# Patient Record
Sex: Male | Born: 1970 | Race: White | Hispanic: No | Marital: Single | Smoking: Current every day smoker
Health system: Southern US, Community
[De-identification: ages and names within clinical notes are randomized; demographics above are authoritative.]

## PROBLEM LIST (undated history)

## (undated) DIAGNOSIS — I10 Essential (primary) hypertension: Secondary | ICD-10-CM

## (undated) DIAGNOSIS — J449 Chronic obstructive pulmonary disease, unspecified: Secondary | ICD-10-CM

## (undated) DIAGNOSIS — F419 Anxiety disorder, unspecified: Secondary | ICD-10-CM

## (undated) DIAGNOSIS — G4730 Sleep apnea, unspecified: Secondary | ICD-10-CM

## (undated) DIAGNOSIS — F259 Schizoaffective disorder, unspecified: Secondary | ICD-10-CM

## (undated) DIAGNOSIS — F329 Major depressive disorder, single episode, unspecified: Secondary | ICD-10-CM

## (undated) DIAGNOSIS — R188 Other ascites: Secondary | ICD-10-CM

## (undated) DIAGNOSIS — F32A Depression, unspecified: Secondary | ICD-10-CM

## (undated) DIAGNOSIS — F319 Bipolar disorder, unspecified: Secondary | ICD-10-CM

## (undated) DIAGNOSIS — K859 Acute pancreatitis without necrosis or infection, unspecified: Secondary | ICD-10-CM

## (undated) DIAGNOSIS — N400 Enlarged prostate without lower urinary tract symptoms: Secondary | ICD-10-CM

## (undated) DIAGNOSIS — E079 Disorder of thyroid, unspecified: Secondary | ICD-10-CM

## (undated) DIAGNOSIS — E785 Hyperlipidemia, unspecified: Secondary | ICD-10-CM

## (undated) HISTORY — DX: Essential (primary) hypertension: I10

## (undated) HISTORY — DX: Schizoaffective disorder, unspecified: F25.9

## (undated) HISTORY — DX: Acute pancreatitis without necrosis or infection, unspecified: K85.90

## (undated) HISTORY — DX: Anxiety disorder, unspecified: F41.9

## (undated) HISTORY — DX: Hyperlipidemia, unspecified: E78.5

## (undated) HISTORY — DX: Depression, unspecified: F32.A

## (undated) HISTORY — PX: CYST REMOVAL HAND: SHX6279

## (undated) HISTORY — DX: Major depressive disorder, single episode, unspecified: F32.9

## (undated) HISTORY — PX: UPPER GASTROINTESTINAL ENDOSCOPY: SHX188

## (undated) HISTORY — DX: Bipolar disorder, unspecified: F31.9

## (undated) HISTORY — PX: ERCP: SHX60

## (undated) HISTORY — DX: Other ascites: R18.8

## (undated) HISTORY — DX: Disorder of thyroid, unspecified: E07.9

---

## 2000-02-09 ENCOUNTER — Emergency Department (HOSPITAL_COMMUNITY): Admission: EM | Admit: 2000-02-09 | Discharge: 2000-02-09 | Payer: Self-pay | Admitting: Emergency Medicine

## 2000-02-09 ENCOUNTER — Encounter: Payer: Self-pay | Admitting: Emergency Medicine

## 2000-07-06 HISTORY — PX: HERNIA REPAIR: SHX51

## 2009-11-03 ENCOUNTER — Emergency Department (HOSPITAL_COMMUNITY): Admission: EM | Admit: 2009-11-03 | Discharge: 2009-11-04 | Payer: Self-pay | Admitting: Emergency Medicine

## 2010-09-23 LAB — URINALYSIS, ROUTINE W REFLEX MICROSCOPIC
Bilirubin Urine: NEGATIVE
Glucose, UA: NEGATIVE mg/dL
Hgb urine dipstick: NEGATIVE
Ketones, ur: NEGATIVE mg/dL
Nitrite: NEGATIVE
Protein, ur: NEGATIVE mg/dL
Specific Gravity, Urine: 1.034 — ABNORMAL HIGH (ref 1.005–1.030)
Urobilinogen, UA: 1 mg/dL (ref 0.0–1.0)
pH: 5 (ref 5.0–8.0)

## 2010-09-23 LAB — URINE CULTURE
Colony Count: NO GROWTH
Culture: NO GROWTH

## 2011-07-07 DIAGNOSIS — I10 Essential (primary) hypertension: Secondary | ICD-10-CM

## 2011-07-07 HISTORY — DX: Essential (primary) hypertension: I10

## 2012-08-24 ENCOUNTER — Encounter (HOSPITAL_COMMUNITY): Payer: Self-pay | Admitting: *Deleted

## 2012-08-24 ENCOUNTER — Emergency Department (HOSPITAL_COMMUNITY)
Admission: EM | Admit: 2012-08-24 | Discharge: 2012-08-24 | Disposition: A | Discharge status: Home / Self Care | Payer: Self-pay | Attending: Emergency Medicine | Admitting: Emergency Medicine

## 2012-08-24 DIAGNOSIS — R1909 Other intra-abdominal and pelvic swelling, mass and lump: Secondary | ICD-10-CM | POA: Insufficient documentation

## 2012-08-24 DIAGNOSIS — G8929 Other chronic pain: Secondary | ICD-10-CM | POA: Insufficient documentation

## 2012-08-24 DIAGNOSIS — F17200 Nicotine dependence, unspecified, uncomplicated: Secondary | ICD-10-CM | POA: Insufficient documentation

## 2012-08-24 NOTE — ED Notes (Addendum)
Groin pain and lump x 1 year. Was seen at [facility] earlier today and referred to general surgeon. Unsure of what was wrong per pt.

## 2012-08-25 ENCOUNTER — Ambulatory Visit (HOSPITAL_COMMUNITY)
Admit: 2012-08-25 | Discharge: 2012-08-25 | Disposition: A | Discharge status: Home / Self Care | Payer: Self-pay | Source: Ambulatory Visit | Attending: Emergency Medicine | Admitting: Emergency Medicine

## 2012-08-25 DIAGNOSIS — R1032 Left lower quadrant pain: Secondary | ICD-10-CM | POA: Insufficient documentation

## 2012-08-25 DIAGNOSIS — R1904 Left lower quadrant abdominal swelling, mass and lump: Secondary | ICD-10-CM | POA: Insufficient documentation

## 2012-08-25 DIAGNOSIS — R1909 Other intra-abdominal and pelvic swelling, mass and lump: Secondary | ICD-10-CM

## 2012-08-25 NOTE — ED Provider Notes (Signed)
US Pelvis Limited  08/25/2012  *RADIOLOGY REPORT*  Clinical Data: Left groin nodule with tenderness.  LIMITED ULTRASOUND OF PELVIC SOFT TISSUES  Technique: Ultrasound examination of the pelvic soft tissues was performed in the area of clinical concern.  Comparison:  None.  Findings: Oblong nodules with hyperechoic hila are seen in the left groin, measuring up to 9 x 4 x 5 mm.  IMPRESSION: Small left inguinal lymph nodes.   Original Report Authenticated By: Leanna Battles, M.D.     Pt returning after ED visit for [place]. Results as above. Discussed with pt and provided with copy of reports. Says symptoms have been stable since DC. No new complaints. Emergent return precautions discussed. Outpt FU otherwise.   Raeford Razor, MD 08/25/12 1320

## 2012-08-31 NOTE — ED Provider Notes (Signed)
History     CSN: [id number]  Arrival date & time 08/24/12  1804   First MD Initiated Contact with Patient 08/24/12 2035      Chief Complaint  Patient presents with  . Groin Pain    (Consider location/radiation/quality/duration/timing/severity/associated sxs/prior treatment) HPI Comments: Yisroel Mullendore presents with a 1 year history of chronic pain and swelling in his left groin.  He was seen at [facility] earlier today and was referred to a general surgeon for further evaluation and states never received a diagnosis, which prompts his visit here today.  The area is tender with palpation and is without radiation.  He denies nausea, vomiting,  Fever, abdominal, scrotal or penile pain and has had no penile discharge or scrotal swelling.  He has found no alleviators,  But palpation and sometimes walking due to friction makes worse.   Patient is a 42 y.o. male presenting with groin pain. The history is provided by the patient.  Groin Pain Pertinent negatives include no abdominal pain, arthralgias, chest pain, congestion, fatigue, fever, headaches, joint swelling, nausea, neck pain, numbness, rash, sore throat, vomiting or weakness.    History reviewed. No pertinent past medical history.  Past Surgical History  Procedure Laterality Date  . Hernia repair      No family history on file.  History  Substance Use Topics  . Smoking status: Current Every Day Smoker  . Smokeless tobacco: Not on file  . Alcohol Use: No      Review of Systems  Constitutional: Negative for fever, appetite change, fatigue and unexpected weight change.  HENT: Negative for congestion, sore throat and neck pain.   Eyes: Negative.   Respiratory: Negative for chest tightness and shortness of breath.   Cardiovascular: Negative for chest pain.  Gastrointestinal: Negative for nausea, vomiting, abdominal pain, diarrhea, constipation and abdominal distention.  Genitourinary: Negative.  Negative for  dysuria, hematuria, flank pain, discharge, penile swelling, scrotal swelling, penile pain and testicular pain.  Musculoskeletal: Negative for joint swelling and arthralgias.  Skin: Negative.  Negative for rash and wound.  Neurological: Negative for dizziness, weakness, light-headedness, numbness and headaches.  Psychiatric/Behavioral: Negative.     Allergies  Desipramine  Home Medications  No current outpatient prescriptions on file.  BP 143/96  Pulse 71  Temp(Src) 97.8 F (36.6 C) (Oral)  Resp 18  Ht 5\' 10"  (1.778 m)  Wt 220 lb (99.791 kg)  BMI 31.57 kg/m2  SpO2 97%  Physical Exam  Nursing note and vitals reviewed. Constitutional: He appears well-developed and well-nourished.  HENT:  Head: Normocephalic and atraumatic.  Eyes: Conjunctivae are normal.  Neck: Normal range of motion.  Cardiovascular: Normal rate, regular rhythm, normal heart sounds and intact distal pulses.   Pulmonary/Chest: Effort normal and breath sounds normal. He has no wheezes.  Abdominal: Soft. Bowel sounds are normal. There is no tenderness. Hernia confirmed negative in the right inguinal area.  Genitourinary: Testes normal and penis normal. Right testis shows no mass and no tenderness. Left testis shows no mass and no tenderness. No penile tenderness.  Small,  Tender moveable mass left inguinal region suspicious for adenopathy. Not reducible,  No bowel sounds at site.    Musculoskeletal: Normal range of motion.  Neurological: He is alert.  Skin: Skin is warm and dry. No rash noted. No erythema.  Psychiatric: He has a normal mood and affect.    ED Course  Procedures (including critical care time)  Labs Reviewed - No data to display No results found.  1. Groin mass   2. Nodule of groin       MDM  Suspect adenopathy of left inguinal region of unclear etiology.  Discussed with Dr. Lynelle Doctor.  Scheduled pt for pelvic for further definition of nodule which will be helpful to the surgeon.  In the  interim,  He was encouraged to f/u with the surgical referral he was given by providers at [facility].  He was also given referral to Dr. Leticia Penna if he prefers to stay in [place].  Pt understands plan.        Burgess Amor, [place] 08/31/12 1129

## 2012-09-01 NOTE — ED Provider Notes (Signed)
Pt discussed with me, I felt [place] probably not helpful  Medical screening examination/treatment/procedure(s) were performed by non-physician practitioner and as supervising physician I was immediately available for consultation/collaboration.   Devoria Albe, MD, FACEP   Ward Givens, MD 09/01/12 [phone]

## 2012-09-11 ENCOUNTER — Emergency Department (HOSPITAL_COMMUNITY): Payer: Self-pay

## 2012-09-11 ENCOUNTER — Encounter (HOSPITAL_COMMUNITY): Payer: Self-pay

## 2012-09-11 ENCOUNTER — Emergency Department (HOSPITAL_COMMUNITY)
Admission: EM | Admit: 2012-09-11 | Discharge: 2012-09-11 | Disposition: A | Discharge status: Home / Self Care | Payer: Self-pay | Attending: Emergency Medicine | Admitting: Emergency Medicine

## 2012-09-11 DIAGNOSIS — Z9889 Other specified postprocedural states: Secondary | ICD-10-CM | POA: Insufficient documentation

## 2012-09-11 DIAGNOSIS — J449 Chronic obstructive pulmonary disease, unspecified: Secondary | ICD-10-CM | POA: Insufficient documentation

## 2012-09-11 DIAGNOSIS — F17200 Nicotine dependence, unspecified, uncomplicated: Secondary | ICD-10-CM | POA: Insufficient documentation

## 2012-09-11 DIAGNOSIS — J4489 Other specified chronic obstructive pulmonary disease: Secondary | ICD-10-CM | POA: Insufficient documentation

## 2012-09-11 DIAGNOSIS — Z87448 Personal history of other diseases of urinary system: Secondary | ICD-10-CM | POA: Insufficient documentation

## 2012-09-11 DIAGNOSIS — R110 Nausea: Secondary | ICD-10-CM | POA: Insufficient documentation

## 2012-09-11 DIAGNOSIS — M545 Low back pain, unspecified: Secondary | ICD-10-CM

## 2012-09-11 DIAGNOSIS — Z7982 Long term (current) use of aspirin: Secondary | ICD-10-CM | POA: Insufficient documentation

## 2012-09-11 HISTORY — DX: Chronic obstructive pulmonary disease, unspecified: J44.9

## 2012-09-11 HISTORY — DX: Benign prostatic hyperplasia without lower urinary tract symptoms: N40.0

## 2012-09-11 LAB — URINALYSIS, ROUTINE W REFLEX MICROSCOPIC
Glucose, UA: NEGATIVE mg/dL
Hgb urine dipstick: NEGATIVE
Ketones, ur: NEGATIVE mg/dL
Leukocytes, UA: NEGATIVE
Protein, ur: NEGATIVE mg/dL
Urobilinogen, UA: 0.2 mg/dL (ref 0.0–1.0)

## 2012-09-11 MED ORDER — HYDROCODONE-ACETAMINOPHEN 5-325 MG PO TABS: ORAL_TABLET | ORAL | Status: DC

## 2012-09-11 MED ORDER — NAPROXEN 250 MG PO TABS: 250.0000 mg | ORAL_TABLET | Freq: Two times a day (BID) | ORAL | Status: DC

## 2012-09-11 MED ORDER — OXYCODONE-ACETAMINOPHEN 5-325 MG PO TABS
2.0000 | ORAL_TABLET | Freq: Once | ORAL | Status: AC
  Administered 2012-09-11: 2 via ORAL
  Filled 2012-09-11: qty 2

## 2012-09-11 MED ORDER — METAXALONE 800 MG PO TABS: 800.0000 mg | ORAL_TABLET | Freq: Three times a day (TID) | ORAL | Status: DC | PRN

## 2012-09-11 NOTE — ED Notes (Signed)
Pt reports right flank pain that started today, denies any urinary s/s, +nausea at times, ?fever.

## 2012-09-11 NOTE — ED Provider Notes (Signed)
History     CSN: [id number]  Arrival date & time 09/11/12  1714   First MD Initiated Contact with Patient 09/11/12 1907      Chief Complaint  Patient presents with  . Flank Pain    HPI Pt was seen at 1920.   Per pt, c/o gradual onset and persistence of constant right sided flank "pain" that began this morning when he woke up.  Has been associated with mild nausea.  Describes the pain as "throbbing."  Worsens with palpation of the area and body position changes.  Denies dysuria/hematuria, no vomiting/diarrhea, no CP/SOB, no cough, no rash, no fevers, no testicular pain/swelling, no abd pain, no saddle anesthesia, no incont/retention of bowel/bladder, no focal motor weakness, no tingling/numbness in extremities.     Past Medical History  Diagnosis Date  . COPD (chronic obstructive pulmonary disease)   . Enlarged prostate     Past Surgical History  Procedure Laterality Date  . Hernia repair      History  Substance Use Topics  . Smoking status: Current Every Day Smoker    Types: Cigarettes  . Smokeless tobacco: Not on file  . Alcohol Use: No     Review of Systems ROS: Statement: All systems negative except as marked or noted in the HPI; Constitutional: Negative for fever and chills. ; ; Eyes: Negative for eye pain, redness and discharge. ; ; ENMT: Negative for ear pain, hoarseness, nasal congestion, sinus pressure and sore throat. ; ; Cardiovascular: Negative for chest pain, palpitations, diaphoresis, dyspnea and peripheral edema. ; ; Respiratory: Negative for cough, wheezing and stridor. ; ; Gastrointestinal: +nausea. Negative for vomiting, diarrhea, abdominal pain, blood in stool, hematemesis, jaundice and rectal bleeding. . ; ; Genitourinary: Negative for dysuria and hematuria. ; Genital:  No penile drainage or rash, no testicular pain or swelling, no scrotal rash or swelling.;; Musculoskeletal: +right flank pain. Negative for back pain and neck pain. Negative for swelling and  trauma.; ; Skin: Negative for pruritus, rash, abrasions, blisters, bruising and skin lesion.; ; Neuro: Negative for headache, lightheadedness and neck stiffness. Negative for weakness, altered level of consciousness , altered mental status, extremity weakness, paresthesias, involuntary movement, seizure and syncope.       Allergies  Desipramine  Home Medications   Current Outpatient Rx  Name  Route  Sig  Dispense  Refill  . aspirin 325 MG tablet   Oral   Take 650 mg by mouth once as needed for pain.           BP 141/105  Pulse 71  Temp(Src) 97.4 F (36.3 C) (Oral)  Resp 14  Ht 5\' 10"  (1.778 m)  SpO2 100%  Physical Exam 1925: Physical examination:  Nursing notes reviewed; Vital signs and O2 SAT reviewed;  Constitutional: Well developed, Well nourished, Well hydrated, In no acute distress; Head:  Normocephalic, atraumatic; Eyes: EOMI, PERRL, No scleral icterus; ENMT: Mouth and pharynx normal, Mucous membranes moist; Neck: Supple, Full range of motion, No lymphadenopathy; Cardiovascular: Regular rate and rhythm, No murmur, rub, or gallop; Respiratory: Breath sounds clear & equal bilaterally, No rales, rhonchi, wheezes.  Speaking full sentences with ease, Normal respiratory effort/excursion; Chest: Nontender, Movement normal; Abdomen: Soft, Nontender, Nondistended, Normal bowel sounds; Genitourinary: No CVA tenderness; Spine:  No midline CS, TS, LS tenderness.  +mild TTP right lumbar paraspinal muscles. No rash, no ecchymosis.;; Extremities: Pulses normal, No tenderness, No edema, No calf edema or asymmetry.; Neuro: AA&Ox3, Major CN grossly intact.  Speech clear. Climbs on  and off stretcher easily by himself. Gait steady. No gross focal motor or sensory deficits in extremities.; Skin: Color normal, Warm, Dry.   ED Course  Procedures     MDM  MDM Reviewed: nursing note, vitals and previous chart Interpretation: labs and CT scan   Results for orders placed during the hospital  encounter of 09/11/12  URINALYSIS, ROUTINE W REFLEX MICROSCOPIC      Result Value Range   Color, Urine YELLOW  YELLOW   APPearance CLEAR  CLEAR   Specific Gravity, Urine 1.025  1.005 - 1.030   pH 5.5  5.0 - 8.0   Glucose, UA NEGATIVE  NEGATIVE mg/dL   Hgb urine dipstick NEGATIVE  NEGATIVE   Bilirubin Urine NEGATIVE  NEGATIVE   Ketones, ur NEGATIVE  NEGATIVE mg/dL   Protein, ur NEGATIVE  NEGATIVE mg/dL   Urobilinogen, UA 0.2  0.0 - 1.0 mg/dL   Nitrite NEGATIVE  NEGATIVE   Leukocytes, UA NEGATIVE  NEGATIVE   Ct Abdomen Pelvis Wo Contrast 09/11/2012  *RADIOLOGY REPORT*  Clinical Data: Right flank pain  CT ABDOMEN AND PELVIS WITHOUT CONTRAST  Technique:  Multidetector CT imaging of the abdomen and pelvis was performed following the standard protocol without intravenous contrast. Sagittal and coronal MPR images reconstructed from axial data set.  Comparison: None  Findings: Lung bases clear. No urinary tract calcification, hydronephrosis or ureteral dilatation. Within limits of a nonenhanced exam, no focal abnormalities of the liver, spleen, pancreas, kidneys, or adrenal glands. Normal appendix, bladder and ureters. Right inguinal hernia containing fat. Stomach and bowel loops normal appearance for exam lacking IV and oral contrast. No mass, adenopathy, free fluid or inflammatory process. Old left transverse process fracture L3. No acute osseous findings.  IMPRESSION: No acute intra-abdominal or intrapelvic abnormalities. Right inguinal hernia containing fat.   Original Report Authenticated By: Ulyses Southward, M.D.     2110:  Improved after meds and wants to go home now.  No acute findings on workup; will tx symptomatically for now. Dx and testing d/w pt and family.  Questions answered.  Verb understanding, agreeable to d/c home with outpt f/u.          Laray Anger, DO 09/13/12 1319

## 2012-09-11 NOTE — ED Notes (Addendum)
Pt c/o right side flank pain that began this morning when he woke up. Pt describes pain as "throbbing". Pt denies pain anywhere else. Pt also denies changes in BM or urinary symptoms. Pt denies injury to area.

## 2012-09-13 LAB — URINE CULTURE
Colony Count: NO GROWTH
Culture: NO GROWTH

## 2012-10-16 ENCOUNTER — Emergency Department (HOSPITAL_COMMUNITY)
Admission: EM | Admit: 2012-10-16 | Discharge: 2012-10-16 | Disposition: A | Discharge status: Home / Self Care | Payer: Self-pay | Attending: Emergency Medicine | Admitting: Emergency Medicine

## 2012-10-16 ENCOUNTER — Encounter (HOSPITAL_COMMUNITY): Payer: Self-pay | Admitting: Emergency Medicine

## 2012-10-16 DIAGNOSIS — R1909 Other intra-abdominal and pelvic swelling, mass and lump: Secondary | ICD-10-CM

## 2012-10-16 DIAGNOSIS — J449 Chronic obstructive pulmonary disease, unspecified: Secondary | ICD-10-CM | POA: Insufficient documentation

## 2012-10-16 DIAGNOSIS — Z8639 Personal history of other endocrine, nutritional and metabolic disease: Secondary | ICD-10-CM | POA: Insufficient documentation

## 2012-10-16 DIAGNOSIS — R599 Enlarged lymph nodes, unspecified: Secondary | ICD-10-CM | POA: Insufficient documentation

## 2012-10-16 DIAGNOSIS — R1900 Intra-abdominal and pelvic swelling, mass and lump, unspecified site: Secondary | ICD-10-CM | POA: Insufficient documentation

## 2012-10-16 DIAGNOSIS — Z862 Personal history of diseases of the blood and blood-forming organs and certain disorders involving the immune mechanism: Secondary | ICD-10-CM | POA: Insufficient documentation

## 2012-10-16 DIAGNOSIS — R591 Generalized enlarged lymph nodes: Secondary | ICD-10-CM

## 2012-10-16 DIAGNOSIS — J4489 Other specified chronic obstructive pulmonary disease: Secondary | ICD-10-CM | POA: Insufficient documentation

## 2012-10-16 LAB — URINALYSIS, ROUTINE W REFLEX MICROSCOPIC
Leukocytes, UA: NEGATIVE
Protein, ur: NEGATIVE mg/dL
Specific Gravity, Urine: >1.03 — ABNORMAL HIGH (ref 1.005–1.030)
Urobilinogen, UA: 0.2 mg/dL (ref 0.0–1.0)

## 2012-10-16 LAB — POCT I-STAT, CHEM 8
HCT: 44 % (ref 39.0–52.0)
Hemoglobin: 15 g/dL (ref 13.0–17.0)
Potassium: 4 mEq/L (ref 3.5–5.1)
Sodium: 142 mEq/L (ref 135–145)
TCO2: 25 mmol/L (ref 0–100)

## 2012-10-16 MED ORDER — IBUPROFEN 800 MG PO TABS: 800.0000 mg | ORAL_TABLET | Freq: Three times a day (TID) | ORAL | Status: DC

## 2012-10-16 MED ORDER — IBUPROFEN 800 MG PO TABS
800.0000 mg | ORAL_TABLET | Freq: Once | ORAL | Status: AC
  Administered 2012-10-16: 800 mg via ORAL
  Filled 2012-10-16: qty 1

## 2012-10-16 NOTE — ED Notes (Signed)
Dr Manus Gunning in room assessing pt at this time.

## 2012-10-16 NOTE — ED Notes (Signed)
Patient states he was seen a month ago for groin pain and was supposed to follow up with a surgeon; patient states he is waiting on assistance from [facility] and has not followed up.  Patient continues to have left groin pain.

## 2012-10-16 NOTE — ED Provider Notes (Signed)
History  This chart was scribed for Glynn Octave, MD by Greggory Stallion, ED Scribe and Bennett Scrape, ED Scribe. This patient was seen in room [facility]14/[facility]14 and the patient's care was started at 7:49 PM.  CSN: [id number]  Arrival date & time 10/16/12  7829   First MD Initiated Contact with Patient 10/16/12 1944       Chief Complaint  Patient presents with  . Groin Pain    The history is provided by the patient. No language interpreter was used.    Steve Romero is a 42 y.o. male who presents to the Emergency Department complaining of 2 to 3 months of gradual onset, gradually worsening, constant left groin pain that intermittently radiates to the right with associated dysuria and urgency. He reports that he has been taking ASA at home with no relief. He was seen one month ago for the same and told to follow up with a surgeon which he states he has not done yet. He reports that he was not given a clear diagnosis for his symptoms but states that an US showed lymphadenopathy in the left groin. He denies back pain, testicular pain, emesis, abdominal pain, and constipation. He denies having a h/o DM. He does have a h/o COPD and is a current everyday smoker but denies alcohol use.  Pt denies having a PCP.   Past Medical History  Diagnosis Date  . COPD (chronic obstructive pulmonary disease)   . Enlarged prostate     Past Surgical History  Procedure Laterality Date  . Hernia repair      No family history on file.  History  Substance Use Topics  . Smoking status: Current Every Day Smoker    Types: Cigarettes  . Smokeless tobacco: Not on file  . Alcohol Use: No      Review of Systems  A complete 10 system review of systems was obtained and all systems are negative except as noted in the HPI and PMH.   Allergies  Desipramine  Home Medications   Current Outpatient Rx  Name  Route  Sig  Dispense  Refill  . aspirin 325 MG tablet   Oral   Take 650 mg by mouth once as  needed for pain.         Marland Kitchen HYDROcodone-acetaminophen (NORCO/VICODIN) 5-325 MG per tablet      1 or 2 tabs PO q6 hours prn pain   20 tablet   0   . metaxalone (SKELAXIN) 800 MG tablet   Oral   Take 1 tablet (800 mg total) by mouth 3 (three) times daily as needed for pain (muscle spasm).   15 tablet   0   . naproxen (NAPROSYN) 250 MG tablet   Oral   Take 1 tablet (250 mg total) by mouth 2 (two) times daily with a meal.   14 tablet   0     Triage Vitals: BP 164/89  Pulse 77  Temp(Src) 97.8 F (36.6 C) (Oral)  Resp 22  Ht 5\' 10"  (1.778 m)  Wt 225 lb (102.059 kg)  BMI 32.28 kg/m2  SpO2 98%  Physical Exam  Nursing note and vitals reviewed. Constitutional: He is oriented to person, place, and time. He appears well-developed and well-nourished. No distress.  HENT:  Head: Normocephalic and atraumatic.  Eyes: Conjunctivae and EOM are normal. Pupils are equal, round, and reactive to light.  Neck: Normal range of motion. Neck supple. No tracheal deviation present.  Cardiovascular: Normal rate, regular rhythm and normal heart  sounds.   No arrythmia   Pulmonary/Chest: Effort normal. No respiratory distress.  Abdominal: Soft. There is no tenderness.  Genitourinary:  No testicular tenderness, 0.5 cm firm nodule in the left inguinal region that's mobile  Musculoskeletal: Normal range of motion.  No CVA tenderness  Neurological: He is alert and oriented to person, place, and time.  Skin: Skin is warm and dry.  Psychiatric: He has a normal mood and affect. His behavior is normal.    ED Course  Procedures (including critical care time)  DIAGNOSTIC STUDIES: Oxygen Saturation is 98% on RA, normal by my interpretation.    COORDINATION OF CARE: 7:52 PM-Advised pt that he will most likely not need antibiotics. Discussed treatment plan which includes UA and I-Stat chem 8 with pt at bedside and pt agreed to plan.   Labs Reviewed  URINALYSIS, ROUTINE W REFLEX MICROSCOPIC   No  results found.   No diagnosis found.    MDM  Pain in L groin at palpable nodule.  Ongoing for months.  Previous evaluation with CT and [place].  No fever, abdominal pain, vomiting, urinary symptoms.  Examine consistent with lymph node. No erythema, fluctuance, skin changes. No abdominal or testicular tenderness. Reviewed previous US and CT. Supportive care. Followup with urology and PCP as previously instructed.  I personally performed the services described in this documentation, which was scribed in my presence. The recorded information has been reviewed and is accurate.   Glynn Octave, MD 10/17/12 1122

## 2012-10-16 NOTE — ED Notes (Signed)
Complaints of lower groin pain, states was told he might have a swollen lymph node and is to follow up with a surgeon, states he cannot afford it and that the pain continues to bother him. No other complaints.

## 2012-10-16 NOTE — ED Notes (Signed)
Pt has not been able to urinate, is going to attempt again at this time

## 2012-12-13 ENCOUNTER — Emergency Department (HOSPITAL_COMMUNITY)
Admission: EM | Admit: 2012-12-13 | Discharge: 2012-12-13 | Disposition: A | Discharge status: Home / Self Care | Payer: Self-pay | Attending: Emergency Medicine | Admitting: Emergency Medicine

## 2012-12-13 ENCOUNTER — Encounter (HOSPITAL_COMMUNITY): Payer: Self-pay | Admitting: Emergency Medicine

## 2012-12-13 DIAGNOSIS — J4489 Other specified chronic obstructive pulmonary disease: Secondary | ICD-10-CM | POA: Insufficient documentation

## 2012-12-13 DIAGNOSIS — R599 Enlarged lymph nodes, unspecified: Secondary | ICD-10-CM | POA: Insufficient documentation

## 2012-12-13 DIAGNOSIS — R590 Localized enlarged lymph nodes: Secondary | ICD-10-CM

## 2012-12-13 DIAGNOSIS — Z888 Allergy status to other drugs, medicaments and biological substances status: Secondary | ICD-10-CM | POA: Insufficient documentation

## 2012-12-13 DIAGNOSIS — F17200 Nicotine dependence, unspecified, uncomplicated: Secondary | ICD-10-CM | POA: Insufficient documentation

## 2012-12-13 DIAGNOSIS — Z87448 Personal history of other diseases of urinary system: Secondary | ICD-10-CM | POA: Insufficient documentation

## 2012-12-13 DIAGNOSIS — J449 Chronic obstructive pulmonary disease, unspecified: Secondary | ICD-10-CM | POA: Insufficient documentation

## 2012-12-13 LAB — POCT I-STAT, CHEM 8
Chloride: 109 mEq/L (ref 96–112)
Creatinine, Ser: 1 mg/dL (ref 0.50–1.35)
Hemoglobin: 16 g/dL (ref 13.0–17.0)
Potassium: 3.9 mEq/L (ref 3.5–5.1)
Sodium: 144 mEq/L (ref 135–145)

## 2012-12-13 LAB — CBC WITH DIFFERENTIAL/PLATELET
Eosinophils Absolute: 0.2 10*3/uL (ref 0.0–0.7)
Hemoglobin: 15.8 g/dL (ref 13.0–17.0)
Lymphocytes Relative: 36 % (ref 12–46)
Lymphs Abs: 3.4 10*3/uL (ref 0.7–4.0)
Neutro Abs: 5.3 10*3/uL (ref 1.7–7.7)
Neutrophils Relative %: 56 % (ref 43–77)
Platelets: 228 10*3/uL (ref 150–400)
RBC: 5.13 MIL/uL (ref 4.22–5.81)
WBC: 9.5 10*3/uL (ref 4.0–10.5)

## 2012-12-13 MED ORDER — HYDROCODONE-ACETAMINOPHEN 5-325 MG PO TABS: ORAL_TABLET | ORAL | Status: DC

## 2012-12-13 MED ORDER — HYDROCODONE-ACETAMINOPHEN 5-325 MG PO TABS
1.0000 | ORAL_TABLET | Freq: Once | ORAL | Status: AC
  Administered 2012-12-13: 1 via ORAL
  Filled 2012-12-13: qty 1

## 2012-12-13 NOTE — ED Notes (Addendum)
Pt alert oriented X4, CO left groin pain and knot that has increased in size and pain over past few months.  Pt states he had inguinal hernia repaired several years ago. Pt denies fever, reports slight diarrhea, denies vomiting.  Pain increases with activity.  Pain 7/10 dull throb increasing to sharp pain.

## 2012-12-13 NOTE — ED Provider Notes (Signed)
History     CSN: [id number]  Arrival date & time 12/13/12  7829   First MD Initiated Contact with Patient 12/13/12 1117      Chief Complaint  Patient presents with  . Abdominal Pain    (Consider location/radiation/quality/duration/timing/severity/associated sxs/prior treatment) HPI Comments: Patient with history of left hernia repair greater than 10 years ago presents with complaint of left groin pain, several previous ED visits for same. Patient notes a small lump in his left groin that is been there for several months. The area has become more painful recently over the past 2 days. Pain does not change with movement. Nothing makes the pain better or worse. He has been taking Tylenol and aspirin without relief. Patient has had diarrhea over the past 2 days. He is passing gas. No nausea or vomiting. No fever. No urinary symptoms or changes. Patient has had previous CT and ultrasound. Ultrasound showed lymph node only. Onset of symptoms gradual. Course is constant. Nothing makes symptoms better.  Patient is a 42 y.o. male presenting with abdominal pain. The history is provided by the patient and medical records.  Abdominal Pain Pertinent negatives include no abdominal pain, chest pain, coughing, fever, headaches, myalgias, nausea, rash, sore throat or vomiting.    Past Medical History  Diagnosis Date  . COPD (chronic obstructive pulmonary disease)   . Enlarged prostate     Past Surgical History  Procedure Laterality Date  . Hernia repair      History reviewed. No pertinent family history.  History  Substance Use Topics  . Smoking status: Current Every Day Smoker    Types: Cigarettes  . Smokeless tobacco: Not on file  . Alcohol Use: No      Review of Systems  Constitutional: Negative for fever.  HENT: Negative for sore throat and rhinorrhea.   Eyes: Negative for redness.  Respiratory: Negative for cough.   Cardiovascular: Negative for chest pain.  Gastrointestinal:  Negative for nausea, vomiting, abdominal pain and diarrhea.  Genitourinary: Negative for dysuria, flank pain, penile swelling, scrotal swelling and genital sores.       Groin pain  Musculoskeletal: Negative for myalgias.  Skin: Negative for rash.  Neurological: Negative for headaches.  Hematological: Positive for adenopathy.    Allergies  Desipramine  Home Medications   Current Outpatient Rx  Name  Route  Sig  Dispense  Refill  . aspirin 325 MG tablet   Oral   Take 650 mg by mouth once as needed for pain.         Marland Kitchen HYDROcodone-acetaminophen (NORCO/VICODIN) 5-325 MG per tablet      Take 1-2 tablets every 6 hours as needed for severe pain   9 tablet   0     BP 141/99  Pulse 72  Temp(Src) 97.9 F (36.6 C) (Oral)  Resp 18  SpO2 97%  Physical Exam  Nursing note and vitals reviewed. Constitutional: He appears well-developed and well-nourished.  HENT:  Head: Normocephalic and atraumatic.  Eyes: Conjunctivae are normal. Right eye exhibits no discharge. Left eye exhibits no discharge.  Neck: Normal range of motion. Neck supple.  Cardiovascular: Normal rate, regular rhythm and normal heart sounds.   Pulmonary/Chest: Effort normal and breath sounds normal.  Abdominal: Soft. There is no tenderness.  Genitourinary: Testes normal and penis normal.     Neurological: He is alert.  Skin: Skin is warm and dry.  Psychiatric: He has a normal mood and affect.    ED Course  Procedures (including critical care  time)  Labs Reviewed  POCT I-STAT, CHEM 8 - Abnormal; Notable for the following:    Glucose, Bld 103 (*)    All other components within normal limits  CBC WITH DIFFERENTIAL  URINALYSIS, ROUTINE W REFLEX MICROSCOPIC   No results found.   1. Inguinal lymphadenopathy    12:26 PM Patient seen and examined. Previous records including CT/US reviewed.  Medications ordered.   Vital signs reviewed and are as follows: Filed Vitals:   12/13/12 1126  BP: 141/99  Pulse:  72  Temp: 97.9 F (36.6 C)  Resp: 18   Will discharge with surgery referral for 2nd opinion as patient has h/o hernia repair of that side. Doubt these are related.   Patient counseled on use of narcotic pain medications. Counseled not to combine these medications with others containing tylenol. Urged not to drink alcohol, drive, or perform any other activities that requires focus while taking these medications. The patient verbalizes understanding and agrees with the plan.  MDM  Patient with left groin pain, history of left inguinal hernia repair. This is been a chronic problem for the patient and he has had several ED visits for the same. He has had a CT scan which did not show any emergent conditions. He has had an ultrasound of the area which showed lymph node. I suspect that the mass is a lymph node and not a hernia. No evidence of infection. No evidence of incarcerated or estrangulated hernia. Patient will need to follow up with primary care physician or surgeon for second opinion. No emergent conditions at this time. Patient stable for discharge.        Renne Crigler, PA-C 12/13/12 1231

## 2012-12-13 NOTE — ED Provider Notes (Signed)
Medical screening examination/treatment/procedure(s) were performed by non-physician practitioner and as supervising physician I was immediately available for consultation/collaboration.    Nelia Shi, MD 12/13/12 [phone]

## 2012-12-13 NOTE — ED Notes (Signed)
Pt arrives c/o painful knot in left groin area. States left inguinal hernia repair several years ago. States lump has been growing bigger over the last 6 months. Pt reports area just became painful over the last couple months. Pt reports some diarrhea yesterday. Denies fever, nasuea, vomiting. Rating pain 7/10. States at times pain increases.

## 2013-07-01 ENCOUNTER — Encounter (HOSPITAL_COMMUNITY): Payer: Self-pay | Admitting: Emergency Medicine

## 2013-07-01 ENCOUNTER — Emergency Department (HOSPITAL_COMMUNITY)
Admission: EM | Admit: 2013-07-01 | Discharge: 2013-07-01 | Disposition: A | Discharge status: Home / Self Care | Payer: Self-pay | Attending: Emergency Medicine | Admitting: Emergency Medicine

## 2013-07-01 DIAGNOSIS — Z87448 Personal history of other diseases of urinary system: Secondary | ICD-10-CM | POA: Insufficient documentation

## 2013-07-01 DIAGNOSIS — M65839 Other synovitis and tenosynovitis, unspecified forearm: Secondary | ICD-10-CM | POA: Insufficient documentation

## 2013-07-01 DIAGNOSIS — F17200 Nicotine dependence, unspecified, uncomplicated: Secondary | ICD-10-CM | POA: Insufficient documentation

## 2013-07-01 DIAGNOSIS — J4489 Other specified chronic obstructive pulmonary disease: Secondary | ICD-10-CM | POA: Insufficient documentation

## 2013-07-01 DIAGNOSIS — J449 Chronic obstructive pulmonary disease, unspecified: Secondary | ICD-10-CM | POA: Insufficient documentation

## 2013-07-01 DIAGNOSIS — M778 Other enthesopathies, not elsewhere classified: Secondary | ICD-10-CM

## 2013-07-01 MED ORDER — DICLOFENAC SODIUM 75 MG PO TBEC: 75.0000 mg | DELAYED_RELEASE_TABLET | Freq: Two times a day (BID) | ORAL | Status: DC

## 2013-07-01 MED ORDER — DEXAMETHASONE 4 MG PO TABS: ORAL_TABLET | ORAL | Status: DC

## 2013-07-01 MED ORDER — KETOROLAC TROMETHAMINE 10 MG PO TABS
10.0000 mg | ORAL_TABLET | Freq: Once | ORAL | Status: AC
  Administered 2013-07-01: 10 mg via ORAL
  Filled 2013-07-01: qty 1

## 2013-07-01 MED ORDER — PREDNISONE 50 MG PO TABS
60.0000 mg | ORAL_TABLET | Freq: Once | ORAL | Status: AC
  Administered 2013-07-01: 60 mg via ORAL
  Filled 2013-07-01 (×2): qty 1

## 2013-07-01 NOTE — ED Notes (Signed)
Pt c/o pain to r wrist that has gotten worse since last night.  Denies injury.  Reports has had cyst on wrist for over 1 year.

## 2013-07-01 NOTE — ED Provider Notes (Signed)
Medical screening examination/treatment/procedure(s) were performed by non-physician practitioner and as supervising physician I was immediately available for consultation/collaboration.  EKG Interpretation   None        Donnetta Hutching, MD 07/01/13 [phone]

## 2013-07-01 NOTE — ED Provider Notes (Signed)
CSN: [id number]     Arrival date & time 07/01/13  1019 History   First MD Initiated Contact with Patient 07/01/13 1020     Chief Complaint  Patient presents with  . Wrist Pain   (Consider location/radiation/quality/duration/timing/severity/associated sxs/prior Treatment) Patient is a 42 y.o. male presenting with wrist pain. The history is provided by the patient.  Wrist Pain This is a new problem. The current episode started yesterday. The problem occurs constantly. The problem has been gradually worsening. Pertinent negatives include no fever, numbness or weakness. Exacerbated by: movement. He has tried nothing for the symptoms. The treatment provided no relief.    Past Medical History  Diagnosis Date  . COPD (chronic obstructive pulmonary disease)   . Enlarged prostate    Past Surgical History  Procedure Laterality Date  . Hernia repair     No family history on file. History  Substance Use Topics  . Smoking status: Current Every Day Smoker    Types: Cigarettes  . Smokeless tobacco: Not on file  . Alcohol Use: No    Review of Systems  Constitutional: Negative for fever.  Neurological: Negative for weakness and numbness.    Allergies  Desipramine  Home Medications   Current Outpatient Rx  Name  Route  Sig  Dispense  Refill  . aspirin 325 MG tablet   Oral   Take 650 mg by mouth once as needed for pain.         Marland Kitchen dexamethasone (DECADRON) 4 MG tablet      1 po bid with food   12 tablet   0   . diclofenac (VOLTAREN) 75 MG EC tablet   Oral   Take 1 tablet (75 mg total) by mouth 2 (two) times daily.   12 tablet   0   . HYDROcodone-acetaminophen (NORCO/VICODIN) 5-325 MG per tablet      Take 1-2 tablets every 6 hours as needed for severe pain   9 tablet   0    BP 132/92  Pulse 86  Temp(Src) 98 F (36.7 C) (Oral)  Resp 18  Ht 5\' 9"  (1.753 m)  Wt 215 lb (97.523 kg)  BMI 31.74 kg/m2  SpO2 98% Physical Exam  Nursing note and vitals  reviewed. Constitutional: He is oriented to person, place, and time. He appears well-developed and well-nourished.  Non-toxic appearance.  HENT:  Head: Normocephalic.  Right Ear: Tympanic membrane and external ear normal.  Left Ear: Tympanic membrane and external ear normal.  Eyes: EOM and lids are normal. Pupils are equal, round, and reactive to light.  Neck: Normal range of motion. Neck supple. Carotid bruit is not present.  Cardiovascular: Normal rate, regular rhythm, normal heart sounds, intact distal pulses and normal pulses.   Pulmonary/Chest: Breath sounds normal. No respiratory distress.  Abdominal: Soft. Bowel sounds are normal. There is no tenderness. There is no guarding.  Musculoskeletal: Normal range of motion.  There is full range of motion of the right shoulder and elbow. There is pain with pronation and supination of the right wrist. There no hot joints appreciated on the right upper extremity. There is full range of motion of the right hand. The capillary refill is less than 2 seconds.  Lymphadenopathy:       Head (right side): No submandibular adenopathy present.       Head (left side): No submandibular adenopathy present.    He has no cervical adenopathy.  Neurological: He is alert and oriented to person, place, and time. He  has normal strength. No cranial nerve deficit or sensory deficit.  Skin: Skin is warm and dry.  Psychiatric: He has a normal mood and affect. His speech is normal.    ED Course  Procedures (including critical care time) Labs Review Labs Reviewed - No data to display Imaging Review No results found.  EKG Interpretation   None       MDM   1. Tendonitis of wrist, right    **I have reviewed nursing notes, vital signs, and all appropriate lab and imaging results for this patient.*  Patient has a tendinitis of the right wrist. The patient is fitted with a splint. He will be treated with Decadron and diclofenac. Patient advised to see the  orthopedic specialist if not improving.  Kathie Dike, PA-C 07/01/13 1134

## 2013-07-01 NOTE — Progress Notes (Signed)
  ED/CM noted patient did not have health insurance and/or PCP listed in the computer.  Patient was given the [facility] with information on the clinics, food pantries, and the handout for new health insurance sign-up.  Patient inquired about charity program and was wanting to talk with the Artist. Patient stated that was told by someone about it.  Patient will contact financial counselor regarding an assistance program.  Patient stated he filled out paperwork for something and never heard back, and also stated medicaid for [facility] was also looked into. Will contact financial counselor provided number to contact. Patient expressed appreciation for information received.

## 2014-07-06 DIAGNOSIS — E785 Hyperlipidemia, unspecified: Secondary | ICD-10-CM

## 2014-07-06 DIAGNOSIS — J449 Chronic obstructive pulmonary disease, unspecified: Secondary | ICD-10-CM

## 2014-07-06 HISTORY — DX: Chronic obstructive pulmonary disease, unspecified: J44.9

## 2014-07-06 HISTORY — DX: Hyperlipidemia, unspecified: E78.5

## 2014-10-05 DIAGNOSIS — F419 Anxiety disorder, unspecified: Secondary | ICD-10-CM

## 2014-10-05 HISTORY — DX: Anxiety disorder, unspecified: F41.9

## 2014-10-24 ENCOUNTER — Emergency Department (HOSPITAL_COMMUNITY)
Admission: EM | Admit: 2014-10-24 | Discharge: 2014-10-24 | Discharge status: Against Medical Advice | Payer: Self-pay | Attending: Emergency Medicine | Admitting: Emergency Medicine

## 2014-10-24 ENCOUNTER — Encounter (HOSPITAL_COMMUNITY): Payer: Self-pay

## 2014-10-24 DIAGNOSIS — G4700 Insomnia, unspecified: Secondary | ICD-10-CM | POA: Insufficient documentation

## 2014-10-24 DIAGNOSIS — J449 Chronic obstructive pulmonary disease, unspecified: Secondary | ICD-10-CM | POA: Insufficient documentation

## 2014-10-24 DIAGNOSIS — Z87448 Personal history of other diseases of urinary system: Secondary | ICD-10-CM | POA: Insufficient documentation

## 2014-10-24 DIAGNOSIS — Z7982 Long term (current) use of aspirin: Secondary | ICD-10-CM | POA: Insufficient documentation

## 2014-10-24 DIAGNOSIS — F331 Major depressive disorder, recurrent, moderate: Secondary | ICD-10-CM | POA: Insufficient documentation

## 2014-10-24 DIAGNOSIS — Z720 Tobacco use: Secondary | ICD-10-CM | POA: Insufficient documentation

## 2014-10-24 DIAGNOSIS — K029 Dental caries, unspecified: Secondary | ICD-10-CM | POA: Insufficient documentation

## 2014-10-24 LAB — CBC WITH DIFFERENTIAL/PLATELET
BASOS ABS: 0.1 10*3/uL (ref 0.0–0.1)
Basophils Relative: 1 % (ref 0–1)
EOS PCT: 2 % (ref 0–5)
Eosinophils Absolute: 0.2 10*3/uL (ref 0.0–0.7)
HEMATOCRIT: 51.2 % (ref 39.0–52.0)
Hemoglobin: 18 g/dL — ABNORMAL HIGH (ref 13.0–17.0)
LYMPHS ABS: 4 10*3/uL (ref 0.7–4.0)
LYMPHS PCT: 36 % (ref 12–46)
MCH: 31.5 pg (ref 26.0–34.0)
MCHC: 35.2 g/dL (ref 30.0–36.0)
MCV: 89.7 fL (ref 78.0–100.0)
Monocytes Absolute: 0.6 10*3/uL (ref 0.1–1.0)
Monocytes Relative: 5 % (ref 3–12)
NEUTROS ABS: 6.3 10*3/uL (ref 1.7–7.7)
Neutrophils Relative %: 56 % (ref 43–77)
PLATELETS: 239 10*3/uL (ref 150–400)
RBC: 5.71 MIL/uL (ref 4.22–5.81)
RDW: 13 % (ref 11.5–15.5)
WBC: 11.2 10*3/uL — AB (ref 4.0–10.5)

## 2014-10-24 LAB — COMPREHENSIVE METABOLIC PANEL
ALBUMIN: 4 g/dL (ref 3.5–5.2)
ALK PHOS: 119 U/L — AB (ref 39–117)
ALT: 23 U/L (ref 0–53)
AST: 16 U/L (ref 0–37)
Anion gap: 7 (ref 5–15)
BILIRUBIN TOTAL: 0.3 mg/dL (ref 0.3–1.2)
BUN: 14 mg/dL (ref 6–23)
CHLORIDE: 107 mmol/L (ref 96–112)
CO2: 29 mmol/L (ref 19–32)
Calcium: 8.9 mg/dL (ref 8.4–10.5)
Creatinine, Ser: 1.18 mg/dL (ref 0.50–1.35)
GFR calc Af Amer: 86 mL/min — ABNORMAL LOW (ref >90)
GFR, EST NON AFRICAN AMERICAN: 74 mL/min — AB (ref >90)
Glucose, Bld: 99 mg/dL (ref 70–99)
POTASSIUM: 4.3 mmol/L (ref 3.5–5.1)
Sodium: 143 mmol/L (ref 135–145)
Total Protein: 6.7 g/dL (ref 6.0–8.3)

## 2014-10-24 LAB — URINALYSIS, ROUTINE W REFLEX MICROSCOPIC
BILIRUBIN URINE: NEGATIVE
Glucose, UA: NEGATIVE mg/dL
HGB URINE DIPSTICK: NEGATIVE
Ketones, ur: NEGATIVE mg/dL
Leukocytes, UA: NEGATIVE
Nitrite: NEGATIVE
PROTEIN: NEGATIVE mg/dL
Specific Gravity, Urine: >1.03 — ABNORMAL HIGH (ref 1.005–1.030)
UROBILINOGEN UA: 0.2 mg/dL (ref 0.0–1.0)
pH: 5.5 (ref 5.0–8.0)

## 2014-10-24 LAB — TSH: TSH: 1.748 u[IU]/mL (ref 0.350–4.500)

## 2014-10-24 LAB — RAPID URINE DRUG SCREEN, HOSP PERFORMED
AMPHETAMINES: NOT DETECTED
Barbiturates: NOT DETECTED
Benzodiazepines: NOT DETECTED
COCAINE: NOT DETECTED
OPIATES: NOT DETECTED
TETRAHYDROCANNABINOL: POSITIVE — AB

## 2014-10-24 NOTE — ED Provider Notes (Signed)
CSN: [id number]     Arrival date & time 10/24/14  0845 History   First MD Initiated Contact with Patient 10/24/14 [id number]     Chief Complaint  Patient presents with  . Insomnia  . Anxiety     (Consider location/radiation/quality/duration/timing/severity/associated sxs/prior Treatment) HPI Keylon Labelle is a 44 y.o. male who presents to the ED for difficulty sleeping for the past 3 months. He reports that for the past 3 days he has not slept much at all and when he does he wakes feeling like someone is holding him down. He fell like he is having a panic attack and feeling that someone is in his house. He has trouble leaving the house due to anxiety. He denies medical problems or feeling of SI or HI.   Past Medical History  Diagnosis Date  . COPD (chronic obstructive pulmonary disease)   . Enlarged prostate    Past Surgical History  Procedure Laterality Date  . Hernia repair     No family history on file. History  Substance Use Topics  . Smoking status: Current Every Day Smoker    Types: Cigarettes  . Smokeless tobacco: Not on file  . Alcohol Use: No    Review of Systems Negative except as stated in HPI   Allergies  Desipramine  Home Medications   Prior to Admission medications   Medication Sig Start Date End Date Taking? Authorizing Provider  aspirin 325 MG tablet Take 650 mg by mouth once as needed for pain.   Yes Historical Provider, MD  dexamethasone (DECADRON) 4 MG tablet 1 po bid with food Patient not taking: Reported on 10/24/2014 07/01/13   Lily Kocher, PA-C  dexamethasone (DECADRON) 4 MG tablet 1 po bid with food Patient not taking: Reported on 10/24/2014 07/01/13   Lily Kocher, PA-C  diclofenac (VOLTAREN) 75 MG EC tablet Take 1 tablet (75 mg total) by mouth 2 (two) times daily. Patient not taking: Reported on 10/24/2014 07/01/13   Lily Kocher, PA-C  diclofenac (VOLTAREN) 75 MG EC tablet Take 1 tablet (75 mg total) by mouth 2 (two) times daily. Patient not  taking: Reported on 10/24/2014 07/01/13   Lily Kocher, PA-C  HYDROcodone-acetaminophen (NORCO/VICODIN) 5-325 MG per tablet Take 1-2 tablets every 6 hours as needed for severe pain Patient not taking: Reported on 10/24/2014 12/13/12   Carlisle Cater, PA-C   BP 145/82 mmHg  Pulse 78  Temp(Src) 97.9 F (36.6 C) (Oral)  Resp 18  Ht 5\' 10"  (1.778 m)  Wt 215 lb (97.523 kg)  BMI 30.85 kg/m2  SpO2 99% Physical Exam  Constitutional: He is oriented to person, place, and time. He appears well-developed and well-nourished. No distress.  HENT:  Head: Normocephalic.  Mouth/Throat: Uvula is midline, oropharynx is clear and moist and mucous membranes are normal. Dental caries present.  Eyes: Conjunctivae and EOM are normal. Pupils are equal, round, and reactive to light.  Neck: Normal range of motion. Neck supple.  Cardiovascular: Normal rate.   Pulmonary/Chest: Effort normal. He has no wheezes. He has no rales.  Abdominal: Soft. There is no tenderness.  Musculoskeletal: Normal range of motion.  Neurological: He is alert and oriented to person, place, and time. No cranial nerve deficit.  Skin: Skin is warm and dry.  Psychiatric: His speech is normal. Thought content normal. He exhibits a depressed mood.  Nursing note and vitals reviewed.   ED Course  Procedures (including critical care time) Labs Review Results for orders placed or performed during the hospital encounter  of 10/24/14 (from the past 24 hour(s))  CBC with Differential     Status: Abnormal   Collection Time: 10/24/14 10:15 AM  Result Value Ref Range   WBC 11.2 (H) 4.0 - 10.5 K/uL   RBC 5.71 4.22 - 5.81 MIL/uL   Hemoglobin 18.0 (H) 13.0 - 17.0 g/dL   HCT 51.2 39.0 - 52.0 %   MCV 89.7 78.0 - 100.0 fL   MCH 31.5 26.0 - 34.0 pg   MCHC 35.2 30.0 - 36.0 g/dL   RDW 13.0 11.5 - 15.5 %   Platelets 239 150 - 400 K/uL   Neutrophils Relative % 56 43 - 77 %   Neutro Abs 6.3 1.7 - 7.7 K/uL   Lymphocytes Relative 36 12 - 46 %   Lymphs  Abs 4.0 0.7 - 4.0 K/uL   Monocytes Relative 5 3 - 12 %   Monocytes Absolute 0.6 0.1 - 1.0 K/uL   Eosinophils Relative 2 0 - 5 %   Eosinophils Absolute 0.2 0.0 - 0.7 K/uL   Basophils Relative 1 0 - 1 %   Basophils Absolute 0.1 0.0 - 0.1 K/uL  Comprehensive metabolic panel     Status: Abnormal   Collection Time: 10/24/14 10:15 AM  Result Value Ref Range   Sodium 143 135 - 145 mmol/L   Potassium 4.3 3.5 - 5.1 mmol/L   Chloride 107 96 - 112 mmol/L   CO2 29 19 - 32 mmol/L   Glucose, Bld 99 70 - 99 mg/dL   BUN 14 6 - 23 mg/dL   Creatinine, Ser 1.18 0.50 - 1.35 mg/dL   Calcium 8.9 8.4 - 10.5 mg/dL   Total Protein 6.7 6.0 - 8.3 g/dL   Albumin 4.0 3.5 - 5.2 g/dL   AST 16 0 - 37 U/L   ALT 23 0 - 53 U/L   Alkaline Phosphatase 119 (H) 39 - 117 U/L   Total Bilirubin 0.3 0.3 - 1.2 mg/dL   GFR calc non Af Amer 74 (L) >90 mL/min   GFR calc Af Amer 86 (L) >90 mL/min   Anion gap 7 5 - 15  TSH     Status: None   Collection Time: 10/24/14 10:15 AM  Result Value Ref Range   TSH 1.748 0.350 - 4.500 uIU/mL  Urine rapid drug screen (hosp performed)     Status: Abnormal   Collection Time: 10/24/14 11:36 AM  Result Value Ref Range   Opiates NONE DETECTED NONE DETECTED   Cocaine NONE DETECTED NONE DETECTED   Benzodiazepines NONE DETECTED NONE DETECTED   Amphetamines NONE DETECTED NONE DETECTED   Tetrahydrocannabinol POSITIVE (A) NONE DETECTED   Barbiturates NONE DETECTED NONE DETECTED  Urinalysis, Routine w reflex microscopic     Status: Abnormal   Collection Time: 10/24/14 11:36 AM  Result Value Ref Range   Color, Urine YELLOW YELLOW   APPearance CLEAR CLEAR   Specific Gravity, Urine >1.030 (H) 1.005 - 1.030   pH 5.5 5.0 - 8.0   Glucose, UA NEGATIVE NEGATIVE mg/dL   Hgb urine dipstick NEGATIVE NEGATIVE   Bilirubin Urine NEGATIVE NEGATIVE   Ketones, ur NEGATIVE NEGATIVE mg/dL   Protein, ur NEGATIVE NEGATIVE mg/dL   Urobilinogen, UA 0.2 0.0 - 1.0 mg/dL   Nitrite NEGATIVE NEGATIVE    Leukocytes, UA NEGATIVE NEGATIVE    Consult with Behavioral Health and they will do tele psych.   MDM  44 y.o. male with difficulty sleeping x 3 months and reports not sleeping at all in 3  days. Feeling of not wanting to leave home and feeling like someone is there when no one is there but him.  Awaiting placement for Behavioral Health. See note  Final diagnoses:  Major depressive disorder, recurrent episode, moderate      [facility], NP 10/24/14 1531  Davonna Belling, MD 10/24/14 1535

## 2014-10-24 NOTE — ED Notes (Signed)
PT c/o sleeplessness x3 months with no sleep x3 days. PT states increased anxiety with panic attacks this week with fear of going in public. PT denies any SI/HI.

## 2014-10-24 NOTE — ED Notes (Signed)
PT eloped ED at this time. PT had denied any SI/HI while in the department. [place] made aware.
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
## 2014-10-24 NOTE — BH Assessment (Signed)
[place] Assessment Progress Note    Called and scheduled pt's tele assessment with this clinician.  Also, gathered clinical information on the pt from [facility], NP at 1028.  Shaune Pascal, MS, [facility] Therapeutic Triage Specialist [facility]

## 2014-10-24 NOTE — ED Notes (Signed)
Tele psych in progress at this time. 

## 2014-10-24 NOTE — ED Notes (Signed)
Pt reports has had difficulty sleeping for the past 3 months.  Reports hasn't slept in the past 3 days and has been having anxiety attacks.  Pt says when he goes to sleep, feels like someone is holding him down.  Also says feels like someone is in the room with him.  Pt says recently hasn't wanted to leave his house due to anxiety.   Denies SI or Hi.

## 2014-11-04 DIAGNOSIS — E079 Disorder of thyroid, unspecified: Secondary | ICD-10-CM

## 2014-11-04 HISTORY — DX: Disorder of thyroid, unspecified: E07.9

## 2014-11-13 DIAGNOSIS — F259 Schizoaffective disorder, unspecified: Secondary | ICD-10-CM

## 2014-11-13 HISTORY — DX: Schizoaffective disorder, unspecified: F25.9

## 2015-04-22 ENCOUNTER — Ambulatory Visit: Payer: Self-pay | Admitting: Physician Assistant

## 2015-04-22 ENCOUNTER — Encounter: Payer: Self-pay | Admitting: Physician Assistant

## 2015-04-22 VITALS — BP 134/86 | HR 93 | Temp 97.5°F | Ht 68.25 in | Wt 248.0 lb

## 2015-04-22 DIAGNOSIS — E039 Hypothyroidism, unspecified: Secondary | ICD-10-CM | POA: Insufficient documentation

## 2015-04-22 DIAGNOSIS — E1169 Type 2 diabetes mellitus with other specified complication: Secondary | ICD-10-CM | POA: Insufficient documentation

## 2015-04-22 DIAGNOSIS — E1159 Type 2 diabetes mellitus with other circulatory complications: Secondary | ICD-10-CM | POA: Insufficient documentation

## 2015-04-22 DIAGNOSIS — F411 Generalized anxiety disorder: Secondary | ICD-10-CM | POA: Insufficient documentation

## 2015-04-22 DIAGNOSIS — E785 Hyperlipidemia, unspecified: Secondary | ICD-10-CM | POA: Insufficient documentation

## 2015-04-22 DIAGNOSIS — R591 Generalized enlarged lymph nodes: Secondary | ICD-10-CM | POA: Insufficient documentation

## 2015-04-22 DIAGNOSIS — Z131 Encounter for screening for diabetes mellitus: Secondary | ICD-10-CM

## 2015-04-22 DIAGNOSIS — I10 Essential (primary) hypertension: Secondary | ICD-10-CM | POA: Insufficient documentation

## 2015-04-22 DIAGNOSIS — R609 Edema, unspecified: Secondary | ICD-10-CM | POA: Insufficient documentation

## 2015-04-22 DIAGNOSIS — F17210 Nicotine dependence, cigarettes, uncomplicated: Secondary | ICD-10-CM | POA: Insufficient documentation

## 2015-04-22 DIAGNOSIS — F259 Schizoaffective disorder, unspecified: Secondary | ICD-10-CM | POA: Insufficient documentation

## 2015-04-22 DIAGNOSIS — I152 Hypertension secondary to endocrine disorders: Secondary | ICD-10-CM | POA: Insufficient documentation

## 2015-04-22 LAB — GLUCOSE, POCT (MANUAL RESULT ENTRY): POC Glucose: 103 mg/dl — AB (ref 70–99)

## 2015-04-22 NOTE — Patient Instructions (Signed)
Smoking Cessation, Tips for Success If you are ready to quit smoking, congratulations! You have chosen to help yourself be healthier. Cigarettes bring nicotine, tar, carbon monoxide, and other irritants into your body. Your lungs, heart, and blood vessels will be able to work better without these poisons. There are many different ways to quit smoking. Nicotine gum, nicotine patches, a nicotine inhaler, or nicotine nasal spray can help with physical craving. Hypnosis, support groups, and medicines help break the habit of smoking. WHAT THINGS CAN I DO TO MAKE QUITTING EASIER?  Here are some tips to help you quit for good:  Pick a date when you will quit smoking completely. Tell all of your friends and family about your plan to quit on that date.  Do not try to slowly cut down on the number of cigarettes you are smoking. Pick a quit date and quit smoking completely starting on that day.  Throw away all cigarettes.   Clean and remove all ashtrays from your home, work, and car.  On a card, write down your reasons for quitting. Carry the card with you and read it when you get the urge to smoke.  Cleanse your body of nicotine. Drink enough water and fluids to keep your urine clear or pale yellow. Do this after quitting to flush the nicotine from your body.  Learn to predict your moods. Do not let a bad situation be your excuse to have a cigarette. Some situations in your life might tempt you into wanting a cigarette.  Never have "just one" cigarette. It leads to wanting another and another. Remind yourself of your decision to quit.  Change habits associated with smoking. If you smoked while driving or when feeling stressed, try other activities to replace smoking. Stand up when drinking your coffee. Brush your teeth after eating. Sit in a different chair when you read the paper. Avoid alcohol while trying to quit, and try to drink fewer caffeinated beverages. Alcohol and caffeine may urge you to  smoke.  Avoid foods and drinks that can trigger a desire to smoke, such as sugary or spicy foods and alcohol.  Ask people who smoke not to smoke around you.  Have something planned to do right after eating or having a cup of coffee. For example, plan to take a walk or exercise.  Try a relaxation exercise to calm you down and decrease your stress. Remember, you may be tense and nervous for the first 2 weeks after you quit, but this will pass.  Find new activities to keep your hands busy. Play with a pen, coin, or rubber band. Doodle or draw things on paper.  Brush your teeth right after eating. This will help cut down on the craving for the taste of tobacco after meals. You can also try mouthwash.   Use oral substitutes in place of cigarettes. Try using lemon drops, carrots, cinnamon sticks, or chewing gum. Keep them handy so they are available when you have the urge to smoke.  When you have the urge to smoke, try deep breathing.  Designate your home as a nonsmoking area.  If you are a heavy smoker, ask your health care provider about a prescription for nicotine chewing gum. It can ease your withdrawal from nicotine.  Reward yourself. Set aside the cigarette money you save and buy yourself something nice.  Look for support from others. Join a support group or smoking cessation program. Ask someone at home or at work to help you with your plan   to quit smoking.  Always ask yourself, "Do I need this cigarette or is this just a reflex?" Tell yourself, "Today, I choose not to smoke," or "I do not want to smoke." You are reminding yourself of your decision to quit.  Do not replace cigarette smoking with electronic cigarettes (commonly called e-cigarettes). The safety of e-cigarettes is unknown, and some may contain harmful chemicals.  If you relapse, do not give up! Plan ahead and think about what you will do the next time you get the urge to smoke. HOW WILL I FEEL WHEN I QUIT SMOKING? You  may have symptoms of withdrawal because your body is used to nicotine (the addictive substance in cigarettes). You may crave cigarettes, be irritable, feel very hungry, cough often, get headaches, or have difficulty concentrating. The withdrawal symptoms are only temporary. They are strongest when you first quit but will go away within 10-14 days. When withdrawal symptoms occur, stay in control. Think about your reasons for quitting. Remind yourself that these are signs that your body is healing and getting used to being without cigarettes. Remember that withdrawal symptoms are easier to treat than the major diseases that smoking can cause.  Even after the withdrawal is over, expect periodic urges to smoke. However, these cravings are generally short lived and will go away whether you smoke or not. Do not smoke! WHAT RESOURCES ARE AVAILABLE TO HELP ME QUIT SMOKING? Your health care provider can direct you to community resources or hospitals for support, which may include:  Group support.  Education.  Hypnosis.  Therapy.   This information is not intended to replace advice given to you by your health care provider. Make sure you discuss any questions you have with your health care provider.   Document Released: 03/20/2004 Document Revised: 07/13/2014 Document Reviewed: 12/08/2012 Elsevier Interactive Patient Education 2016 Elsevier Inc.  

## 2015-04-22 NOTE — Progress Notes (Signed)
BP 134/86 mmHg  Pulse 93  Temp(Src) 97.5 F (36.4 C)  Ht 5' 8.25" (1.734 m)  Wt 248 lb (112.492 kg)  BMI 37.41 kg/m2  SpO2 95%   Subjective:    Patient ID: Steve Romero, male    DOB: Apr 02, 1971, 44 y.o.   MRN: [id number]  HPI: Steve Romero is a 44 y.o. male presenting on 04/22/2015 for New Patient (Initial Visit)   HPI   Chief Complaint  Patient presents with  . New Patient (Initial Visit)    need cholesterol and thyroid med.     Pt states he has been out of his meds for several months.  Previously he went to [facility] in [place]  Pt currently goes to [facility] for schizoaffective d/o and antiety.  Pt states hx L groin lymph node 2014 - went to ER 3 times for it. He says it is still there.  Relevant past medical, surgical, family and social history reviewed and updated as indicated. Interim medical history since our last visit reviewed. Allergies and medications reviewed and updated.  Current outpatient prescriptions:  .  aspirin 325 MG tablet, Take 650 mg by mouth once as needed for pain., Disp: , Rfl:  .  citalopram (CELEXA) 20 MG tablet, Take 20 mg by mouth daily. For mood, Disp: , Rfl:  .  haloperidol (HALDOL) 0.5 MG tablet, Take 0.5 mg by mouth daily., Disp: , Rfl:  .  TRAZODONE HCL PO, Take 200 mg by mouth at bedtime. For insomnia, Disp: , Rfl:   Review of Systems  Constitutional: Positive for appetite change and fatigue. Negative for fever, chills, diaphoresis and unexpected weight change.  HENT: Positive for dental problem and hearing loss. Negative for congestion, drooling, ear pain, facial swelling, mouth sores, sneezing, sore throat, trouble swallowing and voice change.   Eyes: Positive for visual disturbance. Negative for pain, discharge, redness and itching.  Respiratory: Positive for shortness of breath and wheezing. Negative for cough and choking.   Cardiovascular: Positive for chest pain. Negative for palpitations and leg swelling.  Gastrointestinal:  Negative for vomiting, abdominal pain, diarrhea, constipation and blood in stool.  Endocrine: Positive for cold intolerance. Negative for heat intolerance and polydipsia.  Genitourinary: Positive for decreased urine volume. Negative for dysuria and hematuria.  Musculoskeletal: Positive for back pain and arthralgias. Negative for gait problem.  Skin: Negative for rash.  Allergic/Immunologic: Negative for environmental allergies.  Neurological: Negative for seizures, syncope, light-headedness and headaches.  Hematological: Negative for adenopathy.  Psychiatric/Behavioral: Positive for dysphoric mood and agitation. Negative for suicidal ideas. The patient is nervous/anxious.     Per HPI unless specifically indicated above     Objective:    BP 134/86 mmHg  Pulse 93  Temp(Src) 97.5 F (36.4 C)  Ht 5' 8.25" (1.734 m)  Wt 248 lb (112.492 kg)  BMI 37.41 kg/m2  SpO2 95%  Wt Readings from Last 3 Encounters:  04/22/15 248 lb (112.492 kg)  10/24/14 215 lb (97.523 kg)  07/01/13 215 lb (97.523 kg)    Physical Exam  Constitutional: He is oriented to person, place, and time. He appears well-developed and well-nourished.  HENT:  Head: Normocephalic and atraumatic.  Mouth/Throat: Oropharynx is clear and moist. No trismus in the jaw. Abnormal dentition (dentition poor with extensive decay). Dental caries present. No dental abscesses or uvula swelling. No oropharyngeal exudate.  Eyes: Conjunctivae and EOM are normal. Pupils are equal, round, and reactive to light.  Neck: Neck supple. No thyromegaly present.  Cardiovascular: Normal rate  and regular rhythm.   Pulmonary/Chest: Effort normal and breath sounds normal. He has no wheezes. He has no rales.  Abdominal: Soft. Bowel sounds are normal. He exhibits no mass. There is no tenderness.  Musculoskeletal: He exhibits edema.  Lymphadenopathy:    He has no cervical adenopathy.    He has no axillary adenopathy.       Right: No inguinal and no  supraclavicular adenopathy present.       Left: Inguinal (approx just larger than 1 cm soft mobile nontender) adenopathy present. No supraclavicular adenopathy present.  Neurological: He is alert and oriented to person, place, and time. Gait normal.  Skin: Skin is warm and dry. No rash noted.  Psychiatric: He has a normal mood and affect. His behavior is normal. Thought content normal.  Vitals reviewed.   Results for orders placed or performed in visit on 04/22/15  POCT Glucose (CBG)  Result Value Ref Range   POC Glucose 103 (A) 70 - 99 mg/dl   + BLE edema     Assessment & Plan:   Encounter Diagnoses  Name Primary?  . Essential hypertension Yes  . Hyperlipemia   . Hypothyroidism, unspecified hypothyroidism type   . Schizoaffective disorder, unspecified type ([place])   . Anxiety state   . Lymphadenopathy   . Edema, unspecified type   . Screening for diabetes mellitus   . Cigarette nicotine dependence without complication        -Get fasting labs drawn- will send rx  lovastatin and levothyroxine (previously on 25mcg) to [facility] when get results -will monitor bp/recheck at f/u OV -gave [facility] discount application.  Will repeat US lymph node -Will plan on getting echo- order at f/u OV if has [facility] discount- to evaluate edema -Counseled on Smoking cessation -F/u 1 mo

## 2015-04-23 LAB — CBC WITH DIFFERENTIAL/PLATELET
BASOS ABS: 0.1 10*3/uL (ref 0.0–0.1)
Basophils Relative: 1 % (ref 0–1)
Eosinophils Absolute: 0.2 10*3/uL (ref 0.0–0.7)
Eosinophils Relative: 3 % (ref 0–5)
HEMATOCRIT: 48.1 % (ref 39.0–52.0)
Hemoglobin: 16.7 g/dL (ref 13.0–17.0)
LYMPHS ABS: 2.6 10*3/uL (ref 0.7–4.0)
LYMPHS PCT: 38 % (ref 12–46)
MCH: 31 pg (ref 26.0–34.0)
MCHC: 34.7 g/dL (ref 30.0–36.0)
MCV: 89.4 fL (ref 78.0–100.0)
MPV: 10.5 fL (ref 8.6–12.4)
Monocytes Absolute: 0.6 10*3/uL (ref 0.1–1.0)
Monocytes Relative: 8 % (ref 3–12)
NEUTROS PCT: 50 % (ref 43–77)
Neutro Abs: 3.5 10*3/uL (ref 1.7–7.7)
Platelets: 224 10*3/uL (ref 150–400)
RBC: 5.38 MIL/uL (ref 4.22–5.81)
RDW: 13.6 % (ref 11.5–15.5)
WBC: 6.9 10*3/uL (ref 4.0–10.5)

## 2015-04-23 LAB — COMPLETE METABOLIC PANEL WITH GFR
ALT: 26 U/L (ref 9–46)
AST: 15 U/L (ref 10–40)
Albumin: 3.8 g/dL (ref 3.6–5.1)
Alkaline Phosphatase: 102 U/L (ref 40–115)
BUN: 14 mg/dL (ref 7–25)
CALCIUM: 9.2 mg/dL (ref 8.6–10.3)
CHLORIDE: 107 mmol/L (ref 98–110)
CO2: 31 mmol/L (ref 20–31)
CREATININE: 1.02 mg/dL (ref 0.60–1.35)
GFR, EST NON AFRICAN AMERICAN: 89 mL/min (ref >=60)
GFR, Est African American: >89 mL/min (ref >=60)
Glucose, Bld: 99 mg/dL (ref 65–99)
POTASSIUM: 5 mmol/L (ref 3.5–5.3)
Sodium: 141 mmol/L (ref 135–146)
Total Bilirubin: 0.4 mg/dL (ref 0.2–1.2)
Total Protein: 5.9 g/dL — ABNORMAL LOW (ref 6.1–8.1)

## 2015-04-23 LAB — LIPID PANEL
CHOL/HDL RATIO: 6.9 ratio — AB (ref <=5.0)
CHOLESTEROL: 192 mg/dL (ref 125–200)
HDL: 28 mg/dL — AB (ref >=40)
LDL CALC: 118 mg/dL (ref <130)
TRIGLYCERIDES: 231 mg/dL — AB (ref <150)
VLDL: 46 mg/dL — ABNORMAL HIGH (ref <30)

## 2015-04-23 LAB — TSH: TSH: 1.954 u[IU]/mL (ref 0.350–4.500)

## 2015-05-28 ENCOUNTER — Ambulatory Visit: Payer: Self-pay | Admitting: Physician Assistant

## 2015-05-28 ENCOUNTER — Encounter: Payer: Self-pay | Admitting: Physician Assistant

## 2015-05-28 VITALS — BP 134/96 | HR 79 | Temp 97.9°F | Ht 68.25 in | Wt 245.5 lb

## 2015-05-28 DIAGNOSIS — F17210 Nicotine dependence, cigarettes, uncomplicated: Secondary | ICD-10-CM

## 2015-05-28 DIAGNOSIS — R609 Edema, unspecified: Secondary | ICD-10-CM

## 2015-05-28 DIAGNOSIS — E785 Hyperlipidemia, unspecified: Secondary | ICD-10-CM

## 2015-05-28 MED ORDER — LOVASTATIN 20 MG PO TABS: 20.0000 mg | ORAL_TABLET | Freq: Every day | ORAL | Status: DC

## 2015-05-28 NOTE — Progress Notes (Signed)
BP 134/96 mmHg  Pulse 79  Temp(Src) 97.9 F (36.6 C)  Ht 5' 8.25" (1.734 m)  Wt 245 lb 8 oz (111.358 kg)  BMI 37.04 kg/m2  SpO2 95%   Subjective:    Patient ID: Steve Romero, male    DOB: 07-13-1970, 44 y.o.   MRN: [id number]  HPI: Steve Romero is a 44 y.o. male presenting on 05/28/2015 for Hypertension   HPI  Relevant past medical, surgical, family and social history reviewed and updated as indicated. Interim medical history since our last visit reviewed. Allergies and medications reviewed and updated.   CURRENT MEDS: Aspirin 386m qd Citalopram 288mqd Haldol 0.6m38md Trazodone 200m81ms   Review of Systems  Constitutional: Positive for appetite change. Negative for fever, chills, diaphoresis, fatigue and unexpected weight change.  HENT: Positive for dental problem and hearing loss. Negative for congestion, drooling, ear pain, facial swelling, mouth sores, sneezing, sore throat, trouble swallowing and voice change.   Eyes: Negative for pain, discharge, redness, itching and visual disturbance.  Respiratory: Positive for shortness of breath and wheezing. Negative for cough and choking.   Cardiovascular: Negative for chest pain, palpitations and leg swelling.  Gastrointestinal: Negative for vomiting, abdominal pain, diarrhea, constipation and blood in stool.  Endocrine: Negative for cold intolerance, heat intolerance and polydipsia.  Genitourinary: Positive for decreased urine volume. Negative for dysuria and hematuria.  Musculoskeletal: Positive for back pain. Negative for arthralgias and gait problem.  Skin: Negative for rash.  Allergic/Immunologic: Negative for environmental allergies.  Neurological: Negative for seizures, syncope, light-headedness and headaches.  Hematological: Positive for adenopathy.  Psychiatric/Behavioral: Positive for dysphoric mood. Negative for suicidal ideas and agitation. The patient is nervous/anxious.     Per HPI unless specifically  indicated above     Objective:    BP 134/96 mmHg  Pulse 79  Temp(Src) 97.9 F (36.6 C)  Ht 5' 8.25" (1.734 m)  Wt 245 lb 8 oz (111.358 kg)  BMI 37.04 kg/m2  SpO2 95%  Wt Readings from Last 3 Encounters:  05/28/15 245 lb 8 oz (111.358 kg)  04/22/15 248 lb (112.492 kg)  10/24/14 215 lb (97.523 kg)    Physical Exam  Constitutional: He is oriented to person, place, and time. He appears well-developed and well-nourished.  HENT:  Head: Normocephalic and atraumatic.  Neck: Neck supple.  Cardiovascular: Normal rate and regular rhythm.   No murmur heard. Pulmonary/Chest: Effort normal and breath sounds normal. He has no wheezes.  Abdominal: Soft. Bowel sounds are normal. There is no tenderness.  Musculoskeletal: He exhibits edema.  Lymphadenopathy:    He has no cervical adenopathy.  Neurological: He is alert and oriented to person, place, and time.  Skin: Skin is warm and dry.  Psychiatric: He has a normal mood and affect. His behavior is normal.  Vitals reviewed.   Results for orders placed or performed in visit on 04/22/15  TSH  Result Value Ref Range   TSH 1.954 0.350 - 4.500 uIU/mL  Lipid Profile  Result Value Ref Range   Cholesterol 192 125 - 200 mg/dL   Triglycerides 231 (H) <150 mg/dL   HDL 28 (L) >=40 mg/dL   Total CHOL/HDL Ratio 6.9 (H) <=5.0 Ratio   VLDL 46 (H) <30 mg/dL   LDL Cholesterol 118 <130 mg/dL  COMPLETE METABOLIC PANEL WITH GFR  Result Value Ref Range   Sodium 141 135 - 146 mmol/L   Potassium 5.0 3.5 - 5.3 mmol/L   Chloride 107 98 - 110 mmol/L  CO2 31 20 - 31 mmol/L   Glucose, Bld 99 65 - 99 mg/dL   BUN 14 7 - 25 mg/dL   Creat 1.02 0.60 - 1.35 mg/dL   Total Bilirubin 0.4 0.2 - 1.2 mg/dL   Alkaline Phosphatase 102 40 - 115 U/L   AST 15 10 - 40 U/L   ALT 26 9 - 46 U/L   Total Protein 5.9 (L) 6.1 - 8.1 g/dL   Albumin 3.8 3.6 - 5.1 g/dL   Calcium 9.2 8.6 - 10.3 mg/dL   GFR, Est African American >89 >=60 mL/min   GFR, Est Non African American 89  >=60 mL/min  CBC w/Diff/Platelet  Result Value Ref Range   WBC 6.9 4.0 - 10.5 K/uL   RBC 5.38 4.22 - 5.81 MIL/uL   Hemoglobin 16.7 13.0 - 17.0 g/dL   HCT 48.1 39.0 - 52.0 %   MCV 89.4 78.0 - 100.0 fL   MCH 31.0 26.0 - 34.0 pg   MCHC 34.7 30.0 - 36.0 g/dL   RDW 13.6 11.5 - 15.5 %   Platelets 224 150 - 400 K/uL   MPV 10.5 8.6 - 12.4 fL   Neutrophils Relative % 50 43 - 77 %   Neutro Abs 3.5 1.7 - 7.7 K/uL   Lymphocytes Relative 38 12 - 46 %   Lymphs Abs 2.6 0.7 - 4.0 K/uL   Monocytes Relative 8 3 - 12 %   Monocytes Absolute 0.6 0.1 - 1.0 K/uL   Eosinophils Relative 3 0 - 5 %   Eosinophils Absolute 0.2 0.0 - 0.7 K/uL   Basophils Relative 1 0 - 1 %   Basophils Absolute 0.1 0.0 - 0.1 K/uL   Smear Review Criteria for review not met   POCT Glucose (CBG)  Result Value Ref Range   POC Glucose 103 (A) 70 - 99 mg/dl      Assessment & Plan:   Encounter Diagnoses  Name Primary?  . Hyperlipemia Yes  . Edema, unspecified type   . Cigarette nicotine dependence, uncomplicated     -reviewed labs with pt- restart lovastatin for lipids.  Do not need levothyroxine as tsh normal -pt turned in his [facility] discount application.  He hasn't heard if he was approved- needs echo to evaluate edema  -reviewed [place] and CT from 2014 which show that lymph node not changed and no other worrisome findings -counseled smoking cessation -bp borderline.  Will monitor -f/u 3 mo. rto sooner prn

## 2015-05-29 ENCOUNTER — Ambulatory Visit (HOSPITAL_COMMUNITY)
Admission: RE | Admit: 2015-05-29 | Discharge: 2015-05-29 | Disposition: A | Discharge status: Home / Self Care | Payer: Medicaid Other | Source: Ambulatory Visit | Attending: Physician Assistant | Admitting: Physician Assistant

## 2015-05-29 DIAGNOSIS — I517 Cardiomegaly: Secondary | ICD-10-CM | POA: Diagnosis not present

## 2015-05-29 DIAGNOSIS — R609 Edema, unspecified: Secondary | ICD-10-CM

## 2015-05-29 DIAGNOSIS — I351 Nonrheumatic aortic (valve) insufficiency: Secondary | ICD-10-CM | POA: Insufficient documentation

## 2015-06-29 ENCOUNTER — Emergency Department (HOSPITAL_COMMUNITY)
Admission: EM | Admit: 2015-06-29 | Discharge: 2015-06-29 | Disposition: A | Discharge status: Home / Self Care | Payer: Medicaid Other | Attending: Emergency Medicine | Admitting: Emergency Medicine

## 2015-06-29 ENCOUNTER — Encounter (HOSPITAL_COMMUNITY): Payer: Self-pay | Admitting: *Deleted

## 2015-06-29 DIAGNOSIS — F17210 Nicotine dependence, cigarettes, uncomplicated: Secondary | ICD-10-CM | POA: Diagnosis not present

## 2015-06-29 DIAGNOSIS — Y9289 Other specified places as the place of occurrence of the external cause: Secondary | ICD-10-CM | POA: Diagnosis not present

## 2015-06-29 DIAGNOSIS — F419 Anxiety disorder, unspecified: Secondary | ICD-10-CM | POA: Insufficient documentation

## 2015-06-29 DIAGNOSIS — Y998 Other external cause status: Secondary | ICD-10-CM | POA: Insufficient documentation

## 2015-06-29 DIAGNOSIS — Z7982 Long term (current) use of aspirin: Secondary | ICD-10-CM | POA: Diagnosis not present

## 2015-06-29 DIAGNOSIS — F319 Bipolar disorder, unspecified: Secondary | ICD-10-CM | POA: Insufficient documentation

## 2015-06-29 DIAGNOSIS — S4992XA Unspecified injury of left shoulder and upper arm, initial encounter: Secondary | ICD-10-CM | POA: Diagnosis present

## 2015-06-29 DIAGNOSIS — I10 Essential (primary) hypertension: Secondary | ICD-10-CM | POA: Insufficient documentation

## 2015-06-29 DIAGNOSIS — S46312A Strain of muscle, fascia and tendon of triceps, left arm, initial encounter: Secondary | ICD-10-CM

## 2015-06-29 DIAGNOSIS — Y9389 Activity, other specified: Secondary | ICD-10-CM | POA: Insufficient documentation

## 2015-06-29 DIAGNOSIS — Z79899 Other long term (current) drug therapy: Secondary | ICD-10-CM | POA: Diagnosis not present

## 2015-06-29 DIAGNOSIS — J449 Chronic obstructive pulmonary disease, unspecified: Secondary | ICD-10-CM | POA: Diagnosis not present

## 2015-06-29 DIAGNOSIS — Z87438 Personal history of other diseases of male genital organs: Secondary | ICD-10-CM | POA: Insufficient documentation

## 2015-06-29 DIAGNOSIS — E785 Hyperlipidemia, unspecified: Secondary | ICD-10-CM | POA: Diagnosis not present

## 2015-06-29 DIAGNOSIS — X58XXXA Exposure to other specified factors, initial encounter: Secondary | ICD-10-CM | POA: Diagnosis not present

## 2015-06-29 MED ORDER — DICLOFENAC SODIUM 75 MG PO TBEC: 75.0000 mg | DELAYED_RELEASE_TABLET | Freq: Two times a day (BID) | ORAL | Status: DC

## 2015-06-29 MED ORDER — BACLOFEN 10 MG PO TABS: 10.0000 mg | ORAL_TABLET | Freq: Three times a day (TID) | ORAL | Status: DC

## 2015-06-29 NOTE — Discharge Instructions (Signed)
Your examination favors a bicep tricep strain. Please use the sling over the next 3 or 4 days. Use baclofen and diclofenac as prescribed. Baclofen may cause drowsiness, please do not drive, operate machinery, drink alcohol, or pertussis patient activities requiring concentration when taking this medication. Please see Dr. Aline Brochure for orthopedic evaluation concerning your injury if not improving. Muscle Strain A muscle strain is an injury that occurs when a muscle is stretched beyond its normal length. Usually a small number of muscle fibers are torn when this happens. Muscle strain is rated in degrees. First-degree strains have the least amount of muscle fiber tearing and pain. Second-degree and third-degree strains have increasingly more tearing and pain.  Usually, recovery from muscle strain takes 1-2 weeks. Complete healing takes 5-6 weeks.  CAUSES  Muscle strain happens when a sudden, violent force placed on a muscle stretches it too far. This may occur with lifting, sports, or a fall.  RISK FACTORS Muscle strain is especially common in athletes.  SIGNS AND SYMPTOMS At the site of the muscle strain, there may be:  Pain.  Bruising.  Swelling.  Difficulty using the muscle due to pain or lack of normal function. DIAGNOSIS  Your health care provider will perform a physical exam and ask about your medical history. TREATMENT  Often, the best treatment for a muscle strain is resting, icing, and applying cold compresses to the injured area.  HOME CARE INSTRUCTIONS   Use the PRICE method of treatment to promote muscle healing during the first 2-3 days after your injury. The PRICE method involves:  Protecting the muscle from being injured again.  Restricting your activity and resting the injured body part.  Icing your injury. To do this, put ice in a plastic bag. Place a towel between your skin and the bag. Then, apply the ice and leave it on from 15-20 minutes each hour. After the third  day, switch to moist heat packs.  Apply compression to the injured area with a splint or elastic bandage. Be careful not to wrap it too tightly. This may interfere with blood circulation or increase swelling.  Elevate the injured body part above the level of your heart as often as you can.  Only take over-the-counter or prescription medicines for pain, discomfort, or fever as directed by your health care provider.  Warming up prior to exercise helps to prevent future muscle strains. SEEK MEDICAL CARE IF:   You have increasing pain or swelling in the injured area.  You have numbness, tingling, or a significant loss of strength in the injured area. MAKE SURE YOU:   Understand these instructions.  Will watch your condition.  Will get help right away if you are not doing well or get worse.   This information is not intended to replace advice given to you by your health care provider. Make sure you discuss any questions you have with your health care provider.   Document Released: 06/22/2005 Document Revised: 04/12/2013 Document Reviewed: 01/19/2013 Elsevier Interactive Patient Education [employer].

## 2015-06-29 NOTE — ED Notes (Signed)
Left arm pain onest 3-4 days ago, describes pain as a throbb

## 2015-06-29 NOTE — ED Provider Notes (Signed)
CSN: [id number]     Arrival date & time 06/29/15  1509 History   First MD Initiated Contact with Patient 06/29/15 1533     Chief Complaint  Patient presents with  . Extremity Pain     (Consider location/radiation/quality/duration/timing/severity/associated sxs/prior Treatment) Patient is a 44 y.o. male presenting with extremity pain. The history is provided by the patient.  Extremity Pain This is a new problem. The current episode started in the past 7 days. The problem has been gradually worsening. Associated symptoms include coughing. Pertinent negatives include no joint swelling or numbness. Exacerbated by: lifting and movement. He has tried heat and position changes for the symptoms. The treatment provided no relief.    Past Medical History  Diagnosis Date  . Enlarged prostate   . Schizoaffective disorder ([place]) 11-13-14  . Bipolar disorder ([place]) age 68  . Anxiety 10-2014  . COPD (chronic obstructive pulmonary disease) ([place]) 2016  . Depression age 50  . Hyperlipidemia 2016  . Hypertension 2013  . Thyroid disease 11-2014   Past Surgical History  Procedure Laterality Date  . Hernia repair  2002   Family History  Problem Relation Age of Onset  . Diabetes Father   . Heart disease Father   . Heart disease Maternal Grandmother   . Stroke Maternal Grandfather    Social History  Substance Use Topics  . Smoking status: Current Every Day Smoker -- 0.50 packs/day for 32 years    Types: Cigarettes  . Smokeless tobacco: Never Used  . Alcohol Use: No    Review of Systems  Respiratory: Positive for cough.   Musculoskeletal: Negative for joint swelling.       Upper arm pain  Neurological: Negative for numbness.  Psychiatric/Behavioral:       Bipolar illness  All other systems reviewed and are negative.     Allergies  Desipramine  Home Medications   Prior to Admission medications   Medication Sig Start Date End Date Taking? Authorizing Provider  aspirin 325 MG  tablet Take 650 mg by mouth daily as needed for mild pain or moderate pain.    Yes Historical Provider, MD  citalopram (CELEXA) 20 MG tablet Take 20 mg by mouth daily. For mood   Yes Historical Provider, MD  haloperidol (HALDOL) 0.5 MG tablet Take 0.5 mg by mouth daily.   Yes Historical Provider, MD  lovastatin (MEVACOR) 20 MG tablet Take 1 tablet (20 mg total) by mouth at bedtime. Patient taking differently: Take 20 mg by mouth daily.  05/28/15  Yes Soyla Dryer, PA-C  traZODone (DESYREL) 100 MG tablet Take 100-200 mg by mouth at bedtime.   Yes Historical Provider, MD   BP 143/103 mmHg  Pulse 101  Temp(Src) 98.1 F (36.7 C) (Oral)  Resp 18  Ht 5\' 10"  (1.778 m)  Wt 111.131 kg  BMI 35.15 kg/m2  SpO2 100% Physical Exam  Constitutional: He is oriented to person, place, and time. He appears well-developed and well-nourished.  Non-toxic appearance.  HENT:  Head: Normocephalic.  Right Ear: Tympanic membrane and external ear normal.  Left Ear: Tympanic membrane and external ear normal.  Eyes: EOM and lids are normal. Pupils are equal, round, and reactive to light.  Neck: Normal range of motion. Neck supple. Carotid bruit is not present.  Cardiovascular: Normal rate, regular rhythm, normal heart sounds, intact distal pulses and normal pulses.   Pulmonary/Chest: Breath sounds normal. No respiratory distress.  Abdominal: Soft. Bowel sounds are normal. There is no tenderness. There is no  guarding.  Musculoskeletal: Normal range of motion.       Left upper arm: He exhibits tenderness. He exhibits no bony tenderness, no swelling, no edema, no deformity and no laceration.  Lymphadenopathy:       Head (right side): No submandibular adenopathy present.       Head (left side): No submandibular adenopathy present.    He has no cervical adenopathy.  Neurological: He is alert and oriented to person, place, and time. He has normal strength. No cranial nerve deficit or sensory deficit.  Skin: Skin is  warm and dry.  Psychiatric: He has a normal mood and affect. His speech is normal.  Nursing note and vitals reviewed.   ED Course  Procedures (including critical care time) Labs Review Labs Reviewed - No data to display  Imaging Review No results found. I have personally reviewed and evaluated these images and lab results as part of my medical decision-making.   EKG Interpretation None      MDM  Vital signs reviewed. Exam favors bicep/tricep strain/sprain. Patient provided with a sling. Prescription for baclofen and diclofenac given to the patient. The patient will follow-up with Dr. Aline Brochure for orthopedic evaluation concerning the bicep tricep area if not improving.  Patient also noted to have an elevation in blood pressure. I encouraged patient to have this rechecked as soon as possible.    Final diagnoses:  None    *I have reviewed nursing notes, vital signs, and all appropriate lab and imaging results for this patient.[id number], PA-C 06/29/15 1621  Noemi Chapel, MD 06/29/15 [id number]

## 2015-07-11 ENCOUNTER — Encounter (HOSPITAL_COMMUNITY): Payer: Self-pay

## 2015-07-11 ENCOUNTER — Emergency Department (HOSPITAL_COMMUNITY)
Admission: EM | Admit: 2015-07-11 | Discharge: 2015-07-11 | Disposition: A | Discharge status: Home / Self Care | Payer: Medicaid Other | Attending: Emergency Medicine | Admitting: Emergency Medicine

## 2015-07-11 DIAGNOSIS — Z87438 Personal history of other diseases of male genital organs: Secondary | ICD-10-CM | POA: Diagnosis not present

## 2015-07-11 DIAGNOSIS — Z79899 Other long term (current) drug therapy: Secondary | ICD-10-CM | POA: Diagnosis not present

## 2015-07-11 DIAGNOSIS — J449 Chronic obstructive pulmonary disease, unspecified: Secondary | ICD-10-CM | POA: Diagnosis not present

## 2015-07-11 DIAGNOSIS — F419 Anxiety disorder, unspecified: Secondary | ICD-10-CM | POA: Diagnosis not present

## 2015-07-11 DIAGNOSIS — G5602 Carpal tunnel syndrome, left upper limb: Secondary | ICD-10-CM

## 2015-07-11 DIAGNOSIS — Z791 Long term (current) use of non-steroidal anti-inflammatories (NSAID): Secondary | ICD-10-CM | POA: Insufficient documentation

## 2015-07-11 DIAGNOSIS — F17210 Nicotine dependence, cigarettes, uncomplicated: Secondary | ICD-10-CM | POA: Insufficient documentation

## 2015-07-11 DIAGNOSIS — Z7982 Long term (current) use of aspirin: Secondary | ICD-10-CM | POA: Insufficient documentation

## 2015-07-11 DIAGNOSIS — I10 Essential (primary) hypertension: Secondary | ICD-10-CM

## 2015-07-11 DIAGNOSIS — E785 Hyperlipidemia, unspecified: Secondary | ICD-10-CM | POA: Diagnosis not present

## 2015-07-11 DIAGNOSIS — M79602 Pain in left arm: Secondary | ICD-10-CM | POA: Diagnosis present

## 2015-07-11 DIAGNOSIS — F319 Bipolar disorder, unspecified: Secondary | ICD-10-CM | POA: Insufficient documentation

## 2015-07-11 MED ORDER — MELOXICAM 15 MG PO TABS: 15.0000 mg | ORAL_TABLET | Freq: Every day | ORAL | Status: DC

## 2015-07-11 MED ORDER — ONDANSETRON HCL 4 MG PO TABS
4.0000 mg | ORAL_TABLET | Freq: Once | ORAL | Status: AC
  Administered 2015-07-11: 4 mg via ORAL
  Filled 2015-07-11: qty 1

## 2015-07-11 MED ORDER — DEXAMETHASONE 4 MG PO TABS: 4.0000 mg | ORAL_TABLET | Freq: Two times a day (BID) | ORAL | Status: DC

## 2015-07-11 MED ORDER — KETOROLAC TROMETHAMINE 10 MG PO TABS
10.0000 mg | ORAL_TABLET | Freq: Once | ORAL | Status: AC
  Administered 2015-07-11: 10 mg via ORAL
  Filled 2015-07-11: qty 1

## 2015-07-11 MED ORDER — PREDNISONE 50 MG PO TABS
60.0000 mg | ORAL_TABLET | Freq: Once | ORAL | Status: AC
  Administered 2015-07-11: 60 mg via ORAL
  Filled 2015-07-11: qty 1

## 2015-07-11 NOTE — ED Provider Notes (Signed)
CSN: [id number]     Arrival date & time 07/11/15  0735 History   First MD Initiated Contact with Patient 07/11/15 0800     Chief Complaint  Patient presents with  . Arm Pain     (Consider location/radiation/quality/duration/timing/severity/associated sxs/prior Treatment) Patient is a 45 y.o. male presenting with arm pain. The history is provided by the patient.  Arm Pain This is a recurrent problem. The current episode started more than 1 month ago. The problem occurs intermittently. The problem has been gradually worsening. Associated symptoms include arthralgias and numbness. Associated symptoms comments: Tingling of the left forearm and hand.. Exacerbated by: Squeezing, movement of wrist, movement of elbow. He has tried nothing for the symptoms. The treatment provided no relief.    Past Medical History  Diagnosis Date  . Enlarged prostate   . Schizoaffective disorder ([place]) 11-13-14  . Bipolar disorder ([place]) age 52  . Anxiety 10-2014  . COPD (chronic obstructive pulmonary disease) ([place]) 2016  . Depression age 7  . Hyperlipidemia 2016  . Hypertension 2013  . Thyroid disease 11-2014   Past Surgical History  Procedure Laterality Date  . Hernia repair  2002   Family History  Problem Relation Age of Onset  . Diabetes Father   . Heart disease Father   . Heart disease Maternal Grandmother   . Stroke Maternal Grandfather    Social History  Substance Use Topics  . Smoking status: Current Every Day Smoker -- 0.50 packs/day for 32 years    Types: Cigarettes  . Smokeless tobacco: Never Used  . Alcohol Use: No    Review of Systems  Musculoskeletal: Positive for arthralgias.  Neurological: Positive for numbness.  Psychiatric/Behavioral: The patient is nervous/anxious.   All other systems reviewed and are negative.     Allergies  Desipramine  Home Medications   Prior to Admission medications   Medication Sig Start Date End Date Taking? Authorizing Provider  aspirin 325  MG tablet Take 650 mg by mouth daily as needed for mild pain or moderate pain.     Historical Provider, MD  baclofen (LIORESAL) 10 MG tablet Take 1 tablet (10 mg total) by mouth 3 (three) times daily. 06/29/15 07/29/15  Lily Kocher, PA-C  citalopram (CELEXA) 20 MG tablet Take 20 mg by mouth daily. For mood    Historical Provider, MD  diclofenac (VOLTAREN) 75 MG EC tablet Take 1 tablet (75 mg total) by mouth 2 (two) times daily. 06/29/15   Lily Kocher, PA-C  haloperidol (HALDOL) 0.5 MG tablet Take 0.5 mg by mouth daily.    Historical Provider, MD  lovastatin (MEVACOR) 20 MG tablet Take 1 tablet (20 mg total) by mouth at bedtime. Patient taking differently: Take 20 mg by mouth daily.  05/28/15   Soyla Dryer, PA-C  traZODone (DESYREL) 100 MG tablet Take 100-200 mg by mouth at bedtime.    Historical Provider, MD   BP 131/108 mmHg  Pulse 94  Temp(Src) 97.7 F (36.5 C) (Oral)  Resp 20  Ht 5\' 9"  (1.753 m)  Wt 108.863 kg  BMI 35.43 kg/m2  SpO2 92% Physical Exam  Constitutional: He is oriented to person, place, and time. He appears well-developed and well-nourished.  Non-toxic appearance.  HENT:  Head: Normocephalic.  Right Ear: Tympanic membrane and external ear normal.  Left Ear: Tympanic membrane and external ear normal.  Eyes: EOM and lids are normal. Pupils are equal, round, and reactive to light.  Neck: Normal range of motion. Neck supple. Carotid bruit is not  present.  No carotid bruits appreciated.  Cardiovascular: Normal rate, regular rhythm, normal heart sounds, intact distal pulses and normal pulses.   Pulmonary/Chest: Breath sounds normal. No respiratory distress.  Abdominal: Soft. Bowel sounds are normal. There is no tenderness. There is no guarding.  Musculoskeletal: Normal range of motion.  There is full range of motion of the left shoulder. There is good range of motion of the left elbow. There is pain to palpation of the triceps area, particularly at the insertion of the  tricep. There is no effusion of the left elbow. The elbow is not hot. There is no pain of the forearm to palpation, however there is tingling with range of motion of the forearm area and wrist. There is a positive Tinel's sign. Capillary refill is less than 2 seconds. Radial pulses are 2+.  Lymphadenopathy:       Head (right side): No submandibular adenopathy present.       Head (left side): No submandibular adenopathy present.    He has no cervical adenopathy.  Neurological: He is alert and oriented to person, place, and time. He has normal strength. No cranial nerve deficit or sensory deficit.  The grip is symmetrical, but there is a hesitancy to grip on the left. There no gross sensory deficits appreciated. There is no atrophy of the thenar areas or forearm.  Skin: Skin is warm and dry.  Psychiatric: He has a normal mood and affect. His speech is normal.  Nursing note and vitals reviewed.   ED Course  Procedures (including critical care time) Labs Review Labs Reviewed - No data to display  Imaging Review No results found. I have personally reviewed and evaluated these images and lab results as part of my medical decision-making.   EKG Interpretation None      MDM  Vital signs reviewed. The blood pressure is elevated at 131/108. The examination favors carpal tunnel on the left, as well as tricep strain on the left. Discussed findings with the patient in terms which he understands. Patient advised to see Dr. Aline Brochure for orthopedic evaluation and management. The patient is placed on Decadron and mobile. Patient is also provided with a cockup wrist splint, and a sling.    Final diagnoses:  None    **I have reviewed nursing notes, vital signs, and all appropriate lab and imaging results for this patient.Lily Kocher, PA-C 07/12/15 [place], MD 07/15/15 [phone]

## 2015-07-11 NOTE — Discharge Instructions (Signed)
It is important that you see Dr. Aline Brochure concerning your carpal tunnel as soon as possible. It is important that she see Ms. McElroy concerning your blood pressure. Please use Decadron and mobile for tingling and discomfort. Carpal Tunnel Syndrome Carpal tunnel syndrome is a condition that causes pain in your hand and arm. The carpal tunnel is a narrow area that is on the palm side of your wrist. Repeated wrist motion or certain diseases may cause swelling in the tunnel. This swelling can pinch the main nerve in the wrist (median nerve).  HOME CARE If You Have a Splint:  Wear it as told by your doctor. Remove it only as told by your doctor.  Loosen the splint if your fingers:  Become numb and tingle.  Turn blue and cold.  Keep the splint clean and dry. General Instructions  Take over-the-counter and prescription medicines only as told by your doctor.  Rest your wrist from any activity that may be causing your pain. If needed, talk to your employer about changes that can be made in your work, such as getting a wrist pad to use while typing.  If directed, apply ice to the painful area:  Put ice in a plastic bag.  Place a towel between your skin and the bag.  Leave the ice on for 20 minutes, 2-3 times per day.  Keep all follow-up visits as told by your doctor. This is important.  Do any exercises as told by your doctor, physical therapist, or occupational therapist. GET HELP IF:  You have new symptoms.  Medicine does not help your pain.  Your symptoms get worse.   This information is not intended to replace advice given to you by your health care provider. Make sure you discuss any questions you have with your health care provider.   Document Released: 06/11/2011 Document Revised: 03/13/2015 Document Reviewed: 11/07/2014 Elsevier Interactive Patient Education [employer].

## 2015-07-11 NOTE — ED Notes (Signed)
Pt reports pain in left upper arm and elbow x 2 or 3 weeks.  Also reports numbness and tingling in forearm and hand.  Pt says pain is constant but at times it is worse than others.  Denies injury.  Reports was seen here a couple of weeks ago for same.

## 2015-07-25 ENCOUNTER — Encounter: Payer: Self-pay | Admitting: Orthopedic Surgery

## 2015-07-25 ENCOUNTER — Ambulatory Visit (HOSPITAL_COMMUNITY): Payer: Medicaid Other | Attending: Orthopedic Surgery | Admitting: Occupational Therapy

## 2015-07-25 ENCOUNTER — Ambulatory Visit (INDEPENDENT_AMBULATORY_CARE_PROVIDER_SITE_OTHER): Payer: Self-pay | Admitting: Orthopedic Surgery

## 2015-07-25 ENCOUNTER — Encounter (HOSPITAL_COMMUNITY): Payer: Self-pay | Admitting: Occupational Therapy

## 2015-07-25 DIAGNOSIS — G5622 Lesion of ulnar nerve, left upper limb: Secondary | ICD-10-CM

## 2015-07-25 DIAGNOSIS — G5602 Carpal tunnel syndrome, left upper limb: Secondary | ICD-10-CM

## 2015-07-25 MED ORDER — GABAPENTIN 100 MG PO CAPS: 100.0000 mg | ORAL_CAPSULE | Freq: Three times a day (TID) | ORAL | Status: DC

## 2015-07-25 NOTE — Therapy (Signed)
[facility] [place], [place], [address] Phone: [phone]   Fax:  [phone]  Occupational Therapy Evaluation  Patient Details  Name: Steve Romero MRN: [id number] Date of Birth: Jul 24, 1970 No Data Recorded  Encounter Date: 07/25/2015      OT End of Session - 07/25/15 1231    Visit Number 1   Number of Visits 1   Date for OT Re-Evaluation 09/23/15   Authorization Type [facility]   OT Start Time 1148   OT Stop Time 1215   OT Time Calculation (min) 27 min   Activity Tolerance Patient tolerated treatment well   Behavior During Therapy [facility] for tasks assessed/performed      Past Medical History  Diagnosis Date  . Enlarged prostate   . Schizoaffective disorder ([place]) 11-13-14  . Bipolar disorder ([place]) age 63  . Anxiety 10-2014  . COPD (chronic obstructive pulmonary disease) ([place]) 2016  . Depression age 25  . Hyperlipidemia 2016  . Hypertension 2013  . Thyroid disease 11-2014    Past Surgical History  Procedure Laterality Date  . Hernia repair  2002    There were no vitals filed for this visit.  Visit Diagnosis:  Cubital tunnel syndrome, left      Subjective Assessment - 07/25/15 1229    Subjective  S: My arm has been hurting for about 2 weeks.    Pertinent History Pt is a 45 y/o male presenting for night flexion splint for left cubital tunnel syndrome. Pt reports he has been experiencing pain for approximately 2 weeks, presents to therapy wearing a prefabricated left wrist splint. Dr. Aline Brochure referred pt to occupational therapy for splint fabrication.    Patient Stated Goals For my arm to stop hurting   Currently in Pain? No/denies                     OT Treatments/Exercises (OP) - 07/25/15 1301    Splinting   Splinting Left elbow flexion splint fabricated for night use with cubital tunnel syndrome, elbow in 15 degrees flexion per MD order. Pt educated on splint wear and care.                 OT Education - 07/25/15 1230    Education provided Yes   Education Details Splint wear and care instructions   Person(s) Educated Patient   Methods Explanation;Handout   Comprehension Verbalized understanding;Returned demonstration          OT Short Term Goals - 07/25/15 1233    OT SHORT TERM GOAL #1   Title pt will be educated on splint wear and care.    Time 1   Period Days   Status Achieved                  Plan - 07/25/15 1231    Clinical Impression Statement A: Pt is a 45 y/o male presenting for splint fabrication for left cubital tunnel syndrome. Left elbow flexion splint was fabricated with elbow in 15 degrees flexion per MD order, to be worn at night only. Pt was educated on splint wear and care, instructed to call if experiences problems or if splint is uncomfortable.    Pt will benefit from skilled therapeutic intervention in order to improve on the following deficits (Retired) Pain   Rehab Potential Good   OT Frequency 1x / week   OT Duration --  1 visit   OT Treatment/Interventions Splinting   Plan  P: Pt fitted with left night flexion splint for cubital tunnel syndrome.    OT Home Exercise Plan splint wear and care   Consulted and Agree with Plan of Care Patient        Problem List Patient Active Problem List   Diagnosis Date Noted  . HTN (hypertension) 04/22/2015  . Hyperlipemia 04/22/2015  . Schizoaffective disorder ([place]) 04/22/2015  . Anxiety state 04/22/2015  . Cigarette nicotine dependence without complication [id number]  . Edema 04/22/2015  . Lymphadenopathy 04/22/2015    Guadelupe Sabin, OTR/L  [phone]  07/25/2015, 4:12 PM  [facility] [address] [place], [place], [address] Phone: [phone]   Fax:  [phone]  Name: Steve Romero MRN: [id number] Date of Birth: 03-27-1971

## 2015-07-25 NOTE — Patient Instructions (Addendum)
Call aph therapy dept for splint We will refer to neurology for nerve conduction study 1 new medication sent to your pharmacy     Carpal Tunnel Syndrome Carpal tunnel syndrome is a condition that causes pain in your hand and arm. The carpal tunnel is a narrow area located on the palm side of your wrist. Repeated wrist motion or certain diseases may cause swelling within the tunnel. This swelling pinches the main nerve in the wrist (median nerve). CAUSES  This condition may be caused by:   Repeated wrist motions.  Wrist injuries.  Arthritis.  A cyst or tumor in the carpal tunnel.  Fluid buildup during pregnancy. Sometimes the cause of this condition is not known.  RISK FACTORS This condition is more likely to develop in:   People who have jobs that cause them to repeatedly move their wrists in the same motion, such as butchers and cashiers.  Women.  People with certain conditions, such as:  Diabetes.  Obesity.  An underactive thyroid (hypothyroidism).  Kidney failure. SYMPTOMS  Symptoms of this condition include:   A tingling feeling in your fingers, especially in your thumb, index, and middle fingers.  Tingling or numbness in your hand.  An aching feeling in your entire arm, especially when your wrist and elbow are bent for long periods of time.  Wrist pain that goes up your arm to your shoulder.  Pain that goes down into your palm or fingers.  A weak feeling in your hands. You may have trouble grabbing and holding items. Your symptoms may feel worse during the night.  DIAGNOSIS  This condition is diagnosed with a medical history and physical exam. You may also have tests, including:   An electromyogram (EMG). This test measures electrical signals sent by your nerves into the muscles.  X-rays.   TREATMENT  Treatment for this condition includes:  Lifestyle changes. It is important to stop doing or modify the activity that caused your  condition.  Physical or occupational therapy.  Medicines for pain and inflammation. This may include medicine that is injected into your wrist.  A wrist splint.   HOME CARE INSTRUCTIONS  If You Have a Splint:  Wear it as told by your health care provider. Remove it only as told by your health care provider.  Loosen the splint if your fingers become numb and tingle, or if they turn cold and blue.  Keep the splint clean and dry. General Instructions  Take over-the-counter and prescription medicines only as told by your health care provider.  Rest your wrist from any activity that may be causing your pain. If your condition is work related, talk to your employer about changes that can be made, such as getting a wrist pad to use while typing.  If directed, apply ice to the painful area:  Put ice in a plastic bag.  Place a towel between your skin and the bag.  Leave the ice on for 20 minutes, 2-3 times per day.  Keep all follow-up visits as told by your health care provider. This is important.  Do any exercises as told by your health care provider, physical therapist, or occupational therapist.   This information is not intended to replace advice given to you by your health care provider. Make sure you discuss any questions you have with your health care provider.   Document Released: 06/19/2000 Document Revised: 03/13/2015 Document Reviewed: 11/07/2014 Elsevier Interactive Patient Education [employer].

## 2015-07-25 NOTE — Progress Notes (Signed)
Patient ID: Steve Romero, male   DOB: 1970/10/27, 45 y.o.   MRN: [id number]  Chief Complaint  Patient presents with  . Arm Pain    ER follow up Left arm pain and tingling from elbow to fingers, no known injury     HPI Steve Romero is a 45 y.o. male.  45 year old male presents from the emergency room with pain and paresthesias in his left upper extremity with no history of trauma. Symptoms started in his left elbow radiating down into his hand but now involve the entire hand with numbness and loss of sensation. Quality is dull aching burning sensation severe at times waxes and waning over several months time. Context again no trauma modifying factors he's been on low back and prednisone and he's worn the left wrist splint for the last 2 weeks   Review of Systems Review of Systems Constitutional symptoms none. Musculoskeletal findings mild weakness left upper extremity nerve symptoms as stated skin symptoms none  Past Medical History  Diagnosis Date  . Enlarged prostate   . Schizoaffective disorder ([place]) 11-13-14  . Bipolar disorder ([place]) age 11  . Anxiety 10-2014  . COPD (chronic obstructive pulmonary disease) ([place]) 2016  . Depression age 28  . Hyperlipidemia 2016  . Hypertension 2013  . Thyroid disease 11-2014    Past Surgical History  Procedure Laterality Date  . Hernia repair  2002    Family History  Problem Relation Age of Onset  . Diabetes Father   . Heart disease Father   . Heart disease Maternal Grandmother   . Stroke Maternal Grandfather     Social History Social History  Substance Use Topics  . Smoking status: Current Every Day Smoker -- 0.50 packs/day for 32 years    Types: Cigarettes  . Smokeless tobacco: Never Used  . Alcohol Use: No    Allergies  Allergen Reactions  . Desipramine Nausea Only and Rash    Current Outpatient Prescriptions  Medication Sig Dispense Refill  . aspirin 325 MG tablet Take 650 mg by mouth daily as needed for mild pain  or moderate pain.     . citalopram (CELEXA) 20 MG tablet Take 20 mg by mouth daily. For mood    . haloperidol (HALDOL) 0.5 MG tablet Take 0.5 mg by mouth daily.    Marland Kitchen lovastatin (MEVACOR) 20 MG tablet Take 1 tablet (20 mg total) by mouth at bedtime. (Patient taking differently: Take 20 mg by mouth daily. ) 30 tablet 4  . traZODone (DESYREL) 100 MG tablet Take 100-200 mg by mouth at bedtime.    . gabapentin (NEURONTIN) 100 MG capsule Take 1 capsule (100 mg total) by mouth 3 (three) times daily. 90 capsule 0   No current facility-administered medications for this visit.     Physical Exam Blood pressure 154/110, height 5\' 9"  (1.753 m), weight 240 lb (108.863 kg). Physical Exam The patient is well developed well nourished and well groomed.  Orientation to person place and time is normal  Mood is pleasant.  Ambulatory status normal noncontributory  Left Upper extremity examination reveals the following:  Inspection reveals no swelling. There is tenderness over the carpal tunnel.  Range of motion of the wrist and elbow are normal  Motor exam shows mild weakness with grip strength.  Wrist joint is stable  Provocative tests for carpal tunnel Phalen's test  positive Carpal tunnel compression test positive  We also no tenderness over the medial epicondyle and ulnar nerve with positive Tinel's   Pulses  are normal in the radial and ulnar artery with a normal Allen's test.  Decreased sensation is noted in the median nerve distribution. Soft touch is normal.  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

## 2015-07-26 ENCOUNTER — Encounter: Payer: Self-pay | Admitting: Physician Assistant

## 2015-07-29 ENCOUNTER — Telehealth: Payer: Self-pay | Admitting: *Deleted

## 2015-07-29 NOTE — Telephone Encounter (Signed)
REFERRAL FAXED TO [facility]

## 2015-07-30 ENCOUNTER — Other Ambulatory Visit: Payer: Self-pay | Admitting: *Deleted

## 2015-07-30 DIAGNOSIS — M25522 Pain in left elbow: Secondary | ICD-10-CM

## 2015-07-30 NOTE — Telephone Encounter (Signed)
Patient states he called today, 07/30/15 [facility] at [phone] (I relayed we had faxed it yesterday, 07/29/15); states was told referral not received there.

## 2015-07-31 NOTE — Telephone Encounter (Signed)
Order re faxed this morning

## 2015-08-06 ENCOUNTER — Ambulatory Visit (INDEPENDENT_AMBULATORY_CARE_PROVIDER_SITE_OTHER): Payer: Self-pay | Admitting: Neurology

## 2015-08-06 DIAGNOSIS — M79622 Pain in left upper arm: Secondary | ICD-10-CM

## 2015-08-06 DIAGNOSIS — M5412 Radiculopathy, cervical region: Secondary | ICD-10-CM

## 2015-08-06 DIAGNOSIS — M25522 Pain in left elbow: Secondary | ICD-10-CM

## 2015-08-06 DIAGNOSIS — G5602 Carpal tunnel syndrome, left upper limb: Secondary | ICD-10-CM

## 2015-08-06 NOTE — Procedures (Signed)
[facility]  [place], [place]  [place], [place] [address] Tel: [phone] Fax:  [phone] Test Date:  08/06/2015  Patient: Steve Romero DOB: Nov 07, 1970 Physician: Narda Amber  Sex: Male Height: 5\' 9"  Ref Phys: [name], M.D.  ID#: [id number] Temp: 32.2C Technician: Jerilynn Mages. Dean   Patient Complaints: This is a 45 years old gentleman referred for evaluation of left elbow pain and hand numbness.  NCV & EMG Findings: Extensive electrodiagnostic testing of the left upper extremity shows: 1. Left median, ulnar, and dorsal ulnar cutaneous sensory responses are within normal limits. Left median palmar studies are abnormal. 2. Left median and ulnar motor responses are within normal limits. 3. Chronic motor axon loss changes are seen affecting the C8 myotomes, without accompanied active denervation.  Impression: 1. Left median neuropathy at or distal to the wrist, consistent with the clinical diagnosis of carpal tunnel syndrome. Overall, these findings are very mild in degree electrically. 2. Chronic C8 radiculopathy affecting the left upper extremity, mild in degree electrically. 3. There is no evidence of an ulnar neuropathy affecting the left upper extremity.   _____________________________ Narda Amber, D.O.    Nerve Conduction Studies Anti Sensory Summary Table   Stim Site NR Peak (ms) Norm Peak (ms) P-T Amp (V) Norm P-T Amp  Left DorsCutan Anti Sensory ([place] 5th MC)  32.2C  Wrist    1.7 <3.1 15.5 >12  Left Median Anti Sensory (2nd Digit)  32.2C  Wrist    3.3 <3.4 25.9 >20  Left Ulnar Anti Sensory (5th Digit)  32.2C  Wrist    2.8 <3.1 27.2 >12   Motor Summary Table   Stim Site NR Onset (ms) Norm Onset (ms) O-P Amp (mV) Norm O-P Amp Site1 Site2 Delta-0 (ms) Dist (cm) Vel (m/s) Norm Vel (m/s)  Left Median Motor (Abd Poll Brev)  32.2C  Wrist    3.6 <3.9 6.7 >6 Elbow Wrist 4.4 25.0 57 >50  Elbow    8.0  6.3         Left Ulnar Motor (Abd Dig  Minimi)  32.2C  Wrist    3.0 <3.1 11.4 >7 B Elbow Wrist 4.0 24.0 60 >50  B Elbow    7.0  10.7  A Elbow B Elbow 1.6 10.0 63 >50  A Elbow    8.6  10.5          Comparison Summary Table   Stim Site NR Peak (ms) Norm Peak (ms) P-T Amp (V) Site1 Site2 Delta-P (ms) Norm Delta (ms)  Left Median/Ulnar Palm Comparison (Wrist - 8cm)  32.2C  Median Palm    2.2 <2.2 23.8 Median Palm Ulnar Palm 0.4   Ulnar Palm    1.8 <2.2 7.0       EMG   Side Muscle Ins Act Fibs Psw Fasc Number Recrt Dur Dur. Amp Amp. Poly Poly. Comment  Left 1stDorInt Nml Nml Nml Nml 1- Rapid Few 1+ Few 1+ Nml Nml N/A  Left Abd Poll Brev Nml Nml Nml Nml Nml Nml Nml Nml Nml Nml Nml Nml N/A  Left Ext Indicis Nml Nml Nml Nml 1- Mod-R Few 1+ Nml Nml Nml Nml N/A  Left FlexPolLong Nml Nml Nml Nml Nml Nml Nml Nml Nml Nml Nml Nml N/A  Left PronatorTeres Nml Nml Nml Nml Nml Nml Nml Nml Nml Nml Nml Nml N/A  Left Biceps Nml Nml Nml Nml Nml Nml Nml Nml Nml Nml Nml Nml N/A  Left Triceps Nml Nml Nml Nml 1-  Rapid Some 1+ Some 1+ Few 1+ N/A  Left Deltoid Nml Nml Nml Nml Nml Nml Nml Nml Nml Nml Nml Nml N/A      Waveforms:

## 2015-08-27 ENCOUNTER — Encounter: Payer: Self-pay | Admitting: Physician Assistant

## 2015-08-27 ENCOUNTER — Ambulatory Visit: Payer: Self-pay | Admitting: Physician Assistant

## 2015-08-27 VITALS — BP 134/90 | HR 83 | Temp 98.1°F | Ht 69.0 in | Wt 245.6 lb

## 2015-08-27 DIAGNOSIS — E785 Hyperlipidemia, unspecified: Secondary | ICD-10-CM

## 2015-08-27 DIAGNOSIS — E669 Obesity, unspecified: Secondary | ICD-10-CM

## 2015-08-27 DIAGNOSIS — F17210 Nicotine dependence, cigarettes, uncomplicated: Secondary | ICD-10-CM

## 2015-08-27 MED ORDER — ATORVASTATIN CALCIUM 20 MG PO TABS: 20.0000 mg | ORAL_TABLET | Freq: Every day | ORAL | Status: DC

## 2015-08-27 NOTE — Progress Notes (Signed)
BP 134/90 mmHg  Pulse 83  Temp(Src) 98.1 F (36.7 C)  Ht 5\' 9"  (1.753 m)  Wt 245 lb 9.6 oz (111.403 kg)  BMI 36.25 kg/m2  SpO2 97%   Subjective:    Patient ID: Steve Romero, male    DOB: [birth year], 45 y.o.   MRN: [id number]  HPI: Steve Romero is a 45 y.o. male presenting on 08/27/2015 for Hyperlipidemia   HPI   Pt is past due for counseling appt at [facility]  Pt not taking his lovastatin. Discussed priorities- he continues to buy his cigarettes but says he cannot afford the $4/month for his lovastatin  Pt says he was approved for [employer] discount through may.    However, pt says he is no longer having problems with LE edema.  Pt did have echo done 05/2105 which showed moderate LVH with nl ejection fraction.    Relevant past medical, surgical, family and social history reviewed and updated as indicated. Interim medical history since our last visit reviewed. Allergies and medications reviewed and updated.  Current outpatient prescriptions:  .  aspirin 325 MG tablet, Take 650 mg by mouth daily as needed for mild pain or moderate pain. , Disp: , Rfl:  .  citalopram (CELEXA) 20 MG tablet, Take 20 mg by mouth daily. For mood, Disp: , Rfl:  .  haloperidol (HALDOL) 0.5 MG tablet, Take 0.5 mg by mouth daily., Disp: , Rfl:  .  traZODone (DESYREL) 100 MG tablet, Take 100-200 mg by mouth at bedtime., Disp: , Rfl:  .  gabapentin (NEURONTIN) 100 MG capsule, Take 1 capsule (100 mg total) by mouth 3 (three) times daily. (Patient not taking: Reported on 08/27/2015), Disp: 90 capsule, Rfl: 0 .  lovastatin (MEVACOR) 20 MG tablet, Take 1 tablet (20 mg total) by mouth at bedtime. (Patient not taking: Reported on 08/27/2015), Disp: 30 tablet, Rfl: 4  Review of Systems  Constitutional: Positive for fatigue. Negative for fever, chills, diaphoresis, appetite change and unexpected weight change.  HENT: Positive for dental problem and hearing loss. Negative for congestion, drooling, ear pain, facial  swelling, mouth sores, sneezing, sore throat, trouble swallowing and voice change.   Eyes: Negative for pain, discharge, redness, itching and visual disturbance.  Respiratory: Positive for shortness of breath and wheezing. Negative for cough and choking.   Cardiovascular: Negative for chest pain, palpitations and leg swelling.  Gastrointestinal: Negative for vomiting, abdominal pain, diarrhea, constipation and blood in stool.  Endocrine: Negative for cold intolerance, heat intolerance and polydipsia.  Genitourinary: Negative for dysuria, hematuria and decreased urine volume.  Musculoskeletal: Positive for back pain, arthralgias and gait problem.  Skin: Negative for rash.  Allergic/Immunologic: Negative for environmental allergies.  Neurological: Negative for seizures, syncope, light-headedness and headaches.  Hematological: Negative for adenopathy.  Psychiatric/Behavioral: Positive for dysphoric mood and agitation. Negative for suicidal ideas. The patient is nervous/anxious.     Per HPI unless specifically indicated above     Objective:    BP 134/90 mmHg  Pulse 83  Temp(Src) 98.1 F (36.7 C)  Ht 5\' 9"  (1.753 m)  Wt 245 lb 9.6 oz (111.403 kg)  BMI 36.25 kg/m2  SpO2 97%  Wt Readings from Last 3 Encounters:  08/27/15 245 lb 9.6 oz (111.403 kg)  07/25/15 240 lb (108.863 kg)  07/11/15 240 lb (108.863 kg)    Physical Exam  Constitutional: He is oriented to person, place, and time. He appears well-developed and well-nourished.  HENT:  Head: Normocephalic and atraumatic.  Neck: Neck supple.  Cardiovascular: Normal rate and regular rhythm.   Pulmonary/Chest: Effort normal and breath sounds normal. He has no wheezes.  Abdominal: Soft. Bowel sounds are normal. There is no hepatosplenomegaly. There is no tenderness.  Musculoskeletal: He exhibits no edema.  Lymphadenopathy:    He has no cervical adenopathy.  Neurological: He is alert and oriented to person, place, and time.  Skin:  Skin is warm and dry.  Psychiatric: He has a normal mood and affect. His behavior is normal.  Vitals reviewed.       Assessment & Plan:    Encounter Diagnoses  Name Primary?  . Hyperlipidemia Yes  . Cigarette nicotine dependence without complication   . Obesity, unspecified    -sign pt up for [facility]- order lipitor -pt counseled on Smoking cessation -F/u 3 months- RTO sooner prn

## 2015-08-27 NOTE — Patient Instructions (Signed)
Smoking Cessation, Tips for Success If you are ready to quit smoking, congratulations! You have chosen to help yourself be healthier. Cigarettes bring nicotine, tar, carbon monoxide, and other irritants into your body. Your lungs, heart, and blood vessels will be able to work better without these poisons. There are many different ways to quit smoking. Nicotine gum, nicotine patches, a nicotine inhaler, or nicotine nasal spray can help with physical craving. Hypnosis, support groups, and medicines help break the habit of smoking. WHAT THINGS CAN I DO TO MAKE QUITTING EASIER?  Here are some tips to help you quit for good:  Pick a date when you will quit smoking completely. Tell all of your friends and family about your plan to quit on that date.  Do not try to slowly cut down on the number of cigarettes you are smoking. Pick a quit date and quit smoking completely starting on that day.  Throw away all cigarettes.   Clean and remove all ashtrays from your home, work, and car.  On a card, write down your reasons for quitting. Carry the card with you and read it when you get the urge to smoke.  Cleanse your body of nicotine. Drink enough water and fluids to keep your urine clear or pale yellow. Do this after quitting to flush the nicotine from your body.  Learn to predict your moods. Do not let a bad situation be your excuse to have a cigarette. Some situations in your life might tempt you into wanting a cigarette.  Never have "just one" cigarette. It leads to wanting another and another. Remind yourself of your decision to quit.  Change habits associated with smoking. If you smoked while driving or when feeling stressed, try other activities to replace smoking. Stand up when drinking your coffee. Brush your teeth after eating. Sit in a different chair when you read the paper. Avoid alcohol while trying to quit, and try to drink fewer caffeinated beverages. Alcohol and caffeine may urge you to  smoke.  Avoid foods and drinks that can trigger a desire to smoke, such as sugary or spicy foods and alcohol.  Ask people who smoke not to smoke around you.  Have something planned to do right after eating or having a cup of coffee. For example, plan to take a walk or exercise.  Try a relaxation exercise to calm you down and decrease your stress. Remember, you may be tense and nervous for the first 2 weeks after you quit, but this will pass.  Find new activities to keep your hands busy. Play with a pen, coin, or rubber band. Doodle or draw things on paper.  Brush your teeth right after eating. This will help cut down on the craving for the taste of tobacco after meals. You can also try mouthwash.   Use oral substitutes in place of cigarettes. Try using lemon drops, carrots, cinnamon sticks, or chewing gum. Keep them handy so they are available when you have the urge to smoke.  When you have the urge to smoke, try deep breathing.  Designate your home as a nonsmoking area.  If you are a heavy smoker, ask your health care provider about a prescription for nicotine chewing gum. It can ease your withdrawal from nicotine.  Reward yourself. Set aside the cigarette money you save and buy yourself something nice.  Look for support from others. Join a support group or smoking cessation program. Ask someone at home or at work to help you with your plan   to quit smoking.  Always ask yourself, "Do I need this cigarette or is this just a reflex?" Tell yourself, "Today, I choose not to smoke," or "I do not want to smoke." You are reminding yourself of your decision to quit.  Do not replace cigarette smoking with electronic cigarettes (commonly called e-cigarettes). The safety of e-cigarettes is unknown, and some may contain harmful chemicals.  If you relapse, do not give up! Plan ahead and think about what you will do the next time you get the urge to smoke. HOW WILL I FEEL WHEN I QUIT SMOKING? You  may have symptoms of withdrawal because your body is used to nicotine (the addictive substance in cigarettes). You may crave cigarettes, be irritable, feel very hungry, cough often, get headaches, or have difficulty concentrating. The withdrawal symptoms are only temporary. They are strongest when you first quit but will go away within 10-14 days. When withdrawal symptoms occur, stay in control. Think about your reasons for quitting. Remind yourself that these are signs that your body is healing and getting used to being without cigarettes. Remember that withdrawal symptoms are easier to treat than the major diseases that smoking can cause.  Even after the withdrawal is over, expect periodic urges to smoke. However, these cravings are generally short lived and will go away whether you smoke or not. Do not smoke! WHAT RESOURCES ARE AVAILABLE TO HELP ME QUIT SMOKING? Your health care provider can direct you to community resources or hospitals for support, which may include:  Group support.  Education.  Hypnosis.  Therapy.   This information is not intended to replace advice given to you by your health care provider. Make sure you discuss any questions you have with your health care provider.   Document Released: 03/20/2004 Document Revised: 07/13/2014 Document Reviewed: 12/08/2012 Elsevier Interactive Patient Education 2016 Elsevier Inc.  

## 2015-09-01 DIAGNOSIS — E669 Obesity, unspecified: Secondary | ICD-10-CM | POA: Insufficient documentation

## 2015-09-09 ENCOUNTER — Encounter: Payer: Self-pay | Admitting: Orthopedic Surgery

## 2015-09-09 ENCOUNTER — Ambulatory Visit (INDEPENDENT_AMBULATORY_CARE_PROVIDER_SITE_OTHER): Payer: Self-pay | Admitting: Orthopedic Surgery

## 2015-09-09 VITALS — BP 139/90 | Ht 69.0 in | Wt 245.0 lb

## 2015-09-09 DIAGNOSIS — G5602 Carpal tunnel syndrome, left upper limb: Secondary | ICD-10-CM

## 2015-09-09 DIAGNOSIS — M5412 Radiculopathy, cervical region: Secondary | ICD-10-CM

## 2015-09-09 MED ORDER — GABAPENTIN 100 MG PO CAPS: 100.0000 mg | ORAL_CAPSULE | Freq: Three times a day (TID) | ORAL | Status: DC

## 2015-09-09 MED ORDER — VITAMIN B-6 100 MG PO TABS: 100.0000 mg | ORAL_TABLET | Freq: Two times a day (BID) | ORAL | Status: DC

## 2015-09-09 NOTE — Patient Instructions (Signed)
CONTINUE BRACE   TWO MEDICATIONS SENT TO YOUR PHARMACY

## 2015-09-09 NOTE — Progress Notes (Signed)
follow up  Diagnosis is carpal tunnel syndrome of the left wrist and hand  The patient went for nerve conduction study at the [facility] neurology on January 31 his study came back chronic C8 radiculopathy and left upper extremity mild and mild left carpal tunnel syndrome  He was put on Neurontin and vitamin B 6 and placed in a wrist splint. However, financially he cannot get the medication.  He does report that his elbow pain is resolved he still has numbness and tingling in the hand  Review of Systems  Constitutional: Negative for fever.  Musculoskeletal: Positive for myalgias. Negative for neck pain.  Neurological: Positive for tingling. Negative for focal weakness.   BP 139/90 mmHg  Ht 5\' 9"  (1.753 m)  Wt 245 lb (111.131 kg)  BMI 36.16 kg/m2  Physical Exam  Constitutional: He is oriented to person, place, and time and well-developed, well-nourished, and in no distress. No distress.  Neurological: He is alert and oriented to person, place, and time. He displays normal stance and normal reflexes. He exhibits normal muscle tone. Coordination and gait normal.  Sensory changes in the median nerve. Negative Tinel's at the elbow.  Full range of motion of the hand wrist elbow and shoulder  No instability noted in the hand wrist or elbow.  Skin: Skin is warm and dry. No rash noted. No erythema. No pallor.  Psychiatric: Mood, memory, affect and judgment normal.    Nerve report reviewed and recorded  Carpal tunnel syndrome left mild C8 radiculopathy from cervical spine  Reorder Neurontin 100 mg 3 times a day and vitamin B 600 mg twice a day continue splint follow-up 3 months

## 2015-09-18 NOTE — Telephone Encounter (Signed)
PATIENT WAS SEEN 08/06/15

## 2015-10-29 ENCOUNTER — Encounter: Payer: Self-pay | Admitting: Physician Assistant

## 2015-11-06 ENCOUNTER — Encounter: Payer: Self-pay | Admitting: Family Medicine

## 2015-11-25 ENCOUNTER — Ambulatory Visit: Payer: Self-pay | Admitting: Physician Assistant

## 2015-12-09 ENCOUNTER — Ambulatory Visit: Payer: Self-pay | Admitting: Orthopedic Surgery

## 2015-12-09 ENCOUNTER — Encounter: Payer: Self-pay | Admitting: Orthopedic Surgery

## 2015-12-30 ENCOUNTER — Emergency Department (HOSPITAL_COMMUNITY)
Admission: EM | Admit: 2015-12-30 | Discharge: 2015-12-30 | Disposition: A | Discharge status: Home / Self Care | Payer: Medicaid Other | Attending: Emergency Medicine | Admitting: Emergency Medicine

## 2015-12-30 ENCOUNTER — Encounter (HOSPITAL_COMMUNITY): Payer: Self-pay | Admitting: Emergency Medicine

## 2015-12-30 DIAGNOSIS — F17210 Nicotine dependence, cigarettes, uncomplicated: Secondary | ICD-10-CM | POA: Diagnosis not present

## 2015-12-30 DIAGNOSIS — M542 Cervicalgia: Secondary | ICD-10-CM | POA: Diagnosis present

## 2015-12-30 DIAGNOSIS — M5412 Radiculopathy, cervical region: Secondary | ICD-10-CM | POA: Insufficient documentation

## 2015-12-30 DIAGNOSIS — F329 Major depressive disorder, single episode, unspecified: Secondary | ICD-10-CM | POA: Diagnosis not present

## 2015-12-30 DIAGNOSIS — E785 Hyperlipidemia, unspecified: Secondary | ICD-10-CM | POA: Diagnosis not present

## 2015-12-30 DIAGNOSIS — J449 Chronic obstructive pulmonary disease, unspecified: Secondary | ICD-10-CM | POA: Insufficient documentation

## 2015-12-30 DIAGNOSIS — Z7982 Long term (current) use of aspirin: Secondary | ICD-10-CM | POA: Insufficient documentation

## 2015-12-30 DIAGNOSIS — I10 Essential (primary) hypertension: Secondary | ICD-10-CM | POA: Diagnosis not present

## 2015-12-30 MED ORDER — PREDNISONE 10 MG PO TABS: ORAL_TABLET | ORAL | Status: DC

## 2015-12-30 MED ORDER — GABAPENTIN 100 MG PO CAPS: 100.0000 mg | ORAL_CAPSULE | Freq: Three times a day (TID) | ORAL | Status: DC

## 2015-12-30 MED ORDER — TRAMADOL HCL 50 MG PO TABS: 50.0000 mg | ORAL_TABLET | Freq: Four times a day (QID) | ORAL | Status: DC | PRN

## 2015-12-30 NOTE — ED Notes (Signed)
Patient complaining of neck pain radiating into left arm off and on x 1 month, worsening 2 days ago. States he is seeing Dr Aline Brochure for same.

## 2015-12-30 NOTE — ED Provider Notes (Signed)
CSN: [id number]     Arrival date & time 12/30/15  1602 History  By signing my name below, I, [facility], attest that this documentation has been prepared under the direction and in the presence of Eretria Manternach, PA-C. Electronically Signed: Virgel Bouquet, ED Scribe. 12/30/2015. 4:23 PM.   Chief Complaint  Patient presents with  . Neck Pain    The history is provided by the patient. No language interpreter was used.   HPI Comments: Steve Romero is a 45 y.o. male who presents to the Emergency Department complaining of intermittent, mild neck pain that radiates into his left shoulder and arm onset 1 month ago, worse in the past 3 days. Pt states that he was diagnosed with a pinched nerve in his neck by  Dr. Aline Brochure who prescribed him gabapentin. He reports associated intermittent tingling in his left arm. He was taking Gabapentin for this pain but ran out this medication one month ago. He has applied warm compresses without relief. Per pt, his next appointment is 01/28/16. Denies taking steroids, physical therapy, or pain injections. Denies CP, SOB, fever, chills and HA. Also denies recent injuires  Past Medical History  Diagnosis Date  . Enlarged prostate   . Schizoaffective disorder ([place]) 11-13-14  . Bipolar disorder ([place]) age 75  . Anxiety 10-2014  . COPD (chronic obstructive pulmonary disease) ([place]) 2016  . Depression age 34  . Hyperlipidemia 2016  . Hypertension 2013  . Thyroid disease 11-2014   Past Surgical History  Procedure Laterality Date  . Hernia repair  2002   Family History  Problem Relation Age of Onset  . Diabetes Father   . Heart disease Father   . Heart disease Maternal Grandmother   . Stroke Maternal Grandfather    Social History  Substance Use Topics  . Smoking status: Current Every Day Smoker -- 0.50 packs/day for 32 years    Types: Cigarettes  . Smokeless tobacco: Never Used  . Alcohol Use: No    Review of Systems  Constitutional:  Negative for fever and chills.  Eyes: Negative for visual disturbance.  Respiratory: Negative for shortness of breath.   Cardiovascular: Negative for chest pain.  Gastrointestinal: Negative for nausea and vomiting.  Musculoskeletal: Positive for myalgias (radiation from neck into left arm) and neck pain. Negative for back pain, joint swelling and neck stiffness.  Skin: Negative for color change, rash and wound.  Neurological: Negative for dizziness, numbness and headaches.  All other systems reviewed and are negative.     Allergies  Desipramine  Home Medications   Prior to Admission medications   Medication Sig Start Date End Date Taking? Authorizing Provider  aspirin 325 MG tablet Take 650 mg by mouth daily as needed for mild pain or moderate pain.     Historical Provider, MD  atorvastatin (LIPITOR) 20 MG tablet Take 1 tablet (20 mg total) by mouth daily. 08/27/15   Soyla Dryer, PA-C  citalopram (CELEXA) 20 MG tablet Take 20 mg by mouth daily. For mood    Historical Provider, MD  gabapentin (NEURONTIN) 100 MG capsule Take 1 capsule (100 mg total) by mouth 3 (three) times daily. 07/25/15   Carole Civil, MD  gabapentin (NEURONTIN) 100 MG capsule Take 1 capsule (100 mg total) by mouth 3 (three) times daily. 09/09/15   Carole Civil, MD  haloperidol (HALDOL) 0.5 MG tablet Take 0.5 mg by mouth daily.    Historical Provider, MD  lovastatin (MEVACOR) 20 MG tablet Take 1 tablet (20  mg total) by mouth at bedtime. 05/28/15   Soyla Dryer, PA-C  pyridOXINE (VITAMIN B-6) 100 MG tablet Take 1 tablet (100 mg total) by mouth 2 (two) times daily. 09/09/15   Carole Civil, MD  traZODone (DESYREL) 100 MG tablet Take 100-200 mg by mouth at bedtime.    Historical Provider, MD   BP 143/91 mmHg  Pulse 97  Temp(Src) 98 F (36.7 C) (Oral)  Resp 20  Ht 5\' 9"  (1.753 m)  Wt 245 lb (111.131 kg)  BMI 36.16 kg/m2  SpO2 98% Physical Exam  Constitutional: He is oriented to person, place,  and time. He appears well-developed and well-nourished. No distress.  HENT:  Head: Normocephalic and atraumatic.  Eyes: Conjunctivae and EOM are normal.  Neck: Normal range of motion. No JVD present.  Cardiovascular: Normal rate, regular rhythm and intact distal pulses.   Pulmonary/Chest: Effort normal. No respiratory distress.  Musculoskeletal: Normal range of motion. He exhibits tenderness. He exhibits no edema.  Tenderness of the left cervical paraspinal muscles, trapezius muscles, and rhomboid muscles. 3/5 grip strength on the left. Sensation intact.  Neurological: He is alert and oriented to person, place, and time. He exhibits normal muscle tone. Coordination normal.  Skin: Skin is warm and dry.  Psychiatric: He has a normal mood and affect. His behavior is normal.  Nursing note and vitals reviewed.   ED Course  Procedures   DIAGNOSTIC STUDIES: Oxygen Saturation is 98% on RA, normal by my interpretation.    COORDINATION OF CARE: 4:12 PM Will prescribe pain medication. Advised at home symptomatic treatment with rest and ice. Advised pt to follow-up with Dr. Aline Brochure and PCP as needed. Will discharge pt. Discussed treatment plan with pt at bedside and pt agreed to plan.    MDM   Final diagnoses:  Cervical radicular pain   Likely acute on chronic neck pain.  No concerning sx's for emergent neurological process. Currently under Dr. Ruthe Mannan care for same.  Vitals stable.  Appears stable for d/c  I personally performed the services described in this documentation, which was scribed in my presence. The recorded information has been reviewed and is accurate.    Kem Parkinson, PA-C 01/01/16 [place], MD 01/01/16 1704

## 2015-12-30 NOTE — Discharge Instructions (Signed)

## 2016-01-28 ENCOUNTER — Ambulatory Visit: Payer: Self-pay | Admitting: Orthopedic Surgery

## 2016-02-10 ENCOUNTER — Ambulatory Visit (INDEPENDENT_AMBULATORY_CARE_PROVIDER_SITE_OTHER): Payer: Medicaid Other | Admitting: Orthopedic Surgery

## 2016-02-10 ENCOUNTER — Encounter: Payer: Self-pay | Admitting: Orthopedic Surgery

## 2016-02-10 VITALS — BP 149/93 | Ht 70.0 in | Wt 250.0 lb

## 2016-02-10 DIAGNOSIS — G5602 Carpal tunnel syndrome, left upper limb: Secondary | ICD-10-CM

## 2016-02-10 NOTE — Patient Instructions (Addendum)
Carpal Tunnel Release  Carpal tunnel release is a surgical procedure to relieve numbness and pain in your hand that are caused by carpal tunnel syndrome. Your carpal tunnel is a narrow, hollow space in your wrist. It passes between your wrist bones and a band of connective tissue (transverse carpal ligament). The nerve that supplies most of your hand (median nerve) passes through this space, and so do the connections between your fingers and the muscles of your arm (tendons). Carpal tunnel syndrome makes this space swell and become narrow, and this causes pain and numbness.  In carpal tunnel release surgery, a surgeon cuts through the transverse carpal ligament to make more room in the carpal tunnel space. You may have this surgery if other types of treatment have not worked.  LET YOUR HEALTH CARE PROVIDER KNOW ABOUT:  · Any allergies you have.  · All medicines you are taking, including vitamins, herbs, eye drops, creams, and over-the-counter medicines.  · Previous problems you or members of your family have had with the use of anesthetics.  · Any blood disorders you have.  · Previous surgeries you have had.  · Medical conditions you have.  RISKS AND COMPLICATIONS  Generally, this is a safe procedure. However, problems may occur, including:  · Bleeding.  · Infection.  · Injury to the median nerve.  · Need for additional surgery.  BEFORE THE PROCEDURE  · Ask your health care provider about:    Changing or stopping your regular medicines. This is especially important if you are taking diabetes medicines or blood thinners.    Taking medicines such as aspirin and ibuprofen. These medicines can thin your blood. Do not take these medicines before your procedure if your health care provider instructs you not to.  · Do not eat or drink anything after midnight on the night before the procedure or as directed by your health care provider.  · Plan to have someone take you home after the procedure.  PROCEDURE  · An IV tube may  be inserted into a vein.  · You will be given one of the following:    A medicine that numbs the wrist area (local anesthetic). You may also be given a medicine to make you relax (sedative).    A medicine that makes you go to sleep (general anesthetic).  · Your arm, hand, and wrist will be cleaned with a germ-killing solution (antiseptic).  · Your surgeon will make a surgical cut (incision) over the palm side of your wrist. The surgeon will pull aside the skin of your wrist to expose the carpal tunnel space.  · The surgeon will cut the transverse carpal ligament.  · The edges of the incision will be closed with stitches (sutures) or staples.  · A bandage (dressing) will be placed over your wrist and wrapped around your hand and wrist.  AFTER THE PROCEDURE  · You may spend some time in a recovery area.  · Your blood pressure, heart rate, breathing rate, and blood oxygen level will be monitored often until the medicines you were given have worn off.  · You will likely have some pain. You will be given pain medicine.  · You may need to wear a splint or a wrist brace over your dressing.     This information is not intended to replace advice given to you by your health care provider. Make sure you discuss any questions you have with your health care provider.     Document Released: 

## 2016-02-10 NOTE — Addendum Note (Signed)
Addended by: Baldomero Lamy B on: 02/10/2016 05:26 PM   Modules accepted: Orders, SmartSet

## 2016-02-10 NOTE — Progress Notes (Signed)
Chief Complaint  Patient presents with  . Follow-up    LEFT CARPAL AND CUBITAL TUNNEL, NECK PAIN    BP (!) 149/93   Ht 5\' 10"  (1.778 m)   Wt 250 lb (113.4 kg)   BMI 35.87 kg/m   Follow-up for Steve Romero history of pain paresthesias in his left hand primarily median nerve distribution was treated with vitamin B 6, gabapentin and wrist splinting with no improvement. He's had symptoms for over a year   Review of systems neck pain and some mild ulnar nerve symptoms  General appearance normal awake alert and oriented 3 mood and affect normal gait and station unremarkable. Tenderness is noted over the carpal tunnel normal range of motion in the wrist with weakness to grip strength.   He has weakness in his left upper extremity decreased sensation and would like to proceed with carpal tunnel release  Risks and benefits of procedure have been explained to him and we will arrange a date for him to have the release  Contact numbers [phone]

## 2016-02-18 NOTE — Patient Instructions (Signed)
Steve Romero  02/18/2016     @PREFPERIOPPHARMACY @   Your procedure is scheduled on 02/21/2016.  Report to [facility] at 6:15 A.M.  Call this number if you have problems the morning of surgery:  [phone]   Remember:  Do not eat food or drink liquids after midnight.  Take these medicines the morning of surgery with A SIP OF WATER : CELEXA, NEURONTIN AND HALDOL   Do not wear jewelry, make-up or nail polish.  Do not wear lotions, powders, or perfumes.  You may wear deoderant.  Do not shave 48 hours prior to surgery.  Men may shave face and neck.  Do not bring valuables to the hospital.  [facility] is not responsible for any belongings or valuables.  Contacts, dentures or bridgework may not be worn into surgery.  Leave your suitcase in the car.  After surgery it may be brought to your room.  For patients admitted to the hospital, discharge time will be determined by your treatment team.  Patients discharged the day of surgery will not be allowed to drive home.   Name and phone number of your driver:   FAMILY Special instructions: N/A  Please read over the following fact sheets that you were given. Care and Recovery After Surgery   General Anesthesia, Adult, Care After Refer to this sheet in the next few weeks. These instructions provide you with information on caring for yourself after your procedure. Your health care provider may also give you more specific instructions. Your treatment has been planned according to current medical practices, but problems sometimes occur. Call your health care provider if you have any problems or questions after your procedure. WHAT TO EXPECT AFTER THE PROCEDURE After the procedure, it is typical to experience:  Sleepiness.  Nausea and vomiting. HOME CARE INSTRUCTIONS  For the first 24 hours after general anesthesia:  Have a responsible person with you.  Do not drive a car. If you are alone, do not take public transportation.  Do  not drink alcohol.  Do not take medicine that has not been prescribed by your health care provider.  Do not sign important papers or make important decisions.  You may resume a normal diet and activities as directed by your health care provider.  Change bandages (dressings) as directed.  If you have questions or problems that seem related to general anesthesia, call the hospital and ask for the anesthetist or anesthesiologist on call. SEEK MEDICAL CARE IF:  You have nausea and vomiting that continue the day after anesthesia.  You develop a rash. SEEK IMMEDIATE MEDICAL CARE IF:   You have difficulty breathing.  You have chest pain.  You have any allergic problems.   This information is not intended to replace advice given to you by your health care provider. Make sure you discuss any questions you have with your health care provider.   Document Released: 09/28/2000 Document Revised: 07/13/2014 Document Reviewed: 10/21/2011 Elsevier Interactive Patient Education 2016 [place] Tunnel Release Carpal tunnel release is a surgical procedure to relieve numbness and pain in your hand that are caused by carpal tunnel syndrome. Your carpal tunnel is a narrow, hollow space in your wrist. It passes between your wrist bones and a band of connective tissue (transverse carpal ligament). The nerve that supplies most of your hand (median nerve) passes through this space, and so do the connections between your fingers and the muscles of your arm (tendons). Carpal tunnel syndrome makes this space  swell and become narrow, and this causes pain and numbness. In carpal tunnel release surgery, a surgeon cuts through the transverse carpal ligament to make more room in the carpal tunnel space. You may have this surgery if other types of treatment have not worked. LET [facility] CARE PROVIDER KNOW ABOUT:  Any allergies you have.  All medicines you are taking, including vitamins, herbs, eye  drops, creams, and over-the-counter medicines.  Previous problems you or members of your family have had with the use of anesthetics.  Any blood disorders you have.  Previous surgeries you have had.  Medical conditions you have. RISKS AND COMPLICATIONS Generally, this is a safe procedure. However, problems may occur, including:  Bleeding.  Infection.  Injury to the median nerve.  Need for additional surgery. BEFORE THE PROCEDURE  Ask your health care provider about:  Changing or stopping your regular medicines. This is especially important if you are taking diabetes medicines or blood thinners.  Taking medicines such as aspirin and ibuprofen. These medicines can thin your blood. Do not take these medicines before your procedure if your health care provider instructs you not to.  Do not eat or drink anything after midnight on the night before the procedure or as directed by your health care provider.  Plan to have someone take you home after the procedure. PROCEDURE  An IV tube may be inserted into a vein.  You will be given one of the following:  A medicine that numbs the wrist area (local anesthetic). You may also be given a medicine to make you relax (sedative).  A medicine that makes you go to sleep (general anesthetic).  Your arm, hand, and wrist will be cleaned with a germ-killing solution (antiseptic).  Your surgeon will make a surgical cut (incision) over the palm side of your wrist. The surgeon will pull aside the skin of your wrist to expose the carpal tunnel space.  The surgeon will cut the transverse carpal ligament.  The edges of the incision will be closed with stitches (sutures) or staples.  A bandage (dressing) will be placed over your wrist and wrapped around your hand and wrist. AFTER THE PROCEDURE  You may spend some time in a recovery area.  Your blood pressure, heart rate, breathing rate, and blood oxygen level will be monitored often until the  medicines you were given have worn off.  You will likely have some pain. You will be given pain medicine.  You may need to wear a splint or a wrist brace over your dressing.   This information is not intended to replace advice given to you by your health care provider. Make sure you discuss any questions you have with your health care provider.   Document Released: 09/12/2003 Document Revised: 07/13/2014 Document Reviewed: 02/07/2014 Elsevier Interactive Patient Education [employer].

## 2016-02-19 ENCOUNTER — Encounter (HOSPITAL_COMMUNITY): Payer: Self-pay

## 2016-02-19 ENCOUNTER — Encounter (HOSPITAL_COMMUNITY)
Admission: RE | Admit: 2016-02-19 | Discharge: 2016-02-19 | Disposition: A | Discharge status: Home / Self Care | Payer: Medicaid Other | Source: Ambulatory Visit | Attending: Orthopedic Surgery | Admitting: Orthopedic Surgery

## 2016-02-19 DIAGNOSIS — Z01810 Encounter for preprocedural cardiovascular examination: Secondary | ICD-10-CM | POA: Insufficient documentation

## 2016-02-19 DIAGNOSIS — Z01812 Encounter for preprocedural laboratory examination: Secondary | ICD-10-CM | POA: Diagnosis present

## 2016-02-19 LAB — CBC WITH DIFFERENTIAL/PLATELET
BASOS ABS: 0.1 10*3/uL (ref 0.0–0.1)
Basophils Relative: 1 %
EOS PCT: 2 %
Eosinophils Absolute: 0.2 10*3/uL (ref 0.0–0.7)
HEMATOCRIT: 51 % (ref 39.0–52.0)
Hemoglobin: 18.1 g/dL — ABNORMAL HIGH (ref 13.0–17.0)
LYMPHS PCT: 36 %
Lymphs Abs: 3.6 10*3/uL (ref 0.7–4.0)
MCH: 31.5 pg (ref 26.0–34.0)
MCHC: 35.5 g/dL (ref 30.0–36.0)
MCV: 88.7 fL (ref 78.0–100.0)
MONO ABS: 0.6 10*3/uL (ref 0.1–1.0)
MONOS PCT: 6 %
NEUTROS ABS: 5.6 10*3/uL (ref 1.7–7.7)
Neutrophils Relative %: 55 %
PLATELETS: 206 10*3/uL (ref 150–400)
RBC: 5.75 MIL/uL (ref 4.22–5.81)
RDW: 12.7 % (ref 11.5–15.5)
WBC: 9.9 10*3/uL (ref 4.0–10.5)

## 2016-02-19 LAB — BASIC METABOLIC PANEL
Anion gap: 8 (ref 5–15)
BUN: 19 mg/dL (ref 6–20)
CALCIUM: 8.9 mg/dL (ref 8.9–10.3)
CO2: 25 mmol/L (ref 22–32)
CREATININE: 1.52 mg/dL — AB (ref 0.61–1.24)
Chloride: 103 mmol/L (ref 101–111)
GFR calc Af Amer: >60 mL/min (ref >60)
GFR, EST NON AFRICAN AMERICAN: 54 mL/min — AB (ref >60)
GLUCOSE: 107 mg/dL — AB (ref 65–99)
Potassium: 3.9 mmol/L (ref 3.5–5.1)
Sodium: 136 mmol/L (ref 135–145)

## 2016-02-19 NOTE — Pre-Procedure Instructions (Signed)
Patient given information to sign up for myc ahrt at home.

## 2016-02-19 NOTE — Progress Notes (Signed)
   02/19/16 1001  OBSTRUCTIVE SLEEP APNEA  Have you ever been diagnosed with sleep apnea through a sleep study? No  Do you snore loudly (loud enough to be heard through closed doors)?  1  Do you often feel tired, fatigued, or sleepy during the daytime (such as falling asleep during driving or talking to someone)? 0  Has anyone observed you stop breathing during your sleep? 0  Do you have, or are you being treated for high blood pressure? 0  BMI more than 35 kg/m2? 1  Age > 50 (1-yes) 0  Neck circumference greater than:Male 16 inches or larger, Male 17inches or larger? 1  Male Gender (Yes=1) 1  Obstructive Sleep Apnea Score 4

## 2016-02-20 NOTE — H&P (Signed)
Carole Civil, MD  Orthopedics     Patient ID: Steve Romero, male   DOB: 11/01/1970, 45 y.o.   MRN: [id number]       Chief Complaint  Patient presents with  . Arm Pain      ER follow up Left arm pain and tingling from elbow to fingers, no known injury        HPI Steve Romero is a 45 y.o. male.  45 year old male presents from the emergency room with pain and paresthesias in his left upper extremity with no history of trauma. Symptoms started in his left elbow radiating down into his hand but now involve the entire hand with numbness and loss of sensation. Quality is dull aching burning sensation severe at times waxes and waning over several months time. Context again no trauma modifying factors he's been on low back and prednisone and he's worn the left wrist splint for the last 2 weeks     Review of Systems Review of Systems Constitutional symptoms none. Musculoskeletal findings mild weakness left upper extremity nerve symptoms as stated skin symptoms none       Past Medical History  Diagnosis Date  . Enlarged prostate    . Schizoaffective disorder ([place]) 11-13-14  . Bipolar disorder ([place]) age 89  . Anxiety 10-2014  . COPD (chronic obstructive pulmonary disease) ([place]) 2016  . Depression age 5  . Hyperlipidemia 2016  . Hypertension 2013  . Thyroid disease 11-2014           Past Surgical History  Procedure Laterality Date  . Hernia repair   2002           Family History  Problem Relation Age of Onset  . Diabetes Father    . Heart disease Father    . Heart disease Maternal Grandmother    . Stroke Maternal Grandfather        Social History      Social History  Substance Use Topics  . Smoking status: Current Every Day Smoker -- 0.50 packs/day for 32 years      Types: Cigarettes  . Smokeless tobacco: Never Used  . Alcohol Use: No          Allergies  Allergen Reactions  . Desipramine Nausea Only and Rash            Current Outpatient Prescriptions   Medication Sig Dispense Refill  . aspirin 325 MG tablet Take 650 mg by mouth daily as needed for mild pain or moderate pain.       . citalopram (CELEXA) 20 MG tablet Take 20 mg by mouth daily. For mood      . haloperidol (HALDOL) 0.5 MG tablet Take 0.5 mg by mouth daily.      Marland Kitchen lovastatin (MEVACOR) 20 MG tablet Take 1 tablet (20 mg total) by mouth at bedtime. (Patient taking differently: Take 20 mg by mouth daily. ) 30 tablet 4  . traZODone (DESYREL) 100 MG tablet Take 100-200 mg by mouth at bedtime.      . gabapentin (NEURONTIN) 100 MG capsule Take 1 capsule (100 mg total) by mouth 3 (three) times daily. 90 capsule 0    No current facility-administered medications for this visit.        Physical Exam Blood pressure 154/110, height 5\' 9"  (1.753 m), weight 240 lb (108.863 kg). Physical Exam The patient is well developed well nourished and well groomed.  Orientation to person place and time is normal  Mood is pleasant.  Ambulatory status normal noncontributory   Left Upper extremity examination reveals the following:   Inspection reveals no swelling. There is tenderness over the carpal tunnel.   Range of motion of the wrist and elbow are normal   Motor exam shows mild weakness with grip strength.   Wrist joint is stable   Provocative tests for carpal tunnel Phalen's test  positive Carpal tunnel compression test positive   We also no tenderness over the medial epicondyle and ulnar nerve with positive Tinel's     Pulses are normal in the radial and ulnar artery with a normal Allen's test.   Decreased sensation is noted in the median nerve distribution. Soft touch is normal.   Opposite extremity normal range of motion stability and strength   Data Reviewed  independent image interpretation :   No x-rays   Assessment    Left carpal tunnel syndrome     Plan  Left carpal tunnel release

## 2016-02-21 ENCOUNTER — Ambulatory Visit (HOSPITAL_COMMUNITY): Payer: Medicaid Other | Admitting: Anesthesiology

## 2016-02-21 ENCOUNTER — Encounter (HOSPITAL_COMMUNITY): Payer: Self-pay | Admitting: *Deleted

## 2016-02-21 ENCOUNTER — Ambulatory Visit (HOSPITAL_COMMUNITY)
Admission: RE | Admit: 2016-02-21 | Discharge: 2016-02-21 | Disposition: A | Discharge status: Home / Self Care | Payer: Medicaid Other | Source: Ambulatory Visit | Attending: Orthopedic Surgery | Admitting: Orthopedic Surgery

## 2016-02-21 ENCOUNTER — Encounter (HOSPITAL_COMMUNITY)
Admission: RE | Disposition: A | Discharge status: Home / Self Care | Payer: Self-pay | Source: Ambulatory Visit | Attending: Orthopedic Surgery

## 2016-02-21 DIAGNOSIS — Z888 Allergy status to other drugs, medicaments and biological substances status: Secondary | ICD-10-CM | POA: Insufficient documentation

## 2016-02-21 DIAGNOSIS — N400 Enlarged prostate without lower urinary tract symptoms: Secondary | ICD-10-CM | POA: Insufficient documentation

## 2016-02-21 DIAGNOSIS — F329 Major depressive disorder, single episode, unspecified: Secondary | ICD-10-CM | POA: Diagnosis not present

## 2016-02-21 DIAGNOSIS — F17200 Nicotine dependence, unspecified, uncomplicated: Secondary | ICD-10-CM | POA: Insufficient documentation

## 2016-02-21 DIAGNOSIS — Z7982 Long term (current) use of aspirin: Secondary | ICD-10-CM | POA: Diagnosis not present

## 2016-02-21 DIAGNOSIS — G5602 Carpal tunnel syndrome, left upper limb: Secondary | ICD-10-CM | POA: Diagnosis not present

## 2016-02-21 DIAGNOSIS — Z833 Family history of diabetes mellitus: Secondary | ICD-10-CM | POA: Insufficient documentation

## 2016-02-21 DIAGNOSIS — Z823 Family history of stroke: Secondary | ICD-10-CM | POA: Insufficient documentation

## 2016-02-21 DIAGNOSIS — F419 Anxiety disorder, unspecified: Secondary | ICD-10-CM | POA: Diagnosis not present

## 2016-02-21 DIAGNOSIS — Z8249 Family history of ischemic heart disease and other diseases of the circulatory system: Secondary | ICD-10-CM | POA: Insufficient documentation

## 2016-02-21 DIAGNOSIS — E079 Disorder of thyroid, unspecified: Secondary | ICD-10-CM | POA: Insufficient documentation

## 2016-02-21 DIAGNOSIS — I10 Essential (primary) hypertension: Secondary | ICD-10-CM | POA: Insufficient documentation

## 2016-02-21 DIAGNOSIS — Z79899 Other long term (current) drug therapy: Secondary | ICD-10-CM | POA: Insufficient documentation

## 2016-02-21 DIAGNOSIS — F250 Schizoaffective disorder, bipolar type: Secondary | ICD-10-CM | POA: Diagnosis not present

## 2016-02-21 DIAGNOSIS — J449 Chronic obstructive pulmonary disease, unspecified: Secondary | ICD-10-CM | POA: Insufficient documentation

## 2016-02-21 HISTORY — PX: CARPAL TUNNEL RELEASE: SHX101

## 2016-02-21 SURGERY — CARPAL TUNNEL RELEASE
Anesthesia: Regional | Site: Hand | Laterality: Left

## 2016-02-21 MED ORDER — ONDANSETRON HCL 4 MG/2ML IJ SOLN
INTRAMUSCULAR | Status: AC
  Filled 2016-02-21: qty 2

## 2016-02-21 MED ORDER — LIDOCAINE HCL (PF) 0.5 % IJ SOLN
INTRAMUSCULAR | Status: DC | PRN
  Administered 2016-02-21: 250 mg via INTRAVENOUS

## 2016-02-21 MED ORDER — FENTANYL CITRATE (PF) 100 MCG/2ML IJ SOLN
25.0000 ug | INTRAMUSCULAR | Status: AC | PRN
  Administered 2016-02-21 (×2): 25 ug via INTRAVENOUS

## 2016-02-21 MED ORDER — LIDOCAINE HCL (PF) 0.5 % IJ SOLN
INTRAMUSCULAR | Status: AC
  Filled 2016-02-21: qty 50

## 2016-02-21 MED ORDER — MIDAZOLAM HCL 2 MG/2ML IJ SOLN
1.0000 mg | INTRAMUSCULAR | Status: DC | PRN
  Administered 2016-02-21: 2 mg via INTRAVENOUS

## 2016-02-21 MED ORDER — HYDROMORPHONE HCL 1 MG/ML IJ SOLN: 0.2500 mg | INTRAMUSCULAR | Status: DC | PRN

## 2016-02-21 MED ORDER — BUPIVACAINE HCL (PF) 0.5 % IJ SOLN
INTRAMUSCULAR | Status: AC
  Filled 2016-02-21: qty 30

## 2016-02-21 MED ORDER — CEFAZOLIN SODIUM-DEXTROSE 2-4 GM/100ML-% IV SOLN
INTRAVENOUS | Status: AC
  Filled 2016-02-21: qty 100

## 2016-02-21 MED ORDER — 0.9 % SODIUM CHLORIDE (POUR BTL) OPTIME
TOPICAL | Status: DC | PRN
  Administered 2016-02-21: 300 mL

## 2016-02-21 MED ORDER — CHLORHEXIDINE GLUCONATE 4 % EX LIQD: 60.0000 mL | Freq: Once | CUTANEOUS | Status: DC

## 2016-02-21 MED ORDER — IBUPROFEN 800 MG PO TABS: 800.0000 mg | ORAL_TABLET | Freq: Three times a day (TID) | ORAL | 1 refills | Status: DC | PRN

## 2016-02-21 MED ORDER — CEFAZOLIN SODIUM-DEXTROSE 2-4 GM/100ML-% IV SOLN
2.0000 g | INTRAVENOUS | Status: AC
  Administered 2016-02-21: 2 g via INTRAVENOUS

## 2016-02-21 MED ORDER — FENTANYL CITRATE (PF) 100 MCG/2ML IJ SOLN
INTRAMUSCULAR | Status: AC
  Filled 2016-02-21: qty 2

## 2016-02-21 MED ORDER — MIDAZOLAM HCL 2 MG/2ML IJ SOLN
INTRAMUSCULAR | Status: AC
  Filled 2016-02-21: qty 2

## 2016-02-21 MED ORDER — PROPOFOL 500 MG/50ML IV EMUL
INTRAVENOUS | Status: DC | PRN
  Administered 2016-02-21: 50 ug/kg/min via INTRAVENOUS

## 2016-02-21 MED ORDER — HYDROCODONE-ACETAMINOPHEN 5-325 MG PO TABS: 1.0000 | ORAL_TABLET | ORAL | 0 refills | Status: DC | PRN

## 2016-02-21 MED ORDER — LACTATED RINGERS IV SOLN
INTRAVENOUS | Status: DC
  Administered 2016-02-21: 1000 mL via INTRAVENOUS

## 2016-02-21 MED ORDER — BUPIVACAINE HCL (PF) 0.5 % IJ SOLN
INTRAMUSCULAR | Status: DC | PRN
  Administered 2016-02-21: 10 mL

## 2016-02-21 MED ORDER — PROPOFOL 10 MG/ML IV BOLUS
INTRAVENOUS | Status: AC
  Filled 2016-02-21: qty 40

## 2016-02-21 MED ORDER — ONDANSETRON HCL 4 MG/2ML IJ SOLN
4.0000 mg | Freq: Once | INTRAMUSCULAR | Status: AC
  Administered 2016-02-21: 4 mg via INTRAVENOUS

## 2016-02-21 MED ORDER — MIDAZOLAM HCL 5 MG/5ML IJ SOLN
INTRAMUSCULAR | Status: DC | PRN
  Administered 2016-02-21: 2 mg via INTRAVENOUS

## 2016-02-21 SURGICAL SUPPLY — 39 items
BAG HAMPER (MISCELLANEOUS) ×2 IMPLANT
BANDAGE ACE 4 STERILE (GAUZE/BANDAGES/DRESSINGS) ×2 IMPLANT
BANDAGE ELASTIC 3 LF NS (GAUZE/BANDAGES/DRESSINGS) ×2 IMPLANT
BANDAGE ESMARK 4X12 BL STRL LF (DISPOSABLE) ×1 IMPLANT
BLADE SURG 15 STRL LF DISP TIS (BLADE) ×1 IMPLANT
BLADE SURG 15 STRL SS (BLADE) ×1
BNDG COHESIVE 4X5 TAN STRL (GAUZE/BANDAGES/DRESSINGS) ×2 IMPLANT
BNDG ESMARK 4X12 BLUE STRL LF (DISPOSABLE) ×2
BNDG GAUZE ELAST 4 BULKY (GAUZE/BANDAGES/DRESSINGS) ×2 IMPLANT
CHLORAPREP W/TINT 26ML (MISCELLANEOUS) ×2 IMPLANT
CLOTH BEACON ORANGE TIMEOUT ST (SAFETY) ×2 IMPLANT
COVER LIGHT HANDLE STERIS (MISCELLANEOUS) ×4 IMPLANT
CUFF TOURNIQUET SINGLE 18IN (TOURNIQUET CUFF) ×2 IMPLANT
DECANTER SPIKE VIAL GLASS SM (MISCELLANEOUS) ×2 IMPLANT
DRAPE PROXIMA HALF (DRAPES) ×2 IMPLANT
DRSG XEROFORM 1X8 (GAUZE/BANDAGES/DRESSINGS) ×2 IMPLANT
ELECT NEEDLE TIP 2.8 STRL (NEEDLE) ×2 IMPLANT
ELECT REM PT RETURN 9FT ADLT (ELECTROSURGICAL) ×2
ELECTRODE REM PT RTRN 9FT ADLT (ELECTROSURGICAL) ×1 IMPLANT
GAUZE SPONGE 4X4 12PLY STRL (GAUZE/BANDAGES/DRESSINGS) ×2 IMPLANT
GLOVE BIOGEL PI IND STRL 7.0 (GLOVE) ×1 IMPLANT
GLOVE BIOGEL PI INDICATOR 7.0 (GLOVE) ×1
GLOVE SKINSENSE NS SZ8.0 LF (GLOVE) ×1
GLOVE SKINSENSE STRL SZ8.0 LF (GLOVE) ×1 IMPLANT
GLOVE SS N UNI LF 8.5 STRL (GLOVE) ×2 IMPLANT
GOWN STRL REUS W/ TWL LRG LVL3 (GOWN DISPOSABLE) ×1 IMPLANT
GOWN STRL REUS W/TWL LRG LVL3 (GOWN DISPOSABLE) ×1
GOWN STRL REUS W/TWL XL LVL3 (GOWN DISPOSABLE) ×2 IMPLANT
HAND ALUMI XLG (SOFTGOODS) ×2 IMPLANT
KIT ROOM TURNOVER APOR (KITS) ×2 IMPLANT
MANIFOLD NEPTUNE II (INSTRUMENTS) ×2 IMPLANT
NEEDLE HYPO 21X1.5 SAFETY (NEEDLE) ×2 IMPLANT
NS IRRIG 1000ML POUR BTL (IV SOLUTION) ×2 IMPLANT
PACK BASIC LIMB (CUSTOM PROCEDURE TRAY) ×2 IMPLANT
PAD ARMBOARD 7.5X6 YLW CONV (MISCELLANEOUS) ×2 IMPLANT
PADDING WEBRIL 4 STERILE (GAUZE/BANDAGES/DRESSINGS) ×2 IMPLANT
SET BASIN LINEN APH (SET/KITS/TRAYS/PACK) ×2 IMPLANT
SUT ETHILON 3 0 FSL (SUTURE) ×2 IMPLANT
SYR CONTROL 10ML LL (SYRINGE) ×2 IMPLANT

## 2016-02-21 NOTE — Interval H&P Note (Signed)
History and Physical Interval Note:  02/21/2016 7:23 AM BP 122/89   Pulse 88   Temp 97.6 F (36.4 C) (Oral)   Resp 16   SpO2 96%  Skin clean at surgery site Steve Romero  has presented today for surgery, with the diagnosis of left carpal tunnel syndrome  The various methods of treatment have been discussed with the patient and family. After consideration of risks, benefits and other options for treatment, the patient has consented to  Procedure(s): CARPAL TUNNEL RELEASE (Left) as a surgical intervention .  The patient's history has been reviewed, patient examined, no change in status, stable for surgery.  I have reviewed the patient's chart and labs.  Questions were answered to the patient's satisfaction.     [name]

## 2016-02-21 NOTE — Op Note (Signed)
02/21/2016  8:04 AM  PATIENT:  Steve Romero  45 y.o. male  PRE-OPERATIVE DIAGNOSIS:  left carpal tunnel syndrome  POST-OPERATIVE DIAGNOSIS:  left carpal tunnel syndrome  Operative findings: Compression of the median nerve tight stenotic carpal tunnel. Apple core appearance at the transverse carpal ligament. Slight discoloration.  Carpal tunnel release left wrist  Preop diagnosis carpal tunnel syndrome left wrist   postop diagnosis same  Procedure open carpal tunnel release left wrist Surgeon Aline Brochure  Anesthesia regional Bier block  Operative findings compression of the left median nerve without any anterior carpal tunnel masses   Indications failure of conservative treatment to relieve pain and paresthesias and numbness and tingling of the left hand  The patient was identified in the preop area we confirm the surgical site marked as left wrist. Chart update completed. Patient taken to surgery. She had 2 g of Ancef. After establishing a Bier block left her arm was prepped with ChloraPrep.  Timeout executed completed and confirmed site.  A straight incision was made over the left carpal tunnel in line with the radial border of the ring finger. Blunt dissection was carried out to find the distal aspect of the carpal tunnel. A blunted instrument was passed beneath the carpal tunnel. Sharp incision was then used to release the transverse carpal ligament. The contents of the carpal tunnel were inspected. The median nerve was compressed with slight discoloration.  The wound was irrigated and then closed with 3-0 nylon suture. We injected 10 mL of plain Marcaine on the radial side of the incision  A sterile bandage was applied and the tourniquet was released the color of the hand and capillary refill were normal  The patient was taken to the recovery room in stable condition  PROCEDURE:  Procedure(s): CARPAL TUNNEL RELEASE (Left)  SURGEON:  Surgeon(s) and Role:    * Carole Civil, MD - Primary  PHYSICIAN ASSISTANT:   ASSISTANTS: none   ANESTHESIA:   regional  EBL:  Total I/O In: 200 [I.V.:200] Out: 0   BLOOD ADMINISTERED:none  DRAINS: none   LOCAL MEDICATIONS USED:  MARCAINE    and Amount: 10 ml  SPECIMEN:  No Specimen  DISPOSITION OF SPECIMEN:  N/A  COUNTS:  YES  TOURNIQUET:  * Missing tourniquet times found for documented tourniquets in log:  [id number] *  DICTATION: .Dragon Dictation  PLAN OF CARE: Discharge to home after PACU  PATIENT DISPOSITION:  PACU - hemodynamically stable.   Delay start of Pharmacological VTE agent (>24hrs) due to surgical blood loss or risk of bleeding: not applicable

## 2016-02-21 NOTE — Progress Notes (Signed)
Pt taught incentive spirometry. States he has had this with prior surgery. Verbalizes understanding.

## 2016-02-21 NOTE — Brief Op Note (Signed)
02/21/2016  8:04 AM  PATIENT:  Steve Romero  45 y.o. male  PRE-OPERATIVE DIAGNOSIS:  left carpal tunnel syndrome  POST-OPERATIVE DIAGNOSIS:  left carpal tunnel syndrome  PROCEDURE:  Procedure(s): CARPAL TUNNEL RELEASE (Left)  SURGEON:  Surgeon(s) and Role:    * Carole Civil, MD - Primary  PHYSICIAN ASSISTANT:   ASSISTANTS: none   ANESTHESIA:   regional  EBL:  Total I/O In: 200 [I.V.:200] Out: 0   BLOOD ADMINISTERED:none  DRAINS: none   LOCAL MEDICATIONS USED:  MARCAINE    and Amount: 10 ml  SPECIMEN:  No Specimen  DISPOSITION OF SPECIMEN:  N/A  COUNTS:  YES  TOURNIQUET:  * Missing tourniquet times found for documented tourniquets in log:  [id number] *  DICTATION: .Dragon Dictation  PLAN OF CARE: Discharge to home after PACU  PATIENT DISPOSITION:  PACU - hemodynamically stable.   Delay start of Pharmacological VTE agent (>24hrs) due to surgical blood loss or risk of bleeding: not applicable

## 2016-02-21 NOTE — Discharge Instructions (Signed)
Elevate the hand for 72 hours.  °Apply ice packs as needed to control swelling.  °Moves your fingers every hour opening and closing the hand to make a closed fist 10 times per hour while awake °Keep dressing clean and dry doctor will change in the office °

## 2016-02-21 NOTE — Anesthesia Preprocedure Evaluation (Signed)
Anesthesia Evaluation  Patient identified by MRN, date of birth, ID band Patient awake    Reviewed: Allergy & Precautions, NPO status , Patient's Chart, lab work & pertinent test results  Airway Mallampati: I  TM Distance: >3 FB Neck ROM: Full    Dental  (+) Edentulous Upper, Edentulous Lower   Pulmonary COPD, Current Smoker,    breath sounds clear to auscultation       Cardiovascular hypertension, Pt. on medications negative cardio ROS   Rhythm:Regular Rate:Normal     Neuro/Psych PSYCHIATRIC DISORDERS ( Schizoaffective disorder ) Bipolar Disorder    GI/Hepatic negative GI ROS,   Endo/Other    Renal/GU      Musculoskeletal   Abdominal   Peds  Hematology   Anesthesia Other Findings   Reproductive/Obstetrics                             Anesthesia Physical Anesthesia Plan  ASA: III  Anesthesia Plan: Bier Block   Post-op Pain Management:    Induction: Intravenous  Airway Management Planned: Simple Face Mask  Additional Equipment:   Intra-op Plan:   Post-operative Plan:   Informed Consent: I have reviewed the patients History and Physical, chart, labs and discussed the procedure including the risks, benefits and alternatives for the proposed anesthesia with the patient or authorized representative who has indicated his/her understanding and acceptance.     Plan Discussed with:   Anesthesia Plan Comments:         Anesthesia Quick Evaluation

## 2016-02-21 NOTE — Anesthesia Procedure Notes (Signed)
Anesthesia Regional Block:  Bier block (IV Regional)  Pre-Anesthetic Checklist: ,, timeout performed, Correct Patient, Correct Site, Correct Laterality, Correct Procedure,, site marked, surgical consent, pre-op evaluation, at surgeon's request Needles:  Injection technique: Single-shot  Needle Type: Other      Needle Gauge: 20 and 20 G    Additional Needles: Bier block (IV Regional)  Nerve Stimulator or Paresthesia:   Additional Responses:  Pulse checked post tourniquet inflation. IV NSL discontinued post injection. Narrative:  Start time: 02/21/2016 7:40 AM  Performed by: Personally

## 2016-02-21 NOTE — Anesthesia Postprocedure Evaluation (Signed)
Anesthesia Post Note  Patient: Steve Romero  Procedure(s) Performed: Procedure(s) (LRB): CARPAL TUNNEL RELEASE (Left)  Patient location during evaluation: PACU Anesthesia Type: Bier Block Level of consciousness: awake and alert, oriented and patient cooperative Pain management: pain level controlled Vital Signs Assessment: post-procedure vital signs reviewed and stable Respiratory status: spontaneous breathing, nonlabored ventilation and respiratory function stable Cardiovascular status: blood pressure returned to baseline and stable Postop Assessment: no signs of nausea or vomiting Anesthetic complications: no    Last Vitals:  Vitals:   02/21/16 0700 02/21/16 0715  BP: 122/89 129/89  Pulse:    Resp: (!) 26 16  Temp:      Last Pain:  Vitals:   02/21/16 0645  TempSrc: Oral                 Tiffanye Hartmann J

## 2016-02-21 NOTE — Transfer of Care (Signed)
Immediate Anesthesia Transfer of Care Note  Patient: Steve Romero  Procedure(s) Performed: Procedure(s): CARPAL TUNNEL RELEASE (Left)  Patient Location: PACU  Anesthesia Type:Bier block  Level of Consciousness: awake and patient cooperative  Airway & Oxygen Therapy: Patient Spontanous Breathing and Patient connected to face mask oxygen  Post-op Assessment: Report given to RN, Post -op Vital signs reviewed and stable and Patient moving all extremities  Post vital signs: Reviewed and stable  Last Vitals:  Vitals:   02/21/16 0700 02/21/16 0715  BP: 122/89 129/89  Pulse:    Resp: (!) 26 16  Temp:      Last Pain:  Vitals:   02/21/16 0645  TempSrc: Oral         Complications: No apparent anesthesia complications

## 2016-02-24 ENCOUNTER — Telehealth: Payer: Self-pay | Admitting: Radiology

## 2016-02-24 NOTE — Telephone Encounter (Signed)
Patient has [place] surgery on 02-21-16. He says that the Hydrocodone 5/325 is not helping him. He wants to know can he get something stronger?  If you cannot reach him at home, call [phone].

## 2016-02-24 NOTE — Telephone Encounter (Signed)
Take 800 mg of ibuprofen q 6 hrs

## 2016-02-26 ENCOUNTER — Encounter (HOSPITAL_COMMUNITY): Payer: Self-pay | Admitting: Orthopedic Surgery

## 2016-02-27 ENCOUNTER — Ambulatory Visit: Payer: Medicaid Other | Admitting: Orthopedic Surgery

## 2016-03-02 ENCOUNTER — Ambulatory Visit (INDEPENDENT_AMBULATORY_CARE_PROVIDER_SITE_OTHER): Payer: Medicaid Other | Admitting: Orthopedic Surgery

## 2016-03-02 ENCOUNTER — Encounter: Payer: Self-pay | Admitting: Orthopedic Surgery

## 2016-03-02 VITALS — BP 132/85 | HR 85 | Ht 70.0 in | Wt 240.0 lb

## 2016-03-02 DIAGNOSIS — Z4789 Encounter for other orthopedic aftercare: Secondary | ICD-10-CM

## 2016-03-02 NOTE — Progress Notes (Signed)
Patient ID: Steve Romero, male   DOB: [birth year], 45 y.o.   MRN: [id number]  Follow up visit  Chief Complaint  Patient presents with  . Follow-up    post op 1, LEFT CTR, DOS 02/21/16    Encounter Diagnosis  Name Primary?  . Surgical aftercare, musculoskeletal system Yes    BP 132/85   Pulse 85   Ht 5\' 10"  (1.778 m)   Wt 240 lb (108.9 kg)   BMI 34.44 kg/m  Surgical incision looks clean dry and intact remove sutures on Friday 1:49 PM [name], MD 03/02/2016

## 2016-03-06 ENCOUNTER — Ambulatory Visit (INDEPENDENT_AMBULATORY_CARE_PROVIDER_SITE_OTHER): Payer: Medicaid Other | Admitting: Orthopedic Surgery

## 2016-03-06 ENCOUNTER — Ambulatory Visit (INDEPENDENT_AMBULATORY_CARE_PROVIDER_SITE_OTHER): Payer: Medicaid Other

## 2016-03-06 ENCOUNTER — Encounter: Payer: Self-pay | Admitting: Orthopedic Surgery

## 2016-03-06 VITALS — BP 155/96 | HR 101 | Temp 97.5°F

## 2016-03-06 DIAGNOSIS — M5412 Radiculopathy, cervical region: Secondary | ICD-10-CM

## 2016-03-06 DIAGNOSIS — M542 Cervicalgia: Secondary | ICD-10-CM

## 2016-03-06 DIAGNOSIS — Z4789 Encounter for other orthopedic aftercare: Secondary | ICD-10-CM | POA: Diagnosis not present

## 2016-03-06 DIAGNOSIS — G5602 Carpal tunnel syndrome, left upper limb: Secondary | ICD-10-CM | POA: Diagnosis not present

## 2016-03-06 MED ORDER — GABAPENTIN 100 MG PO CAPS: 100.0000 mg | ORAL_CAPSULE | Freq: Three times a day (TID) | ORAL | 2 refills | Status: DC

## 2016-03-06 NOTE — Progress Notes (Signed)
Patient ID: Steve Romero, male   DOB: 05-06-1971, 45 y.o.   MRN: [id number]  Post op visit   Chief Complaint  Patient presents with  . Follow-up    post op 2, left carpal tunnel release, DOS 02/21/16    BP (!) 155/96   Pulse (!) 101   Temp 97.5 F (36.4 C)   Suture removal. Sutures taken out and wound is clean  Symptoms have resolved in the hand but as per nerve study he has a C8 radicular lesion on the nerve conduction study       ASSESSMENT AND PLAN   No prior x-rays of been done  Status post carpal tunnel release  C8 radiculopathy Continue gabapentin return 6 weeks

## 2016-03-06 NOTE — Progress Notes (Signed)
Chief complaint C8 radicular pain neck pain  History 45 year old male during the process of his workup for carpal tunnel syndrome was found to have CA radicular lesion by nerve conduction study  Complaint of neck pain  He had carpal tunnel surgery and has gotten good relief in the hand  Review of systems primarily neck pain no weakness in the hand other than from the surgery from stiffness but no frank weakness  Exam shows decreased range of motion and tenderness in the left side of the cervical spine. There is no instability. Muscle tone is normal. No skin lesions are seen in the neck area shoulder area. He has a good distal pulse and sensory-wise there is no CVA loss of sensation  His mood is pleasant his gait is normal he is oriented 3  X-ray cervical spine  Recommend continue gabapentin  Follow-up 6 weeks  Meds ordered this encounter  Medications  . gabapentin (NEURONTIN) 100 MG capsule    Sig: Take 1 capsule (100 mg total) by mouth 3 (three) times daily.    Dispense:  90 capsule    Refill:  2

## 2016-04-20 ENCOUNTER — Ambulatory Visit (INDEPENDENT_AMBULATORY_CARE_PROVIDER_SITE_OTHER): Payer: Medicaid Other | Admitting: Orthopedic Surgery

## 2016-04-20 ENCOUNTER — Encounter: Payer: Self-pay | Admitting: Orthopedic Surgery

## 2016-04-20 DIAGNOSIS — M545 Low back pain: Secondary | ICD-10-CM

## 2016-04-20 DIAGNOSIS — M5412 Radiculopathy, cervical region: Secondary | ICD-10-CM

## 2016-04-20 DIAGNOSIS — Z4789 Encounter for other orthopedic aftercare: Secondary | ICD-10-CM

## 2016-04-20 DIAGNOSIS — G8929 Other chronic pain: Secondary | ICD-10-CM

## 2016-04-20 DIAGNOSIS — M542 Cervicalgia: Secondary | ICD-10-CM

## 2016-04-20 DIAGNOSIS — G5602 Carpal tunnel syndrome, left upper limb: Secondary | ICD-10-CM
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
## 2016-04-28 ENCOUNTER — Telehealth (HOSPITAL_COMMUNITY): Payer: Self-pay
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
## 2016-04-29 ENCOUNTER — Ambulatory Visit (HOSPITAL_COMMUNITY): Payer: Medicaid Other | Admitting: Physical Therapy

## 2016-05-06 ENCOUNTER — Ambulatory Visit (HOSPITAL_COMMUNITY): Payer: Medicaid Other | Attending: Orthopedic Surgery | Admitting: Physical Therapy

## 2016-05-06 ENCOUNTER — Encounter (HOSPITAL_COMMUNITY): Payer: Self-pay | Admitting: Physical Therapy

## 2016-05-06 DIAGNOSIS — G8929 Other chronic pain: Secondary | ICD-10-CM | POA: Diagnosis present

## 2016-05-06 DIAGNOSIS — M5440 Lumbago with sciatica, unspecified side: Secondary | ICD-10-CM | POA: Diagnosis present

## 2016-05-06 DIAGNOSIS — R2689 Other abnormalities of gait and mobility: Secondary | ICD-10-CM | POA: Diagnosis present

## 2016-05-06 DIAGNOSIS — M6281 Muscle weakness (generalized): Secondary | ICD-10-CM | POA: Diagnosis present
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
## 2016-06-08 ENCOUNTER — Ambulatory Visit: Payer: Medicaid Other | Admitting: Orthopedic Surgery

## 2016-06-09 ENCOUNTER — Ambulatory Visit (INDEPENDENT_AMBULATORY_CARE_PROVIDER_SITE_OTHER): Payer: Medicaid Other | Admitting: Orthopedic Surgery

## 2016-06-09 ENCOUNTER — Encounter: Payer: Self-pay | Admitting: Orthopedic Surgery

## 2016-06-09 DIAGNOSIS — M545 Low back pain, unspecified: Secondary | ICD-10-CM

## 2016-06-09 DIAGNOSIS — G8929 Other chronic pain: Secondary | ICD-10-CM

## 2016-06-09 DIAGNOSIS — N400 Enlarged prostate without lower urinary tract symptoms: Secondary | ICD-10-CM

## 2016-06-09 DIAGNOSIS — M5412 Radiculopathy, cervical region: Secondary | ICD-10-CM | POA: Diagnosis not present

## 2016-06-09 MED ORDER — GABAPENTIN 100 MG PO CAPS: 100.0000 mg | ORAL_CAPSULE | Freq: Three times a day (TID) | ORAL | 2 refills | Status: DC

## 2016-06-09 NOTE — Progress Notes (Signed)
Patient ID: Steve Romero, male   DOB: 1971/06/04, 44 y.o.   MRN: [id number]  Chief Complaint  Patient presents with  . Follow-up    BACK PAIN    HPI [name] is a 45 y.o. male.   HPI  Follow-up visit status post one visit on to physical therapy to learn home exercises for his lower back.  He's learned those and they said help as long as he's doing them when he is not doing and he seems to have persistent lower back pain with occasional radiation in the posterior aspect of the left leg to the knee and very infrequent times to the foot.  The patient also complains of a mass over the right ulnar side of the wrist which was evaluated by another orthopedist with attempted aspiration which was negative, Medicaid denied his MRI 2 months ago.    Review of Systems Review of Systems  Appears to have a history of BPH has some starting and stopping when urinating  Bowel function intact  Physical Exam  Exam shows a large mass 3 x 2 cm over the  right ulnar side of the wrist it appears firmit has a red hue to it if not purplish hue it does not affect range of motion or strength  Encounter Diagnoses  Name Primary?  . Cervical radiculopathy at C8   . Benign prostatic hyperplasia, unspecified whether lower urinary tract symptoms present Yes  . Chronic bilateral low back pain without sciatica     We went ahead and got a urology consult for the BPH  Home exercises for his back  Continue gabapentin for his C8 radicular pain  Use he's he does necessary for his lower back  Follow-up as needed

## 2016-06-09 NOTE — Patient Instructions (Signed)
Urology consult   Home exercises for back   gabpentin for neck   Use heat as needed

## 2016-09-24 DIAGNOSIS — G5602 Carpal tunnel syndrome, left upper limb: Secondary | ICD-10-CM

## 2017-01-21 ENCOUNTER — Emergency Department (HOSPITAL_COMMUNITY)
Admission: EM | Admit: 2017-01-21 | Discharge: 2017-01-21 | Disposition: A | Discharge status: Home / Self Care | Payer: Medicaid Other | Attending: Emergency Medicine | Admitting: Emergency Medicine

## 2017-01-21 ENCOUNTER — Encounter (HOSPITAL_COMMUNITY): Payer: Self-pay | Admitting: *Deleted

## 2017-01-21 ENCOUNTER — Emergency Department (HOSPITAL_COMMUNITY): Payer: Medicaid Other

## 2017-01-21 DIAGNOSIS — F17210 Nicotine dependence, cigarettes, uncomplicated: Secondary | ICD-10-CM | POA: Diagnosis not present

## 2017-01-21 DIAGNOSIS — J449 Chronic obstructive pulmonary disease, unspecified: Secondary | ICD-10-CM | POA: Diagnosis not present

## 2017-01-21 DIAGNOSIS — K4090 Unilateral inguinal hernia, without obstruction or gangrene, not specified as recurrent: Secondary | ICD-10-CM | POA: Diagnosis not present

## 2017-01-21 DIAGNOSIS — R1032 Left lower quadrant pain: Secondary | ICD-10-CM | POA: Diagnosis present

## 2017-01-21 LAB — COMPREHENSIVE METABOLIC PANEL
ALBUMIN: 3.9 g/dL (ref 3.5–5.0)
ALK PHOS: 91 U/L (ref 38–126)
ALT: 35 U/L (ref 17–63)
AST: 21 U/L (ref 15–41)
Anion gap: 7 (ref 5–15)
BILIRUBIN TOTAL: 0.8 mg/dL (ref 0.3–1.2)
BUN: 13 mg/dL (ref 6–20)
CALCIUM: 8.9 mg/dL (ref 8.9–10.3)
CO2: 26 mmol/L (ref 22–32)
Chloride: 104 mmol/L (ref 101–111)
Creatinine, Ser: 1.14 mg/dL (ref 0.61–1.24)
GFR calc Af Amer: >60 mL/min (ref >60)
GFR calc non Af Amer: >60 mL/min (ref >60)
GLUCOSE: 98 mg/dL (ref 65–99)
Potassium: 4 mmol/L (ref 3.5–5.1)
Sodium: 137 mmol/L (ref 135–145)
TOTAL PROTEIN: 6.7 g/dL (ref 6.5–8.1)

## 2017-01-21 LAB — CBC WITH DIFFERENTIAL/PLATELET
BASOS PCT: 1 %
Basophils Absolute: 0.1 10*3/uL (ref 0.0–0.1)
EOS ABS: 0.1 10*3/uL (ref 0.0–0.7)
EOS PCT: 1 %
HEMATOCRIT: 49.8 % (ref 39.0–52.0)
Hemoglobin: 17.4 g/dL — ABNORMAL HIGH (ref 13.0–17.0)
Lymphocytes Relative: 38 %
Lymphs Abs: 3.3 10*3/uL (ref 0.7–4.0)
MCH: 30.7 pg (ref 26.0–34.0)
MCHC: 34.9 g/dL (ref 30.0–36.0)
MCV: 88 fL (ref 78.0–100.0)
MONO ABS: 0.7 10*3/uL (ref 0.1–1.0)
MONOS PCT: 8 %
Neutro Abs: 4.6 10*3/uL (ref 1.7–7.7)
Neutrophils Relative %: 52 %
Platelets: 216 10*3/uL (ref 150–400)
RBC: 5.66 MIL/uL (ref 4.22–5.81)
RDW: 12.9 % (ref 11.5–15.5)
WBC: 8.7 10*3/uL (ref 4.0–10.5)

## 2017-01-21 LAB — URINALYSIS, ROUTINE W REFLEX MICROSCOPIC
BILIRUBIN URINE: NEGATIVE
Glucose, UA: NEGATIVE mg/dL
Hgb urine dipstick: NEGATIVE
KETONES UR: NEGATIVE mg/dL
Leukocytes, UA: NEGATIVE
NITRITE: NEGATIVE
PH: 6 (ref 5.0–8.0)
Protein, ur: NEGATIVE mg/dL
Specific Gravity, Urine: 1.025 (ref 1.005–1.030)

## 2017-01-21 LAB — LIPASE, BLOOD: Lipase: 26 U/L (ref 11–51)

## 2017-01-21 MED ORDER — IOPAMIDOL (ISOVUE-300) INJECTION 61%
100.0000 mL | Freq: Once | INTRAVENOUS | Status: AC | PRN
  Administered 2017-01-21: 100 mL via INTRAVENOUS

## 2017-01-21 MED ORDER — TRAMADOL HCL 50 MG PO TABS: 50.0000 mg | ORAL_TABLET | Freq: Four times a day (QID) | ORAL | 0 refills | Status: DC | PRN

## 2017-01-21 MED ORDER — IBUPROFEN 800 MG PO TABS: 800.0000 mg | ORAL_TABLET | Freq: Three times a day (TID) | ORAL | 0 refills | Status: DC | PRN

## 2017-01-21 MED ORDER — SODIUM CHLORIDE 0.9 % IV BOLUS (SEPSIS)
500.0000 mL | Freq: Once | INTRAVENOUS | Status: AC
  Administered 2017-01-21: 500 mL via INTRAVENOUS

## 2017-01-21 NOTE — ED Triage Notes (Signed)
Pain in left groin area for several days

## 2017-01-21 NOTE — ED Provider Notes (Signed)
Emergency Department Provider Note   I have reviewed the triage vital signs and the nursing notes.   HISTORY  Chief Complaint Groin Pain   HPI Steve Romero is a 46 y.o. male with PMH of COPD, Bipolar disorder, HTN, and HLD presents to the emergency room in for evaluation of left inguinal pain with small mass palpable. Patient reports pain onset 2-3 days ago and worsening. Pain is increased with having bowel movements. No rectal pain. No testicular pain. No radiation of symptoms. Known history of hernia. The patient called his urologist to has an appointment later in the week but recommended he come to the emergency department for further evaluation. Patient denies any chest pain at all to breathing. No over-the-counter medication improving symptoms.    Past Medical History:  Diagnosis Date  . Anxiety 10-2014  . Bipolar disorder ([place]) age 16  . COPD (chronic obstructive pulmonary disease) ([place]) 2016  . Depression age 56  . Enlarged prostate   . Hyperlipidemia 2016  . Hypertension 2013  . Schizoaffective disorder ([place]) 11-13-14  . Thyroid disease 11-2014    Patient Active Problem List   Diagnosis Date Noted  . Carpal tunnel syndrome, left   . Obesity, unspecified 09/01/2015  . HTN (hypertension) 04/22/2015  . Hyperlipidemia 04/22/2015  . Schizoaffective disorder ([place]) 04/22/2015  . Anxiety state 04/22/2015  . Cigarette nicotine dependence without complication 26/94/8546  . Edema 04/22/2015  . Lymphadenopathy 04/22/2015    Past Surgical History:  Procedure Laterality Date  . CARPAL TUNNEL RELEASE Left 02/21/2016   Procedure: CARPAL TUNNEL RELEASE;  Surgeon: Carole Civil, MD;  Location: [facility];  Service: Orthopedics;  Laterality: Left;  . HERNIA REPAIR Left 2002   groin    Current Outpatient Rx  . Order #: [id number] Class: Historical Med  . Order #: [id number] Class: Historical Med  . Order #: [id number] Class: Historical Med  . Order #: [id number] Class: Normal    . Order #: [id number] Class: Print  . Order #: [id number] Class: Print    Allergies Desipramine  Family History  Problem Relation Age of Onset  . Diabetes Father   . Heart disease Father   . Heart disease Maternal Grandmother   . Stroke Maternal Grandfather     Social History Social History  Substance Use Topics  . Smoking status: Current Every Day Smoker    Packs/day: 0.25    Years: 32.00    Types: Cigarettes  . Smokeless tobacco: Never Used  . Alcohol use No    Review of Systems  Constitutional: No fever/chills Eyes: No visual changes. ENT: No sore throat. Cardiovascular: Denies chest pain. Respiratory: Denies shortness of breath. Gastrointestinal: Positive left inguinal abdominal pain.  No nausea, no vomiting.  No diarrhea.  No constipation. Genitourinary: Negative for dysuria. Musculoskeletal: Negative for back pain. Skin: Negative for rash. Neurological: Negative for headaches, focal weakness or numbness.  10-point ROS otherwise negative.  ____________________________________________   PHYSICAL EXAM:  VITAL SIGNS: ED Triage Vitals  Enc Vitals Group     BP 01/21/17 1409 (!) 166/117     Pulse Rate 01/21/17 1409 97     Resp 01/21/17 1409 18     Temp 01/21/17 1409 98.3 F (36.8 C)     Temp src --      SpO2 01/21/17 1409 97 %     Weight 01/21/17 1407 245 lb (111.1 kg)     Height 01/21/17 1407 5\' 10"  (1.778 m)     Pain Score 01/21/17 1407 4   Constitutional: Alert  and oriented. Well appearing and in no acute distress. Eyes: Conjunctivae are normal.  Head: Atraumatic. Nose: No congestion/rhinnorhea. Mouth/Throat: Mucous membranes are moist.  Oropharynx non-erythematous. Neck: No stridor.   Cardiovascular: Normal rate, regular rhythm. Good peripheral circulation. Grossly normal heart sounds.   Respiratory: Normal respiratory effort.  No retractions. Lungs CTAB. Gastrointestinal: Soft and nontender. Positive left inguinal tenderness to palpation with a  palpable, hard mass. No overlying skin changes. Mass is not reducible. No distention.  Musculoskeletal: No lower extremity tenderness nor edema. No gross deformities of extremities. Neurologic:  Normal speech and language. No gross focal neurologic deficits are appreciated.  Skin:  Skin is warm, dry and intact. No rash noted.  ____________________________________________   LABS (all labs ordered are listed, but only abnormal results are displayed)  Labs Reviewed  CBC WITH DIFFERENTIAL/PLATELET - Abnormal; Notable for the following:       Result Value   Hemoglobin 17.4 (*)    All other components within normal limits  COMPREHENSIVE METABOLIC PANEL  LIPASE, BLOOD  URINALYSIS, ROUTINE W REFLEX MICROSCOPIC   ____________________________________________  RADIOLOGY  Ct Abdomen Pelvis W Contrast  Result Date: 01/21/2017 CLINICAL DATA:  Left inguinal pain for several days. History of a hernia repair. EXAM: CT ABDOMEN AND PELVIS WITH CONTRAST TECHNIQUE: Multidetector CT imaging of the abdomen and pelvis was performed using the standard protocol following bolus administration of intravenous contrast. CONTRAST:  169mL ISOVUE-300 IOPAMIDOL (ISOVUE-300) INJECTION 61% COMPARISON:  09/11/2012 FINDINGS: Lower chest: No acute abnormality. Hepatobiliary: No focal liver abnormality is seen. No gallstones, gallbladder wall thickening, or biliary dilatation. Pancreas: Unremarkable. No pancreatic ductal dilatation or surrounding inflammatory changes. Spleen: Normal in size without focal abnormality. Adrenals/Urinary Tract: Adrenal glands are unremarkable. Kidneys are normal, without renal calculi, focal lesion, or hydronephrosis. Bladder is unremarkable. Stomach/Bowel: Stomach is within normal limits. Appendix appears normal. No evidence of bowel wall thickening, distention, or inflammatory changes. Vascular/Lymphatic: No significant vascular findings are present. No enlarged abdominal or pelvic lymph nodes.  Reproductive: Unremarkable. Other: Fat protrudes through the right inguinal ring, which has increased in amount since the prior CT. No bowel enters this. No other abdominal wall hernia. No ascites. Musculoskeletal: No fracture or acute finding. No osteoblastic or osteolytic lesions. Disc degenerative changes noted at L3-L4 stable from the prior CT. IMPRESSION: 1. Fat containing right inguinal hernia is slightly larger than it was on the prior CT. No bowel enters this hernia. There is no other abdominal wall hernia. 2. No other significant abnormalities.  No acute findings. Electronically Signed   By: Lajean Manes M.D.   On: 01/21/2017 18:12    ____________________________________________   PROCEDURES  Procedure(s) performed:   Procedures  None ____________________________________________   INITIAL IMPRESSION / ASSESSMENT AND PLAN / ED COURSE  Pertinent labs & imaging results that were available during my care of the patient were reviewed by me and considered in my medical decision making (see chart for details).  Patient resents emergency pertinent for evaluation of left inguinal pain with a small, nonreducible mass. Symptoms most consistent with an indirect inguinal hernia. No testicular pain or abnormality on exam. Plan for CT abdomen pelvis to evaluate for hernia.  06:49 PM Patient with fat-containing hernia on CT. No involvement of bowel. Plan for discharge home with medication for pain control and outpatient referral to general surgery.   At this time, I do not feel there is any life-threatening condition present. I have reviewed and discussed all results (EKG, imaging, lab, urine as appropriate), exam  findings with patient. I have reviewed nursing notes and appropriate previous records.  I feel the patient is safe to be discharged home without further emergent workup. Discussed usual and customary return precautions. Patient and family (if present) verbalize understanding and are  comfortable with this plan.  Patient will follow-up with their primary care provider. If they do not have a primary care provider, information for follow-up has been provided to them. All questions have been answered.  ____________________________________________  FINAL CLINICAL IMPRESSION(S) / ED DIAGNOSES  Final diagnoses:  Left inguinal hernia     MEDICATIONS GIVEN DURING THIS VISIT:  Medications  sodium chloride 0.9 % bolus 500 mL (500 mLs Intravenous New Bag/Given 01/21/17 1555)  iopamidol (ISOVUE-300) 61 % injection 100 mL (100 mLs Intravenous Contrast Given 01/21/17 1755)     NEW OUTPATIENT MEDICATIONS STARTED DURING THIS VISIT:  New Prescriptions   IBUPROFEN (ADVIL,MOTRIN) 800 MG TABLET    Take 1 tablet (800 mg total) by mouth every 8 (eight) hours as needed.   TRAMADOL (ULTRAM) 50 MG TABLET    Take 1 tablet (50 mg total) by mouth every 6 (six) hours as needed.      Note:  This document was prepared using Dragon voice recognition software and may include unintentional dictation errors.  Nanda Quinton, MD Emergency Medicine   Long, [facility], MD 01/21/17 [phone]

## 2017-01-21 NOTE — Discharge Instructions (Signed)
You were seen in the ED today with a hernia that contains only fat. I am providing some pain medication for use at home and have given the contact information for a local surgeon. You should call the office for an appointment if symptoms worsen.   Return to the ED with any worsening pain, fever, chills, or if the mass gets suddenly much larger.

## 2017-02-23 ENCOUNTER — Encounter: Payer: Self-pay | Admitting: General Surgery

## 2017-02-23 ENCOUNTER — Ambulatory Visit (INDEPENDENT_AMBULATORY_CARE_PROVIDER_SITE_OTHER): Payer: Medicaid Other | Admitting: General Surgery

## 2017-02-23 VITALS — BP 152/104 | HR 83 | Temp 97.4°F | Resp 18 | Ht 69.0 in | Wt 250.0 lb

## 2017-02-23 DIAGNOSIS — K4091 Unilateral inguinal hernia, without obstruction or gangrene, recurrent: Secondary | ICD-10-CM | POA: Diagnosis not present

## 2017-02-23 NOTE — Progress Notes (Signed)
Steve Romero; [id number]; Oct 14, 1970   HPI Patient is a 46 year old white male who was referred to my care by the emergency room for evaluation and treatment of left groin pain. He was told that he had a left inguinal hernia. He states he had a left inguinal herniorrhaphy in the remote past at another facility. He states that over the past few months, he has had worsening left groin pain. He does note a small swelling when he is straining. Movement can make the hernia worse. No fever, chills, nausea, vomiting have been noted. His primary care physician is Dr. Edrick Oh. Past Medical History:  Diagnosis Date  . Anxiety 10-2014  . Bipolar disorder ([place]) age 32  . COPD (chronic obstructive pulmonary disease) ([place]) 2016  . Depression age 14  . Enlarged prostate   . Hyperlipidemia 2016  . Hypertension 2013  . Schizoaffective disorder ([place]) 11-13-14  . Thyroid disease 11-2014    Past Surgical History:  Procedure Laterality Date  . CARPAL TUNNEL RELEASE Left 02/21/2016   Procedure: CARPAL TUNNEL RELEASE;  Surgeon: Carole Civil, MD;  Location: [facility];  Service: Orthopedics;  Laterality: Left;  . HERNIA REPAIR Left 2002   groin    Family History  Problem Relation Age of Onset  . Diabetes Father   . Heart disease Father   . Heart disease Maternal Grandmother   . Stroke Maternal Grandfather     Current Outpatient Prescriptions on File Prior to Visit  Medication Sig Dispense Refill  . aspirin 325 MG tablet Take 650 mg by mouth daily as needed for mild pain or moderate pain.     Marland Kitchen gabapentin (NEURONTIN) 100 MG capsule Take 1 capsule (100 mg total) by mouth 3 (three) times daily. (Patient not taking: Reported on 01/21/2017) 90 capsule 2  . HydrOXYzine Pamoate (VISTARIL PO) Take 50 mg by mouth 3 (three) times daily.     Marland Kitchen ibuprofen (ADVIL,MOTRIN) 800 MG tablet Take 1 tablet (800 mg total) by mouth every 8 (eight) hours as needed. 21 tablet 0  . lurasidone (LATUDA) 20 MG TABS tablet Take 20  mg by mouth at bedtime.    . traMADol (ULTRAM) 50 MG tablet Take 1 tablet (50 mg total) by mouth every 6 (six) hours as needed. 15 tablet 0   No current facility-administered medications on file prior to visit.     Allergies  Allergen Reactions  . Desipramine Nausea Only and Rash    History  Alcohol Use No    History  Smoking Status  . Current Every Day Smoker  . Packs/day: 0.25  . Years: 32.00  . Types: Cigarettes  Smokeless Tobacco  . Never Used    Review of Systems  Constitutional: Negative.   HENT: Negative.   Eyes: Negative.   Respiratory: Negative.   Cardiovascular: Negative.   Gastrointestinal: Positive for abdominal pain.  Genitourinary: Positive for dysuria, frequency and urgency.  Musculoskeletal: Positive for back pain.  Skin: Negative.   Neurological: Negative.   Endo/Heme/Allergies: Negative.   Psychiatric/Behavioral: The patient is nervous/anxious.     Objective   Vitals:   02/23/17 1048  BP: (!) 152/104  Pulse: 83  Resp: 18  Temp: (!) 97.4 F (36.3 C)    Physical Exam  Constitutional: He is oriented to person, place, and time and well-developed, well-nourished, and in no distress.  HENT:  Head: Normocephalic and atraumatic.  Neck: Normal range of motion. Neck supple.  Cardiovascular: Normal rate, regular rhythm and normal heart sounds.  Exam reveals  no gallop and no friction rub.   No murmur heard. Pulmonary/Chest: Effort normal and breath sounds normal. No respiratory distress. He has no wheezes. He has no rales.  Abdominal: Soft. Bowel sounds are normal. He exhibits no distension. There is tenderness. There is no rebound and no guarding.  Small left inguinal hernia which is reducible. Surgical scar present.   Genitourinary:  Genitourinary Comments: Genitourinary examination is within normal limits.  Neurological: He is alert and oriented to person, place, and time.  Skin: Skin is warm and dry.  Vitals reviewed.  ER notes  reviewed Assessment  Recurrent left inguinal hernia, uncontrolled hypertension Plan   I told the patient once his blood pressure is under better control, would proceed with recurrent left inguinal herniorrhaphy with mesh. The risks and benefits of the procedure including bleeding, infection, mesh use, and the possibility of recurrence of the hernia were fully explained to the patient, who gave informed consent. Patient will contact Dr. Edrick Oh to manage his hypertension as he is on no blood pressure medication.

## 2017-02-23 NOTE — Patient Instructions (Signed)
Inguinal Hernia, Adult An inguinal hernia is when fat or the intestines push through the area where the leg meets the lower belly (groin) and make a rounded lump (bulge). This condition happens over time. There are three types of inguinal hernias. These types include:  Hernias that can be pushed back into the belly (are reducible).  Hernias that cannot be pushed back into the belly (are incarcerated).  Hernias that cannot be pushed back into the belly and lose their blood supply (get strangulated). This type needs emergency surgery.  Follow these instructions at home: Lifestyle  Drink enough fluid to keep your urine (pee) clear or pale yellow.  Eat plenty of fruits, vegetables, and whole grains. These have a lot of fiber. Talk with your doctor if you have questions.  Avoid lifting heavy objects.  Avoid standing for long periods of time.  Do not use tobacco products. These include cigarettes, chewing tobacco, or e-cigarettes. If you need help quitting, ask your doctor.  Try to stay at a healthy weight. General instructions  Do not try to force the hernia back in.  Watch your hernia for any changes in color or size. Let your doctor know if there are any changes.  Take over-the-counter and prescription medicines only as told by your doctor.  Keep all follow-up visits as told by your doctor. This is important. Contact a doctor if:  You have a fever.  You have new symptoms.  Your symptoms get worse. Get help right away if:  The area where the legs meets the lower belly has: ? Pain that gets worse suddenly. ? A bulge that gets bigger suddenly and does not go down. ? A bulge that turns red or purple. ? A bulge that is painful to the touch.  You are a man and your scrotum: ? Suddenly feels painful. ? Suddenly changes in size.  You feel sick to your stomach (nauseous) and this feeling does not go away.  You throw up (vomit) and this keeps happening.  You feel your heart  beating a lot more quickly than normal.  You cannot poop (have a bowel movement) or pass gas. This information is not intended to replace advice given to you by your health care provider. Make sure you discuss any questions you have with your health care provider. Document Released: 07/23/2006 Document Revised: 11/28/2015 Document Reviewed: 05/02/2014 Elsevier Interactive Patient Education  2018 [employer]  

## 2017-03-25 ENCOUNTER — Ambulatory Visit (INDEPENDENT_AMBULATORY_CARE_PROVIDER_SITE_OTHER): Payer: Medicaid Other | Admitting: General Surgery

## 2017-03-25 ENCOUNTER — Encounter: Payer: Self-pay | Admitting: General Surgery

## 2017-03-25 VITALS — BP 140/92 | HR 75 | Temp 97.5°F | Resp 18 | Ht 70.0 in | Wt 245.0 lb

## 2017-03-25 DIAGNOSIS — K4091 Unilateral inguinal hernia, without obstruction or gangrene, recurrent: Secondary | ICD-10-CM | POA: Diagnosis not present

## 2017-03-25 NOTE — Progress Notes (Signed)
Subjective:     Steve Romero  Here for follow-up of his blood pressure. We have received a note from his primary care physician stating his blood pressure is under better control. Patient is still having left groin pain with exertion and straining. Objective:    BP (!) 140/92   Pulse 75   Temp (!) 97.5 F (36.4 C)   Resp 18   Ht 5\' 10"  (1.778 m)   Wt 245 lb (111.1 kg)   BMI 35.15 kg/m   General:  alert, cooperative and no distress       Assessment:    Recurrent left inguinal hernia, blood pressure under better control.    Plan:   Will proceed with a left inguinal herniorrhaphy with mesh, recurrent on 04/05/2017. The risks and benefits of the procedure including bleeding, infection, mesh use, and the possibility of recurrence of the hernia were fully explained to the patient, who gave informed consent.

## 2017-03-26 NOTE — H&P (Signed)
Steve Romero; [id number]; Oct 14, 1970   HPI Patient is a 46 year old white male who was referred to my care by the emergency room for evaluation and treatment of left groin pain. He was told that he had a left inguinal hernia. He states he had a left inguinal herniorrhaphy in the remote past at another facility. He states that over the past few months, he has had worsening left groin pain. He does note a small swelling when he is straining. Movement can make the hernia worse. No fever, chills, nausea, vomiting have been noted. His primary care physician is Dr. Edrick Oh. Past Medical History:  Diagnosis Date  . Anxiety 10-2014  . Bipolar disorder ([place]) age 32  . COPD (chronic obstructive pulmonary disease) ([place]) 2016  . Depression age 14  . Enlarged prostate   . Hyperlipidemia 2016  . Hypertension 2013  . Schizoaffective disorder ([place]) 11-13-14  . Thyroid disease 11-2014    Past Surgical History:  Procedure Laterality Date  . CARPAL TUNNEL RELEASE Left 02/21/2016   Procedure: CARPAL TUNNEL RELEASE;  Surgeon: Carole Civil, MD;  Location: [facility];  Service: Orthopedics;  Laterality: Left;  . HERNIA REPAIR Left 2002   groin    Family History  Problem Relation Age of Onset  . Diabetes Father   . Heart disease Father   . Heart disease Maternal Grandmother   . Stroke Maternal Grandfather     Current Outpatient Prescriptions on File Prior to Visit  Medication Sig Dispense Refill  . aspirin 325 MG tablet Take 650 mg by mouth daily as needed for mild pain or moderate pain.     Marland Kitchen gabapentin (NEURONTIN) 100 MG capsule Take 1 capsule (100 mg total) by mouth 3 (three) times daily. (Patient not taking: Reported on 01/21/2017) 90 capsule 2  . HydrOXYzine Pamoate (VISTARIL PO) Take 50 mg by mouth 3 (three) times daily.     Marland Kitchen ibuprofen (ADVIL,MOTRIN) 800 MG tablet Take 1 tablet (800 mg total) by mouth every 8 (eight) hours as needed. 21 tablet 0  . lurasidone (LATUDA) 20 MG TABS tablet Take 20  mg by mouth at bedtime.    . traMADol (ULTRAM) 50 MG tablet Take 1 tablet (50 mg total) by mouth every 6 (six) hours as needed. 15 tablet 0   No current facility-administered medications on file prior to visit.     Allergies  Allergen Reactions  . Desipramine Nausea Only and Rash    History  Alcohol Use No    History  Smoking Status  . Current Every Day Smoker  . Packs/day: 0.25  . Years: 32.00  . Types: Cigarettes  Smokeless Tobacco  . Never Used    Review of Systems  Constitutional: Negative.   HENT: Negative.   Eyes: Negative.   Respiratory: Negative.   Cardiovascular: Negative.   Gastrointestinal: Positive for abdominal pain.  Genitourinary: Positive for dysuria, frequency and urgency.  Musculoskeletal: Positive for back pain.  Skin: Negative.   Neurological: Negative.   Endo/Heme/Allergies: Negative.   Psychiatric/Behavioral: The patient is nervous/anxious.     Objective   Vitals:   02/23/17 1048  BP: (!) 152/104  Pulse: 83  Resp: 18  Temp: (!) 97.4 F (36.3 C)    Physical Exam  Constitutional: He is oriented to person, place, and time and well-developed, well-nourished, and in no distress.  HENT:  Head: Normocephalic and atraumatic.  Neck: Normal range of motion. Neck supple.  Cardiovascular: Normal rate, regular rhythm and normal heart sounds.  Exam reveals  no gallop and no friction rub.   No murmur heard. Pulmonary/Chest: Effort normal and breath sounds normal. No respiratory distress. He has no wheezes. He has no rales.  Abdominal: Soft. Bowel sounds are normal. He exhibits no distension. There is tenderness. There is no rebound and no guarding.  Small left inguinal hernia which is reducible. Surgical scar present.   Genitourinary:  Genitourinary Comments: Genitourinary examination is within normal limits.  Neurological: He is alert and oriented to person, place, and time.  Skin: Skin is warm and dry.  Vitals reviewed.  ER notes  reviewed Assessment  Recurrent left inguinal hernia, uncontrolled hypertension Plan   Patient is scheduled for recurrent left renal herniorrhaphy with mesh on 04/05/2017. The risks and benefits of the procedure including bleeding, infection, mesh use, and the possibility of recurrence of the hernia were fully explained to the patient, who gave informed consent. He has been cleared by his primary care physician as he has seen him multiple times to control his hypertension.

## 2017-03-30 NOTE — Patient Instructions (Signed)
Steve Romero  03/30/2017     @PREFPERIOPPHARMACY @   Your procedure is scheduled on  04/05/2017   Report to [facility] at  615   A.M.  Call this number if you have problems the morning of surgery:  [phone]   Remember:  Do not eat food or drink liquids after midnight.  Take these medicines the morning of surgery with A SIP OF WATER  Norvasc, propranolol.   Do not wear jewelry, make-up or nail polish.  Do not wear lotions, powders, or perfumes, or deoderant.  Do not shave 48 hours prior to surgery.  Men may shave face and neck.  Do not bring valuables to the hospital.  [facility] is not responsible for any belongings or valuables.  Contacts, dentures or bridgework may not be worn into surgery.  Leave your suitcase in the car.  After surgery it may be brought to your room.  For patients admitted to the hospital, discharge time will be determined by your treatment team.  Patients discharged the day of surgery will not be allowed to drive home.   Name and phone number of your driver:   family Special instructions:  None  Please read over the following fact sheets that you were given. Anesthesia Post-op Instructions and Care and Recovery After Surgery       Open Hernia Repair, Adult Open hernia repair is a surgical procedure to fix a hernia. A hernia occurs when an internal organ or tissue pushes out through a weak spot in the abdominal wall muscles. Hernias commonly occur in the groin and around the navel. Most hernias tend to get worse over time. Often, surgery is done to prevent the hernia from becoming bigger, uncomfortable, or an emergency. Emergency surgery may be needed if abdominal contents get stuck in the opening (incarcerated hernia) or the blood supply gets cut off (strangulated hernia). In an open repair, an incision is made in the abdomen to perform the surgery. Tell a health care provider about:  Any allergies you have.  All medicines you  are taking, including vitamins, herbs, eye drops, creams, and over-the-counter medicines.  Any problems you or family members have had with anesthetic medicines.  Any blood or bone disorders you have.  Any surgeries you have had.  Any medical conditions you have, including any recent cold or flu symptoms.  Whether you are pregnant or may be pregnant. What are the risks? Generally, this is a safe procedure. However, problems may occur, including:  Long-lasting (chronic) pain.  Bleeding.  Infection.  Damage to the testicle. This can cause shrinking or swelling.  Damage to the bladder, blood vessels, intestine, or nerves near the hernia.  Trouble passing urine.  Allergic reactions to medicines.  Return of the hernia.  What happens before the procedure? Staying hydrated Follow instructions from your health care provider about hydration, which may include:  Up to 2 hours before the procedure - you may continue to drink clear liquids, such as water, clear fruit juice, black coffee, and plain tea.  Eating and drinking restrictions Follow instructions from your health care provider about eating and drinking, which may include:  8 hours before the procedure - stop eating heavy meals or foods such as meat, fried foods, or fatty foods.  6 hours before the procedure - stop eating light meals or foods, such as toast or cereal.  6 hours before the procedure - stop drinking milk or drinks that  contain milk.  2 hours before the procedure - stop drinking clear liquids.  Medicines  Ask your health care provider about: ? Changing or stopping your regular medicines. This is especially important if you are taking diabetes medicines or blood thinners. ? Taking medicines such as aspirin and ibuprofen. These medicines can thin your blood. Do not take these medicines before your procedure if your health care provider instructs you not to.  You may be given antibiotic medicine to help  prevent infection. General instructions  You may have blood tests or imaging studies.  Ask your health care provider how your surgical site will be marked or identified.  If you smoke, do not smoke for at least 2 weeks before your procedure or for as long as told by your health care provider.  Let your health care provider know if you develop a cold or any infection before your surgery.  Plan to have someone take you home from the hospital or clinic.  If you will be going home right after the procedure, plan to have someone with you for 24 hours. What happens during the procedure?  To reduce your risk of infection: ? Your health care team will wash or sanitize their hands. ? Your skin will be washed with soap. ? Hair may be removed from the surgical area.  An IV tube will be inserted into one of your veins.  You will be given one or more of the following: ? A medicine to help you relax (sedative). ? A medicine to numb the area (local anesthetic). ? A medicine to make you fall asleep (general anesthetic).  Your surgeon will make an incision over the hernia.  The tissues of the hernia will be moved back into place.  The edges of the hernia may be stitched together.  The opening in the abdominal muscles will be closed with stitches (sutures). Or, your surgeon will place a mesh patch made of manmade (synthetic) material over the opening.  The incision will be closed.  A bandage (dressing) may be placed over the incision. The procedure may vary among health care providers and hospitals. What happens after the procedure?  Your blood pressure, heart rate, breathing rate, and blood oxygen level will be monitored until the medicines you were given have worn off.  You may be given medicine for pain.  Do not drive for 24 hours if you received a sedative. This information is not intended to replace advice given to you by your health care provider. Make sure you discuss any  questions you have with your health care provider. Document Released: 12/16/2000 Document Revised: 01/10/2016 Document Reviewed: 12/04/2015 Elsevier Interactive Patient Education  2018 [place], Adult, Care After These instructions give you information about caring for yourself after your procedure. Your doctor may also give you more specific instructions. If you have problems or questions, contact your doctor. Follow these instructions at home: Surgical cut (incision) care   Follow instructions from your doctor about how to take care of your surgical cut area. Make sure you: ? Wash your hands with soap and water before you change your bandage (dressing). If you cannot use soap and water, use hand sanitizer. ? Change your bandage as told by your doctor. ? Leave stitches (sutures), skin glue, or skin tape (adhesive) strips in place. They may need to stay in place for 2 weeks or longer. If tape strips get loose and curl up, you may trim the loose edges. Do  not remove tape strips completely unless your doctor says it is okay.  Check your surgical cut every day for signs of infection. Check for: ? More redness, swelling, or pain. ? More fluid or blood. ? Warmth. ? Pus or a bad smell. Activity  Do not drive or use heavy machinery while taking prescription pain medicine. Do not drive until your doctor says it is okay.  Until your doctor says it is okay: ? Do not lift anything that is heavier than 10 lb (4.5 kg). ? Do not play contact sports.  Return to your normal activities as told by your doctor. Ask your doctor what activities are safe. General instructions  To prevent or treat having a hard time pooping (constipation) while you are taking prescription pain medicine, your doctor may recommend that you: ? Drink enough fluid to keep your pee (urine) clear or pale yellow. ? Take over-the-counter or prescription medicines. ? Eat foods that are high in fiber, such  as fresh fruits and vegetables, whole grains, and beans. ? Limit foods that are high in fat and processed sugars, such as fried and sweet foods.  Take over-the-counter and prescription medicines only as told by your doctor.  Do not take baths, swim, or use a hot tub until your doctor says it is okay.  Keep all follow-up visits as told by your doctor. This is important. Contact a doctor if:  You develop a rash.  You have more redness, swelling, or pain around your surgical cut.  You have more fluid or blood coming from your surgical cut.  Your surgical cut feels warm to the touch.  You have pus or a bad smell coming from your surgical cut.  You have a fever or chills.  You have blood in your poop (stool).  You have not pooped in 2-3 days.  Medicine does not help your pain. Get help right away if:  You have chest pain or you are short of breath.  You feel light-headed.  You feel weak and dizzy (feel faint).  You have very bad pain.  You throw up (vomit) and your pain is worse. This information is not intended to replace advice given to you by your health care provider. Make sure you discuss any questions you have with your health care provider. Document Released: 07/13/2014 Document Revised: 01/10/2016 Document Reviewed: 12/04/2015 Elsevier Interactive Patient Education  2017 [place] Anesthesia, Adult General anesthesia is the use of medicines to make a person "go to sleep" (be unconscious) for a medical procedure. General anesthesia is often recommended when a procedure:  Is long.  Requires you to be still or in an unusual position.  Is major and can cause you to lose blood.  Is impossible to do without general anesthesia.  The medicines used for general anesthesia are called general anesthetics. In addition to making you sleep, the medicines:  Prevent pain.  Control your blood pressure.  Relax your muscles.  Tell a health care provider  about:  Any allergies you have.  All medicines you are taking, including vitamins, herbs, eye drops, creams, and over-the-counter medicines.  Any problems you or family members have had with anesthetic medicines.  Types of anesthetics you have had in the past.  Any bleeding disorders you have.  Any surgeries you have had.  Any medical conditions you have.  Any history of heart or lung conditions, such as heart failure, sleep apnea, or chronic obstructive pulmonary disease (COPD).  Whether you are pregnant  or may be pregnant.  Whether you use tobacco, alcohol, marijuana, or street drugs.  Any history of Armed forces logistics/support/administrative officer.  Any history of depression or anxiety. What are the risks? Generally, this is a safe procedure. However, problems may occur, including:  Allergic reaction to anesthetics.  Lung and heart problems.  Inhaling food or liquids from your stomach into your lungs (aspiration).  Injury to nerves.  Waking up during your procedure and being unable to move (rare).  Extreme agitation or a state of mental confusion (delirium) when you wake up from the anesthetic.  Air in the bloodstream, which can lead to stroke.  These problems are more likely to develop if you are having a major surgery or if you have an advanced medical condition. You can prevent some of these complications by answering all of your health care provider's questions thoroughly and by following all pre-procedure instructions. General anesthesia can cause side effects, including:  Nausea or vomiting  A sore throat from the breathing tube.  Feeling cold or shivery.  Feeling tired, washed out, or achy.  Sleepiness or drowsiness.  Confusion or agitation.  What happens before the procedure? Staying hydrated Follow instructions from your health care provider about hydration, which may include:  Up to 2 hours before the procedure - you may continue to drink clear liquids, such as water, clear  fruit juice, black coffee, and plain tea.  Eating and drinking restrictions Follow instructions from your health care provider about eating and drinking, which may include:  8 hours before the procedure - stop eating heavy meals or foods such as meat, fried foods, or fatty foods.  6 hours before the procedure - stop eating light meals or foods, such as toast or cereal.  6 hours before the procedure - stop drinking milk or drinks that contain milk.  2 hours before the procedure - stop drinking clear liquids.  Medicines  Ask your health care provider about: ? Changing or stopping your regular medicines. This is especially important if you are taking diabetes medicines or blood thinners. ? Taking medicines such as aspirin and ibuprofen. These medicines can thin your blood. Do not take these medicines before your procedure if your health care provider instructs you not to. ? Taking new dietary supplements or medicines. Do not take these during the week before your procedure unless your health care provider approves them.  If you are told to take a medicine or to continue taking a medicine on the day of the procedure, take the medicine with sips of water. General instructions   Ask if you will be going home the same day, the following day, or after a longer hospital stay. ? Plan to have someone take you home. ? Plan to have someone stay with you for the first 24 hours after you leave the hospital or clinic.  For 3-6 weeks before the procedure, try not to use any tobacco products, such as cigarettes, chewing tobacco, and e-cigarettes.  You may brush your teeth on the morning of the procedure, but make sure to spit out the toothpaste. What happens during the procedure?  You will be given anesthetics through a mask and through an IV tube in one of your veins.  You may receive medicine to help you relax (sedative).  As soon as you are asleep, a breathing tube may be used to help you  breathe.  An anesthesia specialist will stay with you throughout the procedure. He or she will help keep you comfortable and  safe by continuing to give you medicines and adjusting the amount of medicine that you get. He or she will also watch your blood pressure, pulse, and oxygen levels to make sure that the anesthetics do not cause any problems.  If a breathing tube was used to help you breathe, it will be removed before you wake up. The procedure may vary among health care providers and hospitals. What happens after the procedure?  You will wake up, often slowly, after the procedure is complete, usually in a recovery area.  Your blood pressure, heart rate, breathing rate, and blood oxygen level will be monitored until the medicines you were given have worn off.  You may be given medicine to help you calm down if you feel anxious or agitated.  If you will be going home the same day, your health care provider may check to make sure you can stand, drink, and urinate.  Your health care providers will treat your pain and side effects before you go home.  Do not drive for 24 hours if you received a sedative.  You may: ? Feel nauseous and vomit. ? Have a sore throat. ? Have mental slowness. ? Feel cold or shivery. ? Feel sleepy. ? Feel tired. ? Feel sore or achy, even in parts of your body where you did not have surgery. This information is not intended to replace advice given to you by your health care provider. Make sure you discuss any questions you have with your health care provider. Document Released: 09/29/2007 Document Revised: 12/03/2015 Document Reviewed: 06/06/2015 Elsevier Interactive Patient Education  2018 [place] Anesthesia, Adult, Care After These instructions provide you with information about caring for yourself after your procedure. Your health care provider may also give you more specific instructions. Your treatment has been planned according to current  medical practices, but problems sometimes occur. Call your health care provider if you have any problems or questions after your procedure. What can I expect after the procedure? After the procedure, it is common to have:  Vomiting.  A sore throat.  Mental slowness.  It is common to feel:  Nauseous.  Cold or shivery.  Sleepy.  Tired.  Sore or achy, even in parts of your body where you did not have surgery.  Follow these instructions at home: For at least 24 hours after the procedure:  Do not: ? Participate in activities where you could fall or become injured. ? Drive. ? Use heavy machinery. ? Drink alcohol. ? Take sleeping pills or medicines that cause drowsiness. ? Make important decisions or sign legal documents. ? Take care of children on your own.  Rest. Eating and drinking  If you vomit, drink water, juice, or soup when you can drink without vomiting.  Drink enough fluid to keep your urine clear or pale yellow.  Make sure you have little or no nausea before eating solid foods.  Follow the diet recommended by your health care provider. General instructions  Have a responsible adult stay with you until you are awake and alert.  Return to your normal activities as told by your health care provider. Ask your health care provider what activities are safe for you.  Take over-the-counter and prescription medicines only as told by your health care provider.  If you smoke, do not smoke without supervision.  Keep all follow-up visits as told by your health care provider. This is important. Contact a health care provider if:  You continue to have nausea or vomiting  at home, and medicines are not helpful.  You cannot drink fluids or start eating again.  You cannot urinate after 8-12 hours.  You develop a skin rash.  You have fever.  You have increasing redness at the site of your procedure. Get help right away if:  You have difficulty breathing.  You  have chest pain.  You have unexpected bleeding.  You feel that you are having a life-threatening or urgent problem. This information is not intended to replace advice given to you by your health care provider. Make sure you discuss any questions you have with your health care provider. Document Released: 09/28/2000 Document Revised: 11/25/2015 Document Reviewed: 06/06/2015 Elsevier Interactive Patient Education  [employer].

## 2017-03-31 ENCOUNTER — Encounter (HOSPITAL_COMMUNITY)
Admission: RE | Admit: 2017-03-31 | Discharge: 2017-03-31 | Disposition: A | Discharge status: Home / Self Care | Payer: Medicaid Other | Source: Ambulatory Visit | Attending: General Surgery | Admitting: General Surgery

## 2017-03-31 ENCOUNTER — Encounter (HOSPITAL_COMMUNITY): Payer: Self-pay

## 2017-03-31 DIAGNOSIS — K4091 Unilateral inguinal hernia, without obstruction or gangrene, recurrent: Secondary | ICD-10-CM | POA: Insufficient documentation

## 2017-03-31 DIAGNOSIS — Z01810 Encounter for preprocedural cardiovascular examination: Secondary | ICD-10-CM | POA: Diagnosis not present

## 2017-03-31 DIAGNOSIS — I4510 Unspecified right bundle-branch block: Secondary | ICD-10-CM | POA: Insufficient documentation

## 2017-03-31 DIAGNOSIS — Z01818 Encounter for other preprocedural examination: Secondary | ICD-10-CM | POA: Insufficient documentation

## 2017-03-31 HISTORY — DX: Sleep apnea, unspecified: G47.30

## 2017-03-31 LAB — CBC WITH DIFFERENTIAL/PLATELET
BASOS ABS: 0.1 10*3/uL (ref 0.0–0.1)
Basophils Relative: 1 %
Eosinophils Absolute: 0.1 10*3/uL (ref 0.0–0.7)
Eosinophils Relative: 1 %
HEMATOCRIT: 49 % (ref 39.0–52.0)
Hemoglobin: 17.4 g/dL — ABNORMAL HIGH (ref 13.0–17.0)
Lymphocytes Relative: 37 %
Lymphs Abs: 3.4 10*3/uL (ref 0.7–4.0)
MCH: 31.3 pg (ref 26.0–34.0)
MCHC: 35.5 g/dL (ref 30.0–36.0)
MCV: 88.1 fL (ref 78.0–100.0)
MONO ABS: 0.5 10*3/uL (ref 0.1–1.0)
MONOS PCT: 6 %
NEUTROS ABS: 4.9 10*3/uL (ref 1.7–7.7)
Neutrophils Relative %: 55 %
Platelets: 234 10*3/uL (ref 150–400)
RBC: 5.56 MIL/uL (ref 4.22–5.81)
RDW: 12.3 % (ref 11.5–15.5)
WBC: 9 10*3/uL (ref 4.0–10.5)

## 2017-03-31 LAB — BASIC METABOLIC PANEL
ANION GAP: 9 (ref 5–15)
BUN: 12 mg/dL (ref 6–20)
CO2: 25 mmol/L (ref 22–32)
Calcium: 9.1 mg/dL (ref 8.9–10.3)
Chloride: 105 mmol/L (ref 101–111)
Creatinine, Ser: 1.06 mg/dL (ref 0.61–1.24)
GFR calc non Af Amer: >60 mL/min (ref >60)
GLUCOSE: 94 mg/dL (ref 65–99)
POTASSIUM: 3.9 mmol/L (ref 3.5–5.1)
Sodium: 139 mmol/L (ref 135–145)

## 2017-04-01 NOTE — Pre-Procedure Instructions (Signed)
Potassium of 2.9 called to Dr Arnoldo Morale. He will start patient on potassium. Will do Istat on arrival 04/05/2017.

## 2017-04-05 ENCOUNTER — Ambulatory Visit (HOSPITAL_COMMUNITY)
Admission: RE | Admit: 2017-04-05 | Discharge: 2017-04-05 | Disposition: A | Discharge status: Home / Self Care | Payer: Medicaid Other | Source: Ambulatory Visit | Attending: General Surgery | Admitting: General Surgery

## 2017-04-05 ENCOUNTER — Ambulatory Visit (HOSPITAL_COMMUNITY): Payer: Medicaid Other | Admitting: Anesthesiology

## 2017-04-05 ENCOUNTER — Encounter (HOSPITAL_COMMUNITY)
Admission: RE | Disposition: A | Discharge status: Home / Self Care | Payer: Self-pay | Source: Ambulatory Visit | Attending: General Surgery

## 2017-04-05 ENCOUNTER — Encounter (HOSPITAL_COMMUNITY): Payer: Self-pay | Admitting: *Deleted

## 2017-04-05 DIAGNOSIS — F17210 Nicotine dependence, cigarettes, uncomplicated: Secondary | ICD-10-CM | POA: Diagnosis not present

## 2017-04-05 DIAGNOSIS — Z79899 Other long term (current) drug therapy: Secondary | ICD-10-CM | POA: Diagnosis not present

## 2017-04-05 DIAGNOSIS — E785 Hyperlipidemia, unspecified: Secondary | ICD-10-CM | POA: Insufficient documentation

## 2017-04-05 DIAGNOSIS — F319 Bipolar disorder, unspecified: Secondary | ICD-10-CM | POA: Insufficient documentation

## 2017-04-05 DIAGNOSIS — E039 Hypothyroidism, unspecified: Secondary | ICD-10-CM | POA: Insufficient documentation

## 2017-04-05 DIAGNOSIS — J449 Chronic obstructive pulmonary disease, unspecified: Secondary | ICD-10-CM | POA: Diagnosis not present

## 2017-04-05 DIAGNOSIS — F259 Schizoaffective disorder, unspecified: Secondary | ICD-10-CM | POA: Diagnosis not present

## 2017-04-05 DIAGNOSIS — I10 Essential (primary) hypertension: Secondary | ICD-10-CM | POA: Diagnosis not present

## 2017-04-05 DIAGNOSIS — K4091 Unilateral inguinal hernia, without obstruction or gangrene, recurrent: Secondary | ICD-10-CM

## 2017-04-05 HISTORY — PX: INGUINAL HERNIA REPAIR: SHX194

## 2017-04-05 LAB — POCT I-STAT 4, (NA,K, GLUC, HGB,HCT)
Glucose, Bld: 95 mg/dL (ref 65–99)
HCT: 51 % (ref 39.0–52.0)
HEMOGLOBIN: 17.3 g/dL — AB (ref 13.0–17.0)
POTASSIUM: 4.6 mmol/L (ref 3.5–5.1)
SODIUM: 139 mmol/L (ref 135–145)

## 2017-04-05 SURGERY — REPAIR, HERNIA, INGUINAL, ADULT
Anesthesia: General | Site: Inguinal | Laterality: Left

## 2017-04-05 MED ORDER — ONDANSETRON HCL 4 MG/2ML IJ SOLN
4.0000 mg | Freq: Once | INTRAMUSCULAR | Status: AC
  Administered 2017-04-05: 4 mg via INTRAVENOUS

## 2017-04-05 MED ORDER — MIDAZOLAM HCL 2 MG/2ML IJ SOLN
1.0000 mg | INTRAMUSCULAR | Status: AC
  Administered 2017-04-05: 2 mg via INTRAVENOUS

## 2017-04-05 MED ORDER — LIDOCAINE HCL (PF) 1 % IJ SOLN
INTRAMUSCULAR | Status: DC | PRN
  Administered 2017-04-05: 40 mg

## 2017-04-05 MED ORDER — ONDANSETRON HCL 4 MG/2ML IJ SOLN
INTRAMUSCULAR | Status: AC
  Filled 2017-04-05: qty 2

## 2017-04-05 MED ORDER — GLYCOPYRROLATE 0.2 MG/ML IJ SOLN
0.2000 mg | Freq: Once | INTRAMUSCULAR | Status: AC
  Administered 2017-04-05: 0.2 mg via INTRAVENOUS

## 2017-04-05 MED ORDER — BUPIVACAINE LIPOSOME 1.3 % IJ SUSP
INTRAMUSCULAR | Status: DC | PRN
  Administered 2017-04-05: 20 mL

## 2017-04-05 MED ORDER — HYDROMORPHONE HCL 1 MG/ML IJ SOLN
0.5000 mg | INTRAMUSCULAR | Status: DC | PRN
  Administered 2017-04-05: 1 mg via INTRAVENOUS

## 2017-04-05 MED ORDER — DEXAMETHASONE SODIUM PHOSPHATE 4 MG/ML IJ SOLN
INTRAMUSCULAR | Status: AC
Start: 2017-04-05 — End: 2017-04-05
  Filled 2017-04-05: qty 1

## 2017-04-05 MED ORDER — CEFAZOLIN SODIUM-DEXTROSE 2-4 GM/100ML-% IV SOLN
2.0000 g | INTRAVENOUS | Status: AC
  Administered 2017-04-05: 2 g via INTRAVENOUS
  Filled 2017-04-05: qty 100

## 2017-04-05 MED ORDER — PHENYLEPHRINE HCL 10 MG/ML IJ SOLN
INTRAMUSCULAR | Status: DC | PRN
  Administered 2017-04-05 (×2): 100 ug via INTRAVENOUS
  Administered 2017-04-05: 80 ug via INTRAVENOUS

## 2017-04-05 MED ORDER — CHLORHEXIDINE GLUCONATE CLOTH 2 % EX PADS: 6.0000 | MEDICATED_PAD | Freq: Once | CUTANEOUS | Status: DC

## 2017-04-05 MED ORDER — LACTATED RINGERS IV SOLN
INTRAVENOUS | Status: DC
  Administered 2017-04-05 (×2): via INTRAVENOUS

## 2017-04-05 MED ORDER — KETOROLAC TROMETHAMINE 30 MG/ML IJ SOLN
30.0000 mg | Freq: Once | INTRAMUSCULAR | Status: AC
  Administered 2017-04-05: 30 mg via INTRAVENOUS

## 2017-04-05 MED ORDER — MIDAZOLAM HCL 2 MG/2ML IJ SOLN
INTRAMUSCULAR | Status: AC
  Filled 2017-04-05: qty 2

## 2017-04-05 MED ORDER — OXYCODONE-ACETAMINOPHEN 7.5-325 MG PO TABS: 1.0000 | ORAL_TABLET | Freq: Four times a day (QID) | ORAL | 0 refills | Status: DC | PRN

## 2017-04-05 MED ORDER — GLYCOPYRROLATE 0.2 MG/ML IJ SOLN
INTRAMUSCULAR | Status: AC
  Filled 2017-04-05: qty 1

## 2017-04-05 MED ORDER — DEXAMETHASONE SODIUM PHOSPHATE 4 MG/ML IJ SOLN
4.0000 mg | Freq: Once | INTRAMUSCULAR | Status: AC
  Administered 2017-04-05: 4 mg via INTRAVENOUS

## 2017-04-05 MED ORDER — KETOROLAC TROMETHAMINE 30 MG/ML IJ SOLN
INTRAMUSCULAR | Status: AC
  Filled 2017-04-05: qty 1

## 2017-04-05 MED ORDER — FENTANYL CITRATE (PF) 100 MCG/2ML IJ SOLN
25.0000 ug | INTRAMUSCULAR | Status: DC | PRN
  Administered 2017-04-05 (×2): 50 ug via INTRAVENOUS
  Filled 2017-04-05: qty 2

## 2017-04-05 MED ORDER — PROPOFOL 10 MG/ML IV BOLUS
INTRAVENOUS | Status: AC
  Filled 2017-04-05: qty 20

## 2017-04-05 MED ORDER — IPRATROPIUM-ALBUTEROL 0.5-2.5 (3) MG/3ML IN SOLN
RESPIRATORY_TRACT | Status: AC
  Filled 2017-04-05: qty 3

## 2017-04-05 MED ORDER — FENTANYL CITRATE (PF) 250 MCG/5ML IJ SOLN
INTRAMUSCULAR | Status: AC
  Filled 2017-04-05: qty 5

## 2017-04-05 MED ORDER — LIDOCAINE HCL (PF) 1 % IJ SOLN
INTRAMUSCULAR | Status: AC
  Filled 2017-04-05: qty 5

## 2017-04-05 MED ORDER — HYDROMORPHONE HCL 1 MG/ML IJ SOLN
INTRAMUSCULAR | Status: AC
  Filled 2017-04-05: qty 1

## 2017-04-05 MED ORDER — EPHEDRINE SULFATE 50 MG/ML IJ SOLN
INTRAMUSCULAR | Status: AC
  Filled 2017-04-05: qty 1

## 2017-04-05 MED ORDER — SODIUM CHLORIDE 0.9 % IR SOLN
Status: DC | PRN
  Administered 2017-04-05: 1000 mL

## 2017-04-05 MED ORDER — FENTANYL CITRATE (PF) 100 MCG/2ML IJ SOLN
INTRAMUSCULAR | Status: DC | PRN
  Administered 2017-04-05: 100 ug via INTRAVENOUS
  Administered 2017-04-05: 50 ug via INTRAVENOUS
  Administered 2017-04-05: 25 ug via INTRAVENOUS

## 2017-04-05 MED ORDER — PROPOFOL 10 MG/ML IV BOLUS
INTRAVENOUS | Status: DC | PRN
  Administered 2017-04-05: 200 mg via INTRAVENOUS

## 2017-04-05 MED ORDER — PHENYLEPHRINE 40 MCG/ML (10ML) SYRINGE FOR IV PUSH (FOR BLOOD PRESSURE SUPPORT)
PREFILLED_SYRINGE | INTRAVENOUS | Status: AC
  Filled 2017-04-05: qty 10

## 2017-04-05 MED ORDER — EPHEDRINE SULFATE 50 MG/ML IJ SOLN
INTRAMUSCULAR | Status: DC | PRN
  Administered 2017-04-05: 10 mg via INTRAVENOUS
  Administered 2017-04-05: 5 mg via INTRAVENOUS

## 2017-04-05 MED ORDER — IPRATROPIUM-ALBUTEROL 0.5-2.5 (3) MG/3ML IN SOLN
3.0000 mL | Freq: Once | RESPIRATORY_TRACT | Status: AC
  Administered 2017-04-05: 3 mL via RESPIRATORY_TRACT

## 2017-04-05 MED ORDER — BUPIVACAINE LIPOSOME 1.3 % IJ SUSP
INTRAMUSCULAR | Status: AC
  Filled 2017-04-05: qty 20

## 2017-04-05 SURGICAL SUPPLY — 38 items
BAG HAMPER (MISCELLANEOUS) ×2 IMPLANT
CLOTH BEACON ORANGE TIMEOUT ST (SAFETY) ×2 IMPLANT
COVER LIGHT HANDLE STERIS (MISCELLANEOUS) ×4 IMPLANT
DERMABOND ADVANCED (GAUZE/BANDAGES/DRESSINGS) ×1
DERMABOND ADVANCED .7 DNX12 (GAUZE/BANDAGES/DRESSINGS) ×1 IMPLANT
DRAIN PENROSE 18X1/2 LTX STRL (DRAIN) ×2 IMPLANT
ELECT REM PT RETURN 9FT ADLT (ELECTROSURGICAL) ×2
ELECTRODE REM PT RTRN 9FT ADLT (ELECTROSURGICAL) ×1 IMPLANT
GAUZE SPONGE 4X4 16PLY XRAY LF (GAUZE/BANDAGES/DRESSINGS) ×2 IMPLANT
GLOVE BIO SURGEON STRL SZ7 (GLOVE) ×4 IMPLANT
GLOVE BIOGEL PI IND STRL 6.5 (GLOVE) ×1 IMPLANT
GLOVE BIOGEL PI IND STRL 7.0 (GLOVE) ×3 IMPLANT
GLOVE BIOGEL PI INDICATOR 6.5 (GLOVE) ×1
GLOVE BIOGEL PI INDICATOR 7.0 (GLOVE) ×3
GLOVE SURG SS PI 7.5 STRL IVOR (GLOVE) ×2 IMPLANT
GOWN STRL REUS W/ TWL XL LVL3 (GOWN DISPOSABLE) ×1 IMPLANT
GOWN STRL REUS W/TWL LRG LVL3 (GOWN DISPOSABLE) ×4 IMPLANT
GOWN STRL REUS W/TWL XL LVL3 (GOWN DISPOSABLE) ×1
INST SET MINOR GENERAL (KITS) ×2 IMPLANT
KIT ROOM TURNOVER APOR (KITS) ×2 IMPLANT
MANIFOLD NEPTUNE II (INSTRUMENTS) ×2 IMPLANT
MESH MARLEX PLUG MEDIUM (Mesh General) ×2 IMPLANT
NEEDLE HYPO 21X1.5 SAFETY (NEEDLE) ×2 IMPLANT
NS IRRIG 1000ML POUR BTL (IV SOLUTION) ×2 IMPLANT
PACK MINOR (CUSTOM PROCEDURE TRAY) ×2 IMPLANT
PAD ARMBOARD 7.5X6 YLW CONV (MISCELLANEOUS) ×2 IMPLANT
SET BASIN LINEN APH (SET/KITS/TRAYS/PACK) ×2 IMPLANT
SUT NOVA NAB GS-22 2 2-0 T-19 (SUTURE) ×4 IMPLANT
SUT PROLENE 2 0 SH 30 (SUTURE) IMPLANT
SUT SILK 3 0 (SUTURE)
SUT SILK 3-0 18XBRD TIE 12 (SUTURE) IMPLANT
SUT VIC AB 2-0 CT1 27 (SUTURE) ×1
SUT VIC AB 2-0 CT1 TAPERPNT 27 (SUTURE) ×1 IMPLANT
SUT VIC AB 3-0 SH 27 (SUTURE) ×1
SUT VIC AB 3-0 SH 27X BRD (SUTURE) ×1 IMPLANT
SUT VIC AB 4-0 PS2 27 (SUTURE) ×2 IMPLANT
SUT VICRYL AB 3 0 TIES (SUTURE) ×2 IMPLANT
SYR 20CC LL (SYRINGE) ×2 IMPLANT

## 2017-04-05 NOTE — Op Note (Signed)
Patient:  Steve Romero  DOB:  1971/04/16  MRN:  [id number]   Preop Diagnosis:  Recurrent left inguinal hernia  Postop Diagnosis:  Same  Procedure:  Recurrent left inguinal herniorrhaphy with mesh  Surgeon:  [name], M.D.  Anes:  Gen.  Indications:  Patient is a 46 year old white male who presents with a recurrent left inguinal hernia which had been repaired in the remote past at another facility. The risks and benefits of the procedure including bleeding, infection, recurrence of the hernia, mesh use, and the possibility of testicular swelling or atrophy were fully explained to the patient, who gave informed consent.  Procedure note:  The patient was placed in supine position. After general anesthesia was monitored, the left groin region was prepped and draped using the usual sterile technique with DuraPrep. Surgical site confirmation was performed.  An incision was made through the previous surgical scar. This was taken down to the external oblique aponeuroses. The aponeuroses was incised to the external ring. A Penrose drain was placed around spermatic cord. The vase deference was no within the spermatic cord. The patient was noted to have an indirect hernia with a small narrow base. The properitoneal fat layer as well as the hernia sac was freed away from the spermatic cord up to the peritoneal reflection and was ligated using a 3-0 Vicryl tie. 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 An onlay patch was then placed along the floor of the ultrasound secured superiorly to the conjoined tendon and inferiorly to the shelving edge of Poupart's ligament using 2-0 Novafil interrupted sutures. The internal ring was re-created using a 2-0 Novafil interrupted suture. The external ligated aponeuroses was reapproximated using a 2-0 Vicryl running suture. Subcutaneous layer was reapproximated using a 3-0 Vicryl interrupted suture. The skin was closed  using a 4-0 Vicryl subcuticular suture. Exparel was instilled into the surrounding wound. Dermabond was applied.  All tape and needle counts were correct at the end of the procedure. The patient was awakened and transferred to PACU in stable condition.  Complications:  None  EBL:  Minimal  Specimen:  None

## 2017-04-05 NOTE — Anesthesia Postprocedure Evaluation (Signed)
Anesthesia Post Note  Patient: Steve Romero  Procedure(s) Performed: RECURRENT HERNIA REPAIR INGUINAL ADULT WITH MESH (Left Inguinal)  Patient location during evaluation: PACU Anesthesia Type: General Level of consciousness: awake and alert and oriented Pain management: pain level controlled Vital Signs Assessment: post-procedure vital signs reviewed and stable Respiratory status: spontaneous breathing Cardiovascular status: blood pressure returned to baseline and stable Postop Assessment: no apparent nausea or vomiting Anesthetic complications: no Comments: Late entry     Last Vitals:  Vitals:   04/05/17 0945 04/05/17 0958  BP:  119/82  Pulse:  76  Resp:  12  Temp:  36.4 C  SpO2: 100% 96%    Last Pain:  Vitals:   04/05/17 0958  TempSrc: Oral  PainSc:                  Tressie Stalker

## 2017-04-05 NOTE — Anesthesia Procedure Notes (Signed)
Procedure Name: LMA Insertion Date/Time: 04/05/2017 7:30 AM Performed by: Raenette Rover Pre-anesthesia Checklist: Patient identified, Emergency Drugs available, Suction available and Patient being monitored Patient Re-evaluated:Patient Re-evaluated prior to induction Oxygen Delivery Method: Circle system utilized Preoxygenation: Pre-oxygenation with 100% oxygen Induction Type: IV induction LMA: LMA inserted LMA Size: 5.0 Number of attempts: 1 Placement Confirmation: positive ETCO2,  CO2 detector and breath sounds checked- equal and bilateral Tube secured with: Tape Dental Injury: Teeth and Oropharynx as per pre-operative assessment

## 2017-04-05 NOTE — Interval H&P Note (Signed)
History and Physical Interval Note:  04/05/2017 7:14 AM  Steve Romero  has presented today for surgery, with the diagnosis of recurrent left inguinal hernia  The various methods of treatment have been discussed with the patient and family. After consideration of risks, benefits and other options for treatment, the patient has consented to  Procedure(s): RECURRENT HERNIA REPAIR INGUINAL ADULT WITH MESH (Left) as a surgical intervention .  The patient's history has been reviewed, patient examined, no change in status, stable for surgery.  I have reviewed the patient's chart and labs.  Questions were answered to the patient's satisfaction.     [name]

## 2017-04-05 NOTE — Discharge Instructions (Signed)
Open Hernia Repair, Adult, Care After °This sheet gives you information about how to care for yourself after your procedure. Your health care provider may also give you more specific instructions. If you have problems or questions, contact your health care provider. °What can I expect after the procedure? °After the procedure, it is common to have: °· Mild discomfort. °· Slight bruising. °· Minor swelling. °· Pain in the abdomen. ° °Follow these instructions at home: °Incision care ° °· Follow instructions from your health care provider about how to take care of your incision area. Make sure you: °? Wash your hands with soap and water before you change your bandage (dressing). If soap and water are not available, use hand sanitizer. °? Change your dressing as told by your health care provider. °? Leave stitches (sutures), skin glue, or adhesive strips in place. These skin closures may need to stay in place for 2 weeks or longer. If adhesive strip edges start to loosen and curl up, you may trim the loose edges. Do not remove adhesive strips completely unless your health care provider tells you to do that. °· Check your incision area every day for signs of infection. Check for: °? More redness, swelling, or pain. °? More fluid or blood. °? Warmth. °? Pus or a bad smell. °Activity °· Do not drive or use heavy machinery while taking prescription pain medicine. Do not drive until your health care provider approves. °· Until your health care provider approves: °? Do not lift anything that is heavier than 10 lb (4.5 kg). °? Do not play contact sports. °· Return to your normal activities as told by your health care provider. Ask your health care provider what activities are safe. °General instructions °· To prevent or treat constipation while you are taking prescription pain medicine, your health care provider may recommend that you: °? Drink enough fluid to keep your urine clear or pale yellow. °? Take over-the-counter or  prescription medicines. °? Eat foods that are high in fiber, such as fresh fruits and vegetables, whole grains, and beans. °? Limit foods that are high in fat and processed sugars, such as fried and sweet foods. °· Take over-the-counter and prescription medicines only as told by your health care provider. °· Do not take tub baths or go swimming until your health care provider approves. °· Keep all follow-up visits as told by your health care provider. This is important. °Contact a health care provider if: °· You develop a rash. °· You have more redness, swelling, or pain around your incision. °· You have more fluid or blood coming from your incision. °· Your incision feels warm to the touch. °· You have pus or a bad smell coming from your incision. °· You have a fever or chills. °· You have blood in your stool (feces). °· You have not had a bowel movement in 2-3 days. °· Your pain is not controlled with medicine. °Get help right away if: °· You have chest pain or shortness of breath. °· You feel light-headed or feel faint. °· You have severe pain. °· You vomit and your pain is worse. °This information is not intended to replace advice given to you by your health care provider. Make sure you discuss any questions you have with your health care provider. °Document Released: 01/09/2005 Document Revised: 01/10/2016 Document Reviewed: 12/04/2015 °Elsevier Interactive Patient Education © 2017 [employer] ° °PATIENT INSTRUCTIONS °POST-ANESTHESIA ° °IMMEDIATELY FOLLOWING SURGERY:  Do not drive or operate machinery for the   first twenty four hours after surgery.  Do not make any important decisions for twenty four hours after surgery or while taking narcotic pain medications or sedatives.  If you develop intractable nausea and vomiting or a severe headache please notify your doctor immediately. ° °FOLLOW-UP:  Please make an appointment with your surgeon as instructed. You do not need to follow up with anesthesia unless  specifically instructed to do so. ° °WOUND CARE INSTRUCTIONS (if applicable):  Keep a dry clean dressing on the anesthesia/puncture wound site if there is drainage.  Once the wound has quit draining you may leave it open to air.  Generally you should leave the bandage intact for twenty four hours unless there is drainage.  If the epidural site drains for more than 36-48 hours please call the anesthesia department. ° °QUESTIONS?:  Please feel free to call your physician or the hospital operator if you have any questions, and they will be happy to assist you.    ° ° ° ° °

## 2017-04-05 NOTE — Transfer of Care (Signed)
Immediate Anesthesia Transfer of Care Note  Patient: Steve Romero  Procedure(s) Performed: RECURRENT HERNIA REPAIR INGUINAL ADULT WITH MESH (Left Inguinal)  Patient Location: PACU  Anesthesia Type:General  Level of Consciousness: awake, alert , oriented and patient cooperative  Airway & Oxygen Therapy: Patient Spontanous Breathing and Patient connected to nasal cannula oxygen  Post-op Assessment: Report given to RN and Post -op Vital signs reviewed and stable  Post vital signs: Reviewed and stable  Last Vitals:  Vitals:   04/05/17 0636 04/05/17 0715  BP:  113/77  Resp:  (!) 39  Temp: 36.7 C   SpO2:  97%    Last Pain:  Vitals:   04/05/17 0636  TempSrc: Oral  PainSc: 5       Patients Stated Pain Goal: 7 (14/97/02 6378)  Complications: No apparent anesthesia complications

## 2017-04-05 NOTE — Anesthesia Preprocedure Evaluation (Signed)
Anesthesia Evaluation  Patient identified by MRN, date of birth, ID band Patient awake    Reviewed: Allergy & Precautions, NPO status , Patient's Chart, lab work & pertinent test results  Airway Mallampati: I  TM Distance: >3 FB Neck ROM: Full    Dental  (+) Edentulous Upper, Edentulous Lower   Pulmonary sleep apnea , COPD, Current Smoker,    breath sounds clear to auscultation       Cardiovascular hypertension, Pt. on medications negative cardio ROS   Rhythm:Regular Rate:Normal     Neuro/Psych PSYCHIATRIC DISORDERS ( Schizoaffective disorder ) Bipolar Disorder    GI/Hepatic negative GI ROS,   Endo/Other  Hypothyroidism   Renal/GU      Musculoskeletal   Abdominal   Peds  Hematology   Anesthesia Other Findings   Reproductive/Obstetrics                             Anesthesia Physical Anesthesia Plan  ASA: III  Anesthesia Plan: General   Post-op Pain Management:    Induction: Intravenous  PONV Risk Score and Plan:   Airway Management Planned: LMA  Additional Equipment:   Intra-op Plan:   Post-operative Plan: Extubation in OR  Informed Consent: I have reviewed the patients History and Physical, chart, labs and discussed the procedure including the risks, benefits and alternatives for the proposed anesthesia with the patient or authorized representative who has indicated his/her understanding and acceptance.     Plan Discussed with:   Anesthesia Plan Comments:         Anesthesia Quick Evaluation

## 2017-04-06 ENCOUNTER — Encounter (HOSPITAL_COMMUNITY): Payer: Self-pay | Admitting: General Surgery

## 2017-04-08 ENCOUNTER — Emergency Department (HOSPITAL_COMMUNITY)
Admission: EM | Admit: 2017-04-08 | Discharge: 2017-04-08 | Disposition: A | Discharge status: Home / Self Care | Payer: Medicaid Other | Attending: Emergency Medicine | Admitting: Emergency Medicine

## 2017-04-08 ENCOUNTER — Encounter (HOSPITAL_COMMUNITY): Payer: Self-pay | Admitting: Emergency Medicine

## 2017-04-08 DIAGNOSIS — R2243 Localized swelling, mass and lump, lower limb, bilateral: Secondary | ICD-10-CM | POA: Diagnosis present

## 2017-04-08 LAB — URINALYSIS, ROUTINE W REFLEX MICROSCOPIC
BILIRUBIN URINE: NEGATIVE
Glucose, UA: NEGATIVE mg/dL
HGB URINE DIPSTICK: NEGATIVE
Ketones, ur: NEGATIVE mg/dL
Leukocytes, UA: NEGATIVE
Nitrite: NEGATIVE
PROTEIN: NEGATIVE mg/dL
SPECIFIC GRAVITY, URINE: 1.019 (ref 1.005–1.030)
pH: 6 (ref 5.0–8.0)

## 2017-04-08 LAB — BASIC METABOLIC PANEL
Anion gap: 11 (ref 5–15)
BUN: 10 mg/dL (ref 6–20)
CALCIUM: 8.9 mg/dL (ref 8.9–10.3)
CO2: 29 mmol/L (ref 22–32)
CREATININE: 0.98 mg/dL (ref 0.61–1.24)
Chloride: 96 mmol/L — ABNORMAL LOW (ref 101–111)
GFR calc Af Amer: >60 mL/min (ref >60)
Glucose, Bld: 104 mg/dL — ABNORMAL HIGH (ref 65–99)
POTASSIUM: 4.4 mmol/L (ref 3.5–5.1)
SODIUM: 136 mmol/L (ref 135–145)

## 2017-04-08 LAB — CBC WITH DIFFERENTIAL/PLATELET
Basophils Absolute: 0 10*3/uL (ref 0.0–0.1)
Basophils Relative: 0 %
EOS ABS: 0.1 10*3/uL (ref 0.0–0.7)
EOS PCT: 1 %
HCT: 46.3 % (ref 39.0–52.0)
Hemoglobin: 16.1 g/dL (ref 13.0–17.0)
LYMPHS ABS: 2.4 10*3/uL (ref 0.7–4.0)
Lymphocytes Relative: 23 %
MCH: 31.5 pg (ref 26.0–34.0)
MCHC: 34.8 g/dL (ref 30.0–36.0)
MCV: 90.6 fL (ref 78.0–100.0)
Monocytes Absolute: 0.8 10*3/uL (ref 0.1–1.0)
Monocytes Relative: 8 %
Neutro Abs: 7 10*3/uL (ref 1.7–7.7)
Neutrophils Relative %: 68 %
PLATELETS: 216 10*3/uL (ref 150–400)
RBC: 5.11 MIL/uL (ref 4.22–5.81)
RDW: 12.4 % (ref 11.5–15.5)
WBC: 10.4 10*3/uL (ref 4.0–10.5)

## 2017-04-08 NOTE — ED Notes (Signed)
Called no answer

## 2017-04-08 NOTE — ED Triage Notes (Signed)
Pt states he had left inguinal hernia surgery on Monday. Pt c/o swelling to bilateral testicles and penis. Pt states he is able to urinate. Denies BM since Monday. nad noted.

## 2017-04-10 ENCOUNTER — Encounter (HOSPITAL_COMMUNITY): Payer: Self-pay | Admitting: Emergency Medicine

## 2017-04-10 ENCOUNTER — Emergency Department (HOSPITAL_COMMUNITY)
Admission: EM | Admit: 2017-04-10 | Discharge: 2017-04-10 | Disposition: A | Discharge status: Home / Self Care | Payer: Medicaid Other | Attending: Emergency Medicine | Admitting: Emergency Medicine

## 2017-04-10 DIAGNOSIS — N5089 Other specified disorders of the male genital organs: Secondary | ICD-10-CM | POA: Insufficient documentation

## 2017-04-10 DIAGNOSIS — Z7982 Long term (current) use of aspirin: Secondary | ICD-10-CM | POA: Diagnosis not present

## 2017-04-10 DIAGNOSIS — Z9889 Other specified postprocedural states: Secondary | ICD-10-CM | POA: Insufficient documentation

## 2017-04-10 DIAGNOSIS — J449 Chronic obstructive pulmonary disease, unspecified: Secondary | ICD-10-CM | POA: Diagnosis not present

## 2017-04-10 DIAGNOSIS — Z79899 Other long term (current) drug therapy: Secondary | ICD-10-CM | POA: Insufficient documentation

## 2017-04-10 DIAGNOSIS — I10 Essential (primary) hypertension: Secondary | ICD-10-CM | POA: Insufficient documentation

## 2017-04-10 DIAGNOSIS — F17210 Nicotine dependence, cigarettes, uncomplicated: Secondary | ICD-10-CM | POA: Insufficient documentation

## 2017-04-10 NOTE — ED Provider Notes (Signed)
[name]   CSN: [id number] Arrival date & time: 04/10/17  1611   History   Chief Complaint Chief Complaint  Patient presents with  . Groin Swelling    post surgical    HPI Steve Romero is a 46 y.o. male.  HPI  The pt had an open inguinal hernia repair that was done on 10/1 - by [name] - states that he has had increased swellign that was gradual in onset, constant and gradually worsening - he has been able to urinate without any difficulty and states that he has no pain in the scrotum.  He has no fevers and no abd pain, no n/v/d and no chills.  Sx are persistent.  He has not spoken with his surgeon up to this point - called office yesterday.  Past Medical History:  Diagnosis Date  . Anxiety 10-2014  . Bipolar disorder ([place]) age 33  . COPD (chronic obstructive pulmonary disease) ([place]) 2016  . Depression age 41  . Enlarged prostate   . Hyperlipidemia 2016  . Hypertension 2013  . Schizoaffective disorder ([place]) 11-13-14  . Sleep apnea   . Thyroid disease 11-2014    Patient Active Problem List   Diagnosis Date Noted  . Unilateral recurrent inguinal hernia without obstruction or gangrene   . Carpal tunnel syndrome, left   . Obesity, unspecified 09/01/2015  . HTN (hypertension) 04/22/2015  . Hyperlipidemia 04/22/2015  . Schizoaffective disorder ([place]) 04/22/2015  . Anxiety state 04/22/2015  . Cigarette nicotine dependence without complication 38/46/6599  . Edema 04/22/2015  . Lymphadenopathy 04/22/2015    Past Surgical History:  Procedure Laterality Date  . CARPAL TUNNEL RELEASE Left 02/21/2016   Procedure: CARPAL TUNNEL RELEASE;  Surgeon: Carole Civil, MD;  Location: [facility];  Service: Orthopedics;  Laterality: Left;  . HERNIA REPAIR Left 2002   groin  . INGUINAL HERNIA REPAIR Left 04/05/2017   Procedure: RECURRENT HERNIA REPAIR INGUINAL ADULT WITH MESH;  Surgeon: [name], MD;  Location: [facility];  Service: General;  Laterality:  Left;       Home Medications    Prior to Admission medications   Medication Sig Start Date End Date Taking? Authorizing Provider  amLODipine (NORVASC) 5 MG tablet Take 5 mg by mouth daily.    [provider]  aspirin 325 MG tablet Take 650 mg by mouth daily as needed for mild pain or moderate pain.     [provider]  cephALEXin (KEFLEX) 500 MG capsule Take 500 mg by mouth 2 (two) times daily. 03/29/17 04/26/17  [provider]  lurasidone (LATUDA) 80 MG TABS tablet Take 80 mg by mouth at bedtime.     [provider]  oxyCODONE-acetaminophen (PERCOCET) 7.5-325 MG tablet Take 1-2 tablets by mouth every 6 (six) hours as needed. 04/05/17   [name], MD  propranolol (INDERAL) 10 MG tablet Take 5 mg by mouth 2 (two) times daily.    [provider]    Family History Family History  Problem Relation Age of Onset  . Diabetes Father   . Heart disease Father   . Heart disease Maternal Grandmother   . Stroke Maternal Grandfather     Social History Social History  Substance Use Topics  . Smoking status: Current Every Day Smoker    Packs/day: 0.25    Years: 32.00    Types: Cigarettes  . Smokeless tobacco: Never Used  . Alcohol use No     Allergies   Desipramine  Review of Systems Review of Systems  All other systems reviewed and are negative.    Physical Exam Updated Vital Signs BP (!) 173/118 (BP Location: Right Arm)   Pulse 97   Temp 98.2 F (36.8 C) (Oral)   Resp 18   Ht 5\' 10"  (1.778 m)   Wt 113.9 kg (251 lb)   SpO2 100%   BMI 36.01 kg/m   Physical Exam  Constitutional: He appears well-developed and well-nourished. No distress.  HENT:  Head: Normocephalic and atraumatic.  Mouth/Throat: Oropharynx is clear and moist. No oropharyngeal exudate.  Eyes: Pupils are equal, round, and reactive to light. Conjunctivae and EOM are normal. Right eye exhibits no discharge. Left eye exhibits no discharge. No scleral  icterus.  Neck: Normal range of motion. Neck supple. No JVD present. No thyromegaly present.  Cardiovascular: Normal rate, regular rhythm, normal heart sounds and intact distal pulses.  Exam reveals no gallop and no friction rub.   No murmur heard. Pulmonary/Chest: Effort normal and breath sounds normal. No respiratory distress. He has no wheezes. He has no rales.  Abdominal: Soft. Bowel sounds are normal. He exhibits no distension and no mass. There is no tenderness.  The abdomen is soft and nontender  Genitourinary:  Genitourinary Comments: Genitourinary exam reveals a symmetrical but swollen and tense scrotum without any warmth, no tenderness, testicles are not palpable due to significant swelling which is approximately the size of a grapefruit. Penis is visible, urethral meatus appears normal.  Inguinal incision site is intact without redness or tenderness  Musculoskeletal: Normal range of motion. He exhibits no edema or tenderness.  Lymphadenopathy:    He has no cervical adenopathy.  Neurological: He is alert. Coordination normal.  Skin: Skin is warm and dry. No rash noted. No erythema.  Psychiatric: He has a normal mood and affect. His behavior is normal.  Nursing note and vitals reviewed.    ED Treatments / Results  Labs (all labs ordered are listed, but only abnormal results are displayed) Labs Reviewed - No data to display   Radiology No results found.  Procedures Procedures (including critical care time)  Medications Ordered in ED Medications - No data to display   Initial Impression / Assessment and Plan / ED Course  I have reviewed the triage vital signs and the nursing notes.  Pertinent labs & imaging results that were available during my care of the patient were reviewed by me and considered in my medical decision making (see chart for details).     I discussed the care with [name] who is on-call for [name] and recommends that as long as the patient  is not having any pain he can follow-up on Monday with general surgery. Ice, elevation, support of the scrotum, patient is in agreement.  Final Clinical Impressions(s) / ED Diagnoses   Final diagnoses:  Scrotal swelling    New Prescriptions New Prescriptions   No medications on file     Noemi Chapel, MD 04/10/17 1715

## 2017-04-10 NOTE — Discharge Instructions (Signed)
Please keep your scrotum elevated, ice, good support with tight underwear, follow-up with your general surgeon early this week. I have spoken with [name], the surgeon on call who recommends this treatment plan with close follow-up however if he should develop vomiting fevers or increased pain in the scrotum return to the emergency department immediately

## 2017-04-10 NOTE — ED Triage Notes (Signed)
Pt reports bilateral testicle and penis swelling since Wed.  Hernia Sx on Monday.

## 2017-04-13 ENCOUNTER — Encounter: Payer: Self-pay | Admitting: General Surgery

## 2017-04-13 ENCOUNTER — Ambulatory Visit (INDEPENDENT_AMBULATORY_CARE_PROVIDER_SITE_OTHER): Payer: Self-pay | Admitting: General Surgery

## 2017-04-13 VITALS — BP 152/96 | HR 89 | Temp 97.1°F | Resp 18 | Ht 70.0 in | Wt 250.0 lb

## 2017-04-13 DIAGNOSIS — Z09 Encounter for follow-up examination after completed treatment for conditions other than malignant neoplasm: Secondary | ICD-10-CM

## 2017-04-13 MED ORDER — OXYCODONE-ACETAMINOPHEN 5-325 MG PO TABS: 1.0000 | ORAL_TABLET | Freq: Four times a day (QID) | ORAL | 0 refills | Status: DC | PRN

## 2017-04-13 NOTE — Progress Notes (Signed)
Subjective:     Steve Romero  Status post recurrent left inguinal herniorrhaphy with mesh. Patient has 6 out of 10 pain at the incision site. He has noticed that his scrotum is swollen. Objective:    BP (!) 152/96   Pulse 89   Temp (!) 97.1 F (36.2 C)   Resp 18   Ht 5\' 10"  (1.778 m)   Wt 250 lb (113.4 kg)   BMI 35.87 kg/m   General:  alert, cooperative and no distress  Left inguinal incision site healing well. His scrotum is more edematous at the skin level without significant fluid collection within it.     Assessment:    Doing well postoperatively. Scrotal scarring that is more related to edematous skin noted is fluid.    Plan:   I explained to the patient that this should resolve with time. I did reorder his pain medication. We will see him again in 2 weeks.

## 2017-04-27 ENCOUNTER — Encounter: Payer: Self-pay | Admitting: General Surgery

## 2017-04-27 ENCOUNTER — Ambulatory Visit (INDEPENDENT_AMBULATORY_CARE_PROVIDER_SITE_OTHER): Payer: Self-pay | Admitting: General Surgery

## 2017-04-27 ENCOUNTER — Ambulatory Visit: Payer: Medicaid Other | Admitting: General Surgery

## 2017-04-27 VITALS — BP 155/98 | HR 89 | Temp 98.7°F | Resp 18 | Ht 70.0 in | Wt 252.0 lb

## 2017-04-27 DIAGNOSIS — Z09 Encounter for follow-up examination after completed treatment for conditions other than malignant neoplasm: Secondary | ICD-10-CM

## 2017-04-27 NOTE — Progress Notes (Signed)
Subjective:     Steve Romero  Status post left recurrent inguinal herniorrhaphy with mesh. Patient still has scrotal edema. He also has some pitting edema of his right foot. Patient denies any calf pain. He has no problems with his left leg. He has no shortness of breath. Objective:    BP (!) 155/98   Pulse 89   Temp 98.7 F (37.1 C)   Resp 18   Ht 5\' 10"  (1.778 m)   Wt 252 lb (114.3 kg)   BMI 36.16 kg/m   General:  alert, cooperative and no distress  Left inguinal incision healing well. Scrotal and penile pitting edema noted.  Mild right ankle swelling is noted. Negative Homan's sign.     Assessment:    Edema of scrotum and penile skin. Etiology is not clear to me. Given the recurrent nature of the surgery, he may have some lymphatic disruption   No evidence for DVT.   Plan:   I told patient that should his leg swelling worsen, he should go to the emergency room. He will follow-up in my office in 2 weeks.

## 2017-04-28 ENCOUNTER — Ambulatory Visit: Payer: Medicaid Other | Admitting: Urology

## 2017-05-11 ENCOUNTER — Encounter: Payer: Self-pay | Admitting: General Surgery

## 2017-05-11 ENCOUNTER — Ambulatory Visit (INDEPENDENT_AMBULATORY_CARE_PROVIDER_SITE_OTHER): Payer: Medicaid Other | Admitting: General Surgery

## 2017-05-11 VITALS — BP 138/91 | HR 73 | Temp 96.0°F | Resp 18 | Ht 70.0 in | Wt 262.0 lb

## 2017-05-11 DIAGNOSIS — Z09 Encounter for follow-up examination after completed treatment for conditions other than malignant neoplasm: Secondary | ICD-10-CM

## 2017-05-11 NOTE — Progress Notes (Signed)
Subjective:     Steve Romero  Patient states the swelling has decreased around the scrotum and penis.  Has no incisional pain. Objective:    BP (!) 138/91   Pulse 73   Temp (!) 96 F (35.6 C)   Resp 18   Ht 5\' 10"  (1.778 m)   Wt 262 lb (118.8 kg)   BMI 37.59 kg/m   General:  alert, cooperative and no distress  Left inguinal incision healing well.  Scrotal and penile skin edema decreased.     Assessment:    Doing well postoperatively.    Plan:   Patient may increase his activity.  I told him the swelling should decrease with time.  He was fine with that.  Follow-up here as needed.

## 2017-08-20 IMAGING — CT CT ABD-PELV W/ CM
2 of 5 series · 16 of 46 positions shown, 18 images · IV contrast (Isovue)
Comparison: 09/11/2012

CLINICAL DATA: Left inguinal pain for several days. History of a
hernia repair.

EXAM:
CT ABDOMEN AND PELVIS WITH CONTRAST
TECHNIQUE: Multidetector CT imaging of the abdomen and pelvis was performed
using the standard protocol following bolus administration of
intravenous contrast.
CONTRAST:  100mL [id number] IOPAMIDOL ([id number]) INJECTION 61%

[Series 2: axial st · axial · 0.84mm/px · z∈[-476,-16]mm · 13 of 106 slices shown, 15 images]
[im 7/106  soft-tissue]
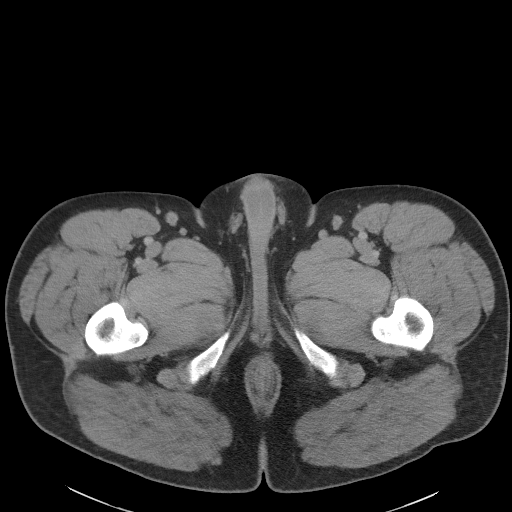
[im 7/106  bone]
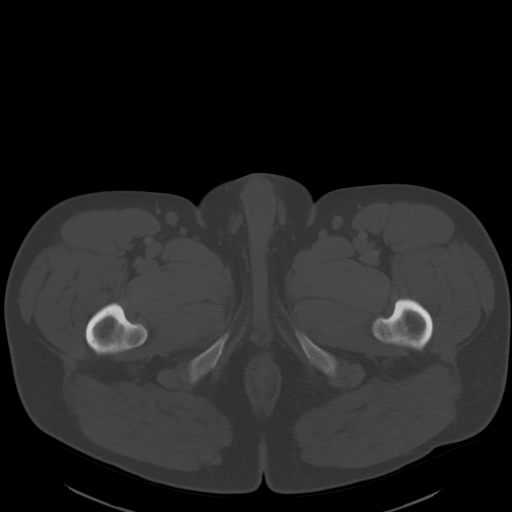
[im 13/106  soft-tissue]
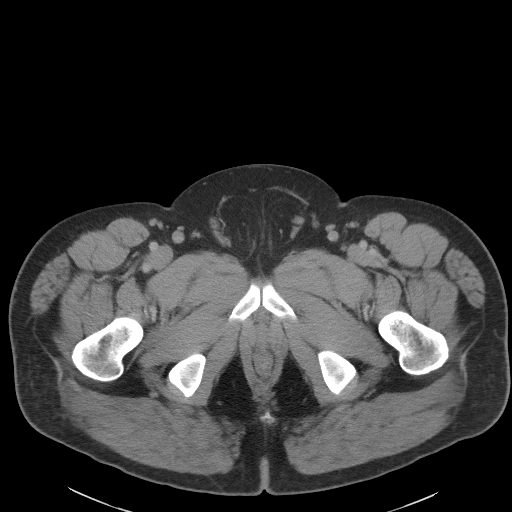
[im 25/106  soft-tissue]
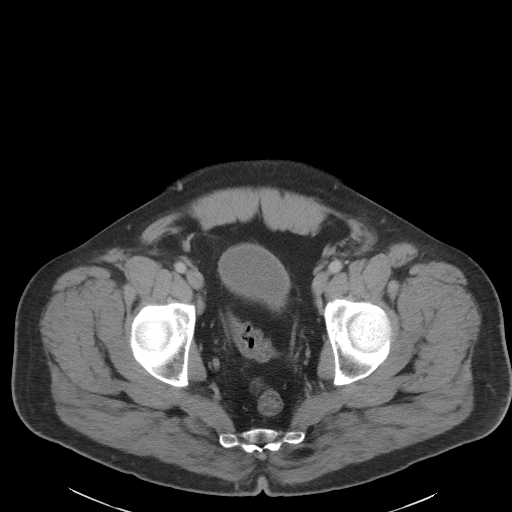
[im 31/106  soft-tissue]
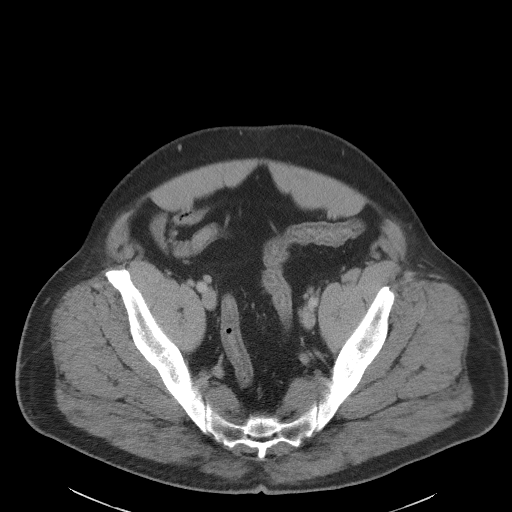
[im 38/106  soft-tissue]
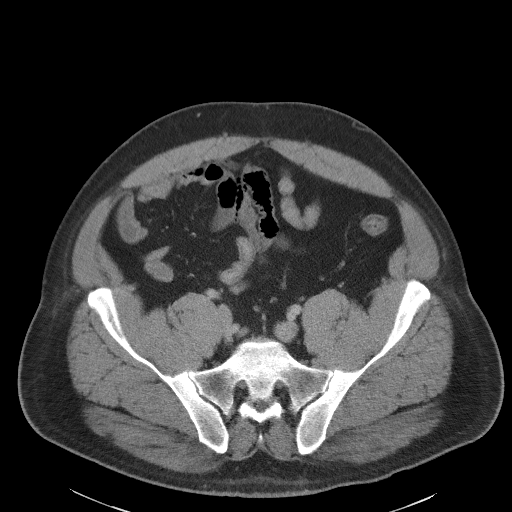
[im 44/106  soft-tissue]
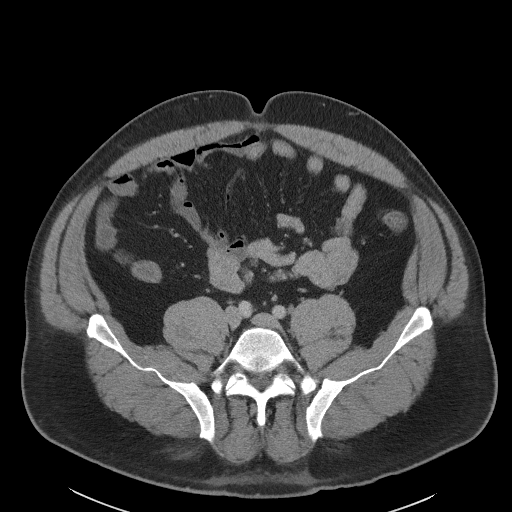
[im 56/106  soft-tissue]
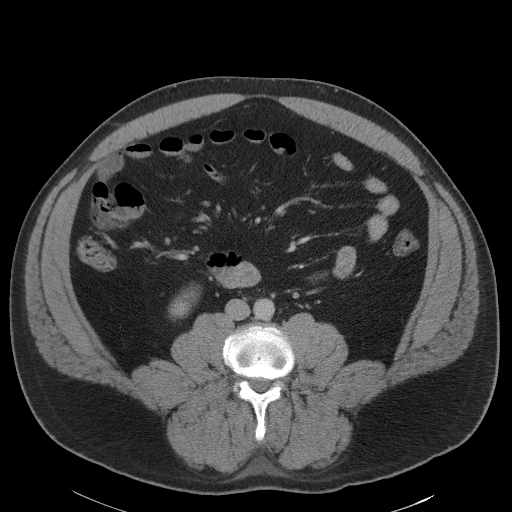
[im 62/106  soft-tissue]
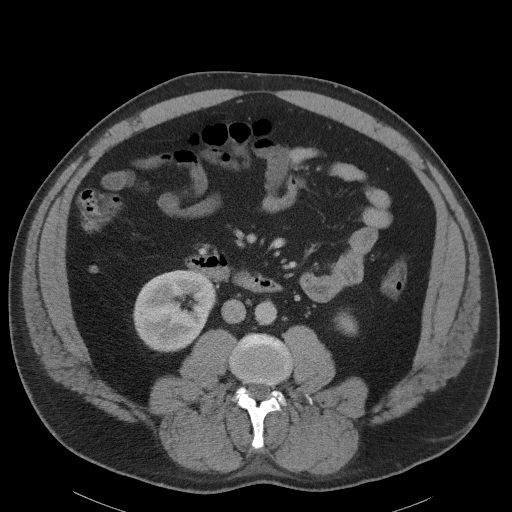
[im 68/106  soft-tissue]
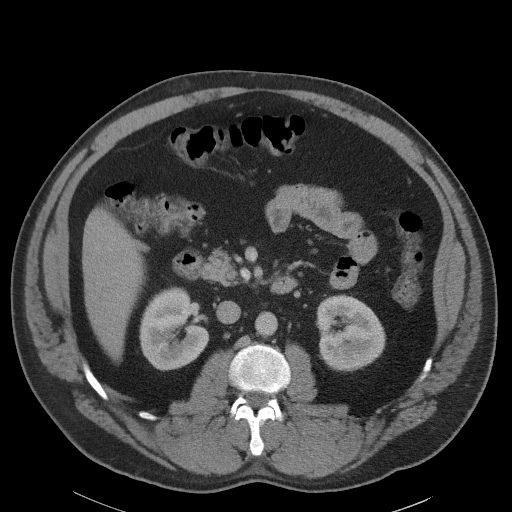
[im 68/106  bone]
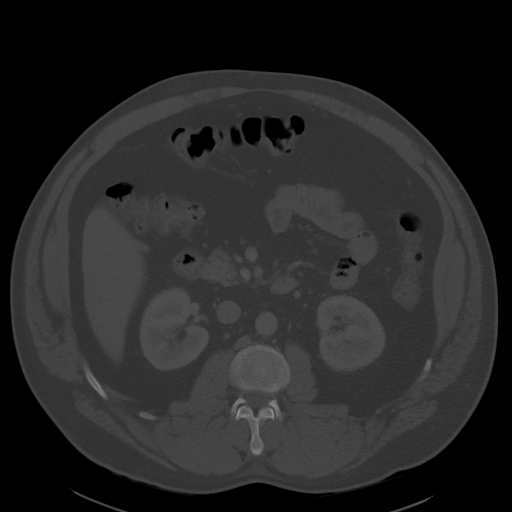
[im 75/106  soft-tissue]
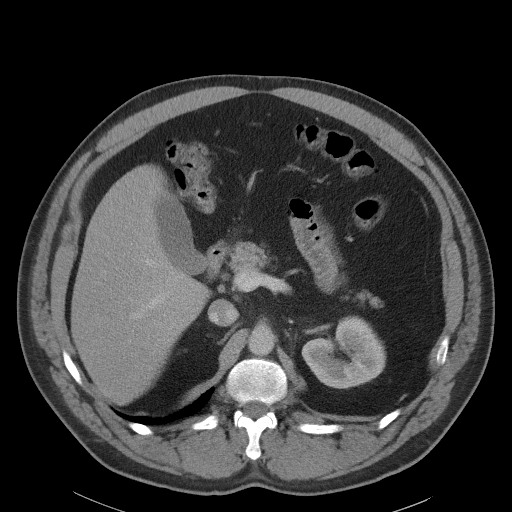
[im 81/106  soft-tissue]
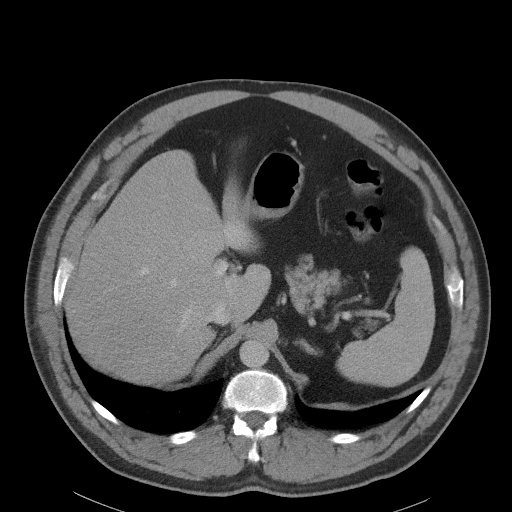
[im 93/106  soft-tissue]
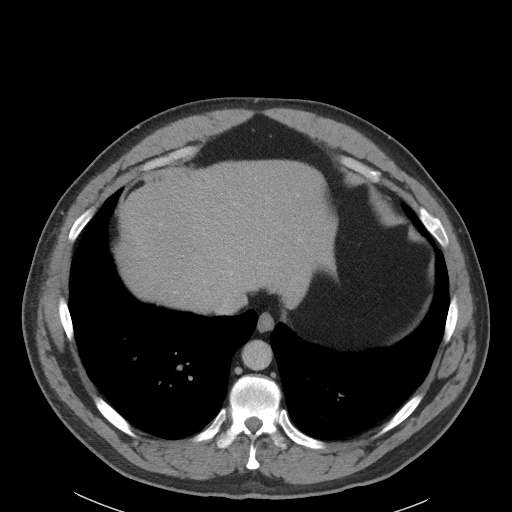
[im 99/106  soft-tissue]
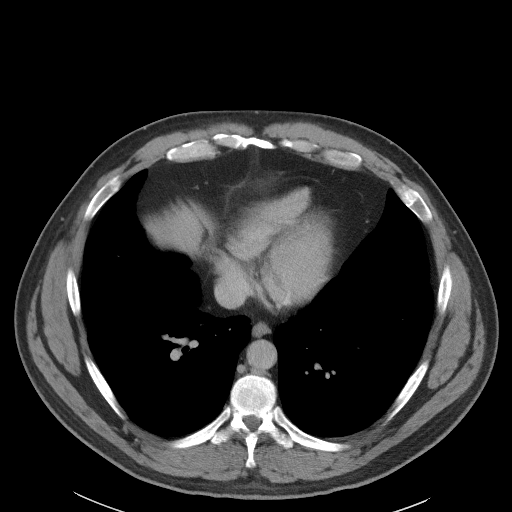

[Series 5: coronal st · coronal · 0.83mm/px · 3 of 118 slices shown]
[im 40/118  soft-tissue]
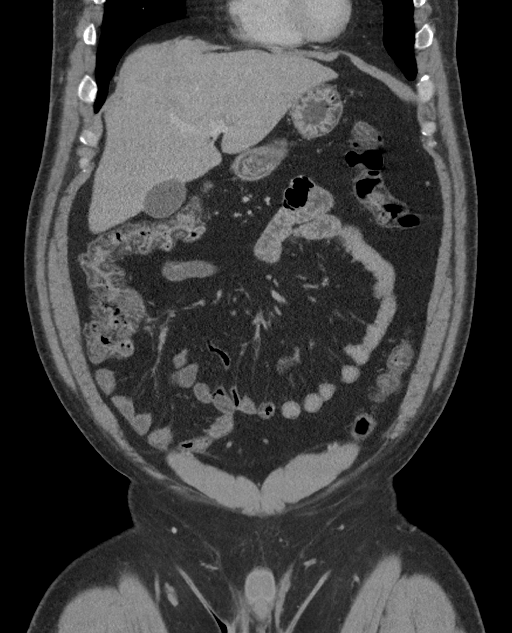
[im 53/118  soft-tissue]
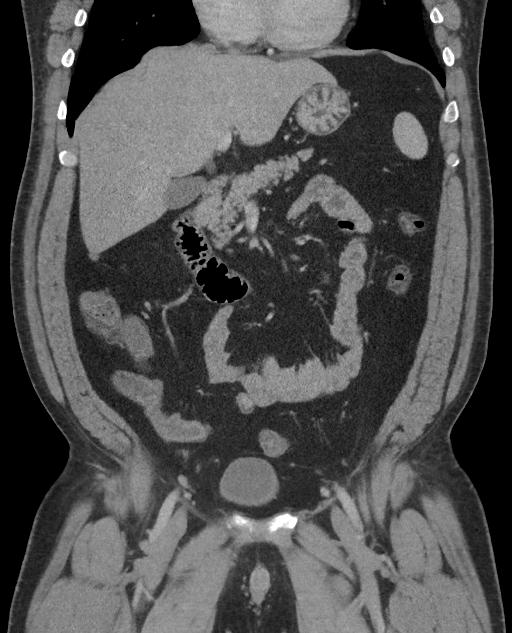
[im 66/118  soft-tissue]
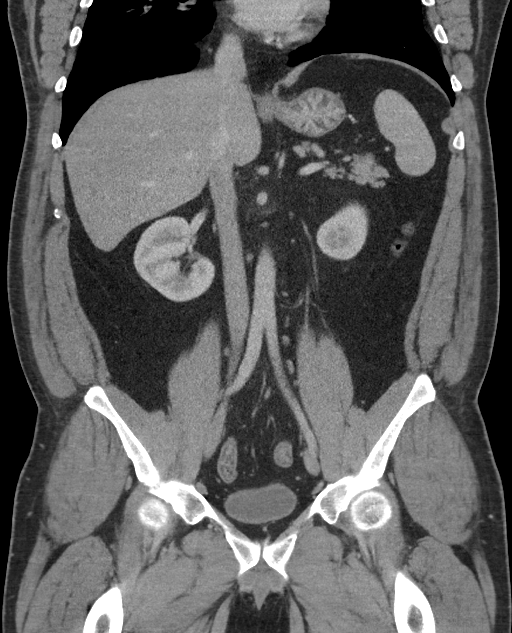

[16 of 46 positions shown; findings below may reference images not displayed]

FINDINGS: Lower chest: No acute abnormality.

Hepatobiliary: No focal liver abnormality is seen. No gallstones,
gallbladder wall thickening, or biliary dilatation.

Pancreas: Unremarkable. No pancreatic ductal dilatation or
surrounding inflammatory changes.

Spleen: Normal in size without focal abnormality.

Adrenals/Urinary Tract: Adrenal glands are unremarkable. Kidneys are
normal, without renal calculi, focal lesion, or hydronephrosis.
Bladder is unremarkable.

Stomach/Bowel: Stomach is within normal limits. Appendix appears
normal. No evidence of bowel wall thickening, distention, or
inflammatory changes.

Vascular/Lymphatic: No significant vascular findings are present. No
enlarged abdominal or pelvic lymph nodes.

Reproductive: Unremarkable.

Other: Fat protrudes through the right inguinal ring, which has
increased in amount since the prior CT. No bowel enters this. No
other abdominal wall hernia. No ascites.

Musculoskeletal: No fracture or acute finding. No osteoblastic or
osteolytic lesions. Disc degenerative changes noted at L3-L4 stable
from the prior CT.
IMPRESSION: 1. Fat containing right inguinal hernia is slightly larger than it
was on the prior CT. No bowel enters this hernia. There is no other
abdominal wall hernia.
2. No other significant abnormalities.  No acute findings.

## 2018-02-01 ENCOUNTER — Other Ambulatory Visit: Payer: Self-pay

## 2018-02-01 ENCOUNTER — Emergency Department (HOSPITAL_COMMUNITY)
Admission: EM | Admit: 2018-02-01 | Discharge: 2018-02-01 | Disposition: A | Discharge status: Home / Self Care | Payer: Medicaid Other | Attending: Emergency Medicine | Admitting: Emergency Medicine

## 2018-02-01 ENCOUNTER — Encounter (HOSPITAL_COMMUNITY): Payer: Self-pay | Admitting: Emergency Medicine

## 2018-02-01 DIAGNOSIS — Z79899 Other long term (current) drug therapy: Secondary | ICD-10-CM | POA: Diagnosis not present

## 2018-02-01 DIAGNOSIS — G8929 Other chronic pain: Secondary | ICD-10-CM | POA: Diagnosis not present

## 2018-02-01 DIAGNOSIS — J449 Chronic obstructive pulmonary disease, unspecified: Secondary | ICD-10-CM | POA: Insufficient documentation

## 2018-02-01 DIAGNOSIS — I10 Essential (primary) hypertension: Secondary | ICD-10-CM | POA: Diagnosis not present

## 2018-02-01 DIAGNOSIS — F17210 Nicotine dependence, cigarettes, uncomplicated: Secondary | ICD-10-CM | POA: Insufficient documentation

## 2018-02-01 DIAGNOSIS — M545 Low back pain: Secondary | ICD-10-CM | POA: Insufficient documentation

## 2018-02-01 MED ORDER — METHOCARBAMOL 500 MG PO TABS
500.0000 mg | ORAL_TABLET | Freq: Once | ORAL | Status: AC
  Administered 2018-02-01: 500 mg via ORAL

## 2018-02-01 MED ORDER — LIDOCAINE 5 % EX PTCH
1.0000 | MEDICATED_PATCH | CUTANEOUS | Status: DC
  Administered 2018-02-01: 1 via TRANSDERMAL

## 2018-02-01 MED ORDER — DEXAMETHASONE SODIUM PHOSPHATE 10 MG/ML IJ SOLN
10.0000 mg | Freq: Once | INTRAMUSCULAR | Status: AC
  Administered 2018-02-01: 10 mg via INTRAMUSCULAR

## 2018-02-01 MED ORDER — OXYCODONE-ACETAMINOPHEN 5-325 MG PO TABS
1.0000 | ORAL_TABLET | Freq: Once | ORAL | Status: AC
  Administered 2018-02-01: 1 via ORAL

## 2018-02-01 MED ORDER — KETOROLAC TROMETHAMINE 30 MG/ML IJ SOLN
30.0000 mg | Freq: Once | INTRAMUSCULAR | Status: AC
  Administered 2018-02-01: 30 mg via INTRAMUSCULAR

## 2018-02-01 MED ORDER — METHOCARBAMOL 500 MG PO TABS: 500.0000 mg | ORAL_TABLET | Freq: Three times a day (TID) | ORAL | 0 refills | Status: DC | PRN

## 2018-02-01 NOTE — ED Triage Notes (Signed)
Pt is here with his dad who is admitted to the hospital. Has been staying here with him. Has had flare of chronic back pain tonight and now feels like it is too painful to walk.  Located center of lower lumbar.

## 2018-02-01 NOTE — ED Provider Notes (Signed)
[facility] EMERGENCY DEPARTMENT Provider Note   CSN: [id number] Arrival date & time: 02/01/18  0132     History   Chief Complaint Chief Complaint  Patient presents with  . Back Pain    HPI Steve Romero is a 47 y.o. male.  47 year old male with a history of COPD, dyslipidemia, hypertension who presents to the emergency department for low back pain.  He reports a history of chronic back pain for which she has been taking Aleve.  He describes the pain as burning in his low back.  It is nonradiating.  It is aggravated with ambulation.  He also feels that sitting in the chair is in the hospital with his father has made it worse.  No history of trauma or injury.  No associated fevers.     Past Medical History:  Diagnosis Date  . Anxiety 10-2014  . Bipolar disorder ([place]) age 55  . COPD (chronic obstructive pulmonary disease) ([place]) 2016  . Depression age 66  . Enlarged prostate   . Hyperlipidemia 2016  . Hypertension 2013  . Schizoaffective disorder ([place]) 11-13-14  . Sleep apnea   . Thyroid disease 11-2014    Patient Active Problem List   Diagnosis Date Noted  . Unilateral recurrent inguinal hernia without obstruction or gangrene   . Carpal tunnel syndrome, left   . Obesity, unspecified 09/01/2015  . HTN (hypertension) 04/22/2015  . Hyperlipidemia 04/22/2015  . Schizoaffective disorder ([place]) 04/22/2015  . Anxiety state 04/22/2015  . Cigarette nicotine dependence without complication 25/42/7062  . Edema 04/22/2015  . Lymphadenopathy 04/22/2015    Past Surgical History:  Procedure Laterality Date  . CARPAL TUNNEL RELEASE Left 02/21/2016   Procedure: CARPAL TUNNEL RELEASE;  Surgeon: Carole Civil, MD;  Location: [facility];  Service: Orthopedics;  Laterality: Left;  . HERNIA REPAIR Left 2002   groin  . INGUINAL HERNIA REPAIR Left 04/05/2017   Procedure: RECURRENT HERNIA REPAIR INGUINAL ADULT WITH MESH;  Surgeon: [name], MD;  Location: [facility];   Service: General;  Laterality: Left;        Home Medications    Prior to Admission medications   Medication Sig Start Date End Date Taking? Authorizing Provider  naproxen sodium (ALEVE) 220 MG tablet Take 220 mg by mouth 2 (two) times daily as needed (back pain).   Yes [provider]  risperiDONE (RISPERDAL) 0.5 MG tablet Take 0.5 mg by mouth at bedtime.   Yes [provider]  traZODone (DESYREL) 100 MG tablet Take 400 mg by mouth at bedtime.   Yes [provider]  methocarbamol (ROBAXIN) 500 MG tablet Take 1 tablet (500 mg total) by mouth every 8 (eight) hours as needed for muscle spasms. 02/01/18   Antonietta Breach, PA-C  oxyCODONE-acetaminophen (ROXICET) 5-325 MG tablet Take 1 tablet by mouth every 6 (six) hours as needed for severe pain. Patient not taking: Reported on 02/01/2018 04/13/17   [name], MD    Family History Family History  Problem Relation Age of Onset  . Diabetes Father   . Heart disease Father   . Heart disease Maternal Grandmother   . Stroke Maternal Grandfather     Social History Social History   Tobacco Use  . Smoking status: Current Every Day Smoker    Packs/day: 0.25    Years: 32.00    Pack years: 8.00    Types: Cigarettes  . Smokeless tobacco: Never Used  Substance Use Topics  . Alcohol use: No  . Drug  use: No     Allergies   Desipramine   Review of Systems Review of Systems Ten systems reviewed and are negative for acute change, except as noted in the HPI.    Physical Exam Updated Vital Signs BP (!) 148/121 (BP Location: Right Arm)   Pulse 92   Temp 98.6 F (37 C) (Oral)   Resp (!) 22   Ht 5\' 10"  (1.778 m)   Wt 122.5 kg (270 lb)   SpO2 98%   BMI 38.74 kg/m   Physical Exam  Constitutional: He is oriented to person, place, and time. He appears well-developed and well-nourished. No distress.  Nontoxic appearing and in NAD  HENT:  Head: Normocephalic and atraumatic.  Eyes: Conjunctivae and EOM are  normal. No scleral icterus.  Neck: Normal range of motion.  Pulmonary/Chest: Effort normal. No stridor. No respiratory distress.  Respirations even and unlabored  Musculoskeletal: Normal range of motion.  TTP to the lumbar midline without bony deformities, step offs, crepitus. TTP at L4/5 approximately. No spasms appreciated.  Neurological: He is alert and oriented to person, place, and time. He exhibits normal muscle tone. Coordination normal.  Sensation to light touch intact. Patient moving all extremities.  Skin: Skin is warm and dry. No rash noted. He is not diaphoretic. No erythema. No pallor.  Psychiatric: He has a normal mood and affect. His behavior is normal.  Nursing note and vitals reviewed.    ED Treatments / Results  Labs (all labs ordered are listed, but only abnormal results are displayed) Labs Reviewed - No data to display  EKG None  Radiology No results found.  Procedures Procedures (including critical care time)  Medications Ordered in ED Medications  lidocaine (LIDODERM) 5 % 1 patch (1 patch Transdermal Patch Applied 02/01/18 0245)  dexamethasone (DECADRON) injection 10 mg (10 mg Intramuscular Given 02/01/18 0244)  oxyCODONE-acetaminophen (PERCOCET/ROXICET) 5-325 MG per tablet 1 tablet (1 tablet Oral Given 02/01/18 0245)  methocarbamol (ROBAXIN) tablet 500 mg (500 mg Oral Given 02/01/18 0244)  ketorolac (TORADOL) 30 MG/ML injection 30 mg (30 mg Intramuscular Given 02/01/18 0244)     Initial Impression / Assessment and Plan / ED Course  I have reviewed the triage vital signs and the nursing notes.  Pertinent labs & imaging results that were available during my care of the patient were reviewed by me and considered in my medical decision making (see chart for details).     Patient with back pain; acute exacerbation of chronic symptoms.  Patient neurovascularly intact.  No history of recent trauma.  Patient can walk but states is painful.  No loss of bowel or  bladder control.  No concern for cauda equina.  No fever, night sweats, weight loss, h/o cancer, IVDU.  RICE protocol and pain medicine indicated and discussed with patient.  Return precautions discussed and provided. Patient discharged in stable condition with no unaddressed concerns.   Final Clinical Impressions(s) / ED Diagnoses   Final diagnoses:  Chronic midline low back pain without sciatica    ED Discharge Orders        Ordered    methocarbamol (ROBAXIN) 500 MG tablet  Every 8 hours PRN     02/01/18 0339       Antonietta Breach, PA-C 02/01/18 0359    Dina Rich Barbette Hair, MD 02/01/18 [phone]

## 2018-02-01 NOTE — Discharge Instructions (Signed)
Alternate ice and heat to areas of injury 3-4 times per day to limit inflammation and spasm.  Avoid strenuous activity and heavy lifting.  We recommend consistent use of naproxen in addition to Robaxin for muscle spasms. Do not drive or drink alcohol after taking Robaxin as it may make you drowsy and impair your judgment.  We recommend follow-up with a primary care doctor to ensure resolution of symptoms.  Return to the ED for any new or concerning symptoms. 

## 2018-02-01 NOTE — ED Notes (Signed)
Pt ambulating in hall with no difficulty. States his pain has improved.

## 2018-11-17 NOTE — Progress Notes (Deleted)
Psychiatric Initial Adult Assessment   Patient Identification: Steve Romero MRN:  [id number] Date of Evaluation:  11/17/2018 Referral Source: *** Chief Complaint:   Visit Diagnosis: No diagnosis found.  History of Present Illness:   Steve Romero is a 48 y.o. year old male with a history of schizoaffective disorder by history, hypertension, hyperlipidemia, who is referred for schizoaffective disorder.     Associated Signs/Symptoms: Depression Symptoms:  {DEPRESSION SYMPTOMS:20000} (Hypo) Manic Symptoms:  {BHH MANIC SYMPTOMS:22872} Anxiety Symptoms:  {BHH ANXIETY SYMPTOMS:22873} Psychotic Symptoms:  {BHH PSYCHOTIC SYMPTOMS:22874} PTSD Symptoms: {BHH PTSD SYMPTOMS:22875}  Past Psychiatric History:  Outpatient:  Psychiatry admission:  Previous suicide attempt:  Past trials of medication:  History of violence:   Previous Psychotropic Medications: {YES/NO:21197}  Substance Abuse History in the last 12 months:  {yes no:314532}  Consequences of Substance Abuse: {BHH CONSEQUENCES OF SUBSTANCE ABUSE:22880}  Past Medical History:  Past Medical History:  Diagnosis Date  . Anxiety 10-2014  . Bipolar disorder ([place]) age 63  . COPD (chronic obstructive pulmonary disease) ([place]) 2016  . Depression age 60  . Enlarged prostate   . Hyperlipidemia 2016  . Hypertension 2013  . Schizoaffective disorder ([place]) 11-13-14  . Sleep apnea   . Thyroid disease 11-2014    Past Surgical History:  Procedure Laterality Date  . CARPAL TUNNEL RELEASE Left 02/21/2016   Procedure: CARPAL TUNNEL RELEASE;  Surgeon: Carole Civil, MD;  Location: [facility];  Service: Orthopedics;  Laterality: Left;  . HERNIA REPAIR Left 2002   groin  . INGUINAL HERNIA REPAIR Left 04/05/2017   Procedure: RECURRENT HERNIA REPAIR INGUINAL ADULT WITH MESH;  Surgeon: [name], MD;  Location: [facility];  Service: General;  Laterality: Left;    Family Psychiatric History: ***  Family History:  Family History  Problem  Relation Age of Onset  . Diabetes Father   . Heart disease Father   . Heart disease Maternal Grandmother   . Stroke Maternal Grandfather     Social History:   Social History   Socioeconomic History  . Marital status: Single    Spouse name: Not on file  . Number of children: Not on file  . Years of education: Not on file  . Highest education level: Not on file  Occupational History  . Not on file  Social Needs  . Financial resource strain: Not on file  . Food insecurity:    Worry: Not on file    Inability: Not on file  . Transportation needs:    Medical: Not on file    Non-medical: Not on file  Tobacco Use  . Smoking status: Current Every Day Smoker    Packs/day: 0.25    Years: 32.00    Pack years: 8.00    Types: Cigarettes  . Smokeless tobacco: Never Used  Substance and Sexual Activity  . Alcohol use: No  . Drug use: No  . Sexual activity: Never    Birth control/protection: None  Lifestyle  . Physical activity:    Days per week: Not on file    Minutes per session: Not on file  . Stress: Not on file  Relationships  . Social connections:    Talks on phone: Not on file    Gets together: Not on file    Attends religious service: Not on file    Active member of club or organization: Not on file    Attends meetings of clubs or organizations: Not on file    Relationship status: Not on file  Other Topics Concern  . Not on file  Social History Narrative  . Not on file    Additional Social History: ***  Allergies:   Allergies  Allergen Reactions  . Desipramine Nausea Only and Rash    Metabolic Disorder Labs: No results found for: HGBA1C, MPG No results found for: PROLACTIN Lab Results  Component Value Date   CHOL 192 04/22/2015   TRIG 231 (H) 04/22/2015   HDL 28 (L) 04/22/2015   CHOLHDL 6.9 (H) 04/22/2015   VLDL 46 (H) 04/22/2015   LDLCALC 118 04/22/2015   Lab Results  Component Value Date   TSH 1.954 04/22/2015    Therapeutic Level Labs: No  results found for: LITHIUM No results found for: CBMZ No results found for: VALPROATE  Current Medications: Current Outpatient Medications  Medication Sig Dispense Refill  . methocarbamol (ROBAXIN) 500 MG tablet Take 1 tablet (500 mg total) by mouth every 8 (eight) hours as needed for muscle spasms. 20 tablet 0  . naproxen sodium (ALEVE) 220 MG tablet Take 220 mg by mouth 2 (two) times daily as needed (back pain).    Marland Kitchen oxyCODONE-acetaminophen (ROXICET) 5-325 MG tablet Take 1 tablet by mouth every 6 (six) hours as needed for severe pain. (Patient not taking: Reported on 02/01/2018) 28 tablet 0  . risperiDONE (RISPERDAL) 0.5 MG tablet Take 0.5 mg by mouth at bedtime.    . traZODone (DESYREL) 100 MG tablet Take 400 mg by mouth at bedtime.     No current facility-administered medications for this visit.     Musculoskeletal: Strength & Muscle Tone: N/A Gait & Station: N/A Patient leans: N/A  Psychiatric Specialty Exam: ROS  There were no vitals taken for this visit.There is no height or weight on file to calculate BMI.  General Appearance: {Appearance:22683}  Eye Contact:  {BHH EYE CONTACT:22684}  Speech:  Clear and Coherent  Volume:  Normal  Mood:  {BHH MOOD:22306}  Affect:  {Affect (PAA):22687}  Thought Process:  Coherent  Orientation:  Full (Time, Place, and Person)  Thought Content:  Logical  Suicidal Thoughts:  {ST/HT (PAA):22692}  Homicidal Thoughts:  {ST/HT (PAA):22692}  Memory:  Immediate;   Good  Judgement:  {Judgement (PAA):22694}  Insight:  {Insight (PAA):22695}  Psychomotor Activity:  Normal  Concentration:  Concentration: Good and Attention Span: Good  Recall:  Good  Fund of Knowledge:Good  Language: Good  Akathisia:  No  Handed:  {Handed:22697}  AIMS (if indicated):  not done  Assets:  Communication Skills Desire for Improvement  ADL's:  Intact  Cognition: WNL  Sleep:  {BHH GOOD/FAIR/POOR:22877}   Screenings:   Assessment and Plan:   Assessment  Plan  The patient demonstrates the following risk factors for suicide: Chronic risk factors for suicide include: {Chronic Risk Factors for OEVOJJK:09381829}. Acute risk factors for suicide include: {Acute Risk Factors for HBZJIRC:78938101}. Protective factors for this patient include: {Protective Factors for Suicide BPZW:25852778}. Considering these factors, the overall suicide risk at this point appears to be {Desc; low/moderate/high:110033}. Patient {ACTION; IS/IS EUM:35361443} appropriate for outpatient follow up.    Norman Clay, MD 5/14/20201:14 PM

## 2018-11-22 ENCOUNTER — Ambulatory Visit (HOSPITAL_COMMUNITY): Payer: Self-pay | Admitting: Psychiatry

## 2019-01-18 NOTE — Progress Notes (Deleted)
Psychiatric Initial Adult Assessment   Patient Identification: Steve Romero MRN:  [id number] Date of Evaluation:  01/18/2019 Referral Source: Harlene Salts, PA-C Chief Complaint:   Visit Diagnosis: No diagnosis found.  History of Present Illness:   Steve Romero is a 48 y.o. year old male with a history of schizoaffective disorder by chart, hypertension, hyperlipidemia, COPD, sleep apnea, who is referred for schizoaffective disorder.   [facility]?   Associated Signs/Symptoms: Depression Symptoms:  {DEPRESSION SYMPTOMS:20000} (Hypo) Manic Symptoms:  {BHH MANIC SYMPTOMS:22872} Anxiety Symptoms:  {BHH ANXIETY SYMPTOMS:22873} Psychotic Symptoms:  {BHH PSYCHOTIC SYMPTOMS:22874} PTSD Symptoms: {BHH PTSD SYMPTOMS:22875}  Past Psychiatric History:  Outpatient:  Psychiatry admission:  Previous suicide attempt:  Past trials of medication:  History of violence:   Previous Psychotropic Medications: {YES/NO:21197}  Substance Abuse History in the last 12 months:  {yes no:314532}  Consequences of Substance Abuse: {BHH CONSEQUENCES OF SUBSTANCE ABUSE:22880}  Past Medical History:  Past Medical History:  Diagnosis Date  . Anxiety 10-2014  . Bipolar disorder ([place]) age 72  . COPD (chronic obstructive pulmonary disease) ([place]) 2016  . Depression age 42  . Enlarged prostate   . Hyperlipidemia 2016  . Hypertension 2013  . Schizoaffective disorder ([place]) 11-13-14  . Sleep apnea   . Thyroid disease 11-2014    Past Surgical History:  Procedure Laterality Date  . CARPAL TUNNEL RELEASE Left 02/21/2016   Procedure: CARPAL TUNNEL RELEASE;  Surgeon: Carole Civil, MD;  Location: [facility];  Service: Orthopedics;  Laterality: Left;  . HERNIA REPAIR Left 2002   groin  . INGUINAL HERNIA REPAIR Left 04/05/2017   Procedure: RECURRENT HERNIA REPAIR INGUINAL ADULT WITH MESH;  Surgeon: [name], MD;  Location: [facility];  Service: General;  Laterality: Left;    Family Psychiatric History:  ***  Family History:  Family History  Problem Relation Age of Onset  . Diabetes Father   . Heart disease Father   . Heart disease Maternal Grandmother   . Stroke Maternal Grandfather     Social History:   Social History   Socioeconomic History  . Marital status: Single    Spouse name: Not on file  . Number of children: Not on file  . Years of education: Not on file  . Highest education level: Not on file  Occupational History  . Not on file  Social Needs  . Financial resource strain: Not on file  . Food insecurity    Worry: Not on file    Inability: Not on file  . Transportation needs    Medical: Not on file    Non-medical: Not on file  Tobacco Use  . Smoking status: Current Every Day Smoker    Packs/day: 0.25    Years: 32.00    Pack years: 8.00    Types: Cigarettes  . Smokeless tobacco: Never Used  Substance and Sexual Activity  . Alcohol use: No  . Drug use: No  . Sexual activity: Never    Birth control/protection: None  Lifestyle  . Physical activity    Days per week: Not on file    Minutes per session: Not on file  . Stress: Not on file  Relationships  . Social Herbalist on phone: Not on file    Gets together: Not on file    Attends religious service: Not on file    Active member of club or organization: Not on file    Attends meetings of clubs or organizations: Not on file  Relationship status: Not on file  Other Topics Concern  . Not on file  Social History Narrative  . Not on file    Additional Social History: ***  Allergies:   Allergies  Allergen Reactions  . Desipramine Nausea Only and Rash    Metabolic Disorder Labs: No results found for: HGBA1C, MPG No results found for: PROLACTIN Lab Results  Component Value Date   CHOL 192 04/22/2015   TRIG 231 (H) 04/22/2015   HDL 28 (L) 04/22/2015   CHOLHDL 6.9 (H) 04/22/2015   VLDL 46 (H) 04/22/2015   LDLCALC 118 04/22/2015   Lab Results  Component Value Date   TSH 1.954  04/22/2015    Therapeutic Level Labs: No results found for: LITHIUM No results found for: CBMZ No results found for: VALPROATE  Current Medications: Current Outpatient Medications  Medication Sig Dispense Refill  . methocarbamol (ROBAXIN) 500 MG tablet Take 1 tablet (500 mg total) by mouth every 8 (eight) hours as needed for muscle spasms. 20 tablet 0  . naproxen sodium (ALEVE) 220 MG tablet Take 220 mg by mouth 2 (two) times daily as needed (back pain).    Marland Kitchen oxyCODONE-acetaminophen (ROXICET) 5-325 MG tablet Take 1 tablet by mouth every 6 (six) hours as needed for severe pain. (Patient not taking: Reported on 02/01/2018) 28 tablet 0  . risperiDONE (RISPERDAL) 0.5 MG tablet Take 0.5 mg by mouth at bedtime.    . traZODone (DESYREL) 100 MG tablet Take 400 mg by mouth at bedtime.     No current facility-administered medications for this visit.     Musculoskeletal: Strength & Muscle Tone: N/A Gait & Station: N/A Patient leans: N/A  Psychiatric Specialty Exam: ROS  There were no vitals taken for this visit.There is no height or weight on file to calculate BMI.  General Appearance: {Appearance:22683}  Eye Contact:  {BHH EYE CONTACT:22684}  Speech:  Clear and Coherent  Volume:  Normal  Mood:  {BHH MOOD:22306}  Affect:  {Affect (PAA):22687}  Thought Process:  Coherent  Orientation:  Full (Time, Place, and Person)  Thought Content:  Logical  Suicidal Thoughts:  {ST/HT (PAA):22692}  Homicidal Thoughts:  {ST/HT (PAA):22692}  Memory:  Immediate;   Good  Judgement:  {Judgement (PAA):22694}  Insight:  {Insight (PAA):22695}  Psychomotor Activity:  Normal  Concentration:  Concentration: Good and Attention Span: Good  Recall:  Good  Fund of Knowledge:Good  Language: Good  Akathisia:  No  Handed:  Right  AIMS (if indicated):  not done  Assets:  Communication Skills Desire for Improvement  ADL's:  Intact  Cognition: WNL  Sleep:  {BHH GOOD/FAIR/POOR:22877}    Screenings:   Assessment and Plan:  Assessment  Plan  The patient demonstrates the following risk factors for suicide: Chronic risk factors for suicide include: {Chronic Risk Factors for UJWJXBJ:47829562}. Acute risk factors for suicide include: {Acute Risk Factors for ZHYQMVH:84696295}. Protective factors for this patient include: {Protective Factors for Suicide MWUX:32440102}. Considering these factors, the overall suicide risk at this point appears to be {Desc; low/moderate/high:110033}. Patient {ACTION; IS/IS VOZ:36644034} appropriate for outpatient follow up.      Norman Clay, MD 7/15/20203:03 PM

## 2019-01-24 ENCOUNTER — Other Ambulatory Visit: Payer: Self-pay

## 2019-01-24 ENCOUNTER — Encounter (HOSPITAL_COMMUNITY): Payer: Medicaid Other | Admitting: Psychiatry

## 2019-01-24 ENCOUNTER — Telehealth (HOSPITAL_COMMUNITY): Payer: Self-pay | Admitting: Psychiatry

## 2019-01-24 NOTE — Progress Notes (Signed)
This encounter was created in error - please disregard.

## 2019-01-24 NOTE — Telephone Encounter (Signed)
Although he was available for the visit, his camera did not work for video visit despite trails of more than 15 mins. Will reschedule the appointment. He denies SI.  8/12 at 10 AM for new intake. [email or web address]

## 2019-02-13 NOTE — Progress Notes (Deleted)
Psychiatric Initial Adult Assessment   Patient Identification: Shawnmichael Parenteau MRN:  [id number] Date of Evaluation:  02/13/2019 Referral Source: *** Chief Complaint:   Visit Diagnosis: No diagnosis found.  History of Present Illness:   Jernard Reiber is a 48 y.o. year old male with a history of schizoaffective disorder by chart, hypertension, hyperlipidemia, COPD, sleep apnea  , who presents for follow up appointment for No diagnosis found.    Associated Signs/Symptoms: Depression Symptoms:  {DEPRESSION SYMPTOMS:20000} (Hypo) Manic Symptoms:  {BHH MANIC SYMPTOMS:22872} Anxiety Symptoms:  {BHH ANXIETY SYMPTOMS:22873} Psychotic Symptoms:  {BHH PSYCHOTIC SYMPTOMS:22874} PTSD Symptoms: {BHH PTSD SYMPTOMS:22875}  Past Psychiatric History:  Outpatient:  Psychiatry admission:  Previous suicide attempt:  Past trials of medication:  History of violence:   Previous Psychotropic Medications: {YES/NO:21197}  Substance Abuse History in the last 12 months:  {yes no:314532}  Consequences of Substance Abuse: {BHH CONSEQUENCES OF SUBSTANCE ABUSE:22880}  Past Medical History:  Past Medical History:  Diagnosis Date  . Anxiety 10-2014  . Bipolar disorder ([place]) age 1  . COPD (chronic obstructive pulmonary disease) ([place]) 2016  . Depression age 52  . Enlarged prostate   . Hyperlipidemia 2016  . Hypertension 2013  . Schizoaffective disorder ([place]) 11-13-14  . Sleep apnea   . Thyroid disease 11-2014    Past Surgical History:  Procedure Laterality Date  . CARPAL TUNNEL RELEASE Left 02/21/2016   Procedure: CARPAL TUNNEL RELEASE;  Surgeon: Carole Civil, MD;  Location: [facility];  Service: Orthopedics;  Laterality: Left;  . HERNIA REPAIR Left 2002   groin  . INGUINAL HERNIA REPAIR Left 04/05/2017   Procedure: RECURRENT HERNIA REPAIR INGUINAL ADULT WITH MESH;  Surgeon: [name], MD;  Location: [facility];  Service: General;  Laterality: Left;    Family Psychiatric History: ***  Family  History:  Family History  Problem Relation Age of Onset  . Diabetes Father   . Heart disease Father   . Heart disease Maternal Grandmother   . Stroke Maternal Grandfather     Social History:   Social History   Socioeconomic History  . Marital status: Single    Spouse name: Not on file  . Number of children: Not on file  . Years of education: Not on file  . Highest education level: Not on file  Occupational History  . Not on file  Social Needs  . Financial resource strain: Not on file  . Food insecurity    Worry: Not on file    Inability: Not on file  . Transportation needs    Medical: Not on file    Non-medical: Not on file  Tobacco Use  . Smoking status: Current Every Day Smoker    Packs/day: 0.25    Years: 32.00    Pack years: 8.00    Types: Cigarettes  . Smokeless tobacco: Never Used  Substance and Sexual Activity  . Alcohol use: No  . Drug use: No  . Sexual activity: Never    Birth control/protection: None  Lifestyle  . Physical activity    Days per week: Not on file    Minutes per session: Not on file  . Stress: Not on file  Relationships  . Social Herbalist on phone: Not on file    Gets together: Not on file    Attends religious service: Not on file    Active member of club or organization: Not on file    Attends meetings of clubs or organizations: Not on file  Relationship status: Not on file  Other Topics Concern  . Not on file  Social History Narrative  . Not on file    Additional Social History: ***  Allergies:   Allergies  Allergen Reactions  . Desipramine Nausea Only and Rash    Metabolic Disorder Labs: No results found for: HGBA1C, MPG No results found for: PROLACTIN Lab Results  Component Value Date   CHOL 192 04/22/2015   TRIG 231 (H) 04/22/2015   HDL 28 (L) 04/22/2015   CHOLHDL 6.9 (H) 04/22/2015   VLDL 46 (H) 04/22/2015   LDLCALC 118 04/22/2015   Lab Results  Component Value Date   TSH 1.954 04/22/2015     Therapeutic Level Labs: No results found for: LITHIUM No results found for: CBMZ No results found for: VALPROATE  Current Medications: Current Outpatient Medications  Medication Sig Dispense Refill  . methocarbamol (ROBAXIN) 500 MG tablet Take 1 tablet (500 mg total) by mouth every 8 (eight) hours as needed for muscle spasms. 20 tablet 0  . naproxen sodium (ALEVE) 220 MG tablet Take 220 mg by mouth 2 (two) times daily as needed (back pain).    Marland Kitchen oxyCODONE-acetaminophen (ROXICET) 5-325 MG tablet Take 1 tablet by mouth every 6 (six) hours as needed for severe pain. (Patient not taking: Reported on 02/01/2018) 28 tablet 0  . risperiDONE (RISPERDAL) 0.5 MG tablet Take 0.5 mg by mouth at bedtime.    . traZODone (DESYREL) 100 MG tablet Take 400 mg by mouth at bedtime.     No current facility-administered medications for this visit.     Musculoskeletal: Strength & Muscle Tone: N/A Gait & Station: N/A Patient leans: N/A  Psychiatric Specialty Exam: ROS  There were no vitals taken for this visit.There is no height or weight on file to calculate BMI.  General Appearance: {Appearance:22683}  Eye Contact:  {BHH EYE CONTACT:22684}  Speech:  Clear and Coherent  Volume:  Normal  Mood:  {BHH MOOD:22306}  Affect:  {Affect (PAA):22687}  Thought Process:  Coherent  Orientation:  Full (Time, Place, and Person)  Thought Content:  Logical  Suicidal Thoughts:  {ST/HT (PAA):22692}  Homicidal Thoughts:  {ST/HT (PAA):22692}  Memory:  Immediate;   Good  Judgement:  {Judgement (PAA):22694}  Insight:  {Insight (PAA):22695}  Psychomotor Activity:  Normal  Concentration:  Concentration: Good and Attention Span: Good  Recall:  Good  Fund of Knowledge:Good  Language: Good  Akathisia:  No  Handed:  Right  AIMS (if indicated):  not done  Assets:  Communication Skills Desire for Improvement  ADL's:  Intact  Cognition: WNL  Sleep:  {BHH GOOD/FAIR/POOR:22877}   Screenings:   Assessment and  Plan:    Plan  The patient demonstrates the following risk factors for suicide: Chronic risk factors for suicide include: {Chronic Risk Factors for HKVQQVZ:56387564}. Acute risk factors for suicide include: {Acute Risk Factors for PPIRJJO:84166063}. Protective factors for this patient include: {Protective Factors for Suicide KZSW:10932355}. Considering these factors, the overall suicide risk at this point appears to be {Desc; low/moderate/high:110033}. Patient {ACTION; IS/IS DDU:20254270} appropriate for outpatient follow up.   Norman Clay, MD 8/10/20204:28 PM

## 2019-02-15 ENCOUNTER — Other Ambulatory Visit: Payer: Self-pay

## 2019-02-15 ENCOUNTER — Telehealth (HOSPITAL_COMMUNITY): Payer: Self-pay | Admitting: Psychiatry

## 2019-02-15 ENCOUNTER — Ambulatory Visit (HOSPITAL_COMMUNITY): Payer: Medicaid Other | Admitting: Psychiatry

## 2019-02-15 NOTE — Telephone Encounter (Signed)
Sent link for video visit through Doxy me ([email or web address]). Patient did not sign in. Called the patient twice for appointment scheduled today. The patient did not answer the phone. No option to leave a voice message.

## 2019-10-04 ENCOUNTER — Other Ambulatory Visit: Payer: Self-pay

## 2019-10-04 ENCOUNTER — Ambulatory Visit (INDEPENDENT_AMBULATORY_CARE_PROVIDER_SITE_OTHER): Payer: Medicaid Other | Admitting: Family Medicine

## 2019-10-04 ENCOUNTER — Encounter: Payer: Self-pay | Admitting: Family Medicine

## 2019-10-04 VITALS — BP 124/80 | HR 97 | Temp 98.4°F | Ht 70.0 in | Wt 296.0 lb

## 2019-10-04 DIAGNOSIS — F259 Schizoaffective disorder, unspecified: Secondary | ICD-10-CM

## 2019-10-04 DIAGNOSIS — I10 Essential (primary) hypertension: Secondary | ICD-10-CM

## 2019-10-04 DIAGNOSIS — G8929 Other chronic pain: Secondary | ICD-10-CM

## 2019-10-04 DIAGNOSIS — Z7689 Persons encountering health services in other specified circumstances: Secondary | ICD-10-CM

## 2019-10-04 DIAGNOSIS — E782 Mixed hyperlipidemia: Secondary | ICD-10-CM | POA: Diagnosis not present

## 2019-10-04 DIAGNOSIS — M5442 Lumbago with sciatica, left side: Secondary | ICD-10-CM

## 2019-10-04 DIAGNOSIS — M7989 Other specified soft tissue disorders: Secondary | ICD-10-CM

## 2019-10-04 DIAGNOSIS — Z114 Encounter for screening for human immunodeficiency virus [HIV]: Secondary | ICD-10-CM

## 2019-10-04 DIAGNOSIS — Z125 Encounter for screening for malignant neoplasm of prostate: Secondary | ICD-10-CM

## 2019-10-04 LAB — BAYER DCA HB A1C WAIVED: HB A1C (BAYER DCA - WAIVED): 6 % (ref <7.0)

## 2019-10-04 NOTE — Patient Instructions (Signed)
Lipoma  A lipoma is a noncancerous (benign) tumor that is made up of fat cells. This is a very common type of soft-tissue growth. Lipomas are usually found under the skin (subcutaneous). They may occur in any tissue of the body that contains fat. Common areas for lipomas to appear include the back, arms, shoulders, buttocks, and thighs. Lipomas grow slowly, and they are usually painless. Most lipomas do not cause problems and do not require treatment. What are the causes? The cause of this condition is not known. What increases the risk? You are more likely to develop this condition if:  You are 40-60 years old.  You have a family history of lipomas. What are the signs or symptoms? A lipoma usually appears as a small, round bump under the skin. In most cases, the lump will:  Feel soft or rubbery.  Not cause pain or other symptoms. However, if a lipoma is located in an area where it pushes on nerves, it can become painful or cause other symptoms. How is this diagnosed? A lipoma can usually be diagnosed with a physical exam. You may also have tests to confirm the diagnosis and to rule out other conditions. Tests may include:  Imaging tests, such as a CT scan or an MRI.  Removal of a tissue sample to be looked at under a microscope (biopsy). How is this treated? Treatment for this condition depends on the size of the lipoma and whether it is causing any symptoms.  For small lipomas that are not causing problems, no treatment is needed.  If a lipoma is bigger or it causes problems, surgery may be done to remove the lipoma. Lipomas can also be removed to improve appearance. Most often, the procedure is done after applying a medicine that numbs the area (local anesthetic).  Liposuction may be done to reduce the size of the lipoma before it is removed through surgery, or it may be done to remove the lipoma. Lipomas are removed with this method in order to limit incision size and scarring. A  liposuction tube is inserted through a small incision into the lipoma, and the contents of the lipoma are removed through the tube with suction. Follow these instructions at home:  Watch your lipoma for any changes.  Keep all follow-up visits as told by your health care provider. This is important. Contact a health care provider if:  Your lipoma becomes larger or hard.  Your lipoma becomes painful, red, or increasingly swollen. These could be signs of infection or a more serious condition. Get help right away if:  You develop tingling or numbness in an area near the lipoma. This could indicate that the lipoma is causing nerve damage. Summary  A lipoma is a noncancerous tumor that is made up of fat cells.  Most lipomas do not cause problems and do not require treatment.  If a lipoma is bigger or it causes problems, surgery may be done to remove the lipoma.  Contact a health care provider if your lipoma becomes larger or hard, or if it becomes painful, red, or increasingly swollen. Pain, redness, and swelling could be signs of infection or a more serious condition. This information is not intended to replace advice given to you by your health care provider. Make sure you discuss any questions you have with your health care provider. Document Revised: 02/06/2019 Document Reviewed: 02/06/2019 Elsevier Patient Education  2020 Elsevier Inc.  
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
## 2019-10-05 LAB — HIV ANTIBODY (ROUTINE TESTING W REFLEX): HIV Screen 4th Generation wRfx: NONREACTIVE

## 2019-10-05 LAB — CBC WITH DIFFERENTIAL/PLATELET
Basophils Absolute: 0.1 10*3/uL (ref 0.0–0.2)
Basos: 1 %
EOS (ABSOLUTE): 0.2 10*3/uL (ref 0.0–0.4)
Eos: 3 %
Hematocrit: 49.7 % (ref 37.5–51.0)
Hemoglobin: 17.3 g/dL (ref 13.0–17.7)
Immature Grans (Abs): 0 10*3/uL (ref 0.0–0.1)
Immature Granulocytes: 1 %
Lymphocytes Absolute: 2.7 10*3/uL (ref 0.7–3.1)
Lymphs: 38 %
MCH: 32 pg (ref 26.6–33.0)
MCHC: 34.8 g/dL (ref 31.5–35.7)
MCV: 92 fL (ref 79–97)
Monocytes Absolute: 0.4 10*3/uL (ref 0.1–0.9)
Monocytes: 6 %
Neutrophils Absolute: 3.7 10*3/uL (ref 1.4–7.0)
Neutrophils: 51 %
Platelets: 223 10*3/uL (ref 150–450)
RBC: 5.4 x10E6/uL (ref 4.14–5.80)
RDW: 13.7 % (ref 11.6–15.4)
WBC: 7.1 10*3/uL (ref 3.4–10.8)

## 2019-10-05 LAB — LIPID PANEL
Chol/HDL Ratio: 8.1 ratio — ABNORMAL HIGH (ref 0.0–5.0)
Cholesterol, Total: 218 mg/dL — ABNORMAL HIGH (ref 100–199)
HDL: 27 mg/dL — ABNORMAL LOW (ref >39)
LDL Chol Calc (NIH): 151 mg/dL — ABNORMAL HIGH (ref 0–99)
Triglycerides: 217 mg/dL — ABNORMAL HIGH (ref 0–149)
VLDL Cholesterol Cal: 40 mg/dL (ref 5–40)

## 2019-10-05 LAB — CMP14+EGFR
ALT: 42 IU/L (ref 0–44)
AST: 20 IU/L (ref 0–40)
Albumin/Globulin Ratio: 1.8 (ref 1.2–2.2)
Albumin: 4.4 g/dL (ref 4.0–5.0)
Alkaline Phosphatase: 144 IU/L — ABNORMAL HIGH (ref 39–117)
BUN/Creatinine Ratio: 13 (ref 9–20)
BUN: 15 mg/dL (ref 6–24)
Bilirubin Total: 0.3 mg/dL (ref 0.0–1.2)
CO2: 26 mmol/L (ref 20–29)
Calcium: 9.8 mg/dL (ref 8.7–10.2)
Chloride: 99 mmol/L (ref 96–106)
Creatinine, Ser: 1.17 mg/dL (ref 0.76–1.27)
GFR calc Af Amer: 85 mL/min/{1.73_m2} (ref >59)
GFR calc non Af Amer: 73 mL/min/{1.73_m2} (ref >59)
Globulin, Total: 2.5 g/dL (ref 1.5–4.5)
Glucose: 99 mg/dL (ref 65–99)
Potassium: 4.4 mmol/L (ref 3.5–5.2)
Sodium: 140 mmol/L (ref 134–144)
Total Protein: 6.9 g/dL (ref 6.0–8.5)

## 2019-10-05 LAB — TSH: TSH: 3.43 u[IU]/mL (ref 0.450–4.500)

## 2019-10-05 LAB — PSA: Prostate Specific Ag, Serum: 0.7 ng/mL (ref 0.0–4.0)

## 2019-10-09 MED ORDER — ATORVASTATIN CALCIUM 20 MG PO TABS: 20.0000 mg | ORAL_TABLET | Freq: Every day | ORAL | 2 refills | Status: DC

## 2019-10-09 NOTE — Addendum Note (Signed)
Addended by: Baldomero Lamy B on: 10/09/2019 09:14 AM   Modules accepted: Orders

## 2019-11-14 ENCOUNTER — Telehealth: Payer: Self-pay | Admitting: Family Medicine

## 2019-11-14 NOTE — Telephone Encounter (Signed)
  REFERRAL REQUEST Telephone Note 11/14/2019  What type of referral do you need? Orthopaedic-back &  Surgeon-cyst  Have you been seen at our office for this problem? yes (Advise that they may need an appointment with their PCP before a referral can be done)  Is there a particular doctor or location that you prefer? [facility]  Patient notified that referrals can take up to a week or longer to process. If they haven't heard anything within a week they should call back and speak with the referral department.   Gottschalk's pt.  Please call him bc it has been several weeks now.  @ request MRI?

## 2019-11-16 ENCOUNTER — Other Ambulatory Visit: Payer: Self-pay | Admitting: Family

## 2019-11-16 DIAGNOSIS — M549 Dorsalgia, unspecified: Secondary | ICD-10-CM

## 2019-11-16 DIAGNOSIS — L0291 Cutaneous abscess, unspecified: Secondary | ICD-10-CM

## 2019-11-16 NOTE — Progress Notes (Signed)
Referral placed.

## 2019-11-24 ENCOUNTER — Encounter: Payer: Self-pay | Admitting: Orthopedic Surgery

## 2019-11-24 ENCOUNTER — Other Ambulatory Visit: Payer: Self-pay | Admitting: Family

## 2019-11-24 DIAGNOSIS — M549 Dorsalgia, unspecified: Secondary | ICD-10-CM

## 2019-11-24 DIAGNOSIS — R2230 Localized swelling, mass and lump, unspecified upper limb: Secondary | ICD-10-CM

## 2020-01-05 ENCOUNTER — Encounter (INDEPENDENT_AMBULATORY_CARE_PROVIDER_SITE_OTHER): Payer: Self-pay

## 2020-01-05 ENCOUNTER — Telehealth: Payer: Self-pay

## 2020-01-05 ENCOUNTER — Other Ambulatory Visit: Payer: Self-pay

## 2020-01-05 ENCOUNTER — Ambulatory Visit (INDEPENDENT_AMBULATORY_CARE_PROVIDER_SITE_OTHER): Payer: Medicaid Other | Admitting: Family Medicine

## 2020-01-05 VITALS — BP 140/76 | HR 95 | Temp 96.9°F | Ht 70.0 in | Wt 288.0 lb

## 2020-01-05 DIAGNOSIS — Z23 Encounter for immunization: Secondary | ICD-10-CM | POA: Diagnosis not present

## 2020-01-05 DIAGNOSIS — E782 Mixed hyperlipidemia: Secondary | ICD-10-CM | POA: Diagnosis not present

## 2020-01-05 MED ORDER — ATORVASTATIN CALCIUM 20 MG PO TABS: 20.0000 mg | ORAL_TABLET | Freq: Every day | ORAL | 3 refills | Status: DC

## 2020-01-05 NOTE — Addendum Note (Signed)
Addended byCarrolyn Leigh on: 01/05/2020 03:46 PM   Modules accepted: Orders

## 2020-01-05 NOTE — Telephone Encounter (Signed)
Patients PCP called and was trying to get the scheduled for an appointment based off the referral that was sent over. She asked that we call the pt directly to schedule. Thank you

## 2020-01-05 NOTE — Progress Notes (Signed)
Subjective: CC: hyperlipidemia/hypertension HPI: Steve Romero is a 49 y.o. male presenting to clinic today for:  1.  Hypertension with hyperlipidemia.   Has been taking the Lipitor without difficulty.  No chest pain, shortness of breath, myalgia, jaundice or abdominal pain.  He has not heard back from the specialist about his wrist or back.  He wanted inquire about this today    Past Medical History:  Diagnosis Date  . Anxiety 10-2014  . Bipolar disorder ([place]) age 13  . COPD (chronic obstructive pulmonary disease) ([place]) 2016  . Depression age 60  . Enlarged prostate   . Hyperlipidemia 2016  . Hypertension 2013  . Schizoaffective disorder ([place]) 11-13-14  . Sleep apnea   . Thyroid disease 11-2014   Past Surgical History:  Procedure Laterality Date  . CARPAL TUNNEL RELEASE Left 02/21/2016   Procedure: CARPAL TUNNEL RELEASE;  Surgeon: Carole Civil, MD;  Location: [facility];  Service: Orthopedics;  Laterality: Left;  . HERNIA REPAIR Left 2002   groin  . INGUINAL HERNIA REPAIR Left 04/05/2017   Procedure: RECURRENT HERNIA REPAIR INGUINAL ADULT WITH MESH;  Surgeon: [name], MD;  Location: [facility];  Service: General;  Laterality: Left;   Social History   Socioeconomic History  . Marital status: Single    Spouse name: Not on file  . Number of children: Not on file  . Years of education: Not on file  . Highest education level: Not on file  Occupational History  . Not on file  Tobacco Use  . Smoking status: Current Every Day Smoker    Packs/day: 0.25    Years: 26.00    Pack years: 6.50    Types: Cigarettes  . Smokeless tobacco: Never Used  Vaping Use  . Vaping Use: Never used  Substance and Sexual Activity  . Alcohol use: No  . Drug use: No  . Sexual activity: Never    Birth control/protection: None  Other Topics Concern  . Not on file  Social History Narrative  . Not on file   Social Determinants of Health   Financial Resource Strain:   . Difficulty  of Paying Living Expenses:   Food Insecurity:   . Worried About Charity fundraiser in the Last Year:   . Arboriculturist in the Last Year:   Transportation Needs:   . Film/video editor (Medical):   Marland Kitchen Lack of Transportation (Non-Medical):   Physical Activity:   . Days of Exercise per Week:   . Minutes of Exercise per Session:   Stress:   . Feeling of Stress :   Social Connections:   . Frequency of Communication with Friends and Family:   . Frequency of Social Gatherings with Friends and Family:   . Attends Religious Services:   . Active Member of Clubs or Organizations:   . Attends Archivist Meetings:   Marland Kitchen Marital Status:   Intimate Partner Violence:   . Fear of Current or Ex-Partner:   . Emotionally Abused:   Marland Kitchen Physically Abused:   . Sexually Abused:    No outpatient medications have been marked as taking for the 01/05/20 encounter (Appointment) with [name], DO.   Family History  Problem Relation Age of Onset  . Diabetes Father   . Heart disease Father   . Heart disease Maternal Grandmother   . Stroke Maternal Grandfather    Allergies  Allergen Reactions  . Desipramine Nausea Only and Rash  Health Maintenance: TDap  ROS: Per HPI  Objective: Office vital signs reviewed. BP 140/76   Pulse 95   Temp (!) 96.9 F (36.1 C) (Temporal)   Ht 5' 10"  (1.778 m)   Wt 288 lb (130.6 kg)   SpO2 95%   BMI 41.32 kg/m   Physical Examination:  General: Awake, alert.  No acute distress HEENT: Normal. Sclera white, MMM Cardio: regular rate and rhythm, S1S2 heard, no murmurs appreciated Pulm: clear to auscultation bilaterally, no wheezes, rhonchi or rales; normal work of breathing on room air Extremities: warm, well perfused, No edema, cyanosis or clubbing; +2 pulses bilaterally MSK: antalgic/ stiff gait and normal station; soft tissue mass noted along the ulnar aspect of the right wrist.  No tenderness palpation. Psych: Mood stable, speech normal,  affect appropriate, pleasant and interactive.  Eye contact fair Depression screen [facility] 2/9 10/04/2019  Decreased Interest 0  Down, Depressed, Hopeless 0  PHQ - 2 Score 0  Altered sleeping 0  Tired, decreased energy 0  Change in appetite 0  Feeling bad or failure about yourself  0  Trouble concentrating 0  Moving slowly or fidgety/restless 0  Suicidal thoughts 0  PHQ-9 Score 0   GAD 7 : Generalized Anxiety Score 10/04/2019  Nervous, Anxious, on Edge 1  Control/stop worrying 0  Worry too much - different things 0  Trouble relaxing 0  Restless 0  Easily annoyed or irritable 0  Afraid - awful might happen 0  Total GAD 7 Score 1  Anxiety Difficulty Not difficult at all   Assessment/ Plan: 49 y.o. male   1. Mixed hyperlipidemia Stable with Lipitor.  He will need to come back in for lipid panel and liver function tests.  Patient was not fasting today. - atorvastatin (LIPITOR) 20 MG tablet; Take 1 tablet (20 mg total) by mouth daily.  Dispense: 90 tablet; Refill: 3 - Lipid panel; Future - CMP14+EGFR; Future  2. Need for prophylactic vaccination with tetanus-diphtheria (Td) Administered during today's visit.  [name], DO [place] [phone]

## 2020-01-05 NOTE — Patient Instructions (Signed)
Come back for fasting labs.

## 2020-01-09 NOTE — Telephone Encounter (Signed)
Noted, reopened referral.  We had tried to reach patient by phone and letter, unsuccessfully.  Will try to reach him again.

## 2020-02-05 ENCOUNTER — Ambulatory Visit: Payer: Medicaid Other | Admitting: Orthopedic Surgery

## 2020-02-05 ENCOUNTER — Encounter: Payer: Self-pay | Admitting: Orthopedic Surgery

## 2020-02-05 ENCOUNTER — Ambulatory Visit: Payer: Medicaid Other

## 2020-02-05 ENCOUNTER — Other Ambulatory Visit: Payer: Self-pay

## 2020-02-05 VITALS — BP 161/112 | HR 101 | Ht 70.0 in | Wt 283.0 lb

## 2020-02-05 DIAGNOSIS — M48061 Spinal stenosis, lumbar region without neurogenic claudication: Secondary | ICD-10-CM

## 2020-02-05 DIAGNOSIS — M545 Low back pain, unspecified: Secondary | ICD-10-CM

## 2020-02-05 DIAGNOSIS — R2231 Localized swelling, mass and lump, right upper limb: Secondary | ICD-10-CM

## 2020-02-05 MED ORDER — MELOXICAM 7.5 MG PO TABS: 7.5000 mg | ORAL_TABLET | Freq: Every day | ORAL | 5 refills | Status: DC

## 2020-02-05 MED ORDER — GABAPENTIN 100 MG PO CAPS: 100.0000 mg | ORAL_CAPSULE | Freq: Three times a day (TID) | ORAL | 2 refills | Status: DC

## 2020-02-05 NOTE — Progress Notes (Signed)
NEW PROBLEM//OFFICE VISIT  Chief Complaint  Patient presents with  . Wrist Problem    mass right wrist  . Back Pain    low back pain for a few years    49 year old male with 2 complaints 1 mass right wrist which he has had for 2 years or so initially started as a small mass in creased incised does not really cause pain and functional loss but it is getting bigger  Second complaint 4-5-year history of lower back pain which is worse in the last 4 to 5 months.  Patient reports a burning sensation above the small of his back with bilateral leg pain down to his knees   Review of Systems  Constitutional: Negative for chills, fever, malaise/fatigue and weight loss.  Gastrointestinal: Negative for constipation.       Denies loss bowel control   Genitourinary:       Denies urinary retention or los of bladder control      Past Medical History:  Diagnosis Date  . Anxiety 10-2014  . Bipolar disorder ([place]) age 69  . COPD (chronic obstructive pulmonary disease) ([place]) 2016  . Depression age 56  . Enlarged prostate   . Hyperlipidemia 2016  . Hypertension 2013  . Schizoaffective disorder ([place]) 11-13-14  . Sleep apnea   . Thyroid disease 11-2014    Past Surgical History:  Procedure Laterality Date  . CARPAL TUNNEL RELEASE Left 02/21/2016   Procedure: CARPAL TUNNEL RELEASE;  Surgeon: Carole Civil, MD;  Location: [facility];  Service: Orthopedics;  Laterality: Left;  . HERNIA REPAIR Left 2002   groin  . INGUINAL HERNIA REPAIR Left 04/05/2017   Procedure: RECURRENT HERNIA REPAIR INGUINAL ADULT WITH MESH;  Surgeon: [name], MD;  Location: [facility];  Service: General;  Laterality: Left;    Family History  Problem Relation Age of Onset  . Diabetes Father   . Heart disease Father   . Heart disease Maternal Grandmother   . Stroke Maternal Grandfather    Social History   Tobacco Use  . Smoking status: Current Every Day Smoker    Packs/day: 0.25    Years: 26.00    Pack years: 6.50     Types: Cigarettes  . Smokeless tobacco: Never Used  Vaping Use  . Vaping Use: Never used  Substance Use Topics  . Alcohol use: No  . Drug use: No    Allergies  Allergen Reactions  . Desipramine Nausea Only and Rash    Current Meds  Medication Sig  . atorvastatin (LIPITOR) 20 MG tablet Take 1 tablet (20 mg total) by mouth daily.  . risperiDONE (RISPERDAL) 1 MG tablet Take 1 mg by mouth at bedtime.  . traZODone (DESYREL) 150 MG tablet Take 2 tablets by mouth at bedtime.    BP (!) 161/112   Pulse 101   Ht 5\' 10"  (1.778 m)   Wt (!) 283 lb (128.4 kg)   BMI 40.61 kg/m   Physical Exam Constitutional:      General: He is not in acute distress.    Appearance: He is well-developed.  Cardiovascular:     Comments: No peripheral edema Skin:    General: Skin is warm and dry.  Neurological:     Mental Status: He is alert and oriented to person, place, and time.     Sensory: No sensory deficit.     Coordination: Coordination normal.     Gait: Gait normal.     Deep Tendon Reflexes: Reflexes are normal  and symmetric.     Ortho Exam  There is tenderness in the lower back primarily in the central area slightly to the right side as well.  Lower extremities gross motor strength is normal L2-S1  Sensory exam is normal distally    MEDICAL DECISION MAKING  A.  Encounter Diagnoses  Name Primary?  . Mass of wrist, right   . Lumbar pain   . Spinal stenosis of lumbar region without neurogenic claudication Yes    B. DATA ANALYSED:    IMAGING: Independent interpretation of images: X-ray shows loss of lumbar lordosis truncal asymmetry L4-5 spondylosis of the facet joint and then L3-4 degenerative disc with narrowing and anterior spur formation  Orders: Physical therapy, hand consultation referral  Outside records reviewed:   C. MANAGEMENT  Physical therapy for the back pain along with the following medications  Meds ordered this encounter  Medications  . meloxicam  (MOBIC) 7.5 MG tablet    Sig: Take 1 tablet (7.5 mg total) by mouth daily.    Dispense:  30 tablet    Refill:  5  . gabapentin (NEURONTIN) 100 MG capsule    Sig: Take 1 capsule (100 mg total) by mouth 3 (three) times daily.    Dispense:  90 capsule    Refill:  2   Referral to hand surgery to address the mass  [name], MD  02/05/2020 10:29 AM

## 2020-02-05 NOTE — Patient Instructions (Addendum)
Referral to hand for mass  PT for back    Spinal Stenosis  Spinal stenosis occurs when the open space (spinal canal) between the bones of your spine (vertebrae) narrows, putting pressure on the spinal cord or nerves. What are the causes? This condition is caused by areas of bone pushing into the central canals of your vertebrae. This condition may be present at birth (congenital), or it may be caused by:  Arthritic deterioration of your vertebrae (spinal degeneration). This usually starts around age 51.  Injury or trauma to the spine.  Tumors in the spine.  Calcium deposits in the spine. What are the signs or symptoms? Symptoms of this condition include:  Pain in the neck or back that is generally worse with activities, particularly when standing and walking.  Numbness, tingling, hot or cold sensations, weakness, or weariness in your legs.  Pain going up and down the leg (sciatica).  Frequent episodes of falling.  A foot-slapping gait that leads to muscle weakness. In more serious cases, you may develop:  Problemspassing stool or passing urine.  Difficulty having sex.  Loss of feeling in part or all of your leg. Symptoms may come on slowly and get worse over time. How is this diagnosed? This condition is diagnosed based on your medical history and a physical exam. Tests will also be done, such as:  MRI.  CT scan.  X-ray. How is this treated? Treatment for this condition often focuses on managing your pain and any other symptoms. Treatment may include:  Practicing good posture to lessen pressure on your nerves.  Exercising to strengthen muscles, build endurance, improve balance, and maintain good joint movement (range of motion).  Losing weight, if needed.  Taking medicines to reduce swelling, inflammation, or pain.  Assistive devices, such as a corset or brace. In some cases, surgery may be needed. The most common procedure is decompression laminectomy. This  is done to remove excess bone that puts pressure on your nerve roots. Follow these instructions at home: Managing pain, stiffness, and swelling  Do all exercises and stretches as told by your health care provider.  Practice good posture. If you were given a brace or a corset, wear it as told by your health care provider.  Do not do any activities that cause pain. Ask your health care provider what activities are safe for you.  Do not lift anything that is heavier than 10 lb (4.5 kg) or the limit that your health care provider tells you.  Maintain a healthy weight. Talk with your health care provider if you need help losing weight.  If directed, apply heat to the affected area as often as told by your health care provider. Use the heat source that your health care provider recommends, such as a moist heat pack or a heating pad. ? Place a towel between your skin and the heat source. ? Leave the heat on for 20-30 minutes. ? Remove the heat if your skin turns bright red. This is especially important if you are not able to feel pain, heat, or cold. You may have a greater risk of getting burned. General instructions  Take over-the-counter and prescription medicines only as told by your health care provider.  Do not use any products that contain nicotine or tobacco, such as cigarettes and e-cigarettes. If you need help quitting, ask your health care provider.  Eat a healthy diet. This includes plenty of fruits and vegetables, whole grains, and low-fat (lean) protein.  Keep all follow-up  visits as told by your health care provider. This is important. Contact a health care provider if:  Your symptoms do not get better or they get worse.  You have a fever. Get help right away if:  You have new or worse pain in your neck or upper back.  You have severe pain that cannot be controlled with medicines.  You are dizzy.  You have vision problems, blurred vision, or double vision.  You have a  severe headache that is worse when you stand.  You have nausea or you vomit.  You develop new or worse numbness or tingling in your back or legs.  You have pain, redness, swelling, or warmth in your arm or leg. Summary  Spinal stenosis occurs when the open space (spinal canal) between the bones of your spine (vertebrae) narrows. This narrowing puts pressure on the spinal cord or nerves.  Spinal stenosis can cause numbness, weakness, or pain in the neck, back, and legs.  This condition may be caused by a birth defect, arthritic deterioration of your vertebrae, injury, tumors, or calcium deposits.  This condition is usually diagnosed with MRIs, CT scans, and X-rays. This information is not intended to replace advice given to you by your health care provider. Make sure you discuss any questions you have with your health care provider. Document Revised: 06/04/2017 Document Reviewed: 05/27/2016 Elsevier Patient Education  2020 [employer].

## 2020-02-06 ENCOUNTER — Ambulatory Visit: Payer: Medicaid Other | Admitting: Physical Therapy

## 2020-02-14 DIAGNOSIS — R2231 Localized swelling, mass and lump, right upper limb: Secondary | ICD-10-CM | POA: Insufficient documentation

## 2020-03-12 ENCOUNTER — Other Ambulatory Visit: Payer: Self-pay | Admitting: Family Medicine

## 2020-03-12 DIAGNOSIS — E782 Mixed hyperlipidemia: Secondary | ICD-10-CM

## 2020-03-18 ENCOUNTER — Ambulatory Visit: Payer: Medicaid Other | Attending: Orthopedic Surgery | Admitting: Physical Therapy

## 2020-03-18 ENCOUNTER — Other Ambulatory Visit: Payer: Self-pay

## 2020-03-18 DIAGNOSIS — G8929 Other chronic pain: Secondary | ICD-10-CM | POA: Insufficient documentation

## 2020-03-18 DIAGNOSIS — M545 Low back pain: Secondary | ICD-10-CM | POA: Diagnosis not present
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
## 2020-03-19 ENCOUNTER — Telehealth: Payer: Self-pay | Admitting: Family Medicine

## 2020-03-19 NOTE — Telephone Encounter (Signed)
  Prescription Request  03/19/2020  What is the name of the medication or equipment? Pt is having MRI on the 27th and he needs something called in for claustrophobia  Have you contacted your pharmacy to request a refill? (if applicable) no  Which pharmacy would you like this sent to? [facility]    Patient notified that their request is being sent to the clinical staff for review and that they should receive a response within 2 business days.

## 2020-04-01 ENCOUNTER — Ambulatory Visit: Payer: Medicaid Other | Admitting: Orthopedic Surgery

## 2020-04-08 ENCOUNTER — Ambulatory Visit: Payer: Medicaid Other | Admitting: Family Medicine

## 2020-04-09 ENCOUNTER — Other Ambulatory Visit: Payer: Self-pay | Admitting: Orthopedic Surgery

## 2020-04-18 ENCOUNTER — Other Ambulatory Visit: Payer: Self-pay

## 2020-04-18 ENCOUNTER — Ambulatory Visit: Payer: Medicaid Other | Admitting: Nurse Practitioner

## 2020-04-18 ENCOUNTER — Encounter: Payer: Self-pay | Admitting: Nurse Practitioner

## 2020-04-18 DIAGNOSIS — I10 Essential (primary) hypertension: Secondary | ICD-10-CM

## 2020-04-18 NOTE — Assessment & Plan Note (Signed)
Patient is following up for blood pressure. patient is scheduled for right wrist surgery removal of lipoma.  Patient's blood pressure is within normal limits.  Patient was concerned about elevated blood pressure before surgery.  Systolic blood pressure is 540-086 and diastolic 76-19.  Advised patient to continue medication with elevated blood pressures.  Advised patient not to discontinue medication without medical advice.  Printed handouts given

## 2020-04-18 NOTE — Patient Instructions (Signed)
Lipoma  A lipoma is a noncancerous (benign) tumor that is made up of fat cells. This is a very common type of soft-tissue growth. Lipomas are usually found under the skin (subcutaneous). They may occur in any tissue of the body that contains fat. Common areas for lipomas to appear include the back, arms, shoulders, buttocks, and thighs. Lipomas grow slowly, and they are usually painless. Most lipomas do not cause problems and do not require treatment. What are the causes? The cause of this condition is not known. What increases the risk? You are more likely to develop this condition if:  You are 29-82 years old.  You have a family history of lipomas. What are the signs or symptoms? A lipoma usually appears as a small, round bump under the skin. In most cases, the lump will:  Feel soft or rubbery.  Not cause pain or other symptoms. However, if a lipoma is located in an area where it pushes on nerves, it can become painful or cause other symptoms. How is this diagnosed? A lipoma can usually be diagnosed with a physical exam. You may also have tests to confirm the diagnosis and to rule out other conditions. Tests may include:  Imaging tests, such as a CT scan or an MRI.  Removal of a tissue sample to be looked at under a microscope (biopsy). How is this treated? Treatment for this condition depends on the size of the lipoma and whether it is causing any symptoms.  For small lipomas that are not causing problems, no treatment is needed.  If a lipoma is bigger or it causes problems, surgery may be done to remove the lipoma. Lipomas can also be removed to improve appearance. Most often, the procedure is done after applying a medicine that numbs the area (local anesthetic).  Liposuction may be done to reduce the size of the lipoma before it is removed through surgery, or it may be done to remove the lipoma. Lipomas are removed with this method in order to limit incision size and scarring. A  liposuction tube is inserted through a small incision into the lipoma, and the contents of the lipoma are removed through the tube with suction. Follow these instructions at home:  Watch your lipoma for any changes.  Keep all follow-up visits as told by your health care provider. This is important. Contact a health care provider if:  Your lipoma becomes larger or hard.  Your lipoma becomes painful, red, or increasingly swollen. These could be signs of infection or a more serious condition. Get help right away if:  You develop tingling or numbness in an area near the lipoma. This could indicate that the lipoma is causing nerve damage. Summary  A lipoma is a noncancerous tumor that is made up of fat cells.  Most lipomas do not cause problems and do not require treatment.  If a lipoma is bigger or it causes problems, surgery may be done to remove the lipoma.  Contact a health care provider if your lipoma becomes larger or hard, or if it becomes painful, red, or increasingly swollen. Pain, redness, and swelling could be signs of infection or a more serious condition. This information is not intended to replace advice given to you by your health care provider. Make sure you discuss any questions you have with your health care provider. Document Revised: 02/06/2019 Document Reviewed: 02/06/2019 Elsevier Patient Education  [place]. Blood Pressure Record Sheet To take your blood pressure, you will need a blood  pressure machine. You can buy a blood pressure machine (blood pressure monitor) at your clinic, drug store, or online. When choosing one, consider:  An automatic monitor that has an arm cuff.  A cuff that wraps snugly around your upper arm. You should be able to fit only one finger between your arm and the cuff.  A device that stores blood pressure reading results.  Do not choose a monitor that measures your blood pressure from your wrist or finger. Follow your health  care provider's instructions for how to take your blood pressure. To use this form:  Get one reading in the morning (a.m.) before you take any medicines.  Get one reading in the evening (p.m.) before supper.  Take at least 2 readings with each blood pressure check. This makes sure the results are correct. Wait 1-2 minutes between measurements.  Write down the results in the spaces on this form.  Repeat this once a week, or as told by your health care provider.  Make a follow-up appointment with your health care provider to discuss the results. Blood pressure log Date: _______________________  a.m. _____________________(1st reading) _____________________(2nd reading)  p.m. _____________________(1st reading) _____________________(2nd reading) Date: _______________________  a.m. _____________________(1st reading) _____________________(2nd reading)  p.m. _____________________(1st reading) _____________________(2nd reading) Date: _______________________  a.m. _____________________(1st reading) _____________________(2nd reading)  p.m. _____________________(1st reading) _____________________(2nd reading) Date: _______________________  a.m. _____________________(1st reading) _____________________(2nd reading)  p.m. _____________________(1st reading) _____________________(2nd reading) Date: _______________________  a.m. _____________________(1st reading) _____________________(2nd reading)  p.m. _____________________(1st reading) _____________________(2nd reading) This information is not intended to replace advice given to you by your health care provider. Make sure you discuss any questions you have with your health care provider. Document Revised: 08/20/2017 Document Reviewed: 06/22/2017 Elsevier Patient Education  Steve Romero.

## 2020-04-18 NOTE — Progress Notes (Signed)
Established Patient Office Visit  Subjective:  Patient ID: Steve Romero, male    DOB: 1970/07/09  Age: 49 y.o. MRN: [id number]  CC:  Chief Complaint  Patient presents with  . Surgical clearance    HPI Steve Romero  presents for follow up of hypertension. Patient was diagnosed in 04/22/2015 The patient is tolerating the medication well without side effects. Compliance with treatment has been good; including taking medication as directed , maintains a healthy diet and regular exercise regimen , and following up as directed.  Current medication.  Patient reports stopping taking his blood pressure medication because his blood pressure was well controlled.  Past Medical History:  Diagnosis Date  . Anxiety 10-2014  . Bipolar disorder ([place]) age 21  . COPD (chronic obstructive pulmonary disease) ([place]) 2016  . Depression age 53  . Enlarged prostate   . Hyperlipidemia 2016  . Hypertension 2013  . Schizoaffective disorder ([place]) 11-13-14  . Sleep apnea   . Thyroid disease 11-2014    Past Surgical History:  Procedure Laterality Date  . CARPAL TUNNEL RELEASE Left 02/21/2016   Procedure: CARPAL TUNNEL RELEASE;  Surgeon: Carole Civil, MD;  Location: [facility];  Service: Orthopedics;  Laterality: Left;  . HERNIA REPAIR Left 2002   groin  . INGUINAL HERNIA REPAIR Left 04/05/2017   Procedure: RECURRENT HERNIA REPAIR INGUINAL ADULT WITH MESH;  Surgeon: [name], MD;  Location: [facility];  Service: General;  Laterality: Left;    Family History  Problem Relation Age of Onset  . Diabetes Father   . Heart disease Father   . Heart disease Maternal Grandmother   . Stroke Maternal Grandfather     Social History   Socioeconomic History  . Marital status: Single    Spouse name: Not on file  . Number of children: Not on file  . Years of education: Not on file  . Highest education level: Not on file  Occupational History  . Not on file  Tobacco Use  . Smoking status: Current Every  Day Smoker    Packs/day: 0.25    Years: 26.00    Pack years: 6.50    Types: Cigarettes  . Smokeless tobacco: Never Used  Vaping Use  . Vaping Use: Never used  Substance and Sexual Activity  . Alcohol use: No  . Drug use: No  . Sexual activity: Never    Birth control/protection: None  Other Topics Concern  . Not on file  Social History Narrative  . Not on file   Social Determinants of Health   Financial Resource Strain:   . Difficulty of Paying Living Expenses: Not on file  Food Insecurity:   . Worried About Charity fundraiser in the Last Year: Not on file  . Ran Out of Food in the Last Year: Not on file  Transportation Needs:   . Lack of Transportation (Medical): Not on file  . Lack of Transportation (Non-Medical): Not on file  Physical Activity:   . Days of Exercise per Week: Not on file  . Minutes of Exercise per Session: Not on file  Stress:   . Feeling of Stress : Not on file  Social Connections:   . Frequency of Communication with Friends and Family: Not on file  . Frequency of Social Gatherings with Friends and Family: Not on file  . Attends Religious Services: Not on file  . Active Member of Clubs or Organizations: Not on file  . Attends Archivist Meetings: Not  on file  . Marital Status: Not on file  Intimate Partner Violence:   . Fear of Current or Ex-Partner: Not on file  . Emotionally Abused: Not on file  . Physically Abused: Not on file  . Sexually Abused: Not on file    Outpatient Medications Prior to Visit  Medication Sig Dispense Refill  . gabapentin (NEURONTIN) 100 MG capsule Take 1 capsule (100 mg total) by mouth 3 (three) times daily. 90 capsule 2  . meloxicam (MOBIC) 7.5 MG tablet Take 1 tablet (7.5 mg total) by mouth daily. 30 tablet 5  . risperiDONE (RISPERDAL) 1 MG tablet Take 1 mg by mouth at bedtime.    . traZODone (DESYREL) 150 MG tablet Take 2 tablets by mouth at bedtime.    Marland Kitchen atorvastatin (LIPITOR) 20 MG tablet Take 1 tablet  by mouth once daily (Patient not taking: Reported on 04/18/2020) 30 tablet 0   No facility-administered medications prior to visit.    Allergies  Allergen Reactions  . Desipramine Nausea Only and Rash    ROS Review of Systems  Constitutional: Negative.   HENT: Negative.   Eyes: Negative.   Respiratory: Negative.   Genitourinary: Negative.   Skin:       Right wrist lipoma  Neurological: Negative for weakness, light-headedness, numbness and headaches.  All other systems reviewed and are negative.     Objective:    Physical Exam Vitals reviewed.  Constitutional:      Appearance: Normal appearance.  HENT:     Head: Normocephalic.     Right Ear: External ear normal.     Left Ear: External ear normal.  Eyes:     Conjunctiva/sclera: Conjunctivae normal.  Cardiovascular:     Rate and Rhythm: Normal rate and regular rhythm.  Pulmonary:     Effort: Pulmonary effort is normal.     Breath sounds: Normal breath sounds.  Skin:    Findings: Erythema present.     Comments: Right wrist lipoma  Neurological:     Mental Status: He is alert and oriented to person, place, and time.     BP 134/75   Pulse 96   Temp 98.3 F (36.8 C) (Temporal)   Ht 5\' 10"  (1.778 m)   Wt 278 lb 6.4 oz (126.3 kg)   SpO2 94%   BMI 39.95 kg/m  Wt Readings from Last 3 Encounters:  04/18/20 278 lb 6.4 oz (126.3 kg)  02/05/20 (!) 283 lb (128.4 kg)  01/05/20 288 lb (130.6 kg)     Health Maintenance Due  Topic Date Due  . Hepatitis C Screening  Never done    There are no preventive care reminders to display for this patient.  Lab Results  Component Value Date   TSH 3.430 10/04/2019   Lab Results  Component Value Date   WBC 7.1 10/04/2019   HGB 17.3 10/04/2019   HCT 49.7 10/04/2019   MCV 92 10/04/2019   PLT 223 10/04/2019   Lab Results  Component Value Date   NA 140 10/04/2019   K 4.4 10/04/2019   CO2 26 10/04/2019   GLUCOSE 99 10/04/2019   BUN 15 10/04/2019   CREATININE 1.17  10/04/2019   BILITOT 0.3 10/04/2019   ALKPHOS 144 (H) 10/04/2019   AST 20 10/04/2019   ALT 42 10/04/2019   PROT 6.9 10/04/2019   ALBUMIN 4.4 10/04/2019   CALCIUM 9.8 10/04/2019   ANIONGAP 11 04/08/2017   Lab Results  Component Value Date   CHOL 218 (H) 10/04/2019  Lab Results  Component Value Date   HDL 27 (L) 10/04/2019   Lab Results  Component Value Date   LDLCALC 151 (H) 10/04/2019   Lab Results  Component Value Date   TRIG 217 (H) 10/04/2019   Lab Results  Component Value Date   CHOLHDL 8.1 (H) 10/04/2019   Lab Results  Component Value Date   HGBA1C 6.0 10/04/2019      Assessment & Plan:   Problem List Items Addressed This Visit      Cardiovascular and Mediastinum   HTN (hypertension)    Patient is following up for blood pressure. patient is scheduled for right wrist surgery removal of lipoma.  Patient's blood pressure is within normal limits.  Patient was concerned about elevated blood pressure before surgery.  Systolic blood pressure is 941-740 and diastolic 81-44.  Advised patient to continue medication with elevated blood pressures.  Advised patient not to discontinue medication without medical advice.  Printed handouts given         No orders of the defined types were placed in this encounter.   Follow-up: Return if symptoms worsen or fail to improve.    Ivy Lynn, NP

## 2020-04-22 ENCOUNTER — Encounter: Payer: Self-pay | Admitting: Orthopedic Surgery

## 2020-04-22 ENCOUNTER — Other Ambulatory Visit: Payer: Self-pay

## 2020-04-22 ENCOUNTER — Ambulatory Visit (INDEPENDENT_AMBULATORY_CARE_PROVIDER_SITE_OTHER): Payer: Medicaid Other | Admitting: Orthopedic Surgery

## 2020-04-22 ENCOUNTER — Encounter (HOSPITAL_BASED_OUTPATIENT_CLINIC_OR_DEPARTMENT_OTHER): Payer: Self-pay | Admitting: Orthopedic Surgery

## 2020-04-22 VITALS — BP 156/104 | HR 75 | Ht 70.0 in | Wt 274.0 lb

## 2020-04-22 DIAGNOSIS — R2231 Localized swelling, mass and lump, right upper limb: Secondary | ICD-10-CM | POA: Diagnosis not present

## 2020-04-22 DIAGNOSIS — M48061 Spinal stenosis, lumbar region without neurogenic claudication: Secondary | ICD-10-CM

## 2020-04-22 NOTE — Patient Instructions (Signed)
Call after 8 physical therapy appointments to make appointment for follow up

## 2020-04-22 NOTE — Progress Notes (Signed)
Chief Complaint  Patient presents with  . Back Pain    follow up for PT only had 1 session.     Jadyn was seen today for back pain.  Diagnoses and all orders for this visit:  Spinal stenosis of lumbar region without neurogenic claudication  Mass of wrist, right   Plan: Patient will have surgery on his wrist with Dr. Fredna Dow  As far as his back goes he said 1 therapy visits still has similar symptoms  Recommend he call us back when he has had 8 visits of therapy that way we can see if he needs an MRI or not  49 year old male with 2 complaints 1 mass right wrist which he has had for 2 years or so initially started as a small mass in creased incised does not really cause pain and functional loss but it is getting bigger  Second complaint 4-5-year history of lower back pain which is worse in the last 4 to 5 months.  Patient reports a burning sensation above the small of his back with bilateral leg pain down to his knees

## 2020-04-25 ENCOUNTER — Other Ambulatory Visit (HOSPITAL_COMMUNITY)
Admission: RE | Admit: 2020-04-25 | Discharge: 2020-04-25 | Disposition: A | Discharge status: Home / Self Care | Payer: Medicaid Other | Source: Ambulatory Visit | Attending: Orthopedic Surgery | Admitting: Orthopedic Surgery

## 2020-04-25 DIAGNOSIS — Z01812 Encounter for preprocedural laboratory examination: Secondary | ICD-10-CM | POA: Diagnosis present

## 2020-04-25 DIAGNOSIS — Z20822 Contact with and (suspected) exposure to covid-19: Secondary | ICD-10-CM | POA: Insufficient documentation

## 2020-04-25 LAB — SARS CORONAVIRUS 2 (TAT 6-24 HRS): SARS Coronavirus 2: NEGATIVE

## 2020-04-29 ENCOUNTER — Encounter (HOSPITAL_BASED_OUTPATIENT_CLINIC_OR_DEPARTMENT_OTHER): Payer: Self-pay | Admitting: Orthopedic Surgery

## 2020-04-29 ENCOUNTER — Other Ambulatory Visit: Payer: Self-pay

## 2020-04-29 ENCOUNTER — Encounter (HOSPITAL_BASED_OUTPATIENT_CLINIC_OR_DEPARTMENT_OTHER)
Admission: RE | Disposition: A | Discharge status: Home / Self Care | Payer: Self-pay | Source: Home / Self Care | Attending: Orthopedic Surgery

## 2020-04-29 ENCOUNTER — Ambulatory Visit (HOSPITAL_BASED_OUTPATIENT_CLINIC_OR_DEPARTMENT_OTHER): Payer: Medicaid Other | Admitting: Certified Registered"

## 2020-04-29 ENCOUNTER — Ambulatory Visit (HOSPITAL_BASED_OUTPATIENT_CLINIC_OR_DEPARTMENT_OTHER)
Admission: RE | Admit: 2020-04-29 | Discharge: 2020-04-29 | Disposition: A | Discharge status: Home / Self Care | Payer: Medicaid Other | Attending: Orthopedic Surgery | Admitting: Orthopedic Surgery

## 2020-04-29 DIAGNOSIS — D1721 Benign lipomatous neoplasm of skin and subcutaneous tissue of right arm: Secondary | ICD-10-CM | POA: Insufficient documentation

## 2020-04-29 DIAGNOSIS — Z888 Allergy status to other drugs, medicaments and biological substances status: Secondary | ICD-10-CM | POA: Insufficient documentation

## 2020-04-29 HISTORY — PX: MASS EXCISION: SHX2000

## 2020-04-29 SURGERY — EXCISION MASS
Anesthesia: Regional | Site: Wrist | Laterality: Right

## 2020-04-29 MED ORDER — HYDROMORPHONE HCL 1 MG/ML IJ SOLN: 0.2500 mg | INTRAMUSCULAR | Status: DC | PRN

## 2020-04-29 MED ORDER — PROMETHAZINE HCL 25 MG/ML IJ SOLN: 6.2500 mg | INTRAMUSCULAR | Status: DC | PRN

## 2020-04-29 MED ORDER — OXYCODONE HCL 5 MG PO TABS: 5.0000 mg | ORAL_TABLET | Freq: Once | ORAL | Status: DC | PRN

## 2020-04-29 MED ORDER — PROPOFOL 10 MG/ML IV BOLUS
INTRAVENOUS | Status: DC | PRN
  Administered 2020-04-29: 200 mg via INTRAVENOUS
  Administered 2020-04-29: 50 mg via INTRAVENOUS

## 2020-04-29 MED ORDER — HYDROCODONE-ACETAMINOPHEN 5-325 MG PO TABS: ORAL_TABLET | ORAL | 0 refills | Status: DC

## 2020-04-29 MED ORDER — BUPIVACAINE HCL (PF) 0.25 % IJ SOLN
INTRAMUSCULAR | Status: AC
  Filled 2020-04-29: qty 30

## 2020-04-29 MED ORDER — LIDOCAINE 2% (20 MG/ML) 5 ML SYRINGE
INTRAMUSCULAR | Status: AC
  Filled 2020-04-29: qty 5

## 2020-04-29 MED ORDER — CEFAZOLIN SODIUM-DEXTROSE 1-4 GM/50ML-% IV SOLN
INTRAVENOUS | Status: AC
  Filled 2020-04-29: qty 50

## 2020-04-29 MED ORDER — DIPHENHYDRAMINE HCL 50 MG/ML IJ SOLN
INTRAMUSCULAR | Status: AC
  Filled 2020-04-29: qty 1

## 2020-04-29 MED ORDER — BUPIVACAINE HCL (PF) 0.25 % IJ SOLN
INTRAMUSCULAR | Status: DC | PRN
  Administered 2020-04-29: 8 mL

## 2020-04-29 MED ORDER — ONDANSETRON HCL 4 MG/2ML IJ SOLN
INTRAMUSCULAR | Status: AC
  Filled 2020-04-29: qty 2

## 2020-04-29 MED ORDER — CEFAZOLIN SODIUM-DEXTROSE 2-4 GM/100ML-% IV SOLN
2.0000 g | INTRAVENOUS | Status: AC
  Administered 2020-04-29: 3 g via INTRAVENOUS

## 2020-04-29 MED ORDER — LACTATED RINGERS IV SOLN: INTRAVENOUS | Status: DC

## 2020-04-29 MED ORDER — LIDOCAINE HCL (CARDIAC) PF 100 MG/5ML IV SOSY
PREFILLED_SYRINGE | INTRAVENOUS | Status: DC | PRN
  Administered 2020-04-29: 60 mg via INTRAVENOUS

## 2020-04-29 MED ORDER — OXYCODONE HCL 5 MG/5ML PO SOLN: 5.0000 mg | Freq: Once | ORAL | Status: DC | PRN

## 2020-04-29 MED ORDER — MIDAZOLAM HCL 2 MG/2ML IJ SOLN
INTRAMUSCULAR | Status: AC
  Filled 2020-04-29: qty 2

## 2020-04-29 MED ORDER — CEFAZOLIN SODIUM-DEXTROSE 2-4 GM/100ML-% IV SOLN
INTRAVENOUS | Status: AC
  Filled 2020-04-29: qty 100

## 2020-04-29 MED ORDER — EPHEDRINE 5 MG/ML INJ
INTRAVENOUS | Status: AC
  Filled 2020-04-29: qty 10

## 2020-04-29 MED ORDER — FENTANYL CITRATE (PF) 100 MCG/2ML IJ SOLN
INTRAMUSCULAR | Status: AC
  Filled 2020-04-29: qty 2

## 2020-04-29 MED ORDER — DEXAMETHASONE SODIUM PHOSPHATE 10 MG/ML IJ SOLN
INTRAMUSCULAR | Status: DC | PRN
  Administered 2020-04-29: 10 mg via INTRAVENOUS

## 2020-04-29 MED ORDER — MIDAZOLAM HCL 5 MG/5ML IJ SOLN
INTRAMUSCULAR | Status: DC | PRN
  Administered 2020-04-29: 2 mg via INTRAVENOUS

## 2020-04-29 MED ORDER — SUCCINYLCHOLINE CHLORIDE 200 MG/10ML IV SOSY
PREFILLED_SYRINGE | INTRAVENOUS | Status: AC
  Filled 2020-04-29: qty 10

## 2020-04-29 MED ORDER — PHENYLEPHRINE 40 MCG/ML (10ML) SYRINGE FOR IV PUSH (FOR BLOOD PRESSURE SUPPORT)
PREFILLED_SYRINGE | INTRAVENOUS | Status: AC
  Filled 2020-04-29: qty 10

## 2020-04-29 MED ORDER — FENTANYL CITRATE (PF) 100 MCG/2ML IJ SOLN
INTRAMUSCULAR | Status: DC | PRN
  Administered 2020-04-29: 50 ug via INTRAVENOUS
  Administered 2020-04-29 (×2): 25 ug via INTRAVENOUS

## 2020-04-29 SURGICAL SUPPLY — 58 items
APL PRP STRL LF DISP 70% ISPRP (MISCELLANEOUS) ×1
APL SKNCLS STERI-STRIP NONHPOA (GAUZE/BANDAGES/DRESSINGS)
BENZOIN TINCTURE PRP APPL 2/3 (GAUZE/BANDAGES/DRESSINGS) IMPLANT
BLADE MINI RND TIP GREEN BEAV (BLADE) IMPLANT
BLADE SURG 15 STRL LF DISP TIS (BLADE) ×2 IMPLANT
BLADE SURG 15 STRL SS (BLADE) ×4
BNDG CMPR 9X4 STRL LF SNTH (GAUZE/BANDAGES/DRESSINGS) ×1
BNDG COHESIVE 1X5 TAN STRL LF (GAUZE/BANDAGES/DRESSINGS) IMPLANT
BNDG COHESIVE 2X5 TAN STRL LF (GAUZE/BANDAGES/DRESSINGS) IMPLANT
BNDG CONFORM 2 STRL LF (GAUZE/BANDAGES/DRESSINGS) IMPLANT
BNDG ELASTIC 2X5.8 VLCR STR LF (GAUZE/BANDAGES/DRESSINGS) IMPLANT
BNDG ELASTIC 3X5.8 VLCR STR LF (GAUZE/BANDAGES/DRESSINGS) ×2 IMPLANT
BNDG ESMARK 4X9 LF (GAUZE/BANDAGES/DRESSINGS) ×2 IMPLANT
BNDG GAUZE 1X2.1 STRL (MISCELLANEOUS) IMPLANT
BNDG GAUZE ELAST 4 BULKY (GAUZE/BANDAGES/DRESSINGS) ×2 IMPLANT
BNDG PLASTER X FAST 3X3 WHT LF (CAST SUPPLIES) IMPLANT
BNDG PLSTR 9X3 FST ST WHT (CAST SUPPLIES)
CHLORAPREP W/TINT 26 (MISCELLANEOUS) ×2 IMPLANT
CORD BIPOLAR FORCEPS 12FT (ELECTRODE) ×2 IMPLANT
COVER BACK TABLE 60X90IN (DRAPES) ×2 IMPLANT
COVER MAYO STAND STRL (DRAPES) ×2 IMPLANT
COVER WAND RF STERILE (DRAPES) IMPLANT
CUFF TOURN SGL QUICK 18X4 (TOURNIQUET CUFF) ×2 IMPLANT
DRAPE EXTREMITY T 121X128X90 (DISPOSABLE) ×2 IMPLANT
DRAPE SURG 17X23 STRL (DRAPES) ×2 IMPLANT
DRSG PAD ABDOMINAL 8X10 ST (GAUZE/BANDAGES/DRESSINGS) ×2 IMPLANT
GAUZE SPONGE 4X4 12PLY STRL (GAUZE/BANDAGES/DRESSINGS) ×2 IMPLANT
GAUZE XEROFORM 1X8 LF (GAUZE/BANDAGES/DRESSINGS) ×2 IMPLANT
GLOVE BIO SURGEON STRL SZ7.5 (GLOVE) ×2 IMPLANT
GLOVE BIOGEL PI IND STRL 8 (GLOVE) ×2 IMPLANT
GLOVE BIOGEL PI INDICATOR 8 (GLOVE) ×2
GLOVE SURG SS PI 7.5 STRL IVOR (GLOVE) ×2 IMPLANT
GLOVE SURG SYN 7.5  E (GLOVE) ×2
GLOVE SURG SYN 7.5 E (GLOVE) ×1 IMPLANT
GOWN STRL REUS W/ TWL LRG LVL3 (GOWN DISPOSABLE) IMPLANT
GOWN STRL REUS W/ TWL XL LVL3 (GOWN DISPOSABLE) ×1 IMPLANT
GOWN STRL REUS W/TWL LRG LVL3 (GOWN DISPOSABLE)
GOWN STRL REUS W/TWL XL LVL3 (GOWN DISPOSABLE) ×4 IMPLANT
NEEDLE HYPO 25X1 1.5 SAFETY (NEEDLE) ×2 IMPLANT
NS IRRIG 1000ML POUR BTL (IV SOLUTION) ×2 IMPLANT
PACK BASIN DAY SURGERY FS (CUSTOM PROCEDURE TRAY) ×2 IMPLANT
PAD CAST 3X4 CTTN HI CHSV (CAST SUPPLIES) IMPLANT
PAD CAST 4YDX4 CTTN HI CHSV (CAST SUPPLIES) ×1 IMPLANT
PADDING CAST ABS 4INX4YD NS (CAST SUPPLIES)
PADDING CAST ABS COTTON 4X4 ST (CAST SUPPLIES) IMPLANT
PADDING CAST COTTON 3X4 STRL (CAST SUPPLIES)
PADDING CAST COTTON 4X4 STRL (CAST SUPPLIES) ×2
STOCKINETTE 4X48 STRL (DRAPES) ×2 IMPLANT
STRIP CLOSURE SKIN 1/2X4 (GAUZE/BANDAGES/DRESSINGS) IMPLANT
SUT ETHILON 3 0 PS 1 (SUTURE) IMPLANT
SUT ETHILON 4 0 PS 2 18 (SUTURE) ×2 IMPLANT
SUT ETHILON 5 0 P 3 18 (SUTURE)
SUT NYLON ETHILON 5-0 P-3 1X18 (SUTURE) IMPLANT
SUT VIC AB 4-0 P2 18 (SUTURE) IMPLANT
SYR BULB EAR ULCER 3OZ GRN STR (SYRINGE) ×2 IMPLANT
SYR CONTROL 10ML LL (SYRINGE) ×2 IMPLANT
TOWEL GREEN STERILE FF (TOWEL DISPOSABLE) ×2 IMPLANT
UNDERPAD 30X36 HEAVY ABSORB (UNDERPADS AND DIAPERS) ×2 IMPLANT

## 2020-04-29 NOTE — Anesthesia Preprocedure Evaluation (Addendum)
Anesthesia Evaluation  Patient identified by MRN, date of birth, ID band Patient awake    Reviewed: Allergy & Precautions, NPO status , Patient's Chart, lab work & pertinent test results  Airway Mallampati: I  TM Distance: >3 FB Neck ROM: Full    Dental  (+) Edentulous Upper, Edentulous Lower   Pulmonary sleep apnea , COPD, Current Smoker and Patient abstained from smoking.,    breath sounds clear to auscultation       Cardiovascular hypertension, Pt. on medications negative cardio ROS   Rhythm:Regular Rate:Normal     Neuro/Psych PSYCHIATRIC DISORDERS ( Schizoaffective disorder ) Anxiety Depression Bipolar Disorder    GI/Hepatic negative GI ROS,   Endo/Other    Renal/GU      Musculoskeletal   Abdominal   Peds  Hematology   Anesthesia Other Findings   Reproductive/Obstetrics                             Anesthesia Physical  Anesthesia Plan  ASA: III  Anesthesia Plan: General   Post-op Pain Management:    Induction: Intravenous  PONV Risk Score and Plan: 1 and Treatment may vary due to age or medical condition and Ondansetron  Airway Management Planned: LMA  Additional Equipment:   Intra-op Plan:   Post-operative Plan: Extubation in OR  Informed Consent: I have reviewed the patients History and Physical, chart, labs and discussed the procedure including the risks, benefits and alternatives for the proposed anesthesia with the patient or authorized representative who has indicated his/her understanding and acceptance.       Plan Discussed with:   Anesthesia Plan Comments:        Anesthesia Quick Evaluation

## 2020-04-29 NOTE — H&P (Signed)
Steve Romero is an 49 y.o. male.   Chief Complaint: wrist mass HPI: 49 yo male with mass right wrist.  It is bothersome to him.  He wishes to have it removed.  MRI consistent with lipoma.  Allergies:  Allergies  Allergen Reactions  . Desipramine Nausea Only and Rash    Past Medical History:  Diagnosis Date  . Anxiety 10-2014  . Bipolar disorder ([place]) age 67  . COPD (chronic obstructive pulmonary disease) ([place]) 2016  . Depression age 19  . Enlarged prostate   . Hyperlipidemia 2016  . Hypertension 2013  . Schizoaffective disorder ([place]) 11-13-14  . Sleep apnea   . Thyroid disease 11-2014    Past Surgical History:  Procedure Laterality Date  . CARPAL TUNNEL RELEASE Left 02/21/2016   Procedure: CARPAL TUNNEL RELEASE;  Surgeon: Carole Civil, MD;  Location: [facility];  Service: Orthopedics;  Laterality: Left;  . HERNIA REPAIR Left 2002   groin  . INGUINAL HERNIA REPAIR Left 04/05/2017   Procedure: RECURRENT HERNIA REPAIR INGUINAL ADULT WITH MESH;  Surgeon: [name], MD;  Location: [facility];  Service: General;  Laterality: Left;    Family History: Family History  Problem Relation Age of Onset  . Diabetes Father   . Heart disease Father   . Heart disease Maternal Grandmother   . Stroke Maternal Grandfather     Social History:   reports that he has been smoking cigarettes. He has a 6.50 pack-year smoking history. He has never used smokeless tobacco. He reports that he does not drink alcohol and does not use drugs.  Medications: Medications Prior to Admission  Medication Sig Dispense Refill  . gabapentin (NEURONTIN) 100 MG capsule Take 1 capsule (100 mg total) by mouth 3 (three) times daily. 90 capsule 2  . meloxicam (MOBIC) 7.5 MG tablet Take 1 tablet (7.5 mg total) by mouth daily. 30 tablet 5  . risperiDONE (RISPERDAL) 1 MG tablet Take 1 mg by mouth at bedtime.    . traZODone (DESYREL) 150 MG tablet Take 2 tablets by mouth at bedtime.      No results found for this  or any previous visit (from the past 48 hour(s)).  No results found.   A comprehensive review of systems was negative.  Blood pressure (!) 142/86, pulse (!) 102, temperature 97.9 F (36.6 C), temperature source Oral, resp. rate 18, height 5\' 10"  (1.778 m), weight 125.1 kg, SpO2 95 %.  General appearance: alert, cooperative and appears stated age Head: Normocephalic, without obvious abnormality, atraumatic Neck: supple, symmetrical, trachea midline Cardio: regular rate and rhythm Resp: clear to auscultation bilaterally Extremities: Intact sensation and capillary refill all digits.  +epl/fpl/io.  No wounds.  Pulses: 2+ and symmetric Skin: Skin color, texture, turgor normal. No rashes or lesions Neurologic: Grossly normal Incision/Wound: none  Assessment/Plan Right wrist mass.  Non operative and operative treatment options have been discussed with the patient and patient wishes to proceed with operative treatment. Risks, benefits, and alternatives of surgery have been discussed and the patient agrees with the plan of care.   [name] 04/29/2020, 12:40 PM

## 2020-04-29 NOTE — Anesthesia Procedure Notes (Signed)
Procedure Name: LMA Insertion Date/Time: 04/29/2020 1:29 PM Performed by: Willa Frater, CRNA Pre-anesthesia Checklist: Patient identified, Emergency Drugs available, Suction available and Patient being monitored Patient Re-evaluated:Patient Re-evaluated prior to induction Oxygen Delivery Method: Circle system utilized Preoxygenation: Pre-oxygenation with 100% oxygen Induction Type: IV induction Ventilation: Mask ventilation without difficulty LMA: LMA inserted LMA Size: 5.0 Number of attempts: 1 Airway Equipment and Method: Bite block Placement Confirmation: positive ETCO2 Tube secured with: Tape Dental Injury: Teeth and Oropharynx as per pre-operative assessment

## 2020-04-29 NOTE — Discharge Instructions (Addendum)

## 2020-04-29 NOTE — Anesthesia Postprocedure Evaluation (Signed)
Anesthesia Post Note  Patient: Steve Romero  Procedure(s) Performed: EXCISION MASS RIGHT WRIST (Right Wrist)     Patient location during evaluation: PACU Anesthesia Type: General Level of consciousness: awake and alert Pain management: pain level controlled Vital Signs Assessment: post-procedure vital signs reviewed and stable Respiratory status: spontaneous breathing, nonlabored ventilation and respiratory function stable Cardiovascular status: blood pressure returned to baseline and stable Postop Assessment: no apparent nausea or vomiting Anesthetic complications: no   No complications documented.  Last Vitals:  Vitals:   04/29/20 1445 04/29/20 1500  BP: 125/69 (!) 142/79  Pulse: 86 85  Resp: 14 16  Temp:    SpO2: 96% 97%    Last Pain:  Vitals:   04/29/20 1500  TempSrc:   PainSc: 1                  [name]

## 2020-04-29 NOTE — Op Note (Signed)
NAME: Steve Romero MEDICAL RECORD NO: [id number] DATE OF BIRTH: 1971-02-19 FACILITY: [facility] LOCATION: [facility] PHYSICIAN: Tennis Must, MD   OPERATIVE REPORT   DATE OF PROCEDURE: 04/29/20    PREOPERATIVE DIAGNOSIS:   Right wrist lipoma   POSTOPERATIVE DIAGNOSIS:   Right wrist lipoma   PROCEDURE:   Excision subcutaneous lipoma right wrist; 5 cm diameter   SURGEON:  [name], M.D.   ASSISTANT: none   ANESTHESIA:  General   INTRAVENOUS FLUIDS:  Per anesthesia flow sheet.   ESTIMATED BLOOD LOSS:  Minimal.   COMPLICATIONS:  None.   SPECIMENS:   Right wrist mass to pathology   TOURNIQUET TIME:    Total Tourniquet Time Documented: Upper Arm (Right) - 18 minutes Total: Upper Arm (Right) - 18 minutes    DISPOSITION:  Stable to PACU.   INDICATIONS: 49 year old male has noted a mass in his right wrist for approximately 10 years.  It is bothersome to him.  He wishes to have it removed. Risks, benefits and alternatives of surgery were discussed including the risks of blood loss, infection, damage to nerves, vessels, tendons, ligaments, bone for surgery, need for additional surgery, complications with wound healing, continued pain, stiffness, recurrence of mass.  He voiced understanding of these risks and elected to proceed.  OPERATIVE COURSE:  After being identified preoperatively by myself,  the patient and I agreed on the procedure and site of the procedure.  The surgical site was marked.  Surgical consent had been signed. He was given IV antibiotics as preoperative antibiotic prophylaxis. He was transferred to the operating room and placed on the operating table in supine position with the Right upper extremity on an arm board.  General anesthesia was induced by the anesthesiologist.  Right upper extremity was prepped and draped in normal sterile orthopedic fashion.  A surgical pause was performed between the surgeons, anesthesia, and operating room staff and  all were in agreement as to the patient, procedure, and site of procedure.  Tourniquet at the proximal aspect of the extremity was inflated to 250 mmHg after exsanguination of the arm with an Esmarch bandage.    Incision was made over the mass.  This carried in subcutaneous tissues by spreading technique.  Bipolar electrocautery was used to obtain hemostasis.  Mass was easily freed from surrounding tissues.  It was fatty in nature.  It was not adherent to any structures.  It was carefully removed.  Was sent to pathology for examination.  It measured 5 cm in diameter.  The wound was copiously irrigated with sterile saline.  The area was palpated and no mass was remaining.  The wound was closed with 4-0 nylon in a horizontal mattress fashion.  Was injected with quarter percent plain Marcaine to aid in postoperative analgesia.  It was dressed with sterile Xeroform 4 x 4's and ABD used as a soft splint.  This was wrapped with Kerlix and Ace bandage.  The tourniquet was deflated at 18 minutes.  Fingertips were pink with brisk capillary refill after deflation of tourniquet.  The operative  drapes were broken down.  The patient was awoken from anesthesia safely.  He was transferred back to the stretcher and taken to PACU in stable condition.  I will see him back in the office in 1 week for postoperative followup.  I will give him a prescription for Norco 5/325 1-2 tabs PO q6 hours prn pain, dispense # 20.   [name], MD Electronically signed, 04/29/20

## 2020-04-29 NOTE — Transfer of Care (Signed)
Immediate Anesthesia Transfer of Care Note  Patient: Steve Romero  Procedure(s) Performed: EXCISION MASS RIGHT WRIST (Right Wrist)  Patient Location: PACU  Anesthesia Type:General  Level of Consciousness: awake, alert , oriented, drowsy and patient cooperative  Airway & Oxygen Therapy: Patient Spontanous Breathing and Patient connected to face mask oxygen  Post-op Assessment: Report given to RN and Post -op Vital signs reviewed and stable  Post vital signs: Reviewed and stable  Last Vitals:  Vitals Value Taken Time  BP    Temp    Pulse    Resp    SpO2      Last Pain:  Vitals:   04/29/20 1152  TempSrc: Oral  PainSc: 0-No pain         Complications: No complications documented.

## 2020-04-30 ENCOUNTER — Encounter (HOSPITAL_BASED_OUTPATIENT_CLINIC_OR_DEPARTMENT_OTHER): Payer: Self-pay | Admitting: Orthopedic Surgery

## 2020-04-30 LAB — SURGICAL PATHOLOGY

## 2020-05-28 ENCOUNTER — Ambulatory Visit: Payer: Medicaid Other | Admitting: Family Medicine

## 2020-05-28 ENCOUNTER — Encounter: Payer: Self-pay | Admitting: Family Medicine

## 2020-05-28 NOTE — Progress Notes (Deleted)
Subjective: CC:*** PCP: [name], DO IFO:YDXAJO Hogate is a 49 y.o. male presenting to clinic today for:  1. ***   ROS: Per HPI  Allergies  Allergen Reactions  . Desipramine Nausea Only and Rash   Past Medical History:  Diagnosis Date  . Anxiety 10-2014  . Bipolar disorder ([place]) age 82  . COPD (chronic obstructive pulmonary disease) ([place]) 2016  . Depression age 43  . Enlarged prostate   . Hyperlipidemia 2016  . Hypertension 2013  . Schizoaffective disorder ([place]) 11-13-14  . Sleep apnea   . Thyroid disease 11-2014    Current Outpatient Medications:  .  gabapentin (NEURONTIN) 100 MG capsule, Take 1 capsule (100 mg total) by mouth 3 (three) times daily., Disp: 90 capsule, Rfl: 2 .  HYDROcodone-acetaminophen (NORCO) 5-325 MG tablet, 1-2 tabs po q6 hours prn pain, Disp: 20 tablet, Rfl: 0 .  meloxicam (MOBIC) 7.5 MG tablet, Take 1 tablet (7.5 mg total) by mouth daily., Disp: 30 tablet, Rfl: 5 .  risperiDONE (RISPERDAL) 1 MG tablet, Take 1 mg by mouth at bedtime., Disp: , Rfl:  .  traZODone (DESYREL) 150 MG tablet, Take 2 tablets by mouth at bedtime., Disp: , Rfl:  Social History   Socioeconomic History  . Marital status: Single    Spouse name: Not on file  . Number of children: Not on file  . Years of education: Not on file  . Highest education level: Not on file  Occupational History  . Not on file  Tobacco Use  . Smoking status: Current Every Day Smoker    Packs/day: 0.25    Years: 26.00    Pack years: 6.50    Types: Cigarettes  . Smokeless tobacco: Never Used  Vaping Use  . Vaping Use: Never used  Substance and Sexual Activity  . Alcohol use: No  . Drug use: No  . Sexual activity: Never    Birth control/protection: None  Other Topics Concern  . Not on file  Social History Narrative  . Not on file   Social Determinants of Health   Financial Resource Strain:   . Difficulty of Paying Living Expenses: Not on file  Food Insecurity:   .  Worried About Charity fundraiser in the Last Year: Not on file  . Ran Out of Food in the Last Year: Not on file  Transportation Needs:   . Lack of Transportation (Medical): Not on file  . Lack of Transportation (Non-Medical): Not on file  Physical Activity:   . Days of Exercise per Week: Not on file  . Minutes of Exercise per Session: Not on file  Stress:   . Feeling of Stress : Not on file  Social Connections:   . Frequency of Communication with Friends and Family: Not on file  . Frequency of Social Gatherings with Friends and Family: Not on file  . Attends Religious Services: Not on file  . Active Member of Clubs or Organizations: Not on file  . Attends Archivist Meetings: Not on file  . Marital Status: Not on file  Intimate Partner Violence:   . Fear of Current or Ex-Partner: Not on file  . Emotionally Abused: Not on file  . Physically Abused: Not on file  . Sexually Abused: Not on file   Family History  Problem Relation Age of Onset  . Diabetes Father   . Heart disease Father   . Heart disease Maternal Grandmother   . Stroke Maternal Grandfather  Objective: Office vital signs reviewed. There were no vitals taken for this visit.  Physical Examination:  General: Awake, alert, *** nourished, No acute distress HEENT: Normal    Neck: No masses palpated. No lymphadenopathy    Ears: Tympanic membranes intact, normal light reflex, no erythema, no bulging    Eyes: PERRLA, extraocular membranes intact, sclera ***    Nose: nasal turbinates moist, *** nasal discharge    Throat: moist mucus membranes, no erythema, *** tonsillar exudate.  Airway is patent Cardio: regular rate and rhythm, S1S2 heard, no murmurs appreciated Pulm: clear to auscultation bilaterally, no wheezes, rhonchi or rales; normal work of breathing on room air GI: soft, non-tender, non-distended, bowel sounds present x4, no hepatomegaly, no splenomegaly, no masses GU: external vaginal tissue ***,  cervix ***, *** punctate lesions on cervix appreciated, *** discharge from cervical os, *** bleeding, *** cervical motion tenderness, *** abdominal/ adnexal masses Extremities: warm, well perfused, No edema, cyanosis or clubbing; +*** pulses bilaterally MSK: *** gait and *** station Skin: dry; intact; no rashes or lesions Neuro: *** Strength and light touch sensation grossly intact, *** DTRs ***/4  Assessment/ Plan: 49 y.o. male   ***  No orders of the defined types were placed in this encounter.  No orders of the defined types were placed in this encounter.    [name], DO [place] [phone]

## 2020-09-17 ENCOUNTER — Emergency Department (HOSPITAL_COMMUNITY)
Admission: EM | Admit: 2020-09-17 | Discharge: 2020-09-17 | Disposition: A | Discharge status: Home / Self Care | Payer: Medicaid Other | Attending: Emergency Medicine | Admitting: Emergency Medicine

## 2020-09-17 ENCOUNTER — Telehealth: Payer: Self-pay

## 2020-09-17 ENCOUNTER — Encounter (HOSPITAL_COMMUNITY): Payer: Self-pay | Admitting: *Deleted

## 2020-09-17 ENCOUNTER — Other Ambulatory Visit: Payer: Self-pay

## 2020-09-17 ENCOUNTER — Emergency Department (HOSPITAL_COMMUNITY): Payer: Medicaid Other

## 2020-09-17 ENCOUNTER — Ambulatory Visit: Payer: Medicaid Other | Admitting: Family Medicine

## 2020-09-17 DIAGNOSIS — R1011 Right upper quadrant pain: Secondary | ICD-10-CM | POA: Insufficient documentation

## 2020-09-17 DIAGNOSIS — I10 Essential (primary) hypertension: Secondary | ICD-10-CM | POA: Diagnosis not present

## 2020-09-17 DIAGNOSIS — J449 Chronic obstructive pulmonary disease, unspecified: Secondary | ICD-10-CM | POA: Diagnosis not present

## 2020-09-17 DIAGNOSIS — K8020 Calculus of gallbladder without cholecystitis without obstruction: Secondary | ICD-10-CM

## 2020-09-17 DIAGNOSIS — F17210 Nicotine dependence, cigarettes, uncomplicated: Secondary | ICD-10-CM | POA: Insufficient documentation

## 2020-09-17 LAB — CBC WITH DIFFERENTIAL/PLATELET
Abs Immature Granulocytes: 0.08 10*3/uL — ABNORMAL HIGH (ref 0.00–0.07)
Basophils Absolute: 0.1 10*3/uL (ref 0.0–0.1)
Basophils Relative: 1 %
Eosinophils Absolute: 0.1 10*3/uL (ref 0.0–0.5)
Eosinophils Relative: 0 %
HCT: 53 % — ABNORMAL HIGH (ref 39.0–52.0)
Hemoglobin: 18.1 g/dL — ABNORMAL HIGH (ref 13.0–17.0)
Immature Granulocytes: 1 %
Lymphocytes Relative: 21 %
Lymphs Abs: 2.8 10*3/uL (ref 0.7–4.0)
MCH: 30.6 pg (ref 26.0–34.0)
MCHC: 34.2 g/dL (ref 30.0–36.0)
MCV: 89.5 fL (ref 80.0–100.0)
Monocytes Absolute: 0.8 10*3/uL (ref 0.1–1.0)
Monocytes Relative: 6 %
Neutro Abs: 9.6 10*3/uL — ABNORMAL HIGH (ref 1.7–7.7)
Neutrophils Relative %: 71 %
Platelets: 256 10*3/uL (ref 150–400)
RBC: 5.92 MIL/uL — ABNORMAL HIGH (ref 4.22–5.81)
RDW: 12.3 % (ref 11.5–15.5)
WBC: 13.4 10*3/uL — ABNORMAL HIGH (ref 4.0–10.5)
nRBC: 0 % (ref 0.0–0.2)

## 2020-09-17 LAB — LIPASE, BLOOD: Lipase: 23 U/L (ref 11–51)

## 2020-09-17 LAB — COMPREHENSIVE METABOLIC PANEL
ALT: 27 U/L (ref 0–44)
AST: 15 U/L (ref 15–41)
Albumin: 4 g/dL (ref 3.5–5.0)
Alkaline Phosphatase: 133 U/L — ABNORMAL HIGH (ref 38–126)
Anion gap: 12 (ref 5–15)
BUN: 11 mg/dL (ref 6–20)
CO2: 25 mmol/L (ref 22–32)
Calcium: 8.6 mg/dL — ABNORMAL LOW (ref 8.9–10.3)
Chloride: 96 mmol/L — ABNORMAL LOW (ref 98–111)
Creatinine, Ser: 0.98 mg/dL (ref 0.61–1.24)
GFR, Estimated: >60 mL/min (ref >60)
Glucose, Bld: 112 mg/dL — ABNORMAL HIGH (ref 70–99)
Potassium: 4.1 mmol/L (ref 3.5–5.1)
Sodium: 133 mmol/L — ABNORMAL LOW (ref 135–145)
Total Bilirubin: 0.8 mg/dL (ref 0.3–1.2)
Total Protein: 7.3 g/dL (ref 6.5–8.1)

## 2020-09-17 MED ORDER — HYDROMORPHONE HCL 1 MG/ML IJ SOLN
1.0000 mg | Freq: Once | INTRAMUSCULAR | Status: AC
  Administered 2020-09-17: 1 mg via INTRAVENOUS
  Filled 2020-09-17: qty 1

## 2020-09-17 MED ORDER — IOHEXOL 300 MG/ML  SOLN
100.0000 mL | Freq: Once | INTRAMUSCULAR | Status: AC | PRN
  Administered 2020-09-17: 100 mL via INTRAVENOUS

## 2020-09-17 MED ORDER — SODIUM CHLORIDE 0.9 % IV SOLN: INTRAVENOUS | Status: DC

## 2020-09-17 MED ORDER — ONDANSETRON 4 MG PO TBDP: 4.0000 mg | ORAL_TABLET | Freq: Three times a day (TID) | ORAL | 0 refills | Status: DC | PRN

## 2020-09-17 MED ORDER — SENNOSIDES-DOCUSATE SODIUM 8.6-50 MG PO TABS: 1.0000 | ORAL_TABLET | Freq: Every evening | ORAL | 0 refills | Status: DC | PRN

## 2020-09-17 MED ORDER — HYDROCODONE-ACETAMINOPHEN 5-325 MG PO TABS: 1.0000 | ORAL_TABLET | Freq: Four times a day (QID) | ORAL | 0 refills | Status: DC | PRN

## 2020-09-17 MED ORDER — ONDANSETRON HCL 4 MG/2ML IJ SOLN
4.0000 mg | Freq: Once | INTRAMUSCULAR | Status: AC
  Administered 2020-09-17: 4 mg via INTRAVENOUS
  Filled 2020-09-17: qty 2

## 2020-09-17 NOTE — Discharge Instructions (Signed)
You were seen in the emergency department today with pain in your abdomen.  You have gallstones which may be causing the discomfort.  Please call the surgeon listed first thing tomorrow morning to schedule a close follow-up appointment.  If you develop worsening pain, fevers, vomiting you should return to the emergency department for reevaluation.  I have called in some pain medications for you to take only as instructed.  Do not take these with alcohol.  Do not drive a car while taking these.  I have called in medicine for constipation which is sometimes a side effect of these medicines.  Have also called in a medicine for vomiting and nausea.

## 2020-09-17 NOTE — ED Triage Notes (Signed)
Right lower quadrant pain onset yesterday, had one episode last week

## 2020-09-17 NOTE — ED Provider Notes (Signed)
Blood pressure (!) 145/97, pulse 94, temperature 98.6 F (37 C), temperature source Oral, resp. rate 18, height 5' 10"  (1.778 m), weight 126.1 kg, SpO2 98 %.  Assuming care from Dr. Rogene Houston.  In short, Steve Romero is a 50 y.o. male with a chief complaint of Abdominal Pain .  Refer to the original H&P for additional details.  The current plan of care is to follow up on [place].  03:43 PM  Ultrasound reviewed showing gallstones but no findings of acute cholecystitis.  In review of patient's lab work he does have leukocytosis of 13.4 but bilirubin is normal.  Alk phos slightly elevated.  On reexamination the patient states he is feeling better but continues to have some discomfort.  He is tender in the right upper quadrant.  Additionally on exam does have some bruising type pattern in this area which he states has been present for at least 2 months, possibly longer.  Does not appear to be cellulitis.  We will discussed the case with general surgery with symptomatic cholelithiasis to ensure close follow-up.  Reviewed case with Dr. Constance Haw by phone.  Labs looking hemoconcentrated.  Patient feeling improved overall.  Will give him contact information for general surgery and have him call for an appointment.     Margette Fast, MD 09/17/20 [phone]

## 2020-09-17 NOTE — ED Provider Notes (Signed)
[facility] Provider Note   CSN: [id number] Arrival date & time: 09/17/20  1007     History Chief Complaint  Patient presents with  . Abdominal Pain    Steve Romero is a 50 y.o. male.  Patient with onset of right upper quadrant abdominal pain some in the right lower quadrant onset yesterday.  Had similar episode about a week ago.  Not associated with any nausea or vomiting.  Patient still has his gallbladder.        Past Medical History:  Diagnosis Date  . Anxiety 10-2014  . Bipolar disorder ([place]) age 60  . COPD (chronic obstructive pulmonary disease) ([place]) 2016  . Depression age 54  . Enlarged prostate   . Hyperlipidemia 2016  . Hypertension 2013  . Schizoaffective disorder ([place]) 11-13-14  . Sleep apnea   . Thyroid disease 11-2014    Patient Active Problem List   Diagnosis Date Noted  . Mass of right upper extremity 02/14/2020  . Unilateral recurrent inguinal hernia without obstruction or gangrene   . Carpal tunnel syndrome, left 09/24/2016  . Obesity 09/01/2015  . HTN (hypertension) 04/22/2015  . Hyperlipidemia 04/22/2015  . Schizoaffective disorder ([place]) 04/22/2015  . Anxiety state 04/22/2015  . Cigarette nicotine dependence without complication 38/45/3646  . Edema 04/22/2015  . Lymphadenopathy 04/22/2015    Past Surgical History:  Procedure Laterality Date  . CARPAL TUNNEL RELEASE Left 02/21/2016   Procedure: CARPAL TUNNEL RELEASE;  Surgeon: Carole Civil, MD;  Location: [facility];  Service: Orthopedics;  Laterality: Left;  . HERNIA REPAIR Left 2002   groin  . INGUINAL HERNIA REPAIR Left 04/05/2017   Procedure: RECURRENT HERNIA REPAIR INGUINAL ADULT WITH MESH;  Surgeon: [name], MD;  Location: [facility];  Service: General;  Laterality: Left;  Marland Kitchen MASS EXCISION Right 04/29/2020   Procedure: EXCISION MASS RIGHT WRIST;  Surgeon: [name], MD;  Location: [facility];  Service: Orthopedics;  Laterality: Right;        Family History  Problem Relation Age of Onset  . Diabetes Father   . Heart disease Father   . Heart disease Maternal Grandmother   . Stroke Maternal Grandfather     Social History   Tobacco Use  . Smoking status: Current Every Day Smoker    Packs/day: 0.25    Years: 26.00    Pack years: 6.50    Types: Cigarettes  . Smokeless tobacco: Never Used  Vaping Use  . Vaping Use: Never used  Substance Use Topics  . Alcohol use: No  . Drug use: No    Home Medications Prior to Admission medications   Medication Sig Start Date End Date Taking? Authorizing Provider  gabapentin (NEURONTIN) 100 MG capsule Take 1 capsule (100 mg total) by mouth 3 (three) times daily. 02/05/20   Carole Civil, MD  HYDROcodone-acetaminophen [facility]) 5-325 MG tablet 1-2 tabs po q6 hours prn pain 04/29/20   [name], MD  meloxicam (MOBIC) 7.5 MG tablet Take 1 tablet (7.5 mg total) by mouth daily. 02/05/20   Carole Civil, MD  risperiDONE (RISPERDAL) 1 MG tablet Take 1 mg by mouth at bedtime. 10/18/18   [provider]  traZODone (DESYREL) 150 MG tablet Take 2 tablets by mouth at bedtime. 09/18/19   [provider]    Allergies    Desipramine  Review of Systems   Review of Systems  Constitutional: Negative for chills and fever.  HENT: Negative for congestion, rhinorrhea and sore throat.  Eyes: Negative for visual disturbance.  Respiratory: Negative for cough and shortness of breath.   Cardiovascular: Negative for chest pain and leg swelling.  Gastrointestinal: Positive for abdominal pain. Negative for diarrhea, nausea and vomiting.  Genitourinary: Negative for dysuria.  Musculoskeletal: Negative for back pain and neck pain.  Skin: Negative for rash.  Neurological: Negative for dizziness, light-headedness and headaches.  Hematological: Does not bruise/bleed easily.  Psychiatric/Behavioral: Negative for confusion.    Physical Exam Updated Vital Signs BP (!)  145/97   Pulse 94   Temp 98.6 F (37 C) (Oral)   Resp 18   Ht 1.778 m ($Remove'5\' 10"'zfhKqdV$ )   Wt 126.1 kg   SpO2 98%   BMI 39.89 kg/m   Physical Exam Vitals and nursing note reviewed.  Constitutional:      General: He is not in acute distress.    Appearance: Normal appearance. He is well-developed.  HENT:     Head: Normocephalic and atraumatic.  Eyes:     Extraocular Movements: Extraocular movements intact.     Conjunctiva/sclera: Conjunctivae normal.     Pupils: Pupils are equal, round, and reactive to light.  Cardiovascular:     Rate and Rhythm: Normal rate and regular rhythm.     Heart sounds: No murmur heard.   Pulmonary:     Effort: Pulmonary effort is normal. No respiratory distress.     Breath sounds: Normal breath sounds.  Abdominal:     Palpations: Abdomen is soft.     Tenderness: There is abdominal tenderness. There is guarding.     Comments: Tender to palpation right upper quadrant.  Musculoskeletal:        General: Normal range of motion.     Cervical back: Normal range of motion and neck supple.  Skin:    General: Skin is warm and dry.     Capillary Refill: Capillary refill takes less than 2 seconds.  Neurological:     General: No focal deficit present.     Mental Status: He is alert and oriented to person, place, and time.     Cranial Nerves: No cranial nerve deficit.     Sensory: No sensory deficit.     ED Results / Procedures / Treatments   Labs (all labs ordered are listed, but only abnormal results are displayed) Labs Reviewed  COMPREHENSIVE METABOLIC PANEL - Abnormal; Notable for the following components:      Result Value   Sodium 133 (*)    Chloride 96 (*)    Glucose, Bld 112 (*)    Calcium 8.6 (*)    Alkaline Phosphatase 133 (*)    All other components within normal limits  CBC WITH DIFFERENTIAL/PLATELET - Abnormal; Notable for the following components:   WBC 13.4 (*)    RBC 5.92 (*)    Hemoglobin 18.1 (*)    HCT 53.0 (*)    Neutro Abs 9.6 (*)     Abs Immature Granulocytes 0.08 (*)    All other components within normal limits  LIPASE, BLOOD    EKG None  Radiology CT Abdomen Pelvis W Contrast  Result Date: 09/17/2020 CLINICAL DATA:  Right lower quadrant pain EXAM: CT ABDOMEN AND PELVIS WITH CONTRAST TECHNIQUE: Multidetector CT imaging of the abdomen and pelvis was performed using the standard protocol following bolus administration of intravenous contrast. CONTRAST:  173mL OMNIPAQUE IOHEXOL 300 MG/ML  SOLN COMPARISON:  2018 FINDINGS: Lower chest: Mild atelectasis/scarring at the lung bases. Hepatobiliary: No focal liver abnormality is seen. No gallstones or  biliary dilatation. Minimal infiltration of the pericholecystic fat. Possible mild wall thickening. Pancreas: Unremarkable. Spleen: Unremarkable. Adrenals/Urinary Tract: Adrenals, kidneys, and bladder are unremarkable. Stomach/Bowel: Stomach is within normal limits. Bowel is normal in caliber. Appendix is normal in caliber extending superiorly toward the gallbladder Vascular/Lymphatic: No significant vascular abnormality. No enlarged lymph nodes. Reproductive: Normal prostate. Other: No free fluid.  Fat containing right inguinal hernia. Musculoskeletal: Degenerative changes of the included spine particularly at L3-L4. IMPRESSION: Normal appendix. Possible mild gallbladder wall thickening with minimal infiltration of the surrounding fat. Right upper quadrant ultrasound is suggested. Electronically Signed   By: Macy Mis M.D.   On: 09/17/2020 13:50    Procedures Procedures   Medications Ordered in ED Medications  0.9 %  sodium chloride infusion ( Intravenous New Bag/Given 09/17/20 1228)  ondansetron (ZOFRAN) injection 4 mg (4 mg Intravenous Given 09/17/20 1228)  HYDROmorphone (DILAUDID) injection 1 mg (1 mg Intravenous Given 09/17/20 1228)  iohexol (OMNIPAQUE) 300 MG/ML solution 100 mL (100 mLs Intravenous Contrast Given 09/17/20 1317)    ED Course  I have reviewed the triage  vital signs and the nursing notes.  Pertinent labs & imaging results that were available during my care of the patient were reviewed by me and considered in my medical decision making (see chart for details).    MDM Rules/Calculators/A&P                           Patient is right upper quadrant pain improved with hydromorphone.  CT scan raise some concerns about inflammation of the gallbladder but I did not see any gallstones.  Patient's labs without significant abnormalities other than alk phos is elevated.  And his white blood cell count was 13,000.  Patient will get ultrasound of right upper quadrant area to evaluate this further.  Disposition based on these findings  Final Clinical Impression(s) / ED Diagnoses Final diagnoses:  Right upper quadrant abdominal pain    Rx / DC Orders ED Discharge Orders    None       Fredia Sorrow, MD 09/17/20 1433

## 2020-09-17 NOTE — Telephone Encounter (Signed)
Tried calling patient. No answer, no machine.

## 2020-09-17 NOTE — ED Notes (Signed)
Pt back from CT

## 2020-09-17 NOTE — ED Notes (Signed)
Pt to CT

## 2020-09-18 ENCOUNTER — Telehealth: Payer: Self-pay | Admitting: *Deleted

## 2020-09-18 NOTE — Telephone Encounter (Signed)
Transition Care Management Follow-up Telephone Call  Date of discharge and from where: 09/17/2020 - [facility]  How have you been since you were released from the hospital? "A little better but still have some discomfort"  Any questions or concerns? No  Items Reviewed:  Did the pt receive and understand the discharge instructions provided? Yes   Medications obtained and verified? Yes   Other? No   Any new allergies since your discharge? No   Dietary orders reviewed? Yes  Do you have support at home? Yes   Home Care and Equipment/Supplies: Were home health services ordered? not applicable If so, what is the name of the agency? N/A  Has the agency set up a time to come to the patient's home? not applicable Were any new equipment or medical supplies ordered?  No What is the name of the medical supply agency? N/A Were you able to get the supplies/equipment? not applicable Do you have any questions related to the use of the equipment or supplies? No  Functional Questionnaire: (I = Independent and D = Dependent) ADLs: I  Bathing/Dressing- I  Meal Prep- I  Eating- I  Maintaining continence- I  Transferring/Ambulation- I  Managing Meds- I  Follow up appointments reviewed:   PCP Hospital f/u appt confirmed? No    Specialist Hospital f/u appt confirmed? No  - pt has general surgery's phone number if he needs to make an appointment  Are transportation arrangements needed? No   If their condition worsens, is the pt aware to call PCP or go to the Emergency Dept.? Yes  Was the patient provided with contact information for the PCP's office or ED? Yes  Was to pt encouraged to call back with questions or concerns? Yes

## 2020-09-18 NOTE — Telephone Encounter (Signed)
Patient went to the hospital 3/15- this encounter will be closed.

## 2020-09-24 ENCOUNTER — Other Ambulatory Visit: Payer: Self-pay

## 2020-09-24 ENCOUNTER — Encounter: Payer: Self-pay | Admitting: Nurse Practitioner

## 2020-09-24 ENCOUNTER — Ambulatory Visit: Payer: Medicaid Other | Admitting: Nurse Practitioner

## 2020-09-24 VITALS — BP 131/85 | HR 95 | Temp 97.0°F | Ht 70.0 in | Wt 279.1 lb

## 2020-09-24 DIAGNOSIS — R1011 Right upper quadrant pain: Secondary | ICD-10-CM

## 2020-09-24 MED ORDER — HYDROCODONE-ACETAMINOPHEN 5-325 MG PO TABS: 1.0000 | ORAL_TABLET | Freq: Four times a day (QID) | ORAL | 0 refills | Status: AC | PRN

## 2020-09-24 MED ORDER — ONDANSETRON 4 MG PO TBDP: 4.0000 mg | ORAL_TABLET | Freq: Three times a day (TID) | ORAL | 0 refills | Status: DC | PRN

## 2020-09-24 NOTE — Patient Instructions (Signed)

## 2020-09-24 NOTE — Progress Notes (Signed)
Acute Office Visit  Subjective:    Patient ID: Steve Romero, male    DOB: August 21, 1970, 50 y.o.   MRN: [id number]  Chief Complaint  Patient presents with  . Abdominal Pain    Abdominal Pain This is a recurrent problem. The current episode started in the past 7 days. The problem occurs constantly. The problem has been unchanged. The pain is located in the RUQ. The pain is moderate. The quality of the pain is aching and a sensation of fullness. The abdominal pain radiates to the back. Associated symptoms include nausea. Pertinent negatives include no constipation or fever. Nothing aggravates the pain. He has tried oral narcotic analgesics for the symptoms. The treatment provided mild relief. Prior diagnostic workup includes GI consult (Presurgical consult 09/26/2020).  Recent ED visit shows cholelithiasis.  Past Medical History:  Diagnosis Date  . Anxiety 10-2014  . Bipolar disorder ([place]) age 35  . COPD (chronic obstructive pulmonary disease) ([place]) 2016  . Depression age 74  . Enlarged prostate   . Hyperlipidemia 2016  . Hypertension 2013  . Schizoaffective disorder ([place]) 11-13-14  . Sleep apnea   . Thyroid disease 11-2014    Past Surgical History:  Procedure Laterality Date  . CARPAL TUNNEL RELEASE Left 02/21/2016   Procedure: CARPAL TUNNEL RELEASE;  Surgeon: Carole Civil, MD;  Location: [facility];  Service: Orthopedics;  Laterality: Left;  . HERNIA REPAIR Left 2002   groin  . INGUINAL HERNIA REPAIR Left 04/05/2017   Procedure: RECURRENT HERNIA REPAIR INGUINAL ADULT WITH MESH;  Surgeon: [name], MD;  Location: [facility];  Service: General;  Laterality: Left;  Marland Kitchen MASS EXCISION Right 04/29/2020   Procedure: EXCISION MASS RIGHT WRIST;  Surgeon: [name], MD;  Location: [facility];  Service: Orthopedics;  Laterality: Right;    Family History  Problem Relation Age of Onset  . Diabetes Father   . Heart disease Father   . Heart disease Maternal Grandmother    . Stroke Maternal Grandfather     Social History   Socioeconomic History  . Marital status: Single    Spouse name: Not on file  . Number of children: Not on file  . Years of education: Not on file  . Highest education level: Not on file  Occupational History  . Not on file  Tobacco Use  . Smoking status: Current Every Day Smoker    Packs/day: 0.25    Years: 26.00    Pack years: 6.50    Types: Cigarettes  . Smokeless tobacco: Never Used  Vaping Use  . Vaping Use: Never used  Substance and Sexual Activity  . Alcohol use: No  . Drug use: No  . Sexual activity: Never    Birth control/protection: None  Other Topics Concern  . Not on file  Social History Narrative  . Not on file   Social Determinants of Health   Financial Resource Strain: Not on file  Food Insecurity: Not on file  Transportation Needs: Not on file  Physical Activity: Not on file  Stress: Not on file  Social Connections: Not on file  Intimate Partner Violence: Not on file    Outpatient Medications Prior to Visit  Medication Sig Dispense Refill  . risperiDONE (RISPERDAL) 1 MG tablet Take 1 mg by mouth at bedtime.    . senna-docusate (SENOKOT-S) 8.6-50 MG tablet Take 1 tablet by mouth at bedtime as needed for mild constipation or moderate constipation. 20 tablet 0  . traZODone (DESYREL) 150 MG  tablet Take 2 tablets by mouth at bedtime.    Marland Kitchen HYDROcodone-acetaminophen (NORCO/VICODIN) 5-325 MG tablet Take 1 tablet by mouth every 6 (six) hours as needed for severe pain. 10 tablet 0  . ondansetron (ZOFRAN ODT) 4 MG disintegrating tablet Take 1 tablet (4 mg total) by mouth every 8 (eight) hours as needed. 20 tablet 0  . gabapentin (NEURONTIN) 100 MG capsule Take 1 capsule (100 mg total) by mouth 3 (three) times daily. 90 capsule 2  . meloxicam (MOBIC) 7.5 MG tablet Take 1 tablet (7.5 mg total) by mouth daily. 30 tablet 5   No facility-administered medications prior to visit.    Allergies  Allergen  Reactions  . Desipramine Nausea Only and Rash    Review of Systems  Constitutional: Negative for fever.  HENT: Negative.   Eyes: Negative.   Respiratory: Negative.   Cardiovascular: Negative.   Gastrointestinal: Positive for abdominal pain and nausea. Negative for constipation.  Genitourinary: Negative.   Musculoskeletal: Negative.   All other systems reviewed and are negative.      Objective:    Physical Exam Vitals reviewed.  Constitutional:      Appearance: Normal appearance. He is well-developed. He is obese.     Interventions: Face mask in place.  HENT:     Head: Normocephalic.     Nose: Nose normal.  Eyes:     Conjunctiva/sclera: Conjunctivae normal.  Cardiovascular:     Rate and Rhythm: Normal rate and regular rhythm.     Pulses: Normal pulses.     Heart sounds: Normal heart sounds.  Pulmonary:     Effort: Pulmonary effort is normal.     Breath sounds: Normal breath sounds.  Abdominal:     General: There is distension.     Tenderness: There is abdominal tenderness. There is no right CVA tenderness or left CVA tenderness.  Musculoskeletal:     Cervical back: Normal range of motion.  Skin:    General: Skin is warm.  Neurological:     Mental Status: He is alert and oriented to person, place, and time.  Psychiatric:        Behavior: Behavior is cooperative.     BP 131/85   Pulse 95   Temp (!) 97 F (36.1 C) (Temporal)   Ht 5\' 10"  (1.778 m)   Wt 279 lb 2 oz (126.6 kg)   BMI 40.05 kg/m  Wt Readings from Last 3 Encounters:  09/24/20 279 lb 2 oz (126.6 kg)  09/17/20 278 lb (126.1 kg)  04/29/20 275 lb 12.7 oz (125.1 kg)    Health Maintenance Due  Topic Date Due  . Hepatitis C Screening  Never done  . COLONOSCOPY (Pts 45-59yrs Insurance coverage will need to be confirmed)  Never done    There are no preventive care reminders to display for this patient.   Lab Results  Component Value Date   TSH 3.430 10/04/2019   Lab Results  Component Value  Date   WBC 13.4 (H) 09/17/2020   HGB 18.1 (H) 09/17/2020   HCT 53.0 (H) 09/17/2020   MCV 89.5 09/17/2020   PLT 256 09/17/2020   Lab Results  Component Value Date   NA 133 (L) 09/17/2020   K 4.1 09/17/2020   CO2 25 09/17/2020   GLUCOSE 112 (H) 09/17/2020   BUN 11 09/17/2020   CREATININE 0.98 09/17/2020   BILITOT 0.8 09/17/2020   ALKPHOS 133 (H) 09/17/2020   AST 15 09/17/2020   ALT 27 09/17/2020  PROT 7.3 09/17/2020   ALBUMIN 4.0 09/17/2020   CALCIUM 8.6 (L) 09/17/2020   ANIONGAP 12 09/17/2020   Lab Results  Component Value Date   CHOL 218 (H) 10/04/2019   Lab Results  Component Value Date   HDL 27 (L) 10/04/2019   Lab Results  Component Value Date   LDLCALC 151 (H) 10/04/2019   Lab Results  Component Value Date   TRIG 217 (H) 10/04/2019   Lab Results  Component Value Date   CHOLHDL 8.1 (H) 10/04/2019   Lab Results  Component Value Date   HGBA1C 6.0 10/04/2019       Assessment & Plan:   Problem List Items Addressed This Visit      Other   Right upper quadrant abdominal pain - Primary    Right upper quadrant pain not well controlled.  Current ED visit shows cholelithiasis.  Surgical consult upcoming 09/26/2020. Symptom management for pain continued.  I gave patient 5 days of Vicodin 5-325 mg tablet every 6 hours as needed for severe pain. Education provided to patient with printed handouts given. Rx sent to pharmacy.  Advised patient to follow-up with worsening or unresolved symptoms.      Relevant Medications   ondansetron (ZOFRAN ODT) 4 MG disintegrating tablet   HYDROcodone-acetaminophen (NORCO) 5-325 MG tablet       Meds ordered this encounter  Medications  . ondansetron (ZOFRAN ODT) 4 MG disintegrating tablet    Sig: Take 1 tablet (4 mg total) by mouth every 8 (eight) hours as needed.    Dispense:  20 tablet    Refill:  0    Order Specific Question:   Supervising Provider    Answer:   [name] [[id number]]  .  HYDROcodone-acetaminophen (NORCO) 5-325 MG tablet    Sig: Take 1 tablet by mouth every 6 (six) hours as needed for up to 5 days for moderate pain.    Dispense:  20 tablet    Refill:  0    Order Specific Question:   Supervising Provider    Answer:   [name] [[id number]]     Ivy Lynn, NP

## 2020-09-24 NOTE — Assessment & Plan Note (Signed)
Right upper quadrant pain not well controlled.  Current ED visit shows cholelithiasis.  Surgical consult upcoming 09/26/2020. Symptom management for pain continued.  I gave patient 5 days of Vicodin 5-325 mg tablet every 6 hours as needed for severe pain. Education provided to patient with printed handouts given. Rx sent to pharmacy.  Advised patient to follow-up with worsening or unresolved symptoms.

## 2020-09-26 ENCOUNTER — Encounter: Payer: Self-pay | Admitting: General Surgery

## 2020-09-26 ENCOUNTER — Ambulatory Visit (INDEPENDENT_AMBULATORY_CARE_PROVIDER_SITE_OTHER): Payer: Medicaid Other | Admitting: General Surgery

## 2020-09-26 ENCOUNTER — Other Ambulatory Visit: Payer: Self-pay

## 2020-09-26 VITALS — BP 153/102 | HR 89 | Temp 98.1°F | Resp 20 | Ht 70.0 in | Wt 281.0 lb

## 2020-09-26 DIAGNOSIS — R1901 Right upper quadrant abdominal swelling, mass and lump: Secondary | ICD-10-CM

## 2020-09-26 DIAGNOSIS — D489 Neoplasm of uncertain behavior, unspecified: Secondary | ICD-10-CM

## 2020-09-26 DIAGNOSIS — K8020 Calculus of gallbladder without cholecystitis without obstruction: Secondary | ICD-10-CM | POA: Diagnosis not present

## 2020-09-26 NOTE — Progress Notes (Signed)
[facility] History and Physical  Reason for Referral: Gallstones  Referring Physician:  ED   Chief Complaint    New Patient (Initial Visit)      Steve Romero is a 50 y.o. male.  HPI:  Steve Romero is a 50 yo who says that he has been having RUQ pain and some nausea/bloating for years and he knew he had gallstones but that in the last few weeks it has become worse and more symptomatic.  He says that he went to the ED and was evaluated with a CT and [place] and they found the stones but normal labs. He says that the pain is worse with fatty foods and can be as high as 7/10.  He was also noted to have a discoloration/ nodularity of the abdominal wall in the ED. He says that this has been present for at least 4 months. He denies any hot pack/ heating pad use to the area. He says it is tender somewhat. He does not have any other lesions like it on the body.    Past Medical History:  Diagnosis Date  . Anxiety 10-2014  . Bipolar disorder ([place]) age 24  . COPD (chronic obstructive pulmonary disease) ([place]) 2016  . Depression age 50  . Enlarged prostate   . Hyperlipidemia 2016  . Hypertension 2013  . Schizoaffective disorder ([place]) 11-13-14  . Sleep apnea   . Thyroid disease 11-2014    Past Surgical History:  Procedure Laterality Date  . CARPAL TUNNEL RELEASE Left 02/21/2016   Procedure: CARPAL TUNNEL RELEASE;  Surgeon: Carole Civil, MD;  Location: [facility];  Service: Orthopedics;  Laterality: Left;  . HERNIA REPAIR Left 2002   groin  . INGUINAL HERNIA REPAIR Left 04/05/2017   Procedure: RECURRENT HERNIA REPAIR INGUINAL ADULT WITH MESH;  Surgeon: [name], MD;  Location: [facility];  Service: General;  Laterality: Left;  Marland Kitchen MASS EXCISION Right 04/29/2020   Procedure: EXCISION MASS RIGHT WRIST;  Surgeon: [name], MD;  Location: [facility];  Service: Orthopedics;  Laterality: Right;    Family History  Problem Relation Age of Onset  . Diabetes Father    . Heart disease Father   . Heart disease Maternal Grandmother   . Stroke Maternal Grandfather     Social History   Tobacco Use  . Smoking status: Current Every Day Smoker    Packs/day: 0.25    Years: 26.00    Pack years: 6.50    Types: Cigarettes  . Smokeless tobacco: Never Used  Vaping Use  . Vaping Use: Never used  Substance Use Topics  . Alcohol use: No  . Drug use: No    Medications: I have reviewed the patient's current medications. Allergies as of 09/26/2020      Reactions   Desipramine Nausea Only, Rash      Medication List       Accurate as of September 26, 2020  1:31 PM. If you have any questions, ask your nurse or doctor.        HYDROcodone-acetaminophen 5-325 MG tablet Commonly known as: Norco Take 1 tablet by mouth every 6 (six) hours as needed for up to 5 days for moderate pain.   ondansetron 4 MG disintegrating tablet Commonly known as: Zofran ODT Take 1 tablet (4 mg total) by mouth every 8 (eight) hours as needed.   risperiDONE 1 MG tablet Commonly known as: RISPERDAL Take 1 mg by mouth at bedtime.   senna-docusate 8.6-50 MG tablet  Commonly known as: Senokot-S Take 1 tablet by mouth at bedtime as needed for mild constipation or moderate constipation.   traZODone 150 MG tablet Commonly known as: DESYREL Take 2 tablets by mouth at bedtime.        ROS:  A comprehensive review of systems was negative except for: Respiratory: positive for SOB with ambulating around the block Gastrointestinal: positive for abdominal pain and nausea Musculoskeletal: positive for back pain and neck pain  Blood pressure (!) 153/102, pulse 89, temperature 98.1 F (36.7 C), temperature source Other (Comment), resp. rate 20, height 5\' 10"  (1.778 m), weight 281 lb (127.5 kg), SpO2 97 %. Physical Exam Vitals reviewed.  Constitutional:      Appearance: He is obese.  HENT:     Head: Normocephalic.     Nose: Nose normal.     Mouth/Throat:     Mouth: Mucous membranes  are moist.  Eyes:     Extraocular Movements: Extraocular movements intact.  Cardiovascular:     Rate and Rhythm: Normal rate and regular rhythm.  Pulmonary:     Effort: Pulmonary effort is normal.     Breath sounds: Normal breath sounds.  Abdominal:     General: There is no distension.     Palpations: Abdomen is soft.     Tenderness: There is abdominal tenderness. There is no guarding or rebound.     Comments: reticular type lesion on the abdominal wall with palpable nodularity superficially, measures over 10cm wide, circular in nature over right abdomen (see photo)   Musculoskeletal:        General: No swelling. Normal range of motion.     Cervical back: Normal range of motion.  Skin:    General: Skin is warm.  Neurological:     General: No focal deficit present.     Mental Status: He is alert and oriented to person, place, and time.  Psychiatric:        Behavior: Behavior normal.        Thought Content: Thought content normal.        Judgment: Judgment normal.       Results: Personally reviewed- gallstones, no obvious subcutaneous lesions on the CT but on [place] 2cm lesion on [place] in the right abdomen noted  CLINICAL DATA:  Right lower quadrant pain  EXAM: CT ABDOMEN AND PELVIS WITH CONTRAST  TECHNIQUE: Multidetector CT imaging of the abdomen and pelvis was performed using the standard protocol following bolus administration of intravenous contrast.  CONTRAST:  127mL OMNIPAQUE IOHEXOL 300 MG/ML  SOLN  COMPARISON:  2018  FINDINGS: Lower chest: Mild atelectasis/scarring at the lung bases.  Hepatobiliary: No focal liver abnormality is seen. No gallstones or biliary dilatation. Minimal infiltration of the pericholecystic fat. Possible mild wall thickening.  Pancreas: Unremarkable.  Spleen: Unremarkable.  Adrenals/Urinary Tract: Adrenals, kidneys, and bladder are unremarkable.  Stomach/Bowel: Stomach is within normal limits. Bowel is normal in caliber.  Appendix is normal in caliber extending superiorly toward the gallbladder  Vascular/Lymphatic: No significant vascular abnormality. No enlarged lymph nodes.  Reproductive: Normal prostate.  Other: No free fluid.  Fat containing right inguinal hernia.  Musculoskeletal: Degenerative changes of the included spine particularly at L3-L4.  IMPRESSION: Normal appendix.  Possible mild gallbladder wall thickening with minimal infiltration of the surrounding fat. Right upper quadrant ultrasound is suggested.   Electronically Signed   By: Macy Mis M.D.   On: 09/17/2020 13:50  CLINICAL DATA:  Right upper quadrant pain since yesterday.  EXAM: ULTRASOUND ABDOMEN  LIMITED RIGHT UPPER QUADRANT  COMPARISON:  CT performed earlier today  FINDINGS: Gallbladder:  Stones identified within the gallbladder which measure up to 2.2 cm. No signs of gallbladder wall thickening or pericholecystic fluid. Negative sonographic Murphy's sign.  Common bile duct:  Diameter: 4 mm  Liver:  Increase parenchymal echogenicity compatible with hepatic steatosis. Portal vein is patent on color Doppler imaging with normal direction of blood flow towards the liver.  Other: There is a small well-circumscribed echogenic structure within the subcutaneous soft tissues of the ventral abdominal wall measuring 1.9 x 1.1 x 2.2 cm. No cyst convincing evidence for a CT correlate the on exam performed earlier today.  IMPRESSION: 1. Gallstones.  No secondary signs of acute cholecystitis. 2. Echogenic liver suggestive of hepatic steatosis. 3. Incidental finding of small well-circumscribed hyperechoic structure within the subcutaneous soft tissues of the right ventral abdominal wall. No corresponding CT abnormality identified on exam performed earlier today.   Electronically Signed   By: Kerby Moors M.D.   On: 09/17/2020 15:01  Assessment & Plan:  Sartaj Hoskin is a 50 y.o.  male with gallstones and an unusual lesion on his abdominal wall that could be a livedo reticularis and warrant more workup, versus erythema ab igne although he has not history of using a heating pad.  I have discussed with him that I do not know what this is and doing an incisional biopsy for diagnosis obtaining skin and subcutaneous tissue.   Discussed risk of bleeding, infection, not healing, finding something like cancer.   PLAN: I counseled the patient about the indication, risks and benefits of laparoscopic cholecystectomy.  He understands there is a very small chance for bleeding, infection, injury to normal structures (including common bile duct), conversion to open surgery, persistent symptoms, evolution of postcholecystectomy diarrhea, need for secondary interventions, anesthesia reaction, cardiopulmonary issues and other risks not specifically detailed here. I described the expected recovery, the plan for follow-up and the restrictions during the recovery phase.  All questions were answered.  Discussed preop COVID testing. At the time of the lap chole will take an ellipse incisional biopsy of this area.   All questions were answered to the satisfaction of the patient.    Virl Cagey 09/26/2020, 1:31 PM

## 2020-09-26 NOTE — Patient Instructions (Addendum)
Cholelithiasis  Cholelithiasis happens when gallstones form in the gallbladder. The gallbladder stores bile. Bile is a fluid that helps digest fats. Bile can harden and form into gallstones. If they cause a blockage, they can cause pain (gallbladder attack). What are the causes? This condition may be caused by:  Some blood diseases, such as sickle cell anemia.  Too much of a fat-like substance (cholesterol) in your bile.  Not enough bile salts in your bile. These salts help the body absorb and digest fats.  The gallbladder not emptying fully or often enough. This is common in pregnant women. What increases the risk? The following factors may make you more likely to develop this condition:  Being male.  Being pregnant many times.  Eating a lot of fried foods, fat, and refined carbs (refined carbohydrates).  Being very overweight (obese).  Being older than age 66.  Using medicines with male hormones in them for a long time.  Losing weight fast.  Having gallstones in your family.  Having some health problems, such as diabetes, Crohn's disease, or liver disease. What are the signs or symptoms? Often, there may be gallstones but no symptoms. These gallstones are called silent gallstones. If a gallstone causes a blockage, you may get sudden pain. The pain:  Can be in the upper right part of your belly (abdomen).  Normally comes at night or after you eat.  Can last an hour or more.  Can spread to your right shoulder, back, or chest.  Can feel like discomfort, burning, or fullness in the upper part of your belly (indigestion). If the blockage lasts more than a few hours, you can get an infection or swelling. You may:  Feel like you may vomit.  Vomit.  Feel bloated.  Have belly pain for 5 hours or more.  Feel tender in your belly, often in the upper right part and under your ribs.  Have fever or chills.  Have skin or the white parts of your eyes turn yellow  (jaundice).  Have dark pee (urine) or pale poop (stool). How is this treated? Treatment for this condition depends on how bad you feel. If you have symptoms, you may need:  Home care, if symptoms are not very bad. ? Do not eat for 12-24 hours. Drink only water and clear liquids. ? Start to eat simple or clear foods after 1 or 2 days. Try broths and crackers. ? You may need medicines for pain or stomach upset or both. ? If you have an infection, you will need antibiotics.  A hospital stay, if you have very bad pain or a very bad infection.  Surgery to remove your gallbladder. You may need this if: ? Gallstones keep coming back. ? You have very bad symptoms.  Medicines to break up gallstones. Medicines: ? Are best for small gallstones. ? May be used for up to 6-12 months.  A procedure to find and take out gallstones or to break up gallstones. Follow these instructions at home: Medicines  Take over-the-counter and prescription medicines only as told by your doctor.  If you were prescribed an antibiotic medicine, take it as told by your doctor. Do not stop taking the antibiotic even if you start to feel better.  Ask your doctor if the medicine prescribed to you requires you to avoid driving or using machinery. Eating and drinking  Drink enough fluid to keep your urine pale yellow. Drink water or clear fluids. This is important when you have pain.  Eat  healthy foods. Choose: ? Fewer fatty foods, such as fried foods. ? Fewer refined carbs. Avoid breads and grains that are highly processed, such as white bread and white rice. Choose whole grains, such as whole-wheat bread and brown rice. ? More fiber. Almonds, fresh fruit, and beans are healthy sources. General instructions  Keep a healthy weight.  Keep all follow-up visits as told by your doctor. This is important. Where to find more information  [employer] of Diabetes and Digestive and Kidney Diseases:  [email or web address] Contact a doctor if:  You have sudden pain in the upper right part of your belly. Pain might spread to your right shoulder, back, or chest.  You have been diagnosed with gallstones that have no symptoms and you get: ? Belly pain. ? Discomfort, burning, or fullness in the upper part of your abdomen.  You have dark urine or pale stools. Get help right away if:  You have sudden pain in the upper right part of your abdomen, and the pain lasts more than 2 hours.  You have pain in your abdomen, and: ? It lasts more than 5 hours. ? It keeps getting worse.  You have a fever or chills.  You keep feeling like you may vomit.  You keep vomiting.  Your skin or the white parts of your eyes turn yellow. Summary  Cholelithiasis happens when gallstones form in the gallbladder.  This condition may be caused by a blood disease, too much of a fat-like substance in the bile, or not enough bile salts in bile.  Treatment for this condition depends on how bad you feel.  If you have symptoms, do not eat or drink. You may need medicines. You may need a hospital stay for very bad pain or a very bad infection.  You may need surgery if gallstones keep coming back or if you have very bad symptoms. This information is not intended to replace advice given to you by your health care provider. Make sure you discuss any questions you have with your health care provider. Document Revised: 08/11/2019 Document Reviewed: 05/15/2019 Elsevier Patient Education  2021 [employer].    Minimally Invasive Cholecystectomy Minimally invasive cholecystectomy is surgery to remove the gallbladder. The gallbladder is a pear-shaped organ that lies beneath the liver on the right side of the body. The gallbladder stores bile, which is a fluid that helps the body digest fats. Cholecystectomy is often done to treat inflammation of the gallbladder (cholecystitis). This condition is usually caused by a buildup  of gallstones (cholelithiasis) in the gallbladder. Gallstones can block the flow of bile, which can result in inflammation and pain. In severe cases, emergency surgery may be required. This procedure is done though small incisions in the abdomen, instead of one large incision. It is also called laparoscopic surgery. A thin scope with a camera (laparoscope) is inserted through one incision. Then surgical instruments are inserted through the other incisions. In some cases, a minimally invasive surgery may need to be changed to a surgery that is done through a larger incision. This is called open surgery. Tell a health care provider about:  Any allergies you have.  All medicines you are taking, including vitamins, herbs, eye drops, creams, and over-the-counter medicines.  Any problems you or family members have had with anesthetic medicines.  Any blood disorders you have.  Any surgeries you have had.  Any medical conditions you have.  Whether you are pregnant or may be pregnant. What are the risks? Generally, this is  a safe procedure. However, problems may occur, including:  Infection.  Bleeding.  Allergic reactions to medicines.  Damage to nearby structures or organs.  A stone remaining in the common bile duct. The common bile duct carries bile from the gallbladder into the small intestine.  A bile leak from the cyst duct that is clipped when your gallbladder is removed. Medicines Ask your health care provider about:  Changing or stopping your regular medicines. This is especially important if you are taking diabetes medicines or blood thinners.  Taking medicines such as aspirin and ibuprofen. These medicines can thin your blood. Do not take these medicines unless your health care provider tells you to take them.  Taking over-the-counter medicines, vitamins, herbs, and supplements. General instructions  Let your health care provider know if you develop a cold or an infection  before surgery.  Plan to have someone take you home from the hospital or clinic.  If you will be going home right after the procedure, plan to have someone with you for 24 hours.  Ask your health care provider: ? How your surgery site will be marked. ? What steps will be taken to help prevent infection. These may include:  Removing hair at the surgery site.  Washing skin with a germ-killing soap.  Taking antibiotic medicine. What happens during the procedure?  An IV will be inserted into one of your veins.  You will be given one or both of the following: ? A medicine to help you relax (sedative). ? A medicine to make you fall asleep (general anesthetic).  A breathing tube will be placed in your mouth.  Your surgeon will make several small incisions in your abdomen.  The laparoscope will be inserted through one of the small incisions. The camera on the laparoscope will send images to a monitor in the operating room. This lets your surgeon see inside your abdomen.  A gas will be pumped into your abdomen. This will expand your abdomen to give the surgeon more room to perform the surgery.  Other tools that are needed for the procedure will be inserted through the other incisions. The gallbladder will be removed through one of the incisions.  Your common bile duct may be examined. If stones are found in the common bile duct, they may be removed.  After your gallbladder has been removed, the incisions will be closed with stitches (sutures), staples, or skin glue.  Your incisions may be covered with a bandage (dressing). The procedure may vary among health care providers and hospitals.   What happens after the procedure?  Your blood pressure, heart rate, breathing rate, and blood oxygen level will be monitored until you leave the hospital or clinic.  You will be given medicines as needed to control your pain.  If you were given a sedative during the procedure, it can affect you  for several hours. Do not drive or operate machinery until your health care provider says that it is safe. Summary  Minimally invasive cholecystectomy, also called laparoscopic cholecystectomy, is surgery to remove the gallbladder using small incisions.  Tell your health care provider about all the medical conditions you have and all the medicines you are taking for those conditions.  Before the procedure, follow instructions about eating or drinking restrictions and changing or stopping medicines.  If you were given a sedative during the procedure, it can affect you for several hours. Do not drive or operate machinery until your health care provider says that it is safe.  This information is not intended to replace advice given to you by your health care provider. Make sure you discuss any questions you have with your health care provider. Document Revised: 03/27/2019 Document Reviewed: 03/27/2019 Elsevier Patient Education  [place].

## 2020-09-30 NOTE — H&P (Signed)
[facility] History and Physical  Reason for Referral: Gallstones  Referring Physician:  ED   Chief Complaint    New Patient (Initial Visit)      Steve Romero is a 50 y.o. male.  HPI:  Steve Romero is a 50 yo who says that he has been having RUQ pain and some nausea/bloating for years and he knew he had gallstones but that in the last few weeks it has become worse and more symptomatic.  He says that he went to the ED and was evaluated with a CT and [place] and they found the stones but normal labs. He says that the pain is worse with fatty foods and can be as high as 7/10.  He was also noted to have a discoloration/ nodularity of the abdominal wall in the ED. He says that this has been present for at least 4 months. He denies any hot pack/ heating pad use to the area. He says it is tender somewhat. He does not have any other lesions like it on the body.    Past Medical History:  Diagnosis Date  . Anxiety 10-2014  . Bipolar disorder ([place]) age 49  . COPD (chronic obstructive pulmonary disease) ([place]) 2016  . Depression age 25  . Enlarged prostate   . Hyperlipidemia 2016  . Hypertension 2013  . Schizoaffective disorder ([place]) 11-13-14  . Sleep apnea   . Thyroid disease 11-2014    Past Surgical History:  Procedure Laterality Date  . CARPAL TUNNEL RELEASE Left 02/21/2016   Procedure: CARPAL TUNNEL RELEASE;  Surgeon: Carole Civil, MD;  Location: [facility];  Service: Orthopedics;  Laterality: Left;  . HERNIA REPAIR Left 2002   groin  . INGUINAL HERNIA REPAIR Left 04/05/2017   Procedure: RECURRENT HERNIA REPAIR INGUINAL ADULT WITH MESH;  Surgeon: [name], MD;  Location: [facility];  Service: General;  Laterality: Left;  Marland Kitchen MASS EXCISION Right 04/29/2020   Procedure: EXCISION MASS RIGHT WRIST;  Surgeon: [name], MD;  Location: [place];  Service: Orthopedics;  Laterality: Right;    Family History  Problem Relation Age of Onset  . Diabetes Father    . Heart disease Father   . Heart disease Maternal Grandmother   . Stroke Maternal Grandfather     Social History   Tobacco Use  . Smoking status: Current Every Day Smoker    Packs/day: 0.25    Years: 26.00    Pack years: 6.50    Types: Cigarettes  . Smokeless tobacco: Never Used  Vaping Use  . Vaping Use: Never used  Substance Use Topics  . Alcohol use: No  . Drug use: No    Medications: I have reviewed the patient's current medications. Allergies as of 09/26/2020      Reactions   Desipramine Nausea Only, Rash      Medication List       Accurate as of September 26, 2020  1:31 PM. If you have any questions, ask your nurse or doctor.        HYDROcodone-acetaminophen 5-325 MG tablet Commonly known as: Norco Take 1 tablet by mouth every 6 (six) hours as needed for up to 5 days for moderate pain.   ondansetron 4 MG disintegrating tablet Commonly known as: Zofran ODT Take 1 tablet (4 mg total) by mouth every 8 (eight) hours as needed.   risperiDONE 1 MG tablet Commonly known as: RISPERDAL Take 1 mg by mouth at bedtime.   senna-docusate 8.6-50 MG tablet  Commonly known as: Senokot-S Take 1 tablet by mouth at bedtime as needed for mild constipation or moderate constipation.   traZODone 150 MG tablet Commonly known as: DESYREL Take 2 tablets by mouth at bedtime.        ROS:  A comprehensive review of systems was negative except for: Respiratory: positive for SOB with ambulating around the block Gastrointestinal: positive for abdominal pain and nausea Musculoskeletal: positive for back pain and neck pain  Blood pressure (!) 153/102, pulse 89, temperature 98.1 F (36.7 C), temperature source Other (Comment), resp. rate 20, height 5\' 10"  (1.778 m), weight 281 lb (127.5 kg), SpO2 97 %. Physical Exam Vitals reviewed.  Constitutional:      Appearance: He is obese.  HENT:     Head: Normocephalic.     Nose: Nose normal.     Mouth/Throat:     Mouth: Mucous membranes  are moist.  Eyes:     Extraocular Movements: Extraocular movements intact.  Cardiovascular:     Rate and Rhythm: Normal rate and regular rhythm.  Pulmonary:     Effort: Pulmonary effort is normal.     Breath sounds: Normal breath sounds.  Abdominal:     General: There is no distension.     Palpations: Abdomen is soft.     Tenderness: There is abdominal tenderness. There is no guarding or rebound.     Comments: reticular type lesion on the abdominal wall with palpable nodularity superficially, measures over 10cm wide, circular in nature over right abdomen (see photo)   Musculoskeletal:        General: No swelling. Normal range of motion.     Cervical back: Normal range of motion.  Skin:    General: Skin is warm.  Neurological:     General: No focal deficit present.     Mental Status: He is alert and oriented to person, place, and time.  Psychiatric:        Behavior: Behavior normal.        Thought Content: Thought content normal.        Judgment: Judgment normal.       Results: Personally reviewed- gallstones, no obvious subcutaneous lesions on the CT but on [place] 2cm lesion on [place] in the right abdomen noted  CLINICAL DATA:  Right lower quadrant pain  EXAM: CT ABDOMEN AND PELVIS WITH CONTRAST  TECHNIQUE: Multidetector CT imaging of the abdomen and pelvis was performed using the standard protocol following bolus administration of intravenous contrast.  CONTRAST:  159mL OMNIPAQUE IOHEXOL 300 MG/ML  SOLN  COMPARISON:  2018  FINDINGS: Lower chest: Mild atelectasis/scarring at the lung bases.  Hepatobiliary: No focal liver abnormality is seen. No gallstones or biliary dilatation. Minimal infiltration of the pericholecystic fat. Possible mild wall thickening.  Pancreas: Unremarkable.  Spleen: Unremarkable.  Adrenals/Urinary Tract: Adrenals, kidneys, and bladder are unremarkable.  Stomach/Bowel: Stomach is within normal limits. Bowel is normal in caliber.  Appendix is normal in caliber extending superiorly toward the gallbladder  Vascular/Lymphatic: No significant vascular abnormality. No enlarged lymph nodes.  Reproductive: Normal prostate.  Other: No free fluid.  Fat containing right inguinal hernia.  Musculoskeletal: Degenerative changes of the included spine particularly at L3-L4.  IMPRESSION: Normal appendix.  Possible mild gallbladder wall thickening with minimal infiltration of the surrounding fat. Right upper quadrant ultrasound is suggested.   Electronically Signed   By: Macy Mis M.D.   On: 09/17/2020 13:50  CLINICAL DATA:  Right upper quadrant pain since yesterday.  EXAM: ULTRASOUND ABDOMEN  LIMITED RIGHT UPPER QUADRANT  COMPARISON:  CT performed earlier today  FINDINGS: Gallbladder:  Stones identified within the gallbladder which measure up to 2.2 cm. No signs of gallbladder wall thickening or pericholecystic fluid. Negative sonographic Murphy's sign.  Common bile duct:  Diameter: 4 mm  Liver:  Increase parenchymal echogenicity compatible with hepatic steatosis. Portal vein is patent on color Doppler imaging with normal direction of blood flow towards the liver.  Other: There is a small well-circumscribed echogenic structure within the subcutaneous soft tissues of the ventral abdominal wall measuring 1.9 x 1.1 x 2.2 cm. No cyst convincing evidence for a CT correlate the on exam performed earlier today.  IMPRESSION: 1. Gallstones.  No secondary signs of acute cholecystitis. 2. Echogenic liver suggestive of hepatic steatosis. 3. Incidental finding of small well-circumscribed hyperechoic structure within the subcutaneous soft tissues of the right ventral abdominal wall. No corresponding CT abnormality identified on exam performed earlier today.   Electronically Signed   By: Kerby Moors M.D.   On: 09/17/2020 15:01  Assessment & Plan:  Steve Romero is a 50 y.o.  male with gallstones and an unusual lesion on his abdominal wall that could be a livedo reticularis and warrant more workup, versus erythema ab igne although he has not history of using a heating pad.  I have discussed with him that I do not know what this is and doing an incisional biopsy for diagnosis obtaining skin and subcutaneous tissue.   Discussed risk of bleeding, infection, not healing, finding something like cancer.   PLAN: I counseled the patient about the indication, risks and benefits of laparoscopic cholecystectomy.  He understands there is a very small chance for bleeding, infection, injury to normal structures (including common bile duct), conversion to open surgery, persistent symptoms, evolution of postcholecystectomy diarrhea, need for secondary interventions, anesthesia reaction, cardiopulmonary issues and other risks not specifically detailed here. I described the expected recovery, the plan for follow-up and the restrictions during the recovery phase.  All questions were answered.  Discussed preop COVID testing. At the time of the lap chole will take an ellipse incisional biopsy of this area.   All questions were answered to the satisfaction of the patient.    Virl Cagey 09/26/2020, 1:31 PM

## 2020-10-02 NOTE — Patient Instructions (Signed)
Steve Romero  10/02/2020     @PREFPERIOPPHARMACY @   Your procedure is scheduled on  10/09/2020   Report to [facility] at  [place].M.   Call this number if you have problems the morning of surgery:  [phone]   Remember:  Do not eat or drink after midnight.                         Take these medicines the morning of surgery with A SIP OF WATER  zofran (if needed).  Place clean sheets on your bed the night before your procedure and DO NOT sleep with pets this night.  Shower with CHG the night before and the morning of your procedure. DO NOT put CHG on your face, hair or genitals.  After each shower, dry off with a clean towel, put on clean, comfortable clothes and brush your teeth.     Do not wear jewelry, make-up or nail polish.  Do not wear lotions, powders, or perfumes, or deodorant.  Do not shave 48 hours prior to surgery.  Men may shave face and neck.  Do not bring valuables to the hospital.  [facility] is not responsible for any belongings or valuables.   Contacts, dentures or bridgework may not be worn into surgery.  Leave your suitcase in the car.  After surgery it may be brought to your room.  For patients admitted to the hospital, discharge time will be determined by your treatment team.  Patients discharged the day of surgery will not be allowed to drive home and must have someone with them for 24 hours.   Special instructions:  DO NOT smoke tobacco or vape the morning of your procedure.    Please read over the following fact sheets that you were given. Coughing and Deep Breathing, Surgical Site Infection Prevention, Anesthesia Post-op Instructions and Care and Recovery After Surgery       Minimally Invasive Cholecystectomy, Care After This sheet gives you information about how to care for yourself after your procedure. Your health care provider may also give you more specific instructions. If you have problems or questions, contact your  health care provider. What can I expect after the procedure? After the procedure, it is common to have:  Pain at your incision sites. You will be given medicines to control this pain.  Mild nausea or vomiting.  Bloating and possible shoulder pain from the gas that was used during the procedure. Follow these instructions at home: Medicines  Take over-the-counter and prescription medicines only as told by your health care provider.  If you were prescribed an antibiotic medicine, take or use it as told by your health care provider. Do not stop using the antibiotic even if you start to feel better.  Ask your health care provider if the medicine prescribed to you: ? Requires you to avoid driving or using machinery. ? Can cause constipation. You may need to take these actions to prevent or treat constipation:  Drink enough fluid to keep your urine pale yellow.  Take over-the-counter or prescription medicines.  Eat foods that are high in fiber, such as beans, whole grains, and fresh fruits and vegetables.  Limit foods that are high in fat and processed sugars, such as fried or sweet foods. Incision care  Follow instructions from your health care provider about how to take care of your incisions. Make sure you: ? Wash your hands with  soap and water for at least 20 seconds before and after you change your bandage (dressing). If soap and water are not available, use hand sanitizer. ? Change your dressing as told by your health care provider. ? Leave stitches (sutures), skin glue, or adhesive strips in place. These skin closures may need to be in place for 2 weeks or longer. If adhesive strip edges start to loosen and curl up, you may trim the loose edges. Do not remove adhesive strips completely unless your health care provider tells you to do that.  Do not take baths, swim, or use a hot tub until your health care provider approves. Ask your health care provider if you may take showers. You  may only be allowed to take sponge baths.  Check your incision area every day for signs of infection. Check for: ? More redness, swelling, or pain. ? Fluid or blood. ? Warmth. ? Pus or a bad smell.   Activity  Rest as told by your health care provider.  Avoid sitting for a long time without moving. Get up to take short walks every 1-2 hours. This is important to improve blood flow and breathing. Ask for help if you feel weak or unsteady.  Do not lift anything that is heavier than 10 lb (4.5 kg), or the limit that you are told, until your health care provider says that it is safe.  Do not play contact sports until your health care provider approves.  Do not return to work or school until your health care provider approves.  Return to your normal activities as told by your health care provider. Ask your health care provider what activities are safe for you. General instructions  If you were given a sedative during the procedure, it can affect you for several hours. Do not drive or operate machinery until your health care provider says that it is safe.  Keep all follow-up visits as told by your health care provider. This is important. Contact a health care provider if:  You develop a rash.  You have more redness, swelling, or pain around your incisions.  You have fluid or blood coming from your incisions.  Your incisions feel warm to the touch.  You have pus or a bad smell coming from your incisions.  You have a fever.  One or more of your incisions breaks open. Get help right away if:  You have trouble breathing.  You have chest pain.  You have increasing pain in your shoulders.  You faint or feel dizzy when you stand.  You have severe pain in your abdomen.  You have nausea or vomiting that lasts for more than one day.  You have leg pain. Summary  After your procedure, it is common to have pain at the incision sites. You may also have nausea or  bloating.  Follow your health care provider's instructions about medicine, activity restrictions, and caring for your incision areas. Do not do activities that require a lot of effort.  Contact a health care provider if you have a fever or other signs of infection, such as more redness, swelling, or pain around the incisions.  Get help right away if you have chest pain, increasing pain in the shoulders, or trouble breathing. This information is not intended to replace advice given to you by your health care provider. Make sure you discuss any questions you have with your health care provider. Document Revised: 03/22/2019 Document Reviewed: 03/22/2019 Elsevier Patient Education  [place].  Incision Care, Adult An incision is a cut that a doctor makes in your skin for surgery. Most times, these cuts are closed after surgery. Your cut from surgery may be closed with:  Stitches (sutures).  Staples.  Skin glue.  Skin tape (adhesive) strips. You may need to return to your doctor to have stitches or staples taken out. This may happen many days or many weeks after your surgery. You need to take good care of your cut so it does not get infected. Follow instructions from your doctor about how to care for your cut. Supplies needed:  Soap and water.  A clean hand towel.  Wound cleanser.  A clean bandage (dressing), if needed.  Cream or ointment, if told by your doctor.  Clean gauze. How to care for your cut from surgery Cleaning your cut Ask your doctor how to clean your cut. You may need to:  Use mild soap and water, or a wound cleanser.  Use a clean gauze to pat your cut dry after you clean it. Changing your bandage  Wash your hands with soap and water for at least 20 seconds before and after you change your bandage. If you cannot use soap and water, use hand sanitizer.  Change your bandage as told by your doctor.  Leave stitches, staples, skin glue, or skin tape  strips in place. They may need to stay in place for 2 weeks or longer. If tape strips get loose and curl up, you may trim the loose edges. Do not remove tape strips completely unless your doctor says it is okay.  Put a cream or ointment on your cut. Do this only as told.  Cover your cut with a clean bandage.  Ask your doctor when you can leave your cut uncovered. Checking for infection Check your cut area every day for signs of infection. Check for:  More redness, swelling, or pain.  More fluid or blood.  Warmth.  Pus or a bad smell.   Follow these instructions at home Medicines  Take over-the-counter and prescription medicines only as told by your doctor.  If you were prescribed an antibiotic medicine, cream, or ointment, use it as told by your doctor. Do not stop using the antibiotic even if your condition improves. Eating and drinking  Eat foods that have a lot of certain nutrients, such as protein, vitamin A, and vitamin C. These foods help your cut heal. ? Foods rich in protein include meat, fish, eggs, dairy, beans, and nuts. ? Foods rich in vitamin A include carrots and dark green, leafy vegetables. ? Foods rich in vitamin C include citrus fruits, tomatoes, broccoli, and peppers.  Drink enough fluid to keep your pee (urine) pale yellow. General instructions  Do not take baths, swim, use a hot tub, or put your cut underwater until your doctor approves. Ask your doctor if you may take showers. You may only be allowed to take sponge baths.  Limit movement around your cut. This helps with healing. ? Try not to strain, lift, or exercise for the first 2 weeks, or for as long as told by your doctor. ? Return to your normal activities as told by your doctor. Ask your doctor what activities are safe for you.  Do not scratch or pick at your cut. Keep it covered as told by your doctor.  Protect your cut from the sun when you are outside for the first 6 months, or for as long as  told by your doctor. Cover  up the scar area or put on sunscreen that has an SPF of at least 30.  Do not use any products that contain nicotine or tobacco, such as cigarettes, e-cigarettes, and chewing tobacco. These can delay cut healing after surgery. If you need help quitting, ask your doctor.  Keep all follow-up visits as told by your doctor. This is important.   Contact a doctor if:  You have any of these signs of infection around your cut: ? More redness, swelling, or pain. ? More fluid or blood. ? Warmth. ? Pus or a bad smell.  You have a fever.  You feel like you may vomit (nauseous).  You vomit.  You are dizzy.  Your stitches, staples, skin glue, or tape strips come undone. Get help right away if:  Your cut has a red streak coming from it.  Your cut bleeds through your bandage, and bleeding does not stop with gentle pressure.  Your cut opens up and comes apart.  Your body reacts very badly to an infection. This may include: ? A fever, chills, or feeling cold. ? Feeling mixed up, worried, or nervous. ? Very bad pain. ? Trouble breathing. ? A fast heartbeat. ? Clammy or sweaty skin. ? A rash. These symptoms may be an emergency. Do not wait to see if the symptoms will go away. Get medical help right away. Call your local emergency services (911 in the [place] Do not drive yourself to the hospital. Summary  Follow instructions from your doctor about how to care for your cut from surgery.  Wash your hands with soap and water for at least 20 seconds before and after you change your bandage. If you cannot use soap and water, use hand sanitizer.  Check your cut area every day for signs of infection.  Keep all follow-up visits as told by your doctor. This is important. This information is not intended to replace advice given to you by your health care provider. Make sure you discuss any questions you have with your health care provider. Document Revised: 04/12/2019  Document Reviewed: 04/12/2019 Elsevier Patient Education  2021 [place] Anesthesia, Adult, Care After This sheet gives you information about how to care for yourself after your procedure. Your health care provider may also give you more specific instructions. If you have problems or questions, contact your health care provider. What can I expect after the procedure? After the procedure, the following side effects are common:  Pain or discomfort at the IV site.  Nausea.  Vomiting.  Sore throat.  Trouble concentrating.  Feeling cold or chills.  Feeling weak or tired.  Sleepiness and fatigue.  Soreness and body aches. These side effects can affect parts of the body that were not involved in surgery. Follow these instructions at home: For the time period you were told by your health care provider:  Rest.  Do not participate in activities where you could fall or become injured.  Do not drive or use machinery.  Do not drink alcohol.  Do not take sleeping pills or medicines that cause drowsiness.  Do not make important decisions or sign legal documents.  Do not take care of children on your own.   Eating and drinking  Follow any instructions from your health care provider about eating or drinking restrictions.  When you feel hungry, start by eating small amounts of foods that are soft and easy to digest (bland), such as toast. Gradually return to your regular diet.  Drink enough fluid to keep your urine pale yellow.  If you vomit, rehydrate by drinking water, juice, or clear broth. General instructions  If you have sleep apnea, surgery and certain medicines can increase your risk for breathing problems. Follow instructions from your health care provider about wearing your sleep device: ? Anytime you are sleeping, including during daytime naps. ? While taking prescription pain medicines, sleeping medicines, or medicines that make you drowsy.  Have a  responsible adult stay with you for the time you are told. It is important to have someone help care for you until you are awake and alert.  Return to your normal activities as told by your health care provider. Ask your health care provider what activities are safe for you.  Take over-the-counter and prescription medicines only as told by your health care provider.  If you smoke, do not smoke without supervision.  Keep all follow-up visits as told by your health care provider. This is important. Contact a health care provider if:  You have nausea or vomiting that does not get better with medicine.  You cannot eat or drink without vomiting.  You have pain that does not get better with medicine.  You are unable to pass urine.  You develop a skin rash.  You have a fever.  You have redness around your IV site that gets worse. Get help right away if:  You have difficulty breathing.  You have chest pain.  You have blood in your urine or stool, or you vomit blood. Summary  After the procedure, it is common to have a sore throat or nausea. It is also common to feel tired.  Have a responsible adult stay with you for the time you are told. It is important to have someone help care for you until you are awake and alert.  When you feel hungry, start by eating small amounts of foods that are soft and easy to digest (bland), such as toast. Gradually return to your regular diet.  Drink enough fluid to keep your urine pale yellow.  Return to your normal activities as told by your health care provider. Ask your health care provider what activities are safe for you. This information is not intended to replace advice given to you by your health care provider. Make sure you discuss any questions you have with your health care provider. Document Revised: 03/07/2020 Document Reviewed: 10/05/2019 Elsevier Patient Education  2021 [employer].

## 2020-10-03 ENCOUNTER — Other Ambulatory Visit: Payer: Self-pay

## 2020-10-03 ENCOUNTER — Encounter (HOSPITAL_COMMUNITY)
Admission: RE | Admit: 2020-10-03 | Discharge: 2020-10-03 | Disposition: A | Discharge status: Home / Self Care | Payer: Medicaid Other | Source: Ambulatory Visit | Attending: General Surgery | Admitting: General Surgery

## 2020-10-07 ENCOUNTER — Encounter (HOSPITAL_COMMUNITY): Payer: Self-pay

## 2020-10-07 ENCOUNTER — Other Ambulatory Visit: Payer: Self-pay

## 2020-10-07 ENCOUNTER — Other Ambulatory Visit (HOSPITAL_COMMUNITY)
Admission: RE | Admit: 2020-10-07 | Discharge: 2020-10-07 | Disposition: A | Discharge status: Home / Self Care | Payer: Medicaid Other | Source: Ambulatory Visit | Attending: General Surgery | Admitting: General Surgery

## 2020-10-07 DIAGNOSIS — Z20822 Contact with and (suspected) exposure to covid-19: Secondary | ICD-10-CM | POA: Insufficient documentation

## 2020-10-07 DIAGNOSIS — Z01812 Encounter for preprocedural laboratory examination: Secondary | ICD-10-CM | POA: Diagnosis present

## 2020-10-08 LAB — SARS CORONAVIRUS 2 (TAT 6-24 HRS): SARS Coronavirus 2: NEGATIVE

## 2020-10-09 ENCOUNTER — Encounter (HOSPITAL_COMMUNITY): Payer: Self-pay | Admitting: General Surgery

## 2020-10-09 ENCOUNTER — Other Ambulatory Visit: Payer: Self-pay

## 2020-10-09 ENCOUNTER — Ambulatory Visit (HOSPITAL_COMMUNITY): Payer: Medicaid Other | Admitting: Certified Registered"

## 2020-10-09 ENCOUNTER — Inpatient Hospital Stay (HOSPITAL_COMMUNITY)
Admission: RE | Admit: 2020-10-09 | Discharge: 2020-10-15 | DRG: 417 | Disposition: A | Discharge status: Home / Self Care | Payer: Medicaid Other | Source: Ambulatory Visit | Attending: General Surgery | Admitting: General Surgery

## 2020-10-09 ENCOUNTER — Encounter (HOSPITAL_COMMUNITY)
Admission: RE | Disposition: A | Discharge status: Home / Self Care | Payer: Self-pay | Source: Ambulatory Visit | Attending: General Surgery

## 2020-10-09 DIAGNOSIS — K567 Ileus, unspecified: Secondary | ICD-10-CM | POA: Diagnosis not present

## 2020-10-09 DIAGNOSIS — R1901 Right upper quadrant abdominal swelling, mass and lump: Secondary | ICD-10-CM | POA: Diagnosis present

## 2020-10-09 DIAGNOSIS — T80818A Extravasation of other vesicant agent, initial encounter: Secondary | ICD-10-CM | POA: Diagnosis not present

## 2020-10-09 DIAGNOSIS — Z888 Allergy status to other drugs, medicaments and biological substances status: Secondary | ICD-10-CM

## 2020-10-09 DIAGNOSIS — K915 Postcholecystectomy syndrome: Secondary | ICD-10-CM | POA: Diagnosis not present

## 2020-10-09 DIAGNOSIS — G4730 Sleep apnea, unspecified: Secondary | ICD-10-CM | POA: Diagnosis present

## 2020-10-09 DIAGNOSIS — Z79899 Other long term (current) drug therapy: Secondary | ICD-10-CM

## 2020-10-09 DIAGNOSIS — F259 Schizoaffective disorder, unspecified: Secondary | ICD-10-CM | POA: Diagnosis present

## 2020-10-09 DIAGNOSIS — M62838 Other muscle spasm: Secondary | ICD-10-CM | POA: Diagnosis not present

## 2020-10-09 DIAGNOSIS — K8020 Calculus of gallbladder without cholecystitis without obstruction: Secondary | ICD-10-CM | POA: Diagnosis not present

## 2020-10-09 DIAGNOSIS — K810 Acute cholecystitis: Secondary | ICD-10-CM | POA: Diagnosis not present

## 2020-10-09 DIAGNOSIS — E785 Hyperlipidemia, unspecified: Secondary | ICD-10-CM | POA: Diagnosis present

## 2020-10-09 DIAGNOSIS — Z4659 Encounter for fitting and adjustment of other gastrointestinal appliance and device: Secondary | ICD-10-CM

## 2020-10-09 DIAGNOSIS — E079 Disorder of thyroid, unspecified: Secondary | ICD-10-CM | POA: Diagnosis present

## 2020-10-09 DIAGNOSIS — F419 Anxiety disorder, unspecified: Secondary | ICD-10-CM | POA: Diagnosis present

## 2020-10-09 DIAGNOSIS — D489 Neoplasm of uncertain behavior, unspecified: Secondary | ICD-10-CM | POA: Diagnosis present

## 2020-10-09 DIAGNOSIS — Y848 Other medical procedures as the cause of abnormal reaction of the patient, or of later complication, without mention of misadventure at the time of the procedure: Secondary | ICD-10-CM | POA: Diagnosis present

## 2020-10-09 DIAGNOSIS — K839 Disease of biliary tract, unspecified: Secondary | ICD-10-CM

## 2020-10-09 DIAGNOSIS — K859 Acute pancreatitis without necrosis or infection, unspecified: Secondary | ICD-10-CM | POA: Diagnosis not present

## 2020-10-09 DIAGNOSIS — J449 Chronic obstructive pulmonary disease, unspecified: Secondary | ICD-10-CM | POA: Diagnosis present

## 2020-10-09 DIAGNOSIS — Z8249 Family history of ischemic heart disease and other diseases of the circulatory system: Secondary | ICD-10-CM

## 2020-10-09 DIAGNOSIS — K8010 Calculus of gallbladder with chronic cholecystitis without obstruction: Principal | ICD-10-CM | POA: Diagnosis present

## 2020-10-09 DIAGNOSIS — R1011 Right upper quadrant pain: Secondary | ICD-10-CM

## 2020-10-09 DIAGNOSIS — L989 Disorder of the skin and subcutaneous tissue, unspecified: Secondary | ICD-10-CM | POA: Diagnosis present

## 2020-10-09 DIAGNOSIS — F319 Bipolar disorder, unspecified: Secondary | ICD-10-CM | POA: Diagnosis present

## 2020-10-09 DIAGNOSIS — R21 Rash and other nonspecific skin eruption: Secondary | ICD-10-CM | POA: Diagnosis present

## 2020-10-09 DIAGNOSIS — R52 Pain, unspecified: Secondary | ICD-10-CM

## 2020-10-09 DIAGNOSIS — D171 Benign lipomatous neoplasm of skin and subcutaneous tissue of trunk: Secondary | ICD-10-CM | POA: Diagnosis present

## 2020-10-09 DIAGNOSIS — F17210 Nicotine dependence, cigarettes, uncomplicated: Secondary | ICD-10-CM | POA: Diagnosis present

## 2020-10-09 DIAGNOSIS — N400 Enlarged prostate without lower urinary tract symptoms: Secondary | ICD-10-CM | POA: Diagnosis present

## 2020-10-09 DIAGNOSIS — I10 Essential (primary) hypertension: Secondary | ICD-10-CM | POA: Diagnosis present

## 2020-10-09 HISTORY — PX: CHOLECYSTECTOMY: SHX55

## 2020-10-09 HISTORY — PX: MASS EXCISION: SHX2000

## 2020-10-09 SURGERY — LAPAROSCOPIC CHOLECYSTECTOMY
Anesthesia: General | Laterality: Right

## 2020-10-09 MED ORDER — CHLORHEXIDINE GLUCONATE CLOTH 2 % EX PADS: 6.0000 | MEDICATED_PAD | Freq: Once | CUTANEOUS | Status: DC

## 2020-10-09 MED ORDER — LIDOCAINE HCL (PF) 2 % IJ SOLN
INTRAMUSCULAR | Status: AC
  Filled 2020-10-09: qty 5

## 2020-10-09 MED ORDER — KETOROLAC TROMETHAMINE 30 MG/ML IJ SOLN
INTRAMUSCULAR | Status: AC
  Filled 2020-10-09: qty 1

## 2020-10-09 MED ORDER — ORAL CARE MOUTH RINSE: 15.0000 mL | Freq: Once | OROMUCOSAL | Status: AC

## 2020-10-09 MED ORDER — DEXAMETHASONE SODIUM PHOSPHATE 10 MG/ML IJ SOLN
INTRAMUSCULAR | Status: DC | PRN
  Administered 2020-10-09: 8 mg via INTRAVENOUS

## 2020-10-09 MED ORDER — DIPHENHYDRAMINE HCL 50 MG/ML IJ SOLN: 12.5000 mg | Freq: Four times a day (QID) | INTRAMUSCULAR | Status: DC | PRN

## 2020-10-09 MED ORDER — ROCURONIUM BROMIDE 10 MG/ML (PF) SYRINGE
PREFILLED_SYRINGE | INTRAVENOUS | Status: DC | PRN
  Administered 2020-10-09: 10 mg via INTRAVENOUS
  Administered 2020-10-09: 60 mg via INTRAVENOUS

## 2020-10-09 MED ORDER — LIDOCAINE 2% (20 MG/ML) 5 ML SYRINGE
INTRAMUSCULAR | Status: DC | PRN
  Administered 2020-10-09: 20 mg via INTRAVENOUS
  Administered 2020-10-09: 80 mg via INTRAVENOUS

## 2020-10-09 MED ORDER — BUPIVACAINE HCL (PF) 0.5 % IJ SOLN
INTRAMUSCULAR | Status: AC
  Filled 2020-10-09: qty 30

## 2020-10-09 MED ORDER — BUPIVACAINE HCL (PF) 0.5 % IJ SOLN
INTRAMUSCULAR | Status: DC | PRN
  Administered 2020-10-09: 20 mL

## 2020-10-09 MED ORDER — SEVOFLURANE IN SOLN
RESPIRATORY_TRACT | Status: AC
  Filled 2020-10-09: qty 250

## 2020-10-09 MED ORDER — ACETAMINOPHEN 500 MG PO TABS
1000.0000 mg | ORAL_TABLET | Freq: Four times a day (QID) | ORAL | Status: DC
  Administered 2020-10-09 – 2020-10-13 (×10): 1000 mg via ORAL
  Filled 2020-10-09 (×10): qty 2

## 2020-10-09 MED ORDER — ROCURONIUM BROMIDE 10 MG/ML (PF) SYRINGE
PREFILLED_SYRINGE | INTRAVENOUS | Status: AC
  Filled 2020-10-09: qty 10

## 2020-10-09 MED ORDER — HEMOSTATIC AGENTS (NO CHARGE) OPTIME
TOPICAL | Status: DC | PRN
  Administered 2020-10-09: 1 via TOPICAL

## 2020-10-09 MED ORDER — FENTANYL CITRATE (PF) 100 MCG/2ML IJ SOLN
INTRAMUSCULAR | Status: AC
  Filled 2020-10-09: qty 2

## 2020-10-09 MED ORDER — DEXAMETHASONE SODIUM PHOSPHATE 10 MG/ML IJ SOLN
INTRAMUSCULAR | Status: AC
  Filled 2020-10-09: qty 1

## 2020-10-09 MED ORDER — SODIUM CHLORIDE 0.9 % IR SOLN
Status: DC | PRN
  Administered 2020-10-09: 1000 mL

## 2020-10-09 MED ORDER — MIDAZOLAM HCL 5 MG/5ML IJ SOLN
INTRAMUSCULAR | Status: DC | PRN
  Administered 2020-10-09: 2 mg via INTRAVENOUS

## 2020-10-09 MED ORDER — DOCUSATE SODIUM 100 MG PO CAPS
100.0000 mg | ORAL_CAPSULE | Freq: Two times a day (BID) | ORAL | Status: DC
  Administered 2020-10-09 – 2020-10-12 (×7): 100 mg via ORAL
  Filled 2020-10-09 (×8): qty 1

## 2020-10-09 MED ORDER — MIDAZOLAM HCL 2 MG/2ML IJ SOLN
INTRAMUSCULAR | Status: AC
  Filled 2020-10-09: qty 2

## 2020-10-09 MED ORDER — HYDROMORPHONE HCL 1 MG/ML IJ SOLN
0.2500 mg | INTRAMUSCULAR | Status: DC | PRN
  Administered 2020-10-09 (×3): 0.5 mg via INTRAVENOUS
  Filled 2020-10-09 (×3): qty 0.5

## 2020-10-09 MED ORDER — CHLORHEXIDINE GLUCONATE 0.12 % MT SOLN
15.0000 mL | Freq: Once | OROMUCOSAL | Status: AC
  Administered 2020-10-09: 15 mL via OROMUCOSAL

## 2020-10-09 MED ORDER — SODIUM CHLORIDE 0.9 % IV SOLN
2.0000 g | INTRAVENOUS | Status: DC
  Administered 2020-10-09 – 2020-10-11 (×3): 2 g via INTRAVENOUS
  Filled 2020-10-09 (×4): qty 20

## 2020-10-09 MED ORDER — ZOLPIDEM TARTRATE 5 MG PO TABS
5.0000 mg | ORAL_TABLET | Freq: Every evening | ORAL | Status: DC | PRN
  Filled 2020-10-09: qty 1

## 2020-10-09 MED ORDER — FENTANYL CITRATE (PF) 250 MCG/5ML IJ SOLN
INTRAMUSCULAR | Status: AC
  Filled 2020-10-09: qty 5

## 2020-10-09 MED ORDER — ONDANSETRON HCL 4 MG/2ML IJ SOLN: 4.0000 mg | Freq: Once | INTRAMUSCULAR | Status: DC | PRN

## 2020-10-09 MED ORDER — SUGAMMADEX SODIUM 200 MG/2ML IV SOLN
INTRAVENOUS | Status: DC | PRN
  Administered 2020-10-09: 300 mg via INTRAVENOUS

## 2020-10-09 MED ORDER — LACTATED RINGERS IV SOLN
INTRAVENOUS | Status: DC
  Administered 2020-10-09: 1000 mL via INTRAVENOUS

## 2020-10-09 MED ORDER — FENTANYL CITRATE (PF) 100 MCG/2ML IJ SOLN
INTRAMUSCULAR | Status: DC | PRN
  Administered 2020-10-09: 50 ug via INTRAVENOUS
  Administered 2020-10-09: 100 ug via INTRAVENOUS
  Administered 2020-10-09 (×4): 50 ug via INTRAVENOUS

## 2020-10-09 MED ORDER — METOPROLOL TARTRATE 5 MG/5ML IV SOLN
5.0000 mg | Freq: Four times a day (QID) | INTRAVENOUS | Status: DC | PRN
  Administered 2020-10-11 (×2): 5 mg via INTRAVENOUS
  Filled 2020-10-09 (×2): qty 5

## 2020-10-09 MED ORDER — ONDANSETRON HCL 4 MG/2ML IJ SOLN
INTRAMUSCULAR | Status: AC
  Filled 2020-10-09: qty 2

## 2020-10-09 MED ORDER — OXYCODONE HCL 5 MG PO TABS
5.0000 mg | ORAL_TABLET | ORAL | Status: DC | PRN
  Administered 2020-10-09 (×2): 5 mg via ORAL
  Administered 2020-10-10 – 2020-10-15 (×8): 10 mg via ORAL
  Filled 2020-10-09 (×2): qty 2
  Filled 2020-10-09: qty 1
  Filled 2020-10-09: qty 2
  Filled 2020-10-09: qty 1
  Filled 2020-10-09 (×5): qty 2

## 2020-10-09 MED ORDER — LACTATED RINGERS IV SOLN: INTRAVENOUS | Status: DC

## 2020-10-09 MED ORDER — ACETAMINOPHEN 10 MG/ML IV SOLN
INTRAVENOUS | Status: AC
  Filled 2020-10-09: qty 100

## 2020-10-09 MED ORDER — PROPOFOL 10 MG/ML IV BOLUS
INTRAVENOUS | Status: DC | PRN
  Administered 2020-10-09: 200 mg via INTRAVENOUS

## 2020-10-09 MED ORDER — ACETAMINOPHEN 10 MG/ML IV SOLN
1000.0000 mg | Freq: Once | INTRAVENOUS | Status: AC
  Administered 2020-10-09: 1000 mg via INTRAVENOUS

## 2020-10-09 MED ORDER — PANTOPRAZOLE SODIUM 40 MG IV SOLR
40.0000 mg | Freq: Every day | INTRAVENOUS | Status: DC
  Administered 2020-10-09 – 2020-10-14 (×6): 40 mg via INTRAVENOUS
  Filled 2020-10-09 (×7): qty 40

## 2020-10-09 MED ORDER — ONDANSETRON HCL 4 MG/2ML IJ SOLN
INTRAMUSCULAR | Status: DC | PRN
  Administered 2020-10-09: 4 mg via INTRAVENOUS

## 2020-10-09 MED ORDER — SIMETHICONE 80 MG PO CHEW
40.0000 mg | CHEWABLE_TABLET | Freq: Four times a day (QID) | ORAL | Status: DC | PRN
  Administered 2020-10-09 – 2020-10-11 (×3): 40 mg via ORAL
  Filled 2020-10-09 (×4): qty 1

## 2020-10-09 MED ORDER — ONDANSETRON 4 MG PO TBDP: 4.0000 mg | ORAL_TABLET | Freq: Four times a day (QID) | ORAL | Status: DC | PRN

## 2020-10-09 MED ORDER — SODIUM CHLORIDE 0.9 % IV SOLN
2.0000 g | INTRAVENOUS | Status: AC
  Administered 2020-10-09: 2 g via INTRAVENOUS

## 2020-10-09 MED ORDER — MORPHINE SULFATE (PF) 2 MG/ML IV SOLN
2.0000 mg | INTRAVENOUS | Status: DC | PRN
Start: 2020-10-09 — End: 2020-10-11
  Administered 2020-10-09 – 2020-10-10 (×7): 2 mg via INTRAVENOUS
  Filled 2020-10-09 (×7): qty 1

## 2020-10-09 MED ORDER — SODIUM CHLORIDE 0.9 % IV SOLN
INTRAVENOUS | Status: AC
  Filled 2020-10-09: qty 2

## 2020-10-09 MED ORDER — 0.9 % SODIUM CHLORIDE (POUR BTL) OPTIME
TOPICAL | Status: DC | PRN
  Administered 2020-10-09: 1000 mL

## 2020-10-09 MED ORDER — DIPHENHYDRAMINE HCL 12.5 MG/5ML PO ELIX
12.5000 mg | ORAL_SOLUTION | Freq: Four times a day (QID) | ORAL | Status: DC | PRN
  Filled 2020-10-09: qty 5

## 2020-10-09 MED ORDER — ONDANSETRON HCL 4 MG/2ML IJ SOLN: 4.0000 mg | Freq: Four times a day (QID) | INTRAMUSCULAR | Status: DC | PRN

## 2020-10-09 MED ORDER — PROPOFOL 10 MG/ML IV BOLUS
INTRAVENOUS | Status: AC
  Filled 2020-10-09: qty 20

## 2020-10-09 MED ORDER — SODIUM CHLORIDE 0.9 % IV SOLN: INTRAVENOUS | Status: DC

## 2020-10-09 SURGICAL SUPPLY — 58 items
APPLICATOR CHLORAPREP 10.5 ORG (MISCELLANEOUS) ×3 IMPLANT
APPLIER CLIP ROT 10 11.4 M/L (STAPLE) ×3
BAG RETRIEVAL 10 (BASKET) ×2
BLADE SURG 15 STRL LF DISP TIS (BLADE) ×2 IMPLANT
BLADE SURG 15 STRL SS (BLADE) ×3
CHLORAPREP W/TINT 26 (MISCELLANEOUS) ×3 IMPLANT
CLIP APPLIE ROT 10 11.4 M/L (STAPLE) ×2 IMPLANT
CLOTH BEACON ORANGE TIMEOUT ST (SAFETY) ×3 IMPLANT
COVER LIGHT HANDLE STERIS (MISCELLANEOUS) ×6 IMPLANT
COVER WAND RF STERILE (DRAPES) ×3 IMPLANT
DECANTER SPIKE VIAL GLASS SM (MISCELLANEOUS) ×3 IMPLANT
DERMABOND ADVANCED (GAUZE/BANDAGES/DRESSINGS) ×1
DERMABOND ADVANCED .7 DNX12 (GAUZE/BANDAGES/DRESSINGS) ×2 IMPLANT
DISSECTOR BLUNT TIP ENDO 5MM (MISCELLANEOUS) ×3 IMPLANT
DRAPE EENT ADH APERT 31X51 STR (DRAPES) ×3 IMPLANT
ELECT REM PT RETURN 9FT ADLT (ELECTROSURGICAL) ×3
ELECTRODE REM PT RTRN 9FT ADLT (ELECTROSURGICAL) ×2 IMPLANT
EVACUATOR DRAINAGE 10X20 100CC (DRAIN) ×2 IMPLANT
EVACUATOR SILICONE 100CC (DRAIN) ×3
GAUZE SPONGE 4X4 12PLY STRL (GAUZE/BANDAGES/DRESSINGS) ×3 IMPLANT
GLOVE SS BIOGEL STRL SZ 7.5 (GLOVE) ×2 IMPLANT
GLOVE SUPERSENSE BIOGEL SZ 7.5 (GLOVE) ×1
GLOVE SURG ENC MOIS LTX SZ6.5 (GLOVE) ×3 IMPLANT
GLOVE SURG UNDER POLY LF SZ6.5 (GLOVE) ×9 IMPLANT
GLOVE SURG UNDER POLY LF SZ7 (GLOVE) ×9 IMPLANT
GOWN STRL REUS W/TWL LRG LVL3 (GOWN DISPOSABLE) ×9 IMPLANT
HEMOSTAT SNOW SURGICEL 2X4 (HEMOSTASIS) ×3 IMPLANT
INST SET LAPROSCOPIC AP (KITS) ×3 IMPLANT
KIT TURNOVER KIT A (KITS) ×3 IMPLANT
MANIFOLD NEPTUNE II (INSTRUMENTS) ×3 IMPLANT
NEEDLE HYPO 25X1 1.5 SAFETY (NEEDLE) ×3 IMPLANT
NEEDLE INSUFFLATION 14GA 120MM (NEEDLE) ×3 IMPLANT
NS IRRIG 1000ML POUR BTL (IV SOLUTION) ×3 IMPLANT
PACK LAP CHOLE LZT030E (CUSTOM PROCEDURE TRAY) ×3 IMPLANT
PACK MINOR (CUSTOM PROCEDURE TRAY) IMPLANT
PAD ARMBOARD 7.5X6 YLW CONV (MISCELLANEOUS) ×3 IMPLANT
SET BASIN LINEN APH (SET/KITS/TRAYS/PACK) ×3 IMPLANT
SET TUBE IRRIG SUCTION NO TIP (IRRIGATION / IRRIGATOR) ×3 IMPLANT
SET TUBE SMOKE EVAC HIGH FLOW (TUBING) ×3 IMPLANT
SLEEVE ENDOPATH XCEL 5M (ENDOMECHANICALS) ×3 IMPLANT
SPONGE DRAIN TRACH 4X4 STRL 2S (GAUZE/BANDAGES/DRESSINGS) ×3 IMPLANT
STAPLER VISISTAT (STAPLE) ×3 IMPLANT
SUT ETHILON 3 0 FSL (SUTURE) IMPLANT
SUT ETHILON 3 0 PS 1 (SUTURE) ×3 IMPLANT
SUT MNCRL AB 4-0 PS2 18 (SUTURE) ×6 IMPLANT
SUT PROLENE 4 0 PS 2 18 (SUTURE) IMPLANT
SUT VIC AB 3-0 SH 27 (SUTURE)
SUT VIC AB 3-0 SH 27X BRD (SUTURE) IMPLANT
SUT VICRYL 0 UR6 27IN ABS (SUTURE) ×3 IMPLANT
SYR CONTROL 10ML LL (SYRINGE) ×3 IMPLANT
SYS BAG RETRIEVAL 10MM (BASKET) ×4
SYSTEM BAG RETRIEVAL 10MM (BASKET) ×4 IMPLANT
TAPE HYPAFIX 4 X10 (GAUZE/BANDAGES/DRESSINGS) ×3 IMPLANT
TROCAR ENDO BLADELESS 11MM (ENDOMECHANICALS) ×3 IMPLANT
TROCAR XCEL NON-BLD 5MMX100MML (ENDOMECHANICALS) ×3 IMPLANT
TROCAR XCEL UNIV SLVE 11M 100M (ENDOMECHANICALS) ×3 IMPLANT
TUBE CONNECTING 12X1/4 (SUCTIONS) ×3 IMPLANT
WARMER LAPAROSCOPE (MISCELLANEOUS) ×3 IMPLANT

## 2020-10-09 NOTE — Op Note (Addendum)
Operative Note   Preoperative Diagnosis: Symptomatic cholelithiasis, reticular skin lesion / abdominal wall mass    Postoperative Diagnosis: Empyema of the gallbladder, reticular skin lesion/ abdominal wall mass, lateral edge of hepatic fossa with bile leak   Procedure(s) Performed: Laparoscopic cholecystectomy, incisional biopsy of skin and subcutaneous tissue    Surgeon: Lanell Matar. Constance Haw, MD   Assistants: [name], MD    Anesthesia: General endotracheal   Anesthesiologist: Louann Sjogren, MD    Specimens: Gallbladder    Estimated Blood Loss: Minimal    Blood Replacement: None    Complications: None    Operative Findings:  Gallbladder with purulent drainage, torn cystic duct, able to get 1 clip across the remnant of the cystic duct   Procedure: The patient was taken to the operating room and placed supine. General endotracheal anesthesia was induced. Intravenous antibiotics were administered per protocol. An orogastric tube positioned to decompress the stomach. The abdomen was prepared and draped in the usual sterile fashion.    Avoiding his reticular lesions the trocars were placed. supraumbilical incision was made and a Veress technique was utilized to achieve pneumoperitoneum to 15 mmHg with carbon dioxide. A 11 mm optiview port was placed through the supraumbilical region, and a 10 mm 0-degree operative laparoscope was introduced. The area underlying the trocar and Veress needle were inspected and without evidence of injury.  Remaining trocars were placed under direct vision. Two 5 mm ports were placed in the right abdomen, between the anterior axillary and midclavicular line.  A final 11 mm port was placed through the mid-epigastrium, near the falciform ligament.    The gallbladder was thickened and inflamed and full of stones. It tore just grasping it and purulence was evacuated, indicating an empyema of the gallbladder. The fundus was elevated cephalad and the  infundibulum was retracted to the patient's right. There was significant rind that was cauterized and stripped down.  Medial and lateral wall mobilization was performed. The gallbladder/cystic duct junction was attempted to be skeletonized but the cystic duct rip right at the entry to the gallbladder.  The infundibulum of the gallbladder also fell to pieces and stones fell out.  The cystic artery was up on a pedicle in rind but clearly had a branch going posterior. In order to further delineate the cystic artery the medial and lateral edges of the gallbladder were dissected further and since it was so inflamed and somewhat intrahepatic I got into the liver especially on the lateral edge.  Once this was done the gallbladder was able to be retracted away from the hepatic fossa and the cystic artery was clipped on both branches triply and divided.The cystic duct was essentially torn edges without any signs of bile leaking out and we could not fully identify a duct.  From here the gallbladder dissection off the liver was completed with cautery, and the gallbladder was placed in an Endocatch bag. We then had another bag for stone retrieval. Both of these bags were left in the abdomen as we worked to prevent loss of pneumoperitoneum.  The hepatic fossa was investigated and there was still some bleeding from the branching cystic arteries and a clip was placed more proximal before the branching.  The liver edge and hepatic fossa had bleeding that was cauterized and a small trickle of bile was noted from the lateral edge of the liver at about the mid way up point on the hepatic fossa. There was no ducts and this was clearly away from the  common duct or hepatic duct trajectory. It appeared that the leak was coming from a edge where the gallbladder had been intrahepatic.    I attempted to cauterize the liver but it did not stop the small bile leak.  We looked at the area of tissue we thought was the cystic duct remnants and  I attempted to place one clip across the area without getting to deep.  No bile was coming from this the entire time of the procedure.   Given that we did not want to cauterize or cause any damage to the remaining bile system, I opted to place a JP drain and get a post operative ERCP.    The remaining stones and blood were collected in a third Endocatch bag.  The area was suctioned out and Arista was placed in the fossa with Surgical Snow. A JP drain was pulled through a trocar site and placed in the hepatic fossa.    All three Endopouch bags were retrieved through the epigastric site. The JP was secured with a 3-0 Nylon. The epigastric and umbilical port sites were deep and smaller than the tip of my finger.  Staples were used to close the trocar sites remaining given the empyema.    An elliptical incisional biopsy was performed in the skin and subcutaneous tissue of the reticular lesion and carried down into the hardened subcutaneous tissue with a scalpel. This was sent to pathology. Hemostasis was achieved and it was closed with staples. Dressings were placed.  The patient was awakened from anesthesia and extubated without complication.   I notified Dr. Laural Golden of my desire for ERCP given the findings and inability to clip the cystic duct.    Curlene Labrum, MD [facility] [address] [place], [place] [id number] [phone] (office)

## 2020-10-09 NOTE — Plan of Care (Signed)

## 2020-10-09 NOTE — Consult Note (Signed)
Referring Provider: Dr. Constance Haw Primary Care Physician:  [name], DO Primary Gastroenterologist:  Dr. Laural Golden  Date of Admission: 10/09/20 Date of Consultation: 10/09/20  Reason for Consultation:  Need for ERCP   HPI:  Rody Keadle is a 50 y.o. year old male with history of chronic RUQ pain, bloating, and nausea that had worsened over the past few weeks, worsened by fatty foods, [place] with gallstones and fatty liver with incidental finding of small well-circumscribed hyperechoic structure within the subcutaneous soft tissue of the right ventral abdominal wall, undergoing elective cholecystectomy today by Dr. Constance Haw.  Intra-operatively, the gallbladder was inflamed, full of stones, and tore just with grasping with purulence emanating, indicating empyema. Torn cystic duct, with 1 clip placed across remnant of cystic duct. JP drain was placed. ERCP requested to confirm no cystic duct leak.   Patient seen in PACU. Drowsy but easily arousable. Unable to provide much information but does state he is aware of surgical findings.     Past Medical History:  Diagnosis Date  . Anxiety 10-2014  . Bipolar disorder ([place]) age 89  . COPD (chronic obstructive pulmonary disease) ([place]) 2016  . Depression age 89  . Enlarged prostate   . Hyperlipidemia 2016  . Hypertension 2013  . Schizoaffective disorder ([place]) 11-13-14  . Sleep apnea   . Thyroid disease 11-2014    Past Surgical History:  Procedure Laterality Date  . CARPAL TUNNEL RELEASE Left 02/21/2016   Procedure: CARPAL TUNNEL RELEASE;  Surgeon: Carole Civil, MD;  Location: [facility];  Service: Orthopedics;  Laterality: Left;  . HERNIA REPAIR Left 2002   groin  . INGUINAL HERNIA REPAIR Left 04/05/2017   Procedure: RECURRENT HERNIA REPAIR INGUINAL ADULT WITH MESH;  Surgeon: [name], MD;  Location: [facility];  Service: General;  Laterality: Left;  Marland Kitchen MASS EXCISION Right 04/29/2020   Procedure: EXCISION MASS RIGHT WRIST;  Surgeon:  [name], MD;  Location: [facility];  Service: Orthopedics;  Laterality: Right;    Prior to Admission medications   Medication Sig Start Date End Date Taking? Authorizing Provider  ondansetron (ZOFRAN ODT) 4 MG disintegrating tablet Take 1 tablet (4 mg total) by mouth every 8 (eight) hours as needed. Patient taking differently: Take 4 mg by mouth every 8 (eight) hours as needed for vomiting or nausea. 09/24/20  Yes Ivy Lynn, NP  risperiDONE (RISPERDAL) 1 MG tablet Take 1 mg by mouth at bedtime. 10/18/18  Yes [provider]  senna-docusate (SENOKOT-S) 8.6-50 MG tablet Take 1 tablet by mouth at bedtime as needed for mild constipation or moderate constipation. 09/17/20  Yes Long, Wonda Olds, MD  traZODone (DESYREL) 150 MG tablet Take 300 mg by mouth at bedtime. 09/18/19  Yes [provider]    Current Facility-Administered Medications  Medication Dose Route Frequency Provider Last Rate Last Admin  . 0.9 % irrigation (POUR BTL)    PRN Virl Cagey, MD   1,000 mL at 10/09/20 0715  . acetaminophen (TYLENOL) tablet 1,000 mg  1,000 mg Oral Q6H Virl Cagey, MD      . bupivacaine (MARCAINE) 0.5 % injection    PRN Virl Cagey, MD   20 mL at 10/09/20 0925  . cefTRIAXone (ROCEPHIN) 2 g in sodium chloride 0.9 % 100 mL IVPB  2 g Intravenous Q24H Virl Cagey, MD      . diphenhydrAMINE (BENADRYL) 12.5 MG/5ML elixir 12.5 mg  12.5 mg Oral Q6H PRN Virl Cagey, MD  Or  . diphenhydrAMINE (BENADRYL) injection 12.5 mg  12.5 mg Intravenous Q6H PRN Virl Cagey, MD      . docusate sodium (COLACE) capsule 100 mg  100 mg Oral BID Virl Cagey, MD      . hemostatic agents    PRN Virl Cagey, MD   1 application at 19/41/74 0820  . HYDROmorphone (DILAUDID) injection 0.25-0.5 mg  0.25-0.5 mg Intravenous Q5 min PRN Louann Sjogren, MD   0.5 mg at 10/09/20 1118  . lactated ringers infusion   Intravenous Continuous Virl Cagey, MD      . metoprolol tartrate (LOPRESSOR) injection 5 mg  5 mg Intravenous Q6H PRN Virl Cagey, MD      . morphine 2 MG/ML injection 2 mg  2 mg Intravenous Q3H PRN Virl Cagey, MD      . ondansetron [facility]) injection 4 mg  4 mg Intravenous Once PRN Louann Sjogren, MD      . ondansetron (ZOFRAN-ODT) disintegrating tablet 4 mg  4 mg Oral Q6H PRN Virl Cagey, MD       Or  . ondansetron [facility]) injection 4 mg  4 mg Intravenous Q6H PRN Virl Cagey, MD      . oxyCODONE (Oxy IR/ROXICODONE) immediate release tablet 5-10 mg  5-10 mg Oral Q4H PRN Virl Cagey, MD      . pantoprazole (PROTONIX) injection 40 mg  40 mg Intravenous QHS Virl Cagey, MD      . simethicone [facility]) chewable tablet 40 mg  40 mg Oral Q6H PRN Virl Cagey, MD      . sodium chloride irrigation 0.9 %    PRN Virl Cagey, MD   1,000 mL at 10/09/20 0900  . zolpidem (AMBIEN) tablet 5 mg  5 mg Oral QHS PRN,MR X 1 Virl Cagey, MD        Allergies as of 09/26/2020 - Review Complete 09/26/2020  Allergen Reaction Noted  . Desipramine Nausea Only and Rash 08/24/2012    Family History  Problem Relation Age of Onset  . Diabetes Father   . Heart disease Father   . Heart disease Maternal Grandmother   . Stroke Maternal Grandfather     Social History   Socioeconomic History  . Marital status: Single    Spouse name: Not on file  . Number of children: Not on file  . Years of education: Not on file  . Highest education level: Not on file  Occupational History  . Not on file  Tobacco Use  . Smoking status: Current Every Day Smoker    Packs/day: 0.25    Years: 26.00    Pack years: 6.50    Types: Cigarettes  . Smokeless tobacco: Never Used  . Tobacco comment: 4-5 cig daily as of 10/07/2020.  Vaping Use  . Vaping Use: Never used  Substance and Sexual Activity  . Alcohol use: No  . Drug use: No  . Sexual activity: Never    Birth control/protection: None   Other Topics Concern  . Not on file  Social History Narrative  . Not on file   Social Determinants of Health   Financial Resource Strain: Not on file  Food Insecurity: Not on file  Transportation Needs: Not on file  Physical Activity: Not on file  Stress: Not on file  Social Connections: Not on file  Intimate Partner Violence: Not on file    Review of Systems: Gen: Denies fever,  chills, loss of appetite, change in weight or weight loss CV: Denies chest pain, heart palpitations, syncope, edema  Resp: Denies shortness of breath with rest, cough, wheezing GI: Denies dysphagia or odynophagia. Denies vomiting blood, jaundice, and fecal incontinence.  GU : Denies urinary burning, urinary frequency, urinary incontinence.  MS: Denies joint pain,swelling, cramping Derm: Denies rash, itching, dry skin Psych: Denies depression, anxiety,confusion, or memory loss Heme: Denies bruising, bleeding, and enlarged lymph nodes.  Physical Exam: Vital signs in last 24 hours: Temp:  [98.5 F (36.9 C)-98.8 F (37.1 C)] 98.5 F (36.9 C) (04/06 1115) Pulse Rate:  [90-102] 97 (04/06 1215) Resp:  [11-22] 18 (04/06 1215) BP: (144-177)/(87-104) 147/96 (04/06 1145) SpO2:  [86 %-98 %] 98 % (04/06 1215)   General:   Drowsy but easily awakens.  Head:  Normocephalic and atraumatic. Eyes:  Sclera clear, no icterus.   Conjunctiva pink. Ears:  Normal auditory acuity. Nose:  No deformity, discharge,  or lesions. Mouth:  No deformity or lesions, dentition normal. Lungs:  Clear throughout to auscultation.   Heart: S1 S2 present without murmurs Abdomen:  Post-op dressings in place, JP drain right side with sanguineous drainage, +BS  Rectal:  Deferred  Msk:  Symmetrical without gross deformities. Normal posture. Extremities:  Without edema. Neurologic:  Oriented X 4 Psych:  Flat affect   Intake/Output from previous day: No intake/output data recorded. Intake/Output this shift: Total I/O In: 1520  [P.O.:120; I.V.:1300; IV Piggyback:100] Out: 125 [Drains:50; Blood:75]   Studies/Results: No results found.  Impression: Steve Romero is a 50 y.o. year old male with history of chronic RUQ pain, bloating, and nausea that had worsened over the past few weeks, worsened by fatty foods, [place] with gallstones and fatty liver with incidental finding of small well-circumscribed hyperechoic structure within the subcutaneous soft tissue of the right ventral abdominal wall, undergoing elective cholecystectomy today by Dr. Constance Haw that was difficult given infection and inflammation. Empyema of gallbladder noted, torn cystic duct with 1 clip placed across remnant of cystic duct and JP drain placed.   ERCP has been requested for 4/7 by Dr. Laural Golden to evaluate cystic duct and confirm no leak.    Plan: ERCP +/- stent placement by Dr. Laural Golden on 10/10/20 Diet per surgery NPO after midnight Continue Rocephin daily Will reassess in am  Annitta Needs, PhD, ANP-BC [facility]     LOS: 0 days    10/09/2020, 12:38 PM

## 2020-10-09 NOTE — Transfer of Care (Signed)
Immediate Anesthesia Transfer of Care Note  Patient: Steve Romero  Procedure(s) Performed: LAPAROSCOPIC CHOLECYSTECTOMY (N/A ) EXCISION MASS, ABDOMINAL WALL, 2CM (Right )  Patient Location: PACU  Anesthesia Type:General  Level of Consciousness: awake, alert  and oriented  Airway & Oxygen Therapy: Patient Spontanous Breathing and Patient connected to face mask oxygen  Post-op Assessment: Report given to RN and Post -op Vital signs reviewed and stable  Post vital signs: Reviewed and stable  Last Vitals:  Vitals Value Taken Time  BP 147/80 10/09/20 0945  Temp    Pulse 82 10/09/20 0946  Resp 15 10/09/20 0946  SpO2 98 % 10/09/20 0946  Vitals shown include unvalidated device data.  Last Pain:  Vitals:   10/09/20 0654  TempSrc: Oral  PainSc: 4       Patients Stated Pain Goal: 7 (55/97/41 6384)  Complications: No complications documented.

## 2020-10-09 NOTE — Anesthesia Procedure Notes (Signed)
Procedure Name: Intubation Date/Time: 10/09/2020 7:35 AM Performed by: Orlie Dakin, CRNA Pre-anesthesia Checklist: Patient identified, Emergency Drugs available, Suction available and Patient being monitored Patient Re-evaluated:Patient Re-evaluated prior to induction Oxygen Delivery Method: Circle system utilized Preoxygenation: Pre-oxygenation with 100% oxygen Induction Type: IV induction Ventilation: Mask ventilation without difficulty and Oral airway inserted - appropriate to patient size Laryngoscope Size: Sabra Heck and 3 Grade View: Grade I Tube type: Oral Tube size: 7.5 mm Number of attempts: 1 Airway Equipment and Method: Stylet Placement Confirmation: ETT inserted through vocal cords under direct vision,  positive ETCO2 and breath sounds checked- equal and bilateral Secured at: 23 cm Tube secured with: Tape Dental Injury: Teeth and Oropharynx as per pre-operative assessment

## 2020-10-09 NOTE — Anesthesia Postprocedure Evaluation (Signed)
Anesthesia Post Note  Patient: Steve Romero  Procedure(s) Performed: LAPAROSCOPIC CHOLECYSTECTOMY (N/A ) EXCISION MASS, ABDOMINAL WALL, 2CM (Right )  Patient location during evaluation: Phase II Anesthesia Type: General Level of consciousness: awake Pain management: pain level controlled Vital Signs Assessment: post-procedure vital signs reviewed and stable Respiratory status: spontaneous breathing and respiratory function stable Cardiovascular status: blood pressure returned to baseline and stable Postop Assessment: no headache and no apparent nausea or vomiting Anesthetic complications: no Comments: Late entry   No complications documented.   Last Vitals:  Vitals:   10/09/20 1215 10/09/20 1301  BP:  (!) 154/95  Pulse: 97 93  Resp: 18 16  Temp:  36.5 C  SpO2: 98% 98%    Last Pain:  Vitals:   10/09/20 1526  TempSrc:   PainSc: [place]

## 2020-10-09 NOTE — Anesthesia Preprocedure Evaluation (Signed)
Anesthesia Evaluation  Patient identified by MRN, date of birth, ID band Patient awake    Reviewed: Allergy & Precautions, H&P , NPO status , Patient's Chart, lab work & pertinent test results, reviewed documented beta blocker date and time   History of Anesthesia Complications (+) AWARENESS UNDER ANESTHESIA  Airway Mallampati: II  TM Distance: >3 FB Neck ROM: full    Dental no notable dental hx.    Pulmonary sleep apnea , COPD,  COPD inhaler, Current Smoker and Patient abstained from smoking.,    Pulmonary exam normal breath sounds clear to auscultation       Cardiovascular Exercise Tolerance: Good hypertension, negative cardio ROS   Rhythm:regular Rate:Normal     Neuro/Psych PSYCHIATRIC DISORDERS Anxiety Depression Bipolar Disorder Schizophrenia  Neuromuscular disease    GI/Hepatic negative GI ROS, Neg liver ROS,   Endo/Other  negative endocrine ROS  Renal/GU negative Renal ROS  negative genitourinary   Musculoskeletal   Abdominal   Peds  Hematology negative hematology ROS (+)   Anesthesia Other Findings   Reproductive/Obstetrics negative OB ROS                             Anesthesia Physical Anesthesia Plan  ASA: III  Anesthesia Plan: General   Post-op Pain Management:    Induction:   PONV Risk Score and Plan: Propofol infusion  Airway Management Planned:   Additional Equipment:   Intra-op Plan:   Post-operative Plan:   Informed Consent: I have reviewed the patients History and Physical, chart, labs and discussed the procedure including the risks, benefits and alternatives for the proposed anesthesia with the patient or authorized representative who has indicated his/her understanding and acceptance.     Dental Advisory Given  Plan Discussed with: CRNA  Anesthesia Plan Comments:         Anesthesia Quick Evaluation

## 2020-10-09 NOTE — Progress Notes (Signed)
[facility]  Notified Father of cholecystectomy and difficulty given infection and inflammation. Notified of the plans for ERCP tomorrow.   Admit Clears PRN pain meds NPO midnight Ceftriaxone give empyema ERCP tomorrow  Curlene Labrum, MD [facility] [address] [place], [place] [address] [phone] (office)  b

## 2020-10-09 NOTE — Interval H&P Note (Signed)
History and Physical Interval Note:  10/09/2020 7:24 AM  Steve Romero  has presented today for surgery, with the diagnosis of Cholelithiasis Neoplasm of uncertain behavior.  The various methods of treatment have been discussed with the patient and family. After consideration of risks, benefits and other options for treatment, the patient has consented to  Procedure(s): LAPAROSCOPIC CHOLECYSTECTOMY (N/A) EXCISION MASS, ABDOMINAL WALL, 2CM (N/A) as a surgical intervention.  The patient's history has been reviewed, patient examined, no change in status, stable for surgery.  I have reviewed the patient's chart and labs.  Questions were answered to the patient's satisfaction.    No changes.  Virl Cagey

## 2020-10-10 ENCOUNTER — Encounter (HOSPITAL_COMMUNITY): Payer: Self-pay | Admitting: General Surgery

## 2020-10-10 ENCOUNTER — Inpatient Hospital Stay (HOSPITAL_COMMUNITY): Payer: Medicaid Other | Admitting: Certified Registered Nurse Anesthetist

## 2020-10-10 ENCOUNTER — Inpatient Hospital Stay (HOSPITAL_COMMUNITY): Payer: Medicaid Other

## 2020-10-10 ENCOUNTER — Encounter (HOSPITAL_COMMUNITY)
Admission: RE | Disposition: A | Discharge status: Home / Self Care | Payer: Self-pay | Source: Ambulatory Visit | Attending: General Surgery

## 2020-10-10 ENCOUNTER — Observation Stay (HOSPITAL_COMMUNITY): Payer: Medicaid Other

## 2020-10-10 DIAGNOSIS — J449 Chronic obstructive pulmonary disease, unspecified: Secondary | ICD-10-CM | POA: Diagnosis not present

## 2020-10-10 DIAGNOSIS — I10 Essential (primary) hypertension: Secondary | ICD-10-CM | POA: Diagnosis not present

## 2020-10-10 DIAGNOSIS — F419 Anxiety disorder, unspecified: Secondary | ICD-10-CM | POA: Diagnosis not present

## 2020-10-10 DIAGNOSIS — G4730 Sleep apnea, unspecified: Secondary | ICD-10-CM | POA: Diagnosis present

## 2020-10-10 DIAGNOSIS — E079 Disorder of thyroid, unspecified: Secondary | ICD-10-CM | POA: Diagnosis present

## 2020-10-10 DIAGNOSIS — Z79899 Other long term (current) drug therapy: Secondary | ICD-10-CM | POA: Diagnosis not present

## 2020-10-10 DIAGNOSIS — K838 Other specified diseases of biliary tract: Secondary | ICD-10-CM

## 2020-10-10 DIAGNOSIS — N400 Enlarged prostate without lower urinary tract symptoms: Secondary | ICD-10-CM | POA: Diagnosis not present

## 2020-10-10 DIAGNOSIS — K567 Ileus, unspecified: Secondary | ICD-10-CM | POA: Diagnosis not present

## 2020-10-10 DIAGNOSIS — K810 Acute cholecystitis: Secondary | ICD-10-CM | POA: Diagnosis not present

## 2020-10-10 DIAGNOSIS — M62838 Other muscle spasm: Secondary | ICD-10-CM | POA: Diagnosis not present

## 2020-10-10 DIAGNOSIS — K9189 Other postprocedural complications and disorders of digestive system: Secondary | ICD-10-CM

## 2020-10-10 DIAGNOSIS — E785 Hyperlipidemia, unspecified: Secondary | ICD-10-CM | POA: Diagnosis not present

## 2020-10-10 DIAGNOSIS — Y848 Other medical procedures as the cause of abnormal reaction of the patient, or of later complication, without mention of misadventure at the time of the procedure: Secondary | ICD-10-CM | POA: Diagnosis present

## 2020-10-10 DIAGNOSIS — L989 Disorder of the skin and subcutaneous tissue, unspecified: Secondary | ICD-10-CM | POA: Diagnosis not present

## 2020-10-10 DIAGNOSIS — F259 Schizoaffective disorder, unspecified: Secondary | ICD-10-CM | POA: Diagnosis present

## 2020-10-10 DIAGNOSIS — K839 Disease of biliary tract, unspecified: Secondary | ICD-10-CM

## 2020-10-10 DIAGNOSIS — F319 Bipolar disorder, unspecified: Secondary | ICD-10-CM | POA: Diagnosis not present

## 2020-10-10 DIAGNOSIS — Z8249 Family history of ischemic heart disease and other diseases of the circulatory system: Secondary | ICD-10-CM | POA: Diagnosis not present

## 2020-10-10 DIAGNOSIS — Z888 Allergy status to other drugs, medicaments and biological substances status: Secondary | ICD-10-CM | POA: Diagnosis not present

## 2020-10-10 DIAGNOSIS — T80818A Extravasation of other vesicant agent, initial encounter: Secondary | ICD-10-CM | POA: Diagnosis not present

## 2020-10-10 DIAGNOSIS — K915 Postcholecystectomy syndrome: Secondary | ICD-10-CM | POA: Diagnosis not present

## 2020-10-10 DIAGNOSIS — R0600 Dyspnea, unspecified: Secondary | ICD-10-CM | POA: Diagnosis not present

## 2020-10-10 DIAGNOSIS — F17210 Nicotine dependence, cigarettes, uncomplicated: Secondary | ICD-10-CM | POA: Diagnosis present

## 2020-10-10 DIAGNOSIS — K8010 Calculus of gallbladder with chronic cholecystitis without obstruction: Secondary | ICD-10-CM | POA: Diagnosis not present

## 2020-10-10 DIAGNOSIS — D171 Benign lipomatous neoplasm of skin and subcutaneous tissue of trunk: Secondary | ICD-10-CM | POA: Diagnosis not present

## 2020-10-10 DIAGNOSIS — K859 Acute pancreatitis without necrosis or infection, unspecified: Secondary | ICD-10-CM | POA: Diagnosis not present

## 2020-10-10 DIAGNOSIS — R21 Rash and other nonspecific skin eruption: Secondary | ICD-10-CM | POA: Diagnosis present

## 2020-10-10 HISTORY — PX: ERCP: SHX5425

## 2020-10-10 HISTORY — PX: SPHINCTEROTOMY: SHX5544

## 2020-10-10 HISTORY — PX: BILIARY STENT PLACEMENT: SHX5538

## 2020-10-10 LAB — CBC WITH DIFFERENTIAL/PLATELET
Abs Immature Granulocytes: 0.07 10*3/uL (ref 0.00–0.07)
Basophils Absolute: 0.1 10*3/uL (ref 0.0–0.1)
Basophils Relative: 0 %
Eosinophils Absolute: 0 10*3/uL (ref 0.0–0.5)
Eosinophils Relative: 0 %
HCT: 53.2 % — ABNORMAL HIGH (ref 39.0–52.0)
Hemoglobin: 17.6 g/dL — ABNORMAL HIGH (ref 13.0–17.0)
Immature Granulocytes: 1 %
Lymphocytes Relative: 20 %
Lymphs Abs: 2.9 10*3/uL (ref 0.7–4.0)
MCH: 30.6 pg (ref 26.0–34.0)
MCHC: 33.1 g/dL (ref 30.0–36.0)
MCV: 92.5 fL (ref 80.0–100.0)
Monocytes Absolute: 0.9 10*3/uL (ref 0.1–1.0)
Monocytes Relative: 7 %
Neutro Abs: 10.3 10*3/uL — ABNORMAL HIGH (ref 1.7–7.7)
Neutrophils Relative %: 72 %
Platelets: 248 10*3/uL (ref 150–400)
RBC: 5.75 MIL/uL (ref 4.22–5.81)
RDW: 12.9 % (ref 11.5–15.5)
WBC: 14.2 10*3/uL — ABNORMAL HIGH (ref 4.0–10.5)
nRBC: 0 % (ref 0.0–0.2)

## 2020-10-10 LAB — BASIC METABOLIC PANEL
Anion gap: 13 (ref 5–15)
BUN: 13 mg/dL (ref 6–20)
CO2: 28 mmol/L (ref 22–32)
Calcium: 9 mg/dL (ref 8.9–10.3)
Chloride: 98 mmol/L (ref 98–111)
Creatinine, Ser: 1.15 mg/dL (ref 0.61–1.24)
GFR, Estimated: >60 mL/min (ref >60)
Glucose, Bld: 115 mg/dL — ABNORMAL HIGH (ref 70–99)
Potassium: 4 mmol/L (ref 3.5–5.1)
Sodium: 139 mmol/L (ref 135–145)

## 2020-10-10 LAB — HEPATIC FUNCTION PANEL
ALT: 77 U/L — ABNORMAL HIGH (ref 0–44)
AST: 38 U/L (ref 15–41)
Albumin: 3.9 g/dL (ref 3.5–5.0)
Alkaline Phosphatase: 110 U/L (ref 38–126)
Bilirubin, Direct: 0.2 mg/dL (ref 0.0–0.2)
Indirect Bilirubin: 0.7 mg/dL (ref 0.3–0.9)
Total Bilirubin: 0.9 mg/dL (ref 0.3–1.2)
Total Protein: 7 g/dL (ref 6.5–8.1)

## 2020-10-10 LAB — TROPONIN I (HIGH SENSITIVITY)
Troponin I (High Sensitivity): 6 ng/L (ref <18)
Troponin I (High Sensitivity): 6 ng/L (ref <18)

## 2020-10-10 LAB — GLUCOSE, CAPILLARY: Glucose-Capillary: 138 mg/dL — ABNORMAL HIGH (ref 70–99)

## 2020-10-10 SURGERY — ERCP, WITH INTERVENTION IF INDICATED
Anesthesia: General | Site: Esophagus

## 2020-10-10 MED ORDER — SUCCINYLCHOLINE 20MG/ML (10ML) SYRINGE FOR MEDFUSION PUMP - OPTIME
INTRAMUSCULAR | Status: DC | PRN
  Administered 2020-10-10: 160 mg via INTRAVENOUS

## 2020-10-10 MED ORDER — LIDOCAINE HCL (PF) 2 % IJ SOLN
INTRAMUSCULAR | Status: AC
  Filled 2020-10-10: qty 10

## 2020-10-10 MED ORDER — LACTATED RINGERS IV SOLN: INTRAVENOUS | Status: DC

## 2020-10-10 MED ORDER — SODIUM CHLORIDE 0.9 % IV SOLN
INTRAVENOUS | Status: DC | PRN
  Administered 2020-10-10: 26 mL

## 2020-10-10 MED ORDER — EPHEDRINE 5 MG/ML INJ
INTRAVENOUS | Status: AC
  Filled 2020-10-10: qty 30

## 2020-10-10 MED ORDER — BISACODYL 10 MG RE SUPP
10.0000 mg | Freq: Every day | RECTAL | Status: DC | PRN
  Administered 2020-10-10 – 2020-10-15 (×2): 10 mg via RECTAL
  Filled 2020-10-10 (×2): qty 1

## 2020-10-10 MED ORDER — ORAL CARE MOUTH RINSE: 15.0000 mL | Freq: Once | OROMUCOSAL | Status: AC

## 2020-10-10 MED ORDER — GADOBUTROL 1 MMOL/ML IV SOLN
10.0000 mL | Freq: Once | INTRAVENOUS | Status: AC | PRN
  Administered 2020-10-10: 10 mL via INTRAVENOUS

## 2020-10-10 MED ORDER — MIDAZOLAM HCL 2 MG/2ML IJ SOLN
INTRAMUSCULAR | Status: AC
  Filled 2020-10-10: qty 2

## 2020-10-10 MED ORDER — SODIUM CHLORIDE 0.9 % IV SOLN
INTRAVENOUS | Status: AC
  Filled 2020-10-10: qty 100

## 2020-10-10 MED ORDER — CHLORHEXIDINE GLUCONATE 0.12 % MT SOLN
OROMUCOSAL | Status: AC
  Administered 2020-10-10: 15 mL via OROMUCOSAL
  Filled 2020-10-10: qty 15

## 2020-10-10 MED ORDER — FENTANYL CITRATE (PF) 100 MCG/2ML IJ SOLN
INTRAMUSCULAR | Status: AC
  Filled 2020-10-10: qty 2

## 2020-10-10 MED ORDER — FENTANYL CITRATE (PF) 100 MCG/2ML IJ SOLN
INTRAMUSCULAR | Status: DC | PRN
  Administered 2020-10-10 (×2): 50 ug via INTRAVENOUS

## 2020-10-10 MED ORDER — PHENYLEPHRINE 40 MCG/ML (10ML) SYRINGE FOR IV PUSH (FOR BLOOD PRESSURE SUPPORT)
PREFILLED_SYRINGE | INTRAVENOUS | Status: AC
  Filled 2020-10-10: qty 10

## 2020-10-10 MED ORDER — GLUCAGON HCL RDNA (DIAGNOSTIC) 1 MG IJ SOLR
INTRAMUSCULAR | Status: DC | PRN
  Administered 2020-10-10 (×2): .25 mg via INTRAVENOUS

## 2020-10-10 MED ORDER — ONDANSETRON HCL 4 MG/2ML IJ SOLN: 4.0000 mg | Freq: Once | INTRAMUSCULAR | Status: DC | PRN

## 2020-10-10 MED ORDER — GLUCAGON HCL RDNA (DIAGNOSTIC) 1 MG IJ SOLR
INTRAMUSCULAR | Status: AC
  Filled 2020-10-10: qty 2

## 2020-10-10 MED ORDER — CHLORHEXIDINE GLUCONATE 0.12 % MT SOLN: 15.0000 mL | Freq: Once | OROMUCOSAL | Status: AC

## 2020-10-10 MED ORDER — PROPOFOL 10 MG/ML IV BOLUS
INTRAVENOUS | Status: DC | PRN
  Administered 2020-10-10: 200 mg via INTRAVENOUS

## 2020-10-10 MED ORDER — STERILE WATER FOR IRRIGATION IR SOLN
Status: DC | PRN
  Administered 2020-10-10: 1000 mL

## 2020-10-10 MED ORDER — PHENYLEPHRINE HCL (PRESSORS) 10 MG/ML IV SOLN
INTRAVENOUS | Status: DC | PRN
  Administered 2020-10-10: 80 ug via INTRAVENOUS

## 2020-10-10 MED ORDER — SUCCINYLCHOLINE CHLORIDE 200 MG/10ML IV SOSY
PREFILLED_SYRINGE | INTRAVENOUS | Status: AC
  Filled 2020-10-10: qty 30

## 2020-10-10 MED ORDER — FENTANYL CITRATE (PF) 100 MCG/2ML IJ SOLN: 25.0000 ug | INTRAMUSCULAR | Status: DC | PRN

## 2020-10-10 MED ORDER — MIDAZOLAM HCL 5 MG/5ML IJ SOLN
INTRAMUSCULAR | Status: DC | PRN
  Administered 2020-10-10: 2 mg via INTRAVENOUS

## 2020-10-10 MED ORDER — ONDANSETRON HCL 4 MG/2ML IJ SOLN
INTRAMUSCULAR | Status: DC | PRN
  Administered 2020-10-10: 4 mg via INTRAVENOUS

## 2020-10-10 MED ORDER — LIDOCAINE HCL (CARDIAC) PF 50 MG/5ML IV SOSY
PREFILLED_SYRINGE | INTRAVENOUS | Status: DC | PRN
  Administered 2020-10-10: 100 mg via INTRAVENOUS

## 2020-10-10 SURGICAL SUPPLY — 22 items
BALLN RETRIEVAL 12X15 (BALLOONS) IMPLANT
BASKET TRAPEZOID 3X6 (MISCELLANEOUS) IMPLANT
BASKET TRAPEZOID LITHO 2.0X5 (MISCELLANEOUS) IMPLANT
DEVICE INFLATION ENCORE 26 (MISCELLANEOUS) IMPLANT
DEVICE LOCKING W-BIOPSY CAP (MISCELLANEOUS) IMPLANT
GUIDEWIRE HYDRA JAGWIRE .35 (WIRE) IMPLANT
GUIDEWIRE JAG HINI 025X260CM (WIRE) IMPLANT
KIT ENDO PROCEDURE PEN (KITS) IMPLANT
KIT TURNOVER KIT A (KITS) IMPLANT
LUBRICANT JELLY 4.5OZ STERILE (MISCELLANEOUS) IMPLANT
PAD ARMBOARD 7.5X6 YLW CONV (MISCELLANEOUS) IMPLANT
POSITIONER HEAD 8X9X4 ADT (SOFTGOODS) IMPLANT
SCOPE SPY DS DISPOSABLE (MISCELLANEOUS) IMPLANT
SNARE ROTATE MED OVAL 20MM (MISCELLANEOUS) IMPLANT
SNARE SHORT THROW 13M SML OVAL (MISCELLANEOUS) IMPLANT
SPHINCTEROTOME AUTOTOME .25 (MISCELLANEOUS) IMPLANT
SPHINCTEROTOME HYDRATOME 44 (MISCELLANEOUS) IMPLANT
SYSTEM CONTINUOUS INJECTION (MISCELLANEOUS) IMPLANT
TUBING INSUFFLATOR CO2MPACT (TUBING) IMPLANT
WALLSTENT METAL COVERED 10X60 (STENTS) IMPLANT
WALLSTENT METAL COVERED 10X80 (STENTS) IMPLANT
WATER STERILE IRR 1000ML POUR (IV SOLUTION) IMPLANT

## 2020-10-10 NOTE — Progress Notes (Signed)
[facility]  Troponin repeat negative.  RN reports pain improved with gasx.  Curlene Labrum, MD

## 2020-10-10 NOTE — Progress Notes (Signed)
Patient c/o chest pain , STAT EKG ordered VSS Triponin also ordered   10/10/20 1838  Vitals  Temp 97.9 F (36.6 C)  Temp Source Oral  BP (!) 136/96  MAP (mmHg) 109  BP Method Automatic  Pulse Rate 88  Pulse Rate Source Monitor  MEWS COLOR  MEWS Score Color Green  Oxygen Therapy  SpO2 90 %  O2 Device Nasal Cannula  MEWS Score  MEWS Temp 0  MEWS Systolic 0  MEWS Pulse 0  MEWS RR 0  MEWS LOC 0  MEWS Score 0

## 2020-10-10 NOTE — Progress Notes (Signed)
JP drain draining brown fluid. Teach patient how to charge drain. Verbalized understanding.

## 2020-10-10 NOTE — Progress Notes (Signed)
  Patient had lap chole on 10/09/2020 by Dr. Constance Haw -had ERCP with plastic biliary stent placement on 10/10/2020 for biliary leak by Dr. Laural Golden -Complains of chest discomfort and diaphoresis -Patient sister and daughter at bedside -Received IV morphine, EKG and troponin requested -Vital signs stable at this time -Patient is a smoker currently on 2 L of oxygen via nasal cannula Roxan Hockey, MD

## 2020-10-10 NOTE — Transfer of Care (Signed)
Immediate Anesthesia Transfer of Care Note  Patient: Steve Romero  Procedure(s) Performed: ENDOSCOPIC RETROGRADE CHOLANGIOPANCREATOGRAPHY (ERCP) (N/A Esophagus) BILIARY STENT PLACEMENT (N/A Esophagus) SPHINCTEROTOMY (N/A Esophagus)  Patient Location: PACU  Anesthesia Type:General  Level of Consciousness: awake  Airway & Oxygen Therapy: Patient Spontanous Breathing  Post-op Assessment: Report given to RN  Post vital signs: Reviewed and stable  Last Vitals:  Vitals Value Taken Time  BP 161/80 10/10/20 1602  Temp    Pulse 98 10/10/20 1604  Resp 20 10/10/20 1604  SpO2 93 % 10/10/20 1604  Vitals shown include unvalidated device data.  Last Pain:  Vitals:   10/10/20 1230  TempSrc: Oral  PainSc: 5       Patients Stated Pain Goal: 6 (54/62/70 3500)  Complications: No complications documented.

## 2020-10-10 NOTE — Progress Notes (Signed)
[facility] Progress Note  1 Day Post-Op  Subjective: Says he is hurting on the side of the bed. JP with 500 bile output. Discussed with him that plans for ERCP today but also will get MRCP due to concerns that he could have aberrant/ rare anatomy and that he may need to be transferred for another surgery if his right hepatic could have been injured but we will not know until we do these procedures.   Objective: Vital signs in last 24 hours: Temp:  [97.7 F (36.5 C)-98.6 F (37 C)] 98 F (36.7 C) (04/07 0249) Pulse Rate:  [90-108] 94 (04/07 0249) Resp:  [11-20] 20 (04/07 0249) BP: (139-160)/(78-104) 139/86 (04/07 0249) SpO2:  [86 %-98 %] 94 % (04/07 0249) Weight:  [130.8 kg] 130.8 kg (04/06 1549) Last BM Date: 10/08/20  Intake/Output from previous day: 04/06 0701 - 04/07 0700 In: 4073.4 [P.O.:843; I.V.:2730.4; IV Piggyback:500] Out: 1095 [Urine:500; Drains:520; Blood:75] Intake/Output this shift: No intake/output data recorded.  General appearance: alert, cooperative and mild distress Resp: normal work of breathing GI: soft, distended, tender, dressing in place, bile in JP drain  Lab Results:  Recent Labs    10/10/20 0620  WBC 14.2*  HGB 17.6*  HCT 53.2*  PLT 248   BMET Recent Labs    10/10/20 0620  NA 139  K 4.0  CL 98  CO2 28  GLUCOSE 115*  BUN 13  CREATININE 1.15  CALCIUM 9.0   PT/INR No results for input(s): LABPROT, INR in the last 72 hours.  Studies/Results: No results found.  Anti-infectives: Anti-infectives (From admission, onward)   Start     Dose/Rate Route Frequency Ordered Stop   10/09/20 1215  cefTRIAXone (ROCEPHIN) 2 g in sodium chloride 0.9 % 100 mL IVPB        2 g 200 mL/hr over 30 Minutes Intravenous Every 24 hours 10/09/20 1201     10/09/20 0630  cefoTEtan (CEFOTAN) 2 g in sodium chloride 0.9 % 100 mL IVPB        2 g 200 mL/hr over 30 Minutes Intravenous On call to O.R. 10/09/20 0626 10/09/20 1416   10/09/20 0630   sodium chloride 0.9 % with cefoTEtan (CEFOTAN) ADS Med       Note to Pharmacy: Donnamarie Rossetti   : cabinet override      10/09/20 0630 10/09/20 1416      Assessment/Plan: Mr. Knobel is a 50 yo s/p lap cholecystectomy from empyema of the gallbladder with concern for bile leak either from a cystic duct/ duct of luska versus an abberant right..  He has a bile drain in place. Work up is being done to determine exactly what is going on given the disease of his gallbladder and fact that it fell apart during surgery which was unexpected.   NPO PRN for pain Bile drain -JP MRCP ordered ERCP discussed concern and plan with Dr. Laural Golden  Antibiotics for empyema and bile leak    LOS: 0 days    Virl Cagey 10/10/2020

## 2020-10-10 NOTE — Progress Notes (Signed)
* Subjective:  Patient complains of pain in epigastric region radiating to the right side and also in right lower quadrant.  Pain is worse when he coughs or takes a deep breath.  He states he has been burping but has not passed flatus or stool.  He is not nauseated.  He denies chest pain.  Current Medications:  Current Facility-Administered Medications:  .  0.9 %  sodium chloride infusion, , Intravenous, Continuous, Demeisha Geraghty U, MD, Stopping Infusion hung by another clincian at 10/10/20 1303 .  [MAR Hold] acetaminophen (TYLENOL) tablet 1,000 mg, 1,000 mg, Oral, Q6H, Virl Cagey, MD, 1,000 mg at 10/09/20 2253 .  [MAR Hold] cefTRIAXone (ROCEPHIN) 2 g in sodium chloride 0.9 % 100 mL IVPB, 2 g, Intravenous, Q24H, Virl Cagey, MD, Last Rate: 200 mL/hr at 10/10/20 1203, 2 g at 10/10/20 1203 .  [MAR Hold] diphenhydrAMINE (BENADRYL) 12.5 MG/5ML elixir 12.5 mg, 12.5 mg, Oral, Q6H PRN **OR** [MAR Hold] diphenhydrAMINE (BENADRYL) injection 12.5 mg, 12.5 mg, Intravenous, Q6H PRN, Virl Cagey, MD .  Doug Sou Hold] docusate sodium (COLACE) capsule 100 mg, 100 mg, Oral, BID, Virl Cagey, MD, 100 mg at 10/09/20 2105 .  glucagon (human recombinant) (GLUCAGEN) 1 MG injection, , , ,  .  lactated ringers infusion, , Intravenous, Continuous, Virl Cagey, MD, Last Rate: 75 mL/hr at 10/09/20 1601, New Bag at 10/09/20 1601 .  lactated ringers infusion, , Intravenous, Continuous, Kiel, Coralie Keens, MD, Last Rate: 10 mL/hr at 10/10/20 1343, Continued from Pre-op at 10/10/20 1343 .  [MAR Hold] metoprolol tartrate (LOPRESSOR) injection 5 mg, 5 mg, Intravenous, Q6H PRN, Virl Cagey, MD .  [place] Sou Hold] morphine 2 MG/ML injection 2 mg, 2 mg, Intravenous, Q3H PRN, Virl Cagey, MD, 2 mg at 10/10/20 1042 .  [MAR Hold] ondansetron (ZOFRAN-ODT) disintegrating tablet 4 mg, 4 mg, Oral, Q6H PRN **OR** [MAR Hold] ondansetron (ZOFRAN) injection 4 mg, 4 mg, Intravenous, Q6H PRN, Virl Cagey, MD .  Doug Sou Hold] oxyCODONE (Oxy IR/ROXICODONE) immediate release tablet 5-10 mg, 5-10 mg, Oral, Q4H PRN, Virl Cagey, MD, 5 mg at 10/09/20 2251 .  [MAR Hold] pantoprazole (PROTONIX) injection 40 mg, 40 mg, Intravenous, QHS, Virl Cagey, MD, 40 mg at 10/09/20 2105 .  [MAR Hold] simethicone (MYLICON) chewable tablet 40 mg, 40 mg, Oral, Q6H PRN, Virl Cagey, MD, 40 mg at 10/09/20 1403 .  sodium chloride 0.9 % infusion, , , ,  .  [MAR Hold] zolpidem (AMBIEN) tablet 5 mg, 5 mg, Oral, QHS PRN,MR X 1, Bridges, Lanell Matar, MD   Objective: Blood pressure (!) 160/89, pulse (!) 101, temperature 98.8 F (37.1 C), temperature source Oral, resp. rate 20, height _0  (1.778 m), weight 130.8 kg, SpO2 90 %. Patient is alert and appears to be in no acute distress. No neck masses noted. Cardiac exam with regular rhythm normal S1 and S2.  No murmur gallop noted. Auscultation lungs reveal physical breath sounds bilaterally. Abdomen is protuberant.  Bowel sounds are normal.  Laparoscopy port sites are covered with dressing.  He has mild to moderate tenderness in epigastrium right upper quadrant and right lower quadrant.  No masses or organomegaly. No peripheral edema.  Bowel output 520 mL and last 2 shifts.  Labs/studies Results:  CBC Latest Ref Rng & Units 10/10/2020 09/17/2020 10/04/2019  WBC 4.0 - 10.5 K/uL 14.2(H) 13.4(H) 7.1  Hemoglobin 13.0 - 17.0 g/dL 17.6(H) 18.1(H) 17.3  Hematocrit 39.0 - 52.0 % 53.2(H) 53.0(H)  49.7  Platelets 150 - 400 K/uL 248 256 223    CMP Latest Ref Rng & Units 10/10/2020 09/17/2020 10/04/2019  Glucose 70 - 99 mg/dL 115(H) 112(H) 99  BUN 6 - 20 mg/dL _0 Creatinine 0.61 - 1.24 mg/dL 1.15 0.98 1.17  Sodium 135 - 145 mmol/L 139 133(L) 140  Potassium 3.5 - 5.1 mmol/L 4.0 4.1 4.4  Chloride 98 - 111 mmol/L 98 96(L) 99  CO2 22 - 32 mmol/L _1 Calcium 8.9 - 10.3 mg/dL 9.0 8.6(L) 9.8  Total Protein 6.5 - 8.1 g/dL 7.0 7.3 6.9  Total Bilirubin  0.3 - 1.2 mg/dL 0.9 0.8 0.3  Alkaline Phos 38 - 126 U/L 110 133(H) 144(H)  AST 15 - 41 U/L 38 15 20  ALT 0 - 44 U/L 77(H) 27 42    Hepatic Function Latest Ref Rng & Units 10/10/2020 09/17/2020 10/04/2019  Total Protein 6.5 - 8.1 g/dL 7.0 7.3 6.9  Albumin 3.5 - 5.0 g/dL 3.9 4.0 4.4  AST 15 - 41 U/L 38 15 20  ALT 0 - 44 U/L 77(H) 27 42  Alk Phosphatase 38 - 126 U/L 110 133(H) 144(H)  Total Bilirubin 0.3 - 1.2 mg/dL 0.9 0.8 0.3  Bilirubin, Direct 0.0 - 0.2 mg/dL 0.2 - -     MRCP is reviewed with Dr. Lavonia Dana. Heterogeneous fluid collection in gallbladder fossa. Normal intrahepatic and extra biliary system. Small amount of fluid along the inferior tip of the liver. Atelectasis.  Assessment:  #1.  Biliary leak suspected from subhepatic region possibly duct of Luschka.  Doubt bile duct injury based on MRCP images.  He remains with significant bile output.  He would benefit from biliary stenting.  Procedure risks reviewed with the patient.  He is agreeable to proceed.  Plan:  ERCP with biliary stenting.

## 2020-10-10 NOTE — Progress Notes (Signed)
[facility]  Chest pain early and diaphoretic. Dr. Denton Brick graciously went to see in person. VSS.  EKG wnl and no obvious changes from prior. No ST elevation. Troponin negative. Repeat in 3 hours.  Was given morphine and on 2L at the time of report.   Discussed with night shift RN. Pain is down to 4/10, will give some maalox to see if that helps/ possibly some indigestion.   RN will update patient. I tried to call into room but no answer. Appreciate everyone's assistance.  Will place on cardiac monitor.   Curlene Labrum, MD [facility] [address] [place], [place] [address] [phone] (office)

## 2020-10-10 NOTE — Progress Notes (Signed)
Brief ERCP note.  Erythema at ampulla water otherwise normal. Cholangiogram reveals cystic duct remnant leak. Short sphincterotomy performed to facilitate placement of biliary stent. 10 French 7 cm long plastic biliary stent placed for decompression. PD was not cannulated or filled with contrast.  Patient tolerated the procedure well.

## 2020-10-10 NOTE — Anesthesia Preprocedure Evaluation (Signed)
Anesthesia Evaluation  Patient identified by MRN, date of birth, ID band Patient awake    Reviewed: Allergy & Precautions, H&P , NPO status , Patient's Chart, lab work & pertinent test results, reviewed documented beta blocker date and time   History of Anesthesia Complications (+) AWARENESS UNDER ANESTHESIA  Airway Mallampati: II  TM Distance: >3 FB Neck ROM: full    Dental no notable dental hx.    Pulmonary sleep apnea , COPD,  COPD inhaler, Current Smoker and Patient abstained from smoking.,    Pulmonary exam normal breath sounds clear to auscultation       Cardiovascular Exercise Tolerance: Good hypertension, negative cardio ROS   Rhythm:regular Rate:Normal     Neuro/Psych PSYCHIATRIC DISORDERS Anxiety Depression Bipolar Disorder Schizophrenia  Neuromuscular disease    GI/Hepatic negative GI ROS, Neg liver ROS,   Endo/Other  negative endocrine ROS  Renal/GU negative Renal ROS  negative genitourinary   Musculoskeletal   Abdominal   Peds  Hematology negative hematology ROS (+)   Anesthesia Other Findings   Reproductive/Obstetrics negative OB ROS                             Anesthesia Physical  Anesthesia Plan  ASA: III  Anesthesia Plan: General   Post-op Pain Management:    Induction:   PONV Risk Score and Plan: Ondansetron  Airway Management Planned:   Additional Equipment:   Intra-op Plan:   Post-operative Plan:   Informed Consent: I have reviewed the patients History and Physical, chart, labs and discussed the procedure including the risks, benefits and alternatives for the proposed anesthesia with the patient or authorized representative who has indicated his/her understanding and acceptance.     Dental Advisory Given  Plan Discussed with: CRNA  Anesthesia Plan Comments:         Anesthesia Quick Evaluation

## 2020-10-10 NOTE — Op Note (Signed)
[facility] Patient Name: Steve Romero Procedure Date: 10/10/2020 2:28 PM MRN: [id number] Date of Birth: 1971/07/03 Attending MD: Hildred Laser , MD CSN: [id number] Age: 50 Admit Type: Outpatient Procedure:                ERCP Indications:              Suspected bile leak, Treatment of bile leak Providers:                Hildred Laser, MD, Gwenlyn Fudge, RN, Nelma Rothman,                            Technician Referring MD:             Lanell Matar. Constance Haw, MD Medicines:                General Anesthesia Complications:            No immediate complications. Estimated Blood Loss:     Estimated blood loss: none. Procedure:                Pre-Anesthesia Assessment:                           - Prior to the procedure, a History and Physical                            was performed, and patient medications and                            allergies were reviewed. The patient's tolerance of                            previous anesthesia was also reviewed. The risks                            and benefits of the procedure and the sedation                            options and risks were discussed with the patient.                            All questions were answered, and informed consent                            was obtained. Prior Anticoagulants: The patient has                            taken no previous anticoagulant or antiplatelet                            agents. ASA Grade Assessment: III - A patient with                            severe systemic disease. After reviewing the risks  and benefits, the patient was deemed in                            satisfactory condition to undergo the procedure.                           After obtaining informed consent, the scope was                            passed under direct vision. Throughout the                            procedure, the patient's blood pressure, pulse, and                            oxygen  saturations were monitored continuously. The                            [employer] D single use                            duodenoscope was introduced through the mouth, and                            used to inject contrast into and used to inject                            contrast into the bile duct. The [employer] D single use duodenoscope was                            introduced through the and used to inject contrast                            into and used to inject contrast into the bile                            duct. The ERCP was accomplished without difficulty.                            The patient tolerated the procedure well. Scope In: Scope Out: 3:44:07 PM Findings:      A scout film of the abdomen was obtained. Surgical clips, consistent       with a previous cholecystectomy, were seen in the area of the right       upper quadrant of the abdomen. The esophagus was successfully intubated       under direct vision. The scope was advanced to a normal major papilla in       the descending duodenum without detailed examination of the pharynx,       larynx and associated structures, and upper GI tract. The upper GI tract  was grossly normal. The bile duct was deeply cannulated with the       Hydratome sphincterotome. Contrast was injected. I personally       interpreted the bile duct images. There was brisk flow of contrast       through the ducts. Image quality was excellent. Contrast extended to the       entire biliary tree. Extravasation of contrast originating from the       cystic duct was observed. The biliary tree was otherwise normal. A 4 mm       biliary sphincterotomy was made with a braided Hydratome sphincterotome       using ERBE electrocautery. There was no post-sphincterotomy bleeding.       One 10 Fr by 7 cm plastic stent with a single external flap and a single       internal flap was  placed 6 cm into the common bile duct. Bile flowed       through the stent. The stent was in good position. Impression:               - A bile leak was found at cystic duct remnant.                           - A biliary sphincterotomy was performed.                           - One plastic stent was placed into the common bile                            duct. Moderate Sedation:      Per Anesthesia Care Recommendation:           - Return patient to hospital ward for ongoing care.                           - Clear liquid diet today.                           - Continue present medications.                           - Avoid aspirin and nonsteroidal anti-inflammatory                            medicines for 3 days.                           - Lab in am.                           - Repeat ERCP in 6 to 8 weeks. Procedure Code(s):        --- Professional ---                           [phone], Endoscopic retrograde                            cholangiopancreatography (ERCP); with placement of  endoscopic stent into biliary or pancreatic duct,                            including pre- and post-dilation and guide wire                            passage, when performed, including sphincterotomy,                            when performed, each stent Diagnosis Code(s):        --- Professional ---                           K83.9, Disease of biliary tract, unspecified                           K83.8, Other specified diseases of biliary tract CPT copyright 2019 American Medical Association. All rights reserved. The codes documented in this report are preliminary and upon coder review may  be revised to meet current compliance requirements. Hildred Laser, MD Hildred Laser, MD 10/10/2020 4:05:27 PM This report has been signed electronically. Number of Addenda: 0

## 2020-10-10 NOTE — Anesthesia Procedure Notes (Signed)
Procedure Name: Intubation Date/Time: 10/10/2020 2:53 PM Performed by: Ollen Bowl, CRNA Pre-anesthesia Checklist: Patient identified, Patient being monitored, Timeout performed, Emergency Drugs available and Suction available Patient Re-evaluated:Patient Re-evaluated prior to induction Oxygen Delivery Method: Circle system utilized Preoxygenation: Pre-oxygenation with 100% oxygen Induction Type: IV induction Ventilation: Mask ventilation without difficulty Laryngoscope Size: Mac and 4 Grade View: Grade I Tube type: Oral Tube size: 7.5 mm Number of attempts: 1 Airway Equipment and Method: Stylet Placement Confirmation: ETT inserted through vocal cords under direct vision,  positive ETCO2 and breath sounds checked- equal and bilateral Secured at: 22 cm Tube secured with: Tape Dental Injury: Teeth and Oropharynx as per pre-operative assessment

## 2020-10-11 ENCOUNTER — Encounter (HOSPITAL_COMMUNITY): Payer: Self-pay | Admitting: Internal Medicine

## 2020-10-11 ENCOUNTER — Inpatient Hospital Stay (HOSPITAL_COMMUNITY): Payer: Medicaid Other

## 2020-10-11 DIAGNOSIS — R0600 Dyspnea, unspecified: Secondary | ICD-10-CM | POA: Diagnosis not present

## 2020-10-11 DIAGNOSIS — K859 Acute pancreatitis without necrosis or infection, unspecified: Secondary | ICD-10-CM

## 2020-10-11 LAB — ECHOCARDIOGRAM COMPLETE
Area-P 1/2: 3.6 cm2
Calc EF: 48.3 %
Height: 70 in
S' Lateral: 1.85 cm
Single Plane A2C EF: 48.7 %
Single Plane A4C EF: 47.3 %
Weight: 4613.79 oz

## 2020-10-11 LAB — COMPREHENSIVE METABOLIC PANEL
ALT: 65 U/L — ABNORMAL HIGH (ref 0–44)
AST: 35 U/L (ref 15–41)
Albumin: 3.7 g/dL (ref 3.5–5.0)
Alkaline Phosphatase: 110 U/L (ref 38–126)
Anion gap: 21 — ABNORMAL HIGH (ref 5–15)
BUN: 14 mg/dL (ref 6–20)
CO2: 19 mmol/L — ABNORMAL LOW (ref 22–32)
Calcium: 8.8 mg/dL — ABNORMAL LOW (ref 8.9–10.3)
Chloride: 98 mmol/L (ref 98–111)
Creatinine, Ser: 1.23 mg/dL (ref 0.61–1.24)
GFR, Estimated: >60 mL/min (ref >60)
Glucose, Bld: 230 mg/dL — ABNORMAL HIGH (ref 70–99)
Potassium: 3.1 mmol/L — ABNORMAL LOW (ref 3.5–5.1)
Sodium: 138 mmol/L (ref 135–145)
Total Bilirubin: 0.8 mg/dL (ref 0.3–1.2)
Total Protein: 6.8 g/dL (ref 6.5–8.1)

## 2020-10-11 LAB — CBC
HCT: 59 % — ABNORMAL HIGH (ref 39.0–52.0)
Hemoglobin: 19.5 g/dL — ABNORMAL HIGH (ref 13.0–17.0)
MCH: 30.3 pg (ref 26.0–34.0)
MCHC: 33.1 g/dL (ref 30.0–36.0)
MCV: 91.6 fL (ref 80.0–100.0)
Platelets: 258 10*3/uL (ref 150–400)
RBC: 6.44 MIL/uL — ABNORMAL HIGH (ref 4.22–5.81)
RDW: 13 % (ref 11.5–15.5)
WBC: 15 10*3/uL — ABNORMAL HIGH (ref 4.0–10.5)
nRBC: 0 % (ref 0.0–0.2)

## 2020-10-11 LAB — LIPASE, BLOOD: Lipase: 551 U/L — ABNORMAL HIGH (ref 11–51)

## 2020-10-11 MED ORDER — HYDROMORPHONE HCL 1 MG/ML IJ SOLN
0.5000 mg | INTRAMUSCULAR | Status: DC | PRN
Start: 2020-10-11 — End: 2020-10-11
  Administered 2020-10-11 (×2): 0.5 mg via INTRAVENOUS
  Filled 2020-10-11 (×2): qty 0.5

## 2020-10-11 MED ORDER — LABETALOL HCL 5 MG/ML IV SOLN
10.0000 mg | INTRAVENOUS | Status: DC | PRN
  Administered 2020-10-11 – 2020-10-15 (×3): 10 mg via INTRAVENOUS
  Filled 2020-10-11 (×3): qty 4

## 2020-10-11 MED ORDER — POTASSIUM CHLORIDE 10 MEQ/100ML IV SOLN
10.0000 meq | INTRAVENOUS | Status: AC
  Administered 2020-10-11 (×3): 10 meq via INTRAVENOUS
  Filled 2020-10-11: qty 100

## 2020-10-11 MED ORDER — KETOROLAC TROMETHAMINE 30 MG/ML IJ SOLN
30.0000 mg | Freq: Four times a day (QID) | INTRAMUSCULAR | Status: DC
  Administered 2020-10-11 – 2020-10-14 (×12): 30 mg via INTRAVENOUS
  Filled 2020-10-11 (×12): qty 1

## 2020-10-11 MED ORDER — ENOXAPARIN SODIUM 40 MG/0.4ML ~~LOC~~ SOLN
40.0000 mg | SUBCUTANEOUS | Status: DC
  Administered 2020-10-11 – 2020-10-13 (×3): 40 mg via SUBCUTANEOUS
  Filled 2020-10-11 (×3): qty 0.4

## 2020-10-11 MED ORDER — HYDROMORPHONE HCL 1 MG/ML IJ SOLN
1.0000 mg | INTRAMUSCULAR | Status: DC | PRN
  Administered 2020-10-11 – 2020-10-14 (×9): 1 mg via INTRAVENOUS
  Filled 2020-10-11 (×10): qty 1

## 2020-10-11 NOTE — Progress Notes (Signed)
*  PRELIMINARY RESULTS* Echocardiogram 2D Echocardiogram has been performed.  Leavy Cella 10/11/2020, 2:51 PM

## 2020-10-11 NOTE — Progress Notes (Addendum)
[facility] Progress Note  1 Day Post-Op  Subjective: Distended and pain. Had chest pain last night but troponin and EKG negative. Repeat EKG this Am negative. HR elevated and BP elevated, has gas bubble and paucity of other small bowel gas that could be starting to be ileus.  He feels miserable.  Lipase ordered this am is elevated consistent with post ercp pancreatitis. Still with some bile from JP.   Objective: Vital signs in last 24 hours: Temp:  [97.7 F (36.5 C)-98.3 F (36.8 C)] 97.8 F (36.6 C) (04/08 1219) Pulse Rate:  [88-125] 125 (04/08 1219) Resp:  [15-20] 20 (04/08 0634) BP: (136-182)/(80-110) 170/99 (04/08 1219) SpO2:  [90 %-97 %] 92 % (04/08 1219) Last BM Date: 10/08/20  Intake/Output from previous day: 04/07 0701 - 04/08 0700 In: 1954.9 [P.O.:1020; I.V.:934.9] Out: 1570 [Urine:1275; Drains:295] Intake/Output this shift: Total I/O In: 170 [Other:170] Out: 75 [Drains:75]  General appearance: alert and mild distress Resp: shallow breaths GI: soft, distended, appropriately tender, staples c/d/i with no erythema, JP with bile drainage still after stent  Lab Results:  Recent Labs    10/10/20 0620 10/11/20 0419  WBC 14.2* 15.0*  HGB 17.6* 19.5*  HCT 53.2* 59.0*  PLT 248 258   BMET Recent Labs    10/10/20 0620 10/11/20 0419  NA 139 138  K 4.0 3.1*  CL 98 98  CO2 28 19*  GLUCOSE 115* 230*  BUN 13 14  CREATININE 1.15 1.23  CALCIUM 9.0 8.8*   PT/INR No results for input(s): LABPROT, INR in the last 72 hours.  Studies/Results: DG Abd 1 View  Result Date: 10/11/2020 CLINICAL DATA:  Nasogastric tube placement EXAM: ABDOMEN - 1 VIEW COMPARISON:  Earlier today FINDINGS: Enteric tube with tip over the stomach and side-port over the GE junction. Consider advancement by approximately 6 cm. Right upper quadrant drains and clips. Visualized bowel gas pattern is nonobstructive. IMPRESSION: The enteric tube tip is at the stomach with side port  over the GE junction. Consider advancement by ~6 cm. Electronically Signed   By: Monte Fantasia M.D.   On: 10/11/2020 12:17   DG Abd 1 View  Result Date: 10/11/2020 CLINICAL DATA:  Ileus, abdominal pain. EXAM: ABDOMEN - 1 VIEW COMPARISON:  None. FINDINGS: The bowel gas pattern is normal. No radio-opaque calculi or other significant radiographic abnormality are seen. IMPRESSION: Negative. Electronically Signed   By: Marijo Conception M.D.   On: 10/11/2020 08:52   MR 3D Recon At Scanner  Result Date: 10/10/2020 CLINICAL DATA:  History of cholecystitis with gallbladder empyema. Clinical concern for aberrant right hepatic duct system. EXAM: MRI ABDOMEN WITHOUT AND WITH CONTRAST (INCLUDING MRCP) TECHNIQUE: Multiplanar multisequence MR imaging of the abdomen was performed both before and after the administration of intravenous contrast. Heavily T2-weighted images of the biliary and pancreatic ducts were obtained, and three-dimensional MRCP images were rendered by post processing. CONTRAST:  20mL GADAVIST GADOBUTROL 1 MMOL/ML IV SOLN COMPARISON:  CT scan 09/17/2020 FINDINGS: Lower chest: Subsegmental atelectasis noted in the dependent lung bases. Hepatobiliary: Evaluation of liver is motion degraded, notably on the MRCP and postcontrast imaging. Within this limitation, no gross intrahepatic mass lesion evident. Heterogeneous signal intensity in the gallbladder fossa with some enhancement, compatible with the history of cholecystectomy yesterday. No substantial perihepatic fluid collection although there is some trace fluid along the inferior tip of the right liver. No intra or extrahepatic biliary duct dilatation. Although assessment of MRCP imaging is markedly degraded by motion artifact,  no aberrant right hepatic duct anatomy is discernible. Pancreas: No focal mass lesion. No dilatation of the main duct. No intraparenchymal cyst. No peripancreatic edema. Spleen:  No splenomegaly. No focal mass lesion.  Adrenals/Urinary Tract: No adrenal nodule or mass. Kidneys unremarkable. Stomach/Bowel: Stomach is unremarkable. No gastric wall thickening. No evidence of outlet obstruction. Duodenum is normally positioned as is the ligament of Treitz. No small bowel or colonic dilatation within the visualized abdomen. Vascular/Lymphatic: No abdominal aortic aneurysm. No abdominal lymphadenopathy. Other:  No substantial intraperitoneal free fluid. Musculoskeletal: No focal suspicious marrow enhancement within the visualized bony anatomy. IMPRESSION: 1. Status post cholecystectomy. Heterogeneous signal intensity in the gallbladder fossa likely reflects trace fluid/blood products from surgery, not unexpected. No gross fluid collection in the gallbladder fossa or along the liver capsule. 2. Trace fluid/edema seen along the inferior tip of the right liver. 3. Motion degraded MRCP imaging shows no intra or extrahepatic biliary duct dilatation. No choledocholithiasis. Hepatic duct confluence is somewhat patulous, but no discernible aberrant biliary duct anatomy. 4. Subsegmental atelectasis in the dependent lung bases. Electronically Signed   By: Misty Stanley M.D.   On: 10/10/2020 12:24   DG ERCP  Result Date: 10/10/2020 CLINICAL DATA:  High output from surgical drain at gallbladder fossa/subhepatic region suspected bile leak post cholecystectomy EXAM: ERCP with sphincterotomy and stent placement TECHNIQUE: Multiple spot images obtained with the fluoroscopic device and submitted for interpretation post-procedure. FLUOROSCOPY TIME:  Fluoroscopy Time: 2 minutes 21 seconds Radiation Exposure Index (if provided by the fluoroscopic device): 93.677 mGy Number of Acquired Spot Images: Multiple series and images COMPARISON:  MRCP 10/10/2020 FINDINGS: Patent normal caliber CBD. Prominent common hepatic duct without mass or stricture. Contrast extravasation at the surgical clips placed during cholecystectomy most consistent with leak at the  cystic duct stump. Cystic duct stump is only faintly visualized. The contrast appears to come from the direction of the hepatic duct confluence and cystic duct origin rather than from the adjacent liver parenchyma as expected from a duct of Luschka, best demonstrated on last cine series. No filling defects to suggest choledocholithiasis. Visualized intrahepatic biliary radicles unremarkable. IMPRESSION: Contrast extravasation at the surgical clips post cholecystectomy consistent with a leak at the cystic duct stump. Stent was placed with good drainage of bile reported intraoperatively though no images of the stent are submitted. These images were submitted for radiologic interpretation only. Please see the procedural report for the amount of contrast and the fluoroscopy time utilized. Electronically Signed   By: Lavonia Dana M.D.   On: 10/10/2020 15:55   MR ABDOMEN MRCP W WO CONTAST  Result Date: 10/10/2020 CLINICAL DATA:  History of cholecystitis with gallbladder empyema. Clinical concern for aberrant right hepatic duct system. EXAM: MRI ABDOMEN WITHOUT AND WITH CONTRAST (INCLUDING MRCP) TECHNIQUE: Multiplanar multisequence MR imaging of the abdomen was performed both before and after the administration of intravenous contrast. Heavily T2-weighted images of the biliary and pancreatic ducts were obtained, and three-dimensional MRCP images were rendered by post processing. CONTRAST:  42mL GADAVIST GADOBUTROL 1 MMOL/ML IV SOLN COMPARISON:  CT scan 09/17/2020 FINDINGS: Lower chest: Subsegmental atelectasis noted in the dependent lung bases. Hepatobiliary: Evaluation of liver is motion degraded, notably on the MRCP and postcontrast imaging. Within this limitation, no gross intrahepatic mass lesion evident. Heterogeneous signal intensity in the gallbladder fossa with some enhancement, compatible with the history of cholecystectomy yesterday. No substantial perihepatic fluid collection although there is some trace fluid  along the inferior tip of the right liver. No  intra or extrahepatic biliary duct dilatation. Although assessment of MRCP imaging is markedly degraded by motion artifact, no aberrant right hepatic duct anatomy is discernible. Pancreas: No focal mass lesion. No dilatation of the main duct. No intraparenchymal cyst. No peripancreatic edema. Spleen:  No splenomegaly. No focal mass lesion. Adrenals/Urinary Tract: No adrenal nodule or mass. Kidneys unremarkable. Stomach/Bowel: Stomach is unremarkable. No gastric wall thickening. No evidence of outlet obstruction. Duodenum is normally positioned as is the ligament of Treitz. No small bowel or colonic dilatation within the visualized abdomen. Vascular/Lymphatic: No abdominal aortic aneurysm. No abdominal lymphadenopathy. Other:  No substantial intraperitoneal free fluid. Musculoskeletal: No focal suspicious marrow enhancement within the visualized bony anatomy. IMPRESSION: 1. Status post cholecystectomy. Heterogeneous signal intensity in the gallbladder fossa likely reflects trace fluid/blood products from surgery, not unexpected. No gross fluid collection in the gallbladder fossa or along the liver capsule. 2. Trace fluid/edema seen along the inferior tip of the right liver. 3. Motion degraded MRCP imaging shows no intra or extrahepatic biliary duct dilatation. No choledocholithiasis. Hepatic duct confluence is somewhat patulous, but no discernible aberrant biliary duct anatomy. 4. Subsegmental atelectasis in the dependent lung bases. Electronically Signed   By: Misty Stanley M.D.   On: 10/10/2020 12:24    Anti-infectives: Anti-infectives (From admission, onward)   Start     Dose/Rate Route Frequency Ordered Stop   10/09/20 1215  cefTRIAXone (ROCEPHIN) 2 g in sodium chloride 0.9 % 100 mL IVPB        2 g 200 mL/hr over 30 Minutes Intravenous Every 24 hours 10/09/20 1201     10/09/20 0630  cefoTEtan (CEFOTAN) 2 g in sodium chloride 0.9 % 100 mL IVPB        2 g 200  mL/hr over 30 Minutes Intravenous On call to O.R. 10/09/20 0626 10/09/20 1416   10/09/20 0630  sodium chloride 0.9 % with cefoTEtan (CEFOTAN) ADS Med       Note to Pharmacy: Donnamarie Rossetti   : cabinet override      10/09/20 0630 10/09/20 1416      Assessment/Plan: Mr. Hoe is a 50 yo s/l lap cholecystectomy for empyema of the gallbladder, complicated by cystic duct leak s/p ERCP with stent and sphincterotomy, complicated by pancreatitis post ERCP. PRN for pain IS, OOB Tachycardia, repeat EKG all look wnl, ECHO given need for fluids ordered NPO except sips and chips, NG in place for comfort likely ileus Pancreatitis, lipase tomorrow, monitor H&H elevated, concentration likely Leukocytosis stable, ceftriaxone post op K replaced, LR increased to 150 SCDs, added lovenox for prophylaxis  Discussed JP drain output with GI. Discussed that liver edge was draining bile too but no obvious large ductal system in that area on the MRCP. JP continue for now as controlling the output. Stent in place for cystic duct leak. Expect JP drainage should decrease with time. Discussed with radiology too to see if continues best plan. Dr. Thornton Papas is going to talk with IR to see if they have any other concerns or thoughts based on MRCP and ERCP from yesterday. Likely ERCP would be the only thing that could be repeated if continues to be leak per IR and right system would have to be isolated and injected. Hopefully output/ leak improves over next few days.   Updated patient and sister at bedside. Updated father by phone.    LOS: 1 day    Virl Cagey 10/11/2020

## 2020-10-11 NOTE — Progress Notes (Signed)
[facility]  Asked pathology to look back at the skin and fat from the reticular abdominal lesion. They will get derm path to look at it.  Curlene Labrum, MD [facility] [address] [place], [place] [address] [phone] (office)

## 2020-10-11 NOTE — Progress Notes (Addendum)
MD notified that patient believed that mid lower chest pain was related to gas pain. Admin simethicone at 2031 and also gave oxycodone 10mg  at 2042. Patient also educated on the importance of ambulating as much as he could tolerate to help relieve gas pain. Will continue monitor.

## 2020-10-11 NOTE — Anesthesia Postprocedure Evaluation (Signed)
Anesthesia Post Note  Patient: Steve Romero  Procedure(s) Performed: ENDOSCOPIC RETROGRADE CHOLANGIOPANCREATOGRAPHY (ERCP) (N/A Esophagus) BILIARY STENT PLACEMENT (N/A Esophagus) SPHINCTEROTOMY (N/A Esophagus)  Patient location during evaluation: Phase II Anesthesia Type: General Level of consciousness: awake Pain management: pain level controlled Vital Signs Assessment: post-procedure vital signs reviewed and stable Respiratory status: spontaneous breathing and respiratory function stable Cardiovascular status: blood pressure returned to baseline and stable Postop Assessment: no headache and no apparent nausea or vomiting Anesthetic complications: no Comments: Late entry   No complications documented.   Last Vitals:  Vitals:   10/11/20 0433 10/11/20 0634  BP: (!) 181/97 (!) 182/105  Pulse: (!) 116 (!) 101  Resp: 20 20  Temp:  36.5 C  SpO2: 94% 92%    Last Pain:  Vitals:   10/11/20 0657  TempSrc:   PainSc: [place]

## 2020-10-11 NOTE — Progress Notes (Signed)
   10/11/20 0433  Assess: MEWS Score  BP (!) 181/97  Pulse Rate (!) 116  Resp 20  SpO2 94 %  O2 Device Room Air  Assess: MEWS Score  MEWS Temp 0  MEWS Systolic 0  MEWS Pulse 2  MEWS RR 0  MEWS LOC 0  MEWS Score 2  MEWS Score Color Yellow   MD notified of patients MEWS score of 2 due to patients HR. Patient was given metoprolol and new orders of diluadid and a KUB was ordered. Will continue to monitor patient per protocol

## 2020-10-11 NOTE — Progress Notes (Signed)
MD notified that simethicone did not relieve gas pain much. Ask MD for suggestions. MD ordered bisacodyl suppository. Admin suppository at 2248 and also placed heat pack per MD. [place] patient at 2304 patient reported to not feeling much better and has yet to pass his bowels.Morphine given at 2304. Patient didn't want any other intervention at this time. Will continue to monitor.

## 2020-10-11 NOTE — Progress Notes (Addendum)
Subjective: Burping. Feels gassy, abdominal discomfort diffusely. Feels like Tums would help. Does not feel well today.  ERCP yesterday.   Objective: Vital signs in last 24 hours: Temp:  [97.7 F (36.5 C)-98.8 F (37.1 C)] 97.7 F (36.5 C) (04/08 0634) Pulse Rate:  [88-116] 113 (04/08 0828) Resp:  [15-20] 20 (04/08 0634) BP: (136-182)/(80-110) 162/110 (04/08 0828) SpO2:  [90 %-97 %] 95 % (04/08 0828) Last BM Date: 10/08/20 General:   Alert and oriented, pleasant Head:  Normocephalic and atraumatic. Abdomen:  Bowel sounds hypoactive, abdomen distended, TTP diffusely, JP drain right-sided with bile drainage Neurologic:  Alert and  oriented x4  Intake/Output from previous day: 04/07 0701 - 04/08 0700 In: 1954.9 [P.O.:1020; I.V.:934.9] Out: 1570 [Urine:1275; Drains:295] Intake/Output this shift: No intake/output data recorded.  Lab Results: Recent Labs    10/10/20 0620 10/11/20 0419  WBC 14.2* 15.0*  HGB 17.6* 19.5*  HCT 53.2* 59.0*  PLT 248 258   BMET Recent Labs    10/10/20 0620 10/11/20 0419  NA 139 138  K 4.0 3.1*  CL 98 98  CO2 28 19*  GLUCOSE 115* 230*  BUN 13 14  CREATININE 1.15 1.23  CALCIUM 9.0 8.8*   LFT Recent Labs    10/10/20 0620 10/11/20 0419  PROT 7.0 6.8  ALBUMIN 3.9 3.7  AST 38 35  ALT 77* 65*  ALKPHOS 110 110  BILITOT 0.9 0.8  BILIDIR 0.2  --   IBILI 0.7  --     Studies/Results: DG Abd 1 View  Result Date: 10/11/2020 CLINICAL DATA:  Ileus, abdominal pain. EXAM: ABDOMEN - 1 VIEW COMPARISON:  None. FINDINGS: The bowel gas pattern is normal. No radio-opaque calculi or other significant radiographic abnormality are seen. IMPRESSION: Negative. Electronically Signed   By: Marijo Conception M.D.   On: 10/11/2020 08:52   MR 3D Recon At Scanner  Result Date: 10/10/2020 CLINICAL DATA:  History of cholecystitis with gallbladder empyema. Clinical concern for aberrant right hepatic duct system. EXAM: MRI ABDOMEN WITHOUT AND WITH CONTRAST  (INCLUDING MRCP) TECHNIQUE: Multiplanar multisequence MR imaging of the abdomen was performed both before and after the administration of intravenous contrast. Heavily T2-weighted images of the biliary and pancreatic ducts were obtained, and three-dimensional MRCP images were rendered by post processing. CONTRAST:  83mL GADAVIST GADOBUTROL 1 MMOL/ML IV SOLN COMPARISON:  CT scan 09/17/2020 FINDINGS: Lower chest: Subsegmental atelectasis noted in the dependent lung bases. Hepatobiliary: Evaluation of liver is motion degraded, notably on the MRCP and postcontrast imaging. Within this limitation, no gross intrahepatic mass lesion evident. Heterogeneous signal intensity in the gallbladder fossa with some enhancement, compatible with the history of cholecystectomy yesterday. No substantial perihepatic fluid collection although there is some trace fluid along the inferior tip of the right liver. No intra or extrahepatic biliary duct dilatation. Although assessment of MRCP imaging is markedly degraded by motion artifact, no aberrant right hepatic duct anatomy is discernible. Pancreas: No focal mass lesion. No dilatation of the main duct. No intraparenchymal cyst. No peripancreatic edema. Spleen:  No splenomegaly. No focal mass lesion. Adrenals/Urinary Tract: No adrenal nodule or mass. Kidneys unremarkable. Stomach/Bowel: Stomach is unremarkable. No gastric wall thickening. No evidence of outlet obstruction. Duodenum is normally positioned as is the ligament of Treitz. No small bowel or colonic dilatation within the visualized abdomen. Vascular/Lymphatic: No abdominal aortic aneurysm. No abdominal lymphadenopathy. Other:  No substantial intraperitoneal free fluid. Musculoskeletal: No focal suspicious marrow enhancement within the visualized bony anatomy. IMPRESSION: 1. Status post  cholecystectomy. Heterogeneous signal intensity in the gallbladder fossa likely reflects trace fluid/blood products from surgery, not unexpected.  No gross fluid collection in the gallbladder fossa or along the liver capsule. 2. Trace fluid/edema seen along the inferior tip of the right liver. 3. Motion degraded MRCP imaging shows no intra or extrahepatic biliary duct dilatation. No choledocholithiasis. Hepatic duct confluence is somewhat patulous, but no discernible aberrant biliary duct anatomy. 4. Subsegmental atelectasis in the dependent lung bases. Electronically Signed   By: Misty Stanley M.D.   On: 10/10/2020 12:24   DG ERCP  Result Date: 10/10/2020 CLINICAL DATA:  High output from surgical drain at gallbladder fossa/subhepatic region suspected bile leak post cholecystectomy EXAM: ERCP with sphincterotomy and stent placement TECHNIQUE: Multiple spot images obtained with the fluoroscopic device and submitted for interpretation post-procedure. FLUOROSCOPY TIME:  Fluoroscopy Time: 2 minutes 21 seconds Radiation Exposure Index (if provided by the fluoroscopic device): 93.677 mGy Number of Acquired Spot Images: Multiple series and images COMPARISON:  MRCP 10/10/2020 FINDINGS: Patent normal caliber CBD. Prominent common hepatic duct without mass or stricture. Contrast extravasation at the surgical clips placed during cholecystectomy most consistent with leak at the cystic duct stump. Cystic duct stump is only faintly visualized. The contrast appears to come from the direction of the hepatic duct confluence and cystic duct origin rather than from the adjacent liver parenchyma as expected from a duct of Luschka, best demonstrated on last cine series. No filling defects to suggest choledocholithiasis. Visualized intrahepatic biliary radicles unremarkable. IMPRESSION: Contrast extravasation at the surgical clips post cholecystectomy consistent with a leak at the cystic duct stump. Stent was placed with good drainage of bile reported intraoperatively though no images of the stent are submitted. These images were submitted for radiologic interpretation only.  Please see the procedural report for the amount of contrast and the fluoroscopy time utilized. Electronically Signed   By: Lavonia Dana M.D.   On: 10/10/2020 15:55   MR ABDOMEN MRCP W WO CONTAST  Result Date: 10/10/2020 CLINICAL DATA:  History of cholecystitis with gallbladder empyema. Clinical concern for aberrant right hepatic duct system. EXAM: MRI ABDOMEN WITHOUT AND WITH CONTRAST (INCLUDING MRCP) TECHNIQUE: Multiplanar multisequence MR imaging of the abdomen was performed both before and after the administration of intravenous contrast. Heavily T2-weighted images of the biliary and pancreatic ducts were obtained, and three-dimensional MRCP images were rendered by post processing. CONTRAST:  46mL GADAVIST GADOBUTROL 1 MMOL/ML IV SOLN COMPARISON:  CT scan 09/17/2020 FINDINGS: Lower chest: Subsegmental atelectasis noted in the dependent lung bases. Hepatobiliary: Evaluation of liver is motion degraded, notably on the MRCP and postcontrast imaging. Within this limitation, no gross intrahepatic mass lesion evident. Heterogeneous signal intensity in the gallbladder fossa with some enhancement, compatible with the history of cholecystectomy yesterday. No substantial perihepatic fluid collection although there is some trace fluid along the inferior tip of the right liver. No intra or extrahepatic biliary duct dilatation. Although assessment of MRCP imaging is markedly degraded by motion artifact, no aberrant right hepatic duct anatomy is discernible. Pancreas: No focal mass lesion. No dilatation of the main duct. No intraparenchymal cyst. No peripancreatic edema. Spleen:  No splenomegaly. No focal mass lesion. Adrenals/Urinary Tract: No adrenal nodule or mass. Kidneys unremarkable. Stomach/Bowel: Stomach is unremarkable. No gastric wall thickening. No evidence of outlet obstruction. Duodenum is normally positioned as is the ligament of Treitz. No small bowel or colonic dilatation within the visualized abdomen.  Vascular/Lymphatic: No abdominal aortic aneurysm. No abdominal lymphadenopathy. Other:  No substantial intraperitoneal  free fluid. Musculoskeletal: No focal suspicious marrow enhancement within the visualized bony anatomy. IMPRESSION: 1. Status post cholecystectomy. Heterogeneous signal intensity in the gallbladder fossa likely reflects trace fluid/blood products from surgery, not unexpected. No gross fluid collection in the gallbladder fossa or along the liver capsule. 2. Trace fluid/edema seen along the inferior tip of the right liver. 3. Motion degraded MRCP imaging shows no intra or extrahepatic biliary duct dilatation. No choledocholithiasis. Hepatic duct confluence is somewhat patulous, but no discernible aberrant biliary duct anatomy. 4. Subsegmental atelectasis in the dependent lung bases. Electronically Signed   By: Misty Stanley M.D.   On: 10/10/2020 12:24    Assessment: 50 year old male with history of chronic RUQ pain, bloating, and nausea that had worsened over the past few weeks, worsened by fatty foods, [place] with gallstones and fatty liver with incidental finding of small well-circumscribed hyperechoic structure within the subcutaneous soft tissue of the right ventral abdominal wall, undergoing elective cholecystectomy on 4/6 by Dr. Constance Haw that was difficult given infection and inflammation. Empyema of gallbladder noted, torn cystic duct with 1 clip placed across remnant of cystic duct and JP drain placed.   ERCP yesterday with bile leak found at cystic duct remnant, s/p biliary sphincterotomy and plastic stent placed into CBD. The PD was not cannulated or filled with contrast.   Today, patient notes gas discomfort, distended abdomen, with abdominal xray negative. Feels unwell overall.  In light of ERCP yesterday, tachycardia, overall constellation of symptoms, will check lipase today.      Plan: Check lipase Repeat ERCP in 6-8 weeks with Dr. Laural Golden Continue PPI Further  recommendations to follow   Annitta Needs, PhD, ANP-BC [facility]    LOS: 1 day    10/11/2020, 10:22 AM   Addendum 10/11/20 at 1130: lipase resulted 551. Currently receiving LR at 27. Discussed with Dr. Constance Haw and increasing to 150 ml/hour for now. ECHO ordered by Dr. Constance Haw due to dyspnea. Holding off on imaging unless worsening abdominal pain. NG tube placed per Dr. Constance Haw.    Annitta Needs, PhD, ANP-BC [facility]

## 2020-10-12 ENCOUNTER — Inpatient Hospital Stay (HOSPITAL_COMMUNITY): Payer: Medicaid Other

## 2020-10-12 LAB — CBC WITH DIFFERENTIAL/PLATELET
Abs Immature Granulocytes: 0.11 10*3/uL — ABNORMAL HIGH (ref 0.00–0.07)
Basophils Absolute: 0.1 10*3/uL (ref 0.0–0.1)
Basophils Relative: 0 %
Eosinophils Absolute: 0 10*3/uL (ref 0.0–0.5)
Eosinophils Relative: 0 %
HCT: 57.5 % — ABNORMAL HIGH (ref 39.0–52.0)
Hemoglobin: 19.9 g/dL — ABNORMAL HIGH (ref 13.0–17.0)
Immature Granulocytes: 1 %
Lymphocytes Relative: 9 %
Lymphs Abs: 1.7 10*3/uL (ref 0.7–4.0)
MCH: 31.1 pg (ref 26.0–34.0)
MCHC: 34.6 g/dL (ref 30.0–36.0)
MCV: 89.8 fL (ref 80.0–100.0)
Monocytes Absolute: 1 10*3/uL (ref 0.1–1.0)
Monocytes Relative: 5 %
Neutro Abs: 16.7 10*3/uL — ABNORMAL HIGH (ref 1.7–7.7)
Neutrophils Relative %: 85 %
Platelets: 208 10*3/uL (ref 150–400)
RBC: 6.4 MIL/uL — ABNORMAL HIGH (ref 4.22–5.81)
RDW: 13.2 % (ref 11.5–15.5)
WBC: 19.7 10*3/uL — ABNORMAL HIGH (ref 4.0–10.5)
nRBC: 0 % (ref 0.0–0.2)

## 2020-10-12 LAB — BASIC METABOLIC PANEL
Anion gap: 12 (ref 5–15)
BUN: 28 mg/dL — ABNORMAL HIGH (ref 6–20)
CO2: 21 mmol/L — ABNORMAL LOW (ref 22–32)
Calcium: 7.7 mg/dL — ABNORMAL LOW (ref 8.9–10.3)
Chloride: 102 mmol/L (ref 98–111)
Creatinine, Ser: 1.39 mg/dL — ABNORMAL HIGH (ref 0.61–1.24)
GFR, Estimated: >60 mL/min (ref >60)
Glucose, Bld: 139 mg/dL — ABNORMAL HIGH (ref 70–99)
Potassium: 4.3 mmol/L (ref 3.5–5.1)
Sodium: 135 mmol/L (ref 135–145)

## 2020-10-12 LAB — LIPASE, BLOOD: Lipase: 639 U/L — ABNORMAL HIGH (ref 11–51)

## 2020-10-12 LAB — HEPATIC FUNCTION PANEL
ALT: 43 U/L (ref 0–44)
AST: 33 U/L (ref 15–41)
Albumin: 3.2 g/dL — ABNORMAL LOW (ref 3.5–5.0)
Alkaline Phosphatase: 93 U/L (ref 38–126)
Bilirubin, Direct: 1.4 mg/dL — ABNORMAL HIGH (ref 0.0–0.2)
Indirect Bilirubin: 1.4 mg/dL — ABNORMAL HIGH (ref 0.3–0.9)
Total Bilirubin: 2.8 mg/dL — ABNORMAL HIGH (ref 0.3–1.2)
Total Protein: 6.2 g/dL — ABNORMAL LOW (ref 6.5–8.1)

## 2020-10-12 LAB — PROCALCITONIN: Procalcitonin: 5.33 ng/mL

## 2020-10-12 MED ORDER — SODIUM CHLORIDE 0.9 % IV SOLN
1.0000 g | Freq: Three times a day (TID) | INTRAVENOUS | Status: DC
  Administered 2020-10-12 – 2020-10-15 (×10): 1 g via INTRAVENOUS
  Filled 2020-10-12 (×10): qty 1

## 2020-10-12 MED ORDER — LACTATED RINGERS IV BOLUS
500.0000 mL | Freq: Once | INTRAVENOUS | Status: AC
  Administered 2020-10-12: 500 mL via INTRAVENOUS

## 2020-10-12 MED ORDER — LACTATED RINGERS IV BOLUS
1000.0000 mL | Freq: Once | INTRAVENOUS | Status: AC
  Administered 2020-10-12: 1000 mL via INTRAVENOUS

## 2020-10-12 NOTE — Progress Notes (Signed)
   10/12/20 1102  Vitals  BP (!) 149/95  MAP (mmHg) 112  BP Location Right Arm  BP Method Automatic  Patient Position (if appropriate) Lying  Pulse Rate (!) 114  Pulse Rate Source Monitor  MEWS COLOR  MEWS Score Color Yellow  Oxygen Therapy  SpO2 92 %  Pain Assessment  Pain Scale 0-10  Pain Score 3  PCA/Epidural/Spinal Assessment  Respiratory Pattern Regular;Unlabored  MEWS Score  MEWS Temp 0  MEWS Systolic 0  MEWS Pulse 2  MEWS RR 0  MEWS LOC 0  MEWS Score 2  Provider Notification  Provider Name/Title Dr. Constance Haw  Date Provider Notified 10/12/20  Time Provider Notified 7209  Notification Type  (message)  Notification Reason Change in status (yellow muse)  Provider response Other (Comment) (message)  Date of Provider Response 10/12/20  Time of Provider Response 1120

## 2020-10-12 NOTE — Progress Notes (Signed)
Pharmacy Antibiotic Note  Beren Yniguez is a 50 y.o. male admitted on 10/09/2020 with intra-abdominal infection.  Pharmacy has been consulted for Merrem dosing. Empyema of the gallbladder, complicated by pancreatitis post ERCP.   Plan: Merrem 1gm IV q8h x 5 days F/U cxs and clinical progress Monitor V/S, labs  Height: 5\' 10"  (177.8 cm) Weight: 130.8 kg (288 lb 5.8 oz) IBW/kg (Calculated) : 73  Temp (24hrs), Avg:97.9 F (36.6 C), Min:97.8 F (36.6 C), Max:97.9 F (36.6 C)  Recent Labs  Lab 10/10/20 0620 10/11/20 0419 10/12/20 0408  WBC 14.2* 15.0* 19.7*  CREATININE 1.15 1.23 1.39*    Estimated Creatinine Clearance: 87.4 mL/min (A) (by C-G formula based on SCr of 1.39 mg/dL (H)).    Allergies  Allergen Reactions  . Desipramine Nausea Only and Rash    Antimicrobials this admission: merrem 4/9>>  Ceftriaxone 4/6 >> 4/9  Microbiology results: No cultures  Thank you for allowing pharmacy to be a part of this patient's care.  Isac Sarna, BS Pharm D, [place] Clinical Pharmacist Pager [phone] 10/12/2020 9:26 AM

## 2020-10-12 NOTE — Progress Notes (Addendum)
Subjective: Patient states he is feeling better today.  No abdominal pain currently.  Wishes to have his NG tube removed.  Significant output over 1000 cc to JP drain over the last 24 hours  Objective: Vital signs in last 24 hours: Temp:  [97.8 F (36.6 C)-97.9 F (36.6 C)] 97.9 F (36.6 C) (04/09 0628) Pulse Rate:  [94-125] 110 (04/09 0628) Resp:  [18-20] 20 (04/09 0628) BP: (127-175)/(94-121) 159/113 (04/09 0628) SpO2:  [90 %-95 %] 91 % (04/09 0628) Last BM Date: 10/11/20 General:   Alert and oriented, pleasant Head:  Normocephalic and atraumatic. Eyes:  No icterus, sclera clear. Conjuctiva pink.  Mouth:  Without lesions, mucosa pink and moist.  Neck:  Supple, without thyromegaly or masses.  Abdomen:  Bowel sounds present, soft, non-tender, non-distended. No HSM or hernias noted. No rebound or guarding. No masses appreciated  Pulses:  Normal pulses noted. Extremities:  Without clubbing or edema. Neurologic:  Alert and  oriented x4;  grossly normal neurologically. Skin:  Warm and dry, intact without significant lesions.  Cervical Nodes:  No significant cervical adenopathy. Psych:  Alert and cooperative. Normal mood and affect.  Intake/Output from previous day: 04/08 0701 - 04/09 0700 In: 3142.1 [I.V.:2109.1; IV Piggyback:863] Out: 5366 [Urine:300; Drains:1005] Intake/Output this shift: Total I/O In: 0  Out: 291 [Urine:150; Drains:140; Stool:1]  Lab Results: Recent Labs    10/10/20 0620 10/11/20 0419 10/12/20 0408  WBC 14.2* 15.0* 19.7*  HGB 17.6* 19.5* 19.9*  HCT 53.2* 59.0* 57.5*  PLT 248 258 208   BMET Recent Labs    10/10/20 0620 10/11/20 0419 10/12/20 0408  NA 139 138 135  K 4.0 3.1* 4.3  CL 98 98 102  CO2 28 19* 21*  GLUCOSE 115* 230* 139*  BUN 13 14 28*  CREATININE 1.15 1.23 1.39*  CALCIUM 9.0 8.8* 7.7*   LFT Recent Labs    10/10/20 0620 10/11/20 0419 10/12/20 0408  PROT 7.0 6.8 6.2*  ALBUMIN 3.9 3.7 3.2*  AST 38 35 33  ALT 77* 65* 43   ALKPHOS 110 110 93  BILITOT 0.9 0.8 2.8*  BILIDIR 0.2  --  1.4*  IBILI 0.7  --  1.4*   PT/INR No results for input(s): LABPROT, INR in the last 72 hours. Hepatitis Panel No results for input(s): HEPBSAG, HCVAB, HEPAIGM, HEPBIGM in the last 72 hours.   Studies/Results: DG Abd 1 View  Result Date: 10/11/2020 CLINICAL DATA:  Nasogastric tube placement EXAM: ABDOMEN - 1 VIEW COMPARISON:  Earlier today FINDINGS: Enteric tube with tip over the stomach and side-port over the GE junction. Consider advancement by approximately 6 cm. Right upper quadrant drains and clips. Visualized bowel gas pattern is nonobstructive. IMPRESSION: The enteric tube tip is at the stomach with side port over the GE junction. Consider advancement by ~6 cm. Electronically Signed   By: Monte Fantasia M.D.   On: 10/11/2020 12:17   DG Abd 1 View  Result Date: 10/11/2020 CLINICAL DATA:  Ileus, abdominal pain. EXAM: ABDOMEN - 1 VIEW COMPARISON:  None. FINDINGS: The bowel gas pattern is normal. No radio-opaque calculi or other significant radiographic abnormality are seen. IMPRESSION: Negative. Electronically Signed   By: Marijo Conception M.D.   On: 10/11/2020 08:52   MR 3D Recon At Scanner  Result Date: 10/10/2020 CLINICAL DATA:  History of cholecystitis with gallbladder empyema. Clinical concern for aberrant right hepatic duct system. EXAM: MRI ABDOMEN WITHOUT AND WITH CONTRAST (INCLUDING MRCP) TECHNIQUE: Multiplanar multisequence MR imaging of the abdomen  was performed both before and after the administration of intravenous contrast. Heavily T2-weighted images of the biliary and pancreatic ducts were obtained, and three-dimensional MRCP images were rendered by post processing. CONTRAST:  69mL GADAVIST GADOBUTROL 1 MMOL/ML IV SOLN COMPARISON:  CT scan 09/17/2020 FINDINGS: Lower chest: Subsegmental atelectasis noted in the dependent lung bases. Hepatobiliary: Evaluation of liver is motion degraded, notably on the MRCP and  postcontrast imaging. Within this limitation, no gross intrahepatic mass lesion evident. Heterogeneous signal intensity in the gallbladder fossa with some enhancement, compatible with the history of cholecystectomy yesterday. No substantial perihepatic fluid collection although there is some trace fluid along the inferior tip of the right liver. No intra or extrahepatic biliary duct dilatation. Although assessment of MRCP imaging is markedly degraded by motion artifact, no aberrant right hepatic duct anatomy is discernible. Pancreas: No focal mass lesion. No dilatation of the main duct. No intraparenchymal cyst. No peripancreatic edema. Spleen:  No splenomegaly. No focal mass lesion. Adrenals/Urinary Tract: No adrenal nodule or mass. Kidneys unremarkable. Stomach/Bowel: Stomach is unremarkable. No gastric wall thickening. No evidence of outlet obstruction. Duodenum is normally positioned as is the ligament of Treitz. No small bowel or colonic dilatation within the visualized abdomen. Vascular/Lymphatic: No abdominal aortic aneurysm. No abdominal lymphadenopathy. Other:  No substantial intraperitoneal free fluid. Musculoskeletal: No focal suspicious marrow enhancement within the visualized bony anatomy. IMPRESSION: 1. Status post cholecystectomy. Heterogeneous signal intensity in the gallbladder fossa likely reflects trace fluid/blood products from surgery, not unexpected. No gross fluid collection in the gallbladder fossa or along the liver capsule. 2. Trace fluid/edema seen along the inferior tip of the right liver. 3. Motion degraded MRCP imaging shows no intra or extrahepatic biliary duct dilatation. No choledocholithiasis. Hepatic duct confluence is somewhat patulous, but no discernible aberrant biliary duct anatomy. 4. Subsegmental atelectasis in the dependent lung bases. Electronically Signed   By: Misty Stanley M.D.   On: 10/10/2020 12:24   DG ERCP  Result Date: 10/10/2020 CLINICAL DATA:  High output from  surgical drain at gallbladder fossa/subhepatic region suspected bile leak post cholecystectomy EXAM: ERCP with sphincterotomy and stent placement TECHNIQUE: Multiple spot images obtained with the fluoroscopic device and submitted for interpretation post-procedure. FLUOROSCOPY TIME:  Fluoroscopy Time: 2 minutes 21 seconds Radiation Exposure Index (if provided by the fluoroscopic device): 93.677 mGy Number of Acquired Spot Images: Multiple series and images COMPARISON:  MRCP 10/10/2020 FINDINGS: Patent normal caliber CBD. Prominent common hepatic duct without mass or stricture. Contrast extravasation at the surgical clips placed during cholecystectomy most consistent with leak at the cystic duct stump. Cystic duct stump is only faintly visualized. The contrast appears to come from the direction of the hepatic duct confluence and cystic duct origin rather than from the adjacent liver parenchyma as expected from a duct of Luschka, best demonstrated on last cine series. No filling defects to suggest choledocholithiasis. Visualized intrahepatic biliary radicles unremarkable. IMPRESSION: Contrast extravasation at the surgical clips post cholecystectomy consistent with a leak at the cystic duct stump. Stent was placed with good drainage of bile reported intraoperatively though no images of the stent are submitted. These images were submitted for radiologic interpretation only. Please see the procedural report for the amount of contrast and the fluoroscopy time utilized. Electronically Signed   By: Lavonia Dana M.D.   On: 10/10/2020 15:55   MR ABDOMEN MRCP W WO CONTAST  Result Date: 10/10/2020 CLINICAL DATA:  History of cholecystitis with gallbladder empyema. Clinical concern for aberrant right hepatic duct system. EXAM:  MRI ABDOMEN WITHOUT AND WITH CONTRAST (INCLUDING MRCP) TECHNIQUE: Multiplanar multisequence MR imaging of the abdomen was performed both before and after the administration of intravenous contrast. Heavily  T2-weighted images of the biliary and pancreatic ducts were obtained, and three-dimensional MRCP images were rendered by post processing. CONTRAST:  62mL GADAVIST GADOBUTROL 1 MMOL/ML IV SOLN COMPARISON:  CT scan 09/17/2020 FINDINGS: Lower chest: Subsegmental atelectasis noted in the dependent lung bases. Hepatobiliary: Evaluation of liver is motion degraded, notably on the MRCP and postcontrast imaging. Within this limitation, no gross intrahepatic mass lesion evident. Heterogeneous signal intensity in the gallbladder fossa with some enhancement, compatible with the history of cholecystectomy yesterday. No substantial perihepatic fluid collection although there is some trace fluid along the inferior tip of the right liver. No intra or extrahepatic biliary duct dilatation. Although assessment of MRCP imaging is markedly degraded by motion artifact, no aberrant right hepatic duct anatomy is discernible. Pancreas: No focal mass lesion. No dilatation of the main duct. No intraparenchymal cyst. No peripancreatic edema. Spleen:  No splenomegaly. No focal mass lesion. Adrenals/Urinary Tract: No adrenal nodule or mass. Kidneys unremarkable. Stomach/Bowel: Stomach is unremarkable. No gastric wall thickening. No evidence of outlet obstruction. Duodenum is normally positioned as is the ligament of Treitz. No small bowel or colonic dilatation within the visualized abdomen. Vascular/Lymphatic: No abdominal aortic aneurysm. No abdominal lymphadenopathy. Other:  No substantial intraperitoneal free fluid. Musculoskeletal: No focal suspicious marrow enhancement within the visualized bony anatomy. IMPRESSION: 1. Status post cholecystectomy. Heterogeneous signal intensity in the gallbladder fossa likely reflects trace fluid/blood products from surgery, not unexpected. No gross fluid collection in the gallbladder fossa or along the liver capsule. 2. Trace fluid/edema seen along the inferior tip of the right liver. 3. Motion degraded  MRCP imaging shows no intra or extrahepatic biliary duct dilatation. No choledocholithiasis. Hepatic duct confluence is somewhat patulous, but no discernible aberrant biliary duct anatomy. 4. Subsegmental atelectasis in the dependent lung bases. Electronically Signed   By: Misty Stanley M.D.   On: 10/10/2020 12:24   ECHOCARDIOGRAM COMPLETE  Result Date: 10/11/2020    ECHOCARDIOGRAM REPORT   Patient Name:   KARVER FADDEN Date of Exam: 10/11/2020 Medical Rec #:  [id number]      Height:       70.0 in Accession #:    [id number]     Weight:       288.4 lb Date of Birth:  February 06, 1971      BSA:          2.439 m Patient Age:    50 years       BP:           175/102 mmHg Patient Gender: M              HR:           95 bpm. Exam Location:  [facility] Procedure: 2D Echo Indications:    Dyspnea R06.00  History:        Patient has prior history of Echocardiogram examinations, most                 recent 05/29/2015. Risk Factors:Current Smoker, Hypertension and                 Dyslipidemia. Schizoaffective disorder , Calculus of                 gallbladder.  Sonographer:    Leavy Cella RDCS (AE) Referring Phys: [phone] [place]  1. Poor  acoustic windows limit study.  2. Cannot fully evaluate regional wall motion as endocardium is difficult to see.. Left ventricular ejection fraction, by estimation, is >75%. The left ventricle has hyperdynamic function. There is mild left ventricular hypertrophy. Left ventricular diastolic parameters were normal.  3. Right ventricular systolic function is normal. The right ventricular size is normal.  4. The mitral valve is normal in structure. Trivial mitral valve regurgitation.  5. The aortic valve is abnormal. Aortic valve regurgitation is not visualized. Mild aortic valve sclerosis is present, with no evidence of aortic valve stenosis.  6. The inferior vena cava is normal in size with greater than 50% respiratory variability, suggesting right atrial pressure of 3 mmHg.  FINDINGS  Left Ventricle: Cannot fully evaluate regional wall motion as endocardium is difficult to see. Left ventricular ejection fraction, by estimation, is >75%. The left ventricle has hyperdynamic function. The left ventricular internal cavity size was small.  There is mild left ventricular hypertrophy. Left ventricular diastolic parameters were normal. Right Ventricle: The right ventricular size is normal. Right vetricular wall thickness was not assessed. Right ventricular systolic function is normal. Left Atrium: Left atrial size was normal in size. Right Atrium: Right atrial size was normal in size. Pericardium: Trivial pericardial effusion is present. Mitral Valve: The mitral valve is normal in structure. Trivial mitral valve regurgitation. Tricuspid Valve: The tricuspid valve is grossly normal. Tricuspid valve regurgitation is trivial. Aortic Valve: The aortic valve is abnormal. Aortic valve regurgitation is not visualized. Mild aortic valve sclerosis is present, with no evidence of aortic valve stenosis. Pulmonic Valve: The pulmonic valve was not well visualized. Pulmonic valve regurgitation is not visualized. Aorta: The aortic root is normal in size and structure. Venous: The inferior vena cava is normal in size with greater than 50% respiratory variability, suggesting right atrial pressure of 3 mmHg. IAS/Shunts: No atrial level shunt detected by color flow Doppler.  LEFT VENTRICLE PLAX 2D LVIDd:         2.33 cm     Diastology LVIDs:         1.85 cm     LV e' medial:    6.74 cm/s LV PW:         1.20 cm     LV E/e' medial:  7.1 LV IVS:        1.21 cm     LV e' lateral:   10.10 cm/s LVOT diam:     2.10 cm     LV E/e' lateral: 4.7 LVOT Area:     3.46 cm  LV Volumes (MOD) LV vol d, MOD A2C: 78.2 ml LV vol d, MOD A4C: 62.2 ml LV vol s, MOD A2C: 40.1 ml LV vol s, MOD A4C: 32.8 ml LV SV MOD A2C:     38.1 ml LV SV MOD A4C:     62.2 ml LV SV MOD BP:      34.6 ml RIGHT VENTRICLE RV S prime:     20.70 cm/s TAPSE  (M-mode): 2.3 cm LEFT ATRIUM             Index       RIGHT ATRIUM           Index LA diam:        2.60 cm 1.07 cm/m  RA Area:     12.00 cm LA Vol (A2C):   39.2 ml 16.08 ml/m RA Volume:   25.90 ml  10.62 ml/m LA Vol (A4C):   18.8 ml 7.71 ml/m LA  Biplane Vol: 27.8 ml 11.40 ml/m   AORTA Ao Root diam: 3.30 cm MITRAL VALVE MV Area (PHT): 3.60 cm    SHUNTS MV Decel Time: 211 msec    Systemic Diam: 2.10 cm MV E velocity: 47.60 cm/s MV A velocity: 66.80 cm/s MV E/A ratio:  0.71 Dorris Carnes MD Electronically signed by Dorris Carnes MD Signature Date/Time: 10/11/2020/5:55:01 PM    Final     Assessment: *Empyema of gallbladder status post cholecystectomy *Bile leak status post ERCP with stent placement *Post ERCP pancreatitis *Postoperative ileus  Plan: On physical exam and interview, patient does appear to be feeling better.  Denies any abdominal pain for me today.  However he continues to have significant output of his JP drain approximately 1000 cc yesterday.  White count also significantly elevated at 19,000.  Discussed case in depth with surgery.  Antibiotics have been escalated due to worsening leukocytosis.  I will discuss case further with Dr. Laural Golden in regards to possible repeat ERCP to evaluate for right sided hepatic bile leak.  GI to continue to follow.  Elon Alas. Abbey Chatters, D.O. Gastroenterology and Hepatology [facility]   LOS: 2 days    10/12/2020, 10:26 AM

## 2020-10-12 NOTE — Progress Notes (Addendum)
[facility] Progress Note  2 Days Post-Op  Subjective: JP output high at 1000 yesterday. Says belly feels better and had BM and gas this AM. Remains distended Xray with bowel with air filled loops, concerned about ileus given the distention.   Leukocytosis up but some could be inflammatory from pancreatitis.  No fevers. UOP good per patient not al recorded. ECHO limited bu EF 75%.    Objective: Vital signs in last 24 hours: Temp:  [97.8 F (36.6 C)-97.9 F (36.6 C)] 97.9 F (36.6 C) (04/09 0628) Pulse Rate:  [94-125] 114 (04/09 1102) Resp:  [18-20] 20 (04/09 0628) BP: (127-175)/(94-121) 149/95 (04/09 1102) SpO2:  [90 %-95 %] 92 % (04/09 1102) Last BM Date: 10/11/20  Intake/Output from previous day: 04/08 0701 - 04/09 0700 In: 3142.1 [I.V.:2109.1; IV Piggyback:863] Out: 6237 [Urine:300; Drains:1005] Intake/Output this shift: Total I/O In: 0  Out: 751 [Urine:350; Drains:400; Stool:1]  General appearance: alert, cooperative and no distress Resp: normal work of breathing GI: soft distended, JP with bile 170 emptied, staples c/d/i without erythema or drainage  Lab Results:  Recent Labs    10/11/20 0419 10/12/20 0408  WBC 15.0* 19.7*  HGB 19.5* 19.9*  HCT 59.0* 57.5*  PLT 258 208   BMET Recent Labs    10/11/20 0419 10/12/20 0408  NA 138 135  K 3.1* 4.3  CL 98 102  CO2 19* 21*  GLUCOSE 230* 139*  BUN 14 28*  CREATININE 1.23 1.39*  CALCIUM 8.8* 7.7*   PT/INR No results for input(s): LABPROT, INR in the last 72 hours.  Studies/Results: DG Abd 1 View  Result Date: 10/11/2020 CLINICAL DATA:  Nasogastric tube placement EXAM: ABDOMEN - 1 VIEW COMPARISON:  Earlier today FINDINGS: Enteric tube with tip over the stomach and side-port over the GE junction. Consider advancement by approximately 6 cm. Right upper quadrant drains and clips. Visualized bowel gas pattern is nonobstructive. IMPRESSION: The enteric tube tip is at the stomach with side port  over the GE junction. Consider advancement by ~6 cm. Electronically Signed   By: Monte Fantasia M.D.   On: 10/11/2020 12:17   DG Abd 1 View  Result Date: 10/11/2020 CLINICAL DATA:  Ileus, abdominal pain. EXAM: ABDOMEN - 1 VIEW COMPARISON:  None. FINDINGS: The bowel gas pattern is normal. No radio-opaque calculi or other significant radiographic abnormality are seen. IMPRESSION: Negative. Electronically Signed   By: Marijo Conception M.D.   On: 10/11/2020 08:52   DG ERCP  Result Date: 10/10/2020 CLINICAL DATA:  High output from surgical drain at gallbladder fossa/subhepatic region suspected bile leak post cholecystectomy EXAM: ERCP with sphincterotomy and stent placement TECHNIQUE: Multiple spot images obtained with the fluoroscopic device and submitted for interpretation post-procedure. FLUOROSCOPY TIME:  Fluoroscopy Time: 2 minutes 21 seconds Radiation Exposure Index (if provided by the fluoroscopic device): 93.677 mGy Number of Acquired Spot Images: Multiple series and images COMPARISON:  MRCP 10/10/2020 FINDINGS: Patent normal caliber CBD. Prominent common hepatic duct without mass or stricture. Contrast extravasation at the surgical clips placed during cholecystectomy most consistent with leak at the cystic duct stump. Cystic duct stump is only faintly visualized. The contrast appears to come from the direction of the hepatic duct confluence and cystic duct origin rather than from the adjacent liver parenchyma as expected from a duct of Luschka, best demonstrated on last cine series. No filling defects to suggest choledocholithiasis. Visualized intrahepatic biliary radicles unremarkable. IMPRESSION: Contrast extravasation at the surgical clips post cholecystectomy consistent with a leak  at the cystic duct stump. Stent was placed with good drainage of bile reported intraoperatively though no images of the stent are submitted. These images were submitted for radiologic interpretation only. Please see the  procedural report for the amount of contrast and the fluoroscopy time utilized. Electronically Signed   By: Lavonia Dana M.D.   On: 10/10/2020 15:55   ECHOCARDIOGRAM COMPLETE  Result Date: 10/11/2020    ECHOCARDIOGRAM REPORT   Patient Name:   JONAVEN HILGERS Date of Exam: 10/11/2020 Medical Rec #:  [id number]      Height:       70.0 in Accession #:    [id number]     Weight:       288.4 lb Date of Birth:  04/28/1971      BSA:          2.439 m Patient Age:    50 years       BP:           175/102 mmHg Patient Gender: M              HR:           95 bpm. Exam Location:  [facility] Procedure: 2D Echo Indications:    Dyspnea R06.00  History:        Patient has prior history of Echocardiogram examinations, most                 recent 05/29/2015. Risk Factors:Current Smoker, Hypertension and                 Dyslipidemia. Schizoaffective disorder , Calculus of                 gallbladder.  Sonographer:    Leavy Cella RDCS (AE) Referring Phys: [phone] [place]  1. Poor acoustic windows limit study.  2. Cannot fully evaluate regional wall motion as endocardium is difficult to see.. Left ventricular ejection fraction, by estimation, is >75%. The left ventricle has hyperdynamic function. There is mild left ventricular hypertrophy. Left ventricular diastolic parameters were normal.  3. Right ventricular systolic function is normal. The right ventricular size is normal.  4. The mitral valve is normal in structure. Trivial mitral valve regurgitation.  5. The aortic valve is abnormal. Aortic valve regurgitation is not visualized. Mild aortic valve sclerosis is present, with no evidence of aortic valve stenosis.  6. The inferior vena cava is normal in size with greater than 50% respiratory variability, suggesting right atrial pressure of 3 mmHg. FINDINGS  Left Ventricle: Cannot fully evaluate regional wall motion as endocardium is difficult to see. Left ventricular ejection fraction, by estimation, is >75%. The  left ventricle has hyperdynamic function. The left ventricular internal cavity size was small.  There is mild left ventricular hypertrophy. Left ventricular diastolic parameters were normal. Right Ventricle: The right ventricular size is normal. Right vetricular wall thickness was not assessed. Right ventricular systolic function is normal. Left Atrium: Left atrial size was normal in size. Right Atrium: Right atrial size was normal in size. Pericardium: Trivial pericardial effusion is present. Mitral Valve: The mitral valve is normal in structure. Trivial mitral valve regurgitation. Tricuspid Valve: The tricuspid valve is grossly normal. Tricuspid valve regurgitation is trivial. Aortic Valve: The aortic valve is abnormal. Aortic valve regurgitation is not visualized. Mild aortic valve sclerosis is present, with no evidence of aortic valve stenosis. Pulmonic Valve: The pulmonic valve was not well visualized. Pulmonic valve regurgitation is not visualized. Aorta: The aortic  root is normal in size and structure. Venous: The inferior vena cava is normal in size with greater than 50% respiratory variability, suggesting right atrial pressure of 3 mmHg. IAS/Shunts: No atrial level shunt detected by color flow Doppler.  LEFT VENTRICLE PLAX 2D LVIDd:         2.33 cm     Diastology LVIDs:         1.85 cm     LV e' medial:    6.74 cm/s LV PW:         1.20 cm     LV E/e' medial:  7.1 LV IVS:        1.21 cm     LV e' lateral:   10.10 cm/s LVOT diam:     2.10 cm     LV E/e' lateral: 4.7 LVOT Area:     3.46 cm  LV Volumes (MOD) LV vol d, MOD A2C: 78.2 ml LV vol d, MOD A4C: 62.2 ml LV vol s, MOD A2C: 40.1 ml LV vol s, MOD A4C: 32.8 ml LV SV MOD A2C:     38.1 ml LV SV MOD A4C:     62.2 ml LV SV MOD BP:      34.6 ml RIGHT VENTRICLE RV S prime:     20.70 cm/s TAPSE (M-mode): 2.3 cm LEFT ATRIUM             Index       RIGHT ATRIUM           Index LA diam:        2.60 cm 1.07 cm/m  RA Area:     12.00 cm LA Vol (A2C):   39.2 ml 16.08  ml/m RA Volume:   25.90 ml  10.62 ml/m LA Vol (A4C):   18.8 ml 7.71 ml/m LA Biplane Vol: 27.8 ml 11.40 ml/m   AORTA Ao Root diam: 3.30 cm MITRAL VALVE MV Area (PHT): 3.60 cm    SHUNTS MV Decel Time: 211 msec    Systemic Diam: 2.10 cm MV E velocity: 47.60 cm/s MV A velocity: 66.80 cm/s MV E/A ratio:  0.71 Dorris Carnes MD Electronically signed by Dorris Carnes MD Signature Date/Time: 10/11/2020/5:55:01 PM    Final     Anti-infectives: Anti-infectives (From admission, onward)   Start     Dose/Rate Route Frequency Ordered Stop   10/12/20 1000  meropenem (MERREM) 1 g in sodium chloride 0.9 % 100 mL IVPB        1 g 200 mL/hr over 30 Minutes Intravenous Every 8 hours 10/12/20 0928 10/17/20 1014   10/09/20 1215  cefTRIAXone (ROCEPHIN) 2 g in sodium chloride 0.9 % 100 mL IVPB  Status:  Discontinued        2 g 200 mL/hr over 30 Minutes Intravenous Every 24 hours 10/09/20 1201 10/12/20 0906   10/09/20 0630  cefoTEtan (CEFOTAN) 2 g in sodium chloride 0.9 % 100 mL IVPB        2 g 200 mL/hr over 30 Minutes Intravenous On call to O.R. 10/09/20 0626 10/09/20 1416   10/09/20 0630  sodium chloride 0.9 % with cefoTEtan (CEFOTAN) ADS Med       Note to Pharmacy: Donnamarie Rossetti   : cabinet override      10/09/20 0630 10/09/20 1416      Assessment/Plan: Mr. Ringel is a 50 yo s/l lap cholecystectomy for empyema of the gallbladder, complicated by cystic duct leak s/p ERCP with stent and sphincterotomy, complicated by pancreatitis post ERCP.  JP drain output is higher than expected. Yesterday I spoke with Dr. Thornton Papas who had Dr. Kathlene Cote look at the images from MRCP and ERCP. No bile leak from the edge of the liver although I know that we had some bile intraoperative from the liver edge. Cystic duct leak on ERCP s/p stent. Dr. Kathlene Cote questioned if had to do something more ERCP with selective contrast in the right hepatic may be option.  PRN for pain IS, OOB Tachycardia, ECHO with EF 75%, monitor, labetalol for BP  elevation  NPO except sips and chips, NG in place for developing ileus Pancreatitis, lipase tomorrow, monitor H&H elevated, concentration likely Leukocytosis up changed to meropenem to be safe, procalcitonin is 5, so some degree of infection but also systemic inflammation too   LR 500cc bolus for tachycardia and 1000 output, LR increased to 150 SCDs, lovenox for prophylaxis Potentially CT a/p tomorrow to look for undrained collections pancreatitis etc   Dr. Abbey Chatters is going to let Dr. Laural Golden know about the JP drainage.    LOS: 2 days    Virl Cagey 10/12/2020

## 2020-10-12 NOTE — Progress Notes (Signed)
Bile drain output 840 today since 8am. Replaced with 1000L Lr bolus now.  Curlene Labrum, MD

## 2020-10-13 ENCOUNTER — Inpatient Hospital Stay (HOSPITAL_COMMUNITY): Payer: Medicaid Other

## 2020-10-13 ENCOUNTER — Encounter (HOSPITAL_COMMUNITY): Payer: Self-pay | Admitting: Radiology

## 2020-10-13 LAB — CBC WITH DIFFERENTIAL/PLATELET
Band Neutrophils: 23 %
Basophils Absolute: 0 10*3/uL (ref 0.0–0.1)
Basophils Relative: 0 %
Eosinophils Absolute: 0.2 10*3/uL (ref 0.0–0.5)
Eosinophils Relative: 1 %
HCT: 50.8 % (ref 39.0–52.0)
Hemoglobin: 16.7 g/dL (ref 13.0–17.0)
Lymphocytes Relative: 6 %
Lymphs Abs: 1.1 10*3/uL (ref 0.7–4.0)
MCH: 30.8 pg (ref 26.0–34.0)
MCHC: 32.9 g/dL (ref 30.0–36.0)
MCV: 93.6 fL (ref 80.0–100.0)
Metamyelocytes Relative: 2 %
Monocytes Absolute: 0.7 10*3/uL (ref 0.1–1.0)
Monocytes Relative: 4 %
Neutro Abs: 15.8 10*3/uL — ABNORMAL HIGH (ref 1.7–7.7)
Neutrophils Relative %: 64 %
Platelets: 187 10*3/uL (ref 150–400)
RBC: 5.43 MIL/uL (ref 4.22–5.81)
RDW: 13.3 % (ref 11.5–15.5)
WBC Morphology: INCREASED
WBC: 18.2 10*3/uL — ABNORMAL HIGH (ref 4.0–10.5)
nRBC: 0 % (ref 0.0–0.2)

## 2020-10-13 LAB — BASIC METABOLIC PANEL
Anion gap: 15 (ref 5–15)
BUN: 31 mg/dL — ABNORMAL HIGH (ref 6–20)
CO2: 20 mmol/L — ABNORMAL LOW (ref 22–32)
Calcium: 7.1 mg/dL — ABNORMAL LOW (ref 8.9–10.3)
Chloride: 100 mmol/L (ref 98–111)
Creatinine, Ser: 1.09 mg/dL (ref 0.61–1.24)
GFR, Estimated: >60 mL/min (ref >60)
Glucose, Bld: 100 mg/dL — ABNORMAL HIGH (ref 70–99)
Potassium: 4.6 mmol/L (ref 3.5–5.1)
Sodium: 135 mmol/L (ref 135–145)

## 2020-10-13 LAB — HEPATIC FUNCTION PANEL
ALT: 25 U/L (ref 0–44)
AST: 33 U/L (ref 15–41)
Albumin: 2.7 g/dL — ABNORMAL LOW (ref 3.5–5.0)
Alkaline Phosphatase: 88 U/L (ref 38–126)
Bilirubin, Direct: 1.4 mg/dL — ABNORMAL HIGH (ref 0.0–0.2)
Indirect Bilirubin: 2.2 mg/dL — ABNORMAL HIGH (ref 0.3–0.9)
Total Bilirubin: 3.6 mg/dL — ABNORMAL HIGH (ref 0.3–1.2)
Total Protein: 5.4 g/dL — ABNORMAL LOW (ref 6.5–8.1)

## 2020-10-13 LAB — LIPASE, BLOOD: Lipase: 139 U/L — ABNORMAL HIGH (ref 11–51)

## 2020-10-13 LAB — PROCALCITONIN: Procalcitonin: 5.18 ng/mL

## 2020-10-13 LAB — AMYLASE, BODY FLUID (OTHER): Amylase, Body Fluid: 3431 U/L

## 2020-10-13 MED ORDER — IOHEXOL 9 MG/ML PO SOLN
ORAL | Status: AC
  Filled 2020-10-13: qty 1000

## 2020-10-13 MED ORDER — ACETAMINOPHEN 160 MG/5ML PO SOLN
1000.0000 mg | Freq: Four times a day (QID) | ORAL | Status: DC
  Administered 2020-10-13 – 2020-10-14 (×4): 1000 mg via ORAL
  Filled 2020-10-13 (×4): qty 40.6

## 2020-10-13 MED ORDER — PHENOL 1.4 % MT LIQD
1.0000 | OROMUCOSAL | Status: DC | PRN
  Administered 2020-10-13: 1 via OROMUCOSAL
  Filled 2020-10-13: qty 177

## 2020-10-13 MED ORDER — IOHEXOL 300 MG/ML  SOLN
100.0000 mL | Freq: Once | INTRAMUSCULAR | Status: AC | PRN
  Administered 2020-10-13: 100 mL via INTRAVENOUS

## 2020-10-13 NOTE — Progress Notes (Signed)
[facility]  CT scan reviewed. Official read not back. Biliary stent in place, significant inflammation around pancreatic head, no major fluid collections, JP in place around fossa. Contrast through duodenum and no signs of extravasation.   Will await official read. Overall reassuring.  Curlene Labrum, MD [facility] [address] [place],  [phone] (office)

## 2020-10-13 NOTE — Progress Notes (Signed)
[facility] Progress Note  3 Days Post-Op  Subjective: JP output clearer and may have slowed down some overnight but still 900 for the day. Fluid sample lipase, amylase, and bilirubin pending. Updated Dr. Laural Golden yesterday and Discussed with Dr. Abbey Chatters.   Ileus continues. Leukocytosis down just slightly, procalcitonin trending down. Lipase down.  Replaced JP drain output yesterday. Will plan to do the same today.   Objective: Vital signs in last 24 hours: Temp:  [98.9 F (37.2 C)-99.8 F (37.7 C)] 99 F (37.2 C) (04/10 0420) Pulse Rate:  [94-126] 99 (04/10 0420) Resp:  [20-22] 22 (04/10 0420) BP: (127-155)/(62-109) 144/86 (04/10 0420) SpO2:  [91 %-95 %] 95 % (04/10 0420) Last BM Date: 10/11/20  Intake/Output from previous day: 04/09 0701 - 04/10 0700 In: 4731.6 [P.O.:180; I.V.:3287.9; IV Piggyback:1263.7] Out: 2316 [Urine:1350; Drains:965; Stool:1] Intake/Output this shift: Total I/O In: 0  Out: 380 [Urine:250; Drains:130]  General appearance: alert, cooperative and mild distress Resp: normal work of breathing GI: soft, distended, tender, JP with clearer bile output  Lab Results:  Recent Labs    10/12/20 0408 10/13/20 0438  WBC 19.7* 18.2*  HGB 19.9* 16.7  HCT 57.5* 50.8  PLT 208 187   BMET Recent Labs    10/12/20 0408 10/13/20 0438  NA 135 135  K 4.3 4.6  CL 102 100  CO2 21* 20*  GLUCOSE 139* 100*  BUN 28* 31*  CREATININE 1.39* 1.09  CALCIUM 7.7* 7.1*   PT/INR No results for input(s): LABPROT, INR in the last 72 hours.  Studies/Results: DG Abd 1 View  Result Date: 10/12/2020 CLINICAL DATA:  Postop day 3 laparoscopic cholecystectomy. Sphincterotomy and biliary stent placement 2 days ago. Generalized abdominal pain. EXAM: ABDOMEN - 1 VIEW COMPARISON:  10/11/2020 and earlier. FINDINGS: Gas within multiple upper normal caliber loops of small bowel throughout the abdomen. Gas within normal caliber colon. Nasogastric tube tip in the fundus of  the stomach. Biliary stent in the RIGHT UPPER QUADRANT. Surgical drain in the RIGHT UPPER QUADRANT. IMPRESSION: Mild generalized ileus. Electronically Signed   By: Evangeline Dakin M.D.   On: 10/12/2020 12:19   DG Abd 1 View  Result Date: 10/11/2020 CLINICAL DATA:  Nasogastric tube placement EXAM: ABDOMEN - 1 VIEW COMPARISON:  Earlier today FINDINGS: Enteric tube with tip over the stomach and side-port over the GE junction. Consider advancement by approximately 6 cm. Right upper quadrant drains and clips. Visualized bowel gas pattern is nonobstructive. IMPRESSION: The enteric tube tip is at the stomach with side port over the GE junction. Consider advancement by ~6 cm. Electronically Signed   By: Monte Fantasia M.D.   On: 10/11/2020 12:17   ECHOCARDIOGRAM COMPLETE  Result Date: 10/11/2020    ECHOCARDIOGRAM REPORT   Patient Name:   Steve Romero Date of Exam: 10/11/2020 Medical Rec #:  [id number]      Height:       70.0 in Accession #:    [id number]     Weight:       288.4 lb Date of Birth:  1971/02/09      BSA:          2.439 m Patient Age:    50 years       BP:           175/102 mmHg Patient Gender: M              HR:           95 bpm. Exam Location:  [facility] Procedure: 2D Echo Indications:    Dyspnea R06.00  History:        Patient has prior history of Echocardiogram examinations, most                 recent 05/29/2015. Risk Factors:Current Smoker, Hypertension and                 Dyslipidemia. Schizoaffective disorder , Calculus of                 gallbladder.  Sonographer:    Leavy Cella RDCS (AE) Referring Phys: [phone] [place]  1. Poor acoustic windows limit study.  2. Cannot fully evaluate regional wall motion as endocardium is difficult to see.. Left ventricular ejection fraction, by estimation, is >75%. The left ventricle has hyperdynamic function. There is mild left ventricular hypertrophy. Left ventricular diastolic parameters were normal.  3. Right ventricular systolic  function is normal. The right ventricular size is normal.  4. The mitral valve is normal in structure. Trivial mitral valve regurgitation.  5. The aortic valve is abnormal. Aortic valve regurgitation is not visualized. Mild aortic valve sclerosis is present, with no evidence of aortic valve stenosis.  6. The inferior vena cava is normal in size with greater than 50% respiratory variability, suggesting right atrial pressure of 3 mmHg. FINDINGS  Left Ventricle: Cannot fully evaluate regional wall motion as endocardium is difficult to see. Left ventricular ejection fraction, by estimation, is >75%. The left ventricle has hyperdynamic function. The left ventricular internal cavity size was small.  There is mild left ventricular hypertrophy. Left ventricular diastolic parameters were normal. Right Ventricle: The right ventricular size is normal. Right vetricular wall thickness was not assessed. Right ventricular systolic function is normal. Left Atrium: Left atrial size was normal in size. Right Atrium: Right atrial size was normal in size. Pericardium: Trivial pericardial effusion is present. Mitral Valve: The mitral valve is normal in structure. Trivial mitral valve regurgitation. Tricuspid Valve: The tricuspid valve is grossly normal. Tricuspid valve regurgitation is trivial. Aortic Valve: The aortic valve is abnormal. Aortic valve regurgitation is not visualized. Mild aortic valve sclerosis is present, with no evidence of aortic valve stenosis. Pulmonic Valve: The pulmonic valve was not well visualized. Pulmonic valve regurgitation is not visualized. Aorta: The aortic root is normal in size and structure. Venous: The inferior vena cava is normal in size with greater than 50% respiratory variability, suggesting right atrial pressure of 3 mmHg. IAS/Shunts: No atrial level shunt detected by color flow Doppler.  LEFT VENTRICLE PLAX 2D LVIDd:         2.33 cm     Diastology LVIDs:         1.85 cm     LV e' medial:    6.74  cm/s LV PW:         1.20 cm     LV E/e' medial:  7.1 LV IVS:        1.21 cm     LV e' lateral:   10.10 cm/s LVOT diam:     2.10 cm     LV E/e' lateral: 4.7 LVOT Area:     3.46 cm  LV Volumes (MOD) LV vol d, MOD A2C: 78.2 ml LV vol d, MOD A4C: 62.2 ml LV vol s, MOD A2C: 40.1 ml LV vol s, MOD A4C: 32.8 ml LV SV MOD A2C:     38.1 ml LV SV MOD A4C:     62.2 ml LV  SV MOD BP:      34.6 ml RIGHT VENTRICLE RV S prime:     20.70 cm/s TAPSE (M-mode): 2.3 cm LEFT ATRIUM             Index       RIGHT ATRIUM           Index LA diam:        2.60 cm 1.07 cm/m  RA Area:     12.00 cm LA Vol (A2C):   39.2 ml 16.08 ml/m RA Volume:   25.90 ml  10.62 ml/m LA Vol (A4C):   18.8 ml 7.71 ml/m LA Biplane Vol: 27.8 ml 11.40 ml/m   AORTA Ao Root diam: 3.30 cm MITRAL VALVE MV Area (PHT): 3.60 cm    SHUNTS MV Decel Time: 211 msec    Systemic Diam: 2.10 cm MV E velocity: 47.60 cm/s MV A velocity: 66.80 cm/s MV E/A ratio:  0.71 Dorris Carnes MD Electronically signed by Dorris Carnes MD Signature Date/Time: 10/11/2020/5:55:01 PM    Final     Anti-infectives: Anti-infectives (From admission, onward)   Start     Dose/Rate Route Frequency Ordered Stop   10/12/20 1000  meropenem (MERREM) 1 g in sodium chloride 0.9 % 100 mL IVPB        1 g 200 mL/hr over 30 Minutes Intravenous Every 8 hours 10/12/20 0928 10/17/20 1014   10/09/20 1215  cefTRIAXone (ROCEPHIN) 2 g in sodium chloride 0.9 % 100 mL IVPB  Status:  Discontinued        2 g 200 mL/hr over 30 Minutes Intravenous Every 24 hours 10/09/20 1201 10/12/20 0906   10/09/20 0630  cefoTEtan (CEFOTAN) 2 g in sodium chloride 0.9 % 100 mL IVPB        2 g 200 mL/hr over 30 Minutes Intravenous On call to O.R. 10/09/20 0626 10/09/20 1416   10/09/20 0630  sodium chloride 0.9 % with cefoTEtan (CEFOTAN) ADS Med       Note to Pharmacy: Donnamarie Rossetti   : cabinet override      10/09/20 0630 10/09/20 1416      Assessment/Plan: Mr. Sitzer is a 50 yo s/l lap cholecystectomy for empyema of the  gallbladder, complicated by cystic duct leak s/p ERCP with stent and sphincterotomy, complicated by pancreatitis post ERCP.   JP drain output coming down. Hopefully things are sealing up. CT a/p ordered to make sure we are not missing another collection or any perforation from instrumentation.   PRN narcotic for pain, scheduled tylenol liquid and toradol scheduled  IS, OOB Tachycardia, improving, labetalol for BP elevation  NPO except sips and chips, NG for ileus Pancreatitis, lipase trending down  H&H coming down/ was a stress response likely, concentration sign with it elevated Leukocytosis down slightly, meropenem for empyema, bile leak, a/w pancreatitis  May need another bolus today with bile drainage, LR @ 150 SCDs, lovenox for prophylaxis CT a/p today  Chloraseptic Ok to ambulate  Likely here through early week. Discussed with patient possibility of another ERCP if output not going down.     LOS: 3 days    Virl Cagey 10/13/2020

## 2020-10-13 NOTE — Progress Notes (Signed)
[facility]  Bowel movement and feeling Better. Bile output down. CT reassuring and read Consistent with my interpretation.  Notified patient. Ng out and sips of juice liquids just no tray today.  Curlene Labrum, MD

## 2020-10-13 NOTE — Progress Notes (Signed)
Subjective:  Patient says he feels much better today.  He rates abdominal pain to be 2 or 3.  Pain is primarily in right upper and right lower quadrant.  He still feels tight in his abdomen.  He did have a bowel movement and he is passing flatus.  He denies shortness of breath.  Current Medications:  Current Facility-Administered Medications:  .  acetaminophen (TYLENOL) 160 MG/5ML solution 1,000 mg, 1,000 mg, Oral, Q6H, Virl Cagey, MD, 1,000 mg at 10/13/20 1404 .  bisacodyl (DULCOLAX) suppository 10 mg, 10 mg, Rectal, Daily PRN, Virl Cagey, MD, 10 mg at 10/10/20 2248 .  diphenhydrAMINE (BENADRYL) 12.5 MG/5ML elixir 12.5 mg, 12.5 mg, Oral, Q6H PRN **OR** diphenhydrAMINE (BENADRYL) injection 12.5 mg, 12.5 mg, Intravenous, Q6H PRN, Khaya Theissen U, MD .  enoxaparin (LOVENOX) injection 40 mg, 40 mg, Subcutaneous, Q24H, Virl Cagey, MD, 40 mg at 10/13/20 1404 .  HYDROmorphone (DILAUDID) injection 1 mg, 1 mg, Intravenous, Q3H PRN, Virl Cagey, MD, 1 mg at 10/13/20 1205 .  ketorolac (TORADOL) 30 MG/ML injection 30 mg, 30 mg, Intravenous, Q6H, Virl Cagey, MD, 30 mg at 10/13/20 0954 .  labetalol (NORMODYNE) injection 10 mg, 10 mg, Intravenous, Q2H PRN, Virl Cagey, MD, 10 mg at 10/12/20 1222 .  lactated ringers infusion, , Intravenous, Continuous, Annitta Needs, NP, Stopped at 10/13/20 1411 .  meropenem (MERREM) 1 g in sodium chloride 0.9 % 100 mL IVPB, 1 g, Intravenous, Q8H, Virl Cagey, MD, Last Rate: 200 mL/hr at 10/13/20 1419, Infusion Verify at 10/13/20 1419 .  ondansetron (ZOFRAN-ODT) disintegrating tablet 4 mg, 4 mg, Oral, Q6H PRN **OR** ondansetron (ZOFRAN) injection 4 mg, 4 mg, Intravenous, Q6H PRN, Promise Weldin U, MD .  oxyCODONE (Oxy IR/ROXICODONE) immediate release tablet 5-10 mg, 5-10 mg, Oral, Q4H PRN, Ocie Tino U, MD, 10 mg at 10/12/20 0041 .  pantoprazole (PROTONIX) injection 40 mg, 40 mg, Intravenous, QHS, Trebor Galdamez U, MD,  40 mg at 10/12/20 2146 .  phenol (CHLORASEPTIC) mouth spray 1 spray, 1 spray, Mouth/Throat, PRN, Virl Cagey, MD, 1 spray at 10/13/20 1412 .  simethicone (MYLICON) chewable tablet 40 mg, 40 mg, Oral, Q6H PRN, Durrel Mcnee U, MD, 40 mg at 10/11/20 0829 .  zolpidem (AMBIEN) tablet 5 mg, 5 mg, Oral, QHS PRN,MR X 1, Bertram Haddix U, MD   Objective: Blood pressure (!) 144/86, pulse 99, temperature 99 F (37.2 C), temperature source Oral, resp. rate (!) 22, height 5' 10"  (1.778 m), weight 130.8 kg, SpO2 95 %. Patient is alert and in no acute distress. NG tube is in place.  Scant amount of fluid in the tube. Conjunctiva is pink. Sclera is nonicteric Oropharyngeal mucosa is normal. No neck masses or thyromegaly noted. Cardiac exam with regular rhythm normal S1 and S2. No murmur or gallop noted. Auscultation of lungs reveal vesicular breath sounds.  Breath sounds are diminished at both bases. Abdomen is distended.  Bowel sounds are normal.  On palpation he has mild to moderate tenderness in right lower quadrant and right upper quadrant.  No tenderness noted in epigastric region or left upper quadrant. No LE edema or clubbing noted.  Biliary drain output 9 6 5  mL last 24 hours; it was 10 05 mL in the preceding 24 hours. 255 mL so far today.  Labs/studies Results:  CBC Latest Ref Rng & Units 10/13/2020 10/12/2020 10/11/2020  WBC 4.0 - 10.5 K/uL 18.2(H) 19.7(H) 15.0(H)  Hemoglobin 13.0 - 17.0 g/dL 16.7 19.9(H)  19.5(H)  Hematocrit 39.0 - 52.0 % 50.8 57.5(H) 59.0(H)  Platelets 150 - 400 K/uL 187 208 258    CMP Latest Ref Rng & Units 10/13/2020 10/12/2020 10/11/2020  Glucose 70 - 99 mg/dL 100(H) 139(H) 230(H)  BUN 6 - 20 mg/dL 31(H) 28(H) 14  Creatinine 0.61 - 1.24 mg/dL 1.09 1.39(H) 1.23  Sodium 135 - 145 mmol/L 135 135 138  Potassium 3.5 - 5.1 mmol/L 4.6 4.3 3.1(L)  Chloride 98 - 111 mmol/L 100 102 98  CO2 22 - 32 mmol/L 20(L) 21(L) 19(L)  Calcium 8.9 - 10.3 mg/dL 7.1(L) 7.7(L) 8.8(L)   Total Protein 6.5 - 8.1 g/dL 5.4(L) 6.2(L) 6.8  Total Bilirubin 0.3 - 1.2 mg/dL 3.6(H) 2.8(H) 0.8  Alkaline Phos 38 - 126 U/L 88 93 110  AST 15 - 41 U/L 33 33 35  ALT 0 - 44 U/L 25 43 65(H)    Hepatic Function Latest Ref Rng & Units 10/13/2020 10/12/2020 10/11/2020  Total Protein 6.5 - 8.1 g/dL 5.4(L) 6.2(L) 6.8  Albumin 3.5 - 5.0 g/dL 2.7(L) 3.2(L) 3.7  AST 15 - 41 U/L 33 33 35  ALT 0 - 44 U/L 25 43 65(H)  Alk Phosphatase 38 - 126 U/L 88 93 110  Total Bilirubin 0.3 - 1.2 mg/dL 3.6(H) 2.8(H) 0.8  Bilirubin, Direct 0.0 - 0.2 mg/dL 1.4(H) 1.4(H) -    Abdominal pelvic CT films reviewed and discussed with Dr. Constance Haw. Severe edema surrounding the pancreas with heterogeneous enhancement of pancreatic head and body and no evidence of pancreatic necrosis. Biliary stent appears to be in good position as is a surgical drain. Consolidation of both lower lobes small left pleural effusion and atelectasis of right middle lobe.  Serum lipase 139  Fluid amylase 3431(range 88-109)  Assessment:  #1.  Post cholecystectomy cystic duct remnant leak.  Patient underwent biliary stenting on 10/10/2020.  Bile output appears to have decreased since this morning.  Continued high output of  fluid due to pancreatitis. Surgical drain in gallbladder fossa and biliary stent appears to be in good position.  #2.  Post ERCP pancreatitis.  This would explain increase in output via surgical drain.  It also had very high amylase level consistent with this diagnosis.  He appears to be improving.  Renal function stable.  #3.  Cholestasis.  Intrahepatic cholestasis is most likely due to acute illness and not indicative of biliary obstruction as indirect bilirubin is higher than direct bilirubin.  Transaminases and alkaline phosphatase are normal.  Recommendations  Agree with removing NG tube as output is 0. For repeat lab in a.m.

## 2020-10-14 LAB — CBC WITH DIFFERENTIAL/PLATELET
Band Neutrophils: 6 %
Basophils Absolute: 0 10*3/uL (ref 0.0–0.1)
Basophils Relative: 0 %
Eosinophils Absolute: 0 10*3/uL (ref 0.0–0.5)
Eosinophils Relative: 0 %
HCT: 44.5 % (ref 39.0–52.0)
Hemoglobin: 15 g/dL (ref 13.0–17.0)
Lymphocytes Relative: 8 %
Lymphs Abs: 1.2 10*3/uL (ref 0.7–4.0)
MCH: 31.1 pg (ref 26.0–34.0)
MCHC: 33.7 g/dL (ref 30.0–36.0)
MCV: 92.1 fL (ref 80.0–100.0)
Metamyelocytes Relative: 1 %
Monocytes Absolute: 0.4 10*3/uL (ref 0.1–1.0)
Monocytes Relative: 3 %
Myelocytes: 1 %
Neutro Abs: 12.6 10*3/uL — ABNORMAL HIGH (ref 1.7–7.7)
Neutrophils Relative %: 81 %
Platelets: 210 10*3/uL (ref 150–400)
RBC: 4.83 MIL/uL (ref 4.22–5.81)
RDW: 13.3 % (ref 11.5–15.5)
WBC: 14.5 10*3/uL — ABNORMAL HIGH (ref 4.0–10.5)
nRBC: 0 % (ref 0.0–0.2)

## 2020-10-14 LAB — HEPATIC FUNCTION PANEL
ALT: 22 U/L (ref 0–44)
AST: 23 U/L (ref 15–41)
Albumin: 2.3 g/dL — ABNORMAL LOW (ref 3.5–5.0)
Alkaline Phosphatase: 90 U/L (ref 38–126)
Bilirubin, Direct: 1.5 mg/dL — ABNORMAL HIGH (ref 0.0–0.2)
Indirect Bilirubin: 1.2 mg/dL — ABNORMAL HIGH (ref 0.3–0.9)
Total Bilirubin: 2.7 mg/dL — ABNORMAL HIGH (ref 0.3–1.2)
Total Protein: 5.6 g/dL — ABNORMAL LOW (ref 6.5–8.1)

## 2020-10-14 LAB — BASIC METABOLIC PANEL
Anion gap: 12 (ref 5–15)
BUN: 26 mg/dL — ABNORMAL HIGH (ref 6–20)
CO2: 25 mmol/L (ref 22–32)
Calcium: 7 mg/dL — ABNORMAL LOW (ref 8.9–10.3)
Chloride: 99 mmol/L (ref 98–111)
Creatinine, Ser: 0.91 mg/dL (ref 0.61–1.24)
GFR, Estimated: >60 mL/min (ref >60)
Glucose, Bld: 119 mg/dL — ABNORMAL HIGH (ref 70–99)
Potassium: 3.7 mmol/L (ref 3.5–5.1)
Sodium: 136 mmol/L (ref 135–145)

## 2020-10-14 LAB — LIPASE, BLOOD: Lipase: 55 U/L — ABNORMAL HIGH (ref 11–51)

## 2020-10-14 LAB — PROCALCITONIN: Procalcitonin: 3.19 ng/mL

## 2020-10-14 MED ORDER — ACETAMINOPHEN 500 MG PO TABS
1000.0000 mg | ORAL_TABLET | Freq: Four times a day (QID) | ORAL | Status: DC
  Administered 2020-10-14 – 2020-10-15 (×4): 1000 mg via ORAL
  Filled 2020-10-14 (×4): qty 2

## 2020-10-14 MED ORDER — DOCUSATE SODIUM 100 MG PO CAPS
100.0000 mg | ORAL_CAPSULE | Freq: Two times a day (BID) | ORAL | Status: DC
  Administered 2020-10-14 – 2020-10-15 (×2): 100 mg via ORAL
  Filled 2020-10-14 (×2): qty 1

## 2020-10-14 MED ORDER — CYCLOBENZAPRINE HCL 10 MG PO TABS
5.0000 mg | ORAL_TABLET | Freq: Three times a day (TID) | ORAL | Status: DC | PRN
  Administered 2020-10-14 (×2): 5 mg via ORAL
  Filled 2020-10-14 (×3): qty 1

## 2020-10-14 MED ORDER — IBUPROFEN 800 MG PO TABS: 800.0000 mg | ORAL_TABLET | Freq: Four times a day (QID) | ORAL | Status: DC | PRN

## 2020-10-14 MED ORDER — ENOXAPARIN SODIUM 80 MG/0.8ML ~~LOC~~ SOLN
65.0000 mg | SUBCUTANEOUS | Status: DC
  Administered 2020-10-14: 65 mg via SUBCUTANEOUS
  Filled 2020-10-14: qty 0.8

## 2020-10-14 NOTE — Progress Notes (Signed)
Subjective: No abdominal pain, N/V. Feeling improved. +flatus. Interested in advancing diet slowly. JP still with bile ouptut but decreasing.   Objective: Vital signs in last 24 hours: Temp:  [98 F (36.7 C)-98.1 F (36.7 C)] 98.1 F (36.7 C) (04/11 0501) Pulse Rate:  [96-107] 96 (04/11 0501) Resp:  [18] 18 (04/11 0501) BP: (156-159)/(94-96) 156/96 (04/11 0501) SpO2:  [90 %-94 %] 92 % (04/11 0501) Last BM Date: 10/13/20 General:   Alert and oriented, pleasant Head:  Normocephalic and atraumatic. Abdomen:  Large AP diameter, distended but soft, mildly TTP at incision sites, stables C/D/I, reticular rash noted right lower abdomen that has been present and biopsied at time of surgery Neurologic:  Alert and  oriented x4 Psych:  Alert and cooperative. Normal mood and affect.  Intake/Output from previous day: 04/10 0701 - 04/11 0700 In: 2205.1 [P.O.:720; I.V.:1315; IV Piggyback:170.1] Out: 1850 [Urine:1150; Drains:700] Intake/Output this shift: No intake/output data recorded.  Lab Results: Recent Labs    10/12/20 0408 10/13/20 0438 10/14/20 0425  WBC 19.7* 18.2* 14.5*  HGB 19.9* 16.7 15.0  HCT 57.5* 50.8 44.5  PLT 208 187 210   BMET Recent Labs    10/12/20 0408 10/13/20 0438 10/14/20 0425  NA 135 135 136  K 4.3 4.6 3.7  CL 102 100 99  CO2 21* 20* 25  GLUCOSE 139* 100* 119*  BUN 28* 31* 26*  CREATININE 1.39* 1.09 0.91  CALCIUM 7.7* 7.1* 7.0*   LFT Recent Labs    10/12/20 0408 10/13/20 0438 10/14/20 0425  PROT 6.2* 5.4* 5.6*  ALBUMIN 3.2* 2.7* 2.3*  AST 33 33 23  ALT 43 25 22  ALKPHOS 93 88 90  BILITOT 2.8* 3.6* 2.7*  BILIDIR 1.4* 1.4* 1.5*  IBILI 1.4* 2.2* 1.2*     Studies/Results: DG Abd 1 View  Result Date: 10/12/2020 CLINICAL DATA:  Postop day 3 laparoscopic cholecystectomy. Sphincterotomy and biliary stent placement 2 days ago. Generalized abdominal pain. EXAM: ABDOMEN - 1 VIEW COMPARISON:  10/11/2020 and earlier. FINDINGS: Gas within  multiple upper normal caliber loops of small bowel throughout the abdomen. Gas within normal caliber colon. Nasogastric tube tip in the fundus of the stomach. Biliary stent in the RIGHT UPPER QUADRANT. Surgical drain in the RIGHT UPPER QUADRANT. IMPRESSION: Mild generalized ileus. Electronically Signed   By: Evangeline Dakin M.D.   On: 10/12/2020 12:19   CT ABDOMEN PELVIS W CONTRAST  Result Date: 10/13/2020 CLINICAL DATA:  50 year old who underwent laparoscopic cholecystectomy for a gallbladder empyema on 10/09/2020 with a postoperative bile leak, treated by endoscopic biliary stent placement on 10/10/2020. The patient subsequently developed pancreatitis with elevated serum amylase and lipase. EXAM: CT ABDOMEN AND PELVIS WITH CONTRAST TECHNIQUE: Multidetector CT imaging of the abdomen and pelvis was performed using the standard protocol following bolus administration of intravenous contrast. CONTRAST:  143mL OMNIPAQUE IOHEXOL 300 MG/ML IV. COMPARISON:  Preoperative CT abdomen and pelvis 09/17/2020. FINDINGS: Lower chest: Consolidation in the lower lobes, LEFT greater than RIGHT with an associated small LEFT pleural effusion. Linear atelectasis in the lingula and RIGHT MIDDLE LOBE. Normal heart size. Hepatobiliary: Liver normal in size and appearance. Surgically absent gallbladder with a surgical drain in the gallbladder fossa endoscopic stent appropriately positioned. No evidence of biliary ductal dilation. Pancreas: Severe edema/inflammation surrounding the pancreas, with heterogeneous enhancement of the pancreatic head and body, though no findings to confirm pancreatic necrosis at this time. The edema/inflammation extends into the anterior pararenal spaces bilaterally, into the retroperitoneum of the pelvis  bilaterally, into the root of the mesentry and into the transverse mesocolon. Spleen: Normal in size and appearance. Adrenals/Urinary Tract: Normal appearing adrenal glands. Geographic low attenuation  involving the lower poles of both kidneys, most conspicuous on the delayed images. No urinary tract calculi on either side. No evidence of hydronephrosis. Normal appearing decompressed urinary bladder. Prominent urachal remnant at the anterior dome of the bladder as noted previously. Stomach/Bowel: Nasogastric tube in the fundus the stomach. Mild thickening of the wall of the gastric antrum and pylorus, secondary inflammation from the pancreatitis. Similar secondary inflammation of the duodenal bulb and descending duodenum. Remainder of the small bowel normal in appearance. Entire colon decompressed. Mild thickening of the wall of the transverse colon, secondary inflammation from the pancreatitis. Appendix not conspicuous. Vascular/Lymphatic: No visible aorto-iliofemoral atherosclerosis. Normal-appearing portal venous and systemic venous systems. Specifically, the splenic vein and the portal vein are currently patent. No pathologic lymphadenopathy. Reproductive: Prostate gland and seminal vesicles normal in size and appearance for age. Other: Edema involving the anterior abdominal wall. Postsurgical changes related to the recent laparoscopic procedure in the anterior abdominal wall. Musculoskeletal: Severe degenerative disc disease and associated endplate sclerosis at I2-0. 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 No acute findings. IMPRESSION: 1. Severe acute pancreatitis. No findings to confirm pancreatic necrosis at this time. 2. Secondary inflammation of the gastric antrum and fundus, the duodenal bulb, descending duodenum, and transverse colon. 3. Focal pyelonephritis involving the lower poles of both kidneys is suspected. 4. Consolidation involving the lower lobes, LEFT greater than RIGHT, with an associated small LEFT pleural effusion. Atelectasis is favored over pneumonia, as there is linear atelectasis in the RIGHT MIDDLE LOBE and lingula. Electronically Signed   By: Evangeline Dakin  M.D.   On: 10/13/2020 13:56    Assessment: 50 year old male s/p cholecystectomy with cystic duct remnant leak, s/p ERCP with biliary stenting on 10/10/20. Post-ERCP pancreatitis with slowly improving symptoms and lipase 55 today.   JP drain in place with output bilious but seems to be decreasing. Likely pancreatitis contributing to persistent output. Fluid lipase still pending.   Post-ERCP pancreatitis: lipase improved, clinically improving. Appropriate from GI standpoint for full liquids.   Elevated total bilirubin: with elevated direct and indirect. Doubt obstruction and likely due to acute illness.    Plan: Follow HFP Agree with full liquids Continue supportive measures Will continue to follow with you  Annitta Needs, PhD, ANP-BC [facility]     LOS: 4 days    10/14/2020, 9:47 AM

## 2020-10-14 NOTE — Plan of Care (Signed)

## 2020-10-14 NOTE — Progress Notes (Signed)
[facility] Progress Note  4 Days Post-Op  Subjective: JP continues to have bilious output but decreasing it seems. Says pain is better and had Bms yesterday and wanting to eat. CT reassuring yesterday without collections, known pancreatitis and stent in place. Lipase down.   Objective: Vital signs in last 24 hours: Temp:  [98 F (36.7 C)-98.1 F (36.7 C)] 98.1 F (36.7 C) (04/11 0501) Pulse Rate:  [96-107] 96 (04/11 0501) Resp:  [18] 18 (04/11 0501) BP: (156-159)/(94-96) 156/96 (04/11 0501) SpO2:  [90 %-94 %] 92 % (04/11 0501) Last BM Date: 10/13/20  Intake/Output from previous day: 04/10 0701 - 04/11 0700 In: 2205.1 [P.O.:720; I.V.:1315; IV Piggyback:170.1] Out: 1850 [Urine:1150; Drains:700] Intake/Output this shift: No intake/output data recorded.  General appearance: alert, cooperative and no distress Resp: normal work of breathing GI: soft, distended, appropriately tender, bile in drain, staples c/d/i without erythema or drainage  Lab Results:  Recent Labs    10/13/20 0438 10/14/20 0425  WBC 18.2* 14.5*  HGB 16.7 15.0  HCT 50.8 44.5  PLT 187 210   BMET Recent Labs    10/13/20 0438 10/14/20 0425  NA 135 136  K 4.6 3.7  CL 100 99  CO2 20* 25  GLUCOSE 100* 119*  BUN 31* 26*  CREATININE 1.09 0.91  CALCIUM 7.1* 7.0*   PT/INR No results for input(s): LABPROT, INR in the last 72 hours.  Studies/Results: CT ABDOMEN PELVIS W CONTRAST  Result Date: 10/13/2020 CLINICAL DATA:  50 year old who underwent laparoscopic cholecystectomy for a gallbladder empyema on 10/09/2020 with a postoperative bile leak, treated by endoscopic biliary stent placement on 10/10/2020. The patient subsequently developed pancreatitis with elevated serum amylase and lipase. EXAM: CT ABDOMEN AND PELVIS WITH CONTRAST TECHNIQUE: Multidetector CT imaging of the abdomen and pelvis was performed using the standard protocol following bolus administration of intravenous contrast.  CONTRAST:  124mL OMNIPAQUE IOHEXOL 300 MG/ML IV. COMPARISON:  Preoperative CT abdomen and pelvis 09/17/2020. FINDINGS: Lower chest: Consolidation in the lower lobes, LEFT greater than RIGHT with an associated small LEFT pleural effusion. Linear atelectasis in the lingula and RIGHT MIDDLE LOBE. Normal heart size. Hepatobiliary: Liver normal in size and appearance. Surgically absent gallbladder with a surgical drain in the gallbladder fossa endoscopic stent appropriately positioned. No evidence of biliary ductal dilation. Pancreas: Severe edema/inflammation surrounding the pancreas, with heterogeneous enhancement of the pancreatic head and body, though no findings to confirm pancreatic necrosis at this time. The edema/inflammation extends into the anterior pararenal spaces bilaterally, into the retroperitoneum of the pelvis bilaterally, into the root of the mesentry and into the transverse mesocolon. Spleen: Normal in size and appearance. Adrenals/Urinary Tract: Normal appearing adrenal glands. Geographic low attenuation involving the lower poles of both kidneys, most conspicuous on the delayed images. No urinary tract calculi on either side. No evidence of hydronephrosis. Normal appearing decompressed urinary bladder. Prominent urachal remnant at the anterior dome of the bladder as noted previously. Stomach/Bowel: Nasogastric tube in the fundus the stomach. Mild thickening of the wall of the gastric antrum and pylorus, secondary inflammation from the pancreatitis. Similar secondary inflammation of the duodenal bulb and descending duodenum. Remainder of the small bowel normal in appearance. Entire colon decompressed. Mild thickening of the wall of the transverse colon, secondary inflammation from the pancreatitis. Appendix not conspicuous. Vascular/Lymphatic: No visible aorto-iliofemoral atherosclerosis. Normal-appearing portal venous and systemic venous systems. Specifically, the splenic vein and the portal vein are  currently patent. No pathologic lymphadenopathy. Reproductive: Prostate gland and seminal vesicles normal  in size and appearance for age. Other: Edema involving the anterior abdominal wall. Postsurgical changes related to the recent laparoscopic procedure in the anterior abdominal wall. Musculoskeletal: Severe degenerative disc disease and associated endplate sclerosis at Z6-1. 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 No acute findings. IMPRESSION: 1. Severe acute pancreatitis. No findings to confirm pancreatic necrosis at this time. 2. Secondary inflammation of the gastric antrum and fundus, the duodenal bulb, descending duodenum, and transverse colon. 3. Focal pyelonephritis involving the lower poles of both kidneys is suspected. 4. Consolidation involving the lower lobes, LEFT greater than RIGHT, with an associated small LEFT pleural effusion. Atelectasis is favored over pneumonia, as there is linear atelectasis in the RIGHT MIDDLE LOBE and lingula. Electronically Signed   By: Evangeline Dakin M.D.   On: 10/13/2020 13:56    Anti-infectives: Anti-infectives (From admission, onward)   Start     Dose/Rate Route Frequency Ordered Stop   10/12/20 1000  meropenem (MERREM) 1 g in sodium chloride 0.9 % 100 mL IVPB        1 g 200 mL/hr over 30 Minutes Intravenous Every 8 hours 10/12/20 0928 10/17/20 1014   10/09/20 1215  cefTRIAXone (ROCEPHIN) 2 g in sodium chloride 0.9 % 100 mL IVPB  Status:  Discontinued        2 g 200 mL/hr over 30 Minutes Intravenous Every 24 hours 10/09/20 1201 10/12/20 0906   10/09/20 0630  cefoTEtan (CEFOTAN) 2 g in sodium chloride 0.9 % 100 mL IVPB        2 g 200 mL/hr over 30 Minutes Intravenous On call to O.R. 10/09/20 0626 10/09/20 1416   10/09/20 0630  sodium chloride 0.9 % with cefoTEtan (CEFOTAN) ADS Med       Note to Pharmacy: Donnamarie Rossetti   : cabinet override      10/09/20 0630 10/09/20 1416      Assessment/Plan: Mr. Vetrano is a 50 yo s/l  lap cholecystectomy for empyema of the gallbladder, complicated by cystic duct leak s/p ERCP with stent and sphincterotomy, complicated by pancreatitis post ERCP.  JP drain output seems to be decreasing still. CT reassuring.   PRN narcotic, po tylenol scheduled and stopped toradol IS, OOB Tachycardia improved, labetalol for BP control, improving Full liquid Pancreatitis improving, lipase down Fluid samples pending for bilirubin and lipase, amylase was 3431 indicating mixed mucosal tissue, potentially some of the fluid is from pancreatitis reaction H&H stable Leukocytosis down, will continue meropenum one more day at least Procalcitonin down SCDs, lovenox Derm pathology pending on the skin/ fat lesion  Flexeril for muscle spasm     LOS: 4 days    Virl Cagey 10/14/2020

## 2020-10-15 ENCOUNTER — Telehealth: Payer: Self-pay | Admitting: Gastroenterology

## 2020-10-15 LAB — CBC WITH DIFFERENTIAL/PLATELET
Abs Immature Granulocytes: 0.3 10*3/uL — ABNORMAL HIGH (ref 0.00–0.07)
Basophils Absolute: 0.1 10*3/uL (ref 0.0–0.1)
Basophils Relative: 0 %
Eosinophils Absolute: 0.1 10*3/uL (ref 0.0–0.5)
Eosinophils Relative: 0 %
HCT: 42.5 % (ref 39.0–52.0)
Hemoglobin: 14.1 g/dL (ref 13.0–17.0)
Immature Granulocytes: 2 %
Lymphocytes Relative: 10 %
Lymphs Abs: 1.5 10*3/uL (ref 0.7–4.0)
MCH: 30.4 pg (ref 26.0–34.0)
MCHC: 33.2 g/dL (ref 30.0–36.0)
MCV: 91.6 fL (ref 80.0–100.0)
Monocytes Absolute: 1 10*3/uL (ref 0.1–1.0)
Monocytes Relative: 7 %
Neutro Abs: 12.4 10*3/uL — ABNORMAL HIGH (ref 1.7–7.7)
Neutrophils Relative %: 81 %
Platelets: 243 10*3/uL (ref 150–400)
RBC: 4.64 MIL/uL (ref 4.22–5.81)
RDW: 13.2 % (ref 11.5–15.5)
WBC: 15.3 10*3/uL — ABNORMAL HIGH (ref 4.0–10.5)
nRBC: 0 % (ref 0.0–0.2)

## 2020-10-15 LAB — COMPREHENSIVE METABOLIC PANEL
ALT: 27 U/L (ref 0–44)
AST: 37 U/L (ref 15–41)
Albumin: 2.3 g/dL — ABNORMAL LOW (ref 3.5–5.0)
Alkaline Phosphatase: 105 U/L (ref 38–126)
Anion gap: 12 (ref 5–15)
BUN: 16 mg/dL (ref 6–20)
CO2: 25 mmol/L (ref 22–32)
Calcium: 6.8 mg/dL — ABNORMAL LOW (ref 8.9–10.3)
Chloride: 100 mmol/L (ref 98–111)
Creatinine, Ser: 0.84 mg/dL (ref 0.61–1.24)
GFR, Estimated: >60 mL/min (ref >60)
Glucose, Bld: 144 mg/dL — ABNORMAL HIGH (ref 70–99)
Potassium: 3.4 mmol/L — ABNORMAL LOW (ref 3.5–5.1)
Sodium: 137 mmol/L (ref 135–145)
Total Bilirubin: 1.7 mg/dL — ABNORMAL HIGH (ref 0.3–1.2)
Total Protein: 5.4 g/dL — ABNORMAL LOW (ref 6.5–8.1)

## 2020-10-15 LAB — LIPASE, BLOOD: Lipase: 32 U/L (ref 11–51)

## 2020-10-15 MED ORDER — ONDANSETRON 4 MG PO TBDP: 4.0000 mg | ORAL_TABLET | Freq: Three times a day (TID) | ORAL | 1 refills | Status: DC | PRN

## 2020-10-15 MED ORDER — OXYCODONE HCL 5 MG PO TABS: 5.0000 mg | ORAL_TABLET | ORAL | 0 refills | Status: DC | PRN

## 2020-10-15 MED ORDER — CYCLOBENZAPRINE HCL 10 MG PO TABS: 5.0000 mg | ORAL_TABLET | Freq: Three times a day (TID) | ORAL | 1 refills | Status: DC | PRN

## 2020-10-15 MED ORDER — DOCUSATE SODIUM 100 MG PO CAPS: 100.0000 mg | ORAL_CAPSULE | Freq: Two times a day (BID) | ORAL | 1 refills | Status: DC | PRN

## 2020-10-15 MED ORDER — AMOXICILLIN-POT CLAVULANATE 875-125 MG PO TABS: 1.0000 | ORAL_TABLET | Freq: Two times a day (BID) | ORAL | 0 refills | Status: AC

## 2020-10-15 NOTE — Discharge Instructions (Signed)
Discharge Laparoscopic Surgery Instructions:  Common Complaints: Right shoulder pain is common after laparoscopic surgery. This is secondary to the gas used in the surgery being trapped under the diaphragm.  Walk to help your body absorb the gas. This will improve in a few days. Pain at the port sites are common, especially the larger port sites. This will improve with time.  Some nausea is common and poor appetite. The main goal is to stay hydrated the first few days after surgery.   Diet/ Activity: Diet as tolerated. You may not have an appetite, but it is important to stay hydrated. Drink 64 ounces of water a day. Your appetite will return with time.  Shower per your regular routine daily. Cover your JP drain during showers with plastic taped off.  Do not take hot showers. Take warm showers that are less than 10 minutes. Rest and listen to your body, but do not remain in bed all day.  Walk everyday for at least 15-20 minutes. Deep cough and move around every 1-2 hours in the first few days after surgery.  Do not lift > 10 lbs, perform excessive bending, pushing, pulling, squatting for 1-2 weeks after surgery.  Do no pick at your staples.   Do not place lotions or balms on your incision unless instructed to specifically by Dr. Constance Haw.   Pain Expectations and Narcotics: -After surgery you will have pain associated with your incisions and this is normal. The pain is muscular and nerve pain, and will get better with time. -You are encouraged and expected to take non narcotic medications like tylenol and ibuprofen (when able) to treat pain as multiple modalities can aid with pain treatment. -Narcotics are only used when pain is severe or there is breakthrough pain. -You are not expected to have a pain score of 0 after surgery, as we cannot prevent pain. A pain score of 3-4 that allows you to be functional, move, walk, and tolerate some activity is the goal. The pain will continue to improve over  the days after surgery and is dependent on your surgery. -Due to [place] law, we are only able to give a certain amount of pain medication to treat post operative pain, and we only give additional narcotics on a patient by patient basis.  -For most laparoscopic surgery, studies have shown that the majority of patients only need 10-15 narcotic pills, and for open surgeries most patients only need 15-20.   -Having appropriate expectations of pain and knowledge of pain management with non narcotics is important as we do not want anyone to become addicted to narcotic pain medication.  -Using ice packs in the first 48 hours and heating pads after 48 hours, wearing an abdominal binder (when recommended), and using over the counter medications are all ways to help with pain management.   -Simple acts like meditation and mindfulness practices after surgery can also help with pain control and research has proven the benefit of these practices.  Medication: Take tylenol and ibuprofen as needed for pain control, alternating every 4-6 hours.  Example:  Tylenol 1000mg  @ 6am, 12noon, 6pm, 36midnight (Do not exceed 4000mg  of tylenol a day). Ibuprofen 800mg  @ 9am, 3pm, 9pm, 3am (Do not exceed 3600mg  of ibuprofen a day).  Take Roxicodone for breakthrough pain every 4 hours.  Take Colace for constipation related to narcotic pain medication. If you do not have a bowel movement in 2 days, take Miralax over the counter.  Drink plenty of water to also prevent  constipation.   Contact Information: If you have questions or concerns, please call our office, [phone], Monday- Thursday 8AM-5PM and Friday 8AM-12Noon.  If it is after hours or on the weekend, please call [facility], [phone], [phone], and ask to speak to the surgeon on call for Dr. Constance Haw at [facility]   JP Drain Please keep the drain clean and dry. You can place gauze around the drain if needed for leakage around drain.  Please do not mess  with or cut the stitch that is keeping the drain in place. Secure the drain to your clothes so that it does not get dislodged.  You may want to wear a binder or ACE wrap around your abdomen (girdle) at night for sleeping if you are worried about it getting pulled out.  Please record the output from the drain daily including the color and the amount in milliliters.  Strip the tubing of the drain with your fingers so it does not get clogged.  Please keep the drain covered with plastic and tape when you shower so that it does not get wet.    Minimally Invasive Cholecystectomy, Care After This sheet gives you information about how to care for yourself after your procedure. Your doctor may also give you more specific instructions. If you have problems or questions, contact your doctor. What can I expect after the procedure? After the procedure, it is common:  To have pain at the areas of surgery. You will be given medicines for pain.  To vomit or feel like you may vomit.  To feel fullness in the belly (bloating) or to have pain in the shoulder. This comes from the gas that was used during the surgery. Follow these instructions at home: Medicines  Take over-the-counter and prescription medicines only as told by your doctor.  If you were prescribed an antibiotic medicine, take it as told by your doctor. Do not stop using the antibiotic even if you start to feel better.  Ask your doctor if the medicine prescribed to you: ? Requires you to avoid driving or using machinery. ? Can cause trouble pooping (constipation). You may need to take these actions to prevent or treat trouble pooping:  Drink enough fluid to keep your pee (urine) pale yellow.  Take over-the-counter or prescription medicines.  Eat foods that are high in fiber. These include beans, whole grains, and fresh fruits and vegetables.  Limit foods that are high in fat and sugar. These include fried or sweet foods. Incision  care  Follow instructions from your doctor about how to take care of your cuts from surgery (incisions). Make sure you: ? Wash your hands with soap and water for at least 20 seconds before and after you change your bandage (dressing). If you cannot use soap and water, use hand sanitizer. ? Change your bandage as told by your doctor. ? Leave stitches (sutures), skin glue, or skin tape (adhesive) strips in place. They may need to stay in place for 2 weeks or longer. If tape strips get loose and curl up, you may trim the loose edges. Do not remove tape strips completely unless your doctor says it is okay.  Do not take baths, swim, or use a hot tub until your doctor approves. Ask your doctor if you may take showers. You may only be allowed to take sponge baths.  Check your surgery area every day for signs of infection. Check for: ? More redness, swelling, or pain. ? Fluid or blood. ?  Warmth. ? Pus or a bad smell.   Activity  Rest as told by your doctor.  Do not sit for a long time without moving. Get up to take short walks every 1-2 hours. This is important. Ask for help if you feel weak or unsteady.  Do not lift anything that is heavier than 10 lb (4.5 kg), or the limit that you are told, until your doctor says that it is safe.  Do not play contact sports until your doctor says it is okay.  Do not return to work or school until your doctor says it is okay.  Return to your normal activities as told by your doctor. Ask your doctor what activities are safe for you. General instructions  If you were given a medicine to help you relax (sedative) during your procedure, it can affect you for many hours. Do not drive or use machinery until your doctor says that it is safe.  Keep all follow-up visits as told by your doctor. This is important. Contact a doctor if:  You get a rash.  You have more redness, swelling, or pain around your cuts from surgery.  You have fluid or blood coming from  your cuts from surgery.  Your cuts from surgery feel warm to the touch.  You have pus or a bad smell coming from your cuts from surgery.  You have a fever.  One or more of your cuts from surgery breaks open. Get help right away if:  You have trouble breathing.  You have chest pain.  You have pain that is getting worse in your shoulders.  You faint or feel dizzy when you stand.  You have very bad pain in your belly (abdomen).  You feel like you may vomit or you vomit, and this lasts for more than one day.  You have leg pain. Summary  After your surgery, it is common to have pain at the areas of surgery. You may also have vomiting or fullness in the belly.  Follow your doctor's instructions about medicine, activity restrictions, and caring for your surgery areas. Do not do activities that require a lot of effort.  Contact a doctor if you have a fever or other signs of infection, such as more redness, swelling, or pain around the cuts from surgery.  Get help right away if you have chest pain, increasing pain in the shoulders, or trouble breathing. This information is not intended to replace advice given to you by your health care provider. Make sure you discuss any questions you have with your health care provider. Document Revised: 06/06/2019 Document Reviewed: 06/06/2019 Elsevier Patient Education  2021 [employer].

## 2020-10-15 NOTE — Discharge Summary (Signed)
Physician Discharge Summary  Patient ID: Steve Romero MRN: [id number] DOB/AGE: 12/14/1970 50 y.o.  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 Steve Romero is a very sweet 50 yo who has suffered from RUQ pain and symptoms of gallstones. He has been seen in the ED and was referred to my office. He also had a reticular skin lesion on his abdomen that had been there for about 4 months. We brought him in for cholecystectomy and skin lesion biopsy.  His gallbladder showed signs of cholecystitis and empyema with purulent drainage and fell apart during his procedure.  Due to this the cystic duct was not completely clipped.  His gallbladder was also intrahepatic and some bile was noted leaking from the lateral fossa edge.  A JP drain was left.  Post operative an MRCP was done confirming the cystic duct leak and confirming no other obvious sources of leak given potential for aberrant anatomy.  ERCP was done and the cystic duct leak was covered with a stent. He developed post ERCP pancreatitis. His biliary drain did drain a significant amount post operatively and I discussed with Dr. Laural Romero. It was likely that this was a mix of bile and edema./ fluid from his pancreatitis.    His drain output was sampled but was not resulted prior to discharge.    He was maintained on antibiotics post op due to the empyema and bile leak. Post operatively he was started on a diet after a brief ileus with NG, this was advanced and he was having BMs. He was educated on his JP drain care and measuring the drainage and recording. He had pain control with oral pain medications.  He was sent home to complete a course of antibiotics for post op given his infection and elevated procalcitonin post op.   He did require some labetalol for elevated BP post operatively but this improved prior to his discharge.  His skin/fat biopsy pathology came back as lipoma but I asked pathology to refer this to derm-path for further review given the odd appearance of the reticular lesion on his abdomen.   Consults: GI and Dr.  Laural Romero- ERCP  Significant Diagnostic Studies:  CLINICAL DATA:  50 year old who underwent laparoscopic cholecystectomy for a gallbladder empyema on 10/09/2020 with a postoperative bile leak, treated by endoscopic biliary stent placement on 10/10/2020. The patient subsequently developed pancreatitis with elevated serum amylase and lipase.  EXAM: CT ABDOMEN AND PELVIS WITH CONTRAST  TECHNIQUE: Multidetector CT imaging of the abdomen and pelvis was performed using the standard protocol following bolus administration of intravenous contrast.  CONTRAST:  148mL OMNIPAQUE IOHEXOL 300 MG/ML IV.  COMPARISON:  Preoperative CT abdomen and pelvis 09/17/2020.  FINDINGS: Lower chest: Consolidation in the lower lobes, LEFT greater than RIGHT with an associated small LEFT pleural effusion. Linear atelectasis in the lingula and RIGHT MIDDLE LOBE. Normal heart size.  Hepatobiliary: Liver normal in size and appearance. Surgically absent gallbladder with a surgical drain in the gallbladder fossa endoscopic stent appropriately positioned. No evidence of biliary ductal dilation.  Pancreas: Severe edema/inflammation surrounding the pancreas, with heterogeneous enhancement of the pancreatic head and body, though no findings to confirm pancreatic necrosis at this time. The  edema/inflammation extends into the anterior pararenal spaces bilaterally, into the retroperitoneum of the pelvis bilaterally, into the root of the mesentry and into the transverse  mesocolon.  Spleen: Normal in size and appearance.  Adrenals/Urinary Tract: Normal appearing adrenal glands. Geographic low attenuation involving the lower poles of both kidneys, most conspicuous on the delayed images. No urinary tract calculi on either side. No evidence of hydronephrosis. Normal appearing decompressed urinary bladder. Prominent urachal remnant at the anterior dome of the bladder as noted previously.  Stomach/Bowel: Nasogastric tube in the fundus the stomach. Mild thickening of the wall of the gastric antrum and pylorus, secondary inflammation from the pancreatitis. Similar secondary inflammation of the duodenal bulb and descending duodenum. Remainder of the small bowel normal in appearance. Entire colon decompressed. Mild thickening of the wall of the transverse colon, secondary inflammation from the pancreatitis. Appendix not conspicuous.  Vascular/Lymphatic: No visible aorto-iliofemoral atherosclerosis. Normal-appearing portal venous and systemic venous systems. Specifically, the splenic vein and the portal vein are currently patent.  No pathologic lymphadenopathy.  Reproductive: Prostate gland and seminal vesicles normal in size and appearance for age.  Other: Edema involving the anterior abdominal wall. Postsurgical changes related to the recent laparoscopic procedure in the anterior abdominal wall.  Musculoskeletal: Severe degenerative disc disease and associated endplate sclerosis at M3-5. 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 No acute findings.  IMPRESSION: 1. Severe acute pancreatitis. No findings to confirm pancreatic necrosis at this time. 2. Secondary inflammation of the gastric antrum and fundus, the duodenal bulb, descending duodenum, and transverse colon. 3. Focal pyelonephritis involving the lower poles of both kidneys is suspected. 4. Consolidation involving the lower lobes, LEFT greater than  RIGHT, with an associated small LEFT pleural effusion. Atelectasis is favored over pneumonia, as there is linear atelectasis in the RIGHT MIDDLE LOBE and lingula.   Electronically Signed   By: Evangeline Dakin M.D.   On: 10/13/2020 13:56  CLINICAL DATA:  History of cholecystitis with gallbladder empyema. Clinical concern for aberrant right hepatic duct system.  EXAM: MRI ABDOMEN WITHOUT AND WITH CONTRAST (INCLUDING MRCP)  TECHNIQUE: Multiplanar multisequence MR imaging of the abdomen was performed both before and after the administration of intravenous contrast. Heavily T2-weighted images of the biliary and pancreatic ducts were obtained, and three-dimensional MRCP images were rendered by post processing.  CONTRAST:  21mL GADAVIST GADOBUTROL 1 MMOL/ML IV SOLN  COMPARISON:  CT scan 09/17/2020  FINDINGS: Lower chest: Subsegmental atelectasis noted in the dependent lung bases.  Hepatobiliary: Evaluation of liver is motion degraded, notably on the MRCP and postcontrast imaging. Within this limitation, no gross intrahepatic mass lesion evident. Heterogeneous signal intensity in the gallbladder fossa with some enhancement, compatible with the history of cholecystectomy yesterday. No substantial perihepatic fluid collection although there is some trace fluid along the inferior tip of the right liver. No intra or extrahepatic biliary duct dilatation. Although assessment of MRCP imaging is markedly degraded by motion artifact, no aberrant right hepatic duct anatomy is discernible.  Pancreas: No focal mass lesion. No dilatation of the main duct. No intraparenchymal cyst. No peripancreatic edema.  Spleen:  No splenomegaly. No focal mass lesion.  Adrenals/Urinary Tract: No adrenal nodule or mass. Kidneys unremarkable.  Stomach/Bowel: Stomach is unremarkable. No gastric wall thickening. No evidence of outlet obstruction. Duodenum is normally positioned as is the  ligament of Treitz. No small bowel or colonic dilatation within the visualized abdomen.  Vascular/Lymphatic: No abdominal aortic aneurysm. No abdominal lymphadenopathy.  Other:  No substantial intraperitoneal free  fluid.  Musculoskeletal: No focal suspicious marrow enhancement within the visualized bony anatomy.  IMPRESSION: 1. Status post cholecystectomy. Heterogeneous signal intensity in the gallbladder fossa likely reflects trace fluid/blood products from surgery, not unexpected. No gross fluid collection in the gallbladder fossa or along the liver capsule. 2. Trace fluid/edema seen along the inferior tip of the right liver. 3. Motion degraded MRCP imaging shows no intra or extrahepatic biliary duct dilatation. No choledocholithiasis. Hepatic duct confluence is somewhat patulous, but no discernible aberrant biliary duct anatomy. 4. Subsegmental atelectasis in the dependent lung bases.   Electronically Signed   By: Misty Stanley M.D.   On: 10/10/2020 12:24   CLINICAL DATA:  High output from surgical drain at gallbladder fossa/subhepatic region suspected bile leak post cholecystectomy  EXAM: ERCP with sphincterotomy and stent placement  TECHNIQUE: Multiple spot images obtained with the fluoroscopic device and submitted for interpretation post-procedure.  FLUOROSCOPY TIME:  Fluoroscopy Time: 2 minutes 21 seconds  Radiation Exposure Index (if provided by the fluoroscopic device): 93.677 mGy  Number of Acquired Spot Images: Multiple series and images  COMPARISON:  MRCP 10/10/2020  FINDINGS: Patent normal caliber CBD.  Prominent common hepatic duct without mass or stricture.  Contrast extravasation at the surgical clips placed during cholecystectomy most consistent with leak at the cystic duct stump.  Cystic duct stump is only faintly visualized.  The contrast appears to come from the direction of the hepatic duct confluence and cystic duct  origin rather than from the adjacent liver parenchyma as expected from a duct of Luschka, best demonstrated on last cine series.  No filling defects to suggest choledocholithiasis.  Visualized intrahepatic biliary radicles unremarkable.  IMPRESSION: Contrast extravasation at the surgical clips post cholecystectomy consistent with a leak at the cystic duct stump.  Stent was placed with good drainage of bile reported intraoperatively though no images of the stent are submitted.  These images were submitted for radiologic interpretation only. Please see the procedural report for the amount of contrast and the fluoroscopy time utilized.   Electronically Signed   By: Lavonia Dana M.D.   On: 10/10/2020 15:55  Pathology: FINAL MICROSCOPIC DIAGNOSIS:   A. GALLBLADDER, CHOLECYSTECTOMY:  - Chronic cholecystitis and cholelithiasis   B. SKIN, ABDOMINAL WALL, EXCISION:  - Skin with underlying benign lipoma  Treatments: IV hydration, antibiotics: ceftriaxone and Meropenem, analgesia: acetaminophen, Dilaudid and Roxicodone and Laparoscopic cholecystectomy, skin /fat biopsy, ERCP with stent placement   Discharge Exam: Blood pressure (!) 152/94, pulse 98, temperature (!) 97 F (36.1 C), resp. rate (!) 22, height 5\' 10"  (1.778 m), weight 130.8 kg, SpO2 94 %. General appearance: alert, cooperative and no distress Resp: normal work of breathing GI: soft, distended mildly, tender, JP with bile output, drain stripped, Staples c/d/i without erythema or drainage  Disposition: Discharge disposition: 01-Home or Self Care       Discharge Instructions    Call MD for:  difficulty breathing, headache or visual disturbances   Complete by: As directed    Call MD for:  extreme fatigue   Complete by: As directed    Call MD for:  hives   Complete by: As directed    Call MD for:  persistant dizziness or light-headedness   Complete by: As directed    Call MD for:  persistant nausea and  vomiting   Complete by: As directed    Call MD for:  redness, tenderness, or signs of infection (pain, swelling, redness, odor or green/yellow discharge around incision site)  Complete by: As directed    Call MD for:  severe uncontrolled pain   Complete by: As directed    Call MD for:  temperature >100.4   Complete by: As directed    Increase activity slowly   Complete by: As directed      Allergies as of 10/15/2020      Reactions   Desipramine Nausea Only, Rash      Medication List    TAKE these medications   amoxicillin-clavulanate 875-125 MG tablet Commonly known as: Augmentin Take 1 tablet by mouth every 12 (twelve) hours for 5 days.   cyclobenzaprine 10 MG tablet Commonly known as: FLEXERIL Take 0.5 tablets (5 mg total) by mouth 3 (three) times daily as needed for muscle spasms.   docusate sodium 100 MG capsule Commonly known as: COLACE Take 1 capsule (100 mg total) by mouth 2 (two) times daily as needed for mild constipation (while taking narcotics).   ondansetron 4 MG disintegrating tablet Commonly known as: Zofran ODT Take 1 tablet (4 mg total) by mouth every 8 (eight) hours as needed for nausea or vomiting.   oxyCODONE 5 MG immediate release tablet Commonly known as: Oxy IR/ROXICODONE Take 1-2 tablets (5-10 mg total) by mouth every 4 (four) hours as needed for severe pain or breakthrough pain.   risperiDONE 1 MG tablet Commonly known as: RISPERDAL Take 1 mg by mouth at bedtime.   senna-docusate 8.6-50 MG tablet Commonly known as: Senokot-S Take 1 tablet by mouth at bedtime as needed for mild constipation or moderate constipation.   traZODone 150 MG tablet Commonly known as: DESYREL Take 300 mg by mouth at bedtime.       Follow-up Information    Virl Cagey, MD Follow up on 10/22/2020.   Specialty: General Surgery Why: drain check, and staple removal Contact information: [address] Linna Hoff [facility] [address] [phone]                Signed: Virl Cagey 10/16/2020, 2:19 PM

## 2020-10-15 NOTE — Progress Notes (Signed)
Pharmacy Antibiotic Note  Steve Romero is a 50 y.o. male admitted on 10/09/2020 with intra-abdominal infection.  Pharmacy has been consulted for Merrem dosing. Empyema of the gallbladder, complicated by pancreatitis post ERCP.   Plan: Continue Merrem 1gm IV q8h for total of 5 days F/U cxs and clinical progress Monitor V/S, labs  Height: 5\' 10"  (177.8 cm) Weight: 130.8 kg (288 lb 5.8 oz) IBW/kg (Calculated) : 73  Temp (24hrs), Avg:97.2 F (36.2 C), Min:97 F (36.1 C), Max:97.4 F (36.3 C)  Recent Labs  Lab 10/11/20 0419 10/12/20 0408 10/13/20 0438 10/14/20 0425 10/15/20 0541  WBC 15.0* 19.7* 18.2* 14.5* 15.3*  CREATININE 1.23 1.39* 1.09 0.91 0.84    Estimated Creatinine Clearance: 144.6 mL/min (by C-G formula based on SCr of 0.84 mg/dL).    Allergies  Allergen Reactions  . Desipramine Nausea Only and Rash    Antimicrobials this admission: merrem 4/9>>  Ceftriaxone 4/6 >> 4/9  Microbiology results: No cultures  Thank you for allowing pharmacy to be a part of this patient's care.  Margot Ables, PharmD Clinical Pharmacist 10/15/2020 10:38 AM

## 2020-10-15 NOTE — Telephone Encounter (Signed)
Patient needs hospital follow-up with Dr. Laural Golden in 2 weeks. Dx: Post ERCP pancreatitis, bile leak s/p plastic stent placement.

## 2020-10-15 NOTE — Progress Notes (Signed)
Briefly saw patient as he was walking down the hallway leaving the hospital. He had already been discharged. Stated he was feeling well. No abdominal pain, nausea, or vomiting. Tolerating soft diet well. States he was given instruction on how to care for the JP drain and has follow-up with Dr. Constance Haw on Tuesday.   Advised that we would get him set up for follow-up with Dr. Laural Golden.   Aliene Altes, PA-C [facility]

## 2020-10-15 NOTE — Progress Notes (Signed)
Patient demonstrated knowledge of stripping JP drain.  Knows to record output. Discharge instructions reviewed and scripts sent to pharmacy.

## 2020-10-15 NOTE — Telephone Encounter (Signed)
Forwarded to [employer] to schedule

## 2020-10-17 LAB — SURGICAL PATHOLOGY

## 2020-10-21 LAB — LIPASE, FLUID: Lipase-Fluid: 9576 U/L

## 2020-10-22 ENCOUNTER — Other Ambulatory Visit: Payer: Self-pay

## 2020-10-22 ENCOUNTER — Ambulatory Visit (INDEPENDENT_AMBULATORY_CARE_PROVIDER_SITE_OTHER): Payer: Medicaid Other | Admitting: General Surgery

## 2020-10-22 ENCOUNTER — Encounter: Payer: Self-pay | Admitting: General Surgery

## 2020-10-22 VITALS — BP 126/67 | HR 97 | Temp 98.4°F | Resp 18 | Ht 69.5 in | Wt 276.0 lb

## 2020-10-22 DIAGNOSIS — K859 Acute pancreatitis without necrosis or infection, unspecified: Secondary | ICD-10-CM

## 2020-10-22 DIAGNOSIS — K9189 Other postprocedural complications and disorders of digestive system: Secondary | ICD-10-CM

## 2020-10-22 DIAGNOSIS — K8020 Calculus of gallbladder without cholecystitis without obstruction: Secondary | ICD-10-CM

## 2020-10-22 MED ORDER — OXYCODONE HCL 5 MG PO TABS: 5.0000 mg | ORAL_TABLET | ORAL | 0 refills | Status: DC | PRN

## 2020-10-22 NOTE — Patient Instructions (Signed)
Continue to record the drain output The steristrips will pull up after about 5-7 days, ok to shower. Keep drain covered. Follow up with me or Dr. Laural Golden next week.'  Go get labs at [facility] across from hospital today.  Will call with results.

## 2020-10-22 NOTE — Progress Notes (Signed)
[facility]   HPI:  50 y.o. Male presents to clinic for post-op follow-up evaluation after laparoscopic cholecystectomy and skin biopsy. He developed a cystic duct bile leak and had a ERCP with stent complicated by pancreatitis. He is better now. His drain has been putting out less ranging from 100-175 in the last 3 days.  Review of Systems:  No fevers Improving pain Eating Having Bms All other review of systems: otherwise negative   Vital Signs:  BP 126/67   Pulse 97   Temp 98.4 F (36.9 C) (Other (Comment))   Resp 18   Ht 5' 9.5" (1.765 m)   Wt 276 lb (125.2 kg)   SpO2 95%   BMI 40.17 kg/m    Physical Exam:  Physical Exam Vitals reviewed.  Cardiovascular:     Rate and Rhythm: Normal rate.  Pulmonary:     Effort: Pulmonary effort is normal.  Abdominal:     General: There is no distension.     Palpations: Abdomen is soft.     Tenderness: There is abdominal tenderness.     Comments: JP drain with serous fluid, some suds, staples c/d/i with minor serous fluid from umbilical incision, no erythema except at the staple entry sites, no signs of cellulitis, staples removed and steri strips placed, drain sponge replaced  Neurological:     Mental Status: He is alert.    Pathology: FINAL MICROSCOPIC DIAGNOSIS:   A. GALLBLADDER, CHOLECYSTECTOMY:  - Chronic cholecystitis and cholelithiasis   B. SKIN, ABDOMINAL WALL, EXCISION:  - Skin with underlying benign lipoma   ADDENDUM:   B.  Rest of the skin from part B was submitted and reviewed with Dr.  Andria Frames in dermatopathology. No additional histopathologic changes were  identified.    Assessment:  50 y.o. yo Male s/p lap cholecystectomy and skin biopsy for empyema of the gallbladder and abdominal skin lesion.   Plan:  - Pathology reviewed skin lesion /reticular lesion again and no pathology identified/ just lipoma   - JP drain in place for now - Repeat labs now to see how things are trending -  Will see next week and decide about drain, may need imaging before removal to ensure no leakage will discuss with Dr. Laural Golden based on next weeks output  - Roxicodone refilled   Future Appointments  Date Time Provider [place]  10/31/2020  8:30 AM [name], DO [facility] None  10/31/2020  2:45 PM Virl Cagey, MD [facility] None     Curlene Labrum, MD [facility] [address] [place], [place] [address] [phone] (office)

## 2020-10-23 LAB — COMPREHENSIVE METABOLIC PANEL
AG Ratio: 0.8 (calc) — ABNORMAL LOW (ref 1.0–2.5)
ALT: 22 U/L (ref 9–46)
AST: 29 U/L (ref 10–40)
Albumin: 2.6 g/dL — ABNORMAL LOW (ref 3.6–5.1)
Alkaline phosphatase (APISO): 232 U/L — ABNORMAL HIGH (ref 36–130)
BUN: 8 mg/dL (ref 7–25)
CO2: 31 mmol/L (ref 20–32)
Calcium: 8.1 mg/dL — ABNORMAL LOW (ref 8.6–10.3)
Chloride: 98 mmol/L (ref 98–110)
Creat: 0.95 mg/dL (ref 0.60–1.35)
Globulin: 3.1 g/dL (calc) (ref 1.9–3.7)
Glucose, Bld: 101 mg/dL (ref 65–139)
Potassium: 5.1 mmol/L (ref 3.5–5.3)
Sodium: 139 mmol/L (ref 135–146)
Total Bilirubin: 0.5 mg/dL (ref 0.2–1.2)
Total Protein: 5.7 g/dL — ABNORMAL LOW (ref 6.1–8.1)

## 2020-10-23 LAB — CBC WITH DIFFERENTIAL/PLATELET
Absolute Monocytes: 730 cells/uL (ref 200–950)
Basophils Absolute: 76 cells/uL (ref 0–200)
Basophils Relative: 0.5 %
Eosinophils Absolute: 182 cells/uL (ref 15–500)
Eosinophils Relative: 1.2 %
HCT: 41.3 % (ref 38.5–50.0)
Hemoglobin: 13.6 g/dL (ref 13.2–17.1)
Lymphs Abs: 1961 cells/uL (ref 850–3900)
MCH: 29.8 pg (ref 27.0–33.0)
MCHC: 32.9 g/dL (ref 32.0–36.0)
MCV: 90.6 fL (ref 80.0–100.0)
MPV: 9.2 fL (ref 7.5–12.5)
Monocytes Relative: 4.8 %
Neutro Abs: 12251 cells/uL — ABNORMAL HIGH (ref 1500–7800)
Neutrophils Relative %: 80.6 %
Platelets: 468 10*3/uL — ABNORMAL HIGH (ref 140–400)
RBC: 4.56 10*6/uL (ref 4.20–5.80)
RDW: 12.6 % (ref 11.0–15.0)
Total Lymphocyte: 12.9 %
WBC: 15.2 10*3/uL — ABNORMAL HIGH (ref 3.8–10.8)

## 2020-10-23 LAB — LIPASE: Lipase: 32 U/L (ref 7–60)

## 2020-10-28 LAB — TOTAL BILIRUBIN, BODY FLUID: Total bilirubin, fluid: 27.1 mg/dL

## 2020-10-31 ENCOUNTER — Encounter: Payer: Self-pay | Admitting: Family Medicine

## 2020-10-31 ENCOUNTER — Encounter: Payer: Self-pay | Admitting: General Surgery

## 2020-10-31 ENCOUNTER — Other Ambulatory Visit: Payer: Self-pay

## 2020-10-31 ENCOUNTER — Other Ambulatory Visit (HOSPITAL_COMMUNITY)
Admission: RE | Admit: 2020-10-31 | Discharge: 2020-10-31 | Disposition: A | Discharge status: Home / Self Care | Payer: Medicaid Other | Source: Ambulatory Visit | Attending: General Surgery | Admitting: General Surgery

## 2020-10-31 ENCOUNTER — Ambulatory Visit (INDEPENDENT_AMBULATORY_CARE_PROVIDER_SITE_OTHER): Payer: Medicaid Other | Admitting: Family Medicine

## 2020-10-31 ENCOUNTER — Ambulatory Visit (INDEPENDENT_AMBULATORY_CARE_PROVIDER_SITE_OTHER): Payer: Medicaid Other | Admitting: General Surgery

## 2020-10-31 VITALS — BP 138/79 | HR 95 | Temp 97.3°F | Ht 69.5 in | Wt 255.8 lb

## 2020-10-31 VITALS — BP 140/81 | HR 101 | Temp 98.1°F | Resp 20 | Ht 69.5 in | Wt 255.0 lb

## 2020-10-31 DIAGNOSIS — K9189 Other postprocedural complications and disorders of digestive system: Secondary | ICD-10-CM

## 2020-10-31 DIAGNOSIS — Z1159 Encounter for screening for other viral diseases: Secondary | ICD-10-CM | POA: Diagnosis not present

## 2020-10-31 DIAGNOSIS — F259 Schizoaffective disorder, unspecified: Secondary | ICD-10-CM

## 2020-10-31 DIAGNOSIS — K859 Acute pancreatitis without necrosis or infection, unspecified: Secondary | ICD-10-CM

## 2020-10-31 DIAGNOSIS — Z9049 Acquired absence of other specified parts of digestive tract: Secondary | ICD-10-CM | POA: Diagnosis not present

## 2020-10-31 DIAGNOSIS — K810 Acute cholecystitis: Secondary | ICD-10-CM

## 2020-10-31 DIAGNOSIS — D72829 Elevated white blood cell count, unspecified: Secondary | ICD-10-CM

## 2020-10-31 LAB — COMPREHENSIVE METABOLIC PANEL
ALT: 530 U/L — ABNORMAL HIGH (ref 0–44)
AST: 661 U/L — ABNORMAL HIGH (ref 15–41)
Albumin: 2.7 g/dL — ABNORMAL LOW (ref 3.5–5.0)
Alkaline Phosphatase: 976 U/L — ABNORMAL HIGH (ref 38–126)
Anion gap: 12 (ref 5–15)
BUN: 15 mg/dL (ref 6–20)
CO2: 26 mmol/L (ref 22–32)
Calcium: 8.8 mg/dL — ABNORMAL LOW (ref 8.9–10.3)
Chloride: 99 mmol/L (ref 98–111)
Creatinine, Ser: 0.85 mg/dL (ref 0.61–1.24)
GFR, Estimated: >60 mL/min (ref >60)
Glucose, Bld: 136 mg/dL — ABNORMAL HIGH (ref 70–99)
Potassium: 4.1 mmol/L (ref 3.5–5.1)
Sodium: 137 mmol/L (ref 135–145)
Total Bilirubin: 3.9 mg/dL — ABNORMAL HIGH (ref 0.3–1.2)
Total Protein: 7 g/dL (ref 6.5–8.1)

## 2020-10-31 LAB — CBC WITH DIFFERENTIAL/PLATELET
Basophils Absolute: 0 10*3/uL (ref 0.0–0.2)
Basos: 0 %
EOS (ABSOLUTE): 0.1 10*3/uL (ref 0.0–0.4)
Eos: 0 %
Hematocrit: 43.8 % (ref 37.5–51.0)
Hemoglobin: 14.6 g/dL (ref 13.0–17.7)
Lymphocytes Absolute: 1.6 10*3/uL (ref 0.7–3.1)
Lymphs: 9 %
MCH: 29.9 pg (ref 26.6–33.0)
MCHC: 33.3 g/dL (ref 31.5–35.7)
MCV: 90 fL (ref 79–97)
Monocytes Absolute: 1 10*3/uL — ABNORMAL HIGH (ref 0.1–0.9)
Monocytes: 6 %
Neutrophils Absolute: 15.8 10*3/uL — ABNORMAL HIGH (ref 1.4–7.0)
Neutrophils: 85 %
Platelets: 465 10*3/uL — ABNORMAL HIGH (ref 150–450)
RBC: 4.89 x10E6/uL (ref 4.14–5.80)
RDW: 13.9 % (ref 11.6–15.4)
WBC: 18.5 10*3/uL — ABNORMAL HIGH (ref 3.4–10.8)

## 2020-10-31 LAB — LIPASE, BLOOD: Lipase: 97 U/L — ABNORMAL HIGH (ref 11–51)

## 2020-10-31 MED ORDER — TRAZODONE HCL 150 MG PO TABS: 150.0000 mg | ORAL_TABLET | Freq: Every day | ORAL | 0 refills | Status: DC

## 2020-10-31 MED ORDER — AMOXICILLIN-POT CLAVULANATE 875-125 MG PO TABS: 1.0000 | ORAL_TABLET | Freq: Two times a day (BID) | ORAL | 0 refills | Status: DC

## 2020-10-31 MED ORDER — OXYCODONE HCL 5 MG PO TABS: 5.0000 mg | ORAL_TABLET | ORAL | 0 refills | Status: DC | PRN

## 2020-10-31 MED ORDER — RISPERIDONE 1 MG PO TABS: 1.0000 mg | ORAL_TABLET | Freq: Every day | ORAL | 0 refills | Status: DC

## 2020-10-31 NOTE — Progress Notes (Signed)
[facility] Note   HPI:  49 y.o. Male presents to clinic for post-op follow-up evaluation after lap chole with bile leak and stent with ERCP. Feeling ok but not much of an appetite drain output has been better with yellow serous drainage and documenting 75-125 cc output daily.   Saw Dr.Gotchhalk earlier today and leukocytosis is up. No fevers or signs of infection.   Review of Systems:  No fevers  All other review of systems: otherwise negative   Vital Signs:  BP 140/81   Pulse (!) 101   Temp 98.1 F (36.7 C) (Other (Comment))   Resp 20   Ht 5' 9.5" (1.765 m)   Wt 255 lb (115.7 kg)   SpO2 92%   BMI 37.12 kg/m    Physical Exam:  Physical Exam Vitals reviewed.  Cardiovascular:     Rate and Rhythm: Normal rate.  Pulmonary:     Effort: Pulmonary effort is normal.  Abdominal:     General: There is distension.     Palpations: Abdomen is soft.     Comments: Mildly tender, JP with serous output, minimal suds, port incisions c/d/i and the biopsy site has minor scab, neosporin bandage placed, no erythema or drainage  Musculoskeletal:        General: Normal range of motion.  Skin:    General: Skin is warm.  Neurological:     Mental Status: He is alert.    Results for ZAYQUAN, BOGARD (MRN [id number]) as of 11/01/2020 09:33  Ref. Range 10/31/2020 08:58  WBC Latest Ref Range: 3.4 - 10.8 x10E3/uL 18.5 (H)  RBC Latest Ref Range: 4.14 - 5.80 x10E6/uL 4.89  Hemoglobin Latest Ref Range: 13.0 - 17.7 g/dL 14.6  HCT Latest Ref Range: 37.5 - 51.0 % 43.8  MCV Latest Ref Range: 79 - 97 fL 90  MCH Latest Ref Range: 26.6 - 33.0 pg 29.9  MCHC Latest Ref Range: 31.5 - 35.7 g/dL 33.3  RDW Latest Ref Range: 11.6 - 15.4 % 13.9  Platelets Latest Ref Range: 150 - 450 x10E3/uL 465 (H)  Neutrophils Latest Ref Range: Not Estab. % 85  NEUT# Latest Ref Range: 1.4 - 7.0 x10E3/uL 15.8 (H)  Lymphocyte # Latest Ref Range: 0.7 - 3.1 x10E3/uL 1.6  Monocytes Absolute Latest Ref Range: 0.1 -  0.9 x10E3/uL 1.0 (H)  Basophils Absolute Latest Ref Range: 0.0 - 0.2 x10E3/uL 0.0  Lymphs Latest Ref Range: Not Estab. % 9  Monocytes Latest Ref Range: Not Estab. % 6  Basos Latest Ref Range: Not Estab. % 0  Eos Latest Ref Range: Not Estab. % 0  EOS (ABSOLUTE) Latest Ref Range: 0.0 - 0.4 x10E3/uL 0.1    Assessment:  50 y.o. yo Male with biliary stent in place after lap chole with a leak.  Leukocytosis up but no fevers. Doing fair and staying hydrated but just not feeling great.   Plan:  CMP ordered and Lipase  Will order a CT scan today and will call you with the results. Get Augmentin from [place] and take given the leukocytosis (infection count).  This could be just from inflammation but could be from some infection. Will get CT to sort out.   Pain medication refill sent in.   Continue to drink and stay hydrated. Get some Boost/ ensure to help with nutrition when you appetite is not the best. Call with changes or any signs of fever or infection. Keep spot on the abdomen covered with a bandaid. Continue drain care for now.  Will get lab work before I see you next week in [place], [place], [place], DO know.   Future Appointments  Date Time Provider [place]  11/01/2020  1:00 PM [facility] [place] [facility]  11/07/2020  2:45 PM Virl Cagey, MD [facility] None  01/31/2021  8:00 AM [name], DO [facility] None     Curlene Labrum, MD [facility] [address] [place], [place] [address] [phone] (office)

## 2020-10-31 NOTE — Patient Instructions (Addendum)
Will order a CT scan today and will call you with the results. Get Augmentin from [place] and take given the leukocytosis (infection count).  This could be just from inflammation but could be from some infection. Will get CT to sort out.   Pain medication refill sent in.   Continue to drink and stay hydrated. Get some Boost/ ensure to help with nutrition when you appetite is not the best. Call with changes or any signs of fever or infection. Keep spot on the abdomen covered with a bandaid. Continue drain care for now.  Will get lab work before I see you next week.

## 2020-10-31 NOTE — Progress Notes (Signed)
Subjective: CC: Checkup PCP: [name], DO ATF:TDDUKG Steve Romero is a 50 y.o. male presenting to clinic today for:  1.  Status post laparoscopic cholecystectomy Patient had a laparoscopic cholecystectomy.  He notes his gallbladder "disintegrated" because it was so infected.  He currently has a drain in and has a checkup this afternoon with his surgeon.  He apparently also required a stent that was placed by Dr. Laural Golden.  He does note decreased appetite and easy nausea but is taking his medications as directed to aid with this.  Bowel movements have been small but he admits that he is not eating much.  He is trying to maintain fluids and liquid diet at this time.  No vomiting.  No fevers.  Urine output is normal.  Does not report any hematochezia or melena  2.  Schizoaffective disorder Patient is treated with trazodone 150 mg twice daily or 300 mg at bedtime and Risperdal 1 mg nightly.  He has been off of these medicines for the last couple of months because he was unable to follow-up with his psychiatrist.  He would like to get back on them.  Does not report any suicidal ideation.   ROS: Per HPI  Allergies  Allergen Reactions  . Desipramine Nausea Only and Rash   Past Medical History:  Diagnosis Date  . Anxiety 10-2014  . Bipolar disorder ([place]) age 42  . COPD (chronic obstructive pulmonary disease) ([place]) 2016  . Depression age 61  . Enlarged prostate   . Hyperlipidemia 2016  . Hypertension 2013  . Schizoaffective disorder ([place]) 11-13-14  . Sleep apnea   . Thyroid disease 11-2014    Current Outpatient Medications:  .  cyclobenzaprine (FLEXERIL) 10 MG tablet, Take 0.5 tablets (5 mg total) by mouth 3 (three) times daily as needed for muscle spasms., Disp: 30 tablet, Rfl: 1 .  docusate sodium (COLACE) 100 MG capsule, Take 1 capsule (100 mg total) by mouth 2 (two) times daily as needed for mild constipation (while taking narcotics)., Disp: 60 capsule, Rfl: 1 .  ondansetron  (ZOFRAN ODT) 4 MG disintegrating tablet, Take 1 tablet (4 mg total) by mouth every 8 (eight) hours as needed for nausea or vomiting., Disp: 30 tablet, Rfl: 1 .  oxyCODONE (OXY IR/ROXICODONE) 5 MG immediate release tablet, Take 1-2 tablets (5-10 mg total) by mouth every 4 (four) hours as needed for severe pain or breakthrough pain., Disp: 30 tablet, Rfl: 0 .  risperiDONE (RISPERDAL) 1 MG tablet, Take 1 mg by mouth at bedtime., Disp: , Rfl:  .  senna-docusate (SENOKOT-S) 8.6-50 MG tablet, Take 1 tablet by mouth at bedtime as needed for mild constipation or moderate constipation., Disp: 20 tablet, Rfl: 0 .  traZODone (DESYREL) 150 MG tablet, Take 300 mg by mouth at bedtime., Disp: , Rfl:  Social History   Socioeconomic History  . Marital status: Single    Spouse name: Not on file  . Number of children: Not on file  . Years of education: Not on file  . Highest education level: Not on file  Occupational History  . Not on file  Tobacco Use  . Smoking status: Current Every Day Smoker    Packs/day: 0.25    Years: 26.00    Pack years: 6.50    Types: Cigarettes  . Smokeless tobacco: Never Used  . Tobacco comment: 4-5 cig daily as of 10/07/2020.  Vaping Use  . Vaping Use: Never used  Substance and Sexual Activity  . Alcohol use: No  .  Drug use: No  . Sexual activity: Never    Birth control/protection: None  Other Topics Concern  . Not on file  Social History Narrative  . Not on file   Social Determinants of Health   Financial Resource Strain: Not on file  Food Insecurity: Not on file  Transportation Needs: Not on file  Physical Activity: Not on file  Stress: Not on file  Social Connections: Not on file  Intimate Partner Violence: Not on file   Family History  Problem Relation Age of Onset  . Diabetes Father   . Heart disease Father   . Heart disease Maternal Grandmother   . Stroke Maternal Grandfather     Objective: Office vital signs reviewed. BP 138/79   Pulse 95   Temp  (!) 97.3 F (36.3 C)   Ht 5' 9.5" (1.765 m)   Wt 255 lb 12.8 oz (116 kg)   SpO2 96%   BMI 37.23 kg/m   Physical Examination:  General: Awake, alert, appears uncomfortable and leans to left side, No acute distress HEENT: Normal; sclera white Cardio: regular rate and rhythm, S1S2 heard, no murmurs appreciated Pulm: clear to auscultation bilaterally, no wheezes, rhonchi or rales; normal work of breathing on room air GI: Protuberant.  He has a drain in his abdomen that is draining yellow, clear fluid.  There is a white cast like substance noted in the drain.  No blood or overt purulence noted Psych: Flat affect but mood stable.  Patient is pleasant and interactive.  Does not appear to be responding to internal stimuli  Depression screen [facility] 2/9 10/31/2020 09/24/2020 04/18/2020  Decreased Interest 0 0 0  Down, Depressed, Hopeless 0 0 0  PHQ - 2 Score 0 0 0  Altered sleeping - - -  Tired, decreased energy - - -  Change in appetite - - -  Feeling bad or failure about yourself  - - -  Trouble concentrating - - -  Moving slowly or fidgety/restless - - -  Suicidal thoughts - - -  PHQ-9 Score - - -   GAD 7 : Generalized Anxiety Score 10/04/2019  Nervous, Anxious, on Edge 1  Control/stop worrying 0  Worry too much - different things 0  Trouble relaxing 0  Restless 0  Easily annoyed or irritable 0  Afraid - awful might happen 0  Total GAD 7 Score 1  Anxiety Difficulty Not difficult at all   Assessment/ Plan: 50 y.o. male   S/P laparoscopic cholecystectomy - Plan: CBC with Differential  Leukocytosis, unspecified type - Plan: CBC with Differential  Need for hepatitis C screening test - Plan: Hepatitis C antibody  Schizoaffective disorder, unspecified type ([place]) - Plan: risperiDONE (RISPERDAL) 1 MG tablet, traZODone (DESYREL) 150 MG tablet  Drain with serous fluid.  There was what appears to be a white cast in the drain but overall there is no significant purulence to the fluid.  He  is afebrile and does not demonstrate any systemic signs of infection or complication.  I discussed continuing liquid diet as able.  Would focus on protein so as to maximize healing process.  Follow-up with general surgery as scheduled.  We will arrange for colonoscopy with Dr. Laural Golden at some point in the future but would certainly suggest that he heal from his current surgery.  Medications have been renewed but I have advised that he follow-up with his psychiatrist as directed.  71-month bridge supply provided.  No orders of the defined types were placed in  this encounter.  No orders of the defined types were placed in this encounter.    [name], DO [place] [phone]

## 2020-11-01 ENCOUNTER — Other Ambulatory Visit: Payer: Self-pay

## 2020-11-01 ENCOUNTER — Telehealth (INDEPENDENT_AMBULATORY_CARE_PROVIDER_SITE_OTHER): Payer: Medicaid Other | Admitting: General Surgery

## 2020-11-01 ENCOUNTER — Inpatient Hospital Stay: Admission: AD | Admit: 2020-11-01 | Payer: Medicaid Other | Source: Ambulatory Visit | Admitting: General Surgery

## 2020-11-01 ENCOUNTER — Inpatient Hospital Stay (HOSPITAL_COMMUNITY)
Admission: RE | Admit: 2020-11-01 | Discharge: 2020-11-24 | DRG: 405 | Disposition: A | Discharge status: Home Health | Payer: Medicaid Other | Source: Ambulatory Visit | Attending: Internal Medicine | Admitting: Internal Medicine

## 2020-11-01 ENCOUNTER — Encounter (HOSPITAL_COMMUNITY): Payer: Self-pay

## 2020-11-01 VITALS — BP 129/75 | HR 100 | Temp 99.7°F | Resp 18 | Ht 69.5 in | Wt 262.3 lb

## 2020-11-01 DIAGNOSIS — R7989 Other specified abnormal findings of blood chemistry: Secondary | ICD-10-CM | POA: Diagnosis present

## 2020-11-01 DIAGNOSIS — T859XXD Unspecified complication of internal prosthetic device, implant and graft, subsequent encounter: Secondary | ICD-10-CM | POA: Diagnosis not present

## 2020-11-01 DIAGNOSIS — Z20822 Contact with and (suspected) exposure to covid-19: Secondary | ICD-10-CM | POA: Diagnosis present

## 2020-11-01 DIAGNOSIS — E039 Hypothyroidism, unspecified: Secondary | ICD-10-CM | POA: Diagnosis present

## 2020-11-01 DIAGNOSIS — T859XXA Unspecified complication of internal prosthetic device, implant and graft, initial encounter: Secondary | ICD-10-CM

## 2020-11-01 DIAGNOSIS — J9811 Atelectasis: Secondary | ICD-10-CM | POA: Diagnosis present

## 2020-11-01 DIAGNOSIS — W57XXXA Bitten or stung by nonvenomous insect and other nonvenomous arthropods, initial encounter: Secondary | ICD-10-CM | POA: Diagnosis present

## 2020-11-01 DIAGNOSIS — K862 Cyst of pancreas: Secondary | ICD-10-CM | POA: Diagnosis present

## 2020-11-01 DIAGNOSIS — E785 Hyperlipidemia, unspecified: Secondary | ICD-10-CM | POA: Diagnosis present

## 2020-11-01 DIAGNOSIS — E669 Obesity, unspecified: Secondary | ICD-10-CM | POA: Diagnosis present

## 2020-11-01 DIAGNOSIS — Z79899 Other long term (current) drug therapy: Secondary | ICD-10-CM

## 2020-11-01 DIAGNOSIS — I10 Essential (primary) hypertension: Secondary | ICD-10-CM | POA: Diagnosis present

## 2020-11-01 DIAGNOSIS — K8309 Other cholangitis: Secondary | ICD-10-CM

## 2020-11-01 DIAGNOSIS — R509 Fever, unspecified: Secondary | ICD-10-CM

## 2020-11-01 DIAGNOSIS — F419 Anxiety disorder, unspecified: Secondary | ICD-10-CM | POA: Diagnosis present

## 2020-11-01 DIAGNOSIS — Z9889 Other specified postprocedural states: Secondary | ICD-10-CM

## 2020-11-01 DIAGNOSIS — R1013 Epigastric pain: Secondary | ICD-10-CM | POA: Diagnosis not present

## 2020-11-01 DIAGNOSIS — R1011 Right upper quadrant pain: Secondary | ICD-10-CM | POA: Diagnosis not present

## 2020-11-01 DIAGNOSIS — K838 Other specified diseases of biliary tract: Secondary | ICD-10-CM | POA: Diagnosis not present

## 2020-11-01 DIAGNOSIS — K839 Disease of biliary tract, unspecified: Secondary | ICD-10-CM | POA: Diagnosis not present

## 2020-11-01 DIAGNOSIS — J449 Chronic obstructive pulmonary disease, unspecified: Secondary | ICD-10-CM | POA: Diagnosis present

## 2020-11-01 DIAGNOSIS — Y848 Other medical procedures as the cause of abnormal reaction of the patient, or of later complication, without mention of misadventure at the time of the procedure: Secondary | ICD-10-CM | POA: Diagnosis present

## 2020-11-01 DIAGNOSIS — F17210 Nicotine dependence, cigarettes, uncomplicated: Secondary | ICD-10-CM | POA: Diagnosis present

## 2020-11-01 DIAGNOSIS — Z6838 Body mass index (BMI) 38.0-38.9, adult: Secondary | ICD-10-CM | POA: Diagnosis not present

## 2020-11-01 DIAGNOSIS — N400 Enlarged prostate without lower urinary tract symptoms: Secondary | ICD-10-CM | POA: Diagnosis present

## 2020-11-01 DIAGNOSIS — T85520A Displacement of bile duct prosthesis, initial encounter: Secondary | ICD-10-CM | POA: Diagnosis not present

## 2020-11-01 DIAGNOSIS — R600 Localized edema: Secondary | ICD-10-CM | POA: Diagnosis present

## 2020-11-01 DIAGNOSIS — K8689 Other specified diseases of pancreas: Secondary | ICD-10-CM | POA: Diagnosis not present

## 2020-11-01 DIAGNOSIS — R945 Abnormal results of liver function studies: Secondary | ICD-10-CM | POA: Diagnosis not present

## 2020-11-01 DIAGNOSIS — E43 Unspecified severe protein-calorie malnutrition: Secondary | ICD-10-CM | POA: Insufficient documentation

## 2020-11-01 DIAGNOSIS — K2289 Other specified disease of esophagus: Secondary | ICD-10-CM | POA: Diagnosis not present

## 2020-11-01 DIAGNOSIS — K859 Acute pancreatitis without necrosis or infection, unspecified: Secondary | ICD-10-CM | POA: Diagnosis not present

## 2020-11-01 DIAGNOSIS — K8591 Acute pancreatitis with uninfected necrosis, unspecified: Secondary | ICD-10-CM | POA: Diagnosis present

## 2020-11-01 DIAGNOSIS — K810 Acute cholecystitis: Secondary | ICD-10-CM | POA: Diagnosis present

## 2020-11-01 DIAGNOSIS — D179 Benign lipomatous neoplasm, unspecified: Secondary | ICD-10-CM | POA: Diagnosis present

## 2020-11-01 DIAGNOSIS — Z888 Allergy status to other drugs, medicaments and biological substances status: Secondary | ICD-10-CM | POA: Diagnosis not present

## 2020-11-01 DIAGNOSIS — K863 Pseudocyst of pancreas: Secondary | ICD-10-CM | POA: Diagnosis present

## 2020-11-01 DIAGNOSIS — Z8249 Family history of ischemic heart disease and other diseases of the circulatory system: Secondary | ICD-10-CM | POA: Diagnosis not present

## 2020-11-01 DIAGNOSIS — F259 Schizoaffective disorder, unspecified: Secondary | ICD-10-CM | POA: Diagnosis present

## 2020-11-01 DIAGNOSIS — F319 Bipolar disorder, unspecified: Secondary | ICD-10-CM | POA: Diagnosis present

## 2020-11-01 DIAGNOSIS — Z978 Presence of other specified devices: Secondary | ICD-10-CM | POA: Diagnosis not present

## 2020-11-01 DIAGNOSIS — S30860A Insect bite (nonvenomous) of lower back and pelvis, initial encounter: Secondary | ICD-10-CM | POA: Diagnosis present

## 2020-11-01 DIAGNOSIS — Z9049 Acquired absence of other specified parts of digestive tract: Secondary | ICD-10-CM

## 2020-11-01 DIAGNOSIS — R609 Edema, unspecified: Secondary | ICD-10-CM | POA: Diagnosis not present

## 2020-11-01 DIAGNOSIS — K831 Obstruction of bile duct: Secondary | ICD-10-CM | POA: Diagnosis present

## 2020-11-01 DIAGNOSIS — Z4659 Encounter for fitting and adjustment of other gastrointestinal appliance and device: Secondary | ICD-10-CM | POA: Diagnosis not present

## 2020-11-01 DIAGNOSIS — K209 Esophagitis, unspecified without bleeding: Secondary | ICD-10-CM | POA: Diagnosis not present

## 2020-11-01 DIAGNOSIS — K2100 Gastro-esophageal reflux disease with esophagitis, without bleeding: Secondary | ICD-10-CM | POA: Diagnosis present

## 2020-11-01 DIAGNOSIS — K5900 Constipation, unspecified: Secondary | ICD-10-CM | POA: Diagnosis present

## 2020-11-01 DIAGNOSIS — K9189 Other postprocedural complications and disorders of digestive system: Secondary | ICD-10-CM

## 2020-11-01 DIAGNOSIS — R21 Rash and other nonspecific skin eruption: Secondary | ICD-10-CM | POA: Diagnosis present

## 2020-11-01 DIAGNOSIS — K3189 Other diseases of stomach and duodenum: Secondary | ICD-10-CM | POA: Diagnosis not present

## 2020-11-01 DIAGNOSIS — D649 Anemia, unspecified: Secondary | ICD-10-CM | POA: Diagnosis present

## 2020-11-01 DIAGNOSIS — Z419 Encounter for procedure for purposes other than remedying health state, unspecified: Secondary | ICD-10-CM

## 2020-11-01 LAB — HEPATITIS C ANTIBODY: Hep C Virus Ab: <0.1 s/co ratio (ref 0.0–0.9)

## 2020-11-01 MED ORDER — PIPERACILLIN-TAZOBACTAM 3.375 G IVPB
3.3750 g | Freq: Three times a day (TID) | INTRAVENOUS | Status: DC
  Filled 2020-11-01 (×3): qty 50

## 2020-11-01 MED ORDER — CYCLOBENZAPRINE HCL 10 MG PO TABS
5.0000 mg | ORAL_TABLET | Freq: Three times a day (TID) | ORAL | Status: DC | PRN
Start: 2020-11-01 — End: 2020-11-05
  Administered 2020-11-01 – 2020-11-05 (×4): 5 mg via ORAL
  Filled 2020-11-01 (×5): qty 1

## 2020-11-01 MED ORDER — MORPHINE SULFATE (PF) 2 MG/ML IV SOLN
2.0000 mg | INTRAVENOUS | Status: DC | PRN
Start: 2020-11-01 — End: 2020-11-06
  Administered 2020-11-02 – 2020-11-06 (×11): 2 mg via INTRAVENOUS
  Filled 2020-11-01 (×11): qty 1

## 2020-11-01 MED ORDER — DIPHENHYDRAMINE HCL 50 MG/ML IJ SOLN: 12.5000 mg | Freq: Four times a day (QID) | INTRAMUSCULAR | Status: DC | PRN

## 2020-11-01 MED ORDER — SODIUM CHLORIDE 0.9 % IV SOLN
1.0000 g | Freq: Three times a day (TID) | INTRAVENOUS | Status: DC
  Administered 2020-11-01 – 2020-11-15 (×42): 1 g via INTRAVENOUS
  Filled 2020-11-01 (×45): qty 1

## 2020-11-01 MED ORDER — PIPERACILLIN-TAZOBACTAM 3.375 G IVPB
3.3750 g | Freq: Once | INTRAVENOUS | Status: DC
  Filled 2020-11-01: qty 50

## 2020-11-01 MED ORDER — METOPROLOL TARTRATE 5 MG/5ML IV SOLN: 5.0000 mg | Freq: Four times a day (QID) | INTRAVENOUS | Status: DC | PRN

## 2020-11-01 MED ORDER — IOHEXOL 300 MG/ML  SOLN
100.0000 mL | Freq: Once | INTRAMUSCULAR | Status: AC | PRN
  Administered 2020-11-01: 100 mL via INTRAVENOUS

## 2020-11-01 MED ORDER — ACETAMINOPHEN 500 MG PO TABS
1000.0000 mg | ORAL_TABLET | Freq: Four times a day (QID) | ORAL | Status: DC
  Administered 2020-11-01 – 2020-11-05 (×17): 1000 mg via ORAL
  Filled 2020-11-01 (×16): qty 2

## 2020-11-01 MED ORDER — RISPERIDONE 1 MG PO TABS
1.0000 mg | ORAL_TABLET | Freq: Every day | ORAL | Status: DC
  Administered 2020-11-01 – 2020-11-04 (×4): 1 mg via ORAL
  Filled 2020-11-01 (×5): qty 1

## 2020-11-01 MED ORDER — PANTOPRAZOLE SODIUM 40 MG IV SOLR
40.0000 mg | Freq: Every day | INTRAVENOUS | Status: DC
  Administered 2020-11-01 – 2020-11-03 (×3): 40 mg via INTRAVENOUS
  Filled 2020-11-01 (×4): qty 40

## 2020-11-01 MED ORDER — ONDANSETRON 4 MG PO TBDP: 4.0000 mg | ORAL_TABLET | Freq: Four times a day (QID) | ORAL | Status: DC | PRN

## 2020-11-01 MED ORDER — ONDANSETRON HCL 4 MG/2ML IJ SOLN: 4.0000 mg | Freq: Four times a day (QID) | INTRAMUSCULAR | Status: DC | PRN

## 2020-11-01 MED ORDER — OXYCODONE HCL 5 MG PO TABS
5.0000 mg | ORAL_TABLET | ORAL | Status: DC | PRN
Start: 2020-11-01 — End: 2020-11-05
  Administered 2020-11-01 – 2020-11-02 (×5): 5 mg via ORAL
  Administered 2020-11-03 – 2020-11-04 (×4): 10 mg via ORAL
  Administered 2020-11-04: 5 mg via ORAL
  Administered 2020-11-05: 10 mg via ORAL
  Filled 2020-11-01 (×2): qty 2
  Filled 2020-11-01 (×2): qty 1
  Filled 2020-11-01: qty 2
  Filled 2020-11-01 (×2): qty 1
  Filled 2020-11-01: qty 2
  Filled 2020-11-01 (×3): qty 1
  Filled 2020-11-01: qty 2

## 2020-11-01 MED ORDER — SIMETHICONE 80 MG PO CHEW
40.0000 mg | CHEWABLE_TABLET | Freq: Four times a day (QID) | ORAL | Status: DC | PRN
  Filled 2020-11-01: qty 1

## 2020-11-01 MED ORDER — DIPHENHYDRAMINE HCL 12.5 MG/5ML PO ELIX
12.5000 mg | ORAL_SOLUTION | Freq: Four times a day (QID) | ORAL | Status: DC | PRN
  Filled 2020-11-01: qty 5

## 2020-11-01 MED ORDER — LACTATED RINGERS IV SOLN: INTRAVENOUS | Status: DC

## 2020-11-01 MED ORDER — TRAZODONE HCL 50 MG PO TABS
150.0000 mg | ORAL_TABLET | Freq: Every day | ORAL | Status: DC
  Filled 2020-11-01: qty 1
  Filled 2020-11-01 (×2): qty 3

## 2020-11-01 MED ORDER — DOCUSATE SODIUM 100 MG PO CAPS: 100.0000 mg | ORAL_CAPSULE | Freq: Two times a day (BID) | ORAL | Status: DC | PRN

## 2020-11-01 MED ORDER — HEPARIN SODIUM (PORCINE) 5000 UNIT/ML IJ SOLN
5000.0000 [IU] | Freq: Three times a day (TID) | INTRAMUSCULAR | Status: DC
  Administered 2020-11-01 – 2020-11-05 (×12): 5000 [IU] via SUBCUTANEOUS
  Filled 2020-11-01 (×13): qty 1

## 2020-11-01 MED ORDER — DOCUSATE SODIUM 100 MG PO CAPS
100.0000 mg | ORAL_CAPSULE | Freq: Two times a day (BID) | ORAL | Status: DC
  Administered 2020-11-01 – 2020-11-05 (×8): 100 mg via ORAL
  Filled 2020-11-01 (×8): qty 1

## 2020-11-01 NOTE — Progress Notes (Signed)
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 Changed to meropenem. Will just do clear liquids for now give pancreas.  Likely pancreas fluid is compressing the biliary stent.  GI aware and will see patient.  Will determine if need to let things rest and follow biliary numbers given pancreatitis and necrosis versus plan for more immediate ercp.  Curlene Labrum, MD

## 2020-11-01 NOTE — Consult Note (Signed)
@LOGO @   Referring Provider: Virl Cagey, MD  Primary Care Physician:  [name], DO Primary Gastroenterologist:  Dr. Laural Golden  Date of Admission: 11/01/20 Date of Consultation: 11/01/20  Reason for Consultation:  Concern for biliary stent occlusion   HPI:  Steve Romero is a 50 y.o. year old male who recently underwent cholecystectomy 10/09/2020 with bile leak s/p JP drain placed and ERCP on 4/7 with sphincterotomy and plastic stent stent placed.  He developed post ERCP pancreatitis.  He improved clinically with supportive measures during his last admission with plans for repeat ERCP in 6-8 weeks.  He was seen yesterday by Dr. Constance Haw for outpatient follow-up.  Reporting feeling okay but not much of an appetite.  Drain output was doing better with yellow serous drainage documenting 75-125 cc output daily.  He was noted to have leukocytosis with WBC 18.5 but afebrile.  CMP and lipase were ordered as well as a CT scan.  He was found to have lipase 97, AST 661, ALT 530, alk phos 976, total bilirubin 3.9.  Notably, LFTs and bilirubin were completely normal 10 days ago.  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  Additional ill-defined fluid/edema seen around the entire pancreas and extending into the underlying retroperitoneum and adjacent mesentery.  Reactive thickening of the walls of the transverse, descending, and ascending colon.  The acute necrotic collection within the pancreas is causing mass-effect on the adjacent gastric antrum/pylorus and proximal duodenum with probable associated reactive bowel wall thickening.  Biliary stent is in place, stable in position.  However, there is intrapelvic bile duct dilation that is mild to moderate in degree, likely due to mass-effect on the stent by the adjacent pancreatic fluid  collection.  Dr. Constance Haw direct admitted patient.  He was started on IV meropenem.  Today:  Today he states overall, his pain is about the same since he was discharged from the hospital.  About 4/10 in severity and primarily on the right side.  He has had intermittent nausea and vomiting at home with total of 2 episodes of emesis, last couple days ago.  Continues with early satiety with associated nausea after couple bites of food.  He has not been eating any solid foods at home, primarily drinking liquids.  He also tells me he has had some increase in his JP drain output.  It had improved to 50 cc daily, and her last couple days, its been draining about 100 cc/day.  Denies fever, chills, cold or flulike symptoms.  Denies confusion or change in mental status.  Bowels been moving okay since surgeries.  Typically with 1-2 watery to mushy BMs daily.  No BRBPR or melena.  He has been taking oxycodone and Tylenol as needed outpatient for pain.  Denies NSAIDs.  Denies alcohol.  Past Medical History:  Diagnosis Date  . Anxiety 10-2014  . Bipolar disorder ([place]) age 50  . COPD (chronic obstructive pulmonary disease) ([place]) 2016  . Depression age 50  . Enlarged prostate   . Hyperlipidemia 2016  . Hypertension 2013  . Schizoaffective disorder ([place]) 11-13-14  . Sleep apnea   . Thyroid disease 11-2014    Past Surgical History:  Procedure Laterality Date  . BILIARY STENT PLACEMENT N/A 10/10/2020   Procedure: BILIARY STENT PLACEMENT;  Surgeon: Rogene Houston, MD;  Location: [facility];  Service: Endoscopy;  Laterality: N/A;  . CARPAL TUNNEL RELEASE  Left 02/21/2016   Procedure: CARPAL TUNNEL RELEASE;  Surgeon: Carole Civil, MD;  Location: [facility];  Service: Orthopedics;  Laterality: Left;  . CHOLECYSTECTOMY N/A 10/09/2020   Procedure: LAPAROSCOPIC CHOLECYSTECTOMY;  Surgeon: Virl Cagey, MD;  Location: [facility];  Service: General;  Laterality: N/A;  . ERCP N/A 10/10/2020   Procedure: ENDOSCOPIC  RETROGRADE CHOLANGIOPANCREATOGRAPHY (ERCP);  Surgeon: Rogene Houston, MD;  Location: [facility];  Service: Endoscopy;  Laterality: N/A;  . HERNIA REPAIR Left 2002   groin  . INGUINAL HERNIA REPAIR Left 04/05/2017   Procedure: RECURRENT HERNIA REPAIR INGUINAL ADULT WITH MESH;  Surgeon: [name], MD;  Location: [facility];  Service: General;  Laterality: Left;  Marland Kitchen MASS EXCISION Right 04/29/2020   Procedure: EXCISION MASS RIGHT WRIST;  Surgeon: [name], MD;  Location: [place];  Service: Orthopedics;  Laterality: Right;  . MASS EXCISION Right 10/09/2020   Procedure: EXCISION MASS, ABDOMINAL WALL, 2CM;  Surgeon: Virl Cagey, MD;  Location: [facility];  Service: General;  Laterality: Right;  . SPHINCTEROTOMY N/A 10/10/2020   Procedure: SPHINCTEROTOMY;  Surgeon: Rogene Houston, MD;  Location: [facility];  Service: Endoscopy;  Laterality: N/A;    Prior to Admission medications   Medication Sig Start Date End Date Taking? Authorizing Provider  amoxicillin-clavulanate (AUGMENTIN) 875-125 MG tablet Take 1 tablet by mouth 2 (two) times daily for 10 days. 10/31/20 11/10/20  Virl Cagey, MD  cyclobenzaprine (FLEXERIL) 10 MG tablet Take 0.5 tablets (5 mg total) by mouth 3 (three) times daily as needed for muscle spasms. 10/15/20   Virl Cagey, MD  docusate sodium (COLACE) 100 MG capsule Take 1 capsule (100 mg total) by mouth 2 (two) times daily as needed for mild constipation (while taking narcotics). 10/15/20   Virl Cagey, MD  ondansetron (ZOFRAN ODT) 4 MG disintegrating tablet Take 1 tablet (4 mg total) by mouth every 8 (eight) hours as needed for nausea or vomiting. 10/15/20   Virl Cagey, MD  oxyCODONE (OXY IR/ROXICODONE) 5 MG immediate release tablet Take 1-2 tablets (5-10 mg total) by mouth every 4 (four) hours as needed for severe pain or breakthrough pain. 10/31/20   Virl Cagey, MD  risperiDONE (RISPERDAL) 1 MG tablet Take 1 tablet (1 mg total) by mouth at  bedtime. Further fills to psychiatry 10/31/20   [name], DO  traZODone (DESYREL) 150 MG tablet Take 1-2 tablets (150-300 mg total) by mouth at bedtime. Further fills per psychiatry 10/31/20   [name], DO    Current Facility-Administered Medications  Medication Dose Route Frequency Provider Last Rate Last Admin  . acetaminophen (TYLENOL) tablet 1,000 mg  1,000 mg Oral Q6H Virl Cagey, MD      . cyclobenzaprine (FLEXERIL) tablet 5 mg  5 mg Oral TID PRN Virl Cagey, MD      . diphenhydrAMINE (BENADRYL) 12.5 MG/5ML elixir 12.5 mg  12.5 mg Oral Q6H PRN Virl Cagey, MD       Or  . diphenhydrAMINE (BENADRYL) injection 12.5 mg  12.5 mg Intravenous Q6H PRN Virl Cagey, MD      . docusate sodium (COLACE) capsule 100 mg  100 mg Oral BID Virl Cagey, MD      . heparin injection 5,000 Units  5,000 Units Subcutaneous Q8H Virl Cagey, MD      . lactated ringers infusion   Intravenous Continuous Virl Cagey, MD      . meropenem (  MERREM) 1 g in sodium chloride 0.9 % 100 mL IVPB  1 g Intravenous Q8H Virl Cagey, MD      . metoprolol tartrate (LOPRESSOR) injection 5 mg  5 mg Intravenous Q6H PRN Virl Cagey, MD      . morphine 2 MG/ML injection 2 mg  2 mg Intravenous Q3H PRN Virl Cagey, MD      . ondansetron (ZOFRAN-ODT) disintegrating tablet 4 mg  4 mg Oral Q6H PRN Virl Cagey, MD       Or  . ondansetron (ZOFRAN) injection 4 mg  4 mg Intravenous Q6H PRN Virl Cagey, MD      . oxyCODONE (Oxy IR/ROXICODONE) immediate release tablet 5-10 mg  5-10 mg Oral Q4H PRN Virl Cagey, MD      . pantoprazole (PROTONIX) injection 40 mg  40 mg Intravenous QHS Curlene Labrum C, MD      . risperiDONE (RISPERDAL) tablet 1 mg  1 mg Oral QHS Virl Cagey, MD      . simethicone [facility]) chewable tablet 40 mg  40 mg Oral Q6H PRN Virl Cagey, MD      . traZODone (DESYREL) tablet 150 mg  150 mg Oral QHS  Virl Cagey, MD        Allergies as of 11/01/2020 - Review Complete 10/31/2020  Allergen Reaction Noted  . Desipramine Nausea Only and Rash 08/24/2012    Family History  Problem Relation Age of Onset  . Diabetes Father   . Heart disease Father   . Heart disease Maternal Grandmother   . Stroke Maternal Grandfather     Social History   Socioeconomic History  . Marital status: Single    Spouse name: Not on file  . Number of children: Not on file  . Years of education: Not on file  . Highest education level: Not on file  Occupational History  . Not on file  Tobacco Use  . Smoking status: Current Every Day Smoker    Packs/day: 0.25    Years: 26.00    Pack years: 6.50    Types: Cigarettes  . Smokeless tobacco: Never Used  . Tobacco comment: 4-5 cig daily as of 10/07/2020.  Vaping Use  . Vaping Use: Never used  Substance and Sexual Activity  . Alcohol use: No  . Drug use: No  . Sexual activity: Never    Birth control/protection: None  Other Topics Concern  . Not on file  Social History Narrative  . Not on file   Social Determinants of Health   Financial Resource Strain: Not on file  Food Insecurity: Not on file  Transportation Needs: Not on file  Physical Activity: Not on file  Stress: Not on file  Social Connections: Not on file  Intimate Partner Violence: Not on file    Review of Systems: Gen: See HPI CV: Denies chest pain or heart palpitations. Resp: Intermittent shortness of breath and intermittent cough. GI: See HPI Heme: See HPI  Physical Exam: Vital signs in last 24 hours: Temp:  [98.2 F (36.8 C)] 98.2 F (36.8 C) (04/29 1430) Pulse Rate:  [93] 93 (04/29 1430) Resp:  [18] 18 (04/29 1430) BP: (157)/(97) 157/97 (04/29 1430) SpO2:  [98 %] 98 % (04/29 1430) Last BM Date: 10/31/20 General:   Alert,  well-developed, well-nourished, pleasant and cooperative in NAD, nontoxic. Head:  Normocephalic and atraumatic. Eyes:  Sclera clear, no  icterus. Conjunctiva pink. Ears:  Normal auditory acuity. Lungs:  Clear  throughout to auscultation.   No wheezes, crackles, or rhonchi. No acute distress. Heart:  Regular rate and rhythm; no murmurs, clicks, rubs,  or gallops. Abdomen: Hypoactive bowel sounds, protuberant, obese abdomen that is soft.  Moderate TTP across the upper abdomen.  JP drain in place in the right upper quadrant with clear yellow-colored fluid drainage in collection bulb.  Without rebound.  Mild voluntary guarding. Rectal:  Deferred Msk:  Symmetrical without gross deformities. Normal posture. Extremities:  Without edema. Neurologic:  Alert and  oriented x4;  grossly normal neurologically. Skin:  Intact without significant lesions or rashes. Psych:  Normal mood and affect.  Intake/Output from previous day: No intake/output data recorded. Intake/Output this shift: Total I/O In: -  Out: 50 [Drains:50]  Lab Results: Recent Labs    10/31/20 0858  WBC 18.5*  HGB 14.6  HCT 43.8  PLT 465*   BMET Recent Labs    10/31/20 1604  NA 137  K 4.1  CL 99  CO2 26  GLUCOSE 136*  BUN 15  CREATININE 0.85  CALCIUM 8.8*   LFT Recent Labs    10/31/20 1604  PROT 7.0  ALBUMIN 2.7*  AST 661*  ALT 530*  ALKPHOS 976*  BILITOT 3.9*   Studies/Results: CT Abdomen Pelvis W Contrast  Result Date: 11/01/2020 CLINICAL DATA:  Intra-abdominal abscess biliary leak, with drain and stent. RIGHT-sided abdominal pain for 3 weeks. EXAM: CT ABDOMEN AND PELVIS WITH CONTRAST TECHNIQUE: Multidetector CT imaging of the abdomen and pelvis was performed using the standard protocol following bolus administration of intravenous contrast. CONTRAST:  161m OMNIPAQUE IOHEXOL 300 MG/ML  SOLN COMPARISON:  CT abdomen dated 10/13/2020. FINDINGS: Lower chest: Mild bibasilar scarring/atelectasis. Hepatobiliary: Biliary stent is in place, stable in position extending from the mid CBD to the duodenum. There is new intrahepatic bile duct dilatation,  mild to moderate in degree. Cholecystectomy clips in place. Surgical drain is stable in position extending to the superior-anterior margin of the gallbladder fossa. No focal liver abnormality is seen. 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 Contiguous component extending into the anterior mesentery measures approximately 7 x 5 cm (series 2, image 43). Additional smaller contiguous component extends inferior to the uncinate process (series 2, image 54) Additional ill-defined fluid/edema is again seen about the entire pancreas and extending into the underlying retroperitoneum and adjacent mesentery. Spleen: Normal in size without focal abnormality. Adrenals/Urinary Tract: Adrenal glands appear normal. Kidneys are unremarkable without mass, stone or hydronephrosis. Bladder is unremarkable, partially decompressed. Stomach/Bowel: No dilated large or small bowel loops. Reactive thickening of the walls of the transverse, descending and ascending colon. The acute necrotic collection within the pancreas is causing mass effect on the adjacent gastric antrum/pylorus and proximal duodenum with probable associated reactive bowel wall thickening. No associated bowel obstruction. Vascular/Lymphatic: No acute appearing vascular abnormality is identified. No pathologically enlarged lymph nodes are identified. Reproductive: Prostate is unremarkable. Other: No free intraperitoneal air. Musculoskeletal: No acute appearing osseous abnormality. Degenerative spondylosis at the L3-4 level, moderate in degree with associated disc space narrowing and articular surface sclerosis. Bilateral chronic pars interarticularis defects also noted  at the L3-4 level. Small RIGHT inguinal hernia which contains fat only. IMPRESSION: 1. New large complex fluid collection involving  the pancreatic head, uncinate process and proximal pancreatic body, measuring up to 18 cm greatest dimension, compatible with acute necrotic collection secondary to an underlying necrotizing pancreatitis. Additional contiguous fluid collections extend into the anterior mesentery (measuring approximately 7 cm greatest dimension) and inferior to the uncinate process (measuring approximately 3.5 cm greatest dimension). 2. Additional ill-defined fluid/edema about the entire pancreas and extending into the underlying retroperitoneum and adjacent mesentery, again compatible with underlying pancreatitis and presumably necrotizing pancreatitis. 3. Biliary stent is in place, stable in position extending from the mid CBD to the duodenum. However, there is new intrahepatic bile duct dilatation that is mild to moderate in degree, likely due to mass effect on the stent by the adjacent pancreatic fluid collection. 4. Reactive thickening of the walls of the transverse, descending and ascending colon. 5. Significant mass effect on the stomach and proximal small bowel by the large pancreatic fluid collection, likely with associated reactive bowel wall thickening. No evidence of associated bowel obstruction. 6. Additional chronic/incidental findings detailed above. These results will be called to the ordering clinician or representative by the Radiologist Assistant, and communication documented in the PACS or [employer]. Electronically Signed   By: Franki Cabot M.D.   On: 11/01/2020 13:58    Impression: 50 year old male recently admitted earlier this month undergoing laparoscopic cholecystectomy 4/6 and found to have gallbladder empyema.  This was complicated by residual cystic duct leak s/p JP drain placement.  He underwent ERCP on 4/7 with sphincterotomy and plastic stent placed.  This was  complicated by post ERCP pancreatitis.  He is now direct admitted by Dr. Constance Haw with concern for biliary stent obstruction in the setting of significantly elevated LFTs (AST 661, ALT 530, alk phos 976, total bilirubin 3.9).  Notably, LFTs were normal 10 days ago.  Lipase mildly elevated at 97.    CT A/P with new large complex fluid collection involving the pancreatic head, uncinate process and proximal pancreatic body measuring up to 18 cm greatest dimension, compatible with acute necrotic collection secondary to an underlying necrotizing pancreatitis.  His biliary stent is in place, stable in position.  However, there is new intrahepatic bile duct dilation that is mild to moderate in degree, likely due to mass-effect on the stent by the adjacent pancreatic fluid collection.  Also with significant mass-effect on the stomach and proximal small bowel without bowel obstruction.  Likely reactive bowel wall thickening.  Clinically, patient is fairly stable since his hospital discharge.  Abdominal pain about 4/10 in severity, which has not changed much since his discharge.  Intermittent nausea and a couple episodes of vomiting since his discharge.  No nausea or vomiting today.  Early satiety with any solid foods, primarily drinking liquids which he tolerates very well.  No fever, change in mental status, or hypotension. He has had increased JP drain output over the last couple of days.  Elevated LFTs likely secondary to large necrotic fluid collection functioning as an outlet obstruction on patient's biliary stent.  Notably, his JP drain output has increased over the last couple of days which is essentially providing biliary decompression.  I do not think he has an acute pancreatitis.  Suspect this is sequela from his original post ERCP pancreatitis a couple weeks ago.  Encouragingly, patient is doing fairly well clinically and is nontoxic.  Recommend treating supportively at this time.  He will need antibiotics  for history of gallbladder empyema and bile leak, IV fluids, sips of clears as tolerated, and we will need  to maintain his JP drain to allow for ongoing decompression.  We will monitor his LFTs daily.  If LFTs continue to climb or if signs/symptoms of cholangitis, would need to consider consulting IR for decompression.  Additionally, he may ultimately end up needing EUS with stenting and management of his necrotic fluid collection, but would prefer to give this a few more weeks to allow for a mature wall.  Plan: 1.  Sips of clears as tolerated. 2.  100 cc/h LR 3.  Continue IV antibiotics. 4.  PPI daily. 5.  Monitor LFTs daily. 6.  If worsening LFTs or signs of cholangitis, will likely need to consult IR for decompression. 7.  May ultimately need EUS with stenting to further manage large necrotic fluid collection.   LOS: 0 days    11/01/2020, 2:40 PM   Aliene Altes, [facility]

## 2020-11-01 NOTE — Progress Notes (Signed)
Pharmacy Antibiotic Note  Steve Romero is a 50 y.o. male admitted on 11/01/2020 with intra-abdominal infection/necrotizing pancreatitis. Pharmacy has been consulted for Meropenem dosing.  Plan: Meropenem 1000 mg IV every 8 hours. Monitor labs, c/s, and patient improvement.     Temp (24hrs), Avg:98.1 F (36.7 C), Min:98.1 F (36.7 C), Max:98.1 F (36.7 C)  Recent Labs  Lab 10/31/20 0858 10/31/20 1604  WBC 18.5*  --   CREATININE  --  0.85    Estimated Creatinine Clearance: 132.9 mL/min (by C-G formula based on SCr of 0.85 mg/dL).    Allergies  Allergen Reactions  . Desipramine Nausea Only and Rash    Antimicrobials this admission: Merrem 4/29 >>    Microbiology results:  Thank you for allowing pharmacy to be a part of this patient's care.  Ramond Craver 11/01/2020 2:14 PM

## 2020-11-01 NOTE — H&P (Signed)
[facility] History and Physical    Steve Romero is a 50 y.o. male.  HPI: Steve Romero is known to me and I saw him yesterday after. Dr. Lajuana Ripple, Koleen Distance, DO. He was feeling ok but just was not quite himself. He had no fevers or chills but leukocytosis to 18. I ordered CMP and lipase and stat CT scan. He has a history of cholecystectomy with biliary leak from cystic duct, ERCP with stent and ERCP pancreatitis after the stent. He has a JP drain in place that has been getting more serous in nature over the last few weeks.  He has been eating and says he just mostly stays hydrated. He does not look like he feels well yesterday. This AM I saw his CMP back with elevated bilirubin and LFTs and lipase. I am worried his stent is dislodged or obstructed.  I have asked him to be admitted after his CT scan for further work up and evaluation. I also want to put him on IV antibiotics pending any cholangitis type picture starting.  I saw him in Radiology on his arrival today. He says he feels bloated but is doing ok.   Past Medical History:  Diagnosis Date  . Anxiety 10-2014  . Bipolar disorder ([place]) age 31  . COPD (chronic obstructive pulmonary disease) ([place]) 2016  . Depression age 22  . Enlarged prostate   . Hyperlipidemia 2016  . Hypertension 2013  . Schizoaffective disorder ([place]) 11-13-14  . Sleep apnea   . Thyroid disease 11-2014    Past Surgical History:  Procedure Laterality Date  . BILIARY STENT PLACEMENT N/A 10/10/2020   Procedure: BILIARY STENT PLACEMENT;  Surgeon: Rogene Houston, MD;  Location: [facility];  Service: Endoscopy;  Laterality: N/A;  . CARPAL TUNNEL RELEASE Left 02/21/2016   Procedure: CARPAL TUNNEL RELEASE;  Surgeon: Carole Civil, MD;  Location: [facility];  Service: Orthopedics;  Laterality: Left;  . CHOLECYSTECTOMY N/A 10/09/2020   Procedure: LAPAROSCOPIC CHOLECYSTECTOMY;  Surgeon: Virl Cagey, MD;  Location: [facility];  Service: General;   Laterality: N/A;  . ERCP N/A 10/10/2020   Procedure: ENDOSCOPIC RETROGRADE CHOLANGIOPANCREATOGRAPHY (ERCP);  Surgeon: Rogene Houston, MD;  Location: [facility];  Service: Endoscopy;  Laterality: N/A;  . HERNIA REPAIR Left 2002   groin  . INGUINAL HERNIA REPAIR Left 04/05/2017   Procedure: RECURRENT HERNIA REPAIR INGUINAL ADULT WITH MESH;  Surgeon: [name], MD;  Location: [facility];  Service: General;  Laterality: Left;  Marland Kitchen MASS EXCISION Right 04/29/2020   Procedure: EXCISION MASS RIGHT WRIST;  Surgeon: [name], MD;  Location: [facility];  Service: Orthopedics;  Laterality: Right;  . MASS EXCISION Right 10/09/2020   Procedure: EXCISION MASS, ABDOMINAL WALL, 2CM;  Surgeon: Virl Cagey, MD;  Location: [facility];  Service: General;  Laterality: Right;  . SPHINCTEROTOMY N/A 10/10/2020   Procedure: SPHINCTEROTOMY;  Surgeon: Rogene Houston, MD;  Location: [facility];  Service: Endoscopy;  Laterality: N/A;    Family History  Problem Relation Age of Onset  . Diabetes Father   . Heart disease Father   . Heart disease Maternal Grandmother   . Stroke Maternal Grandfather     Social History   Tobacco Use  . Smoking status: Current Every Day Smoker    Packs/day: 0.25    Years: 26.00    Pack years: 6.50    Types: Cigarettes  . Smokeless tobacco: Never Used  . Tobacco comment: 4-5 cig daily as of  10/07/2020.  Vaping Use  . Vaping Use: Never used  Substance Use Topics  . Alcohol use: No  . Drug use: No    Medications:  I have reviewed the patient's current medications. Prior to Admission:  Medications Prior to Admission  Medication Sig Dispense Refill Last Dose  . amoxicillin-clavulanate (AUGMENTIN) 875-125 MG tablet Take 1 tablet by mouth 2 (two) times daily for 10 days. 20 tablet 0   . cyclobenzaprine (FLEXERIL) 10 MG tablet Take 0.5 tablets (5 mg total) by mouth 3 (three) times daily as needed for muscle spasms. 30 tablet 1   . docusate sodium (COLACE) 100 MG capsule  Take 1 capsule (100 mg total) by mouth 2 (two) times daily as needed for mild constipation (while taking narcotics). 60 capsule 1   . ondansetron (ZOFRAN ODT) 4 MG disintegrating tablet Take 1 tablet (4 mg total) by mouth every 8 (eight) hours as needed for nausea or vomiting. 30 tablet 1   . oxyCODONE (OXY IR/ROXICODONE) 5 MG immediate release tablet Take 1-2 tablets (5-10 mg total) by mouth every 4 (four) hours as needed for severe pain or breakthrough pain. 30 tablet 0   . risperiDONE (RISPERDAL) 1 MG tablet Take 1 tablet (1 mg total) by mouth at bedtime. Further fills to psychiatry 90 tablet 0   . traZODone (DESYREL) 150 MG tablet Take 1-2 tablets (150-300 mg total) by mouth at bedtime. Further fills per psychiatry 180 tablet 0    Scheduled: . acetaminophen  1,000 mg Oral Q6H  . docusate sodium  100 mg Oral BID  . heparin  5,000 Units Subcutaneous Q8H  . pantoprazole (PROTONIX) IV  40 mg Intravenous QHS  . risperiDONE  1 mg Oral QHS  . traZODone  150-300 mg Oral QHS   Continuous: . lactated ringers    . piperacillin-tazobactam (ZOSYN)  IV     TFT:[id number], diphenhydrAMINE **OR** diphenhydrAMINE, docusate sodium, metoprolol tartrate, morphine injection, ondansetron **OR** ondansetron (ZOFRAN) IV, oxyCODONE, simethicone  Allergies  Allergen Reactions  . Desipramine Nausea Only and Rash      ROS:  A comprehensive review of systems was negative except for: Gastrointestinal: positive for abdominal pain, nausea, reflux symptoms and bloating, JP drain in place with serous output  There were no vitals taken for this visit. Physical Exam Vitals reviewed.  Constitutional:      Appearance: Normal appearance.  HENT:     Head: Normocephalic.     Nose: Nose normal.  Eyes:     Extraocular Movements: Extraocular movements intact.  Cardiovascular:     Rate and Rhythm: Normal rate.  Pulmonary:     Effort: Pulmonary effort is normal.  Abdominal:     General: There is distension.      Palpations: Abdomen is soft.     Tenderness: There is abdominal tenderness.     Comments: JP with serous output, maybe slightly more green tint?   Musculoskeletal:        General: Normal range of motion.  Skin:    General: Skin is warm.  Neurological:     General: No focal deficit present.     Mental Status: He is alert and oriented to person, place, and time.  Psychiatric:        Mood and Affect: Mood normal.        Behavior: Behavior normal.        Thought Content: Thought content normal.        Judgment: Judgment normal.     Results: Results for  orders placed or performed during the hospital encounter of 10/31/20 (from the past 48 hour(s))  Comprehensive metabolic panel     Status: Abnormal   Collection Time: 10/31/20  4:04 PM  Result Value Ref Range   Sodium 137 135 - 145 mmol/L   Potassium 4.1 3.5 - 5.1 mmol/L   Chloride 99 98 - 111 mmol/L   CO2 26 22 - 32 mmol/L   Glucose, Bld 136 (H) 70 - 99 mg/dL    Comment: Glucose reference range applies only to samples taken after fasting for at least 8 hours.   BUN 15 6 - 20 mg/dL   Creatinine, Ser 0.85 0.61 - 1.24 mg/dL   Calcium 8.8 (L) 8.9 - 10.3 mg/dL   Total Protein 7.0 6.5 - 8.1 g/dL   Albumin 2.7 (L) 3.5 - 5.0 g/dL   AST 661 (H) 15 - 41 U/L   ALT 530 (H) 0 - 44 U/L   Alkaline Phosphatase 976 (H) 38 - 126 U/L   Total Bilirubin 3.9 (H) 0.3 - 1.2 mg/dL   GFR, Estimated >60 >60 mL/min    Comment: (NOTE) Calculated using the CKD-EPI Creatinine Equation (2021)    Anion gap 12 5 - 15    Comment: Performed at [facility], [address] [place], [place] [address]  Lipase, blood     Status: Abnormal   Collection Time: 10/31/20  4:12 PM  Result Value Ref Range   Lipase 97 (H) 11 - 51 U/L    Comment: Performed at [facility], [address], [place], [place] [address]   CT pending now   Assessment & Plan:  Steve Romero is a 50 y.o. male with concern for biliary stent obstruction or dislodgement. He has a history of  lap cholecystectomy and bile leak and ERCP with stent and ERCP pancreatitis. Leukocytosis yesterday at PCP and CMP with elevated LFTs noted this AM.   -PRN for pain -IS ,OOB -Will monitor -Diet for now -May need ERCP/ stent adjustment -CT pending, may have pseudocyst based on the CT images I am seeing, read is not done yet -Labs in AM -LR for fluids -Zosyn to cover with cholangitis  -SCDs, heparin sq  -JP drain care.   All questions were answered to the satisfaction of the patient. AC aware and CT aware of plan.     Virl Cagey 11/01/2020, 1:41 PM

## 2020-11-01 NOTE — Telephone Encounter (Signed)
[facility]  Called patient and no answer. Called father and he was in the car with the patient. LFTs all up.   Worried his stent has migrated given the LFTs and the leukocytosis.  Will get him directly admitted after his CT at 1pm.  Discussed with [facility].  Discussed you Dr. Laural Golden who is out of town.   Will admit and get on fluids, will get IV antibiotics given risk of cholangitis.    Curlene Labrum, MD [facility] [address] [place], [place] [address] [phone] (office)

## 2020-11-01 NOTE — Telephone Encounter (Signed)
[facility]  Talked to bed request and orders placed for admission to Med Surg.  Curlene Labrum, MD [facility] [address] [place], [place] [address] [phone] (office)

## 2020-11-01 NOTE — Telephone Encounter (Signed)
Noted  

## 2020-11-02 DIAGNOSIS — R1013 Epigastric pain: Secondary | ICD-10-CM | POA: Diagnosis not present

## 2020-11-02 DIAGNOSIS — R1011 Right upper quadrant pain: Secondary | ICD-10-CM | POA: Diagnosis not present

## 2020-11-02 LAB — CBC WITH DIFFERENTIAL/PLATELET
Abs Immature Granulocytes: 0.16 10*3/uL — ABNORMAL HIGH (ref 0.00–0.07)
Basophils Absolute: 0.1 10*3/uL (ref 0.0–0.1)
Basophils Relative: 1 %
Eosinophils Absolute: 0.2 10*3/uL (ref 0.0–0.5)
Eosinophils Relative: 2 %
HCT: 41.8 % (ref 39.0–52.0)
Hemoglobin: 13.2 g/dL (ref 13.0–17.0)
Immature Granulocytes: 1 %
Lymphocytes Relative: 13 %
Lymphs Abs: 1.6 10*3/uL (ref 0.7–4.0)
MCH: 30 pg (ref 26.0–34.0)
MCHC: 31.6 g/dL (ref 30.0–36.0)
MCV: 95 fL (ref 80.0–100.0)
Monocytes Absolute: 0.7 10*3/uL (ref 0.1–1.0)
Monocytes Relative: 6 %
Neutro Abs: 9.6 10*3/uL — ABNORMAL HIGH (ref 1.7–7.7)
Neutrophils Relative %: 77 %
Platelets: 390 10*3/uL (ref 150–400)
RBC: 4.4 MIL/uL (ref 4.22–5.81)
RDW: 13.2 % (ref 11.5–15.5)
WBC: 12.4 10*3/uL — ABNORMAL HIGH (ref 4.0–10.5)
nRBC: 0 % (ref 0.0–0.2)

## 2020-11-02 LAB — COMPREHENSIVE METABOLIC PANEL
ALT: 551 U/L — ABNORMAL HIGH (ref 0–44)
AST: 309 U/L — ABNORMAL HIGH (ref 15–41)
Albumin: 2.4 g/dL — ABNORMAL LOW (ref 3.5–5.0)
Alkaline Phosphatase: 1060 U/L — ABNORMAL HIGH (ref 38–126)
Anion gap: 9 (ref 5–15)
BUN: 12 mg/dL (ref 6–20)
CO2: 28 mmol/L (ref 22–32)
Calcium: 8.5 mg/dL — ABNORMAL LOW (ref 8.9–10.3)
Chloride: 98 mmol/L (ref 98–111)
Creatinine, Ser: 0.9 mg/dL (ref 0.61–1.24)
GFR, Estimated: >60 mL/min (ref >60)
Glucose, Bld: 136 mg/dL — ABNORMAL HIGH (ref 70–99)
Potassium: 4.2 mmol/L (ref 3.5–5.1)
Sodium: 135 mmol/L (ref 135–145)
Total Bilirubin: 5.4 mg/dL — ABNORMAL HIGH (ref 0.3–1.2)
Total Protein: 6.3 g/dL — ABNORMAL LOW (ref 6.5–8.1)

## 2020-11-02 LAB — MAGNESIUM: Magnesium: 2 mg/dL (ref 1.7–2.4)

## 2020-11-02 LAB — PROTIME-INR
INR: 1.2 (ref 0.8–1.2)
Prothrombin Time: 15.2 seconds (ref 11.4–15.2)

## 2020-11-02 LAB — SARS CORONAVIRUS 2 (TAT 6-24 HRS): SARS Coronavirus 2: NEGATIVE

## 2020-11-02 LAB — LIPASE, BLOOD: Lipase: 108 U/L — ABNORMAL HIGH (ref 11–51)

## 2020-11-02 LAB — PHOSPHORUS: Phosphorus: 3.8 mg/dL (ref 2.5–4.6)

## 2020-11-02 NOTE — Progress Notes (Signed)
Patient did well overnight.  Tolerating clear liquids well.  Rates abdominal pain  3 out of 10 controlled with meds. Appears to have just over 100 cc of slightly cloudy amber-colored fluid coming out of JP overnight.  Vital signs in last 24 hours: Temp:  [97.8 F (36.6 C)-98.5 F (36.9 C)] 97.8 F (36.6 C) (04/30 0434) Pulse Rate:  [89-103] 103 (04/30 0434) Resp:  [18] 18 (04/30 0434) BP: (156-163)/(89-97) 163/94 (04/30 0434) SpO2:  [96 %-98 %] 96 % (04/30 0434) Last BM Date: 10/31/20 General:   Alert, pleasant comfortable appearing gentleman   Abdomen: Full.  Bowel sounds are present.  Mildly diffusely tenderness to palpation.   Extremities:  Without clubbing or edema.    Intake/Output from previous day: 04/29 0701 - 04/30 0700 In: 360 [P.O.:360] Out: 50 [Drains:50] Intake/Output this shift: No intake/output data recorded.  Lab Results: Recent Labs    10/31/20 0858 11/02/20 0625  WBC 18.5* 12.4*  HGB 14.6 13.2  HCT 43.8 41.8  PLT 465* 390   BMET Recent Labs    10/31/20 1604 11/02/20 0625  NA 137 135  K 4.1 4.2  CL 99 98  CO2 26 28  GLUCOSE 136* 136*  BUN 15 12  CREATININE 0.85 0.90  CALCIUM 8.8* 8.5*   LFT Recent Labs    11/02/20 0625  PROT 6.3*  ALBUMIN 2.4*  AST 309*  ALT 551*  ALKPHOS 1,060*  BILITOT 5.4*   PT/INR Recent Labs    11/02/20 0625  LABPROT 15.2  INR 1.2   Hepatitis Panel No results for input(s): HEPBSAG, HCVAB, HEPAIGM, HEPBIGM in the last 72 hours. C-Diff No results for input(s): CDIFFTOX in the last 72 hours.  Studies/Results: CT Abdomen Pelvis W Contrast  Result Date: 11/01/2020 CLINICAL DATA:  Intra-abdominal abscess biliary leak, with drain and stent. RIGHT-sided abdominal pain for 3 weeks. EXAM: CT ABDOMEN AND PELVIS WITH CONTRAST TECHNIQUE: Multidetector CT imaging of the abdomen and pelvis was performed using the standard protocol following bolus administration of intravenous contrast. CONTRAST:  17mL OMNIPAQUE  IOHEXOL 300 MG/ML  SOLN COMPARISON:  CT abdomen dated 10/13/2020. FINDINGS: Lower chest: Mild bibasilar scarring/atelectasis. Hepatobiliary: Biliary stent is in place, stable in position extending from the mid CBD to the duodenum. There is new intrahepatic bile duct dilatation, mild to moderate in degree. Cholecystectomy clips in place. Surgical drain is stable in position extending to the superior-anterior margin of the gallbladder fossa. No focal liver abnormality is seen. 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 Contiguous component extending into the anterior mesentery measures approximately 7 x 5 cm (series 2, image 43). Additional smaller contiguous component extends inferior to the uncinate process (series 2, image 54) Additional ill-defined fluid/edema is again seen about the entire pancreas and extending into the underlying retroperitoneum and adjacent mesentery. Spleen: Normal in size without focal abnormality. Adrenals/Urinary Tract: Adrenal glands appear normal. Kidneys are unremarkable without mass, stone or hydronephrosis. Bladder is unremarkable, partially decompressed. Stomach/Bowel: No dilated large or small bowel loops. Reactive thickening of the walls of the transverse, descending and ascending colon. The acute necrotic collection within the pancreas is causing mass effect on the adjacent gastric antrum/pylorus and proximal duodenum with probable associated reactive bowel wall thickening. No associated  bowel obstruction. Vascular/Lymphatic: No acute appearing vascular abnormality is identified. No pathologically enlarged lymph nodes are identified. Reproductive: Prostate is unremarkable. Other:  No free intraperitoneal air. Musculoskeletal: No acute appearing osseous abnormality. Degenerative spondylosis at the L3-4 level, moderate in degree with associated disc space narrowing and articular surface sclerosis. 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 Small RIGHT inguinal hernia which contains fat only. IMPRESSION: 1. New large complex fluid collection involving the pancreatic head, uncinate process and proximal pancreatic body, measuring up to 18 cm greatest dimension, compatible with acute necrotic collection secondary to an underlying necrotizing pancreatitis. Additional contiguous fluid collections extend into the anterior mesentery (measuring approximately 7 cm greatest dimension) and inferior to the uncinate process (measuring approximately 3.5 cm greatest dimension). 2. Additional ill-defined fluid/edema about the entire pancreas and extending into the underlying retroperitoneum and adjacent mesentery, again compatible with underlying pancreatitis and presumably necrotizing pancreatitis. 3. Biliary stent is in place, stable in position extending from the mid CBD to the duodenum. However, there is new intrahepatic bile duct dilatation that is mild to moderate in degree, likely due to mass effect on the stent by the adjacent pancreatic fluid collection. 4. Reactive thickening of the walls of the transverse, descending and ascending colon. 5. Significant mass effect on the stomach and proximal small bowel by the large pancreatic fluid collection, likely with associated reactive bowel wall thickening. No evidence of associated bowel obstruction. 6. Additional chronic/incidental findings detailed above. These results will be called to the ordering clinician or representative by the Radiologist Assistant, and communication documented in the PACS or [employer]. Electronically Signed   By: Franki Cabot M.D.   On: 11/01/2020 13:58   Impression:  50 year old male  recently status post laparoscopic cholecystectomy complicated by cystic duct stump leak who underwent biliary sphincterotomy and stent placement with Dr. Laural Golden; now readmitted with what appears to be complicated post ERCP pancreatitis. 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  He continues to look clinically nontoxic.  Actually appears quite comfortable.  Uptick in LFTs noted.  JP continues to be functioning.  We may see additional elevation in LFTs before they plateau and start to normalize  Recommendations:  Would continue present course of management.  Would pursue IR biliary decompression if clinical picture deteriorates (progressing obstruction, signs of cholangitis, etc.)  Continue to trend LFTs.  Continue antibiotics.  Would consider advancing to a full liquid diet if okay with Dr. Constance Haw.

## 2020-11-02 NOTE — Progress Notes (Signed)
Updated sister Jacqlyn Larsen on events and plan.  Curlene Labrum, MD

## 2020-11-02 NOTE — Progress Notes (Addendum)
[facility] Progress Note     Subjective: Doing ok and no major pain complaints. Tolerating liquids. CT yesterday with unexpected pancreatitic necrosis and 3 weeks out from his severe pancreatitis. CT with biliary stent compression causing hyperbilirubinemia. No overt GOO currently. Leukocytosis down with meropenem, no signs of infection on CT scan.   Discussed with hepatobiliary surgery colleague / surgical oncologist regarding acute pancreatitic necrosis and biliary compression, and option of repeat ERCP versus PTC for biliary decompression, discussed with Dr. Laural Golden and Dr. Gala Romney. Reviewed management of pancreatic necrosis and have him on meropenem empirically, following labs, orally feeding now as he is tolerating to try to help prevent infection from forming. Want to avoid manipulation of pancreatic necrosis unless infected or symptomatic, biliary obstruction is pushing our hand at this time and he may require ERCP/ EUS in the near future with biliary obstruction and any worsening of gastric outlet symptoms.    Have sent Dr. Jyl [facility]inz [facility] a message to discuss options.   Patient had BM and has taken clears but has some early satiety and nausea at times.   Objective: Vital signs in last 24 hours: Temp:  [97.8 F (36.6 C)-98.5 F (36.9 C)] 97.8 F (36.6 C) (04/30 0434) Pulse Rate:  [89-103] 103 (04/30 0434) Resp:  [18] 18 (04/30 0434) BP: (156-163)/(89-97) 163/94 (04/30 0434) SpO2:  [96 %-98 %] 96 % (04/30 0434) Last BM Date: 10/31/20  Intake/Output from previous day: 04/29 0701 - 04/30 0700 In: 360 [P.O.:360] Out: 50 [Drains:50] Intake/Output this shift: No intake/output data recorded.  General appearance: alert, cooperative and no distress Resp: normal work of breathing GI: soft, distended, JP with yellow suds output, tender in the RUQ  Lab Results:  Recent Labs    10/31/20 0858 11/02/20 0625  WBC 18.5* 12.4*  HGB 14.6 13.2  HCT 43.8 41.8   PLT 465* 390   BMET Recent Labs    10/31/20 1604 11/02/20 0625  NA 137 135  K 4.1 4.2  CL 99 98  CO2 26 28  GLUCOSE 136* 136*  BUN 15 12  CREATININE 0.85 0.90  CALCIUM 8.8* 8.5*   PT/INR Recent Labs    11/02/20 0625  LABPROT 15.2  INR 1.2   Personally reviewed- large area of pancreatic necrosis in the uncinate process, head 18cm, compression of the biliary stent causing some obstruction and intrahepatic dilation, stomach mild distended, no gas in the collection or biliary system, JP in place and minor gas around this as expected  Studies/Results: CT Abdomen Pelvis W Contrast  Result Date: 11/01/2020 CLINICAL DATA:  Intra-abdominal abscess biliary leak, with drain and stent. RIGHT-sided abdominal pain for 3 weeks. EXAM: CT ABDOMEN AND PELVIS WITH CONTRAST TECHNIQUE: Multidetector CT imaging of the abdomen and pelvis was performed using the standard protocol following bolus administration of intravenous contrast. CONTRAST:  187m OMNIPAQUE IOHEXOL 300 MG/ML  SOLN COMPARISON:  CT abdomen dated 10/13/2020. FINDINGS: Lower chest: Mild bibasilar scarring/atelectasis. Hepatobiliary: Biliary stent is in place, stable in position extending from the mid CBD to the duodenum. There is new intrahepatic bile duct dilatation, mild to moderate in degree. Cholecystectomy clips in place. Surgical drain is stable in position extending to the superior-anterior margin of the gallbladder fossa. No focal liver abnormality is seen. 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 Contiguous component extending into the anterior mesentery measures approximately 7 x 5 cm (series 2, image 43). Additional smaller  contiguous component extends inferior to the uncinate process (series 2, image 54) Additional ill-defined fluid/edema is again seen about the entire pancreas and extending into the underlying retroperitoneum and adjacent mesentery. Spleen: Normal in size without focal abnormality. Adrenals/Urinary Tract: Adrenal glands appear normal. Kidneys are unremarkable without mass, stone or hydronephrosis. Bladder is unremarkable, partially decompressed. Stomach/Bowel: No dilated large or small bowel loops. Reactive thickening of the walls of the transverse, descending and ascending colon. The acute necrotic collection within the pancreas is causing mass effect on the adjacent gastric antrum/pylorus and proximal duodenum with probable associated reactive bowel wall thickening. No associated bowel obstruction. Vascular/Lymphatic: No acute appearing vascular abnormality is identified. No pathologically enlarged lymph nodes are identified. Reproductive: Prostate is unremarkable. Other: No free intraperitoneal air. Musculoskeletal: No acute appearing osseous abnormality. Degenerative spondylosis at the L3-4 level, moderate in degree with associated disc space narrowing and articular surface sclerosis. 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 Small RIGHT inguinal hernia which contains fat only. IMPRESSION: 1. New large complex fluid collection involving the pancreatic head, uncinate process and proximal pancreatic body, measuring up to 18 cm greatest dimension, compatible with acute necrotic collection secondary to an underlying necrotizing pancreatitis. Additional contiguous fluid collections extend into the anterior mesentery (measuring approximately 7 cm greatest dimension) and inferior to the uncinate process (measuring approximately 3.5 cm greatest dimension). 2. Additional ill-defined fluid/edema about the entire pancreas and extending into the underlying retroperitoneum and adjacent  mesentery, again compatible with underlying pancreatitis and presumably necrotizing pancreatitis. 3. Biliary stent is in place, stable in position extending from the mid CBD to the duodenum. However, there is new intrahepatic bile duct dilatation that is mild to moderate in degree, likely due to mass effect on the stent by the adjacent pancreatic fluid collection. 4. Reactive thickening of the walls of the transverse, descending and ascending colon. 5. Significant mass effect on the stomach and proximal small bowel by the large pancreatic fluid collection, likely with associated reactive bowel wall thickening. No evidence of associated bowel obstruction. 6. Additional chronic/incidental findings detailed above. These results will be called to the ordering clinician or representative by the Radiologist Assistant, and communication documented in the PACS or [employer]. Electronically Signed   By: Franki Cabot M.D.   On: 11/01/2020 13:58    Anti-infectives: Anti-infectives (From admission, onward)   Start     Dose/Rate Route Frequency Ordered Stop   11/01/20 2200  piperacillin-tazobactam (ZOSYN) IVPB 3.375 g  Status:  Discontinued        3.375 g 12.5 mL/hr over 240 Minutes Intravenous Every 8 hours 11/01/20 1335 11/01/20 1406   11/01/20 1500  meropenem (MERREM) 1 g in sodium chloride 0.9 % 100 mL IVPB        1 g 200 mL/hr over 30 Minutes Intravenous Every 8 hours 11/01/20 1413     11/01/20 1430  piperacillin-tazobactam (ZOSYN) IVPB 3.375 g  Status:  Discontinued        3.375 g 100 mL/hr over 30 Minutes Intravenous  Once 11/01/20 1353 11/01/20 1406      Assessment/Plan: Steve Romero is a very sweet 50 yo who had a laparoscopic cholecystectomy 4/6 with empyema of the gallbladder and torn infundibulum/ Cystic duct junction, bile leak and JP drain placed intraoperatively, ERCP 4/7  with stent and subsequent ERCP pancreatitis, did better post ERCP and discharged home 4/12 with improving labs. Seen in  clinic 4/19 with normal labs and improving JP output. Seen in clinic 4/28 with leukocytosis and hyperbilirubinemia, CT 4/29 found acute pancreatic necrosis (~ 3 weeks out from pancreatitis from ERCP), 4/29 CT scan with large mass effect on biliary system and stent  Currently patient is stable and comfortable. Hemodynamics good and making UOP, no signs of AKI and tolerating diet before admission but having some early satiety and nausea, no GOO at this time.  PRN for pain IS, OOB HD ok, monitor Full liquid diet JP drain and care Labs in AM Dr. Gala Romney, Dr. Laural Golden and myself are discussing options, I am reaching out to hepatobiliary/ surgical oncology colleagues that deal with pancreatic necrosis and determining the best plan moving forward. He is currently stable and we have this option, no signs of cholangitis currently, leukocytosis down on empiric meropenem. SCDs, heparin sq  Plan to update sister, [phone] Steve Romero, left VM on first attempt, no answer on second attempt either   Of note- reticular rash on abdomen for 4+ months, biopsied during surgery and pathologist/ and dermatology pathologist looked at it and no concerning features reported. Pathology back as lipoma.    LOS: 1 day    Virl Cagey 11/02/2020   [phone] [facility]

## 2020-11-03 DIAGNOSIS — R1011 Right upper quadrant pain: Secondary | ICD-10-CM | POA: Diagnosis not present

## 2020-11-03 DIAGNOSIS — R1013 Epigastric pain: Secondary | ICD-10-CM | POA: Diagnosis not present

## 2020-11-03 LAB — CBC WITH DIFFERENTIAL/PLATELET
Abs Immature Granulocytes: 0.11 10*3/uL — ABNORMAL HIGH (ref 0.00–0.07)
Basophils Absolute: 0.1 10*3/uL (ref 0.0–0.1)
Basophils Relative: 1 %
Eosinophils Absolute: 0.4 10*3/uL (ref 0.0–0.5)
Eosinophils Relative: 3 %
HCT: 40.6 % (ref 39.0–52.0)
Hemoglobin: 12.8 g/dL — ABNORMAL LOW (ref 13.0–17.0)
Immature Granulocytes: 1 %
Lymphocytes Relative: 15 %
Lymphs Abs: 1.8 10*3/uL (ref 0.7–4.0)
MCH: 29.7 pg (ref 26.0–34.0)
MCHC: 31.5 g/dL (ref 30.0–36.0)
MCV: 94.2 fL (ref 80.0–100.0)
Monocytes Absolute: 0.7 10*3/uL (ref 0.1–1.0)
Monocytes Relative: 6 %
Neutro Abs: 8.6 10*3/uL — ABNORMAL HIGH (ref 1.7–7.7)
Neutrophils Relative %: 74 %
Platelets: 338 10*3/uL (ref 150–400)
RBC: 4.31 MIL/uL (ref 4.22–5.81)
RDW: 13.2 % (ref 11.5–15.5)
WBC: 11.7 10*3/uL — ABNORMAL HIGH (ref 4.0–10.5)
nRBC: 0 % (ref 0.0–0.2)

## 2020-11-03 LAB — COMPREHENSIVE METABOLIC PANEL
ALT: 311 U/L — ABNORMAL HIGH (ref 0–44)
AST: 79 U/L — ABNORMAL HIGH (ref 15–41)
Albumin: 2.4 g/dL — ABNORMAL LOW (ref 3.5–5.0)
Alkaline Phosphatase: 908 U/L — ABNORMAL HIGH (ref 38–126)
Anion gap: 11 (ref 5–15)
BUN: 8 mg/dL (ref 6–20)
CO2: 25 mmol/L (ref 22–32)
Calcium: 8.1 mg/dL — ABNORMAL LOW (ref 8.9–10.3)
Chloride: 100 mmol/L (ref 98–111)
Creatinine, Ser: 0.88 mg/dL (ref 0.61–1.24)
GFR, Estimated: >60 mL/min (ref >60)
Glucose, Bld: 115 mg/dL — ABNORMAL HIGH (ref 70–99)
Potassium: 3.7 mmol/L (ref 3.5–5.1)
Sodium: 136 mmol/L (ref 135–145)
Total Bilirubin: 2.5 mg/dL — ABNORMAL HIGH (ref 0.3–1.2)
Total Protein: 6 g/dL — ABNORMAL LOW (ref 6.5–8.1)

## 2020-11-03 LAB — LIPASE, BLOOD: Lipase: 100 U/L — ABNORMAL HIGH (ref 11–51)

## 2020-11-03 NOTE — Progress Notes (Addendum)
[facility] Progress Note     Subjective: LFTs improving. Says his epigastric region still feels bloated and he feels full. No increasing pain. JP with some more output, yellow/ cloudy but not frank bile. Leukocytosis down again. No fevers.   Have not heard back from Hepatobiliary surgeon, but will reach out again.   Objective: Vital signs in last 24 hours: Temp:  [97.8 F (36.6 C)-98.3 F (36.8 C)] 97.8 F (36.6 C) (05/01 0538) Pulse Rate:  [90-97] 97 (05/01 0538) Resp:  [18] 18 (04/30 2109) BP: (127-139)/(85-89) 127/85 (05/01 0538) SpO2:  [93 %-95 %] 94 % (05/01 0538) Last BM Date: 10/31/20  Intake/Output from previous day: 04/30 0701 - 05/01 0700 In: 600 [P.O.:600] Out: 180 [Drains:180] Intake/Output this shift: No intake/output data recorded.  General appearance: alert, cooperative and no distress Resp: normal work of breathing GI: soft, distender, tender epigastric region, JP wth yellow fluid/ serous/ a little more cloudy, some suds, bandaid over biopsy site  Lab Results:  Recent Labs    11/02/20 0625 11/03/20 0444  WBC 12.4* 11.7*  HGB 13.2 12.8*  HCT 41.8 40.6  PLT 390 338   BMET Recent Labs    11/02/20 0625 11/03/20 0444  NA 135 136  K 4.2 3.7  CL 98 100  CO2 28 25  GLUCOSE 136* 115*  BUN 12 8  CREATININE 0.90 0.88  CALCIUM 8.5* 8.1*   PT/INR Recent Labs    11/02/20 0625  LABPROT 15.2  INR 1.2    Studies/Results: CT Abdomen Pelvis W Contrast  Result Date: 11/01/2020 CLINICAL DATA:  Intra-abdominal abscess biliary leak, with drain and stent. RIGHT-sided abdominal pain for 3 weeks. EXAM: CT ABDOMEN AND PELVIS WITH CONTRAST TECHNIQUE: Multidetector CT imaging of the abdomen and pelvis was performed using the standard protocol following bolus administration of intravenous contrast. CONTRAST:  169mL OMNIPAQUE IOHEXOL 300 MG/ML  SOLN COMPARISON:  CT abdomen dated 10/13/2020. FINDINGS: Lower chest: Mild bibasilar  scarring/atelectasis. Hepatobiliary: Biliary stent is in place, stable in position extending from the mid CBD to the duodenum. There is new intrahepatic bile duct dilatation, mild to moderate in degree. Cholecystectomy clips in place. Surgical drain is stable in position extending to the superior-anterior margin of the gallbladder fossa. No focal liver abnormality is seen. 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 Contiguous component extending into the anterior mesentery measures approximately 7 x 5 cm (series 2, image 43). Additional smaller contiguous component extends inferior to the uncinate process (series 2, image 54) Additional ill-defined fluid/edema is again seen about the entire pancreas and extending into the underlying retroperitoneum and adjacent mesentery. Spleen: Normal in size without focal abnormality. Adrenals/Urinary Tract: Adrenal glands appear normal. Kidneys are unremarkable without mass, stone or hydronephrosis. Bladder is unremarkable, partially decompressed. Stomach/Bowel: No dilated large or small bowel loops. Reactive thickening of the walls of the transverse, descending and ascending colon. The acute necrotic collection within the pancreas is causing mass effect on the adjacent gastric antrum/pylorus and proximal duodenum with probable associated reactive bowel wall thickening. No associated bowel obstruction. Vascular/Lymphatic: No acute appearing vascular abnormality is identified. No pathologically enlarged lymph nodes are identified. Reproductive: Prostate is unremarkable. Other: No free intraperitoneal air. Musculoskeletal: No acute appearing osseous abnormality.  Degenerative  spondylosis at the L3-4 level, moderate in degree with associated disc space narrowing and articular surface sclerosis. 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 Small RIGHT inguinal hernia which contains fat only. IMPRESSION: 1. New large complex fluid collection involving the pancreatic head, uncinate process and proximal pancreatic body, measuring up to 18 cm greatest dimension, compatible with acute necrotic collection secondary to an underlying necrotizing pancreatitis. Additional contiguous fluid collections extend into the anterior mesentery (measuring approximately 7 cm greatest dimension) and inferior to the uncinate process (measuring approximately 3.5 cm greatest dimension). 2. Additional ill-defined fluid/edema about the entire pancreas and extending into the underlying retroperitoneum and adjacent mesentery, again compatible with underlying pancreatitis and presumably necrotizing pancreatitis. 3. Biliary stent is in place, stable in position extending from the mid CBD to the duodenum. However, there is new intrahepatic bile duct dilatation that is mild to moderate in degree, likely due to mass effect on the stent by the adjacent pancreatic fluid collection. 4. Reactive thickening of the walls of the transverse, descending and ascending colon. 5. Significant mass effect on the stomach and proximal small bowel by the large pancreatic fluid collection, likely with associated reactive bowel wall thickening. No evidence of associated bowel obstruction. 6. Additional chronic/incidental findings detailed above. These results will be called to the ordering clinician or representative by the Radiologist Assistant, and communication documented in the PACS or [employer]. Electronically Signed   By: Franki Cabot M.D.   On: 11/01/2020 13:58    Anti-infectives: Anti-infectives (From admission, onward)   Start     Dose/Rate Route Frequency Ordered Stop   11/01/20 2200   piperacillin-tazobactam (ZOSYN) IVPB 3.375 g  Status:  Discontinued        3.375 g 12.5 mL/hr over 240 Minutes Intravenous Every 8 hours 11/01/20 1335 11/01/20 1406   11/01/20 1500  meropenem (MERREM) 1 g in sodium chloride 0.9 % 100 mL IVPB        1 g 200 mL/hr over 30 Minutes Intravenous Every 8 hours 11/01/20 1413     11/01/20 1430  piperacillin-tazobactam (ZOSYN) IVPB 3.375 g  Status:  Discontinued        3.375 g 100 mL/hr over 30 Minutes Intravenous  Once 11/01/20 1353 11/01/20 1406      Assessment/Plan: Mr. Pabst is a very sweet 50 yo who had a laparoscopic cholecystectomy 4/6 with empyema of the gallbladder and torn infundibulum/ Cystic duct junction, bile leak and JP drain placed intraoperatively, ERCP 4/7 with stent and subsequent ERCP pancreatitis, did better post ERCP and discharged home 4/12 with improving labs. Seen in clinic 4/19 with normal labs and improving JP output. Seen in clinic 4/28 with leukocytosis and hyperbilirubinemia, CT 4/29 found acute pancreatic necrosis (~ 3 weeks out from pancreatitis from ERCP), 4/29 CT scan with large mass effect on biliary system and stent.  Overall doing ok and feeling ok. LFTs down. May have some decompression from JP drain versus pancreas softening? Unsure at this time. Leukocytosis resolving.   Talked to his sister yesterday and discussed plan and chance for [place] to discuss the options with other surgeons/ GI and get the best plan.    PRN for pain IS, OOB HD ok, monitor Full liquid diet JP drain and care Labs in AM Meropenem empirically for pancreatic necrosis and risk of cholangitis  SCDs, heparin sq  Discussed with Dr. Gala Romney. Plan to continue to discuss with additional surgeons.  May need to get repeat imaging at some  point.   Of note- reticular rash on abdomen for 4+ months, biopsied during surgery and pathologist/ and dermatology pathologist looked at it and no concerning features reported. Pathology back as lipoma.     LOS: 2 days    Virl Cagey 11/03/2020

## 2020-11-03 NOTE — Progress Notes (Signed)
Pharmacy Antibiotic Note  Steve Romero is a 50 y.o. male admitted on 11/01/2020 with intra-abdominal infection/necrotizing pancreatitis. Pharmacy has been consulted for Meropenem dosing.  Plan: Meropenem 1000 mg IV every 8 hours. Monitor labs, c/s, and patient improvement.     Temp (24hrs), Avg:98 F (36.7 C), Min:97.8 F (36.6 C), Max:98.3 F (36.8 C)  Recent Labs  Lab 10/31/20 0858 10/31/20 1604 11/02/20 0625 11/03/20 0444  WBC 18.5*  --  12.4* 11.7*  CREATININE  --  0.85 0.90 0.88    Estimated Creatinine Clearance: 128.4 mL/min (by C-G formula based on SCr of 0.88 mg/dL).    Allergies  Allergen Reactions  . Desipramine Nausea Only and Rash    Antimicrobials this admission: Merrem 4/29 >>    Microbiology results:  Thank you for allowing pharmacy to be a part of this patient's care.  Donna Christen Theotis Gerdeman 11/03/2020 2:21 PM

## 2020-11-03 NOTE — Progress Notes (Signed)
Patient reports a good night.  Gets some bloating discomfort as he finishes his full liquid meal.  No nausea or vomiting.  JP continues to drain.  Vital signs in last 24 hours: Temp:  [97.8 F (36.6 C)-98.3 F (36.8 C)] 97.8 F (36.6 C) (05/01 0538) Pulse Rate:  [90-97] 97 (05/01 0538) Resp:  [18] 18 (04/30 2109) BP: (127-139)/(85-89) 127/85 (05/01 0538) SpO2:  [93 %-95 %] 94 % (05/01 0538) Last BM Date: 10/31/20 General:   Alert, pleasant and cooperative in NAD; accompanied by his sister Abdomen: Abdomen is soft with some mild periumbilical tenderness. Intake/Output from previous day: 04/30 0701 - 05/01 0700 In: 600 [P.O.:600] Out: 180 [Drains:180] Intake/Output this shift: Total I/O In: 240 [P.O.:240] Out: -   Lab Results: Recent Labs    11/02/20 0625 11/03/20 0444  WBC 12.4* 11.7*  HGB 13.2 12.8*  HCT 41.8 40.6  PLT 390 338   BMET Recent Labs    10/31/20 1604 11/02/20 0625 11/03/20 0444  NA 137 135 136  K 4.1 4.2 3.7  CL 99 98 100  CO2 26 28 25   GLUCOSE 136* 136* 115*  BUN 15 12 8   CREATININE 0.85 0.90 0.88  CALCIUM 8.8* 8.5* 8.1*   LFT Recent Labs    11/03/20 0444  PROT 6.0*  ALBUMIN 2.4*  AST 79*  ALT 311*  ALKPHOS 908*  BILITOT 2.5*   PT/INR Recent Labs    11/02/20 0625  LABPROT 15.2  INR 1.2   Hepatitis Panel No results for input(s): HEPBSAG, HCVAB, HEPAIGM, HEPBIGM in the last 72 hours. C-Diff No results for input(s): CDIFFTOX in the last 72 hours.  Studies/Results: CT Abdomen Pelvis W Contrast  Result Date: 11/01/2020 CLINICAL DATA:  Intra-abdominal abscess biliary leak, with drain and stent. RIGHT-sided abdominal pain for 3 weeks. EXAM: CT ABDOMEN AND PELVIS WITH CONTRAST TECHNIQUE: Multidetector CT imaging of the abdomen and pelvis was performed using the standard protocol following bolus administration of intravenous contrast. CONTRAST:  143mL OMNIPAQUE IOHEXOL 300 MG/ML  SOLN COMPARISON:  CT abdomen dated 10/13/2020. FINDINGS:  Lower chest: Mild bibasilar scarring/atelectasis. Hepatobiliary: Biliary stent is in place, stable in position extending from the mid CBD to the duodenum. There is new intrahepatic bile duct dilatation, mild to moderate in degree. Cholecystectomy clips in place. Surgical drain is stable in position extending to the superior-anterior margin of the gallbladder fossa. No focal liver abnormality is seen. 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 Contiguous component extending into the anterior mesentery measures approximately 7 x 5 cm (series 2, image 43). Additional smaller contiguous component extends inferior to the uncinate process (series 2, image 54) Additional ill-defined fluid/edema is again seen about the entire pancreas and extending into the underlying retroperitoneum and adjacent mesentery. Spleen: Normal in size without focal abnormality. Adrenals/Urinary Tract: Adrenal glands appear normal. Kidneys are unremarkable without mass, stone or hydronephrosis. Bladder is unremarkable, partially decompressed. Stomach/Bowel: No dilated large or small bowel loops. Reactive thickening of the walls of the transverse, descending and ascending colon. The acute necrotic collection within the pancreas is causing mass effect on the adjacent gastric antrum/pylorus and proximal duodenum with probable associated reactive bowel wall thickening. No associated bowel obstruction. Vascular/Lymphatic: No acute appearing vascular abnormality is  identified. No pathologically enlarged lymph nodes are identified. Reproductive: Prostate is unremarkable. Other: No free intraperitoneal air. Musculoskeletal: No acute appearing osseous  abnormality. Degenerative spondylosis at the L3-4 level, moderate in degree with associated disc space narrowing and articular surface sclerosis. 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 Small RIGHT inguinal hernia which contains fat only. IMPRESSION: 1. New large complex fluid collection involving the pancreatic head, uncinate process and proximal pancreatic body, measuring up to 18 cm greatest dimension, compatible with acute necrotic collection secondary to an underlying necrotizing pancreatitis. Additional contiguous fluid collections extend into the anterior mesentery (measuring approximately 7 cm greatest dimension) and inferior to the uncinate process (measuring approximately 3.5 cm greatest dimension). 2. Additional ill-defined fluid/edema about the entire pancreas and extending into the underlying retroperitoneum and adjacent mesentery, again compatible with underlying pancreatitis and presumably necrotizing pancreatitis. 3. Biliary stent is in place, stable in position extending from the mid CBD to the duodenum. However, there is new intrahepatic bile duct dilatation that is mild to moderate in degree, likely due to mass effect on the stent by the adjacent pancreatic fluid collection. 4. Reactive thickening of the walls of the transverse, descending and ascending colon. 5. Significant mass effect on the stomach and proximal small bowel by the large pancreatic fluid collection, likely with associated reactive bowel wall thickening. No evidence of associated bowel obstruction. 6. Additional chronic/incidental findings detailed above. These results will be called to the ordering clinician or representative by the Radiologist Assistant, and communication documented in the PACS or [employer]. Electronically Signed   By: Franki Cabot M.D.   On: 11/01/2020 13:58   Impression:  50 year old male recently status post laparoscopic cholecystectomy complicated by cystic  duct stump leak who underwent biliary sphincterotomy and biliary stent placement by Dr. Laural Golden; now readmitted with what appears to be complicated post ERCP pancreatitis. 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  He continues to look good clinically.  He is not febrile or tachycardic  Moreover, his leukocytosis is improved and we have seen a nice decline in his elevated LFTs.  I take the decline in LFTs as compelling biochemical evidence of biliary tree decompression (either via JP or via the ampulla or combination of the two). At any rate, this finding is reassuring.  Recommendations:  1.  Continue full liquid diet  2.  Continue antibiotics for now  3.  Continue to trend LFTs  Will reassess tomorrow morning.  Had a lengthy discussion with patient and his sister regarding the fluid nature of our decision making and recommendations given the complex nature of his problem. If he continues to look good clinically and displays continued improvement in his LFT's,  it may well be that we could make arrangements for semiurgent outpatient referral to [facility] to see one of the biliary endoscopists (perhaps Dr. Delrae Alfred or Newman Pies) to assist with further management/intervention as appropriate depending on his clinical course. Lets continue to preserve his JP drain.  I have discussed with Dr. Constance Haw this morning.

## 2020-11-04 ENCOUNTER — Telehealth: Payer: Self-pay | Admitting: Gastroenterology

## 2020-11-04 ENCOUNTER — Inpatient Hospital Stay (HOSPITAL_COMMUNITY): Payer: Medicaid Other

## 2020-11-04 DIAGNOSIS — R1013 Epigastric pain: Secondary | ICD-10-CM

## 2020-11-04 LAB — COMPREHENSIVE METABOLIC PANEL
ALT: 201 U/L — ABNORMAL HIGH (ref 0–44)
ALT: 164 U/L — ABNORMAL HIGH (ref 0–44)
AST: 45 U/L — ABNORMAL HIGH (ref 15–41)
AST: 60 U/L — ABNORMAL HIGH (ref 15–41)
Albumin: 2.4 g/dL — ABNORMAL LOW (ref 3.5–5.0)
Albumin: 2.2 g/dL — ABNORMAL LOW (ref 3.5–5.0)
Alkaline Phosphatase: 924 U/L — ABNORMAL HIGH (ref 38–126)
Alkaline Phosphatase: 1045 U/L — ABNORMAL HIGH (ref 38–126)
Anion gap: 5 (ref 5–15)
Anion gap: 7 (ref 5–15)
BUN: 9 mg/dL (ref 6–20)
BUN: 12 mg/dL (ref 6–20)
CO2: 30 mmol/L (ref 22–32)
CO2: 28 mmol/L (ref 22–32)
Calcium: 8.4 mg/dL — ABNORMAL LOW (ref 8.9–10.3)
Calcium: 8.1 mg/dL — ABNORMAL LOW (ref 8.9–10.3)
Chloride: 103 mmol/L (ref 98–111)
Chloride: 101 mmol/L (ref 98–111)
Creatinine, Ser: 0.83 mg/dL (ref 0.61–1.24)
Creatinine, Ser: 0.94 mg/dL (ref 0.61–1.24)
GFR, Estimated: >60 mL/min (ref >60)
GFR, Estimated: >60 mL/min (ref >60)
Glucose, Bld: 124 mg/dL — ABNORMAL HIGH (ref 70–99)
Glucose, Bld: 135 mg/dL — ABNORMAL HIGH (ref 70–99)
Potassium: 4.2 mmol/L (ref 3.5–5.1)
Potassium: 4.3 mmol/L (ref 3.5–5.1)
Sodium: 138 mmol/L (ref 135–145)
Sodium: 136 mmol/L (ref 135–145)
Total Bilirubin: 2.4 mg/dL — ABNORMAL HIGH (ref 0.3–1.2)
Total Bilirubin: 3.9 mg/dL — ABNORMAL HIGH (ref 0.3–1.2)
Total Protein: 6.2 g/dL — ABNORMAL LOW (ref 6.5–8.1)
Total Protein: 6 g/dL — ABNORMAL LOW (ref 6.5–8.1)

## 2020-11-04 LAB — CBC WITH DIFFERENTIAL/PLATELET
Abs Immature Granulocytes: 0.07 10*3/uL (ref 0.00–0.07)
Abs Immature Granulocytes: 0.07 10*3/uL (ref 0.00–0.07)
Basophils Absolute: 0.1 10*3/uL (ref 0.0–0.1)
Basophils Absolute: 0.1 10*3/uL (ref 0.0–0.1)
Basophils Relative: 1 %
Basophils Relative: 1 %
Eosinophils Absolute: 0.4 10*3/uL (ref 0.0–0.5)
Eosinophils Absolute: 0.1 10*3/uL (ref 0.0–0.5)
Eosinophils Relative: 4 %
Eosinophils Relative: 1 %
HCT: 39.3 % (ref 39.0–52.0)
HCT: 38.4 % — ABNORMAL LOW (ref 39.0–52.0)
Hemoglobin: 12.1 g/dL — ABNORMAL LOW (ref 13.0–17.0)
Hemoglobin: 12.2 g/dL — ABNORMAL LOW (ref 13.0–17.0)
Immature Granulocytes: 1 %
Immature Granulocytes: 1 %
Lymphocytes Relative: 13 %
Lymphocytes Relative: 8 %
Lymphs Abs: 1.4 10*3/uL (ref 0.7–4.0)
Lymphs Abs: 1 10*3/uL (ref 0.7–4.0)
MCH: 29.7 pg (ref 26.0–34.0)
MCH: 30 pg (ref 26.0–34.0)
MCHC: 30.8 g/dL (ref 30.0–36.0)
MCHC: 31.8 g/dL (ref 30.0–36.0)
MCV: 96.3 fL (ref 80.0–100.0)
MCV: 94.3 fL (ref 80.0–100.0)
Monocytes Absolute: 0.6 10*3/uL (ref 0.1–1.0)
Monocytes Absolute: 0.6 10*3/uL (ref 0.1–1.0)
Monocytes Relative: 6 %
Monocytes Relative: 5 %
Neutro Abs: 8 10*3/uL — ABNORMAL HIGH (ref 1.7–7.7)
Neutro Abs: 9.7 10*3/uL — ABNORMAL HIGH (ref 1.7–7.7)
Neutrophils Relative %: 75 %
Neutrophils Relative %: 84 %
Platelets: 342 10*3/uL (ref 150–400)
Platelets: 335 10*3/uL (ref 150–400)
RBC: 4.08 MIL/uL — ABNORMAL LOW (ref 4.22–5.81)
RBC: 4.07 MIL/uL — ABNORMAL LOW (ref 4.22–5.81)
RDW: 13.6 % (ref 11.5–15.5)
RDW: 13.7 % (ref 11.5–15.5)
WBC: 10.5 10*3/uL (ref 4.0–10.5)
WBC: 11.5 10*3/uL — ABNORMAL HIGH (ref 4.0–10.5)
nRBC: 0 % (ref 0.0–0.2)
nRBC: 0 % (ref 0.0–0.2)

## 2020-11-04 LAB — LIPASE, BLOOD: Lipase: 74 U/L — ABNORMAL HIGH (ref 11–51)

## 2020-11-04 MED ORDER — PANTOPRAZOLE SODIUM 40 MG PO TBEC
40.0000 mg | DELAYED_RELEASE_TABLET | Freq: Every day | ORAL | Status: DC
  Administered 2020-11-04 – 2020-11-05 (×2): 40 mg via ORAL
  Filled 2020-11-04 (×2): qty 1

## 2020-11-04 MED ORDER — IBUPROFEN 800 MG PO TABS
800.0000 mg | ORAL_TABLET | Freq: Four times a day (QID) | ORAL | Status: DC | PRN
  Administered 2020-11-04 – 2020-11-05 (×2): 800 mg via ORAL
  Filled 2020-11-04: qty 1
  Filled 2020-11-04: qty 2

## 2020-11-04 NOTE — Progress Notes (Signed)
[facility]  Fever again. CXR with atelectasis, repeating labs, plan to put on telemetry. Continue meropenem. May need to repeat CT tomorrow.  Curlene Labrum MD

## 2020-11-04 NOTE — TOC Initial Note (Signed)
CSW contacted about possible transportation needs. CSW spoke with pt who states that he has been established with [facility] and he recieves vouchers. CSW informed pt that it was important he reached out to them about getting him to his upcoming appointments as soon as possible as the appointment times and frequency are unknown.

## 2020-11-04 NOTE — Plan of Care (Signed)

## 2020-11-04 NOTE — Progress Notes (Signed)
   11/04/20 1842  Assess: MEWS Score  Temp (!) 101.5 F (38.6 C)  BP (!) 144/77  Pulse Rate (!) 115  Resp (!) 28  Level of Consciousness Alert  SpO2 94 %  O2 Device Room Air  Assess: MEWS Score  MEWS Temp 2  MEWS Systolic 0  MEWS Pulse 2  MEWS RR 2  MEWS LOC 0  MEWS Score 6  MEWS Score Color Red  Assess: if the MEWS score is Yellow or Red  Were vital signs taken at a resting state? Yes  Focused Assessment Change from prior assessment (see assessment flowsheet)  Early Detection of Sepsis Score *See Row Information* Low  MEWS guidelines implemented *See Row Information* Yes  Treat  Pain Scale 0-10  Pain Score 0  Take Vital Signs  Increase Vital Sign Frequency  Red: Q 1hr X 4 then Q 4hr X 4, if remains red, continue Q 4hrs  Notify: Charge Nurse/RN  Name of Charge Nurse/RN Notified Juanell Fairly, RN  Date Charge Nurse/RN Notified 11/04/20  Time Charge Nurse/RN Notified 1845  Notify: Provider  Provider Name/Title Dr. Constance Haw  Date Provider Notified 11/04/20

## 2020-11-04 NOTE — Telephone Encounter (Signed)
I have left a message at [facility] with Sharon, [place] [phone]). who works with Drs. Pawa and Newman Pies regarding patient's status. Will review with her clinical information when call returned so we may expedite referral.

## 2020-11-04 NOTE — Telephone Encounter (Signed)
Spoke with Crystal again. Patient to be seen by Dr. Delrae Alfred next Tuesday at [facility] to arrange CT with dedicated pancreatic protocol prior to visit and will be contacting patient regarding all details.

## 2020-11-04 NOTE — Progress Notes (Signed)
[facility]  Labs overall about the same. Bilirubin bounced back up slightly. Stopped fluids today which could have some effect. Fever potentially from atelectasis but could be related to biliary system. Will see what labs look like in AM and potential repeat imaging in AM.  Curlene Labrum, MD

## 2020-11-04 NOTE — Progress Notes (Signed)
[facility]  Updated mother and sister about plan. Outpatient follow up with GI [facility] Tuesday 5/10 with CT.  Low grade fever now. CXR ordered and IS. Will Monitor.   Potentially home tomorrow pending fever work up. Changes. Follow up with PCP Friday so someone can see and repeat labs to keep close check on patient.  Curlene Labrum, Md

## 2020-11-04 NOTE — Progress Notes (Signed)
    Subjective: Early satiety noted but tolerating diet. Epigastric and RUQ pain still present but improving from admission. No N/V. Afebrile. +flatus. Feels he is improving from admission.   Objective: Vital signs in last 24 hours: Temp:  [97.6 F (36.4 C)-97.9 F (36.6 C)] 97.9 F (36.6 C) (05/02 0524) Pulse Rate:  [83-98] 87 (05/02 0524) Resp:  [16] 16 (05/02 0524) BP: (133-145)/(76-89) 145/89 (05/02 0524) SpO2:  [94 %-99 %] 98 % (05/02 0524) Last BM Date: 11/03/20 General:   Alert and oriented, pleasant Head:  Normocephalic and atraumatic. Abdomen:  Bowel sounds present, obese, distended but not tight, JP with serous drainage, TTP RUQ and epigastric  Extremities:  Without  edema. Neurologic:  Alert and  oriented x4 Psych:  Alert and cooperative. Normal mood and affect.  Intake/Output from previous day: 05/01 0701 - 05/02 0700 In: 720 [P.O.:720] Out: 101 [Urine:1; Drains:100] Intake/Output this shift: No intake/output data recorded.  Lab Results: Recent Labs    11/02/20 0625 11/03/20 0444 11/04/20 0633  WBC 12.4* 11.7* 10.5  HGB 13.2 12.8* 12.1*  HCT 41.8 40.6 39.3  PLT 390 338 342   BMET Recent Labs    11/02/20 0625 11/03/20 0444 11/04/20 0633  NA 135 136 138  K 4.2 3.7 4.2  CL 98 100 103  CO2 _0 GLUCOSE 136* 115* 124*  BUN _1 CREATININE 0.90 0.88 0.83  CALCIUM 8.5* 8.1* 8.4*   LFT Recent Labs    11/02/20 0625 11/03/20 0444 11/04/20 0633  PROT 6.3* 6.0* 6.2*  ALBUMIN 2.4* 2.4* 2.4*  AST 309* 79* 45*  ALT 551* 311* 201*  ALKPHOS 1,060* 908* 924*  BILITOT 5.4* 2.5* 2.4*   PT/INR Recent Labs    11/02/20 0625  LABPROT 15.2  INR 1.2    Assessment: 50 year old male s/p laparoscopic cholecystectomy 4/6 with empyema of gallbladder, bile leak and JP drain placed intraoperatively, undergoing ERCP on 4/7 with stent and developing post-ERCP pancreatitis. Presenting again this admission with early satiety, bump in LFTs, and CT with new  large complex fluid collection involving the pancreatic head, uncinate process and proximal pancreatic body, measuring up to 18 cm in greatest dimension, compatible with acute necrotic collection secondary to underlying necrotizing pancreatitis. Biliary stent in place but with new intrahepatic bile duct dilatation likely due to mass effect on the stent by pancreatic fluid collection. Significant mass effect on stomach and proximal small bowel by large pancreatic fluid collection.  Clinically, he has had improvement since this readmission, tolerating diet although noting early satiety and feelings of "tightness", which is secondary to large collection and mass effect. Overall, LFTs contniue to trend down with notable improvement in transaminases since admission. Alk phos remains elevated although improved from admission; bilirubin continues to slowly decline.  Will need tertiary referral, which we will expedite. I will reach out to GI at [facility] in hopes of expediting this.     Plan: Will refer to [place] Further recommendations to follow   Annitta Needs, PhD, ANP-BC [facility]    LOS: 3 days    11/04/2020, 8:26 AM

## 2020-11-04 NOTE — Progress Notes (Signed)
[facility] Progress Note     Subjective: Overall improving. Pain improving. Still tolerating diet but early satiety. Labs improving. JP drain yellow output.   Objective: Vital signs in last 24 hours: Temp:  [97.6 F (36.4 C)-97.9 F (36.6 C)] 97.9 F (36.6 C) (05/02 0524) Pulse Rate:  [83-98] 87 (05/02 0524) Resp:  [16] 16 (05/02 0524) BP: (133-145)/(76-89) 145/89 (05/02 0524) SpO2:  [94 %-99 %] 98 % (05/02 0524) Last BM Date: 11/03/20  Intake/Output from previous day: 05/01 0701 - 05/02 0700 In: 720 [P.O.:720] Out: 101 [Urine:1; Drains:100] Intake/Output this shift: Total I/O In: 220 [P.O.:220] Out: -   General appearance: alert, cooperative and no distress Resp: normal work of breathing GI: soft, mildly distended, JP with yellow output, some leaking around drain, port sites healing  Lab Results:  Recent Labs    11/03/20 0444 11/04/20 0633  WBC 11.7* 10.5  HGB 12.8* 12.1*  HCT 40.6 39.3  PLT 338 342   BMET Recent Labs    11/03/20 0444 11/04/20 0633  NA 136 138  K 3.7 4.2  CL 100 103  CO2 25 30  GLUCOSE 115* 124*  BUN 8 9  CREATININE 0.88 0.83  CALCIUM 8.1* 8.4*   PT/INR Recent Labs    11/02/20 0625  LABPROT 15.2  INR 1.2    Studies/Results: No results found.  Anti-infectives: Anti-infectives (From admission, onward)   Start     Dose/Rate Route Frequency Ordered Stop   11/01/20 2200  piperacillin-tazobactam (ZOSYN) IVPB 3.375 g  Status:  Discontinued        3.375 g 12.5 mL/hr over 240 Minutes Intravenous Every 8 hours 11/01/20 1335 11/01/20 1406   11/01/20 1500  meropenem (MERREM) 1 g in sodium chloride 0.9 % 100 mL IVPB        1 g 200 mL/hr over 30 Minutes Intravenous Every 8 hours 11/01/20 1413     11/01/20 1430  piperacillin-tazobactam (ZOSYN) IVPB 3.375 g  Status:  Discontinued        3.375 g 100 mL/hr over 30 Minutes Intravenous  Once 11/01/20 1353 11/01/20 1406      Assessment/Plan: Mr. Tindel is a very sweet  50 yo who had a laparoscopic cholecystectomy 4/6 with empyema of the gallbladder and torn infundibulum/ Cystic duct junction, bile leak and JP drain placed intraoperatively, ERCP 4/7 with stent and subsequent ERCP pancreatitis, did better post ERCP and discharged home 4/12 with improving labs. Seen in clinic 4/19 with normal labs and improving JP output. Seen in clinic 4/28 with leukocytosis and hyperbilirubinemia, CT 4/29 found acute pancreatic necrosis (~ 3 weeks out from pancreatitis from ERCP), 4/29 CT scan with large mass effect on biliary system and stent.  Improving over weekend with antibiotics and IVF. Tolerating a diet and having BMs. JP drain is yellow and not much higher in amount output.  Unsure if the biliary tree decompressing through JP drain some versus pancreatic necrosis not causing as much compression. Overall improved.    Dr. Gala Romney and I have discussed at length and plan for getting GI outpatient follow up ASAP with [facility]. I do not think he will need any inpatient hepatobiliary management at this time.   PRN for pain IS, OOB HD good Soft diet, low fat  JP drain and care Labs in AM Stopped IVFs  Meropenem empirically for pancreatic necrosis and risk of cholangitis, will decide about outpatient antibiotic, already has Augmentin filled and would be appropriate for cholangitis prophylaxis  SCDs, heparin sq  Discussed with GI team.   Of note- reticular rash on abdomen for 4+ months, biopsied during surgery and pathologist/ and dermatology pathologist looked at it and no concerning features reported. Pathology back as lipoma.  LOS: 3 days    Virl Cagey 11/04/2020

## 2020-11-05 ENCOUNTER — Encounter (HOSPITAL_COMMUNITY): Payer: Self-pay | Admitting: Radiology

## 2020-11-05 ENCOUNTER — Inpatient Hospital Stay (HOSPITAL_COMMUNITY): Payer: Medicaid Other

## 2020-11-05 DIAGNOSIS — K8309 Other cholangitis: Secondary | ICD-10-CM | POA: Diagnosis not present

## 2020-11-05 DIAGNOSIS — K8689 Other specified diseases of pancreas: Secondary | ICD-10-CM

## 2020-11-05 DIAGNOSIS — R7989 Other specified abnormal findings of blood chemistry: Secondary | ICD-10-CM

## 2020-11-05 DIAGNOSIS — T859XXA Unspecified complication of internal prosthetic device, implant and graft, initial encounter: Secondary | ICD-10-CM

## 2020-11-05 LAB — GLUCOSE, CAPILLARY: Glucose-Capillary: 111 mg/dL — ABNORMAL HIGH (ref 70–99)

## 2020-11-05 LAB — CBC WITH DIFFERENTIAL/PLATELET
Abs Immature Granulocytes: 0.1 10*3/uL — ABNORMAL HIGH (ref 0.00–0.07)
Basophils Absolute: 0.1 10*3/uL (ref 0.0–0.1)
Basophils Relative: 1 %
Eosinophils Absolute: 0.1 10*3/uL (ref 0.0–0.5)
Eosinophils Relative: 1 %
HCT: 38.7 % — ABNORMAL LOW (ref 39.0–52.0)
Hemoglobin: 12.4 g/dL — ABNORMAL LOW (ref 13.0–17.0)
Immature Granulocytes: 1 %
Lymphocytes Relative: 6 %
Lymphs Abs: 0.8 10*3/uL (ref 0.7–4.0)
MCH: 30.1 pg (ref 26.0–34.0)
MCHC: 32 g/dL (ref 30.0–36.0)
MCV: 93.9 fL (ref 80.0–100.0)
Monocytes Absolute: 0.8 10*3/uL (ref 0.1–1.0)
Monocytes Relative: 6 %
Neutro Abs: 11.6 10*3/uL — ABNORMAL HIGH (ref 1.7–7.7)
Neutrophils Relative %: 85 %
Platelets: 335 10*3/uL (ref 150–400)
RBC: 4.12 MIL/uL — ABNORMAL LOW (ref 4.22–5.81)
RDW: 13.9 % (ref 11.5–15.5)
WBC: 13.5 10*3/uL — ABNORMAL HIGH (ref 4.0–10.5)
nRBC: 0 % (ref 0.0–0.2)

## 2020-11-05 LAB — COMPREHENSIVE METABOLIC PANEL
ALT: 149 U/L — ABNORMAL HIGH (ref 0–44)
AST: 67 U/L — ABNORMAL HIGH (ref 15–41)
Albumin: 2.2 g/dL — ABNORMAL LOW (ref 3.5–5.0)
Alkaline Phosphatase: 1127 U/L — ABNORMAL HIGH (ref 38–126)
Anion gap: 9 (ref 5–15)
BUN: 15 mg/dL (ref 6–20)
CO2: 26 mmol/L (ref 22–32)
Calcium: 8.1 mg/dL — ABNORMAL LOW (ref 8.9–10.3)
Chloride: 98 mmol/L (ref 98–111)
Creatinine, Ser: 0.74 mg/dL (ref 0.61–1.24)
GFR, Estimated: >60 mL/min (ref >60)
Glucose, Bld: 128 mg/dL — ABNORMAL HIGH (ref 70–99)
Potassium: 3.7 mmol/L (ref 3.5–5.1)
Sodium: 133 mmol/L — ABNORMAL LOW (ref 135–145)
Total Bilirubin: 6.5 mg/dL — ABNORMAL HIGH (ref 0.3–1.2)
Total Protein: 6 g/dL — ABNORMAL LOW (ref 6.5–8.1)

## 2020-11-05 LAB — LIPASE, BLOOD: Lipase: 73 U/L — ABNORMAL HIGH (ref 11–51)

## 2020-11-05 LAB — LACTIC ACID, PLASMA: Lactic Acid, Venous: 1.2 mmol/L (ref 0.5–1.9)

## 2020-11-05 MED ORDER — LACTATED RINGERS IV SOLN: INTRAVENOUS | Status: AC

## 2020-11-05 MED ORDER — LACTATED RINGERS IV SOLN: INTRAVENOUS | Status: DC

## 2020-11-05 MED ORDER — LACTATED RINGERS IV BOLUS
1000.0000 mL | Freq: Once | INTRAVENOUS | Status: AC
  Administered 2020-11-05: 1000 mL via INTRAVENOUS

## 2020-11-05 MED ORDER — IOHEXOL 300 MG/ML  SOLN
100.0000 mL | Freq: Once | INTRAMUSCULAR | Status: AC | PRN
  Administered 2020-11-05: 100 mL via INTRAVENOUS

## 2020-11-05 MED ORDER — HEPARIN SODIUM (PORCINE) 5000 UNIT/ML IJ SOLN
5000.0000 [IU] | Freq: Three times a day (TID) | INTRAMUSCULAR | Status: AC
  Administered 2020-11-05: 5000 [IU] via SUBCUTANEOUS

## 2020-11-05 NOTE — Plan of Care (Signed)
  Problem: Education: Goal: Knowledge of General Education information will improve Description: Including pain rating scale, medication(s)/side effects and non-pharmacologic comfort measures Outcome: Progressing   Problem: Health Behavior/Discharge Planning: Goal: Ability to manage health-related needs will improve Outcome: Progressing   Problem: Clinical Measurements: Goal: Ability to maintain clinical measurements within normal limits will improve Outcome: Not Progressing Goal: Will remain free from infection Outcome: Not Progressing Goal: Diagnostic test results will improve Outcome: Not Progressing Goal: Respiratory complications will improve Outcome: Progressing Goal: Cardiovascular complication will be avoided Outcome: Progressing   Problem: Activity: Goal: Risk for activity intolerance will decrease Outcome: Progressing   Problem: Nutrition: Goal: Adequate nutrition will be maintained Outcome: Not Progressing   Problem: Coping: Goal: Level of anxiety will decrease Outcome: Progressing   Problem: Elimination: Goal: Will not experience complications related to bowel motility Outcome: Progressing Goal: Will not experience complications related to urinary retention Outcome: Progressing   Problem: Pain Managment: Goal: General experience of comfort will improve Outcome: Progressing   Problem: Safety: Goal: Ability to remain free from injury will improve Outcome: Progressing   Problem: Skin Integrity: Goal: Risk for impaired skin integrity will decrease Outcome: Progressing

## 2020-11-05 NOTE — Progress Notes (Signed)
Report called to Ivin Booty on 4E who will be accepting nurse

## 2020-11-05 NOTE — Progress Notes (Signed)
   11/05/20 1900  Assess: MEWS Score  Pulse Rate (!) 125  Assess: MEWS Score  MEWS Temp 0  MEWS Systolic 0  MEWS Pulse 2  MEWS RR 0  MEWS LOC 0  MEWS Score 2  MEWS Score Color Yellow  Assess: if the MEWS score is Yellow or Red  Were vital signs taken at a resting state? Yes  Focused Assessment Change from prior assessment (see assessment flowsheet)  Early Detection of Sepsis Score *See Row Information* Low  MEWS guidelines implemented *See Row Information* Yes  Treat  MEWS Interventions Other (Comment) (paged provider to notify)  Take Vital Signs  Increase Vital Sign Frequency  Yellow: Q 2hr X 2 then Q 4hr X 2, if remains yellow, continue Q 4hrs  Escalate  MEWS: Escalate Yellow: discuss with charge nurse/RN and consider discussing with provider and RRT  Notify: Charge Nurse/RN  Name of Charge Nurse/RN Notified Mable Fill, RN  Date Charge Nurse/RN Notified 11/05/20  Time Charge Nurse/RN Notified 1904  Notify: Provider  Provider Name/Title Dr. Jenetta Downer  Date Provider Notified 11/05/20  Time Provider Notified 1904  Notification Type Page  Notification Reason Change in status (HR sustaining in 120s)  Provider response Other (Comment) (awaiting response)

## 2020-11-05 NOTE — Progress Notes (Signed)
[facility]  LFTs and wbc back up. Fever overnight. Had been resolving spontaneously and no prior fevers. Will discuss with radiology MRCP versus CT pancreatic protocol and order this AM.  Notified Gi as he will likely need transfer ASAP.  LR @ 100 and NPO now. Updated patient and RN.   Curlene Labrum, MD [facility] [address] [place], [place] [id number] [phone] (office)

## 2020-11-05 NOTE — H&P (Addendum)
History and Physical    Steve Romero JXB:[id number] DOB: [birth year] DOA: 11/01/2020  PCP: [name], DO   Patient coming from: Home  I have personally briefly reviewed patient's old medical records in [place]  Chief Complaint:   HPI: Steve Romero is a 50 y.o. male with medical history significant for COPD, bipolar disorder, schizoaffective disorder and depression.  Patient was admitted 4/29 at [facility] under the general surgery service.   In the ED 3/15 diagnosed with cholelithiasis and discharged to follow-up as outpatient with general surgery, subsequently patient underwent laparoscopic cholecystectomy 4/6 with postop diagnosis of empyema of the gallbladder with a bile leak. ( He also had a reticular skin lesion/an abdominal wall mass, which was biopsied with pathology results showing underlying benign lipoma).  Patient was suspected to have a biliary leak,  had a JP drain placed, and GI was consulted for ERCP for diagnostic and therapeutic purposes. He underwent ERCP 4/7 by Dr. Laural Golden with stent placement.  He had postop ERCP pancreatitis but was ultimately discharged on 4/12. Patient followed up subsequently as outpatient, 4/28, patient's white count was 18, subsequent CT 4/29 showed necrotic area of the pancreatic head with some mass-effect on biliary system with increasing bilirubin.  Patient was hospitalized, placed on empiric meropenem.  Diet was restarted and lab work improved-with improving liver enzymes..  Plan was to get him transferred over the weekend, but with his improvement opted for outpatient referral. 5/2, patient developed a fever with worsening liver enzymes, worsening leukocytosis and bilirubin suggesting cholangitis. Repeat CT- 5/3-subacute necrotizing pancreatitis, large irregular necrotic fluid collection replacing much of the pancreatic head neck and body, mildly increased from prior study 4/29.  Stable position of biliary stents.  Moderate  diffuse intrahepatic biliary ductal dilatation similar to slightly worse from prior study.  Attempts to transfer [facility] to [facility] for advanced endoscopic assessment and manipulation of biliary stents was unsuccessful.  Dr. Constance Haw talked to Dr. Rush Landmark, who graciously accepted patient in transfer under hospitalist service.   Review of Systems: As per HPI all other systems reviewed and negative.  Past Medical History:  Diagnosis Date  . Anxiety 10-2014  . Bipolar disorder ([place]) age 44  . COPD (chronic obstructive pulmonary disease) ([place]) 2016  . Depression age 59  . Enlarged prostate   . Hyperlipidemia 2016  . Hypertension 2013  . Schizoaffective disorder ([place]) 11-13-14  . Sleep apnea   . Thyroid disease 11-2014    Past Surgical History:  Procedure Laterality Date  . BILIARY STENT PLACEMENT N/A 10/10/2020   Procedure: BILIARY STENT PLACEMENT;  Surgeon: Rogene Houston, MD;  Location: [facility];  Service: Endoscopy;  Laterality: N/A;  . CARPAL TUNNEL RELEASE Left 02/21/2016   Procedure: CARPAL TUNNEL RELEASE;  Surgeon: Carole Civil, MD;  Location: [facility];  Service: Orthopedics;  Laterality: Left;  . CHOLECYSTECTOMY N/A 10/09/2020   Procedure: LAPAROSCOPIC CHOLECYSTECTOMY;  Surgeon: Virl Cagey, MD;  Location: [facility];  Service: General;  Laterality: N/A;  . ERCP N/A 10/10/2020   Procedure: ENDOSCOPIC RETROGRADE CHOLANGIOPANCREATOGRAPHY (ERCP);  Surgeon: Rogene Houston, MD;  Location: [facility];  Service: Endoscopy;  Laterality: N/A;  . HERNIA REPAIR Left 2002   groin  . INGUINAL HERNIA REPAIR Left 04/05/2017   Procedure: RECURRENT HERNIA REPAIR INGUINAL ADULT WITH MESH;  Surgeon: [name], MD;  Location: [facility];  Service: General;  Laterality: Left;  Marland Kitchen MASS EXCISION Right 04/29/2020   Procedure: EXCISION MASS RIGHT WRIST;  Surgeon: [name], MD;  Location: [facility];  Service: Orthopedics;  Laterality: Right;  . MASS EXCISION Right  10/09/2020   Procedure: EXCISION MASS, ABDOMINAL WALL, 2CM;  Surgeon: Virl Cagey, MD;  Location: [facility];  Service: General;  Laterality: Right;  . SPHINCTEROTOMY N/A 10/10/2020   Procedure: SPHINCTEROTOMY;  Surgeon: Rogene Houston, MD;  Location: [facility];  Service: Endoscopy;  Laterality: N/A;     reports that he has been smoking cigarettes. He has a 6.50 pack-year smoking history. He has never used smokeless tobacco. He reports that he does not drink alcohol and does not use drugs.  Allergies  Allergen Reactions  . Desipramine Nausea Only and Rash    Family History  Problem Relation Age of Onset  . Diabetes Father   . Heart disease Father   . Heart disease Maternal Grandmother   . Stroke Maternal Grandfather     Prior to Admission medications   Medication Sig Start Date End Date Taking? Authorizing Provider  acetaminophen (TYLENOL) 325 MG tablet Take 650 mg by mouth every 6 (six) hours as needed.   Yes [provider]  amoxicillin-clavulanate (AUGMENTIN) 875-125 MG tablet Take 1 tablet by mouth 2 (two) times daily for 10 days. 10/31/20 11/10/20 Yes Virl Cagey, MD  cyclobenzaprine (FLEXERIL) 10 MG tablet Take 0.5 tablets (5 mg total) by mouth 3 (three) times daily as needed for muscle spasms. 10/15/20  Yes Virl Cagey, MD  docusate sodium (COLACE) 100 MG capsule Take 1 capsule (100 mg total) by mouth 2 (two) times daily as needed for mild constipation (while taking narcotics). 10/15/20  Yes Virl Cagey, MD  oxyCODONE (OXY IR/ROXICODONE) 5 MG immediate release tablet Take 1-2 tablets (5-10 mg total) by mouth every 4 (four) hours as needed for severe pain or breakthrough pain. 10/31/20  Yes Virl Cagey, MD  ondansetron (ZOFRAN ODT) 4 MG disintegrating tablet Take 1 tablet (4 mg total) by mouth every 8 (eight) hours as needed for nausea or vomiting. Patient not taking: Reported on 11/01/2020 10/15/20   Virl Cagey, MD  risperiDONE (RISPERDAL) 1 MG  tablet Take 1 tablet (1 mg total) by mouth at bedtime. Further fills to psychiatry Patient not taking: Reported on 11/01/2020 10/31/20   [name], DO  traZODone (DESYREL) 150 MG tablet Take 1-2 tablets (150-300 mg total) by mouth at bedtime. Further fills per psychiatry Patient not taking: Reported on 11/01/2020 10/31/20   [name], DO    Physical Exam: Vitals:   11/04/20 2115 11/05/20 0543 11/05/20 1135 11/05/20 1422  BP: (!) 154/65 129/80 (!) 142/91 137/82  Pulse: (!) 106 (!) 102 (!) 108 (!) 106  Resp:  17 20 16   Temp:  98.5 F (36.9 C) 99 F (37.2 C) 98 F (36.7 C)  TempSrc:  Oral Oral Oral  SpO2:  94% 100% 98%    Constitutional: Acutely ill-appearing, slightly icteric, lethargic Vitals:   11/04/20 2115 11/05/20 0543 11/05/20 1135 11/05/20 1422  BP: (!) 154/65 129/80 (!) 142/91 137/82  Pulse: (!) 106 (!) 102 (!) 108 (!) 106  Resp:  17 20 16   Temp:  98.5 F (36.9 C) 99 F (37.2 C) 98 F (36.7 C)  TempSrc:  Oral Oral Oral  SpO2:  94% 100% 98%   Eyes: PERRL,  ENMT: Mucous membranes are moist Neck: normal, supple, no masses, no thyromegaly Respiratory: clear to auscultation bilaterally, no wheezing, no crackles. Normal respiratory effort. No accessory muscle use.  Cardiovascular: Regular rate and rhythm, no murmurs / rubs / gallops. No extremity edema. 2+ pedal pulses.  Abdomen: Abdomen soft, tender, JP drain in place, draining about ~50cc serosanguineous fluid, no  Bowel sounds positive.  Musculoskeletal: no clubbing / cyanosis. No joint deformity upper and lower extremities. Good ROM, no contractures. Normal muscle tone.  Skin: no rashes, lesions, ulcers. No induration Neurologic: No apparent cranial nerve abnormalities, moving extremities spontaneously Psychiatric: Normal judgment and insight.  Lethargic and oriented x 3. Normal mood.   Labs on Admission: I have personally reviewed following labs and imaging studies  CBC: Recent Labs  Lab  11/02/20 0625 11/03/20 0444 11/04/20 0633 11/04/20 2035 11/05/20 0609  WBC 12.4* 11.7* 10.5 11.5* 13.5*  NEUTROABS 9.6* 8.6* 8.0* 9.7* 11.6*  HGB 13.2 12.8* 12.1* 12.2* 12.4*  HCT 41.8 40.6 39.3 38.4* 38.7*  MCV 95.0 94.2 96.3 94.3 93.9  PLT 390 338 342 335 952   Basic Metabolic Panel: Recent Labs  Lab 11/02/20 0625 11/03/20 0444 11/04/20 0633 11/04/20 2035 11/05/20 0609  NA 135 136 138 136 133*  K 4.2 3.7 4.2 4.3 3.7  CL 98 100 103 101 98  CO2 28 25 30 28 26   GLUCOSE 136* 115* 124* 135* 128*  BUN 12 8 9 12 15   CREATININE 0.90 0.88 0.83 0.94 0.74  CALCIUM 8.5* 8.1* 8.4* 8.1* 8.1*  MG 2.0  --   --   --   --   PHOS 3.8  --   --   --   --    Liver Function Tests: Recent Labs  Lab 11/02/20 0625 11/03/20 0444 11/04/20 0633 11/04/20 2035 11/05/20 0609  AST 309* 79* 45* 60* 67*  ALT 551* 311* 201* 164* 149*  ALKPHOS 1,060* 908* 924* 1,045* 1,127*  BILITOT 5.4* 2.5* 2.4* 3.9* 6.5*  PROT 6.3* 6.0* 6.2* 6.0* 6.0*  ALBUMIN 2.4* 2.4* 2.4* 2.2* 2.2*   Recent Labs  Lab 10/31/20 1612 11/02/20 0625 11/03/20 0444 11/04/20 0633 11/05/20 0609  LIPASE 97* 108* 100* 74* 73*   No results for input(s): AMMONIA in the last 168 hours. Coagulation Profile: Recent Labs  Lab 11/02/20 0625  INR 1.2   Radiological Exams on Admission: CT PELVIS W CONTRAST  Result Date: 11/05/2020 CLINICAL DATA:  Inpatient. Laparoscopic cholecystectomy for gallbladder empyema 10/09/2020 with postoperative bile leak treated with biliary stent placement 84/13/2440, complicated by necrotizing pancreatitis. Fever. Right upper quadrant abdominal pain. EXAM: CT ABDOMEN AND PELVIS WITHOUT AND WITH CONTRAST TECHNIQUE: Multidetector CT imaging of the abdomen and pelvis was performed following the standard protocol before and following the bolus administration of intravenous contrast. CONTRAST:  125mL OMNIPAQUE IOHEXOL 300 MG/ML  SOLN COMPARISON:  11/01/2020 CT abdomen/pelvis. FINDINGS: Lower chest: Moderate  platelike bibasilar atelectasis. Hepatobiliary: Normal liver size. No liver masses. Cholecystectomy. Percutaneous right sided surgical drain terminates in the anterior cholecystectomy bed. Mild-to-moderate diffuse intrahepatic biliary ductal dilatation appears similar to slightly worsened since 11/01/2020 CT. Stable position of biliary stent extending from the proximal common bile duct into the descending duodenal lumen. Somewhat abrupt caliber transition of the extrahepatic bile ducts just below the proximal tip of the CBD stent, unchanged. No radiopaque choledocholithiasis. No pneumobilia. Pancreas: Marked diffuse peripancreatic fat stranding and ill-defined fluid with diffuse thickening of the pancreatic parenchyma, compatible with subacute severe pancreatitis, not substantially changed. Large irregular fluid collection replacing much of the pancreatic head, neck and body measuring up to 15.4 x 9.6 x 8.2 cm (series 4/image 65), previously 14.4 x 9.5 x 7.1 cm  on 11/01/2020 CT using similar measurement technique, mildly increased, with associated mass-effect on the intrapancreatic portion of the common bile duct. Additional poorly defined inflammatory fluid collection in transverse mesocolon measuring 6.7 x 5.5 cm (series 4/image 70), previously 5.9 x 5.1 cm using similar measurement technique, mildly increased. Spleen: Normal size. No mass. Adrenals/Urinary Tract: Normal adrenals. No hydronephrosis. No renal masses. No renal stones. Normal bladder. Stomach/Bowel: Stomach is nondistended. Persistent reactive wall thickening in the gastric antrum and throughout the duodenum, not substantially changed. Normal caliber small bowel with no small bowel wall thickening. Gas is seen within the appendix, located adjacent to the inferior right liver, with surrounding inflammatory fluid, likely reactive. Similar reactive wall thickening throughout the hepatic and splenic flexures of the colon and transverse colon. No  significant colonic diverticulosis. Vascular/Lymphatic: Normal caliber abdominal aorta. Patent splenic, renal and hepatic veins. Similar extrinsic mass-effect on the main portal vein and proximal right and left portal veins, which remain patent. No pathologically enlarged lymph nodes in the abdomen or pelvis. Reproductive: Normal size prostate. Other: No pneumoperitoneum, ascites or focal fluid collection. Musculoskeletal: No aggressive appearing focal osseous lesions. Moderate lumbar spondylosis. IMPRESSION: 1. Subacute necrotizing pancreatitis. Large irregular necrotic fluid collection replacing much of the pancreatic head, neck and body, mildly increased in size since 11/01/2020 CT, with associated mass-effect on the intrapancreatic portion of the common bile duct. Additional poorly defined inflammatory fluid collection in the transverse mesocolon is mildly increased. 2. Stable position of biliary stent. Mild-to-moderate diffuse intrahepatic biliary ductal dilatation appears similar to slightly worsened since 11/01/2020 CT. 3. Stable reactive wall thickening in the gastric antrum, duodenum, hepatic and splenic flexures of the colon and transverse colon. 4. Moderate platelike bibasilar atelectasis. Electronically Signed   By: Ilona Sorrel M.D.   On: 11/05/2020 13:02   CT ABDOMEN W WO CONTRAST  Result Date: 11/05/2020 CLINICAL DATA:  Inpatient. Laparoscopic cholecystectomy for gallbladder empyema 10/09/2020 with postoperative bile leak treated with biliary stent placement 18/84/1660, complicated by necrotizing pancreatitis. Fever. Right upper quadrant abdominal pain. EXAM: CT ABDOMEN AND PELVIS WITHOUT AND WITH CONTRAST TECHNIQUE: Multidetector CT imaging of the abdomen and pelvis was performed following the standard protocol before and following the bolus administration of intravenous contrast. CONTRAST:  110mL OMNIPAQUE IOHEXOL 300 MG/ML  SOLN COMPARISON:  11/01/2020 CT abdomen/pelvis. FINDINGS: Lower chest:  Moderate platelike bibasilar atelectasis. Hepatobiliary: Normal liver size. No liver masses. Cholecystectomy. Percutaneous right sided surgical drain terminates in the anterior cholecystectomy bed. Mild-to-moderate diffuse intrahepatic biliary ductal dilatation appears similar to slightly worsened since 11/01/2020 CT. Stable position of biliary stent extending from the proximal common bile duct into the descending duodenal lumen. Somewhat abrupt caliber transition of the extrahepatic bile ducts just below the proximal tip of the CBD stent, unchanged. No radiopaque choledocholithiasis. No pneumobilia. Pancreas: Marked diffuse peripancreatic fat stranding and ill-defined fluid with diffuse thickening of the pancreatic parenchyma, compatible with subacute severe pancreatitis, not substantially changed. Large irregular fluid collection replacing much of the pancreatic head, neck and body measuring up to 15.4 x 9.6 x 8.2 cm (series 4/image 65), previously 14.4 x 9.5 x 7.1 cm on 11/01/2020 CT using similar measurement technique, mildly increased, with associated mass-effect on the intrapancreatic portion of the common bile duct. Additional poorly defined inflammatory fluid collection in transverse mesocolon measuring 6.7 x 5.5 cm (series 4/image 70), previously 5.9 x 5.1 cm using similar measurement technique, mildly increased. Spleen: Normal size. No mass. Adrenals/Urinary Tract: Normal adrenals. No hydronephrosis. No renal masses. No renal stones.  Normal bladder. Stomach/Bowel: Stomach is nondistended. Persistent reactive wall thickening in the gastric antrum and throughout the duodenum, not substantially changed. Normal caliber small bowel with no small bowel wall thickening. Gas is seen within the appendix, located adjacent to the inferior right liver, with surrounding inflammatory fluid, likely reactive. Similar reactive wall thickening throughout the hepatic and splenic flexures of the colon and transverse colon. No  significant colonic diverticulosis. Vascular/Lymphatic: Normal caliber abdominal aorta. Patent splenic, renal and hepatic veins. Similar extrinsic mass-effect on the main portal vein and proximal right and left portal veins, which remain patent. No pathologically enlarged lymph nodes in the abdomen or pelvis. Reproductive: Normal size prostate. Other: No pneumoperitoneum, ascites or focal fluid collection. Musculoskeletal: No aggressive appearing focal osseous lesions. Moderate lumbar spondylosis. IMPRESSION: 1. Subacute necrotizing pancreatitis. Large irregular necrotic fluid collection replacing much of the pancreatic head, neck and body, mildly increased in size since 11/01/2020 CT, with associated mass-effect on the intrapancreatic portion of the common bile duct. Additional poorly defined inflammatory fluid collection in the transverse mesocolon is mildly increased. 2. Stable position of biliary stent. Mild-to-moderate diffuse intrahepatic biliary ductal dilatation appears similar to slightly worsened since 11/01/2020 CT. 3. Stable reactive wall thickening in the gastric antrum, duodenum, hepatic and splenic flexures of the colon and transverse colon. 4. Moderate platelike bibasilar atelectasis. Electronically Signed   By: Ilona Sorrel M.D.   On: 11/05/2020 13:02   DG Chest Port 1 View  Result Date: 11/04/2020 CLINICAL DATA:  Fever EXAM: PORTABLE CHEST 1 VIEW COMPARISON:  Report 11/20/2014, CT 11/01/2020 FINDINGS: Subsegmental atelectasis at the bases. No pleural effusion. Stable cardiomediastinal silhouette with prominent mediastinal fat noted on CT. No pneumothorax. IMPRESSION: Subsegmental atelectasis at the bases. Electronically Signed   By: Donavan Foil M.D.   On: 11/04/2020 18:49    EKG: Sinus tachycardia 124, QTc 456.  Nonspecific T wave changes to 1 aVF, V4 V5 and V6.  Assessment/Plan Principal Problem:   Disorder of bile duct stent Active Problems:   Pancreatic necrosis   Elevated LFTs    Ascending cholangitis   Pancreatic necrosis/bile duct stent disorder/ascending cholangitis-tachycardic to 125, tmax today 99, , leukocytosis 13.5.  Elevated liver enzymes, AST 67, ALT 149, ALP 1127. (Please see HPI for details) - C T abdomen and pelvis 5/3-subacute necrotizing pancreatitis, large irregular necrotic fluid collection replacing much of the pancreatic head neck and body, mildly increased from prior study 4/29.  Stable position of biliary stents.  Moderate diffuse intrahepatic biliary ductal dilatation similar to slightly worse from prior study. -General surgery talked to Dr. Rush Landmark, patient to be transferred to [facility] for possible advanced endoscopic ultrasound.  - JP drain in place -CBC, CMP in the morning -N.p.o. - Aggressive hydration, 1 L bolus now, increase R/L to 200c/hr x 12hrs -Continue IV meropenem -IV Protonix 40 daily -Will insert Foley, patient is critically ill - Q4h cbg - check lactic acid- 1.2  COPD-stable.  Schizoaffective disorder, depression -N.p.o. for now, hold risperidone.   DVT prophylaxis: heparin Code Status: Full code Family Communication: None at bedside Disposition Plan: > 2 days Consults called: GI Admission status: Inpatient, stepdown. I certify that at the point of admission it is my clinical judgment that the patient will require inpatient hospital care spanning beyond 2 midnights from the point of admission due to high intensity of service, high risk for further deterioration and high frequency of surveillance required.    Bethena Roys MD [facility]  11/05/2020, 8:09 PM

## 2020-11-05 NOTE — Progress Notes (Signed)
[facility]  Talked to [facility] is not accepting ANY Transfers right now and are holding 30 in their ED right now. They do not even have a Waitlist.  Trying to figure out next best plan.   Curlene Labrum, MD [facility] [address] [place], [place] [address] [phone] (office)

## 2020-11-05 NOTE — Progress Notes (Signed)
[facility]. No beds and not accepting. Per the Transfer line after they discussed with physician.  Curlene Labrum, MD [facility] [address] [place], [place] [address] [phone] (office)

## 2020-11-05 NOTE — Progress Notes (Signed)
[facility]  Have discussed case with Dr. Laurence Ferrari IR as last resort if need biliary decompression via PTC.  Dr. Jenetta Downer reaching out to Dr. Rush Landmark and I also sent a message over Epic.  Awaiting to here if there is any possibility of endoscopic care at [facility].  Curlene Labrum, MD [facility] [address] [place], [place] [address] [phone] (office)

## 2020-11-05 NOTE — Progress Notes (Signed)
[facility]  [facility] on diversion and no beds and waitlist is full.  Dr. Jenetta Downer going to talk to GI at [facility].   Curlene Labrum, Md

## 2020-11-05 NOTE — Progress Notes (Signed)
Subjective: Epigastric and RUQ abdominal pain seem to be increasing.  Currently, pain is about 5/10 in severity.  Last received oxycodone around 12:30 AM, and received Tylenol around 6:30 AM.  Denies nausea or vomiting.  Tolerated his diet well yesterday morning and at lunchtime, but around dinner, he began to lose his appetite and started feeling a little worse.  Denies confusion/change in mental status.  He had 2 good bowel movements this morning.  Total of 280 ml output from JP drain yesterday.   Objective: Vital signs in last 24 hours: Temp:  [98.2 F (36.8 C)-101.5 F (38.6 C)] 99.2 F (37.3 C) (05/02 2114) Pulse Rate:  [101-115] 101 (05/02 2114) Resp:  [17-28] 17 (05/02 2114) BP: (120-144)/(77-85) 124/77 (05/02 2114) SpO2:  [93 %-98 %] 98 % (05/02 2114) Last BM Date: 10/31/20 General:   Alert and oriented, pleasant, no acute distress.  Jaundiced. Head:  Normocephalic and atraumatic. Eyes: With scleral icterus. Abdomen:  Bowel sounds present.  Abdomen is distended but soft.  Moderate to severe tenderness to palpation in the epigastric and RUQ region.  Voluntary guarding.  No rebound.  JP drain in place with serous drainage. Msk:  Symmetrical without gross deformities. Normal posture. Extremities:  With 1+ bilateral lower extremity edema. Neurologic:  Alert and  oriented x4;  grossly normal neurologically. Skin:  Warm and dry, intact without significant lesions.  Psych: Normal mood and affect.  Intake/Output from previous day: 05/02 0701 - 05/03 0700 In: 220 [P.O.:220] Out: 280 [Drains:280] Intake/Output this shift: No intake/output data recorded.  Lab Results: Recent Labs    11/04/20 0633 11/04/20 2035 11/05/20 0609  WBC 10.5 11.5* 13.5*  HGB 12.1* 12.2* 12.4*  HCT 39.3 38.4* 38.7*  PLT 342 335 335   BMET Recent Labs    11/04/20 0633 11/04/20 2035 11/05/20 0609  NA 138 136 133*  K 4.2 4.3 3.7  CL 103 101 98  CO2 30 28 26   GLUCOSE 124* 135* 128*  BUN  9 12 15   CREATININE 0.83 0.94 0.74  CALCIUM 8.4* 8.1* 8.1*   LFT Recent Labs    11/04/20 0633 11/04/20 2035 11/05/20 0609  PROT 6.2* 6.0* 6.0*  ALBUMIN 2.4* 2.2* 2.2*  AST 45* 60* 67*  ALT 201* 164* 149*  ALKPHOS 924* 1,045* 1,127*  BILITOT 2.4* 3.9* 6.5*   Studies/Results: DG Chest Port 1 View  Result Date: 11/04/2020 CLINICAL DATA:  Fever EXAM: PORTABLE CHEST 1 VIEW COMPARISON:  Report 11/20/2014, CT 11/01/2020 FINDINGS: Subsegmental atelectasis at the bases. No pleural effusion. Stable cardiomediastinal silhouette with prominent mediastinal fat noted on CT. No pneumothorax. IMPRESSION: Subsegmental atelectasis at the bases. Electronically Signed   By: Donavan Foil M.D.   On: 11/04/2020 18:49    Assessment: 50 year old male s/p laparoscopic cholecystectomy 4/6 with empyema of gallbladder, bile leak and JP drain placed intraoperatively, undergoing ERCP on 4/7 with stent and developing post-ERCP pancreatitis. 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 Biliary stent in place but with new intrahepatic bile duct dilatation likely due to mass effect on the stent by pancreatic fluid collection. Significant mass effect on stomach and proximal small bowel by large pancreatic fluid collection.  Clinically, patient has been improving spontaneously with supportive measures/antibiotics, LFTs improving, and his diet was being advanced.  However, yesterday evening, patient began to feel poorly.  He also became febrile and tachycardic.  This morning, WBC increased back up to 13.5, total bilirubin increased to 6.5, AST 67, ALT 149, alk phos 1127.  Lipase 73. Picture is now more consistent with cholangitis.  Fever today has  improved with tylenol. Remains tachy this morning. No changes in mental status or hypotension. JP drain output continues to increase with total of 280 mL yesterday.   We had arranged for outpatient follow-up with [facility] on 5/10; however, as patient is clinically worsening, anticipate transfer to [place].  Dr. Constance Haw has ordered repeat CT A/P with pancreatic protocol.  She is also started LR at 100 cc/h and made patient NPO.  Plan: 1.  Anticipate transfer to [facility] ASAP. 2.  Follow-up on CT A/P with pancreatic protocol this morning. 3.  Continue IV antibiotics. 4.  NPO. 5.  Continue IV fluids and other supportive measures.    LOS: 4 days    11/05/2020, 8:00 AM   Aliene Altes, [facility]

## 2020-11-05 NOTE — Progress Notes (Addendum)
[facility] Progress/Transfer Note     Subjective: Mr. Steve Romero is a 50 yo who had a laparoscopic cholecystectomy on 4/6 with an empyema of the gallbladder with a bile leak from the cystic duct tearing and operative JP drain placement. He had an ERCP on 4/7 with Dr. Laural Romero with stent placement. He had post ERCP pancreatitis and was ultimately discharged home on 4/12. He followed up in clinic 4/19 with lab work and his symptoms and labs were improving, JP drain was serous in nature.  He saw Steve Norlander, DO and myself on 4/28 and had a CBC with Steve Romero M, DO showing a WBC to 18. I proceeded to add a CMP and CT scan. CT on 4/29 he had a necrotic area of his pancreatic head that was creating some mass effect on his biliary system and his bilirubin was up on his labs. Clinically he was well and had no fevers or chills. He was brought into the hospital placed on empiric Meropenem given the pancreatic necrosis and risk of cholangitis and was monitored. His diet was restarted and his lab work improved.  We had discussed trying to get him transferred over the weekend but with his improvement opted for outpatient referral.   He unfortunately got sick on 11/04/2020 with a fever and lab work was trending up. This AM he has a worsening leukocytosis, fevers, bilirubin all signs of cholangitis.  Repeat CT w/o and w contrast a/p pancreatic protocol demonstrates slightly larger area of necrosis with wall and becoming more formed, now about 3.5 weeks out from his ERCP pancreatitis.  Biliary system with dilation in the intrahepatic and extrahepatic ducts from mass effect.  I tried to get him transferred to [facility] [place] an [facility] for advanced endoscopic assessment and manipulation of the biliary stent but they were all full.   I was unaware that Steve Romero was able to do advanced EUS with cyst-gastrostomy etc and he has graciously accepted to see the patient and help with his care once  he is transferred to [facility]. I have asked the hospitalist to help me get him transferred to the medical service as there is no surgical intervention needed at this time and more intervention from GI.    I will continue to follow the patient as an outpatient and appreciate everyone's assistance.   Objective: Vital signs in last 24 hours: Temp:  [98 F (36.7 C)-101.5 F (38.6 C)] 98 F (36.7 C) (05/03 1422) Pulse Rate:  [101-115] 106 (05/03 1422) Resp:  [16-28] 16 (05/03 1422) BP: (124-154)/(65-91) 137/82 (05/03 1422) SpO2:  [93 %-100 %] 98 % (05/03 1422) Last BM Date: 10/31/20  Intake/Output from previous day: 05/02 0701 - 05/03 0700 In: 220 [P.O.:220] Out: 280 [Drains:280] Intake/Output this shift: Total I/O In: 0  Out: 70 [Drains:70]  General appearance: alert, cooperative, no distress and jaundice Resp: normal work of breathing GI: soft, distended, JP with serous with some suds, port sites c/d/i biopsy site with minor scab no erythema or drainage  Lab Results:  Recent Labs    11/04/20 2035 11/05/20 0609  WBC 11.5* 13.5*  HGB 12.2* 12.4*  HCT 38.4* 38.7*  PLT 335 335   BMET Recent Labs    11/04/20 2035 11/05/20 0609  NA 136 133*  K 4.3 3.7  CL 101 98  CO2 28 26  GLUCOSE 135* 128*  BUN 12 15  CREATININE 0.94 0.74  CALCIUM 8.1* 8.1*    Studies/Results: CT PELVIS W CONTRAST  Result Date: 11/05/2020 CLINICAL DATA:  Inpatient. Laparoscopic cholecystectomy for gallbladder empyema 10/09/2020 with postoperative bile leak treated with biliary stent placement 40/04/2724, complicated by necrotizing pancreatitis. Fever. Right upper quadrant abdominal pain. EXAM: CT ABDOMEN AND PELVIS WITHOUT AND WITH CONTRAST TECHNIQUE: Multidetector CT imaging of the abdomen and pelvis was performed following the standard protocol before and following the bolus administration of intravenous contrast. CONTRAST:  155mL OMNIPAQUE IOHEXOL 300 MG/ML  SOLN COMPARISON:  11/01/2020 CT  abdomen/pelvis. FINDINGS: Lower chest: Moderate platelike bibasilar atelectasis. Hepatobiliary: Normal liver size. No liver masses. Cholecystectomy. Percutaneous right sided surgical drain terminates in the anterior cholecystectomy bed. Mild-to-moderate diffuse intrahepatic biliary ductal dilatation appears similar to slightly worsened since 11/01/2020 CT. Stable position of biliary stent extending from the proximal common bile duct into the descending duodenal lumen. Somewhat abrupt caliber transition of the extrahepatic bile ducts just below the proximal tip of the CBD stent, unchanged. No radiopaque choledocholithiasis. No pneumobilia. Pancreas: Marked diffuse peripancreatic fat stranding and ill-defined fluid with diffuse thickening of the pancreatic parenchyma, compatible with subacute severe pancreatitis, not substantially changed. Large irregular fluid collection replacing much of the pancreatic head, neck and body measuring up to 15.4 x 9.6 x 8.2 cm (series 4/image 65), previously 14.4 x 9.5 x 7.1 cm on 11/01/2020 CT using similar measurement technique, mildly increased, with associated mass-effect on the intrapancreatic portion of the common bile duct. Additional poorly defined inflammatory fluid collection in transverse mesocolon measuring 6.7 x 5.5 cm (series 4/image 70), previously 5.9 x 5.1 cm using similar measurement technique, mildly increased. Spleen: Normal size. No mass. Adrenals/Urinary Tract: Normal adrenals. No hydronephrosis. No renal masses. No renal stones. Normal bladder. Stomach/Bowel: Stomach is nondistended. Persistent reactive wall thickening in the gastric antrum and throughout the duodenum, not substantially changed. Normal caliber small bowel with no small bowel wall thickening. Gas is seen within the appendix, located adjacent to the inferior right liver, with surrounding inflammatory fluid, likely reactive. Similar reactive wall thickening throughout the hepatic and splenic  flexures of the colon and transverse colon. No significant colonic diverticulosis. Vascular/Lymphatic: Normal caliber abdominal aorta. Patent splenic, renal and hepatic veins. Similar extrinsic mass-effect on the main portal vein and proximal right and left portal veins, which remain patent. No pathologically enlarged lymph nodes in the abdomen or pelvis. Reproductive: Normal size prostate. Other: No pneumoperitoneum, ascites or focal fluid collection. Musculoskeletal: No aggressive appearing focal osseous lesions. Moderate lumbar spondylosis. IMPRESSION: 1. Subacute necrotizing pancreatitis. Large irregular necrotic fluid collection replacing much of the pancreatic head, neck and body, mildly increased in size since 11/01/2020 CT, with associated mass-effect on the intrapancreatic portion of the common bile duct. Additional poorly defined inflammatory fluid collection in the transverse mesocolon is mildly increased. 2. Stable position of biliary stent. Mild-to-moderate diffuse intrahepatic biliary ductal dilatation appears similar to slightly worsened since 11/01/2020 CT. 3. Stable reactive wall thickening in the gastric antrum, duodenum, hepatic and splenic flexures of the colon and transverse colon. 4. Moderate platelike bibasilar atelectasis. Electronically Signed   By: Ilona Sorrel Romero.D.   On: 11/05/2020 13:02   CT ABDOMEN W WO CONTRAST  Result Date: 11/05/2020 CLINICAL DATA:  Inpatient. Laparoscopic cholecystectomy for gallbladder empyema 10/09/2020 with postoperative bile leak treated with biliary stent placement 36/64/4034, complicated by necrotizing pancreatitis. Fever. Right upper quadrant abdominal pain. EXAM: CT ABDOMEN AND PELVIS WITHOUT AND WITH CONTRAST TECHNIQUE: Multidetector CT imaging of the abdomen and pelvis was performed following the standard protocol before and following the bolus administration of intravenous contrast.  CONTRAST:  11mL OMNIPAQUE IOHEXOL 300 MG/ML  SOLN COMPARISON:   11/01/2020 CT abdomen/pelvis. FINDINGS: Lower chest: Moderate platelike bibasilar atelectasis. Hepatobiliary: Normal liver size. No liver masses. Cholecystectomy. Percutaneous right sided surgical drain terminates in the anterior cholecystectomy bed. Mild-to-moderate diffuse intrahepatic biliary ductal dilatation appears similar to slightly worsened since 11/01/2020 CT. Stable position of biliary stent extending from the proximal common bile duct into the descending duodenal lumen. Somewhat abrupt caliber transition of the extrahepatic bile ducts just below the proximal tip of the CBD stent, unchanged. No radiopaque choledocholithiasis. No pneumobilia. Pancreas: Marked diffuse peripancreatic fat stranding and ill-defined fluid with diffuse thickening of the pancreatic parenchyma, compatible with subacute severe pancreatitis, not substantially changed. Large irregular fluid collection replacing much of the pancreatic head, neck and body measuring up to 15.4 x 9.6 x 8.2 cm (series 4/image 65), previously 14.4 x 9.5 x 7.1 cm on 11/01/2020 CT using similar measurement technique, mildly increased, with associated mass-effect on the intrapancreatic portion of the common bile duct. Additional poorly defined inflammatory fluid collection in transverse mesocolon measuring 6.7 x 5.5 cm (series 4/image 70), previously 5.9 x 5.1 cm using similar measurement technique, mildly increased. Spleen: Normal size. No mass. Adrenals/Urinary Tract: Normal adrenals. No hydronephrosis. No renal masses. No renal stones. Normal bladder. Stomach/Bowel: Stomach is nondistended. Persistent reactive wall thickening in the gastric antrum and throughout the duodenum, not substantially changed. Normal caliber small bowel with no small bowel wall thickening. Gas is seen within the appendix, located adjacent to the inferior right liver, with surrounding inflammatory fluid, likely reactive. Similar reactive wall thickening throughout the hepatic and  splenic flexures of the colon and transverse colon. No significant colonic diverticulosis. Vascular/Lymphatic: Normal caliber abdominal aorta. Patent splenic, renal and hepatic veins. Similar extrinsic mass-effect on the main portal vein and proximal right and left portal veins, which remain patent. No pathologically enlarged lymph nodes in the abdomen or pelvis. Reproductive: Normal size prostate. Other: No pneumoperitoneum, ascites or focal fluid collection. Musculoskeletal: No aggressive appearing focal osseous lesions. Moderate lumbar spondylosis. IMPRESSION: 1. Subacute necrotizing pancreatitis. Large irregular necrotic fluid collection replacing much of the pancreatic head, neck and body, mildly increased in size since 11/01/2020 CT, with associated mass-effect on the intrapancreatic portion of the common bile duct. Additional poorly defined inflammatory fluid collection in the transverse mesocolon is mildly increased. 2. Stable position of biliary stent. Mild-to-moderate diffuse intrahepatic biliary ductal dilatation appears similar to slightly worsened since 11/01/2020 CT. 3. Stable reactive wall thickening in the gastric antrum, duodenum, hepatic and splenic flexures of the colon and transverse colon. 4. Moderate platelike bibasilar atelectasis. Electronically Signed   By: Ilona Sorrel Romero.D.   On: 11/05/2020 13:02   DG Chest Port 1 View  Result Date: 11/04/2020 CLINICAL DATA:  Fever EXAM: PORTABLE CHEST 1 VIEW COMPARISON:  Report 11/20/2014, CT 11/01/2020 FINDINGS: Subsegmental atelectasis at the bases. No pleural effusion. Stable cardiomediastinal silhouette with prominent mediastinal fat noted on CT. No pneumothorax. IMPRESSION: Subsegmental atelectasis at the bases. Electronically Signed   By: Donavan Foil Romero.D.   On: 11/04/2020 18:49    Anti-infectives: Anti-infectives (From admission, onward)   Start     Dose/Rate Route Frequency Ordered Stop   11/01/20 2200  piperacillin-tazobactam (ZOSYN)  IVPB 3.375 g  Status:  Discontinued        3.375 g 12.5 mL/hr over 240 Minutes Intravenous Every 8 hours 11/01/20 1335 11/01/20 1406   11/01/20 1500  meropenem (MERREM) 1 g in sodium chloride 0.9 % 100 mL  IVPB        1 g 200 mL/hr over 30 Minutes Intravenous Every 8 hours 11/01/20 1413     11/01/20 1430  piperacillin-tazobactam (ZOSYN) IVPB 3.375 g  Status:  Discontinued        3.375 g 100 mL/hr over 30 Minutes Intravenous  Once 11/01/20 1353 11/01/20 1406      Assessment/Plan: Mr. Abbs is a very sweet 50 yo with cholangitis, biliary stent occlusion, acute necrotizing pancreatitis/ wall off necrosis who had a laparoscopic cholecystectomy 4/6 with empyema of the gallbladder and torn infundibulum/ Cystic duct junction, bile leak and JP drain placed intraoperatively, ERCP 4/7 with stent and subsequent ERCP pancreatitis, did better post ERCP and discharged home 4/12 with improving labs. Seen in clinic 4/19 with normal labs and improving JP output. Seen in clinic 4/28 with leukocytosis and hyperbilirubinemia, CT 4/29 found acute pancreatic necrosis (~ 3 weeks out from pancreatitis from ERCP), 4/29 CT scan with large mass effect on biliary system and stent.  Improved over weekend but the febrile yesterday. Worsening labs, jaundice. Needs urgent manipulation of the biliary system.   Steve Romero has agreed to help with this complex patient, and it is much appreciated.   PRN for pain IS, OOB Some tachycardia  NPO JP drain and care Labs in AM LR @100  Meropenem for cholangitis/ necrotizing pancreatitis  SCDs, heparin sqtonight but hold in AM for potential EUS  Discussed with Dr. Jenetta Downer, Steve Romero, Dr. Denton Brick who has helped with transfer.   Updated father and mother. Updated sister Steve Romero.  AC aware of patient transfer and helping prioritize.   Of note- reticular rash on abdomen for 4+ months, biopsied during surgery and pathologist/ and dermatology pathologist looked at it and no  concerning features reported. Pathology back as lipoma.    LOS: 4 days    Steve Romero 11/05/2020

## 2020-11-05 NOTE — Progress Notes (Signed)
[facility]  Placed a call to [facility] to see if they have availability.   Curlene Labrum, MD [facility] [address] [place], [place] [address] [phone] (office)

## 2020-11-06 ENCOUNTER — Telehealth: Payer: Self-pay | Admitting: Gastroenterology

## 2020-11-06 DIAGNOSIS — T859XXD Unspecified complication of internal prosthetic device, implant and graft, subsequent encounter: Secondary | ICD-10-CM

## 2020-11-06 DIAGNOSIS — K862 Cyst of pancreas: Secondary | ICD-10-CM | POA: Diagnosis not present

## 2020-11-06 DIAGNOSIS — K8309 Other cholangitis: Secondary | ICD-10-CM | POA: Diagnosis not present

## 2020-11-06 DIAGNOSIS — R7989 Other specified abnormal findings of blood chemistry: Secondary | ICD-10-CM | POA: Diagnosis not present

## 2020-11-06 DIAGNOSIS — K8689 Other specified diseases of pancreas: Secondary | ICD-10-CM | POA: Diagnosis not present

## 2020-11-06 LAB — CBC WITH DIFFERENTIAL/PLATELET
Abs Immature Granulocytes: 0.07 10*3/uL (ref 0.00–0.07)
Basophils Absolute: 0.1 10*3/uL (ref 0.0–0.1)
Basophils Relative: 1 %
Eosinophils Absolute: 0.1 10*3/uL (ref 0.0–0.5)
Eosinophils Relative: 0 %
HCT: 35.8 % — ABNORMAL LOW (ref 39.0–52.0)
Hemoglobin: 11.8 g/dL — ABNORMAL LOW (ref 13.0–17.0)
Immature Granulocytes: 1 %
Lymphocytes Relative: 6 %
Lymphs Abs: 0.8 10*3/uL (ref 0.7–4.0)
MCH: 29.9 pg (ref 26.0–34.0)
MCHC: 33 g/dL (ref 30.0–36.0)
MCV: 90.9 fL (ref 80.0–100.0)
Monocytes Absolute: 0.9 10*3/uL (ref 0.1–1.0)
Monocytes Relative: 7 %
Neutro Abs: 10.9 10*3/uL — ABNORMAL HIGH (ref 1.7–7.7)
Neutrophils Relative %: 85 %
Platelets: 328 10*3/uL (ref 150–400)
RBC: 3.94 MIL/uL — ABNORMAL LOW (ref 4.22–5.81)
RDW: 14.2 % (ref 11.5–15.5)
WBC: 12.8 10*3/uL — ABNORMAL HIGH (ref 4.0–10.5)
nRBC: 0 % (ref 0.0–0.2)

## 2020-11-06 LAB — COMPREHENSIVE METABOLIC PANEL
ALT: 111 U/L — ABNORMAL HIGH (ref 0–44)
AST: 60 U/L — ABNORMAL HIGH (ref 15–41)
Albumin: 1.8 g/dL — ABNORMAL LOW (ref 3.5–5.0)
Alkaline Phosphatase: 1138 U/L — ABNORMAL HIGH (ref 38–126)
Anion gap: 8 (ref 5–15)
BUN: 12 mg/dL (ref 6–20)
CO2: 27 mmol/L (ref 22–32)
Calcium: 8.1 mg/dL — ABNORMAL LOW (ref 8.9–10.3)
Chloride: 99 mmol/L (ref 98–111)
Creatinine, Ser: 0.84 mg/dL (ref 0.61–1.24)
GFR, Estimated: >60 mL/min (ref >60)
Glucose, Bld: 113 mg/dL — ABNORMAL HIGH (ref 70–99)
Potassium: 3.6 mmol/L (ref 3.5–5.1)
Sodium: 134 mmol/L — ABNORMAL LOW (ref 135–145)
Total Bilirubin: 8.1 mg/dL — ABNORMAL HIGH (ref 0.3–1.2)
Total Protein: 5.6 g/dL — ABNORMAL LOW (ref 6.5–8.1)

## 2020-11-06 LAB — GLUCOSE, CAPILLARY
Glucose-Capillary: 122 mg/dL — ABNORMAL HIGH (ref 70–99)
Glucose-Capillary: 112 mg/dL — ABNORMAL HIGH (ref 70–99)
Glucose-Capillary: 145 mg/dL — ABNORMAL HIGH (ref 70–99)
Glucose-Capillary: 154 mg/dL — ABNORMAL HIGH (ref 70–99)

## 2020-11-06 LAB — C-REACTIVE PROTEIN: CRP: 36.1 mg/dL — ABNORMAL HIGH (ref <1.0)

## 2020-11-06 MED ORDER — ACETAMINOPHEN 650 MG RE SUPP: 650.0000 mg | Freq: Four times a day (QID) | RECTAL | Status: DC | PRN

## 2020-11-06 MED ORDER — IPRATROPIUM-ALBUTEROL 0.5-2.5 (3) MG/3ML IN SOLN: 3.0000 mL | Freq: Four times a day (QID) | RESPIRATORY_TRACT | Status: DC | PRN

## 2020-11-06 MED ORDER — RISPERIDONE 1 MG PO TABS
1.0000 mg | ORAL_TABLET | Freq: Every day | ORAL | Status: DC
  Administered 2020-11-06 – 2020-11-23 (×18): 1 mg via ORAL
  Filled 2020-11-06 (×20): qty 1

## 2020-11-06 MED ORDER — TRAZODONE HCL 150 MG PO TABS
150.0000 mg | ORAL_TABLET | Freq: Every day | ORAL | Status: DC
  Filled 2020-11-06 (×4): qty 1

## 2020-11-06 MED ORDER — PANTOPRAZOLE SODIUM 40 MG IV SOLR
40.0000 mg | INTRAVENOUS | Status: DC
  Administered 2020-11-06: 40 mg via INTRAVENOUS
  Filled 2020-11-06: qty 40

## 2020-11-06 MED ORDER — HEPARIN SODIUM (PORCINE) 5000 UNIT/ML IJ SOLN
5000.0000 [IU] | Freq: Three times a day (TID) | INTRAMUSCULAR | Status: AC
  Administered 2020-11-06 – 2020-11-08 (×8): 5000 [IU] via SUBCUTANEOUS
  Filled 2020-11-06 (×8): qty 1

## 2020-11-06 MED ORDER — ONDANSETRON HCL 4 MG PO TABS: 4.0000 mg | ORAL_TABLET | Freq: Four times a day (QID) | ORAL | Status: DC | PRN

## 2020-11-06 MED ORDER — ACETAMINOPHEN 325 MG PO TABS
650.0000 mg | ORAL_TABLET | Freq: Four times a day (QID) | ORAL | Status: DC | PRN
  Administered 2020-11-06 – 2020-11-17 (×7): 650 mg via ORAL
  Filled 2020-11-06 (×7): qty 2

## 2020-11-06 MED ORDER — ONDANSETRON HCL 4 MG/2ML IJ SOLN
4.0000 mg | Freq: Four times a day (QID) | INTRAMUSCULAR | Status: DC | PRN
  Administered 2020-11-06 – 2020-11-11 (×4): 4 mg via INTRAVENOUS
  Filled 2020-11-06 (×3): qty 2

## 2020-11-06 MED ORDER — PANTOPRAZOLE SODIUM 40 MG PO TBEC
40.0000 mg | DELAYED_RELEASE_TABLET | Freq: Every day | ORAL | Status: DC
  Administered 2020-11-07: 40 mg via ORAL
  Filled 2020-11-06 (×2): qty 1

## 2020-11-06 MED ORDER — MORPHINE SULFATE (PF) 4 MG/ML IV SOLN
4.0000 mg | INTRAVENOUS | Status: DC | PRN
  Administered 2020-11-06 – 2020-11-21 (×38): 4 mg via INTRAVENOUS
  Filled 2020-11-06 (×38): qty 1

## 2020-11-06 NOTE — Consult Note (Signed)
[facility] Consult: 9:37 AM 11/06/2020  LOS: 5 days    Referring Provider: Dr Arbutus Ped  Primary Care Physician:  [name], DO  Primary Gastroenterologist:  Dr Laural Golden    Reason for Consultation:  Abscess vs cyst post ERCP.     HPI: Cooper Valcourt is a 50 y.o. male.  Hx of OSA.  Bipolar disorder, depression, anxiety schizoaffective disorder.  HLD.  HTN.  Unspecified thyroid disorder but not on Synthroid.Left inguinal hernia repair 2018.  No previous EGD or colonoscopy.  10/09/20 cholecystectomy complicated by postoperative bile leak treated with drain placement.  The op report mention gallbladder inflammation, multiple stones, purulence.  GB macerated and tore apart with grasping.  Cystic duct torn and clipped, JP drain placed. 10/10/2020 ERCP with sphincterotomy and placement of plastic stent. Developed post ERCP pancreatitis. And was for repeat ERCP and likely stent removal in 6 weeks. Discharged after 4/6 - 4/13 admission.  Readmitted 4/29 from surgeons's office.   Pt has developed large, complex fluid collection at the pancreatic head, uncinate, proximal body.  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  The necrotic collection is causing mass-effect on the gastric antrum/pylorus/proximal duodenum with reactive small bowel thickening.  Stent in stable position.  Intrapelvic bile duct dilation is mild to moderate likely due to mass-effect from the fluid collection.  There is fluid edema surrounding the pancreas extending into the retroperitoneum and mesentery.  Reactive thickening in the colon. T bili 8.1.  Alkaline phosphatase 1138, AST/ALT 67/149.  Lipase 73.   WBCs 12.8, down from 13.5. Patient was admitted to [facility] on 4/29 and meropenem  initiated.  At home he had moderate persistent right-sided abdominal pain, intermittent nausea vomiting, anorexia, early satiety.  Primarily consuming liquids, avoiding solids.  JP drainage of about 100 cc daily compared with initial improvement to 50 cc daily.  Patient does not consume alcoholic beverages. Family history of gallbladder surgery in his mother.  No family history of colorectal cancer.  Past Medical History:  Diagnosis Date  . Anxiety 10-2014  . Bipolar disorder ([place]) age 36  . COPD (chronic obstructive pulmonary disease) ([place]) 2016  . Depression age 31  . Enlarged prostate   . Hyperlipidemia 2016  . Hypertension 2013  . Schizoaffective disorder ([place]) 11-13-14  . Sleep apnea   . Thyroid disease 11-2014    Past Surgical History:  Procedure Laterality Date  . BILIARY STENT PLACEMENT N/A 10/10/2020   Procedure: BILIARY STENT PLACEMENT;  Surgeon: Rogene Houston, MD;  Location: [facility];  Service: Endoscopy;  Laterality: N/A;  . CARPAL TUNNEL RELEASE Left 02/21/2016   Procedure: CARPAL TUNNEL RELEASE;  Surgeon: Carole Civil, MD;  Location: [facility];  Service: Orthopedics;  Laterality: Left;  . CHOLECYSTECTOMY N/A 10/09/2020   Procedure: LAPAROSCOPIC CHOLECYSTECTOMY;  Surgeon: Virl Cagey, MD;  Location: [facility];  Service: General;  Laterality: N/A;  . ERCP N/A 10/10/2020   Procedure: ENDOSCOPIC RETROGRADE CHOLANGIOPANCREATOGRAPHY (ERCP);  Surgeon: Rogene Houston, MD;  Location: [facility];  Service: Endoscopy;  Laterality: N/A;  .  HERNIA REPAIR Left 2002   groin  . INGUINAL HERNIA REPAIR Left 04/05/2017   Procedure: RECURRENT HERNIA REPAIR INGUINAL ADULT WITH MESH;  Surgeon: [name], MD;  Location: [facility];  Service: General;  Laterality: Left;  Marland Kitchen MASS EXCISION Right 04/29/2020   Procedure: EXCISION MASS RIGHT WRIST;  Surgeon: [name], MD;  Location: [facility];  Service: Orthopedics;  Laterality: Right;  . MASS EXCISION Right 10/09/2020    Procedure: EXCISION MASS, ABDOMINAL WALL, 2CM;  Surgeon: Virl Cagey, MD;  Location: [facility];  Service: General;  Laterality: Right;  . SPHINCTEROTOMY N/A 10/10/2020   Procedure: SPHINCTEROTOMY;  Surgeon: Rogene Houston, MD;  Location: [facility];  Service: Endoscopy;  Laterality: N/A;    Prior to Admission medications   Medication Sig Start Date End Date Taking? Authorizing Provider  acetaminophen (TYLENOL) 325 MG tablet Take 650 mg by mouth every 6 (six) hours as needed.   Yes [provider]  amoxicillin-clavulanate (AUGMENTIN) 875-125 MG tablet Take 1 tablet by mouth 2 (two) times daily for 10 days. 10/31/20 11/10/20 Yes Virl Cagey, MD  cyclobenzaprine (FLEXERIL) 10 MG tablet Take 0.5 tablets (5 mg total) by mouth 3 (three) times daily as needed for muscle spasms. 10/15/20  Yes Virl Cagey, MD  docusate sodium (COLACE) 100 MG capsule Take 1 capsule (100 mg total) by mouth 2 (two) times daily as needed for mild constipation (while taking narcotics). 10/15/20  Yes Virl Cagey, MD  oxyCODONE (OXY IR/ROXICODONE) 5 MG immediate release tablet Take 1-2 tablets (5-10 mg total) by mouth every 4 (four) hours as needed for severe pain or breakthrough pain. 10/31/20  Yes Virl Cagey, MD  ondansetron (ZOFRAN ODT) 4 MG disintegrating tablet Take 1 tablet (4 mg total) by mouth every 8 (eight) hours as needed for nausea or vomiting. Patient not taking: Reported on 11/01/2020 10/15/20   Virl Cagey, MD  risperiDONE (RISPERDAL) 1 MG tablet Take 1 tablet (1 mg total) by mouth at bedtime. Further fills to psychiatry Patient not taking: Reported on 11/01/2020 10/31/20   [name], DO  traZODone (DESYREL) 150 MG tablet Take 1-2 tablets (150-300 mg total) by mouth at bedtime. Further fills per psychiatry Patient not taking: Reported on 11/01/2020 10/31/20   [name], DO    Scheduled Meds: . heparin  5,000 Units Subcutaneous Q8H  . pantoprazole (PROTONIX) IV   40 mg Intravenous Q24H   Infusions: . lactated ringers 250 mL/hr at 11/06/20 0559  . meropenem (MERREM) IV 1 g (11/06/20 0600)   PRN Meds: acetaminophen **OR** acetaminophen, diphenhydrAMINE **OR** diphenhydrAMINE, morphine injection, ondansetron **OR** ondansetron (ZOFRAN) IV   Allergies as of 11/01/2020 - Review Complete 11/01/2020  Allergen Reaction Noted  . Desipramine Nausea Only and Rash 08/24/2012    Family History  Problem Relation Age of Onset  . Diabetes Father   . Heart disease Father   . Heart disease Maternal Grandmother   . Stroke Maternal Grandfather     Social History   Socioeconomic History  . Marital status: Single    Spouse name: Not on file  . Number of children: Not on file  . Years of education: Not on file  . Highest education level: Not on file  Occupational History  . Not on file  Tobacco Use  . Smoking status: Current Every Day Smoker    Packs/day: 0.25    Years: 26.00    Pack years: 6.50    Types: Cigarettes  .  Smokeless tobacco: Never Used  . Tobacco comment: 4-5 cig daily as of 10/07/2020.  Vaping Use  . Vaping Use: Never used  Substance and Sexual Activity  . Alcohol use: No  . Drug use: No  . Sexual activity: Never    Birth control/protection: None  Other Topics Concern  . Not on file  Social History Narrative  . Not on file   Social Determinants of Health   Financial Resource Strain: Not on file  Food Insecurity: Not on file  Transportation Needs: Not on file  Physical Activity: Not on file  Stress: Not on file  Social Connections: Not on file  Intimate Partner Violence: Not on file    REVIEW OF SYSTEMS: Constitutional: Feels tired, weak but nothing profound. ENT:  No nose bleeds Pulm: No shortness of breath or cough CV:  No palpitations, no LE edema.  Angina GU: Urine is very concentrated yellow-colored.  Urinating normal volumes. GI: See HPI.  Does not suffer from reflux symptoms. Heme: No unusual bleeding or  bruising Transfusions: None ever Neuro:  No headaches, no peripheral tingling or numbness.  No syncope, no seizures. Derm:  No itching, no rash or sores.  Endocrine:  No sweats or chills.  No polyuria or dysuria Immunization: Not queried.   PHYSICAL EXAM: Vital signs in last 24 hours: Vitals:   11/06/20 0438 11/06/20 0722  BP: (!) 142/89 132/87  Pulse: 99 (!) 110  Resp: 20 20  Temp: 98.2 F (36.8 C) 98.2 F (36.8 C)  SpO2: 94% 93%   Wt Readings from Last 3 Encounters:  11/06/20 120.3 kg  10/31/20 115.7 kg  10/31/20 116 kg    General: Obese, looks ill, resting comfortably on the bed. Head: No facial asymmetry or swelling.  No signs of head trauma. Eyes: Slight scleral icterus.  No conjunctival pallor Ears: Not hard of hearing Nose: No congestion or discharge Mouth: Moist, pink, clear oropharynx.  Tongue midline Neck: No JVD, no masses, no thyromegaly Lungs: No labored breathing or cough.  Lungs clear bilaterally Heart: RRR.  No MRG.  S1, S2 present Abdomen: Distended, obese.  Moderately tense.  Bowel sounds normal quality, hypoactive.  Tender throughout but more so in the right upper quadrant.  No guarding or rebound..   Rectal: Deferred Musc/Skeltl: No joint redness, swelling or significant deformities Extremities: Slight lower extremity edema Neurologic: Alert.  Appropriate.  Oriented x3.  Moves all 4 limbs without tremor, strength not tested. Skin: No obvious jaundice. Tattoos: Several, some crude quality. Nodes: No cervical adenopathy Psych: Flat affect, cooperative, fluid speech.  Provides good history.  Intake/Output from previous day: 05/03 0701 - 05/04 0700 In: 1393.2 [I.V.:1393.2] Out: 660 [Urine:400; Drains:260] Intake/Output this shift: Total I/O In: -  Out: 260 [Urine:50; Drains:210]  LAB RESULTS: Recent Labs    11/04/20 2035 11/05/20 0609 11/06/20 0130  WBC 11.5* 13.5* 12.8*  HGB 12.2* 12.4* 11.8*  HCT 38.4* 38.7* 35.8*  PLT 335 335 328    BMET Lab Results  Component Value Date   NA 134 (L) 11/06/2020   NA 133 (L) 11/05/2020   NA 136 11/04/2020   K 3.6 11/06/2020   K 3.7 11/05/2020   K 4.3 11/04/2020   CL 99 11/06/2020   CL 98 11/05/2020   CL 101 11/04/2020   CO2 27 11/06/2020   CO2 26 11/05/2020   CO2 28 11/04/2020   GLUCOSE 113 (H) 11/06/2020   GLUCOSE 128 (H) 11/05/2020   GLUCOSE 135 (H) 11/04/2020   BUN  12 11/06/2020   BUN 15 11/05/2020   BUN 12 11/04/2020   CREATININE 0.84 11/06/2020   CREATININE 0.74 11/05/2020   CREATININE 0.94 11/04/2020   CALCIUM 8.1 (L) 11/06/2020   CALCIUM 8.1 (L) 11/05/2020   CALCIUM 8.1 (L) 11/04/2020   LFT Recent Labs    11/04/20 2035 11/05/20 0609 11/06/20 0130  PROT 6.0* 6.0* 5.6*  ALBUMIN 2.2* 2.2* 1.8*  AST 60* 67* 60*  ALT 164* 149* 111*  ALKPHOS 1,045* 1,127* 1,138*  BILITOT 3.9* 6.5* 8.1*   PT/INR Lab Results  Component Value Date   INR 1.2 11/02/2020   Hepatitis Panel No results for input(s): HEPBSAG, HCVAB, HEPAIGM, HEPBIGM in the last 72 hours. C-Diff No components found for: CDIFF Lipase     Component Value Date/Time   LIPASE 73 (H) 11/05/2020 0609    Drugs of Abuse     Component Value Date/Time   LABOPIA NONE DETECTED 10/24/2014 1136   COCAINSCRNUR NONE DETECTED 10/24/2014 1136   LABBENZ NONE DETECTED 10/24/2014 1136   AMPHETMU NONE DETECTED 10/24/2014 1136   THCU POSITIVE (A) 10/24/2014 1136   LABBARB NONE DETECTED 10/24/2014 1136     RADIOLOGY STUDIES: CT PELVIS W CONTRAST  Result Date: 11/05/2020 CLINICAL DATA:  Inpatient. Laparoscopic cholecystectomy for gallbladder empyema 10/09/2020 with postoperative bile leak treated with biliary stent placement 38/25/0539, complicated by necrotizing pancreatitis. Fever. Right upper quadrant abdominal pain. EXAM: CT ABDOMEN AND PELVIS WITHOUT AND WITH CONTRAST TECHNIQUE: Multidetector CT imaging of the abdomen and pelvis was performed following the standard protocol before and following the bolus  administration of intravenous contrast. CONTRAST:  159mL OMNIPAQUE IOHEXOL 300 MG/ML  SOLN COMPARISON:  11/01/2020 CT abdomen/pelvis. FINDINGS: Lower chest: Moderate platelike bibasilar atelectasis. Hepatobiliary: Normal liver size. No liver masses. Cholecystectomy. Percutaneous right sided surgical drain terminates in the anterior cholecystectomy bed. Mild-to-moderate diffuse intrahepatic biliary ductal dilatation appears similar to slightly worsened since 11/01/2020 CT. Stable position of biliary stent extending from the proximal common bile duct into the descending duodenal lumen. Somewhat abrupt caliber transition of the extrahepatic bile ducts just below the proximal tip of the CBD stent, unchanged. No radiopaque choledocholithiasis. No pneumobilia. Pancreas: Marked diffuse peripancreatic fat stranding and ill-defined fluid with diffuse thickening of the pancreatic parenchyma, compatible with subacute severe pancreatitis, not substantially changed. Large irregular fluid collection replacing much of the pancreatic head, neck and body measuring up to 15.4 x 9.6 x 8.2 cm (series 4/image 65), previously 14.4 x 9.5 x 7.1 cm on 11/01/2020 CT using similar measurement technique, mildly increased, with associated mass-effect on the intrapancreatic portion of the common bile duct. Additional poorly defined inflammatory fluid collection in transverse mesocolon measuring 6.7 x 5.5 cm (series 4/image 70), previously 5.9 x 5.1 cm using similar measurement technique, mildly increased. Spleen: Normal size. No mass. Adrenals/Urinary Tract: Normal adrenals. No hydronephrosis. No renal masses. No renal stones. Normal bladder. Stomach/Bowel: Stomach is nondistended. Persistent reactive wall thickening in the gastric antrum and throughout the duodenum, not substantially changed. Normal caliber small bowel with no small bowel wall thickening. Gas is seen within the appendix, located adjacent to the inferior right liver, with  surrounding inflammatory fluid, likely reactive. Similar reactive wall thickening throughout the hepatic and splenic flexures of the colon and transverse colon. No significant colonic diverticulosis. Vascular/Lymphatic: Normal caliber abdominal aorta. Patent splenic, renal and hepatic veins. Similar extrinsic mass-effect on the main portal vein and proximal right and left portal veins, which remain patent. No pathologically enlarged lymph nodes in the abdomen or  pelvis. Reproductive: Normal size prostate. Other: No pneumoperitoneum, ascites or focal fluid collection. Musculoskeletal: No aggressive appearing focal osseous lesions. Moderate lumbar spondylosis. IMPRESSION: 1. Subacute necrotizing pancreatitis. Large irregular necrotic fluid collection replacing much of the pancreatic head, neck and body, mildly increased in size since 11/01/2020 CT, with associated mass-effect on the intrapancreatic portion of the common bile duct. Additional poorly defined inflammatory fluid collection in the transverse mesocolon is mildly increased. 2. Stable position of biliary stent. Mild-to-moderate diffuse intrahepatic biliary ductal dilatation appears similar to slightly worsened since 11/01/2020 CT. 3. Stable reactive wall thickening in the gastric antrum, duodenum, hepatic and splenic flexures of the colon and transverse colon. 4. Moderate platelike bibasilar atelectasis. Electronically Signed   By: Ilona Sorrel M.D.   On: 11/05/2020 13:02   CT ABDOMEN W WO CONTRAST  Result Date: 11/05/2020 CLINICAL DATA:  Inpatient. Laparoscopic cholecystectomy for gallbladder empyema 10/09/2020 with postoperative bile leak treated with biliary stent placement 23/53/6144, complicated by necrotizing pancreatitis. Fever. Right upper quadrant abdominal pain. EXAM: CT ABDOMEN AND PELVIS WITHOUT AND WITH CONTRAST TECHNIQUE: Multidetector CT imaging of the abdomen and pelvis was performed following the standard protocol before and following the  bolus administration of intravenous contrast. CONTRAST:  171mL OMNIPAQUE IOHEXOL 300 MG/ML  SOLN COMPARISON:  11/01/2020 CT abdomen/pelvis. FINDINGS: Lower chest: Moderate platelike bibasilar atelectasis. Hepatobiliary: Normal liver size. No liver masses. Cholecystectomy. Percutaneous right sided surgical drain terminates in the anterior cholecystectomy bed. Mild-to-moderate diffuse intrahepatic biliary ductal dilatation appears similar to slightly worsened since 11/01/2020 CT. Stable position of biliary stent extending from the proximal common bile duct into the descending duodenal lumen. Somewhat abrupt caliber transition of the extrahepatic bile ducts just below the proximal tip of the CBD stent, unchanged. No radiopaque choledocholithiasis. No pneumobilia. Pancreas: Marked diffuse peripancreatic fat stranding and ill-defined fluid with diffuse thickening of the pancreatic parenchyma, compatible with subacute severe pancreatitis, not substantially changed. Large irregular fluid collection replacing much of the pancreatic head, neck and body measuring up to 15.4 x 9.6 x 8.2 cm (series 4/image 65), previously 14.4 x 9.5 x 7.1 cm on 11/01/2020 CT using similar measurement technique, mildly increased, with associated mass-effect on the intrapancreatic portion of the common bile duct. Additional poorly defined inflammatory fluid collection in transverse mesocolon measuring 6.7 x 5.5 cm (series 4/image 70), previously 5.9 x 5.1 cm using similar measurement technique, mildly increased. Spleen: Normal size. No mass. Adrenals/Urinary Tract: Normal adrenals. No hydronephrosis. No renal masses. No renal stones. Normal bladder. Stomach/Bowel: Stomach is nondistended. Persistent reactive wall thickening in the gastric antrum and throughout the duodenum, not substantially changed. Normal caliber small bowel with no small bowel wall thickening. Gas is seen within the appendix, located adjacent to the inferior right liver, with  surrounding inflammatory fluid, likely reactive. Similar reactive wall thickening throughout the hepatic and splenic flexures of the colon and transverse colon. No significant colonic diverticulosis. Vascular/Lymphatic: Normal caliber abdominal aorta. Patent splenic, renal and hepatic veins. Similar extrinsic mass-effect on the main portal vein and proximal right and left portal veins, which remain patent. No pathologically enlarged lymph nodes in the abdomen or pelvis. Reproductive: Normal size prostate. Other: No pneumoperitoneum, ascites or focal fluid collection. Musculoskeletal: No aggressive appearing focal osseous lesions. Moderate lumbar spondylosis. IMPRESSION: 1. Subacute necrotizing pancreatitis. Large irregular necrotic fluid collection replacing much of the pancreatic head, neck and body, mildly increased in size since 11/01/2020 CT, with associated mass-effect on the intrapancreatic portion of the common bile duct. Additional poorly defined inflammatory fluid  collection in the transverse mesocolon is mildly increased. 2. Stable position of biliary stent. Mild-to-moderate diffuse intrahepatic biliary ductal dilatation appears similar to slightly worsened since 11/01/2020 CT. 3. Stable reactive wall thickening in the gastric antrum, duodenum, hepatic and splenic flexures of the colon and transverse colon. 4. Moderate platelike bibasilar atelectasis. Electronically Signed   By: Ilona Sorrel M.D.   On: 11/05/2020 13:02   DG Chest Port 1 View  Result Date: 11/04/2020 CLINICAL DATA:  Fever EXAM: PORTABLE CHEST 1 VIEW COMPARISON:  Report 11/20/2014, CT 11/01/2020 FINDINGS: Subsegmental atelectasis at the bases. No pleural effusion. Stable cardiomediastinal silhouette with prominent mediastinal fat noted on CT. No pneumothorax. IMPRESSION: Subsegmental atelectasis at the bases. Electronically Signed   By: Donavan Foil M.D.   On: 11/04/2020 18:49      IMPRESSION:   *    Post ERCP pancreatitis with  necrosis and necrotic fluid collection causing mass-effect on the stomach, duodenum. Transferred to [facility] hospital for advanced ERCP intervention  *    Bile leak following messy cholecystectomy 4/27   PLAN:     *   ERCP, necrosectomy.  Timing per Dr Rush Landmark.   Need to hold SQ heparin prior to procedures.  *   Allow FL diet.    *   Suggest RD input re supplements.     [name]  11/06/2020, 9:37 AM Phone [phone]

## 2020-11-06 NOTE — Progress Notes (Signed)
Patient arrived to 4E05 from [facility] via [facility]. VS taken and patient placed on monitor. Skin assessment completed with Butch Penny CN. Administered pain med per PRN orders. Called CCMD to verify tele. Paged [facility] MD to notify of patient's arrival. Pt NPO per orders. Call bell within reach.

## 2020-11-06 NOTE — Progress Notes (Signed)
PROGRESS NOTE    Steve Romero   HGD:[id number]  DOB: 07/07/1970  PCP: [name], DO    DOA: 11/01/2020 LOS: 5   Brief Narrative   Steve Romero is a 50 y.o. male with medical history significant for COPD, bipolar disorder, schizoaffective disorder and depression presented to the ED with persistent right-sided abdominal pain, N/V, poor appetite and early satiety.  Also noted increased JP drain output.   Initially admitted at [facility] on 4/29, transferred to [facility] for GI intervention.  Recent History: 10/09/20 cholecystectomy complicated by postop bile leak due to torn cystic duct that was clipped.  JP drain placed. 10/10/2020 ERCP with sphincterotomy and placement of plastic stent.  Developed post ERCP pancreatitis. Plan was for repeat ERCP and likely stent removal in 6 weeks.   Discharged after 4/6 - 4/13 admission.  Readmitted 4/29 from surgeons's office.   Pt has developed large, complex fluid collection at the pancreatic head, uncinate, proximal body.  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  The necrotic collection is causing mass-effect on the gastric antrum/pylorus/proximal duodenum with reactive small bowel thickening.  Stent in stable position.  Intrapelvic bile duct dilation is mild to moderate likely due to mass-effect from the fluid collection.  There is fluid edema surrounding the pancreas extending into the retroperitoneum and mesentery.  Reactive thickening in the colon.     Assessment & Plan   Principal Problem:   Disorder of bile duct stent Active Problems:   Pancreatic necrosis   Elevated LFTs   Ascending cholangitis   Post-ERCP Pancreatitis with necrotic fluid collection Bile leak status post cholecystectomy 4/27 - JP drain in place. Initially admitted at [facility]. General surgery there Dr. Constance Haw d/w Dr. Rush Landmark who advised transfer to [facility] for possible advanced ERCP --GI consulted.  Dr.  Rush Landmark plans for ERCP, necrosectomy, timing TBD --Full liquid diet for now --Continue empiric Merrem --Aggressive IV fluids --IV Protonix --Follow CBC --RD consult for supplement recs --Pain control, antiemetics PRN   COPD - stable, without no exacerbation sx's.   --Duonebs PRN  Schizoaffective disorder, Depression - stable. --resume Risperdal, trazodone  Obesity: Body mass index is 38.6 kg/m.   Complicates overall care and prognosis.  Recommend lifestyle modifications including physical activity and diet for weight loss and overall long-term health.   DVT prophylaxis: heparin injection 5,000 Units Start: 11/06/20 0600   Diet:  Diet Orders (From admission, onward)    Start     Ordered   11/06/20 0954  Diet full liquid Room service appropriate? Yes; Fluid consistency: Thin  Diet effective now       Question Answer Comment  Room service appropriate? Yes   Fluid consistency: Thin      11/06/20 0953            Code Status: Full Code    Subjective 11/06/20    Pt seen at bedside. Reports ongoing abdominal pain, epigastric >> right side.  High output from JP drain continues.  Denies fever/chills.   Disposition Plan & Communication   Status is: Inpatient  Inpatient status appropriate due to severity of illness and ongoing diagnostic evaluation not appropriate for outpatient setting.   Dispo: The patient is from: home              Anticipated d/c is to: home              Patient currently not medically stable for d/c   Difficult to place patient no  Consults, Procedures, Significant Events   Consultants:   Gastroenterology  Procedures:   ERCP planned  Antimicrobials:  Anti-infectives (From admission, onward)   Start     Dose/Rate Route Frequency Ordered Stop   11/01/20 2200  piperacillin-tazobactam (ZOSYN) IVPB 3.375 g  Status:  Discontinued        3.375 g 12.5 mL/hr over 240 Minutes Intravenous Every 8 hours 11/01/20 1335 11/01/20 1406   11/01/20  1500  meropenem (MERREM) 1 g in sodium chloride 0.9 % 100 mL IVPB        1 g 200 mL/hr over 30 Minutes Intravenous Every 8 hours 11/01/20 1413     11/01/20 1430  piperacillin-tazobactam (ZOSYN) IVPB 3.375 g  Status:  Discontinued        3.375 g 100 mL/hr over 30 Minutes Intravenous  Once 11/01/20 1353 11/01/20 1406        Micro    Objective   Vitals:   11/06/20 0435 11/06/20 0438 11/06/20 0722 11/06/20 1203  BP:  (!) 142/89 132/87 134/86  Pulse:  99 (!) 110 (!) 106  Resp:  20 20 20   Temp:  98.2 F (36.8 C) 98.2 F (36.8 C) 98.6 F (37 C)  TempSrc:  Oral Oral Oral  SpO2: (!) 88% 94% 93% 94%  Weight:        Intake/Output Summary (Last 24 hours) at 11/06/2020 1411 Last data filed at 11/06/2020 0853 Gross per 24 hour  Intake 1393.22 ml  Output 850 ml  Net 543.22 ml   Filed Weights   11/06/20 0109  Weight: 120.3 kg    Physical Exam:  General exam: awake, alert, no acute distress, obese, ill-appearing Respiratory system: CTAB, no wheezes, rales or rhonchi, normal respiratory effort. Cardiovascular system: normal S1/S2, RRR, no pedal edema.   Gastrointestinal system: protuberant abdomen, tender on palpation epigastric & RUQ, JP drain in place is near full of translucent yellow fluid non-purulent Central nervous system: A&O x3. no gross focal neurologic deficits, normal speech Skin: jaundiced, dry, intact Psychiatry: normal mood, flat affect  Labs   Data Reviewed: I have personally reviewed following labs and imaging studies  CBC: Recent Labs  Lab 11/03/20 0444 11/04/20 0633 11/04/20 2035 11/05/20 0609 11/06/20 0130  WBC 11.7* 10.5 11.5* 13.5* 12.8*  NEUTROABS 8.6* 8.0* 9.7* 11.6* 10.9*  HGB 12.8* 12.1* 12.2* 12.4* 11.8*  HCT 40.6 39.3 38.4* 38.7* 35.8*  MCV 94.2 96.3 94.3 93.9 90.9  PLT 338 342 335 335 782   Basic Metabolic Panel: Recent Labs  Lab 11/02/20 0625 11/03/20 0444 11/04/20 0633 11/04/20 2035 11/05/20 0609 11/06/20 0130  NA 135 136 138 136  133* 134*  K 4.2 3.7 4.2 4.3 3.7 3.6  CL 98 100 103 101 98 99  CO2 28 25 30 28 26 27   GLUCOSE 136* 115* 124* 135* 128* 113*  BUN 12 8 9 12 15 12   CREATININE 0.90 0.88 0.83 0.94 0.74 0.84  CALCIUM 8.5* 8.1* 8.4* 8.1* 8.1* 8.1*  MG 2.0  --   --   --   --   --   PHOS 3.8  --   --   --   --   --    GFR: Estimated Creatinine Clearance: 137.4 mL/min (by C-G formula based on SCr of 0.84 mg/dL). Liver Function Tests: Recent Labs  Lab 11/03/20 0444 11/04/20 0633 11/04/20 2035 11/05/20 0609 11/06/20 0130  AST 79* 45* 60* 67* 60*  ALT 311* 201* 164* 149* 111*  ALKPHOS 908* 924* 1,045* 1,127* 1,138*  BILITOT 2.5* 2.4* 3.9* 6.5* 8.1*  PROT 6.0* 6.2* 6.0* 6.0* 5.6*  ALBUMIN 2.4* 2.4* 2.2* 2.2* 1.8*   Recent Labs  Lab 10/31/20 1612 11/02/20 0625 11/03/20 0444 11/04/20 0633 11/05/20 0609  LIPASE 97* 108* 100* 74* 73*   No results for input(s): AMMONIA in the last 168 hours. Coagulation Profile: Recent Labs  Lab 11/02/20 0625  INR 1.2   Cardiac Enzymes: No results for input(s): CKTOTAL, CKMB, CKMBINDEX, TROPONINI in the last 168 hours. BNP (last 3 results) No results for input(s): PROBNP in the last 8760 hours. HbA1C: No results for input(s): HGBA1C in the last 72 hours. CBG: Recent Labs  Lab 11/05/20 2248 11/06/20 0618 11/06/20 1319  GLUCAP 111* 122* 112*   Lipid Profile: No results for input(s): CHOL, HDL, LDLCALC, TRIG, CHOLHDL, LDLDIRECT in the last 72 hours. Thyroid Function Tests: No results for input(s): TSH, T4TOTAL, FREET4, T3FREE, THYROIDAB in the last 72 hours. Anemia Panel: No results for input(s): VITAMINB12, FOLATE, FERRITIN, TIBC, IRON, RETICCTPCT in the last 72 hours. Sepsis Labs: Recent Labs  Lab 11/05/20 2319  LATICACIDVEN 1.2    Recent Results (from the past 240 hour(s))  SARS CORONAVIRUS 2 (TAT 6-24 HRS) Nasopharyngeal Nasopharyngeal Swab     Status: None   Collection Time: 11/01/20  5:27 PM   Specimen: Nasopharyngeal Swab  Result Value  Ref Range Status   SARS Coronavirus 2 NEGATIVE NEGATIVE Final    Comment: (NOTE) SARS-CoV-2 target nucleic acids are NOT DETECTED.  The SARS-CoV-2 RNA is generally detectable in upper and lower respiratory specimens during the acute phase of infection. Negative results do not preclude SARS-CoV-2 infection, do not rule out co-infections with other pathogens, and should not be used as the sole basis for treatment or other patient management decisions. Negative results must be combined with clinical observations, patient history, and epidemiological information. The expected result is Negative.  Fact Sheet for Patients: SugarRoll.be  Fact Sheet for Healthcare Providers: https://www.woods-mathews.com/  This test is not yet approved or cleared by the [place] FDA and  has been authorized for detection and/or diagnosis of SARS-CoV-2 by FDA under an Emergency Use Authorization (EUA). This EUA will remain  in effect (meaning this test can be used) for the duration of the COVID-19 declaration under Se ction 564(b)(1) of the Act, 21 U.S.C. section 360bbb-3(b)(1), unless the authorization is terminated or revoked sooner.  Performed at [facility], [place] [address], [place], [place] [address]       Imaging Studies   CT PELVIS W CONTRAST  Result Date: 11/05/2020 CLINICAL DATA:  Inpatient. Laparoscopic cholecystectomy for gallbladder empyema 10/09/2020 with postoperative bile leak treated with biliary stent placement [id number], complicated by necrotizing pancreatitis. Fever. Right upper quadrant abdominal pain. EXAM: CT ABDOMEN AND PELVIS WITHOUT AND WITH CONTRAST TECHNIQUE: Multidetector CT imaging of the abdomen and pelvis was performed following the standard protocol before and following the bolus administration of intravenous contrast. CONTRAST:  177mL OMNIPAQUE IOHEXOL 300 MG/ML  SOLN COMPARISON:  11/01/2020 CT abdomen/pelvis. FINDINGS:  Lower chest: Moderate platelike bibasilar atelectasis. Hepatobiliary: Normal liver size. No liver masses. Cholecystectomy. Percutaneous right sided surgical drain terminates in the anterior cholecystectomy bed. Mild-to-moderate diffuse intrahepatic biliary ductal dilatation appears similar to slightly worsened since 11/01/2020 CT. Stable position of biliary stent extending from the proximal common bile duct into the descending duodenal lumen. Somewhat abrupt caliber transition of the extrahepatic bile ducts just below the proximal tip of the CBD stent, unchanged. No radiopaque choledocholithiasis. No pneumobilia. Pancreas: Marked  diffuse peripancreatic fat stranding and ill-defined fluid with diffuse thickening of the pancreatic parenchyma, compatible with subacute severe pancreatitis, not substantially changed. Large irregular fluid collection replacing much of the pancreatic head, neck and body measuring up to 15.4 x 9.6 x 8.2 cm (series 4/image 65), previously 14.4 x 9.5 x 7.1 cm on 11/01/2020 CT using similar measurement technique, mildly increased, with associated mass-effect on the intrapancreatic portion of the common bile duct. Additional poorly defined inflammatory fluid collection in transverse mesocolon measuring 6.7 x 5.5 cm (series 4/image 70), previously 5.9 x 5.1 cm using similar measurement technique, mildly increased. Spleen: Normal size. No mass. Adrenals/Urinary Tract: Normal adrenals. No hydronephrosis. No renal masses. No renal stones. Normal bladder. Stomach/Bowel: Stomach is nondistended. Persistent reactive wall thickening in the gastric antrum and throughout the duodenum, not substantially changed. Normal caliber small bowel with no small bowel wall thickening. Gas is seen within the appendix, located adjacent to the inferior right liver, with surrounding inflammatory fluid, likely reactive. Similar reactive wall thickening throughout the hepatic and splenic flexures of the colon and  transverse colon. No significant colonic diverticulosis. Vascular/Lymphatic: Normal caliber abdominal aorta. Patent splenic, renal and hepatic veins. Similar extrinsic mass-effect on the main portal vein and proximal right and left portal veins, which remain patent. No pathologically enlarged lymph nodes in the abdomen or pelvis. Reproductive: Normal size prostate. Other: No pneumoperitoneum, ascites or focal fluid collection. Musculoskeletal: No aggressive appearing focal osseous lesions. Moderate lumbar spondylosis. IMPRESSION: 1. Subacute necrotizing pancreatitis. Large irregular necrotic fluid collection replacing much of the pancreatic head, neck and body, mildly increased in size since 11/01/2020 CT, with associated mass-effect on the intrapancreatic portion of the common bile duct. Additional poorly defined inflammatory fluid collection in the transverse mesocolon is mildly increased. 2. Stable position of biliary stent. Mild-to-moderate diffuse intrahepatic biliary ductal dilatation appears similar to slightly worsened since 11/01/2020 CT. 3. Stable reactive wall thickening in the gastric antrum, duodenum, hepatic and splenic flexures of the colon and transverse colon. 4. Moderate platelike bibasilar atelectasis. Electronically Signed   By: Ilona Sorrel M.D.   On: 11/05/2020 13:02   CT ABDOMEN W WO CONTRAST  Result Date: 11/05/2020 CLINICAL DATA:  Inpatient. Laparoscopic cholecystectomy for gallbladder empyema 10/09/2020 with postoperative bile leak treated with biliary stent placement 01/75/1025, complicated by necrotizing pancreatitis. Fever. Right upper quadrant abdominal pain. EXAM: CT ABDOMEN AND PELVIS WITHOUT AND WITH CONTRAST TECHNIQUE: Multidetector CT imaging of the abdomen and pelvis was performed following the standard protocol before and following the bolus administration of intravenous contrast. CONTRAST:  144mL OMNIPAQUE IOHEXOL 300 MG/ML  SOLN COMPARISON:  11/01/2020 CT abdomen/pelvis.  FINDINGS: Lower chest: Moderate platelike bibasilar atelectasis. Hepatobiliary: Normal liver size. No liver masses. Cholecystectomy. Percutaneous right sided surgical drain terminates in the anterior cholecystectomy bed. Mild-to-moderate diffuse intrahepatic biliary ductal dilatation appears similar to slightly worsened since 11/01/2020 CT. Stable position of biliary stent extending from the proximal common bile duct into the descending duodenal lumen. Somewhat abrupt caliber transition of the extrahepatic bile ducts just below the proximal tip of the CBD stent, unchanged. No radiopaque choledocholithiasis. No pneumobilia. Pancreas: Marked diffuse peripancreatic fat stranding and ill-defined fluid with diffuse thickening of the pancreatic parenchyma, compatible with subacute severe pancreatitis, not substantially changed. Large irregular fluid collection replacing much of the pancreatic head, neck and body measuring up to 15.4 x 9.6 x 8.2 cm (series 4/image 65), previously 14.4 x 9.5 x 7.1 cm on 11/01/2020 CT using similar measurement technique, mildly increased, with associated mass-effect  on the intrapancreatic portion of the common bile duct. Additional poorly defined inflammatory fluid collection in transverse mesocolon measuring 6.7 x 5.5 cm (series 4/image 70), previously 5.9 x 5.1 cm using similar measurement technique, mildly increased. Spleen: Normal size. No mass. Adrenals/Urinary Tract: Normal adrenals. No hydronephrosis. No renal masses. No renal stones. Normal bladder. Stomach/Bowel: Stomach is nondistended. Persistent reactive wall thickening in the gastric antrum and throughout the duodenum, not substantially changed. Normal caliber small bowel with no small bowel wall thickening. Gas is seen within the appendix, located adjacent to the inferior right liver, with surrounding inflammatory fluid, likely reactive. Similar reactive wall thickening throughout the hepatic and splenic flexures of the colon  and transverse colon. No significant colonic diverticulosis. Vascular/Lymphatic: Normal caliber abdominal aorta. Patent splenic, renal and hepatic veins. Similar extrinsic mass-effect on the main portal vein and proximal right and left portal veins, which remain patent. No pathologically enlarged lymph nodes in the abdomen or pelvis. Reproductive: Normal size prostate. Other: No pneumoperitoneum, ascites or focal fluid collection. Musculoskeletal: No aggressive appearing focal osseous lesions. Moderate lumbar spondylosis. IMPRESSION: 1. Subacute necrotizing pancreatitis. Large irregular necrotic fluid collection replacing much of the pancreatic head, neck and body, mildly increased in size since 11/01/2020 CT, with associated mass-effect on the intrapancreatic portion of the common bile duct. Additional poorly defined inflammatory fluid collection in the transverse mesocolon is mildly increased. 2. Stable position of biliary stent. Mild-to-moderate diffuse intrahepatic biliary ductal dilatation appears similar to slightly worsened since 11/01/2020 CT. 3. Stable reactive wall thickening in the gastric antrum, duodenum, hepatic and splenic flexures of the colon and transverse colon. 4. Moderate platelike bibasilar atelectasis. Electronically Signed   By: Ilona Sorrel M.D.   On: 11/05/2020 13:02   DG Chest Port 1 View  Result Date: 11/04/2020 CLINICAL DATA:  Fever EXAM: PORTABLE CHEST 1 VIEW COMPARISON:  Report 11/20/2014, CT 11/01/2020 FINDINGS: Subsegmental atelectasis at the bases. No pleural effusion. Stable cardiomediastinal silhouette with prominent mediastinal fat noted on CT. No pneumothorax. IMPRESSION: Subsegmental atelectasis at the bases. Electronically Signed   By: Donavan Foil M.D.   On: 11/04/2020 18:49     Medications   Scheduled Meds: . heparin  5,000 Units Subcutaneous Q8H  . pantoprazole  40 mg Oral Q0600   Continuous Infusions: . meropenem (MERREM) IV 1 g (11/06/20 0600)        LOS: 5 days    Time spent: 30 minutes    Ezekiel Slocumb, DO [facility]  11/06/2020, 2:11 PM      If 7PM-7AM, please contact night-coverage. How to contact the [facility] Attending or Consulting provider [place] or covering provider during after hours [place], for this patient?    1. Check the care team in [facility] and look for a) attending/consulting [facility] provider listed and b) the [facility] team listed 2. Log into [email or web address] and use [place]'s universal password to access. If you do not have the password, please contact the hospital operator. 3. Locate the [facility] provider you are looking for under [facility] and page to a number that you can be directly reached. 4. If you still have difficulty reaching the provider, please page the [facility] (Director on Call) for the Hospitalists listed on amion for assistance.

## 2020-11-06 NOTE — Progress Notes (Signed)
Pharmacy Antibiotic Note  Steve Romero is a 50 y.o. male admitted at [facility] on 11/01/2020 with intra-abdominal infection/necrotizing pancreatitis. Pharmacy consulted was consulted for empiric Meropenem dosing starting on 11/01/20.    Patient was transferred on 11/05/20 to [facility] for advanced ERCP intervention.   GI plans ERCP, necrosectomy.  Timing per Dr Rush Landmark.   Patient is afebrile, WBC 13.5>12.8K CRP 36.1, LA 1.2 Renal function stable, SCr within normal  Plan: Meropenem 1000 mg IV every 8 hours. Monitor labs, c/s, and patient improvement.  Weight: 120.3 kg (265 lb 3.2 oz)  Temp (24hrs), Avg:98.2 F (36.8 C), Min:98 F (36.7 C), Max:98.7 F (37.1 C)  Recent Labs  Lab 11/03/20 0444 11/04/20 0633 11/04/20 2035 11/05/20 0609 11/05/20 2319 11/06/20 0130  WBC 11.7* 10.5 11.5* 13.5*  --  12.8*  CREATININE 0.88 0.83 0.94 0.74  --  0.84  LATICACIDVEN  --   --   --   --  1.2  --     Estimated Creatinine Clearance: 137.4 mL/min (by C-G formula based on SCr of 0.84 mg/dL).    Allergies  Allergen Reactions  . Desipramine Nausea Only and Rash    Antimicrobials this admission: Merrem 4/29 >>    Microbiology results:   None found  Thank you for allowing pharmacy to be a part of this patient's care.  Nicole Cella, RPh Clinical Pharmacist [phone] Please check AMION for all [facility] phone numbers After 10:00 PM, call [place] [phone] 11/06/2020 1:23 PM

## 2020-11-06 NOTE — Telephone Encounter (Signed)
I have called and left a message with Crystal, RN at [facility] regarding patient's clinical status. He will no longer need appointment next Tuesday. Left message to call with any concerns.

## 2020-11-06 NOTE — Hospital Course (Signed)
Steve Romero is a 50 y.o. male with medical history significant for COPD, bipolar disorder, schizoaffective disorder and depression presented to the ED with persistent right-sided abdominal pain, N/V, poor appetite and early satiety.  Also noted increased JP drain output.   Initially admitted at [facility] on 4/29, transferred to [facility] for GI intervention.  Recent History: 10/09/20 cholecystectomy complicated by postop bile leak due to torn cystic duct that was clipped.  JP drain placed. 10/10/2020 ERCP with sphincterotomy and placement of plastic stent.  Developed post ERCP pancreatitis. Plan was for repeat ERCP and likely stent removal in 6 weeks.   Discharged after 4/6 - 4/13 admission.  Readmitted 4/29 from surgeons's office.   Pt has developed large, complex fluid collection at the pancreatic head, uncinate, proximal body.  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  The necrotic collection is causing mass-effect on the gastric antrum/pylorus/proximal duodenum with reactive small bowel thickening.  Stent in stable position.  Intrapelvic bile duct dilation is mild to moderate likely due to mass-effect from the fluid collection.  There is fluid edema surrounding the pancreas extending into the retroperitoneum and mesentery.  Reactive thickening in the colon.

## 2020-11-06 NOTE — Progress Notes (Signed)
Patient refuses foley at this time. Pt independently ambulatory and is voiding in urinal at bedside.

## 2020-11-06 NOTE — Progress Notes (Signed)
[facility]

## 2020-11-06 NOTE — Progress Notes (Signed)
Pt O2 Sat 88% on room air. Applied 2L O2 by [place], sat now 94%.

## 2020-11-07 ENCOUNTER — Encounter (HOSPITAL_COMMUNITY)
Admission: RE | Disposition: A | Discharge status: Home / Self Care | Payer: Self-pay | Source: Ambulatory Visit | Attending: Internal Medicine

## 2020-11-07 ENCOUNTER — Encounter (HOSPITAL_COMMUNITY): Payer: Self-pay

## 2020-11-07 ENCOUNTER — Inpatient Hospital Stay (HOSPITAL_COMMUNITY): Payer: Medicaid Other | Admitting: Certified Registered Nurse Anesthetist

## 2020-11-07 ENCOUNTER — Ambulatory Visit: Payer: Medicaid Other | Admitting: General Surgery

## 2020-11-07 DIAGNOSIS — K862 Cyst of pancreas: Secondary | ICD-10-CM | POA: Diagnosis not present

## 2020-11-07 DIAGNOSIS — K209 Esophagitis, unspecified without bleeding: Secondary | ICD-10-CM

## 2020-11-07 DIAGNOSIS — R945 Abnormal results of liver function studies: Secondary | ICD-10-CM

## 2020-11-07 DIAGNOSIS — K2289 Other specified disease of esophagus: Secondary | ICD-10-CM

## 2020-11-07 DIAGNOSIS — E43 Unspecified severe protein-calorie malnutrition: Secondary | ICD-10-CM

## 2020-11-07 DIAGNOSIS — T859XXD Unspecified complication of internal prosthetic device, implant and graft, subsequent encounter: Secondary | ICD-10-CM | POA: Diagnosis not present

## 2020-11-07 DIAGNOSIS — K8689 Other specified diseases of pancreas: Secondary | ICD-10-CM | POA: Diagnosis not present

## 2020-11-07 DIAGNOSIS — K3189 Other diseases of stomach and duodenum: Secondary | ICD-10-CM | POA: Diagnosis not present

## 2020-11-07 DIAGNOSIS — K8309 Other cholangitis: Secondary | ICD-10-CM | POA: Diagnosis not present

## 2020-11-07 DIAGNOSIS — K859 Acute pancreatitis without necrosis or infection, unspecified: Secondary | ICD-10-CM

## 2020-11-07 HISTORY — PX: UPPER ESOPHAGEAL ENDOSCOPIC ULTRASOUND (EUS): SHX6562

## 2020-11-07 HISTORY — PX: BALLOON DILATION: SHX5330

## 2020-11-07 HISTORY — PX: ESOPHAGOGASTRODUODENOSCOPY (EGD) WITH PROPOFOL: SHX5813

## 2020-11-07 HISTORY — PX: CYST ENTEROSTOMY: SHX6861

## 2020-11-07 HISTORY — PX: BIOPSY: SHX5522

## 2020-11-07 HISTORY — PX: BILIARY STENT PLACEMENT: SHX5538

## 2020-11-07 LAB — COMPREHENSIVE METABOLIC PANEL
ALT: 80 U/L — ABNORMAL HIGH (ref 0–44)
AST: 36 U/L (ref 15–41)
Albumin: 1.6 g/dL — ABNORMAL LOW (ref 3.5–5.0)
Alkaline Phosphatase: 1007 U/L — ABNORMAL HIGH (ref 38–126)
Anion gap: 8 (ref 5–15)
BUN: 13 mg/dL (ref 6–20)
CO2: 28 mmol/L (ref 22–32)
Calcium: 8.2 mg/dL — ABNORMAL LOW (ref 8.9–10.3)
Chloride: 98 mmol/L (ref 98–111)
Creatinine, Ser: 0.74 mg/dL (ref 0.61–1.24)
GFR, Estimated: >60 mL/min (ref >60)
Glucose, Bld: 115 mg/dL — ABNORMAL HIGH (ref 70–99)
Potassium: 4.3 mmol/L (ref 3.5–5.1)
Sodium: 134 mmol/L — ABNORMAL LOW (ref 135–145)
Total Bilirubin: 4.7 mg/dL — ABNORMAL HIGH (ref 0.3–1.2)
Total Protein: 5.6 g/dL — ABNORMAL LOW (ref 6.5–8.1)

## 2020-11-07 LAB — CBC
HCT: 35.2 % — ABNORMAL LOW (ref 39.0–52.0)
Hemoglobin: 11.4 g/dL — ABNORMAL LOW (ref 13.0–17.0)
MCH: 29.8 pg (ref 26.0–34.0)
MCHC: 32.4 g/dL (ref 30.0–36.0)
MCV: 92.1 fL (ref 80.0–100.0)
Platelets: 327 10*3/uL (ref 150–400)
RBC: 3.82 MIL/uL — ABNORMAL LOW (ref 4.22–5.81)
RDW: 14.2 % (ref 11.5–15.5)
WBC: 12.2 10*3/uL — ABNORMAL HIGH (ref 4.0–10.5)
nRBC: 0 % (ref 0.0–0.2)

## 2020-11-07 LAB — GLUCOSE, CAPILLARY
Glucose-Capillary: 108 mg/dL — ABNORMAL HIGH (ref 70–99)
Glucose-Capillary: 110 mg/dL — ABNORMAL HIGH (ref 70–99)
Glucose-Capillary: 117 mg/dL — ABNORMAL HIGH (ref 70–99)
Glucose-Capillary: 125 mg/dL — ABNORMAL HIGH (ref 70–99)
Glucose-Capillary: 127 mg/dL — ABNORMAL HIGH (ref 70–99)
Glucose-Capillary: 131 mg/dL — ABNORMAL HIGH (ref 70–99)
Glucose-Capillary: 149 mg/dL — ABNORMAL HIGH (ref 70–99)

## 2020-11-07 SURGERY — UPPER ESOPHAGEAL ENDOSCOPIC ULTRASOUND (EUS)
Anesthesia: General

## 2020-11-07 MED ORDER — PROPOFOL 10 MG/ML IV BOLUS
INTRAVENOUS | Status: DC | PRN
  Administered 2020-11-07: 200 mg via INTRAVENOUS

## 2020-11-07 MED ORDER — MIDAZOLAM HCL 5 MG/5ML IJ SOLN
INTRAMUSCULAR | Status: DC | PRN
  Administered 2020-11-07: 2 mg via INTRAVENOUS

## 2020-11-07 MED ORDER — LACTATED RINGERS IV SOLN: INTRAVENOUS | Status: DC | PRN

## 2020-11-07 MED ORDER — ONDANSETRON HCL 4 MG/2ML IJ SOLN
INTRAMUSCULAR | Status: DC | PRN
  Administered 2020-11-07: 4 mg via INTRAVENOUS

## 2020-11-07 MED ORDER — SUGAMMADEX SODIUM 200 MG/2ML IV SOLN
INTRAVENOUS | Status: DC | PRN
  Administered 2020-11-07: 200 mg via INTRAVENOUS
  Administered 2020-11-07: 100 mg via INTRAVENOUS

## 2020-11-07 MED ORDER — MIDAZOLAM HCL 2 MG/2ML IJ SOLN
INTRAMUSCULAR | Status: AC
  Filled 2020-11-07: qty 2

## 2020-11-07 MED ORDER — DEXAMETHASONE SODIUM PHOSPHATE 10 MG/ML IJ SOLN
INTRAMUSCULAR | Status: DC | PRN
  Administered 2020-11-07: 5 mg via INTRAVENOUS

## 2020-11-07 MED ORDER — ROCURONIUM BROMIDE 100 MG/10ML IV SOLN
INTRAVENOUS | Status: DC | PRN
  Administered 2020-11-07: 40 mg via INTRAVENOUS
  Administered 2020-11-07: 20 mg via INTRAVENOUS

## 2020-11-07 MED ORDER — ADULT MULTIVITAMIN W/MINERALS CH
1.0000 | ORAL_TABLET | Freq: Every day | ORAL | Status: DC
  Administered 2020-11-08 – 2020-11-24 (×15): 1 via ORAL
  Filled 2020-11-07 (×16): qty 1

## 2020-11-07 MED ORDER — ENSURE ENLIVE PO LIQD
237.0000 mL | Freq: Three times a day (TID) | ORAL | Status: DC
  Administered 2020-11-07 – 2020-11-13 (×15): 237 mL via ORAL

## 2020-11-07 MED ORDER — PHENYLEPHRINE HCL-NACL 10-0.9 MG/250ML-% IV SOLN
INTRAVENOUS | Status: DC | PRN
  Administered 2020-11-07: 25 ug/min via INTRAVENOUS

## 2020-11-07 MED ORDER — SUCCINYLCHOLINE CHLORIDE 20 MG/ML IJ SOLN
INTRAMUSCULAR | Status: DC | PRN
  Administered 2020-11-07: 140 mg via INTRAVENOUS

## 2020-11-07 MED ORDER — FENTANYL CITRATE (PF) 100 MCG/2ML IJ SOLN
INTRAMUSCULAR | Status: AC
  Filled 2020-11-07: qty 2

## 2020-11-07 MED ORDER — FENTANYL CITRATE (PF) 100 MCG/2ML IJ SOLN: 25.0000 ug | INTRAMUSCULAR | Status: DC | PRN

## 2020-11-07 MED ORDER — FENTANYL CITRATE (PF) 250 MCG/5ML IJ SOLN
INTRAMUSCULAR | Status: DC | PRN
  Administered 2020-11-07 (×2): 50 ug via INTRAVENOUS

## 2020-11-07 MED ORDER — PHENYLEPHRINE 40 MCG/ML (10ML) SYRINGE FOR IV PUSH (FOR BLOOD PRESSURE SUPPORT)
PREFILLED_SYRINGE | INTRAVENOUS | Status: DC | PRN
  Administered 2020-11-07 (×4): 80 ug via INTRAVENOUS

## 2020-11-07 MED ORDER — LIDOCAINE 2% (20 MG/ML) 5 ML SYRINGE
INTRAMUSCULAR | Status: DC | PRN
  Administered 2020-11-07: 60 mg via INTRAVENOUS

## 2020-11-07 MED ORDER — LACTATED RINGERS IV SOLN
INTRAVENOUS | Status: DC
  Administered 2020-11-07: 1000 mL via INTRAVENOUS

## 2020-11-07 NOTE — Anesthesia Postprocedure Evaluation (Signed)
Anesthesia Post Note  Patient: Steve Romero  Procedure(s) Performed: ENDOSCOPIC RETROGRADE CHOLANGIOPANCREATOGRAPHY (ERCP) (N/A ) UPPER ESOPHAGEAL ENDOSCOPIC ULTRASOUND (EUS) (N/A ) CYST ENTEROSTOMY (N/A ) BIOPSY BALLOON DILATION (N/A ) BILIARY STENT PLACEMENT     Patient location during evaluation: PACU Anesthesia Type: General Level of consciousness: awake and alert Pain management: pain level controlled Vital Signs Assessment: post-procedure vital signs reviewed and stable Respiratory status: spontaneous breathing, nonlabored ventilation, respiratory function stable and patient connected to nasal cannula oxygen Cardiovascular status: blood pressure returned to baseline and stable Postop Assessment: no apparent nausea or vomiting Anesthetic complications: no   No complications documented.  Last Vitals:  Vitals:   11/07/20 1526 11/07/20 1535  BP: (!) 178/96 (!) 161/89  Pulse: (!) 110 (!) 112  Resp: (!) 28 (!) 26  Temp:    SpO2: 99% 98%    Last Pain:  Vitals:   11/07/20 1535  TempSrc:   PainSc: 0-No pain                 Steve Romero

## 2020-11-07 NOTE — Transfer of Care (Signed)
Immediate Anesthesia Transfer of Care Note  Patient: Kingston Shawgo  Procedure(s) Performed: ENDOSCOPIC RETROGRADE CHOLANGIOPANCREATOGRAPHY (ERCP) (N/A ) UPPER ESOPHAGEAL ENDOSCOPIC ULTRASOUND (EUS) (N/A ) CYST ENTEROSTOMY (N/A ) BIOPSY BALLOON DILATION (N/A ) BILIARY STENT PLACEMENT  Patient Location: Endoscopy Unit  Anesthesia Type:General  Level of Consciousness: drowsy and patient cooperative  Airway & Oxygen Therapy: Patient Spontanous Breathing and Patient connected to face mask oxygen  Post-op Assessment: Report given to RN and Post -op Vital signs reviewed and stable  Post vital signs: Reviewed  Last Vitals:  Vitals Value Taken Time  BP    Temp    Pulse 108 11/07/20 1521  Resp 27 11/07/20 1521  SpO2 100 % 11/07/20 1521  Vitals shown include unvalidated device data.  Last Pain:  Vitals:   11/07/20 1251  TempSrc:   PainSc: 6       Patients Stated Pain Goal: 0 (11/73/56 7014)  Complications: No complications documented.

## 2020-11-07 NOTE — Progress Notes (Signed)
Initial Nutrition Assessment  DOCUMENTATION CODES:  Severe malnutrition in context of acute illness/injury  INTERVENTION:  Advance diet as medically able and as tolerated.  Once diet is advanced to at least full liquids, add Ensure Enlive po TID, each supplement provides 350 kcal and 20 grams of protein.  Once diet is advanced to at least full liquids, add Magic cup TID with meals, each supplement provides 290 kcal and 9 grams of protein.  Add MVI with minerals daily.  NUTRITION DIAGNOSIS:  Severe Malnutrition related to acute illness as evidenced by energy intake < or equal to 50% for > or equal to 5 days,percent weight loss,moderate fat depletion.  GOAL:  Patient will meet greater than or equal to 90% of their needs  MONITOR:  Diet advancement,Labs,Weight trends,Skin,I & O's  REASON FOR ASSESSMENT:  Consult Assessment of nutrition requirement/status  ASSESSMENT:  50 yo male with a PMH of OSA, bipolar disorder, depression/anxiety, schizoaffective disorder, HLD, HTN, and unspecified thyroid disorder who presents from [facility] (admitted 4/29) with intra-abdominal infection/necrotizing pancreatitis and transferred to [facility] on 5/3 for advanced ERCP intervention. 4/6 - cholecystectomy complicated by postop bile leak due to torn cystic duct that was clipped - JP drain placed 4/7 - ERCP with sphincterotomy and placement of plastic stent - developed post ERCP pancreatitis 4/27 - Bile leak following messy cholecystectomy  5/5 - plan for necrosectomy  Spoke with pt at bedside. Pt reports eating less at home and reports that if he has one meal, he is not hungry for the next. Pt drinking 75-100% of full liquid diet and only ate 5% of soft diet. Pt again NPO for stent procedure today - pt aware.  Pt reports a 20 lb weight loss since the beginning of April when all of this occurred. He reports weighing 270 at the doctor's office prior and now weighs 250 lbs. Epic endorses this. Pt has lost 7.4% of  his BW in 1 month, which is significant and severe.  On exam, pt has moderate depletions in face, but none in the rest of the body.  Once diet is advanced, add Ensure TID and Magic Cup TID to promote caloric and protein intake. Also recommend MVI with minerals daily.  Medications: Protonix, Risperdal, trazodone, morphine Labs: reviewed; Na 134, CBG 108-154  NUTRITION - FOCUSED PHYSICAL EXAM: Flowsheet Row Most Recent Value  Orbital Region Moderate depletion  Upper Arm Region No depletion  Thoracic and Lumbar Region No depletion  Buccal Region Moderate depletion  Temple Region Moderate depletion  Clavicle Bone Region No depletion  Clavicle and Acromion Bone Region No depletion  Scapular Bone Region No depletion  Dorsal Hand No depletion  Patellar Region No depletion  Anterior Thigh Region No depletion  Posterior Calf Region No depletion  Edema (RD Assessment) Mild  Hair Reviewed  Eyes Reviewed  Mouth Reviewed  Skin Reviewed  Nails Reviewed     Diet Order:   Diet Order            Diet NPO time specified  Diet effective now                EDUCATION NEEDS:  Education needs have been addressed  Skin:  Skin Assessment: Skin Integrity Issues: Skin Integrity Issues:: Incisions,Other (Comment) Incisions: Abdomen, closed Other: Jaundice  Last BM:  11/05/20  Height:  Ht Readings from Last 1 Encounters:  10/31/20 5' 9.5" (1.765 m)   Weight:  Wt Readings from Last 1 Encounters:  11/06/20 120.3 kg   Ideal Body  Weight:  74.1 kg  BMI:  Body mass index is 38.6 kg/m.  Estimated Nutritional Needs:  Kcal:  2100-2300 Protein:  105-120 grams Fluid:  >2 L  Derrel Nip, RD, LDN Registered Dietitian After Hours/Weekend Pager # in [place]

## 2020-11-07 NOTE — Anesthesia Procedure Notes (Signed)
Procedure Name: Intubation Date/Time: 11/07/2020 1:34 PM Performed by: Janene Harvey, CRNA Pre-anesthesia Checklist: Patient identified, Emergency Drugs available, Suction available and Patient being monitored Patient Re-evaluated:Patient Re-evaluated prior to induction Oxygen Delivery Method: Circle system utilized Preoxygenation: Pre-oxygenation with 100% oxygen Induction Type: IV induction and Rapid sequence Laryngoscope Size: Mac and 4 Grade View: Grade I Tube type: Oral Tube size: 7.5 mm Number of attempts: 1 Airway Equipment and Method: Stylet and Oral airway Placement Confirmation: ETT inserted through vocal cords under direct vision,  positive ETCO2 and breath sounds checked- equal and bilateral Secured at: 22 cm Tube secured with: Tape Dental Injury: Teeth and Oropharynx as per pre-operative assessment

## 2020-11-07 NOTE — Progress Notes (Signed)
PROGRESS NOTE    Steve Romero   [id number]  DOB: [birth year]  PCP: [name], DO    DOA: 11/01/2020 LOS: 6   Brief Narrative   Steve Romero is a 50 y.o. male with medical history significant for COPD, bipolar disorder, schizoaffective disorder and depression presented to the ED with persistent right-sided abdominal pain, N/V, poor appetite and early satiety.  Also noted increased JP drain output.   Initially admitted at [facility] on 4/29, transferred to [facility] for GI intervention.  Recent History: 10/09/20 cholecystectomy complicated by postop bile leak due to torn cystic duct that was clipped.  JP drain placed. 10/10/2020 ERCP with sphincterotomy and placement of plastic stent.  Developed post ERCP pancreatitis. Plan was for repeat ERCP and likely stent removal in 6 weeks.   Discharged after 4/6 - 4/13 admission.  Readmitted 4/29 from surgeons's office.   Pt has developed large, complex fluid collection at the pancreatic head, uncinate, proximal body.  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  The necrotic collection is causing mass-effect on the gastric antrum/pylorus/proximal duodenum with reactive small bowel thickening.  Stent in stable position.  Intrapelvic bile duct dilation is mild to moderate likely due to mass-effect from the fluid collection.  There is fluid edema surrounding the pancreas extending into the retroperitoneum and mesentery.  Reactive thickening in the colon.     Assessment & Plan   Principal Problem:   Disorder of bile duct stent Active Problems:   Pancreatic necrosis   Elevated LFTs   Ascending cholangitis   Protein-calorie malnutrition, severe   Post-ERCP Pancreatitis with necrotic fluid collection Bile leak status post cholecystectomy 4/27 - JP drain in place. Initially admitted at [facility]. General surgery there Dr. Constance Haw d/w Dr. Rush Landmark who advised transfer to [facility] for  possible advanced ERCP --GI consulted.  Dr. Rush Landmark  --ERCP today --NPO until post-procedure --Continue empiric Merrem --IV fluids --IV Protonix --Follow CBC --RD consult for supplement recs --Pain control, antiemetics PRN   COPD - stable, without no exacerbation sx's.   --Duonebs PRN  Schizoaffective disorder, Depression - stable. --resume Risperdal, trazodone  Obesity: Body mass index is 38.6 kg/m.   Complicates overall care and prognosis.  Recommend lifestyle modifications including physical activity and diet for weight loss and overall long-term health.   DVT prophylaxis: heparin injection 5,000 Units Start: 11/06/20 0600   Diet:  Diet Orders (From admission, onward)    Start     Ordered   11/09/20 0001  Diet NPO time specified  Diet effective ____        11/07/20 1603   11/07/20 1559  Diet clear liquid Room service appropriate? Yes; Fluid consistency: Thin  Diet effective now       Question Answer Comment  Room service appropriate? Yes   Fluid consistency: Thin      11/07/20 1558            Code Status: Full Code    Subjective 11/07/20    Pt reports ongoing right-sided abdominal pain.  No other complaints including fever/chills.  No acute events reported.  Disposition Plan & Communication   Status is: Inpatient  Inpatient status appropriate due to severity of illness and ongoing diagnostic evaluation not appropriate for outpatient setting.   Dispo: The patient is from: home              Anticipated d/c is to: home  Patient currently not medically stable for d/c   Difficult to place patient no   Consults, Procedures, Significant Events   Consultants:   Gastroenterology  Procedures:   ERCP planned  Antimicrobials:  Anti-infectives (From admission, onward)   Start     Dose/Rate Route Frequency Ordered Stop   11/01/20 2200  piperacillin-tazobactam (ZOSYN) IVPB 3.375 g  Status:  Discontinued        3.375 g 12.5 mL/hr over 240  Minutes Intravenous Every 8 hours 11/01/20 1335 11/01/20 1406   11/01/20 1500  meropenem (MERREM) 1 g in sodium chloride 0.9 % 100 mL IVPB        1 g 200 mL/hr over 30 Minutes Intravenous Every 8 hours 11/01/20 1413     11/01/20 1430  piperacillin-tazobactam (ZOSYN) IVPB 3.375 g  Status:  Discontinued        3.375 g 100 mL/hr over 30 Minutes Intravenous  Once 11/01/20 1353 11/01/20 1406        Micro    Objective   Vitals:   11/07/20 1535 11/07/20 1545 11/07/20 1706 11/07/20 1928  BP: (!) 161/89 (!) 161/81 128/83 126/85  Pulse: (!) 112 (!) 114 (!) 102 (!) 103  Resp: (!) 26 (!) 28 20 18   Temp:   98.7 F (37.1 C) 98.5 F (36.9 C)  TempSrc:   Oral Oral  SpO2: 98% 92% 91% 97%  Weight:      Height:    5' 9.5" (1.765 m)    Intake/Output Summary (Last 24 hours) at 11/07/2020 2018 Last data filed at 11/07/2020 1925 Gross per 24 hour  Intake 1410 ml  Output 840 ml  Net 570 ml   Filed Weights   11/06/20 0109  Weight: 120.3 kg    Physical Exam:  General exam: awake, alert, no acute distress, obese, ill-appearing Respiratory system: CTAB, no wheezes, rales or rhonchi, normal respiratory effort. Cardiovascular system: normal S1/S2, RRR, no pedal edema.   Gastrointestinal system: protuberant abdomen, tender on palpation epigastric & RUQ, JP drain in place is near full of translucent yellow fluid non-purulent Central nervous system: A&O x3. no gross focal neurologic deficits, normal speech Skin: jaundiced, dry, intact Psychiatry: normal mood, flat affect  Labs   Data Reviewed: I have personally reviewed following labs and imaging studies  CBC: Recent Labs  Lab 11/03/20 0444 11/04/20 0633 11/04/20 2035 11/05/20 0609 11/06/20 0130 11/07/20 0048  WBC 11.7* 10.5 11.5* 13.5* 12.8* 12.2*  NEUTROABS 8.6* 8.0* 9.7* 11.6* 10.9*  --   HGB 12.8* 12.1* 12.2* 12.4* 11.8* 11.4*  HCT 40.6 39.3 38.4* 38.7* 35.8* 35.2*  MCV 94.2 96.3 94.3 93.9 90.9 92.1  PLT 338 342 335 335 328 741    Basic Metabolic Panel: Recent Labs  Lab 11/02/20 0625 11/03/20 0444 11/04/20 0633 11/04/20 2035 11/05/20 0609 11/06/20 0130 11/07/20 0048  NA 135   < > 138 136 133* 134* 134*  K 4.2   < > 4.2 4.3 3.7 3.6 4.3  CL 98   < > 103 101 98 99 98  CO2 28   < > 30 28 26 27 28   GLUCOSE 136*   < > 124* 135* 128* 113* 115*  BUN 12   < > 9 12 15 12 13   CREATININE 0.90   < > 0.83 0.94 0.74 0.84 0.74  CALCIUM 8.5*   < > 8.4* 8.1* 8.1* 8.1* 8.2*  MG 2.0  --   --   --   --   --   --  PHOS 3.8  --   --   --   --   --   --    < > = values in this interval not displayed.   GFR: Estimated Creatinine Clearance: 144.2 mL/min (by C-G formula based on SCr of 0.74 mg/dL). Liver Function Tests: Recent Labs  Lab 11/04/20 0932 11/04/20 2035 11/05/20 0609 11/06/20 0130 11/07/20 0048  AST 45* 60* 67* 60* 36  ALT 201* 164* 149* 111* 80*  ALKPHOS 924* 1,045* 1,127* 1,138* 1,007*  BILITOT 2.4* 3.9* 6.5* 8.1* 4.7*  PROT 6.2* 6.0* 6.0* 5.6* 5.6*  ALBUMIN 2.4* 2.2* 2.2* 1.8* 1.6*   Recent Labs  Lab 11/02/20 0625 11/03/20 0444 11/04/20 0633 11/05/20 0609  LIPASE 108* 100* 74* 73*   No results for input(s): AMMONIA in the last 168 hours. Coagulation Profile: Recent Labs  Lab 11/02/20 0625  INR 1.2   Cardiac Enzymes: No results for input(s): CKTOTAL, CKMB, CKMBINDEX, TROPONINI in the last 168 hours. BNP (last 3 results) No results for input(s): PROBNP in the last 8760 hours. HbA1C: No results for input(s): HGBA1C in the last 72 hours. CBG: Recent Labs  Lab 11/07/20 0345 11/07/20 0826 11/07/20 1125 11/07/20 1706 11/07/20 2003  GLUCAP 131* 117* 110* 125* 149*   Lipid Profile: No results for input(s): CHOL, HDL, LDLCALC, TRIG, CHOLHDL, LDLDIRECT in the last 72 hours. Thyroid Function Tests: No results for input(s): TSH, T4TOTAL, FREET4, T3FREE, THYROIDAB in the last 72 hours. Anemia Panel: No results for input(s): VITAMINB12, FOLATE, FERRITIN, TIBC, IRON, RETICCTPCT in the last 72  hours. Sepsis Labs: Recent Labs  Lab 11/05/20 2319  LATICACIDVEN 1.2    Recent Results (from the past 240 hour(s))  SARS CORONAVIRUS 2 (TAT 6-24 HRS) Nasopharyngeal Nasopharyngeal Swab     Status: None   Collection Time: 11/01/20  5:27 PM   Specimen: Nasopharyngeal Swab  Result Value Ref Range Status   SARS Coronavirus 2 NEGATIVE NEGATIVE Final    Comment: (NOTE) SARS-CoV-2 target nucleic acids are NOT DETECTED.  The SARS-CoV-2 RNA is generally detectable in upper and lower respiratory specimens during the acute phase of infection. Negative results do not preclude SARS-CoV-2 infection, do not rule out co-infections with other pathogens, and should not be used as the sole basis for treatment or other patient management decisions. Negative results must be combined with clinical observations, patient history, and epidemiological information. The expected result is Negative.  Fact Sheet for Patients: SugarRoll.be  Fact Sheet for Healthcare Providers: https://www.woods-mathews.com/  This test is not yet approved or cleared by the [place] FDA and  has been authorized for detection and/or diagnosis of SARS-CoV-2 by FDA under an Emergency Use Authorization (EUA). This EUA will remain  in effect (meaning this test can be used) for the duration of the COVID-19 declaration under Se ction 564(b)(1) of the Act, 21 U.S.C. section 360bbb-3(b)(1), unless the authorization is terminated or revoked sooner.  Performed at [facility], [place] [address], [place], [place] [address]   Aerobic/Anaerobic Culture w Gram Stain (surgical/deep wound)     Status: None (Preliminary result)   Collection Time: 11/07/20  3:03 PM   Specimen: PATH Cytology FNA; Body Fluid  Result Value Ref Range Status   Specimen Description FLUID PANCREATIC CYST DRAINAGE  Final   Special Requests A  Final   Gram Stain   Final    NO WBC SEEN RARE YEAST Performed at  [facility], [address], [place], [place] [address]    Culture  PENDING  Incomplete   Report Status PENDING  Incomplete      Imaging Studies   No results found.   Medications   Scheduled Meds: . feeding supplement  237 mL Oral TID BM  . heparin  5,000 Units Subcutaneous Q8H  . multivitamin with minerals  1 tablet Oral Daily  . pantoprazole  40 mg Oral Q0600  . risperiDONE  1 mg Oral QHS  . traZODone  150 mg Oral QHS   Continuous Infusions: . meropenem (MERREM) IV 1 g (11/07/20 0533)       LOS: 6 days    Time spent: 30 minutes    Ezekiel Slocumb, DO [facility]  11/07/2020, 8:18 PM      If 7PM-7AM, please contact night-coverage. How to contact the [facility] Attending or Consulting provider Cross or covering provider during after hours [place], for this patient?    1. Check the care team in [facility] and look for a) attending/consulting [facility] provider listed and b) the [facility] team listed 2. Log into [email or web address] and use [place]'s universal password to access. If you do not have the password, please contact the hospital operator. 3. Locate the [facility] provider you are looking for under [facility] and page to a number that you can be directly reached. 4. If you still have difficulty reaching the provider, please page the [facility] (Director on Call) for the Hospitalists listed on amion for assistance.

## 2020-11-07 NOTE — Op Note (Signed)
[facility] Patient Name: Steve Romero Procedure Date : 11/07/2020 MRN: [id number] Attending MD: Justice Britain , MD Date of Birth: [birth year] CSN: [id number] Age: 50 Admit Type: Inpatient Procedure:                Upper EUS Indications:              Pancreatic cyst on CT scan, Abnormal liver function                            test, Acute pancreatitis, Generalized abdominal                            pain, For cyst enterostomy Providers:                Justice Britain, MD, Kary Kos RN, RN, Cletis Athens, Technician Referring MD:             Lanell Matar. Dondra Spry, MD, Hildred Laser, MD, Maylon Peppers, [facility] Medicines:                General Anesthesia, Meropenem per orders given Complications:            No immediate complications. Estimated Blood Loss:     Estimated blood loss was minimal. Procedure:                Pre-Anesthesia Assessment:                           - Prior to the procedure, a History and Physical                            was performed, and patient medications and                            allergies were reviewed. The patient's tolerance of                            previous anesthesia was also reviewed. The risks                            and benefits of the procedure and the sedation                            options and risks were discussed with the patient.                            All questions were answered, and informed consent                            was obtained. Prior Anticoagulants: The patient has  taken heparin, last dose was 1 day prior to                            procedure. ASA Grade Assessment: III - A patient                            with severe systemic disease. After reviewing the                            risks and benefits, the patient was deemed in                            satisfactory condition  to undergo the procedure.                           After obtaining informed consent, the endoscope was                            passed under direct vision. Throughout the                            procedure, the patient's blood pressure, pulse, and                            oxygen saturations were monitored continuously. The                            [id number] ([id number]) Olympus therapeutic                            gastroscope was introduced through the mouth, and                            advanced to the second part of duodenum. The                            [id number] ([id number]) Olympus duodenoscope was                            introduced through the mouth, and advanced to the                            second part of duodenum. The [id number] ([id number])                            Olympus Linear EUS was introduced through the                            mouth, and advanced to the duodenum for ultrasound                            examination from the stomach and duodenum. The  upper EUS was accomplished without difficulty. The                            patient tolerated the procedure. Scope In: Scope Out: Findings:      ENDOSCOPIC FINDING: :      LA Grade B (one or more mucosal breaks greater than 5 mm, not extending       between the tops of two mucosal folds) esophagitis with no bleeding was       found in the distal esophagus.      No other gross lesions were noted in the entire esophagus.      The Z-line was irregular and was found 40 cm from the incisors.      Patchy mildly erythematous mucosa without bleeding was found in the       entire examined stomach. Biopsies were taken with a cold forceps for       histology and Helicobacter pylori testing.      A severe extrinsic deformity was found in the duodenal bulb, in the       first portion of the duodenum and in the second portion of the duodenum.      A previously placed biliary stent was not  seen in the ampulla.       Unfortuantely trying to get the ERCP scope to be in stable position to       allow adequate visualization of the ampulla was not possible due to the       significant deformity and impression (even after initial cyst       decompression (as noted below).      ENDOSONOGRAPHIC FINDING: :      A heterogenous lesion suggestive of a pseudocyst was identified in the       genu of the pancreas and pancreatic body. It is not in obvious       communication with the pancreatic duct. The lesion measured 100 mm by 90       mm in maximal cross-sectional diameter. There were a few compartments       thinly septated. The outer wall of the lesion was thick. There was no       associated mass. There was internal debris within the fluid-filled       cavity - consistent with walled off necrosis. The decision was made to       create a cystogastrostomy using the AXIOS stent system. Once an       appropriate position in the stomach (this was at the area of the       incisura) was identified, the common wall between the stomach and the       cyst was interrogated utilizing color Doppler imaging to identify       interposed vessels. The stomach wall and the cyst were punctured under       endosonographic guidance using the hot AXIOS stent and electrocautery       device using current that was applied to the cautery tip. The AXIOS       device was advanced into the cyst, and a 20 x 10 mm AXIOS stent was       placed with the flanges in close approximation to the walls of the cyst       and the stomach through the cystogastrostomy. The stent was successfully       placed. Suction via Endoscope was performed  and removed 1L of fluid       (this was aspirated and sent for fluid culture). A TTS dilator was       passed through the scope. Dilation with a 12-13.5-15 mm pyloric balloon       dilator was performed to the stent. The cyst was entered and found to be       partially filled with  fluid and black necrotic tissue that was pasty and       adherent to the cyst wall. A beginning necrosectomy was performed with a       Raptor grasping device and suction via endoscope, requiring multiple       intubations of the cyst. I did not want to be too aggressive today due       to the recently placed AXIOS, but was able to remove a few larger chunks       of debris. To prevent necroma from blocking the AXIOS stent, a 7 cm 7 Fr       double pigtail stent and 7 cm 10 Fr double pigtail stent were placed       into the pseudocyst through the AXIOS cystogastrostomy stent using a       stent introducer set successfully. Impression:               EGD Impression:                           - LA Grade B esophagitis with no bleeding distally.                            No other gross lesions in esophagus. Z-line                            irregular, 40 cm from the incisors.                           - Erythematous mucosa in the stomach. Biopsied.                           - Duodenal deformity from large cyst/walled-off                            necrosis, leading to extrinsic impression.                           - Biliary stent in the duodenum was not located as                            it has migrated inwards as a result of the cyst                            pressure leading to migration. Unfortunately, the                            ERCP scope did not allow for proper seating to  evaluate the ampulla safely for ERCP.                           EUS Impression:                           - A 113 mm by 90 mm pseudocyst with evidence of                            walled off necrosis was seen in the genu/body of                            the pancreas. Cystgastrostomy created with AXIOS                            LAMS. Stent dilated. Partial Necrosectomy                            performed. Stented to prevent necroma issues with                            double  pigtails. Recommendation:           - The patient will be observed post-procedure,                            until all discharge criteria are met.                           - Return patient to hospital ward for ongoing care.                           - Advance diet as tolerated.                           - Observe patient's clinical course.                           - Stop PPI therapy to allow acid to enter into cyst                            cavity and hopefully help the necrosis.                           - Continue current antibiotics.                           - Observe patient's clinical course.                           - Plan for EGD with Necrosectomy and ERCP attempt                            at retrieval of the indwelling biliary stent that  has migrated on 5/7.                           - If unable to remove the other biliary stent but                            can at least replace one, hopefully with time I                            will be able to let things settle from the necrosis                            perspective and then have ability to remove the                            stent thereafter.                           - Hopefully with cyst decompression, we can also                            find that there is some improvement in the biliary                            obstruction that had been occuring previously.                           - The findings and recommendations were discussed                            with the patient.                           - The findings and recommendations were discussed                            with the patient's family.                           - The findings and recommendations were discussed                            with the referring physician. Procedure Code(s):        --- Professional ---                           [phone], Esophagogastroduodenoscopy, flexible,                             transoral; with transmural drainage of pseudocyst                            (includes placement of transmural drainage  catheter[s]/stent[s], when performed, and                            endoscopic ultrasound, when performed)                           [id number], Esophagogastroduodenoscopy, flexible,                            transoral; with dilation of gastric/duodenal                            stricture(s) (eg, balloon, bougie)                           [phone], Unlisted procedure, pancreas Diagnosis Code(s):        --- Professional ---                           K20.90, Esophagitis, unspecified without bleeding                           K22.8, Other specified diseases of esophagus                           K31.89, Other diseases of stomach and duodenum                           K86.3, Pseudocyst of pancreas                           K86.2, Cyst of pancreas                           R94.5, Abnormal results of liver function studies                           K85.90, Acute pancreatitis without necrosis or                            infection, unspecified                           R10.84, Generalized abdominal pain CPT copyright 2019 [employer]. All rights reserved. The codes documented in this report are preliminary and upon coder review may  be revised to meet current compliance requirements. Justice Britain, MD 11/07/2020 4:14:42 PM Number of Addenda: 0

## 2020-11-07 NOTE — Anesthesia Preprocedure Evaluation (Signed)
Anesthesia Evaluation  Patient identified by MRN, date of birth, ID band Patient awake    Reviewed: Allergy & Precautions, NPO status , Patient's Chart, lab work & pertinent test results  Airway Mallampati: II  TM Distance: >3 FB     Dental   Pulmonary sleep apnea , COPD, Current Smoker and Patient abstained from smoking.,    breath sounds clear to auscultation       Cardiovascular hypertension,  Rhythm:Regular Rate:Normal     Neuro/Psych PSYCHIATRIC DISORDERS Anxiety Depression Bipolar Disorder Schizophrenia  Neuromuscular disease    GI/Hepatic Neg liver ROS,   Endo/Other  negative endocrine ROS  Renal/GU negative Renal ROS     Musculoskeletal   Abdominal   Peds  Hematology negative hematology ROS (+)   Anesthesia Other Findings   Reproductive/Obstetrics                             Anesthesia Physical Anesthesia Plan  ASA: III  Anesthesia Plan: General   Post-op Pain Management:    Induction: Intravenous  PONV Risk Score and Plan: 2 and Ondansetron, Propofol infusion and Midazolam  Airway Management Planned: Oral ETT  Additional Equipment:   Intra-op Plan:   Post-operative Plan: Extubation in OR  Informed Consent: I have reviewed the patients History and Physical, chart, labs and discussed the procedure including the risks, benefits and alternatives for the proposed anesthesia with the patient or authorized representative who has indicated his/her understanding and acceptance.     Dental advisory given  Plan Discussed with: CRNA and Anesthesiologist  Anesthesia Plan Comments:         Anesthesia Quick Evaluation

## 2020-11-08 ENCOUNTER — Inpatient Hospital Stay: Payer: Medicaid Other | Admitting: Family

## 2020-11-08 DIAGNOSIS — K8689 Other specified diseases of pancreas: Secondary | ICD-10-CM | POA: Diagnosis not present

## 2020-11-08 DIAGNOSIS — K8309 Other cholangitis: Secondary | ICD-10-CM | POA: Diagnosis not present

## 2020-11-08 DIAGNOSIS — T859XXD Unspecified complication of internal prosthetic device, implant and graft, subsequent encounter: Secondary | ICD-10-CM | POA: Diagnosis not present

## 2020-11-08 DIAGNOSIS — R7989 Other specified abnormal findings of blood chemistry: Secondary | ICD-10-CM | POA: Diagnosis not present

## 2020-11-08 LAB — GLUCOSE, CAPILLARY
Glucose-Capillary: 116 mg/dL — ABNORMAL HIGH (ref 70–99)
Glucose-Capillary: 161 mg/dL — ABNORMAL HIGH (ref 70–99)
Glucose-Capillary: 87 mg/dL (ref 70–99)
Glucose-Capillary: 114 mg/dL — ABNORMAL HIGH (ref 70–99)
Glucose-Capillary: 140 mg/dL — ABNORMAL HIGH (ref 70–99)

## 2020-11-08 LAB — CBC
HCT: 32.1 % — ABNORMAL LOW (ref 39.0–52.0)
Hemoglobin: 10.5 g/dL — ABNORMAL LOW (ref 13.0–17.0)
MCH: 30.2 pg (ref 26.0–34.0)
MCHC: 32.7 g/dL (ref 30.0–36.0)
MCV: 92.2 fL (ref 80.0–100.0)
Platelets: 344 10*3/uL (ref 150–400)
RBC: 3.48 MIL/uL — ABNORMAL LOW (ref 4.22–5.81)
RDW: 14.5 % (ref 11.5–15.5)
WBC: 12 10*3/uL — ABNORMAL HIGH (ref 4.0–10.5)
nRBC: 0 % (ref 0.0–0.2)

## 2020-11-08 LAB — COMPREHENSIVE METABOLIC PANEL
ALT: 48 U/L — ABNORMAL HIGH (ref 0–44)
AST: 23 U/L (ref 15–41)
Albumin: 1.4 g/dL — ABNORMAL LOW (ref 3.5–5.0)
Alkaline Phosphatase: 692 U/L — ABNORMAL HIGH (ref 38–126)
Anion gap: 7 (ref 5–15)
BUN: 12 mg/dL (ref 6–20)
CO2: 30 mmol/L (ref 22–32)
Calcium: 8 mg/dL — ABNORMAL LOW (ref 8.9–10.3)
Chloride: 98 mmol/L (ref 98–111)
Creatinine, Ser: 0.69 mg/dL (ref 0.61–1.24)
GFR, Estimated: >60 mL/min (ref >60)
Glucose, Bld: 118 mg/dL — ABNORMAL HIGH (ref 70–99)
Potassium: 4.3 mmol/L (ref 3.5–5.1)
Sodium: 135 mmol/L (ref 135–145)
Total Bilirubin: 1.6 mg/dL — ABNORMAL HIGH (ref 0.3–1.2)
Total Protein: 5.1 g/dL — ABNORMAL LOW (ref 6.5–8.1)

## 2020-11-08 MED ORDER — SENNOSIDES-DOCUSATE SODIUM 8.6-50 MG PO TABS
1.0000 | ORAL_TABLET | Freq: Two times a day (BID) | ORAL | Status: DC
  Administered 2020-11-08 – 2020-11-24 (×24): 1 via ORAL
  Filled 2020-11-08 (×29): qty 1

## 2020-11-08 MED ORDER — OXYCODONE HCL 5 MG PO TABS
5.0000 mg | ORAL_TABLET | ORAL | Status: DC | PRN
Start: 2020-11-08 — End: 2020-11-24
  Administered 2020-11-08 (×2): 5 mg via ORAL
  Administered 2020-11-09 – 2020-11-14 (×14): 10 mg via ORAL
  Administered 2020-11-14: 5 mg via ORAL
  Administered 2020-11-15: 10 mg via ORAL
  Administered 2020-11-15 (×2): 5 mg via ORAL
  Administered 2020-11-19 (×2): 10 mg via ORAL
  Administered 2020-11-19: 5 mg via ORAL
  Administered 2020-11-20 – 2020-11-23 (×7): 10 mg via ORAL
  Filled 2020-11-08: qty 1
  Filled 2020-11-08 (×20): qty 2
  Filled 2020-11-08: qty 1
  Filled 2020-11-08: qty 2
  Filled 2020-11-08: qty 1
  Filled 2020-11-08 (×5): qty 2
  Filled 2020-11-08: qty 1

## 2020-11-08 MED ORDER — POLYETHYLENE GLYCOL 3350 17 G PO PACK
17.0000 g | PACK | Freq: Every day | ORAL | Status: DC
  Administered 2020-11-08 – 2020-11-21 (×9): 17 g via ORAL
  Filled 2020-11-08 (×12): qty 1

## 2020-11-08 MED ORDER — INDOMETHACIN 50 MG RE SUPP
100.0000 mg | Freq: Once | RECTAL | Status: DC
  Filled 2020-11-08: qty 2

## 2020-11-08 NOTE — Progress Notes (Addendum)
Attending physician's note   I have taken an interval history, reviewed the chart and examined the patient. I agree with the Advanced Practitioner's note, impression, and recommendations as outlined.   Patient feeling better today after procedures by Dr. Rush Landmark completed yesterday.  Liver enzymes and bilirubin downtrending.  T bili 1.6 (from 4.7 yesterday).  Improvement in pain and serologic studies certainly encouraging for improved biliary drainage.  - N.p.o. at midnight - Plan for repeat endoscopic intervention tomorrow with necrosectomy, repeat ERCP with stent retrieval  Gerrit Heck, DO, FACG [phone] office                                                                      Daily Rounding Note  11/08/2020, 11:04 AM  LOS: 7 days   SUBJECTIVE:   Chief complaint: Infected pancreatic pseudocyst.  Post ERCP pancreatitis  Abdominal pain is better.  However still requiring analgesics.  Walked in the hallway this morning.  No nausea or vomiting Overall feels a lot better.  JP drain output less than it was Tolerating clear liquids  OBJECTIVE:         Vital signs in last 24 hours:    Temp:  [98.2 F (36.8 C)-98.9 F (37.2 C)] 98.2 F (36.8 C) (05/06 0734) Pulse Rate:  [83-114] 91 (05/06 0734) Resp:  [17-28] 19 (05/06 0734) BP: (121-178)/(81-96) 121/82 (05/06 0734) SpO2:  [91 %-100 %] 99 % (05/06 0734) Weight:  [118.9 kg] 118.9 kg (05/06 0340) Last BM Date: 11/05/20 Filed Weights   11/06/20 0109 11/08/20 0340  Weight: 120.3 kg 118.9 kg   General: Patient looks well.  Comfortable. Heart: RRR. Chest: Clear bilaterally Abdomen: Obese, soft, epigastric/right upper quadrant tenderness, mild.  Active bowel sounds Extremities: No CCE Neuro/Psych: Alert.  Oriented x3.  Fluid speech.  Calm.  Normal affect.  Intake/Output from previous day: 05/05 0701 - 05/06 0700 In: [place] [P.O.:720; I.V.:800; IV Piggyback:200] Out: 320 [Urine:200; Drains:120]  Intake/Output  this shift: Total I/O In: -  Out: 200 [Urine:200]  Lab Results: Recent Labs    11/06/20 0130 11/07/20 0048 11/08/20 0146  WBC 12.8* 12.2* 12.0*  HGB 11.8* 11.4* 10.5*  HCT 35.8* 35.2* 32.1*  PLT 328 327 344   BMET Recent Labs    11/06/20 0130 11/07/20 0048 11/08/20 0146  NA 134* 134* 135  K 3.6 4.3 4.3  CL 99 98 98  CO2 27 28 30   GLUCOSE 113* 115* 118*  BUN 12 13 12   CREATININE 0.84 0.74 0.69  CALCIUM 8.1* 8.2* 8.0*   LFT Recent Labs    11/06/20 0130 11/07/20 0048 11/08/20 0146  PROT 5.6* 5.6* 5.1*  ALBUMIN 1.8* 1.6* 1.4*  AST 60* 36 23  ALT 111* 80* 48*  ALKPHOS 1,138* 1,007* 692*  BILITOT 8.1* 4.7* 1.6*   PT/INR No results for input(s): LABPROT, INR in the last 72 hours. Hepatitis Panel No results for input(s): HEPBSAG, HCVAB, HEPAIGM, HEPBIGM in the last 72 hours.  Studies/Results: No results found.  ASSESMENT:   *   Acute pancreatitis, initially diagnosed post ERCP pancreatitis in early April.  Developed necrotic, compl pancreatic cyst 11/07/2020 upper EUS with duodenal deformity due to large 113  X 90 mm pseudocyst with walled off necrosis causing extrinsic compression.  Previous biliary stent not placed, it has migrated inwards due to compression from the cyst.  Cyst gastrostomy created with Axios stent, partial necrosectomy performed.  Double-pigtail stents placed. Incidental findings of grade B esophagitis and gastric erythema, gastric biopsies performed.  *   10/09/2020 cholecystectomy of macerated gallbladder.  Postop bile leak.  Placement JP drain.   Post ERCP  pancreatitis.   PLAN   *   11/10/2018 2 repeat EGD with necrosectomy and attempt ERCP and retrieval of biliary stent.   *  FL diet today, NPO after MN Hold heparin after midnight.   [name]  11/08/2020, 11:04 AM Phone [phone]

## 2020-11-08 NOTE — Progress Notes (Signed)
PROGRESS NOTE    Steve Romero   ZJI:[id number]  DOB: 1970-12-28  PCP: [name], DO    DOA: 11/01/2020 LOS: 7   Brief Narrative   Steve Romero is a 50 y.o. male with medical history significant for COPD, bipolar disorder, schizoaffective disorder and depression presented to the ED with persistent right-sided abdominal pain, N/V, poor appetite and early satiety.  Also noted increased JP drain output.   Initially admitted at [facility] on 4/29, transferred to [facility] for GI intervention.  Recent History: 10/09/20 cholecystectomy complicated by postop bile leak due to torn cystic duct that was clipped.  JP drain placed. 10/10/2020 ERCP with sphincterotomy and placement of plastic stent.  Developed post ERCP pancreatitis. Plan was for repeat ERCP and likely stent removal in 6 weeks.   Discharged after 4/6 - 4/13 admission.  Readmitted 4/29 from surgeons's office.   Pt has developed large, complex fluid collection at the pancreatic head, uncinate, proximal body.  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  The necrotic collection is causing mass-effect on the gastric antrum/pylorus/proximal duodenum with reactive small bowel thickening.  Stent in stable position.  Intrapelvic bile duct dilation is mild to moderate likely due to mass-effect from the fluid collection.  There is fluid edema surrounding the pancreas extending into the retroperitoneum and mesentery.  Reactive thickening in the colon.     Assessment & Plan   Principal Problem:   Disorder of bile duct stent Active Problems:   Pancreatic necrosis   Elevated LFTs   Ascending cholangitis   Protein-calorie malnutrition, severe   Post-ERCP Pancreatitis with necrotic fluid collection Bile leak status post cholecystectomy 4/6 -  JP drain in place. Initially admitted at [facility]. General surgery there Dr. Constance Haw d/w Dr. Rush Landmark who advised transfer to [facility] for  possible advanced ERCP. 5/5 ERCP / EUS - see Op Note findings.  Biliary stent had migrated due to pressure from enlarging cyst. Partial necrosectomy was performed and stent dilated to relieve obstruction.   5/6 LFT's improved, jaundice resolved --GI following,  Dr. Rush Landmark - see his recs --ERCP planned for 5/7 --Full liquids today, NPO at midnight --Hold heparin after midnight --Continue empiric Merrem --Follow CBC --RD consult for supplement recs --Pain control, antiemetics PRN   COPD - stable, without no exacerbation sx's.   --Duonebs PRN  Schizoaffective disorder, Depression - stable. --resume Risperdal, trazodone  Obesity: Body mass index is 38.17 kg/m.   Complicates overall care and prognosis.  Recommend lifestyle modifications including physical activity and diet for weight loss and overall long-term health.   DVT prophylaxis: heparin injection 5,000 Units Start: 11/06/20 0600   Diet:  Diet Orders (From admission, onward)    Start     Ordered   11/09/20 0001  Diet NPO time specified  Diet effective midnight        11/08/20 1118   11/08/20 1119  Diet full liquid Room service appropriate? Yes; Fluid consistency: Thin  Diet effective now       Question Answer Comment  Room service appropriate? Yes   Fluid consistency: Thin      11/08/20 1118            Code Status: Full Code    Subjective 11/08/20    Pt states feeling a lot better today.  Still having some right-sided abdominal pain, but improved.  Denies fever or chills, nausea/vomiting.  No BM.  Having some gas type pains and belching at times.  Disposition  Plan & Communication   Status is: Inpatient  Inpatient status appropriate due to severity of illness and ongoing diagnostic evaluation not appropriate for outpatient setting. Additional procedure/s planned.  Dispo: The patient is from: home              Anticipated d/c is to: home              Patient currently not medically stable for d/c    Difficult to place patient no   Consults, Procedures, Significant Events   Consultants:   Gastroenterology  Procedures:   5/5 - upper endoscopic ultrasound  Antimicrobials:  Anti-infectives (From admission, onward)   Start     Dose/Rate Route Frequency Ordered Stop   11/01/20 2200  piperacillin-tazobactam (ZOSYN) IVPB 3.375 g  Status:  Discontinued        3.375 g 12.5 mL/hr over 240 Minutes Intravenous Every 8 hours 11/01/20 1335 11/01/20 1406   11/01/20 1500  meropenem (MERREM) 1 g in sodium chloride 0.9 % 100 mL IVPB        1 g 200 mL/hr over 30 Minutes Intravenous Every 8 hours 11/01/20 1413     11/01/20 1430  piperacillin-tazobactam (ZOSYN) IVPB 3.375 g  Status:  Discontinued        3.375 g 100 mL/hr over 30 Minutes Intravenous  Once 11/01/20 1353 11/01/20 1406        Micro    Objective   Vitals:   11/07/20 2317 11/08/20 0340 11/08/20 0734 11/08/20 1135  BP: 137/84 124/86 121/82 110/77  Pulse: 99 83 91 82  Resp: 18 17 19 17   Temp: 98.6 F (37 C) 98.4 F (36.9 C) 98.2 F (36.8 C) 98.4 F (36.9 C)  TempSrc: Oral Oral Oral Oral  SpO2: 97% 96% 99% 98%  Weight:  118.9 kg    Height:        Intake/Output Summary (Last 24 hours) at 11/08/2020 1236 Last data filed at 11/08/2020 1155 Gross per 24 hour  Intake 1720 ml  Output 380 ml  Net 1340 ml   Filed Weights   11/06/20 0109 11/08/20 0340  Weight: 120.3 kg 118.9 kg    Physical Exam:  General exam: awake, alert, no acute distress, obese Respiratory system: CTAB, no wheezes, rales or rhonchi, normal respiratory effort. Cardiovascular system: normal S1/S2, RRR, no pedal edema.   Gastrointestinal system: distended abdomen, right-sided tenderness, JP drain  Central nervous system: A&O x3. no gross focal neurologic deficits, normal speech Skin: normal color jaundice resolved, dry, intact  Labs   Data Reviewed: I have personally reviewed following labs and imaging studies  CBC: Recent Labs  Lab  11/03/20 0444 11/04/20 0633 11/04/20 2035 11/05/20 0609 11/06/20 0130 11/07/20 0048 11/08/20 0146  WBC 11.7* 10.5 11.5* 13.5* 12.8* 12.2* 12.0*  NEUTROABS 8.6* 8.0* 9.7* 11.6* 10.9*  --   --   HGB 12.8* 12.1* 12.2* 12.4* 11.8* 11.4* 10.5*  HCT 40.6 39.3 38.4* 38.7* 35.8* 35.2* 32.1*  MCV 94.2 96.3 94.3 93.9 90.9 92.1 92.2  PLT 338 342 335 335 328 327 [id number]   Basic Metabolic Panel: Recent Labs  Lab 11/02/20 0625 11/03/20 0444 11/04/20 2035 11/05/20 0609 11/06/20 0130 11/07/20 0048 11/08/20 0146  NA 135   < > 136 133* 134* 134* 135  K 4.2   < > 4.3 3.7 3.6 4.3 4.3  CL 98   < > 101 98 99 98 98  CO2 28   < > 28 26 27 28 30   GLUCOSE 136*   < >  135* 128* 113* 115* 118*  BUN 12   < > 12 15 12 13 12   CREATININE 0.90   < > 0.94 0.74 0.84 0.74 0.69  CALCIUM 8.5*   < > 8.1* 8.1* 8.1* 8.2* 8.0*  MG 2.0  --   --   --   --   --   --   PHOS 3.8  --   --   --   --   --   --    < > = values in this interval not displayed.   GFR: Estimated Creatinine Clearance: 143.3 mL/min (by C-G formula based on SCr of 0.69 mg/dL). Liver Function Tests: Recent Labs  Lab 11/04/20 2035 11/05/20 0609 11/06/20 0130 11/07/20 0048 11/08/20 0146  AST 60* 67* 60* 36 23  ALT 164* 149* 111* 80* 48*  ALKPHOS 1,045* 1,127* 1,138* 1,007* 692*  BILITOT 3.9* 6.5* 8.1* 4.7* 1.6*  PROT 6.0* 6.0* 5.6* 5.6* 5.1*  ALBUMIN 2.2* 2.2* 1.8* 1.6* 1.4*   Recent Labs  Lab 11/02/20 0625 11/03/20 0444 11/04/20 0633 11/05/20 0609  LIPASE 108* 100* 74* 73*   No results for input(s): AMMONIA in the last 168 hours. Coagulation Profile: Recent Labs  Lab 11/02/20 0625  INR 1.2   Cardiac Enzymes: No results for input(s): CKTOTAL, CKMB, CKMBINDEX, TROPONINI in the last 168 hours. BNP (last 3 results) No results for input(s): PROBNP in the last 8760 hours. HbA1C: No results for input(s): HGBA1C in the last 72 hours. CBG: Recent Labs  Lab 11/07/20 2003 11/07/20 2320 11/08/20 0339 11/08/20 0737 11/08/20 1159   GLUCAP 149* 127* 116* 161* 87   Lipid Profile: No results for input(s): CHOL, HDL, LDLCALC, TRIG, CHOLHDL, LDLDIRECT in the last 72 hours. Thyroid Function Tests: No results for input(s): TSH, T4TOTAL, FREET4, T3FREE, THYROIDAB in the last 72 hours. Anemia Panel: No results for input(s): VITAMINB12, FOLATE, FERRITIN, TIBC, IRON, RETICCTPCT in the last 72 hours. Sepsis Labs: Recent Labs  Lab 11/05/20 2319  LATICACIDVEN 1.2    Recent Results (from the past 240 hour(s))  SARS CORONAVIRUS 2 (TAT 6-24 HRS) Nasopharyngeal Nasopharyngeal Swab     Status: None   Collection Time: 11/01/20  5:27 PM   Specimen: Nasopharyngeal Swab  Result Value Ref Range Status   SARS Coronavirus 2 NEGATIVE NEGATIVE Final    Comment: (NOTE) SARS-CoV-2 target nucleic acids are NOT DETECTED.  The SARS-CoV-2 RNA is generally detectable in upper and lower respiratory specimens during the acute phase of infection. Negative results do not preclude SARS-CoV-2 infection, do not rule out co-infections with other pathogens, and should not be used as the sole basis for treatment or other patient management decisions. Negative results must be combined with clinical observations, patient history, and epidemiological information. The expected result is Negative.  Fact Sheet for Patients: SugarRoll.be  Fact Sheet for Healthcare Providers: https://www.woods-mathews.com/  This test is not yet approved or cleared by the [place] FDA and  has been authorized for detection and/or diagnosis of SARS-CoV-2 by FDA under an Emergency Use Authorization (EUA). This EUA will remain  in effect (meaning this test can be used) for the duration of the COVID-19 declaration under Se ction 564(b)(1) of the Act, 21 U.S.C. section 360bbb-3(b)(1), unless the authorization is terminated or revoked sooner.  Performed at [facility], [place] [address] [place], [place] [address]    Aerobic/Anaerobic Culture w Gram Stain (surgical/deep wound)     Status: None (Preliminary result)   Collection Time: 11/07/20  3:03 PM  Specimen: PATH Cytology FNA; Body Fluid  Result Value Ref Range Status   Specimen Description FLUID PANCREATIC CYST DRAINAGE  Final   Special Requests A  Final   Gram Stain NO WBC SEEN RARE YEAST   Final   Culture   Final    CULTURE REINCUBATED FOR BETTER GROWTH Performed at [facility], [place] [address] [place], [place] [address]    Report Status PENDING  Incomplete      Imaging Studies   No results found.   Medications   Scheduled Meds: . feeding supplement  237 mL Oral TID BM  . heparin  5,000 Units Subcutaneous Q8H  . multivitamin with minerals  1 tablet Oral Daily  . polyethylene glycol  17 g Oral Daily  . risperiDONE  1 mg Oral QHS  . senna-docusate  1 tablet Oral BID  . traZODone  150 mg Oral QHS   Continuous Infusions: . meropenem (MERREM) IV 1 g (11/08/20 0552)       LOS: 7 days    Time spent: 25 minutes with > 50% spent at bedside and in coordination of care.    Ezekiel Slocumb, DO [facility]  11/08/2020, 12:36 PM      If 7PM-7AM, please contact night-coverage. How to contact the [facility] Attending or Consulting provider [place] or covering provider during after hours [place], for this patient?    1. Check the care team in [facility] and look for a) attending/consulting [facility] provider listed and b) the [facility] team listed 2. Log into [email or web address] and use [place]'s universal password to access. If you do not have the password, please contact the hospital operator. 3. Locate the [facility] provider you are looking for under [facility] and page to a number that you can be directly reached. 4. If you still have difficulty reaching the provider, please page the [facility] (Director on Call) for the Hospitalists listed on amion for assistance.

## 2020-11-08 NOTE — H&P (View-Only) (Signed)
Attending physician's note   I have taken an interval history, reviewed the chart and examined the patient. I agree with the Advanced Practitioner's note, impression, and recommendations as outlined.   Patient feeling better today after procedures by Dr. Rush Landmark completed yesterday.  Liver enzymes and bilirubin downtrending.  T bili 1.6 (from 4.7 yesterday).  Improvement in pain and serologic studies certainly encouraging for improved biliary drainage.  - N.p.o. at midnight - Plan for repeat endoscopic intervention tomorrow with necrosectomy, repeat ERCP with stent retrieval  Gerrit Heck, DO, FACG [phone] office                                                                      Daily Rounding Note  11/08/2020, 11:04 AM  LOS: 7 days   SUBJECTIVE:   Chief complaint: Infected pancreatic pseudocyst.  Post ERCP pancreatitis  Abdominal pain is better.  However still requiring analgesics.  Walked in the hallway this morning.  No nausea or vomiting Overall feels a lot better.  JP drain output less than it was Tolerating clear liquids  OBJECTIVE:         Vital signs in last 24 hours:    Temp:  [98.2 F (36.8 C)-98.9 F (37.2 C)] 98.2 F (36.8 C) (05/06 0734) Pulse Rate:  [83-114] 91 (05/06 0734) Resp:  [17-28] 19 (05/06 0734) BP: (121-178)/(81-96) 121/82 (05/06 0734) SpO2:  [91 %-100 %] 99 % (05/06 0734) Weight:  [118.9 kg] 118.9 kg (05/06 0340) Last BM Date: 11/05/20 Filed Weights   11/06/20 0109 11/08/20 0340  Weight: 120.3 kg 118.9 kg   General: Patient looks well.  Comfortable. Heart: RRR. Chest: Clear bilaterally Abdomen: Obese, soft, epigastric/right upper quadrant tenderness, mild.  Active bowel sounds Extremities: No CCE Neuro/Psych: Alert.  Oriented x3.  Fluid speech.  Calm.  Normal affect.  Intake/Output from previous day: 05/05 0701 - 05/06 0700 In: [place] [P.O.:720; I.V.:800; IV Piggyback:200] Out: 320 [Urine:200; Drains:120]  Intake/Output  this shift: Total I/O In: -  Out: 200 [Urine:200]  Lab Results: Recent Labs    11/06/20 0130 11/07/20 0048 11/08/20 0146  WBC 12.8* 12.2* 12.0*  HGB 11.8* 11.4* 10.5*  HCT 35.8* 35.2* 32.1*  PLT 328 327 344   BMET Recent Labs    11/06/20 0130 11/07/20 0048 11/08/20 0146  NA 134* 134* 135  K 3.6 4.3 4.3  CL 99 98 98  CO2 27 28 30   GLUCOSE 113* 115* 118*  BUN 12 13 12   CREATININE 0.84 0.74 0.69  CALCIUM 8.1* 8.2* 8.0*   LFT Recent Labs    11/06/20 0130 11/07/20 0048 11/08/20 0146  PROT 5.6* 5.6* 5.1*  ALBUMIN 1.8* 1.6* 1.4*  AST 60* 36 23  ALT 111* 80* 48*  ALKPHOS 1,138* 1,007* 692*  BILITOT 8.1* 4.7* 1.6*   PT/INR No results for input(s): LABPROT, INR in the last 72 hours. Hepatitis Panel No results for input(s): HEPBSAG, HCVAB, HEPAIGM, HEPBIGM in the last 72 hours.  Studies/Results: No results found.  ASSESMENT:   *   Acute pancreatitis, initially diagnosed post ERCP pancreatitis in early April.  Developed necrotic, compl pancreatic cyst 11/07/2020 upper EUS with duodenal deformity due to large 113  X 90 mm pseudocyst with walled off necrosis causing extrinsic compression.  Previous biliary stent not placed, it has migrated inwards due to compression from the cyst.  Cyst gastrostomy created with Axios stent, partial necrosectomy performed.  Double-pigtail stents placed. Incidental findings of grade B esophagitis and gastric erythema, gastric biopsies performed.  *   10/09/2020 cholecystectomy of macerated gallbladder.  Postop bile leak.  Placement JP drain.   Post ERCP  pancreatitis.   PLAN   *   11/10/2018 2 repeat EGD with necrosectomy and attempt ERCP and retrieval of biliary stent.   *  FL diet today, NPO after MN Hold heparin after midnight.   [name]  11/08/2020, 11:04 AM Phone [phone]

## 2020-11-09 ENCOUNTER — Inpatient Hospital Stay (HOSPITAL_COMMUNITY): Payer: Medicaid Other

## 2020-11-09 ENCOUNTER — Encounter (HOSPITAL_COMMUNITY): Payer: Self-pay

## 2020-11-09 ENCOUNTER — Inpatient Hospital Stay (HOSPITAL_COMMUNITY): Payer: Medicaid Other | Admitting: Anesthesiology

## 2020-11-09 ENCOUNTER — Encounter (HOSPITAL_COMMUNITY)
Admission: RE | Disposition: A | Discharge status: Home / Self Care | Payer: Self-pay | Source: Ambulatory Visit | Attending: Internal Medicine

## 2020-11-09 DIAGNOSIS — K209 Esophagitis, unspecified without bleeding: Secondary | ICD-10-CM | POA: Diagnosis not present

## 2020-11-09 DIAGNOSIS — K8309 Other cholangitis: Secondary | ICD-10-CM | POA: Diagnosis not present

## 2020-11-09 DIAGNOSIS — Z9889 Other specified postprocedural states: Secondary | ICD-10-CM

## 2020-11-09 DIAGNOSIS — T859XXD Unspecified complication of internal prosthetic device, implant and graft, subsequent encounter: Secondary | ICD-10-CM | POA: Diagnosis not present

## 2020-11-09 DIAGNOSIS — Z4659 Encounter for fitting and adjustment of other gastrointestinal appliance and device: Secondary | ICD-10-CM

## 2020-11-09 DIAGNOSIS — Z978 Presence of other specified devices: Secondary | ICD-10-CM

## 2020-11-09 DIAGNOSIS — K3189 Other diseases of stomach and duodenum: Secondary | ICD-10-CM

## 2020-11-09 DIAGNOSIS — K838 Other specified diseases of biliary tract: Secondary | ICD-10-CM

## 2020-11-09 DIAGNOSIS — T85520A Displacement of bile duct prosthesis, initial encounter: Secondary | ICD-10-CM | POA: Diagnosis not present

## 2020-11-09 DIAGNOSIS — K862 Cyst of pancreas: Secondary | ICD-10-CM | POA: Diagnosis not present

## 2020-11-09 HISTORY — PX: ENDOSCOPIC RETROGRADE CHOLANGIOPANCREATOGRAPHY (ERCP) WITH PROPOFOL: SHX5810

## 2020-11-09 HISTORY — PX: BILIARY STENT PLACEMENT: SHX5538

## 2020-11-09 HISTORY — PX: ESOPHAGOGASTRODUODENOSCOPY (EGD) WITH PROPOFOL: SHX5813

## 2020-11-09 HISTORY — PX: STENT REMOVAL: SHX6421

## 2020-11-09 HISTORY — PX: WOUND DEBRIDEMENT: SHX247

## 2020-11-09 LAB — CBC
HCT: 33.4 % — ABNORMAL LOW (ref 39.0–52.0)
Hemoglobin: 10.6 g/dL — ABNORMAL LOW (ref 13.0–17.0)
MCH: 30 pg (ref 26.0–34.0)
MCHC: 31.7 g/dL (ref 30.0–36.0)
MCV: 94.6 fL (ref 80.0–100.0)
Platelets: 413 10*3/uL — ABNORMAL HIGH (ref 150–400)
RBC: 3.53 MIL/uL — ABNORMAL LOW (ref 4.22–5.81)
RDW: 14.3 % (ref 11.5–15.5)
WBC: 14.8 10*3/uL — ABNORMAL HIGH (ref 4.0–10.5)
nRBC: 0 % (ref 0.0–0.2)

## 2020-11-09 LAB — GLUCOSE, CAPILLARY
Glucose-Capillary: 115 mg/dL — ABNORMAL HIGH (ref 70–99)
Glucose-Capillary: 117 mg/dL — ABNORMAL HIGH (ref 70–99)
Glucose-Capillary: 120 mg/dL — ABNORMAL HIGH (ref 70–99)
Glucose-Capillary: 122 mg/dL — ABNORMAL HIGH (ref 70–99)
Glucose-Capillary: 133 mg/dL — ABNORMAL HIGH (ref 70–99)
Glucose-Capillary: 159 mg/dL — ABNORMAL HIGH (ref 70–99)
Glucose-Capillary: 166 mg/dL — ABNORMAL HIGH (ref 70–99)
Glucose-Capillary: 176 mg/dL — ABNORMAL HIGH (ref 70–99)

## 2020-11-09 LAB — COMPREHENSIVE METABOLIC PANEL
ALT: 41 U/L (ref 0–44)
AST: 25 U/L (ref 15–41)
Albumin: 1.5 g/dL — ABNORMAL LOW (ref 3.5–5.0)
Alkaline Phosphatase: 546 U/L — ABNORMAL HIGH (ref 38–126)
Anion gap: 8 (ref 5–15)
BUN: 10 mg/dL (ref 6–20)
CO2: 29 mmol/L (ref 22–32)
Calcium: 7.8 mg/dL — ABNORMAL LOW (ref 8.9–10.3)
Chloride: 98 mmol/L (ref 98–111)
Creatinine, Ser: 0.66 mg/dL (ref 0.61–1.24)
GFR, Estimated: >60 mL/min (ref >60)
Glucose, Bld: 122 mg/dL — ABNORMAL HIGH (ref 70–99)
Potassium: 4.2 mmol/L (ref 3.5–5.1)
Sodium: 135 mmol/L (ref 135–145)
Total Bilirubin: 1.4 mg/dL — ABNORMAL HIGH (ref 0.3–1.2)
Total Protein: 5.2 g/dL — ABNORMAL LOW (ref 6.5–8.1)

## 2020-11-09 SURGERY — ESOPHAGOGASTRODUODENOSCOPY (EGD) WITH PROPOFOL
Anesthesia: General

## 2020-11-09 MED ORDER — ROCURONIUM BROMIDE 10 MG/ML (PF) SYRINGE
PREFILLED_SYRINGE | INTRAVENOUS | Status: DC | PRN
  Administered 2020-11-09: 60 mg via INTRAVENOUS

## 2020-11-09 MED ORDER — ONDANSETRON HCL 4 MG/2ML IJ SOLN
INTRAMUSCULAR | Status: DC | PRN
  Administered 2020-11-09: 4 mg via INTRAVENOUS

## 2020-11-09 MED ORDER — GLUCAGON HCL RDNA (DIAGNOSTIC) 1 MG IJ SOLR
INTRAMUSCULAR | Status: DC | PRN
  Administered 2020-11-09 (×2): .25 mg via INTRAVENOUS

## 2020-11-09 MED ORDER — PROPOFOL 10 MG/ML IV BOLUS
INTRAVENOUS | Status: DC | PRN
  Administered 2020-11-09: 140 mg via INTRAVENOUS

## 2020-11-09 MED ORDER — MIDAZOLAM HCL 5 MG/5ML IJ SOLN
INTRAMUSCULAR | Status: DC | PRN
  Administered 2020-11-09: 2 mg via INTRAVENOUS

## 2020-11-09 MED ORDER — GLUCAGON HCL RDNA (DIAGNOSTIC) 1 MG IJ SOLR
INTRAMUSCULAR | Status: AC
  Filled 2020-11-09: qty 1

## 2020-11-09 MED ORDER — FENTANYL CITRATE (PF) 100 MCG/2ML IJ SOLN
INTRAMUSCULAR | Status: DC | PRN
  Administered 2020-11-09: 100 ug via INTRAVENOUS

## 2020-11-09 MED ORDER — LACTATED RINGERS IV SOLN
INTRAVENOUS | Status: DC
  Administered 2020-11-09: 1000 mL via INTRAVENOUS

## 2020-11-09 MED ORDER — LIDOCAINE 2% (20 MG/ML) 5 ML SYRINGE
INTRAMUSCULAR | Status: DC | PRN
  Administered 2020-11-09: 40 mg via INTRAVENOUS

## 2020-11-09 MED ORDER — INDOMETHACIN 50 MG RE SUPP
RECTAL | Status: AC
  Filled 2020-11-09: qty 2

## 2020-11-09 SURGICAL SUPPLY — 15 items

## 2020-11-09 NOTE — Interval H&P Note (Signed)
History and Physical Interval Note:  11/09/2020 6:28 AM  Steve Romero  has presented today for surgery, with the diagnosis of pancreatic pseudocyst, pancreatic necrosis.  The various methods of treatment have been discussed with the patient and family. After consideration of risks, benefits and other options for treatment, the patient has consented to  Procedure(s): ESOPHAGOGASTRODUODENOSCOPY (EGD) WITH PROPOFOL (N/A) ENDOSCOPIC RETROGRADE CHOLANGIOPANCREATOGRAPHY (ERCP) WITH PROPOFOL (N/A) as a surgical intervention.  The patient's history has been reviewed, patient examined, no change in status, stable for surgery.  I have reviewed the patient's chart and labs.  Questions were answered to the patient's satisfaction.    The risks of an ERCP were discussed at length, including but not limited to the risk of perforation, bleeding, abdominal pain, post-ERCP pancreatitis (while usually mild can be severe and even life threatening).  Plan will be for just ERCP if we feel that we can reobtain biliary access and replace stent for history of bile leak.  May perform necrosectomy today as well, we will see.   [employer]

## 2020-11-09 NOTE — Progress Notes (Signed)
Pt came back to rm 5 from endo. Reinitiated tele. VSS. Call bell within reach.   Lavenia Atlas, RN

## 2020-11-09 NOTE — Anesthesia Postprocedure Evaluation (Signed)
Anesthesia Post Note  Patient: Ardie Mclennan  Procedure(s) Performed: ESOPHAGOGASTRODUODENOSCOPY (EGD) WITH PROPOFOL (N/A ) STENT REMOVAL BILIARY STENT PLACEMENT DEBRIDEMENT of Pancreatic cyst ENDOSCOPIC RETROGRADE CHOLANGIOPANCREATOGRAPHY (ERCP) WITH PROPOFOL (N/A )     Patient location during evaluation: PACU Anesthesia Type: General Level of consciousness: awake and alert Pain management: pain level controlled Vital Signs Assessment: post-procedure vital signs reviewed and stable Respiratory status: spontaneous breathing, nonlabored ventilation, respiratory function stable and patient connected to nasal cannula oxygen Cardiovascular status: blood pressure returned to baseline and stable Postop Assessment: no apparent nausea or vomiting Anesthetic complications: no   No complications documented.  Last Vitals:  Vitals:   11/09/20 1240 11/09/20 1252  BP: (!) 142/89 (!) 141/79  Pulse: 91 88  Resp: 13 20  Temp:  36.7 C  SpO2: 100% 100%    Last Pain:  Vitals:   11/09/20 1253  TempSrc:   PainSc: 7                  Effie Berkshire

## 2020-11-09 NOTE — Op Note (Signed)
[facility] Patient Name: Steve Romero Procedure Date : 11/09/2020 MRN: [id number] Attending MD: Justice Britain , MD Date of Birth: 01/07/1971 CSN: [id number] Age: 50 Admit Type: Inpatient Procedure:                Upper GI endoscopy Indications:              Pancreatic necrosis Providers:                Justice Britain, MD, Kary Kos RN, RN, Cletis Athens, Technician, Claybon Jabs CRNA, CRNA Referring MD:             Lanell Matar. Dondra Spry, MD, [facility] Medicines:                General Anesthesia Complications:            No immediate complications. Estimated Blood Loss:     Estimated blood loss was minimal. Procedure:                Pre-Anesthesia Assessment:                           - Prior to the procedure, a History and Physical                            was performed, and patient medications and                            allergies were reviewed. The patient's tolerance of                            previous anesthesia was also reviewed. The risks                            and benefits of the procedure and the sedation                            options and risks were discussed with the patient.                            All questions were answered, and informed consent                            was obtained. Prior Anticoagulants: The patient has                            taken heparin, last dose was 1 day prior to                            procedure. ASA Grade Assessment: III - A patient  with severe systemic disease. After reviewing the                            risks and benefits, the patient was deemed in                            satisfactory condition to undergo the procedure.                           After obtaining informed consent, the endoscope was                            passed under direct vision. Throughout the                             procedure, the patient's blood pressure, pulse, and                            oxygen saturations were monitored continuously. The                            [id number] ([id number]) Olympus therapeutic                            gastroscope was introduced through the mouth, and                            advanced to the second part of duodenum. The upper                            GI endoscopy was accomplished without difficulty.                            The patient tolerated the procedure. Findings:      No gross lesions were noted in the proximal esophagus and in the mid       esophagus.      LA Grade C (one or more mucosal breaks continuous between tops of 2 or       more mucosal folds, less than 75% circumference) esophagitis was found       in the distal esophagus.      Patchy moderately erythematous mucosa without bleeding was found in the       entire examined stomach.      A previously placed AXIOS cystgastrostomy stent as well 2 double       pigtails were in place and was found at the incisura. The two double       pigtail stents were removed from the cystgastrostomy with a rat-toothed       forceps. The AXIOS was entered, but complete entrance into the cyst was       not possible due to the angulation of the incisura. The cyst was       completely filled with fluid and black necrotic tissue that was pasty       and adherent to the cyst wall. Necrosectomy was performed with a       combination of rat-toothed forceps,  snare and Endorotor Powered       Endoscopic Debrider, requiring numerous intubations of the cyst. Total       time for Necrosectomy was 50 minutes. At the conclusion of the       procedure, a medium amount of necrotic tissue and a medium amount of       pink, viable tissue was found within the pseudocyst on direct vision.       Two double pigtail stents (10 French x 7 cm & 7 French x 7 cm) were       inserted across the AXIOS through the cystgastrostomy  tract.      A moderate extrinsic deformity was found in the duodenal bulb, in the       first portion of the duodenum and in the second portion of the duodenum       - improved from prior. Impression:               - No gross lesions in esophagus proximally. LA                            Grade C esophagitis distally.                           - Erythematous mucosa in the stomach - previously                            biopsied.                           - Pre-existing AXIOS cystgastrostomy stent in place                            with double pigtails. Necrosectomy was performed.                            Double pigtails replaced.                           - Duodenal extrinsic deformity - improved from                            prior. Recommendation:           - The patient will be observed post-procedure,                            until all discharge criteria are met.                           - Return patient to hospital ward for ongoing care.                           - Advance diet as tolerated.                           - Continue antibiotics.                           -  Observe patient's clinical course.                           - Repeat Necrosectomy on 5/9 PM vs later in week                            pending my availability and Anesthesia availability.                           - KUB tomorrow.                           - Continue to hold PPI.                           - If concerns of bleeding occur, please contact GI                            and consider repeat cross-sectional imaging with                            CTAbdomen-Angiography.                           - The findings and recommendations were discussed                            with the patient.                           - The findings and recommendations were discussed                            with the referring physician. Procedure Code(s):        --- Professional ---                           [phone],  Esophagogastroduodenoscopy, flexible,                            transoral; diagnostic, including collection of                            specimen(s) by brushing or washing, when performed                            (separate procedure)                           [phone], Unlisted procedure, pancreas Diagnosis Code(s):        --- Professional ---                           K20.90, Esophagitis, unspecified without bleeding                           K31.89, Other diseases of stomach and duodenum  Z97.8, Presence of other specified devices                           K86.89, Other specified diseases of pancreas CPT copyright 2019 American Medical Association. All rights reserved. The codes documented in this report are preliminary and upon coder review may  be revised to meet current compliance requirements. Justice Britain, MD 11/09/2020 12:12:52 PM Number of Addenda: 0

## 2020-11-09 NOTE — Progress Notes (Signed)
A tick was removed from the patient's back when found during ERCP.  This has been placed in a jar. Will alert the primary medical service. The area of the tick bite has been marked.  Justice Britain, MD [facility]oscopy Office # [phone]

## 2020-11-09 NOTE — Progress Notes (Signed)
PROGRESS NOTE    Steve Romero   [id number]  DOB: 02-Jan-1971  PCP: [name], DO    DOA: 11/01/2020 LOS: 8   Brief Narrative   Steve Romero is a 50 y.o. male with medical history significant for COPD, bipolar disorder, schizoaffective disorder and depression presented to the ED with persistent right-sided abdominal pain, N/V, poor appetite and early satiety.  Also noted increased JP drain output.   Initially admitted at [facility] on 4/29, transferred to [facility] for GI intervention.  Recent History: 10/09/20 cholecystectomy complicated by postop bile leak due to torn cystic duct that was clipped.  JP drain placed. 10/10/2020 ERCP with sphincterotomy and placement of plastic stent.  Developed post ERCP pancreatitis. Plan was for repeat ERCP and likely stent removal in 6 weeks.   Discharged after 4/6 - 4/13 admission.  Readmitted 4/29 from surgeons's office.   Pt has developed large, complex fluid collection at the pancreatic head, uncinate, proximal body.  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  The necrotic collection is causing mass-effect on the gastric antrum/pylorus/proximal duodenum with reactive small bowel thickening.  Stent in stable position.  Intrapelvic bile duct dilation is mild to moderate likely due to mass-effect from the fluid collection.  There is fluid edema surrounding the pancreas extending into the retroperitoneum and mesentery.  Reactive thickening in the colon.     Assessment & Plan   Principal Problem:   Disorder of bile duct stent Active Problems:   Pancreatic necrosis   Elevated LFTs   Ascending cholangitis   Protein-calorie malnutrition, severe   Post-ERCP Pancreatitis with necrotic fluid collection Bile leak status post cholecystectomy 4/6 -  JP drain in place. Initially admitted at [facility]. General surgery there Dr. Constance Haw d/w Dr. Rush Landmark who advised transfer to [facility] for  possible advanced ERCP. 5/5 ERCP / EUS - see Op Note findings.  Biliary stent had migrated due to pressure from enlarging cyst. Partial necrosectomy was performed and stent dilated to relieve obstruction.   5/6 LFT's improved, jaundice resolved 5/7 ERCP - see op note. Prior stent accessible now and new one placed. Partial necrosectomy --GI following,  Dr. Rush Landmark - see his recs --Possible repeat ERCP for further necrosectomy Mon or Tue --Diet per GI --Continue empiric Merrem --Follow CBC --RD consult for supplement recs --Pain control, antiemetics PRN --Follow cultures from 5/5  COPD - stable, without no exacerbation sx's.   --Duonebs PRN  Schizoaffective disorder, Depression - stable. --resume Risperdal, trazodone  Obesity: Body mass index is 38.19 kg/m.   Complicates overall care and prognosis.  Recommend lifestyle modifications including physical activity and diet for weight loss and overall long-term health.   DVT prophylaxis: heparin injection 5,000 Units Start: 11/06/20 0600   Diet:  Diet Orders (From admission, onward)    Start     Ordered   11/09/20 1256  Diet full liquid Room service appropriate? Yes; Fluid consistency: Thin  Diet effective now       Question Answer Comment  Room service appropriate? Yes   Fluid consistency: Thin      11/09/20 1256            Code Status: Full Code    Subjective 11/09/20    Pt seated edge of bed, seen after ERCP this AM.  Family at bedside.  Pt feeling well.  Tick found on his shoulder at procedure.  Pt denies recently feeling ill, having any rashes, myalgia, fever/chills, or other symptoms aside from  abdominal pain  Disposition Plan & Communication   Status is: Inpatient  Inpatient status appropriate due to severity of illness and ongoing diagnostic evaluation not appropriate for outpatient setting. Additional procedure/s planned.  Dispo: The patient is from: home              Anticipated d/c is to: home               Patient currently not medically stable for d/c   Difficult to place patient no   Consults, Procedures, Significant Events   Consultants:   Gastroenterology, Dr. Rush Landmark  Procedures:   5/5 - ERCP  5/7 - ERCP    Antimicrobials:  Anti-infectives (From admission, onward)   Start     Dose/Rate Route Frequency Ordered Stop   11/01/20 2200  piperacillin-tazobactam (ZOSYN) IVPB 3.375 g  Status:  Discontinued        3.375 g 12.5 mL/hr over 240 Minutes Intravenous Every 8 hours 11/01/20 1335 11/01/20 1406   11/01/20 1500  meropenem (MERREM) 1 g in sodium chloride 0.9 % 100 mL IVPB        1 g 200 mL/hr over 30 Minutes Intravenous Every 8 hours 11/01/20 1413     11/01/20 1430  piperacillin-tazobactam (ZOSYN) IVPB 3.375 g  Status:  Discontinued        3.375 g 100 mL/hr over 30 Minutes Intravenous  Once 11/01/20 1353 11/01/20 1406        Micro    Objective   Vitals:   11/09/20 1217 11/09/20 1220 11/09/20 1240 11/09/20 1252  BP: (!) 159/92 (!) 159/92 (!) 142/89 (!) 141/79  Pulse: (!) 108 (!) 109 91 88  Resp: (!) 23 (!) 24 13 20   Temp: 98.5 F (36.9 C)   98 F (36.7 C)  TempSrc: Oral   Oral  SpO2: 94% 93% 100% 100%  Weight:      Height:        Intake/Output Summary (Last 24 hours) at 11/09/2020 1514 Last data filed at 11/09/2020 1151 Gross per 24 hour  Intake 500 ml  Output 140 ml  Net 360 ml   Filed Weights   11/06/20 0109 11/08/20 0340 11/09/20 0353  Weight: 120.3 kg 118.9 kg 119 kg    Physical Exam:  General exam: awake, alert, no acute distress, obese Respiratory system: CTAB, no wheezes, rales or rhonchi, normal respiratory effort. Cardiovascular system: normal S1/S2, RRR, no pedal edema.   Gastrointestinal system: distended abdomen, JP drain empty Central nervous system: A&O x3. Normal speech, CN's grossly intact Skin: posterior R shoulder with outlined pinpoint of erythema where tick was found and removed - no surrounding erythema warmth or swelling, no  jaundice  Labs   Data Reviewed: I have personally reviewed following labs and imaging studies  CBC: Recent Labs  Lab 11/03/20 0444 11/04/20 2440 11/04/20 2035 11/05/20 0609 11/06/20 0130 11/07/20 0048 11/08/20 0146 11/09/20 0036  WBC 11.7* 10.5 11.5* 13.5* 12.8* 12.2* 12.0* 14.8*  NEUTROABS 8.6* 8.0* 9.7* 11.6* 10.9*  --   --   --   HGB 12.8* 12.1* 12.2* 12.4* 11.8* 11.4* 10.5* 10.6*  HCT 40.6 39.3 38.4* 38.7* 35.8* 35.2* 32.1* 33.4*  MCV 94.2 96.3 94.3 93.9 90.9 92.1 92.2 94.6  PLT 338 342 335 335 328 327 344 102*   Basic Metabolic Panel: Recent Labs  Lab 11/05/20 0609 11/06/20 0130 11/07/20 0048 11/08/20 0146 11/09/20 0036  NA 133* 134* 134* 135 135  K 3.7 3.6 4.3 4.3 4.2  CL 98 99 98  98 98  CO2 26 27 28 30 29   GLUCOSE 128* 113* 115* 118* 122*  BUN 15 12 13 12 10   CREATININE 0.74 0.84 0.74 0.69 0.66  CALCIUM 8.1* 8.1* 8.2* 8.0* 7.8*   GFR: Estimated Creatinine Clearance: 143.3 mL/min (by C-G formula based on SCr of 0.66 mg/dL). Liver Function Tests: Recent Labs  Lab 11/05/20 0609 11/06/20 0130 11/07/20 0048 11/08/20 0146 11/09/20 0036  AST 67* 60* 36 23 25  ALT 149* 111* 80* 48* 41  ALKPHOS 1,127* 1,138* 1,007* 692* 546*  BILITOT 6.5* 8.1* 4.7* 1.6* 1.4*  PROT 6.0* 5.6* 5.6* 5.1* 5.2*  ALBUMIN 2.2* 1.8* 1.6* 1.4* 1.5*   Recent Labs  Lab 11/03/20 0444 11/04/20 0633 11/05/20 0609  LIPASE 100* 74* 73*   No results for input(s): AMMONIA in the last 168 hours. Coagulation Profile: No results for input(s): INR, PROTIME in the last 168 hours. Cardiac Enzymes: No results for input(s): CKTOTAL, CKMB, CKMBINDEX, TROPONINI in the last 168 hours. BNP (last 3 results) No results for input(s): PROBNP in the last 8760 hours. HbA1C: No results for input(s): HGBA1C in the last 72 hours. CBG: Recent Labs  Lab 11/09/20 0039 11/09/20 0355 11/09/20 0613 11/09/20 0805 11/09/20 1301  GLUCAP 133* 117* 115* 122* 120*   Lipid Profile: No results for  input(s): CHOL, HDL, LDLCALC, TRIG, CHOLHDL, LDLDIRECT in the last 72 hours. Thyroid Function Tests: No results for input(s): TSH, T4TOTAL, FREET4, T3FREE, THYROIDAB in the last 72 hours. Anemia Panel: No results for input(s): VITAMINB12, FOLATE, FERRITIN, TIBC, IRON, RETICCTPCT in the last 72 hours. Sepsis Labs: Recent Labs  Lab 11/05/20 2319  LATICACIDVEN 1.2    Recent Results (from the past 240 hour(s))  SARS CORONAVIRUS 2 (TAT 6-24 HRS) Nasopharyngeal Nasopharyngeal Swab     Status: None   Collection Time: 11/01/20  5:27 PM   Specimen: Nasopharyngeal Swab  Result Value Ref Range Status   SARS Coronavirus 2 NEGATIVE NEGATIVE Final    Comment: (NOTE) SARS-CoV-2 target nucleic acids are NOT DETECTED.  The SARS-CoV-2 RNA is generally detectable in upper and lower respiratory specimens during the acute phase of infection. Negative results do not preclude SARS-CoV-2 infection, do not rule out co-infections with other pathogens, and should not be used as the sole basis for treatment or other patient management decisions. Negative results must be combined with clinical observations, patient history, and epidemiological information. The expected result is Negative.  Fact Sheet for Patients: SugarRoll.be  Fact Sheet for Healthcare Providers: https://www.woods-mathews.com/  This test is not yet approved or cleared by the [place] FDA and  has been authorized for detection and/or diagnosis of SARS-CoV-2 by FDA under an Emergency Use Authorization (EUA). This EUA will remain  in effect (meaning this test can be used) for the duration of the COVID-19 declaration under Se ction 564(b)(1) of the Act, 21 U.S.C. section 360bbb-3(b)(1), unless the authorization is terminated or revoked sooner.  Performed at [facility], [place] [address] [place], [place] [address]   Aerobic/Anaerobic Culture w Gram Stain (surgical/deep wound)      Status: None (Preliminary result)   Collection Time: 11/07/20  3:03 PM   Specimen: PATH Cytology FNA; Body Fluid  Result Value Ref Range Status   Specimen Description FLUID PANCREATIC CYST DRAINAGE  Final   Special Requests A  Final   Gram Stain NO WBC SEEN RARE YEAST   Final   Culture   Final    CULTURE REINCUBATED FOR BETTER GROWTH Performed at [facility]  [facility], [place] [address] [place], [place] [address]    Report Status PENDING  Incomplete      Imaging Studies   No results found.   Medications   Scheduled Meds: . feeding supplement  237 mL Oral TID BM  . indomethacin  100 mg Rectal Once  . multivitamin with minerals  1 tablet Oral Daily  . polyethylene glycol  17 g Oral Daily  . risperiDONE  1 mg Oral QHS  . senna-docusate  1 tablet Oral BID  . traZODone  150 mg Oral QHS   Continuous Infusions: . meropenem (MERREM) IV 1 g (11/09/20 1314)       LOS: 8 days    Time spent: 25 minutes with > 50% spent at bedside and in coordination of care.    Ezekiel Slocumb, DO [facility]  11/09/2020, 3:14 PM      If 7PM-7AM, please contact night-coverage. How to contact the [facility] Attending or Consulting provider [place] or covering provider during after hours [place], for this patient?    1. Check the care team in [facility] and look for a) attending/consulting [facility] provider listed and b) the [facility] team listed 2. Log into [email or web address] and use [place]'s universal password to access. If you do not have the password, please contact the hospital operator. 3. Locate the [facility] provider you are looking for under [facility] and page to a number that you can be directly reached. 4. If you still have difficulty reaching the provider, please page the [facility] (Director on Call) for the Hospitalists listed on amion for assistance.

## 2020-11-09 NOTE — Progress Notes (Signed)
Pt's drain dressing is saturated, change and secured with ABD PAD.

## 2020-11-09 NOTE — Transfer of Care (Signed)
Immediate Anesthesia Transfer of Care Note  Patient: Steve Romero  Procedure(s) Performed: ESOPHAGOGASTRODUODENOSCOPY (EGD) WITH PROPOFOL (N/A ) STENT REMOVAL BILIARY STENT PLACEMENT ENDOSCOPIC RETROGRADE CHOLANGIOPANCREATOGRAPHY (ERCP) WITH PROPOFOL (N/A )  Patient Location: Endoscopy Unit  Anesthesia Type:General  Level of Consciousness: drowsy and patient cooperative  Airway & Oxygen Therapy: Patient Spontanous Breathing and Patient connected to nasal cannula oxygen  Post-op Assessment: Report given to RN, Post -op Vital signs reviewed and stable and Patient moving all extremities  Post vital signs: Reviewed and stable  Last Vitals:  Vitals Value Taken Time  BP 164/90 11/09/20 1155  Temp    Pulse 111 11/09/20 1155  Resp 19 11/09/20 1155  SpO2 93 % 11/09/20 1155    Last Pain:  Vitals:   11/09/20 1155  TempSrc:   PainSc: 0-No pain      Patients Stated Pain Goal: 0 (72/09/47 0962)  Complications: No complications documented.

## 2020-11-09 NOTE — Progress Notes (Signed)
Pharmacy Antibiotic Note  Steve Romero is a 50 y.o. male admitted at [facility] on 11/01/2020 with intra-abdominal infection/necrotizing pancreatitis. Pharmacy consulted was consulted for empiric meropenem dosing starting on 11/01/20. Patient was transferred on 11/05/20 to [facility] for advanced ERCP intervention.   Patient is now s/p EGD with necrosectomy and ERCP, afebrile, WBC 14.8, renal function stable. Per GI, continue antibiotics for now with plans for repeat necrosectomy later next week.  Plan: Meropenem 1g IV every 8 hours Monitor C&S, renal function, GI plans  Height: 5' 9.5" (176.5 cm) Weight: 119 kg (262 lb 5.4 oz) IBW/kg (Calculated) : 71.85  Temp (24hrs), Avg:98.3 F (36.8 C), Min:97.5 F (36.4 C), Max:98.9 F (37.2 C)  Recent Labs  Lab 11/05/20 0609 11/05/20 2319 11/06/20 0130 11/07/20 0048 11/08/20 0146 11/09/20 0036  WBC 13.5*  --  12.8* 12.2* 12.0* 14.8*  CREATININE 0.74  --  0.84 0.74 0.69 0.66  LATICACIDVEN  --  1.2  --   --   --   --     Estimated Creatinine Clearance: 143.3 mL/min (by C-G formula based on SCr of 0.66 mg/dL).    Allergies  Allergen Reactions  . Desipramine Nausea Only and Rash    Antimicrobials this admission: Merrem 4/29 >>   Microbiology results:   5/5 pancreatic cyst fluid cx: no WBC, rare yeast - pending  Thank you for allowing pharmacy to be a part of this patient's care.  Romilda Garret, PharmD PGY1 Acute Care Pharmacy Resident 11/09/2020 1:54 PM  Please check [email or web address] for unit specific pharmacy phone numbers.

## 2020-11-09 NOTE — Anesthesia Procedure Notes (Signed)
Procedure Name: Intubation Date/Time: 11/09/2020 9:50 AM Performed by: Moshe Salisbury, CRNA Pre-anesthesia Checklist: Patient identified, Emergency Drugs available, Suction available and Patient being monitored Patient Re-evaluated:Patient Re-evaluated prior to induction Oxygen Delivery Method: Circle System Utilized Preoxygenation: Pre-oxygenation with 100% oxygen Induction Type: IV induction Ventilation: Mask ventilation without difficulty Laryngoscope Size: Mac and 4 Grade View: Grade I Tube type: Oral Tube size: 8.0 mm Number of attempts: 1 Airway Equipment and Method: Stylet Placement Confirmation: ETT inserted through vocal cords under direct vision,  positive ETCO2 and breath sounds checked- equal and bilateral Secured at: 22 cm Tube secured with: Tape Dental Injury: Teeth and Oropharynx as per pre-operative assessment

## 2020-11-09 NOTE — Anesthesia Preprocedure Evaluation (Addendum)
Anesthesia Evaluation  Patient identified by MRN, date of birth, ID band Patient awake    Reviewed: Allergy & Precautions, NPO status , Patient's Chart, lab work & pertinent test results  Airway Mallampati: I  TM Distance: <3 FB Neck ROM: Full    Dental  (+) Edentulous Upper, Edentulous Lower   Pulmonary sleep apnea , COPD, Current Smoker and Patient abstained from smoking.,    breath sounds clear to auscultation       Cardiovascular hypertension,  Rhythm:Regular Rate:Tachycardia     Neuro/Psych PSYCHIATRIC DISORDERS Anxiety Depression Bipolar Disorder Schizophrenia  Neuromuscular disease    GI/Hepatic negative GI ROS, Neg liver ROS,   Endo/Other  negative endocrine ROS  Renal/GU negative Renal ROS     Musculoskeletal negative musculoskeletal ROS (+)   Abdominal Normal abdominal exam  (+)   Peds  Hematology negative hematology ROS (+)   Anesthesia Other Findings   Reproductive/Obstetrics                            Anesthesia Physical Anesthesia Plan  ASA: III  Anesthesia Plan: General   Post-op Pain Management:    Induction: Intravenous  PONV Risk Score and Plan: 1 and Ondansetron and Midazolam  Airway Management Planned: Oral ETT  Additional Equipment: None  Intra-op Plan:   Post-operative Plan: Extubation in OR  Informed Consent: I have reviewed the patients History and Physical, chart, labs and discussed the procedure including the risks, benefits and alternatives for the proposed anesthesia with the patient or authorized representative who has indicated his/her understanding and acceptance.     Dental advisory given  Plan Discussed with: CRNA  Anesthesia Plan Comments:         Anesthesia Quick Evaluation

## 2020-11-09 NOTE — Op Note (Signed)
[facility] Patient Name: Steve Romero Procedure Date : 11/09/2020 MRN: [id number] Attending MD: Justice Britain , MD Date of Birth: [birth year] CSN: [id number] Age: 50 Admit Type: Inpatient Procedure:                ERCP Indications:              Bile leak, Biliary dilation on Computed Tomogram                            Scan, Stent change, Prior Endoscopic Retrograde                            Cholangiopancreatography, Stent removal Providers:                Justice Britain, MD, Kary Kos RN, RN, Cletis Athens, Technician, Claybon Jabs CRNA, CRNA Referring MD:             Lanell Matar. Dondra Spry, MD, [facility] Medicines:                General Anesthesia, Indomethacin 100 mg PR,                            Glucagon 0.5 mg IV Complications:            No immediate complications. Estimated Blood Loss:     Estimated blood loss was minimal. Procedure:                Pre-Anesthesia Assessment:                           - Prior to the procedure, a History and Physical                            was performed, and patient medications and                            allergies were reviewed. The patient's tolerance of                            previous anesthesia was also reviewed. The risks                            and benefits of the procedure and the sedation                            options and risks were discussed with the patient.                            All questions were answered, and informed consent  was obtained. Prior Anticoagulants: The patient has                            taken heparin, last dose was 1 day prior to                            procedure. ASA Grade Assessment: III - A patient                            with severe systemic disease. After reviewing the                            risks and benefits, the patient was deemed in                             satisfactory condition to undergo the procedure.                           After obtaining informed consent, the scope was                            passed under direct vision. Throughout the                            procedure, the patient's blood pressure, pulse, and                            oxygen saturations were monitored continuously. The                            GIF-H190 ([id number]) Olympus gastroscope was                            introduced through the mouth, and used to inject                            contrast into and used to locate the major papilla.                            The TJF- Q180V ([id number]) Olympus duodenoscope was                            introduced through the mouth, and used to inject                            contrast into and used to inject contrast into the                            bile duct. The ERCP was accomplished without                            difficulty. The patient tolerated the procedure. Scope In: Scope Out: Findings:      A  scout film of the abdomen was obtained. Surgical clips, consistent       with previous cholecystectomy, were seen in the area of the right upper       quadrant of the abdomen. One percutaneous drain ending in the Right       upper quadrant was seen. One stent ending in the region of the main bile       duct was seen.      The upper GI tract was traversed under direct vision without detailed       examination. A moderate extrinsic deformity was found in the duodenal       bulb, in the first portion of the duodenum, in the second portion of the       duodenum - improved from prior - such that endoscope could pass and       duodenoscope could partially pass.      The only was to evaluate the major papilla was in a long-poistion as       shortening the scope was not possible with the persistent inflammation       present. A biliary sphincterotomy had been performed. The sphincterotomy       appeared  open. The major papilla was congested and edematous. One stent       which had been placed through the major papilla into the biliary tree       was no longer visible. It had migrated into the duct.      A short 0.035 inch Soft Jagwire was passed into the biliary tree. The       Hydratome sphincterotome was passed over the guidewire and the bile duct       was then deeply cannulated. Contrast was injected. I personally       interpreted the bile duct images. Ductal flow of contrast was adequate.       Image quality was adequate. Contrast extended to the hepatic ducts.       Opacification of the entire biliary tree except for the gallbladder was       successful. The maximum diameter of the ducts was 9 mm. Extravasation of       contrast originating from the cystic duct was observed as well. In an       effort of trying to get the bile duct stent to move downwards and to       discover objects, the biliary tree was swept with a retrieval balloon       starting at the middle third of the main bile duct. The stent moved down       after multiple attempts at pullthrough. The stent was repositioned and       had no evidence of issues in regards to sludge or blockage and so it was       left in place. Sludge was also swept from the duct. One 8.5 Fr by 5 cm       plastic biliary stent with a single external flap and a single internal       flap was placed into the common bile duct. Bile flowed through the       stent. The stent was in good position adjacent to the other. I did this       so that we would have better chance of improving the biliary leak.      A pancreatogram was not performed.      The duodenoscope was withdrawn  from the patient. Impression:               - Duodenal extrinsic deformity.                           - Prior biliary sphincterotomy appeared open. The                            major papilla appeared congested/edematous.                           - A previously placed  stent had migrated into the                            biliary tree.                           - A bile leak was found.                           - A previously placed stent had migrated into the                            biliary tree. This was able to be brought downwards                            after balloon pullthrough.                           - The biliary tree was swept and stent(s) and                            sludge were found.                           - One plastic biliary stent was placed into the                            common bile duct to go side-by-side with the other                            to help with biliary leak. Recommendation:           - Proceed to scheduled EGD with necrosectomy as                            scheduled.                           - Observe patient's clinical course.                           - Monitor drain output from biliary JP drain.                           - Watch for pancreatitis, bleeding, perforation,  and cholangitis.                           - Repeat ERCP in 2 months to exchange stent.                           - The findings and recommendations were discussed                            with the patient.                           - The findings and recommendations were discussed                            with the patient's family.                           - The findings and recommendations were discussed                            with the referring physician. Procedure Code(s):        --- Professional ---                           [phone], Endoscopic retrograde                            cholangiopancreatography (ERCP); with removal and                            exchange of stent(s), biliary or pancreatic duct,                            including pre- and post-dilation and guide wire                            passage, when performed, including sphincterotomy,                            when  performed, each stent exchanged                           43264, Endoscopic retrograde                            cholangiopancreatography (ERCP); with removal of                            calculi/debris from biliary/pancreatic duct(s) Diagnosis Code(s):        --- Professional ---                           K31.89, Other diseases of stomach and duodenum                           K83.8, Other specified  diseases of biliary tract                           T85.520A, Displacement of bile duct prosthesis,                            initial encounter                           K83.9, Disease of biliary tract, unspecified                           Z46.59, Encounter for fitting and adjustment of                            other gastrointestinal appliance and device                           Z98.890, Other specified postprocedural states CPT copyright 2019 [employer]. All rights reserved. The codes documented in this report are preliminary and upon coder review may  be revised to meet current compliance requirements. Justice Britain, MD 11/09/2020 12:12:04 PM Number of Addenda: 0

## 2020-11-10 ENCOUNTER — Inpatient Hospital Stay (HOSPITAL_COMMUNITY): Payer: Medicaid Other

## 2020-11-10 DIAGNOSIS — K8689 Other specified diseases of pancreas: Secondary | ICD-10-CM | POA: Diagnosis not present

## 2020-11-10 DIAGNOSIS — K8309 Other cholangitis: Secondary | ICD-10-CM | POA: Diagnosis not present

## 2020-11-10 DIAGNOSIS — T859XXD Unspecified complication of internal prosthetic device, implant and graft, subsequent encounter: Secondary | ICD-10-CM | POA: Diagnosis not present

## 2020-11-10 DIAGNOSIS — K839 Disease of biliary tract, unspecified: Secondary | ICD-10-CM | POA: Diagnosis not present

## 2020-11-10 LAB — COMPREHENSIVE METABOLIC PANEL
ALT: 33 U/L (ref 0–44)
AST: 23 U/L (ref 15–41)
Albumin: 1.5 g/dL — ABNORMAL LOW (ref 3.5–5.0)
Alkaline Phosphatase: 429 U/L — ABNORMAL HIGH (ref 38–126)
Anion gap: 8 (ref 5–15)
BUN: 11 mg/dL (ref 6–20)
CO2: 32 mmol/L (ref 22–32)
Calcium: 7.8 mg/dL — ABNORMAL LOW (ref 8.9–10.3)
Chloride: 95 mmol/L — ABNORMAL LOW (ref 98–111)
Creatinine, Ser: 0.9 mg/dL (ref 0.61–1.24)
GFR, Estimated: >60 mL/min (ref >60)
Glucose, Bld: 181 mg/dL — ABNORMAL HIGH (ref 70–99)
Potassium: 4.3 mmol/L (ref 3.5–5.1)
Sodium: 135 mmol/L (ref 135–145)
Total Bilirubin: 1.2 mg/dL (ref 0.3–1.2)
Total Protein: 5.4 g/dL — ABNORMAL LOW (ref 6.5–8.1)

## 2020-11-10 LAB — CBC
HCT: 34.1 % — ABNORMAL LOW (ref 39.0–52.0)
Hemoglobin: 10.6 g/dL — ABNORMAL LOW (ref 13.0–17.0)
MCH: 29.7 pg (ref 26.0–34.0)
MCHC: 31.1 g/dL (ref 30.0–36.0)
MCV: 95.5 fL (ref 80.0–100.0)
Platelets: 426 10*3/uL — ABNORMAL HIGH (ref 150–400)
RBC: 3.57 MIL/uL — ABNORMAL LOW (ref 4.22–5.81)
RDW: 14.1 % (ref 11.5–15.5)
WBC: 18.4 10*3/uL — ABNORMAL HIGH (ref 4.0–10.5)
nRBC: 0.2 % (ref 0.0–0.2)

## 2020-11-10 LAB — GLUCOSE, CAPILLARY
Glucose-Capillary: 116 mg/dL — ABNORMAL HIGH (ref 70–99)
Glucose-Capillary: 126 mg/dL — ABNORMAL HIGH (ref 70–99)
Glucose-Capillary: 128 mg/dL — ABNORMAL HIGH (ref 70–99)
Glucose-Capillary: 128 mg/dL — ABNORMAL HIGH (ref 70–99)
Glucose-Capillary: 131 mg/dL — ABNORMAL HIGH (ref 70–99)
Glucose-Capillary: 134 mg/dL — ABNORMAL HIGH (ref 70–99)

## 2020-11-10 MED ORDER — FUROSEMIDE 10 MG/ML IJ SOLN
40.0000 mg | Freq: Once | INTRAMUSCULAR | Status: AC
  Administered 2020-11-10: 40 mg via INTRAVENOUS
  Filled 2020-11-10: qty 4

## 2020-11-10 NOTE — Progress Notes (Signed)
JP draining site leaking with max amount of fluid which saturated pt's gauzes and gown. Changing dressing x4.   Lavenia Atlas, RN

## 2020-11-10 NOTE — Progress Notes (Signed)
Mobility Specialist: Progress Note   11/10/20 1450  Mobility  Activity Ambulated in hall  Level of Assistance Independent  Assistive Device Four wheel walker  Distance Ambulated (ft) 470 ft  Mobility Response Tolerated well  Mobility performed by Mobility specialist  $Mobility charge 1 Mobility   During Mobility: 121 HR, 95% SpO2  Pt c/o 5/10 pain in his R abdomen during ambulation, otherwise asx. Pt to BR and then sitting EOB after walk.   [facility] Daylan Juhnke Mobility Specialist Mobility Specialist Phone: [phone]

## 2020-11-10 NOTE — Progress Notes (Signed)
Pt's drain is full and the dressing saturated. Dressing change with gauze and secured.

## 2020-11-10 NOTE — Progress Notes (Signed)
[facility] ROUNDING NOTE   Subjective: Repeat necrosectomy yesterday along with successful repositioning of previously placed biliary stent into good position.  Sludge also swept from the duct and an additional 8 [place] by 5 cm plastic biliary stent placed into the CBD with good bile flow.  340 cc out in JP bulb over the last 24 hours.  Liver enzymes normal and bilirubin now normal at 1.2.  ALP continues to improve at 429 today.  WBC 18.4.   Objective: Vital signs in last 24 hours: Temp:  [98 F (36.7 C)-99.5 F (37.5 C)] 99.5 F (37.5 C) (05/08 1123) Pulse Rate:  [83-109] 109 (05/08 1123) Resp:  [16-20] 16 (05/08 1123) BP: (107-124)/(66-75) 115/70 (05/08 1123) SpO2:  [93 %-97 %] 93 % (05/08 1123) Last BM Date: 11/05/20 General: NAD Abdomen:  Soft, NT, ND.  Gauze in place over right flank with JP bulb with bilious fluid Ext:  No c/c/e    Intake/Output from previous day: 05/07 0701 - 05/08 0700 In: 600 [I.V.:500; IV Piggyback:100] Out: 340 [Drains:340] Intake/Output this shift: Total I/O In: 360 [P.O.:360] Out: 600 [Urine:600]   Lab Results: Recent Labs    11/08/20 0146 11/09/20 0036 11/10/20 0039  WBC 12.0* 14.8* 18.4*  HGB 10.5* 10.6* 10.6*  PLT 344 413* 426*  MCV 92.2 94.6 95.5   BMET Recent Labs    11/08/20 0146 11/09/20 0036 11/10/20 0039  NA 135 135 135  K 4.3 4.2 4.3  CL 98 98 95*  CO2 30 29 32  GLUCOSE 118* 122* 181*  BUN 12 10 11   CREATININE 0.69 0.66 0.90  CALCIUM 8.0* 7.8* 7.8*   LFT Recent Labs    11/08/20 0146 11/09/20 0036 11/10/20 0039  PROT 5.1* 5.2* 5.4*  ALBUMIN 1.4* 1.5* 1.5*  AST 23 25 23   ALT 48* 41 33  ALKPHOS 692* 546* 429*  BILITOT 1.6* 1.4* 1.2   PT/INR No results for input(s): INR in the last 72 hours.    Imaging/Other results: DG Abd 1 View  Result Date: 11/10/2020 CLINICAL DATA:  Abdominal pain and distension status post ERCP with necrosectomy and placement of dual pigtail catheters across  cystgastrostomy stent EXAM: ABDOMEN - 1 VIEW COMPARISON:  CT abdomen/pelvis 11/05/2020 FINDINGS: Two images were used to encompass the majority of the abdomen. No obvious free air noted on these limited views. No evidence of bowel obstruction. Nonspecific bowel gas pattern with gas scattered throughout loops of small and large bowel. Limited evaluation of previously placed endoscopic prostheses. There appears to be both a biliary stent and 2 pigtail catheters traversing between the duodenum and region of the known pancreatic pseudocyst. IMPRESSION: Limited abdominal radiographs demonstrate no convincing evidence of free air or bowel obstruction. Electronically Signed   By: Jacqulynn Cadet M.D.   On: 11/10/2020 12:16   DG ERCP  Result Date: 11/09/2020 CLINICAL DATA:  50 year old male with postoperative bile leak following laparoscopic cholecystectomy. ERCP and biliary stent placement was performed but was complicated by post ERCP pancreatitis, pancreatic necrosis and large pseudocyst formation. Mass-effect from the pseudo cyst is thought to be causing further biliary obstruction. EXAM: ERCP TECHNIQUE: Multiple spot images obtained with the fluoroscopic device and submitted for interpretation post-procedure. FLUOROSCOPY TIME:  See operative report for further detail. COMPARISON:  CT abdomen 11/05/2020 FINDINGS: Seven saved fluoro images are submitted for review. The images demonstrate a flexible endoscope in the descending duodenum. A plastic biliary stent is present. Subsequent images demonstrate wire access of the common bile duct adjacent to  the previously placed stent. A second biliary stent is then placed. IMPRESSION: ERCP with placement of a second biliary stent. These images were submitted for radiologic interpretation only. Please see the procedural report for the amount of contrast and the fluoroscopy time utilized. Electronically Signed   By: Jacqulynn Cadet M.D.   On: 11/09/2020 15:12       Assessment and Plan:  50 year old male s/p cholecystectomy on 10/09/2020 c/b postop bile leak due to torn cystic duct that was clipped.  JP drain placed then ERCP with sphincterotomy and CBD stent placement on 10/10/2020 c/b post ERCP pancreatitis and proximal stent migration.  Repeat imaging with complex fluid collection in pancreatic head, uncinate, proximal body c/w necrotic cyst causing mass-effect on stomach/duodenum.  Was transferred to [facility] and 11/07/2020 underwent ERCP along with EUS and axials stent placement into walled off necrotic pseudocyst with partial necrosectomy and double-pigtail stent placement.  Repeat ERCP on 11/09/2020 with successful repositioning of previously placed CBD stent along with second stent placed into the CBD and necrosectomy.  1) Necrotic Pancreatic pseudocyst 2) Biliary obstruction 3) Leukocytosis 4) Hyperbilirubinemia-resolved 5) Cholecystectomy 10/09/20 c/b postop bile leak with JP drain in place 6) GERD with LA grade B erosive esophagitis  - Tentative plan for repeat necrosectomy with Dr. Rush Landmark tomorrow - N.p.o. midnight - Continue antimicrobial therapy - Continue PPI - Hold heparin for potential procedure tomorrow    Lavena Bullion, DO  11/10/2020, 2:49 PM [facility] Pager [phone]

## 2020-11-10 NOTE — H&P (View-Only) (Signed)
[facility] ROUNDING NOTE   Subjective: Repeat necrosectomy yesterday along with successful repositioning of previously placed biliary stent into good position.  Sludge also swept from the duct and an additional 8 [place] by 5 cm plastic biliary stent placed into the CBD with good bile flow.  340 cc out in JP bulb over the last 24 hours.  Liver enzymes normal and bilirubin now normal at 1.2.  ALP continues to improve at 429 today.  WBC 18.4.   Objective: Vital signs in last 24 hours: Temp:  [98 F (36.7 C)-99.5 F (37.5 C)] 99.5 F (37.5 C) (05/08 1123) Pulse Rate:  [83-109] 109 (05/08 1123) Resp:  [16-20] 16 (05/08 1123) BP: (107-124)/(66-75) 115/70 (05/08 1123) SpO2:  [93 %-97 %] 93 % (05/08 1123) Last BM Date: 11/05/20 General: NAD Abdomen:  Soft, NT, ND.  Gauze in place over right flank with JP bulb with bilious fluid Ext:  No c/c/e    Intake/Output from previous day: 05/07 0701 - 05/08 0700 In: 600 [I.V.:500; IV Piggyback:100] Out: 340 [Drains:340] Intake/Output this shift: Total I/O In: 360 [P.O.:360] Out: 600 [Urine:600]   Lab Results: Recent Labs    11/08/20 0146 11/09/20 0036 11/10/20 0039  WBC 12.0* 14.8* 18.4*  HGB 10.5* 10.6* 10.6*  PLT 344 413* 426*  MCV 92.2 94.6 95.5   BMET Recent Labs    11/08/20 0146 11/09/20 0036 11/10/20 0039  NA 135 135 135  K 4.3 4.2 4.3  CL 98 98 95*  CO2 30 29 32  GLUCOSE 118* 122* 181*  BUN 12 10 11   CREATININE 0.69 0.66 0.90  CALCIUM 8.0* 7.8* 7.8*   LFT Recent Labs    11/08/20 0146 11/09/20 0036 11/10/20 0039  PROT 5.1* 5.2* 5.4*  ALBUMIN 1.4* 1.5* 1.5*  AST 23 25 23   ALT 48* 41 33  ALKPHOS 692* 546* 429*  BILITOT 1.6* 1.4* 1.2   PT/INR No results for input(s): INR in the last 72 hours.    Imaging/Other results: DG Abd 1 View  Result Date: 11/10/2020 CLINICAL DATA:  Abdominal pain and distension status post ERCP with necrosectomy and placement of dual pigtail catheters across  cystgastrostomy stent EXAM: ABDOMEN - 1 VIEW COMPARISON:  CT abdomen/pelvis 11/05/2020 FINDINGS: Two images were used to encompass the majority of the abdomen. No obvious free air noted on these limited views. No evidence of bowel obstruction. Nonspecific bowel gas pattern with gas scattered throughout loops of small and large bowel. Limited evaluation of previously placed endoscopic prostheses. There appears to be both a biliary stent and 2 pigtail catheters traversing between the duodenum and region of the known pancreatic pseudocyst. IMPRESSION: Limited abdominal radiographs demonstrate no convincing evidence of free air or bowel obstruction. Electronically Signed   By: Jacqulynn Cadet M.D.   On: 11/10/2020 12:16   DG ERCP  Result Date: 11/09/2020 CLINICAL DATA:  50 year old male with postoperative bile leak following laparoscopic cholecystectomy. ERCP and biliary stent placement was performed but was complicated by post ERCP pancreatitis, pancreatic necrosis and large pseudocyst formation. Mass-effect from the pseudo cyst is thought to be causing further biliary obstruction. EXAM: ERCP TECHNIQUE: Multiple spot images obtained with the fluoroscopic device and submitted for interpretation post-procedure. FLUOROSCOPY TIME:  See operative report for further detail. COMPARISON:  CT abdomen 11/05/2020 FINDINGS: Seven saved fluoro images are submitted for review. The images demonstrate a flexible endoscope in the descending duodenum. A plastic biliary stent is present. Subsequent images demonstrate wire access of the common bile duct adjacent to  the previously placed stent. A second biliary stent is then placed. IMPRESSION: ERCP with placement of a second biliary stent. These images were submitted for radiologic interpretation only. Please see the procedural report for the amount of contrast and the fluoroscopy time utilized. Electronically Signed   By: Jacqulynn Cadet M.D.   On: 11/09/2020 15:12       Assessment and Plan:  50 year old male s/p cholecystectomy on 10/09/2020 c/b postop bile leak due to torn cystic duct that was clipped.  JP drain placed then ERCP with sphincterotomy and CBD stent placement on 10/10/2020 c/b post ERCP pancreatitis and proximal stent migration.  Repeat imaging with complex fluid collection in pancreatic head, uncinate, proximal body c/w necrotic cyst causing mass-effect on stomach/duodenum.  Was transferred to [facility] and 11/07/2020 underwent ERCP along with EUS and axials stent placement into walled off necrotic pseudocyst with partial necrosectomy and double-pigtail stent placement.  Repeat ERCP on 11/09/2020 with successful repositioning of previously placed CBD stent along with second stent placed into the CBD and necrosectomy.  1) Necrotic Pancreatic pseudocyst 2) Biliary obstruction 3) Leukocytosis 4) Hyperbilirubinemia-resolved 5) Cholecystectomy 10/09/20 c/b postop bile leak with JP drain in place 6) GERD with LA grade B erosive esophagitis  - Tentative plan for repeat necrosectomy with Dr. Rush Landmark tomorrow - N.p.o. midnight - Continue antimicrobial therapy - Continue PPI - Hold heparin for potential procedure tomorrow    Lavena Bullion, DO  11/10/2020, 2:49 PM [facility] Pager [phone]

## 2020-11-10 NOTE — Progress Notes (Signed)
PROGRESS NOTE    Steve Romero   MLY:[id number]  DOB: Feb 11, 1971  PCP: Raliegh Ip, DO    DOA: 11/01/2020 LOS: 9   Brief Narrative   Steve Romero is a 50 y.o. male with medical history significant for COPD, bipolar disorder, schizoaffective disorder and depression presented to the ED with persistent right-sided abdominal pain, N/V, poor appetite and early satiety.  Also noted increased JP drain output.   Initially admitted at [facility] on 4/29, transferred to [facility] for GI intervention.  Recent History: 10/09/20 cholecystectomy complicated by postop bile leak due to torn cystic duct that was clipped.  JP drain placed. 10/10/2020 ERCP with sphincterotomy and placement of plastic stent.  Developed post ERCP pancreatitis. Plan was for repeat ERCP and likely stent removal in 6 weeks.   Discharged after 4/6 - 4/13 admission.  Readmitted 4/29 from surgeons's office.   Pt has developed large, complex fluid collection at the pancreatic head, uncinate, proximal body.  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  The necrotic collection is causing mass-effect on the gastric antrum/pylorus/proximal duodenum with reactive small bowel thickening.  Stent in stable position.  Intrapelvic bile duct dilation is mild to moderate likely due to mass-effect from the fluid collection.  There is fluid edema surrounding the pancreas extending into the retroperitoneum and mesentery.  Reactive thickening in the colon.     Assessment & Plan   Principal Problem:   Disorder of bile duct stent Active Problems:   Pancreatic necrosis   Elevated LFTs   Ascending cholangitis   Protein-calorie malnutrition, severe   Post-ERCP Pancreatitis with necrotic fluid collection Bile leak status post cholecystectomy 4/6 -  JP drain in place. Initially admitted at [facility]. General surgery there Dr. Henreitta Leber d/w Dr. Meridee Score who advised transfer to [facility] for  possible advanced ERCP. 5/5 ERCP / EUS - see Op Note findings.  Biliary stent had migrated due to pressure from enlarging cyst. Partial necrosectomy was performed and stent dilated to relieve obstruction.   5/6 LFT's improved, jaundice resolved 5/7 ERCP - see op note. Prior stent accessible now and new one placed. Partial necrosectomy --GI following, see their recs --Possible repeat ERCP for further necrosectomy Mon or Tue --Diet per GI --Continue empiric Merrem --Follow CBC --RD consult for supplement recs --Pain control, antiemetics PRN --Follow cultures from 5/5  Leukocytosis - wbc up over past couple of days. In setting of two procedures, suspect reactive.  Pt afebrile and has no other s/sx's of infection at this time.  Monitor CBC and clinically.  COPD - stable, without no exacerbation sx's.   --Duonebs PRN  Schizoaffective disorder, Depression - stable. --resume Risperdal, trazodone  Obesity: Body mass index is 38.19 kg/m.   Complicates overall care and prognosis.  Recommend lifestyle modifications including physical activity and diet for weight loss and overall long-term health.   DVT prophylaxis:    Diet:  Diet Orders (From admission, onward)    Start     Ordered   11/10/20 1155  DIET SOFT Room service appropriate? Yes; Fluid consistency: Thin  Diet effective now       Question Answer Comment  Room service appropriate? Yes   Fluid consistency: Thin      11/10/20 1154            Code Status: Full Code    Subjective 11/10/20    Pt reports abdominal pain around 4-5 out of 10.  Feeling fairly well.  No fever chills or  N/V.    JP drain earlier was leaking excessively soaking sheets and needing dressing changed frequently. Pt reports it seems to be okay now.   Disposition Plan & Communication   Status is: Inpatient  Inpatient status appropriate due to severity of illness and ongoing diagnostic evaluation not appropriate for outpatient setting. Additional  procedure/s planned.  Dispo: The patient is from: home              Anticipated d/c is to: home              Patient currently not medically stable for d/c   Difficult to place patient no   Consults, Procedures, Significant Events   Consultants:   Gastroenterology, Dr. Rush Landmark  Procedures:   5/5 - ERCP  5/7 - ERCP    Antimicrobials:  Anti-infectives (From admission, onward)   Start     Dose/Rate Route Frequency Ordered Stop   11/01/20 2200  piperacillin-tazobactam (ZOSYN) IVPB 3.375 g  Status:  Discontinued        3.375 g 12.5 mL/hr over 240 Minutes Intravenous Every 8 hours 11/01/20 1335 11/01/20 1406   11/01/20 1500  meropenem (MERREM) 1 g in sodium chloride 0.9 % 100 mL IVPB        1 g 200 mL/hr over 30 Minutes Intravenous Every 8 hours 11/01/20 1413     11/01/20 1430  piperacillin-tazobactam (ZOSYN) IVPB 3.375 g  Status:  Discontinued        3.375 g 100 mL/hr over 30 Minutes Intravenous  Once 11/01/20 1353 11/01/20 1406        Micro    Objective   Vitals:   11/09/20 2318 11/10/20 0412 11/10/20 0803 11/10/20 1123  BP: 123/75 115/72 124/71 115/70  Pulse: 90 83 (!) 107 (!) 109  Resp: 20 20 19 16   Temp: 98.4 F (36.9 C) 98.2 F (36.8 C) 98.8 F (37.1 C) 99.5 F (37.5 C)  TempSrc: Oral Oral Oral Oral  SpO2: 97% 97% 96% 93%  Weight:      Height:        Intake/Output Summary (Last 24 hours) at 11/10/2020 1445 Last data filed at 11/10/2020 1441 Gross per 24 hour  Intake 460 ml  Output 920 ml  Net -460 ml   Filed Weights   11/06/20 0109 11/08/20 0340 11/09/20 0353  Weight: 120.3 kg 118.9 kg 119 kg    Physical Exam:  General exam: awake, alert, no acute distress, obese Respiratory system: normal respiratory effort, on room air Cardiovascular system: RRR, b/l LE 2-3+ pitting edema.   Gastrointestinal system: distended abdomen, JP drain in place without visible leakage at this time Central nervous system: A&O x3. Normal speech, CN's grossly  intact   Labs   Data Reviewed: I have personally reviewed following labs and imaging studies  CBC: Recent Labs  Lab 11/04/20 0633 11/04/20 2035 11/05/20 0609 11/06/20 0130 11/07/20 0048 11/08/20 0146 11/09/20 0036 11/10/20 0039  WBC 10.5 11.5* 13.5* 12.8* 12.2* 12.0* 14.8* 18.4*  NEUTROABS 8.0* 9.7* 11.6* 10.9*  --   --   --   --   HGB 12.1* 12.2* 12.4* 11.8* 11.4* 10.5* 10.6* 10.6*  HCT 39.3 38.4* 38.7* 35.8* 35.2* 32.1* 33.4* 34.1*  MCV 96.3 94.3 93.9 90.9 92.1 92.2 94.6 95.5  PLT 342 335 335 328 327 344 413* [id number]*   Basic Metabolic Panel: Recent Labs  Lab 11/06/20 0130 11/07/20 0048 11/08/20 0146 11/09/20 0036 11/10/20 0039  NA 134* 134* 135 135 135  K 3.6 4.3 4.3  4.2 4.3  CL 99 98 98 98 95*  CO2 27 28 30 29  32  GLUCOSE 113* 115* 118* 122* 181*  BUN 12 13 12 10 11   CREATININE 0.84 0.74 0.69 0.66 0.90  CALCIUM 8.1* 8.2* 8.0* 7.8* 7.8*   GFR: Estimated Creatinine Clearance: 127.4 mL/min (by C-G formula based on SCr of 0.9 mg/dL). Liver Function Tests: Recent Labs  Lab 11/06/20 0130 11/07/20 0048 11/08/20 0146 11/09/20 0036 11/10/20 0039  AST 60* 36 23 25 23   ALT 111* 80* 48* 41 33  ALKPHOS 1,138* 1,007* 692* 546* 429*  BILITOT 8.1* 4.7* 1.6* 1.4* 1.2  PROT 5.6* 5.6* 5.1* 5.2* 5.4*  ALBUMIN 1.8* 1.6* 1.4* 1.5* 1.5*   Recent Labs  Lab 11/04/20 0633 11/05/20 0609  LIPASE 74* 73*   No results for input(s): AMMONIA in the last 168 hours. Coagulation Profile: No results for input(s): INR, PROTIME in the last 168 hours. Cardiac Enzymes: No results for input(s): CKTOTAL, CKMB, CKMBINDEX, TROPONINI in the last 168 hours. BNP (last 3 results) No results for input(s): PROBNP in the last 8760 hours. HbA1C: No results for input(s): HGBA1C in the last 72 hours. CBG: Recent Labs  Lab 11/09/20 2044 11/09/20 2319 11/10/20 0415 11/10/20 0806 11/10/20 1115  GLUCAP 159* 176* 128* 116* 131*   Lipid Profile: No results for input(s): CHOL, HDL, LDLCALC,  TRIG, CHOLHDL, LDLDIRECT in the last 72 hours. Thyroid Function Tests: No results for input(s): TSH, T4TOTAL, FREET4, T3FREE, THYROIDAB in the last 72 hours. Anemia Panel: No results for input(s): VITAMINB12, FOLATE, FERRITIN, TIBC, IRON, RETICCTPCT in the last 72 hours. Sepsis Labs: Recent Labs  Lab 11/05/20 2319  LATICACIDVEN 1.2    Recent Results (from the past 240 hour(s))  SARS CORONAVIRUS 2 (TAT 6-24 HRS) Nasopharyngeal Nasopharyngeal Swab     Status: None   Collection Time: 11/01/20  5:27 PM   Specimen: Nasopharyngeal Swab  Result Value Ref Range Status   SARS Coronavirus 2 NEGATIVE NEGATIVE Final    Comment: (NOTE) SARS-CoV-2 target nucleic acids are NOT DETECTED.  The SARS-CoV-2 RNA is generally detectable in upper and lower respiratory specimens during the acute phase of infection. Negative results do not preclude SARS-CoV-2 infection, do not rule out co-infections with other pathogens, and should not be used as the sole basis for treatment or other patient management decisions. Negative results must be combined with clinical observations, patient history, and epidemiological information. The expected result is Negative.  Fact Sheet for Patients: SugarRoll.be  Fact Sheet for Healthcare Providers: https://www.woods-mathews.com/  This test is not yet approved or cleared by the [place] FDA and  has been authorized for detection and/or diagnosis of SARS-CoV-2 by FDA under an Emergency Use Authorization (EUA). This EUA will remain  in effect (meaning this test can be used) for the duration of the COVID-19 declaration under Se ction 564(b)(1) of the Act, 21 U.S.C. section 360bbb-3(b)(1), unless the authorization is terminated or revoked sooner.  Performed at [facility], [place] [address], [place], [place] [address]   Aerobic/Anaerobic Culture w Gram Stain (surgical/deep wound)     Status: None (Preliminary result)    Collection Time: 11/07/20  3:03 PM   Specimen: PATH Cytology FNA; Body Fluid  Result Value Ref Range Status   Specimen Description FLUID PANCREATIC CYST DRAINAGE  Final   Special Requests A  Final   Gram Stain   Final    NO WBC SEEN RARE YEAST Performed at [facility], [address],  [place], [place] [address]    Culture   Final    CULTURE REINCUBATED FOR BETTER GROWTH NO ANAEROBES ISOLATED; CULTURE IN PROGRESS FOR 5 DAYS    Report Status PENDING  Incomplete      Imaging Studies   DG Abd 1 View  Result Date: 11/10/2020 CLINICAL DATA:  Abdominal pain and distension status post ERCP with necrosectomy and placement of dual pigtail catheters across cystgastrostomy stent EXAM: ABDOMEN - 1 VIEW COMPARISON:  CT abdomen/pelvis 11/05/2020 FINDINGS: Two images were used to encompass the majority of the abdomen. No obvious free air noted on these limited views. No evidence of bowel obstruction. Nonspecific bowel gas pattern with gas scattered throughout loops of small and large bowel. Limited evaluation of previously placed endoscopic prostheses. There appears to be both a biliary stent and 2 pigtail catheters traversing between the duodenum and region of the known pancreatic pseudocyst. IMPRESSION: Limited abdominal radiographs demonstrate no convincing evidence of free air or bowel obstruction. Electronically Signed   By: Jacqulynn Cadet M.D.   On: 11/10/2020 12:16   DG ERCP  Result Date: 11/09/2020 CLINICAL DATA:  50 year old male with postoperative bile leak following laparoscopic cholecystectomy. ERCP and biliary stent placement was performed but was complicated by post ERCP pancreatitis, pancreatic necrosis and large pseudocyst formation. Mass-effect from the pseudo cyst is thought to be causing further biliary obstruction. EXAM: ERCP TECHNIQUE: Multiple spot images obtained with the fluoroscopic device and submitted for interpretation post-procedure. FLUOROSCOPY TIME:  See operative  report for further detail. COMPARISON:  CT abdomen 11/05/2020 FINDINGS: Seven saved fluoro images are submitted for review. The images demonstrate a flexible endoscope in the descending duodenum. A plastic biliary stent is present. Subsequent images demonstrate wire access of the common bile duct adjacent to the previously placed stent. A second biliary stent is then placed. IMPRESSION: ERCP with placement of a second biliary stent. These images were submitted for radiologic interpretation only. Please see the procedural report for the amount of contrast and the fluoroscopy time utilized. Electronically Signed   By: Jacqulynn Cadet M.D.   On: 11/09/2020 15:12     Medications   Scheduled Meds: . feeding supplement  237 mL Oral TID BM  . indomethacin  100 mg Rectal Once  . multivitamin with minerals  1 tablet Oral Daily  . polyethylene glycol  17 g Oral Daily  . risperiDONE  1 mg Oral QHS  . senna-docusate  1 tablet Oral BID  . traZODone  150 mg Oral QHS   Continuous Infusions: . meropenem (MERREM) IV 1 g (11/10/20 1350)       LOS: 9 days    Time spent: 25 minutes with > 50% spent in coordination of care and at bedside.     Ezekiel Slocumb, DO [facility]  11/10/2020, 2:45 PM      If 7PM-7AM, please contact night-coverage. How to contact the [facility] Attending or Consulting provider [address] or covering provider during after hours [place], for this patient?    1. Check the care team in [facility] and look for a) attending/consulting [facility] provider listed and b) the [facility] team listed 2. Log into [email or web address] and use [place]'s universal password to access. If you do not have the password, please contact the hospital operator. 3. Locate the [facility] provider you are looking for under [facility] and page to a number that you can be directly reached. 4. If you still have difficulty reaching the provider, please page the [facility] (Director on  Call) for the Hospitalists listed on amion for  assistance.

## 2020-11-10 NOTE — Progress Notes (Signed)
Leaking at jp drain located at the RUQ, moderate amount of fluid at the site. Dressing change x2.   Lavenia Atlas, RN

## 2020-11-10 NOTE — Progress Notes (Signed)
Consent form obtained and placed in pt's chart.  ° °Floyd Wade S Odessia Asleson, RN ° °

## 2020-11-11 ENCOUNTER — Encounter (HOSPITAL_COMMUNITY)
Admission: RE | Disposition: A | Discharge status: Home / Self Care | Payer: Self-pay | Source: Ambulatory Visit | Attending: Internal Medicine

## 2020-11-11 ENCOUNTER — Encounter (HOSPITAL_COMMUNITY): Payer: Self-pay | Admitting: Gastroenterology

## 2020-11-11 ENCOUNTER — Inpatient Hospital Stay (HOSPITAL_COMMUNITY): Payer: Medicaid Other | Admitting: Certified Registered"

## 2020-11-11 DIAGNOSIS — K209 Esophagitis, unspecified without bleeding: Secondary | ICD-10-CM | POA: Diagnosis not present

## 2020-11-11 DIAGNOSIS — T859XXD Unspecified complication of internal prosthetic device, implant and graft, subsequent encounter: Secondary | ICD-10-CM | POA: Diagnosis not present

## 2020-11-11 DIAGNOSIS — R7989 Other specified abnormal findings of blood chemistry: Secondary | ICD-10-CM | POA: Diagnosis not present

## 2020-11-11 DIAGNOSIS — K8689 Other specified diseases of pancreas: Secondary | ICD-10-CM | POA: Diagnosis not present

## 2020-11-11 DIAGNOSIS — K862 Cyst of pancreas: Secondary | ICD-10-CM | POA: Diagnosis not present

## 2020-11-11 DIAGNOSIS — K8309 Other cholangitis: Secondary | ICD-10-CM | POA: Diagnosis not present

## 2020-11-11 HISTORY — PX: STENT REMOVAL: SHX6421

## 2020-11-11 HISTORY — PX: BILIARY STENT PLACEMENT: SHX5538

## 2020-11-11 HISTORY — PX: CYST GASTROSTOMY: SHX6862

## 2020-11-11 HISTORY — PX: ESOPHAGOGASTRODUODENOSCOPY: SHX5428

## 2020-11-11 LAB — GLUCOSE, CAPILLARY
Glucose-Capillary: 129 mg/dL — ABNORMAL HIGH (ref 70–99)
Glucose-Capillary: 115 mg/dL — ABNORMAL HIGH (ref 70–99)
Glucose-Capillary: 110 mg/dL — ABNORMAL HIGH (ref 70–99)
Glucose-Capillary: 76 mg/dL (ref 70–99)
Glucose-Capillary: 227 mg/dL — ABNORMAL HIGH (ref 70–99)
Glucose-Capillary: 130 mg/dL — ABNORMAL HIGH (ref 70–99)

## 2020-11-11 LAB — CBC
HCT: 33.1 % — ABNORMAL LOW (ref 39.0–52.0)
Hemoglobin: 10.3 g/dL — ABNORMAL LOW (ref 13.0–17.0)
MCH: 29.9 pg (ref 26.0–34.0)
MCHC: 31.1 g/dL (ref 30.0–36.0)
MCV: 96.2 fL (ref 80.0–100.0)
Platelets: 442 10*3/uL — ABNORMAL HIGH (ref 150–400)
RBC: 3.44 MIL/uL — ABNORMAL LOW (ref 4.22–5.81)
RDW: 14 % (ref 11.5–15.5)
WBC: 16.1 10*3/uL — ABNORMAL HIGH (ref 4.0–10.5)
nRBC: 0.3 % — ABNORMAL HIGH (ref 0.0–0.2)

## 2020-11-11 LAB — COMPREHENSIVE METABOLIC PANEL
ALT: 29 U/L (ref 0–44)
AST: 27 U/L (ref 15–41)
Albumin: 1.4 g/dL — ABNORMAL LOW (ref 3.5–5.0)
Alkaline Phosphatase: 381 U/L — ABNORMAL HIGH (ref 38–126)
Anion gap: 7 (ref 5–15)
BUN: 10 mg/dL (ref 6–20)
CO2: 33 mmol/L — ABNORMAL HIGH (ref 22–32)
Calcium: 7.9 mg/dL — ABNORMAL LOW (ref 8.9–10.3)
Chloride: 95 mmol/L — ABNORMAL LOW (ref 98–111)
Creatinine, Ser: 0.83 mg/dL (ref 0.61–1.24)
GFR, Estimated: >60 mL/min (ref >60)
Glucose, Bld: 152 mg/dL — ABNORMAL HIGH (ref 70–99)
Potassium: 4 mmol/L (ref 3.5–5.1)
Sodium: 135 mmol/L (ref 135–145)
Total Bilirubin: 1 mg/dL (ref 0.3–1.2)
Total Protein: 5.6 g/dL — ABNORMAL LOW (ref 6.5–8.1)

## 2020-11-11 LAB — SURGICAL PATHOLOGY

## 2020-11-11 SURGERY — EGD (ESOPHAGOGASTRODUODENOSCOPY)
Anesthesia: General

## 2020-11-11 MED ORDER — DEXAMETHASONE SODIUM PHOSPHATE 10 MG/ML IJ SOLN
INTRAMUSCULAR | Status: DC | PRN
  Administered 2020-11-11: 10 mg via INTRAVENOUS

## 2020-11-11 MED ORDER — PROPOFOL 10 MG/ML IV BOLUS
INTRAVENOUS | Status: DC | PRN
  Administered 2020-11-11: 150 mg via INTRAVENOUS

## 2020-11-11 MED ORDER — LIDOCAINE HCL (CARDIAC) PF 100 MG/5ML IV SOSY
PREFILLED_SYRINGE | INTRAVENOUS | Status: DC | PRN
  Administered 2020-11-11: 20 mg via INTRAVENOUS

## 2020-11-11 MED ORDER — FENTANYL CITRATE (PF) 100 MCG/2ML IJ SOLN
INTRAMUSCULAR | Status: DC | PRN
  Administered 2020-11-11: 50 ug via INTRAVENOUS

## 2020-11-11 MED ORDER — MIDAZOLAM HCL 5 MG/5ML IJ SOLN
INTRAMUSCULAR | Status: DC | PRN
  Administered 2020-11-11: 2 mg via INTRAVENOUS

## 2020-11-11 MED ORDER — FLUCONAZOLE 100 MG PO TABS
400.0000 mg | ORAL_TABLET | Freq: Every day | ORAL | Status: DC
  Administered 2020-11-12 – 2020-11-24 (×13): 400 mg via ORAL
  Filled 2020-11-11 (×14): qty 4

## 2020-11-11 MED ORDER — PHENYLEPHRINE HCL (PRESSORS) 10 MG/ML IV SOLN
INTRAVENOUS | Status: DC | PRN
  Administered 2020-11-11 (×3): 80 ug via INTRAVENOUS

## 2020-11-11 MED ORDER — LIDOCAINE 2% (20 MG/ML) 5 ML SYRINGE
INTRAMUSCULAR | Status: DC | PRN
  Administered 2020-11-11: 80 mg via INTRAVENOUS

## 2020-11-11 MED ORDER — SODIUM CHLORIDE 0.9 % IV SOLN: INTRAVENOUS | Status: DC

## 2020-11-11 MED ORDER — ROCURONIUM BROMIDE 10 MG/ML (PF) SYRINGE
PREFILLED_SYRINGE | INTRAVENOUS | Status: DC | PRN
  Administered 2020-11-11: 60 mg via INTRAVENOUS

## 2020-11-11 MED ORDER — LACTATED RINGERS IV SOLN
INTRAVENOUS | Status: DC
  Administered 2020-11-11: 1000 mL via INTRAVENOUS

## 2020-11-11 MED ORDER — FLUCONAZOLE 100 MG PO TABS: 200.0000 mg | ORAL_TABLET | Freq: Every day | ORAL | Status: DC

## 2020-11-11 MED ORDER — FUROSEMIDE 10 MG/ML IJ SOLN
40.0000 mg | Freq: Every day | INTRAMUSCULAR | Status: DC
  Administered 2020-11-11 – 2020-11-12 (×2): 40 mg via INTRAVENOUS
  Filled 2020-11-11 (×2): qty 4

## 2020-11-11 MED ORDER — SUGAMMADEX SODIUM 200 MG/2ML IV SOLN
INTRAVENOUS | Status: DC | PRN
  Administered 2020-11-11: 200 mg via INTRAVENOUS

## 2020-11-11 NOTE — Anesthesia Preprocedure Evaluation (Signed)
Anesthesia Evaluation  Patient identified by MRN, date of birth, ID band Patient awake    Reviewed: Allergy & Precautions, NPO status , Patient's Chart, lab work & pertinent test results  Airway Mallampati: II  TM Distance: >3 FB Neck ROM: Full    Dental  (+) Dental Advisory Given, Edentulous Lower, Edentulous Upper   Pulmonary sleep apnea , COPD, Current Smoker and Patient abstained from smoking.,    Pulmonary exam normal breath sounds clear to auscultation       Cardiovascular hypertension,  Rhythm:Regular Rate:Tachycardia     Neuro/Psych PSYCHIATRIC DISORDERS Anxiety Depression Bipolar Disorder Schizophrenia negative neurological ROS     GI/Hepatic negative GI ROS, Pancreatic necrosis   Endo/Other  Obesity   Renal/GU negative Renal ROS     Musculoskeletal negative musculoskeletal ROS (+)   Abdominal   Peds  Hematology negative hematology ROS (+)   Anesthesia Other Findings Day of surgery medications reviewed with the patient.  Reproductive/Obstetrics                            Anesthesia Physical Anesthesia Plan  ASA: III  Anesthesia Plan: General   Post-op Pain Management:    Induction: Intravenous  PONV Risk Score and Plan: 2 and Midazolam, Dexamethasone and Ondansetron  Airway Management Planned: Oral ETT  Additional Equipment:   Intra-op Plan:   Post-operative Plan: Extubation in OR  Informed Consent: I have reviewed the patients History and Physical, chart, labs and discussed the procedure including the risks, benefits and alternatives for the proposed anesthesia with the patient or authorized representative who has indicated his/her understanding and acceptance.     Dental advisory given  Plan Discussed with: CRNA  Anesthesia Plan Comments:         Anesthesia Quick Evaluation

## 2020-11-11 NOTE — Op Note (Signed)
[facility] Patient Name: Steve Romero Procedure Date : 11/11/2020 MRN: [id number] Attending MD: Justice Britain , MD Date of Birth: [birth year] CSN: [id number] Age: 50 Admit Type: Inpatient Procedure:                Upper GI endoscopy Indications:              Pancreatic necrosis Providers:                Justice Britain, MD, Kary Kos RN, RN, Elspeth Cho Tech., Technician, Lesia Sago,                            Technician Referring MD:             Lanell Matar. Sharin Grave T. Fuller Plan, MD, [facility] Medicines:                General Anesthesia, Meropenem as per dosing                            scheduled Complications:            No immediate complications. Estimated Blood Loss:     Estimated blood loss was minimal. Procedure:                Pre-Anesthesia Assessment:                           - Prior to the procedure, a History and Physical                            was performed, and patient medications and                            allergies were reviewed. The patient's tolerance of                            previous anesthesia was also reviewed. The risks                            and benefits of the procedure and the sedation                            options and risks were discussed with the patient.                            All questions were answered, and informed consent                            was obtained. Prior Anticoagulants: The patient has                            taken no previous anticoagulant  or antiplatelet                            agents. ASA Grade Assessment: III - A patient with                            severe systemic disease. After reviewing the risks                            and benefits, the patient was deemed in                            satisfactory condition to undergo the procedure.                           After obtaining informed consent,  the endoscope was                            passed under direct vision. Throughout the                            procedure, the patient's blood pressure, pulse, and                            oxygen saturations were monitored continuously. The                            [id number] ([id number] Olympus therapeutic                            gastroscope was introduced through the mouth, and                            advanced to the second part of duodenum. The upper                            GI endoscopy was accomplished without difficulty.                            The patient tolerated the procedure. Scope In: Scope Out: Findings:      No gross lesions were noted in the proximal esophagus and in the mid       esophagus.      LA Grade B (one or more mucosal breaks greater than 5 mm, not extending       between the tops of two mucosal folds) esophagitis with no bleeding was       found in the distal esophagus.      A previously placed AXIOS cystgastrostomy stent was found at the       incisura with two double pigtail stents in place. The two double pigtail       stents were removed from the cystgastrostomy with a snare. The AXIOS       cystgastrostomy tract was entered under direct vision. The cyst was       completely filled with fluid and black necrotic tissue that was pasty  and adherent to the cyst wall. Necrosectomy was performed with a       rat-toothed forceps and Endorotor Powered Endoscopic Debrider, requiring       numerous intubations of the cyst for a total of 1 hour and 15 minutes.       The endorotor stopped functioning as a result of the significant       angulation that is required to enter into the proximal portion of the       cyst cavity. Deep seating to enter based on current location of AXIOS is       not felt to be possible with the adult or therapeutic endoscopes. At the       conclusion of the procedure, a medium amount of necrotic tissue and a       medium  amount of pink, viable tissue was found within the distal aspect       of the walled off necrosis/pseudocyst on direct vision. Two double       pigtail stents (7 French x 5 cm & 7 French x 7 cm) were inserted across       the cystgastrostomy tract.      A single 15 mm linear erosion with no bleeding and no stigmata of recent       bleeding was found in the gastric body a result of the double pigtail       hitting opposite wall of the stomach lining.      Patchy mildly erythematous mucosa without bleeding was found in the       entire examined stomach.      A moderate extrinsic deformity was found in the duodenal bulb, in the       first portion of the duodenum and in the second portion of the duodenum       - stable to last evaluation.      Two previously placed plastic biliary stents were seen at the major       papilla. Impression:               - No gross lesions in esophagus proximally. LA                            Grade B esophagitis with no bleeding distally.                           - AXIOS LAMS with 2 double pigtail stents found at                            incisura. Double pigtail stents removed.                            Necrosectomy was performed. 2 double pigtail stents                            replaced.                           - Linear erosion in stomach (opposite wall of a                            double pigtail) noted. Erythematous mucosa  in the                            stomach also noted. Previously biopsied.                           - Duodenal extrinsic deformity.                           - Plastic biliary stents at ampulla. Recommendation:           - The patient will be observed post-procedure,                            until all discharge criteria are met.                           - Return patient to hospital ward for ongoing care.                           - Advance diet as tolerated.                           - Observe patient's clinical course.                            - Continue antibiotics currently.                           - Monitor for signs/symptoms of bleeding,                            perforation, and infection. If issues please call                            our number to get further assistance as needed.                           - Recommend monitoring patient for next 2-days. If                            he is doing clinically well, then would recommend a                            CT-AP with IV/PO contrast be obtained in effort of                            seeing what degree of walled off necrosis remains.                            Will need to consider Interventional Radiology for                            possible percutaneous drainage catheter placement  while keeping AXIOS cystgastrostomy in place in                            effort of further flushing and drainage approach.                            If degree of walled off necrosis is lessened and                            patient doing well, would continue antibiotics                            (eventual transition to PO antibiotics) and monitor                            with hope of allowing body to do the rest of the                            work. Time will tell. If he has significant                            compromise hemodynamically or clinically, obtain                            earlier CT-Abdomen/Pelvis.                           - The findings and recommendations were discussed                            with the patient.                           - The findings and recommendations were discussed                            with the referring physician. Procedure Code(s):        --- Professional ---                           [phone], Esophagogastroduodenoscopy, flexible,                            transoral; diagnostic, including collection of                            specimen(s) by brushing or washing, when  performed                            (separate procedure)                           [id number], Unlisted procedure, pancreas Diagnosis Code(s):        --- Professional ---  K20.90, Esophagitis, unspecified without bleeding                           Z97.8, Presence of other specified devices                           K31.89, Other diseases of stomach and duodenum                           K86.89, Other specified diseases of pancreas CPT copyright 2019 [employer]. All rights reserved. The codes documented in this report are preliminary and upon coder review may  be revised to meet current compliance requirements. Justice Britain, MD 11/11/2020 4:19:55 PM Number of Addenda: 0

## 2020-11-11 NOTE — Interval H&P Note (Signed)
History and Physical Interval Note:  11/11/2020 1:53 PM  Steve Romero  has presented today for surgery, with the diagnosis of Pancreatic necrosis.  The various methods of treatment have been discussed with the patient and family. After consideration of risks, benefits and other options for treatment, the patient has consented to  Procedure(s): ESOPHAGOGASTRODUODENOSCOPY (EGD) with necrosectomy (N/A) as a surgical intervention.  The patient's history has been reviewed, patient examined, no change in status, stable for surgery.  I have reviewed the patient's chart and labs.  Questions were answered to the patient's satisfaction.     [employer]

## 2020-11-11 NOTE — Progress Notes (Signed)
Report given to endo RN. Procedure scheduled for 1345, transport expected to pick up around 1300.

## 2020-11-11 NOTE — Anesthesia Procedure Notes (Signed)
Procedure Name: Intubation Date/Time: 11/11/2020 2:21 PM Performed by: Lavell Luster, CRNA Pre-anesthesia Checklist: Patient identified, Emergency Drugs available, Suction available, Patient being monitored and Timeout performed Patient Re-evaluated:Patient Re-evaluated prior to induction Oxygen Delivery Method: Circle system utilized Preoxygenation: Pre-oxygenation with 100% oxygen Induction Type: IV induction Ventilation: Mask ventilation without difficulty Laryngoscope Size: Mac and 4 Grade View: Grade I Tube type: Oral Tube size: 7.5 mm Number of attempts: 1 Airway Equipment and Method: Stylet Placement Confirmation: ETT inserted through vocal cords under direct vision,  positive ETCO2 and breath sounds checked- equal and bilateral Secured at: 22 cm Tube secured with: Tape Dental Injury: Teeth and Oropharynx as per pre-operative assessment

## 2020-11-11 NOTE — Progress Notes (Addendum)
PROGRESS NOTE    Steve Romero   [id number]  DOB: 08/10/1970  PCP: [name], DO    DOA: 11/01/2020 LOS: 10   Brief Narrative   Steve Romero is a 50 y.o. male with medical history significant for COPD, bipolar disorder, schizoaffective disorder and depression presented to the ED with persistent right-sided abdominal pain, N/V, poor appetite and early satiety.  Also noted increased JP drain output.   Initially admitted at [facility] on 4/29, transferred to [facility] for GI intervention.  Recent History: 10/09/20 cholecystectomy complicated by postop bile leak due to torn cystic duct that was clipped.  JP drain placed. 10/10/2020 ERCP with sphincterotomy and placement of plastic stent.  Developed post ERCP pancreatitis. Plan was for repeat ERCP and likely stent removal in 6 weeks.   Discharged after 4/6 - 4/13 admission.  Readmitted 4/29 from surgeons's office.   Pt has developed large, complex fluid collection at the pancreatic head, uncinate, proximal body.  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  The necrotic collection is causing mass-effect on the gastric antrum/pylorus/proximal duodenum with reactive small bowel thickening.  Stent in stable position.  Intrapelvic bile duct dilation is mild to moderate likely due to mass-effect from the fluid collection.  There is fluid edema surrounding the pancreas extending into the retroperitoneum and mesentery.  Reactive thickening in the colon.     Assessment & Plan   Principal Problem:   Disorder of bile duct stent Active Problems:   Pancreatic necrosis   Elevated LFTs   Ascending cholangitis   Protein-calorie malnutrition, severe   Bile leak   Post-ERCP Pancreatitis with necrotic fluid collection Bile leak status post cholecystectomy 4/6 -  JP drain in place. Initially admitted at [facility]. General surgery there Dr. Constance Haw d/w Dr. Rush Landmark who advised transfer to [facility] for possible advanced ERCP. 5/5 ERCP / EUS - see Op Note findings.  Biliary stent had migrated due to pressure from enlarging cyst. Partial necrosectomy was performed and stent dilated to relieve obstruction.   5/6 LFT's improved, jaundice resolved 5/7 ERCP - see op note. Prior stent accessible now and new one placed. Partial necrosectomy --GI following, see their recs --For repeat ERCP, further necrosectomy today (5/9) --Diet per GI --Continue empiric Merrem --Follow CBC --RD consult for supplement recs --Pain control, antiemetics PRN --Follow cultures from 5/5  Leukocytosis - wbc up over past couple of days. In setting of two procedures, suspect reactive.  Pt afebrile and has no other s/sx's of infection at this time.  Monitor CBC and clinically.  Bilateral lower extremity edema - progressive over past several days.  Good response to trial of IV Lasix 5/8. --IV Lasix x 3 days and reassess, consider BID dosing if BP and renal function tolerate --Echo last month poor study, EF >75%, no mention of diastolic findings --Likely needs oral diuretic at d/c at least PRN swelling.  COPD - stable, without no exacerbation sx's.   --Duonebs PRN  Schizoaffective disorder, Depression - stable. --resume Risperdal, trazodone  Obesity: Body mass index is 38.19 kg/m.   Complicates overall care and prognosis.  Recommend lifestyle modifications including physical activity and diet for weight loss and overall long-term health.   DVT prophylaxis:    Diet:  Diet Orders (From admission, onward)    Start     Ordered   11/11/20 0001  Diet NPO time specified  Diet effective midnight        11/10/20 1506  Code Status: Full Code    Subjective 11/11/20    Pt seen while walking hall with RN this AM.  He goes for another procedure around 1 today.  Says he feels well, some abdominal discomfort but not too bad.  No other acute complaints.   Disposition Plan & Communication   Status  is: Inpatient  Inpatient status appropriate due to severity of illness and ongoing diagnostic evaluation not appropriate for outpatient setting. Additional procedure/s planned.  Dispo: The patient is from: home              Anticipated d/c is to: home              Patient currently not medically stable for d/c   Difficult to place patient no   Consults, Procedures, Significant Events   Consultants:   Gastroenterology, Dr. Rush Landmark  Procedures:   5/5 - ERCP  5/7 - ERCP    Antimicrobials:  Anti-infectives (From admission, onward)   Start     Dose/Rate Route Frequency Ordered Stop   11/01/20 2200  piperacillin-tazobactam (ZOSYN) IVPB 3.375 g  Status:  Discontinued        3.375 g 12.5 mL/hr over 240 Minutes Intravenous Every 8 hours 11/01/20 1335 11/01/20 1406   11/01/20 1500  meropenem (MERREM) 1 g in sodium chloride 0.9 % 100 mL IVPB        1 g 200 mL/hr over 30 Minutes Intravenous Every 8 hours 11/01/20 1413     11/01/20 1430  piperacillin-tazobactam (ZOSYN) IVPB 3.375 g  Status:  Discontinued        3.375 g 100 mL/hr over 30 Minutes Intravenous  Once 11/01/20 1353 11/01/20 1406        Micro    Objective   Vitals:   11/10/20 1123 11/10/20 1628 11/11/20 0329 11/11/20 0746  BP: 115/70 127/70 110/64 126/71  Pulse: (!) 109 (!) 106 99   Resp: 16 18 16 18   Temp: 99.5 F (37.5 C) 98.7 F (37.1 C) 99.1 F (37.3 C) 98.6 F (37 C)  TempSrc: Oral Oral Oral Oral  SpO2: 93% 95% 97% 94%  Weight:      Height:        Intake/Output Summary (Last 24 hours) at 11/11/2020 1144 Last data filed at 11/11/2020 1100 Gross per 24 hour  Intake 300 ml  Output 920 ml  Net -620 ml   Filed Weights   11/06/20 0109 11/08/20 0340 11/09/20 0353  Weight: 120.3 kg 118.9 kg 119 kg    Physical Exam:  General exam: walking hallway with RN, awake, alert, no acute distress, obese Respiratory system: normal respiratory effort, on room air Cardiovascular system: b/l LE with 3+ pitting  edema.   Central nervous system: A&O x3. Normal speech, CN's grossly intact   Labs   Data Reviewed: I have personally reviewed following labs and imaging studies  CBC: Recent Labs  Lab 11/04/20 2035 11/05/20 0609 11/06/20 0130 11/07/20 0048 11/08/20 0146 11/09/20 0036 11/10/20 0039 11/11/20 0313  WBC 11.5* 13.5* 12.8* 12.2* 12.0* 14.8* 18.4* 16.1*  NEUTROABS 9.7* 11.6* 10.9*  --   --   --   --   --   HGB 12.2* 12.4* 11.8* 11.4* 10.5* 10.6* 10.6* 10.3*  HCT 38.4* 38.7* 35.8* 35.2* 32.1* 33.4* 34.1* 33.1*  MCV 94.3 93.9 90.9 92.1 92.2 94.6 95.5 96.2  PLT 335 335 328 327 344 413* 426* 762*   Basic Metabolic Panel: Recent Labs  Lab 11/07/20 0048 11/08/20 0146 11/09/20 0036 11/10/20 0039  11/11/20 0313  NA 134* 135 135 135 135  K 4.3 4.3 4.2 4.3 4.0  CL 98 98 98 95* 95*  CO2 28 30 29  32 33*  GLUCOSE 115* 118* 122* 181* 152*  BUN 13 12 10 11 10   CREATININE 0.74 0.69 0.66 0.90 0.83  CALCIUM 8.2* 8.0* 7.8* 7.8* 7.9*   GFR: Estimated Creatinine Clearance: 138.1 mL/min (by C-G formula based on SCr of 0.83 mg/dL). Liver Function Tests: Recent Labs  Lab 11/07/20 0048 11/08/20 0146 11/09/20 0036 11/10/20 0039 11/11/20 0313  AST 36 23 25 23 27   ALT 80* 48* 41 33 29  ALKPHOS 1,007* 692* 546* 429* 381*  BILITOT 4.7* 1.6* 1.4* 1.2 1.0  PROT 5.6* 5.1* 5.2* 5.4* 5.6*  ALBUMIN 1.6* 1.4* 1.5* 1.5* 1.4*   Recent Labs  Lab 11/05/20 0609  LIPASE 73*   No results for input(s): AMMONIA in the last 168 hours. Coagulation Profile: No results for input(s): INR, PROTIME in the last 168 hours. Cardiac Enzymes: No results for input(s): CKTOTAL, CKMB, CKMBINDEX, TROPONINI in the last 168 hours. BNP (last 3 results) No results for input(s): PROBNP in the last 8760 hours. HbA1C: No results for input(s): HGBA1C in the last 72 hours. CBG: Recent Labs  Lab 11/10/20 1631 11/10/20 1940 11/10/20 2355 11/11/20 0417 11/11/20 0744  GLUCAP 126* 134* 128* 129* 115*   Lipid  Profile: No results for input(s): CHOL, HDL, LDLCALC, TRIG, CHOLHDL, LDLDIRECT in the last 72 hours. Thyroid Function Tests: No results for input(s): TSH, T4TOTAL, FREET4, T3FREE, THYROIDAB in the last 72 hours. Anemia Panel: No results for input(s): VITAMINB12, FOLATE, FERRITIN, TIBC, IRON, RETICCTPCT in the last 72 hours. Sepsis Labs: Recent Labs  Lab 11/05/20 2319  LATICACIDVEN 1.2    Recent Results (from the past 240 hour(s))  SARS CORONAVIRUS 2 (TAT 6-24 HRS) Nasopharyngeal Nasopharyngeal Swab     Status: None   Collection Time: 11/01/20  5:27 PM   Specimen: Nasopharyngeal Swab  Result Value Ref Range Status   SARS Coronavirus 2 NEGATIVE NEGATIVE Final    Comment: (NOTE) SARS-CoV-2 target nucleic acids are NOT DETECTED.  The SARS-CoV-2 RNA is generally detectable in upper and lower respiratory specimens during the acute phase of infection. Negative results do not preclude SARS-CoV-2 infection, do not rule out co-infections with other pathogens, and should not be used as the sole basis for treatment or other patient management decisions. Negative results must be combined with clinical observations, patient history, and epidemiological information. The expected result is Negative.  Fact Sheet for Patients: SugarRoll.be  Fact Sheet for Healthcare Providers: https://www.woods-mathews.com/  This test is not yet approved or cleared by the [place] FDA and  has been authorized for detection and/or diagnosis of SARS-CoV-2 by FDA under an Emergency Use Authorization (EUA). This EUA will remain  in effect (meaning this test can be used) for the duration of the COVID-19 declaration under Se ction 564(b)(1) of the Act, 21 U.S.C. section 360bbb-3(b)(1), unless the authorization is terminated or revoked sooner.  Performed at [facility], [place] [address], [place], [place] [address]   Aerobic/Anaerobic Culture w Gram Stain  (surgical/deep wound)     Status: None (Preliminary result)   Collection Time: 11/07/20  3:03 PM   Specimen: PATH Cytology FNA; Body Fluid  Result Value Ref Range Status   Specimen Description FLUID PANCREATIC CYST DRAINAGE  Final   Special Requests A  Final   Gram Stain   Final    NO WBC SEEN RARE  YEAST Performed at [facility], [place] [address], [place], [place] [address]    Culture   Final    MODERATE CANDIDA ALBICANS NO ANAEROBES ISOLATED; CULTURE IN PROGRESS FOR 5 DAYS    Report Status PENDING  Incomplete      Imaging Studies   DG Abd 1 View  Result Date: 11/10/2020 CLINICAL DATA:  Abdominal pain and distension status post ERCP with necrosectomy and placement of dual pigtail catheters across cystgastrostomy stent EXAM: ABDOMEN - 1 VIEW COMPARISON:  CT abdomen/pelvis 11/05/2020 FINDINGS: Two images were used to encompass the majority of the abdomen. No obvious free air noted on these limited views. No evidence of bowel obstruction. Nonspecific bowel gas pattern with gas scattered throughout loops of small and large bowel. Limited evaluation of previously placed endoscopic prostheses. There appears to be both a biliary stent and 2 pigtail catheters traversing between the duodenum and region of the known pancreatic pseudocyst. IMPRESSION: Limited abdominal radiographs demonstrate no convincing evidence of free air or bowel obstruction. Electronically Signed   By: Jacqulynn Cadet M.D.   On: 11/10/2020 12:16   DG ERCP  Result Date: 11/09/2020 CLINICAL DATA:  50 year old male with postoperative bile leak following laparoscopic cholecystectomy. ERCP and biliary stent placement was performed but was complicated by post ERCP pancreatitis, pancreatic necrosis and large pseudocyst formation. Mass-effect from the pseudo cyst is thought to be causing further biliary obstruction. EXAM: ERCP TECHNIQUE: Multiple spot images obtained with the fluoroscopic device and submitted for interpretation  post-procedure. FLUOROSCOPY TIME:  See operative report for further detail. COMPARISON:  CT abdomen 11/05/2020 FINDINGS: Seven saved fluoro images are submitted for review. The images demonstrate a flexible endoscope in the descending duodenum. A plastic biliary stent is present. Subsequent images demonstrate wire access of the common bile duct adjacent to the previously placed stent. A second biliary stent is then placed. IMPRESSION: ERCP with placement of a second biliary stent. These images were submitted for radiologic interpretation only. Please see the procedural report for the amount of contrast and the fluoroscopy time utilized. Electronically Signed   By: Jacqulynn Cadet M.D.   On: 11/09/2020 15:12     Medications   Scheduled Meds: . feeding supplement  237 mL Oral TID BM  . indomethacin  100 mg Rectal Once  . multivitamin with minerals  1 tablet Oral Daily  . polyethylene glycol  17 g Oral Daily  . risperiDONE  1 mg Oral QHS  . senna-docusate  1 tablet Oral BID  . traZODone  150 mg Oral QHS   Continuous Infusions: . meropenem (MERREM) IV 1 g (11/11/20 0518)       LOS: 10 days    Time spent: 20 minutes    Ezekiel Slocumb, DO [facility]  11/11/2020, 11:44 AM      If 7PM-7AM, please contact night-coverage. How to contact the [facility] Attending or Consulting provider [place] or covering provider during after hours [place], for this patient?    1. Check the care team in [facility] and look for a) attending/consulting [facility] provider listed and b) the [facility] team listed 2. Log into [email or web address] and use [place]'s universal password to access. If you do not have the password, please contact the hospital operator. 3. Locate the [facility] provider you are looking for under [facility] and page to a number that you can be directly reached. 4. If you still have difficulty reaching the provider, please page the [facility] (Director on Call) for the  Hospitalists listed on amion for  assistance.

## 2020-11-11 NOTE — Progress Notes (Signed)
Mobility Specialist - Progress Note   11/11/20 1817  Mobility  Activity Ambulated in hall  Level of Assistance Independent  Assistive Device Four wheel walker  Distance Ambulated (ft) 400 ft  Mobility Response Tolerated well  Mobility performed by Mobility specialist  $Mobility charge 1 Mobility   Pre-mobility: 103 HR During mobility: 121 HR Post-mobility: 111 HR  Pt asx throughout ambulation. Pt to bathroom after walk.   Pricilla Handler Mobility Specialist Mobility Specialist Phone: [phone]

## 2020-11-11 NOTE — Transfer of Care (Signed)
Immediate Anesthesia Transfer of Care Note  Patient: Steve Romero  Procedure(s) Performed: ESOPHAGOGASTRODUODENOSCOPY (EGD) with necrosectomy (N/A ) [place]  Patient Location: Endoscopy Unit  Anesthesia Type:General  Level of Consciousness: awake, alert  and sedated  Airway & Oxygen Therapy: Patient connected to face mask oxygen  Post-op Assessment: Post -op Vital signs reviewed and stable  Post vital signs: stable  Last Vitals:  Vitals Value Taken Time  BP 155/81 11/11/20 1555  Temp 36.5 C 11/11/20 1555  Pulse 90 11/11/20 1600  Resp 16 11/11/20 1600  SpO2 98 % 11/11/20 1600  Vitals shown include unvalidated device data.  Last Pain:  Vitals:   11/11/20 1555  TempSrc: Temporal  PainSc:       Patients Stated Pain Goal: 0 (41/28/78 6767)  Complications: No complications documented.

## 2020-11-12 ENCOUNTER — Inpatient Hospital Stay (HOSPITAL_COMMUNITY): Payer: Medicaid Other

## 2020-11-12 DIAGNOSIS — T859XXD Unspecified complication of internal prosthetic device, implant and graft, subsequent encounter: Secondary | ICD-10-CM | POA: Diagnosis not present

## 2020-11-12 DIAGNOSIS — R609 Edema, unspecified: Secondary | ICD-10-CM

## 2020-11-12 DIAGNOSIS — K8689 Other specified diseases of pancreas: Secondary | ICD-10-CM | POA: Diagnosis not present

## 2020-11-12 DIAGNOSIS — K839 Disease of biliary tract, unspecified: Secondary | ICD-10-CM | POA: Diagnosis not present

## 2020-11-12 DIAGNOSIS — K8309 Other cholangitis: Secondary | ICD-10-CM | POA: Diagnosis not present

## 2020-11-12 LAB — COMPREHENSIVE METABOLIC PANEL
ALT: 26 U/L (ref 0–44)
AST: 26 U/L (ref 15–41)
Albumin: 1.4 g/dL — ABNORMAL LOW (ref 3.5–5.0)
Alkaline Phosphatase: 295 U/L — ABNORMAL HIGH (ref 38–126)
Anion gap: 7 (ref 5–15)
BUN: 10 mg/dL (ref 6–20)
CO2: 36 mmol/L — ABNORMAL HIGH (ref 22–32)
Calcium: 7.9 mg/dL — ABNORMAL LOW (ref 8.9–10.3)
Chloride: 92 mmol/L — ABNORMAL LOW (ref 98–111)
Creatinine, Ser: 0.89 mg/dL (ref 0.61–1.24)
GFR, Estimated: >60 mL/min (ref >60)
Glucose, Bld: 114 mg/dL — ABNORMAL HIGH (ref 70–99)
Potassium: 4.4 mmol/L (ref 3.5–5.1)
Sodium: 135 mmol/L (ref 135–145)
Total Bilirubin: 1.1 mg/dL (ref 0.3–1.2)
Total Protein: 5.4 g/dL — ABNORMAL LOW (ref 6.5–8.1)

## 2020-11-12 LAB — CBC
HCT: 30.3 % — ABNORMAL LOW (ref 39.0–52.0)
Hemoglobin: 9.6 g/dL — ABNORMAL LOW (ref 13.0–17.0)
MCH: 30.2 pg (ref 26.0–34.0)
MCHC: 31.7 g/dL (ref 30.0–36.0)
MCV: 95.3 fL (ref 80.0–100.0)
Platelets: 421 10*3/uL — ABNORMAL HIGH (ref 150–400)
RBC: 3.18 MIL/uL — ABNORMAL LOW (ref 4.22–5.81)
RDW: 13.8 % (ref 11.5–15.5)
WBC: 15.3 10*3/uL — ABNORMAL HIGH (ref 4.0–10.5)
nRBC: 0.3 % — ABNORMAL HIGH (ref 0.0–0.2)

## 2020-11-12 LAB — D-DIMER, QUANTITATIVE: D-Dimer, Quant: 12.4 ug/mL-FEU — ABNORMAL HIGH (ref 0.00–0.50)

## 2020-11-12 LAB — AEROBIC/ANAEROBIC CULTURE W GRAM STAIN (SURGICAL/DEEP WOUND): Gram Stain: NONE SEEN

## 2020-11-12 LAB — GLUCOSE, CAPILLARY
Glucose-Capillary: 124 mg/dL — ABNORMAL HIGH (ref 70–99)
Glucose-Capillary: 144 mg/dL — ABNORMAL HIGH (ref 70–99)
Glucose-Capillary: 112 mg/dL — ABNORMAL HIGH (ref 70–99)

## 2020-11-12 MED ORDER — IOHEXOL 350 MG/ML SOLN
50.0000 mL | Freq: Once | INTRAVENOUS | Status: AC | PRN
  Administered 2020-11-12: 50 mL via INTRAVENOUS

## 2020-11-12 MED ORDER — HEPARIN SODIUM (PORCINE) 5000 UNIT/ML IJ SOLN
5000.0000 [IU] | Freq: Three times a day (TID) | INTRAMUSCULAR | Status: AC
  Administered 2020-11-12 – 2020-11-13 (×5): 5000 [IU] via SUBCUTANEOUS
  Filled 2020-11-12 (×5): qty 1

## 2020-11-12 MED ORDER — FUROSEMIDE 10 MG/ML IJ SOLN
40.0000 mg | Freq: Two times a day (BID) | INTRAMUSCULAR | Status: DC
  Administered 2020-11-12 – 2020-11-18 (×12): 40 mg via INTRAVENOUS
  Filled 2020-11-12 (×12): qty 4

## 2020-11-12 MED ORDER — BISACODYL 5 MG PO TBEC: 5.0000 mg | DELAYED_RELEASE_TABLET | Freq: Every day | ORAL | Status: DC | PRN

## 2020-11-12 MED ORDER — MAGNESIUM CITRATE PO SOLN
1.0000 | Freq: Once | ORAL | Status: AC
  Administered 2020-11-12: 1 via ORAL
  Filled 2020-11-12: qty 296

## 2020-11-12 NOTE — Progress Notes (Addendum)
Progress Note  Chief Complaint:    pancreatitis     ASSESSMENT / PLAN:    #50 yo male s/p cholecystectomy 01/07/07 complicated by postop bile leak. JP drain placed then ERCP with sphincterotomy and CBD stent placement on 07/09/4816 which was complicated by post ERCP pancreatitis and proximal stent migration.  Repeat imaging with complex fluid collection in pancreatic head, uncinate, proximal body c/w necrotic cyst causing mass-effect on stomach/duodenum.  Transferred to [facility] and 11/07/2020 underwent ERCP along with EUS and AXIOS stent placement into walled off necrotic pseudocyst with partial necrosectomy and double-pigtail stent placement. Repeat ERCP on 11/09/2020 with successful repositioning of previously placed CBD stent along with second stent placed into the CBD and necrosectomy. Yesterday he underwent EGD - LA Grade B esophagitis, 2 pigtail stents at incisura were removed. Necrosectomy performed. Duodenal extrinsic deformity again noted. Plastic biliary stents at ampulla.  --Generalized upper abdominal discomfort but no worse than days prior. Using less meds today.  --WBC 16 >> 15.1. Afebrile. On Meropenem --Alk phos 381 >> 295 --He is tolerating a soft diet --Needs follow up CT scan in a couple of days to see how much walled off necrosis remains.    # Constipation. Last BM ~ 5 days ago. Continue daily Miralax and Senokot. He just consumed a bottle of Mg+ Citrate. Advised to drink plenty of water due to Mg+ citrate's osmotic effect.    # Bilateral lower extremity edema, better with Lasix. Just back from [place]. Preliminary report negative for DVT. No history of CHF     Attending Physician Note   I have taken an interval history, reviewed the chart and examined the patient. I agree with the Advanced Practitioner's note, impression and recommendations.   S/P cholecystectomy c/b a bile leak with a biliary stent placed c/b post ERCP pancreatitis and proximal migration of the biliary  stent. The stent was pulled into correct position on 5/7 and a second biliary stent was placed. His pancreatitis was c/b a necrotic pseudocyst. A cystogastrostomy was created with an AXIOS stent at EUS on 5/5 with initial necrosectomy. Repeat necrosectomy was performed on 5/7 and 5/9. He is stable clinically. Schedule repeat CT AP for tomorrow. Possible EGD/necrosectomy on Thursday per Dr. Rush Landmark.   Constipation received a bottle of MgCitrate   BLE edema, per primary service   [name], MD [facility] [phone]       SUBJECTIVE:   Overall feels about the same. Still has abdominal pain but taking less pain meds. Tolerated soft diet. Hasn't had a BM in several days    OBJECTIVE:    Scheduled inpatient medications:  . feeding supplement  237 mL Oral TID BM  . fluconazole  400 mg Oral Daily  . furosemide  40 mg Intravenous BID  . indomethacin  100 mg Rectal Once  . magnesium citrate  1 Bottle Oral Once  . multivitamin with minerals  1 tablet Oral Daily  . polyethylene glycol  17 g Oral Daily  . risperiDONE  1 mg Oral QHS  . senna-docusate  1 tablet Oral BID  . traZODone  150 mg Oral QHS   Continuous inpatient infusions:  . meropenem (MERREM) IV 1 g (11/12/20 0550)   PRN inpatient medications: acetaminophen **OR** acetaminophen, bisacodyl, diphenhydrAMINE **OR** diphenhydrAMINE, ipratropium-albuterol, morphine injection, ondansetron **OR** ondansetron (ZOFRAN) IV, oxyCODONE  Vital signs in last 24 hours: Temp:  [97.7 F (36.5 C)-99.4 F (37.4 C)] 98.1 F (36.7 C) (05/10 1101) Pulse Rate:  [80-102]  86 (05/10 1101) Resp:  [15-22] 18 (05/10 1101) BP: (110-167)/(64-90) 110/64 (05/10 1101) SpO2:  [90 %-100 %] 100 % (05/10 1101) Last BM Date: 11/09/20  Intake/Output Summary (Last 24 hours) at 11/12/2020 1158 Last data filed at 11/12/2020 0379 Gross per 24 hour  Intake 1150 ml  Output 1065 ml  Net 85 ml     Physical Exam:  General: Alert male in NAD Heart:  Regular  rate Pulmonary: Normal respiratory effort Abdomen: Soft, obese. Hyperactive bowel sounds.  Neurologic: Alert and oriented Psych: Pleasant. Cooperative.   Filed Weights   11/06/20 0109 11/08/20 0340 11/09/20 0353  Weight: 120.3 kg 118.9 kg 119 kg    Intake/Output from previous day: 05/09 0701 - 05/10 0700 In: 1150 [P.O.:300; I.V.:800; IV Piggyback:50] Out: 1105 [Urine:800; Drains:305] Intake/Output this shift: No intake/output data recorded.    Lab Results: Recent Labs    11/10/20 0039 11/11/20 0313 11/12/20 0401  WBC 18.4* 16.1* 15.3*  HGB 10.6* 10.3* 9.6*  HCT 34.1* 33.1* 30.3*  PLT 426* 442* 421*   BMET Recent Labs    11/10/20 0039 11/11/20 0313 11/12/20 0401  NA 135 135 135  K 4.3 4.0 4.4  CL 95* 95* 92*  CO2 32 33* 36*  GLUCOSE 181* 152* 114*  BUN _0 CREATININE 0.90 0.83 0.89  CALCIUM 7.8* 7.9* 7.9*   LFT Recent Labs    11/12/20 0401  PROT 5.4*  ALBUMIN 1.4*  AST 26  ALT 26  ALKPHOS 295*  BILITOT 1.1   PT/INR No results for input(s): LABPROT, INR in the last 72 hours. Hepatitis Panel No results for input(s): HEPBSAG, HCVAB, HEPAIGM, HEPBIGM in the last 72 hours.  No results found.  Principal Problem:   Disorder of bile duct stent Active Problems:   Pancreatic necrosis   Elevated LFTs   Ascending cholangitis   Protein-calorie malnutrition, severe   Bile leak     LOS: 11 days   Tye Savoy ,NP 11/12/2020, 11:58 AM

## 2020-11-12 NOTE — Progress Notes (Signed)
Pharmacy Antibiotic Note  Steve Romero is a 50 y.o. male admitted at [facility] on 11/01/2020 with intra-abdominal infection/necrotizing pancreatitis s/p bile leak status post cholecystectomy 4/6. Pharmacy was consulted for empiric meropenem dosing starting on 11/01/20. Patient was transferred on 11/05/20 to [facility] for advanced ERCP intervention.   5/7:  s/p EGD with necrosectomy and ERCP. 5/9: s/p EGD with necrosectomy  with successful repositioning of previously placed biliary stent into good position and additional biliary stent place into CBD.    Afebrile, WBC trending down to15.3 today.  SCr stable <1 5/5 pancreatic cyst fluid cx: no WBC, rare yeast - > Candida albicans .  I discussed with ID [name] on 5/9, recommended start fluconazole,which Dr. Arbutus Ped agree to start, fluconazole 400 mg po once daily.    Plan: Continue Meropenem 1g IV every 8 hours Fluconazole 400mg  po Daily started 11/11/20. Monitor C&S, renal function, GI plans  Height: 5' 9.5" (176.5 cm) Weight: 119 kg (262 lb 5.4 oz) IBW/kg (Calculated) : 71.85  Temp (24hrs), Avg:98.1 F (36.7 C), Min:97.7 F (36.5 C), Max:99.4 F (37.4 C)  Recent Labs  Lab 11/05/20 2319 11/06/20 0130 11/08/20 0146 11/09/20 0036 11/10/20 0039 11/11/20 0313 11/12/20 0401  WBC  --    < > 12.0* 14.8* 18.4* 16.1* 15.3*  CREATININE  --    < > 0.69 0.66 0.90 0.83 0.89  LATICACIDVEN 1.2  --   --   --   --   --   --    < > = values in this interval not displayed.    Estimated Creatinine Clearance: 128.8 mL/min (by C-G formula based on SCr of 0.89 mg/dL).    Allergies  Allergen Reactions  . Desipramine Nausea Only and Rash    Antimicrobials this admission: Merrem 4/29 >>   Microbiology results:   5/5 pancreatic cyst fluid cx: no WBC, rare yeast - > Candida albicans -notes as pending but ID said lab does not run sens on this   Thank you for allowing pharmacy to be a part of this patient's care.   Nicole Cella,  RPh Clinical Parmacist [phone] Please check AMION for all [place] phone numbers After 10:00 PM, call [place] [phone] 11/12/2020 12:55 PM

## 2020-11-12 NOTE — Progress Notes (Signed)
BLE venous duplex has been completed.  Results can be found under chart review under CV PROC. 11/12/2020 12:27 PM Eligio Angert RVT, RDMS

## 2020-11-12 NOTE — Progress Notes (Signed)
Mobility Specialist - Progress Note   11/12/20 1710  Mobility  Activity Ambulated in hall  Level of [place] wheel walker  Distance Ambulated (ft) 420 ft  Mobility Ambulated independently in hallway  Mobility Response Tolerated well  Mobility performed by Mobility specialist  $Mobility charge 1 Mobility   Pt asx throughout ambulation. His HR ranged from 100-110 throughout. Pt to bathroom after walk.   Pricilla Handler Mobility Specialist Mobility Specialist Phone: [phone]

## 2020-11-12 NOTE — Progress Notes (Signed)
PROGRESS NOTE    Steve Romero   WUX:[id number]  DOB: 1970-09-16  PCP: Raliegh Ip, DO    DOA: 11/01/2020 LOS: 11   Brief Narrative   Steve Romero is a 50 y.o. male with medical history significant for COPD, bipolar disorder, schizoaffective disorder and depression presented to the ED with persistent right-sided abdominal pain, N/V, poor appetite and early satiety.  Also noted increased JP drain output.   Initially admitted at [facility] on 4/29, transferred to [facility] for GI intervention.  Recent History: 10/09/20 cholecystectomy complicated by postop bile leak due to torn cystic duct that was clipped.  JP drain placed. 10/10/2020 ERCP with sphincterotomy and placement of plastic stent.  Developed post ERCP pancreatitis. Plan was for repeat ERCP and likely stent removal in 6 weeks.   Discharged after 4/6 - 4/13 admission.  Readmitted 4/29 from surgeons's office.   Pt has developed large, complex fluid collection at the pancreatic head, uncinate, proximal body.  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  The necrotic collection is causing mass-effect on the gastric antrum/pylorus/proximal duodenum with reactive small bowel thickening.  Stent in stable position.  Intrapelvic bile duct dilation is mild to moderate likely due to mass-effect from the fluid collection.  There is fluid edema surrounding the pancreas extending into the retroperitoneum and mesentery.  Reactive thickening in the colon.   Significant Events:  5/5 ERCP / EUS - see Op Note findings.    5/6 LFT's improved, jaundice resolved 5/7 ERCP - see op note. Prior stent accessible now and new one placed. Partial necrosectomy 5/9 ERCP - necrosectomy and replacement of stent  Assessment & Plan   Principal Problem:   Disorder of bile duct stent Active Problems:   Pancreatic necrosis   Elevated LFTs   Ascending cholangitis   Protein-calorie malnutrition, severe   Bile  leak   Post-ERCP Pancreatitis with necrotic fluid collection Bile leak status post cholecystectomy 4/6 -  JP drain in place. Initially admitted at [facility]. General surgery there Dr. Henreitta Leber d/w Dr. Meridee Score who advised transfer to [facility] for possible advanced ERCP.  --GI following, see their recs --For repeat ERCP, further necrosectomy today (5/9) --Diet per GI --Continue empiric Merrem --Follow CBC --RD consult for supplement recs --Pain control, antiemetics PRN --Follow cultures from 5/5  Leukocytosis - wbc up over past couple of days. In setting of two procedures, suspect reactive.  Pt afebrile and has no other s/sx's of infection at this time.  Monitor CBC and clinically.  Bilateral lower extremity edema - progressive over past several days.  Good response to trial of IV Lasix 5/8. --Increase IV Lasix 40 mg to BID  --Monitor renal function and electrolytes --Duplex U/S LE's --Check D-dimer --Echo last month poor study, EF >75%, no mention of diastolic findings --Likely needs oral diuretic at d/c at least PRN swelling.  COPD - stable, without no exacerbation sx's.   --Duonebs PRN  Schizoaffective disorder, Depression - stable. --resume Risperdal, trazodone  Obesity: Body mass index is 38.19 kg/m.   Complicates overall care and prognosis.  Recommend lifestyle modifications including physical activity and diet for weight loss and overall long-term health.   DVT prophylaxis: resume heparin, hold for any procedures   Diet:  Diet Orders (From admission, onward)    Start     Ordered   11/12/20 1335  DIET SOFT Room service appropriate? Yes; Fluid consistency: Thin  Diet effective now       Question Answer Comment  Room service appropriate? Yes   Fluid consistency: Thin      11/12/20 1334            Code Status: Full Code    Subjective 11/12/20    Pt seen sitting up edge of bed today.  Reports feeling okay.  Some abdominal discomfort / pain, mild.  No BM  but is passing some gas.  No N/V, F/C or other complaints.  Says left leg is always a bit larger than right and that swelling does not tend to be better in AM after sleeping overnight.   Disposition Plan & Communication   Status is: Inpatient  Inpatient status appropriate due to severity of illness and ongoing diagnostic evaluation not appropriate for outpatient setting. Requires close monitoring and IV antibiotics.  May need further procedure/s.  Dispo: The patient is from: home              Anticipated d/c is to: home              Patient currently not medically stable for d/c   Difficult to place patient no   Consults, Procedures, Significant Events   Consultants:   Gastroenterology, Dr. Meridee Score  Procedures:   5/5 - ERCP  5/7 - ERCP    Antimicrobials:  Anti-infectives (From admission, onward)   Start     Dose/Rate Route Frequency Ordered Stop   11/11/20 1315  fluconazole (DIFLUCAN) tablet 200 mg  Status:  Discontinued        200 mg Oral Daily 11/11/20 1221 11/11/20 1226   11/11/20 1315  fluconazole (DIFLUCAN) tablet 400 mg        400 mg Oral Daily 11/11/20 1226     11/01/20 2200  piperacillin-tazobactam (ZOSYN) IVPB 3.375 g  Status:  Discontinued        3.375 g 12.5 mL/hr over 240 Minutes Intravenous Every 8 hours 11/01/20 1335 11/01/20 1406   11/01/20 1500  meropenem (MERREM) 1 g in sodium chloride 0.9 % 100 mL IVPB        1 g 200 mL/hr over 30 Minutes Intravenous Every 8 hours 11/01/20 1413     11/01/20 1430  piperacillin-tazobactam (ZOSYN) IVPB 3.375 g  Status:  Discontinued        3.375 g 100 mL/hr over 30 Minutes Intravenous  Once 11/01/20 1353 11/01/20 1406        Micro    Objective   Vitals:   11/11/20 2319 11/12/20 0324 11/12/20 0751 11/12/20 1101  BP: 110/72 121/74 125/68 110/64  Pulse: 80 89 100 86  Resp: 20 20 19 18   Temp: 97.8 F (36.6 C) 98 F (36.7 C) 99.4 F (37.4 C) 98.1 F (36.7 C)  TempSrc: Oral Oral Oral Oral  SpO2: 97% 97% 98%  100%  Weight:      Height:        Intake/Output Summary (Last 24 hours) at 11/12/2020 1410 Last data filed at 11/12/2020 1300 Gross per 24 hour  Intake 1100 ml  Output 1135 ml  Net -35 ml   Filed Weights   11/06/20 0109 11/08/20 0340 11/09/20 0353  Weight: 120.3 kg 118.9 kg 119 kg    Physical Exam:  General exam: sitting up edge of bed, awake, alert, no acute distress, obese Respiratory system: normal respiratory effort, on room air Cardiovascular system: RRR, BLE edema Central nervous system: A&O x3. Normal speech, CN's grossly intact Extremities: b/l LE 3+ pitting edema left >right, moves all, normal tone, b/l LE venous stasis  Labs   Data Reviewed: I have personally reviewed following labs and imaging studies  CBC: Recent Labs  Lab 11/06/20 0130 11/07/20 0048 11/08/20 0146 11/09/20 0036 11/10/20 0039 11/11/20 0313 11/12/20 0401  WBC 12.8*   < > 12.0* 14.8* 18.4* 16.1* 15.3*  NEUTROABS 10.9*  --   --   --   --   --   --   HGB 11.8*   < > 10.5* 10.6* 10.6* 10.3* 9.6*  HCT 35.8*   < > 32.1* 33.4* 34.1* 33.1* 30.3*  MCV 90.9   < > 92.2 94.6 95.5 96.2 95.3  PLT 328   < > 344 413* 426* 442* 421*   < > = values in this interval not displayed.   Basic Metabolic Panel: Recent Labs  Lab 11/08/20 0146 11/09/20 0036 11/10/20 0039 11/11/20 0313 11/12/20 0401  NA 135 135 135 135 135  K 4.3 4.2 4.3 4.0 4.4  CL 98 98 95* 95* 92*  CO2 30 29 32 33* 36*  GLUCOSE 118* 122* 181* 152* 114*  BUN 12 10 11 10 10   CREATININE 0.69 0.66 0.90 0.83 0.89  CALCIUM 8.0* 7.8* 7.8* 7.9* 7.9*   GFR: Estimated Creatinine Clearance: 128.8 mL/min (by C-G formula based on SCr of 0.89 mg/dL). Liver Function Tests: Recent Labs  Lab 11/08/20 0146 11/09/20 0036 11/10/20 0039 11/11/20 0313 11/12/20 0401  AST 23 25 23 27 26   ALT 48* 41 33 29 26  ALKPHOS 692* 546* 429* 381* 295*  BILITOT 1.6* 1.4* 1.2 1.0 1.1  PROT 5.1* 5.2* 5.4* 5.6* 5.4*  ALBUMIN 1.4* 1.5* 1.5* 1.4* 1.4*   No  results for input(s): LIPASE, AMYLASE in the last 168 hours. No results for input(s): AMMONIA in the last 168 hours. Coagulation Profile: No results for input(s): INR, PROTIME in the last 168 hours. Cardiac Enzymes: No results for input(s): CKTOTAL, CKMB, CKMBINDEX, TROPONINI in the last 168 hours. BNP (last 3 results) No results for input(s): PROBNP in the last 8760 hours. HbA1C: No results for input(s): HGBA1C in the last 72 hours. CBG: Recent Labs  Lab 11/11/20 1647 11/11/20 2015 11/11/20 2317 11/12/20 0326 11/12/20 0754  GLUCAP 76 227* 130* 124* 144*   Lipid Profile: No results for input(s): CHOL, HDL, LDLCALC, TRIG, CHOLHDL, LDLDIRECT in the last 72 hours. Thyroid Function Tests: No results for input(s): TSH, T4TOTAL, FREET4, T3FREE, THYROIDAB in the last 72 hours. Anemia Panel: No results for input(s): VITAMINB12, FOLATE, FERRITIN, TIBC, IRON, RETICCTPCT in the last 72 hours. Sepsis Labs: Recent Labs  Lab 11/05/20 2319  LATICACIDVEN 1.2    Recent Results (from the past 240 hour(s))  Aerobic/Anaerobic Culture w Gram Stain (surgical/deep wound)     Status: None (Preliminary result)   Collection Time: 11/07/20  3:03 PM   Specimen: PATH Cytology FNA; Body Fluid  Result Value Ref Range Status   Specimen Description FLUID PANCREATIC CYST DRAINAGE  Final   Special Requests A  Final   Gram Stain   Final    NO WBC SEEN RARE YEAST Performed at [facility], [address] [place], [place] [address]    Culture   Final    MODERATE CANDIDA ALBICANS NO ANAEROBES ISOLATED; CULTURE IN PROGRESS FOR 5 DAYS    Report Status PENDING  Incomplete      Imaging Studies   VAS [place] LOWER EXTREMITY VENOUS (DVT)  Result Date: 11/12/2020  Lower Venous DVT Study Patient Name:  AQIL WAHLE  Date of Exam:   11/12/2020 Medical  Rec #: [id number]       Accession #:    [id number] Date of Birth: Oct 29, 1970       Patient Gender: M Patient Age:   52Y Exam Location:  [facility]  Procedure:      VAS [place] LOWER EXTREMITY VENOUS (DVT) Referring Phys: [id number] Demara Lover A Clark Cuff --------------------------------------------------------------------------------  Indications: Edema.  Comparison Study: No previous exams Performing Technologist: Ernestene Mention  Examination Guidelines: A complete evaluation includes B-mode imaging, spectral Doppler, color Doppler, and power Doppler as needed of all accessible portions of each vessel. Bilateral testing is considered an integral part of a complete examination. Limited examinations for reoccurring indications may be performed as noted. The reflux portion of the exam is performed with the patient in reverse Trendelenburg.  +---------+---------------+---------+-----------+----------+--------------+ RIGHT    CompressibilityPhasicitySpontaneityPropertiesThrombus Aging +---------+---------------+---------+-----------+----------+--------------+ CFV      Full           Yes      Yes                                 +---------+---------------+---------+-----------+----------+--------------+ SFJ      Full                                                        +---------+---------------+---------+-----------+----------+--------------+ FV Prox  Full           Yes      Yes                                 +---------+---------------+---------+-----------+----------+--------------+ FV Mid   Full           Yes      Yes                                 +---------+---------------+---------+-----------+----------+--------------+ FV DistalFull           Yes      Yes                                 +---------+---------------+---------+-----------+----------+--------------+ PFV      Full                                                        +---------+---------------+---------+-----------+----------+--------------+ POP      Full           Yes      Yes                                  +---------+---------------+---------+-----------+----------+--------------+ PTV      Full                                                        +---------+---------------+---------+-----------+----------+--------------+  PERO     Full                                                        +---------+---------------+---------+-----------+----------+--------------+   +---------+---------------+---------+-----------+----------+--------------+ LEFT     CompressibilityPhasicitySpontaneityPropertiesThrombus Aging +---------+---------------+---------+-----------+----------+--------------+ CFV      Full           Yes      Yes                                 +---------+---------------+---------+-----------+----------+--------------+ SFJ      Full                                                        +---------+---------------+---------+-----------+----------+--------------+ FV Prox  Full           Yes      Yes                                 +---------+---------------+---------+-----------+----------+--------------+ FV Mid   Full           Yes      Yes                                 +---------+---------------+---------+-----------+----------+--------------+ FV DistalFull           Yes      Yes                                 +---------+---------------+---------+-----------+----------+--------------+ PFV      Full                                                        +---------+---------------+---------+-----------+----------+--------------+ POP      Full           Yes      Yes                                 +---------+---------------+---------+-----------+----------+--------------+ PTV      Full                                                        +---------+---------------+---------+-----------+----------+--------------+ PERO     Full                                                         +---------+---------------+---------+-----------+----------+--------------+  Summary: BILATERAL: - No evidence of deep vein thrombosis seen in the lower extremities, bilaterally. - No evidence of superficial venous thrombosis in the lower extremities, bilaterally. -No evidence of popliteal cyst, bilaterally.   *See table(s) above for measurements and observations.    Preliminary      Medications   Scheduled Meds: . feeding supplement  237 mL Oral TID BM  . fluconazole  400 mg Oral Daily  . furosemide  40 mg Intravenous BID  . heparin injection (subcutaneous)  5,000 Units Subcutaneous Q8H  . indomethacin  100 mg Rectal Once  . multivitamin with minerals  1 tablet Oral Daily  . polyethylene glycol  17 g Oral Daily  . risperiDONE  1 mg Oral QHS  . senna-docusate  1 tablet Oral BID  . traZODone  150 mg Oral QHS   Continuous Infusions: . meropenem (MERREM) IV 1 g (11/12/20 1224)       LOS: 11 days    Time spent: 30 minutes with >50% spent at bedside and in coordination of care.     Pennie Banter, DO [facility]  11/12/2020, 2:10 PM      If 7PM-7AM, please contact night-coverage. How to contact the [facility] Attending or Consulting provider 7A - 7P or covering provider during after hours 7P -7A, for this patient?    1. Check the care team in [facility] and look for a) attending/consulting [facility] provider listed and b) the [facility] team listed 2. Log into [email or web address] and use [place] universal password to access. If you do not have the password, please contact the hospital operator. 3. Locate the [facility] provider you are looking for under [facility] and page to a number that you can be directly reached. 4. If you still have difficulty reaching the provider, please page the [facility] (Director on Call) for the Hospitalists listed on amion for assistance.

## 2020-11-12 NOTE — Anesthesia Postprocedure Evaluation (Signed)
Anesthesia Post Note  Patient: Adonai Selsor  Procedure(s) Performed: ESOPHAGOGASTRODUODENOSCOPY (EGD) with necrosectomy (N/A ) Max     Patient location during evaluation: Endoscopy Anesthesia Type: General Level of consciousness: awake and alert Pain management: pain level controlled Vital Signs Assessment: post-procedure vital signs reviewed and stable Respiratory status: spontaneous breathing, nonlabored ventilation, respiratory function stable and patient connected to nasal cannula oxygen Cardiovascular status: blood pressure returned to baseline and stable Postop Assessment: no apparent nausea or vomiting Anesthetic complications: no   No complications documented.  Last Vitals:  Vitals:   11/12/20 0324 11/12/20 0751  BP: 121/74 125/68  Pulse: 89 100  Resp: 20 19  Temp: 36.7 C 37.4 C  SpO2: 97% 98%    Last Pain:  Vitals:   11/12/20 0751  TempSrc: Oral  PainSc: 0-No pain                 Catalina Gravel

## 2020-11-13 ENCOUNTER — Inpatient Hospital Stay (HOSPITAL_COMMUNITY): Payer: Medicaid Other

## 2020-11-13 ENCOUNTER — Encounter (HOSPITAL_COMMUNITY): Payer: Self-pay | Admitting: Gastroenterology

## 2020-11-13 ENCOUNTER — Encounter: Payer: Self-pay | Admitting: Gastroenterology

## 2020-11-13 DIAGNOSIS — E43 Unspecified severe protein-calorie malnutrition: Secondary | ICD-10-CM | POA: Diagnosis not present

## 2020-11-13 DIAGNOSIS — K8689 Other specified diseases of pancreas: Secondary | ICD-10-CM | POA: Diagnosis not present

## 2020-11-13 DIAGNOSIS — K863 Pseudocyst of pancreas: Secondary | ICD-10-CM | POA: Diagnosis not present

## 2020-11-13 DIAGNOSIS — K839 Disease of biliary tract, unspecified: Secondary | ICD-10-CM | POA: Diagnosis not present

## 2020-11-13 LAB — COMPREHENSIVE METABOLIC PANEL
ALT: 25 U/L (ref 0–44)
AST: 25 U/L (ref 15–41)
Albumin: 1.5 g/dL — ABNORMAL LOW (ref 3.5–5.0)
Alkaline Phosphatase: 297 U/L — ABNORMAL HIGH (ref 38–126)
Anion gap: 7 (ref 5–15)
BUN: 11 mg/dL (ref 6–20)
CO2: 33 mmol/L — ABNORMAL HIGH (ref 22–32)
Calcium: 7.9 mg/dL — ABNORMAL LOW (ref 8.9–10.3)
Chloride: 93 mmol/L — ABNORMAL LOW (ref 98–111)
Creatinine, Ser: 0.91 mg/dL (ref 0.61–1.24)
GFR, Estimated: >60 mL/min (ref >60)
Glucose, Bld: 162 mg/dL — ABNORMAL HIGH (ref 70–99)
Potassium: 4.2 mmol/L (ref 3.5–5.1)
Sodium: 133 mmol/L — ABNORMAL LOW (ref 135–145)
Total Bilirubin: 1 mg/dL (ref 0.3–1.2)
Total Protein: 6 g/dL — ABNORMAL LOW (ref 6.5–8.1)

## 2020-11-13 LAB — CBC
HCT: 33.1 % — ABNORMAL LOW (ref 39.0–52.0)
Hemoglobin: 10.5 g/dL — ABNORMAL LOW (ref 13.0–17.0)
MCH: 30.1 pg (ref 26.0–34.0)
MCHC: 31.7 g/dL (ref 30.0–36.0)
MCV: 94.8 fL (ref 80.0–100.0)
Platelets: 437 10*3/uL — ABNORMAL HIGH (ref 150–400)
RBC: 3.49 MIL/uL — ABNORMAL LOW (ref 4.22–5.81)
RDW: 13.7 % (ref 11.5–15.5)
WBC: 14.7 10*3/uL — ABNORMAL HIGH (ref 4.0–10.5)
nRBC: 0.2 % (ref 0.0–0.2)

## 2020-11-13 LAB — GLUCOSE, CAPILLARY
Glucose-Capillary: 141 mg/dL — ABNORMAL HIGH (ref 70–99)
Glucose-Capillary: 121 mg/dL — ABNORMAL HIGH (ref 70–99)
Glucose-Capillary: 121 mg/dL — ABNORMAL HIGH (ref 70–99)

## 2020-11-13 MED ORDER — ALBUMIN HUMAN 25 % IV SOLN
25.0000 g | Freq: Four times a day (QID) | INTRAVENOUS | Status: AC
  Administered 2020-11-13 – 2020-11-14 (×4): 25 g via INTRAVENOUS
  Filled 2020-11-13 (×4): qty 100

## 2020-11-13 MED ORDER — IOHEXOL 300 MG/ML  SOLN
75.0000 mL | Freq: Once | INTRAMUSCULAR | Status: AC | PRN
  Administered 2020-11-13: 75 mL via INTRAVENOUS

## 2020-11-13 NOTE — H&P (View-Only) (Signed)
Daily Rounding Note  11/13/2020, 11:08 AM  LOS: 12 days   SUBJECTIVE:   Chief complaint:   Pancreatic pseudocyst with necrosis.  Clinically patient doing well.  No significant abdominal pain.  Tolerating solid food, no nausea, vomiting.  Walking in the hallway.  Still having bilateral, painless lower extremity swelling which he says started after he was prescribed Lasix.  Good bowel movements x2 yesterday.  Used 40 mg oxycodone yesterday, 10 mg so far today.  OBJECTIVE:         Vital signs in last 24 hours:    Temp:  [97.9 F (36.6 C)-98.9 F (37.2 C)] 98.2 F (36.8 C) (05/11 0740) Pulse Rate:  [88-105] 99 (05/11 0740) Resp:  [17-20] 17 (05/11 0740) BP: (116-134)/(55-83) 119/65 (05/11 0740) SpO2:  [97 %-98 %] 98 % (05/11 0740) Last BM Date: 11/12/20 Filed Weights   11/06/20 0109 11/08/20 0340 11/09/20 0353  Weight: 120.3 kg 118.9 kg 119 kg   General: Comfortable, nontoxic.  Looks somewhat chronically ill. Heart: RRR Chest: Clear bilaterally without labored breathing or cough Abdomen: Soft without tenderness.  Active bowel sounds.  No distention, moderate obesity Extremities: Bilateral, left > right lower extremity edema with mild dusky appearance, overall looks like venous insufficiency. Neuro/Psych: Alert.  Appropriate.  Oriented x3.  Moves all 4 limbs.  No tremors.  Intake/Output from previous day: 05/10 0701 - 05/11 0700 In: -  Out: 185 [Drains:185]  Intake/Output this shift: No intake/output data recorded.  Lab Results: Recent Labs    11/11/20 0313 11/12/20 0401 11/13/20 0151  WBC 16.1* 15.3* 14.7*  HGB 10.3* 9.6* 10.5*  HCT 33.1* 30.3* 33.1*  PLT 442* 421* 437*   BMET Recent Labs    11/11/20 0313 11/12/20 0401 11/13/20 0151  NA 135 135 133*  K 4.0 4.4 4.2  CL 95* 92* 93*  CO2 33* 36* 33*  GLUCOSE 152* 114* 162*  BUN 10 10 11   CREATININE 0.83 0.89 0.91  CALCIUM 7.9* 7.9* 7.9*    LFT Recent Labs    11/11/20 0313 11/12/20 0401 11/13/20 0151  PROT 5.6* 5.4* 6.0*  ALBUMIN 1.4* 1.4* 1.5*  AST 27 26 25   ALT 29 26 25   ALKPHOS 381* 295* 297*  BILITOT 1.0 1.1 1.0    Studies/Results: CT ANGIO CHEST PE W OR WO CONTRAST  Result Date: 11/12/2020 CLINICAL DATA:  PE suspected, shortness of breath EXAM: CT ANGIOGRAPHY CHEST WITH CONTRAST TECHNIQUE: Multidetector CT imaging of the chest was performed using the standard protocol during bolus administration of intravenous contrast. Multiplanar CT image reconstructions and MIPs were obtained to evaluate the vascular anatomy. CONTRAST:  53mL OMNIPAQUE IOHEXOL 350 MG/ML SOLN COMPARISON:  None. FINDINGS: Cardiovascular: Examination for pulmonary embolism is somewhat limited by breath motion artifact, particularly in the lung bases. Within this limitation, no evidence of pulmonary embolism through the proximal segmental pulmonary arterial level. No evidence of pulmonary embolism. Normal heart size. No pericardial effusion. Mediastinum/Nodes: No enlarged mediastinal, hilar, or axillary lymph nodes. Thyroid gland, trachea, and esophagus demonstrate no significant findings. Lungs/Pleura: Bandlike scarring and or atelectasis of the bilateral lung bases. No pleural effusion or pneumothorax. Upper Abdomen: No acute abnormality. Musculoskeletal: No chest wall abnormality. No acute or significant osseous findings. Review of the MIP images confirms the above findings. IMPRESSION: 1. Examination for pulmonary embolism is somewhat limited by breath motion artifact, particularly in the lung bases. Within this limitation, no evidence of pulmonary embolism through the proximal segmental pulmonary  arterial level. 2. Bandlike scarring and/or atelectasis of the bilateral lung bases. Electronically Signed   By: Eddie Candle M.D.   On: 11/12/2020 16:30   CT ABDOMEN PELVIS W CONTRAST  Result Date: 11/13/2020 CLINICAL DATA:  Acute severe pancreatitis. EXAM: CT  ABDOMEN AND PELVIS WITH CONTRAST TECHNIQUE: Multidetector CT imaging of the abdomen and pelvis was performed using the standard protocol following bolus administration of intravenous contrast. CONTRAST:  19mL OMNIPAQUE IOHEXOL 300 MG/ML  SOLN COMPARISON:  CT angio chest 11/12/2020, CT abdomen 11/05/2020 FINDINGS: Lower chest: Bilateral lower lobe linear atelectasis versus scarring. Bronchial wall thickening. Hepatobiliary: No focal liver abnormality. Status post cholecystectomy. Mild pneumobilia noted centrally on the left. Slightly improved mild intrahepatic biliary ductal dilatation on the left (5:45). Common bile duct stent in grossly appropriate position. Redemonstration of a gallbladder fossa surgical drain. Pancreas: Similar marked diffuse haziness of the pancreatic parenchyma, parenchymal edema, and peripancreatic fat stranding/free fluid. Again noted is poor opacification of the pancreatic parenchyma. Status post gastropancreatic pseudocyst stent with interval decrease in size of an approximately 5 cm (from 11 x 7 cm) proximal intraparenchymal pancreatic lesion now containing fluid and gas. This pseudocyst is again noted to be irregular in contour. Redemonstration of a very ill-defined poorly organized fluid collection within the transverse mesocolon (3:43) that measures approximately 9 cm in diameter. No definite new organized fluid collection. Spleen: Normal in size without focal abnormality. Adrenals/Urinary Tract: No adrenal nodule bilaterally. Bilateral kidneys enhance symmetrically. No hydronephrosis. No hydroureter. The urinary bladder is unremarkable. Stomach/Bowel: Stomach is within normal limits. No evidence of bowel wall thickening or dilatation. Stool throughout the colon. The appendix not definitely identified. Vascular/Lymphatic: No significant vascular findings are present. Multiple prominent mesenteric lymph nodes that are likely reactive in etiology. No enlarged abdominal or pelvic lymph  nodes. Reproductive: Prostate is unremarkable. Other: No intraperitoneal free gas. Musculoskeletal: Interval increase in subcutaneus soft tissue edema. No suspicious lytic or blastic osseous lesions. No acute displaced fracture. Degenerative changes at the L3-L4 level. IMPRESSION: 1. Persistent severe subacute necrotizing pancreatitis status post gastropancreatic pseudocyst stent placement with interval decrease in size of an approximately 5 cm (from 11 x 7 cm) intraparenchymal pancreatic lesion. 2. Interval slightly improved left intrahepatic biliary ductal dilatation. 3. Persistently very ill-defined and poorly organized approximately 9 cm in caliber fluid collection within the transverse mesocolon. 4. Status post cholecystectomy with gallbladder fossa surgical drain in similar position. 5. Stool throughout the colon. Electronically Signed   By: Iven Finn M.D.   On: 11/13/2020 06:36     ASSESMENT:   *   Postop bile leak.  ERCP w stent placement.  Post ERCP pancreatitis complicated byt large, complex pscyst.   11/07/2020 ERCP/EUS, axial stent placement, partial necrosectomy, double-pigtail stent placement.  Biliary stent had migrated proximally due to the pseudocyst. 11/09/2020 ERCP w repositioning of an original biliary stent and placement of second stent to CBD, necrosectomy. 11/11/2020 EGD, grade B esophagitis, 2 pigtail stents at incisura were removed, necrosectomy performed.  Ongoing extrinsic deformity of duodenum.  Plastic stents noted at ampulla. CT scan this morning showing persistent, severe necrotizing pancreatitis.  Interval decrease in size of pseudocyst at 5 cm, down from 11 x 7 cm.  Slight improvement in left intrahepatic ductal dilatation.  Ill-defined, poorly organized 9 cm fluid collection in the transverse mesocolon.  Surgical drain in gallbladder fossa, status post cholecystectomy.  Stool throughout colon. though leukocytosis persists, improved from 18.4 >> 18.7.  *    Constipation.  *  LE edema.  No DVT on Dopplers of 5/10  *   [place] anemia.   PLAN   *   Dr Rush Landmark set for 3rd EGD / necrosectomy tmrw at 1130.     [name]  11/13/2020, 11:08 AM Phone [phone]     Attending Physician Note   I have taken an interval history, reviewed the chart and examined the patient. I agree with the Advanced Practitioner's note, impression and recommendations.   Bile leak with 2 CBD stents in place. Pancreatitis c/b large necrotic pseudocyst for repeat EGD / necrosectomy Thursday at 1130. Further recommendations per Dr. Rush Landmark.    [name], MD FACG [phone]

## 2020-11-13 NOTE — Progress Notes (Addendum)
        Daily Rounding Note  11/13/2020, 11:08 AM  LOS: 12 days   SUBJECTIVE:   Chief complaint:   Pancreatic pseudocyst with necrosis.  Clinically patient doing well.  No significant abdominal pain.  Tolerating solid food, no nausea, vomiting.  Walking in the hallway.  Still having bilateral, painless lower extremity swelling which he says started after he was prescribed Lasix.  Good bowel movements x2 yesterday.  Used 40 mg oxycodone yesterday, 10 mg so far today.  OBJECTIVE:         Vital signs in last 24 hours:    Temp:  [97.9 F (36.6 C)-98.9 F (37.2 C)] 98.2 F (36.8 C) (05/11 0740) Pulse Rate:  [88-105] 99 (05/11 0740) Resp:  [17-20] 17 (05/11 0740) BP: (116-134)/(55-83) 119/65 (05/11 0740) SpO2:  [97 %-98 %] 98 % (05/11 0740) Last BM Date: 11/12/20 Filed Weights   11/06/20 0109 11/08/20 0340 11/09/20 0353  Weight: 120.3 kg 118.9 kg 119 kg   General: Comfortable, nontoxic.  Looks somewhat chronically ill. Heart: RRR Chest: Clear bilaterally without labored breathing or cough Abdomen: Soft without tenderness.  Active bowel sounds.  No distention, moderate obesity Extremities: Bilateral, left > right lower extremity edema with mild dusky appearance, overall looks like venous insufficiency. Neuro/Psych: Alert.  Appropriate.  Oriented x3.  Moves all 4 limbs.  No tremors.  Intake/Output from previous day: 05/10 0701 - 05/11 0700 In: -  Out: 185 [Drains:185]  Intake/Output this shift: No intake/output data recorded.  Lab Results: Recent Labs    11/11/20 0313 11/12/20 0401 11/13/20 0151  WBC 16.1* 15.3* 14.7*  HGB 10.3* 9.6* 10.5*  HCT 33.1* 30.3* 33.1*  PLT 442* 421* 437*   BMET Recent Labs    11/11/20 0313 11/12/20 0401 11/13/20 0151  NA 135 135 133*  K 4.0 4.4 4.2  CL 95* 92* 93*  CO2 33* 36* 33*  GLUCOSE 152* 114* 162*  BUN 10 10 11  CREATININE 0.83 0.89 0.91  CALCIUM 7.9* 7.9* 7.9*    LFT Recent Labs    11/11/20 0313 11/12/20 0401 11/13/20 0151  PROT 5.6* 5.4* 6.0*  ALBUMIN 1.4* 1.4* 1.5*  AST 27 26 25  ALT 29 26 25  ALKPHOS 381* 295* 297*  BILITOT 1.0 1.1 1.0    Studies/Results: CT ANGIO CHEST PE W OR WO CONTRAST  Result Date: 11/12/2020 CLINICAL DATA:  PE suspected, shortness of breath EXAM: CT ANGIOGRAPHY CHEST WITH CONTRAST TECHNIQUE: Multidetector CT imaging of the chest was performed using the standard protocol during bolus administration of intravenous contrast. Multiplanar CT image reconstructions and MIPs were obtained to evaluate the vascular anatomy. CONTRAST:  50mL OMNIPAQUE IOHEXOL 350 MG/ML SOLN COMPARISON:  None. FINDINGS: Cardiovascular: Examination for pulmonary embolism is somewhat limited by breath motion artifact, particularly in the lung bases. Within this limitation, no evidence of pulmonary embolism through the proximal segmental pulmonary arterial level. No evidence of pulmonary embolism. Normal heart size. No pericardial effusion. Mediastinum/Nodes: No enlarged mediastinal, hilar, or axillary lymph nodes. Thyroid gland, trachea, and esophagus demonstrate no significant findings. Lungs/Pleura: Bandlike scarring and or atelectasis of the bilateral lung bases. No pleural effusion or pneumothorax. Upper Abdomen: No acute abnormality. Musculoskeletal: No chest wall abnormality. No acute or significant osseous findings. Review of the MIP images confirms the above findings. IMPRESSION: 1. Examination for pulmonary embolism is somewhat limited by breath motion artifact, particularly in the lung bases. Within this limitation, no evidence of pulmonary embolism through the proximal segmental pulmonary   arterial level. 2. Bandlike scarring and/or atelectasis of the bilateral lung bases. Electronically Signed   By: Eddie Candle M.D.   On: 11/12/2020 16:30   CT ABDOMEN PELVIS W CONTRAST  Result Date: 11/13/2020 CLINICAL DATA:  Acute severe pancreatitis. EXAM: CT  ABDOMEN AND PELVIS WITH CONTRAST TECHNIQUE: Multidetector CT imaging of the abdomen and pelvis was performed using the standard protocol following bolus administration of intravenous contrast. CONTRAST:  19mL OMNIPAQUE IOHEXOL 300 MG/ML  SOLN COMPARISON:  CT angio chest 11/12/2020, CT abdomen 11/05/2020 FINDINGS: Lower chest: Bilateral lower lobe linear atelectasis versus scarring. Bronchial wall thickening. Hepatobiliary: No focal liver abnormality. Status post cholecystectomy. Mild pneumobilia noted centrally on the left. Slightly improved mild intrahepatic biliary ductal dilatation on the left (5:45). Common bile duct stent in grossly appropriate position. Redemonstration of a gallbladder fossa surgical drain. Pancreas: Similar marked diffuse haziness of the pancreatic parenchyma, parenchymal edema, and peripancreatic fat stranding/free fluid. Again noted is poor opacification of the pancreatic parenchyma. Status post gastropancreatic pseudocyst stent with interval decrease in size of an approximately 5 cm (from 11 x 7 cm) proximal intraparenchymal pancreatic lesion now containing fluid and gas. This pseudocyst is again noted to be irregular in contour. Redemonstration of a very ill-defined poorly organized fluid collection within the transverse mesocolon (3:43) that measures approximately 9 cm in diameter. No definite new organized fluid collection. Spleen: Normal in size without focal abnormality. Adrenals/Urinary Tract: No adrenal nodule bilaterally. Bilateral kidneys enhance symmetrically. No hydronephrosis. No hydroureter. The urinary bladder is unremarkable. Stomach/Bowel: Stomach is within normal limits. No evidence of bowel wall thickening or dilatation. Stool throughout the colon. The appendix not definitely identified. Vascular/Lymphatic: No significant vascular findings are present. Multiple prominent mesenteric lymph nodes that are likely reactive in etiology. No enlarged abdominal or pelvic lymph  nodes. Reproductive: Prostate is unremarkable. Other: No intraperitoneal free gas. Musculoskeletal: Interval increase in subcutaneus soft tissue edema. No suspicious lytic or blastic osseous lesions. No acute displaced fracture. Degenerative changes at the L3-L4 level. IMPRESSION: 1. Persistent severe subacute necrotizing pancreatitis status post gastropancreatic pseudocyst stent placement with interval decrease in size of an approximately 5 cm (from 11 x 7 cm) intraparenchymal pancreatic lesion. 2. Interval slightly improved left intrahepatic biliary ductal dilatation. 3. Persistently very ill-defined and poorly organized approximately 9 cm in caliber fluid collection within the transverse mesocolon. 4. Status post cholecystectomy with gallbladder fossa surgical drain in similar position. 5. Stool throughout the colon. Electronically Signed   By: Iven Finn M.D.   On: 11/13/2020 06:36     ASSESMENT:   *   Postop bile leak.  ERCP w stent placement.  Post ERCP pancreatitis complicated byt large, complex pscyst.   11/07/2020 ERCP/EUS, axial stent placement, partial necrosectomy, double-pigtail stent placement.  Biliary stent had migrated proximally due to the pseudocyst. 11/09/2020 ERCP w repositioning of an original biliary stent and placement of second stent to CBD, necrosectomy. 11/11/2020 EGD, grade B esophagitis, 2 pigtail stents at incisura were removed, necrosectomy performed.  Ongoing extrinsic deformity of duodenum.  Plastic stents noted at ampulla. CT scan this morning showing persistent, severe necrotizing pancreatitis.  Interval decrease in size of pseudocyst at 5 cm, down from 11 x 7 cm.  Slight improvement in left intrahepatic ductal dilatation.  Ill-defined, poorly organized 9 cm fluid collection in the transverse mesocolon.  Surgical drain in gallbladder fossa, status post cholecystectomy.  Stool throughout colon. though leukocytosis persists, improved from 18.4 >> 18.7.  *    Constipation.  *  LE edema.  No DVT on Dopplers of 5/10  *    anemia.   PLAN   *   Dr Mansouraty set for 3rd EGD / necrosectomy tmrw at 1130.     Sarah Gribbin  11/13/2020, 11:08 AM Phone [phone]     Attending Physician Note   I have taken an interval history, reviewed the chart and examined the patient. I agree with the Advanced Practitioner's note, impression and recommendations.   Bile leak with 2 CBD stents in place. Pancreatitis c/b large necrotic pseudocyst for repeat EGD / necrosectomy Thursday at 1130. Further recommendations per Dr. Mansouraty.    Lorean Ekstrand, MD FACG [phone] 

## 2020-11-13 NOTE — Progress Notes (Signed)
PROGRESS NOTE    Steve Romero   GLO:[id number]  DOB: October 13, 1970  PCP: [name], DO    DOA: 11/01/2020 LOS: 12   Brief Narrative   Steve Romero is a 50 y.o. male with medical history significant for COPD, bipolar disorder, schizoaffective disorder and depression presented to the ED with persistent right-sided abdominal pain, N/V, poor appetite and early satiety.  Also noted increased JP drain output.   Initially admitted at [facility] on 4/29, transferred to [facility] for GI intervention.  Recent History: 10/09/20 cholecystectomy complicated by postop bile leak due to torn cystic duct that was clipped.  JP drain placed. 10/10/2020 ERCP with sphincterotomy and placement of plastic stent.  Developed post ERCP pancreatitis. Plan was for repeat ERCP and likely stent removal in 6 weeks.   Discharged after 4/6 - 4/13 admission.  Readmitted 4/29 from surgeons's office.   Pt has developed large, complex fluid collection at the pancreatic head, uncinate, proximal body.  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  The necrotic collection is causing mass-effect on the gastric antrum/pylorus/proximal duodenum with reactive small bowel thickening.  Stent in stable position.  Intrapelvic bile duct dilation is mild to moderate likely due to mass-effect from the fluid collection.  There is fluid edema surrounding the pancreas extending into the retroperitoneum and mesentery.  Reactive thickening in the colon.   Significant Events:  5/5 ERCP / EUS - see Op Note findings.    5/6 LFT's improved, jaundice resolved 5/7 ERCP - see op note. Prior stent accessible now and new one placed. Partial necrosectomy 5/9 ERCP - necrosectomy and replacement of stent  Assessment & Plan   Principal Problem:   Disorder of bile duct stent Active Problems:   Pancreatic necrosis   Elevated LFTs   Ascending cholangitis   Protein-calorie malnutrition, severe   Bile  leak   Post-ERCP Pancreatitis with necrotic fluid collection Bile leak status post cholecystectomy 4/6 -  JP drain in place. Initially admitted at [facility]. General surgery there Dr. Constance Haw d/w Dr. Rush Landmark who advised transfer to [facility] for possible advanced ERCP.  --GI following, see their recs --For repeat ERCP, further necrosectomy on 5/12 --Diet per GI --Continue empiric Merrem --Follow CBC --RD consult for supplement recs --Pain control, antiemetics PRN --Follow cultures from 5/5  Leukocytosis - wbc up over past couple of days. In setting of two procedures, suspect reactive.  Pt afebrile and has no other s/sx's of infection at this time.  Monitor CBC and clinically. Overall WBC count trending down  Bilateral lower extremity edema - progressive over past several days.  Good response to trial of IV Lasix 5/8. --Continue IV Lasix 40 mg to BID  --Monitor renal function and electrolytes --Duplex U/S LE's negative for DVT --D-dimer elevated, but suspect that is related to pancreatitis --Echo last month poor study, EF >75%, no mention of diastolic findings - Suspect hypoalbuminemia from poor po intake contributing to anasarca --Will start on albumin infusions with lasix --Likely needs oral diuretic at d/c at least PRN swelling.  COPD - stable, without no exacerbation sx's.   --Duonebs PRN  Schizoaffective disorder, Depression - stable. --resume Risperdal, trazodone  Obesity: Body mass index is 38.19 kg/m.   Complicates overall care and prognosis.  Recommend lifestyle modifications including physical activity and diet for weight loss and overall long-term health.   DVT prophylaxis: resume heparin, hold for any procedures   Diet:  Diet Orders (From admission, onward)    Start  Ordered   11/14/20 0001  Diet NPO time specified  Diet effective midnight        11/13/20 1351   11/12/20 1335  DIET SOFT Room service appropriate? Yes; Fluid consistency: Thin  Diet  effective now       Question Answer Comment  Room service appropriate? Yes   Fluid consistency: Thin      11/12/20 1334            Code Status: Full Code    Subjective 11/13/20    No nausea or vomiting. Tolerating diet. Still has some soreness in abdomen  Disposition Plan & Communication   Status is: Inpatient  Inpatient status appropriate due to severity of illness and ongoing diagnostic evaluation not appropriate for outpatient setting. Requires close monitoring and IV antibiotics.  May need further procedure/s.  Dispo: The patient is from: home              Anticipated d/c is to: home              Patient currently not medically stable for d/c   Difficult to place patient no   Consults, Procedures, Significant Events   Consultants:   Gastroenterology, Dr. Rush Landmark  Procedures:   5/5 - ERCP  5/7 - ERCP  5/9 - ERCP with necrosectomy  Antimicrobials:  Anti-infectives (From admission, onward)   Start     Dose/Rate Route Frequency Ordered Stop   11/11/20 1315  fluconazole (DIFLUCAN) tablet 200 mg  Status:  Discontinued        200 mg Oral Daily 11/11/20 1221 11/11/20 1226   11/11/20 1315  fluconazole (DIFLUCAN) tablet 400 mg        400 mg Oral Daily 11/11/20 1226     11/01/20 2200  piperacillin-tazobactam (ZOSYN) IVPB 3.375 g  Status:  Discontinued        3.375 g 12.5 mL/hr over 240 Minutes Intravenous Every 8 hours 11/01/20 1335 11/01/20 1406   11/01/20 1500  meropenem (MERREM) 1 g in sodium chloride 0.9 % 100 mL IVPB        1 g 200 mL/hr over 30 Minutes Intravenous Every 8 hours 11/01/20 1413     11/01/20 1430  piperacillin-tazobactam (ZOSYN) IVPB 3.375 g  Status:  Discontinued        3.375 g 100 mL/hr over 30 Minutes Intravenous  Once 11/01/20 1353 11/01/20 1406        Micro    Objective   Vitals:   11/13/20 0335 11/13/20 0740 11/13/20 1140 11/13/20 1602  BP: 134/80 119/65 116/77 137/70  Pulse: (!) 105 99 97 100  Resp: 20 17 18 19   Temp: 98.9  F (37.2 C) 98.2 F (36.8 C)  98.8 F (37.1 C)  TempSrc: Oral Oral Oral Oral  SpO2: 97% 98% 100% 98%  Weight:      Height:        Intake/Output Summary (Last 24 hours) at 11/13/2020 1652 Last data filed at 11/13/2020 0500 Gross per 24 hour  Intake --  Output 115 ml  Net -115 ml   Filed Weights   11/06/20 0109 11/08/20 0340 11/09/20 0353  Weight: 120.3 kg 118.9 kg 119 kg    Physical Exam:  General exam: Alert, awake, oriented x 3 Respiratory system: Clear to auscultation. Respiratory effort normal. Cardiovascular system:RRR. No murmurs, rubs, gallops. Gastrointestinal system: Abdomen is nondistended, soft and nontender. No organomegaly or masses felt. Normal bowel sounds heard. Central nervous system: Alert and oriented. No focal neurological deficits. Extremities:2+ pitting  edema bilaterally in LE Skin: No rashes, lesions or ulcers Psychiatry: Judgement and insight appear normal. Mood & affect appropriate.     Labs   Data Reviewed: I have personally reviewed following labs and imaging studies  CBC: Recent Labs  Lab 11/09/20 0036 11/10/20 0039 11/11/20 0313 11/12/20 0401 11/13/20 0151  WBC 14.8* 18.4* 16.1* 15.3* 14.7*  HGB 10.6* 10.6* 10.3* 9.6* 10.5*  HCT 33.4* 34.1* 33.1* 30.3* 33.1*  MCV 94.6 95.5 96.2 95.3 94.8  PLT 413* 426* 442* 421* [id number]*   Basic Metabolic Panel: Recent Labs  Lab 11/09/20 0036 11/10/20 0039 11/11/20 0313 11/12/20 0401 11/13/20 0151  NA 135 135 135 135 133*  K 4.2 4.3 4.0 4.4 4.2  CL 98 95* 95* 92* 93*  CO2 29 32 33* 36* 33*  GLUCOSE 122* 181* 152* 114* 162*  BUN 10 11 10 10 11   CREATININE 0.66 0.90 0.83 0.89 0.91  CALCIUM 7.8* 7.8* 7.9* 7.9* 7.9*   GFR: Estimated Creatinine Clearance: 126 mL/min (by C-G formula based on SCr of 0.91 mg/dL). Liver Function Tests: Recent Labs  Lab 11/09/20 0036 11/10/20 0039 11/11/20 0313 11/12/20 0401 11/13/20 0151  AST 25 23 27 26 25   ALT 41 33 29 26 25   ALKPHOS 546* 429* 381* 295*  297*  BILITOT 1.4* 1.2 1.0 1.1 1.0  PROT 5.2* 5.4* 5.6* 5.4* 6.0*  ALBUMIN 1.5* 1.5* 1.4* 1.4* 1.5*   No results for input(s): LIPASE, AMYLASE in the last 168 hours. No results for input(s): AMMONIA in the last 168 hours. Coagulation Profile: No results for input(s): INR, PROTIME in the last 168 hours. Cardiac Enzymes: No results for input(s): CKTOTAL, CKMB, CKMBINDEX, TROPONINI in the last 168 hours. BNP (last 3 results) No results for input(s): PROBNP in the last 8760 hours. HbA1C: No results for input(s): HGBA1C in the last 72 hours. CBG: Recent Labs  Lab 11/12/20 0754 11/12/20 1722 11/13/20 0846 11/13/20 1157 11/13/20 1600  GLUCAP 144* 112* 141* 121* 121*   Lipid Profile: No results for input(s): CHOL, HDL, LDLCALC, TRIG, CHOLHDL, LDLDIRECT in the last 72 hours. Thyroid Function Tests: No results for input(s): TSH, T4TOTAL, FREET4, T3FREE, THYROIDAB in the last 72 hours. Anemia Panel: No results for input(s): VITAMINB12, FOLATE, FERRITIN, TIBC, IRON, RETICCTPCT in the last 72 hours. Sepsis Labs: No results for input(s): PROCALCITON, LATICACIDVEN in the last 168 hours.  Recent Results (from the past 240 hour(s))  Aerobic/Anaerobic Culture w Gram Stain (surgical/deep wound)     Status: None   Collection Time: 11/07/20  3:03 PM   Specimen: PATH Cytology FNA; Body Fluid  Result Value Ref Range Status   Specimen Description FLUID PANCREATIC CYST DRAINAGE  Final   Special Requests A  Final   Gram Stain NO WBC SEEN RARE YEAST   Final   Culture   Final    MODERATE CANDIDA ALBICANS NO ANAEROBES ISOLATED Performed at [facility], [address], [place], [place] [address]    Report Status 11/12/2020 FINAL  Final      Imaging Studies   CT ANGIO CHEST PE W OR WO CONTRAST  Result Date: 11/12/2020 CLINICAL DATA:  PE suspected, shortness of breath EXAM: CT ANGIOGRAPHY CHEST WITH CONTRAST TECHNIQUE: Multidetector CT imaging of the chest was performed using the  standard protocol during bolus administration of intravenous contrast. Multiplanar CT image reconstructions and MIPs were obtained to evaluate the vascular anatomy. CONTRAST:  6mL OMNIPAQUE IOHEXOL 350 MG/ML SOLN COMPARISON:  None. FINDINGS: Cardiovascular: Examination for pulmonary embolism  is somewhat limited by breath motion artifact, particularly in the lung bases. Within this limitation, no evidence of pulmonary embolism through the proximal segmental pulmonary arterial level. No evidence of pulmonary embolism. Normal heart size. No pericardial effusion. Mediastinum/Nodes: No enlarged mediastinal, hilar, or axillary lymph nodes. Thyroid gland, trachea, and esophagus demonstrate no significant findings. Lungs/Pleura: Bandlike scarring and or atelectasis of the bilateral lung bases. No pleural effusion or pneumothorax. Upper Abdomen: No acute abnormality. Musculoskeletal: No chest wall abnormality. No acute or significant osseous findings. Review of the MIP images confirms the above findings. IMPRESSION: 1. Examination for pulmonary embolism is somewhat limited by breath motion artifact, particularly in the lung bases. Within this limitation, no evidence of pulmonary embolism through the proximal segmental pulmonary arterial level. 2. Bandlike scarring and/or atelectasis of the bilateral lung bases. Electronically Signed   By: Eddie Candle M.D.   On: 11/12/2020 16:30   CT ABDOMEN PELVIS W CONTRAST  Result Date: 11/13/2020 CLINICAL DATA:  Acute severe pancreatitis. EXAM: CT ABDOMEN AND PELVIS WITH CONTRAST TECHNIQUE: Multidetector CT imaging of the abdomen and pelvis was performed using the standard protocol following bolus administration of intravenous contrast. CONTRAST:  83mL OMNIPAQUE IOHEXOL 300 MG/ML  SOLN COMPARISON:  CT angio chest 11/12/2020, CT abdomen 11/05/2020 FINDINGS: Lower chest: Bilateral lower lobe linear atelectasis versus scarring. Bronchial wall thickening. Hepatobiliary: No focal liver  abnormality. Status post cholecystectomy. Mild pneumobilia noted centrally on the left. Slightly improved mild intrahepatic biliary ductal dilatation on the left (5:45). Common bile duct stent in grossly appropriate position. Redemonstration of a gallbladder fossa surgical drain. Pancreas: Similar marked diffuse haziness of the pancreatic parenchyma, parenchymal edema, and peripancreatic fat stranding/free fluid. Again noted is poor opacification of the pancreatic parenchyma. Status post gastropancreatic pseudocyst stent with interval decrease in size of an approximately 5 cm (from 11 x 7 cm) proximal intraparenchymal pancreatic lesion now containing fluid and gas. This pseudocyst is again noted to be irregular in contour. Redemonstration of a very ill-defined poorly organized fluid collection within the transverse mesocolon (3:43) that measures approximately 9 cm in diameter. No definite new organized fluid collection. Spleen: Normal in size without focal abnormality. Adrenals/Urinary Tract: No adrenal nodule bilaterally. Bilateral kidneys enhance symmetrically. No hydronephrosis. No hydroureter. The urinary bladder is unremarkable. Stomach/Bowel: Stomach is within normal limits. No evidence of bowel wall thickening or dilatation. Stool throughout the colon. The appendix not definitely identified. Vascular/Lymphatic: No significant vascular findings are present. Multiple prominent mesenteric lymph nodes that are likely reactive in etiology. No enlarged abdominal or pelvic lymph nodes. Reproductive: Prostate is unremarkable. Other: No intraperitoneal free gas. Musculoskeletal: Interval increase in subcutaneus soft tissue edema. No suspicious lytic or blastic osseous lesions. No acute displaced fracture. Degenerative changes at the L3-L4 level. IMPRESSION: 1. Persistent severe subacute necrotizing pancreatitis status post gastropancreatic pseudocyst stent placement with interval decrease in size of an approximately 5  cm (from 11 x 7 cm) intraparenchymal pancreatic lesion. 2. Interval slightly improved left intrahepatic biliary ductal dilatation. 3. Persistently very ill-defined and poorly organized approximately 9 cm in caliber fluid collection within the transverse mesocolon. 4. Status post cholecystectomy with gallbladder fossa surgical drain in similar position. 5. Stool throughout the colon. Electronically Signed   By: Iven Finn M.D.   On: 11/13/2020 06:36   VAS [place] LOWER EXTREMITY VENOUS (DVT)  Result Date: 11/12/2020  Lower Venous DVT Study Patient Name:  TORI GREWELL  Date of Exam:   11/12/2020 Medical Rec #: [id number]  Accession #:    [id number] Date of Birth: 1971/04/05       Patient Gender: M Patient Age:   26Y Exam Location:  [facility] Procedure:      VAS [place] LOWER EXTREMITY VENOUS (DVT) Referring Phys: [id number] KELLY A GRIFFITH --------------------------------------------------------------------------------  Indications: Edema.  Comparison Study: No previous exams Performing Technologist: Rogelia Rohrer  Examination Guidelines: A complete evaluation includes B-mode imaging, spectral Doppler, color Doppler, and power Doppler as needed of all accessible portions of each vessel. Bilateral testing is considered an integral part of a complete examination. Limited examinations for reoccurring indications may be performed as noted. The reflux portion of the exam is performed with the patient in reverse Trendelenburg.  +---------+---------------+---------+-----------+----------+--------------+ RIGHT    CompressibilityPhasicitySpontaneityPropertiesThrombus Aging +---------+---------------+---------+-----------+----------+--------------+ CFV      Full           Yes      Yes                                 +---------+---------------+---------+-----------+----------+--------------+ SFJ      Full                                                         +---------+---------------+---------+-----------+----------+--------------+ FV Prox  Full           Yes      Yes                                 +---------+---------------+---------+-----------+----------+--------------+ FV Mid   Full           Yes      Yes                                 +---------+---------------+---------+-----------+----------+--------------+ FV DistalFull           Yes      Yes                                 +---------+---------------+---------+-----------+----------+--------------+ PFV      Full                                                        +---------+---------------+---------+-----------+----------+--------------+ POP      Full           Yes      Yes                                 +---------+---------------+---------+-----------+----------+--------------+ PTV      Full                                                        +---------+---------------+---------+-----------+----------+--------------+ PERO  Full                                                        +---------+---------------+---------+-----------+----------+--------------+   +---------+---------------+---------+-----------+----------+--------------+ LEFT     CompressibilityPhasicitySpontaneityPropertiesThrombus Aging +---------+---------------+---------+-----------+----------+--------------+ CFV      Full           Yes      Yes                                 +---------+---------------+---------+-----------+----------+--------------+ SFJ      Full                                                        +---------+---------------+---------+-----------+----------+--------------+ FV Prox  Full           Yes      Yes                                 +---------+---------------+---------+-----------+----------+--------------+ FV Mid   Full           Yes      Yes                                  +---------+---------------+---------+-----------+----------+--------------+ FV DistalFull           Yes      Yes                                 +---------+---------------+---------+-----------+----------+--------------+ PFV      Full                                                        +---------+---------------+---------+-----------+----------+--------------+ POP      Full           Yes      Yes                                 +---------+---------------+---------+-----------+----------+--------------+ PTV      Full                                                        +---------+---------------+---------+-----------+----------+--------------+ PERO     Full                                                        +---------+---------------+---------+-----------+----------+--------------+     Summary:  BILATERAL: - No evidence of deep vein thrombosis seen in the lower extremities, bilaterally. - No evidence of superficial venous thrombosis in the lower extremities, bilaterally. -No evidence of popliteal cyst, bilaterally.   *See table(s) above for measurements and observations. Electronically signed by Curt Jews MD on 11/12/2020 at 3:48:23 PM.    Final      Medications   Scheduled Meds: . feeding supplement  237 mL Oral TID BM  . fluconazole  400 mg Oral Daily  . furosemide  40 mg Intravenous BID  . heparin injection (subcutaneous)  5,000 Units Subcutaneous Q8H  . indomethacin  100 mg Rectal Once  . multivitamin with minerals  1 tablet Oral Daily  . polyethylene glycol  17 g Oral Daily  . risperiDONE  1 mg Oral QHS  . senna-docusate  1 tablet Oral BID  . traZODone  150 mg Oral QHS   Continuous Infusions: . albumin human    . meropenem (MERREM) IV 1 g (11/13/20 1457)       LOS: 12 days    Time spent: 30 minutes with >50% spent at bedside and in coordination of care.     Kathie Dike, MD [facility]  11/13/2020, 4:52 PM      If 7PM-7AM,  please contact night-coverage. How to contact the [facility] Attending or Consulting provider [place] or covering provider during after hours [place], for this patient?    1. Check the care team in [facility] and look for a) attending/consulting [facility] provider listed and b) the [facility] team listed 2. Log into [email or web address] and use [place]'s universal password to access. If you do not have the password, please contact the hospital operator. 3. Locate the [facility] provider you are looking for under [facility] and page to a number that you can be directly reached. 4. If you still have difficulty reaching the provider, please page the [facility] (Director on Call) for the Hospitalists listed on amion for assistance.

## 2020-11-13 NOTE — Progress Notes (Signed)
Mobility Specialist: Progress Note   11/13/20 1557  Mobility  Activity Ambulated in hall  Level of Assistance Independent  Assistive Device None  Distance Ambulated (ft) 940 ft  Mobility Ambulated independently in hallway  Mobility Response Tolerated well  Mobility performed by Mobility specialist  $Mobility charge 1 Mobility   During Mobility: 118 HR Post-Mobility: 110 HR  Pt asx during ambulation. Pt to BR after walk and then sitting EOB. Pt did experience LOB walking out of the bathroom, minA to recover, RN notified.   [facility] Banesa Tristan Mobility Specialist Mobility Specialist Phone: [phone]

## 2020-11-14 ENCOUNTER — Telehealth: Payer: Self-pay

## 2020-11-14 ENCOUNTER — Encounter (HOSPITAL_COMMUNITY): Payer: Self-pay

## 2020-11-14 ENCOUNTER — Inpatient Hospital Stay (HOSPITAL_COMMUNITY): Payer: Medicaid Other | Admitting: Certified Registered Nurse Anesthetist

## 2020-11-14 ENCOUNTER — Encounter (HOSPITAL_COMMUNITY)
Admission: RE | Disposition: A | Discharge status: Home / Self Care | Payer: Self-pay | Source: Ambulatory Visit | Attending: Internal Medicine

## 2020-11-14 DIAGNOSIS — K862 Cyst of pancreas: Secondary | ICD-10-CM | POA: Diagnosis not present

## 2020-11-14 DIAGNOSIS — K8689 Other specified diseases of pancreas: Secondary | ICD-10-CM

## 2020-11-14 DIAGNOSIS — K839 Disease of biliary tract, unspecified: Secondary | ICD-10-CM | POA: Diagnosis not present

## 2020-11-14 DIAGNOSIS — K863 Pseudocyst of pancreas: Secondary | ICD-10-CM | POA: Diagnosis not present

## 2020-11-14 DIAGNOSIS — E43 Unspecified severe protein-calorie malnutrition: Secondary | ICD-10-CM | POA: Diagnosis not present

## 2020-11-14 DIAGNOSIS — K209 Esophagitis, unspecified without bleeding: Secondary | ICD-10-CM | POA: Diagnosis not present

## 2020-11-14 DIAGNOSIS — K3189 Other diseases of stomach and duodenum: Secondary | ICD-10-CM | POA: Diagnosis not present

## 2020-11-14 HISTORY — PX: EUS: SHX5427

## 2020-11-14 HISTORY — PX: BILIARY STENT PLACEMENT: SHX5538

## 2020-11-14 HISTORY — PX: ESOPHAGOGASTRODUODENOSCOPY: SHX5428

## 2020-11-14 HISTORY — PX: CYST GASTROSTOMY: SHX6862

## 2020-11-14 HISTORY — PX: STENT REMOVAL: SHX6421

## 2020-11-14 LAB — COMPREHENSIVE METABOLIC PANEL
ALT: 21 U/L (ref 0–44)
AST: 19 U/L (ref 15–41)
Albumin: 2.1 g/dL — ABNORMAL LOW (ref 3.5–5.0)
Alkaline Phosphatase: 234 U/L — ABNORMAL HIGH (ref 38–126)
Anion gap: 11 (ref 5–15)
BUN: 10 mg/dL (ref 6–20)
CO2: 31 mmol/L (ref 22–32)
Calcium: 8.3 mg/dL — ABNORMAL LOW (ref 8.9–10.3)
Chloride: 92 mmol/L — ABNORMAL LOW (ref 98–111)
Creatinine, Ser: 0.81 mg/dL (ref 0.61–1.24)
GFR, Estimated: >60 mL/min (ref >60)
Glucose, Bld: 124 mg/dL — ABNORMAL HIGH (ref 70–99)
Potassium: 4.1 mmol/L (ref 3.5–5.1)
Sodium: 134 mmol/L — ABNORMAL LOW (ref 135–145)
Total Bilirubin: 0.8 mg/dL (ref 0.3–1.2)
Total Protein: 6.3 g/dL — ABNORMAL LOW (ref 6.5–8.1)

## 2020-11-14 LAB — CBC
HCT: 29 % — ABNORMAL LOW (ref 39.0–52.0)
Hemoglobin: 9.3 g/dL — ABNORMAL LOW (ref 13.0–17.0)
MCH: 30.1 pg (ref 26.0–34.0)
MCHC: 32.1 g/dL (ref 30.0–36.0)
MCV: 93.9 fL (ref 80.0–100.0)
Platelets: 395 10*3/uL (ref 150–400)
RBC: 3.09 MIL/uL — ABNORMAL LOW (ref 4.22–5.81)
RDW: 13.7 % (ref 11.5–15.5)
WBC: 15.5 10*3/uL — ABNORMAL HIGH (ref 4.0–10.5)
nRBC: 0.1 % (ref 0.0–0.2)

## 2020-11-14 SURGERY — EGD (ESOPHAGOGASTRODUODENOSCOPY)
Anesthesia: General

## 2020-11-14 MED ORDER — MIDAZOLAM HCL 5 MG/5ML IJ SOLN
INTRAMUSCULAR | Status: DC | PRN
  Administered 2020-11-14: 2 mg via INTRAVENOUS

## 2020-11-14 MED ORDER — SUGAMMADEX SODIUM 200 MG/2ML IV SOLN
INTRAVENOUS | Status: DC | PRN
  Administered 2020-11-14: 200 mg via INTRAVENOUS

## 2020-11-14 MED ORDER — LACTATED RINGERS IV SOLN: INTRAVENOUS | Status: DC | PRN

## 2020-11-14 MED ORDER — PROPOFOL 10 MG/ML IV BOLUS
INTRAVENOUS | Status: DC | PRN
  Administered 2020-11-14: 120 mg via INTRAVENOUS

## 2020-11-14 MED ORDER — LIDOCAINE 2% (20 MG/ML) 5 ML SYRINGE
INTRAMUSCULAR | Status: DC | PRN
  Administered 2020-11-14: 80 mg via INTRAVENOUS

## 2020-11-14 MED ORDER — ENSURE ENLIVE PO LIQD
237.0000 mL | Freq: Two times a day (BID) | ORAL | Status: DC
  Administered 2020-11-15: 237 mL via ORAL

## 2020-11-14 MED ORDER — ONDANSETRON HCL 4 MG/2ML IJ SOLN
INTRAMUSCULAR | Status: DC | PRN
  Administered 2020-11-14: 4 mg via INTRAVENOUS

## 2020-11-14 MED ORDER — ROCURONIUM BROMIDE 100 MG/10ML IV SOLN
INTRAVENOUS | Status: DC | PRN
  Administered 2020-11-14: 60 mg via INTRAVENOUS

## 2020-11-14 MED ORDER — PROSOURCE PLUS PO LIQD
30.0000 mL | Freq: Two times a day (BID) | ORAL | Status: DC
  Administered 2020-11-15: 30 mL via ORAL
  Filled 2020-11-14: qty 30

## 2020-11-14 MED ORDER — PHENYLEPHRINE HCL (PRESSORS) 10 MG/ML IV SOLN
INTRAVENOUS | Status: DC | PRN
  Administered 2020-11-14 (×2): 80 ug via INTRAVENOUS

## 2020-11-14 MED ORDER — FENTANYL CITRATE (PF) 100 MCG/2ML IJ SOLN
INTRAMUSCULAR | Status: DC | PRN
  Administered 2020-11-14: 100 ug via INTRAVENOUS

## 2020-11-14 NOTE — Telephone Encounter (Signed)
-----   Message from Irving Copas., MD sent at 11/14/2020  1:10 PM EDT ----- Regarding: Follow-up Steve Romero, This patient needs a CT abdomen/pelvis with oral contrast.  Okay to not do IV contrast.  This CT scan needs to be performed in 2.5 to 3 weeks. Patient will need an EGD with AXIOS stent pull 1 week after the CT scan is performed in the hospital.  This only requires a 45-minute procedure but does require fluoroscopy availability. The patient is going to remain in the hospital over the weekend at a minimum.  If we can work on having this arranged so that it is on his discharge paperwork that would be great. Patient should be scheduled for a follow-up in clinic. Patient needs an ERCP for bile leak in 2 to 3 months with me. Thanks. GM

## 2020-11-14 NOTE — Transfer of Care (Signed)
Immediate Anesthesia Transfer of Care Note  Patient: Steve Romero  Procedure(s) Performed: ESOPHAGOGASTRODUODENOSCOPY (EGD) with Necrosectomy (N/A ) UPPER ENDOSCOPIC ULTRASOUND (EUS) LINEAR  Patient Location: Endoscopy Unit  Anesthesia Type:General  Level of Consciousness: drowsy and patient cooperative  Airway & Oxygen Therapy: Patient Spontanous Breathing and Patient connected to nasal cannula oxygen  Post-op Assessment: Report given to RN, Post -op Vital signs reviewed and stable and Patient moving all extremities X 4  Post vital signs: Reviewed and stable  Last Vitals:  Vitals Value Taken Time  BP 114/56 11/14/20 1251  Temp    Pulse 99 11/14/20 1251  Resp 18 11/14/20 1251  SpO2 93 % 11/14/20 1251    Last Pain:  Vitals:   11/14/20 1251  TempSrc:   PainSc: Asleep      Patients Stated Pain Goal: 0 (16/38/46 6599)  Complications: No complications documented.

## 2020-11-14 NOTE — Progress Notes (Signed)
PROGRESS NOTE    Steve Romero   ZOX:[id number]  DOB: September 12, 1970  PCP: [name], DO    DOA: 11/01/2020 LOS: 59   Brief Narrative   Steve Romero is a 50 y.o. male with medical history significant for COPD, bipolar disorder, schizoaffective disorder and depression presented to the ED with persistent right-sided abdominal pain, N/V, poor appetite and early satiety.  Also noted increased JP drain output.   Initially admitted at [facility] on 4/29, transferred to [facility] for GI intervention.  Recent History: 10/09/20 cholecystectomy complicated by postop bile leak due to torn cystic duct that was clipped.  JP drain placed. 10/10/2020 ERCP with sphincterotomy and placement of plastic stent.  Developed post ERCP pancreatitis. Plan was for repeat ERCP and likely stent removal in 6 weeks.   Discharged after 4/6 - 4/13 admission.  Readmitted 4/29 from surgeons's office.   Pt has developed large, complex fluid collection at the pancreatic head, uncinate, proximal body.  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  The necrotic collection is causing mass-effect on the gastric antrum/pylorus/proximal duodenum with reactive small bowel thickening.  Stent in stable position.  Intrapelvic bile duct dilation is mild to moderate likely due to mass-effect from the fluid collection.  There is fluid edema surrounding the pancreas extending into the retroperitoneum and mesentery.  Reactive thickening in the colon.   Significant Events:  5/5 ERCP / EUS - see Op Note findings.    5/6 LFT's improved, jaundice resolved 5/7 ERCP - see op note. Prior stent accessible now and new one placed. Partial necrosectomy 5/9 ERCP - necrosectomy and replacement of stent 5/12 ERCP  Assessment & Plan   Principal Problem:   Disorder of bile duct stent Active Problems:   Pancreatic necrosis   Elevated LFTs   Ascending cholangitis   Protein-calorie malnutrition, severe    Bile leak   Post-ERCP Pancreatitis with necrotic fluid collection 4/7 Bile leak status post cholecystectomy 4/6 -  JP drain in place and will be followed up by Dr. Constance Haw after discharge. Initially admitted at [facility]. General surgery there Dr. Constance Haw d/w Dr. Rush Landmark who advised transfer to [facility] for possible advanced ERCP.  --GI following, see their recs --For repeat ERCP, further necrosectomy on 5/12 --Diet per GI --Continue empiric Merrem --Follow CBC --RD consult for supplement recs --Pain control, antiemetics PRN --cultures from pancreatic cyst fluid shows moderate candida albicans -continue on diflucan -will request ID input regarding further antibiotics  Leukocytosis - wbc up over past couple of days. In setting of two procedures, suspect reactive.  Pt afebrile and has no other s/sx's of infection at this time.  Monitor CBC and clinically. Clinically he does not appear to be septic or toxic  Bilateral lower extremity edema - progressive over past several days.  Good response to trial of IV Lasix 5/8. --Continue IV Lasix 40 mg to BID  --Monitor renal function and electrolytes --Duplex U/S LE's negative for DVT --D-dimer elevated, but suspect that is related to pancreatitis --Echo last month poor study, EF >75%, no mention of diastolic findings - Suspect hypoalbuminemia from poor po intake contributing to anasarca --Started on albumin infusions with lasix -continue to monitor urine output --Likely needs oral diuretic at d/c at least PRN swelling.  COPD - stable, without no exacerbation sx's.   --Duonebs PRN  Schizoaffective disorder, Depression - stable. --resume Risperdal, trazodone  Obesity: Body mass index is 38.19 kg/m.   Complicates overall care and prognosis.  Recommend  lifestyle modifications including physical activity and diet for weight loss and overall long-term health.   DVT prophylaxis: resume heparin, hold for any procedures   Diet:  Diet  Orders (From admission, onward)    Start     Ordered   11/14/20 0001  Diet NPO time specified  Diet effective midnight        11/13/20 1351            Code Status: Full Code    Subjective 11/14/20    He has been npo since midnight. Still has some soreness in abdomen. Reports good urine output but still has significant LE edema  Disposition Plan & Communication   Status is: Inpatient  Inpatient status appropriate due to severity of illness and ongoing diagnostic evaluation not appropriate for outpatient setting. Requires close monitoring and IV antibiotics.  May need further procedure/s.  Dispo: The patient is from: home              Anticipated d/c is to: home              Patient currently not medically stable for d/c   Difficult to place patient no   Consults, Procedures, Significant Events   Consultants:   Gastroenterology, Dr. Rush Landmark  Procedures:   5/5 - ERCP  5/7 - ERCP  5/9 - ERCP with necrosectomy  5/12 ERCP  Antimicrobials:  Anti-infectives (From admission, onward)   Start     Dose/Rate Route Frequency Ordered Stop   11/11/20 1315  fluconazole (DIFLUCAN) tablet 200 mg  Status:  Discontinued        200 mg Oral Daily 11/11/20 1221 11/11/20 1226   11/11/20 1315  [MAR Hold]  fluconazole (DIFLUCAN) tablet 400 mg        (MAR Hold since Thu 11/14/2020 at 1028.Hold Reason: Transfer to a Procedural area.)   400 mg Oral Daily 11/11/20 1226     11/01/20 2200  piperacillin-tazobactam (ZOSYN) IVPB 3.375 g  Status:  Discontinued        3.375 g 12.5 mL/hr over 240 Minutes Intravenous Every 8 hours 11/01/20 1335 11/01/20 1406   11/01/20 1500  [MAR Hold]  meropenem (MERREM) 1 g in sodium chloride 0.9 % 100 mL IVPB        (MAR Hold since Thu 11/14/2020 at 1028.Hold Reason: Transfer to a Procedural area.)   1 g 200 mL/hr over 30 Minutes Intravenous Every 8 hours 11/01/20 1413     11/01/20 1430  piperacillin-tazobactam (ZOSYN) IVPB 3.375 g  Status:  Discontinued         3.375 g 100 mL/hr over 30 Minutes Intravenous  Once 11/01/20 1353 11/01/20 1406        Micro    Objective   Vitals:   11/14/20 0453 11/14/20 0500 11/14/20 0812 11/14/20 1030  BP: 133/71  116/77 138/77  Pulse: 89   94  Resp: 16  18 12   Temp: 98.5 F (36.9 C)  99 F (37.2 C) 98.8 F (37.1 C)  TempSrc: Oral  Oral Oral  SpO2: 98% 98% 98% 94%  Weight:      Height:        Intake/Output Summary (Last 24 hours) at 11/14/2020 1244 Last data filed at 11/14/2020 0810 Gross per 24 hour  Intake 1014.25 ml  Output 1220 ml  Net -205.75 ml   Filed Weights   11/06/20 0109 11/08/20 0340 11/09/20 0353  Weight: 120.3 kg 118.9 kg 119 kg    Physical Exam:  General exam: Alert, awake,  oriented x 3 Respiratory system: Clear to auscultation. Respiratory effort normal. Cardiovascular system:RRR. No murmurs, rubs, gallops. Gastrointestinal system: Abdomen is nondistended, soft and nontender. No organomegaly or masses felt. Normal bowel sounds heard. Central nervous system: Alert and oriented. No focal neurological deficits. Extremities: 2+ pitting edema bilaterally Skin: No rashes, lesions or ulcers Psychiatry: Judgement and insight appear normal. Mood & affect appropriate.      Labs   Data Reviewed: I have personally reviewed following labs and imaging studies  CBC: Recent Labs  Lab 11/10/20 0039 11/11/20 0313 11/12/20 0401 11/13/20 0151 11/14/20 0130  WBC 18.4* 16.1* 15.3* 14.7* 15.5*  HGB 10.6* 10.3* 9.6* 10.5* 9.3*  HCT 34.1* 33.1* 30.3* 33.1* 29.0*  MCV 95.5 96.2 95.3 94.8 93.9  PLT 426* 442* 421* 437* 440   Basic Metabolic Panel: Recent Labs  Lab 11/10/20 0039 11/11/20 0313 11/12/20 0401 11/13/20 0151 11/14/20 0130  NA 135 135 135 133* 134*  K 4.3 4.0 4.4 4.2 4.1  CL 95* 95* 92* 93* 92*  CO2 32 33* 36* 33* 31  GLUCOSE 181* 152* 114* 162* 124*  BUN 11 10 10 11 10   CREATININE 0.90 0.83 0.89 0.91 0.81  CALCIUM 7.8* 7.9* 7.9* 7.9* 8.3*   GFR: Estimated  Creatinine Clearance: 141.5 mL/min (by C-G formula based on SCr of 0.81 mg/dL). Liver Function Tests: Recent Labs  Lab 11/10/20 0039 11/11/20 0313 11/12/20 0401 11/13/20 0151 11/14/20 0130  AST 23 27 26 25 19   ALT 33 29 26 25 21   ALKPHOS 429* 381* 295* 297* 234*  BILITOT 1.2 1.0 1.1 1.0 0.8  PROT 5.4* 5.6* 5.4* 6.0* 6.3*  ALBUMIN 1.5* 1.4* 1.4* 1.5* 2.1*   No results for input(s): LIPASE, AMYLASE in the last 168 hours. No results for input(s): AMMONIA in the last 168 hours. Coagulation Profile: No results for input(s): INR, PROTIME in the last 168 hours. Cardiac Enzymes: No results for input(s): CKTOTAL, CKMB, CKMBINDEX, TROPONINI in the last 168 hours. BNP (last 3 results) No results for input(s): PROBNP in the last 8760 hours. HbA1C: No results for input(s): HGBA1C in the last 72 hours. CBG: Recent Labs  Lab 11/12/20 0754 11/12/20 1722 11/13/20 0846 11/13/20 1157 11/13/20 1600  GLUCAP 144* 112* 141* 121* 121*   Lipid Profile: No results for input(s): CHOL, HDL, LDLCALC, TRIG, CHOLHDL, LDLDIRECT in the last 72 hours. Thyroid Function Tests: No results for input(s): TSH, T4TOTAL, FREET4, T3FREE, THYROIDAB in the last 72 hours. Anemia Panel: No results for input(s): VITAMINB12, FOLATE, FERRITIN, TIBC, IRON, RETICCTPCT in the last 72 hours. Sepsis Labs: No results for input(s): PROCALCITON, LATICACIDVEN in the last 168 hours.  Recent Results (from the past 240 hour(s))  Aerobic/Anaerobic Culture w Gram Stain (surgical/deep wound)     Status: None   Collection Time: 11/07/20  3:03 PM   Specimen: PATH Cytology FNA; Body Fluid  Result Value Ref Range Status   Specimen Description FLUID PANCREATIC CYST DRAINAGE  Final   Special Requests A  Final   Gram Stain NO WBC SEEN RARE YEAST   Final   Culture   Final    MODERATE CANDIDA ALBICANS NO ANAEROBES ISOLATED Performed at [facility], [address], [place], [place] [address]    Report Status 11/12/2020  FINAL  Final      Imaging Studies   CT ANGIO CHEST PE W OR WO CONTRAST  Result Date: 11/12/2020 CLINICAL DATA:  PE suspected, shortness of breath EXAM: CT ANGIOGRAPHY CHEST WITH CONTRAST TECHNIQUE: Multidetector CT  imaging of the chest was performed using the standard protocol during bolus administration of intravenous contrast. Multiplanar CT image reconstructions and MIPs were obtained to evaluate the vascular anatomy. CONTRAST:  2mL OMNIPAQUE IOHEXOL 350 MG/ML SOLN COMPARISON:  None. FINDINGS: Cardiovascular: Examination for pulmonary embolism is somewhat limited by breath motion artifact, particularly in the lung bases. Within this limitation, no evidence of pulmonary embolism through the proximal segmental pulmonary arterial level. No evidence of pulmonary embolism. Normal heart size. No pericardial effusion. Mediastinum/Nodes: No enlarged mediastinal, hilar, or axillary lymph nodes. Thyroid gland, trachea, and esophagus demonstrate no significant findings. Lungs/Pleura: Bandlike scarring and or atelectasis of the bilateral lung bases. No pleural effusion or pneumothorax. Upper Abdomen: No acute abnormality. Musculoskeletal: No chest wall abnormality. No acute or significant osseous findings. Review of the MIP images confirms the above findings. IMPRESSION: 1. Examination for pulmonary embolism is somewhat limited by breath motion artifact, particularly in the lung bases. Within this limitation, no evidence of pulmonary embolism through the proximal segmental pulmonary arterial level. 2. Bandlike scarring and/or atelectasis of the bilateral lung bases. Electronically Signed   By: Lauralyn Primes M.D.   On: 11/12/2020 16:30   CT ABDOMEN PELVIS W CONTRAST  Result Date: 11/13/2020 CLINICAL DATA:  Acute severe pancreatitis. EXAM: CT ABDOMEN AND PELVIS WITH CONTRAST TECHNIQUE: Multidetector CT imaging of the abdomen and pelvis was performed using the standard protocol following bolus administration of  intravenous contrast. CONTRAST:  71mL OMNIPAQUE IOHEXOL 300 MG/ML  SOLN COMPARISON:  CT angio chest 11/12/2020, CT abdomen 11/05/2020 FINDINGS: Lower chest: Bilateral lower lobe linear atelectasis versus scarring. Bronchial wall thickening. Hepatobiliary: No focal liver abnormality. Status post cholecystectomy. Mild pneumobilia noted centrally on the left. Slightly improved mild intrahepatic biliary ductal dilatation on the left (5:45). Common bile duct stent in grossly appropriate position. Redemonstration of a gallbladder fossa surgical drain. Pancreas: Similar marked diffuse haziness of the pancreatic parenchyma, parenchymal edema, and peripancreatic fat stranding/free fluid. Again noted is poor opacification of the pancreatic parenchyma. Status post gastropancreatic pseudocyst stent with interval decrease in size of an approximately 5 cm (from 11 x 7 cm) proximal intraparenchymal pancreatic lesion now containing fluid and gas. This pseudocyst is again noted to be irregular in contour. Redemonstration of a very ill-defined poorly organized fluid collection within the transverse mesocolon (3:43) that measures approximately 9 cm in diameter. No definite new organized fluid collection. Spleen: Normal in size without focal abnormality. Adrenals/Urinary Tract: No adrenal nodule bilaterally. Bilateral kidneys enhance symmetrically. No hydronephrosis. No hydroureter. The urinary bladder is unremarkable. Stomach/Bowel: Stomach is within normal limits. No evidence of bowel wall thickening or dilatation. Stool throughout the colon. The appendix not definitely identified. Vascular/Lymphatic: No significant vascular findings are present. Multiple prominent mesenteric lymph nodes that are likely reactive in etiology. No enlarged abdominal or pelvic lymph nodes. Reproductive: Prostate is unremarkable. Other: No intraperitoneal free gas. Musculoskeletal: Interval increase in subcutaneus soft tissue edema. No suspicious lytic or  blastic osseous lesions. No acute displaced fracture. Degenerative changes at the L3-L4 level. IMPRESSION: 1. Persistent severe subacute necrotizing pancreatitis status post gastropancreatic pseudocyst stent placement with interval decrease in size of an approximately 5 cm (from 11 x 7 cm) intraparenchymal pancreatic lesion. 2. Interval slightly improved left intrahepatic biliary ductal dilatation. 3. Persistently very ill-defined and poorly organized approximately 9 cm in caliber fluid collection within the transverse mesocolon. 4. Status post cholecystectomy with gallbladder fossa surgical drain in similar position. 5. Stool throughout the colon. Electronically Signed   By: Blanchie Serve  Mckinley Jewel M.D.   On: 11/13/2020 06:36     Medications   Scheduled Meds: . [MAR Hold] feeding supplement  237 mL Oral TID BM  . [MAR Hold] fluconazole  400 mg Oral Daily  . [MAR Hold] furosemide  40 mg Intravenous BID  . [MAR Hold] indomethacin  100 mg Rectal Once  . [MAR Hold] multivitamin with minerals  1 tablet Oral Daily  . [MAR Hold] polyethylene glycol  17 g Oral Daily  . [MAR Hold] risperiDONE  1 mg Oral QHS  . [MAR Hold] senna-docusate  1 tablet Oral BID  . [MAR Hold] traZODone  150 mg Oral QHS   Continuous Infusions: . [MAR Hold] albumin human 25 g (11/14/20 0540)  . [MAR Hold] meropenem (MERREM) IV Stopped (11/14/20 0533)       LOS: 13 days    Time spent: 30 minutes with >50% spent at bedside and in coordination of care.     Kathie Dike, MD [facility]  11/14/2020, 12:44 PM      If 7PM-7AM, please contact night-coverage. How to contact the [facility] Attending or Consulting provider [place] or covering provider during after hours [place], for this patient?    1. Check the care team in [facility] and look for a) attending/consulting [facility] provider listed and b) the [facility] team listed 2. Log into [email or web address] and use [place]'s universal password to access. If you do not have the password, please  contact the hospital operator. 3. Locate the [facility] provider you are looking for under [facility] and page to a number that you can be directly reached. 4. If you still have difficulty reaching the provider, please page the [facility] (Director on Call) for the Hospitalists listed on amion for assistance.

## 2020-11-14 NOTE — Anesthesia Procedure Notes (Signed)
Procedure Name: Intubation Date/Time: 11/14/2020 11:18 AM Performed by: Terrence Dupont, CRNA Pre-anesthesia Checklist: Patient identified, Emergency Drugs available, Suction available and Patient being monitored Patient Re-evaluated:Patient Re-evaluated prior to induction Oxygen Delivery Method: Circle system utilized Preoxygenation: Pre-oxygenation with 100% oxygen Induction Type: IV induction Ventilation: Mask ventilation without difficulty Laryngoscope Size: Mac and 4 Grade View: Grade I Tube type: Oral Tube size: 7.5 mm Number of attempts: 1 Airway Equipment and Method: Stylet Placement Confirmation: ETT inserted through vocal cords under direct vision,  positive ETCO2 and breath sounds checked- equal and bilateral Secured at: 22 cm Tube secured with: Tape Dental Injury: Teeth and Oropharynx as per pre-operative assessment

## 2020-11-14 NOTE — Telephone Encounter (Signed)
Order for CT entered and sent to the schedulers, Recall for ERCP entered.  Follow up for 12/24/20 at 950 am with Dr Rush Landmark. EGD will be scheduled as soon as we have CT date.

## 2020-11-14 NOTE — Consult Note (Signed)
[facility]  Total days of antibiotics 14/day 3 fluconazole               Reason for Consult: necrotizing pancreatitis    Referring Physician: memon  Principal Problem:   Disorder of bile duct stent Active Problems:   Pancreatic necrosis   Elevated LFTs   Ascending cholangitis   Protein-calorie malnutrition, severe   Bile leak    HPI: Steve Romero is a 50 y.o. male with history of HLD, COPD, recently had symptomatic cholelithiasis s/p laparoscopic gallbladder removal with OR report noticing empyema of the gallbladder, torn cystic duct, with intrahepatic bill leak on 4/6. He had ERCP to stent cystic duct but developed post ERCP pancreatitis. He had biliary drain placed to help drain fluid from pancreatitis/bile. He was discharged on 4/12 on 5 day course of amox/clav. He was readmitted on 4/29 due to leukocytosis of 18k, elevated bilirubin and transaminitis, given concern for ascending cholangitis 2/2 stent obstruction/dislodgment. CT scan showed necrotic area of pancreatic head and mass effect on biliary system. He was started on meropenem and still had worsening fevers, transaminitis, jaundice, despite IV abtx. Repeat CT on 5/3 showing ongoing necrotizing pancreatitis, and large irregular necrotic fluid collection, possible pseudocyst formation.he was transferred to [facility] for dr Rush Landmark to do advanced edno assessment and manipulation of biliary drains and necrosectomy to help manage necrotic pancreatitis. Patient cx showing c.albicans ( though he had been on meropenem at the time). Patient tolerated today's procedure. He is able to tolerate eating without too much difficulty. He has one jp drain in place with serous fluid in the bulb. Wbc down to 15.5K from 18.4. 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 Left intrahepatic biliary  ductal dilatation improved. Large ill-defined 9cm fluid collection within the transverse mesocolon.  Past Medical History:  Diagnosis Date  . Anxiety 10-2014  . Bipolar disorder ([place] age 7  . COPD (chronic obstructive pulmonary disease) ([place]) 2016  . Depression age 37  . Enlarged prostate   . Hyperlipidemia 2016  . Hypertension 2013  . Schizoaffective disorder ([place]) 11-13-14  . Sleep apnea   . Thyroid disease 11-2014    Allergies:  Allergies  Allergen Reactions  . Desipramine Nausea Only and Rash    MEDICATIONS: . [START ON 11/15/2020] (feeding supplement) PROSource Plus  30 mL Oral BID BM  . [START ON 11/15/2020] feeding supplement  237 mL Oral BID BM  . fluconazole  400 mg Oral Daily  . furosemide  40 mg Intravenous BID  . indomethacin  100 mg Rectal Once  . multivitamin with minerals  1 tablet Oral Daily  . polyethylene glycol  17 g Oral Daily  . risperiDONE  1 mg Oral QHS  . senna-docusate  1 tablet Oral BID  . traZODone  150 mg Oral QHS    Social History   Tobacco Use  . Smoking status: Current Every Day Smoker    Packs/day: 0.25    Years: 26.00    Pack years: 6.50    Types: Cigarettes  . Smokeless tobacco: Never Used  . Tobacco comment: 4-5 cig daily as of 10/07/2020.  Vaping Use  . Vaping Use: Never used  Substance Use Topics  . Alcohol use: No  . Drug use: No    Family History  Problem Relation Age of Onset  . Diabetes Father   .  Heart disease Father   . Heart disease Maternal Grandmother   . Stroke Maternal Grandfather      Review of Systems  Constitutional: Negative for fever, chills, diaphoresis, activity change, appetite change, fatigue and unexpected weight change.  HENT: Negative for congestion, sore throat, rhinorrhea, sneezing, trouble swallowing and sinus pressure.  Eyes: Negative for photophobia and visual disturbance.  Respiratory: Negative for cough, chest tightness, shortness of breath, wheezing and stridor.  Cardiovascular: Negative  for chest pain, palpitations and leg swelling.  Gastrointestinal: +abdominal pain occasionally. Negative for nausea, vomiting, abdominal pain, diarrhea, constipation, blood in stool, abdominal distention and anal bleeding.  Genitourinary: Negative for dysuria, hematuria, flank pain and difficulty urinating.  Musculoskeletal: Negative for myalgias, back pain, joint swelling, arthralgias and gait problem.  Skin: Negative for color change, pallor, rash and wound.  Neurological: Negative for dizziness, tremors, weakness and light-headedness.  Hematological: Negative for adenopathy. Does not bruise/bleed easily.  Psychiatric/Behavioral: Negative for behavioral problems, confusion, sleep disturbance, dysphoric mood, decreased concentration and agitation.     OBJECTIVE: Temp:  [98.3 F (36.8 C)-99.9 F (37.7 C)] 98.5 F (36.9 C) (05/12 1559) Pulse Rate:  [89-105] 94 (05/12 1559) Resp:  [12-19] 19 (05/12 1559) BP: (114-139)/(56-92) 116/71 (05/12 1559) SpO2:  [90 %-98 %] 97 % (05/12 1559) Physical Exam  Constitutional: He is oriented to person, place, and time. He appears well-developed and well-nourished. No distress.  HENT:  Mouth/Throat: Oropharynx is clear and moist. No oropharyngeal exudate.  Cardiovascular: Normal rate, regular rhythm and normal heart sounds. Exam reveals no gallop and no friction rub.  No murmur heard.  Pulmonary/Chest: Effort normal and breath sounds normal. No respiratory distress. He has no wheezes.  Abdominal: Soft. Bowel sounds are normal. He exhibits no distension. There is no tenderness.  Lymphadenopathy:  He has no cervical adenopathy.  Neurological: He is alert and oriented to person, place, and time.  Skin: Skin is warm and dry. No rash noted. No erythema.  Psychiatric: He has a normal mood and affect. His behavior is normal.     LABS: Results for orders placed or performed during the hospital encounter of 11/01/20 (from the past 48 hour(s))  Glucose,  capillary     Status: Abnormal   Collection Time: 11/12/20  5:22 PM  Result Value Ref Range   Glucose-Capillary 112 (H) 70 - 99 mg/dL    Comment: Glucose reference range applies only to samples taken after fasting for at least 8 hours.  CBC     Status: Abnormal   Collection Time: 11/13/20  1:51 AM  Result Value Ref Range   WBC 14.7 (H) 4.0 - 10.5 K/uL   RBC 3.49 (L) 4.22 - 5.81 MIL/uL   Hemoglobin 10.5 (L) 13.0 - 17.0 g/dL   HCT 33.1 (L) 39.0 - 52.0 %   MCV 94.8 80.0 - 100.0 fL   MCH 30.1 26.0 - 34.0 pg   MCHC 31.7 30.0 - 36.0 g/dL   RDW 13.7 11.5 - 15.5 %   Platelets 437 (H) 150 - 400 K/uL   nRBC 0.2 0.0 - 0.2 %    Comment: Performed at [facility] [address], [place], [place] [address]  Comprehensive metabolic panel     Status: Abnormal   Collection Time: 11/13/20  1:51 AM  Result Value Ref Range   Sodium 133 (L) 135 - 145 mmol/L   Potassium 4.2 3.5 - 5.1 mmol/L   Chloride 93 (L) 98 - 111 mmol/L   CO2 33 (H) 22 -  32 mmol/L   Glucose, Bld 162 (H) 70 - 99 mg/dL    Comment: Glucose reference range applies only to samples taken after fasting for at least 8 hours.   BUN 11 6 - 20 mg/dL   Creatinine, Ser 0.91 0.61 - 1.24 mg/dL   Calcium 7.9 (L) 8.9 - 10.3 mg/dL   Total Protein 6.0 (L) 6.5 - 8.1 g/dL   Albumin 1.5 (L) 3.5 - 5.0 g/dL   AST 25 15 - 41 U/L   ALT 25 0 - 44 U/L   Alkaline Phosphatase 297 (H) 38 - 126 U/L   Total Bilirubin 1.0 0.3 - 1.2 mg/dL   GFR, Estimated >60 >60 mL/min    Comment: (NOTE) Calculated using the CKD-EPI Creatinine Equation (2021)    Anion gap 7 5 - 15    Comment: Performed at [facility], [place] [address] [place], [place] [address]  Glucose, capillary     Status: Abnormal   Collection Time: 11/13/20  8:46 AM  Result Value Ref Range   Glucose-Capillary 141 (H) 70 - 99 mg/dL    Comment: Glucose reference range applies only to samples taken after fasting for at least 8 hours.  Glucose, capillary     Status: Abnormal   Collection  Time: 11/13/20 11:57 AM  Result Value Ref Range   Glucose-Capillary 121 (H) 70 - 99 mg/dL    Comment: Glucose reference range applies only to samples taken after fasting for at least 8 hours.  Glucose, capillary     Status: Abnormal   Collection Time: 11/13/20  4:00 PM  Result Value Ref Range   Glucose-Capillary 121 (H) 70 - 99 mg/dL    Comment: Glucose reference range applies only to samples taken after fasting for at least 8 hours.  CBC     Status: Abnormal   Collection Time: 11/14/20  1:30 AM  Result Value Ref Range   WBC 15.5 (H) 4.0 - 10.5 K/uL   RBC 3.09 (L) 4.22 - 5.81 MIL/uL   Hemoglobin 9.3 (L) 13.0 - 17.0 g/dL   HCT 29.0 (L) 39.0 - 52.0 %   MCV 93.9 80.0 - 100.0 fL   MCH 30.1 26.0 - 34.0 pg   MCHC 32.1 30.0 - 36.0 g/dL   RDW 13.7 11.5 - 15.5 %   Platelets 395 150 - 400 K/uL   nRBC 0.1 0.0 - 0.2 %    Comment: Performed at [facility], [place] [address] [place], [place] [address]  Comprehensive metabolic panel     Status: Abnormal   Collection Time: 11/14/20  1:30 AM  Result Value Ref Range   Sodium 134 (L) 135 - 145 mmol/L   Potassium 4.1 3.5 - 5.1 mmol/L   Chloride 92 (L) 98 - 111 mmol/L   CO2 31 22 - 32 mmol/L   Glucose, Bld 124 (H) 70 - 99 mg/dL    Comment: Glucose reference range applies only to samples taken after fasting for at least 8 hours.   BUN 10 6 - 20 mg/dL   Creatinine, Ser 0.81 0.61 - 1.24 mg/dL   Calcium 8.3 (L) 8.9 - 10.3 mg/dL   Total Protein 6.3 (L) 6.5 - 8.1 g/dL   Albumin 2.1 (L) 3.5 - 5.0 g/dL   AST 19 15 - 41 U/L   ALT 21 0 - 44 U/L   Alkaline Phosphatase 234 (H) 38 - 126 U/L   Total Bilirubin 0.8 0.3 - 1.2 mg/dL   GFR, Estimated >60 >60  mL/min    Comment: (NOTE) Calculated using the CKD-EPI Creatinine Equation (2021)    Anion gap 11 5 - 15    Comment: Performed at [facility], [place] [address], [place], [place] [address]    MICRO: reveiwed 5/5 c.albicans IMAGING: CT ABDOMEN PELVIS W CONTRAST  Result Date:  11/13/2020 CLINICAL DATA:  Acute severe pancreatitis. EXAM: CT ABDOMEN AND PELVIS WITH CONTRAST TECHNIQUE: Multidetector CT imaging of the abdomen and pelvis was performed using the standard protocol following bolus administration of intravenous contrast. CONTRAST:  68mL OMNIPAQUE IOHEXOL 300 MG/ML  SOLN COMPARISON:  CT angio chest 11/12/2020, CT abdomen 11/05/2020 FINDINGS: Lower chest: Bilateral lower lobe linear atelectasis versus scarring. Bronchial wall thickening. Hepatobiliary: No focal liver abnormality. Status post cholecystectomy. Mild pneumobilia noted centrally on the left. Slightly improved mild intrahepatic biliary ductal dilatation on the left (5:45). Common bile duct stent in grossly appropriate position. Redemonstration of a gallbladder fossa surgical drain. Pancreas: Similar marked diffuse haziness of the pancreatic parenchyma, parenchymal edema, and peripancreatic fat stranding/free fluid. Again noted is poor opacification of the pancreatic parenchyma. Status post gastropancreatic pseudocyst stent with interval decrease in size of an approximately 5 cm (from 11 x 7 cm) proximal intraparenchymal pancreatic lesion now containing fluid and gas. This pseudocyst is again noted to be irregular in contour. Redemonstration of a very ill-defined poorly organized fluid collection within the transverse mesocolon (3:43) that measures approximately 9 cm in diameter. No definite new organized fluid collection. Spleen: Normal in size without focal abnormality. Adrenals/Urinary Tract: No adrenal nodule bilaterally. Bilateral kidneys enhance symmetrically. No hydronephrosis. No hydroureter. The urinary bladder is unremarkable. Stomach/Bowel: Stomach is within normal limits. No evidence of bowel wall thickening or dilatation. Stool throughout the colon. The appendix not definitely identified. Vascular/Lymphatic: No significant vascular findings are present. Multiple prominent mesenteric lymph nodes that are likely  reactive in etiology. No enlarged abdominal or pelvic lymph nodes. Reproductive: Prostate is unremarkable. Other: No intraperitoneal free gas. Musculoskeletal: Interval increase in subcutaneus soft tissue edema. No suspicious lytic or blastic osseous lesions. No acute displaced fracture. Degenerative changes at the L3-L4 level. IMPRESSION: 1. Persistent severe subacute necrotizing pancreatitis status post gastropancreatic pseudocyst stent placement with interval decrease in size of an approximately 5 cm (from 11 x 7 cm) intraparenchymal pancreatic lesion. 2. Interval slightly improved left intrahepatic biliary ductal dilatation. 3. Persistently very ill-defined and poorly organized approximately 9 cm in caliber fluid collection within the transverse mesocolon. 4. Status post cholecystectomy with gallbladder fossa surgical drain in similar position. 5. Stool throughout the colon. Electronically Signed   By: Iven Finn M.D.   On: 11/13/2020 06:36   Assessment/Plan:   Leukocytosis = anticipate to trend down since procedure today  Necrotizing pancreatitis = continue on meropenem for the next 48-72hrs since his procedure. Would change to amox/clav 875mg  BID and see if he tolerates and plan to  continue with fluconazole 400mg  po daily. If tolerates transition, then send home on 4 wk course to cover through his next procedure of stent removal.  Severe malnutrition from acute illness = continue to monitor oral intake to see if it exacerbates pancreatitis. And he is meeting nutritional goal.

## 2020-11-14 NOTE — Anesthesia Preprocedure Evaluation (Addendum)
Anesthesia Evaluation  Patient identified by MRN, date of birth, ID band Patient awake    Reviewed: Allergy & Precautions, NPO status , Patient's Chart, lab work & pertinent test results, reviewed documented beta blocker date and time   Airway Mallampati: II  TM Distance: >3 FB Neck ROM: Full    Dental  (+) Edentulous Lower, Edentulous Upper   Pulmonary sleep apnea and Continuous Positive Airway Pressure Ventilation , COPD,  COPD inhaler, Current Smoker and Patient abstained from smoking.,    Pulmonary exam normal breath sounds clear to auscultation       Cardiovascular hypertension, Pt. on medications Normal cardiovascular exam Rhythm:Regular Rate:Normal  EKG 11/05/20 Sinus tachycardia, otherwise normal  Echo 10/11/20 1. Poor acoustic windows limit study.  2. Cannot fully evaluate regional wall motion as endocardium is difficult to see.. Left ventricular ejection fraction, by estimation, is >75%. The left ventricle has hyperdynamic function. There is mild left ventricular hypertrophy. Left ventricular diastolic parameters were normal.  3. Right ventricular systolic function is normal. The right ventricular size is normal.  4. The mitral valve is normal in structure. Trivial mitral valve regurgitation.  5. The aortic valve is abnormal. Aortic valve regurgitation is not visualized. Mild aortic valve sclerosis is present, with no evidence of aortic valve stenosis.  6. The inferior vena cava is normal in size with greater than 50% respiratory variability, suggesting right atrial pressure of 3 mmHg.    Neuro/Psych PSYCHIATRIC DISORDERS Anxiety Depression Bipolar Disorder Schizophrenia Schizoaffective disorder Neuromuscular disease    GI/Hepatic Neg liver ROS, Necrotizing pancreatitis Bile leak S/P ERCP with stent Cholelithiasis with cholecystitis   Endo/Other  Obesity Hyperlipidemia  Renal/GU negative Renal ROS  negative  genitourinary   Musculoskeletal negative musculoskeletal ROS (+)   Abdominal (+) + obese,   Peds  Hematology  (+) anemia ,   Anesthesia Other Findings   Reproductive/Obstetrics                           Anesthesia Physical Anesthesia Plan  ASA: III  Anesthesia Plan: General   Post-op Pain Management:    Induction: Intravenous  PONV Risk Score and Plan: 2 and Treatment may vary due to age or medical condition, Propofol infusion, Midazolam and Ondansetron  Airway Management Planned: Oral ETT  Additional Equipment:   Intra-op Plan:   Post-operative Plan: Extubation in OR  Informed Consent: I have reviewed the patients History and Physical, chart, labs and discussed the procedure including the risks, benefits and alternatives for the proposed anesthesia with the patient or authorized representative who has indicated his/her understanding and acceptance.     Dental advisory given  Plan Discussed with: CRNA and Anesthesiologist  Anesthesia Plan Comments:        Anesthesia Quick Evaluation

## 2020-11-14 NOTE — Progress Notes (Signed)
Pt received from endoscopy. VSS. Lunch tray ordered. Will continue to monitor.  Clyde Canterbury, RN

## 2020-11-14 NOTE — Progress Notes (Signed)
Nutrition Follow Up  DOCUMENTATION CODES:    Severe malnutrition in context of acute illness/injury  INTERVENTION:   Start 24-48 hour calorie count. RN aware.    Ensure Enlive po BID, each supplement provides 350 kcal and 20 grams of protein  30 ml ProSource Plus BID, each supplement provides 100 kcals and 15 grams protein.   MVI daily   NUTRITION DIAGNOSIS:   Severe Malnutrition related to acute illness as evidenced by energy intake < or equal to 50% for > or equal to 5 days,percent weight loss,moderate fat depletion.  Ongoing  GOAL:   Patient will meet greater than or equal to 90% of their needs   Progressing   MONITOR:   Diet advancement,Labs,Weight trends,Skin,I & O's  REASON FOR ASSESSMENT:   Consult Assessment of nutrition requirement/status  ASSESSMENT:  50 yo male with a PMH of OSA, bipolar disorder, depression/anxiety, schizoaffective disorder, HLD, HTN, and unspecified thyroid disorder who presents from [facility] (admitted 4/29) with intra-abdominal infection/necrotizing pancreatitis and transferred to [facility] on 5/3 for advanced ERCP intervention.   4/6 - cholecystectomy complicated by postop bile leak due to torn cystic duct that was clipped - JP drain placed 4/7 - ERCP with sphincterotomy and placement of plastic stent - developed post ERCP pancreatitis 4/27 - Bile leak following messy cholecystectomy  5/05 - s/p upper EUS showed pancreatic pseudocyst s/p axials stenting, partial necrosectomy  5/07 - s/p ERCP/EGD, s/p repeat necrosectomy  5/12 - s/p repeat necrosectomy   Diet advanced to soft on 5/10. Meal completions since have been documented as 75-100%. Per tech patient has great appetite. Will add supplementation to maximize kcal and protein. Will start calorie count to ensure patient is meeting needs.   Admission weight: 120.3 kg Current weight: 119 kg (taken on 5/7)  UOP: 1100 ml x 24 hrs  JP drain: 55 ml x 24 hrs   Medications: 40 mg lasix BID,  miralax, senokot Labs: Na 134 (L) CBG 112-141  Diet Order            DIET SOFT Room service appropriate? Yes; Fluid consistency: Thin  Diet effective now                EDUCATION NEEDS:  Education needs have been addressed  Skin:  Skin Assessment: Skin Integrity Issues: Skin Integrity Issues:: Incisions,Other (Comment) Incisions: Abdomen, closed Other: Jaundice  Last BM:  5/12  Height:  Ht Readings from Last 1 Encounters:  11/07/20 5' 9.5" (1.765 m)   Weight:  Wt Readings from Last 1 Encounters:  11/09/20 119 kg   Ideal Body Weight:  74.1 kg  BMI:  Body mass index is 38.19 kg/m.  Estimated Nutritional Needs:   Kcal:  2200-2400 kcal   Protein:  110-125 grams   Fluid:  >2 L  Mariana Single RD, LDN Clinical Nutrition Pager listed in [place]

## 2020-11-14 NOTE — Op Note (Signed)
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 Sharin Grave T. Fuller Plan, MD, Hildred Laser, MD, [facility] Medicines:                General Anesthesia, Antibiotics not administered as                            they are scheduled for later today Complications:            No immediate complications. Estimated Blood Loss:     Estimated blood loss was minimal. Procedure:                Pre-Anesthesia Assessment:                           - Prior to the procedure, a History and Physical                            was performed, and patient medications and                            allergies were reviewed. The patient's tolerance of                            previous anesthesia was also reviewed. The risks                            and benefits of the procedure and the sedation                            options and risks were discussed with the patient.                            All questions were answered, and informed consent                            was obtained. Prior Anticoagulants: The patient has                            taken Lovenox (enoxaparin), last dose was 1 day  prior to procedure. ASA Grade Assessment: III - A                            patient with severe systemic disease. After                            reviewing the risks and benefits, the patient was                            deemed in satisfactory condition to undergo the                            procedure.                            After obtaining informed consent, the endoscope was                            passed under direct vision. Throughout the                            procedure, the patient's blood pressure, pulse, and                            oxygen saturations were monitored continuously. The                            [id number]-[id number] ([id number]) Olympus therapeutic                            gastroscope was introduced through the mouth, and                            advanced to the second part of duodenum. The                            GIF-[id number] ([id number]) Olympus gastroscope was                            introduced through the mouth, and advanced to the                            second part of duodenum. The [id number] ([id number])                            Olympus Linear EUS scope was introduced through the                            mouth, and advanced to the duodenum for ultrasound                            examination from the stomach and duodenum. The  upper EUS was accomplished without difficulty. The                            patient tolerated the procedure. Scope In: Scope Out: Findings:      ENDOSCOPIC FINDING: :      No gross lesions were noted in the proximal esophagus and in the mid       esophagus.      LA Grade B (one or more mucosal breaks greater than 5 mm, not extending       between the tops of two mucosal folds) esophagitis was found in the       distal esophagus.      Striped moderately erythematous mucosa without bleeding was found in the       gastric body.      No other gross lesions were noted in the entire examined stomach.      A previously placed AXIOS cystgastrostomy stent was found at the       incisura with 2 double pigtail stents. The two double pigtail stents       were removed from the AXIOS. The cystgastrostomy tract was entered and       was filled with fluid and black necrotic tissue that was pasty and       adherent to the cyst  wall. Necrosectomy was performed with a Raptor       grasping device and Roth net, requiring numerous intubations of the cyst       (total of 50 minutes). At the conclusion of the procedure, some necrotic       tissue and a medium amount of pink, viable tissue was found within the       pseudocyst on direct vision. One 10 French x 5 cm double pigtail stent       was inserted across the AXIOS cystgastrostomy tract.      A moderate extrinsic deformity was found in the duodenal bulb, in the       first portion of the duodenum and in the second portion of the duodenum       - stable to improving.      Two previously placed plastic biliary stents were seen at the major       papilla.      ENDOSONOGRAPHIC FINDING: :      Pancreatic parenchymal abnormalities were noted in the entire pancreas.       These consisted of diffuse echogenicity, hyperechoic strands,       hyperechoic foci and lobularity.      An AXIOS cystgastrostomy stent was seen in the in the pancreas region.      Two benign-appearing lymph nodes were visualized in the peripancreatic       region. The largest measured 8 mm by 7 mm in maximal cross-sectional       diameter. The nodes were triangular, homogenous and had well defined       margins.      The celiac region was visualized.      Unable to appreciate the Transverse Mesocolon fluid collection noted on       CT scan recently performed. Impression:               EGD Impression:                           - No gross lesions in  esophagus proximally. LA                            Grade B esophagitis distally.                           - Erythematous mucosa in the gastric body. No other                            gross lesions in the stomach.                           - Pre-existing AXIOS cystgastrostomy stent and                            double pigtail stents in place. Double pigtail                            stents removed.                           - Necrosectomy was  performed. Double pigtail                            replaced through AXIOS                           - Duodenal extrinsic deformity.                           - Plastic biliary stents at ampulla.                           EUS Impression:                           - Pancreatic parenchymal abnormalities consisting                            of diffuse echogenicity, hyperechoic strands,                            hyperechoic foci and lobularity were noted in the                            entire pancreas.                           - An AXIOS cystgastrostomy stent was seen.                           - Two benign lymph nodes were visualized in the                            peripancreatic region. Tissue has not been  obtained. However, the endosonographic appearance                            is suggestive of benign inflammatory changes. Recommendation:           - The patient will be observed post-procedure,                            until all discharge criteria are met.                           - Return patient to hospital ward for ongoing care.                           - Observe patient's clinical course.                           - Continue present medications.                           - Nutrition is key at this point in time. If                            calorie counts do not suggest adequate nutrition,                            then need to consider Cortrak placement.                           - CTAP in 2-3 weeks, as long as patient is doing                            well as an outpatient. If cyst continues to have                            improvement, will be scheduled for EGD with stent                            pull thereafter.                           - Would recommend at least 1 month of antibiotics.                            Would likely consider Meropenem while inpatient,                            but could consider transition to IM  carbapenem for                            discharge vs if doing well, PO Augmentin. If                            transitioning to PO Augmentin then would need to be  monitored for at least 1-2 days while inhouse to                            ensure stability on that type of medication. 1                            month course could begin when Meropenem was added.                            I want him to remain on some sort of antibiotics                            until his cystgastrostomy stents are removed.                           - The findings and recommendations were discussed                            with the patient.                           - The findings and recommendations were discussed                            with the referring physician. Procedure Code(s):        --- Professional ---                           [phone], Esophagogastroduodenoscopy, flexible,                            transoral; with endoscopic ultrasound examination                            limited to the esophagus, stomach or duodenum, and                            adjacent structures                           48999, Unlisted procedure, pancreas Diagnosis Code(s):        --- Professional ---                           K20.90, Esophagitis, unspecified without bleeding                           K31.89, Other diseases of stomach and duodenum                           Z97.8, Presence of other specified devices                           I89.9, Noninfective disorder of lymphatic vessels  and lymph nodes, unspecified                           K86.9, Disease of pancreas, unspecified                           K86.89, Other specified diseases of pancreas                           R93.3, Abnormal findings on diagnostic imaging of                            other parts of digestive tract CPT copyright 2019 [employer]. All rights reserved. The  codes documented in this report are preliminary and upon coder review may  be revised to meet current compliance requirements. Justice Britain, MD 11/14/2020 1:08:53 PM Number of Addenda: 0

## 2020-11-14 NOTE — Progress Notes (Signed)
Mobility Specialist: Progress Note   11/14/20 1528  Mobility  Activity Ambulated in hall  Level of Assistance Modified independent, requires aide device or extra time  Assistive Device Front wheel walker  Distance Ambulated (ft) 470 ft  Mobility Ambulated with assistance in hallway  Mobility Response Tolerated well  Mobility performed by Mobility specialist  $Mobility charge 1 Mobility   Pre-Mobility: 102 HR, 90% SpO2 During Mobility: 109 HR, 90% SpO2 Post-Mobility: 105 HR, 94% SpO2  Pt to BR and agreeable to ambulate after. Pt asx during ambulation. Pt back to bed per request to take a nap.   [facility] Shawntavia Saunders Mobility Specialist Mobility Specialist Phone: [phone]

## 2020-11-14 NOTE — Interval H&P Note (Signed)
History and Physical Interval Note:  11/14/2020 10:55 AM  Steve Romero  has presented today for surgery, with the diagnosis of Pancreatic necrosis.  The various methods of treatment have been discussed with the patient and family. After consideration of risks, benefits and other options for treatment, the patient has consented to  Procedure(s): ESOPHAGOGASTRODUODENOSCOPY (EGD) with Necrosectomy (N/A) as a surgical intervention.  The patient's history has been reviewed, patient examined, no change in status, stable for surgery.  I have reviewed the patient's chart and labs.  Questions were answered to the patient's satisfaction.    Possible Endoscopic Ultrasound to see if I can evaluate the transverse mesocolon fluid collection that may require drainage.  Repeat necrosectomy, though patient is making tremendous strides moving forward at this time from just 1 week prior when this process began.  The risks of an EUS including intestinal perforation, bleeding, infection, aspiration, and medication effects were discussed as was the possibility it may not give a definitive diagnosis if a biopsy is performed.  When a biopsy of the pancreas is done as part of the EUS, there is an additional risk of pancreatitis at the rate of about 1-2%.  It was explained that procedure related pancreatitis is typically mild, although it can be severe and even life threatening, which is why we do not perform random pancreatic biopsies and only biopsy a lesion/area we feel is concerning enough to warrant the risk.  The risks and benefits of endoscopic evaluation were discussed with the patient; these include but are not limited to the risk of perforation, infection, bleeding, missed lesions, lack of diagnosis, severe illness requiring hospitalization, as well as anesthesia and sedation related illnesses.  The patient is agreeable to proceed.    [employer]

## 2020-11-14 NOTE — Anesthesia Postprocedure Evaluation (Signed)
Anesthesia Post Note  Patient: Steve Romero  Procedure(s) Performed: ESOPHAGOGASTRODUODENOSCOPY (EGD) with Necrosectomy (N/A ) UPPER ENDOSCOPIC ULTRASOUND (EUS) LINEAR     Patient location during evaluation: PACU Anesthesia Type: General Level of consciousness: awake and alert and oriented Pain management: pain level controlled Vital Signs Assessment: post-procedure vital signs reviewed and stable Respiratory status: spontaneous breathing, nonlabored ventilation and respiratory function stable Cardiovascular status: blood pressure returned to baseline and stable Postop Assessment: no apparent nausea or vomiting Anesthetic complications: no   No complications documented.  Last Vitals:  Vitals:   11/14/20 1301 11/14/20 1311  BP: (!) 121/58 (!) 122/56  Pulse: 100 93  Resp: 19 16  Temp:    SpO2: 93% 96%    Last Pain:  Vitals:   11/14/20 1311  TempSrc:   PainSc: 0-No pain                 Gardiner Espana A.

## 2020-11-15 DIAGNOSIS — K839 Disease of biliary tract, unspecified: Secondary | ICD-10-CM | POA: Diagnosis not present

## 2020-11-15 DIAGNOSIS — K863 Pseudocyst of pancreas: Secondary | ICD-10-CM | POA: Diagnosis not present

## 2020-11-15 DIAGNOSIS — K8689 Other specified diseases of pancreas: Secondary | ICD-10-CM | POA: Diagnosis not present

## 2020-11-15 DIAGNOSIS — E43 Unspecified severe protein-calorie malnutrition: Secondary | ICD-10-CM | POA: Diagnosis not present

## 2020-11-15 LAB — RENAL FUNCTION PANEL
Albumin: 2.4 g/dL — ABNORMAL LOW (ref 3.5–5.0)
Anion gap: 10 (ref 5–15)
BUN: 13 mg/dL (ref 6–20)
CO2: 34 mmol/L — ABNORMAL HIGH (ref 22–32)
Calcium: 8.3 mg/dL — ABNORMAL LOW (ref 8.9–10.3)
Chloride: 91 mmol/L — ABNORMAL LOW (ref 98–111)
Creatinine, Ser: 0.93 mg/dL (ref 0.61–1.24)
GFR, Estimated: >60 mL/min (ref >60)
Glucose, Bld: 112 mg/dL — ABNORMAL HIGH (ref 70–99)
Phosphorus: 4.7 mg/dL — ABNORMAL HIGH (ref 2.5–4.6)
Potassium: 4.5 mmol/L (ref 3.5–5.1)
Sodium: 135 mmol/L (ref 135–145)

## 2020-11-15 LAB — CBC
HCT: 29.8 % — ABNORMAL LOW (ref 39.0–52.0)
Hemoglobin: 9.3 g/dL — ABNORMAL LOW (ref 13.0–17.0)
MCH: 29.4 pg (ref 26.0–34.0)
MCHC: 31.2 g/dL (ref 30.0–36.0)
MCV: 94.3 fL (ref 80.0–100.0)
Platelets: 407 10*3/uL — ABNORMAL HIGH (ref 150–400)
RBC: 3.16 MIL/uL — ABNORMAL LOW (ref 4.22–5.81)
RDW: 13.4 % (ref 11.5–15.5)
WBC: 17.2 10*3/uL — ABNORMAL HIGH (ref 4.0–10.5)
nRBC: 0 % (ref 0.0–0.2)

## 2020-11-15 MED ORDER — ENSURE ENLIVE PO LIQD
237.0000 mL | Freq: Three times a day (TID) | ORAL | Status: DC
  Administered 2020-11-15 – 2020-11-24 (×21): 237 mL via ORAL

## 2020-11-15 MED ORDER — AMOXICILLIN-POT CLAVULANATE 875-125 MG PO TABS: 1.0000 | ORAL_TABLET | Freq: Two times a day (BID) | ORAL | Status: DC

## 2020-11-15 MED ORDER — HEPARIN SODIUM (PORCINE) 5000 UNIT/ML IJ SOLN
5000.0000 [IU] | Freq: Three times a day (TID) | INTRAMUSCULAR | Status: DC
  Administered 2020-11-15 – 2020-11-24 (×27): 5000 [IU] via SUBCUTANEOUS
  Filled 2020-11-15 (×26): qty 1

## 2020-11-15 MED ORDER — SODIUM CHLORIDE 0.9 % IV SOLN
1.0000 g | Freq: Three times a day (TID) | INTRAVENOUS | Status: DC
  Administered 2020-11-15 – 2020-11-19 (×12): 1 g via INTRAVENOUS
  Filled 2020-11-15 (×14): qty 1

## 2020-11-15 NOTE — Progress Notes (Signed)
Mobility Specialist: Progress Note   11/15/20 1110  Mobility  Activity Ambulated in hall  Level of Assistance Standby assist, set-up cues, supervision of patient - no hands on  Assistive Device Front wheel walker  Distance Ambulated (ft) 940 ft  Mobility Ambulated independently in hallway  Mobility Response Tolerated well  Mobility performed by Mobility specialist  $Mobility charge 1 Mobility   During Mobility: 121 HR  Pt visually tired/lethargic but had no c/o during ambulation, RN notified. Pt otherwise asx, back to bed after walk.   [facility] Jeri Rawlins Mobility Specialist Mobility Specialist Phone: [phone]

## 2020-11-15 NOTE — Progress Notes (Addendum)
Daily Rounding Note  11/15/2020, 11:10 AM  LOS: 14 days   SUBJECTIVE:   Chief complaint:    Necrotic pancreatic cyst.  Status post necrosectomy.  Still having some right upper quadrant pain but overall much improved.  Declining need and doses of oxycodone for management. Eating very well without nausea or vomiting.  Walking in the hallways without problems.  Overall feels much improved from when he was admitted.  Asked if he felt ready to go home and he said yes, it was a hypothetical question as needs to be observed for a bit longer.  OBJECTIVE:         Vital signs in last 24 hours:    Temp:  [98.3 F (36.8 C)-100.6 F (38.1 C)] 99.7 F (37.6 C) (05/13 0806) Pulse Rate:  [76-105] 94 (05/13 0806) Resp:  [16-19] 18 (05/13 0806) BP: (114-141)/(56-92) 118/74 (05/13 0806) SpO2:  [90 %-97 %] 92 % (05/13 0806) Last BM Date: 11/14/20 Filed Weights   11/06/20 0109 11/08/20 0340 11/09/20 0353  Weight: 120.3 kg 118.9 kg 119 kg   General: Obese.  Comfortable.  Looks mildly ill but no distress. Heart: RRR. Chest: Clear bilaterally.  No labored breathing or cough. Abdomen: BS, soft.  Mild right upper quadrant tenderness without guarding or rebound. Extremities: No CCE. Neuro/Psych: Oriented x3.  Moves all 4 limbs.  No tremor or weakness.  Intake/Output from previous day: 05/12 0701 - 05/13 0700 In: 1597.1 [P.O.:960; IV Piggyback:607.1] Out: 1895 [Urine:1700; Drains:195]  Intake/Output this shift: Total I/O In: 236 [P.O.:236] Out: 40 [Drains:40]  Lab Results: Recent Labs    11/13/20 0151 11/14/20 0130  WBC 14.7* 15.5*  HGB 10.5* 9.3*  HCT 33.1* 29.0*  PLT 437* 395   BMET Recent Labs    11/13/20 0151 11/14/20 0130 11/15/20 0941  NA 133* 134* 135  K 4.2 4.1 4.5  CL 93* 92* 91*  CO2 33* 31 34*  GLUCOSE 162* 124* 112*  BUN 11 10 13   CREATININE 0.91 0.81 0.93  CALCIUM 7.9* 8.3* 8.3*   LFT Recent Labs     11/13/20 0151 11/14/20 0130 11/15/20 0941  PROT 6.0* 6.3*  --   ALBUMIN 1.5* 2.1* 2.4*  AST 25 19  --   ALT 25 21  --   ALKPHOS 297* 234*  --   BILITOT 1.0 0.8  --    Studies/Results: No results found.   Scheduled Meds: . (feeding supplement) PROSource Plus  30 mL Oral BID BM  . feeding supplement  237 mL Oral BID BM  . fluconazole  400 mg Oral Daily  . furosemide  40 mg Intravenous BID  . heparin injection (subcutaneous)  5,000 Units Subcutaneous Q8H  . indomethacin  100 mg Rectal Once  . multivitamin with minerals  1 tablet Oral Daily  . polyethylene glycol  17 g Oral Daily  . risperiDONE  1 mg Oral QHS  . senna-docusate  1 tablet Oral BID  . traZODone  150 mg Oral QHS   Continuous Infusions: . meropenem (MERREM) IV Stopped (11/15/20 0519)   PRN Meds:.acetaminophen **OR** acetaminophen, bisacodyl, diphenhydrAMINE **OR** diphenhydrAMINE, ipratropium-albuterol, morphine injection, ondansetron **OR** ondansetron (ZOFRAN) IV, oxyCODONE   ASSESMENT:   *  Hx post ERCP pancreatitis with pseudocyst/necrosis. S/p ERCP, cyst gastrostomy, stent placement, necrosectomy x3.  Original biliary stent removed/replaced and a second stent was placed as of 5/9, double-pigtail stents replaced as of latest ERCP 5/12.  Clinically much improved. However WBCs trending  up 15.5 >> 17.2 in last 24 hours. Minor fever to 100.6 at 1720 yesterday but afebrile otherwise. Day 15 Meropenem.     *    Post cholecystectomy bile leak leading to ERCP and biliary stent placement  *    Severe malnutrition.  Calorie counts in place.  Appetite has improved dramatically.  Generally consuming 100% of trays with additional Ensure and multivitamins in place.    *    Stable, normocytic anemia.  *    Moderate esophagitis on 11/11/2020 EGD.   PLAN   *   Nutrition is key.  If calorie counts inadequate: consider Cortrak placement.   *   Plan outpt CTAP in 2-3 weeks.  If cyst improved will be scheduled for EGD w  stent pull.     *   Complete at least 1 month total of antibiotics.  Switch to po Augmentin if/when WBCs trend down and no fevers.  Continue meropenem for now.  Possibility he may require IM Carbapenem following discharge.  *   Watch cal counts, RD on board.    *   If stable anticipate discharge on 5/15 or 5/16.    *   Dr. Rush Landmark and his staff will arrange and notify patient of all all outpatient follow-ups.          Steve Romero  11/15/2020, 11:10 AM Phone [phone]     Attending Physician Note   I have taken an interval history, reviewed the chart and examined the patient. I agree with the Advanced Practitioner's note, impression and recommendations.   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 RUQ pain much improved. Appetite improved. Continue IV meropenem and PO fluconazole. Consider change to Augmentin bid when WBC improves and he is consistently afebrile. Observe closely over the weekend following necrosectomy on Thursday.    Post cholecystectomy bile leak. S/P biliary stent placement. Minimal serous, bilious output in drain.  Malnutrition. Tolerating soft diet without difficulty. Continue to monitor oral intake, calorie intake.    Drs. Collene Mares and Benson Norway are covering for the weekend and will see tomorrow.   Steve Edward, MD FACG [phone]

## 2020-11-15 NOTE — Progress Notes (Signed)
PROGRESS NOTE    Steve Romero   HAL:[id number]  DOB: 06/16/1971  PCP: [name], DO    DOA: 11/01/2020 LOS: 5   Brief Narrative   Steve Romero is a 50 y.o. male with medical history significant for COPD, bipolar disorder, schizoaffective disorder and depression presented to the ED with persistent right-sided abdominal pain, N/V, poor appetite and early satiety.  Also noted increased JP drain output.   Initially admitted at [facility] on 4/29, transferred to [facility] for GI intervention.  Recent History: 10/09/20 cholecystectomy complicated by postop bile leak due to torn cystic duct that was clipped.  JP drain placed. 10/10/2020 ERCP with sphincterotomy and placement of plastic stent.  Developed post ERCP pancreatitis. Plan was for repeat ERCP and likely stent removal in 6 weeks.   Discharged after 4/6 - 4/13 admission.  Readmitted 4/29 from surgeons's office.   Pt has developed large, complex fluid collection at the pancreatic head, uncinate, proximal body.  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  The necrotic collection is causing mass-effect on the gastric antrum/pylorus/proximal duodenum with reactive small bowel thickening.  Stent in stable position.  Intrapelvic bile duct dilation is mild to moderate likely due to mass-effect from the fluid collection.  There is fluid edema surrounding the pancreas extending into the retroperitoneum and mesentery.  Reactive thickening in the colon.   Significant Events:  5/5 ERCP / EUS - see Op Note findings.    5/6 LFT's improved, jaundice resolved 5/7 ERCP - see op note. Prior stent accessible now and new one placed. Partial necrosectomy 5/9 ERCP - necrosectomy and replacement of stent 5/12 ERCP with necrosectomy  Assessment & Plan   Principal Problem:   Disorder of bile duct stent Active Problems:   Pancreatic necrosis   Elevated LFTs   Ascending cholangitis   Protein-calorie  malnutrition, severe   Bile leak   Post-ERCP Pancreatitis with necrotic fluid collection 4/7 Bile leak status post cholecystectomy 4/6 -  JP drain in place and will be followed up by Dr. Constance Haw after discharge. Initially admitted at [facility]. General surgery there Dr. Constance Haw d/w Dr. Rush Landmark who advised transfer to [facility] for possible advanced ERCP.  --GI following, see their recs --For repeat ERCP, further necrosectomy on 5/12 --Diet per GI --Continue empiric Merrem --Follow CBC --RD consult for supplement recs --Pain control, antiemetics PRN --cultures from pancreatic cyst fluid shows moderate candida albicans --continue on diflucan --appreciate ID assistance -will receive meropenem for another 48-72hrs and then transition to augmentin -plan is to continue on augmentin and diflucan for another 4 weeks until he can follow up with GI to have repeat ERCP and possible stent removal -will likely have repeat CT in the next few weeks  Leukocytosis - wbc up over past couple of days. In setting of two procedures, suspect reactive.  Pt afebrile and has no other s/sx's of infection at this time.  Monitor CBC and clinically. Clinically he does not appear to be septic or toxic  Bilateral lower extremity edema - progressive over past several days.  Good response to trial of IV Lasix 5/8. --Continue IV Lasix 40 mg to BID  --Monitor renal function and electrolytes --Duplex U/S LE's negative for DVT --D-dimer elevated, but suspect that is related to pancreatitis --Echo last month poor study, EF >75%, no mention of diastolic findings - Suspect hypoalbuminemia from poor po intake contributing to anasarca --Started on albumin infusions with lasix - urine output has been fair --Likely  needs oral diuretic at d/c at least PRN swelling.  COPD - stable, without no exacerbation sx's.   --Duonebs PRN  Schizoaffective disorder, Depression - stable. --resume Risperdal, trazodone  Obesity:  Body mass index is 38.19 kg/m.   Complicates overall care and prognosis.  Recommend lifestyle modifications including physical activity and diet for weight loss and overall long-term health.   DVT prophylaxis: resume heparin, hold for any procedures   Diet:  Diet Orders (From admission, onward)    Start     Ordered   11/14/20 1333  DIET SOFT Room service appropriate? Yes; Fluid consistency: Thin  Diet effective now       Question Answer Comment  Room service appropriate? Yes   Fluid consistency: Thin      11/14/20 1332            Code Status: Full Code    Subjective 11/15/20    Tolerating soft foods, eating 100% of meals. Feels that lower extremity edema is slowly improving. abd pain is controlled  Disposition Plan & Communication   Status is: Inpatient  Inpatient status appropriate due to severity of illness and ongoing diagnostic evaluation not appropriate for outpatient setting. Requires close monitoring and IV antibiotics.  May need further procedure/s.  Dispo: The patient is from: home              Anticipated d/c is to: home              Patient currently not medically stable for d/c   Difficult to place patient no   Consults, Procedures, Significant Events   Consultants:   Gastroenterology, Dr. Rush Landmark  Infectious disease  Procedures:   5/5 - ERCP  5/7 - ERCP  5/9 - ERCP with necrosectomy  5/12 ERCP with necrosectomy  Antimicrobials:  Anti-infectives (From admission, onward)   Start     Dose/Rate Route Frequency Ordered Stop   11/11/20 1315  fluconazole (DIFLUCAN) tablet 200 mg  Status:  Discontinued        200 mg Oral Daily 11/11/20 1221 11/11/20 1226   11/11/20 1315  fluconazole (DIFLUCAN) tablet 400 mg        400 mg Oral Daily 11/11/20 1226     11/01/20 2200  piperacillin-tazobactam (ZOSYN) IVPB 3.375 g  Status:  Discontinued        3.375 g 12.5 mL/hr over 240 Minutes Intravenous Every 8 hours 11/01/20 1335 11/01/20 1406   11/01/20 1500   meropenem (MERREM) 1 g in sodium chloride 0.9 % 100 mL IVPB        1 g 200 mL/hr over 30 Minutes Intravenous Every 8 hours 11/01/20 1413     11/01/20 1430  piperacillin-tazobactam (ZOSYN) IVPB 3.375 g  Status:  Discontinued        3.375 g 100 mL/hr over 30 Minutes Intravenous  Once 11/01/20 1353 11/01/20 1406        Micro    Objective   Vitals:   11/14/20 2130 11/15/20 0026 11/15/20 0435 11/15/20 0806  BP:  116/66 (!) 141/71 118/74  Pulse:  (!) 105 100 94  Resp:  18 17 18   Temp: 98.5 F (36.9 C) 99 F (37.2 C) 99.4 F (37.4 C) 99.7 F (37.6 C)  TempSrc: Oral Oral Oral Oral  SpO2:  91% 93% 92%  Weight:      Height:        Intake/Output Summary (Last 24 hours) at 11/15/2020 1105 Last data filed at 11/15/2020 0846 Gross per 24 hour  Intake  1833.09 ml  Output 1870 ml  Net -36.91 ml   Filed Weights   11/06/20 0109 11/08/20 0340 11/09/20 0353  Weight: 120.3 kg 118.9 kg 119 kg    Physical Exam:  General exam: Alert, awake, oriented x 3 Respiratory system: Clear to auscultation. Respiratory effort normal. Cardiovascular system:RRR. No murmurs, rubs, gallops. Gastrointestinal system: Abdomen is nondistended, soft and nontender. No organomegaly or masses felt. Normal bowel sounds heard. Central nervous system: Alert and oriented. No focal neurological deficits. Extremities: 2+ edema bilaterally Skin: No rashes, lesions or ulcers Psychiatry: Judgement and insight appear normal. Mood & affect appropriate.     Labs   Data Reviewed: I have personally reviewed following labs and imaging studies  CBC: Recent Labs  Lab 11/10/20 0039 11/11/20 0313 11/12/20 0401 11/13/20 0151 11/14/20 0130  WBC 18.4* 16.1* 15.3* 14.7* 15.5*  HGB 10.6* 10.3* 9.6* 10.5* 9.3*  HCT 34.1* 33.1* 30.3* 33.1* 29.0*  MCV 95.5 96.2 95.3 94.8 93.9  PLT 426* 442* 421* 437* 154   Basic Metabolic Panel: Recent Labs  Lab 11/11/20 0313 11/12/20 0401 11/13/20 0151 11/14/20 0130 11/15/20 0941   NA 135 135 133* 134* 135  K 4.0 4.4 4.2 4.1 4.5  CL 95* 92* 93* 92* 91*  CO2 33* 36* 33* 31 34*  GLUCOSE 152* 114* 162* 124* 112*  BUN 10 10 11 10 13   CREATININE 0.83 0.89 0.91 0.81 0.93  CALCIUM 7.9* 7.9* 7.9* 8.3* 8.3*  PHOS  --   --   --   --  4.7*   GFR: Estimated Creatinine Clearance: 123.3 mL/min (by C-G formula based on SCr of 0.93 mg/dL). Liver Function Tests: Recent Labs  Lab 11/10/20 0039 11/11/20 0313 11/12/20 0401 11/13/20 0151 11/14/20 0130 11/15/20 0941  AST 23 27 26 25 19   --   ALT 33 29 26 25 21   --   ALKPHOS 429* 381* 295* 297* 234*  --   BILITOT 1.2 1.0 1.1 1.0 0.8  --   PROT 5.4* 5.6* 5.4* 6.0* 6.3*  --   ALBUMIN 1.5* 1.4* 1.4* 1.5* 2.1* 2.4*   No results for input(s): LIPASE, AMYLASE in the last 168 hours. No results for input(s): AMMONIA in the last 168 hours. Coagulation Profile: No results for input(s): INR, PROTIME in the last 168 hours. Cardiac Enzymes: No results for input(s): CKTOTAL, CKMB, CKMBINDEX, TROPONINI in the last 168 hours. BNP (last 3 results) No results for input(s): PROBNP in the last 8760 hours. HbA1C: No results for input(s): HGBA1C in the last 72 hours. CBG: Recent Labs  Lab 11/12/20 0754 11/12/20 1722 11/13/20 0846 11/13/20 1157 11/13/20 1600  GLUCAP 144* 112* 141* 121* 121*   Lipid Profile: No results for input(s): CHOL, HDL, LDLCALC, TRIG, CHOLHDL, LDLDIRECT in the last 72 hours. Thyroid Function Tests: No results for input(s): TSH, T4TOTAL, FREET4, T3FREE, THYROIDAB in the last 72 hours. Anemia Panel: No results for input(s): VITAMINB12, FOLATE, FERRITIN, TIBC, IRON, RETICCTPCT in the last 72 hours. Sepsis Labs: No results for input(s): PROCALCITON, LATICACIDVEN in the last 168 hours.  Recent Results (from the past 240 hour(s))  Aerobic/Anaerobic Culture w Gram Stain (surgical/deep wound)     Status: None   Collection Time: 11/07/20  3:03 PM   Specimen: PATH Cytology FNA; Body Fluid  Result Value Ref Range  Status   Specimen Description FLUID PANCREATIC CYST DRAINAGE  Final   Special Requests A  Final   Gram Stain NO WBC SEEN RARE YEAST   Final   Culture  Final    MODERATE CANDIDA ALBICANS NO ANAEROBES ISOLATED Performed at [facility], [address], [place], [place] [address]    Report Status 11/12/2020 FINAL  Final      Imaging Studies   No results found.   Medications   Scheduled Meds: . (feeding supplement) PROSource Plus  30 mL Oral BID BM  . feeding supplement  237 mL Oral BID BM  . fluconazole  400 mg Oral Daily  . furosemide  40 mg Intravenous BID  . indomethacin  100 mg Rectal Once  . multivitamin with minerals  1 tablet Oral Daily  . polyethylene glycol  17 g Oral Daily  . risperiDONE  1 mg Oral QHS  . senna-docusate  1 tablet Oral BID  . traZODone  150 mg Oral QHS   Continuous Infusions: . meropenem (MERREM) IV Stopped (11/15/20 0519)       LOS: 14 days    Time spent: 30 minutes with >50% spent at bedside and in coordination of care.     Erick Blinks, MD [facility]  11/15/2020, 11:05 AM      If 7PM-7AM, please contact night-coverage. How to contact the [facility] Attending or Consulting provider 7A - 7P or covering provider during after hours 7P -7A, for this patient?    1. Check the care team in [facility] and look for a) attending/consulting [facility] provider listed and b) the [facility] team listed 2. Log into [email or web address] and use [place]'s universal password to access. If you do not have the password, please contact the hospital operator. 3. Locate the [facility] provider you are looking for under [facility] and page to a number that you can be directly reached. 4. If you still have difficulty reaching the provider, please page the [facility] (Director on Call) for the Hospitalists listed on amion for assistance.

## 2020-11-15 NOTE — Progress Notes (Signed)
Pharmacy Antibiotic Note  Steve Romero is a 50 y.o. male admitted at [facility] on 11/01/2020 with intra-abdominal infection/necrotizing pancreatitis s/p bile leak status post cholecystectomy 4/6. Pharmacy was consulted for empiric meropenem dosing starting on 11/01/20. Patient was transferred on 11/05/20 to [facility] for advanced ERCP intervention.   5/7:  s/p EGD with necrosectomy and ERCP. 5/9: s/p EGD with necrosectomy  with successful repositioning of previously placed biliary stent into good position and additional biliary stent place into CBD.  5/12:  s/p EGD with necrosectomy    Afebrile, Tmax 100.6,  WBC trended down to 14.7, now with slight increase to 15.5  today.  SCr stable <1 5/5 pancreatic cyst fluid cx: no WBC, rare yeast - > Candida albicans .  I discussed with ID [name] on 5/9, recommended start fluconazole,which Dr. Arbutus Ped agree to start, fluconazole 400 mg po once daily.  ID consulted and recommends to continue IV Merrem for the next 48-72hrs since his procedure then change to oral amox/calv 875 mg BID and continue fluconazole 400mg  po daily.  If tolerates, recommends to send home om 4 week course.     Plan: Continue Meropenem 1g IV every 8 hours Fluconazole 400mg  po daily  Monitor C&S, renal function, GI plans  Height: 5' 9.5" (176.5 cm) Weight: 119 kg (262 lb 5.4 oz) IBW/kg (Calculated) : 71.85  Temp (24hrs), Avg:99.1 F (37.3 C), Min:98.3 F (36.8 C), Max:100.6 F (38.1 C)  Recent Labs  Lab 11/10/20 0039 11/11/20 0313 11/12/20 0401 11/13/20 0151 11/14/20 0130  WBC 18.4* 16.1* 15.3* 14.7* 15.5*  CREATININE 0.90 0.83 0.89 0.91 0.81    Estimated Creatinine Clearance: 141.5 mL/min (by C-G formula based on SCr of 0.81 mg/dL).    Allergies  Allergen Reactions  . Desipramine Nausea Only and Rash    Antimicrobials this admission: Merrem 4/29 >>  Fluconazole 5/10>>  Microbiology results:   5/5 pancreatic cyst fluid cx: no WBC, rare yeast -  > Candida albicans -notes as pending but ID said lab does not run sens on this   Thank you for allowing pharmacy to be a part of this patient's care.   Nicole Cella, RPh Clinical Parmacist [phone] Please check AMION for all [place] phone numbers After 10:00 PM, call [place] [phone] 11/15/2020 8:37 AM

## 2020-11-15 NOTE — Progress Notes (Signed)
ID PROGRESS NOTE  64WO M with complicated necrotizing pancreatitis and pseudocyst s/p necrosectomy with cyst gastrostomy/stent placement/replacement on 5/12.   Wbc slightly elevated again today after procedure with fever of 100.6  A/P: Continue on meropenem and fluconazole throughout the weekend As WBC trends down, will see if can tolerate to change to amox/clav plus fluconazole  I will see back on Monday. Dr Lucianne Lei dam available for questions.

## 2020-11-15 NOTE — Progress Notes (Addendum)
Calorie Count Note  Calorie count ordered.  Diet: SOFT diet Supplements: Ensure Enlive BID, ProSource Plus BID  Day 1 Results  Lunch: 760 kcal, 39 grams protein Dinner: 290 kcal, 15 grams protein Breakfast: 714 kcal, 34 grams protein Supplements: 1400 kcal, 80 grams protein  Total intake: 3164 kcal (100% of minimum estimated needs)  168 protein (100% of minimum estimated needs)  Estimated Nutritional Needs:  Kcal:  2200-2400 kcal  Protein:  110-125 grams  Fluid:  >2 L  Nutrition Dx: Severe Malnutrition related to acute illness as evidenced by energy intake < or equal to 50% for > or equal to 5 days,percent weight loss,moderate fat depletion- Improving   Goal: Patient will meet greater than or equal to 90% of their needs- Meeting  Intervention:    D/C calorie count- meeting needs  D/C Prosource  Continue Ensure Enlive po TID, each supplement provides 350 kcal and 20 grams of protein  Steve Romero RD, LDN Clinical Nutrition Pager listed in [place]

## 2020-11-16 LAB — RENAL FUNCTION PANEL
Albumin: 2.2 g/dL — ABNORMAL LOW (ref 3.5–5.0)
Anion gap: 12 (ref 5–15)
BUN: 12 mg/dL (ref 6–20)
CO2: 29 mmol/L (ref 22–32)
Calcium: 8.4 mg/dL — ABNORMAL LOW (ref 8.9–10.3)
Chloride: 93 mmol/L — ABNORMAL LOW (ref 98–111)
Creatinine, Ser: 0.77 mg/dL (ref 0.61–1.24)
GFR, Estimated: >60 mL/min (ref >60)
Glucose, Bld: 108 mg/dL — ABNORMAL HIGH (ref 70–99)
Phosphorus: 4.1 mg/dL (ref 2.5–4.6)
Potassium: 4 mmol/L (ref 3.5–5.1)
Sodium: 134 mmol/L — ABNORMAL LOW (ref 135–145)

## 2020-11-16 LAB — CBC
HCT: 29.8 % — ABNORMAL LOW (ref 39.0–52.0)
Hemoglobin: 9.3 g/dL — ABNORMAL LOW (ref 13.0–17.0)
MCH: 29.5 pg (ref 26.0–34.0)
MCHC: 31.2 g/dL (ref 30.0–36.0)
MCV: 94.6 fL (ref 80.0–100.0)
Platelets: 388 10*3/uL (ref 150–400)
RBC: 3.15 MIL/uL — ABNORMAL LOW (ref 4.22–5.81)
RDW: 13.8 % (ref 11.5–15.5)
WBC: 17.5 10*3/uL — ABNORMAL HIGH (ref 4.0–10.5)
nRBC: 0 % (ref 0.0–0.2)

## 2020-11-16 MED ORDER — GERHARDT'S BUTT CREAM
TOPICAL_CREAM | CUTANEOUS | Status: DC | PRN
  Filled 2020-11-16: qty 1

## 2020-11-16 MED ORDER — ALBUMIN HUMAN 25 % IV SOLN
25.0000 g | Freq: Two times a day (BID) | INTRAVENOUS | Status: AC
  Administered 2020-11-16 (×2): 25 g via INTRAVENOUS
  Filled 2020-11-16 (×2): qty 100

## 2020-11-16 NOTE — Progress Notes (Signed)
PROGRESS NOTE    Steve Romero   TKW:[id number]  DOB: 1971/05/07  PCP: [name], DO    DOA: 11/01/2020 LOS: 67   Brief Narrative   Steve Romero is a 50 y.o. male with medical history significant for COPD, bipolar disorder, schizoaffective disorder and depression presented to the ED with persistent right-sided abdominal pain, N/V, poor appetite and early satiety.  Also noted increased JP drain output.   Initially admitted at [facility] on 4/29, transferred to [facility] for GI intervention.  Recent History: 10/09/20 cholecystectomy complicated by postop bile leak due to torn cystic duct that was clipped.  JP drain placed. 10/10/2020 ERCP with sphincterotomy and placement of plastic stent.  Developed post ERCP pancreatitis. Plan was for repeat ERCP and likely stent removal in 6 weeks.   Discharged after 4/6 - 4/13 admission.  Readmitted 4/29 from surgeons's office.   Pt has developed large, complex fluid collection at the pancreatic head, uncinate, proximal body.  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  The necrotic collection is causing mass-effect on the gastric antrum/pylorus/proximal duodenum with reactive small bowel thickening.  Stent in stable position.  Intrapelvic bile duct dilation is mild to moderate likely due to mass-effect from the fluid collection.  There is fluid edema surrounding the pancreas extending into the retroperitoneum and mesentery.  Reactive thickening in the colon.   Significant Events:  5/5 ERCP / EUS - see Op Note findings.    5/6 LFT's improved, jaundice resolved 5/7 ERCP - see op note. Prior stent accessible now and new one placed. Partial necrosectomy 5/9 ERCP - necrosectomy and replacement of stent 5/12 ERCP with necrosectomy  Assessment & Plan   Principal Problem:   Disorder of bile duct stent Active Problems:   Pancreatic necrosis   Elevated LFTs   Ascending cholangitis   Protein-calorie  malnutrition, severe   Bile leak   Post-ERCP Pancreatitis with necrotic fluid collection 4/7 Bile leak status post cholecystectomy 4/6 -  JP drain in place and will be followed up by Dr. Constance Haw after discharge. Initially admitted at [facility]. General surgery there Dr. Constance Haw d/w Dr. Rush Landmark who advised transfer to [facility] for possible advanced ERCP.  --GI following, see their recs --For repeat ERCP, further necrosectomy on 5/12 --Diet per GI --Continue empiric Merrem --Follow CBC --RD consult for supplement recs --Pain control, antiemetics PRN --cultures from pancreatic cyst fluid shows moderate candida albicans --continue on diflucan --appreciate ID assistance -will receive meropenem likely through the weekend and can consider transition to augmentin once WBC has trended down and he remains afebrile -plan is to continue on augmentin and diflucan for another 4 weeks until he can follow up with GI to have repeat ERCP and possible stent removal -will likely have repeat CT in the next few weeks  Leukocytosis - wbc has trended back up since last ERCP. He initially had fever after procedure, but appears to have been afebrile since then. Continue to monitor  Bilateral lower extremity edema - progressive over past several days.  Good response to trial of IV Lasix 5/8. --Continue IV Lasix 40 mg to BID  --Monitor renal function and electrolytes --Duplex U/S LE's negative for DVT --D-dimer elevated, but suspect that is related to pancreatitis --Echo last month poor study, EF >75%, no mention of diastolic findings - Suspect hypoalbuminemia from poor po intake contributing to anasarca --Started on albumin infusions with lasix - urine output has been fair --Likely needs oral diuretic at d/c at  least PRN swelling.  COPD - stable, without no exacerbation sx's.   --Duonebs PRN  Schizoaffective disorder, Depression - stable. --resume Risperdal, trazodone  Obesity: Body mass index  is 38.19 kg/m.   Complicates overall care and prognosis.  Recommend lifestyle modifications including physical activity and diet for weight loss and overall long-term health.   DVT prophylaxis: resume heparin, hold for any procedures   Diet:  Diet Orders (From admission, onward)    Start     Ordered   11/14/20 1333  DIET SOFT Room service appropriate? Yes; Fluid consistency: Thin  Diet effective now       Question Answer Comment  Room service appropriate? Yes   Fluid consistency: Thin      11/14/20 1332            Code Status: Full Code    Subjective 11/16/20    Patient is seen walking in the halls. He feels well. Tolerating diet. Abd pain is controlled.  Disposition Plan & Communication   Status is: Inpatient  Inpatient status appropriate due to severity of illness and ongoing diagnostic evaluation not appropriate for outpatient setting. Requires close monitoring and IV antibiotics.  May need further procedure/s.  Dispo: The patient is from: home              Anticipated d/c is to: home              Patient currently not medically stable for d/c   Difficult to place patient no   Consults, Procedures, Significant Events   Consultants:   Gastroenterology, Dr. Rush Landmark  Infectious disease  Procedures:   5/5 - ERCP  5/7 - ERCP  5/9 - ERCP with necrosectomy  5/12 ERCP with necrosectomy  Antimicrobials:  Anti-infectives (From admission, onward)   Start     Dose/Rate Route Frequency Ordered Stop   11/15/20 1400  meropenem (MERREM) 1 g in sodium chloride 0.9 % 100 mL IVPB        1 g 200 mL/hr over 30 Minutes Intravenous Every 8 hours 11/15/20 1130     11/15/20 1215  amoxicillin-clavulanate (AUGMENTIN) 875-125 MG per tablet 1 tablet  Status:  Discontinued        1 tablet Oral Every 12 hours 11/15/20 1122 11/15/20 1129   11/11/20 1315  fluconazole (DIFLUCAN) tablet 200 mg  Status:  Discontinued        200 mg Oral Daily 11/11/20 1221 11/11/20 1226   11/11/20  1315  fluconazole (DIFLUCAN) tablet 400 mg        400 mg Oral Daily 11/11/20 1226     11/01/20 2200  piperacillin-tazobactam (ZOSYN) IVPB 3.375 g  Status:  Discontinued        3.375 g 12.5 mL/hr over 240 Minutes Intravenous Every 8 hours 11/01/20 1335 11/01/20 1406   11/01/20 1500  meropenem (MERREM) 1 g in sodium chloride 0.9 % 100 mL IVPB  Status:  Discontinued        1 g 200 mL/hr over 30 Minutes Intravenous Every 8 hours 11/01/20 1413 11/15/20 1122   11/01/20 1430  piperacillin-tazobactam (ZOSYN) IVPB 3.375 g  Status:  Discontinued        3.375 g 100 mL/hr over 30 Minutes Intravenous  Once 11/01/20 1353 11/01/20 1406        Micro    Objective   Vitals:   11/15/20 2021 11/15/20 2346 11/16/20 0326 11/16/20 0820  BP: 133/64 123/71 121/80 133/80  Pulse: 88 82 90 100  Resp: 20 20 18  15  Temp: 98.6 F (37 C) 97.9 F (36.6 C) 98.9 F (37.2 C) 98.5 F (36.9 C)  TempSrc: Oral Oral Oral Oral  SpO2: 98% 97% 97% 93%  Weight:      Height:        Intake/Output Summary (Last 24 hours) at 11/16/2020 1302 Last data filed at 11/16/2020 1200 Gross per 24 hour  Intake 20.03 ml  Output 1325 ml  Net -1304.97 ml   Filed Weights   11/06/20 0109 11/08/20 0340 11/09/20 0353  Weight: 120.3 kg 118.9 kg 119 kg    Physical Exam:  General exam: Alert, awake, oriented x 3 Respiratory system: Clear to auscultation. Respiratory effort normal. Cardiovascular system:RRR. No murmurs, rubs, gallops. Gastrointestinal system: Abdomen is nondistended, soft and nontender. No organomegaly or masses felt. Normal bowel sounds heard. Central nervous system: Alert and oriented. No focal neurological deficits. Extremities: 1-2+ edema bilaterally Skin: No rashes, lesions or ulcers Psychiatry: Judgement and insight appear normal. Mood & affect appropriate.    Labs   Data Reviewed: I have personally reviewed following labs and imaging studies  CBC: Recent Labs  Lab 11/12/20 0401 11/13/20 0151  11/14/20 0130 11/15/20 0941 11/16/20 0141  WBC 15.3* 14.7* 15.5* 17.2* 17.5*  HGB 9.6* 10.5* 9.3* 9.3* 9.3*  HCT 30.3* 33.1* 29.0* 29.8* 29.8*  MCV 95.3 94.8 93.9 94.3 94.6  PLT 421* 437* 395 407* 950   Basic Metabolic Panel: Recent Labs  Lab 11/12/20 0401 11/13/20 0151 11/14/20 0130 11/15/20 0941 11/16/20 0141  NA 135 133* 134* 135 134*  K 4.4 4.2 4.1 4.5 4.0  CL 92* 93* 92* 91* 93*  CO2 36* 33* 31 34* 29  GLUCOSE 114* 162* 124* 112* 108*  BUN 10 11 10 13 12   CREATININE 0.89 0.91 0.81 0.93 0.77  CALCIUM 7.9* 7.9* 8.3* 8.3* 8.4*  PHOS  --   --   --  4.7* 4.1   GFR: Estimated Creatinine Clearance: 143.3 mL/min (by C-G formula based on SCr of 0.77 mg/dL). Liver Function Tests: Recent Labs  Lab 11/10/20 0039 11/11/20 0313 11/12/20 0401 11/13/20 0151 11/14/20 0130 11/15/20 0941 11/16/20 0141  AST 23 27 26 25 19   --   --   ALT 33 29 26 25 21   --   --   ALKPHOS 429* 381* 295* 297* 234*  --   --   BILITOT 1.2 1.0 1.1 1.0 0.8  --   --   PROT 5.4* 5.6* 5.4* 6.0* 6.3*  --   --   ALBUMIN 1.5* 1.4* 1.4* 1.5* 2.1* 2.4* 2.2*   No results for input(s): LIPASE, AMYLASE in the last 168 hours. No results for input(s): AMMONIA in the last 168 hours. Coagulation Profile: No results for input(s): INR, PROTIME in the last 168 hours. Cardiac Enzymes: No results for input(s): CKTOTAL, CKMB, CKMBINDEX, TROPONINI in the last 168 hours. BNP (last 3 results) No results for input(s): PROBNP in the last 8760 hours. HbA1C: No results for input(s): HGBA1C in the last 72 hours. CBG: Recent Labs  Lab 11/12/20 0754 11/12/20 1722 11/13/20 0846 11/13/20 1157 11/13/20 1600  GLUCAP 144* 112* 141* 121* 121*   Lipid Profile: No results for input(s): CHOL, HDL, LDLCALC, TRIG, CHOLHDL, LDLDIRECT in the last 72 hours. Thyroid Function Tests: No results for input(s): TSH, T4TOTAL, FREET4, T3FREE, THYROIDAB in the last 72 hours. Anemia Panel: No results for input(s): VITAMINB12, FOLATE,  FERRITIN, TIBC, IRON, RETICCTPCT in the last 72 hours. Sepsis Labs: No results for input(s): PROCALCITON, LATICACIDVEN in  the last 168 hours.  Recent Results (from the past 240 hour(s))  Aerobic/Anaerobic Culture w Gram Stain (surgical/deep wound)     Status: None   Collection Time: 11/07/20  3:03 PM   Specimen: PATH Cytology FNA; Body Fluid  Result Value Ref Range Status   Specimen Description FLUID PANCREATIC CYST DRAINAGE  Final   Special Requests A  Final   Gram Stain NO WBC SEEN RARE YEAST   Final   Culture   Final    MODERATE CANDIDA ALBICANS NO ANAEROBES ISOLATED Performed at [facility], [address], [place], [place] [address]    Report Status 11/12/2020 FINAL  Final      Imaging Studies   No results found.   Medications   Scheduled Meds: . feeding supplement  237 mL Oral TID BM  . fluconazole  400 mg Oral Daily  . furosemide  40 mg Intravenous BID  . heparin injection (subcutaneous)  5,000 Units Subcutaneous Q8H  . multivitamin with minerals  1 tablet Oral Daily  . polyethylene glycol  17 g Oral Daily  . risperiDONE  1 mg Oral QHS  . senna-docusate  1 tablet Oral BID  . traZODone  150 mg Oral QHS   Continuous Infusions: . albumin human    . meropenem (MERREM) IV 1 g (11/16/20 0541)       LOS: 15 days    Time spent: 30 minutes with >50% spent at bedside and in coordination of care.     Kathie Dike, MD [facility]  11/16/2020, 1:02 PM      If 7PM-7AM, please contact night-coverage. How to contact the [facility] Attending or Consulting provider [place] or covering provider during after hours [employer], for this patient?    1. Check the care team in [facility] and look for a) attending/consulting [facility] provider listed and b) the [facility] team listed 2. Log into [email or web address] and use [place] universal password to access. If you do not have the password, please contact the hospital operator. 3. Locate the [facility] provider you are looking for under  [facility] and page to a number that you can be directly reached. 4. If you still have difficulty reaching the provider, please page the [facility] (Director on Call) for the Hospitalists listed on amion for assistance.

## 2020-11-16 NOTE — Progress Notes (Signed)
Progress Note for [facility]  Subjective: No complaints.  He reports feeling well.  Objective: Vital signs in last 24 hours: Temp:  [97.9 F (36.6 C)-99.7 F (37.6 C)] 98.9 F (37.2 C) (05/14 0326) Pulse Rate:  [82-100] 90 (05/14 0326) Resp:  [14-20] 18 (05/14 0326) BP: (118-139)/(64-80) 121/80 (05/14 0326) SpO2:  [91 %-98 %] 97 % (05/14 0326) Last BM Date: 11/14/20  Intake/Output from previous day: 05/13 0701 - 05/14 0700 In: 556 [P.O.:536; IV Piggyback:20] Out: 8295 [Urine:1000; Drains:255] Intake/Output this shift: No intake/output data recorded.  General appearance: alert and no distress GI: soft, non-tender; bowel sounds normal; no masses,  no organomegaly  Lab Results: Recent Labs    11/14/20 0130 11/15/20 0941 11/16/20 0141  WBC 15.5* 17.2* 17.5*  HGB 9.3* 9.3* 9.3*  HCT 29.0* 29.8* 29.8*  PLT 395 407* 388   BMET Recent Labs    11/14/20 0130 11/15/20 0941 11/16/20 0141  NA 134* 135 134*  K 4.1 4.5 4.0  CL 92* 91* 93*  CO2 31 34* 29  GLUCOSE 124* 112* 108*  BUN 10 13 12   CREATININE 0.81 0.93 0.77  CALCIUM 8.3* 8.3* 8.4*   LFT Recent Labs    11/14/20 0130 11/15/20 0941 11/16/20 0141  PROT 6.3*  --   --   ALBUMIN 2.1*   < > 2.2*  AST 19  --   --   ALT 21  --   --   ALKPHOS 234*  --   --   BILITOT 0.8  --   --    < > = values in this interval not displayed.   PT/INR No results for input(s): LABPROT, INR in the last 72 hours. Hepatitis Panel No results for input(s): HEPBSAG, HCVAB, HEPAIGM, HEPBIGM in the last 72 hours. C-Diff No results for input(s): CDIFFTOX in the last 72 hours. Fecal Lactopherrin No results for input(s): FECLLACTOFRN in the last 72 hours.  Studies/Results: No results found.  Medications:  Scheduled: . feeding supplement  237 mL Oral TID BM  . fluconazole  400 mg Oral Daily  . furosemide  40 mg Intravenous BID  . heparin injection (subcutaneous)  5,000 Units Subcutaneous Q8H  . indomethacin  100 mg Rectal Once   . multivitamin with minerals  1 tablet Oral Daily  . polyethylene glycol  17 g Oral Daily  . risperiDONE  1 mg Oral QHS  . senna-docusate  1 tablet Oral BID  . traZODone  150 mg Oral QHS   Continuous: . meropenem (MERREM) IV 1 g (11/16/20 0541)    Assessment/Plan: 1) Necrotizing pancreatitis with necrosectomy. 2) Malnutrition.   Clinically he is well.  There is no change with his WBC and he remains afebrile.  Plan: 1) Continue with supportive care. 2) Continue with meropenem and fluconazole per ID.  LOS: 15 days   Lucette Kratz D 11/16/2020, 7:43 AM

## 2020-11-17 ENCOUNTER — Encounter (HOSPITAL_COMMUNITY): Payer: Self-pay | Admitting: Gastroenterology

## 2020-11-17 LAB — CBC
HCT: 28.1 % — ABNORMAL LOW (ref 39.0–52.0)
Hemoglobin: 9 g/dL — ABNORMAL LOW (ref 13.0–17.0)
MCH: 29.7 pg (ref 26.0–34.0)
MCHC: 32 g/dL (ref 30.0–36.0)
MCV: 92.7 fL (ref 80.0–100.0)
Platelets: 367 10*3/uL (ref 150–400)
RBC: 3.03 MIL/uL — ABNORMAL LOW (ref 4.22–5.81)
RDW: 13.5 % (ref 11.5–15.5)
WBC: 15.1 10*3/uL — ABNORMAL HIGH (ref 4.0–10.5)
nRBC: 0 % (ref 0.0–0.2)

## 2020-11-17 LAB — RENAL FUNCTION PANEL
Albumin: 2.4 g/dL — ABNORMAL LOW (ref 3.5–5.0)
Anion gap: 9 (ref 5–15)
BUN: 11 mg/dL (ref 6–20)
CO2: 29 mmol/L (ref 22–32)
Calcium: 8.3 mg/dL — ABNORMAL LOW (ref 8.9–10.3)
Chloride: 97 mmol/L — ABNORMAL LOW (ref 98–111)
Creatinine, Ser: 0.77 mg/dL (ref 0.61–1.24)
GFR, Estimated: >60 mL/min (ref >60)
Glucose, Bld: 157 mg/dL — ABNORMAL HIGH (ref 70–99)
Phosphorus: 4.6 mg/dL (ref 2.5–4.6)
Potassium: 3.5 mmol/L (ref 3.5–5.1)
Sodium: 135 mmol/L (ref 135–145)

## 2020-11-17 NOTE — Progress Notes (Signed)
PROGRESS NOTE    Steve Romero   [id number]  DOB: 1970/12/29  PCP: [name], DO    DOA: 11/01/2020 LOS: 15   Brief Narrative   Steve Romero is a 50 y.o. male with medical history significant for COPD, bipolar disorder, schizoaffective disorder and depression presented to the ED with persistent right-sided abdominal pain, N/V, poor appetite and early satiety.  Also noted increased JP drain output.   Initially admitted at [facility] on 4/29, transferred to [facility] for GI intervention.  Recent History: 10/09/20 cholecystectomy complicated by postop bile leak due to torn cystic duct that was clipped.  JP drain placed. 10/10/2020 ERCP with sphincterotomy and placement of plastic stent.  Developed post ERCP pancreatitis. Plan was for repeat ERCP and likely stent removal in 6 weeks.   Discharged after 4/6 - 4/13 admission.  Readmitted 4/29 from surgeons's office.   Pt has developed large, complex fluid collection at the pancreatic head, uncinate, proximal body.  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  The necrotic collection is causing mass-effect on the gastric antrum/pylorus/proximal duodenum with reactive small bowel thickening.  Stent in stable position.  Intrapelvic bile duct dilation is mild to moderate likely due to mass-effect from the fluid collection.  There is fluid edema surrounding the pancreas extending into the retroperitoneum and mesentery.  Reactive thickening in the colon.   Significant Events:  5/5 ERCP / EUS - see Op Note findings.    5/6 LFT's improved, jaundice resolved 5/7 ERCP - see op note. Prior stent accessible now and new one placed. Partial necrosectomy 5/9 ERCP - necrosectomy and replacement of stent 5/12 ERCP with necrosectomy  Assessment & Plan   Principal Problem:   Disorder of bile duct stent Active Problems:   Pancreatic necrosis   Elevated LFTs   Ascending cholangitis   Protein-calorie  malnutrition, severe   Bile leak   Post-ERCP Pancreatitis with necrotic fluid collection 4/7 Bile leak status post cholecystectomy 4/6 -  JP drain in place and will be followed up by Dr. Constance Haw after discharge. Initially admitted at [facility]. General surgery there Dr. Constance Haw d/w Dr. Rush Landmark who advised transfer to [facility] for possible advanced ERCP.  --GI following, see their recs --For repeat ERCP, further necrosectomy on 5/12 --Diet per GI --Continue empiric Merrem --Follow CBC --RD consult for supplement recs --Pain control, antiemetics PRN --cultures from pancreatic cyst fluid shows moderate candida albicans --continue on diflucan --appreciate ID assistance -will receive meropenem likely through the weekend and can consider transition to augmentin once WBC has trended down and he remains afebrile -plan is to continue on augmentin and diflucan for another 4 weeks until he can follow up with GI to have repeat ERCP and possible stent removal -will likely have repeat CT in the next few weeks  Leukocytosis - mild downtrend in wbc count over last 24 hours. No fevers. Continue to monitor  Bilateral lower extremity edema - progressive over past several days.  Good response to trial of IV Lasix 5/8. --Continue IV Lasix 40 mg to BID  --Monitor renal function and electrolytes --Duplex U/S LE's negative for DVT --D-dimer elevated, but suspect that is related to pancreatitis --Echo last month poor study, EF >75%, no mention of diastolic findings - Suspect hypoalbuminemia from poor po intake contributing to anasarca --Started on albumin infusions with lasix - urine output has been fair --Likely needs oral diuretic at d/c at least PRN swelling.  COPD - stable, without no exacerbation sx's.   --  Duonebs PRN  Schizoaffective disorder, Depression - stable. --resume Risperdal, trazodone  Obesity: Body mass index is 38.19 kg/m.   Complicates overall care and prognosis.  Recommend  lifestyle modifications including physical activity and diet for weight loss and overall long-term health.   DVT prophylaxis: resume heparin, hold for any procedures   Diet:  Diet Orders (From admission, onward)    Start     Ordered   11/14/20 1333  DIET SOFT Room service appropriate? Yes; Fluid consistency: Thin  Diet effective now       Question Answer Comment  Room service appropriate? Yes   Fluid consistency: Thin      11/14/20 1332            Code Status: Full Code    Subjective 11/17/20    No new complaints  Disposition Plan & Communication   Status is: Inpatient  Inpatient status appropriate due to severity of illness and ongoing diagnostic evaluation not appropriate for outpatient setting. Requires close monitoring and IV antibiotics.  May need further procedure/s.  Dispo: The patient is from: home              Anticipated d/c is to: home              Patient currently not medically stable for d/c   Difficult to place patient no   Consults, Procedures, Significant Events   Consultants:   Gastroenterology, Dr. Rush Landmark  Infectious disease  Procedures:   5/5 - ERCP  5/7 - ERCP  5/9 - ERCP with necrosectomy  5/12 ERCP with necrosectomy  Antimicrobials:  Anti-infectives (From admission, onward)   Start     Dose/Rate Route Frequency Ordered Stop   11/15/20 1400  meropenem (MERREM) 1 g in sodium chloride 0.9 % 100 mL IVPB        1 g 200 mL/hr over 30 Minutes Intravenous Every 8 hours 11/15/20 1130     11/15/20 1215  amoxicillin-clavulanate (AUGMENTIN) 875-125 MG per tablet 1 tablet  Status:  Discontinued        1 tablet Oral Every 12 hours 11/15/20 1122 11/15/20 1129   11/11/20 1315  fluconazole (DIFLUCAN) tablet 200 mg  Status:  Discontinued        200 mg Oral Daily 11/11/20 1221 11/11/20 1226   11/11/20 1315  fluconazole (DIFLUCAN) tablet 400 mg        400 mg Oral Daily 11/11/20 1226     11/01/20 2200  piperacillin-tazobactam (ZOSYN) IVPB  3.375 g  Status:  Discontinued        3.375 g 12.5 mL/hr over 240 Minutes Intravenous Every 8 hours 11/01/20 1335 11/01/20 1406   11/01/20 1500  meropenem (MERREM) 1 g in sodium chloride 0.9 % 100 mL IVPB  Status:  Discontinued        1 g 200 mL/hr over 30 Minutes Intravenous Every 8 hours 11/01/20 1413 11/15/20 1122   11/01/20 1430  piperacillin-tazobactam (ZOSYN) IVPB 3.375 g  Status:  Discontinued        3.375 g 100 mL/hr over 30 Minutes Intravenous  Once 11/01/20 1353 11/01/20 1406        Micro    Objective   Vitals:   11/16/20 2240 11/17/20 0300 11/17/20 0829 11/17/20 1119  BP: 136/84 136/71 135/88 129/80  Pulse: 99 94 100 93  Resp: 19 18 18 18   Temp: 99.7 F (37.6 C) 99.8 F (37.7 C) 99 F (37.2 C) 98.4 F (36.9 C)  TempSrc: Oral Oral Oral Oral  SpO2: 97% 99% 100% 97%  Weight:      Height:        Intake/Output Summary (Last 24 hours) at 11/17/2020 1208 Last data filed at 11/17/2020 0935 Gross per 24 hour  Intake 600 ml  Output 860 ml  Net -260 ml   Filed Weights   11/06/20 0109 11/08/20 0340 11/09/20 0353  Weight: 120.3 kg 118.9 kg 119 kg    Physical Exam:  General exam: Alert, awake, oriented x 3 Respiratory system: Clear to auscultation. Respiratory effort normal. Cardiovascular system:RRR. No murmurs, rubs, gallops. Gastrointestinal system: Abdomen is nondistended, soft and nontender. No organomegaly or masses felt. Normal bowel sounds heard. Central nervous system: Alert and oriented. No focal neurological deficits. Extremities: 1+ edema bilaterally Skin: No rashes, lesions or ulcers Psychiatry: Judgement and insight appear normal. Mood & affect appropriate.      Labs   Data Reviewed: I have personally reviewed following labs and imaging studies  CBC: Recent Labs  Lab 11/13/20 0151 11/14/20 0130 11/15/20 0941 11/16/20 0141 11/17/20 0100  WBC 14.7* 15.5* 17.2* 17.5* 15.1*  HGB 10.5* 9.3* 9.3* 9.3* 9.0*  HCT 33.1* 29.0* 29.8* 29.8* 28.1*   MCV 94.8 93.9 94.3 94.6 92.7  PLT 437* 395 407* 388 564   Basic Metabolic Panel: Recent Labs  Lab 11/13/20 0151 11/14/20 0130 11/15/20 0941 11/16/20 0141 11/17/20 0100  NA 133* 134* 135 134* 135  K 4.2 4.1 4.5 4.0 3.5  CL 93* 92* 91* 93* 97*  CO2 33* 31 34* 29 29  GLUCOSE 162* 124* 112* 108* 157*  BUN 11 10 13 12 11   CREATININE 0.91 0.81 0.93 0.77 0.77  CALCIUM 7.9* 8.3* 8.3* 8.4* 8.3*  PHOS  --   --  4.7* 4.1 4.6   GFR: Estimated Creatinine Clearance: 143.3 mL/min (by C-G formula based on SCr of 0.77 mg/dL). Liver Function Tests: Recent Labs  Lab 11/11/20 0313 11/12/20 0401 11/13/20 0151 11/14/20 0130 11/15/20 0941 11/16/20 0141 11/17/20 0100  AST 27 26 25 19   --   --   --   ALT 29 26 25 21   --   --   --   ALKPHOS 381* 295* 297* 234*  --   --   --   BILITOT 1.0 1.1 1.0 0.8  --   --   --   PROT 5.6* 5.4* 6.0* 6.3*  --   --   --   ALBUMIN 1.4* 1.4* 1.5* 2.1* 2.4* 2.2* 2.4*   No results for input(s): LIPASE, AMYLASE in the last 168 hours. No results for input(s): AMMONIA in the last 168 hours. Coagulation Profile: No results for input(s): INR, PROTIME in the last 168 hours. Cardiac Enzymes: No results for input(s): CKTOTAL, CKMB, CKMBINDEX, TROPONINI in the last 168 hours. BNP (last 3 results) No results for input(s): PROBNP in the last 8760 hours. HbA1C: No results for input(s): HGBA1C in the last 72 hours. CBG: Recent Labs  Lab 11/12/20 0754 11/12/20 1722 11/13/20 0846 11/13/20 1157 11/13/20 1600  GLUCAP 144* 112* 141* 121* 121*   Lipid Profile: No results for input(s): CHOL, HDL, LDLCALC, TRIG, CHOLHDL, LDLDIRECT in the last 72 hours. Thyroid Function Tests: No results for input(s): TSH, T4TOTAL, FREET4, T3FREE, THYROIDAB in the last 72 hours. Anemia Panel: No results for input(s): VITAMINB12, FOLATE, FERRITIN, TIBC, IRON, RETICCTPCT in the last 72 hours. Sepsis Labs: No results for input(s): PROCALCITON, LATICACIDVEN in the last 168  hours.  Recent Results (from the past 240 hour(s))  Aerobic/Anaerobic Culture w  Gram Stain (surgical/deep wound)     Status: None   Collection Time: 11/07/20  3:03 PM   Specimen: PATH Cytology FNA; Body Fluid  Result Value Ref Range Status   Specimen Description FLUID PANCREATIC CYST DRAINAGE  Final   Special Requests A  Final   Gram Stain NO WBC SEEN RARE YEAST   Final   Culture   Final    MODERATE CANDIDA ALBICANS NO ANAEROBES ISOLATED Performed at [facility], [address], [place], [place] [address]    Report Status 11/12/2020 FINAL  Final      Imaging Studies   No results found.   Medications   Scheduled Meds: . feeding supplement  237 mL Oral TID BM  . fluconazole  400 mg Oral Daily  . furosemide  40 mg Intravenous BID  . heparin injection (subcutaneous)  5,000 Units Subcutaneous Q8H  . multivitamin with minerals  1 tablet Oral Daily  . polyethylene glycol  17 g Oral Daily  . risperiDONE  1 mg Oral QHS  . senna-docusate  1 tablet Oral BID  . traZODone  150 mg Oral QHS   Continuous Infusions: . meropenem (MERREM) IV 1 g (11/17/20 0528)       LOS: 16 days    Time spent: 30 minutes with >50% spent at bedside and in coordination of care.     Kathie Dike, MD [facility]  11/17/2020, 12:08 PM      If 7PM-7AM, please contact night-coverage. How to contact the [facility] Attending or Consulting provider [place] or covering provider during after hours [place], for this patient?    1. Check the care team in [facility] and look for a) attending/consulting [facility] provider listed and b) the [facility] team listed 2. Log into [email or web address] and use [place]'s universal password to access. If you do not have the password, please contact the hospital operator. 3. Locate the [facility] provider you are looking for under [facility] and page to a number that you can be directly reached. 4. If you still have difficulty reaching the provider, please page the [facility]  (Director on Call) for the Hospitalists listed on amion for assistance.

## 2020-11-17 NOTE — Progress Notes (Signed)
Progress Note for [facility]  Subjective: He complains of some abdominal pain associated with the necrosectomy.  This is not a new pain.  Objective: Vital signs in last 24 hours: Temp:  [98.4 F (36.9 C)-99.8 F (37.7 C)] 99.8 F (37.7 C) (05/15 0300) Pulse Rate:  [94-106] 94 (05/15 0300) Resp:  [17-19] 18 (05/15 0300) BP: (129-136)/(67-84) 136/71 (05/15 0300) SpO2:  [94 %-100 %] 99 % (05/15 0300) Last BM Date: 11/15/20  Intake/Output from previous day: 05/14 0701 - 05/15 0700 In: 920 [P.O.:920] Out: 760 [Urine:500; Drains:260] Intake/Output this shift: No intake/output data recorded.  General appearance: alert and no distress Resp: clear to auscultation bilaterally Cardio: regular rate and rhythm GI: some tenderness in the mid to upper abdomen Extremities: extremities normal, atraumatic, no cyanosis or edema  Lab Results: Recent Labs    11/15/20 0941 11/16/20 0141 11/17/20 0100  WBC 17.2* 17.5* 15.1*  HGB 9.3* 9.3* 9.0*  HCT 29.8* 29.8* 28.1*  PLT 407* 388 367   BMET Recent Labs    11/15/20 0941 11/16/20 0141 11/17/20 0100  NA 135 134* 135  K 4.5 4.0 3.5  CL 91* 93* 97*  CO2 34* 29 29  GLUCOSE 112* 108* 157*  BUN 13 12 11   CREATININE 0.93 0.77 0.77  CALCIUM 8.3* 8.4* 8.3*   LFT Recent Labs    11/17/20 0100  ALBUMIN 2.4*   PT/INR No results for input(s): LABPROT, INR in the last 72 hours. Hepatitis Panel No results for input(s): HEPBSAG, HCVAB, HEPAIGM, HEPBIGM in the last 72 hours. C-Diff No results for input(s): CDIFFTOX in the last 72 hours. Fecal Lactopherrin No results for input(s): FECLLACTOFRN in the last 72 hours.  Studies/Results: No results found.  Medications:  Scheduled: . feeding supplement  237 mL Oral TID BM  . fluconazole  400 mg Oral Daily  . furosemide  40 mg Intravenous BID  . heparin injection (subcutaneous)  5,000 Units Subcutaneous Q8H  . multivitamin with minerals  1 tablet Oral Daily  . polyethylene glycol  17 g  Oral Daily  . risperiDONE  1 mg Oral QHS  . senna-docusate  1 tablet Oral BID  . traZODone  150 mg Oral QHS   Continuous: . meropenem (MERREM) IV 1 g (11/17/20 0528)    Assessment/Plan: 1) S/p necrosectomy for an infected pseudocyst. 2) Mild abdominal pain.   The patient's Tmax was at 99 and his WBC did decline to 15.1.  Clinically he remains stable.  Plan: 1) Continue with supportive care. 2) Further recommendation for his necrosectomy management per [facility]. 3) [facility] will resume care in the AM.  LOS: 16 days   Jayln Madeira D 11/17/2020, 8:27 AM

## 2020-11-18 DIAGNOSIS — K8309 Other cholangitis: Secondary | ICD-10-CM | POA: Diagnosis not present

## 2020-11-18 LAB — RENAL FUNCTION PANEL
Albumin: 2.3 g/dL — ABNORMAL LOW (ref 3.5–5.0)
Anion gap: 9 (ref 5–15)
BUN: 10 mg/dL (ref 6–20)
CO2: 31 mmol/L (ref 22–32)
Calcium: 8.5 mg/dL — ABNORMAL LOW (ref 8.9–10.3)
Chloride: 97 mmol/L — ABNORMAL LOW (ref 98–111)
Creatinine, Ser: 0.74 mg/dL (ref 0.61–1.24)
GFR, Estimated: >60 mL/min (ref >60)
Glucose, Bld: 130 mg/dL — ABNORMAL HIGH (ref 70–99)
Phosphorus: 5.2 mg/dL — ABNORMAL HIGH (ref 2.5–4.6)
Potassium: 3.4 mmol/L — ABNORMAL LOW (ref 3.5–5.1)
Sodium: 137 mmol/L (ref 135–145)

## 2020-11-18 LAB — CBC
HCT: 30.1 % — ABNORMAL LOW (ref 39.0–52.0)
Hemoglobin: 9.4 g/dL — ABNORMAL LOW (ref 13.0–17.0)
MCH: 29.5 pg (ref 26.0–34.0)
MCHC: 31.2 g/dL (ref 30.0–36.0)
MCV: 94.4 fL (ref 80.0–100.0)
Platelets: 421 10*3/uL — ABNORMAL HIGH (ref 150–400)
RBC: 3.19 MIL/uL — ABNORMAL LOW (ref 4.22–5.81)
RDW: 13.6 % (ref 11.5–15.5)
WBC: 16.6 10*3/uL — ABNORMAL HIGH (ref 4.0–10.5)
nRBC: 0.1 % (ref 0.0–0.2)

## 2020-11-18 MED ORDER — LACTATED RINGERS IV SOLN: INTRAVENOUS | Status: AC

## 2020-11-18 MED ORDER — POTASSIUM CHLORIDE CRYS ER 20 MEQ PO TBCR
40.0000 meq | EXTENDED_RELEASE_TABLET | ORAL | Status: AC
  Administered 2020-11-18 (×2): 40 meq via ORAL
  Filled 2020-11-18 (×2): qty 2

## 2020-11-18 NOTE — Progress Notes (Signed)
Daily Rounding Note  11/18/2020, 9:59 AM  LOS: 17 days   SUBJECTIVE:   Chief complaint: Complicated pancreatitis.  Status post necrosectomy.  Continues to do well.  Tolerating diet.  Having regular bowel movements.  No nausea or vomiting.  Minor abdominal pain.  OBJECTIVE:         Vital signs in last 24 hours:    Temp:  [98.4 F (36.9 C)-99 F (37.2 C)] 98.5 F (36.9 C) (05/16 0824) Pulse Rate:  [93-109] 109 (05/16 0824) Resp:  [14-20] 20 (05/16 0824) BP: (129-153)/(75-85) 142/80 (05/16 0824) SpO2:  [91 %-97 %] 96 % (05/16 0824) Last BM Date: 11/17/20 Filed Weights   11/06/20 0109 11/08/20 0340 11/09/20 0353  Weight: 120.3 kg 118.9 kg 119 kg   General: Obese, comfortable, sleepy but arousable Heart: RRR. Chest: Clear bilaterally. Abdomen: Soft, distended, active bowel sounds.  Slight tenderness in the epigastrium and upper abdomen but no guarding or rebound. Extremities: Lower extremity edema. Neuro/Psych: Alert.  Appropriate.  Oriented x3.  No tremors or gross deficits.  Intake/Output from previous day: 05/15 0701 - 05/16 0700 In: 240 [P.O.:240] Out: 525 [Urine:400; Drains:125]  Intake/Output this shift: Total I/O In: 220 [P.O.:220] Out: 25 [Drains:25]  Lab Results: Recent Labs    11/16/20 0141 11/17/20 0100 11/18/20 0142  WBC 17.5* 15.1* 16.6*  HGB 9.3* 9.0* 9.4*  HCT 29.8* 28.1* 30.1*  PLT 388 367 421*   BMET Recent Labs    11/16/20 0141 11/17/20 0100 11/18/20 0142  NA 134* 135 137  K 4.0 3.5 3.4*  CL 93* 97* 97*  CO2 29 29 31   GLUCOSE 108* 157* 130*  BUN 12 11 10   CREATININE 0.77 0.77 0.74  CALCIUM 8.4* 8.3* 8.5*   LFT Recent Labs    11/16/20 0141 11/17/20 0100 11/18/20 0142  ALBUMIN 2.2* 2.4* 2.3*   PT/INR No results for input(s): LABPROT, INR in the last 72 hours. Hepatitis Panel No results for input(s): HEPBSAG, HCVAB, HEPAIGM, HEPBIGM in the last 72  hours.  Studies/Results: No results found.   Scheduled Meds: . feeding supplement  237 mL Oral TID BM  . fluconazole  400 mg Oral Daily  . furosemide  40 mg Intravenous BID  . heparin injection (subcutaneous)  5,000 Units Subcutaneous Q8H  . multivitamin with minerals  1 tablet Oral Daily  . polyethylene glycol  17 g Oral Daily  . risperiDONE  1 mg Oral QHS  . senna-docusate  1 tablet Oral BID  . traZODone  150 mg Oral QHS   Continuous Infusions: . meropenem (MERREM) IV 1 g (11/18/20 0522)   PRN Meds:.acetaminophen **OR** acetaminophen, bisacodyl, diphenhydrAMINE **OR** diphenhydrAMINE, Gerhardt's butt cream, ipratropium-albuterol, morphine injection, ondansetron **OR** ondansetron (ZOFRAN) IV, oxyCODONE   ASSESMENT:   *  Hx post ERCP pancreatitis with pseudocyst/necrosis. S/p ERCP, cyst gastrostomy, stent placement, necrosectomy x3.  Original biliary stent removed/replaced and a second stent was placed as of 5/9, double-pigtail stents replaced as of latest ERCP 5/12.  Clinically much improved. However WBCs trending up 15.5 >> 17.2 in last 24 hours. Minor fever to 100.6 at 1720 yesterday but afebrile otherwise. Day 18 Meropenem, Flagyl.  Day 8 po Diflucan (rare yeast on cyst drainage)    No fevers, WBCs fluctuatingL  16.6 today.    *    Post cholecystectomy bile leak leading to ERCP and biliary stent placement  *    Severe malnutrition.    Meeting caloric needs per RD calorie  counts..  Appetite has improved dramatically.    Consistently consuming 100% of solid food trays with additional Ensure and multivitamins in place.    *    Stable, normocytic anemia.  *    Moderate esophagitis on 11/11/2020 EGD.    PLAN   *   Await ID input re anti-microbials. Clinically seems like he could go home very soon.       [name]  11/18/2020, 9:59 AM Phone [phone]

## 2020-11-18 NOTE — Progress Notes (Signed)
PROGRESS NOTE    Steve Romero   [id number]  DOB: Mar 07, 1971  PCP: [name], DO    DOA: 11/01/2020 LOS: 83   Brief Narrative   Steve Romero is a 50 y.o. male with medical history significant for COPD, bipolar disorder, schizoaffective disorder and depression presented to the ED with persistent right-sided abdominal pain, N/V, poor appetite and early satiety.  Also noted increased JP drain output.   Initially admitted at [facility] on 4/29, transferred to [facility] for GI intervention.  Recent History: 10/09/20 cholecystectomy complicated by postop bile leak due to torn cystic duct that was clipped.  JP drain placed. 10/10/2020 ERCP with sphincterotomy and placement of plastic stent.  Developed post ERCP pancreatitis. Plan was for repeat ERCP and likely stent removal in 6 weeks.   Discharged after 4/6 - 4/13 admission.  Readmitted 4/29 from surgeons's office.   Pt has developed large, complex fluid collection at the pancreatic head, uncinate, proximal body.  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  The necrotic collection is causing mass-effect on the gastric antrum/pylorus/proximal duodenum with reactive small bowel thickening.  Stent in stable position.  Intrapelvic bile duct dilation is mild to moderate likely due to mass-effect from the fluid collection.  There is fluid edema surrounding the pancreas extending into the retroperitoneum and mesentery.  Reactive thickening in the colon.   Significant Events:  5/5 ERCP / EUS - see Op Note findings.    5/6 LFT's improved, jaundice resolved 5/7 ERCP - see op note. Prior stent accessible now and new one placed. Partial necrosectomy 5/9 ERCP - necrosectomy and replacement of stent 5/12 ERCP with necrosectomy  Assessment & Plan   Principal Problem:   Disorder of bile duct stent Active Problems:   Pancreatic necrosis   Elevated LFTs   Ascending cholangitis   Protein-calorie  malnutrition, severe   Bile leak   Post-ERCP Pancreatitis with necrotic fluid collection 4/7 Bile leak status post cholecystectomy 4/6 -  JP drain in place and will be followed up by Dr. Constance Haw after discharge. Initially admitted at [facility]. General surgery there Dr. Constance Haw d/w Dr. Rush Landmark who advised transfer to [facility] for possible advanced ERCP.  --GI following, see their recs --He has had multiple ERCP with necrosectomy --Diet per GI --currently on empiric Merrem --Follow CBC --RD consult for supplement recs --Pain control, antiemetics PRN --cultures from pancreatic cyst fluid shows moderate candida albicans --continue on diflucan --appreciate ID assistance -plan is to transition meropenem to oral augmentin, will defer timing to Gastroenterology/Infectious Disease -plan is to continue on augmentin and diflucan for another 4 weeks until he can follow up with GI to have repeat ERCP and possible stent removal -will likely have repeat CT in the next few weeks  Leukocytosis - wbc count stable over last 24 hours. No fevers. Continue to monitor  Bilateral lower extremity edema - progressive over past several days.  Good response to trial of IV Lasix 5/8. --Continue IV Lasix 40 mg to BID  --Monitor renal function and electrolytes --Duplex U/S LE's negative for DVT --D-dimer elevated, but suspect that is related to pancreatitis --Echo last month poor study, EF >75%, no mention of diastolic findings - Suspect hypoalbuminemia from poor po intake contributing to anasarca --Started on albumin infusions with lasix - urine output has been fair -overall LE swelling is markedly improved. Will hold off on further diuretics for now --Likely needs oral diuretic at d/c at least PRN swelling.  COPD -  stable, without no exacerbation sx's.   --Duonebs PRN  Schizoaffective disorder, Depression - stable. --resume Risperdal, trazodone  Obesity: Body mass index is 38.19 kg/m.    Complicates overall care and prognosis.  Recommend lifestyle modifications including physical activity and diet for weight loss and overall long-term health.   DVT prophylaxis: resume heparin, hold for any procedures   Diet:  Diet Orders (From admission, onward)    Start     Ordered   11/14/20 1333  DIET SOFT Room service appropriate? Yes; Fluid consistency: Thin  Diet effective now       Question Answer Comment  Room service appropriate? Yes   Fluid consistency: Thin      11/14/20 1332            Code Status: Full Code    Subjective 11/18/20    Ambulating in hall, no fever overnight, reports good po intake, abd pain is reasonably controlled  Disposition Plan & Communication   Status is: Inpatient  Inpatient status appropriate due to severity of illness and ongoing diagnostic evaluation not appropriate for outpatient setting. Requires close monitoring and IV antibiotics.  May need further procedure/s.  Dispo: The patient is from: home              Anticipated d/c is to: home              Patient currently not medically stable for d/c   Difficult to place patient no   Consults, Procedures, Significant Events   Consultants:   Gastroenterology, Dr. Rush Landmark  Infectious disease  Procedures:   5/5 - ERCP  5/7 - ERCP  5/9 - ERCP with necrosectomy  5/12 ERCP with necrosectomy  Antimicrobials:  Anti-infectives (From admission, onward)   Start     Dose/Rate Route Frequency Ordered Stop   11/15/20 1400  meropenem (MERREM) 1 g in sodium chloride 0.9 % 100 mL IVPB        1 g 200 mL/hr over 30 Minutes Intravenous Every 8 hours 11/15/20 1130     11/15/20 1215  amoxicillin-clavulanate (AUGMENTIN) 875-125 MG per tablet 1 tablet  Status:  Discontinued        1 tablet Oral Every 12 hours 11/15/20 1122 11/15/20 1129   11/11/20 1315  fluconazole (DIFLUCAN) tablet 200 mg  Status:  Discontinued        200 mg Oral Daily 11/11/20 1221 11/11/20 1226   11/11/20 1315   fluconazole (DIFLUCAN) tablet 400 mg        400 mg Oral Daily 11/11/20 1226     11/01/20 2200  piperacillin-tazobactam (ZOSYN) IVPB 3.375 g  Status:  Discontinued        3.375 g 12.5 mL/hr over 240 Minutes Intravenous Every 8 hours 11/01/20 1335 11/01/20 1406   11/01/20 1500  meropenem (MERREM) 1 g in sodium chloride 0.9 % 100 mL IVPB  Status:  Discontinued        1 g 200 mL/hr over 30 Minutes Intravenous Every 8 hours 11/01/20 1413 11/15/20 1122   11/01/20 1430  piperacillin-tazobactam (ZOSYN) IVPB 3.375 g  Status:  Discontinued        3.375 g 100 mL/hr over 30 Minutes Intravenous  Once 11/01/20 1353 11/01/20 1406        Micro    Objective   Vitals:   11/17/20 2340 11/18/20 0345 11/18/20 0824 11/18/20 1138  BP: (!) 153/85 (!) 146/84 (!) 142/80 (!) 146/88  Pulse: (!) 105 (!) 103 (!) 109 98  Resp: 16 14 20  19  Temp: 99 F (37.2 C) 99 F (37.2 C) 98.5 F (36.9 C) 98.8 F (37.1 C)  TempSrc: Oral Oral Oral Oral  SpO2: 93% 91% 96% 95%  Weight:      Height:        Intake/Output Summary (Last 24 hours) at 11/18/2020 1150 Last data filed at 11/18/2020 0925 Gross per 24 hour  Intake 220 ml  Output 300 ml  Net -80 ml   Filed Weights   11/06/20 0109 11/08/20 0340 11/09/20 0353  Weight: 120.3 kg 118.9 kg 119 kg    Physical Exam:  General exam: Alert, awake, oriented x 3 Respiratory system: Clear to auscultation. Respiratory effort normal. Cardiovascular system:RRR. No murmurs, rubs, gallops. Gastrointestinal system: Abdomen is nondistended, soft and nontender. No organomegaly or masses felt. Normal bowel sounds heard. Central nervous system: Alert and oriented. No focal neurological deficits. Extremities: no edema bilaterally Skin: No rashes, lesions or ulcers Psychiatry: Judgement and insight appear normal. Mood & affect appropriate.        Labs   Data Reviewed: I have personally reviewed following labs and imaging studies  CBC: Recent Labs  Lab 11/14/20 0130  11/15/20 0941 11/16/20 0141 11/17/20 0100 11/18/20 0142  WBC 15.5* 17.2* 17.5* 15.1* 16.6*  HGB 9.3* 9.3* 9.3* 9.0* 9.4*  HCT 29.0* 29.8* 29.8* 28.1* 30.1*  MCV 93.9 94.3 94.6 92.7 94.4  PLT 395 407* 388 367 220*   Basic Metabolic Panel: Recent Labs  Lab 11/14/20 0130 11/15/20 0941 11/16/20 0141 11/17/20 0100 11/18/20 0142  NA 134* 135 134* 135 137  K 4.1 4.5 4.0 3.5 3.4*  CL 92* 91* 93* 97* 97*  CO2 31 34* 29 29 31   GLUCOSE 124* 112* 108* 157* 130*  BUN 10 13 12 11 10   CREATININE 0.81 0.93 0.77 0.77 0.74  CALCIUM 8.3* 8.3* 8.4* 8.3* 8.5*  PHOS  --  4.7* 4.1 4.6 5.2*   GFR: Estimated Creatinine Clearance: 143.3 mL/min (by C-G formula based on SCr of 0.74 mg/dL). Liver Function Tests: Recent Labs  Lab 11/12/20 0401 11/13/20 0151 11/14/20 0130 11/15/20 0941 11/16/20 0141 11/17/20 0100 11/18/20 0142  AST 26 25 19   --   --   --   --   ALT 26 25 21   --   --   --   --   ALKPHOS 295* 297* 234*  --   --   --   --   BILITOT 1.1 1.0 0.8  --   --   --   --   PROT 5.4* 6.0* 6.3*  --   --   --   --   ALBUMIN 1.4* 1.5* 2.1* 2.4* 2.2* 2.4* 2.3*   No results for input(s): LIPASE, AMYLASE in the last 168 hours. No results for input(s): AMMONIA in the last 168 hours. Coagulation Profile: No results for input(s): INR, PROTIME in the last 168 hours. Cardiac Enzymes: No results for input(s): CKTOTAL, CKMB, CKMBINDEX, TROPONINI in the last 168 hours. BNP (last 3 results) No results for input(s): PROBNP in the last 8760 hours. HbA1C: No results for input(s): HGBA1C in the last 72 hours. CBG: Recent Labs  Lab 11/12/20 0754 11/12/20 1722 11/13/20 0846 11/13/20 1157 11/13/20 1600  GLUCAP 144* 112* 141* 121* 121*   Lipid Profile: No results for input(s): CHOL, HDL, LDLCALC, TRIG, CHOLHDL, LDLDIRECT in the last 72 hours. Thyroid Function Tests: No results for input(s): TSH, T4TOTAL, FREET4, T3FREE, THYROIDAB in the last 72 hours. Anemia Panel: No results for input(s):  VITAMINB12, FOLATE, FERRITIN, TIBC, IRON, RETICCTPCT in the last 72 hours. Sepsis Labs: No results for input(s): PROCALCITON, LATICACIDVEN in the last 168 hours.  No results found for this or any previous visit (from the past 240 hour(s)).    Imaging Studies   No results found.   Medications   Scheduled Meds: . feeding supplement  237 mL Oral TID BM  . fluconazole  400 mg Oral Daily  . heparin injection (subcutaneous)  5,000 Units Subcutaneous Q8H  . multivitamin with minerals  1 tablet Oral Daily  . polyethylene glycol  17 g Oral Daily  . risperiDONE  1 mg Oral QHS  . senna-docusate  1 tablet Oral BID  . traZODone  150 mg Oral QHS   Continuous Infusions: . lactated ringers    . meropenem (MERREM) IV 1 g (11/18/20 0522)       LOS: 17 days    Time spent: 30 minutes with >50% spent at bedside and in coordination of care.     Kathie Dike, MD [facility]  11/18/2020, 11:50 AM      If 7PM-7AM, please contact night-coverage. How to contact the [facility] Attending or Consulting provider [place] or covering provider during after hours [place], for this patient?    1. Check the care team in [facility] and look for a) attending/consulting [facility] provider listed and b) the [facility] team listed 2. Log into [email or web address] and use [facility]'s universal password to access. If you do not have the password, please contact the hospital operator. 3. Locate the [facility] provider you are looking for under [facility] and page to a number that you can be directly reached. 4. If you still have difficulty reaching the provider, please page the [facility] (Director on Call) for the Hospitalists listed on amion for assistance.

## 2020-11-18 NOTE — Progress Notes (Signed)
    [facility] for Infectious Disease    Date of Admission:  11/01/2020   Total days of antibiotics 18   ID: Steve Romero is a 50 y.o. male with  Principal Problem:   Disorder of bile duct stent Active Problems:   Pancreatic necrosis   Elevated LFTs   Ascending cholangitis   Protein-calorie malnutrition, severe   Bile leak    Subjective: Afebrile, feeling better, only mild abdominal pain. Serous fluid in drain  Medications:  . feeding supplement  237 mL Oral TID BM  . fluconazole  400 mg Oral Daily  . heparin injection (subcutaneous)  5,000 Units Subcutaneous Q8H  . multivitamin with minerals  1 tablet Oral Daily  . polyethylene glycol  17 g Oral Daily  . risperiDONE  1 mg Oral QHS  . senna-docusate  1 tablet Oral BID  . traZODone  150 mg Oral QHS    Objective: Vital signs in last 24 hours: Temp:  [98.5 F (36.9 C)-99 F (37.2 C)] 98.8 F (37.1 C) (05/16 1138) Pulse Rate:  [98-109] 98 (05/16 1138) Resp:  [14-20] 19 (05/16 1138) BP: (141-153)/(75-88) 146/88 (05/16 1138) SpO2:  [91 %-96 %] 95 % (05/16 1138) Physical Exam  Constitutional: He is oriented to person, place, and time. He appears well-developed and well-nourished. No distress.  HENT: edentulous Mouth/Throat: Oropharynx is clear and moist. No oropharyngeal exudate.  Cardiovascular: Normal rate, regular rhythm and normal heart sounds. Exam reveals no gallop and no friction rub.  No murmur heard.  Pulmonary/Chest: Effort normal and breath sounds normal. No respiratory distress. He has no wheezes.  Abdominal: Soft. Bowel sounds are normal. He exhibits no distension. There is no tenderness. jp drain serous fluid Neurological: He is alert and oriented to person, place, and time.  Skin: Skin is warm and dry. No rash noted. No erythema.  Psychiatric: He has a normal mood and affect. His behavior is normal.       Lab Results Recent Labs    11/17/20 0100 11/18/20 0142  WBC 15.1* 16.6*  HGB 9.0* 9.4*   HCT 28.1* 30.1*  NA 135 137  K 3.5 3.4*  CL 97* 97*  CO2 29 31  BUN 11 10  CREATININE 0.77 0.74   Liver Panel Recent Labs    11/17/20 0100 11/18/20 0142  ALBUMIN 2.4* 2.3*    Microbiology: reviewed Studies/Results: No results found.   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 Suspect it is overall inflammation from pancreatitis. No recent fevers. Will change to oral amox/clav (in the morning) plus oral fluconazole-plan to treat for 3 additional weeks, through the time period he is getting his drained placed.  -recommend to get weekly CMP to see he is tolerating fluconazole.  - will see back in the ID clinic in 10-14 d   [facility] for Infectious Diseases Cell: [phone] Pager: [phone]  11/18/2020, 11:43 AM

## 2020-11-19 ENCOUNTER — Other Ambulatory Visit: Payer: Self-pay

## 2020-11-19 DIAGNOSIS — K8689 Other specified diseases of pancreas: Secondary | ICD-10-CM

## 2020-11-19 LAB — C-REACTIVE PROTEIN: CRP: 22.2 mg/dL — ABNORMAL HIGH (ref <1.0)

## 2020-11-19 LAB — CBC
HCT: 30.3 % — ABNORMAL LOW (ref 39.0–52.0)
Hemoglobin: 9.5 g/dL — ABNORMAL LOW (ref 13.0–17.0)
MCH: 29.9 pg (ref 26.0–34.0)
MCHC: 31.4 g/dL (ref 30.0–36.0)
MCV: 95.3 fL (ref 80.0–100.0)
Platelets: 415 10*3/uL — ABNORMAL HIGH (ref 150–400)
RBC: 3.18 MIL/uL — ABNORMAL LOW (ref 4.22–5.81)
RDW: 13.7 % (ref 11.5–15.5)
WBC: 17.7 10*3/uL — ABNORMAL HIGH (ref 4.0–10.5)
nRBC: 0.1 % (ref 0.0–0.2)

## 2020-11-19 LAB — RENAL FUNCTION PANEL
Albumin: 2.2 g/dL — ABNORMAL LOW (ref 3.5–5.0)
Anion gap: 8 (ref 5–15)
BUN: 11 mg/dL (ref 6–20)
CO2: 28 mmol/L (ref 22–32)
Calcium: 8.5 mg/dL — ABNORMAL LOW (ref 8.9–10.3)
Chloride: 98 mmol/L (ref 98–111)
Creatinine, Ser: 0.76 mg/dL (ref 0.61–1.24)
GFR, Estimated: >60 mL/min (ref >60)
Glucose, Bld: 128 mg/dL — ABNORMAL HIGH (ref 70–99)
Phosphorus: 4.2 mg/dL (ref 2.5–4.6)
Potassium: 3.9 mmol/L (ref 3.5–5.1)
Sodium: 134 mmol/L — ABNORMAL LOW (ref 135–145)

## 2020-11-19 LAB — SEDIMENTATION RATE: Sed Rate: 44 mm/hr — ABNORMAL HIGH (ref 0–16)

## 2020-11-19 MED ORDER — AMOXICILLIN-POT CLAVULANATE 875-125 MG PO TABS
1.0000 | ORAL_TABLET | Freq: Two times a day (BID) | ORAL | Status: DC
  Administered 2020-11-19 – 2020-11-20 (×3): 1 via ORAL
  Filled 2020-11-19 (×3): qty 1

## 2020-11-19 NOTE — Telephone Encounter (Signed)
The pt has been scheduled for EGD on 12/26/20 at 1130 am at [facility] with Dr Rush Landmark.  He will be given the information at discharge.  I will have the letter entered.  Schedulers have been sent a message to set the CT up 1 week prior.

## 2020-11-19 NOTE — Progress Notes (Signed)
PROGRESS NOTE    Steve Romero   [id number]  DOB: 09-Jan-1971  PCP: [name], DO    DOA: 11/01/2020 LOS: 39   Brief Narrative   Gildo Latimore is a 50 y.o. male with medical history significant for COPD, bipolar disorder, schizoaffective disorder and depression presented to the ED with persistent right-sided abdominal pain, N/V, poor appetite and early satiety.  Also noted increased JP drain output.   Initially admitted at [facility] on 4/29, transferred to [facility] for GI intervention.  Recent History: 10/09/20 cholecystectomy complicated by postop bile leak due to torn cystic duct that was clipped.  JP drain placed. 10/10/2020 ERCP with sphincterotomy and placement of plastic stent.  Developed post ERCP pancreatitis. Plan was for repeat ERCP and likely stent removal in 6 weeks.   Discharged after 4/6 - 4/13 admission.  Readmitted 4/29 from surgeons's office.   Pt has developed large, complex fluid collection at the pancreatic head, uncinate, proximal body.  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  The necrotic collection is causing mass-effect on the gastric antrum/pylorus/proximal duodenum with reactive small bowel thickening.  Stent in stable position.  Intrapelvic bile duct dilation is mild to moderate likely due to mass-effect from the fluid collection.  There is fluid edema surrounding the pancreas extending into the retroperitoneum and mesentery.  Reactive thickening in the colon.   Significant Events:  5/5 ERCP / EUS - see Op Note findings.    5/6 LFT's improved, jaundice resolved 5/7 ERCP - see op note. Prior stent accessible now and new one placed. Partial necrosectomy 5/9 ERCP - necrosectomy and replacement of stent 5/12 ERCP with necrosectomy  Assessment & Plan   Principal Problem:   Disorder of bile duct stent Active Problems:   Pancreatic necrosis   Elevated LFTs   Ascending cholangitis   Protein-calorie  malnutrition, severe   Bile leak  Bile leak status post cholecystectomy 4/6 with JP drain placement -  Post-ERCP Pancreatitis with necrotic fluid collection 4/7  JP drain in place and will be followed up by Dr. Constance Haw after discharge. Initially admitted at [facility]. General surgery there Dr. Constance Haw d/w Dr. Rush Landmark who advised transfer [facility] for possible advanced ERCP.  --GI following, see their recs --He has had multiple ERCP with necrosectomy -- Currently on soft diet -- He was treated with a prolonged course of meropenem, but has since been transitioned to Augmentin --Follow CBC --RD consult for supplement recs --Pain control, antiemetics PRN --cultures from pancreatic cyst fluid shows moderate candida albicans --continue on diflucan --appreciate ID assistance -plan is to continue on augmentin and diflucan for an additional 3 weeks until he can follow up with GI to have repeat ERCP and possible stent removal -will likely have repeat CT in the next few weeks  Leukocytosis -mildly trending up over the last 24 hours.  Ordering CRP/sed rate  Bilateral lower extremity edema - progressive over past several days.  Good response to trial of IV Lasix 5/8. --Continue IV Lasix 40 mg to BID  --Monitor renal function and electrolytes --Duplex U/S LE's negative for DVT --D-dimer elevated, but suspect that is related to pancreatitis --Echo last month poor study, EF >75%, no mention of diastolic findings - Suspect hypoalbuminemia from poor po intake contributing to anasarca --Started on albumin infusions with lasix - urine output has been fair -overall LE swelling is markedly improved. Will hold off on further diuretics for now   COPD - stable, without no exacerbation  sx's.   --Duonebs PRN  Schizoaffective disorder, Depression - stable. --resume Risperdal, trazodone  Obesity: Body mass index is 38.19 kg/m.   Complicates overall care and prognosis.  Recommend lifestyle  modifications including physical activity and diet for weight loss and overall long-term health.   DVT prophylaxis: Heparin   Diet:  Diet Orders (From admission, onward)    Start     Ordered   11/14/20 1333  DIET SOFT Room service appropriate? Yes; Fluid consistency: Thin  Diet effective now       Question Answer Comment  Room service appropriate? Yes   Fluid consistency: Thin      11/14/20 1332            Code Status: Full Code    Subjective 11/19/20    Patient is feeling well.  Denies any nausea, vomiting.  Reports abdominal pain is controlled.  No fevers overnight.  Ambulates frequently in the hall.  Disposition Plan & Communication   Status is: Inpatient  Inpatient status appropriate due to severity of illness and ongoing diagnostic evaluation not appropriate for outpatient setting. Requires close monitoring and IV antibiotics.  May need further procedure/s.  Dispo: The patient is from: home              Anticipated d/c is to: home              Patient currently not medically stable for d/c   Difficult to place patient no   Consults, Procedures, Significant Events   Consultants:   Gastroenterology, Dr. Rush Landmark  Infectious disease  Procedures:   5/5 - ERCP  5/7 - ERCP  5/9 - ERCP with necrosectomy  5/12 ERCP with necrosectomy  Antimicrobials:  Anti-infectives (From admission, onward)   Start     Dose/Rate Route Frequency Ordered Stop   11/19/20 1300  amoxicillin-clavulanate (AUGMENTIN) 875-125 MG per tablet 1 tablet        1 tablet Oral Every 12 hours 11/19/20 0830     11/15/20 1400  meropenem (MERREM) 1 g in sodium chloride 0.9 % 100 mL IVPB  Status:  Discontinued        1 g 200 mL/hr over 30 Minutes Intravenous Every 8 hours 11/15/20 1130 11/19/20 0830   11/15/20 1215  amoxicillin-clavulanate (AUGMENTIN) 875-125 MG per tablet 1 tablet  Status:  Discontinued        1 tablet Oral Every 12 hours 11/15/20 1122 11/15/20 1129   11/11/20 1315   fluconazole (DIFLUCAN) tablet 200 mg  Status:  Discontinued        200 mg Oral Daily 11/11/20 1221 11/11/20 1226   11/11/20 1315  fluconazole (DIFLUCAN) tablet 400 mg        400 mg Oral Daily 11/11/20 1226     11/01/20 2200  piperacillin-tazobactam (ZOSYN) IVPB 3.375 g  Status:  Discontinued        3.375 g 12.5 mL/hr over 240 Minutes Intravenous Every 8 hours 11/01/20 1335 11/01/20 1406   11/01/20 1500  meropenem (MERREM) 1 g in sodium chloride 0.9 % 100 mL IVPB  Status:  Discontinued        1 g 200 mL/hr over 30 Minutes Intravenous Every 8 hours 11/01/20 1413 11/15/20 1122   11/01/20 1430  piperacillin-tazobactam (ZOSYN) IVPB 3.375 g  Status:  Discontinued        3.375 g 100 mL/hr over 30 Minutes Intravenous  Once 11/01/20 1353 11/01/20 1406        Micro    Objective  Vitals:   11/19/20 0319 11/19/20 0807 11/19/20 1142 11/19/20 1629  BP: (!) 146/80 (!) 152/85 120/71 134/69  Pulse: 78 (!) 104 93 100  Resp: 19 20 18 20   Temp: 97.7 F (36.5 C) 99.5 F (37.5 C) 98.5 F (36.9 C) 98.2 F (36.8 C)  TempSrc: Oral Oral Oral Oral  SpO2: 97% 97% 96% 100%  Weight:      Height:        Intake/Output Summary (Last 24 hours) at 11/19/2020 1849 Last data filed at 11/19/2020 1629 Gross per 24 hour  Intake 680 ml  Output 490 ml  Net 190 ml   Filed Weights   11/06/20 0109 11/08/20 0340 11/09/20 0353  Weight: 120.3 kg 118.9 kg 119 kg    Physical Exam:  General exam: Alert, awake, oriented x 3 Respiratory system: Clear to auscultation. Respiratory effort normal. Cardiovascular system:RRR. No murmurs, rubs, gallops. Gastrointestinal system: Abdomen is nondistended, soft and nontender. No organomegaly or masses felt. Normal bowel sounds heard. Central nervous system: Alert and oriented. No focal neurological deficits. Extremities: No C/C/E, +pedal pulses Skin: No rashes, lesions or ulcers Psychiatry: Judgement and insight appear normal. Mood & affect appropriate.     Labs    Data Reviewed: I have personally reviewed following labs and imaging studies  CBC: Recent Labs  Lab 11/15/20 0941 11/16/20 0141 11/17/20 0100 11/18/20 0142 11/19/20 0111  WBC 17.2* 17.5* 15.1* 16.6* 17.7*  HGB 9.3* 9.3* 9.0* 9.4* 9.5*  HCT 29.8* 29.8* 28.1* 30.1* 30.3*  MCV 94.3 94.6 92.7 94.4 95.3  PLT 407* 388 367 421* 195*   Basic Metabolic Panel: Recent Labs  Lab 11/15/20 0941 11/16/20 0141 11/17/20 0100 11/18/20 0142 11/19/20 0111  NA 135 134* 135 137 134*  K 4.5 4.0 3.5 3.4* 3.9  CL 91* 93* 97* 97* 98  CO2 34* 29 29 31 28   GLUCOSE 112* 108* 157* 130* 128*  BUN 13 12 11 10 11   CREATININE 0.93 0.77 0.77 0.74 0.76  CALCIUM 8.3* 8.4* 8.3* 8.5* 8.5*  PHOS 4.7* 4.1 4.6 5.2* 4.2   GFR: Estimated Creatinine Clearance: 143.3 mL/min (by C-G formula based on SCr of 0.76 mg/dL). Liver Function Tests: Recent Labs  Lab 11/13/20 0151 11/14/20 0130 11/15/20 0941 11/16/20 0141 11/17/20 0100 11/18/20 0142 11/19/20 0111  AST 25 19  --   --   --   --   --   ALT 25 21  --   --   --   --   --   ALKPHOS 297* 234*  --   --   --   --   --   BILITOT 1.0 0.8  --   --   --   --   --   PROT 6.0* 6.3*  --   --   --   --   --   ALBUMIN 1.5* 2.1* 2.4* 2.2* 2.4* 2.3* 2.2*   No results for input(s): LIPASE, AMYLASE in the last 168 hours. No results for input(s): AMMONIA in the last 168 hours. Coagulation Profile: No results for input(s): INR, PROTIME in the last 168 hours. Cardiac Enzymes: No results for input(s): CKTOTAL, CKMB, CKMBINDEX, TROPONINI in the last 168 hours. BNP (last 3 results) No results for input(s): PROBNP in the last 8760 hours. HbA1C: No results for input(s): HGBA1C in the last 72 hours. CBG: Recent Labs  Lab 11/13/20 0846 11/13/20 1157 11/13/20 1600  GLUCAP 141* 121* 121*   Lipid Profile: No results for input(s): CHOL, HDL, LDLCALC, TRIG,  CHOLHDL, LDLDIRECT in the last 72 hours. Thyroid Function Tests: No results for input(s): TSH, T4TOTAL, FREET4,  T3FREE, THYROIDAB in the last 72 hours. Anemia Panel: No results for input(s): VITAMINB12, FOLATE, FERRITIN, TIBC, IRON, RETICCTPCT in the last 72 hours. Sepsis Labs: No results for input(s): PROCALCITON, LATICACIDVEN in the last 168 hours.  No results found for this or any previous visit (from the past 240 hour(s)).    Imaging Studies   No results found.   Medications   Scheduled Meds: . amoxicillin-clavulanate  1 tablet Oral Q12H  . feeding supplement  237 mL Oral TID BM  . fluconazole  400 mg Oral Daily  . heparin injection (subcutaneous)  5,000 Units Subcutaneous Q8H  . multivitamin with minerals  1 tablet Oral Daily  . polyethylene glycol  17 g Oral Daily  . risperiDONE  1 mg Oral QHS  . senna-docusate  1 tablet Oral BID  . traZODone  150 mg Oral QHS   Continuous Infusions:      LOS: 18 days    Time spent: 30 minutes with >50% spent at bedside and in coordination of care.     Kathie Dike, MD [facility]  11/19/2020, 6:49 PM      If 7PM-7AM, please contact night-coverage. How to contact the [facility] Attending or Consulting provider [place] or covering provider during after hours [place], for this patient?    1. Check the care team in [facility] and look for a) attending/consulting [facility] provider listed and b) the [facility] team listed 2. Log into [email or web address] and use [place]'s universal password to access. If you do not have the password, please contact the hospital operator. 3. Locate the [facility] provider you are looking for under [facility] and page to a number that you can be directly reached. 4. If you still have difficulty reaching the provider, please page the [facility] (Director on Call) for the Hospitalists listed on amion for assistance.

## 2020-11-19 NOTE — Progress Notes (Signed)
    [facility] for Infectious Disease    Date of Admission:  11/01/2020   Total days of antibiotics 19/meropenem          ID: Steve Romero is a 50 y.o. male with  Principal Problem:   Disorder of bile duct stent Active Problems:   Pancreatic necrosis   Elevated LFTs   Ascending cholangitis   Protein-calorie malnutrition, severe   Bile leak    Subjective: Afebrile, tolerating oral intake without difficulty. Denies nightsweats  ROS: no n/v/fevers/ diarrhea/   Medications:  . amoxicillin-clavulanate  1 tablet Oral Q12H  . feeding supplement  237 mL Oral TID BM  . fluconazole  400 mg Oral Daily  . heparin injection (subcutaneous)  5,000 Units Subcutaneous Q8H  . multivitamin with minerals  1 tablet Oral Daily  . polyethylene glycol  17 g Oral Daily  . risperiDONE  1 mg Oral QHS  . senna-docusate  1 tablet Oral BID  . traZODone  150 mg Oral QHS    Objective: Vital signs in last 24 hours: Temp:  [97.5 F (36.4 C)-99.5 F (37.5 C)] 98.5 F (36.9 C) (05/17 1142) Pulse Rate:  [78-104] 93 (05/17 1142) Resp:  [13-20] 18 (05/17 1142) BP: (120-152)/(71-88) 120/71 (05/17 1142) SpO2:  [95 %-97 %] 96 % (05/17 1142) Physical Exam  Constitutional: He is oriented to person, place, and time. He appears well-developed and well-nourished. No distress.  HENT:  Mouth/Throat: Oropharynx is clear and moist. No oropharyngeal exudate.  Cardiovascular: Normal rate, regular rhythm and normal heart sounds. Exam reveals no gallop and no friction rub.  No murmur heard.  Pulmonary/Chest: Effort normal and breath sounds normal. No respiratory distress. He has no wheezes.  Abdominal: Soft. Bowel sounds are normal. He exhibits no distension. Drain with serous fluid in place Lymphadenopathy:  He has no cervical adenopathy.  Neurological: He is alert and oriented to person, place, and time.  Skin: Skin is warm and dry. No rash noted. No erythema.  Psychiatric: He has a normal mood and affect.  His behavior is normal.     Lab Results Recent Labs    11/18/20 0142 11/19/20 0111  WBC 16.6* 17.7*  HGB 9.4* 9.5*  HCT 30.1* 30.3*  NA 137 134*  K 3.4* 3.9  CL 97* 98  CO2 31 28  BUN 10 11  CREATININE 0.74 0.76   Liver Panel Recent Labs    11/18/20 0142 11/19/20 0111  ALBUMIN 2.3* 2.2*    Microbiology: reviewed Studies/Results: No results found.   Assessment/Plan: Necrotizing pancreatitis s/p ERC, necrosectomy, cystgastrostomy drain placement = will transition to oral amox/clav plus oral fluconazole today.plan to treat for 3 addn weeks through when he has drains replaced  Leukocytosis = not completely convinced it is related to untreated infection. Will check sed rate and crp. Likely from inflammation for pancreatitis. Will continue to monitor  [facility] for Infectious Diseases Cell: [phone] Pager: [phone]  11/19/2020, 1:01 PM

## 2020-11-20 ENCOUNTER — Inpatient Hospital Stay (HOSPITAL_COMMUNITY): Payer: Medicaid Other

## 2020-11-20 DIAGNOSIS — K8309 Other cholangitis: Secondary | ICD-10-CM | POA: Diagnosis not present

## 2020-11-20 LAB — CBC
HCT: 31.3 % — ABNORMAL LOW (ref 39.0–52.0)
Hemoglobin: 10 g/dL — ABNORMAL LOW (ref 13.0–17.0)
MCH: 29.9 pg (ref 26.0–34.0)
MCHC: 31.9 g/dL (ref 30.0–36.0)
MCV: 93.7 fL (ref 80.0–100.0)
Platelets: 447 10*3/uL — ABNORMAL HIGH (ref 150–400)
RBC: 3.34 MIL/uL — ABNORMAL LOW (ref 4.22–5.81)
RDW: 13.8 % (ref 11.5–15.5)
WBC: 20.8 10*3/uL — ABNORMAL HIGH (ref 4.0–10.5)
nRBC: 0 % (ref 0.0–0.2)

## 2020-11-20 MED ORDER — SODIUM CHLORIDE 0.9 % IV SOLN
1.0000 g | Freq: Three times a day (TID) | INTRAVENOUS | Status: DC
  Administered 2020-11-20 – 2020-11-24 (×13): 1 g via INTRAVENOUS
  Filled 2020-11-20 (×15): qty 1

## 2020-11-20 MED ORDER — IOHEXOL 300 MG/ML  SOLN
75.0000 mL | Freq: Once | INTRAMUSCULAR | Status: AC | PRN
  Administered 2020-11-20: 75 mL via INTRAVENOUS

## 2020-11-20 MED ORDER — IOHEXOL 9 MG/ML PO SOLN
ORAL | Status: AC
  Filled 2020-11-20: qty 1000

## 2020-11-20 NOTE — Progress Notes (Signed)
[facility] Progress Note  Patient: Steve Romero    XVQ:[id number]  DOA: 11/01/2020     Date of Service: the patient was seen and examined on 11/20/2020  Brief hospital course: Past medical history of COPD, bipolar disorder, schizoaffective disorder, depression.  Presents with complaints of abdominal pain found to have acute pancreatitis with necrosis.  GI following. Currently plan is further work-up for worsening leukocytosis. Recent History: 10/09/20 cholecystectomy complicated by postop bile leak due to torn cystic duct that was clipped.  JP drain placed. 10/10/2020 ERCP with sphincterotomy and placement of plastic stent.  Developed post ERCP pancreatitis. Plan was for repeat ERCP and likely stent removal in 6 weeks.   Discharged after 4/6 - 4/13 admission.  Readmitted 4/29 from surgeons's office. Pt has developed large, complex fluid collection at the pancreatic head, uncinate, proximal body. 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 The necrotic collection is causing mass-effect on the gastric antrum/pylorus/proximal duodenum with reactive small bowel thickening. Stentin stable position. Intrapelvic bile duct dilation is mild to moderate likely due to mass-effect from the fluid collection. There is fluid edema surrounding the pancreas extending into the retroperitoneum and mesentery. Reactive thickening in the colon.   Significant Events:  5/5 ERCP / EUS - see Op Note findings.    5/6 LFT's improved, jaundice resolved 5/7 ERCP - see op note. Prior stent accessible now and new one placed. Partial necrosectomy 5/9 ERCP - necrosectomy and replacement of stent 5/12 ERCP with necrosectomy  Assessment and Plan: Bile leak status post cholecystectomy 4/6 with JP drain placement -  Post-ERCP Pancreatitis with necrotic fluid collection 4/7 JP drain in place and will be followed up by Dr. Constance Haw after discharge. Initially admitted at [facility]. General surgery there Dr. Constance Haw d/w Dr. Rush Landmark who advised transfer to [facility] for possible advanced ERCP. --GI following, see their recs --He has had multiple ERCP with necrosectomy -- Currently on soft diet -- He was treated with a prolonged course of meropenem, but has since been transitioned to Augmentin --Follow CBC --RD consult for supplement recs --Pain control, antiemetics PRN --cultures from pancreatic cyst fluid shows moderate candida albicans --continue on diflucan --appreciate ID assistance --plan is to continue on augmentin and diflucan for an additional 3 weeks until he can follow up with GI to have repeat ERCP and possible stent removal --will likely have repeat CT in the next few weeks  Leukocytosis  trending up over the last 24 hours.  Bilateral lower extremity edema  --progressive over past several days.  Good response to trial of IV Lasix 5/8. --Continue IV Lasix 40 mg to BID  --Monitor renal function and electrolytes --Duplex U/S LE's negative for DVT --D-dimer elevated, but suspect that is related to pancreatitis --Echo last month poor study, EF >75%, no mention of diastolic findings - Suspect hypoalbuminemia from poor po intake contributing to anasarca --Started on albumin infusions with lasix - urine output has been fair -overall LE swelling is markedly improved. Will hold off on further diuretics for now  COPD --stable, without no exacerbation sx's.   --Duonebs PRN  Schizoaffective disorder, Depression  --stable. --resume Risperdal, trazodone  Obesity:  Body mass index is 38.19 kg/m.  Complicates overall care and prognosis.  Recommend lifestyle modifications including physical activity and diet for weight loss and overall long-term health.  Severe protein calorie malnutrition. Placing the patient at high risk of poor outcome. Body mass index is 38.19 kg/m.  Nutrition Problem: Severe Malnutrition Etiology: acute  illness Interventions: Interventions: Ensure Enlive (each supplement provides 350kcal and 20 grams of protein),Magic cup,MVI  Diet: Soft diet DVT Prophylaxis:   heparin injection 5,000 Units Start: 11/15/20 1400   Advance goals of care discussion: Full code  Family Communication: no family was present at bedside, at the time of interview.   Disposition:  Status is: Inpatient  Remains inpatient appropriate because:Ongoing active pain requiring inpatient pain management and Ongoing diagnostic testing needed not appropriate for outpatient work up  Dispo: The patient is from: Home              Anticipated d/c is to: Home              Patient currently is not medically stable to d/c.   Difficult to place patient No  Subjective: Continues to have some abdominal pain.  No nausea no vomiting.  Passing gas 2 hours to have a bowel movement.  No bleeding.  No shortness of breath.  Physical Exam:  General: Appear in mild distress, no Rash; Oral Mucosa Clear, moist. no Abnormal Neck Mass Or lumps, Conjunctiva normal  Cardiovascular: S1 and S2 Present, no Murmur, Respiratory: good respiratory effort, Bilateral Air entry present and CTA, no Crackles, no wheezes Abdomen: Bowel Sound present, Soft and, distended, mild tenderness Extremities: no Pedal edema Neurology: alert and oriented to time, place, and person affect appropriate. no new focal deficit Gait not checked due to patient safety concerns  Vitals:   11/20/20 0340 11/20/20 0822 11/20/20 1121 11/20/20 1534  BP: 119/71 128/75 121/77 124/72  Pulse: 100 (!) 104 98 99  Resp: 20 18 17 18   Temp: (!) 100.4 F (38 C) 98.6 F (37 C) 99.8 F (37.7 C) 99.7 F (37.6 C)  TempSrc: Oral Oral Oral Oral  SpO2: 95% 95% 96% 96%  Weight:      Height:        Intake/Output Summary (Last 24 hours) at 11/20/2020 2042 Last data filed at 11/20/2020 1700 Gross per 24 hour  Intake 580 ml  Output 290 ml  Net 290 ml   Filed Weights   11/06/20 0109  11/08/20 0340 11/09/20 0353  Weight: 120.3 kg 118.9 kg 119 kg    Data Reviewed: I have personally reviewed and interpreted daily labs, tele strips, imaging. I reviewed all nursing notes, pharmacy notes, vitals, pertinent old records I have discussed plan of care as described above with RN and patient/family.  CBC: Recent Labs  Lab 11/16/20 0141 11/17/20 0100 11/18/20 0142 11/19/20 0111 11/20/20 0239  WBC 17.5* 15.1* 16.6* 17.7* 20.8*  HGB 9.3* 9.0* 9.4* 9.5* 10.0*  HCT 29.8* 28.1* 30.1* 30.3* 31.3*  MCV 94.6 92.7 94.4 95.3 93.7  PLT 388 367 421* 415* 098*   Basic Metabolic Panel: Recent Labs  Lab 11/15/20 0941 11/16/20 0141 11/17/20 0100 11/18/20 0142 11/19/20 0111  NA 135 134* 135 137 134*  K 4.5 4.0 3.5 3.4* 3.9  CL 91* 93* 97* 97* 98  CO2 34* 29 29 31 28   GLUCOSE 112* 108* 157* 130* 128*  BUN 13 12 11 10 11   CREATININE 0.93 0.77 0.77 0.74 0.76  CALCIUM 8.3* 8.4* 8.3* 8.5* 8.5*  PHOS 4.7* 4.1 4.6 5.2* 4.2    Studies: No results found.  Scheduled Meds: . feeding supplement  237 mL Oral TID BM  . fluconazole  400 mg Oral Daily  . heparin injection (subcutaneous)  5,000 Units Subcutaneous Q8H  . multivitamin with minerals  1 tablet Oral Daily  . polyethylene glycol  17 g Oral Daily  . risperiDONE  1 mg Oral QHS  . senna-docusate  1 tablet Oral BID  . traZODone  150 mg Oral QHS   Continuous Infusions: . meropenem (MERREM) IV 1 g (11/20/20 1532)   PRN Meds: acetaminophen **OR** acetaminophen, bisacodyl, diphenhydrAMINE **OR** diphenhydrAMINE, Gerhardt's butt cream, ipratropium-albuterol, morphine injection, ondansetron **OR** ondansetron (ZOFRAN) IV, oxyCODONE  Time spent: 35 minutes  Author: Berle Mull, MD [facility] 11/20/2020 8:42 PM  To reach On-call, see care teams to locate the attending and reach out via [email or web address]. Between 7PM-7AM, please contact night-coverage If you still have difficulty reaching the attending provider, please page  the [facility] (Director on Call) for [facility] on amion for assistance.

## 2020-11-20 NOTE — Progress Notes (Signed)
Nutrition Follow Up  DOCUMENTATION CODES:    Severe malnutrition in context of acute illness/injury  INTERVENTION:    Ensure Enlive po TID, each supplement provides 350 kcal and 20 grams of protein  MVI daily   NUTRITION DIAGNOSIS:   Severe Malnutrition related to acute illness as evidenced by energy intake < or equal to 50% for > or equal to 5 days,percent weight loss,moderate fat depletion.  Ongoing  GOAL:   Patient will meet greater than or equal to 90% of their needs   Meeting   MONITOR:   Diet advancement,Labs,Weight trends,Skin,I & O's  REASON FOR ASSESSMENT:   Consult Assessment of nutrition requirement/status  ASSESSMENT:  50 yo male with a PMH of OSA, bipolar disorder, depression/anxiety, schizoaffective disorder, HLD, HTN, and unspecified thyroid disorder who presents from [facility] (admitted 4/29) with intra-abdominal infection/necrotizing pancreatitis and transferred to [facility] on 5/3 for advanced ERCP intervention.   4/6 - cholecystectomy complicated by postop bile leak due to torn cystic duct that was clipped - JP drain placed 4/7 - ERCP with sphincterotomy and placement of plastic stent - developed post ERCP pancreatitis 4/27 - Bile leak following messy cholecystectomy  5/05 - s/p upper EUS showed pancreatic pseudocyst s/p axials stenting, partial necrosectomy  5/07 - s/p ERCP/EGD, s/p repeat necrosectomy 5/10 - diet advanced to SOFT  5/12 - s/p repeat necrosectomy   Intake remains stable. Last eight meal completions charted as 75-100%. Taking Ensure 2-3 times daily. Patient requesting for diet to be advanced to regular consistency. Okay with GI. Continue current interventions.   Admission weight: 120.3 kg No recent weight obtained.   JP drain: 385 ml x 24 hrs   Medications: miralax, senokot Labs: Na 134 (L)   Diet Order            Diet regular Room service appropriate? Yes; Fluid consistency: Thin  Diet effective now                EDUCATION NEEDS:   Education needs have been addressed  Skin:  Skin Assessment: Skin Integrity Issues: Skin Integrity Issues:: Incisions,Other (Comment) Incisions: Abdomen, closed  Last BM:  5/17  Height:  Ht Readings from Last 1 Encounters:  11/07/20 5' 9.5" (1.765 m)   Weight:  Wt Readings from Last 1 Encounters:  11/09/20 119 kg   Ideal Body Weight:  74.1 kg  BMI:  Body mass index is 38.19 kg/m.  Estimated Nutritional Needs:   Kcal:  2200-2400 kcal   Protein:  110-125 grams   Fluid:  >2 L  Mariana Single RD, LDN Clinical Nutrition Pager listed in [place]

## 2020-11-20 NOTE — Progress Notes (Signed)
    [place] for Infectious Disease    Date of Admission:  11/01/2020     ID: Steve Romero is a 50 y.o. male with necrotizing pancreatitis s/p ERCP/cystgastrostomy/stent replacement/necrosectomy Principal Problem:   Disorder of bile duct stent Active Problems:   Pancreatic necrosis   Elevated LFTs   Ascending cholangitis   Protein-calorie malnutrition, severe   Bile leak    Subjective: Afebrile, feeling well, denies abdominal pain  Labs reveal still climbing WBC up to 20K.  Medications:  . amoxicillin-clavulanate  1 tablet Oral Q12H  . feeding supplement  237 mL Oral TID BM  . fluconazole  400 mg Oral Daily  . heparin injection (subcutaneous)  5,000 Units Subcutaneous Q8H  . multivitamin with minerals  1 tablet Oral Daily  . polyethylene glycol  17 g Oral Daily  . risperiDONE  1 mg Oral QHS  . senna-docusate  1 tablet Oral BID  . traZODone  150 mg Oral QHS    Objective: Vital signs in last 24 hours: Temp:  [98.2 F (36.8 C)-100.4 F (38 C)] 100.4 F (38 C) (05/18 0340) Pulse Rate:  [93-100] 100 (05/18 0340) Resp:  [16-20] 20 (05/18 0340) BP: (119-134)/(69-74) 119/71 (05/18 0340) SpO2:  [94 %-100 %] 95 % (05/18 0340) Physical Exam  Constitutional: He is oriented to person, place, and time. He appears well-developed and well-nourished. No distress.  HENT:  Mouth/Throat: Oropharynx is clear and moist. No oropharyngeal exudate. edentulous Cardiovascular: Normal rate, regular rhythm and normal heart sounds. Exam reveals no gallop and no friction rub.  No murmur heard.  Pulmonary/Chest: Effort normal and breath sounds normal. No respiratory distress. He has no wheezes.  Abdominal: Soft. Bowel sounds are normal. He exhibits no distension. There is no tenderness.  Lymphadenopathy:  He has no cervical adenopathy.  Neurological: He is alert and oriented to person, place, and time.  Skin: Skin is warm and dry. No rash noted. No erythema.  Psychiatric: He has a  normal mood and affect. His behavior is normal.    Lab Results Recent Labs    11/18/20 0142 11/19/20 0111 11/20/20 0239  WBC 16.6* 17.7* 20.8*  HGB 9.4* 9.5* 10.0*  HCT 30.1* 30.3* 31.3*  NA 137 134*  --   K 3.4* 3.9  --   CL 97* 98  --   CO2 31 28  --   BUN 10 11  --   CREATININE 0.74 0.76  --    Liver Panel Recent Labs    11/18/20 0142 11/19/20 0111  ALBUMIN 2.3* 2.2*   Sedimentation Rate Recent Labs    11/19/20 1323  ESRSEDRATE 44*   C-Reactive Protein Recent Labs    11/19/20 1323  CRP 22.2*    Microbiology: reviewed Studies/Results: No results found.   ASSESSMENT AND PLAN: Necrotizing pancreatitis with infected pseudocyst = appears to have tolerated 2 days of switch to amox/clav plus oral fluconazole. Will change back to meropenem. However, his WBC was increasing while on iv abtx. Recommend to discuss with GI next steps, if repeat imaging would make sense. Possibly better characterization of previous fluid collection that would be amenable to drainage.   Discussed with dr patel.  [facility] for Infectious Diseases Cell: [phone] Pager: [phone]  11/20/2020, 8:22 AM

## 2020-11-20 NOTE — Progress Notes (Signed)
[facility]  Patient's sister Jacqlyn Larsen called my office wanting an update and clarification. Updated her on what I knew and discussed with Dr. Posey Pronto. She knows more updates are to come regarding plans.  Happy to assist in anyway I can for outpatient care, communication, etc as patient moves forward.  Curlene Labrum, MD [facility] [address] [place], [place] [address] [phone] (office)

## 2020-11-20 NOTE — Progress Notes (Signed)
Daily Rounding Note  11/20/2020, 12:25 PM  LOS: 19 days   SUBJECTIVE:   Chief complaint: Complicated pancreatitis.  Status post necrosectomies.  Percutaneous biliary drain in place.     Patient continues to feel well.  Lingering, stable 3/10 pain over in the right upper quadrant.  No nausea or vomiting.  Eating very well.  Bowel movements every other day.  Perc biliary drain output for the last 5 days was 255, 260, 125, 230, 385 mL.  80 mL so far today. Rising WBCs and low grade fever this AM.    OBJECTIVE:         Vital signs in last 24 hours:    Temp:  [98.2 F (36.8 C)-100.4 F (38 C)] 99.8 F (37.7 C) (05/18 1121) Pulse Rate:  [98-104] 98 (05/18 1121) Resp:  [16-20] 17 (05/18 1121) BP: (119-134)/(69-77) 121/77 (05/18 1121) SpO2:  [94 %-100 %] 96 % (05/18 1121) Last BM Date: 11/19/20 Filed Weights   11/06/20 0109 11/08/20 0340 11/09/20 0353  Weight: 120.3 kg 118.9 kg 119 kg   General: Looks slightly ill but comfortable.  Alert.  Certainly not toxic appearing. Heart: RRR. Chest: Clear bilaterally.  No labored breathing or cough Abdomen: Soft without tenderness.  Obese.  Active bowel sounds.  Well bandaged and covered percutaneous drain site.  The JP drain itself contains clear, yellow-tinged fluid Extremities: Lower extremity edema, overall improved from more than a week ago. Neuro/Psych: Pleasant, cooperative.  No gross deficits.  Affect flat as always..  Intake/Output from previous day: 05/17 0701 - 05/18 0700 In: 480 [P.O.:480] Out: 385 [Drains:385]  Intake/Output this shift: Total I/O In: -  Out: 80 [Drains:80]  Lab Results: Recent Labs    11/18/20 0142 11/19/20 0111 11/20/20 0239  WBC 16.6* 17.7* 20.8*  HGB 9.4* 9.5* 10.0*  HCT 30.1* 30.3* 31.3*  PLT 421* 415* 447*   BMET Recent Labs    11/18/20 0142 11/19/20 0111  NA 137 134*  K 3.4* 3.9  CL 97* 98  CO2 31 28  GLUCOSE 130* 128*  BUN  10 11  CREATININE 0.74 0.76  CALCIUM 8.5* 8.5*   LFT Recent Labs    11/18/20 0142 11/19/20 0111  ALBUMIN 2.3* 2.2*   PT/INR No results for input(s): LABPROT, INR in the last 72 hours. Hepatitis Panel No results for input(s): HEPBSAG, HCVAB, HEPAIGM, HEPBIGM in the last 72 hours.  Studies/Results: No results found.   Scheduled Meds: . feeding supplement  237 mL Oral TID BM  . fluconazole  400 mg Oral Daily  . heparin injection (subcutaneous)  5,000 Units Subcutaneous Q8H  . multivitamin with minerals  1 tablet Oral Daily  . polyethylene glycol  17 g Oral Daily  . risperiDONE  1 mg Oral QHS  . senna-docusate  1 tablet Oral BID  . traZODone  150 mg Oral QHS   Continuous Infusions: . meropenem (MERREM) IV     PRN Meds:.acetaminophen **OR** acetaminophen, bisacodyl, diphenhydrAMINE **OR** diphenhydrAMINE, Gerhardt's butt cream, ipratropium-albuterol, morphine injection, ondansetron **OR** ondansetron (ZOFRAN) IV, oxyCODONE   ASSESMENT:   *Post ERCP pancreatitis with pseudocyst/necrosis. S/p ERCP, cyst gastrostomy, stent placement, necrosectomy x 3.Original biliary stent removed/replacedand a second stent was placed as of 5/9,double-pigtail stents replaced as of latest ERCP 5/12. Clinically much improved for over a week. ID, Dr. Graylon Good switched him over from meropenem/Flagyl/Diflucan to Augmentin, p.o. Flagyl starting 5/17 but WBCs have gone from 17.7 >> 20.8 over 24 hours.  Once  again had temp of 100.4 early this morning.  Thus now switched back to meropenem as of this morning   *Post cholecystectomy bile leak leading to ERCP and biliary stent placement.  Percutaneous biliary drain in place.  *Severe malnutrition.  Appetite has improved dramatically following the necrosectomy's and RD has calculated he is meeting greater than or equal to 90% of his caloric needs.  Recommends continuing Ensure Enlive tid in addition to soft diet.  *Stable, normocytic  anemia.  *Moderate esophagitis on 11/11/2020 EGD.     PLAN   *   Per Dr Rush Landmark message this AM: "I think from a necrosectomy perspective he has had everything done that we can offer at this time. The transverse mesocolon fluid collection which is not accessible via EUS could be another reason for the leukocytosis. Other thing to consider would be the persistence of his biliary leak. I saw that the output has decreased today but looks like it still been significant over the course of the last few days. There may need to be consideration of fully covered stent placement/stent exchange if the leakage remains high in volume"  *   ?  Need to repeat CTAP?  *   Once we can get him discharged, he has a follow-up appointment in the office with Dr. Rush Landmark on June 21 and EGD set up at [facility] End[facility] unit on 6/23.  Additionally a CT abdomen pelvis will be coordinated prior to his office visit.    [name]  11/20/2020, 12:25 PM Phone [phone]

## 2020-11-21 ENCOUNTER — Inpatient Hospital Stay: Payer: Self-pay

## 2020-11-21 DIAGNOSIS — T859XXD Unspecified complication of internal prosthetic device, implant and graft, subsequent encounter: Secondary | ICD-10-CM | POA: Diagnosis not present

## 2020-11-21 DIAGNOSIS — K8309 Other cholangitis: Secondary | ICD-10-CM | POA: Diagnosis not present

## 2020-11-21 LAB — COMPREHENSIVE METABOLIC PANEL
ALT: 30 U/L (ref 0–44)
AST: 30 U/L (ref 15–41)
Albumin: 2.1 g/dL — ABNORMAL LOW (ref 3.5–5.0)
Alkaline Phosphatase: 173 U/L — ABNORMAL HIGH (ref 38–126)
Anion gap: 10 (ref 5–15)
BUN: 8 mg/dL (ref 6–20)
CO2: 28 mmol/L (ref 22–32)
Calcium: 8.5 mg/dL — ABNORMAL LOW (ref 8.9–10.3)
Chloride: 94 mmol/L — ABNORMAL LOW (ref 98–111)
Creatinine, Ser: 0.73 mg/dL (ref 0.61–1.24)
GFR, Estimated: >60 mL/min (ref >60)
Glucose, Bld: 125 mg/dL — ABNORMAL HIGH (ref 70–99)
Potassium: 4.4 mmol/L (ref 3.5–5.1)
Sodium: 132 mmol/L — ABNORMAL LOW (ref 135–145)
Total Bilirubin: 0.8 mg/dL (ref 0.3–1.2)
Total Protein: 6.7 g/dL (ref 6.5–8.1)

## 2020-11-21 LAB — CBC WITH DIFFERENTIAL/PLATELET
Abs Immature Granulocytes: 0.18 10*3/uL — ABNORMAL HIGH (ref 0.00–0.07)
Basophils Absolute: 0.1 10*3/uL (ref 0.0–0.1)
Basophils Relative: 0 %
Eosinophils Absolute: 0.2 10*3/uL (ref 0.0–0.5)
Eosinophils Relative: 1 %
HCT: 29.7 % — ABNORMAL LOW (ref 39.0–52.0)
Hemoglobin: 9.6 g/dL — ABNORMAL LOW (ref 13.0–17.0)
Immature Granulocytes: 1 %
Lymphocytes Relative: 10 %
Lymphs Abs: 1.7 10*3/uL (ref 0.7–4.0)
MCH: 29.6 pg (ref 26.0–34.0)
MCHC: 32.3 g/dL (ref 30.0–36.0)
MCV: 91.7 fL (ref 80.0–100.0)
Monocytes Absolute: 1.1 10*3/uL — ABNORMAL HIGH (ref 0.1–1.0)
Monocytes Relative: 7 %
Neutro Abs: 13.8 10*3/uL — ABNORMAL HIGH (ref 1.7–7.7)
Neutrophils Relative %: 81 %
Platelets: 437 10*3/uL — ABNORMAL HIGH (ref 150–400)
RBC: 3.24 MIL/uL — ABNORMAL LOW (ref 4.22–5.81)
RDW: 13.6 % (ref 11.5–15.5)
WBC: 17 10*3/uL — ABNORMAL HIGH (ref 4.0–10.5)
nRBC: 0 % (ref 0.0–0.2)

## 2020-11-21 LAB — MAGNESIUM: Magnesium: 2 mg/dL (ref 1.7–2.4)

## 2020-11-21 NOTE — Progress Notes (Signed)
Interventional Radiology Progress Note  [facility] contacted to discuss CT scan result . Possible drainage.   Discussed with Dr. Tarri Glenn.  Mesocolic phelgmon seems ill formed on the recent CT.  Low confidence of technical success with drainage at this time, with likely low yield from clinical standpoint with attempt now.    Possible rescan in 24-48 hours to re-assess for possible drainage.    Please call with questions/concerns.   Signed,  Dulcy Fanny. Earleen Newport, DO

## 2020-11-21 NOTE — Progress Notes (Signed)
[place] for Infectious Disease    Date of Admission:  11/01/2020   Total days of antibiotics 14           ID: Steve Romero is a 50 y.o. male with  Necrotizing pancreatitis with infected pseudocyst s/p cystgastrostomy and necrosectomy Principal Problem:   Disorder of bile duct stent Active Problems:   Pancreatic necrosis   Elevated LFTs   Ascending cholangitis   Protein-calorie malnutrition, severe   Bile leak    Subjective: Afebrile. Underwent repeat abd CT showing worsening fluid collection about transverse mesocolon and about pancreas  Labs: WBC trending down since transition back to meropenem  Medications:  . feeding supplement  237 mL Oral TID BM  . fluconazole  400 mg Oral Daily  . heparin injection (subcutaneous)  5,000 Units Subcutaneous Q8H  . multivitamin with minerals  1 tablet Oral Daily  . polyethylene glycol  17 g Oral Daily  . risperiDONE  1 mg Oral QHS  . senna-docusate  1 tablet Oral BID  . traZODone  150 mg Oral QHS    Objective: Vital signs in last 24 hours: Temp:  [98 F (36.7 C)-99.7 F (37.6 C)] 98.9 F (37.2 C) (05/19 1114) Pulse Rate:  [75-110] 110 (05/19 1114) Resp:  [16-20] 18 (05/19 1114) BP: (119-144)/(71-80) 130/77 (05/19 1114) SpO2:  [96 %-100 %] 100 % (05/19 1114) Physical Exam  Constitutional: He is oriented to person, place, and time. He appears well-developed and well-nourished. No distress.  HENT: edentulous Mouth/Throat: Oropharynx is clear and moist. No oropharyngeal exudate.  Cardiovascular: Normal rate, regular rhythm and normal heart sounds. Exam reveals no gallop and no friction rub.  No murmur heard.  Pulmonary/Chest: Effort normal and breath sounds normal. No respiratory distress. He has no wheezes.  Abdominal: Soft. Bowel sounds are normal. He exhibits no distension. There is no tenderness.  Lymphadenopathy:  He has no cervical adenopathy.  Neurological: He is alert and oriented to person, place, and time.   Skin: Skin is warm and dry. No rash noted. No erythema.  Psychiatric: He has a normal mood and affect. His behavior is normal.     Lab Results Recent Labs    11/19/20 0111 11/20/20 0239 11/21/20 0502  WBC 17.7* 20.8* 17.0*  HGB 9.5* 10.0* 9.6*  HCT 30.3* 31.3* 29.7*  NA 134*  --  132*  K 3.9  --  4.4  CL 98  --  94*  CO2 28  --  28  BUN 11  --  8  CREATININE 0.76  --  0.73   Liver Panel Recent Labs    11/19/20 0111 11/21/20 0502  PROT  --  6.7  ALBUMIN 2.2* 2.1*  AST  --  30  ALT  --  30  ALKPHOS  --  173*  BILITOT  --  0.8   Sedimentation Rate Recent Labs    11/19/20 1323  ESRSEDRATE 44*   C-Reactive Protein Recent Labs    11/19/20 1323  CRP 22.2*    Microbiology: reviewed Studies/Results: CT ABDOMEN PELVIS W CONTRAST  Result Date: 11/20/2020 CLINICAL DATA:  Abdominal pain, fever, known pancreatitis EXAM: CT ABDOMEN AND PELVIS WITH CONTRAST TECHNIQUE: Multidetector CT imaging of the abdomen and pelvis was performed using the standard protocol following bolus administration of intravenous contrast. CONTRAST:  30mL OMNIPAQUE IOHEXOL 300 MG/ML  SOLN COMPARISON:  CT 11/13/2020 FINDINGS: Lower chest: Bandlike subsegmental atelectasis or scarring in the lung bases. Normal heart size. No pericardial effusion. Hepatobiliary: No concerning  focal liver lesion. Smooth surface contour. Normal hepatic attenuation. Prior cholecystectomy. Surgical drain remains in place terminating at the gallbladder fossa, unchanged position from comparison exam. Persistent intra and extrahepatic biliary ductal dilatation despite the presence of paired common bile duct stents terminating in the extrahepatic common bile duct proximally and duodenum distally. These traverse the vicinity of a complex pancreatic collection further described below. No visible intraductal gallstones. Pancreas: Irregular, patchy enhancement of the pancreas, particularly with hypoattenuation involving the pancreatic  head, uncinate and proximal body. Extensive heterogeneous and edematous changes throughout the pancreatic parenchyma with extensive surrounding peripancreatic fluid and phlegmon. A more focal air and fluid containing collection is seen at the level of the pancreatic head and uncinate measuring 4.9 x 3.6 by 5.8 cm in size appearing to extend along the pancreatic duct. There is evidence of of surgical gastrocystostomy stent between the gastric wall and collection along the pancreatic head and proximal body which is traversed by a small pigtail catheter terminating approximately in the gastric lumen and distally within the lateral portion of this persisting collection. Additional adjacent ill-defined pancreatic phlegmon versus collection measuring approximately 9 x 9.4 cm (3/40) not significantly changed from comparison when measured at similar levels. This extends along portion of the transverse colon likely with some reactive thickening. Additional extensive inflammatory phlegmon centered in the upper mesentery and retroperitoneum. No definite new collection or abscess is seen. Spleen: Normal in size. No concerning splenic lesions. Adrenals/Urinary Tract: No concerning adrenal nodules. Kidneys are normally located with symmetric enhancement and excretion. No suspicious renal lesion, urolithiasis or hydronephrosis. Urinary bladder is unremarkable for the degree of distention. Stomach/Bowel: Distal esophagus unremarkable. Cysto gastrostomy noted, as detailed above. Extensive circumferential thickening of the duodenum, much of which is favored to be reactive. Several loops of small bowel and portion of the transverse colon adjacent the heterogeneous peripancreatic phlegmon and fluid collections also likely reactive. No resulting bowel obstruction is seen at this time. The appendix is not well visualized. No extravasation of high attenuation enteric contrast media. Vascular/Lymphatic: No significant vascular findings are  present. Suboptimal opacification of the portal and hepatic veins to assess for patency. Clustered reactive appearing upper mesenteric nodes are again seen (3/50-55). No new enlarged or enlarging lymph nodes are present. Reproductive: The prostate and seminal vesicles are unremarkable. Other: Edematous changes of the umbilicus. No bowel containing hernia. Fat containing right inguinal hernia. No bowel containing hernia. Mild posterior body wall edema. Musculoskeletal: Multilevel degenerative changes are present in the imaged portions of the spine. Features most pronounced L3-4. Bilateral L3 pars defects noted as well. Mild degenerative changes in hips and pelvis. IMPRESSION: Redemonstrated features of persistent severe necrotizing pancreatitis with extensive surrounding peripancreatic fluid and phlegmon as well as a more central region walled off necrosis which appears partially decompressed by a cystogastrostomy which is traversed by a small pigtail drainage catheter though this is notably along the far peripheral aspect of the pancreatic collection and may not adequately drain this collection given complexity and persistence from comparison imaging. More ill-defined phlegmon noted throughout the mid mesentery and retroperitoneum resulting in what is likely reactive thickening across multiple loops of small bowel, transverse colon and much of the duodenal sweep. Persistent biliary ductal dilatation despite the presence of 2 common bile duct stents which are positioned proximally in the extrahepatic common bile duct and distally within the duodenal lumen. Postsurgical changes from cholecystectomy with surgical drain at the level of the gallbladder fossa, unchanged prior. Electronically Signed   By: March Rummage  [facility] M.D.   On: 11/20/2020 22:23     Assessment/Plan: Necrotizing pancreatitis with infected pseudocyst and new fluid collection = appreciate dr Rush Landmark and GI team for further management. IR review -  recommend repeat imaging in 48-72hr to see if fluid collection about tranverse colon is more organized for possible drainage. Defer to Dr Jerilynn Mages for need to PD stent  From ID standpoint. Will get picc line for long term abtx for meropenem and continue with oral fluconazole  [facility] for Infectious Diseases Cell: [phone] Pager: [phone]  11/21/2020, 3:22 PM

## 2020-11-21 NOTE — Progress Notes (Signed)
[facility] Progress Note  Patient: Steve Romero    ZOX:[id number]  DOA: 11/01/2020     Date of Service: the patient was seen and examined on 11/21/2020  Brief hospital course: Past medical history of COPD, bipolar disorder, schizoaffective disorder, depression.  Presents with complaints of abdominal pain found to have acute pancreatitis with necrosis.  GI following. Currently plan is further work-up for worsening leukocytosis. Recent History: 10/09/20 cholecystectomy complicated by postop bile leak due to torn cystic duct that was clipped.  JP drain placed. 10/10/2020 ERCP with sphincterotomy and placement of plastic stent.  Developed post ERCP pancreatitis. Plan was for repeat ERCP and likely stent removal in 6 weeks.   Discharged after 4/6 - 4/13 admission.  Readmitted 4/29 from surgeons's office. Pt has developed large, complex fluid collection at the pancreatic head, uncinate, proximal body. 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 The necrotic collection is causing mass-effect on the gastric antrum/pylorus/proximal duodenum with reactive small bowel thickening. Stentin stable position. Intrapelvic bile duct dilation is mild to moderate likely due to mass-effect from the fluid collection. There is fluid edema surrounding the pancreas extending into the retroperitoneum and mesentery. Reactive thickening in the colon.   Significant Events:  5/5 ERCP / EUS - see Op Note findings.    5/6 LFT's improved, jaundice resolved 5/7 ERCP - see op note. Prior stent accessible now and new one placed. Partial necrosectomy 5/9 ERCP - necrosectomy and replacement of stent 5/12 ERCP with necrosectomy  Assessment and Plan: Bile leak status post cholecystectomy 4/6 with JP drain placement -  Post-ERCP Pancreatitis with necrotic fluid collection 4/7 JP drain in place and will be followed up by Dr. Constance Haw after discharge. Initially admitted at [facility]. General surgery there Dr. Constance Haw d/w Dr. Rush Landmark who advised transfer to [facility] for possible advanced ERCP. --GI following, see their recs --He has had multiple ERCP with necrosectomy -- Currently on soft diet -- He was treated with a prolonged course of meropenem, but has since been transitioned to Augmentin --Follow CBC --RD consult for supplement recs --Pain control, antiemetics PRN --cultures from pancreatic cyst fluid shows moderate candida albicans --continue on diflucan --appreciate ID assistance --plan is to continue on augmentin and diflucan for an additional 3 weeks until he can follow up with GI to have repeat ERCP and possible stent removal -Repeat CT scan on 5/18 continues to show a phlegmon.  GI recommended IR evaluation but currently IR recommending repeat CT in 48 hours to ensure that the phlegmon organizes.  Per ID patient will be discharged on meropenem.  Leukocytosis  trending up over the last 24 hours.  Bilateral lower extremity edema  --progressive over past several days.  Good response to trial of IV Lasix 5/8. --Continue IV Lasix 40 mg to BID  --Monitor renal function and electrolytes --Duplex U/S LE's negative for DVT --D-dimer elevated, but suspect that is related to pancreatitis --Echo last month poor study, EF >75%, no mention of diastolic findings - Suspect hypoalbuminemia from poor po intake contributing to anasarca --Started on albumin infusions with lasix - urine output has been fair -overall LE swelling is markedly improved. Will hold off on further diuretics for now  COPD --stable, without no exacerbation sx's.   --Duonebs PRN  Schizoaffective disorder, Depression  --stable. --resume Risperdal, trazodone  Obesity:  Body mass index is 38.19 kg/m.  Complicates overall care and prognosis.  Recommend lifestyle modifications including physical activity and diet for weight loss and overall long-term  health.  Severe protein  calorie malnutrition. Placing the patient at high risk of poor outcome. Body mass index is 38.19 kg/m.  Nutrition Problem: Severe Malnutrition Etiology: acute illness Interventions: Interventions: Ensure Enlive (each supplement provides 350kcal and 20 grams of protein),Magic cup,MVI  Diet: Soft diet DVT Prophylaxis:   heparin injection 5,000 Units Start: 11/15/20 1400   Advance goals of care discussion: Full code  Family Communication: sister was present at bedside, at the time of interview.  Answer questions satisfactorily.  Disposition:  Status is: Inpatient  Remains inpatient appropriate because:Ongoing active pain requiring inpatient pain management and Ongoing diagnostic testing needed not appropriate for outpatient work up  Dispo: The patient is from: Home              Anticipated d/c is to: Home              Patient currently is not medically stable to d/c.   Difficult to place patient No  Subjective: Mild pain.  No nausea no vomiting no fever no chills.  Physical Exam:  General: Appear in mild distress, no Rash; Oral Mucosa Clear, moist. no Abnormal Neck Mass Or lumps, Conjunctiva normal  Cardiovascular: S1 and S2 Present, no Murmur, Respiratory: good respiratory effort, Bilateral Air entry present and CTA, no Crackles, no wheezes Abdomen: Bowel Sound present, Soft and distended, mild tenderness Extremities: Trace pedal edema Neurology: alert and oriented to time, place, and person affect appropriate. no new focal deficit Gait not checked due to patient safety concerns  Vitals:   11/21/20 0750 11/21/20 0752 11/21/20 1114 11/21/20 1646  BP: 119/73 119/73 130/77 125/71  Pulse: (!) 110 (!) 110 (!) 110 100  Resp: 20 20 18 20   Temp: 98 F (36.7 C) 98 F (36.7 C) 98.9 F (37.2 C) 99.8 F (37.7 C)  TempSrc: Oral  Oral Oral  SpO2: 100%  100% 98%  Weight:      Height:        Intake/Output Summary (Last 24 hours) at 11/21/2020 1940 Last data filed at 11/21/2020  1900 Gross per 24 hour  Intake --  Output 1355 ml  Net -1355 ml   Filed Weights   11/06/20 0109 11/08/20 0340 11/09/20 0353  Weight: 120.3 kg 118.9 kg 119 kg    Data Reviewed: I have personally reviewed and interpreted daily labs, tele strips, imaging. I reviewed all nursing notes, pharmacy notes, vitals, pertinent old records I have discussed plan of care as described above with RN and patient/family.  CBC: Recent Labs  Lab 11/17/20 0100 11/18/20 0142 11/19/20 0111 11/20/20 0239 11/21/20 0502  WBC 15.1* 16.6* 17.7* 20.8* 17.0*  NEUTROABS  --   --   --   --  13.8*  HGB 9.0* 9.4* 9.5* 10.0* 9.6*  HCT 28.1* 30.1* 30.3* 31.3* 29.7*  MCV 92.7 94.4 95.3 93.7 91.7  PLT 367 421* 415* 447* 347*   Basic Metabolic Panel: Recent Labs  Lab 11/15/20 0941 11/16/20 0141 11/17/20 0100 11/18/20 0142 11/19/20 0111 11/21/20 0502  NA 135 134* 135 137 134* 132*  K 4.5 4.0 3.5 3.4* 3.9 4.4  CL 91* 93* 97* 97* 98 94*  CO2 34* 29 29 31 28 28   GLUCOSE 112* 108* 157* 130* 128* 125*  BUN 13 12 11 10 11 8   CREATININE 0.93 0.77 0.77 0.74 0.76 0.73  CALCIUM 8.3* 8.4* 8.3* 8.5* 8.5* 8.5*  MG  --   --   --   --   --  2.0  PHOS 4.7*  4.1 4.6 5.2* 4.2  --     Studies: CT ABDOMEN PELVIS W CONTRAST  Result Date: 11/20/2020 CLINICAL DATA:  Abdominal pain, fever, known pancreatitis EXAM: CT ABDOMEN AND PELVIS WITH CONTRAST TECHNIQUE: Multidetector CT imaging of the abdomen and pelvis was performed using the standard protocol following bolus administration of intravenous contrast. CONTRAST:  20mL OMNIPAQUE IOHEXOL 300 MG/ML  SOLN COMPARISON:  CT 11/13/2020 FINDINGS: Lower chest: Bandlike subsegmental atelectasis or scarring in the lung bases. Normal heart size. No pericardial effusion. Hepatobiliary: No concerning focal liver lesion. Smooth surface contour. Normal hepatic attenuation. Prior cholecystectomy. Surgical drain remains in place terminating at the gallbladder fossa, unchanged position from  comparison exam. Persistent intra and extrahepatic biliary ductal dilatation despite the presence of paired common bile duct stents terminating in the extrahepatic common bile duct proximally and duodenum distally. These traverse the vicinity of a complex pancreatic collection further described below. No visible intraductal gallstones. Pancreas: Irregular, patchy enhancement of the pancreas, particularly with hypoattenuation involving the pancreatic head, uncinate and proximal body. Extensive heterogeneous and edematous changes throughout the pancreatic parenchyma with extensive surrounding peripancreatic fluid and phlegmon. A more focal air and fluid containing collection is seen at the level of the pancreatic head and uncinate measuring 4.9 x 3.6 by 5.8 cm in size appearing to extend along the pancreatic duct. There is evidence of of surgical gastrocystostomy stent between the gastric wall and collection along the pancreatic head and proximal body which is traversed by a small pigtail catheter terminating approximately in the gastric lumen and distally within the lateral portion of this persisting collection. Additional adjacent ill-defined pancreatic phlegmon versus collection measuring approximately 9 x 9.4 cm (3/40) not significantly changed from comparison when measured at similar levels. This extends along portion of the transverse colon likely with some reactive thickening. Additional extensive inflammatory phlegmon centered in the upper mesentery and retroperitoneum. No definite new collection or abscess is seen. Spleen: Normal in size. No concerning splenic lesions. Adrenals/Urinary Tract: No concerning adrenal nodules. Kidneys are normally located with symmetric enhancement and excretion. No suspicious renal lesion, urolithiasis or hydronephrosis. Urinary bladder is unremarkable for the degree of distention. Stomach/Bowel: Distal esophagus unremarkable. Cysto gastrostomy noted, as detailed above.  Extensive circumferential thickening of the duodenum, much of which is favored to be reactive. Several loops of small bowel and portion of the transverse colon adjacent the heterogeneous peripancreatic phlegmon and fluid collections also likely reactive. No resulting bowel obstruction is seen at this time. The appendix is not well visualized. No extravasation of high attenuation enteric contrast media. Vascular/Lymphatic: No significant vascular findings are present. Suboptimal opacification of the portal and hepatic veins to assess for patency. Clustered reactive appearing upper mesenteric nodes are again seen (3/50-55). No new enlarged or enlarging lymph nodes are present. Reproductive: The prostate and seminal vesicles are unremarkable. Other: Edematous changes of the umbilicus. No bowel containing hernia. Fat containing right inguinal hernia. No bowel containing hernia. Mild posterior body wall edema. Musculoskeletal: Multilevel degenerative changes are present in the imaged portions of the spine. Features most pronounced L3-4. Bilateral L3 pars defects noted as well. Mild degenerative changes in hips and pelvis. IMPRESSION: Redemonstrated features of persistent severe necrotizing pancreatitis with extensive surrounding peripancreatic fluid and phlegmon as well as a more central region walled off necrosis which appears partially decompressed by a cystogastrostomy which is traversed by a small pigtail drainage catheter though this is notably along the far peripheral aspect of the pancreatic collection and may not adequately drain this  collection given complexity and persistence from comparison imaging. More ill-defined phlegmon noted throughout the mid mesentery and retroperitoneum resulting in what is likely reactive thickening across multiple loops of small bowel, transverse colon and much of the duodenal sweep. Persistent biliary ductal dilatation despite the presence of 2 common bile duct stents which are  positioned proximally in the extrahepatic common bile duct and distally within the duodenal lumen. Postsurgical changes from cholecystectomy with surgical drain at the level of the gallbladder fossa, unchanged prior. Electronically Signed   By: Lovena Le M.D.   On: 11/20/2020 22:23   [place] EKG SITE RITE  Result Date: 11/21/2020 If Site Rite image not attached, placement could not be confirmed due to current cardiac rhythm.   Scheduled Meds: . feeding supplement  237 mL Oral TID BM  . fluconazole  400 mg Oral Daily  . heparin injection (subcutaneous)  5,000 Units Subcutaneous Q8H  . multivitamin with minerals  1 tablet Oral Daily  . polyethylene glycol  17 g Oral Daily  . risperiDONE  1 mg Oral QHS  . senna-docusate  1 tablet Oral BID  . traZODone  150 mg Oral QHS   Continuous Infusions: . meropenem (MERREM) IV 1 g (11/21/20 1454)   PRN Meds: acetaminophen **OR** acetaminophen, bisacodyl, diphenhydrAMINE **OR** diphenhydrAMINE, Gerhardt's butt cream, ipratropium-albuterol, morphine injection, ondansetron **OR** ondansetron (ZOFRAN) IV, oxyCODONE  Time spent: 35 minutes  Author: Berle Mull, MD [facility] 11/21/2020 7:40 PM  To reach On-call, see care teams to locate the attending and reach out via [email or web address]. Between 7PM-7AM, please contact night-coverage If you still have difficulty reaching the attending provider, please page the [facility] (Director on Call) for [facility] on amion for assistance.

## 2020-11-21 NOTE — Progress Notes (Signed)
Daily Rounding Note  11/22/2020, 8:55 AM  LOS: 21 days   SUBJECTIVE:   Chief complaint: Post ERCP necrotic pancreatitis.  Status post necrosectomy.  Status post biliary stent and perc drain.      No fevers since 4 AM on 5/18.  Heart rate to 110 this morning.  Stable blood pressure and excellent room air sats. Output from percutaneous biliary drain 195 mL.. 150 mL on previous 2 d, down from 200s, high 300s previously.    Pt continues to have some abdominal pain, it is a bit worse after he moves around and walks in the hallway.  Used 4 mg IV morphine and 20 mg oxycodone yesterday for pain management.  Continues to eat at least 75% of meals.  Having regular bowel movements.  Overall feels well.  OBJECTIVE:         Vital signs in last 24 hours:    Temp:  [98.5 F (36.9 C)-100.1 F (37.8 C)] 98.5 F (36.9 C) (05/20 0806) Pulse Rate:  [100-110] 101 (05/20 0806) Resp:  [18-20] 18 (05/20 0806) BP: (119-135)/(71-84) 135/84 (05/20 0806) SpO2:  [93 %-100 %] 95 % (05/20 0806) Last BM Date: 11/20/20 Filed Weights   11/06/20 0109 11/08/20 0340 11/09/20 0353  Weight: 120.3 kg 118.9 kg 119 kg   General: Comfortable, NAD.  Looks somewhat chronically unwell but not acutely ill or toxic. Heart: RRR. Chest: Clear bilaterally without labored breathing. Abdomen: Obese, protuberant, distended.  Firmness felt in left upper quadrant.  Tenderness across upper and mid abdomen is not severe.  Bowel sounds active. Extremities: Lower extremity edema stable. Neuro/Psych: Pleasant, calm, cooperative.  Fully alert and oriented.  Intake/Output from previous day: 05/19 0701 - 05/20 0700 In: -  Out: 1425 [Urine:1275; Drains:150]  Intake/Output this shift: No intake/output data recorded.  Lab Results: Recent Labs    11/20/20 0239 11/21/20 0502 11/22/20 0142  WBC 20.8* 17.0* 17.0*  HGB 10.0* 9.6* 10.1*  HCT 31.3* 29.7* 31.1*  PLT 447* 437*  438*   BMET Recent Labs    11/21/20 0502 11/22/20 0142  NA 132* 131*  K 4.4 4.2  CL 94* 93*  CO2 28 27  GLUCOSE 125* 139*  BUN 8 10  CREATININE 0.73 0.78  CALCIUM 8.5* 8.3*   LFT Recent Labs    11/21/20 0502 11/22/20 0142  PROT 6.7 6.9  ALBUMIN 2.1* 2.1*  AST 30 33  ALT 30 35  ALKPHOS 173* 195*  BILITOT 0.8 0.5   PT/INR No results for input(s): LABPROT, INR in the last 72 hours. Hepatitis Panel No results for input(s): HEPBSAG, HCVAB, HEPAIGM, HEPBIGM in the last 72 hours.  Studies/Results: CT ABDOMEN PELVIS W CONTRAST  Result Date: 11/20/2020 CLINICAL DATA:  Abdominal pain, fever, known pancreatitis EXAM: CT ABDOMEN AND PELVIS WITH CONTRAST TECHNIQUE: Multidetector CT imaging of the abdomen and pelvis was performed using the standard protocol following bolus administration of intravenous contrast. CONTRAST:  63mL OMNIPAQUE IOHEXOL 300 MG/ML  SOLN COMPARISON:  CT 11/13/2020 FINDINGS: Lower chest: Bandlike subsegmental atelectasis or scarring in the lung bases. Normal heart size. No pericardial effusion. Hepatobiliary: No concerning focal liver lesion. Smooth surface contour. Normal hepatic attenuation. Prior cholecystectomy. Surgical drain remains in place terminating at the gallbladder fossa, unchanged position from comparison exam. Persistent intra and extrahepatic biliary ductal dilatation despite the presence of paired common bile duct stents terminating in the extrahepatic common bile duct proximally and duodenum distally. These traverse the vicinity of a  complex pancreatic collection further described below. No visible intraductal gallstones. Pancreas: Irregular, patchy enhancement of the pancreas, particularly with hypoattenuation involving the pancreatic head, uncinate and proximal body. Extensive heterogeneous and edematous changes throughout the pancreatic parenchyma with extensive surrounding peripancreatic fluid and phlegmon. A more focal air and fluid containing  collection is seen at the level of the pancreatic head and uncinate measuring 4.9 x 3.6 by 5.8 cm in size appearing to extend along the pancreatic duct. There is evidence of of surgical gastrocystostomy stent between the gastric wall and collection along the pancreatic head and proximal body which is traversed by a small pigtail catheter terminating approximately in the gastric lumen and distally within the lateral portion of this persisting collection. Additional adjacent ill-defined pancreatic phlegmon versus collection measuring approximately 9 x 9.4 cm (3/40) not significantly changed from comparison when measured at similar levels. This extends along portion of the transverse colon likely with some reactive thickening. Additional extensive inflammatory phlegmon centered in the upper mesentery and retroperitoneum. No definite new collection or abscess is seen. Spleen: Normal in size. No concerning splenic lesions. Adrenals/Urinary Tract: No concerning adrenal nodules. Kidneys are normally located with symmetric enhancement and excretion. No suspicious renal lesion, urolithiasis or hydronephrosis. Urinary bladder is unremarkable for the degree of distention. Stomach/Bowel: Distal esophagus unremarkable. Cysto gastrostomy noted, as detailed above. Extensive circumferential thickening of the duodenum, much of which is favored to be reactive. Several loops of small bowel and portion of the transverse colon adjacent the heterogeneous peripancreatic phlegmon and fluid collections also likely reactive. No resulting bowel obstruction is seen at this time. The appendix is not well visualized. No extravasation of high attenuation enteric contrast media. Vascular/Lymphatic: No significant vascular findings are present. Suboptimal opacification of the portal and hepatic veins to assess for patency. Clustered reactive appearing upper mesenteric nodes are again seen (3/50-55). No new enlarged or enlarging lymph nodes are  present. Reproductive: The prostate and seminal vesicles are unremarkable. Other: Edematous changes of the umbilicus. No bowel containing hernia. Fat containing right inguinal hernia. No bowel containing hernia. Mild posterior body wall edema. Musculoskeletal: Multilevel degenerative changes are present in the imaged portions of the spine. Features most pronounced L3-4. Bilateral L3 pars defects noted as well. Mild degenerative changes in hips and pelvis. IMPRESSION: Redemonstrated features of persistent severe necrotizing pancreatitis with extensive surrounding peripancreatic fluid and phlegmon as well as a more central region walled off necrosis which appears partially decompressed by a cystogastrostomy which is traversed by a small pigtail drainage catheter though this is notably along the far peripheral aspect of the pancreatic collection and may not adequately drain this collection given complexity and persistence from comparison imaging. More ill-defined phlegmon noted throughout the mid mesentery and retroperitoneum resulting in what is likely reactive thickening across multiple loops of small bowel, transverse colon and much of the duodenal sweep. Persistent biliary ductal dilatation despite the presence of 2 common bile duct stents which are positioned proximally in the extrahepatic common bile duct and distally within the duodenal lumen. Postsurgical changes from cholecystectomy with surgical drain at the level of the gallbladder fossa, unchanged prior. Electronically Signed   By: Lovena Le M.D.   On: 11/20/2020 22:23    ASSESMENT:   *Post ERCP pancreatitis with pseudocyst/necrosis. S/p ERCP, cyst gastrostomy, stent placement, necrosectomy x 3.Original biliary stent removed/replacedand a second stent was placed as of 5/9,double-pigtail stents replaced as of latest ERCP 5/12. Clinically much improved for over a week. Meropenem/Flagyl/Diflucan switched for less than 24  hours to Augmentin,  p.o. Flagyl starting 5/17 but resumed meropenem due to recurrent mild fever and rising WBCs.   5/18 CT confirms ongoing severe necrotizing pancreatitis with extensive phlegmon, fluid.  Area of walled off necrosis partially decompressed by cyst gastrostomy may not be adequately drained by pigtail catheter.  Additional phlegmon in mesentery, retroperitoneum with reactive small bowel and colon thickening.  Biliary duct remains dilated despite presence of 2 stents.  Surgical drain in place at GB fossa. WBCs stable at 17 K.  No recurrent fever.    *Post cholecystectomy bile leak leading to ERCP and biliary stent placement.  Percutaneous biliary drain in place.  *Severe malnutrition. Appetite has improved dramatically following the necrosectomy's and RD has calculated he is meeting greater than or equal to 90% of his caloric needs.  Recommends continuing Ensure Enlive tid in addition to soft diet.  *Stable, normocytic anemia.  *   Stable mild tachycardia low 100s to 1teens.    *Moderate esophagitis on 11/11/2020 EGD.    PLAN   *   IR Dr Earleen Newport: "Low confidence of technical success with drainage (of mesocolic phlegmon) at this time... Possible rescan 24 to 48 hours to reassess for possible drainage."  *   Repeat CT next week?  If he is to go home prior to repeating CT scan, patient wondering if that could be performed at [facility] though he is willing to come to [facility] for scans if that is what is required.  *   Dr. Graylon Good planning picc for continued meropenem in addition to oral fluconazole. Pt has RN support at home for IV med administration.    *   GI available over weekend but do not plan to round unless issues crop up.     [name]  11/22/2020, 8:55 AM Phone [phone]

## 2020-11-22 DIAGNOSIS — K839 Disease of biliary tract, unspecified: Secondary | ICD-10-CM | POA: Diagnosis not present

## 2020-11-22 LAB — CBC WITH DIFFERENTIAL/PLATELET
Abs Immature Granulocytes: 0.18 10*3/uL — ABNORMAL HIGH (ref 0.00–0.07)
Basophils Absolute: 0.1 10*3/uL (ref 0.0–0.1)
Basophils Relative: 0 %
Eosinophils Absolute: 0.2 10*3/uL (ref 0.0–0.5)
Eosinophils Relative: 1 %
HCT: 31.1 % — ABNORMAL LOW (ref 39.0–52.0)
Hemoglobin: 10.1 g/dL — ABNORMAL LOW (ref 13.0–17.0)
Immature Granulocytes: 1 %
Lymphocytes Relative: 11 %
Lymphs Abs: 1.8 10*3/uL (ref 0.7–4.0)
MCH: 30 pg (ref 26.0–34.0)
MCHC: 32.5 g/dL (ref 30.0–36.0)
MCV: 92.3 fL (ref 80.0–100.0)
Monocytes Absolute: 1.3 10*3/uL — ABNORMAL HIGH (ref 0.1–1.0)
Monocytes Relative: 8 %
Neutro Abs: 13.4 10*3/uL — ABNORMAL HIGH (ref 1.7–7.7)
Neutrophils Relative %: 79 %
Platelets: 438 10*3/uL — ABNORMAL HIGH (ref 150–400)
RBC: 3.37 MIL/uL — ABNORMAL LOW (ref 4.22–5.81)
RDW: 13.7 % (ref 11.5–15.5)
WBC: 17 10*3/uL — ABNORMAL HIGH (ref 4.0–10.5)
nRBC: 0.2 % (ref 0.0–0.2)

## 2020-11-22 LAB — COMPREHENSIVE METABOLIC PANEL
ALT: 35 U/L (ref 0–44)
AST: 33 U/L (ref 15–41)
Albumin: 2.1 g/dL — ABNORMAL LOW (ref 3.5–5.0)
Alkaline Phosphatase: 195 U/L — ABNORMAL HIGH (ref 38–126)
Anion gap: 11 (ref 5–15)
BUN: 10 mg/dL (ref 6–20)
CO2: 27 mmol/L (ref 22–32)
Calcium: 8.3 mg/dL — ABNORMAL LOW (ref 8.9–10.3)
Chloride: 93 mmol/L — ABNORMAL LOW (ref 98–111)
Creatinine, Ser: 0.78 mg/dL (ref 0.61–1.24)
GFR, Estimated: >60 mL/min (ref >60)
Glucose, Bld: 139 mg/dL — ABNORMAL HIGH (ref 70–99)
Potassium: 4.2 mmol/L (ref 3.5–5.1)
Sodium: 131 mmol/L — ABNORMAL LOW (ref 135–145)
Total Bilirubin: 0.5 mg/dL (ref 0.3–1.2)
Total Protein: 6.9 g/dL (ref 6.5–8.1)

## 2020-11-22 MED ORDER — CHLORHEXIDINE GLUCONATE CLOTH 2 % EX PADS: 6.0000 | MEDICATED_PAD | Freq: Every day | CUTANEOUS | Status: DC

## 2020-11-22 MED ORDER — FUROSEMIDE 10 MG/ML IJ SOLN
40.0000 mg | Freq: Once | INTRAMUSCULAR | Status: AC
  Administered 2020-11-22: 40 mg via INTRAVENOUS
  Filled 2020-11-22: qty 4

## 2020-11-22 MED ORDER — SODIUM CHLORIDE 0.9% FLUSH: 10.0000 mL | INTRAVENOUS | Status: DC | PRN

## 2020-11-22 NOTE — Progress Notes (Signed)
[facility] [place] for Infectious Disease    Date of Admission:  11/01/2020   Total days of antibiotics 20           ID: Steve Romero is a 50 y.o. male with  Principal Problem:   Disorder of bile duct stent Active Problems:   Pancreatic necrosis   Elevated LFTs   Ascending cholangitis   Protein-calorie malnutrition, severe   Bile leak    Subjective: Sedated after getting picc line. No difficulty with picc placement to right arm. Patient denies diarrhea. No abdominal pain. Passing gas no constipation  Medications:  . Chlorhexidine Gluconate Cloth  6 each Topical Daily  . feeding supplement  237 mL Oral TID BM  . fluconazole  400 mg Oral Daily  . heparin injection (subcutaneous)  5,000 Units Subcutaneous Q8H  . multivitamin with minerals  1 tablet Oral Daily  . polyethylene glycol  17 g Oral Daily  . risperiDONE  1 mg Oral QHS  . senna-docusate  1 tablet Oral BID  . traZODone  150 mg Oral QHS    Objective: Vital signs in last 24 hours: Temp:  [98.5 F (36.9 C)-100.1 F (37.8 C)] 99 F (37.2 C) (05/20 1205) Pulse Rate:  [100-101] 101 (05/20 0806) Resp:  [18-20] 18 (05/20 0806) BP: (119-135)/(71-84) 125/71 (05/20 1205) SpO2:  [93 %-98 %] 94 % (05/20 1205) Physical Exam  Constitutional: He is oriented to person, place, and time. He appears well-developed and well-nourished. No distress.  HENT: edentulous Mouth/Throat: Oropharynx is clear and moist. No oropharyngeal exudate.  Cardiovascular: Normal rate, regular rhythm and normal heart sounds. Exam reveals no gallop and no friction rub.  No murmur heard.  Pulmonary/Chest: Effort normal and breath sounds normal. No respiratory distress. He has no wheezes.  Abdominal: protuberant abd. Soft. Bowel sounds are normal. He exhibits no distension. There is no tenderness.  Lymphadenopathy:  He has no cervical adenopathy.  Neurological: He is alert and oriented to person, place, and time.  Skin: Skin is warm and dry. No rash  noted. No erythema.  Psychiatric: He has a normal mood and affect. His behavior is normal.     Lab Results Recent Labs    11/21/20 0502 11/22/20 0142  WBC 17.0* 17.0*  HGB 9.6* 10.1*  HCT 29.7* 31.1*  NA 132* 131*  K 4.4 4.2  CL 94* 93*  CO2 28 27  BUN 8 10  CREATININE 0.73 0.78   Liver Panel Recent Labs    11/21/20 0502 11/22/20 0142  PROT 6.7 6.9  ALBUMIN 2.1* 2.1*  AST 30 33  ALT 30 35  ALKPHOS 173* 195*  BILITOT 0.8 0.5   Sedimentation Rate Recent Labs    11/19/20 1323  ESRSEDRATE 44*   C-Reactive Protein Recent Labs    11/19/20 1323  CRP 22.2*    Microbiology: reviewed Studies/Results: CT ABDOMEN PELVIS W CONTRAST  Result Date: 11/20/2020 CLINICAL DATA:  Abdominal pain, fever, known pancreatitis EXAM: CT ABDOMEN AND PELVIS WITH CONTRAST TECHNIQUE: Multidetector CT imaging of the abdomen and pelvis was performed using the standard protocol following bolus administration of intravenous contrast. CONTRAST:  29mL OMNIPAQUE IOHEXOL 300 MG/ML  SOLN COMPARISON:  CT 11/13/2020 FINDINGS: Lower chest: Bandlike subsegmental atelectasis or scarring in the lung bases. Normal heart size. No pericardial effusion. Hepatobiliary: No concerning focal liver lesion. Smooth surface contour. Normal hepatic attenuation. Prior cholecystectomy. Surgical drain remains in place terminating at the gallbladder fossa, unchanged position from comparison exam. Persistent intra and extrahepatic biliary ductal  dilatation despite the presence of paired common bile duct stents terminating in the extrahepatic common bile duct proximally and duodenum distally. These traverse the vicinity of a complex pancreatic collection further described below. No visible intraductal gallstones. Pancreas: Irregular, patchy enhancement of the pancreas, particularly with hypoattenuation involving the pancreatic head, uncinate and proximal body. Extensive heterogeneous and edematous changes throughout the pancreatic  parenchyma with extensive surrounding peripancreatic fluid and phlegmon. A more focal air and fluid containing collection is seen at the level of the pancreatic head and uncinate measuring 4.9 x 3.6 by 5.8 cm in size appearing to extend along the pancreatic duct. There is evidence of of surgical gastrocystostomy stent between the gastric wall and collection along the pancreatic head and proximal body which is traversed by a small pigtail catheter terminating approximately in the gastric lumen and distally within the lateral portion of this persisting collection. Additional adjacent ill-defined pancreatic phlegmon versus collection measuring approximately 9 x 9.4 cm (3/40) not significantly changed from comparison when measured at similar levels. This extends along portion of the transverse colon likely with some reactive thickening. Additional extensive inflammatory phlegmon centered in the upper mesentery and retroperitoneum. No definite new collection or abscess is seen. Spleen: Normal in size. No concerning splenic lesions. Adrenals/Urinary Tract: No concerning adrenal nodules. Kidneys are normally located with symmetric enhancement and excretion. No suspicious renal lesion, urolithiasis or hydronephrosis. Urinary bladder is unremarkable for the degree of distention. Stomach/Bowel: Distal esophagus unremarkable. Cysto gastrostomy noted, as detailed above. Extensive circumferential thickening of the duodenum, much of which is favored to be reactive. Several loops of small bowel and portion of the transverse colon adjacent the heterogeneous peripancreatic phlegmon and fluid collections also likely reactive. No resulting bowel obstruction is seen at this time. The appendix is not well visualized. No extravasation of high attenuation enteric contrast media. Vascular/Lymphatic: No significant vascular findings are present. Suboptimal opacification of the portal and hepatic veins to assess for patency. Clustered  reactive appearing upper mesenteric nodes are again seen (3/50-55). No new enlarged or enlarging lymph nodes are present. Reproductive: The prostate and seminal vesicles are unremarkable. Other: Edematous changes of the umbilicus. No bowel containing hernia. Fat containing right inguinal hernia. No bowel containing hernia. Mild posterior body wall edema. Musculoskeletal: Multilevel degenerative changes are present in the imaged portions of the spine. Features most pronounced L3-4. Bilateral L3 pars defects noted as well. Mild degenerative changes in hips and pelvis. IMPRESSION: Redemonstrated features of persistent severe necrotizing pancreatitis with extensive surrounding peripancreatic fluid and phlegmon as well as a more central region walled off necrosis which appears partially decompressed by a cystogastrostomy which is traversed by a small pigtail drainage catheter though this is notably along the far peripheral aspect of the pancreatic collection and may not adequately drain this collection given complexity and persistence from comparison imaging. More ill-defined phlegmon noted throughout the mid mesentery and retroperitoneum resulting in what is likely reactive thickening across multiple loops of small bowel, transverse colon and much of the duodenal sweep. Persistent biliary ductal dilatation despite the presence of 2 common bile duct stents which are positioned proximally in the extrahepatic common bile duct and distally within the duodenal lumen. Postsurgical changes from cholecystectomy with surgical drain at the level of the gallbladder fossa, unchanged prior. Electronically Signed   By: Lovena Le M.D.   On: 11/20/2020 22:23   [place] EKG SITE RITE  Result Date: 11/21/2020 If Site Rite image not attached, placement could not be confirmed due to current  cardiac rhythm.    Assessment/Plan: Infected pancreatic pseudocyst with necrotizing pancreatitis = plan for 3 addn weeks of meropenem plus oral  fluconazole 400mg  daily. Recommend weekly cmp.this should bridge him through when he has his drains exchanged.  We will sign off. We will plan to see patient back in 2-3 wk at completion of abtx to remove picc line  [facility] for Infectious Diseases Cell: [phone] Pager: [phone]  11/22/2020, 1:19 PM

## 2020-11-22 NOTE — Progress Notes (Signed)
[facility] Progress Note  Patient: Steve Romero    DVV:[id number]  DOA: 11/01/2020     Date of Service: the patient was seen and examined on 11/22/2020  Brief hospital course: Past medical history of COPD, bipolar disorder, schizoaffective disorder, depression.  Presents with complaints of abdominal pain found to have acute pancreatitis with necrosis.  GI following. Currently plan is further work-up for worsening leukocytosis. Recent History: 10/09/20 cholecystectomy complicated by postop bile leak due to torn cystic duct that was clipped.  JP drain placed. 10/10/20 ERCP with sphincterotomy and placement of plastic stent. Developed post ERCP pancreatitis. Plan was for repeat ERCP and likely stent removal in 6 weeks.   Discharged after 4/6 - 4/13 admission.  4/29 Readmitted from surgeons's office. Pt has developed large, complex fluid collection at the pancreatic head, uncinate, proximal body. 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 The necrotic collection is causing mass-effect on the gastric antrum/pylorus/proximal duodenum with reactive small bowel thickening. Stentin stable position. Intrapelvic bile duct dilation is mild to moderate likely due to mass-effect from the fluid collection. There is fluid edema surrounding the pancreas extending into the retroperitoneum and mesentery. Reactive thickening in the colon.  Significant Events:  5/5 ERCP / EUS - see Op Note findings.    5/6 LFT's improved, jaundice resolved 5/7 ERCP - see op note. Prior stent accessible now and new one placed. Partial necrosectomy 5/9 ERCP - necrosectomy and replacement of stent 5/12 ERCP with necrosectomy  Assessment and Plan: Bile leak status post cholecystectomy 4/6 with JP drain placement Post-ERCP Pancreatitis with necrotic fluid collection 4/7 JP drain in place and will be followed up by Dr. Constance Haw after discharge. Initially admitted at [facility]. General  surgery there Dr. Constance Haw d/w Dr. Rush Landmark who advised transfer to [facility] for possible advanced ERCP. He has had multiple ERCP with necrosectomy Currently on soft diet He was treated with a prolonged course of meropenem, but has since been transitioned to Augmentin, now back on IV meropenem and plan is to discharge home with meropenem with a PICC line. RD consult for supplement recs Pain control, antiemetics PRN Cultures from pancreatic cyst fluid shows moderate candida albicans Continue on diflucan appreciate ID assistance plan is to continue on augmentin and diflucan for an additional 3 weeks until he can follow up with GI to have repeat ERCP and possible stent removal Repeat CT scan on 5/18 continues to show a phlegmon.  GI recommended IR evaluation but currently IR recommending repeat CT in 48 hours to ensure that the phlegmon organizes.  Per ID patient will be discharged on meropenem.  Leukocytosis  trending up over the last 24 hours.  Bilateral lower extremity edema  Intermittently treated with IV Lasix 40 mg Monitor renal function and electrolytes Duplex U/S LE's negative for DVT D-dimer elevated, but suspect that is related to pancreatitis Echo last month poor study, EF >75%, no mention of diastolic findings Suspect hypoalbuminemia from poor po intake contributing to anasarca Started on albumin infusions with lasix Urine output has been fair overall LE swelling is markedly improved. Will hold off on further diuretics for now  COPD stable, without no exacerbation sx's.   Duonebs PRN  Schizoaffective disorder, Depression  stable. resume Risperdal, trazodone  Obesity:  Body mass index is 38.19 kg/m.  Complicates overall care and prognosis.  Recommend lifestyle modifications including physical activity and diet for weight loss and overall long-term health.  Severe protein calorie malnutrition. Placing the patient at high risk of  poor outcome. Body mass index  is 38.19 kg/m.  Nutrition Problem: Severe Malnutrition Etiology: acute illness Interventions: Interventions: Ensure Enlive (each supplement provides 350kcal and 20 grams of protein),Magic cup,MVI  Diet: Soft diet DVT Prophylaxis:   Place TED hose Start: 11/22/20 1123 heparin injection 5,000 Units Start: 11/15/20 1400   Advance goals of care discussion: Full code  Family Communication: no family was present at bedside, at the time of interview.   Disposition:  Status is: Inpatient  Remains inpatient appropriate because:Ongoing active pain requiring inpatient pain management and Ongoing diagnostic testing needed not appropriate for outpatient work up  Dispo: The patient is from: Home              Anticipated d/c is to: Home              Patient currently is not medically stable to d/c.   Difficult to place patient No  Subjective: No pain right now.  No nausea no vomiting.  Tolerating oral diet.  No constipation reported.  Physical Exam:  General: Appear in mild distress, no Rash; Oral Mucosa Clear, moist. no Abnormal Neck Mass Or lumps, Conjunctiva normal  Cardiovascular: S1 and S2 Present, no Murmur, Respiratory: good respiratory effort, Bilateral Air entry present and CTA, no Crackles, no wheezes Abdomen: Bowel Sound present, Soft and distended, no tenderness Extremities: no Pedal edema Neurology: alert and oriented to time, place, and person affect appropriate. no new focal deficit Gait not checked due to patient safety concerns  Vitals:   11/21/20 2332 11/22/20 0414 11/22/20 0806 11/22/20 1205  BP: 119/81 135/74 135/84 125/71  Pulse: (!) 101 (!) 101 (!) 101   Resp: 18 18 18    Temp: 99.1 F (37.3 C) 99.1 F (37.3 C) 98.5 F (36.9 C) 99 F (37.2 C)  TempSrc: Oral Oral Oral Oral  SpO2: 93% 96% 95% 94%  Weight:      Height:        Intake/Output Summary (Last 24 hours) at 11/22/2020 1921 Last data filed at 11/22/2020 1800 Gross per 24 hour  Intake 240 ml  Output  2775 ml  Net -2535 ml   Filed Weights   11/06/20 0109 11/08/20 0340 11/09/20 0353  Weight: 120.3 kg 118.9 kg 119 kg    Data Reviewed: I have personally reviewed and interpreted daily labs, tele strips, imaging. I reviewed all nursing notes, pharmacy notes, vitals, pertinent old records I have discussed plan of care as described above with RN and patient/family.  CBC: Recent Labs  Lab 11/18/20 0142 11/19/20 0111 11/20/20 0239 11/21/20 0502 11/22/20 0142  WBC 16.6* 17.7* 20.8* 17.0* 17.0*  NEUTROABS  --   --   --  13.8* 13.4*  HGB 9.4* 9.5* 10.0* 9.6* 10.1*  HCT 30.1* 30.3* 31.3* 29.7* 31.1*  MCV 94.4 95.3 93.7 91.7 92.3  PLT 421* 415* 447* 437* 782*   Basic Metabolic Panel: Recent Labs  Lab 11/16/20 0141 11/17/20 0100 11/18/20 0142 11/19/20 0111 11/21/20 0502 11/22/20 0142  NA 134* 135 137 134* 132* 131*  K 4.0 3.5 3.4* 3.9 4.4 4.2  CL 93* 97* 97* 98 94* 93*  CO2 29 29 31 28 28 27   GLUCOSE 108* 157* 130* 128* 125* 139*  BUN 12 11 10 11 8 10   CREATININE 0.77 0.77 0.74 0.76 0.73 0.78  CALCIUM 8.4* 8.3* 8.5* 8.5* 8.5* 8.3*  MG  --   --   --   --  2.0  --   PHOS 4.1 4.6 5.2* 4.2  --   --  Studies: No results found.  Scheduled Meds: . Chlorhexidine Gluconate Cloth  6 each Topical Daily  . feeding supplement  237 mL Oral TID BM  . fluconazole  400 mg Oral Daily  . heparin injection (subcutaneous)  5,000 Units Subcutaneous Q8H  . multivitamin with minerals  1 tablet Oral Daily  . polyethylene glycol  17 g Oral Daily  . risperiDONE  1 mg Oral QHS  . senna-docusate  1 tablet Oral BID  . traZODone  150 mg Oral QHS   Continuous Infusions: . meropenem (MERREM) IV 1 g (11/22/20 1453)   PRN Meds: acetaminophen **OR** acetaminophen, bisacodyl, diphenhydrAMINE **OR** diphenhydrAMINE, Gerhardt's butt cream, ipratropium-albuterol, morphine injection, ondansetron **OR** ondansetron (ZOFRAN) IV, oxyCODONE, sodium chloride flush  Time spent: 35  minutes  Author: Berle Mull, MD [facility] 11/22/2020 7:21 PM  To reach On-call, see care teams to locate the attending and reach out via [email or web address]. Between 7PM-7AM, please contact night-coverage If you still have difficulty reaching the attending provider, please page the [facility] (Director on Call) for [facility] on amion for assistance.

## 2020-11-22 NOTE — Progress Notes (Signed)
Peripherally Inserted Central Catheter Placement  The IV Nurse has discussed with the patient and/or persons authorized to consent for the patient, the purpose of this procedure and the potential benefits and risks involved with this procedure.  The benefits include less needle sticks, lab draws from the catheter, and the patient may be discharged home with the catheter. Risks include, but not limited to, infection, bleeding, blood clot (thrombus formation), and puncture of an artery; nerve damage and irregular heartbeat and possibility to perform a PICC exchange if needed/ordered by physician.  Alternatives to this procedure were also discussed.  Bard Power PICC patient education guide, fact sheet on infection prevention and patient information card has been provided to patient /or left at bedside.    PICC Placement Documentation  PICC Single Lumen 29/51/88 Right Basilic 40 cm 0 cm (Active)  Indication for Insertion or Continuance of Line Prolonged intravenous therapies 11/22/20 0854  Exposed Catheter (cm) 0 cm 11/22/20 0854  Site Assessment Clean;Dry;Intact 11/22/20 0854  Line Status Flushed;Blood return noted 11/22/20 0854  Dressing Type Transparent 11/22/20 0854  Dressing Status Clean;Dry;Intact 11/22/20 0854  Antimicrobial disc in place? Yes 11/22/20 0854  Dressing Change Due 11/29/20 11/22/20 0854       Scotty Court 11/22/2020, 9:06 AM

## 2020-11-22 NOTE — Progress Notes (Signed)
PHARMACY CONSULT NOTE FOR:  OUTPATIENT  PARENTERAL ANTIBIOTIC THERAPY (OPAT)  Indication: necrotizing pancreatitis with infected pseduocyst and fluid collection Regimen: Meropenem 1g q8h End date: 12/13/20  IV antibiotic discharge orders are pended. To discharging provider:  please sign these orders via discharge navigator, Select New Orders & click on the button choice - Manage This Unsigned Work.     Thank you for allowing pharmacy to be a part of this patient's care.  Fara Olden, PharmD PGY-1 Pharmacy Resident 11/22/2020 1:27 PM Please see AMION for all pharmacy numbers

## 2020-11-23 DIAGNOSIS — K839 Disease of biliary tract, unspecified: Secondary | ICD-10-CM | POA: Diagnosis not present

## 2020-11-23 LAB — COMPREHENSIVE METABOLIC PANEL
ALT: 37 U/L (ref 0–44)
AST: 28 U/L (ref 15–41)
Albumin: 2 g/dL — ABNORMAL LOW (ref 3.5–5.0)
Alkaline Phosphatase: 170 U/L — ABNORMAL HIGH (ref 38–126)
Anion gap: 10 (ref 5–15)
BUN: 10 mg/dL (ref 6–20)
CO2: 29 mmol/L (ref 22–32)
Calcium: 8.4 mg/dL — ABNORMAL LOW (ref 8.9–10.3)
Chloride: 92 mmol/L — ABNORMAL LOW (ref 98–111)
Creatinine, Ser: 0.91 mg/dL (ref 0.61–1.24)
GFR, Estimated: >60 mL/min (ref >60)
Glucose, Bld: 162 mg/dL — ABNORMAL HIGH (ref 70–99)
Potassium: 4.3 mmol/L (ref 3.5–5.1)
Sodium: 131 mmol/L — ABNORMAL LOW (ref 135–145)
Total Bilirubin: 0.7 mg/dL (ref 0.3–1.2)
Total Protein: 7 g/dL (ref 6.5–8.1)

## 2020-11-23 LAB — CBC WITH DIFFERENTIAL/PLATELET
Abs Immature Granulocytes: 0.25 10*3/uL — ABNORMAL HIGH (ref 0.00–0.07)
Basophils Absolute: 0.1 10*3/uL (ref 0.0–0.1)
Basophils Relative: 1 %
Eosinophils Absolute: 0.2 10*3/uL (ref 0.0–0.5)
Eosinophils Relative: 1 %
HCT: 30 % — ABNORMAL LOW (ref 39.0–52.0)
Hemoglobin: 9.6 g/dL — ABNORMAL LOW (ref 13.0–17.0)
Immature Granulocytes: 2 %
Lymphocytes Relative: 11 %
Lymphs Abs: 1.9 10*3/uL (ref 0.7–4.0)
MCH: 29.3 pg (ref 26.0–34.0)
MCHC: 32 g/dL (ref 30.0–36.0)
MCV: 91.5 fL (ref 80.0–100.0)
Monocytes Absolute: 1.3 10*3/uL — ABNORMAL HIGH (ref 0.1–1.0)
Monocytes Relative: 8 %
Neutro Abs: 13.5 10*3/uL — ABNORMAL HIGH (ref 1.7–7.7)
Neutrophils Relative %: 77 %
Platelets: 426 10*3/uL — ABNORMAL HIGH (ref 150–400)
RBC: 3.28 MIL/uL — ABNORMAL LOW (ref 4.22–5.81)
RDW: 13.6 % (ref 11.5–15.5)
WBC: 17.2 10*3/uL — ABNORMAL HIGH (ref 4.0–10.5)
nRBC: 0.1 % (ref 0.0–0.2)

## 2020-11-23 LAB — MAGNESIUM: Magnesium: 1.8 mg/dL (ref 1.7–2.4)

## 2020-11-23 MED ORDER — MEROPENEM IV (FOR PTA / DISCHARGE USE ONLY): 1.0000 g | Freq: Three times a day (TID) | INTRAVENOUS | 0 refills | Status: AC

## 2020-11-23 MED ORDER — POLYETHYLENE GLYCOL 3350 17 G PO PACK: 17.0000 g | PACK | Freq: Every day | ORAL | 0 refills | Status: DC

## 2020-11-23 MED ORDER — GERHARDT'S BUTT CREAM: 1.0000 "application " | TOPICAL_CREAM | Freq: Every day | CUTANEOUS | 0 refills | Status: DC

## 2020-11-23 MED ORDER — ENSURE ENLIVE PO LIQD: 237.0000 mL | Freq: Three times a day (TID) | ORAL | 0 refills | Status: AC

## 2020-11-23 MED ORDER — FLUCONAZOLE 200 MG PO TABS: 400.0000 mg | ORAL_TABLET | Freq: Every day | ORAL | 0 refills | Status: AC

## 2020-11-23 NOTE — Progress Notes (Signed)
[facility] Progress Note  Patient: Steve Romero    SEG:[id number]  DOA: 11/01/2020     Date of Service: the patient was seen and examined on 11/23/2020  Brief hospital course: Past medical history of COPD, bipolar disorder, schizoaffective disorder, depression.  Presents with complaints of abdominal pain found to have acute pancreatitis with necrosis.  GI following. Currently plan is further work-up for worsening leukocytosis. Recent History: 10/09/20 cholecystectomy complicated by postop bile leak due to torn cystic duct that was clipped.  JP drain placed. 10/10/20 ERCP with sphincterotomy and placement of plastic stent. Developed post ERCP pancreatitis. Plan was for repeat ERCP and likely stent removal in 6 weeks.   Discharged after 4/6 - 4/13 admission.  4/29 Readmitted from surgeons's office. Pt has developed large, complex fluid collection at the pancreatic head, uncinate, proximal body. 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 The necrotic collection is causing mass-effect on the gastric antrum/pylorus/proximal duodenum with reactive small bowel thickening. Stentin stable position. Intrapelvic bile duct dilation is mild to moderate likely due to mass-effect from the fluid collection. There is fluid edema surrounding the pancreas extending into the retroperitoneum and mesentery. Reactive thickening in the colon.  Significant Events:  5/5 ERCP / EUS - see Op Note findings.    5/6 LFT's improved, jaundice resolved 5/7 ERCP - see op note. Prior stent accessible now and new one placed. Partial necrosectomy 5/9 ERCP - necrosectomy and replacement of stent 5/12 ERCP with necrosectomy  Assessment and Plan: Bile leak status post cholecystectomy 4/6 with JP drain placement Post-ERCP Pancreatitis with necrotic fluid collection 4/7 JP drain in place and will be followed up by Dr. Constance Haw after discharge. Initially admitted at [facility]. General  surgery there Dr. Constance Haw d/w Dr. Rush Landmark who advised transfer to [facility] for possible advanced ERCP. He has had multiple ERCP with necrosectomy Currently on soft diet He was treated with a prolonged course of meropenem, but has since been transitioned to Augmentin, now back on IV meropenem and plan is to discharge home with meropenem with a PICC line. RD consult for supplement recs Pain control, antiemetics PRN Cultures from pancreatic cyst fluid shows moderate candida albicans Continue on diflucan appreciate ID assistance plan is to continue on augmentin and diflucan for an additional 3 weeks until he can follow up with GI to have repeat ERCP and possible stent removal Repeat CT scan on 5/18 continues to show a phlegmon.  GI recommended IR evaluation but currently IR recommending repeat CT in 48 hours to ensure that the phlegmon organizes.  Per ID patient will be discharged on meropenem. Current plan is to perform CT scan sometime next week, continue IV antibiotics with meropenem and arrange for home health with outpatient follow-up with GI, general surgery as well as ID.  Leukocytosis  Now stable.  Monitor.  Bilateral lower extremity edema  Intermittently treated with IV Lasix 40 mg Monitor renal function and electrolytes Duplex U/S LE's negative for DVT D-dimer elevated, but suspect that is related to pancreatitis Echo last month poor study, EF >75%, no mention of diastolic findings Suspect hypoalbuminemia from poor po intake contributing to anasarca Started on albumin infusions with lasix Urine output has been fair overall LE swelling is markedly improved. Will hold off on further diuretics for now  COPD stable, without no exacerbation sx's.   Duonebs PRN  Schizoaffective disorder, Depression  stable. resume Risperdal, trazodone  Obesity:  Body mass index is 38.19 kg/m.  Complicates overall care and prognosis.  Recommend lifestyle modifications including physical  activity and diet for weight loss and overall long-term health.  Severe protein calorie malnutrition. Placing the patient at high risk of poor outcome. Body mass index is 38.19 kg/m.  Nutrition Problem: Severe Malnutrition Etiology: acute illness Interventions: Interventions: Ensure Enlive (each supplement provides 350kcal and 20 grams of protein),Magic cup,MVI  Diet: Soft diet DVT Prophylaxis:   Place TED hose Start: 11/22/20 1123 heparin injection 5,000 Units Start: 11/15/20 1400   Advance goals of care discussion: Full code  Family Communication: no family was present at bedside, at the time of interview.   Disposition:  Status is: Inpatient  Remains inpatient appropriate because:Ongoing active pain requiring inpatient pain management and Ongoing diagnostic testing needed not appropriate for outpatient work up  Dispo: The patient is from: Home              Anticipated d/c is to: Home              Patient currently is not medically stable to d/c.   Difficult to place patient No  Subjective: Pain well controlled.  No nausea no vomiting.  No fever no chills.  No diarrhea.  Physical Exam:  General: Appear in mild distress, no Rash; Oral Mucosa Clear, moist. no Abnormal Neck Mass Or lumps, Conjunctiva normal  Cardiovascular: S1 and S2 Present, no Murmur, Respiratory: good respiratory effort, Bilateral Air entry present and CTA, no Crackles, no wheezes Abdomen: Bowel Sound present, Soft and distended, no tenderness Extremities: no Pedal edema Neurology: alert and oriented to time, place, and person affect appropriate. no new focal deficit Gait not checked due to patient safety concerns   Vitals:   11/23/20 0734 11/23/20 1146 11/23/20 1635 11/23/20 1913  BP: 124/84 130/77 131/86 130/74  Pulse: (!) 101 99 94 100  Resp: 18 18 17 16   Temp: 100 F (37.8 C) 98.7 F (37.1 C) 98.6 F (37 C) 98.6 F (37 C)  TempSrc: Oral Oral Oral Oral  SpO2: 95% 94% 100% 99%  Weight:       Height:        Intake/Output Summary (Last 24 hours) at 11/23/2020 1927 Last data filed at 11/23/2020 1800 Gross per 24 hour  Intake 600 ml  Output 1540 ml  Net -940 ml   Filed Weights   11/06/20 0109 11/08/20 0340 11/09/20 0353  Weight: 120.3 kg 118.9 kg 119 kg    Data Reviewed: I have personally reviewed and interpreted daily labs, tele strips, imaging. I reviewed all nursing notes, pharmacy notes, vitals, pertinent old records I have discussed plan of care as described above with RN and patient/family.  CBC: Recent Labs  Lab 11/19/20 0111 11/20/20 0239 11/21/20 0502 11/22/20 0142 11/23/20 0307  WBC 17.7* 20.8* 17.0* 17.0* 17.2*  NEUTROABS  --   --  13.8* 13.4* 13.5*  HGB 9.5* 10.0* 9.6* 10.1* 9.6*  HCT 30.3* 31.3* 29.7* 31.1* 30.0*  MCV 95.3 93.7 91.7 92.3 91.5  PLT 415* 447* 437* 438* 268*   Basic Metabolic Panel: Recent Labs  Lab 11/17/20 0100 11/18/20 0142 11/19/20 0111 11/21/20 0502 11/22/20 0142 11/23/20 0307  NA 135 137 134* 132* 131* 131*  K 3.5 3.4* 3.9 4.4 4.2 4.3  CL 97* 97* 98 94* 93* 92*  CO2 29 31 28 28 27 29   GLUCOSE 157* 130* 128* 125* 139* 162*  BUN 11 10 11 8 10 10   CREATININE 0.77 0.74 0.76 0.73 0.78 0.91  CALCIUM 8.3* 8.5* 8.5* 8.5* 8.3* 8.4*  MG  --   --   --  2.0  --  1.8  PHOS 4.6 5.2* 4.2  --   --   --     Studies: No results found.  Scheduled Meds: . Chlorhexidine Gluconate Cloth  6 each Topical Daily  . feeding supplement  237 mL Oral TID BM  . fluconazole  400 mg Oral Daily  . heparin injection (subcutaneous)  5,000 Units Subcutaneous Q8H  . multivitamin with minerals  1 tablet Oral Daily  . polyethylene glycol  17 g Oral Daily  . risperiDONE  1 mg Oral QHS  . senna-docusate  1 tablet Oral BID  . traZODone  150 mg Oral QHS   Continuous Infusions: . meropenem (MERREM) IV 1 g (11/23/20 1317)   PRN Meds: acetaminophen **OR** acetaminophen, bisacodyl, diphenhydrAMINE **OR** diphenhydrAMINE, Gerhardt's butt cream,  ipratropium-albuterol, morphine injection, ondansetron **OR** ondansetron (ZOFRAN) IV, oxyCODONE, sodium chloride flush  Time spent: 35 minutes  Author: Berle Mull, MD [facility] 11/23/2020 7:27 PM  To reach On-call, see care teams to locate the attending and reach out via [email or web address]. Between 7PM-7AM, please contact night-coverage If you still have difficulty reaching the attending provider, please page the [facility] (Director on Call) for [facility] on amion for assistance.

## 2020-11-23 NOTE — Progress Notes (Signed)
Received message that pt can go home. Pt needs IV abx at home. Contacted Pam with [facility]. Pam reports that she is going to work hard to arrange [facility] nursing for tomorrow, but it may not be until Monday. Dr. Posey Pronto notified.

## 2020-11-24 LAB — COMPREHENSIVE METABOLIC PANEL
ALT: 35 U/L (ref 0–44)
AST: 27 U/L (ref 15–41)
Albumin: 1.9 g/dL — ABNORMAL LOW (ref 3.5–5.0)
Alkaline Phosphatase: 173 U/L — ABNORMAL HIGH (ref 38–126)
Anion gap: 7 (ref 5–15)
BUN: 8 mg/dL (ref 6–20)
CO2: 30 mmol/L (ref 22–32)
Calcium: 8.2 mg/dL — ABNORMAL LOW (ref 8.9–10.3)
Chloride: 92 mmol/L — ABNORMAL LOW (ref 98–111)
Creatinine, Ser: 0.74 mg/dL (ref 0.61–1.24)
GFR, Estimated: >60 mL/min (ref >60)
Glucose, Bld: 130 mg/dL — ABNORMAL HIGH (ref 70–99)
Potassium: 4 mmol/L (ref 3.5–5.1)
Sodium: 129 mmol/L — ABNORMAL LOW (ref 135–145)
Total Bilirubin: 1 mg/dL (ref 0.3–1.2)
Total Protein: 6.5 g/dL (ref 6.5–8.1)

## 2020-11-24 LAB — CBC WITH DIFFERENTIAL/PLATELET
Abs Immature Granulocytes: 0.21 10*3/uL — ABNORMAL HIGH (ref 0.00–0.07)
Basophils Absolute: 0.1 10*3/uL (ref 0.0–0.1)
Basophils Relative: 0 %
Eosinophils Absolute: 0.2 10*3/uL (ref 0.0–0.5)
Eosinophils Relative: 1 %
HCT: 28.8 % — ABNORMAL LOW (ref 39.0–52.0)
Hemoglobin: 9.7 g/dL — ABNORMAL LOW (ref 13.0–17.0)
Immature Granulocytes: 1 %
Lymphocytes Relative: 11 %
Lymphs Abs: 1.8 10*3/uL (ref 0.7–4.0)
MCH: 30.5 pg (ref 26.0–34.0)
MCHC: 33.7 g/dL (ref 30.0–36.0)
MCV: 90.6 fL (ref 80.0–100.0)
Monocytes Absolute: 1.3 10*3/uL — ABNORMAL HIGH (ref 0.1–1.0)
Monocytes Relative: 8 %
Neutro Abs: 13.4 10*3/uL — ABNORMAL HIGH (ref 1.7–7.7)
Neutrophils Relative %: 79 %
Platelets: 395 10*3/uL (ref 150–400)
RBC: 3.18 MIL/uL — ABNORMAL LOW (ref 4.22–5.81)
RDW: 13.7 % (ref 11.5–15.5)
WBC: 16.9 10*3/uL — ABNORMAL HIGH (ref 4.0–10.5)
nRBC: 0.1 % (ref 0.0–0.2)

## 2020-11-24 MED ORDER — OXYCODONE HCL 5 MG PO TABS: 5.0000 mg | ORAL_TABLET | ORAL | 0 refills | Status: DC | PRN

## 2020-11-24 MED ORDER — HEPARIN SOD (PORK) LOCK FLUSH 100 UNIT/ML IV SOLN
250.0000 [IU] | INTRAVENOUS | Status: AC | PRN
  Administered 2020-11-24: 250 [IU]
  Filled 2020-11-24: qty 2.5

## 2020-11-24 MED ORDER — DOCUSATE SODIUM 100 MG PO CAPS: 100.0000 mg | ORAL_CAPSULE | Freq: Two times a day (BID) | ORAL | 0 refills | Status: DC

## 2020-11-24 NOTE — TOC Initial Note (Signed)
Transition of Care [facility]) - Initial/Assessment Note    Patient Details  Name: Steve Romero MRN: [id number] Date of Birth: May 29, 1971  Transition of [facility]) CM/SW Contact:    Bethena Roys, RN Phone Number: 11/24/2020, 12:17 PM  Clinical Narrative: Case manager received notification that the patient may transition home today. Case Manager called Pam with [facility], she has been following this patient. Pam educated the patient and family around 74 am and the patient and family felt comfortable with education. Patient has a jp drain that he has been working with prior to admission. No flushing needed. [facility] is willing to accept the patient for nursing and start care on Monday 11-25-20 at 1600. Staff RN worked with pharmacy on getting IV antibitotic time changed for earlier so the patient can be discharged home. Rolling walker to be delivered to the room prior to transition home. No further needs identified at this time.              Expected Discharge Plan: [place] Barriers to Discharge: No Barriers Identified   Patient Goals and CMS Choice Patient states their goals for this hospitalization and ongoing recovery are:: to return home   Choice offered to / list presented to :  (IV infusion company assisting with [facility] agency.)  Expected Discharge Plan and Services Expected Discharge Plan: [place] In-house Referral: NA Discharge Planning Services: CM Consult Post Acute Care Choice: [place] arrangements for the past 2 months: Single Family Home Expected Discharge Date: 11/24/20                         HH Arranged: RN HH Agency: [facility] Date HH Agency Contacted: 11/22/20 Time HH Agency Contacted:  ([facility] to provide Nursing.) Representative spoke with at [place]:  (Spoke with Jeannene Patella with [facility] this am regarding education.)  Prior Living Arrangements/Services Living arrangements for the  past 2 months: Single Family Home Lives with:: Self Patient language and need for interpreter reviewed:: Yes Do you feel safe going back to the place where you live?: Yes      Need for Family Participation in Patient Care: No (Comment) Care giver support system in place?: No (comment)   Criminal Activity/Legal Involvement Pertinent to Current Situation/Hospitalization: No - Comment as needed  Activities of Daily Living Home Assistive Devices/Equipment: None ADL Screening (condition at time of admission) Patient's cognitive ability adequate to safely complete daily activities?: Yes Is the patient deaf or have difficulty hearing?: No Does the patient have difficulty seeing, even when wearing glasses/contacts?: No Does the patient have difficulty concentrating, remembering, or making decisions?: No Patient able to express need for assistance with ADLs?: Yes Does the patient have difficulty dressing or bathing?: No Independently performs ADLs?: Yes (appropriate for developmental age) Does the patient have difficulty walking or climbing stairs?: Yes Weakness of Legs: None Weakness of Arms/Hands: None  Permission Sought/Granted Permission sought to share information with : Family Estate agent Permission granted to share information with : Yes, Verbal Permission Granted     Permission granted to share info w AGENCY: [facility]        Emotional Assessment Appearance:: Appears stated age Attitude/Demeanor/Rapport: Engaged Affect (typically observed): Appropriate Orientation: : Oriented to Place,Oriented to Self,Oriented to  Time,Oriented to Situation Alcohol / Substance Use: Not Applicable Psych Involvement: No (comment)  Admission diagnosis:  Empyema of gallbladder [K81.0] Disorder of bile duct stent [  T85.9XXA] Pancreatic necrosis [K86.89] Patient Active Problem List   Diagnosis Date Noted  . Bile leak   . Protein-calorie malnutrition,  severe 11/07/2020  . Ascending cholangitis 11/05/2020  . 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 Pancreatic necrosis   . Elevated LFTs   . Post-ERCP acute pancreatitis 10/11/2020  . Empyema of gallbladder   . Calculus of gallbladder without cholecystitis without obstruction 09/26/2020  . Abdominal wall mass of right upper quadrant 09/26/2020  . Neoplasm of uncertain behavior 92/05/9416  . Right upper quadrant abdominal pain 09/24/2020  . Mass of right upper extremity 02/14/2020  . Unilateral recurrent inguinal hernia without obstruction or gangrene   . Carpal tunnel syndrome, left 09/24/2016  . Obesity 09/01/2015  . HTN (hypertension) 04/22/2015  . Hyperlipidemia 04/22/2015  . Schizoaffective disorder ([place]) 04/22/2015  . Anxiety state 04/22/2015  . Cigarette nicotine dependence without complication 40/81/4481  . Edema 04/22/2015  . Lymphadenopathy 04/22/2015   PCP:  [name], DO Pharmacy:   [facility] [address], [place] - [address] [place] [address] Phone: [phone] Fax: [phone]     Social Determinants of Health (SDOH) Interventions    Readmission Risk Interventions No flowsheet data found.

## 2020-11-25 ENCOUNTER — Telehealth: Payer: Self-pay

## 2020-11-25 DIAGNOSIS — K8689 Other specified diseases of pancreas: Secondary | ICD-10-CM

## 2020-11-25 NOTE — Telephone Encounter (Signed)
Mansouraty, Telford Nab., MD  Virl Cagey, MD; Timothy Lasso, RN Cc: Alyson Locket, RMA Dr. Constance Haw,  Thanks for reaching out.  Looks like they were able to get him discharged over the weekend. We will get a repeat CT abdomen/pelvis to see how the AXIOS stent is doing and see how the phlegmon in the transverse mesocolon is developing. Hopefully he can do well on IV antibiotics. If the drain output is persisting in the course the next 1 to 2 weeks, then the plastic biliary stents are probably not working and we will need to move forward with fully covered self-expanding metal stent placement. I can work on arranging that if that turns out to be the case but if his drain output decreases substantially and you feel it is safe to remove the JP drain then certainly just let me know.  I am not sure if he will also potentially need a pancreatic ERCP if the pancreas duct is disrupted now with the significant necrosis as he has but we will have to see what happens. Sometimes we do not know this until we remove the cyst gastrostomy stent.  Hope that helps.  My nurse is working on arranging the follow-up in clinic with either myself or one of our APP's in the CT scan. He already has a follow-up with me in a few weeks for the stent pull.  Thanks.  GM

## 2020-11-25 NOTE — Telephone Encounter (Signed)
-----   Message from Alfredia Ferguson, PA-C sent at 11/24/2020 11:38 AM EDT ----- Regarding: Follow up CT , office follow up Patient is being discharged from the hospital today, follow-up CT is scheduled for 1 week. Just FYI so that you have him on your radar to follow-up the CT scan which is scheduled in 1 week and get him office follow-up.  Thank you

## 2020-11-25 NOTE — Telephone Encounter (Signed)
Mansouraty, Telford Nab., MD  Alfredia Ferguson PA-C; Timothy Lasso, RN Sole Lengacher,  Keep the current clinic visit and procedure visit in place with me in June.  I think he would benefit from an APP visit in the week after his CT scan to see where things stand.  CT scan under my name.  He can be seen by any of the APPs and then they can discuss his care with me.  Thanks.  GM

## 2020-11-25 NOTE — Telephone Encounter (Signed)
Amy I do not see where a CT scan has been scheduled.  Is he having it outside the system?

## 2020-11-26 NOTE — Discharge Summary (Signed)
[facility] Discharge Summary   Patient: Steve Romero ENI:[id number]  PCP: [name], DO  Date of admission: 11/01/2020   Date of discharge: 11/24/2020      Discharge Diagnoses:  Principal diagnosis Acute pancreatitis with necrosis Principal Problem:   Disorder of bile duct stent Active Problems:   Pancreatic necrosis   Elevated LFTs   Ascending cholangitis   Protein-calorie malnutrition, severe   Bile leak   Admitted From: Home Disposition:  Home with home health  Recommendations for Outpatient Follow-up:  1. PCP: Follow-up with PCP in 1 week.  Follow-up with GI in 1 week.  Follow-up with general surgery as recommended.  Follow-up with ID in 3 weeks 2. Follow up LABS/TEST: CT abdomen pelvis.   Follow-up Information    Virl Cagey, MD Follow up.   Specialty: General Surgery Why: call for appointment for management of JP drain Contact information: [address] Linna Hoff [facility] [address] [place], [place], DO. Schedule an appointment as soon as possible for a visit in 1 week(s).   Specialty: Family Medicine Contact information: [place] [address] [phone]        Carlyle Basques, MD. Schedule an appointment as soon as possible for a visit in 3 week(s).   Specialty: Infectious Diseases Contact information: [place] [address] [phone]        Mansouraty, Telford Nab., MD. Schedule an appointment as soon as possible for a visit in 1 week(s).   Specialties: Gastroenterology, Internal Medicine Contact information: [place] [address] [phone]        [facility] Follow up.   Why: Rolling walker to deliver to the room prior to transition home.  Contact information: [place] [address] [place] Follow up.   Why: Registered Nurse for IV antibiotic therapy-office to call with  visit times.              Discharge Instructions    [facility] pharmacist to adjust dose for Vancomycin, Aminoglycosides and other anti-infective therapies as requested by physician.   Complete by: As directed    [facility] to provide Cath Flo 58m   Complete by: As directed    Administer for PICC line occlusion and as ordered by physician for other access device issues.   Anaphylaxis Kit: Provided to treat any anaphylactic reaction to the medication being provided to the patient if First Dose or when requested by physician   Complete by: As directed    Epinephrine 1754mml vial / amp: Administer 0.54m60m0.54ml109mubcutaneously once for moderate to severe anaphylaxis, nurse to call physician and pharmacy when reaction occurs and call 911 if needed for immediate care   Diphenhydramine 50mg24mIV vial: Administer 25-50mg 154mM PRN for first dose reaction, rash, itching, mild reaction, nurse to call physician and pharmacy when reaction occurs   Sodium Chloride 0.9% NS 500ml I1mdminister if needed for hypovolemic blood pressure drop or as ordered by physician after call to physician with anaphylactic reaction   Change dressing on IV access line weekly and PRN   Complete by: As directed    Diet - low sodium heart healthy   Complete by: As directed    Flush IV access with Sodium Chloride 0.9% and Heparin 10 units/ml or 100 units/ml   Complete by: As directed  Home infusion instructions - [facility]   Complete by: As directed    Instructions: Flush IV access with Sodium Chloride 0.9% and Heparin 10units/ml or 100units/ml   Change dressing on IV access line: Weekly and PRN   Instructions Cath Flo 89m: Administer for PICC Line occlusion and as ordered by physician for other access device   [facility] pharmacist to adjust dose for: Vancomycin, Aminoglycosides and other anti-infective therapies as requested by physician   Increase activity slowly    Complete by: As directed    Method of administration may be changed at the discretion of home infusion pharmacist based upon assessment of the patient and/or caregiver's ability to self-administer the medication ordered   Complete by: As directed    No wound care   Complete by: As directed       Diet recommendation: Regular diet  Activity: The patient is advised to gradually reintroduce usual activities, as tolerated  Discharge Condition: stable  Code Status: Full code   History of present illness: As per the H and P dictated on admission, "Steve Ceramiis a 50y.o. male with medical history significant for COPD, bipolar disorder, schizoaffective disorder and depression.  [facility].   In the ED 3/15 diagnosed with cholelithiasis and discharged to follow-up as outpatient with general surgery, subsequently patient underwent laparoscopic cholecystectomy 4/6 with postop diagnosis of empyema of the gallbladder with a bile leak. ( He also had a reticular skin lesion/an abdominal wall mass, which was biopsied with pathology results showing underlying benign lipoma).  Patient was suspected to have a biliary leak,  had a JP drain placed, and GI was consulted for ERCP for diagnostic and therapeutic purposes. He underwent ERCP 4/7 by Dr. RLaural Goldenwith stent placement.  He had postop ERCP pancreatitis but was ultimately discharged on 4/12. Patient followed up subsequently as outpatient, 4/28, patient's white count was 18, subsequent CT 4/29 showed necrotic area of the pancreatic head with some mass-effect on biliary system with increasing bilirubin.  Patient was hospitalized, placed on empiric meropenem.  Diet was restarted and lab work improved-with improving liver enzymes..  Plan was to get him transferred over the weekend, but with his improvement opted for outpatient referral. 5/2, patient developed a fever with worsening liver enzymes,  worsening leukocytosis and bilirubin suggesting cholangitis. Repeat CT- 5/3-subacute necrotizing pancreatitis, large irregular necrotic fluid collection replacing much of the pancreatic head neck and body, mildly increased from prior study 4/29.  Stable position of biliary stents.  Moderate diffuse intrahepatic biliary ductal dilatation similar to slightly worse from prior study.  [facility].  Dr. BConstance Hawtalked to Dr. MRush Landmark who graciously accepted patient in transfer under hospitalist service."  Hospital Course:  Recent History: 10/09/20 cholecystectomy complicated by postop bile leak due to torn cystic duct that was clipped. JP drain placed. 10/10/20 ERCP with sphincterotomy and placement of plastic stent. Developed post ERCP pancreatitis. Plan was for repeat ERCP and likely stent removal in 6 weeks.  Discharged after 4/6 - 4/13 admission.  4/29 Readmitted from surgeons's office. Pt has developed large, complex fluid collection at the pancreatic head, uncinate, proximal body. 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 The necrotic collection is causing mass-effect on the gastric antrum/pylorus/proximal duodenum with reactive small bowel thickening. Stentin stable position. Intrapelvic bile duct dilation is  mild to moderate likely due to mass-effect from the fluid collection. There is fluid edema surrounding the pancreas extending into the retroperitoneum and mesentery. Reactive thickening in the colon.  Significant Events:  5/5 ERCP / EUS - see Op Note findings.  5/6 LFT's improved, jaundice resolved 5/7 ERCP - see op note. Prior stent accessible now and new one placed. Partial necrosectomy 5/9 ERCP - necrosectomy and replacement of stent 5/12 ERCP with necrosectomy Summary of his active problems in the hospital is  as following.   Bile leak status post cholecystectomy 4/6with JP drain placement Post-ERCP Pancreatitis with necrotic fluid collection 4/7 JP drain in place and will be followed up by Dr. Constance Haw after discharge. Initially admitted at [facility]. General surgery there Dr. Constance Haw d/w Dr. Rush Landmark who advised transfer [name] for possible advanced ERCP. He has had multiple ERCP with necrosectomy Currently on soft diet He was treated with a prolonged course of meropenem, but has since been transitioned to Augmentin, now back on IV meropenem and plan is to discharge home with meropenem with a PICC line. RD consult for supplement recs Pain control, antiemetics PRN Cultures from pancreatic cyst fluid shows moderate candida albicans Continue on diflucan appreciate ID assistance plan is to continue on augmentin and diflucan foran additional 3weeks until he can follow up with GI to have repeat ERCP and possible stent removal Repeat CT scan on 5/18 continues to show a phlegmon.  GI recommended IR evaluation but currently IR recommending repeat CT in 48 hours to ensure that the phlegmon organizes.  Per ID patient will be discharged on meropenem. Current plan is to perform CT scan sometime next week, continue IV antibiotics with meropenem and arrange for home health with outpatient follow-up with GI, general surgery as well as ID.  Leukocytosis Now stable.  Monitor.  Bilateral lower extremity edema Intermittently treated with IV Lasix 40 mg Monitor renal function and electrolytes Duplex U/S LE's negative for DVT D-dimer elevated, but suspect that is related to pancreatitis Echo last month poor study, EF >75%, no mention of diastolic findings Suspect hypoalbuminemia from poor po intake contributing to anasarca Started on albumin infusions with lasix overall LE swelling is markedly improved.   COPD stable, without no exacerbation sx's.   Schizoaffective disorder,  Depression stable. resume Risperdal, trazodone  Obesity: Body mass index is 38.19 kg/m. Complicates overall care and prognosis. Recommend lifestyle modifications including physical activity and diet for weight loss and overall long-term health.  Severe protein calorie malnutrition. Placing the patient at high risk of poor outcome. Body mass index is 38.19 kg/m.  Nutrition Problem: Severe Malnutrition Etiology: acute illness Nutrition Interventions: Interventions: Ensure Enlive (each supplement provides 350kcal and 20 grams of protein),Magic cup,MVI      Pain control  - [place] Controlled Substance Reporting System database was reviewed. - 5 day supply was provided. - Patient was instructed, not to drive, operate heavy machinery, perform activities at heights, swimming or participation in water activities or provide baby sitting services while on Pain, Sleep and Anxiety Medications; until his outpatient Physician has advised to do so again.  - Also recommended to not to take more than prescribed Pain, Sleep and Anxiety Medications.  Patient was seen by physical therapy, who recommended Home Health. On the day of the discharge the patient's vitals were stable, and no other new acute medical condition were reported. The patient was felt safe to be discharge at Home with Home health.  Consultants: GI, IR, ID Procedures: ERCP  with necrosectomy  DISCHARGE MEDICATION: Allergies as of 11/24/2020      Reactions   Desipramine Nausea Only, Rash      Medication List    STOP taking these medications   amoxicillin-clavulanate 875-125 MG tablet Commonly known as: Augmentin     TAKE these medications   acetaminophen 325 MG tablet Commonly known as: TYLENOL Take 650 mg by mouth every 6 (six) hours as needed.   cyclobenzaprine 10 MG tablet Commonly known as: FLEXERIL Take 0.5 tablets (5 mg total) by mouth 3 (three) times daily as needed for muscle spasms.   docusate  sodium 100 MG capsule Commonly known as: COLACE Take 1 capsule (100 mg total) by mouth 2 (two) times daily as needed for mild constipation (while taking narcotics). What changed: Another medication with the same name was added. Make sure you understand how and when to take each.   docusate sodium 100 MG capsule Commonly known as: Colace Take 1 capsule (100 mg total) by mouth 2 (two) times daily. What changed: You were already taking a medication with the same name, and this prescription was added. Make sure you understand how and when to take each.   feeding supplement Liqd Take 237 mLs by mouth 3 (three) times daily between meals.   fluconazole 200 MG tablet Commonly known as: DIFLUCAN Take 2 tablets (400 mg total) by mouth daily for 21 days.   Gerhardt's butt cream Crea Apply 1 application topically daily.   meropenem  IVPB Commonly known as: MERREM Inject 1 g into the vein every 8 (eight) hours for 21 days. Indication:  necrotizing pancreatitis with infected pseduocyst and fluid collection First Dose: No Last Day of Therapy:  12/13/20 Labs - Once weekly:  CBC/D and BMP, Labs - Every other week:  ESR and CRP Method of administration: Mini-Bag Plus / Gravity Method of administration may be changed at the discretion of home infusion pharmacist based upon assessment of the patient and/or caregiver's ability to self-administer the medication ordered.   ondansetron 4 MG disintegrating tablet Commonly known as: Zofran ODT Take 1 tablet (4 mg total) by mouth every 8 (eight) hours as needed for nausea or vomiting.   oxyCODONE 5 MG immediate release tablet Commonly known as: Oxy IR/ROXICODONE Take 1-2 tablets (5-10 mg total) by mouth every 4 (four) hours as needed for severe pain or breakthrough pain.   polyethylene glycol 17 g packet Commonly known as: MIRALAX / GLYCOLAX Take 17 g by mouth daily.   risperiDONE 1 MG tablet Commonly known as: RISPERDAL Take 1 tablet (1 mg total) by  mouth at bedtime. Further fills to psychiatry   traZODone 150 MG tablet Commonly known as: DESYREL Take 1-2 tablets (150-300 mg total) by mouth at bedtime. Further fills per psychiatry            Discharge Care Instructions  (From admission, onward)         Start     Ordered   11/23/20 0000  Change dressing on IV access line weekly and PRN  (Home infusion instructions - [facility] )        11/23/20 1442          Discharge Exam: Filed Weights   11/06/20 0109 11/08/20 0340 11/09/20 0353  Weight: 120.3 kg 118.9 kg 119 kg   Vitals:   11/24/20 0306 11/24/20 0900  BP: 126/75 129/75  Pulse: 100 100  Resp: 17 18  Temp: 99.3 F (37.4 C) 99.7 F (37.6 C)  SpO2:  99% 100%   General: Appear in mild distress, no Rash; Oral Mucosa Clear, moist. no Abnormal Neck Mass Or lumps, Conjunctiva normal  Cardiovascular: S1 and S2 Present, no Murmur, Respiratory: good respiratory effort, Bilateral Air entry present and CTA, no Crackles, no wheezes Abdomen: Bowel Sound present, Soft and distended, no tenderness Extremities: Bilateral pedal edema Neurology: alert and oriented to time, place, and person affect appropriate. no new focal deficit Gait not checked due to patient safety concerns    The results of significant diagnostics from this hospitalization (including imaging, microbiology, ancillary and laboratory) are listed below for reference.    Significant Diagnostic Studies: DG Abd 1 View  Result Date: 11/10/2020 CLINICAL DATA:  Abdominal pain and distension status post ERCP with necrosectomy and placement of dual pigtail catheters across cystgastrostomy stent EXAM: ABDOMEN - 1 VIEW COMPARISON:  CT abdomen/pelvis 11/05/2020 FINDINGS: Two images were used to encompass the majority of the abdomen. No obvious free air noted on these limited views. No evidence of bowel obstruction. Nonspecific bowel gas pattern with gas scattered throughout loops of small and large bowel.  Limited evaluation of previously placed endoscopic prostheses. There appears to be both a biliary stent and 2 pigtail catheters traversing between the duodenum and region of the known pancreatic pseudocyst. IMPRESSION: Limited abdominal radiographs demonstrate no convincing evidence of free air or bowel obstruction. Electronically Signed   By: Jacqulynn Cadet M.D.   On: 11/10/2020 12:16   CT ANGIO CHEST PE W OR WO CONTRAST  Result Date: 11/12/2020 CLINICAL DATA:  PE suspected, shortness of breath EXAM: CT ANGIOGRAPHY CHEST WITH CONTRAST TECHNIQUE: Multidetector CT imaging of the chest was performed using the standard protocol during bolus administration of intravenous contrast. Multiplanar CT image reconstructions and MIPs were obtained to evaluate the vascular anatomy. CONTRAST:  67m OMNIPAQUE IOHEXOL 350 MG/ML SOLN COMPARISON:  None. FINDINGS: Cardiovascular: Examination for pulmonary embolism is somewhat limited by breath motion artifact, particularly in the lung bases. Within this limitation, no evidence of pulmonary embolism through the proximal segmental pulmonary arterial level. No evidence of pulmonary embolism. Normal heart size. No pericardial effusion. Mediastinum/Nodes: No enlarged mediastinal, hilar, or axillary lymph nodes. Thyroid gland, trachea, and esophagus demonstrate no significant findings. Lungs/Pleura: Bandlike scarring and or atelectasis of the bilateral lung bases. No pleural effusion or pneumothorax. Upper Abdomen: No acute abnormality. Musculoskeletal: No chest wall abnormality. No acute or significant osseous findings. Review of the MIP images confirms the above findings. IMPRESSION: 1. Examination for pulmonary embolism is somewhat limited by breath motion artifact, particularly in the lung bases. Within this limitation, no evidence of pulmonary embolism through the proximal segmental pulmonary arterial level. 2. Bandlike scarring and/or atelectasis of the bilateral lung bases.  Electronically Signed   By: AEddie CandleM.D.   On: 11/12/2020 16:30   CT PELVIS W CONTRAST  Result Date: 11/05/2020 CLINICAL DATA:  Inpatient. Laparoscopic cholecystectomy for gallbladder empyema 10/09/2020 with postoperative bile leak treated with biliary stent placement [id number] complicated by necrotizing pancreatitis. Fever. Right upper quadrant abdominal pain. EXAM: CT ABDOMEN AND PELVIS WITHOUT AND WITH CONTRAST TECHNIQUE: Multidetector CT imaging of the abdomen and pelvis was performed following the standard protocol before and following the bolus administration of intravenous contrast. CONTRAST:  1062mOMNIPAQUE IOHEXOL 300 MG/ML  SOLN COMPARISON:  11/01/2020 CT abdomen/pelvis. FINDINGS: Lower chest: Moderate platelike bibasilar atelectasis. Hepatobiliary: Normal liver size. No liver masses. Cholecystectomy. Percutaneous right sided surgical drain terminates in the anterior cholecystectomy bed. Mild-to-moderate diffuse intrahepatic biliary ductal dilatation appears similar  to slightly worsened since 11/01/2020 CT. Stable position of biliary stent extending from the proximal common bile duct into the descending duodenal lumen. Somewhat abrupt caliber transition of the extrahepatic bile ducts just below the proximal tip of the CBD stent, unchanged. No radiopaque choledocholithiasis. No pneumobilia. Pancreas: Marked diffuse peripancreatic fat stranding and ill-defined fluid with diffuse thickening of the pancreatic parenchyma, compatible with subacute severe pancreatitis, not substantially changed. Large irregular fluid collection replacing much of the pancreatic head, neck and body measuring up to 15.4 x 9.6 x 8.2 cm (series 4/image 65), previously 14.4 x 9.5 x 7.1 cm on 11/01/2020 CT using similar measurement technique, mildly increased, with associated mass-effect on the intrapancreatic portion of the common bile duct. Additional poorly defined inflammatory fluid collection in transverse mesocolon  measuring 6.7 x 5.5 cm (series 4/image 70), previously 5.9 x 5.1 cm using similar measurement technique, mildly increased. Spleen: Normal size. No mass. Adrenals/Urinary Tract: Normal adrenals. No hydronephrosis. No renal masses. No renal stones. Normal bladder. Stomach/Bowel: Stomach is nondistended. Persistent reactive wall thickening in the gastric antrum and throughout the duodenum, not substantially changed. Normal caliber small bowel with no small bowel wall thickening. Gas is seen within the appendix, located adjacent to the inferior right liver, with surrounding inflammatory fluid, likely reactive. Similar reactive wall thickening throughout the hepatic and splenic flexures of the colon and transverse colon. No significant colonic diverticulosis. Vascular/Lymphatic: Normal caliber abdominal aorta. Patent splenic, renal and hepatic veins. Similar extrinsic mass-effect on the main portal vein and proximal right and left portal veins, which remain patent. No pathologically enlarged lymph nodes in the abdomen or pelvis. Reproductive: Normal size prostate. Other: No pneumoperitoneum, ascites or focal fluid collection. Musculoskeletal: No aggressive appearing focal osseous lesions. Moderate lumbar spondylosis. IMPRESSION: 1. Subacute necrotizing pancreatitis. Large irregular necrotic fluid collection replacing much of the pancreatic head, neck and body, mildly increased in size since 11/01/2020 CT, with associated mass-effect on the intrapancreatic portion of the common bile duct. Additional poorly defined inflammatory fluid collection in the transverse mesocolon is mildly increased. 2. Stable position of biliary stent. Mild-to-moderate diffuse intrahepatic biliary ductal dilatation appears similar to slightly worsened since 11/01/2020 CT. 3. Stable reactive wall thickening in the gastric antrum, duodenum, hepatic and splenic flexures of the colon and transverse colon. 4. Moderate platelike bibasilar atelectasis.  Electronically Signed   By: Ilona Sorrel M.D.   On: 11/05/2020 13:02   CT ABDOMEN PELVIS W CONTRAST  Result Date: 11/20/2020 CLINICAL DATA:  Abdominal pain, fever, known pancreatitis EXAM: CT ABDOMEN AND PELVIS WITH CONTRAST TECHNIQUE: Multidetector CT imaging of the abdomen and pelvis was performed using the standard protocol following bolus administration of intravenous contrast. CONTRAST:  59m OMNIPAQUE IOHEXOL 300 MG/ML  SOLN COMPARISON:  CT 11/13/2020 FINDINGS: Lower chest: Bandlike subsegmental atelectasis or scarring in the lung bases. Normal heart size. No pericardial effusion. Hepatobiliary: No concerning focal liver lesion. Smooth surface contour. Normal hepatic attenuation. Prior cholecystectomy. Surgical drain remains in place terminating at the gallbladder fossa, unchanged position from comparison exam. Persistent intra and extrahepatic biliary ductal dilatation despite the presence of paired common bile duct stents terminating in the extrahepatic common bile duct proximally and duodenum distally. These traverse the vicinity of a complex pancreatic collection further described below. No visible intraductal gallstones. Pancreas: Irregular, patchy enhancement of the pancreas, particularly with hypoattenuation involving the pancreatic head, uncinate and proximal body. Extensive heterogeneous and edematous changes throughout the pancreatic parenchyma with extensive surrounding peripancreatic fluid and phlegmon. A more focal air  and fluid containing collection is seen at the level of the pancreatic head and uncinate measuring 4.9 x 3.6 by 5.8 cm in size appearing to extend along the pancreatic duct. There is evidence of of surgical gastrocystostomy stent between the gastric wall and collection along the pancreatic head and proximal body which is traversed by a small pigtail catheter terminating approximately in the gastric lumen and distally within the lateral portion of this persisting collection.  Additional adjacent ill-defined pancreatic phlegmon versus collection measuring approximately 9 x 9.4 cm (3/40) not significantly changed from comparison when measured at similar levels. This extends along portion of the transverse colon likely with some reactive thickening. Additional extensive inflammatory phlegmon centered in the upper mesentery and retroperitoneum. No definite new collection or abscess is seen. Spleen: Normal in size. No concerning splenic lesions. Adrenals/Urinary Tract: No concerning adrenal nodules. Kidneys are normally located with symmetric enhancement and excretion. No suspicious renal lesion, urolithiasis or hydronephrosis. Urinary bladder is unremarkable for the degree of distention. Stomach/Bowel: Distal esophagus unremarkable. Cysto gastrostomy noted, as detailed above. Extensive circumferential thickening of the duodenum, much of which is favored to be reactive. Several loops of small bowel and portion of the transverse colon adjacent the heterogeneous peripancreatic phlegmon and fluid collections also likely reactive. No resulting bowel obstruction is seen at this time. The appendix is not well visualized. No extravasation of high attenuation enteric contrast media. Vascular/Lymphatic: No significant vascular findings are present. Suboptimal opacification of the portal and hepatic veins to assess for patency. Clustered reactive appearing upper mesenteric nodes are again seen (3/50-55). No new enlarged or enlarging lymph nodes are present. Reproductive: The prostate and seminal vesicles are unremarkable. Other: Edematous changes of the umbilicus. No bowel containing hernia. Fat containing right inguinal hernia. No bowel containing hernia. Mild posterior body wall edema. Musculoskeletal: Multilevel degenerative changes are present in the imaged portions of the spine. Features most pronounced L3-4. Bilateral L3 pars defects noted as well. Mild degenerative changes in hips and pelvis.  IMPRESSION: Redemonstrated features of persistent severe necrotizing pancreatitis with extensive surrounding peripancreatic fluid and phlegmon as well as a more central region walled off necrosis which appears partially decompressed by a cystogastrostomy which is traversed by a small pigtail drainage catheter though this is notably along the far peripheral aspect of the pancreatic collection and may not adequately drain this collection given complexity and persistence from comparison imaging. More ill-defined phlegmon noted throughout the mid mesentery and retroperitoneum resulting in what is likely reactive thickening across multiple loops of small bowel, transverse colon and much of the duodenal sweep. Persistent biliary ductal dilatation despite the presence of 2 common bile duct stents which are positioned proximally in the extrahepatic common bile duct and distally within the duodenal lumen. Postsurgical changes from cholecystectomy with surgical drain at the level of the gallbladder fossa, unchanged prior. Electronically Signed   By: Lovena Le M.D.   On: 11/20/2020 22:23   CT ABDOMEN PELVIS W CONTRAST  Result Date: 11/13/2020 CLINICAL DATA:  Acute severe pancreatitis. EXAM: CT ABDOMEN AND PELVIS WITH CONTRAST TECHNIQUE: Multidetector CT imaging of the abdomen and pelvis was performed using the standard protocol following bolus administration of intravenous contrast. CONTRAST:  32m OMNIPAQUE IOHEXOL 300 MG/ML  SOLN COMPARISON:  CT angio chest 11/12/2020, CT abdomen 11/05/2020 FINDINGS: Lower chest: Bilateral lower lobe linear atelectasis versus scarring. Bronchial wall thickening. Hepatobiliary: No focal liver abnormality. Status post cholecystectomy. Mild pneumobilia noted centrally on the left. Slightly improved mild intrahepatic biliary ductal dilatation on  the left (5:45). Common bile duct stent in grossly appropriate position. Redemonstration of a gallbladder fossa surgical drain. Pancreas: Similar  marked diffuse haziness of the pancreatic parenchyma, parenchymal edema, and peripancreatic fat stranding/free fluid. Again noted is poor opacification of the pancreatic parenchyma. Status post gastropancreatic pseudocyst stent with interval decrease in size of an approximately 5 cm (from 11 x 7 cm) proximal intraparenchymal pancreatic lesion now containing fluid and gas. This pseudocyst is again noted to be irregular in contour. Redemonstration of a very ill-defined poorly organized fluid collection within the transverse mesocolon (3:43) that measures approximately 9 cm in diameter. No definite new organized fluid collection. Spleen: Normal in size without focal abnormality. Adrenals/Urinary Tract: No adrenal nodule bilaterally. Bilateral kidneys enhance symmetrically. No hydronephrosis. No hydroureter. The urinary bladder is unremarkable. Stomach/Bowel: Stomach is within normal limits. No evidence of bowel wall thickening or dilatation. Stool throughout the colon. The appendix not definitely identified. Vascular/Lymphatic: No significant vascular findings are present. Multiple prominent mesenteric lymph nodes that are likely reactive in etiology. No enlarged abdominal or pelvic lymph nodes. Reproductive: Prostate is unremarkable. Other: No intraperitoneal free gas. Musculoskeletal: Interval increase in subcutaneus soft tissue edema. No suspicious lytic or blastic osseous lesions. No acute displaced fracture. Degenerative changes at the L3-L4 level. IMPRESSION: 1. Persistent severe subacute necrotizing pancreatitis status post gastropancreatic pseudocyst stent placement with interval decrease in size of an approximately 5 cm (from 11 x 7 cm) intraparenchymal pancreatic lesion. 2. Interval slightly improved left intrahepatic biliary ductal dilatation. 3. Persistently very ill-defined and poorly organized approximately 9 cm in caliber fluid collection within the transverse mesocolon. 4. Status post cholecystectomy  with gallbladder fossa surgical drain in similar position. 5. Stool throughout the colon. Electronically Signed   By: Iven Finn M.D.   On: 11/13/2020 06:36   CT Abdomen Pelvis W Contrast  Result Date: 11/01/2020 CLINICAL DATA:  Intra-abdominal abscess biliary leak, with drain and stent. RIGHT-sided abdominal pain for 3 weeks. EXAM: CT ABDOMEN AND PELVIS WITH CONTRAST TECHNIQUE: Multidetector CT imaging of the abdomen and pelvis was performed using the standard protocol following bolus administration of intravenous contrast. CONTRAST:  112m OMNIPAQUE IOHEXOL 300 MG/ML  SOLN COMPARISON:  CT abdomen dated 10/13/2020. FINDINGS: Lower chest: Mild bibasilar scarring/atelectasis. Hepatobiliary: Biliary stent is in place, stable in position extending from the mid CBD to the duodenum. There is new intrahepatic bile duct dilatation, mild to moderate in degree. Cholecystectomy clips in place. Surgical drain is stable in position extending to the superior-anterior margin of the gallbladder fossa. No focal liver abnormality is seen. 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 Contiguous component extending into the anterior mesentery measures approximately 7 x 5 cm (series 2, image 43). Additional smaller contiguous component extends inferior to the uncinate process (series 2, image 54) Additional ill-defined fluid/edema is again seen about the entire pancreas and extending into the underlying retroperitoneum and adjacent mesentery. Spleen: Normal in size without focal abnormality. Adrenals/Urinary Tract: Adrenal glands appear normal. Kidneys are unremarkable without mass, stone or hydronephrosis. Bladder is  unremarkable, partially decompressed. Stomach/Bowel: No dilated large or small bowel loops. Reactive thickening of the walls of the transverse, descending and ascending colon. The acute necrotic collection within the pancreas is causing mass effect  on the adjacent gastric antrum/pylorus and proximal duodenum with probable associated reactive bowel wall thickening. No associated bowel obstruction. Vascular/Lymphatic: No acute appearing vascular abnormality is identified. No pathologically enlarged lymph nodes are identified. Reproductive: Prostate is unremarkable. Other: No free intraperitoneal air. Musculoskeletal: No acute appearing osseous abnormality. Degenerative spondylosis at the L3-4 level, moderate in degree with associated disc space narrowing and articular surface sclerosis. 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 Small RIGHT inguinal hernia which contains fat only. IMPRESSION: 1. New large complex fluid collection involving the pancreatic head, uncinate process and proximal pancreatic body, measuring up to 18 cm greatest dimension, compatible with acute necrotic collection secondary to an underlying necrotizing pancreatitis. Additional contiguous fluid collections extend into the anterior mesentery (measuring approximately 7 cm greatest dimension) and inferior to the uncinate process (measuring approximately 3.5 cm greatest dimension). 2. Additional ill-defined fluid/edema about the entire pancreas and extending into the underlying retroperitoneum and adjacent mesentery, again compatible with underlying pancreatitis and presumably necrotizing pancreatitis. 3. Biliary stent is in place, stable in position extending from the mid CBD to the duodenum. However, there is new intrahepatic bile duct dilatation that is mild to moderate in degree, likely due to mass effect on the stent by the adjacent pancreatic fluid collection. 4. Reactive thickening of the walls of the  transverse, descending and ascending colon. 5. Significant mass effect on the stomach and proximal small bowel by the large pancreatic fluid collection, likely with associated reactive bowel wall thickening. No evidence of associated bowel obstruction. 6. Additional chronic/incidental findings detailed above. These results will be called to the ordering clinician or representative by the Radiologist Assistant, and communication documented in the PACS or [employer]. Electronically Signed   By: Franki Cabot M.D.   On: 11/01/2020 13:58   CT ABDOMEN W WO CONTRAST  Result Date: 11/05/2020 CLINICAL DATA:  Inpatient. Laparoscopic cholecystectomy for gallbladder empyema 10/09/2020 with postoperative bile leak treated with biliary stent placement 03/83/3383, complicated by necrotizing pancreatitis. Fever. Right upper quadrant abdominal pain. EXAM: CT ABDOMEN AND PELVIS WITHOUT AND WITH CONTRAST TECHNIQUE: Multidetector CT imaging of the abdomen and pelvis was performed following the standard protocol before and following the bolus administration of intravenous contrast. CONTRAST:  167m OMNIPAQUE IOHEXOL 300 MG/ML  SOLN COMPARISON:  11/01/2020 CT abdomen/pelvis. FINDINGS: Lower chest: Moderate platelike bibasilar atelectasis. Hepatobiliary: Normal liver size. No liver masses. Cholecystectomy. Percutaneous right sided surgical drain terminates in the anterior cholecystectomy bed. Mild-to-moderate diffuse intrahepatic biliary ductal dilatation appears similar to slightly worsened since 11/01/2020 CT. Stable position of biliary stent extending from the proximal common bile duct into the descending duodenal lumen. Somewhat abrupt caliber transition of the extrahepatic bile ducts just below the proximal tip of the CBD stent, unchanged. No radiopaque choledocholithiasis. No pneumobilia. Pancreas: Marked diffuse peripancreatic fat stranding and ill-defined fluid with diffuse thickening of the pancreatic parenchyma,  compatible with subacute severe pancreatitis, not substantially changed. Large irregular fluid collection replacing much of the pancreatic head, neck and body measuring up to 15.4 x 9.6 x 8.2 cm (series 4/image 65), previously 14.4 x 9.5 x 7.1 cm on 11/01/2020 CT using similar measurement technique, mildly increased, with associated mass-effect on the intrapancreatic portion of the common bile duct. Additional poorly defined inflammatory fluid collection in transverse mesocolon measuring 6.7 x 5.5 cm (series 4/image 70), previously 5.9 x 5.1 cm using similar measurement technique, mildly increased. Spleen: Normal size. No mass. Adrenals/Urinary Tract: Normal adrenals. No hydronephrosis. No renal masses. No renal stones.  Normal bladder. Stomach/Bowel: Stomach is nondistended. Persistent reactive wall thickening in the gastric antrum and throughout the duodenum, not substantially changed. Normal caliber small bowel with no small bowel wall thickening. Gas is seen within the appendix, located adjacent to the inferior right liver, with surrounding inflammatory fluid, likely reactive. Similar reactive wall thickening throughout the hepatic and splenic flexures of the colon and transverse colon. No significant colonic diverticulosis. Vascular/Lymphatic: Normal caliber abdominal aorta. Patent splenic, renal and hepatic veins. Similar extrinsic mass-effect on the main portal vein and proximal right and left portal veins, which remain patent. No pathologically enlarged lymph nodes in the abdomen or pelvis. Reproductive: Normal size prostate. Other: No pneumoperitoneum, ascites or focal fluid collection. Musculoskeletal: No aggressive appearing focal osseous lesions. Moderate lumbar spondylosis. IMPRESSION: 1. Subacute necrotizing pancreatitis. Large irregular necrotic fluid collection replacing much of the pancreatic head, neck and body, mildly increased in size since 11/01/2020 CT, with associated mass-effect on the  intrapancreatic portion of the common bile duct. Additional poorly defined inflammatory fluid collection in the transverse mesocolon is mildly increased. 2. Stable position of biliary stent. Mild-to-moderate diffuse intrahepatic biliary ductal dilatation appears similar to slightly worsened since 11/01/2020 CT. 3. Stable reactive wall thickening in the gastric antrum, duodenum, hepatic and splenic flexures of the colon and transverse colon. 4. Moderate platelike bibasilar atelectasis. Electronically Signed   By: Ilona Sorrel M.D.   On: 11/05/2020 13:02   DG Chest Port 1 View  Result Date: 11/04/2020 CLINICAL DATA:  Fever EXAM: PORTABLE CHEST 1 VIEW COMPARISON:  Report 11/20/2014, CT 11/01/2020 FINDINGS: Subsegmental atelectasis at the bases. No pleural effusion. Stable cardiomediastinal silhouette with prominent mediastinal fat noted on CT. No pneumothorax. IMPRESSION: Subsegmental atelectasis at the bases. Electronically Signed   By: Donavan Foil M.D.   On: 11/04/2020 18:49   DG ERCP  Result Date: 11/09/2020 CLINICAL DATA:  50 year old male with postoperative bile leak following laparoscopic cholecystectomy. ERCP and biliary stent placement was performed but was complicated by post ERCP pancreatitis, pancreatic necrosis and large pseudocyst formation. Mass-effect from the pseudo cyst is thought to be causing further biliary obstruction. EXAM: ERCP TECHNIQUE: Multiple spot images obtained with the fluoroscopic device and submitted for interpretation post-procedure. FLUOROSCOPY TIME:  See operative report for further detail. COMPARISON:  CT abdomen 11/05/2020 FINDINGS: Seven saved fluoro images are submitted for review. The images demonstrate a flexible endoscope in the descending duodenum. A plastic biliary stent is present. Subsequent images demonstrate wire access of the common bile duct adjacent to the previously placed stent. A second biliary stent is then placed. IMPRESSION: ERCP with placement of a  second biliary stent. These images were submitted for radiologic interpretation only. Please see the procedural report for the amount of contrast and the fluoroscopy time utilized. Electronically Signed   By: Jacqulynn Cadet M.D.   On: 11/09/2020 15:12   VAS [place] LOWER EXTREMITY VENOUS (DVT)  Result Date: 11/12/2020  Lower Venous DVT Study Patient Name:  Steve Romero  Date of Exam:   11/12/2020 Medical Rec #: [id number]       Accession #:    [id number] Date of Birth: [birth year]       Patient Gender: M Patient Age:   49Y Exam Location:  [facility] Procedure:      VAS [place] LOWER EXTREMITY VENOUS (DVT) Referring Phys: [id number] KELLY A GRIFFITH --------------------------------------------------------------------------------  Indications: Edema.  Comparison Study: No previous exams Performing Technologist: Rogelia Rohrer  Examination Guidelines: A complete evaluation includes B-mode imaging, spectral Doppler, color  Doppler, and power Doppler as needed of all accessible portions of each vessel. Bilateral testing is considered an integral part of a complete examination. Limited examinations for reoccurring indications may be performed as noted. The reflux portion of the exam is performed with the patient in reverse Trendelenburg.  +---------+---------------+---------+-----------+----------+--------------+ RIGHT    CompressibilityPhasicitySpontaneityPropertiesThrombus Aging +---------+---------------+---------+-----------+----------+--------------+ CFV      Full           Yes      Yes                                 +---------+---------------+---------+-----------+----------+--------------+ SFJ      Full                                                        +---------+---------------+---------+-----------+----------+--------------+ FV Prox  Full           Yes      Yes                                 +---------+---------------+---------+-----------+----------+--------------+ FV Mid   Full            Yes      Yes                                 +---------+---------------+---------+-----------+----------+--------------+ FV DistalFull           Yes      Yes                                 +---------+---------------+---------+-----------+----------+--------------+ PFV      Full                                                        +---------+---------------+---------+-----------+----------+--------------+ POP      Full           Yes      Yes                                 +---------+---------------+---------+-----------+----------+--------------+ PTV      Full                                                        +---------+---------------+---------+-----------+----------+--------------+ PERO     Full                                                        +---------+---------------+---------+-----------+----------+--------------+   +---------+---------------+---------+-----------+----------+--------------+ LEFT     CompressibilityPhasicitySpontaneityPropertiesThrombus Aging +---------+---------------+---------+-----------+----------+--------------+ CFV  Full           Yes      Yes                                 +---------+---------------+---------+-----------+----------+--------------+ SFJ      Full                                                        +---------+---------------+---------+-----------+----------+--------------+ FV Prox  Full           Yes      Yes                                 +---------+---------------+---------+-----------+----------+--------------+ FV Mid   Full           Yes      Yes                                 +---------+---------------+---------+-----------+----------+--------------+ FV DistalFull           Yes      Yes                                 +---------+---------------+---------+-----------+----------+--------------+ PFV      Full                                                         +---------+---------------+---------+-----------+----------+--------------+ POP      Full           Yes      Yes                                 +---------+---------------+---------+-----------+----------+--------------+ PTV      Full                                                        +---------+---------------+---------+-----------+----------+--------------+ PERO     Full                                                        +---------+---------------+---------+-----------+----------+--------------+     Summary: BILATERAL: - No evidence of deep vein thrombosis seen in the lower extremities, bilaterally. - No evidence of superficial venous thrombosis in the lower extremities, bilaterally. -No evidence of popliteal cyst, bilaterally.   *See table(s) above for measurements and observations. Electronically signed by Curt Jews MD on 11/12/2020 at 3:48:23 PM.    Final    [place] EKG SITE RITE  Result Date: 11/21/2020 If Site Rite image not attached, placement  could not be confirmed due to current cardiac rhythm.   Microbiology: No results found for this or any previous visit (from the past 240 hour(s)).   Labs: CBC: Recent Labs  Lab 11/20/20 0239 11/21/20 0502 11/22/20 0142 11/23/20 0307 11/24/20 0312  WBC 20.8* 17.0* 17.0* 17.2* 16.9*  NEUTROABS  --  13.8* 13.4* 13.5* 13.4*  HGB 10.0* 9.6* 10.1* 9.6* 9.7*  HCT 31.3* 29.7* 31.1* 30.0* 28.8*  MCV 93.7 91.7 92.3 91.5 90.6  PLT 447* 437* 438* 426* 747   Basic Metabolic Panel: Recent Labs  Lab 11/21/20 0502 11/22/20 0142 11/23/20 0307 11/24/20 0312  NA 132* 131* 131* 129*  K 4.4 4.2 4.3 4.0  CL 94* 93* 92* 92*  CO2 28 27 29 30   GLUCOSE 125* 139* 162* 130*  BUN 8 10 10 8   CREATININE 0.73 0.78 0.91 0.74  CALCIUM 8.5* 8.3* 8.4* 8.2*  MG 2.0  --  1.8  --    Liver Function Tests: Recent Labs  Lab 11/21/20 0502 11/22/20 0142 11/23/20 0307 11/24/20 0312  AST 30 33 28 27  ALT 30 35 37 35  ALKPHOS  173* 195* 170* 173*  BILITOT 0.8 0.5 0.7 1.0  PROT 6.7 6.9 7.0 6.5  ALBUMIN 2.1* 2.1* 2.0* 1.9*   CBG: No results for input(s): GLUCAP in the last 168 hours.  Time spent: 35 minutes  Signed:  Berle Mull  [facility] 11/24/2020 11:42 PM

## 2020-11-27 NOTE — Telephone Encounter (Signed)
The order has been entered for CT scan to be completed this week or first of next week. Appt made with Nicoletta Ba PA for 6/2 at 130 pm.  He will keep appt for follow up with GM on 6/21 at 950 am and procedure with GM on 6/23.  No answer and no voice mail

## 2020-11-28 ENCOUNTER — Telehealth (INDEPENDENT_AMBULATORY_CARE_PROVIDER_SITE_OTHER): Payer: Medicaid Other | Admitting: General Surgery

## 2020-11-28 DIAGNOSIS — K859 Acute pancreatitis without necrosis or infection, unspecified: Secondary | ICD-10-CM

## 2020-11-28 DIAGNOSIS — K8689 Other specified diseases of pancreas: Secondary | ICD-10-CM

## 2020-11-28 DIAGNOSIS — K810 Acute cholecystitis: Secondary | ICD-10-CM

## 2020-11-28 DIAGNOSIS — K9189 Other postprocedural complications and disorders of digestive system: Secondary | ICD-10-CM

## 2020-11-28 NOTE — Telephone Encounter (Signed)
No answer no voice mail  

## 2020-11-28 NOTE — Telephone Encounter (Signed)
[facility]  Mother called and wanted to speak with me.   Patient getting meropenem at home and is doing fine in the Am but is having evening fevers and it was mostly in the 99s but has been in the 100.6 range last night.  They are doing tylenol and ibuprofen.   HH is doing lab work and they should be doing it tomorrow.    CT scan was suppose to be Monday but it was not scheduled and now it is on 6/1 an he is seeing GI on 6/2.  He is refusing to take any pain medication and he is not talking much and not eating much.  He is drinking ensures X 3 each day when he is not eating much.  He will eat ice cream.   JP drain in place and draining. Will leave in place. Told her if fever curve worsening that she needs to take him to ED / call someone.   Curlene Labrum, MD [facility] [address] [place], [place] [address] [phone] (office)

## 2020-11-28 NOTE — Telephone Encounter (Signed)
[facility]  Called to check on patient but no answer on his phone.  Talked to his sister today.  Getting antibiotics and blood thinner via PICC line.  She says he gets a fever every night that is low grade. Home health RN coming out.   He is trying to eat and drinks ensures a few times. Pain is not horrible but has the hardest at night. He is getting his CT on Monday and seeing GI next week.   JP drain is filling up 2 to three times a day and is light yellow, no green / not bile. So likely 150+ cc.  Told her to please keep [place] updated, and will leave the drain in place for now.   Will see him in the upcoming weeks but he has several appts next week and CT scan.    Curlene Labrum, MD [facility] [address] [place], [place] [address] [phone] (office)

## 2020-12-03 ENCOUNTER — Telehealth (INDEPENDENT_AMBULATORY_CARE_PROVIDER_SITE_OTHER): Payer: Medicaid Other | Admitting: General Surgery

## 2020-12-03 DIAGNOSIS — K8689 Other specified diseases of pancreas: Secondary | ICD-10-CM

## 2020-12-03 DIAGNOSIS — K810 Acute cholecystitis: Secondary | ICD-10-CM

## 2020-12-03 DIAGNOSIS — K859 Acute pancreatitis without necrosis or infection, unspecified: Secondary | ICD-10-CM

## 2020-12-03 DIAGNOSIS — K9189 Other postprocedural complications and disorders of digestive system: Secondary | ICD-10-CM

## 2020-12-03 NOTE — Telephone Encounter (Signed)
No answer no voice mail  

## 2020-12-03 NOTE — Telephone Encounter (Signed)
[facility]  Mother Lucita Ferrara called office and asked me to call her back.  CT scan tomorrow planned in [place] but not sure how to do the contrast.   She says he is not doing well. He sleeps a lot and eats a little.  He looks weaker she thinks/ tired. He is getting a little depressed.   1 bottle 2hrs, 1 bottle 1 hr before and NPO 4 hrs before.    HH has not come to see the patient this weekend.    I am sending Dr. Lajuana Ripple a message about the depression given his history.  Curlene Labrum, MD [facility] [address] [place], [place] [address] [phone] (office)

## 2020-12-03 NOTE — Telephone Encounter (Signed)
Noted  

## 2020-12-04 ENCOUNTER — Other Ambulatory Visit: Payer: Self-pay

## 2020-12-04 ENCOUNTER — Ambulatory Visit (HOSPITAL_BASED_OUTPATIENT_CLINIC_OR_DEPARTMENT_OTHER)
Admission: RE | Admit: 2020-12-04 | Discharge: 2020-12-04 | Disposition: A | Discharge status: Home / Self Care | Payer: Medicaid Other | Source: Ambulatory Visit | Attending: Internal Medicine | Admitting: Internal Medicine

## 2020-12-04 DIAGNOSIS — K859 Acute pancreatitis without necrosis or infection, unspecified: Secondary | ICD-10-CM | POA: Diagnosis present

## 2020-12-04 DIAGNOSIS — K9189 Other postprocedural complications and disorders of digestive system: Secondary | ICD-10-CM | POA: Diagnosis not present

## 2020-12-04 MED ORDER — IOHEXOL 300 MG/ML  SOLN
75.0000 mL | Freq: Once | INTRAMUSCULAR | Status: AC | PRN
  Administered 2020-12-04: 75 mL via INTRAVENOUS

## 2020-12-04 NOTE — Telephone Encounter (Signed)
It was done today.

## 2020-12-04 NOTE — Telephone Encounter (Signed)
FYI the pt finally returned call and is aware of all instructions and appts.

## 2020-12-04 NOTE — Telephone Encounter (Signed)
I have tried to reach the pt numerous times with no answer and no voice mail.  I tried all available emergency contacts as well. No answer and no voice mail. All information was sent to My Chart however, the pt does not review My Chart messages.

## 2020-12-05 ENCOUNTER — Encounter: Payer: Self-pay | Admitting: Physician Assistant

## 2020-12-05 ENCOUNTER — Other Ambulatory Visit (INDEPENDENT_AMBULATORY_CARE_PROVIDER_SITE_OTHER): Payer: Medicaid Other

## 2020-12-05 ENCOUNTER — Ambulatory Visit: Payer: Medicaid Other | Admitting: Physician Assistant

## 2020-12-05 VITALS — BP 116/72 | HR 62 | Ht 70.5 in | Wt 234.8 lb

## 2020-12-05 DIAGNOSIS — K839 Disease of biliary tract, unspecified: Secondary | ICD-10-CM | POA: Diagnosis not present

## 2020-12-05 DIAGNOSIS — K8689 Other specified diseases of pancreas: Secondary | ICD-10-CM

## 2020-12-05 DIAGNOSIS — K859 Acute pancreatitis without necrosis or infection, unspecified: Secondary | ICD-10-CM

## 2020-12-05 LAB — CBC WITH DIFFERENTIAL/PLATELET
Basophils Absolute: 0.2 10*3/uL — ABNORMAL HIGH (ref 0.0–0.1)
Basophils Relative: 1 % (ref 0.0–3.0)
Eosinophils Absolute: 0.1 10*3/uL (ref 0.0–0.7)
Eosinophils Relative: 0.5 % (ref 0.0–5.0)
HCT: 32.9 % — ABNORMAL LOW (ref 39.0–52.0)
Hemoglobin: 10.9 g/dL — ABNORMAL LOW (ref 13.0–17.0)
Lymphocytes Relative: 7.9 % — ABNORMAL LOW (ref 12.0–46.0)
Lymphs Abs: 1.3 10*3/uL (ref 0.7–4.0)
MCHC: 33.1 g/dL (ref 30.0–36.0)
MCV: 85.3 fl (ref 78.0–100.0)
Monocytes Absolute: 0.9 10*3/uL (ref 0.1–1.0)
Monocytes Relative: 5.6 % (ref 3.0–12.0)
Neutro Abs: 14 10*3/uL — ABNORMAL HIGH (ref 1.4–7.7)
Neutrophils Relative %: 85 % — ABNORMAL HIGH (ref 43.0–77.0)
Platelets: 464 10*3/uL — ABNORMAL HIGH (ref 150.0–400.0)
RBC: 3.86 Mil/uL — ABNORMAL LOW (ref 4.22–5.81)
RDW: 15.4 % (ref 11.5–15.5)
WBC: 16.5 10*3/uL — ABNORMAL HIGH (ref 4.0–10.5)

## 2020-12-05 NOTE — Telephone Encounter (Signed)
noted 

## 2020-12-05 NOTE — Progress Notes (Signed)
Attending Physician's Attestation   I have reviewed the chart.   I agree with the Advanced Practitioner's note, impression, and recommendations with any updates as below. Very complex individual.  I have personally reviewed the imaging findings as well.  The cyst gastrostomy stent and collection that was previously present has greatly improved and will need AXIOS stent removal.  The peripancreatic fluid collection that extends from the transverse mesocolon seems to be stabilizing and there may be another cyst that has developed.  Since that is closer to the lateral wall of the stomach does not look to be mature as of yet but could require drainage or aspiration at some point.  The high JP drain output certainly concerns me and could be a reason for his persistent issues at this time.  We will plan to proceed with earlier follow-up by pursuing EGD/EUS with ERCP with potential AXIOS stent pull and with attempt at fully covered self-expanding metal stent into the biliary tract.  As I look at the CT scan, although it is not overtly described, I have some concern as to whether there could be bile leaking from the duct into the cyst cavity space as there looks to be adjacency to the bile duct stents.  Time will tell an ERCP will tell more if we were to see any evidence of an overt leak occurring from the bile duct into the cyst at which point this may require surgical management.  If patient progresses or worsens or his laboratories suggest some significant worsening we may asked the patient to come into the hospital for further therapy/treatments.  I am not sure that that current fluid collection will need to be drained via IR but it may.  Thank you PA Esterwood for seeing the patient today.   Justice Britain, MD [facility] Advanced Endoscopy Office # [phone]

## 2020-12-05 NOTE — Addendum Note (Signed)
Addended by: Justice Britain on: 12/05/2020 05:22 PM   Modules accepted: Level of Service

## 2020-12-05 NOTE — H&P (View-Only) (Signed)
Subjective:    Patient ID: Steve Romero, male    DOB: 07/09/1970, 50 y.o.   MRN: [id number]  HPI Steve Romero is a 50 year old white male, recently established with Dr. Meridee Score during recent hospitalization.  He had undergone cholecystectomy at [facility] on 10/09/2020.  Course complicated by bile leak and underwent initial ERCP and stent placement per Dr. Karilyn Cota that same admission.  He required readmission to [facility] on 10/31/2020 and was found on CT imaging to have severe complicated pancreatitis concerning for necrosis.  He was transferred to [facility].  He also had evidence for persistent bile leak. He underwent ERCP with stent change per Dr. Kristine Garbe 11/09/2020.  The prior stent had migrated out of the bile duct.  He was then scheduled for necrosectomy which was done the following day, and repeated again on 11/11/2020.  He had EUS on 11/14/2020 with 2 double-pigtail stents removed, necrosectomy was done and he had replacement of 1 double-pigtail stent. He was seen by infectious disease, and plans were for IV antibiotics at home with meropenem via PICC line for 1 month until he underwent reassessment with imaging and repeat ERCP. He says today that he has a moderate amount of pain but has been trying not to take his pain medication on a regular basis.  His mother says usually takes 1 or 2 doses per day.  He has been eating small amounts and says he is uncomfortable with trying to eat any larger amounts of food.  He is trying to drink 2-3 Ensure per day between meals, he is not having any vomiting.  He has been running low-grade temps anywhere from 99.9-1 01 over the past week or so.  His mother has been trying to dose him with Motrin in the evening prior to giving him his antibiotics and that has been keeping the temp in the 99 range.  No rigors.  No diarrhea. He continues to have a significant amount of drainage from the JP which they are emptying about 3 times per day.  He is also had leakage from  the JP since prior to discharge from the hospital and they are having to change the bandage a couple of times per day. He has home health care coming out to his home and they redressed the PICC line yesterday for the first time. He has upcoming appointment with infectious disease/Dr. Ilsa Iha on 12/11/2020. He has not had follow-up with his surgeon/Dr. Henreitta Leber. Family says he does not have much energy at all and has been sleeping a lot.   Labs were done yesterday through the home health agency.  We tried to obtain copies of those today but were unable.  CT of the abdomen and pelvis was done yesterday which showed the JP to be in place, common bile duct stents in situ, there are small amount of fluid within the gallbladder fossa, there is diffuse pancreatic edema and inflammation with poor enhancement of the pancreatic parenchyma concerning for necrosis rather diffusely.  There is a collection of gas and debris in the head of the pancreas similar to previous imaging which is actually decreased in size in the interval now measuring 3.0 x 3.2 cm as compared to 4.9 x 3.4 cm.  Cyst gastrostomy stent in place.  There are small rim-enhancing collections in the left retroperitoneal space along the tail of the pancreas and a 7.8 x 4.0 cm collection lateral to the stomach.   Review of Systems Pertinent positive and negative review of systems were noted  in the above HPI section.  All other review of systems was otherwise negative.  Outpatient Encounter Medications as of 12/05/2020  Medication Sig  . acetaminophen (TYLENOL) 325 MG tablet Take 650 mg by mouth every 6 (six) hours as needed.  . cyclobenzaprine (FLEXERIL) 10 MG tablet Take 0.5 tablets (5 mg total) by mouth 3 (three) times daily as needed for muscle spasms.  Marland Kitchen docusate sodium (COLACE) 100 MG capsule Take 1 capsule (100 mg total) by mouth 2 (two) times daily as needed for mild constipation (while taking narcotics).  . docusate sodium (COLACE) 100 MG  capsule Take 1 capsule (100 mg total) by mouth 2 (two) times daily.  . feeding supplement (ENSURE ENLIVE / ENSURE PLUS) LIQD Take 237 mLs by mouth 3 (three) times daily between meals.  . fluconazole (DIFLUCAN) 200 MG tablet Take 2 tablets (400 mg total) by mouth daily for 21 days.  . meropenem (MERREM) IVPB Inject 1 g into the vein every 8 (eight) hours for 21 days. Indication:  necrotizing pancreatitis with infected pseduocyst and fluid collection First Dose: No Last Day of Therapy:  12/13/20 Labs - Once weekly:  CBC/D and BMP, Labs - Every other week:  ESR and CRP Method of administration: Mini-Bag Plus / Gravity Method of administration may be changed at the discretion of home infusion pharmacist based upon assessment of the patient and/or caregiver's ability to self-administer the medication ordered.  . Nystatin (GERHARDT'S BUTT CREAM) CREA Apply 1 application topically daily.  . ondansetron (ZOFRAN ODT) 4 MG disintegrating tablet Take 1 tablet (4 mg total) by mouth every 8 (eight) hours as needed for nausea or vomiting.  Marland Kitchen oxyCODONE (OXY IR/ROXICODONE) 5 MG immediate release tablet Take 1-2 tablets (5-10 mg total) by mouth every 4 (four) hours as needed for severe pain or breakthrough pain.  . polyethylene glycol (MIRALAX / GLYCOLAX) 17 g packet Take 17 g by mouth daily.  . risperiDONE (RISPERDAL) 1 MG tablet Take 1 tablet (1 mg total) by mouth at bedtime. Further fills to psychiatry  . traZODone (DESYREL) 150 MG tablet Take 1-2 tablets (150-300 mg total) by mouth at bedtime. Further fills per psychiatry   No facility-administered encounter medications on file as of 12/05/2020.   Allergies  Allergen Reactions  . Desipramine Nausea Only and Rash   Patient Active Problem List   Diagnosis Date Noted  . Bile leak   . Protein-calorie malnutrition, severe 11/07/2020  . Ascending cholangitis 11/05/2020  . 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 Pancreatic necrosis   . Elevated LFTs   .  Post-ERCP acute pancreatitis 10/11/2020  . Empyema of gallbladder   . Calculus of gallbladder without cholecystitis without obstruction 09/26/2020  . Abdominal wall mass of right upper quadrant 09/26/2020  . 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 Right upper quadrant abdominal pain 09/24/2020  . Mass of right upper extremity 02/14/2020  . Unilateral recurrent inguinal hernia without obstruction or gangrene   . Carpal tunnel syndrome, left 09/24/2016  . Obesity 09/01/2015  . HTN (hypertension) 04/22/2015  . Hyperlipidemia 04/22/2015  . Schizoaffective disorder (HCC) 04/22/2015  . Anxiety state 04/22/2015  . Cigarette nicotine dependence without complication 04/22/2015  . Edema 04/22/2015  . Lymphadenopathy 04/22/2015   Social History   Socioeconomic History  . Marital status: Single    Spouse name: Not on file  . Number of children: Not on file  . Years of education: Not on file  . Highest education level: Not on file  Occupational  History  . Not on file  Tobacco Use  . Smoking status: Current Every Day Smoker    Packs/day: 0.25    Years: 26.00    Pack years: 6.50    Types: Cigarettes  . Smokeless tobacco: Never Used  . Tobacco comment: 4-5 cig daily as of 10/07/2020.  Vaping Use  . Vaping Use: Never used  Substance and Sexual Activity  . Alcohol use: No  . Drug use: No  . Sexual activity: Never    Birth control/protection: None  Other Topics Concern  . Not on file  Social History Narrative  . Not on file   Social Determinants of Health   Financial Resource Strain: Not on file  Food Insecurity: Not on file  Transportation Needs: Not on file  Physical Activity: Not on file  Stress: Not on file  Social Connections: Not on file  Intimate Partner Violence: Not on file    Steve Romero's family history includes Diabetes in his father; Heart disease in his father and maternal grandmother; Stroke in his maternal grandfather.      Objective:    Vitals:    12/05/20 1336  BP: 116/72  Pulse: 62    Physical Exam Well-developed well-nourished white male in no acute distress.  Intermittently somnolent, accompanied by 2 family membersWeight, 234 BMI 33.2  HEENT; nontraumatic normocephalic, EOMI, PE R LA, sclera anicteric. Oropharynx; not examined today Neck; supple, no JVD Cardiovascular; regular rate and rhythm with S1-S2, no murmur rub or gallop Pulmonary; Clear bilaterally Abdomen; soft, obese, he is tender all across the upper abdomen, no rebound, significant fullness, no palpable mass or hepatosplenomegaly, bowel sounds are active JP drain in the right mid abdomen with light yellow bilious effluent Rectal; not done today Skin; benign exam, no jaundice rash or appreciable lesions Extremities; 2+ somewhat brawny edema bilateral lower extremities to the shin Neuro/Psych; alert and oriented x4, grossly nonfocal mood and affect appropriate       Assessment & Plan:   #88 50 year old male with 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  Patient has undergone necrosectomy x3 via cyst gastrostomy over the past month, and CT imaging as of yesterday shows the fluid collection in the head of the pancreas to be a bit smaller.  He still has diffuse pancreatic edema and inflammation rather diffusely concerning for pancreatic necrosis.  Also with 2 other rim-enhancing collections.  Patient continues to have significant drainage from the JP indicative of persistent bile leak despite having to common bile duct stents in place.  #2 intermittent fevers-on meropenem #3 debilitation secondary to above  Plan; CBC with differential, c-Met, CRP today Continue meropenem.  Patient has follow-up with infectious disease next week.  Will discuss intermittent fevers with Dr. Meridee Score. Patient was initially to be scheduled for EGD with removal of the cyst gastrostomy on  12/26/2020. Due to persistent bile leak he will require repeat ERCP with additional stent placement and or replacement of the plastic stents with a metal stent, in addition to EGD and removal of the cyst gastrostomy pigtail stents.  This procedure has been scheduled with Dr. Meridee Score on 12/20/2020 at [facility] at 1:30 PM.  JP to be left in place until bile leak is contained/resolved.     Rilee Wendling S Bryon Parker PA-C 12/05/2020   Cc: Raliegh Ip, DO

## 2020-12-05 NOTE — Patient Instructions (Addendum)
If you are age 50 or younger, your body mass index should be between 19-25. Your Body mass index is 33.21 kg/m. If this is out of the aformentioned range listed, please consider follow up with your Primary Care Provider.  __________________________________________________________  The [facility] providers would like to encourage you to use [facility] to communicate with providers for non-urgent requests or questions.  Due to long hold times on the telephone, sending your provider a message by [facility] may be a faster and more efficient way to get a response.  Please allow 48 business hours for a response.  Please remember that this is for non-urgent requests.   You have been scheduled for an endoscopy. Please follow written instructions given to you at your visit today. If you use inhalers (even only as needed), please bring them with you on the day of your procedure.  Continue all your Current medications.  Keep your already scheduled follow up with Dr. Rush Landmark on 12/24/2020  Thank you for entrusting me with your care and choosing [facility].  Amy Esterwood, PA-C

## 2020-12-05 NOTE — Progress Notes (Signed)
Subjective:    Patient ID: Steve Romero, male    DOB: 07/09/1970, 50 y.o.   MRN: [id number]  HPI Steve Romero is a 50 year old white male, recently established with Dr. Meridee Score during recent hospitalization.  He had undergone cholecystectomy at [facility] on 10/09/2020.  Course complicated by bile leak and underwent initial ERCP and stent placement per Dr. Karilyn Cota that same admission.  He required readmission to [facility] on 10/31/2020 and was found on CT imaging to have severe complicated pancreatitis concerning for necrosis.  He was transferred to [facility].  He also had evidence for persistent bile leak. He underwent ERCP with stent change per Dr. Kristine Garbe 11/09/2020.  The prior stent had migrated out of the bile duct.  He was then scheduled for necrosectomy which was done the following day, and repeated again on 11/11/2020.  He had EUS on 11/14/2020 with 2 double-pigtail stents removed, necrosectomy was done and he had replacement of 1 double-pigtail stent. He was seen by infectious disease, and plans were for IV antibiotics at home with meropenem via PICC line for 1 month until he underwent reassessment with imaging and repeat ERCP. He says today that he has a moderate amount of pain but has been trying not to take his pain medication on a regular basis.  His mother says usually takes 1 or 2 doses per day.  He has been eating small amounts and says he is uncomfortable with trying to eat any larger amounts of food.  He is trying to drink 2-3 Ensure per day between meals, he is not having any vomiting.  He has been running low-grade temps anywhere from 99.9-1 01 over the past week or so.  His mother has been trying to dose him with Motrin in the evening prior to giving him his antibiotics and that has been keeping the temp in the 99 range.  No rigors.  No diarrhea. He continues to have a significant amount of drainage from the JP which they are emptying about 3 times per day.  He is also had leakage from  the JP since prior to discharge from the hospital and they are having to change the bandage a couple of times per day. He has home health care coming out to his home and they redressed the PICC line yesterday for the first time. He has upcoming appointment with infectious disease/Dr. Ilsa Iha on 12/11/2020. He has not had follow-up with his surgeon/Dr. Henreitta Leber. Family says he does not have much energy at all and has been sleeping a lot.   Labs were done yesterday through the home health agency.  We tried to obtain copies of those today but were unable.  CT of the abdomen and pelvis was done yesterday which showed the JP to be in place, common bile duct stents in situ, there are small amount of fluid within the gallbladder fossa, there is diffuse pancreatic edema and inflammation with poor enhancement of the pancreatic parenchyma concerning for necrosis rather diffusely.  There is a collection of gas and debris in the head of the pancreas similar to previous imaging which is actually decreased in size in the interval now measuring 3.0 x 3.2 cm as compared to 4.9 x 3.4 cm.  Cyst gastrostomy stent in place.  There are small rim-enhancing collections in the left retroperitoneal space along the tail of the pancreas and a 7.8 x 4.0 cm collection lateral to the stomach.   Review of Systems Pertinent positive and negative review of systems were noted  in the above HPI section.  All other review of systems was otherwise negative.  Outpatient Encounter Medications as of 12/05/2020  Medication Sig  . acetaminophen (TYLENOL) 325 MG tablet Take 650 mg by mouth every 6 (six) hours as needed.  . cyclobenzaprine (FLEXERIL) 10 MG tablet Take 0.5 tablets (5 mg total) by mouth 3 (three) times daily as needed for muscle spasms.  Marland Kitchen docusate sodium (COLACE) 100 MG capsule Take 1 capsule (100 mg total) by mouth 2 (two) times daily as needed for mild constipation (while taking narcotics).  . docusate sodium (COLACE) 100 MG  capsule Take 1 capsule (100 mg total) by mouth 2 (two) times daily.  . feeding supplement (ENSURE ENLIVE / ENSURE PLUS) LIQD Take 237 mLs by mouth 3 (three) times daily between meals.  . fluconazole (DIFLUCAN) 200 MG tablet Take 2 tablets (400 mg total) by mouth daily for 21 days.  . meropenem (MERREM) IVPB Inject 1 g into the vein every 8 (eight) hours for 21 days. Indication:  necrotizing pancreatitis with infected pseduocyst and fluid collection First Dose: No Last Day of Therapy:  12/13/20 Labs - Once weekly:  CBC/D and BMP, Labs - Every other week:  ESR and CRP Method of administration: Mini-Bag Plus / Gravity Method of administration may be changed at the discretion of home infusion pharmacist based upon assessment of the patient and/or caregiver's ability to self-administer the medication ordered.  . Nystatin (GERHARDT'S BUTT CREAM) CREA Apply 1 application topically daily.  . ondansetron (ZOFRAN ODT) 4 MG disintegrating tablet Take 1 tablet (4 mg total) by mouth every 8 (eight) hours as needed for nausea or vomiting.  Marland Kitchen oxyCODONE (OXY IR/ROXICODONE) 5 MG immediate release tablet Take 1-2 tablets (5-10 mg total) by mouth every 4 (four) hours as needed for severe pain or breakthrough pain.  . polyethylene glycol (MIRALAX / GLYCOLAX) 17 g packet Take 17 g by mouth daily.  . risperiDONE (RISPERDAL) 1 MG tablet Take 1 tablet (1 mg total) by mouth at bedtime. Further fills to psychiatry  . traZODone (DESYREL) 150 MG tablet Take 1-2 tablets (150-300 mg total) by mouth at bedtime. Further fills per psychiatry   No facility-administered encounter medications on file as of 12/05/2020.   Allergies  Allergen Reactions  . Desipramine Nausea Only and Rash   Patient Active Problem List   Diagnosis Date Noted  . Bile leak   . Protein-calorie malnutrition, severe 11/07/2020  . Ascending cholangitis 11/05/2020  . 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 Pancreatic necrosis   . Elevated LFTs   .  Post-ERCP acute pancreatitis 10/11/2020  . Empyema of gallbladder   . Calculus of gallbladder without cholecystitis without obstruction 09/26/2020  . Abdominal wall mass of right upper quadrant 09/26/2020  . 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 Right upper quadrant abdominal pain 09/24/2020  . Mass of right upper extremity 02/14/2020  . Unilateral recurrent inguinal hernia without obstruction or gangrene   . Carpal tunnel syndrome, left 09/24/2016  . Obesity 09/01/2015  . HTN (hypertension) 04/22/2015  . Hyperlipidemia 04/22/2015  . Schizoaffective disorder (HCC) 04/22/2015  . Anxiety state 04/22/2015  . Cigarette nicotine dependence without complication 04/22/2015  . Edema 04/22/2015  . Lymphadenopathy 04/22/2015   Social History   Socioeconomic History  . Marital status: Single    Spouse name: Not on file  . Number of children: Not on file  . Years of education: Not on file  . Highest education level: Not on file  Occupational  History  . Not on file  Tobacco Use  . Smoking status: Current Every Day Smoker    Packs/day: 0.25    Years: 26.00    Pack years: 6.50    Types: Cigarettes  . Smokeless tobacco: Never Used  . Tobacco comment: 4-5 cig daily as of 10/07/2020.  Vaping Use  . Vaping Use: Never used  Substance and Sexual Activity  . Alcohol use: No  . Drug use: No  . Sexual activity: Never    Birth control/protection: None  Other Topics Concern  . Not on file  Social History Narrative  . Not on file   Social Determinants of Health   Financial Resource Strain: Not on file  Food Insecurity: Not on file  Transportation Needs: Not on file  Physical Activity: Not on file  Stress: Not on file  Social Connections: Not on file  Intimate Partner Violence: Not on file    Mr. Krieger's family history includes Diabetes in his father; Heart disease in his father and maternal grandmother; Stroke in his maternal grandfather.      Objective:    Vitals:    12/05/20 1336  BP: 116/72  Pulse: 62    Physical Exam Well-developed well-nourished white male in no acute distress.  Intermittently somnolent, accompanied by 2 family membersWeight, 234 BMI 33.2  HEENT; nontraumatic normocephalic, EOMI, PE R LA, sclera anicteric. Oropharynx; not examined today Neck; supple, no JVD Cardiovascular; regular rate and rhythm with S1-S2, no murmur rub or gallop Pulmonary; Clear bilaterally Abdomen; soft, obese, he is tender all across the upper abdomen, no rebound, significant fullness, no palpable mass or hepatosplenomegaly, bowel sounds are active JP drain in the right mid abdomen with light yellow bilious effluent Rectal; not done today Skin; benign exam, no jaundice rash or appreciable lesions Extremities; 2+ somewhat brawny edema bilateral lower extremities to the shin Neuro/Psych; alert and oriented x4, grossly nonfocal mood and affect appropriate       Assessment & Plan:   #88 50 year old male with 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  Patient has undergone necrosectomy x3 via cyst gastrostomy over the past month, and CT imaging as of yesterday shows the fluid collection in the head of the pancreas to be a bit smaller.  He still has diffuse pancreatic edema and inflammation rather diffusely concerning for pancreatic necrosis.  Also with 2 other rim-enhancing collections.  Patient continues to have significant drainage from the JP indicative of persistent bile leak despite having to common bile duct stents in place.  #2 intermittent fevers-on meropenem #3 debilitation secondary to above  Plan; CBC with differential, c-Met, CRP today Continue meropenem.  Patient has follow-up with infectious disease next week.  Will discuss intermittent fevers with Dr. Meridee Score. Patient was initially to be scheduled for EGD with removal of the cyst gastrostomy on  12/26/2020. Due to persistent bile leak he will require repeat ERCP with additional stent placement and or replacement of the plastic stents with a metal stent, in addition to EGD and removal of the cyst gastrostomy pigtail stents.  This procedure has been scheduled with Dr. Meridee Score on 12/20/2020 at [facility] at 1:30 PM.  JP to be left in place until bile leak is contained/resolved.     Rilee Wendling S Bryon Parker PA-C 12/05/2020   Cc: Raliegh Ip, DO

## 2020-12-06 LAB — COMPREHENSIVE METABOLIC PANEL
ALT: 27 U/L (ref 0–53)
AST: 26 U/L (ref 0–37)
Albumin: 2.6 g/dL — ABNORMAL LOW (ref 3.5–5.2)
Alkaline Phosphatase: 170 U/L — ABNORMAL HIGH (ref 39–117)
BUN: 11 mg/dL (ref 6–23)
CO2: 29 mEq/L (ref 19–32)
Calcium: 8.4 mg/dL (ref 8.4–10.5)
Chloride: 95 mEq/L — ABNORMAL LOW (ref 96–112)
Creatinine, Ser: 0.6 mg/dL (ref 0.40–1.50)
GFR: 113.07 mL/min (ref >60.00)
Glucose, Bld: 102 mg/dL — ABNORMAL HIGH (ref 70–99)
Potassium: 4.2 mEq/L (ref 3.5–5.1)
Sodium: 135 mEq/L (ref 135–145)
Total Bilirubin: 0.5 mg/dL (ref 0.2–1.2)
Total Protein: 7 g/dL (ref 6.0–8.3)

## 2020-12-06 LAB — C-REACTIVE PROTEIN: CRP: 21 mg/dL — ABNORMAL HIGH (ref 0.5–20.0)

## 2020-12-07 ENCOUNTER — Telehealth (INDEPENDENT_AMBULATORY_CARE_PROVIDER_SITE_OTHER): Payer: Medicaid Other | Admitting: General Surgery

## 2020-12-07 DIAGNOSIS — K8689 Other specified diseases of pancreas: Secondary | ICD-10-CM

## 2020-12-07 NOTE — Telephone Encounter (Signed)
[facility]  Sister Jacqlyn Larsen called office wanting to discuss what all is going on and CT findings and labs.  Still with fever at night and diarrhea but overall a little better at eating.  He is trying to take the pain pills now after everyone has told him that he needs to take some.  She thinks he is looking somewhat better. C dif checked due to diarrhea. No results back on that. Lab work all pretty stable WBC and CRP stable. CT with interval collections developing around stomach and pancreas.     JP drain is being emptied multiple times a day. Jacqlyn Larsen says it is light yellow. Dr. Rush Landmark thinks maybe bile duct leaking.  Also could just be reactive fluid/ascites based on the color. Plans for ERCP 6/17.  Jacobo sees Dr. Baxter Flattery 6/8 and will decide about antibiotics.   Will continue to update family as they desire as I am happy to help with communication. JP to remain in place given drainage amount and potential for leak.   Jacqlyn Larsen knows to call whenever they need.  Curlene Labrum, MD [facility] [address] [place], [place] [address] [phone] (office)

## 2020-12-11 ENCOUNTER — Ambulatory Visit (INDEPENDENT_AMBULATORY_CARE_PROVIDER_SITE_OTHER): Payer: Medicaid Other | Admitting: Internal Medicine

## 2020-12-11 ENCOUNTER — Telehealth: Payer: Self-pay

## 2020-12-11 ENCOUNTER — Other Ambulatory Visit: Payer: Self-pay

## 2020-12-11 ENCOUNTER — Encounter: Payer: Self-pay | Admitting: Internal Medicine

## 2020-12-11 VITALS — BP 103/70 | HR 80 | Wt 234.0 lb

## 2020-12-11 DIAGNOSIS — E43 Unspecified severe protein-calorie malnutrition: Secondary | ICD-10-CM

## 2020-12-11 DIAGNOSIS — K8689 Other specified diseases of pancreas: Secondary | ICD-10-CM | POA: Diagnosis not present

## 2020-12-11 MED ORDER — AMOXICILLIN-POT CLAVULANATE 875-125 MG PO TABS: 1.0000 | ORAL_TABLET | Freq: Two times a day (BID) | ORAL | 0 refills | Status: DC

## 2020-12-11 NOTE — Telephone Encounter (Signed)
Verbal orders given to [facility] to pull PICC after completion of IV abx on 6/11. Patient aware.   Tawnie Ehresman Lorita Officer, RN

## 2020-12-11 NOTE — Progress Notes (Signed)
RFV: follow up for hospitalization for pancreatitis/  Patient ID: Steve Romero, male   DOB: [birth year], 50 y.o.   MRN: [id number]  HPI 50yo M with initial cholecystectomy c/b bile leak, pancreatitis with pseudocyst requiring cystogastrostomy, has been on meropenem since discharge. Has upcoming  Repeat ERCP to see if needs further stenting. He is currently having no issues with doing meropenem. Occasionally feels like he has chills, fever x 1 this past week.  Outpatient Encounter Medications as of 12/11/2020  Medication Sig   meropenem (MERREM) IVPB Inject 1 g into the vein every 8 (eight) hours for 21 days. Indication:  necrotizing pancreatitis with infected pseduocyst and fluid collection First Dose: No Last Day of Therapy:  12/13/20 Labs - Once weekly:  CBC/D and BMP, Labs - Every other week:  ESR and CRP Method of administration: Mini-Bag Plus / Gravity Method of administration may be changed at the discretion of home infusion pharmacist based upon assessment of the patient and/or caregiver's ability to self-administer the medication ordered.   acetaminophen (TYLENOL) 325 MG tablet Take 650 mg by mouth every 6 (six) hours as needed.   cyclobenzaprine (FLEXERIL) 10 MG tablet Take 0.5 tablets (5 mg total) by mouth 3 (three) times daily as needed for muscle spasms.   docusate sodium (COLACE) 100 MG capsule Take 1 capsule (100 mg total) by mouth 2 (two) times daily as needed for mild constipation (while taking narcotics).   docusate sodium (COLACE) 100 MG capsule Take 1 capsule (100 mg total) by mouth 2 (two) times daily.   feeding supplement (ENSURE ENLIVE / ENSURE PLUS) LIQD Take 237 mLs by mouth 3 (three) times daily between meals.   fluconazole (DIFLUCAN) 200 MG tablet Take 2 tablets (400 mg total) by mouth daily for 21 days.   Nystatin (GERHARDT'S BUTT CREAM) CREA Apply 1 application topically daily.   ondansetron (ZOFRAN ODT) 4 MG disintegrating tablet Take 1 tablet (4 mg total) by  mouth every 8 (eight) hours as needed for nausea or vomiting.   oxyCODONE (OXY IR/ROXICODONE) 5 MG immediate release tablet Take 1-2 tablets (5-10 mg total) by mouth every 4 (four) hours as needed for severe pain or breakthrough pain.   polyethylene glycol (MIRALAX / GLYCOLAX) 17 g packet Take 17 g by mouth daily.   risperiDONE (RISPERDAL) 1 MG tablet Take 1 tablet (1 mg total) by mouth at bedtime. Further fills to psychiatry   traZODone (DESYREL) 150 MG tablet Take 1-2 tablets (150-300 mg total) by mouth at bedtime. Further fills per psychiatry   No facility-administered encounter medications on file as of 12/11/2020.     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   Pancreatic necrosis    Elevated LFTs    Post-ERCP acute pancreatitis 10/11/2020   Empyema of gallbladder    Calculus of gallbladder without cholecystitis without obstruction 09/26/2020   Abdominal wall mass of right upper quadrant 09/26/2020   Neoplasm of uncertain behavior 76/19/5093   Right upper quadrant abdominal pain 09/24/2020   Mass of right upper extremity 02/14/2020   Unilateral recurrent inguinal hernia without obstruction or gangrene    Carpal tunnel syndrome, left 09/24/2016   Obesity 09/01/2015   HTN (hypertension) 04/22/2015   Hyperlipidemia 04/22/2015   Schizoaffective disorder ([place]) 04/22/2015   Anxiety state 04/22/2015   Cigarette nicotine dependence without complication 26/71/2458   Edema 04/22/2015  Lymphadenopathy 04/22/2015     Health Maintenance Due  Topic Date Due   Pneumococcal Vaccine 25-42 Years old (1 of 4 - PCV13) Never done   COVID-19 Vaccine (1) Never done   COLONOSCOPY (Pts 45-58yr Insurance coverage will need to be confirmed)  Never done     Review of Systems Starting to have improved appetite, occ abdominal discomfort. 12 point ros  is otherwise negative Physical Exam   BP 103/70   Pulse 80   Wt 234 lb (106.1 kg)   BMI 33.10 kg/m   Physical Exam  Constitutional:  oriented to person, place, and time. appears well-developed and well-nourished. No distress.  HENT: [place]/AT, PERRLA, no scleral icterus Mouth/Throat: Oropharynx is clear and moist. No oropharyngeal exudate.  Cardiovascular: Normal rate, regular rhythm and normal heart sounds. Exam reveals no gallop and no friction rub.  No murmur heard.  Pulmonary/Chest: Effort normal and breath sounds normal. No respiratory distress.  has no wheezes.  Abdominal: Soft. Bowel sounds are normal.  exhibits no distension. There is no tenderness.  Ext: picc line is c/d/i Neurological: alert and oriented to person, place, and time.  Skin: Skin is warm and dry. No rash noted. No erythema.  Psychiatric: a normal mood and affect.  behavior is normal.    CBC Lab Results  Component Value Date   WBC 16.5 (H) 12/05/2020   RBC 3.86 (L) 12/05/2020   HGB 10.9 (L) 12/05/2020   HCT 32.9 (L) 12/05/2020   PLT 464.0 (H) 12/05/2020   MCV 85.3 12/05/2020   MCH 30.5 11/24/2020   MCHC 33.1 12/05/2020   RDW 15.4 12/05/2020   LYMPHSABS 1.3 12/05/2020   MONOABS 0.9 12/05/2020   EOSABS 0.1 12/05/2020    BMET Lab Results  Component Value Date   NA 135 12/05/2020   K 4.2 12/05/2020   CL 95 (L) 12/05/2020   CO2 29 12/05/2020   GLUCOSE 102 (H) 12/05/2020   BUN 11 12/05/2020   CREATININE 0.60 12/05/2020   CALCIUM 8.4 12/05/2020   GFRNONAA >60 11/24/2020   GFRAA 85 10/04/2019      Assessment and Plan  Loss of appetite = related to pancreatitis. Continue with encouraging good oral intake, possibly protein shake.  Necrotic pancreatitis =  he is slated for abtx, meropenem, through Friday. Pull picc line, bridge with amox/clav for 14 addn days. thorugh ercp. Defer to gi if need to extend.

## 2020-12-11 NOTE — Telephone Encounter (Signed)
Noted  

## 2020-12-13 ENCOUNTER — Telehealth (INDEPENDENT_AMBULATORY_CARE_PROVIDER_SITE_OTHER): Payer: Medicaid Other | Admitting: General Surgery

## 2020-12-13 DIAGNOSIS — K8689 Other specified diseases of pancreas: Secondary | ICD-10-CM

## 2020-12-13 MED ORDER — OXYCODONE HCL 5 MG PO TABS: 5.0000 mg | ORAL_TABLET | ORAL | 0 refills | Status: DC | PRN

## 2020-12-13 NOTE — Telephone Encounter (Signed)
[facility]  Patient called office requesting refill. JP drain in place. Dr. Rush Landmark doing his procedure next week.   Roxicodone Refill sent in.   Curlene Labrum, MD [facility] [address] [place], [place] [address] [phone] (office)

## 2020-12-17 ENCOUNTER — Other Ambulatory Visit: Payer: Self-pay

## 2020-12-19 NOTE — Anesthesia Preprocedure Evaluation (Addendum)
Anesthesia Evaluation  Patient identified by MRN, date of birth, ID band Patient awake    Reviewed: Allergy & Precautions, NPO status , Patient's Chart, lab work & pertinent test results  Airway Mallampati: I  TM Distance: >3 FB Neck ROM: Full    Dental no notable dental hx. (+) Edentulous Upper, Edentulous Lower, Dental Advisory Given   Pulmonary sleep apnea , COPD, Current Smoker,    Pulmonary exam normal breath sounds clear to auscultation       Cardiovascular hypertension, Pt. on medications Normal cardiovascular exam Rhythm:Regular Rate:Normal  10/11/2020 Echo  1. Poor acoustic windows limit study.  2. Cannot fully evaluate regional wall motion as endocardium is difficult  to see.. Left ventricular ejection fraction, by estimation, is >75%. The  left ventricle has hyperdynamic function. There is mild left ventricular  hypertrophy. Left ventricular  diastolic parameters were normal.  3. Right ventricular systolic function is normal. The right ventricular  size is normal.  4. The mitral valve is normal in structure. Trivial mitral valve  regurgitation.  5. The aortic valve is abnormal. Aortic valve regurgitation is not  visualized. Mild aortic valve sclerosis is present, with no evidence of  aortic valve stenosis.  6. The inferior vena cava is normal in size with greater than 50%  respiratory variability, suggesting right atrial pressure of 3 mmHg.    Neuro/Psych Anxiety Depression    GI/Hepatic Neg liver ROS,   Endo/Other  negative endocrine ROS  Renal/GU Lab Results      Component                Value               Date                      CREATININE               0.60                12/05/2020                BUN                      11                  12/05/2020                NA                       135                 12/05/2020                K                        4.2                 12/05/2020                 CL                       95 (L)              12/05/2020                CO2  29                  12/05/2020                Musculoskeletal   Abdominal (+) + obese,   Peds  Hematology Lab Results      Component                Value               Date                      WBC                      16.5 (H)            12/05/2020                HGB                      10.9 (L)            12/05/2020                HCT                      32.9 (L)            12/05/2020                MCV                      85.3                12/05/2020                PLT                      464.0 (H)           12/05/2020              Anesthesia Other Findings   Reproductive/Obstetrics                            Anesthesia Physical Anesthesia Plan  ASA: 3  Anesthesia Plan: General   Post-op Pain Management:    Induction: Intravenous  PONV Risk Score and Plan: 3 and Treatment may vary due to age or medical condition, Ondansetron and Midazolam  Airway Management Planned: Oral ETT  Additional Equipment: None  Intra-op Plan:   Post-operative Plan: Extubation in OR  Informed Consent: I have reviewed the patients History and Physical, chart, labs and discussed the procedure including the risks, benefits and alternatives for the proposed anesthesia with the patient or authorized representative who has indicated his/her understanding and acceptance.     Dental advisory given  Plan Discussed with: CRNA  Anesthesia Plan Comments: (ERCP for pancreatic necrosis)       Anesthesia Quick Evaluation

## 2020-12-20 ENCOUNTER — Observation Stay (HOSPITAL_COMMUNITY): Payer: Medicaid Other

## 2020-12-20 ENCOUNTER — Encounter (HOSPITAL_COMMUNITY)
Admission: RE | Disposition: A | Discharge status: Home / Self Care | Payer: Self-pay | Source: Home / Self Care | Attending: Internal Medicine

## 2020-12-20 ENCOUNTER — Ambulatory Visit (HOSPITAL_COMMUNITY): Payer: Medicaid Other | Admitting: Anesthesiology

## 2020-12-20 ENCOUNTER — Other Ambulatory Visit: Payer: Self-pay

## 2020-12-20 ENCOUNTER — Observation Stay (HOSPITAL_COMMUNITY)
Admission: RE | Admit: 2020-12-20 | Discharge: 2020-12-21 | Disposition: A | Discharge status: Home / Self Care | Payer: Medicaid Other | Attending: Internal Medicine | Admitting: Internal Medicine

## 2020-12-20 ENCOUNTER — Encounter (HOSPITAL_COMMUNITY): Payer: Self-pay | Admitting: Gastroenterology

## 2020-12-20 ENCOUNTER — Ambulatory Visit (HOSPITAL_COMMUNITY): Payer: Medicaid Other

## 2020-12-20 DIAGNOSIS — K2289 Other specified disease of esophagus: Secondary | ICD-10-CM | POA: Diagnosis not present

## 2020-12-20 DIAGNOSIS — K838 Other specified diseases of biliary tract: Secondary | ICD-10-CM | POA: Insufficient documentation

## 2020-12-20 DIAGNOSIS — K3189 Other diseases of stomach and duodenum: Secondary | ICD-10-CM | POA: Insufficient documentation

## 2020-12-20 DIAGNOSIS — Z4659 Encounter for fitting and adjustment of other gastrointestinal appliance and device: Secondary | ICD-10-CM | POA: Insufficient documentation

## 2020-12-20 DIAGNOSIS — F17210 Nicotine dependence, cigarettes, uncomplicated: Secondary | ICD-10-CM | POA: Insufficient documentation

## 2020-12-20 DIAGNOSIS — K91840 Postprocedural hemorrhage and hematoma of a digestive system organ or structure following a digestive system procedure: Secondary | ICD-10-CM

## 2020-12-20 DIAGNOSIS — K839 Disease of biliary tract, unspecified: Secondary | ICD-10-CM | POA: Diagnosis not present

## 2020-12-20 DIAGNOSIS — R0602 Shortness of breath: Secondary | ICD-10-CM

## 2020-12-20 DIAGNOSIS — E43 Unspecified severe protein-calorie malnutrition: Secondary | ICD-10-CM | POA: Insufficient documentation

## 2020-12-20 DIAGNOSIS — I10 Essential (primary) hypertension: Secondary | ICD-10-CM | POA: Diagnosis not present

## 2020-12-20 DIAGNOSIS — K9189 Other postprocedural complications and disorders of digestive system: Secondary | ICD-10-CM | POA: Insufficient documentation

## 2020-12-20 DIAGNOSIS — K851 Biliary acute pancreatitis without necrosis or infection: Secondary | ICD-10-CM | POA: Diagnosis not present

## 2020-12-20 DIAGNOSIS — K8689 Other specified diseases of pancreas: Secondary | ICD-10-CM

## 2020-12-20 DIAGNOSIS — K859 Acute pancreatitis without necrosis or infection, unspecified: Secondary | ICD-10-CM | POA: Diagnosis not present

## 2020-12-20 DIAGNOSIS — J449 Chronic obstructive pulmonary disease, unspecified: Secondary | ICD-10-CM | POA: Diagnosis not present

## 2020-12-20 DIAGNOSIS — D72829 Elevated white blood cell count, unspecified: Secondary | ICD-10-CM | POA: Insufficient documentation

## 2020-12-20 DIAGNOSIS — J9601 Acute respiratory failure with hypoxia: Secondary | ICD-10-CM | POA: Insufficient documentation

## 2020-12-20 HISTORY — PX: REMOVAL OF STONES: SHX5545

## 2020-12-20 HISTORY — PX: ESOPHAGOGASTRODUODENOSCOPY (EGD) WITH PROPOFOL: SHX5813

## 2020-12-20 HISTORY — PX: ENDOSCOPIC RETROGRADE CHOLANGIOPANCREATOGRAPHY (ERCP) WITH PROPOFOL: SHX5810

## 2020-12-20 HISTORY — PX: BIOPSY: SHX5522

## 2020-12-20 HISTORY — PX: SPYGLASS CHOLANGIOSCOPY: SHX5441

## 2020-12-20 HISTORY — PX: GASTROINTESTINAL STENT REMOVAL: SHX6384

## 2020-12-20 HISTORY — PX: STENT REMOVAL: SHX6421

## 2020-12-20 LAB — CBC WITH DIFFERENTIAL/PLATELET
Band Neutrophils: 8 %
Basophils Relative: 0 %
Blasts: NONE SEEN %
Eosinophils Relative: 0 %
HCT: 34.2 % — ABNORMAL LOW (ref 39.0–52.0)
Hemoglobin: 10.7 g/dL — ABNORMAL LOW (ref 13.0–17.0)
Lymphocytes Relative: 2 %
MCH: 28.2 pg (ref 26.0–34.0)
MCHC: 31.3 g/dL (ref 30.0–36.0)
MCV: 90 fL (ref 80.0–100.0)
Metamyelocytes Relative: NONE SEEN %
Monocytes Relative: 0 %
Myelocytes: NONE SEEN %
Neutrophils Relative %: 90 %
Platelets: 504 10*3/uL — ABNORMAL HIGH (ref 150–400)
Promyelocytes Relative: NONE SEEN %
RBC Morphology: NORMAL
RBC: 3.8 MIL/uL — ABNORMAL LOW (ref 4.22–5.81)
RDW: 15.6 % — ABNORMAL HIGH (ref 11.5–15.5)
WBC Morphology: NORMAL
WBC: 27.1 10*3/uL — ABNORMAL HIGH (ref 4.0–10.5)
nRBC: 0 % (ref 0.0–0.2)
nRBC: NONE SEEN /100 WBC

## 2020-12-20 LAB — COMPREHENSIVE METABOLIC PANEL
ALT: 20 U/L (ref 0–44)
AST: 24 U/L (ref 15–41)
Albumin: 2.1 g/dL — ABNORMAL LOW (ref 3.5–5.0)
Alkaline Phosphatase: 244 U/L — ABNORMAL HIGH (ref 38–126)
Anion gap: 10 (ref 5–15)
BUN: 11 mg/dL (ref 6–20)
CO2: 30 mmol/L (ref 22–32)
Calcium: 8.5 mg/dL — ABNORMAL LOW (ref 8.9–10.3)
Chloride: 95 mmol/L — ABNORMAL LOW (ref 98–111)
Creatinine, Ser: 1.2 mg/dL (ref 0.61–1.24)
GFR, Estimated: >60 mL/min (ref >60)
Glucose, Bld: 141 mg/dL — ABNORMAL HIGH (ref 70–99)
Potassium: 4.1 mmol/L (ref 3.5–5.1)
Sodium: 135 mmol/L (ref 135–145)
Total Bilirubin: 0.9 mg/dL (ref 0.3–1.2)
Total Protein: 6.6 g/dL (ref 6.5–8.1)

## 2020-12-20 LAB — MAGNESIUM: Magnesium: 2.1 mg/dL (ref 1.7–2.4)

## 2020-12-20 LAB — PROTIME-INR
INR: 1.2 (ref 0.8–1.2)
Prothrombin Time: 15.6 seconds — ABNORMAL HIGH (ref 11.4–15.2)

## 2020-12-20 LAB — PHOSPHORUS: Phosphorus: 6.9 mg/dL — ABNORMAL HIGH (ref 2.5–4.6)

## 2020-12-20 LAB — HIV ANTIBODY (ROUTINE TESTING W REFLEX): HIV Screen 4th Generation wRfx: NONREACTIVE

## 2020-12-20 SURGERY — ESOPHAGOGASTRODUODENOSCOPY (EGD) WITH PROPOFOL
Anesthesia: General

## 2020-12-20 MED ORDER — IOHEXOL 300 MG/ML  SOLN
100.0000 mL | Freq: Once | INTRAMUSCULAR | Status: AC | PRN
  Administered 2020-12-20: 100 mL via INTRAVENOUS

## 2020-12-20 MED ORDER — INDOMETHACIN 50 MG RE SUPP
RECTAL | Status: AC
  Filled 2020-12-20: qty 2

## 2020-12-20 MED ORDER — MIDAZOLAM HCL 5 MG/5ML IJ SOLN
INTRAMUSCULAR | Status: DC | PRN
  Administered 2020-12-20: 2 mg via INTRAVENOUS

## 2020-12-20 MED ORDER — ONDANSETRON HCL 4 MG/2ML IJ SOLN: 4.0000 mg | Freq: Four times a day (QID) | INTRAMUSCULAR | Status: DC | PRN

## 2020-12-20 MED ORDER — OXYCODONE HCL 5 MG PO TABS: 5.0000 mg | ORAL_TABLET | ORAL | Status: DC | PRN

## 2020-12-20 MED ORDER — GLUCAGON HCL RDNA (DIAGNOSTIC) 1 MG IJ SOLR
INTRAMUSCULAR | Status: DC | PRN
  Administered 2020-12-20 (×3): .25 mg via INTRAVENOUS

## 2020-12-20 MED ORDER — IOHEXOL 9 MG/ML PO SOLN
500.0000 mL | ORAL | Status: AC
  Administered 2020-12-20: 500 mL via ORAL

## 2020-12-20 MED ORDER — SODIUM CHLORIDE 0.9 % IV SOLN
INTRAVENOUS | Status: DC | PRN
  Administered 2020-12-20: 125 mL

## 2020-12-20 MED ORDER — LIDOCAINE 2% (20 MG/ML) 5 ML SYRINGE
INTRAMUSCULAR | Status: DC | PRN
  Administered 2020-12-20: 80 mg via INTRAVENOUS

## 2020-12-20 MED ORDER — FENTANYL CITRATE (PF) 100 MCG/2ML IJ SOLN
INTRAMUSCULAR | Status: DC | PRN
  Administered 2020-12-20 (×4): 50 ug via INTRAVENOUS

## 2020-12-20 MED ORDER — ACETAMINOPHEN 650 MG RE SUPP: 650.0000 mg | Freq: Four times a day (QID) | RECTAL | Status: DC | PRN

## 2020-12-20 MED ORDER — SODIUM CHLORIDE 0.9 % IV SOLN: INTRAVENOUS | Status: DC

## 2020-12-20 MED ORDER — DEXAMETHASONE SODIUM PHOSPHATE 10 MG/ML IJ SOLN
INTRAMUSCULAR | Status: DC | PRN
  Administered 2020-12-20: 10 mg via INTRAVENOUS

## 2020-12-20 MED ORDER — GLUCAGON HCL RDNA (DIAGNOSTIC) 1 MG IJ SOLR
INTRAMUSCULAR | Status: AC
  Filled 2020-12-20: qty 1

## 2020-12-20 MED ORDER — ROCURONIUM BROMIDE 10 MG/ML (PF) SYRINGE
PREFILLED_SYRINGE | INTRAVENOUS | Status: DC | PRN
  Administered 2020-12-20 (×2): 10 mg via INTRAVENOUS
  Administered 2020-12-20: 60 mg via INTRAVENOUS
  Administered 2020-12-20: 10 mg via INTRAVENOUS

## 2020-12-20 MED ORDER — INDOMETHACIN 50 MG RE SUPP
RECTAL | Status: DC | PRN
  Administered 2020-12-20: 100 mg via RECTAL

## 2020-12-20 MED ORDER — MIDAZOLAM HCL 2 MG/2ML IJ SOLN
INTRAMUSCULAR | Status: AC
  Filled 2020-12-20: qty 2

## 2020-12-20 MED ORDER — PROPOFOL 10 MG/ML IV BOLUS
INTRAVENOUS | Status: DC | PRN
  Administered 2020-12-20: 150 mg via INTRAVENOUS

## 2020-12-20 MED ORDER — ENSURE ENLIVE PO LIQD
237.0000 mL | Freq: Three times a day (TID) | ORAL | Status: DC
  Administered 2020-12-21 (×2): 237 mL via ORAL

## 2020-12-20 MED ORDER — LACTATED RINGERS IV SOLN: Freq: Once | INTRAVENOUS | Status: AC

## 2020-12-20 MED ORDER — AMOXICILLIN-POT CLAVULANATE 875-125 MG PO TABS
1.0000 | ORAL_TABLET | Freq: Two times a day (BID) | ORAL | Status: DC
  Administered 2020-12-20 – 2020-12-21 (×2): 1 via ORAL
  Filled 2020-12-20 (×2): qty 1

## 2020-12-20 MED ORDER — ACETAMINOPHEN 325 MG PO TABS: 650.0000 mg | ORAL_TABLET | Freq: Four times a day (QID) | ORAL | Status: DC | PRN

## 2020-12-20 MED ORDER — FENTANYL CITRATE (PF) 100 MCG/2ML IJ SOLN: 12.5000 ug | INTRAMUSCULAR | Status: DC | PRN

## 2020-12-20 MED ORDER — FENTANYL CITRATE (PF) 100 MCG/2ML IJ SOLN
INTRAMUSCULAR | Status: AC
  Filled 2020-12-20: qty 2

## 2020-12-20 MED ORDER — HYDRALAZINE HCL 20 MG/ML IJ SOLN: 10.0000 mg | Freq: Four times a day (QID) | INTRAMUSCULAR | Status: DC | PRN

## 2020-12-20 MED ORDER — ONDANSETRON HCL 4 MG/2ML IJ SOLN
INTRAMUSCULAR | Status: DC | PRN
  Administered 2020-12-20: 4 mg via INTRAVENOUS

## 2020-12-20 MED ORDER — LACTATED RINGERS IV SOLN: INTRAVENOUS | Status: DC

## 2020-12-20 MED ORDER — SODIUM CHLORIDE (PF) 0.9 % IJ SOLN
INTRAMUSCULAR | Status: AC
  Filled 2020-12-20: qty 50

## 2020-12-20 MED ORDER — ONDANSETRON HCL 4 MG PO TABS: 4.0000 mg | ORAL_TABLET | Freq: Four times a day (QID) | ORAL | Status: DC | PRN

## 2020-12-20 MED ORDER — POLYETHYLENE GLYCOL 3350 17 G PO PACK: 17.0000 g | PACK | Freq: Every day | ORAL | Status: DC | PRN

## 2020-12-20 MED ORDER — LACTATED RINGERS IV SOLN: INTRAVENOUS | Status: DC | PRN

## 2020-12-20 MED ORDER — CIPROFLOXACIN IN D5W 400 MG/200ML IV SOLN
INTRAVENOUS | Status: AC
  Filled 2020-12-20: qty 200

## 2020-12-20 MED ORDER — PROPOFOL 10 MG/ML IV BOLUS
INTRAVENOUS | Status: AC
  Filled 2020-12-20: qty 20

## 2020-12-20 MED ORDER — BISACODYL 10 MG RE SUPP: 10.0000 mg | Freq: Every day | RECTAL | Status: DC | PRN

## 2020-12-20 MED ORDER — CIPROFLOXACIN IN D5W 400 MG/200ML IV SOLN
INTRAVENOUS | Status: DC | PRN
  Administered 2020-12-20: 400 mg via INTRAVENOUS

## 2020-12-20 SURGICAL SUPPLY — 14 items

## 2020-12-20 NOTE — H&P (Signed)
History and Physical    [name] CLE:[id number] DOB: 06/01/1971 DOA: 12/20/2020  PCP: [name], DO   Patient coming from: Endoscopy Suite  Chief Complaint: Post-ERCP Bleeding   HPI: Steve Romero is a 50 y.o. male with medical history significant for but not limited too anxiety and depression, bipolar disorder, COPD, history of enlarged prostate, hyperlipidemia, hypertension, history of schizoaffective disorder, sleep apnea as well as other comorbidities who presented as a direct admission from the endoscopy suite at the request of Dr Justice Britain.  The patient has had a complex history including a cholecystectomy complicated by bile leak post ERCP pancreatitis with pseudocyst requiring cystogastrostomy due to bile leak and was transferred to [facility] about a month ago for pancreatic necrosis as well as a cystogastrostomy stent AXIOS stent placement.  He has been seen by Dr. Rush Landmark several times and underwent stenting for his bile leak.  He be antibiotics with IV meropenem and had his PICC line removed last Friday and started on amoxicillin/Lovenox acid for additional 14 days through his repeat ERCP.  He presented today to [facility] for his AXIOS stent removal via ERCP and he underwent an EGD/EUS.  After his ERCP and stent removal he had some postoperative bleeding which was a little bit more than what the GI physician would like so GI requested observation to monitor his hemoglobin and blood counts.  He denies any complaints currently and denies any nausea or vomiting, lightheadedness or dizziness and has no abdominal pain.  The GI physician is concerned that his bile leak is not related to his stenting and feels that his peripheral and requests general surgery consultation given that Dr. Lequita Asal feels that he has done all that he can be endoscopic route.  Was asked admit this patient for observation to monitor his blood counts and get a surgical opinion upon his  peripheral bile leak.  Currently has a JP drain which is collecting the fluid that the patient empties several times a day.  ED Course: No ED course of the patient was directly admitted from endoscopy suite  Review of Systems: As per HPI otherwise all other systems reviewed and negative.   Past Medical History:  Diagnosis Date   Anxiety 10-2014   Bipolar disorder [facility]) age 79   COPD (chronic obstructive pulmonary disease) ([place]) 2016   Depression age 89   Enlarged prostate    Hyperlipidemia 2016   Hypertension 2013   Schizoaffective disorder ([place]) 11-13-14   Sleep apnea    Thyroid disease 11-2014   Past Surgical History:  Procedure Laterality Date   BALLOON DILATION N/A 11/07/2020   Procedure: BALLOON DILATION;  Surgeon: Irving Copas., MD;  Location: [place];  Service: Gastroenterology;  Laterality: N/A;   BILIARY STENT PLACEMENT N/A 10/10/2020   Procedure: BILIARY STENT PLACEMENT;  Surgeon: Rogene Houston, MD;  Location: [facility];  Service: Endoscopy;  Laterality: N/A;   BILIARY STENT PLACEMENT  11/09/2020   Procedure: BILIARY STENT PLACEMENT;  Surgeon: Rush Landmark Telford Nab., MD;  Location: [place];  Service: Gastroenterology;;   BILIARY STENT PLACEMENT  11/07/2020   Procedure: BILIARY STENT PLACEMENT;  Surgeon: Irving Copas., MD;  Location: [place];  Service: Gastroenterology;;   BILIARY STENT PLACEMENT  11/11/2020   Procedure: BILIARY STENT PLACEMENT;  Surgeon: Irving Copas., MD;  Location: [place];  Service: Gastroenterology;;   BILIARY STENT PLACEMENT  11/14/2020   Procedure: BILIARY STENT PLACEMENT;  Surgeon: Irving Copas., MD;  Location: [place];  Service: Gastroenterology;;   BIOPSY  11/07/2020   Procedure: BIOPSY;  Surgeon: Irving Copas., MD;  Location: [place];  Service: Gastroenterology;;   Wilmon Pali RELEASE Left 02/21/2016   Procedure: CARPAL TUNNEL RELEASE;  Surgeon: Carole Civil, MD;   Location: [facility];  Service: Orthopedics;  Laterality: Left;   CHOLECYSTECTOMY N/A 10/09/2020   Procedure: LAPAROSCOPIC CHOLECYSTECTOMY;  Surgeon: Virl Cagey, MD;  Location: [facility];  Service: General;  Laterality: N/A;   CYST ENTEROSTOMY N/A 11/07/2020   Procedure: CYST ENTEROSTOMY;  Surgeon: Irving Copas., MD;  Location: [place];  Service: Gastroenterology;  Laterality: N/A;   CYST GASTROSTOMY  11/11/2020   Procedure: CYST NECROSECTOMY;  Surgeon: Rush Landmark Telford Nab., MD;  Location: [place];  Service: Gastroenterology;;   CYST GASTROSTOMY  11/14/2020   Procedure: CYST NECROSECTOMY;  Surgeon: Irving Copas., MD;  Location: [facility] ENDOSCOPY;  Service: Gastroenterology;;   ENDOSCOPIC RETROGRADE CHOLANGIOPANCREATOGRAPHY (ERCP) WITH PROPOFOL N/A 11/09/2020   Procedure: ENDOSCOPIC RETROGRADE CHOLANGIOPANCREATOGRAPHY (ERCP) WITH PROPOFOL;  Surgeon: Irving Copas., MD;  Location: [place];  Service: Gastroenterology;  Laterality: N/A;   ERCP N/A 10/10/2020   Procedure: ENDOSCOPIC RETROGRADE CHOLANGIOPANCREATOGRAPHY (ERCP);  Surgeon: Rogene Houston, MD;  Location: [facility];  Service: Endoscopy;  Laterality: N/A;   ESOPHAGOGASTRODUODENOSCOPY N/A 11/11/2020   Procedure: ESOPHAGOGASTRODUODENOSCOPY (EGD);  Surgeon: Irving Copas., MD;  Location: [place];  Service: Gastroenterology;  Laterality: N/A;   ESOPHAGOGASTRODUODENOSCOPY N/A 11/14/2020   Procedure: ESOPHAGOGASTRODUODENOSCOPY (EGD);  Surgeon: Irving Copas., MD;  Location: [place];  Service: Gastroenterology;  Laterality: N/A;   ESOPHAGOGASTRODUODENOSCOPY (EGD) WITH PROPOFOL N/A 11/09/2020   Procedure: ESOPHAGOGASTRODUODENOSCOPY (EGD) WITH PROPOFOL;  Surgeon: Rush Landmark Telford Nab., MD;  Location: [place];  Service: Gastroenterology;  Laterality: N/A;   ESOPHAGOGASTRODUODENOSCOPY (EGD) WITH PROPOFOL N/A 11/07/2020   Procedure: ESOPHAGOGASTRODUODENOSCOPY (EGD) WITH PROPOFOL;  Surgeon:  Rush Landmark Telford Nab., MD;  Location: [place];  Service: Gastroenterology;  Laterality: N/A;   EUS  11/14/2020   Procedure: UPPER ENDOSCOPIC ULTRASOUND (EUS) LINEAR;  Surgeon: Irving Copas., MD;  Location: [facility] ENDOSCOPY;  Service: Gastroenterology;;   HERNIA REPAIR Left 2002   groin   INGUINAL HERNIA REPAIR Left 04/05/2017   Procedure: RECURRENT HERNIA REPAIR INGUINAL ADULT WITH MESH;  Surgeon: [name], MD;  Location: [facility];  Service: General;  Laterality: Left;   MASS EXCISION Right 04/29/2020   Procedure: EXCISION MASS RIGHT WRIST;  Surgeon: [name], MD;  Location: [place];  Service: Orthopedics;  Laterality: Right;   MASS EXCISION Right 10/09/2020   Procedure: EXCISION MASS, ABDOMINAL WALL, 2CM;  Surgeon: Virl Cagey, MD;  Location: [facility];  Service: General;  Laterality: Right;   SPHINCTEROTOMY N/A 10/10/2020   Procedure: Joan [place];  Surgeon: Rogene Houston, MD;  Location: [facility];  Service: Endoscopy;  Laterality: N/A;   STENT REMOVAL  11/09/2020   Procedure: STENT REMOVAL;  Surgeon: Irving Copas., MD;  Location: [place];  Service: Gastroenterology;;   Lavell Islam REMOVAL  11/11/2020   Procedure: STENT REMOVAL;  Surgeon: Irving Copas., MD;  Location: [place];  Service: Gastroenterology;;   Lavell Islam REMOVAL  11/14/2020   Procedure: STENT REMOVAL;  Surgeon: Irving Copas., MD;  Location: [place];  Service: Gastroenterology;;   UPPER ESOPHAGEAL ENDOSCOPIC ULTRASOUND (EUS) N/A 11/07/2020   Procedure: UPPER ESOPHAGEAL ENDOSCOPIC ULTRASOUND (EUS);  Surgeon: Irving Copas., MD;  Location: [place];  Service: Gastroenterology;  Laterality: N/A;   WOUND DEBRIDEMENT  11/09/2020   Procedure: CYST  NECROSECTOMY;  Surgeon: Mansouraty, Telford Nab., MD;  Location: [place];  Service: Gastroenterology;;   SOCIAL HISTORY  reports that he has been smoking cigarettes. He has a 6.50 pack-year smoking history. He  has never used smokeless tobacco. He reports that he does not drink alcohol and does not use drugs.  Allergies  Allergen Reactions   Desipramine Nausea Only and Rash   Family History  Problem Relation Age of Onset   Diabetes Father    Heart disease Father    Heart disease Maternal Grandmother    Stroke Maternal Grandfather    Prior to Admission medications   Medication Sig Start Date End Date Taking? Authorizing Provider  amoxicillin-clavulanate (AUGMENTIN) 875-125 MG tablet Take 1 tablet by mouth 2 (two) times daily. Start taking on June 11th 12/11/20  Yes Carlyle Basques, MD  feeding supplement (ENSURE ENLIVE / ENSURE PLUS) LIQD Take 237 mLs by mouth 3 (three) times daily between meals. Patient taking differently: Take 237 mLs by mouth 2 (two) times daily between meals. 11/23/20 12/23/20 Yes Lavina Hamman, MD  ibuprofen (ADVIL) 200 MG tablet Take 400 mg by mouth every 6 (six) hours as needed for fever.   Yes [provider]  Nystatin (GERHARDT'S BUTT CREAM) CREA Apply 1 application topically daily. Patient taking differently: Apply 1 application topically daily as needed for irritation. 11/23/20  Yes Lavina Hamman, MD  oxyCODONE (OXY IR/ROXICODONE) 5 MG immediate release tablet Take 1-2 tablets (5-10 mg total) by mouth every 4 (four) hours as needed for severe pain or breakthrough pain. 12/13/20  Yes Virl Cagey, MD  cyclobenzaprine (FLEXERIL) 10 MG tablet Take 0.5 tablets (5 mg total) by mouth 3 (three) times daily as needed for muscle spasms. Patient not taking: No sig reported 10/15/20   Virl Cagey, MD  docusate sodium (COLACE) 100 MG capsule Take 1 capsule (100 mg total) by mouth 2 (two) times daily as needed for mild constipation (while taking narcotics). Patient not taking: No sig reported 10/15/20   Virl Cagey, MD  docusate sodium (COLACE) 100 MG capsule Take 1 capsule (100 mg total) by mouth 2 (two) times daily. Patient not taking: No sig reported  11/24/20   Lavina Hamman, MD  ondansetron (ZOFRAN ODT) 4 MG disintegrating tablet Take 1 tablet (4 mg total) by mouth every 8 (eight) hours as needed for nausea or vomiting. Patient not taking: No sig reported 10/15/20   Virl Cagey, MD  polyethylene glycol (MIRALAX / GLYCOLAX) 17 g packet Take 17 g by mouth daily. Patient not taking: No sig reported 11/24/20   Lavina Hamman, MD  risperiDONE (RISPERDAL) 1 MG tablet Take 1 tablet (1 mg total) by mouth at bedtime. Further fills to psychiatry Patient not taking: No sig reported 10/31/20   [name], DO  traZODone (DESYREL) 150 MG tablet Take 1-2 tablets (150-300 mg total) by mouth at bedtime. Further fills per psychiatry Patient not taking: No sig reported 10/31/20   [name], DO   Physical Exam: Vitals:   12/20/20 1630 12/20/20 1640 12/20/20 1650 12/20/20 1700  BP: (!) 150/69 (!) 157/90 (!) 165/82 (!) 157/84  Pulse: (!) 102 (!) 101 95 88  Resp: (!) 21 18 20 13   Temp:      TempSrc:      SpO2: (!) 88% 91% 96% 95%  Weight:      Height:       Constitutional: WN/WD obese Caucasian male in NAD and appears calm  Eyes: Lids and conjunctivae normal, sclerae anicteric  ENMT: External Ears, Nose appear normal. Grossly normal hearing.  Neck: Appears normal, supple, no cervical masses, normal ROM, no appreciable thyromegaly; no JVD Respiratory: Diminished to auscultation bilaterally with coarse breath sounds, no wheezing, rales, rhonchi or crackles. Normal respiratory effort and patient is not tachypenic. No accessory muscle use. Was placed on Supplemental O2 via  in the Endoscopy Suite  Cardiovascular: RRR, no murmurs / rubs / gallops. S1 and S2 auscultated. 1+ LE extremity edema.  Abdomen: Soft, non-tender, Distended 2/2 body habitus. JP drain on the Right side of his Abdomen with Clear Yellow Fluid. Bowel sounds positive.  GU: Deferred. Musculoskeletal: No clubbing / cyanosis of digits/nails. No joint deformity upper  and lower extremities.  Skin: No rashes, lesions, ulcers on a limited skin evaluation. No induration; Warm and dry.  Neurologic: CN 2-12 grossly intact with no focal deficits. Romberg sign and cerebellar reflexes not assessed.  Psychiatric: Normal judgment and insight. Alert and oriented x 3. Normal mood and appropriate affect.   Labs on Admission: I have personally reviewed following labs and imaging studies  CBC: No results for input(s): WBC, NEUTROABS, HGB, HCT, MCV, PLT in the last 168 hours. Basic Metabolic Panel: No results for input(s): NA, K, CL, CO2, GLUCOSE, BUN, CREATININE, CALCIUM, MG, PHOS in the last 168 hours. GFR: Estimated Creatinine Clearance: 138.1 mL/min (by C-G formula based on SCr of 0.6 mg/dL). Liver Function Tests: No results for input(s): AST, ALT, ALKPHOS, BILITOT, PROT, ALBUMIN in the last 168 hours. No results for input(s): LIPASE, AMYLASE in the last 168 hours. No results for input(s): AMMONIA in the last 168 hours. Coagulation Profile: No results for input(s): INR, PROTIME in the last 168 hours. Cardiac Enzymes: No results for input(s): CKTOTAL, CKMB, CKMBINDEX, TROPONINI in the last 168 hours. BNP (last 3 results) No results for input(s): PROBNP in the last 8760 hours. HbA1C: No results for input(s): HGBA1C in the last 72 hours. CBG: No results for input(s): GLUCAP in the last 168 hours. Lipid Profile: No results for input(s): CHOL, HDL, LDLCALC, TRIG, CHOLHDL, LDLDIRECT in the last 72 hours. Thyroid Function Tests: No results for input(s): TSH, T4TOTAL, FREET4, T3FREE, THYROIDAB in the last 72 hours. Anemia Panel: No results for input(s): VITAMINB12, FOLATE, FERRITIN, TIBC, IRON, RETICCTPCT in the last 72 hours. Urine analysis:    Component Value Date/Time   COLORURINE YELLOW 04/08/2017 1603   APPEARANCEUR CLEAR 04/08/2017 1603   LABSPEC 1.019 04/08/2017 1603   PHURINE 6.0 04/08/2017 1603   GLUCOSEU NEGATIVE 04/08/2017 1603   HGBUR NEGATIVE  04/08/2017 1603   BILIRUBINUR NEGATIVE 04/08/2017 1603   KETONESUR NEGATIVE 04/08/2017 1603   PROTEINUR NEGATIVE 04/08/2017 1603   UROBILINOGEN 0.2 10/24/2014 1136   NITRITE NEGATIVE 04/08/2017 1603   LEUKOCYTESUR NEGATIVE 04/08/2017 1603   Sepsis Labs: !!!!!!!!!!!!!!!!!!!!!!!!!!!!!!!!!!!!!!!!!!!! @LABRCNTIP (procalcitonin:4,lacticidven:4) )No results found for this or any previous visit (from the past 240 hour(s)).   Radiological Exams on Admission: DG ERCP  Result Date: 12/20/2020 CLINICAL DATA:  50 year old male with post cholecystectomy bile leak. EXAM: ERCP TECHNIQUE: Multiple spot images obtained with the fluoroscopic device and submitted for interpretation post-procedure. FLUOROSCOPY TIME:  See procedural note for further detail COMPARISON:  None. FINDINGS: Intraoperative seen a images are submitted for review. The images demonstrate a flexible duodenal scope in the descending duodenum with wire cannulation of the intrahepatic ducts and cholangiogram including pull-back balloon occluded cholangiogram. There is moderate intra and extrahepatic biliary ductal dilatation. However, no evidence of bile leak.  Cholecystectomy clips are visualized at the site of the cystic duct stump. A JP drain is also present in the right upper quadrant. IMPRESSION: ERCP as above.  No visualized bile leak. These images were submitted for radiologic interpretation only. Please see the procedural report for the amount of contrast and the fluoroscopy time utilized. Electronically Signed   By: Jacqulynn Cadet M.D.   On: 12/20/2020 16:10    EKG: No EKG Done so will order one.   Assessment/Plan Active Problems:   Hemorrhage following endoscopic retrograde cholangiopancreatography (ERCP)  Post ERCP bleeding -Place in observation telemetry -The GI physician is recommending clear liquid diet for now and will be obtaining a CT scan of the abdomen pelvis -Check hemoglobin and hematocrit carefully and will obtain labs  currently has no labs have been done; will trend hemoglobin/hematocrit every 6h -Start IV fluid hydration with lactated Ringer's at 75 MLS per hour -Hold off pharmacological prophylaxis -Check COVID screen as it has not been done -GI  consulted and appreciate further evaluation and recommendations  History of post ERCP pancreatitis with pancreatic necrosis and persistent bile leak status post recent cholecystectomy and necrosectomy via cystogastrostomy and JP drain placement -Has received IV meropenem and has been transitioned to p.o. Augmentin and will continue p.o. Augmentin -We will start IV fluid hydration -Check a lipase level -Continue to have significant drainage from the JP indicative of persistent bile leak despite having common bile duct stents in place; his AXIOS stent was removed today by GI  -GI feels they have done all the care for his persistent bile leak endoscopically and requesting a surgical evaluation to see if anything can be done here -Continue with pain control and supportive care  Acute Respiratory Failure with Hypoxia -Post-ERCP  -Will check CXR -SpO2: 95 % O2 Flow Rate (L/min): 3 L/min -Check COVID and add Nebs -Flutter and Incentive  -Continuous Pulse Oximetry and Maintain O2 Sats >90%  -Continue Supplemental O2 via [place] and Wean O2 as Tolerated  Hypertension -Does not appear to be on any antihypertensives currently -Add PRN Hydralazine -Continue to Monitor BP per Protocol -Last BP was 157/84  COPD -Check Admission CXR -IF necessary will place on Supplemnetal O2 -Add PRN Nebs   Depression/anxiety/bipolar disorder -No longer taking risperidone 1 mg nightly  Obesity -Complicates overall prognosis and care -Estimated body mass index is 34.44 kg/m as calculated from the following:   Height as of this encounter: 5\' 10"  (1.778 m).   Weight as of this encounter: 108.9 kg. -Weight Loss and Dietary counseling given   Severe Protein Calorie  Malnutrition -Check Pre-Albumin -Continue Supplements -Consult Nutrition   DVT prophylaxis: SCDs Code Status: FULL CODE Family Communication: Discussed with Sister at bedside  Disposition Plan: Admission Telemetry  Consults called: GI; Will send message to  Admission status: Obs Telemetry   Severity of Illness: The appropriate patient status for this patient is OBSERVATION. Observation status is judged to be reasonable and necessary in order to provide the required intensity of service to ensure the patient's safety. The patient's presenting symptoms, physical exam findings, and initial radiographic and laboratory data in the context of their medical condition is felt to place them at decreased risk for further clinical deterioration. Furthermore, it is anticipated that the patient will be medically stable for discharge from the hospital within 2 midnights of admission. The following factors support the patient status of observation.   " The patient's presenting symptoms include Post-ERCP Bleeding . " The physical exam findings include JP  Drain with Output; LE Edema  " The initial radiographic and laboratory data are pending.   Kerney Elbe, D.O. [facility] PAGER is on [place]  If 7PM-7AM, please contact night-coverage [email or web address]  12/20/2020, 5:46 PM

## 2020-12-20 NOTE — Op Note (Signed)
[facility] Patient Name: Steve Romero Procedure Date: 12/20/2020 MRN: [id number] Attending MD: Justice Britain , MD Date of Birth: Nov 06, 1970 CSN: [id number] Age: 50 Admit Type: Outpatient Procedure:                ERCP Indications:              Acute pancreatitis, Follow-up of acute                            pancreatitis, Gallstone associated acute                            pancreatitis, Post ERCP acute pancreatitis, Stent                            change Providers:                Justice Britain, MD, Carlyn Reichert, RN, Cherylynn Ridges, Technician, [facility], CRNA Referring MD:             Lanell Matar. Clovis Cao M. Gottschalk Medicines:                General Anesthesia, Indomethacin 100 mg PR, Cipro                            400 mg IV, Glucagon 3.81 mg IV Complications:            No immediate complications. Estimated Blood Loss:     Estimated blood loss was minimal. Procedure:                Pre-Anesthesia Assessment:                           - Prior to the procedure, a History and Physical                            was performed, and patient medications and                            allergies were reviewed. The patient's tolerance of                            previous anesthesia was also reviewed. The risks                            and benefits of the procedure and the sedation                            options and risks were discussed with the patient.                            All questions were answered, and informed consent  was obtained. Prior Anticoagulants: The patient has                            taken no previous anticoagulant or antiplatelet                            agents. ASA Grade Assessment: III - A patient with                            severe systemic disease. After reviewing the risks                            and benefits, the patient was deemed in                             satisfactory condition to undergo the procedure.                           After obtaining informed consent, the scope was                            passed under direct vision. Throughout the                            procedure, the patient's blood pressure, pulse, and                            oxygen saturations were monitored continuously. The                            [id number]-Q180V ([id number]) Olympus Duodenoscope was                            introduced through the mouth, and used to inject                            contrast into and used for direct visualization of                            the bile duct. The [id number]-[id number] ([id number]) Olympus                            therapeutic endoscope was introduced through the                            mouth, and used to inject contrast into and used to                            locate the major papilla. The ERCP was technically                            difficult and complex. The patient tolerated the  procedure. Scope In: Scope Out: Findings:      A scout film of the abdomen was obtained. Surgical clips, consistent       with previous cholecystectomy, were seen in the area of the right upper       quadrant of the abdomen. One percutaneous drain ending in the Right       upper quadrant was seen. Two stents ending in the main bile duct were       seen. One metal cystgastrostomy stent and a double pigtail stent ending       in the mid-abdomen was seen.      The esophagus was successfully intubated under direct vision without       detailed examination of the pharynx, larynx, and associated structures.       A therapeutic esophagogastroduodenoscopy scope was used for the       examination of the upper gastrointestinal tract. The scope was passed       under direct vision through the upper GI tract. No gross lesions were       noted in the proximal esophagus. White nummular lesions were noted in        the mid esophagus and in the distal esophagus - biopsied to rule out       Candida. The Z-line was irregular and was found 39 cm from the incisors.       Patchy mildly erythematous mucosa without bleeding was found in the       entire examined stomach. A previously placed AXIOS cystgastrostomy stent       and a double pigtail stent were found at the incisura. An angulation       deformity vs extrinsic impression was found in the gastric body. Patchy       mildly erythematous mucosa without active bleeding and with no stigmata       of bleeding was found in the duodenal bulb, in the first portion of the       duodenum and in the second portion of the duodenum. A biliary       sphincterotomy had been performed. The sphincterotomy appeared open. Two       biliary stents originating in the biliary tree were emerging from the       major papilla. The stents were visibly patent. Two stents were removed       from the biliary tree using a Raptor grasping device.      A short 0.035 inch Soft Jagwire was passed into the biliary tree. The       Hydratome sphincterotome was passed over the guidewire and the bile duct       was then deeply cannulated. Contrast was injected. I personally       interpreted the bile duct images. Ductal flow of contrast was adequate.       Image quality was excellent. Contrast extended to the hepatic ducts.       Opacification of the entire biliary tree except for the gallbladder was       successful. The middle third of the main bile duct and upper third of       the main bile duct were moderately dilated. The largest diameter was 15       mm. The lower third of the main bile duct was normal. To discover       objects, the biliary tree was swept with a retrieval balloon starting at  the bifurcation. Sludge was swept from the duct. An occlusion       cholangiogram was performed that showed no further significant biliary       pathology and no evidence of a leak under  significant pressure. I wanted       to ensure I wasn't missing a cystic duct leak, so the bile duct was       explored endoscopically using the SpyGlass direct visualization system.       The SpyScope was advanced to the bifurcation. Visibility with the scope       was good. The middle third of the main bile duct and upper third of the       main bile duct were normal. Abnormal mucosa characterized by congestion       and erythema was found in the lower third of the main bile duct. A long       0.025 inch Antonietta Breach was passed into the cystic duct region under       selective cannulation. An occlusion cholangiogram was then performed       that showed no further significant biliary pathology and no leak at the       cystic duct stump.      A pancreatogram was not performed.      The duodenoscope was withdrawn from the patient.      The double pigtail stent and AXIOS cystgastrostomy stents were removed       from the cyst using a Raptor grasping device. There was some increased       amount of bleeding (more than would be expected). After monitoring the       area for 10-minutes the region stopped oozing. Suspect that this was due       to the embedment of the AXIOS within the cyst and wall of the stomach       rather than pseudoaneurysm.      The endoscope was withdrawn from the patient. Impression:               - No gross lesions in esophagus proximally. White                            nummular lesions in esophageal mucosa - biopsied.                           - Z-line irregular, 39 cm from the incisors.                           - Erythematous mucosa in the stomach.                           - AXIOS cystgastrostomy stent and double pigtail -                            removed. More oozing/bleeding than normally                            expected but this subsided with time. Cyst cavity                            looked good and clean.                           -  Angulation deformity in  the gastric body vs                            extrinsic impression.                           - Erythematous duodenopathy.                           - Prior biliary sphincterotomy appeared open.                           - Two visibly patent stents from the biliary tree                            were seen in the major papilla. These were removed.                           - Congested biliary tract mucosa was found.                           - The upper third of the main bile duct and middle                            third of the main bile duct were moderately dilated.                           - The biliary tree was swept and sludge was found.                           - No evidence of biliary leak noted upon balloon                            occlusion and super-selective cannulation of cystic                            duct. Moderate Sedation:      Not Applicable - Patient had care per Anesthesia. Recommendation:           - The patient will be observed post-procedure,                            until all discharge criteria are met.                           - Admit the patient to hospital ward for                            observation.                           - Observe patient's clinical course.                           - Await path results.                           -  Due to increased amount of oozing, more than                            expected, I would like to have the patient come                            into the hospital for monitoring/observation. As                            well, this patient now has a biliary leak for which                            biliary stenting seems unlikely to be helpful at                            this time as I suspect that this is very peripheral.                           - I could consider a Fully Covered SEMS and then                            try again to get into the right hepatic duct and                            leave a  long stent, but even under direct pressure                            to the right hepatic duct nothing filled to note to                            suggest leakage which is reasoning I left nothing                            today.                           - CTAP with IV/PO contrast to define anatomy and                            see where things stand if another collection needs                            drainage or JP drain in abnormal positioning.                           - Will ask Surgical Service to opine about next                            steps in treatment.                           - May ask IR team to opine as well,  based on                            possible role of HIDA scan vs Drain injection to                            see what could be going on as well.                           - The findings and recommendations were discussed                            with the patient.                           - The findings and recommendations were discussed                            with the designated responsible adult.                           - The findings and recommendations were discussed                            with the patient's family.                           - The findings and recommendations were discussed                            with the referring physician.                           - Discussed with medical service. Procedure Code(s):        --- Professional ---                           [phone], Endoscopic retrograde                            cholangiopancreatography (ERCP); with removal of                            foreign body(s) or stent(s) from biliary/pancreatic                            duct(s)                           43264, Endoscopic retrograde                            cholangiopancreatography (ERCP); with removal of                            calculi/debris from biliary/pancreatic duct(s)  43273, Endoscopic  cannulation of papilla with                            direct visualization of pancreatic/common bile                            duct(s) (List separately in addition to code(s) for                            primary procedure) Diagnosis Code(s):        --- Professional ---                           K22.8, Other specified diseases of esophagus                           K31.89, Other diseases of stomach and duodenum                           Z97.8, Presence of other specified devices                           Z96.89, Presence of other specified functional                            implants                           K83.9, Disease of biliary tract, unspecified                           Z46.59, Encounter for fitting and adjustment of                            other gastrointestinal appliance and device                           K85.90, Acute pancreatitis without necrosis or                            infection, unspecified                           K85.10, Biliary acute pancreatitis without necrosis                            or infection                           K85.80, Other acute pancreatitis without necrosis                            or infection                           K91.89, Other postprocedural complications and  disorders of digestive system                           K83.8, Other specified diseases of biliary tract CPT copyright 2019 [employer]. All rights reserved. The codes documented in this report are preliminary and upon coder review may  be revised to meet current compliance requirements. Justice Britain, MD 12/20/2020 6:07:46 PM Number of Addenda: 0

## 2020-12-20 NOTE — Interval H&P Note (Signed)
History and Physical Interval Note:  12/20/2020 1:41 PM  Steve Romero  has presented today for surgery, with the diagnosis of pancreatic necrosis.  The various methods of treatment have been discussed with the patient and family. After consideration of risks, benefits and other options for treatment, the patient has consented to  Procedure(s): ESOPHAGOGASTRODUODENOSCOPY (EGD) WITH PROPOFOL (N/A) ENDOSCOPIC RETROGRADE CHOLANGIOPANCREATOGRAPHY (ERCP) WITH PROPOFOL (N/A) as a surgical intervention.  The patient's history has been reviewed, patient examined, no change in status, stable for surgery.  I have reviewed the patient's chart and labs.  Questions were answered to the patient's satisfaction.   Possible Endoscopic ultrasound.  The risks of an EUS including intestinal perforation, bleeding, infection, aspiration, and medication effects were discussed as was the possibility it may not give a definitive diagnosis if a biopsy is performed.  When a biopsy of the pancreas is done as part of the EUS, there is an additional risk of pancreatitis at the rate of about 1-2%.  It was explained that procedure related pancreatitis is typically mild, although it can be severe and even life threatening, which is why we do not perform random pancreatic biopsies and only biopsy a lesion/area we feel is concerning enough to warrant the risk.   [employer]

## 2020-12-20 NOTE — Anesthesia Postprocedure Evaluation (Signed)
Anesthesia Post Note  Patient: Steve Romero  Procedure(s) Performed: ESOPHAGOGASTRODUODENOSCOPY (EGD) WITH PROPOFOL ENDOSCOPIC RETROGRADE CHOLANGIOPANCREATOGRAPHY (ERCP) WITH PROPOFOL BIOPSY STENT REMOVAL GASTROINTESTINAL STENT REMOVAL SPYGLASS CHOLANGIOSCOPY REMOVAL OF STONES     Patient location during evaluation: PACU Anesthesia Type: General Level of consciousness: awake and alert Pain management: pain level controlled Vital Signs Assessment: post-procedure vital signs reviewed and stable Respiratory status: spontaneous breathing, nonlabored ventilation, respiratory function stable and patient connected to nasal cannula oxygen Cardiovascular status: blood pressure returned to baseline and stable Postop Assessment: no apparent nausea or vomiting Anesthetic complications: no   No notable events documented.  Last Vitals:  Vitals:   12/20/20 1700 12/20/20 1753  BP: (!) 157/84 (!) 143/95  Pulse: 88 84  Resp: 13 17  Temp:  36.5 C  SpO2: 95% 94%    Last Pain:  Vitals:   12/20/20 1757  TempSrc:   PainSc: 4                  Shilee Biggs L Maybree Riling

## 2020-12-20 NOTE — Transfer of Care (Signed)
Immediate Anesthesia Transfer of Care Note  Patient: Steve Romero  Procedure(s) Performed: Procedure(s): ESOPHAGOGASTRODUODENOSCOPY (EGD) WITH PROPOFOL (N/A) ENDOSCOPIC RETROGRADE CHOLANGIOPANCREATOGRAPHY (ERCP) WITH PROPOFOL (N/A)  Patient Location: PACU  Anesthesia Type:General  Level of Consciousness: Alert, Awake, Oriented  Airway & Oxygen Therapy: Patient Spontanous Breathing  Post-op Assessment: Report given to RN  Post vital signs: Reviewed and stable  Last Vitals:  Vitals:   12/20/20 1227  BP: 133/77  Pulse: 87  Resp: 11  Temp: 36.8 C  SpO2: 57%    Complications: No apparent anesthesia complications

## 2020-12-21 DIAGNOSIS — D72829 Elevated white blood cell count, unspecified: Secondary | ICD-10-CM | POA: Diagnosis not present

## 2020-12-21 DIAGNOSIS — K91840 Postprocedural hemorrhage and hematoma of a digestive system organ or structure following a digestive system procedure: Secondary | ICD-10-CM | POA: Diagnosis not present

## 2020-12-21 DIAGNOSIS — K839 Disease of biliary tract, unspecified: Secondary | ICD-10-CM | POA: Diagnosis not present

## 2020-12-21 DIAGNOSIS — K2289 Other specified disease of esophagus: Secondary | ICD-10-CM | POA: Diagnosis not present

## 2020-12-21 LAB — COMPREHENSIVE METABOLIC PANEL
ALT: 17 U/L (ref 0–44)
AST: 18 U/L (ref 15–41)
Albumin: 1.9 g/dL — ABNORMAL LOW (ref 3.5–5.0)
Alkaline Phosphatase: 221 U/L — ABNORMAL HIGH (ref 38–126)
Anion gap: 11 (ref 5–15)
BUN: 12 mg/dL (ref 6–20)
CO2: 31 mmol/L (ref 22–32)
Calcium: 8.6 mg/dL — ABNORMAL LOW (ref 8.9–10.3)
Chloride: 96 mmol/L — ABNORMAL LOW (ref 98–111)
Creatinine, Ser: 0.96 mg/dL (ref 0.61–1.24)
GFR, Estimated: >60 mL/min (ref >60)
Glucose, Bld: 120 mg/dL — ABNORMAL HIGH (ref 70–99)
Potassium: 4.4 mmol/L (ref 3.5–5.1)
Sodium: 138 mmol/L (ref 135–145)
Total Bilirubin: 0.4 mg/dL (ref 0.3–1.2)
Total Protein: 6.5 g/dL (ref 6.5–8.1)

## 2020-12-21 LAB — CBC WITH DIFFERENTIAL/PLATELET
Abs Immature Granulocytes: 0.21 10*3/uL — ABNORMAL HIGH (ref 0.00–0.07)
Basophils Absolute: 0 10*3/uL (ref 0.0–0.1)
Basophils Relative: 0 %
Eosinophils Absolute: 0 10*3/uL (ref 0.0–0.5)
Eosinophils Relative: 0 %
HCT: 33.3 % — ABNORMAL LOW (ref 39.0–52.0)
Hemoglobin: 10.4 g/dL — ABNORMAL LOW (ref 13.0–17.0)
Immature Granulocytes: 1 %
Lymphocytes Relative: 8 %
Lymphs Abs: 1.6 10*3/uL (ref 0.7–4.0)
MCH: 28 pg (ref 26.0–34.0)
MCHC: 31.2 g/dL (ref 30.0–36.0)
MCV: 89.8 fL (ref 80.0–100.0)
Monocytes Absolute: 1 10*3/uL (ref 0.1–1.0)
Monocytes Relative: 5 %
Neutro Abs: 16.5 10*3/uL — ABNORMAL HIGH (ref 1.7–7.7)
Neutrophils Relative %: 86 %
Platelets: 604 10*3/uL — ABNORMAL HIGH (ref 150–400)
RBC: 3.71 MIL/uL — ABNORMAL LOW (ref 4.22–5.81)
RDW: 15.4 % (ref 11.5–15.5)
WBC: 19.4 10*3/uL — ABNORMAL HIGH (ref 4.0–10.5)
nRBC: 0 % (ref 0.0–0.2)

## 2020-12-21 LAB — CBC
HCT: 34.7 % — ABNORMAL LOW (ref 39.0–52.0)
Hemoglobin: 10.9 g/dL — ABNORMAL LOW (ref 13.0–17.0)
MCH: 28.2 pg (ref 26.0–34.0)
MCHC: 31.4 g/dL (ref 30.0–36.0)
MCV: 89.9 fL (ref 80.0–100.0)
Platelets: 582 10*3/uL — ABNORMAL HIGH (ref 150–400)
RBC: 3.86 MIL/uL — ABNORMAL LOW (ref 4.22–5.81)
RDW: 15.3 % (ref 11.5–15.5)
WBC: 16 10*3/uL — ABNORMAL HIGH (ref 4.0–10.5)
nRBC: 0 % (ref 0.0–0.2)

## 2020-12-21 LAB — HEMOGLOBIN AND HEMATOCRIT, BLOOD
HCT: 33.5 % — ABNORMAL LOW (ref 39.0–52.0)
HCT: 35 % — ABNORMAL LOW (ref 39.0–52.0)
Hemoglobin: 10.5 g/dL — ABNORMAL LOW (ref 13.0–17.0)
Hemoglobin: 10.8 g/dL — ABNORMAL LOW (ref 13.0–17.0)

## 2020-12-21 LAB — GLUCOSE, CAPILLARY: Glucose-Capillary: 119 mg/dL — ABNORMAL HIGH (ref 70–99)

## 2020-12-21 SURGERY — ERCP, WITH INTERVENTION IF INDICATED
Anesthesia: General

## 2020-12-21 MED ORDER — METRONIDAZOLE 500 MG PO TABS: 500.0000 mg | ORAL_TABLET | Freq: Three times a day (TID) | ORAL | 0 refills | Status: AC

## 2020-12-21 MED ORDER — ONDANSETRON HCL 4 MG PO TABS: 4.0000 mg | ORAL_TABLET | Freq: Four times a day (QID) | ORAL | 0 refills | Status: DC | PRN

## 2020-12-21 MED ORDER — CIPROFLOXACIN HCL 500 MG PO TABS: 500.0000 mg | ORAL_TABLET | Freq: Two times a day (BID) | ORAL | 0 refills | Status: AC

## 2020-12-21 MED ORDER — AMOXICILLIN-POT CLAVULANATE 875-125 MG PO TABS: 1.0000 | ORAL_TABLET | Freq: Two times a day (BID) | ORAL | 0 refills | Status: AC

## 2020-12-21 MED ORDER — ACETAMINOPHEN 325 MG PO TABS: 650.0000 mg | ORAL_TABLET | Freq: Four times a day (QID) | ORAL | 0 refills | Status: DC | PRN

## 2020-12-21 NOTE — Consult Note (Signed)
Reason for Consult: Persistent bile leak after laparoscopic cholecystectomy Referring Physician: Mansouraty MD   Steve Romero is an 50 y.o. male.  HPI: Asked to see patient for evaluation of persistent bile leak status post laparoscopic cholecystectomy in April 2022 by Dr. Constance Haw at [facility].  This was for severe emphysematous cholecystitis with severe gangrene.  The cystic duct was quite necrotic.  He was noted to have bile leakage during the procedure therefore a ERCP was performed postop day 1 which showed leakage from the cystic duct.  Sphincterotomy and stent were placed by ERCP.  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  He was admitted for ERCP yesterday but had some bleeding and was kept overnight.  Surgery was asked to evaluate for this persistent bile leak.  He is in no pain at this point time.  His hemoglobin this morning is stable.  His JP drain has not been recorded but this morning look relatively clear and serosanguineous in nature with not any significant gross amounts of bile.  Past Medical History:  Diagnosis Date   Anxiety 10-2014   Bipolar disorder [facility]) age 40   COPD (chronic obstructive pulmonary disease) ([place]) 2016   Depression age 48   Enlarged prostate    Hyperlipidemia 2016   Hypertension 2013   Schizoaffective disorder ([place]) 11-13-14   Sleep apnea    Thyroid disease 11-2014    Past Surgical History:  Procedure Laterality Date   BALLOON DILATION N/A 11/07/2020   Procedure: BALLOON DILATION;  Surgeon: Irving Copas., MD;  Location: [place];  Service: Gastroenterology;  Laterality: N/A;   BILIARY STENT PLACEMENT N/A 10/10/2020   Procedure: BILIARY STENT PLACEMENT;  Surgeon: Rogene Houston, MD;  [facility];  Service: Endoscopy;  Laterality: N/A;   BILIARY STENT PLACEMENT  11/09/2020   Procedure: BILIARY STENT PLACEMENT;  Surgeon: Rush Landmark Telford Nab., MD;  Location: [place];  Service: Gastroenterology;;   BILIARY STENT PLACEMENT  11/07/2020   Procedure: BILIARY STENT PLACEMENT;  Surgeon: Irving Copas., MD;  Location: [place];  Service: Gastroenterology;;   BILIARY STENT PLACEMENT  11/11/2020   Procedure: BILIARY STENT PLACEMENT;  Surgeon: Irving Copas., MD;  Location: [place];  Service: Gastroenterology;;   BILIARY STENT PLACEMENT  11/14/2020   Procedure: BILIARY STENT PLACEMENT;  Surgeon: Irving Copas., MD;  Location: [place];  Service: Gastroenterology;;   BIOPSY  11/07/2020   Procedure: BIOPSY;  Surgeon: Irving Copas., MD;  Location: [place];  Service: Gastroenterology;;   CARPAL TUNNEL RELEASE Left 02/21/2016   Procedure: CARPAL TUNNEL RELEASE;  Surgeon: Carole Civil, MD;  [facility];  Service: Orthopedics;  Laterality: Left;   CHOLECYSTECTOMY N/A 10/09/2020   Procedure: LAPAROSCOPIC CHOLECYSTECTOMY;  Surgeon: Virl Cagey, MD;  Location: [facility];  Service: General;  Laterality: N/A;   CYST ENTEROSTOMY N/A 11/07/2020   Procedure: CYST ENTEROSTOMY;  Surgeon: Irving Copas., MD;  Location: [place];  Service: Gastroenterology;  Laterality: N/A;   CYST GASTROSTOMY  11/11/2020   Procedure: CYST NECROSECTOMY;  Surgeon: Rush Landmark Telford Nab., MD;  Location: [place];  Service: Gastroenterology;;   CYST GASTROSTOMY  11/14/2020   Procedure: CYST NECROSECTOMY;  Surgeon: Irving Copas., MD;  Location: [place];  Service: Gastroenterology;;   ENDOSCOPIC RETROGRADE CHOLANGIOPANCREATOGRAPHY (ERCP) WITH  PROPOFOL N/A 11/09/2020   Procedure: ENDOSCOPIC RETROGRADE CHOLANGIOPANCREATOGRAPHY (ERCP) WITH PROPOFOL;  Surgeon: Rush Landmark Telford Nab., MD;  Location: [place];  Service: Gastroenterology;   Laterality: N/A;   ERCP N/A 10/10/2020   Procedure: ENDOSCOPIC RETROGRADE CHOLANGIOPANCREATOGRAPHY (ERCP);  Surgeon: Rogene Houston, MD;  Location: [facility];  Service: Endoscopy;  Laterality: N/A;   ESOPHAGOGASTRODUODENOSCOPY N/A 11/11/2020   Procedure: ESOPHAGOGASTRODUODENOSCOPY (EGD);  Surgeon: Irving Copas., MD;  Location: [place];  Service: Gastroenterology;  Laterality: N/A;   ESOPHAGOGASTRODUODENOSCOPY N/A 11/14/2020   Procedure: ESOPHAGOGASTRODUODENOSCOPY (EGD);  Surgeon: Irving Copas., MD;  Location: [place];  Service: Gastroenterology;  Laterality: N/A;   ESOPHAGOGASTRODUODENOSCOPY (EGD) WITH PROPOFOL N/A 11/09/2020   Procedure: ESOPHAGOGASTRODUODENOSCOPY (EGD) WITH PROPOFOL;  Surgeon: Rush Landmark Telford Nab., MD;  Location: [place];  Service: Gastroenterology;  Laterality: N/A;   ESOPHAGOGASTRODUODENOSCOPY (EGD) WITH PROPOFOL N/A 11/07/2020   Procedure: ESOPHAGOGASTRODUODENOSCOPY (EGD) WITH PROPOFOL;  Surgeon: Rush Landmark Telford Nab., MD;  Location: [place];  Service: Gastroenterology;  Laterality: N/A;   EUS  11/14/2020   Procedure: UPPER ENDOSCOPIC ULTRASOUND (EUS) LINEAR;  Surgeon: Irving Copas., MD;  Location: [facility] ENDOSCOPY;  Service: Gastroenterology;;   HERNIA REPAIR Left 2002   groin   INGUINAL HERNIA REPAIR Left 04/05/2017   Procedure: RECURRENT HERNIA REPAIR INGUINAL ADULT WITH MESH;  Surgeon: [name], MD;  Location: [facility];  Service: General;  Laterality: Left;   MASS EXCISION Right 04/29/2020   Procedure: EXCISION MASS RIGHT WRIST;  Surgeon: [name], MD;  Location: [place];  Service: Orthopedics;  Laterality: Right;   MASS EXCISION Right 10/09/2020   Procedure: EXCISION MASS, ABDOMINAL WALL, 2CM;  Surgeon: Virl Cagey, MD;  Location: [facility];  Service: General;  Laterality: Right;   SPHINCTEROTOMY N/A 10/10/2020   Procedure: Joan Mayans;  Surgeon: Rogene Houston, MD;  Location[facility];  Service:  Endoscopy;  Laterality: N/A;   STENT REMOVAL  11/09/2020   Procedure: STENT REMOVAL;  Surgeon: Irving Copas., MD;  Location: [place];  Service: Gastroenterology;;   Lavell Islam REMOVAL  11/11/2020   Procedure: STENT REMOVAL;  Surgeon: Irving Copas., MD;  Location: [place];  Service: Gastroenterology;;   Lavell Islam REMOVAL  11/14/2020   Procedure: STENT REMOVAL;  Surgeon: Irving Copas., MD;  Location: [place];  Service: Gastroenterology;;   UPPER ESOPHAGEAL ENDOSCOPIC ULTRASOUND (EUS) N/A 11/07/2020   Procedure: UPPER ESOPHAGEAL ENDOSCOPIC ULTRASOUND (EUS);  Surgeon: Irving Copas., MD;  Location: [place];  Service: Gastroenterology;  Laterality: N/A;   WOUND DEBRIDEMENT  11/09/2020   Procedure: CYST NECROSECTOMY;  Surgeon: Rush Landmark Telford Nab., MD;  Location: [facility] ENDOSCOPY;  Service: Gastroenterology;;    Family History  Problem Relation Age of Onset   Diabetes Father    Heart disease Father    Heart disease Maternal Grandmother    Stroke Maternal Grandfather     Social History:  reports that he has been smoking cigarettes. He has a 6.50 pack-year smoking history. He has never used smokeless tobacco. He reports that he does not drink alcohol and does not use drugs.  Allergies:  Allergies  Allergen Reactions   Desipramine Nausea Only and Rash    Medications: I have reviewed the patient's current medications.  Results for orders placed or performed during the hospital encounter of 12/20/20 (from the past 48 hour(s))  HIV Antibody (routine testing w rflx)     Status: None   Collection Time: 12/20/20  6:15 PM  Result Value Ref Range   HIV Screen  4th Generation wRfx Non Reactive Non Reactive    Comment: Performed at [facility], [place] [address], [place], [place] [address]  Comprehensive metabolic panel     Status: Abnormal   Collection Time: 12/20/20  6:15 PM  Result Value Ref Range   Sodium 135 135 - 145 mmol/L   Potassium 4.1 3.5 -  5.1 mmol/L   Chloride 95 (L) 98 - 111 mmol/L   CO2 30 22 - 32 mmol/L   Glucose, Bld 141 (H) 70 - 99 mg/dL    Comment: Glucose reference range applies only to samples taken after fasting for at least 8 hours.   BUN 11 6 - 20 mg/dL   Creatinine, Ser 1.20 0.61 - 1.24 mg/dL   Calcium 8.5 (L) 8.9 - 10.3 mg/dL   Total Protein 6.6 6.5 - 8.1 g/dL   Albumin 2.1 (L) 3.5 - 5.0 g/dL   AST 24 15 - 41 U/L   ALT 20 0 - 44 U/L   Alkaline Phosphatase 244 (H) 38 - 126 U/L   Total Bilirubin 0.9 0.3 - 1.2 mg/dL   GFR, Estimated >60 >60 mL/min    Comment: (NOTE) Calculated using the CKD-EPI Creatinine Equation (2021)    Anion gap 10 5 - 15    Comment: Performed at [facility], [place] [address], [place], [place] [address]  Magnesium     Status: None   Collection Time: 12/20/20  6:15 PM  Result Value Ref Range   Magnesium 2.1 1.7 - 2.4 mg/dL    Comment: Performed at [facility], [place] [address], [place], [place] [address]  Phosphorus     Status: Abnormal   Collection Time: 12/20/20  6:15 PM  Result Value Ref Range   Phosphorus 6.9 (H) 2.5 - 4.6 mg/dL    Comment: Performed at [facility], [place] [address] [place], [place] [address]  CBC WITH DIFFERENTIAL     Status: Abnormal   Collection Time: 12/20/20  6:15 PM  Result Value Ref Range   WBC 27.1 (H) 4.0 - 10.5 K/uL   RBC 3.80 (L) 4.22 - 5.81 MIL/uL   Hemoglobin 10.7 (L) 13.0 - 17.0 g/dL   HCT 34.2 (L) 39.0 - 52.0 %   MCV 90.0 80.0 - 100.0 fL   MCH 28.2 26.0 - 34.0 pg   MCHC 31.3 30.0 - 36.0 g/dL   RDW 15.6 (H) 11.5 - 15.5 %   Platelets 504 (H) 150 - 400 K/uL   nRBC 0.0 0.0 - 0.2 %   Neutrophils Relative % 90 %   Band Neutrophils 8 %   Lymphocytes Relative 2 %   Monocytes Relative 0 %   Eosinophils Relative 0 %   Basophils Relative 0 %   WBC Morphology NORMAL    RBC Morphology NORMAL    Smear Review DONE    nRBC NONE SEEN 0 /100 WBC   Metamyelocytes Relative NONE SEEN %   Myelocytes  NONE SEEN %   Promyelocytes Relative NONE SEEN %   Blasts NONE SEEN %    Comment: Performed at [facility], [place] [address] [place], [place] [address]  Protime-INR     Status: Abnormal   Collection Time: 12/20/20  6:15 PM  Result Value Ref Range   Prothrombin Time 15.6 (H) 11.4 - 15.2 seconds   INR 1.2 0.8 - 1.2    Comment: (NOTE) INR goal varies based on device and disease states. Performed at [facility], [place] [address], [place], [place] [address]   Hemoglobin and hematocrit, blood     Status: Abnormal   Collection Time: 12/21/20 12:01 AM  Result Value Ref Range   Hemoglobin 10.5 (L) 13.0 - 17.0 g/dL   HCT 33.5 (L) 39.0 - 52.0 %    Comment: Performed at [facility], [place] [address], [place], [place] [address]  Comprehensive metabolic panel     Status: Abnormal   Collection Time: 12/21/20  6:37 AM  Result Value Ref Range   Sodium 138 135 - 145 mmol/L   Potassium 4.4 3.5 - 5.1 mmol/L   Chloride 96 (L) 98 - 111 mmol/L   CO2 31 22 - 32 mmol/L   Glucose, Bld 120 (H) 70 - 99 mg/dL    Comment: Glucose reference range applies only to samples taken after fasting for at least 8 hours.   BUN 12 6 - 20 mg/dL   Creatinine, Ser 0.96 0.61 - 1.24 mg/dL   Calcium 8.6 (L) 8.9 - 10.3 mg/dL   Total Protein 6.5 6.5 - 8.1 g/dL   Albumin 1.9 (L) 3.5 - 5.0 g/dL   AST 18 15 - 41 U/L   ALT 17 0 - 44 U/L   Alkaline Phosphatase 221 (H) 38 - 126 U/L   Total Bilirubin 0.4 0.3 - 1.2 mg/dL   GFR, Estimated >60 >60 mL/min    Comment: (NOTE) Calculated using the CKD-EPI Creatinine Equation (2021)    Anion gap 11 5 - 15    Comment: Performed at [facility], [place] [address] [place], [place] [address]  CBC     Status: Abnormal   Collection Time: 12/21/20  6:37 AM  Result Value Ref Range   WBC 16.0 (H) 4.0 - 10.5 K/uL   RBC 3.86 (L) 4.22 - 5.81 MIL/uL   Hemoglobin 10.9 (L) 13.0 - 17.0 g/dL   HCT 34.7 (L) 39.0 - 52.0 %   MCV  89.9 80.0 - 100.0 fL   MCH 28.2 26.0 - 34.0 pg   MCHC 31.4 30.0 - 36.0 g/dL   RDW 15.3 11.5 - 15.5 %   Platelets 582 (H) 150 - 400 K/uL   nRBC 0.0 0.0 - 0.2 %    Comment: Performed at [facility], [place] [address], [place], [place] [address]  Glucose, capillary     Status: Abnormal   Collection Time: 12/21/20  7:32 AM  Result Value Ref Range   Glucose-Capillary 119 (H) 70 - 99 mg/dL    Comment: Glucose reference range applies only to samples taken after fasting for at least 8 hours.    CT ABDOMEN PELVIS W CONTRAST  Result Date: 12/20/2020 CLINICAL DATA:  Bile leak status post cholecystectomy EXAM: CT ABDOMEN AND PELVIS WITH CONTRAST TECHNIQUE: Multidetector CT imaging of the abdomen and pelvis was performed using the standard protocol following bolus administration of intravenous contrast. CONTRAST:  126mL OMNIPAQUE IOHEXOL 300 MG/ML  SOLN COMPARISON:  12/04/2020, 12/20/2020 FINDINGS: Lower chest: No acute pleural or parenchymal lung disease. Minimal hypoventilatory changes at the lung bases. Hepatobiliary: Pneumobilia identified consistent with recent instrumentation. The biliary stents seen previously have been removed in the interim. There is continued dilation of the upstream common bile duct measuring up to 19 mm in diameter. There is extrinsic compression of the downstream common bile duct, similar to prior study. The gallbladder surgically absent. Stable surgical drain within the gallbladder fossa. Decreased fluid within the porta hepatis and right upper quadrant. Pancreas: The  diffuse pancreatic inflammatory changes seen on prior study are again identified, slightly decreased in prominence. A maturing fluid collection within the ventral aspect of the left upper quadrant measures 5.6 x 6.0 cm image 30/2, previously having measured 7.8 x 4.0 cm, compatible with pseudocyst. The fluid collection in the region the pancreatic head seen previously has near completely resolved, with  small residual ill-defined area remaining measuring up to 1.1 cm, previously having measured up to 3.2 cm. There is a new rounded area of phlegmon within the left mid abdomen image 45/2 measuring 5.7 x 5.0 cm. No frank abscess or pseudocyst formation at this time. 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 Spleen: Large wedge-shaped hypodensities within the spleen consistent with splenic infarcts. Adrenals/Urinary Tract: Kidneys enhance normally and symmetrically. No urinary tract calculi or obstruction. The adrenals and bladder are unremarkable. Stomach/Bowel: No bowel obstruction or ileus. Scattered colonic diverticulosis without diverticulitis. Wall thickening of the duodenum, mid transverse colon, and splenic flexure of the colon again noted, consistent with secondary inflammatory involvement due to pancreatitis. Vascular/Lymphatic: The aorta and its branches are unremarkable. There is extrinsic mass effect upon the [facility] splenic confluence due to the inflammatory changes from pancreatitis. The SMV and portal vein are patent. The splenic vein is difficult to visualize due to the inflammatory changes and timing of contrast bolus. Numerous subcentimeter lymph nodes are seen within the retroperitoneum and central mesentery, likely reactive. Reproductive: Prostate is unremarkable. Other: Small amount of free fluid throughout the upper mesentery again noted, not significantly changed since prior study. No free intraperitoneal gas. No abdominal wall hernia. Musculoskeletal: No acute or destructive bony lesions. Reconstructed images demonstrate no additional findings. IMPRESSION: 1. 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 The central fluid collection within the pancreatic head has decreased in size, consistent with decompression through the surgical fistula between the  stomach and pancreas. Likely maturing pseudocyst within the ventral aspect left upper quadrant abdomen. Developing phlegmon adjacent to the pancreatic tail within the left mid abdomen as above. 2. Decreasing fluid within the right upper quadrant and porta hepatis. No fluid collection to suggest biloma. 3. Persistent upstream common bile duct dilation, with extrinsic compression of the downstream common bile duct due to the inflammatory changes from pancreatitis. Pneumobilia consistent with recent biliary duct instrumentation. 4. Interval development of wedge-shaped splenic hypodensities consistent with splenic infarcts. Splenic artery appears patent. The splenic vein cannot be well visualized due to inflammatory changes and timing of contrast bolus, and splenic vein thrombosis is not excluded. 5. Extrinsic compression upon the confluence of the SMV and portal vein, though these vessels appear patent. 6. Secondary mural thickening of the duodenum, mid transverse colon, and splenic flexure of the colon due to the inflammatory changes from pancreatitis. Electronically Signed   By: Randa Ngo M.D.   On: 12/20/2020 22:47   DG CHEST PORT 1 VIEW  Result Date: 12/20/2020 CLINICAL DATA:  Shortness of breath. EXAM: PORTABLE CHEST 1 VIEW COMPARISON:  11/04/2020.  CT 11/12/2020 FINDINGS: Stable heart size and mediastinal contours. Bibasilar atelectasis. Possible small left pleural effusion. No pneumothorax. Chronic bronchial thickening. No acute osseous abnormalities are seen. IMPRESSION: Bibasilar atelectasis and possible small left pleural effusion. Electronically Signed   By: Keith Rake M.D.   On: 12/20/2020 18:21   DG ERCP  Result Date: 12/20/2020 CLINICAL DATA:  50 year old male with post cholecystectomy bile leak. EXAM: ERCP TECHNIQUE: Multiple spot  images obtained with the fluoroscopic device and submitted for interpretation post-procedure. FLUOROSCOPY TIME:  See procedural note for further detail  COMPARISON:  None. FINDINGS: Intraoperative seen a images are submitted for review. The images demonstrate a flexible duodenal scope in the descending duodenum with wire cannulation of the intrahepatic ducts and cholangiogram including pull-back balloon occluded cholangiogram. There is moderate intra and extrahepatic biliary ductal dilatation. However, no evidence of bile leak. Cholecystectomy clips are visualized at the site of the cystic duct stump. A JP drain is also present in the right upper quadrant. IMPRESSION: ERCP as above.  No visualized bile leak. These images were submitted for radiologic interpretation only. Please see the procedural report for the amount of contrast and the fluoroscopy time utilized. Electronically Signed   By: Jacqulynn Cadet M.D.   On: 12/20/2020 16:10    Review of Systems Blood pressure (!) 141/93, pulse 93, temperature 98 F (36.7 C), temperature source Oral, resp. rate 20, height 5\' 10"  (1.778 m), weight 101.8 kg, SpO2 92 %. Physical Exam Constitutional:      Appearance: Normal appearance.  HENT:     Head: Normocephalic.  Eyes:     General: No scleral icterus. Cardiovascular:     Rate and Rhythm: Normal rate and regular rhythm.  Pulmonary:     Effort: Pulmonary effort is normal.  Abdominal:     General: Abdomen is flat.     Comments: JP drain right upper quadrant has about 10 cc and looks relatively clear.  No significant heavy bile drainage today.  Soft nontender.  He does have fullness in his epigastrium and his port sites are intact and clean.  No peritonitis.  Musculoskeletal:        General: Normal range of motion.  Skin:    General: Skin is warm and dry.  Neurological:     Mental Status: He is alert.    Assessment/Plan: Status post laparoscopic cholecystectomy with persistent bile leak and pancreatitis post ERCP  Dr. Zenia Resides has reviewed the CT scans and discussed the case with Dr. [place] Pulling and myself this morning.  No role for surgery at this  point in time.  Due to the inflammatory changes any sort of intervention would more than likely have a high risk of failure and severe high-level complications.  Continue the JP drain for now.  He may benefit from some outpatient work-up to include an upper GI at some point to exclude any sort of small duodenal fistula from his pancreatitis as  being the culprit.  HIDA studies would be of low yield at this point since he has been extensively studied endoscopically.  He has no signs of bleeding with his hemoglobin stable this morning.  From a surgical standpoint he can be discharged home and follow-up with Dr. Zenia Resides as an outpatient to help manage these issues.  Steve Romero 12/21/2020, 8:44 AM

## 2020-12-21 NOTE — Evaluation (Signed)
Occupational Therapy Evaluation Patient Details Name: Steve Romero MRN: [id number] DOB: [birth year] Today's Date: 12/21/2020    History of Present Illness 50 year old male, admitted for observation after ERCP on 12/20/2020. AXIOS cystgastrostomy stent and double pigtail - removed with more oozing/bleeding than normally expected. Findings also suspicious for peripheral bile duct leak.     Monitored overnight due to bleeding during the procedure. No further bleeding noted by the patient. Hemoglobin is stable.  Medical history significant for but not limited too anxiety and depression, bipolar disorder, COPD, history of enlarged prostate, hyperlipidemia, hypertension, history of schizoaffective disorder, sleep apnea.  The patient has had a complex history including a cholecystectomy complicated by bile leak post ERCP pancreatitis with pseudocyst requiring cystogastrostomy due to bile leak and was transferred to [facility] about a month ago for pancreatic necrosis as well as a cystogastrostomy stent AXIOS stent placement.   Clinical Impression   Patient evaluated by Occupational Therapy with no further acute OT needs identified. All education has been completed and the patient has no further questions. Pt is baseline/independent to Mod I with ADLs and functional bathroom mobility.  See below for any follow-up Occupational Therapy or equipment needs. OT is signing off. Thank you for this referral.     Follow Up Recommendations  No OT follow up    Equipment Recommendations  None recommended by OT    Recommendations for Other Services       Precautions / Restrictions Precautions Precautions: Fall Restrictions Weight Bearing Restrictions: No      Mobility Bed Mobility Overal bed mobility: Modified Independent             General bed mobility comments: HOB elevated.    Transfers Overall transfer level: Independent Equipment used: None                  Balance Overall  balance assessment: Independent;Modified Independent                                         ADL either performed or assessed with clinical judgement   ADL Overall ADL's : At baseline                                       General ADL Comments: Pt educated on use of figure 4 technique for LE dressing in order to avoid forward waist bend. Pt able to demonstrate back with both RT and LT legs Mod I. Pt performed in-room ambultion without AD, simulated Indepednent toileting and performed Independent standard toilet t/f without use of rail.     Vision   Vision Assessment?: No apparent visual deficits     Perception     Praxis      Pertinent Vitals/Pain Pain Assessment: 0-10 Pain Score: 3  Pain Location: ABD Pain Descriptors / Indicators: Discomfort Pain Intervention(s): Limited activity within patient's tolerance;Monitored during session;Repositioned     Hand Dominance Right   Extremity/Trunk Assessment Upper Extremity Assessment Upper Extremity Assessment: Overall WFL for tasks assessed (Light resistance proximally to manage ABD pain.)   Lower Extremity Assessment Lower Extremity Assessment: Defer to PT evaluation   Cervical / Trunk Assessment Cervical / Trunk Assessment: Normal   Communication Communication Communication: No difficulties   Cognition Arousal/Alertness: Awake/alert Behavior During Therapy: WFL for tasks assessed/performed Overall Cognitive  Status: Within Functional Limits for tasks assessed                                     General Comments       Exercises     Shoulder Instructions      Home Living Family/patient expects to be discharged to:: Private residence Living Arrangements: Parent Available Help at Discharge: Family Type of Home: House Home Access: Stairs to enter Secretary/administrator of Steps: 2-3 Entrance Stairs-Rails: Left Home Layout: One level     Bathroom Shower/Tub:  Chief Strategy Officer: Standard     Home Equipment: Environmental consultant - 2 wheels          Prior Functioning/Environment Level of Independence: Independent        Comments: Parents assist with heavier housework. Pt can perform light IADLs and all BADLs Independently. Usualyl ambulates unlimited distances without AD. Pt denies any falls in past year.        OT Problem List: Decreased activity tolerance;Pain      OT Treatment/Interventions:      OT Goals(Current goals can be found in the care plan section) Acute Rehab OT Goals Patient Stated Goal: Go home OT Goal Formulation: With patient/family Time For Goal Achievement: 01/04/21 Potential to Achieve Goals: Good  OT Frequency:     Barriers to D/C:            Co-evaluation              AM-PAC OT "6 Clicks" Daily Activity     Outcome Measure Help from another person eating meals?: None Help from another person taking care of personal grooming?: None Help from another person toileting, which includes using toliet, bedpan, or urinal?: None Help from another person bathing (including washing, rinsing, drying)?: None Help from another person to put on and taking off regular upper body clothing?: None Help from another person to put on and taking off regular lower body clothing?: None 6 Click Score: 24   End of Session Equipment Utilized During Treatment: Gait belt Nurse Communication: Mobility status  Activity Tolerance: Patient tolerated treatment well Patient left: in chair;with family/visitor present;with call bell/phone within reach  OT Visit Diagnosis: Pain Pain - part of body:  (ABD)                Time: 1501-1520 OT Time Calculation (min): 19 min Charges:  OT General Charges $OT Visit: 1 Visit OT Evaluation $OT Eval Low Complexity: 1 Low  Rual Vermeer, OT Acute Rehab Services Office: [phone] 12/21/2020  Theodoro Clock 12/21/2020, 3:53 PM

## 2020-12-21 NOTE — Evaluation (Signed)
Physical Therapy Evaluation Patient Details Name: Steve Romero MRN: [id number] DOB: December 20, 1970 Today's Date: 12/21/2020   History of Present Illness  50 year old male, admitted for observation after ERCP on 12/20/2020. AXIOS cystgastrostomy stent and double pigtail - removed with more oozing/bleeding than normally expected. Findings also suspicious for peripheral bile duct leak.     Monitored overnight due to bleeding during the procedure. No further bleeding noted by the patient. Hemoglobin is stable.  Medical history significant for but not limited too anxiety and depression, bipolar disorder, COPD, history of enlarged prostate, hyperlipidemia, hypertension, history of schizoaffective disorder, sleep apnea.  The patient has had a complex history including a cholecystectomy complicated by bile leak post ERCP pancreatitis with pseudocyst requiring cystogastrostomy due to bile leak and was transferred to [facility] about a month ago for pancreatic necrosis as well as a cystogastrostomy stent AXIOS stent placement.  Clinical Impression  Pt admitted as above and currently demonstrating safe performance of basic mobility tasks as well as good balance and safety awareness with ambulation.  Pt currently has no PT needs and will dc from PT service at this time.  Pt feels hs is close to his baseline and very eager for dc home ASAP.    Follow Up Recommendations No PT follow up    Equipment Recommendations  None recommended by PT    Recommendations for Other Services       Precautions / Restrictions Precautions Precautions: Fall Restrictions Weight Bearing Restrictions: No      Mobility  Bed Mobility Overal bed mobility: Modified Independent             General bed mobility comments: Pt up in chair with OT and requests back to same    Transfers Overall transfer level: Independent Equipment used: None             General transfer comment: use of arms to assist up and down but no  outside assist needed  Ambulation/Gait Ambulation/Gait assistance: Supervision;Independent Gait Distance (Feet): 450 Feet Assistive device: None Gait Pattern/deviations: Step-through pattern;Decreased step length - right;Decreased step length - left;Shuffle;Wide base of support Gait velocity: mod pace   General Gait Details: mild post lean to offset distended abdomen, slightly widened BOS, one slight stumble with stepping backward but self corrected  Stairs            Wheelchair Mobility    Modified Rankin (Stroke Patients Only)       Balance Overall balance assessment: Needs assistance Sitting-balance support: No upper extremity supported;Feet supported Sitting balance-Leahy Scale: Normal     Standing balance support: No upper extremity supported Standing balance-Leahy Scale: Good                               Pertinent Vitals/Pain Pain Assessment: 0-10 Pain Score: 3  Pain Location: ABD Pain Descriptors / Indicators: Discomfort Pain Intervention(s): Limited activity within patient's tolerance;Monitored during session    Home Living Family/patient expects to be discharged to:: Private residence Living Arrangements: Parent Available Help at Discharge: Family Type of Home: House Home Access: Stairs to enter Entrance Stairs-Rails: Left Entrance Stairs-Number of Steps: 2-3 Home Layout: One level Home Equipment: Walker - 2 wheels      Prior Function Level of Independence: Independent         Comments: Parents assist with heavier housework. Pt can perform light IADLs and all BADLs Independently. Usualyl ambulates unlimited distances without AD. Pt denies any  falls in past year.     Hand Dominance   Dominant Hand: Right    Extremity/Trunk Assessment   Upper Extremity Assessment Upper Extremity Assessment: Overall WFL for tasks assessed    Lower Extremity Assessment Lower Extremity Assessment: Overall WFL for tasks assessed    Cervical  / Trunk Assessment Cervical / Trunk Assessment: Normal  Communication   Communication: No difficulties  Cognition Arousal/Alertness: Awake/alert Behavior During Therapy: WFL for tasks assessed/performed;Flat affect Overall Cognitive Status: Within Functional Limits for tasks assessed                                        General Comments      Exercises     Assessment/Plan    PT Assessment Patent does not need any further PT services  PT Problem List         PT Treatment Interventions Gait training;Functional mobility training    PT Goals (Current goals can be found in the Care Plan section)  Acute Rehab PT Goals Patient Stated Goal: Go home PT Goal Formulation: All assessment and education complete, DC therapy    Frequency Min 1X/week   Barriers to discharge        Co-evaluation               AM-PAC PT "6 Clicks" Mobility  Outcome Measure Help needed turning from your back to your side while in a flat bed without using bedrails?: None Help needed moving from lying on your back to sitting on the side of a flat bed without using bedrails?: None Help needed moving to and from a bed to a chair (including a wheelchair)?: None Help needed standing up from a chair using your arms (e.g., wheelchair or bedside chair)?: None Help needed to walk in hospital room?: None Help needed climbing 3-5 steps with a railing? : A Little 6 Click Score: 23    End of Session Equipment Utilized During Treatment: Gait belt Activity Tolerance: Patient tolerated treatment well Patient left: in chair;with call bell/phone within reach;with family/visitor present Nurse Communication: Mobility status      Time: 4540-9811 PT Time Calculation (min) (ACUTE ONLY): 12 min   Charges:   PT Evaluation $PT Eval Low Complexity: 1 Low          Mauro Kaufmann PT Acute Rehabilitation Services Pager [phone] Office [phone]   Steve Romero 12/21/2020, 4:15  PM

## 2020-12-21 NOTE — Progress Notes (Addendum)
[facility] Progress Note  CC:  I feel better, can I go home?  Assessment / Plan: Persistent bile leak. Severe post-ERCP pancreatitis developed after stent placement for post cholecystectomy bile leak April 2022. 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 Admitted for observation after ERCP yesterday. AXIOS cystgastrostomy stent and double pigtail - removed with more oozing/bleeding than normally expected. Findings also suspicious for peripheral bile duct leak.   Monitored overnight due to bleeding during the procedure. No further bleeding noted by the patient. Hemoglobin is stable. JP drain output is low, although not fully documented by nursing staff. Surgical consultation reviewed and they recommend outpatient follow-up with Dr. Zenia Resides.   Leukocytosis has improved to 16 from 27.1 yesterday.  No decline in hemoglobin despite intraprocedure bleeding.  BUN stable and normal.   No further endoscopy planned at this time. Will review with Dr. Rush Landmark to see if he has any further recommendations.    Subjective: Feeling well today. Wants to go home. JP drain output has not been documented but the patient reports it was emptied twice overnight with minimal output. No GI symptoms today. No family was present at the time of my evaluation.   Objective:  Vital signs in last 24 hours: Temp:  [97.5 F (36.4 C)-98.7 F (37.1 C)] 98 F (36.7 C) (06/18 0538) Pulse Rate:  [78-102] 93 (06/18 0552) Resp:  [11-21] 20 (06/18 0538) BP: (133-177)/(66-106) 141/93 (06/18 0552) SpO2:  [88 %-96 %] 92 % (06/18 0552) Weight:  [101.8 kg-108.9 kg] 101.8 kg (06/18 0500)   General:   Alert, in NAD Abdomen:  Soft. Nontender. Nondistended. Normal bowel sounds. No rebound or guarding. Abdominal dressings are dry. JP drain output is minimal and clear.  LAD: No inguinal or umbilical LAD Extremities:  Without edema. Neurologic:  Alert and  oriented x4;  grossly  normal neurologically. Psych:  Alert and cooperative. Normal mood and affect.   Lab Results: Recent Labs    12/20/20 1815 12/21/20 0001 12/21/20 0637  WBC 27.1*  --  16.0*  HGB 10.7* 10.5* 10.9*  HCT 34.2* 33.5* 34.7*  PLT 504*  --  582*   BMET Recent Labs    12/20/20 1815 12/21/20 0637  NA 135 138  K 4.1 4.4  CL 95* 96*  CO2 30 31  GLUCOSE 141* 120*  BUN 11 12  CREATININE 1.20 0.96  CALCIUM 8.5* 8.6*   LFT Recent Labs    12/21/20 0637  PROT 6.5  ALBUMIN 1.9*  AST 18  ALT 17  ALKPHOS 221*  BILITOT 0.4   PT/INR Recent Labs    12/20/20 1815  LABPROT 15.6*  INR 1.2   Hepatitis Panel No results for input(s): HEPBSAG, HCVAB, HEPAIGM, HEPBIGM in the last 72 hours.  CT ABDOMEN PELVIS W CONTRAST  Result Date: 12/20/2020 CLINICAL DATA:  Bile leak status post cholecystectomy EXAM: CT ABDOMEN AND PELVIS WITH CONTRAST TECHNIQUE: Multidetector CT imaging of the abdomen and pelvis was performed using the standard protocol following bolus administration of intravenous contrast. CONTRAST:  114mL OMNIPAQUE IOHEXOL 300 MG/ML  SOLN COMPARISON:  12/04/2020, 12/20/2020 FINDINGS: Lower chest: No acute pleural or parenchymal lung disease. Minimal hypoventilatory changes at the lung bases. Hepatobiliary: Pneumobilia identified consistent with recent instrumentation. The biliary stents seen previously have been removed in the interim. There is continued dilation of the upstream common bile duct measuring up to 19 mm in diameter. There is extrinsic compression of the downstream common bile duct, similar to prior  study. The gallbladder surgically absent. Stable surgical drain within the gallbladder fossa. Decreased fluid within the porta hepatis and right upper quadrant. Pancreas: The diffuse pancreatic inflammatory changes seen on prior study are again identified, slightly decreased in prominence. A maturing fluid collection within the ventral aspect of the left upper quadrant measures 5.6  x 6.0 cm image 30/2, previously having measured 7.8 x 4.0 cm, compatible with pseudocyst. The fluid collection in the region the pancreatic head seen previously has near completely resolved, with small residual ill-defined area remaining measuring up to 1.1 cm, previously having measured up to 3.2 cm. There is a new rounded area of phlegmon within the left mid abdomen image 45/2 measuring 5.7 x 5.0 cm. No frank abscess or pseudocyst formation at this time. 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 Spleen: Large wedge-shaped hypodensities within the spleen consistent with splenic infarcts. Adrenals/Urinary Tract: Kidneys enhance normally and symmetrically. No urinary tract calculi or obstruction. The adrenals and bladder are unremarkable. Stomach/Bowel: No bowel obstruction or ileus. Scattered colonic diverticulosis without diverticulitis. Wall thickening of the duodenum, mid transverse colon, and splenic flexure of the colon again noted, consistent with secondary inflammatory involvement due to pancreatitis. Vascular/Lymphatic: The aorta and its branches are unremarkable. There is extrinsic mass effect upon the [facility] splenic confluence due to the inflammatory changes from pancreatitis. The SMV and portal vein are patent. The splenic vein is difficult to visualize due to the inflammatory changes and timing of contrast bolus. Numerous subcentimeter lymph nodes are seen within the retroperitoneum and central mesentery, likely reactive. Reproductive: Prostate is unremarkable. Other: Small amount of free fluid throughout the upper mesentery again noted, not significantly changed since prior study. No free intraperitoneal gas. No abdominal wall hernia. Musculoskeletal: No acute or destructive bony lesions. Reconstructed images demonstrate no additional findings. IMPRESSION: 1. Continued inflammatory changes  compatible with necrotizing pancreatitis. The central fluid collection within the pancreatic head has decreased in size, consistent with decompression through the surgical fistula between the stomach and pancreas. Likely maturing pseudocyst within the ventral aspect left upper quadrant abdomen. Developing phlegmon adjacent to the pancreatic tail within the left mid abdomen as above. 2. Decreasing fluid within the right upper quadrant and porta hepatis. No fluid collection to suggest biloma. 3. Persistent upstream common bile duct dilation, with extrinsic compression of the downstream common bile duct due to the inflammatory changes from pancreatitis. Pneumobilia consistent with recent biliary duct instrumentation. 4. Interval development of wedge-shaped splenic hypodensities consistent with splenic infarcts. Splenic artery appears patent. The splenic vein cannot be well visualized due to inflammatory changes and timing of contrast bolus, and splenic vein thrombosis is not excluded. 5. Extrinsic compression upon the confluence of the SMV and portal vein, though these vessels appear patent. 6. Secondary mural thickening of the duodenum, mid transverse colon, and splenic flexure of the colon due to the inflammatory changes from pancreatitis. Electronically Signed   By: Randa Ngo M.D.   On: 12/20/2020 22:47   DG CHEST PORT 1 VIEW  Result Date: 12/20/2020 CLINICAL DATA:  Shortness of breath. EXAM: PORTABLE CHEST 1 VIEW COMPARISON:  11/04/2020.  CT 11/12/2020 FINDINGS: Stable heart size and mediastinal contours. Bibasilar atelectasis. Possible small left pleural effusion. No pneumothorax. Chronic bronchial thickening. No acute osseous abnormalities are seen. IMPRESSION: Bibasilar atelectasis and possible small left pleural effusion. Electronically Signed   By: Keith Rake M.D.   On: 12/20/2020  18:21   DG ERCP  Result Date: 12/20/2020 CLINICAL DATA:  50 year old male with post cholecystectomy bile leak.  EXAM: ERCP TECHNIQUE: Multiple spot images obtained with the fluoroscopic device and submitted for interpretation post-procedure. FLUOROSCOPY TIME:  See procedural note for further detail COMPARISON:  None. FINDINGS: Intraoperative seen a images are submitted for review. The images demonstrate a flexible duodenal scope in the descending duodenum with wire cannulation of the intrahepatic ducts and cholangiogram including pull-back balloon occluded cholangiogram. There is moderate intra and extrahepatic biliary ductal dilatation. However, no evidence of bile leak. Cholecystectomy clips are visualized at the site of the cystic duct stump. A JP drain is also present in the right upper quadrant. IMPRESSION: ERCP as above.  No visualized bile leak. These images were submitted for radiologic interpretation only. Please see the procedural report for the amount of contrast and the fluoroscopy time utilized. Electronically Signed   By: Jacqulynn Cadet M.D.   On: 12/20/2020 16:10        LOS: 0 days   [place]  12/21/2020, 10:42 AM

## 2020-12-21 NOTE — Discharge Summary (Signed)
Physician Discharge Summary  Steve Romero DQQ:[id number] DOB: 1971/01/07 DOA: 12/20/2020  PCP: [name], DO  Admit date: 12/20/2020 Discharge date: 12/21/2020  Admitted From: Home Disposition: Home  Recommendations for Outpatient Follow-up:  Follow up with PCP in 1-2 weeks Follow up with Gastroenterology within 1-2 weeks Follow up with General Surgery within 1 week  Please obtain CMP/CBC, Mag, Phos in one week Please follow up on the following pending results:  Home Health: No Equipment/Devices: None  Discharge Condition: Stable CODE STATUS: FULL CODE Diet recommendation: Heart Healthy Diet   Brief/Interim Summary:  HPI: Steve Romero is a 50 y.o. male with medical history significant for but not limited too anxiety and depression, bipolar disorder, COPD, history of enlarged prostate, hyperlipidemia, hypertension, history of schizoaffective disorder, sleep apnea as well as other comorbidities who presented as a direct admission from the endoscopy suite at the request of Dr Justice Britain.  The patient has had a complex history including a cholecystectomy complicated by bile leak post ERCP pancreatitis with pseudocyst requiring cystogastrostomy due to bile leak and was transferred to [facility] about a month ago for pancreatic necrosis as well as a cystogastrostomy stent AXIOS stent placement.  He has been seen by Dr. Rush Landmark several times and underwent stenting for his bile leak.  He be antibiotics with IV meropenem and had his PICC line removed last Friday and started on amoxicillin/Lovenox acid for additional 14 days through his repeat ERCP.  He presented today to [facility] for his AXIOS stent removal via ERCP and he underwent an EGD/EUS.  After his ERCP and stent removal he had some postoperative bleeding which was a little bit more than what the GI physician would like so GI requested observation to monitor his hemoglobin and blood counts.  He denies any complaints  currently and denies any nausea or vomiting, lightheadedness or dizziness and has no abdominal pain.  The GI physician is concerned that his bile leak is not related to his stenting and feels that his peripheral and requests general surgery consultation given that Dr. Lequita Asal feels that he has done all that he can be endoscopic route.  Was asked admit this patient for observation to monitor his blood counts and get a surgical opinion upon his peripheral bile leak.  Currently has a JP drain which is collecting the fluid that the patient empties several times a day.  Surgery evaluated and felt no role for surgical intervention at this time and they recommended outpatient follow-up.  His bile output was not as much.  Prior to discharge his WBC was elevated and trended down slightly trended back up.  GI evaluated and felt that the patient is stable for discharge and recommended p.o. antibiotics with Augmentin for a week followed by ciprofloxacin and Flagyl for another week after.  Patient had no complaints or pain in was deemed stable for discharge only to follow-up with PCP, general surgery as well as gastroenterology in outpatient setting.  Discharge Diagnoses:  Active Problems:   Hemorrhage following endoscopic retrograde cholangiopancreatography (ERCP)  Post ERCP bleeding -Place in observation telemetry -The GI physician is recommending clear liquid diet for now and will be obtaining a CT scan of the abdomen pelvis -Check hemoglobin and hematocrit carefully and will obtain labs currently has no labs have been done; will trend hemoglobin/hematocrit every 6h. Hgb/Hct very Stable  -Start IV fluid hydration with lactated Ringer's at 75 MLS per hour and now stop -Hold off pharmacological prophylaxis -Check COVID screen as it has  not been done -GI  consulted and appreciate further evaluation and recommendations and recommended discharged    History of post ERCP pancreatitis with pancreatic necrosis and  persistent bile leak status post recent cholecystectomy and necrosectomy via cystogastrostomy and JP drain placement -Has received IV meropenem and has been transitioned to p.o. Augmentin and will continue p.o. Augmentin -We will start IV fluid hydration -Check a lipase level -Continue to have significant drainage from the JP indicative of persistent bile leak despite having common bile duct stents in place; his AXIOS stent was removed today by GI  -GI feels they have done all the care for his persistent bile leak endoscopically and requesting a surgical evaluation to see if anything can be done here -Had a Leukocytosis 27.1 -> 16.0 -> 19.4 -Continue with pain control and supportive care  Leukocytosis -Likely reactive in the setting of above but will put on empiric antibiotics per GI recs -No S/Sx of Infection -Follow up within 1 week and will need further workup if still elevated at outpatient appointment    Acute Respiratory Failure with Hypoxia, improved  -Post-ERCP -Will check CXR and as below -SpO2: 92 % O2 Flow Rate (L/min): 3 L/min -Check COVID and add Nebs -Flutter and Incentive -Continuous Pulse Oximetry and Maintain O2 Sats >90% -Continue Supplemental O2 via [place] and Wean O2 as Tolerated and did well without it    Hypertension -Does not appear to be on any antihypertensives currently -Add PRN Hydralazine -Continue to Monitor BP per Protocol -Last BP was 157/84   COPD -Check Admission CXR and showed "Bibasilar atelectasis and possible small left pleural effusion." -IF necessary will place on Supplemnetal O2 -Add PRN Nebs    Depression/anxiety/bipolar disorder -No longer taking risperidone 1 mg nightly   Obesity -Complicates overall prognosis and care -Estimated body mass index is 32.2 kg/m as calculated from the following:   Height as of this encounter: 5\' 10"  (1.778 m).   Weight as of this encounter: 101.8 kg. -Weight Loss and Dietary counseling given    Severe  Protein Calorie Malnutrition -Check Pre-Albumin as an outpatient; Albumin was 1.9 -Continue Supplements -Consult Nutrition as an outpatient   Discharge Instructions  Discharge Instructions     Call MD for:  difficulty breathing, headache or visual disturbances   Complete by: As directed    Call MD for:  extreme fatigue   Complete by: As directed    Call MD for:  hives   Complete by: As directed    Call MD for:  persistant dizziness or light-headedness   Complete by: As directed    Call MD for:  persistant nausea and vomiting   Complete by: As directed    Call MD for:  redness, tenderness, or signs of infection (pain, swelling, redness, odor or green/yellow discharge around incision site)   Complete by: As directed    Call MD for:  severe uncontrolled pain   Complete by: As directed    Call MD for:  temperature >100.4   Complete by: As directed    Diet - low sodium heart healthy   Complete by: As directed    Discharge instructions   Complete by: As directed    You were cared for by a hospitalist during your hospital stay. If you have any questions about your discharge medications or the care you received while you were in the hospital after you are discharged, you can call the unit and ask to speak with the hospitalist on call if the hospitalist  that took care of you is not available. Once you are discharged, your primary care physician will handle any further medical issues. Please note that NO REFILLS for any discharge medications will be authorized once you are discharged, as it is imperative that you return to your primary care physician (or establish a relationship with a primary care physician if you do not have one) for your aftercare needs so that they can reassess your need for medications and monitor your lab values.  Follow up with PCP, Gastroenterology, and General Surgery. Take all medications as prescribed. If symptoms change or worsen please return to the ED for  evaluation   Increase activity slowly   Complete by: As directed       Allergies as of 12/21/2020       Reactions   Desipramine Nausea Only, Rash        Medication List     STOP taking these medications    cyclobenzaprine 10 MG tablet Commonly known as: FLEXERIL   docusate sodium 100 MG capsule Commonly known as: Colace   ondansetron 4 MG disintegrating tablet Commonly known as: Zofran ODT   polyethylene glycol 17 g packet Commonly known as: MIRALAX / GLYCOLAX   risperiDONE 1 MG tablet Commonly known as: RISPERDAL   traZODone 150 MG tablet Commonly known as: DESYREL       TAKE these medications    acetaminophen 325 MG tablet Commonly known as: TYLENOL Take 2 tablets (650 mg total) by mouth every 6 (six) hours as needed for mild pain (or Fever >/= 101).   amoxicillin-clavulanate 875-125 MG tablet Commonly known as: Augmentin Take 1 tablet by mouth 2 (two) times daily for 7 days. Start taking on June 11th   ciprofloxacin 500 MG tablet Commonly known as: Cipro Take 1 tablet (500 mg total) by mouth 2 (two) times daily for 7 days. Start taking on: December 29, 2020   feeding supplement Liqd Take 237 mLs by mouth 3 (three) times daily between meals. What changed: when to take this   ibuprofen 200 MG tablet Commonly known as: ADVIL Take 400 mg by mouth every 6 (six) hours as needed for fever.   metroNIDAZOLE 500 MG tablet Commonly known as: Flagyl Take 1 tablet (500 mg total) by mouth 3 (three) times daily for 7 days. Start taking on: December 29, 2020   ondansetron 4 MG tablet Commonly known as: ZOFRAN Take 1 tablet (4 mg total) by mouth every 6 (six) hours as needed for nausea.   oxyCODONE 5 MG immediate release tablet Commonly known as: Oxy IR/ROXICODONE Take 1-2 tablets (5-10 mg total) by mouth every 4 (four) hours as needed for severe pain or breakthrough pain.       ASK your doctor about these medications    Gerhardt's butt cream Crea Apply 1  application topically daily.        Allergies  Allergen Reactions   Desipramine Nausea Only and Rash    Consultations: GI General Surgery  Procedures/Studies: CT ABDOMEN PELVIS W CONTRAST  Result Date: 12/20/2020 CLINICAL DATA:  Bile leak status post cholecystectomy EXAM: CT ABDOMEN AND PELVIS WITH CONTRAST TECHNIQUE: Multidetector CT imaging of the abdomen and pelvis was performed using the standard protocol following bolus administration of intravenous contrast. CONTRAST:  165mL OMNIPAQUE IOHEXOL 300 MG/ML  SOLN COMPARISON:  12/04/2020, 12/20/2020 FINDINGS: Lower chest: No acute pleural or parenchymal lung disease. Minimal hypoventilatory changes at the lung bases. Hepatobiliary: Pneumobilia identified consistent with recent instrumentation. The biliary stents seen  previously have been removed in the interim. There is continued dilation of the upstream common bile duct measuring up to 19 mm in diameter. There is extrinsic compression of the downstream common bile duct, similar to prior study. The gallbladder surgically absent. Stable surgical drain within the gallbladder fossa. Decreased fluid within the porta hepatis and right upper quadrant. Pancreas: The diffuse pancreatic inflammatory changes seen on prior study are again identified, slightly decreased in prominence. A maturing fluid collection within the ventral aspect of the left upper quadrant measures 5.6 x 6.0 cm image 30/2, previously having measured 7.8 x 4.0 cm, compatible with pseudocyst. The fluid collection in the region the pancreatic head seen previously has near completely resolved, with small residual ill-defined area remaining measuring up to 1.1 cm, previously having measured up to 3.2 cm. There is a new rounded area of phlegmon within the left mid abdomen image 45/2 measuring 5.7 x 5.0 cm. No frank abscess or pseudocyst formation at this time. 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 Spleen: Large wedge-shaped hypodensities within the spleen consistent with splenic infarcts. Adrenals/Urinary Tract: Kidneys enhance normally and symmetrically. No urinary tract calculi or obstruction. The adrenals and bladder are unremarkable. Stomach/Bowel: No bowel obstruction or ileus. Scattered colonic diverticulosis without diverticulitis. Wall thickening of the duodenum, mid transverse colon, and splenic flexure of the colon again noted, consistent with secondary inflammatory involvement due to pancreatitis. Vascular/Lymphatic: The aorta and its branches are unremarkable. There is extrinsic mass effect upon the [facility] splenic confluence due to the inflammatory changes from pancreatitis. The SMV and portal vein are patent. The splenic vein is difficult to visualize due to the inflammatory changes and timing of contrast bolus. Numerous subcentimeter lymph nodes are seen within the retroperitoneum and central mesentery, likely reactive. Reproductive: Prostate is unremarkable. Other: Small amount of free fluid throughout the upper mesentery again noted, not significantly changed since prior study. No free intraperitoneal gas. No abdominal wall hernia. Musculoskeletal: No acute or destructive bony lesions. Reconstructed images demonstrate no additional findings. IMPRESSION: 1. 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 The central fluid collection within the pancreatic head has decreased in size, consistent with decompression through the surgical fistula between the stomach and pancreas. Likely maturing pseudocyst within the ventral aspect left upper quadrant abdomen. Developing phlegmon adjacent to the pancreatic tail within the left mid abdomen as above. 2. Decreasing fluid within the right upper quadrant and porta hepatis. No fluid collection to suggest biloma. 3. Persistent upstream common bile duct  dilation, with extrinsic compression of the downstream common bile duct due to the inflammatory changes from pancreatitis. Pneumobilia consistent with recent biliary duct instrumentation. 4. Interval development of wedge-shaped splenic hypodensities consistent with splenic infarcts. Splenic artery appears patent. The splenic vein cannot be well visualized due to inflammatory changes and timing of contrast bolus, and splenic vein thrombosis is not excluded. 5. Extrinsic compression upon the confluence of the SMV and portal vein, though these vessels appear patent. 6. Secondary mural thickening of the duodenum, mid transverse colon, and splenic flexure of the colon due to the inflammatory changes from pancreatitis. Electronically Signed   By: Randa Ngo M.D.   On: 12/20/2020 22:47   CT ABDOMEN PELVIS W CONTRAST  Result Date: 12/04/2020 CLINICAL DATA:  Bile leak status post cholecystectomy. JP drain in place. EXAM: CT ABDOMEN AND PELVIS WITH CONTRAST TECHNIQUE: Multidetector CT imaging of the abdomen and  pelvis was performed using the standard protocol following bolus administration of intravenous contrast. CONTRAST:  38mL OMNIPAQUE IOHEXOL 300 MG/ML  SOLN COMPARISON:  11/20/2020 FINDINGS: Lower chest: Chest unremarkable. Hepatobiliary: No suspicious focal abnormality within the liver parenchyma. Pneumobilia noted with common bile duct stents visualized in situ. Stents are in similar position to the prior study. Gallbladder surgically absent. Small volume fluid noted in the gallbladder fossa with stable position of the JP drain in the gallbladder fossa. Pancreas: Diffuse pancreatic edema/inflammation noted with no evidence for main duct dilatation. Areas of pancreatic parenchymal non enhancement evident. Collection of gas and debris in the head of the pancreas is similar to minimally decreased in the interval measuring 3.0 x 3.2 cm today at the same level it was measured at 4.9 x 3.4 cm previously. This  collection tracks antero medially towards the posterior wall the stomach where a gastro cystostomy stent is again noted. There is fairly extensive soft tissue density in the transverse mesocolon, as before with interval development of small rim enhancing collections in the left retroperitoneal space, along and just inferior to the tail of pancreas. 7.8 x 4.0 cm collection of fluid along the lateralstomach (28/2) is new in the interval. Spleen: No splenomegaly. No focal mass lesion. Adrenals/Urinary Tract: No adrenal nodule or mass. Kidneys unremarkable. No evidence for hydroureter. The urinary bladder appears normal for the degree of distention. Stomach/Bowel: As above, gastro cystostomy stent device noted. Gas inferior to the stent is compatible with its presence. Diffuse wall thickening noted in the duodenum, likely secondary. No small bowel wall thickening. No small bowel dilatation. The terminal ileum is normal. The appendix is not well visualized. There is some mild wall thickening in the transverse colon, also likely secondary. Vascular/Lymphatic: No abdominal aortic aneurysm. Portal vein is not visualized in the region of the portal splenic confluence compatible with marked attenuation and possible occlusion. There does appear to be some opacification of the splenic vein. Scattered small peripancreatic lymph nodes again noted. No pelvic sidewall lymphadenopathy. Reproductive: The prostate gland and seminal vesicles are unremarkable. Other: No substantial intraperitoneal free fluid. Musculoskeletal: No worrisome lytic or sclerotic osseous abnormality. Degenerative changes noted at the L3-4 level. IMPRESSION: 1. No substantial interval change in the marked edema/inflammation involving the pancreas and peripancreatic tissues. There is extensive inflammatory change in the transverse mesocolon. Poor enhancement of pancreatic parenchyma is highly concerning for pancreatic necrosis, relatively diffuse. 2. Interval  development of small rim enhancing collections in the left retroperitoneal space, along and just inferior to the tail of pancreas likely reflecting small evolving pseudocysts. 3. Interval development of a 7.8 x 4.0 cm collection of fluid along the lateral stomach without rim enhancement. 4. Interval slight decrease in size of the gas and debris collection in the head of the pancreas. This collection communicates with the collection containing the gastro cystostomy stent. 5. Common bile duct stents in situ with pneumobilia. Stent position does not appear substantially changed in the interval. 6. Marked attenuation and possible occlusion of the main portal vein at the portal splenic confluence. There does appear to be some opacification of the splenic vein. 7. Mild wall thickening in the duodenum and transverse colon, likely secondary. 8. Small volume fluid in the gallbladder fossa with stable position of the JP-drain in the gallbladder fossa. 9. Aortic Atherosclerosis (ICD10-I70.0). Electronically Signed   By: Misty Stanley M.D.   On: 12/04/2020 15:03   DG CHEST PORT 1 VIEW  Result Date: 12/20/2020 CLINICAL DATA:  Shortness of breath.  EXAM: PORTABLE CHEST 1 VIEW COMPARISON:  11/04/2020.  CT 11/12/2020 FINDINGS: Stable heart size and mediastinal contours. Bibasilar atelectasis. Possible small left pleural effusion. No pneumothorax. Chronic bronchial thickening. No acute osseous abnormalities are seen. IMPRESSION: Bibasilar atelectasis and possible small left pleural effusion. Electronically Signed   By: Keith Rake M.D.   On: 12/20/2020 18:21   DG ERCP  Result Date: 12/20/2020 CLINICAL DATA:  50 year old male with post cholecystectomy bile leak. EXAM: ERCP TECHNIQUE: Multiple spot images obtained with the fluoroscopic device and submitted for interpretation post-procedure. FLUOROSCOPY TIME:  See procedural note for further detail COMPARISON:  None. FINDINGS: Intraoperative seen a images are submitted for  review. The images demonstrate a flexible duodenal scope in the descending duodenum with wire cannulation of the intrahepatic ducts and cholangiogram including pull-back balloon occluded cholangiogram. There is moderate intra and extrahepatic biliary ductal dilatation. However, no evidence of bile leak. Cholecystectomy clips are visualized at the site of the cystic duct stump. A JP drain is also present in the right upper quadrant. IMPRESSION: ERCP as above.  No visualized bile leak. These images were submitted for radiologic interpretation only. Please see the procedural report for the amount of contrast and the fluoroscopy time utilized. Electronically Signed   By: Jacqulynn Cadet M.D.   On: 12/20/2020 16:10     Subjective: Seen and examined at bedside and patient was doing much better and felt well.  Denied any nausea or vomiting.  Had some slight abdominal discomfort but this was manageable.  JP drain was not putting out as much.  No other concerns or complaints at this time and GI recommended discharging home as he is stable and placed on empiric antibiotics for 2 weeks.  No other concerns or complaints at this time.  Discharge Exam: Vitals:   12/21/20 0538 12/21/20 0552  BP: (!) 177/106 (!) 141/93  Pulse: 93 93  Resp: 20   Temp: 98 F (36.7 C)   SpO2: 96% 92%   Vitals:   12/21/20 0130 12/21/20 0500 12/21/20 0538 12/21/20 0552  BP: (!) 142/101  (!) 177/106 (!) 141/93  Pulse: 94  93 93  Resp: 18  20   Temp: 97.8 F (36.6 C)  98 F (36.7 C)   TempSrc: Oral  Oral   SpO2: 92%  96% 92%  Weight:  101.8 kg    Height:       General: Pt is alert, awake, not in acute distress Cardiovascular: RRR, S1/S2 +, no rubs, no gallops Respiratory: Diminished  bilaterally, no wheezing, no rhonchi; Unlabored breathing and not wearing supplemental O2 via  Abdominal: Soft, Mildly tender, distended 2/2 body habitus, bowel sounds + Extremities: no edema, no cyanosis  The results of significant  diagnostics from this hospitalization (including imaging, microbiology, ancillary and laboratory) are listed below for reference.    Microbiology: No results found for this or any previous visit (from the past 240 hour(s)).   Labs: BNP (last 3 results) No results for input(s): BNP in the last 8760 hours. Basic Metabolic Panel: Recent Labs  Lab 12/20/20 1815 12/21/20 0637  NA 135 138  K 4.1 4.4  CL 95* 96*  CO2 30 31  GLUCOSE 141* 120*  BUN 11 12  CREATININE 1.20 0.96  CALCIUM 8.5* 8.6*  MG 2.1  --   PHOS 6.9*  --    Liver Function Tests: Recent Labs  Lab 12/20/20 1815 12/21/20 0637  AST 24 18  ALT 20 17  ALKPHOS 244* 221*  BILITOT 0.9  0.4  PROT 6.6 6.5  ALBUMIN 2.1* 1.9*   No results for input(s): LIPASE, AMYLASE in the last 168 hours. No results for input(s): AMMONIA in the last 168 hours. CBC: Recent Labs  Lab 12/20/20 1815 12/21/20 0001 12/21/20 0637 12/21/20 1134 12/21/20 1452  WBC 27.1*  --  16.0*  --  19.4*  NEUTROABS  --   --   --   --  16.5*  HGB 10.7* 10.5* 10.9* 10.8* 10.4*  HCT 34.2* 33.5* 34.7* 35.0* 33.3*  MCV 90.0  --  89.9  --  89.8  PLT 504*  --  582*  --  604*   Cardiac Enzymes: No results for input(s): CKTOTAL, CKMB, CKMBINDEX, TROPONINI in the last 168 hours. BNP: Invalid input(s): POCBNP CBG: Recent Labs  Lab 12/21/20 0732  GLUCAP 119*   D-Dimer No results for input(s): DDIMER in the last 72 hours. Hgb A1c No results for input(s): HGBA1C in the last 72 hours. Lipid Profile No results for input(s): CHOL, HDL, LDLCALC, TRIG, CHOLHDL, LDLDIRECT in the last 72 hours. Thyroid function studies No results for input(s): TSH, T4TOTAL, T3FREE, THYROIDAB in the last 72 hours.  Invalid input(s): FREET3 Anemia work up No results for input(s): VITAMINB12, FOLATE, FERRITIN, TIBC, IRON, RETICCTPCT in the last 72 hours. Urinalysis    Component Value Date/Time   COLORURINE YELLOW 04/08/2017 1603   APPEARANCEUR CLEAR 04/08/2017 1603    LABSPEC 1.019 04/08/2017 1603   PHURINE 6.0 04/08/2017 1603   GLUCOSEU NEGATIVE 04/08/2017 1603   HGBUR NEGATIVE 04/08/2017 1603   BILIRUBINUR NEGATIVE 04/08/2017 1603   KETONESUR NEGATIVE 04/08/2017 1603   PROTEINUR NEGATIVE 04/08/2017 1603   UROBILINOGEN 0.2 10/24/2014 1136   NITRITE NEGATIVE 04/08/2017 1603   LEUKOCYTESUR NEGATIVE 04/08/2017 1603   Sepsis Labs Invalid input(s): PROCALCITONIN,  WBC,  LACTICIDVEN Microbiology No results found for this or any previous visit (from the past 240 hour(s)).  Time coordinating discharge: 35 minutes  SIGNED:  Kerney Elbe, DO [facility] 12/21/2020, 7:20 PM Pager is on [place]  If 7PM-7AM, please contact night-coverage [email or web address]

## 2020-12-21 NOTE — Progress Notes (Signed)
Brief GI Progress Note this PM  Patient was reevaluated this afternoon. Discussed with patient's sister (who was in the hospital) as well as mother (who is on FaceTime).  Overall the patient is doing significantly better than normal.  Lower extremity edema has improved.  He has more energy.  He is eating.  Worked with physical therapy with no contraindication to being able to be discharged.  Fluid output from the JP drain has significantly decreased.  Bandaging overlay and loss of fluid from that region has decreased as well. I am astonishing happy to see such a significant improvement after yesterday's ERCP and pseudocyst stent removal. He has no evidence of active bleeding. Appreciate our surgical services having opportunity to see the patient and our medical service for bringing the patient in for observation. I would not want to keep the patient until tomorrow for potential repeat ERCP and metal biliary stenting if the output of his JP drain had returned to the preprocedure levels but they certainly have not.  1 could query whether the drain has moved positions or if the patient could be leaking somewhere else but if that were to be the case you would expect the patient to have more significant pain does not.  The patient and family feel comfortable with potential discharge.  Patient has had persistent leukocytosis for weeks now so I am not as worried about the elevation because it has improved since his admission we certainly want to be mindful of things.  Reviewed the imaging from yesterday as well and discussed this with Dr. Tarri Glenn and I read discussed with the patient's family and there has been evidence of maturation and regression of the previous cyst.  Although this could still be causing patient to have some issues in regards to early satiety I am not convinced that I need to do a cyst aspiration at this time.  I am going to let the patient go home with close evaluation and monitoring regards to  any worsening or progression.  I am going to keep my Tuesday appointment on the books for now unless the patient has a dramatic worsening on Sunday or Monday at home I will then cancel the Tuesday appointment and rescheduled that for few weeks out when I return.  If the patient has worsening abdominal pain outside of what he has tolerated or had over the last 6 to 8 weeks, he will let us know at which point I would plan a repeat cross-sectional CT abdomen with IV and oral contrast to ensure that a new fluid collection has not developed another region.  He likely will then be admitted to the hospital or need a metal biliary stent to be placed distally and an attempt at biliary stenting into the right system.  Although again no active evidence of a normal or central biliary leak has been noted, after speaking with our surgeons there could be 1 last benefit with trying to change pressures within the biliary system before undergoing surgery.  I will forward follow-up to the patient's primary surgeon Dr. Constance Haw.  We will also work on trying to have the patient be evaluated by Dr. Zenia Resides of pancreatico biliary surgery here in [place] should need for surgical management be needed in the future if biliary leak persists from what is presumed to be a peripheral biliary leak.  The patient and family are in agreement with this plan.  All patient questions were answered to the best of my ability, and the patient agrees to  the aforementioned plan of action with follow-up as indicated.  Will discuss potential discharge with medicine service.  Continue Augmentin for 1 week postdischarge.  Then will be on ciprofloxacin/Flagyl for 1 week after that. Repeat cross-sectional imaging in 3 weeks will be arranged through my office.    Justice Britain, MD [facility] Office # [phone]

## 2020-12-22 ENCOUNTER — Encounter (HOSPITAL_COMMUNITY): Payer: Self-pay | Admitting: Gastroenterology

## 2020-12-22 SURGERY — ERCP, WITH INTERVENTION IF INDICATED
Anesthesia: General

## 2020-12-23 ENCOUNTER — Telehealth: Payer: Self-pay

## 2020-12-23 NOTE — Telephone Encounter (Signed)
Transition Care Management Follow-up Telephone Call Date of discharge and from where: 12/21/20 - [name] Diagnosis: hemorrhage following ERCP How have you been since you were released from the hospital? Stable, still weak Any questions or concerns? No  Items Reviewed: Did the pt receive and understand the discharge instructions provided? Yes  Medications obtained and verified? Yes  Other? No  Any new allergies since your discharge? No  Dietary orders reviewed? Yes Do you have support at home? Yes   Home Care and Equipment/Supplies: Were home health services ordered? no  Were any new equipment or medical supplies ordered?  No  Functional Questionnaire: (I = Independent and D = Dependent) ADLs: I  Bathing/Dressing- I  Meal Prep- I  Eating- I  Maintaining continence- I  Transferring/Ambulation- I  Managing Meds- I  Follow up appointments reviewed:  PCP Hospital f/u appt confirmed? Yes  Scheduled to see Gottschalk on 6/27 @ 11. [facility] f/u appt confirmed? Yes  Scheduled to see Mansouraty on 6/29 @ 10:50. Are transportation arrangements needed? No  If their condition worsens, is the pt aware to call PCP or go to the Emergency Dept.? Yes Was the patient provided with contact information for the PCP's office or ED? Yes Was to pt encouraged to call back with questions or concerns? Yes

## 2020-12-24 ENCOUNTER — Other Ambulatory Visit: Payer: Self-pay

## 2020-12-24 ENCOUNTER — Other Ambulatory Visit: Payer: Self-pay | Admitting: Gastroenterology

## 2020-12-24 ENCOUNTER — Encounter: Payer: Self-pay | Admitting: Gastroenterology

## 2020-12-24 ENCOUNTER — Ambulatory Visit: Payer: Medicaid Other | Admitting: Gastroenterology

## 2020-12-24 LAB — SURGICAL PATHOLOGY

## 2020-12-24 MED ORDER — FLUCONAZOLE 200 MG PO TABS: ORAL_TABLET | ORAL | 0 refills | Status: AC

## 2020-12-30 ENCOUNTER — Ambulatory Visit (INDEPENDENT_AMBULATORY_CARE_PROVIDER_SITE_OTHER): Payer: Medicaid Other | Admitting: Family Medicine

## 2020-12-30 ENCOUNTER — Encounter: Payer: Self-pay | Admitting: Family Medicine

## 2020-12-30 ENCOUNTER — Other Ambulatory Visit: Payer: Self-pay

## 2020-12-30 ENCOUNTER — Telehealth: Payer: Self-pay

## 2020-12-30 VITALS — BP 135/87 | HR 97 | Temp 97.6°F | Ht 70.0 in | Wt 214.4 lb

## 2020-12-30 DIAGNOSIS — D72829 Elevated white blood cell count, unspecified: Secondary | ICD-10-CM | POA: Diagnosis not present

## 2020-12-30 DIAGNOSIS — Z720 Tobacco use: Secondary | ICD-10-CM

## 2020-12-30 DIAGNOSIS — K91840 Postprocedural hemorrhage and hematoma of a digestive system organ or structure following a digestive system procedure: Secondary | ICD-10-CM | POA: Diagnosis not present

## 2020-12-30 DIAGNOSIS — R062 Wheezing: Secondary | ICD-10-CM

## 2020-12-30 DIAGNOSIS — Z09 Encounter for follow-up examination after completed treatment for conditions other than malignant neoplasm: Secondary | ICD-10-CM

## 2020-12-30 MED ORDER — ALBUTEROL SULFATE HFA 108 (90 BASE) MCG/ACT IN AERS
2.0000 | INHALATION_SPRAY | Freq: Four times a day (QID) | RESPIRATORY_TRACT | 0 refills | Status: DC | PRN
Start: 2020-12-30 — End: 2021-09-04

## 2020-12-30 NOTE — Patient Instructions (Signed)

## 2020-12-30 NOTE — Telephone Encounter (Signed)
-----   Message from Irving Copas., MD sent at 12/22/2020  1:38 AM EDT ----- Regarding: Follow-up Steve Romero or covering RN, Please reach out to the patient on Monday and see how he is doing.  I will not be around as able to be my day off after this long hospital week.  Can still for me with the results especially if he is doing fine if there are significant concerns about how he is doing clinically he can forward to me as well but would likely also forward to the physician of the day. He has an appointment with me scheduled for Tuesday.  As long as he is doing well after discussion on Monday, he may cancel that Tuesday appointment and reschedule/overbook him with me in 3 weeks. 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 Thank you. GM

## 2020-12-30 NOTE — Progress Notes (Signed)
Subjective: CC: hospital discharge follow up PCP: [name], DO Steve Romero is a 50 y.o. male presenting to clinic today for:  1.  Hemorrhage following ERCP Patient was hospitalized after he had a hemorrhage following an ERCP.  This was after he was being evaluated for pancreatitis with pancreatic necrosis and persistent bile leak status post cholecystectomy and necrosectomy.  Prior to discharge his white blood cell count was elevated and GI felt that he was stable for discharge with outpatient antibiotics.  Initially to be treated with Augmentin for a week then switch over to Cipro Flagyl for another week.  He had no complaints or pain at discharge.  He is to follow-up with gastroenterology, GEN surgery in addition to today's PCP appointment.  Since discharge, patient has been "okay".  He denies any fevers.  He reports good urine output.  No hematochezia, melena.  Bowel movements have been a mix of loose stools and normal stools.  Continues to have intermittent abdominal pain but often does not have to rely on his opioid for this.  He still has a drain in place and the fluid output has been decreasing over the last couple of days.  Drainage color has remained the same.  No blood.  He does report decreased appetite but denies nausea or vomiting.  He intends to follow-up with gastroenterology and infectious disease soon.  He has been compliant with the antibiotics and is currently a couple of days into the Cipro and Flagyl.  He is status post treatment with Augmentin x1 week outpatient.  He also reports having completed the Diflucan.  Generally, he feels capable of performing his own ADLs and IADLs.  Does not feel the need for physical therapy or other assistance.  ROS: Per HPI  Allergies  Allergen Reactions   Desipramine Nausea Only and Rash   Past Medical History:  Diagnosis Date   Anxiety 10-2014   Bipolar disorder [facility]) age 59   COPD (chronic obstructive pulmonary  disease) ([place]) 2016   Depression age 85   Enlarged prostate    Hyperlipidemia 2016   Hypertension 2013   Schizoaffective disorder ([place]) 11-13-14   Sleep apnea    Thyroid disease 11-2014    Current Outpatient Medications:    acetaminophen (TYLENOL) 325 MG tablet, Take 2 tablets (650 mg total) by mouth every 6 (six) hours as needed for mild pain (or Fever >/= 101)., Disp: 20 tablet, Rfl: 0   ciprofloxacin (CIPRO) 500 MG tablet, Take 1 tablet (500 mg total) by mouth 2 (two) times daily for 7 days., Disp: 14 tablet, Rfl: 0   fluconazole (DIFLUCAN) 200 MG tablet, Take 2 tablets (400 mg total) by mouth daily for 1 day, THEN 1 tablet (200 mg total) daily for 13 days., Disp: 15 tablet, Rfl: 0   ibuprofen (ADVIL) 200 MG tablet, Take 400 mg by mouth every 6 (six) hours as needed for fever., Disp: , Rfl:    metroNIDAZOLE (FLAGYL) 500 MG tablet, Take 1 tablet (500 mg total) by mouth 3 (three) times daily for 7 days., Disp: 21 tablet, Rfl: 0   Nystatin (GERHARDT'S BUTT CREAM) CREA, Apply 1 application topically daily. (Patient taking differently: Apply 1 application topically daily as needed for irritation.), Disp: 1 each, Rfl: 0   ondansetron (ZOFRAN) 4 MG tablet, Take 1 tablet (4 mg total) by mouth every 6 (six) hours as needed for nausea., Disp: 20 tablet, Rfl: 0   oxyCODONE (OXY IR/ROXICODONE) 5 MG immediate release tablet, Take 1-2 tablets (  5-10 mg total) by mouth every 4 (four) hours as needed for severe pain or breakthrough pain., Disp: 30 tablet, Rfl: 0 Social History   Socioeconomic History   Marital status: Single    Spouse name: Not on file   Number of children: Not on file   Years of education: Not on file   Highest education level: Not on file  Occupational History   Not on file  Tobacco Use   Smoking status: Every Day    Packs/day: 0.25    Years: 26.00    Pack years: 6.50    Types: Cigarettes   Smokeless tobacco: Never   Tobacco comments:    4-5 cig daily as of 10/07/2020.  Vaping  Use   Vaping Use: Never used  Substance and Sexual Activity   Alcohol use: No   Drug use: No   Sexual activity: Never    Birth control/protection: None  Other Topics Concern   Not on file  Social History Narrative   Not on file   Social Determinants of Health   Financial Resource Strain: Not on file  Food Insecurity: Not on file  Transportation Needs: Not on file  Physical Activity: Not on file  Stress: Not on file  Social Connections: Not on file  Intimate Partner Violence: Not on file   Family History  Problem Relation Age of Onset   Diabetes Father    Heart disease Father    Heart disease Maternal Grandmother    Stroke Maternal Grandfather     Objective: Office vital signs reviewed. BP 135/87   Pulse 97   Temp 97.6 F (36.4 C)   Ht 5' 10"  (1.778 m)   Wt 214 lb 6.4 oz (97.3 kg)   SpO2 97%   BMI 30.76 kg/m   Physical Examination:  General: Awake, alert, ill-appearing, No acute distress HEENT: Dark circles under eyes.  Sclera white Cardio: regular rate and rhythm, S1S2 heard, no murmurs appreciated Pulm: Global expiratory wheeze with fair air movement.  No rhonchi or rales.  Normal work of breathing on room air GI: Distended.  Minimal tenderness.  He has a drain in place on the right side of the abdomen.  This is draining cloudy, purulent appearing fluid.  No blood appreciated in drain. MSK: Ambulating independently  Assessment/ Plan: 50 y.o. male   Hospital discharge follow-up - Plan: CBC with Differential, CMP14+EGFR, Magnesium, Phosphorus  Hemorrhage following endoscopic retrograde cholangiopancreatography (ERCP) - Plan: CBC with Differential, CMP14+EGFR, Magnesium, Phosphorus  Leukocytosis, unspecified type - Plan: CBC with Differential  Wheezing - Plan: albuterol (VENTOLIN HFA) 108 (90 Base) MCG/ACT inhaler  Tobacco use  I reviewed his hospital discharge summary and recommendations.  White blood cell count at time of discharge was uptrending to  19.4.  Hemoglobin was slightly down by about half a point to 10.4 and platelets were elevated at 604.  We will repeat these levels today and I will CC to his appropriate providers.  Overall he does appear ill but he has been through quite a lot since this cholecystectomy.  I have encouraged him to follow-up with gastroenterology and infectious disease as directed.  He voiced good understanding.  Wheezes were noted on today's exam and I suspect this is secondary to chronic tobacco use.  Albuterol inhaler has been provided.  Smoking cessation recommended  No orders of the defined types were placed in this encounter.  No orders of the defined types were placed in this encounter.   Today's visit is for Transitional  Care Management.  The patient was discharged from [facility] on 12/21/2020 with a primary diagnosis of hemorrhage after ERCP.   Contact with the patient and/or caregiver, by a clinical staff member, was made on 12/23/20 and was documented as a telephone encounter within the EMR.  Through chart review and discussion with the patient I have determined that management of their condition is of moderate complexity.    [name], DO [place] [phone]

## 2020-12-30 NOTE — Telephone Encounter (Signed)
Left message on machine to call back  

## 2020-12-31 LAB — CMP14+EGFR
ALT: 10 IU/L (ref 0–44)
AST: 9 IU/L (ref 0–40)
Albumin/Globulin Ratio: 0.9 — ABNORMAL LOW (ref 1.2–2.2)
Albumin: 2.7 g/dL — ABNORMAL LOW (ref 4.0–5.0)
Alkaline Phosphatase: 237 IU/L — ABNORMAL HIGH (ref 44–121)
BUN/Creatinine Ratio: 8 — ABNORMAL LOW (ref 9–20)
BUN: 6 mg/dL (ref 6–24)
Bilirubin Total: 0.4 mg/dL (ref 0.0–1.2)
CO2: 26 mmol/L (ref 20–29)
Calcium: 8.9 mg/dL (ref 8.7–10.2)
Chloride: 96 mmol/L (ref 96–106)
Creatinine, Ser: 0.8 mg/dL (ref 0.76–1.27)
Globulin, Total: 3.1 g/dL (ref 1.5–4.5)
Glucose: 120 mg/dL — ABNORMAL HIGH (ref 65–99)
Potassium: 5.2 mmol/L (ref 3.5–5.2)
Sodium: 137 mmol/L (ref 134–144)
Total Protein: 5.8 g/dL — ABNORMAL LOW (ref 6.0–8.5)
eGFR: 108 mL/min/{1.73_m2} (ref >59)

## 2020-12-31 LAB — CBC WITH DIFFERENTIAL/PLATELET
Basophils Absolute: 0.1 10*3/uL (ref 0.0–0.2)
Basos: 0 %
EOS (ABSOLUTE): 0.2 10*3/uL (ref 0.0–0.4)
Eos: 1 %
Hematocrit: 34.1 % — ABNORMAL LOW (ref 37.5–51.0)
Hemoglobin: 11.1 g/dL — ABNORMAL LOW (ref 13.0–17.7)
Immature Grans (Abs): 0.1 10*3/uL (ref 0.0–0.1)
Immature Granulocytes: 1 %
Lymphocytes Absolute: 1.1 10*3/uL (ref 0.7–3.1)
Lymphs: 7 %
MCH: 28 pg (ref 26.6–33.0)
MCHC: 32.6 g/dL (ref 31.5–35.7)
MCV: 86 fL (ref 79–97)
Monocytes Absolute: 0.9 10*3/uL (ref 0.1–0.9)
Monocytes: 5 %
Neutrophils Absolute: 14.8 10*3/uL — ABNORMAL HIGH (ref 1.4–7.0)
Neutrophils: 86 %
Platelets: 576 10*3/uL — ABNORMAL HIGH (ref 150–450)
RBC: 3.96 x10E6/uL — ABNORMAL LOW (ref 4.14–5.80)
RDW: 14.9 % (ref 11.6–15.4)
WBC: 17.3 10*3/uL — ABNORMAL HIGH (ref 3.4–10.8)

## 2020-12-31 LAB — MAGNESIUM: Magnesium: 2 mg/dL (ref 1.6–2.3)

## 2020-12-31 LAB — PHOSPHORUS: Phosphorus: 4.4 mg/dL — ABNORMAL HIGH (ref 2.8–4.1)

## 2020-12-31 NOTE — Telephone Encounter (Signed)
Left message on machine to call back  

## 2021-01-01 NOTE — Telephone Encounter (Signed)
CT completed on 12/20/20 Follow up on 7/20 with GM pt aware.

## 2021-01-02 ENCOUNTER — Telehealth (INDEPENDENT_AMBULATORY_CARE_PROVIDER_SITE_OTHER): Payer: Medicaid Other | Admitting: General Surgery

## 2021-01-02 DIAGNOSIS — K839 Disease of biliary tract, unspecified: Secondary | ICD-10-CM

## 2021-01-02 NOTE — Telephone Encounter (Signed)
[facility]  Talked to Dr. Michaelle Birks, Hepatobiliary and discussed plan for HIDA and timing. Discussed that she thinks we can go ahead and order. She said she would be happy to see him versus me seeing him and I can just continue to follow him.  I have ordered a HIDA and will follow up. The clinic is not back until 7/5 and will get it scheduled.  I left Becky a message and could not get an answer on the phone at [place] home number.  Curlene Labrum, MD [facility] [address] [place], [place] [address] [phone] (office)

## 2021-01-07 NOTE — Telephone Encounter (Signed)
Patents sister called and said the patient did a CT at the hospital not sure of results and if he would need to do another CT. Also said he was on antibiotic and he just finished them has no refill so they are not sure if he should continue them being that they now found yeast in his esophagus. Please advise.

## 2021-01-07 NOTE — Telephone Encounter (Signed)
Left message on machine to call back  

## 2021-01-08 ENCOUNTER — Other Ambulatory Visit: Payer: Self-pay

## 2021-01-08 DIAGNOSIS — K8689 Other specified diseases of pancreas: Secondary | ICD-10-CM

## 2021-01-08 NOTE — Telephone Encounter (Signed)
Left message on machine to call back  

## 2021-01-08 NOTE — Telephone Encounter (Signed)
Pt's sister returned call and was transferred to nurse's vm.

## 2021-01-08 NOTE — Telephone Encounter (Signed)
CT scan order entered and sent to the schedulers to set up for 2-3 days prior to the upcoming appt with Dr Rush Landmark on 7/20.

## 2021-01-09 ENCOUNTER — Telehealth (INDEPENDENT_AMBULATORY_CARE_PROVIDER_SITE_OTHER): Payer: Medicaid Other | Admitting: General Surgery

## 2021-01-09 DIAGNOSIS — K810 Acute cholecystitis: Secondary | ICD-10-CM

## 2021-01-09 DIAGNOSIS — K8689 Other specified diseases of pancreas: Secondary | ICD-10-CM

## 2021-01-09 DIAGNOSIS — K839 Disease of biliary tract, unspecified: Secondary | ICD-10-CM

## 2021-01-09 MED ORDER — OXYCODONE HCL 5 MG PO TABS: 5.0000 mg | ORAL_TABLET | ORAL | 0 refills | Status: DC | PRN

## 2021-01-09 NOTE — Telephone Encounter (Signed)
I spoke with the pt's sister and she is aware that the schedulers will be calling to set up CT scan.  She will also make sure he keeps appt on 7/20 with GM

## 2021-01-09 NOTE — Telephone Encounter (Signed)
[facility]  Talked to [place]. Had a plan with Dr. Zenia Resides and Dr. Rush Landmark to do a Hida scan. This is ordered for 7/12. He is not eating great and only eating watermelon, cucumber and sherbet. He is not getting in much protein. He tried ensures but not taking in much. Jacqlyn Larsen says he is down to 205 lbs.    He sees Dr. Baxter Flattery 7/11 with ID and Dr. Rush Landmark 7/20.  I had talked with Dr. Zenia Resides and made plan to order [facility] and then see what it shows before having him see her.    Jacqlyn Larsen says the drain is putting out more and it is "mountain dew yellow /green" in color.   I have refilled his pain meds. I have asked her to get him to drink ensure frozen like a milkshake and see if he likes this better to get protein in.  She reports Dr. Baxter Flattery had talked about something to help him eat. I will reach out to Dr. Baxter Flattery and Dr. Rush Landmark about any stimulant versus something like reglan to help his stomach empty as he may just feel full. He has not had signs of Gastric outlet obstruction to date.   I told Jacqlyn Larsen I will call them after the Hida scan.  Curlene Labrum, MD [facility] [address] [place], [place] [address] [phone] (office)

## 2021-01-09 NOTE — Telephone Encounter (Signed)
Patient's sister called said Dr. Constance Haw also ordered a NM Hepatobiliary test to be done on 01/14/21 and she would like to know if that test is necessary for the patient to do.

## 2021-01-09 NOTE — Telephone Encounter (Signed)
Per VO Dr Rush Landmark recommends that the pt proceed with hepat scan as ordered by Dr Constance Haw.  The pt sister has been advised and agrees to the plan.

## 2021-01-13 ENCOUNTER — Other Ambulatory Visit: Payer: Self-pay

## 2021-01-13 ENCOUNTER — Ambulatory Visit (INDEPENDENT_AMBULATORY_CARE_PROVIDER_SITE_OTHER): Payer: Medicaid Other | Admitting: Internal Medicine

## 2021-01-13 VITALS — BP 111/72 | HR 90 | Resp 16 | Ht 70.0 in | Wt 212.0 lb

## 2021-01-13 DIAGNOSIS — E43 Unspecified severe protein-calorie malnutrition: Secondary | ICD-10-CM

## 2021-01-13 DIAGNOSIS — K8689 Other specified diseases of pancreas: Secondary | ICD-10-CM

## 2021-01-13 NOTE — Progress Notes (Signed)
RFV: follow up for necrotic pancreatitis  Patient ID: Steve Romero, male   DOB: Feb 10, 1971, 50 y.o.   MRN: [id number]  HPI Steve Romero is a 50yo M who has had complicated course of necrotizing pancreatitis after recently had symptomatic cholelithiasis s/p laparoscopic gallbladder removal with OR report noticing empyema of the gallbladder, torn cystic duct, with intrahepatic bill leak on 4/6. He had ERCP to stent cystic duct but developed post ERCP pancreatitis. He had biliary drain placed to help drain fluid from pancreatitis/bile. He reports that he has No fever, chills, nightsweat. 3 weeks ago had stent removed. He has Finished abtx last week.  2 ensure drinks equivalent Has CT scan on friday HIDA scan tomorrow to see     Outpatient Encounter Medications as of 01/13/2021  Medication Sig   acetaminophen (TYLENOL) 325 MG tablet Take 2 tablets (650 mg total) by mouth every 6 (six) hours as needed for mild pain (or Fever >/= 101).   albuterol (VENTOLIN HFA) 108 (90 Base) MCG/ACT inhaler Inhale 2 puffs into the lungs every 6 (six) hours as needed for wheezing or shortness of breath.   ibuprofen (ADVIL) 200 MG tablet Take 400 mg by mouth every 6 (six) hours as needed for fever.   Nystatin (GERHARDT'S BUTT CREAM) CREA Apply 1 application topically daily. (Patient taking differently: Apply 1 application topically daily as needed for irritation.)   oxyCODONE (OXY IR/ROXICODONE) 5 MG immediate release tablet Take 1-2 tablets (5-10 mg total) by mouth every 4 (four) hours as needed for severe pain or breakthrough pain.   ondansetron (ZOFRAN) 4 MG tablet Take 1 tablet (4 mg total) by mouth every 6 (six) hours as needed for nausea. (Patient not taking: Reported on 01/13/2021)   No facility-administered encounter medications on file as of 01/13/2021.     Patient Active Problem List   Diagnosis Date Noted   Hemorrhage following endoscopic retrograde cholangiopancreatography (ERCP) 12/20/2020   Bile  leak    Protein-calorie malnutrition, severe 11/07/2020   Ascending cholangitis 11/05/2020   Disorder of bile duct stent 11/01/2020   Pancreatic necrosis    Elevated LFTs    Post-ERCP acute pancreatitis 10/11/2020   Empyema of gallbladder    Calculus of gallbladder without cholecystitis without obstruction 09/26/2020   Abdominal wall mass of right upper quadrant 09/26/2020   Neoplasm of uncertain behavior 27/25/3664   Right upper quadrant abdominal pain 09/24/2020   Mass of right upper extremity 02/14/2020   Unilateral recurrent inguinal hernia without obstruction or gangrene    Carpal tunnel syndrome, left 09/24/2016   Obesity 09/01/2015   HTN (hypertension) 04/22/2015   Hyperlipidemia 04/22/2015   Schizoaffective disorder ([place]) 04/22/2015   Anxiety state 04/22/2015   Cigarette nicotine dependence without complication 40/34/7425   Edema 04/22/2015   Lymphadenopathy 04/22/2015     Health Maintenance Due  Topic Date Due   COVID-19 Vaccine (1) Never done   COLONOSCOPY (Pts 45-64yrs Insurance coverage will need to be confirmed)  Never done   Pneumococcal Vaccine 38-34 Years old (2 - PCV) 09/19/2019     Review of Systems 12 point ros is negative except what is mentioned in hpi Physical Exam   BP 111/72   Pulse 90   Resp 16   Ht 5\' 10"  (1.778 m)   Wt 212 lb (96.2 kg)   SpO2 97%   BMI 30.42 kg/m   Gen = appears chronically ill, a x o by 3 HEENT= MMM, no scleral icterus Abd= - drain in abd, serous fluild  outside of drain  Ext = +1 - pitting edema  CBC Lab Results  Component Value Date   WBC 17.3 (H) 12/30/2020   RBC 3.96 (L) 12/30/2020   HGB 11.1 (L) 12/30/2020   HCT 34.1 (L) 12/30/2020   PLT 576 (H) 12/30/2020   MCV 86 12/30/2020   MCH 28.0 12/30/2020   MCHC 32.6 12/30/2020   RDW 14.9 12/30/2020   LYMPHSABS 1.1 12/30/2020   MONOABS 1.0 12/21/2020   EOSABS 0.2 12/30/2020    BMET Lab Results  Component Value Date   NA 137 12/30/2020   K 5.2 12/30/2020    CL 96 12/30/2020   CO2 26 12/30/2020   GLUCOSE 120 (H) 12/30/2020   BUN 6 12/30/2020   CREATININE 0.80 12/30/2020   CALCIUM 8.9 12/30/2020   GFRNONAA >60 12/21/2020   GFRAA 85 10/04/2019      Assessment and Plan Necrotizing pancreatitis with pseudocyst= he has been on abtx for roughly 10-11 wk. We will get Labs today to see that they are improved Call on Friday [phone] evening for ct results/ or cell listed  No systemic symptoms is reassuring  Leukocytosis = likley from inflammatory response to pancreatitis rather than infection  Will discuss plan to monitor off of abtx with surgery and gi  Protein-calorie malnutrition = continue to supplement with protein drinks

## 2021-01-14 ENCOUNTER — Encounter (HOSPITAL_COMMUNITY): Payer: Self-pay

## 2021-01-14 ENCOUNTER — Encounter (HOSPITAL_COMMUNITY)
Admission: RE | Admit: 2021-01-14 | Discharge: 2021-01-14 | Disposition: A | Discharge status: Home / Self Care | Payer: Medicaid Other | Source: Ambulatory Visit | Attending: General Surgery | Admitting: General Surgery

## 2021-01-14 DIAGNOSIS — K839 Disease of biliary tract, unspecified: Secondary | ICD-10-CM | POA: Insufficient documentation

## 2021-01-14 LAB — CBC WITH DIFFERENTIAL/PLATELET
Absolute Monocytes: 706 cells/uL (ref 200–950)
Basophils Absolute: 74 cells/uL (ref 0–200)
Basophils Relative: 0.5 %
Eosinophils Absolute: 235 cells/uL (ref 15–500)
Eosinophils Relative: 1.6 %
HCT: 31.5 % — ABNORMAL LOW (ref 38.5–50.0)
Hemoglobin: 10.2 g/dL — ABNORMAL LOW (ref 13.2–17.1)
Lymphs Abs: 1661 cells/uL (ref 850–3900)
MCH: 28.7 pg (ref 27.0–33.0)
MCHC: 32.4 g/dL (ref 32.0–36.0)
MCV: 88.7 fL (ref 80.0–100.0)
MPV: 9.1 fL (ref 7.5–12.5)
Monocytes Relative: 4.8 %
Neutro Abs: 12025 cells/uL — ABNORMAL HIGH (ref 1500–7800)
Neutrophils Relative %: 81.8 %
Platelets: 530 10*3/uL — ABNORMAL HIGH (ref 140–400)
RBC: 3.55 10*6/uL — ABNORMAL LOW (ref 4.20–5.80)
RDW: 15.8 % — ABNORMAL HIGH (ref 11.0–15.0)
Total Lymphocyte: 11.3 %
WBC: 14.7 10*3/uL — ABNORMAL HIGH (ref 3.8–10.8)

## 2021-01-14 LAB — COMPREHENSIVE METABOLIC PANEL
AG Ratio: 0.9 (calc) — ABNORMAL LOW (ref 1.0–2.5)
ALT: 20 U/L (ref 9–46)
AST: 11 U/L (ref 10–40)
Albumin: 3 g/dL — ABNORMAL LOW (ref 3.6–5.1)
Alkaline phosphatase (APISO): 886 U/L — ABNORMAL HIGH (ref 36–130)
BUN/Creatinine Ratio: 24 (calc) — ABNORMAL HIGH (ref 6–22)
BUN: 13 mg/dL (ref 7–25)
CO2: 30 mmol/L (ref 20–32)
Calcium: 8.6 mg/dL (ref 8.6–10.3)
Chloride: 98 mmol/L (ref 98–110)
Creat: 0.55 mg/dL — ABNORMAL LOW (ref 0.60–1.29)
Globulin: 3.4 g/dL (calc) (ref 1.9–3.7)
Glucose, Bld: 103 mg/dL — ABNORMAL HIGH (ref 65–99)
Potassium: 4.6 mmol/L (ref 3.5–5.3)
Sodium: 136 mmol/L (ref 135–146)
Total Bilirubin: 0.9 mg/dL (ref 0.2–1.2)
Total Protein: 6.4 g/dL (ref 6.1–8.1)

## 2021-01-14 LAB — LIPASE: Lipase: 53 U/L (ref 7–60)

## 2021-01-14 LAB — AMYLASE: Amylase: 30 U/L (ref 21–101)

## 2021-01-14 LAB — SEDIMENTATION RATE: Sed Rate: 62 mm/h — ABNORMAL HIGH (ref 0–15)

## 2021-01-14 LAB — C-REACTIVE PROTEIN: CRP: 55.1 mg/L — ABNORMAL HIGH (ref <8.0)

## 2021-01-14 MED ORDER — TECHNETIUM TC 99M MEBROFENIN IV KIT
5.0000 | PACK | Freq: Once | INTRAVENOUS | Status: AC | PRN
  Administered 2021-01-14: 5.5 via INTRAVENOUS

## 2021-01-15 ENCOUNTER — Telehealth (INDEPENDENT_AMBULATORY_CARE_PROVIDER_SITE_OTHER): Payer: Medicaid Other | Admitting: General Surgery

## 2021-01-15 DIAGNOSIS — K9189 Other postprocedural complications and disorders of digestive system: Secondary | ICD-10-CM

## 2021-01-15 DIAGNOSIS — K810 Acute cholecystitis: Secondary | ICD-10-CM

## 2021-01-15 DIAGNOSIS — K839 Disease of biliary tract, unspecified: Secondary | ICD-10-CM

## 2021-01-15 DIAGNOSIS — K859 Acute pancreatitis without necrosis or infection, unspecified: Secondary | ICD-10-CM

## 2021-01-15 NOTE — Telephone Encounter (Signed)
[facility]  HIDA negative and must be more ascites.  He gets a CT Friday. Jacqlyn Larsen says they can come see me next week.  The drain output is less but it is more leaking around the drain so it might be clogged. I think we should get it out. I have sent a message to Dr. Dionicio Stall and Dr. Zenia Resides to make sure they are good with removal.  Will see him next week to remove.  Updated Becky on lab work from yesterday, everything improving to stable overall.  Curlene Labrum, MD [facility] [address] [place], [place] [address] [phone] (office)

## 2021-01-17 ENCOUNTER — Other Ambulatory Visit: Payer: Self-pay

## 2021-01-17 ENCOUNTER — Ambulatory Visit (HOSPITAL_COMMUNITY)
Admission: RE | Admit: 2021-01-17 | Discharge: 2021-01-17 | Disposition: A | Discharge status: Home / Self Care | Payer: Medicaid Other | Source: Ambulatory Visit | Attending: Gastroenterology | Admitting: Gastroenterology

## 2021-01-17 DIAGNOSIS — K8689 Other specified diseases of pancreas: Secondary | ICD-10-CM | POA: Diagnosis not present

## 2021-01-17 MED ORDER — IOHEXOL 350 MG/ML SOLN
80.0000 mL | Freq: Once | INTRAVENOUS | Status: AC | PRN
  Administered 2021-01-17: 80 mL via INTRAVENOUS

## 2021-01-17 MED ORDER — SODIUM CHLORIDE (PF) 0.9 % IJ SOLN
INTRAMUSCULAR | Status: AC
  Filled 2021-01-17: qty 50

## 2021-01-21 ENCOUNTER — Other Ambulatory Visit: Payer: Self-pay

## 2021-01-21 DIAGNOSIS — K831 Obstruction of bile duct: Secondary | ICD-10-CM

## 2021-01-21 DIAGNOSIS — K839 Disease of biliary tract, unspecified: Secondary | ICD-10-CM

## 2021-01-22 ENCOUNTER — Ambulatory Visit: Payer: Medicaid Other | Admitting: Gastroenterology

## 2021-01-22 ENCOUNTER — Other Ambulatory Visit (INDEPENDENT_AMBULATORY_CARE_PROVIDER_SITE_OTHER): Payer: Medicaid Other

## 2021-01-22 ENCOUNTER — Encounter: Payer: Self-pay | Admitting: Gastroenterology

## 2021-01-22 VITALS — BP 120/70 | HR 94 | Ht 69.0 in | Wt 202.0 lb

## 2021-01-22 DIAGNOSIS — R932 Abnormal findings on diagnostic imaging of liver and biliary tract: Secondary | ICD-10-CM

## 2021-01-22 DIAGNOSIS — R634 Abnormal weight loss: Secondary | ICD-10-CM

## 2021-01-22 DIAGNOSIS — R748 Abnormal levels of other serum enzymes: Secondary | ICD-10-CM

## 2021-01-22 DIAGNOSIS — R6881 Early satiety: Secondary | ICD-10-CM

## 2021-01-22 DIAGNOSIS — K838 Other specified diseases of biliary tract: Secondary | ICD-10-CM

## 2021-01-22 DIAGNOSIS — K8689 Other specified diseases of pancreas: Secondary | ICD-10-CM

## 2021-01-22 DIAGNOSIS — R188 Other ascites: Secondary | ICD-10-CM | POA: Diagnosis not present

## 2021-01-22 DIAGNOSIS — R17 Unspecified jaundice: Secondary | ICD-10-CM

## 2021-01-22 LAB — COMPREHENSIVE METABOLIC PANEL
ALT: 66 U/L — ABNORMAL HIGH (ref 0–53)
AST: 24 U/L (ref 0–37)
Albumin: 2.9 g/dL — ABNORMAL LOW (ref 3.5–5.2)
Alkaline Phosphatase: 1870 U/L — ABNORMAL HIGH (ref 39–117)
BUN: 7 mg/dL (ref 6–23)
CO2: 29 mEq/L (ref 19–32)
Calcium: 8.9 mg/dL (ref 8.4–10.5)
Chloride: 96 mEq/L (ref 96–112)
Creatinine, Ser: 0.48 mg/dL (ref 0.40–1.50)
GFR: 120.85 mL/min (ref >60.00)
Glucose, Bld: 105 mg/dL — ABNORMAL HIGH (ref 70–99)
Potassium: 3.5 mEq/L (ref 3.5–5.1)
Sodium: 134 mEq/L — ABNORMAL LOW (ref 135–145)
Total Bilirubin: 3 mg/dL — ABNORMAL HIGH (ref 0.2–1.2)
Total Protein: 6.9 g/dL (ref 6.0–8.3)

## 2021-01-22 LAB — CBC
HCT: 35 % — ABNORMAL LOW (ref 39.0–52.0)
Hemoglobin: 11.7 g/dL — ABNORMAL LOW (ref 13.0–17.0)
MCHC: 33.3 g/dL (ref 30.0–36.0)
MCV: 87.9 fl (ref 78.0–100.0)
Platelets: 474 10*3/uL — ABNORMAL HIGH (ref 150.0–400.0)
RBC: 3.98 Mil/uL — ABNORMAL LOW (ref 4.22–5.81)
RDW: 18.1 % — ABNORMAL HIGH (ref 11.5–15.5)
WBC: 11.8 10*3/uL — ABNORMAL HIGH (ref 4.0–10.5)

## 2021-01-22 MED ORDER — OXYCODONE HCL 5 MG PO TABS: 5.0000 mg | ORAL_TABLET | ORAL | 0 refills | Status: DC | PRN

## 2021-01-22 MED ORDER — CIPROFLOXACIN HCL 500 MG PO TABS
500.0000 mg | ORAL_TABLET | Freq: Two times a day (BID) | ORAL | 0 refills | Status: DC
Start: 2021-01-22 — End: 2021-02-25

## 2021-01-22 NOTE — Patient Instructions (Addendum)
If you are age 50 or older, your body mass index should be between 23-30. Your Body mass index is 29.83 kg/m. If this is out of the aforementioned range listed, please consider follow up with your Primary Care Provider.  If you are age 50 or younger, your body mass index should be between 19-25. Your Body mass index is 29.83 kg/m. If this is out of the aformentioned range listed, please consider follow up with your Primary Care Provider.   __________________________________________________________  The [place] [facility] providers would like to encourage you to use [facility] to communicate with providers for non-urgent requests or questions.  Due to long hold times on the telephone, sending your provider a message by [facility] may be a faster and more efficient way to get a response.  Please allow 48 business hours for a response.  Please remember that this is for non-urgent requests.   Your provider has requested that you go to the basement level for lab work before leaving today. Press "B" on the elevator. The lab is located at the first door on the left as you exit the elevator.   IR Interventional  will contact you regarding Fluid being drawn from your drain.   We have sent the following medications to your pharmacy for you to pick up at your convenience: Oxycodone   Lasix will be sent after fluid has been drawn depending on results.   Due to recent changes in healthcare laws, you may see the results of your imaging and laboratory studies on MyChart before your provider has had a chance to review them.  We understand that in some cases there may be results that are confusing or concerning to you. Not all laboratory results come back in the same time frame and the provider may be waiting for multiple results in order to interpret others.  Please give [place] 48 hours in order for your provider to thoroughly review all the results before contacting the office for clarification of your results.   Thank you for  choosing me and [facility].  Dr. Rush Landmark

## 2021-01-22 NOTE — H&P (View-Only) (Signed)
[place] VISIT   Primary Care Provider Ronnie Doss [place], DO [place]  [id number] [phone]  Patient Profile: Steve Romero is a 50 y.o. 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  The patient presents to the [facility] for an evaluation and management of problem(s) noted below:  Problem List 1. Pancreatic necrosis   2. Early satiety   3. Abnormal CT of liver   4. Common bile duct dilation   5. Jaundice   6. Unintentional weight loss   7. Elevated alkaline phosphatase level   8. Other ascites     History of Present Illness Please see hospitalization and recent outpatient consultation note by PA [facility] for full details of HPI.  Interval History The patient is accompanied by his sister and mother today for a follow-up.  The patient has follow-up with Dr. Constance Haw.  He has undergone a HIDA scan that was negative for biliary leak.  Cross-sectional imaging showed evidence of persistent retroperitoneal inflammation but no organized fluid collections.  There was progressive biliary duct dilation being noted as well.  Today, the patient states that he is having significant drainage output from his JP drain site.  He is soaking his bandages just as he was before our procedure.  He is losing weight.  He is having early satiety and bloating.  No change in bowel habits.  His urine may be slightly darker than normal but he feels things are improved after he drinks a significant mount of fluids.  Patient is taking pain medication to try and help him get through this.  He is planning to see Dr. Constance Haw tomorrow for  consideration of JP drain removal.  The patient's sister significantly concerned about JP drain removal.  No overt fevers or chills are occurring at this time.  He is scared that he is never gone to get better.  GI Review of Systems Positive as above including some pyrosis, nausea Negative for dysphagia, odynophagia, vomiting, change in bowel habits, melena, hematochezia  Review of Systems General: Denies fevers/chills Cardiovascular: Denies chest pain/palpitations Pulmonary: Denies shortness of breath Gastroenterological: See HPI Genitourinary: As per HPI Hematological: Denies easy bruising/bleeding Dermatological: Denies jaundice Psychological: Mood is stable but he is scared he will never get better   Medications Current Outpatient Medications  Medication Sig Dispense Refill   acetaminophen (TYLENOL) 325 MG tablet Take 2 tablets (650 mg total) by mouth every 6 (six) hours as needed for mild pain (or Fever >/= 101). (Patient not taking: No sig reported) 20 tablet 0   albuterol (VENTOLIN HFA) 108 (90 Base) MCG/ACT inhaler Inhale 2 puffs into the lungs every 6 (six) hours as needed for wheezing or shortness of breath. 8 g 0   ciprofloxacin (CIPRO) 500 MG tablet Take 1 tablet (500 mg total) by mouth 2 (two) times daily. 20 tablet 0   Nystatin (GERHARDT'S BUTT CREAM) CREA Apply 1 application topically daily. (Patient not taking: Reported on 01/24/2021) 1 each 0   ondansetron (ZOFRAN) 4 MG tablet Take 1 tablet (4 mg total) by mouth every 6 (six) hours as needed for nausea. (Patient not taking: Reported on 01/24/2021) 20 tablet 0   oxyCODONE (OXY IR/ROXICODONE) 5 MG immediate release tablet Take 1-2 tablets (5-10 mg total) by mouth every  4 (four) hours as needed for severe pain or breakthrough pain. 30 tablet 0   No current facility-administered medications for this visit.    Allergies Allergies  Allergen Reactions   Desipramine Nausea Only and Rash    Histories Past Medical History:   Diagnosis Date   Anxiety 10-2014   Bipolar disorder [facility]) age 29   COPD (chronic obstructive pulmonary disease) ([place]) 2016   Depression age 61   Enlarged prostate    Hyperlipidemia 2016   Hypertension 2013   Schizoaffective disorder ([place]) 11-13-14   Sleep apnea    Thyroid disease 11-2014   Past Surgical History:  Procedure Laterality Date   BALLOON DILATION N/A 11/07/2020   Procedure: BALLOON DILATION;  Surgeon: Irving Copas., MD;  Location: [place];  Service: Gastroenterology;  Laterality: N/A;   BILIARY STENT PLACEMENT N/A 10/10/2020   Procedure: BILIARY STENT PLACEMENT;  Surgeon: Rogene Houston, MD;  Location[facility];  Service: Endoscopy;  Laterality: N/A;   BILIARY STENT PLACEMENT  11/09/2020   Procedure: BILIARY STENT PLACEMENT;  Surgeon: Rush Landmark Telford Nab., MD;  Location: [place];  Service: Gastroenterology;;   BILIARY STENT PLACEMENT  11/07/2020   Procedure: BILIARY STENT PLACEMENT;  Surgeon: Irving Copas., MD;  Location: [place];  Service: Gastroenterology;;   BILIARY STENT PLACEMENT  11/11/2020   Procedure: BILIARY STENT PLACEMENT;  Surgeon: Irving Copas., MD;  Location: [place];  Service: Gastroenterology;;   BILIARY STENT PLACEMENT  11/14/2020   Procedure: BILIARY STENT PLACEMENT;  Surgeon: Irving Copas., MD;  Location: [place];  Service: Gastroenterology;;   BIOPSY  11/07/2020   Procedure: BIOPSY;  Surgeon: Irving Copas., MD;  Location: [place];  Service: Gastroenterology;;   BIOPSY  12/20/2020   Procedure: BIOPSY;  Surgeon: Irving Copas., MD;  [facility];  Service: Gastroenterology;;   CARPAL TUNNEL RELEASE Left 02/21/2016   Procedure: CARPAL TUNNEL RELEASE;  Surgeon: Carole Civil, MD;  Location: [facility];  Service: Orthopedics;  Laterality: Left;   CHOLECYSTECTOMY N/A 10/09/2020   Procedure: LAPAROSCOPIC CHOLECYSTECTOMY;  Surgeon: Virl Cagey, MD;  Location: [facility];   Service: General;  Laterality: N/A;   CYST ENTEROSTOMY N/A 11/07/2020   Procedure: CYST ENTEROSTOMY;  Surgeon: Irving Copas., MD;  Location: [place];  Service: Gastroenterology;  Laterality: N/A;   CYST GASTROSTOMY  11/11/2020   Procedure: CYST NECROSECTOMY;  Surgeon: Rush Landmark Telford Nab., MD;  Location: [place];  Service: Gastroenterology;;   CYST GASTROSTOMY  11/14/2020   Procedure: CYST NECROSECTOMY;  Surgeon: Irving Copas., MD;  Location: [facility];  Service: Gastroenterology;;   ENDOSCOPIC RETROGRADE CHOLANGIOPANCREATOGRAPHY (ERCP) WITH PROPOFOL N/A 11/09/2020   Procedure: ENDOSCOPIC RETROGRADE CHOLANGIOPANCREATOGRAPHY (ERCP) WITH PROPOFOL;  Surgeon: Irving Copas., MD;  Location: [place];  Service: Gastroenterology;  Laterality: N/A;   ENDOSCOPIC RETROGRADE CHOLANGIOPANCREATOGRAPHY (ERCP) WITH PROPOFOL N/A 12/20/2020   Procedure: ENDOSCOPIC RETROGRADE CHOLANGIOPANCREATOGRAPHY (ERCP) WITH PROPOFOL;  Surgeon: Rush Landmark Telford Nab., MD;  [facility];  Service: Gastroenterology;  Laterality: N/A;   ERCP N/A 10/10/2020   Procedure: ENDOSCOPIC RETROGRADE CHOLANGIOPANCREATOGRAPHY (ERCP);  Surgeon: Rogene Houston, MD;  [facility];  Service: Endoscopy;  Laterality: N/A;   ESOPHAGOGASTRODUODENOSCOPY N/A 11/11/2020   Procedure: ESOPHAGOGASTRODUODENOSCOPY (EGD);  Surgeon: Irving Copas., MD;  Location: [place];  Service: Gastroenterology;  Laterality: N/A;   ESOPHAGOGASTRODUODENOSCOPY N/A 11/14/2020   Procedure: ESOPHAGOGASTRODUODENOSCOPY (EGD);  Surgeon: Irving Copas., MD;  Location: [place];  Service: Gastroenterology;  Laterality: N/A;   ESOPHAGOGASTRODUODENOSCOPY (  EGD) WITH PROPOFOL N/A 11/09/2020   Procedure: ESOPHAGOGASTRODUODENOSCOPY (EGD) WITH PROPOFOL;  Surgeon: Rush Landmark Telford Nab., MD;  Location: [place];  Service: Gastroenterology;  Laterality: N/A;   ESOPHAGOGASTRODUODENOSCOPY (EGD) WITH PROPOFOL  N/A 11/07/2020   Procedure: ESOPHAGOGASTRODUODENOSCOPY (EGD) WITH PROPOFOL;  Surgeon: Rush Landmark Telford Nab., MD;  Location: [place];  Service: Gastroenterology;  Laterality: N/A;   ESOPHAGOGASTRODUODENOSCOPY (EGD) WITH PROPOFOL N/A 12/20/2020   Procedure: ESOPHAGOGASTRODUODENOSCOPY (EGD) WITH PROPOFOL;  Surgeon: Rush Landmark Telford Nab., MD;  [facility];  Service: Gastroenterology;  Laterality: N/A;   EUS  11/14/2020   Procedure: UPPER ENDOSCOPIC ULTRASOUND (EUS) LINEAR;  Surgeon: Rush Landmark Telford Nab., MD;  Location: [place];  Service: Gastroenterology;;   GASTROINTESTINAL STENT REMOVAL  12/20/2020   Procedure: GASTROINTESTINAL STENT REMOVAL;  Surgeon: Irving Copas., MD;  [facility];  Service: Gastroenterology;;  cyst gastrostomy stent and double pig tail stents x2 removed   HERNIA REPAIR Left 2002   groin   INGUINAL HERNIA REPAIR Left 04/05/2017   Procedure: RECURRENT HERNIA REPAIR INGUINAL ADULT WITH MESH;  Surgeon: [name], MD;  [facility];  Service: General;  Laterality: Left;   MASS EXCISION Right 04/29/2020   Procedure: EXCISION MASS RIGHT WRIST;  Surgeon: [name], MD;  Location: [place];  Service: Orthopedics;  Laterality: Right;   MASS EXCISION Right 10/09/2020   Procedure: EXCISION MASS, ABDOMINAL WALL, 2CM;  Surgeon: Virl Cagey, MD;  [facility];  Service: General;  Laterality: Right;   REMOVAL OF STONES  12/20/2020   Procedure: REMOVAL OF STONES;  Surgeon: Irving Copas., MD;  [facility];  Service: Gastroenterology;;   Joan Mayans N/A 10/10/2020   Procedure: Joan Mayans;  Surgeon: Rogene Houston, MD;  [facility];  Service: Endoscopy;  Laterality: N/A;   SPYGLASS CHOLANGIOSCOPY N/A 12/20/2020   Procedure: SPYGLASS CHOLANGIOSCOPY;  Surgeon: Irving Copas., MD;  [facility];  Service: Gastroenterology;  Laterality: N/A;   STENT REMOVAL  11/09/2020    Procedure: STENT REMOVAL;  Surgeon: Irving Copas., MD;  Location: [place];  Service: Gastroenterology;;   Lavell Islam REMOVAL  11/11/2020   Procedure: STENT REMOVAL;  Surgeon: Irving Copas., MD;  Location: [place];  Service: Gastroenterology;;   Lavell Islam REMOVAL  11/14/2020   Procedure: STENT REMOVAL;  Surgeon: Irving Copas., MD;  Location: [place];  Service: Gastroenterology;;   Lavell Islam REMOVAL  12/20/2020   Procedure: STENT REMOVAL;  Surgeon: Irving Copas., MD;  Location[facility];  Service: Gastroenterology;;  biliary x2   UPPER ESOPHAGEAL ENDOSCOPIC ULTRASOUND (EUS) N/A 11/07/2020   Procedure: UPPER ESOPHAGEAL ENDOSCOPIC ULTRASOUND (EUS);  Surgeon: Irving Copas., MD;  Location: [place];  Service: Gastroenterology;  Laterality: N/A;   WOUND DEBRIDEMENT  11/09/2020   Procedure: CYST NECROSECTOMY;  Surgeon: Rush Landmark Telford Nab., MD;  [facility];  Service: Gastroenterology;;   Social History   Socioeconomic History   Marital status: Single    Spouse name: Not on file   Number of children: Not on file   Years of education: Not on file   Highest education level: Not on file  Occupational History   Not on file  Tobacco Use   Smoking status: Every Day    Packs/day: 0.25    Years: 26.00    Pack years: 6.50    Types: Cigarettes   Smokeless tobacco: Never   Tobacco comments:    4-5 cig daily as of 10/07/2020.  Vaping Use   Vaping Use: Never used  Substance and Sexual Activity   Alcohol use: No   Drug use: No   Sexual activity: Never    Birth control/protection: None  Other Topics Concern   Not on file  Social History Narrative   Not on file   Social Determinants of Health   Financial Resource Strain: Not on file  Food Insecurity: Not on file  Transportation Needs: Not on file  Physical Activity: Not on file  Stress: Not on file  Social Connections: Not on file  Intimate Partner Violence: Not on file    Family History  Problem Relation Age of Onset   Diabetes Father    Heart disease Father    Heart disease Maternal Grandmother    Stroke Maternal Grandfather    Colon cancer Neg Hx    Esophageal cancer Neg Hx    Inflammatory bowel disease Neg Hx    Liver disease Neg Hx    Pancreatic cancer Neg Hx    Rectal cancer Neg Hx    Stomach cancer Neg Hx    I have reviewed his medical, social, and family history in detail and updated the electronic medical record as necessary.    PHYSICAL EXAMINATION  BP 120/70   Pulse 94   Ht _0  (1.753 m)   Wt 202 lb (91.6 kg)   BMI 29.83 kg/m  Wt Readings from Last 3 Encounters:  01/23/21 202 lb (91.6 kg)  01/22/21 202 lb (91.6 kg)  01/13/21 212 lb (96.2 kg)  GEN: NAD, appears stated age, doesn't appear chronically ill, he does look thinner than my last evaluation when he was in the hospital PSYCH: Cooperative, without pressured speech EYE: Sclerae are anicteric today ENT: MMM CV: Nontachycardic RESP: No audible wheezing GI: NABS, soft, protuberant abdomen, tenderness to palpation in midepigastrium and left upper quadrant, without rebound or guarding  MSK/EXT: Bilateral pedal edema is present (slightly worse than when he was in the hospital) SKIN: There may be developing jaundice NEURO:  Alert & Oriented x 3, no focal deficits   REVIEW OF DATA  I reviewed the following data at the time of this encounter:  GI Procedures and Studies  June 2022 ERCP - No gross lesions in esophagus proximally. White nummular lesions in esophageal mucosa- biopsied. - Z-line irregular, 39 cm from the incisors. - Erythematous mucosa in the stomach. - AXIOS cystgastrostomy stent and double pigtail - removed. More oozing/bleeding than normally expected but this subsided with time. Cyst cavity looked good and clean. - Angulation deformity in the gastric body vs extrinsic impression. - Erythematous duodenopathy. - Prior biliary sphincterotomy appeared open. -  Two visibly patent stents from the biliary tree were seen in the major papilla. These were removed. - Congested biliary tract mucosa was found. - The upper third of the main bile duct and middle third of the main bile duct were moderately dilated. - The biliary tree was swept and sludge was found. - No evidence of biliary leak noted upon balloon occlusion and super-selective cannulation of cystic duct.  Laboratory Studies  Reviewed those in epic  Imaging Studies  July 2022 CT abdomen with contrast IMPRESSION: 1. Progressive intra and extrahepatic biliary dilatation with resolution of pneumobilia consistent with recurrent extrahepatic biliary obstruction, attributed to the retroperitoneal inflammatory changes. Patient may benefit from biliary stent placement. 2. Cannot confirm patency of the splenic and superior mesenteric veins on this study with evaluation limited by suboptimal contrast bolus; suspected occlusion due to extrinsic compression. 3. Grossly stable extensive inflammatory changes within  and surrounding the pancreas consistent with pancreatic necrosis and surrounding inflammation. 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 No enlarging fluid collections are seen. 4. Evolving subacute splenic infarcts.  July 2022 HIDA IMPRESSION: Patent biliary tree post cholecystectomy without evidence of CBD obstruction or bile leak.   ASSESSMENT  Mr. Channing is a 50 y.o. 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  The patient is seen today for evaluation and management of:  1. Pancreatic necrosis   2. Early satiety   3.  Abnormal CT of liver   4. Common bile duct dilation   5. Jaundice   6. Unintentional weight loss   7. Elevated alkaline phosphatase level   8. Other ascites    The patient is hemodynamically stable.  However, clinically he is not doing as well.  His JP drain output does not look to be overtly bilious at this time and we have a negative HIDA scan suggestive of no further biliary leak (as had been my finding at the time of his last ERCP).  It is concerning that the fluid that is coming out of his abdomen may be ascites and the etiology of this ascites is not completely defined as of yet.  We will try to obtain JP drain studies in clinic versus through [place] and I will asked my team to see if this is possible.  I have some concerns about the patient's eyes with potential development of progressive biliary obstruction.  We have already scheduled the patient for a repeat ERCP next week because of the concern for progressive biliary obstruction.  With the patient's unintentional weight loss, I have concerns as to whether this patient will be able to remain an outpatient or if he will need to come into the hospital for TPN/enteral nutrition via NG tube/core track.  I would hold on removing the JP drain until the fluid studies have returned and we see how he does after ERCP with biliary stent placement.  My plan is for a fully covered self-expanding metal stent to be placed with hopes that will relieve the obstruction for now.  The patient has weeks to months to continue to improve if he does so time will tell.  There are no drainable fluid collections at this time.  We will obtain some basic laboratories follow-up with the patient next week.  The risks of an ERCP were discussed at length, including but not limited to the risk of perforation, bleeding, abdominal pain, post-ERCP pancreatitis (while usually mild can be severe and even life threatening).  All patient questions were answered to the best of my ability,  and the patient agrees to the aforementioned plan of action with follow-up as indicated.   PLAN  Proceed with laboratories as outlined below Proceed with scheduling ERCP next week with attempt at fully covered self-expanding metal stent placement JP drain fluid studies to be obtained - Total protein - Albumin - LDH - Cell counts - Amylase - Lipase - Triglycerides - Fluid culture Hold on JP drain removal for now Continue to work on intake of protein shakes 2-3 times per day If patient is having progressive weight loss into next week then there is a chance he may need to be admitted after his ERCP for enteral  nutrition/TPN initiation Offered referral to [facility] or [place] or [place] but the patient/family defer on this currently with hope that he will continue to improve   Orders Placed This Encounter  Procedures   CBC   Comp Met (CMET)    New Prescriptions   CIPROFLOXACIN (CIPRO) 500 MG TABLET    Take 1 tablet (500 mg total) by mouth 2 (two) times daily.   Modified Medications   Modified Medication Previous Medication   OXYCODONE (OXY IR/ROXICODONE) 5 MG IMMEDIATE RELEASE TABLET oxyCODONE (OXY IR/ROXICODONE) 5 MG immediate release tablet      Take 1-2 tablets (5-10 mg total) by mouth every 4 (four) hours as needed for severe pain or breakthrough pain.    Take 1-2 tablets (5-10 mg total) by mouth every 4 (four) hours as needed for severe pain or breakthrough pain.    Planned Follow Up No follow-ups on file.   Total Time in Face-to-Face and in Coordination of Care for patient including independent/personal interpretation/review of prior testing, medical history, examination, medication adjustment, communicating results with the patient directly, and documentation with the EHR is 40 minutes.   Justice Britain, MD [facility] Office # [phone]    5 PM addendum Patient's laboratories were evaluated and he has evidence of jaundice  at this time.  Leukocytosis is the best it has been over the course of last 6 weeks.  Will initiate ciprofloxacin 500 mg twice daily as prophylaxis against cholangitis.  If the patient develops progressive fevers (greater than 100.3) or chills or worsening symptoms he will come into the hospital after calling to update [place].  He may require ERCP earlier in the next week if that is the case.  I spoke with the patient and patient's sister about this and they agree with this plan of action

## 2021-01-22 NOTE — Progress Notes (Signed)
[place] VISIT   Primary Care Provider Ronnie Doss [place], DO [place] [address] [phone]  Patient Profile: Steve Romero is a 50 y.o. 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  The patient presents to the [facility] for an evaluation and management of problem(s) noted below:  Problem List 1. Pancreatic necrosis   2. Early satiety   3. Abnormal CT of liver   4. Common bile duct dilation   5. Jaundice   6. Unintentional weight loss   7. Elevated alkaline phosphatase level   8. Other ascites     History of Present Illness Please see hospitalization and recent outpatient consultation note by PA [facility] for full details of HPI.  Interval History The patient is accompanied by his sister and mother today for a follow-up.  The patient has follow-up with Dr. Constance Haw.  He has undergone a HIDA scan that was negative for biliary leak.  Cross-sectional imaging showed evidence of persistent retroperitoneal inflammation but no organized fluid collections.  There was progressive biliary duct dilation being noted as well.  Today, the patient states that he is having significant drainage output from his JP drain site.  He is soaking his bandages just as he was before our procedure.  He is losing weight.  He is having early satiety and bloating.  No change in bowel habits.  His urine may be slightly darker than normal but he feels things are improved after he drinks a significant mount of fluids.  Patient is taking pain medication to try and help him get through this.  He is planning to see Dr. Constance Haw tomorrow for  consideration of JP drain removal.  The patient's sister significantly concerned about JP drain removal.  No overt fevers or chills are occurring at this time.  He is scared that he is never gone to get better.  GI Review of Systems Positive as above including some pyrosis, nausea Negative for dysphagia, odynophagia, vomiting, change in bowel habits, melena, hematochezia  Review of Systems General: Denies fevers/chills Cardiovascular: Denies chest pain/palpitations Pulmonary: Denies shortness of breath Gastroenterological: See HPI Genitourinary: As per HPI Hematological: Denies easy bruising/bleeding Dermatological: Denies jaundice Psychological: Mood is stable but he is scared he will never get better   Medications Current Outpatient Medications  Medication Sig Dispense Refill   acetaminophen (TYLENOL) 325 MG tablet Take 2 tablets (650 mg total) by mouth every 6 (six) hours as needed for mild pain (or Fever >/= 101). (Patient not taking: No sig reported) 20 tablet 0   albuterol (VENTOLIN HFA) 108 (90 Base) MCG/ACT inhaler Inhale 2 puffs into the lungs every 6 (six) hours as needed for wheezing or shortness of breath. 8 g 0   ciprofloxacin (CIPRO) 500 MG tablet Take 1 tablet (500 mg total) by mouth 2 (two) times daily. 20 tablet 0   Nystatin (GERHARDT'S BUTT CREAM) CREA Apply 1 application topically daily. (Patient not taking: Reported on 01/24/2021) 1 each 0   ondansetron (ZOFRAN) 4 MG tablet Take 1 tablet (4 mg total) by mouth every 6 (six) hours as needed for nausea. (Patient not taking: Reported on 01/24/2021) 20 tablet 0   oxyCODONE (OXY IR/ROXICODONE) 5 MG immediate release tablet Take 1-2 tablets (5-10 mg total) by mouth every  4 (four) hours as needed for severe pain or breakthrough pain. 30 tablet 0   No current facility-administered medications for this visit.    Allergies Allergies  Allergen Reactions   Desipramine Nausea Only and Rash    Histories Past Medical History:   Diagnosis Date   Anxiety 10-2014   Bipolar disorder [facility]) age 29   COPD (chronic obstructive pulmonary disease) ([place]) 2016   Depression age 61   Enlarged prostate    Hyperlipidemia 2016   Hypertension 2013   Schizoaffective disorder ([place]) 11-13-14   Sleep apnea    Thyroid disease 11-2014   Past Surgical History:  Procedure Laterality Date   BALLOON DILATION N/A 11/07/2020   Procedure: BALLOON DILATION;  Surgeon: Irving Copas., MD;  Location: [place];  Service: Gastroenterology;  Laterality: N/A;   BILIARY STENT PLACEMENT N/A 10/10/2020   Procedure: BILIARY STENT PLACEMENT;  Surgeon: Rogene Houston, MD;  Location: [facility];  Service: Endoscopy;  Laterality: N/A;   BILIARY STENT PLACEMENT  11/09/2020   Procedure: BILIARY STENT PLACEMENT;  Surgeon: Rush Landmark Telford Nab., MD;  Location: [place];  Service: Gastroenterology;;   BILIARY STENT PLACEMENT  11/07/2020   Procedure: BILIARY STENT PLACEMENT;  Surgeon: Irving Copas., MD;  Location: [place];  Service: Gastroenterology;;   BILIARY STENT PLACEMENT  11/11/2020   Procedure: BILIARY STENT PLACEMENT;  Surgeon: Irving Copas., MD;  Location: [place];  Service: Gastroenterology;;   BILIARY STENT PLACEMENT  11/14/2020   Procedure: BILIARY STENT PLACEMENT;  Surgeon: Irving Copas., MD;  Location: [place];  Service: Gastroenterology;;   BIOPSY  11/07/2020   Procedure: BIOPSY;  Surgeon: Irving Copas., MD;  Location: [place];  Service: Gastroenterology;;   BIOPSY  12/20/2020   Procedure: BIOPSY;  Surgeon: Irving Copas., MD;  [facility];  Service: Gastroenterology;;   CARPAL TUNNEL RELEASE Left 02/21/2016   Procedure: CARPAL TUNNEL RELEASE;  Surgeon: Carole Civil, MD;  [facility]: [facility];  Service: Orthopedics;  Laterality: Left;   CHOLECYSTECTOMY N/A 10/09/2020   Procedure: LAPAROSCOPIC CHOLECYSTECTOMY;  Surgeon: Virl Cagey, MD;  Location: [facility];   Service: General;  Laterality: N/A;   CYST ENTEROSTOMY N/A 11/07/2020   Procedure: CYST ENTEROSTOMY;  Surgeon: Irving Copas., MD;  Location: [place];  Service: Gastroenterology;  Laterality: N/A;   CYST GASTROSTOMY  11/11/2020   Procedure: CYST NECROSECTOMY;  Surgeon: Rush Landmark Telford Nab., MD;  Location: [place];  Service: Gastroenterology;;   CYST GASTROSTOMY  11/14/2020   Procedure: CYST NECROSECTOMY;  Surgeon: Irving Copas., MD;  Location: [facility];  Service: Gastroenterology;;   ENDOSCOPIC RETROGRADE CHOLANGIOPANCREATOGRAPHY (ERCP) WITH PROPOFOL N/A 11/09/2020   Procedure: ENDOSCOPIC RETROGRADE CHOLANGIOPANCREATOGRAPHY (ERCP) WITH PROPOFOL;  Surgeon: Irving Copas., MD;  Location: [place];  Service: Gastroenterology;  Laterality: N/A;   ENDOSCOPIC RETROGRADE CHOLANGIOPANCREATOGRAPHY (ERCP) WITH PROPOFOL N/A 12/20/2020   Procedure: ENDOSCOPIC RETROGRADE CHOLANGIOPANCREATOGRAPHY (ERCP) WITH PROPOFOL;  Surgeon: Rush Landmark Telford Nab., MD;  [facility];  Service: Gastroenterology;  Laterality: N/A;   ERCP N/A 10/10/2020   Procedure: ENDOSCOPIC RETROGRADE CHOLANGIOPANCREATOGRAPHY (ERCP);  Surgeon: Rogene Houston, MD;  [facility];  Service: Endoscopy;  Laterality: N/A;   ESOPHAGOGASTRODUODENOSCOPY N/A 11/11/2020   Procedure: ESOPHAGOGASTRODUODENOSCOPY (EGD);  Surgeon: Irving Copas., MD;  Location: [place];  Service: Gastroenterology;  Laterality: N/A;   ESOPHAGOGASTRODUODENOSCOPY N/A 11/14/2020   Procedure: ESOPHAGOGASTRODUODENOSCOPY (EGD);  Surgeon: Irving Copas., MD;  Location: [place];  Service: Gastroenterology;  Laterality: N/A;   ESOPHAGOGASTRODUODENOSCOPY (  EGD) WITH PROPOFOL N/A 11/09/2020   Procedure: ESOPHAGOGASTRODUODENOSCOPY (EGD) WITH PROPOFOL;  Surgeon: Rush Landmark Telford Nab., MD;  Location: [place];  Service: Gastroenterology;  Laterality: N/A;   ESOPHAGOGASTRODUODENOSCOPY (EGD) WITH PROPOFOL  N/A 11/07/2020   Procedure: ESOPHAGOGASTRODUODENOSCOPY (EGD) WITH PROPOFOL;  Surgeon: Rush Landmark Telford Nab., MD;  Location: [place];  Service: Gastroenterology;  Laterality: N/A;   ESOPHAGOGASTRODUODENOSCOPY (EGD) WITH PROPOFOL N/A 12/20/2020   Procedure: ESOPHAGOGASTRODUODENOSCOPY (EGD) WITH PROPOFOL;  Surgeon: Rush Landmark Telford Nab., MD;  [facility];  Service: Gastroenterology;  Laterality: N/A;   EUS  11/14/2020   Procedure: UPPER ENDOSCOPIC ULTRASOUND (EUS) LINEAR;  Surgeon: Rush Landmark Telford Nab., MD;  Location: [place];  Service: Gastroenterology;;   GASTROINTESTINAL STENT REMOVAL  12/20/2020   Procedure: GASTROINTESTINAL STENT REMOVAL;  Surgeon: Irving Copas., MD;  [facility];  Service: Gastroenterology;;  cyst gastrostomy stent and double pig tail stents x2 removed   HERNIA REPAIR Left 2002   groin   INGUINAL HERNIA REPAIR Left 04/05/2017   Procedure: RECURRENT HERNIA REPAIR INGUINAL ADULT WITH MESH;  Surgeon: [name], MD;  [facility];  Service: General;  Laterality: Left;   MASS EXCISION Right 04/29/2020   Procedure: EXCISION MASS RIGHT WRIST;  Surgeon: [name], MD;  Location: [place];  Service: Orthopedics;  Laterality: Right;   MASS EXCISION Right 10/09/2020   Procedure: EXCISION MASS, ABDOMINAL WALL, 2CM;  Surgeon: Virl Cagey, MD;  [facility];  Service: General;  Laterality: Right;   REMOVAL OF STONES  12/20/2020   Procedure: REMOVAL OF STONES;  Surgeon: Irving Copas., MD;  [facility];  Service: Gastroenterology;;   Joan Mayans N/A 10/10/2020   Procedure: Joan Mayans;  Surgeon: Rogene Houston, MD;  [facility];  Service: Endoscopy;  Laterality: N/A;   SPYGLASS CHOLANGIOSCOPY N/A 12/20/2020   Procedure: SPYGLASS CHOLANGIOSCOPY;  Surgeon: Irving Copas., MD;  [facility];  Service: Gastroenterology;  Laterality: N/A;   STENT REMOVAL  11/09/2020    Procedure: STENT REMOVAL;  Surgeon: Irving Copas., MD;  Location: [place];  Service: Gastroenterology;;   Lavell Islam REMOVAL  11/11/2020   Procedure: STENT REMOVAL;  Surgeon: Irving Copas., MD;  Location: [place];  Service: Gastroenterology;;   Lavell Islam REMOVAL  11/14/2020   Procedure: STENT REMOVAL;  Surgeon: Irving Copas., MD;  Location: [place];  Service: Gastroenterology;;   Lavell Islam REMOVAL  12/20/2020   Procedure: STENT REMOVAL;  Surgeon: Irving Copas., MD;  Location[facility];  Service: Gastroenterology;;  biliary x2   UPPER ESOPHAGEAL ENDOSCOPIC ULTRASOUND (EUS) N/A 11/07/2020   Procedure: UPPER ESOPHAGEAL ENDOSCOPIC ULTRASOUND (EUS);  Surgeon: Irving Copas., MD;  Location: [place];  Service: Gastroenterology;  Laterality: N/A;   WOUND DEBRIDEMENT  11/09/2020   Procedure: CYST NECROSECTOMY;  Surgeon: Rush Landmark Telford Nab., MD;  [facility];  Service: Gastroenterology;;   Social History   Socioeconomic History   Marital status: Single    Spouse name: Not on file   Number of children: Not on file   Years of education: Not on file   Highest education level: Not on file  Occupational History   Not on file  Tobacco Use   Smoking status: Every Day    Packs/day: 0.25    Years: 26.00    Pack years: 6.50    Types: Cigarettes   Smokeless tobacco: Never   Tobacco comments:    4-5 cig daily as of 10/07/2020.  Vaping Use   Vaping Use: Never used  Substance and Sexual Activity   Alcohol use: No   Drug use: No   Sexual activity: Never    Birth control/protection: None  Other Topics Concern   Not on file  Social History Narrative   Not on file   Social Determinants of Health   Financial Resource Strain: Not on file  Food Insecurity: Not on file  Transportation Needs: Not on file  Physical Activity: Not on file  Stress: Not on file  Social Connections: Not on file  Intimate Partner Violence: Not on file    Family History  Problem Relation Age of Onset   Diabetes Father    Heart disease Father    Heart disease Maternal Grandmother    Stroke Maternal Grandfather    Colon cancer Neg Hx    Esophageal cancer Neg Hx    Inflammatory bowel disease Neg Hx    Liver disease Neg Hx    Pancreatic cancer Neg Hx    Rectal cancer Neg Hx    Stomach cancer Neg Hx    I have reviewed his medical, social, and family history in detail and updated the electronic medical record as necessary.    PHYSICAL EXAMINATION  BP 120/70   Pulse 94   Ht _0  (1.753 m)   Wt 202 lb (91.6 kg)   BMI 29.83 kg/m  Wt Readings from Last 3 Encounters:  01/23/21 202 lb (91.6 kg)  01/22/21 202 lb (91.6 kg)  01/13/21 212 lb (96.2 kg)  GEN: NAD, appears stated age, doesn't appear chronically ill, he does look thinner than my last evaluation when he was in the hospital PSYCH: Cooperative, without pressured speech EYE: Sclerae are anicteric today ENT: MMM CV: Nontachycardic RESP: No audible wheezing GI: NABS, soft, protuberant abdomen, tenderness to palpation in midepigastrium and left upper quadrant, without rebound or guarding  MSK/EXT: Bilateral pedal edema is present (slightly worse than when he was in the hospital) SKIN: There may be developing jaundice NEURO:  Alert & Oriented x 3, no focal deficits   REVIEW OF DATA  I reviewed the following data at the time of this encounter:  GI Procedures and Studies  June 2022 ERCP - No gross lesions in esophagus proximally. White nummular lesions in esophageal mucosa- biopsied. - Z-line irregular, 39 cm from the incisors. - Erythematous mucosa in the stomach. - AXIOS cystgastrostomy stent and double pigtail - removed. More oozing/bleeding than normally expected but this subsided with time. Cyst cavity looked good and clean. - Angulation deformity in the gastric body vs extrinsic impression. - Erythematous duodenopathy. - Prior biliary sphincterotomy appeared open. -  Two visibly patent stents from the biliary tree were seen in the major papilla. These were removed. - Congested biliary tract mucosa was found. - The upper third of the main bile duct and middle third of the main bile duct were moderately dilated. - The biliary tree was swept and sludge was found. - No evidence of biliary leak noted upon balloon occlusion and super-selective cannulation of cystic duct.  Laboratory Studies  Reviewed those in epic  Imaging Studies  July 2022 CT abdomen with contrast IMPRESSION: 1. Progressive intra and extrahepatic biliary dilatation with resolution of pneumobilia consistent with recurrent extrahepatic biliary obstruction, attributed to the retroperitoneal inflammatory changes. Patient may benefit from biliary stent placement. 2. Cannot confirm patency of the splenic and superior mesenteric veins on this study with evaluation limited by suboptimal contrast bolus; suspected occlusion due to extrinsic compression. 3. Grossly stable extensive inflammatory changes within  and surrounding the pancreas consistent with pancreatic necrosis and surrounding inflammation. 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 No enlarging fluid collections are seen. 4. Evolving subacute splenic infarcts.  July 2022 HIDA IMPRESSION: Patent biliary tree post cholecystectomy without evidence of CBD obstruction or bile leak.   ASSESSMENT  Mr. Channing is a 50 y.o. 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  The patient is seen today for evaluation and management of:  1. Pancreatic necrosis   2. Early satiety   3.  Abnormal CT of liver   4. Common bile duct dilation   5. Jaundice   6. Unintentional weight loss   7. Elevated alkaline phosphatase level   8. Other ascites    The patient is hemodynamically stable.  However, clinically he is not doing as well.  His JP drain output does not look to be overtly bilious at this time and we have a negative HIDA scan suggestive of no further biliary leak (as had been my finding at the time of his last ERCP).  It is concerning that the fluid that is coming out of his abdomen may be ascites and the etiology of this ascites is not completely defined as of yet.  We will try to obtain JP drain studies in clinic versus through [place] and I will asked my team to see if this is possible.  I have some concerns about the patient's eyes with potential development of progressive biliary obstruction.  We have already scheduled the patient for a repeat ERCP next week because of the concern for progressive biliary obstruction.  With the patient's unintentional weight loss, I have concerns as to whether this patient will be able to remain an outpatient or if he will need to come into the hospital for TPN/enteral nutrition via NG tube/core track.  I would hold on removing the JP drain until the fluid studies have returned and we see how he does after ERCP with biliary stent placement.  My plan is for a fully covered self-expanding metal stent to be placed with hopes that will relieve the obstruction for now.  The patient has weeks to months to continue to improve if he does so time will tell.  There are no drainable fluid collections at this time.  We will obtain some basic laboratories follow-up with the patient next week.  The risks of an ERCP were discussed at length, including but not limited to the risk of perforation, bleeding, abdominal pain, post-ERCP pancreatitis (while usually mild can be severe and even life threatening).  All patient questions were answered to the best of my ability,  and the patient agrees to the aforementioned plan of action with follow-up as indicated.   PLAN  Proceed with laboratories as outlined below Proceed with scheduling ERCP next week with attempt at fully covered self-expanding metal stent placement JP drain fluid studies to be obtained - Total protein - Albumin - LDH - Cell counts - Amylase - Lipase - Triglycerides - Fluid culture Hold on JP drain removal for now Continue to work on intake of protein shakes 2-3 times per day If patient is having progressive weight loss into next week then there is a chance he may need to be admitted after his ERCP for enteral  nutrition/TPN initiation Offered referral to [facility] or [place] or [place] but the patient/family defer on this currently with hope that he will continue to improve   Orders Placed This Encounter  Procedures   CBC   Comp Met (CMET)    New Prescriptions   CIPROFLOXACIN (CIPRO) 500 MG TABLET    Take 1 tablet (500 mg total) by mouth 2 (two) times daily.   Modified Medications   Modified Medication Previous Medication   OXYCODONE (OXY IR/ROXICODONE) 5 MG IMMEDIATE RELEASE TABLET oxyCODONE (OXY IR/ROXICODONE) 5 MG immediate release tablet      Take 1-2 tablets (5-10 mg total) by mouth every 4 (four) hours as needed for severe pain or breakthrough pain.    Take 1-2 tablets (5-10 mg total) by mouth every 4 (four) hours as needed for severe pain or breakthrough pain.    Planned Follow Up No follow-ups on file.   Total Time in Face-to-Face and in Coordination of Care for patient including independent/personal interpretation/review of prior testing, medical history, examination, medication adjustment, communicating results with the patient directly, and documentation with the EHR is 40 minutes.   Justice Britain, MD [facility] Office # [phone]    5 PM addendum Patient's laboratories were evaluated and he has evidence of jaundice  at this time.  Leukocytosis is the best it has been over the course of last 6 weeks.  Will initiate ciprofloxacin 500 mg twice daily as prophylaxis against cholangitis.  If the patient develops progressive fevers (greater than 100.3) or chills or worsening symptoms he will come into the hospital after calling to update [place].  He may require ERCP earlier in the next week if that is the case.  I spoke with the patient and patient's sister about this and they agree with this plan of action

## 2021-01-23 ENCOUNTER — Other Ambulatory Visit: Payer: Self-pay

## 2021-01-23 ENCOUNTER — Other Ambulatory Visit (HOSPITAL_COMMUNITY)
Admission: RE | Admit: 2021-01-23 | Discharge: 2021-01-23 | Disposition: A | Discharge status: Home / Self Care | Payer: Medicaid Other | Source: Ambulatory Visit | Attending: Gastroenterology | Admitting: Gastroenterology

## 2021-01-23 ENCOUNTER — Encounter (HOSPITAL_COMMUNITY): Payer: Self-pay | Admitting: Gastroenterology

## 2021-01-23 ENCOUNTER — Other Ambulatory Visit: Payer: Medicaid Other

## 2021-01-23 ENCOUNTER — Encounter: Payer: Medicaid Other | Admitting: General Surgery

## 2021-01-23 DIAGNOSIS — K859 Acute pancreatitis without necrosis or infection, unspecified: Secondary | ICD-10-CM

## 2021-01-23 DIAGNOSIS — K8689 Other specified diseases of pancreas: Secondary | ICD-10-CM

## 2021-01-23 NOTE — Addendum Note (Signed)
Addended by: Octavio Manns E on: 01/23/2021 01:15 PM   Modules accepted: Orders

## 2021-01-23 NOTE — Progress Notes (Signed)
Attempted to obtain medical history via telephone, unable to reach at this time. I left a voicemail to return pre surgical testing department's phone call.  

## 2021-01-24 LAB — CYTOLOGY - NON PAP

## 2021-01-25 DIAGNOSIS — K838 Other specified diseases of biliary tract: Secondary | ICD-10-CM | POA: Insufficient documentation

## 2021-01-25 DIAGNOSIS — R6881 Early satiety: Secondary | ICD-10-CM | POA: Insufficient documentation

## 2021-01-25 DIAGNOSIS — R17 Unspecified jaundice: Secondary | ICD-10-CM | POA: Insufficient documentation

## 2021-01-25 DIAGNOSIS — R748 Abnormal levels of other serum enzymes: Secondary | ICD-10-CM | POA: Insufficient documentation

## 2021-01-25 DIAGNOSIS — R932 Abnormal findings on diagnostic imaging of liver and biliary tract: Secondary | ICD-10-CM | POA: Insufficient documentation

## 2021-01-25 DIAGNOSIS — R634 Abnormal weight loss: Secondary | ICD-10-CM | POA: Insufficient documentation

## 2021-01-25 DIAGNOSIS — R188 Other ascites: Secondary | ICD-10-CM | POA: Insufficient documentation

## 2021-01-27 LAB — TIQ-MISC

## 2021-01-27 LAB — LD, PERITONEAL FLUID: LD, PERITONEAL FLUID: 61 U/L (ref <63)

## 2021-01-27 LAB — AEROBIC CULTURE

## 2021-01-27 LAB — PROTEIN, PERITONEAL FLUID: Protein, Peritoneal Fluid: <3 g/dL

## 2021-01-27 LAB — TRIGLYCERIDES, PERITONEAL FLUID: Triglycerides, Peritoneal Fluid: 54 mg/dL (ref <65)

## 2021-01-28 ENCOUNTER — Encounter: Payer: Medicaid Other | Admitting: General Surgery

## 2021-01-28 LAB — TRIGLYCERIDES: Triglycerides: 47 mg/dL (ref <150)

## 2021-01-28 NOTE — Anesthesia Preprocedure Evaluation (Addendum)
Anesthesia Evaluation  Patient identified by MRN, date of birth, ID band Patient awake    Reviewed: Allergy & Precautions, NPO status , Patient's Chart, lab work & pertinent test results  Airway Mallampati: I  TM Distance: >3 FB Neck ROM: Full    Dental no notable dental hx. (+) Edentulous Upper, Edentulous Lower   Pulmonary sleep apnea , COPD,  COPD inhaler, Current Smoker and Patient abstained from smoking.,    Pulmonary exam normal breath sounds clear to auscultation       Cardiovascular hypertension, Normal cardiovascular exam Rhythm:Regular Rate:Normal  10/25/20 TTE 1. Poor acoustic windows limit study.  2. Cannot fully evaluate regional wall motion as endocardium is difficult  to see.. Left ventricular ejection fraction, by estimation, is >75%. The  left ventricle has hyperdynamic function. There is mild left ventricular  hypertrophy. Left ventricular  diastolic parameters were normal.  3. Right ventricular systolic function is normal. The right ventricular  size is normal.  4. The mitral valve is normal in structure. Trivial mitral valve  regurgitation.  5. The aortic valve is abnormal. Aortic valve regurgitation is not  visualized. Mild aortic valve sclerosis is present, with no evidence of  aortic valve stenosis.  6. The inferior vena cava is normal in size with greater than 50%  respiratory variability, suggesting right atrial pressure of 3 mmHg.    Neuro/Psych  Neuromuscular disease    GI/Hepatic Neg liver ROS, Billiary obstruction   Endo/Other  negative endocrine ROS  Renal/GU      Musculoskeletal   Abdominal   Peds  Hematology   Anesthesia Other Findings All : Desipramine   Reproductive/Obstetrics                            Anesthesia Physical Anesthesia Plan  ASA: 3  Anesthesia Plan: General   Post-op Pain Management:    Induction: Intravenous  PONV Risk Score  and Plan: Treatment may vary due to age or medical condition, Midazolam, Ondansetron and Dexamethasone  Airway Management Planned: Oral ETT  Additional Equipment: None  Intra-op Plan:   Post-operative Plan: Extubation in OR  Informed Consent: I have reviewed the patients History and Physical, chart, labs and discussed the procedure including the risks, benefits and alternatives for the proposed anesthesia with the patient or authorized representative who has indicated his/her understanding and acceptance.     Dental advisory given  Plan Discussed with: CRNA and Anesthesiologist  Anesthesia Plan Comments:        Anesthesia Quick Evaluation

## 2021-01-29 ENCOUNTER — Encounter (HOSPITAL_COMMUNITY)
Admission: RE | Disposition: A | Discharge status: Home / Self Care | Payer: Self-pay | Source: Home / Self Care | Attending: Gastroenterology

## 2021-01-29 ENCOUNTER — Encounter (HOSPITAL_COMMUNITY): Payer: Self-pay | Admitting: Gastroenterology

## 2021-01-29 ENCOUNTER — Ambulatory Visit (HOSPITAL_COMMUNITY)
Admission: RE | Admit: 2021-01-29 | Discharge: 2021-01-29 | Disposition: A | Discharge status: Home / Self Care | Payer: Medicaid Other | Attending: Gastroenterology | Admitting: Gastroenterology

## 2021-01-29 ENCOUNTER — Ambulatory Visit (HOSPITAL_COMMUNITY): Payer: Medicaid Other | Admitting: Certified Registered Nurse Anesthetist

## 2021-01-29 ENCOUNTER — Other Ambulatory Visit: Payer: Self-pay

## 2021-01-29 ENCOUNTER — Ambulatory Visit (HOSPITAL_COMMUNITY): Payer: Medicaid Other

## 2021-01-29 DIAGNOSIS — K831 Obstruction of bile duct: Secondary | ICD-10-CM | POA: Diagnosis not present

## 2021-01-29 DIAGNOSIS — Z79899 Other long term (current) drug therapy: Secondary | ICD-10-CM | POA: Diagnosis not present

## 2021-01-29 DIAGNOSIS — R634 Abnormal weight loss: Secondary | ICD-10-CM | POA: Diagnosis not present

## 2021-01-29 DIAGNOSIS — R748 Abnormal levels of other serum enzymes: Secondary | ICD-10-CM | POA: Insufficient documentation

## 2021-01-29 DIAGNOSIS — K859 Acute pancreatitis without necrosis or infection, unspecified: Secondary | ICD-10-CM

## 2021-01-29 DIAGNOSIS — Z9049 Acquired absence of other specified parts of digestive tract: Secondary | ICD-10-CM | POA: Insufficient documentation

## 2021-01-29 DIAGNOSIS — I10 Essential (primary) hypertension: Secondary | ICD-10-CM | POA: Diagnosis not present

## 2021-01-29 DIAGNOSIS — K3189 Other diseases of stomach and duodenum: Secondary | ICD-10-CM | POA: Insufficient documentation

## 2021-01-29 DIAGNOSIS — R6881 Early satiety: Secondary | ICD-10-CM | POA: Diagnosis not present

## 2021-01-29 DIAGNOSIS — Z419 Encounter for procedure for purposes other than remedying health state, unspecified: Secondary | ICD-10-CM

## 2021-01-29 DIAGNOSIS — J449 Chronic obstructive pulmonary disease, unspecified: Secondary | ICD-10-CM | POA: Diagnosis not present

## 2021-01-29 DIAGNOSIS — Z6829 Body mass index (BMI) 29.0-29.9, adult: Secondary | ICD-10-CM | POA: Insufficient documentation

## 2021-01-29 DIAGNOSIS — F17210 Nicotine dependence, cigarettes, uncomplicated: Secondary | ICD-10-CM | POA: Diagnosis not present

## 2021-01-29 DIAGNOSIS — R17 Unspecified jaundice: Secondary | ICD-10-CM | POA: Insufficient documentation

## 2021-01-29 DIAGNOSIS — K839 Disease of biliary tract, unspecified: Secondary | ICD-10-CM

## 2021-01-29 HISTORY — PX: ENDOSCOPIC RETROGRADE CHOLANGIOPANCREATOGRAPHY (ERCP) WITH PROPOFOL: SHX5810

## 2021-01-29 HISTORY — PX: BILIARY STENT PLACEMENT: SHX5538

## 2021-01-29 LAB — COMPREHENSIVE METABOLIC PANEL
ALT: 64 U/L — ABNORMAL HIGH (ref 0–44)
AST: 55 U/L — ABNORMAL HIGH (ref 15–41)
Albumin: 2.7 g/dL — ABNORMAL LOW (ref 3.5–5.0)
Alkaline Phosphatase: 1692 U/L — ABNORMAL HIGH (ref 38–126)
Anion gap: 13 (ref 5–15)
BUN: 8 mg/dL (ref 6–20)
CO2: 27 mmol/L (ref 22–32)
Calcium: 8.8 mg/dL — ABNORMAL LOW (ref 8.9–10.3)
Chloride: 96 mmol/L — ABNORMAL LOW (ref 98–111)
Creatinine, Ser: 0.71 mg/dL (ref 0.61–1.24)
GFR, Estimated: >60 mL/min (ref >60)
Glucose, Bld: 88 mg/dL (ref 70–99)
Potassium: 5.5 mmol/L — ABNORMAL HIGH (ref 3.5–5.1)
Sodium: 136 mmol/L (ref 135–145)
Total Bilirubin: 2.2 mg/dL — ABNORMAL HIGH (ref 0.3–1.2)
Total Protein: 7 g/dL (ref 6.5–8.1)

## 2021-01-29 SURGERY — ENDOSCOPIC RETROGRADE CHOLANGIOPANCREATOGRAPHY (ERCP) WITH PROPOFOL
Anesthesia: General

## 2021-01-29 MED ORDER — FENTANYL CITRATE (PF) 100 MCG/2ML IJ SOLN
INTRAMUSCULAR | Status: DC | PRN
  Administered 2021-01-29 (×3): 50 ug via INTRAVENOUS

## 2021-01-29 MED ORDER — INDOMETHACIN 50 MG RE SUPP
RECTAL | Status: AC
  Filled 2021-01-29: qty 2

## 2021-01-29 MED ORDER — DEXAMETHASONE SODIUM PHOSPHATE 10 MG/ML IJ SOLN
INTRAMUSCULAR | Status: DC | PRN
  Administered 2021-01-29: 4 mg via INTRAVENOUS

## 2021-01-29 MED ORDER — FENTANYL CITRATE (PF) 100 MCG/2ML IJ SOLN
INTRAMUSCULAR | Status: AC
  Filled 2021-01-29: qty 2

## 2021-01-29 MED ORDER — INDOMETHACIN 50 MG RE SUPP
RECTAL | Status: DC | PRN
  Administered 2021-01-29: 100 mg via RECTAL

## 2021-01-29 MED ORDER — MIDAZOLAM HCL 5 MG/5ML IJ SOLN
INTRAMUSCULAR | Status: DC | PRN
  Administered 2021-01-29 (×2): 1 mg via INTRAVENOUS

## 2021-01-29 MED ORDER — LACTATED RINGERS IV SOLN: INTRAVENOUS | Status: DC | PRN

## 2021-01-29 MED ORDER — SODIUM CHLORIDE 0.9 % IV SOLN: INTRAVENOUS | Status: DC

## 2021-01-29 MED ORDER — CIPROFLOXACIN IN D5W 400 MG/200ML IV SOLN
INTRAVENOUS | Status: DC | PRN
  Administered 2021-01-29: 400 mg via INTRAVENOUS

## 2021-01-29 MED ORDER — PHENYLEPHRINE 40 MCG/ML (10ML) SYRINGE FOR IV PUSH (FOR BLOOD PRESSURE SUPPORT)
PREFILLED_SYRINGE | INTRAVENOUS | Status: DC | PRN
  Administered 2021-01-29: 80 ug via INTRAVENOUS

## 2021-01-29 MED ORDER — ROCURONIUM BROMIDE 10 MG/ML (PF) SYRINGE
PREFILLED_SYRINGE | INTRAVENOUS | Status: DC | PRN
  Administered 2021-01-29: 60 mg via INTRAVENOUS

## 2021-01-29 MED ORDER — ONDANSETRON HCL 4 MG/2ML IJ SOLN
INTRAMUSCULAR | Status: DC | PRN
  Administered 2021-01-29: 4 mg via INTRAVENOUS

## 2021-01-29 MED ORDER — PROPOFOL 10 MG/ML IV BOLUS
INTRAVENOUS | Status: DC | PRN
  Administered 2021-01-29: 170 mg via INTRAVENOUS

## 2021-01-29 MED ORDER — SODIUM CHLORIDE 0.9 % IV SOLN
INTRAVENOUS | Status: DC | PRN
  Administered 2021-01-29: 30 mL

## 2021-01-29 MED ORDER — SUGAMMADEX SODIUM 200 MG/2ML IV SOLN
INTRAVENOUS | Status: DC | PRN
  Administered 2021-01-29: 200 mg via INTRAVENOUS

## 2021-01-29 MED ORDER — PROPOFOL 10 MG/ML IV BOLUS
INTRAVENOUS | Status: AC
  Filled 2021-01-29: qty 20

## 2021-01-29 MED ORDER — GLUCAGON HCL RDNA (DIAGNOSTIC) 1 MG IJ SOLR
INTRAMUSCULAR | Status: DC | PRN
  Administered 2021-01-29 (×3): .25 mg via INTRAVENOUS

## 2021-01-29 MED ORDER — CIPROFLOXACIN IN D5W 400 MG/200ML IV SOLN
INTRAVENOUS | Status: AC
  Filled 2021-01-29: qty 200

## 2021-01-29 MED ORDER — GLUCAGON HCL RDNA (DIAGNOSTIC) 1 MG IJ SOLR
INTRAMUSCULAR | Status: AC
  Filled 2021-01-29: qty 1

## 2021-01-29 MED ORDER — FUROSEMIDE 20 MG PO TABS: 20.0000 mg | ORAL_TABLET | Freq: Every day | ORAL | 12 refills | Status: DC

## 2021-01-29 MED ORDER — MIDAZOLAM HCL 2 MG/2ML IJ SOLN
INTRAMUSCULAR | Status: AC
  Filled 2021-01-29: qty 2

## 2021-01-29 MED ORDER — LIDOCAINE 2% (20 MG/ML) 5 ML SYRINGE
INTRAMUSCULAR | Status: DC | PRN
  Administered 2021-01-29: 80 mg via INTRAVENOUS

## 2021-01-29 NOTE — Anesthesia Postprocedure Evaluation (Signed)
Anesthesia Post Note  Patient: Steve Romero  Procedure(s) Performed: ENDOSCOPIC RETROGRADE CHOLANGIOPANCREATOGRAPHY (ERCP) WITH PROPOFOL BILIARY STENT PLACEMENT     Patient location during evaluation: Endoscopy Anesthesia Type: General Level of consciousness: awake and alert Pain management: pain level controlled Vital Signs Assessment: post-procedure vital signs reviewed and stable Respiratory status: spontaneous breathing, nonlabored ventilation, respiratory function stable and patient connected to nasal cannula oxygen Cardiovascular status: blood pressure returned to baseline and stable Postop Assessment: no apparent nausea or vomiting Anesthetic complications: no   No notable events documented.  Last Vitals:  Vitals:   01/29/21 1324 01/29/21 1326  BP: (!) 156/95   Pulse: 73 73  Resp: 20 (!) 21  Temp: 36.6 C   SpO2: 100% 100%    Last Pain:  Vitals:   01/29/21 1324  TempSrc: Oral  PainSc: 0-No pain                 Barnet [place]

## 2021-01-29 NOTE — Discharge Instructions (Signed)
YOU HAD AN ENDOSCOPIC PROCEDURE TODAY: Refer to the procedure report and other information in the discharge instructions given to you for any specific questions about what was found during the examination. If this information does not answer your questions, please call [place] office at [phone] to clarify.   YOU SHOULD EXPECT: Some feelings of bloating in the abdomen. Passage of more gas than usual. Walking can help get rid of the air that was put into your GI tract during the procedure and reduce the bloating. If you had a lower endoscopy (such as a colonoscopy or flexible sigmoidoscopy) you may notice spotting of blood in your stool or on the toilet paper. Some abdominal soreness may be present for a day or two, also.  DIET: Your first meal following the procedure should be a light meal and then it is ok to progress to your normal diet. A half-sandwich or bowl of soup is an example of a good first meal. Heavy or fried foods are harder to digest and may make you feel nauseous or bloated. Drink plenty of fluids but you should avoid alcoholic beverages for 24 hours. If you had a esophageal dilation, please see attached instructions for diet.    ACTIVITY: Your care partner should take you home directly after the procedure. You should plan to take it easy, moving slowly for the rest of the day. You can resume normal activity the day after the procedure however YOU SHOULD NOT DRIVE, use power tools, machinery or perform tasks that involve climbing or major physical exertion for 24 hours (because of the sedation medicines used during the test).   SYMPTOMS TO REPORT IMMEDIATELY: A gastroenterologist can be reached at any hour. Please call [phone]  for any of the following symptoms:   Following upper endoscopy (EGD, EUS, ERCP, esophageal dilation) Vomiting of blood or coffee ground material  New, significant abdominal pain  New, significant chest pain or pain under the shoulder blades  Painful or  persistently difficult swallowing  New shortness of breath  Black, tarry-looking or red, bloody stools  FOLLOW UP:  If any biopsies were taken you will be contacted by phone or by letter within the next 1-3 weeks. Call [phone]  if you have not heard about the biopsies in 3 weeks.  Please also call with any specific questions about appointments or follow up tests.  

## 2021-01-29 NOTE — Interval H&P Note (Signed)
History and Physical Interval Note:  01/29/2021 11:39 AM  Steve Romero  has presented today for surgery, with the diagnosis of biliary obstruction- stent placement.  The various methods of treatment have been discussed with the patient and family. After consideration of risks, benefits and other options for treatment, the patient has consented to  Procedure(s): ENDOSCOPIC RETROGRADE CHOLANGIOPANCREATOGRAPHY (ERCP) WITH PROPOFOL (N/A) as a surgical intervention.  The patient's history has been reviewed, patient examined, no change in status, stable for surgery.  I have reviewed the patient's chart and labs.  Questions were answered to the patient's satisfaction.    The risks of an ERCP were discussed at length, including but not limited to the risk of perforation, bleeding, abdominal pain, post-ERCP pancreatitis (while usually mild can be severe and even life threatening).    [employer]

## 2021-01-29 NOTE — Transfer of Care (Signed)
Immediate Anesthesia Transfer of Care Note  Patient: Steve Romero  Procedure(s) Performed: ENDOSCOPIC RETROGRADE CHOLANGIOPANCREATOGRAPHY (ERCP) WITH PROPOFOL BILIARY STENT PLACEMENT  Patient Location: Endoscopy Unit  Anesthesia Type:General  Level of Consciousness: awake, drowsy and patient cooperative  Airway & Oxygen Therapy: Patient Spontanous Breathing and Patient connected to face mask oxygen  Post-op Assessment: Report given to RN and Post -op Vital signs reviewed and stable  Post vital signs: Reviewed and stable  Last Vitals:  Vitals Value Taken Time  BP 156/95 01/29/21 1322  Temp    Pulse 74 01/29/21 1324  Resp 19 01/29/21 1324  SpO2 100 % 01/29/21 1324  Vitals shown include unvalidated device data.  Last Pain:  Vitals:   01/29/21 1011  PainSc: 0-No pain         Complications: No notable events documented.

## 2021-01-29 NOTE — Anesthesia Procedure Notes (Signed)
Procedure Name: Intubation Date/Time: 01/29/2021 12:12 PM Performed by: West Pugh, CRNA Pre-anesthesia Checklist: Patient identified, Emergency Drugs available, Suction available, Patient being monitored and Timeout performed Patient Re-evaluated:Patient Re-evaluated prior to induction Oxygen Delivery Method: Circle system utilized Preoxygenation: Pre-oxygenation with 100% oxygen Induction Type: IV induction Ventilation: Mask ventilation without difficulty Laryngoscope Size: Mac and 4 Grade View: Grade I Tube type: Oral Tube size: 7.5 mm Number of attempts: 1 Airway Equipment and Method: Stylet Placement Confirmation: ETT inserted through vocal cords under direct vision, positive ETCO2, CO2 detector and breath sounds checked- equal and bilateral Secured at: 22 cm Tube secured with: Tape Dental Injury: Teeth and Oropharynx as per pre-operative assessment

## 2021-01-29 NOTE — Op Note (Signed)
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 Bridges, [place] L. Westly Pam M.                            Hetty Ely, MD Medicines:                General Anesthesia, Cipro 400 mg IV, Indomethacin                            100 mg PR, Glucagon 5.62 mg IV Complications:            No immediate complications. Estimated Blood Loss:     Estimated blood loss was minimal. Procedure:                Pre-Anesthesia Assessment:                           - Prior to the procedure, a History and Physical                            was performed, and patient medications and                            allergies were reviewed. The patient's tolerance of                            previous anesthesia was also reviewed. The risks                            and benefits of the procedure and the sedation  options and risks were discussed with the patient.                            All questions were answered, and informed consent                            was obtained. Prior Anticoagulants: The patient has                            taken no previous anticoagulant or antiplatelet                            agents. ASA Grade Assessment:  III - A patient with                            severe systemic disease. After reviewing the risks                            and benefits, the patient was deemed in                            satisfactory condition to undergo the procedure.                           After obtaining informed consent, the scope was                            passed under direct vision. Throughout the                            procedure, the patient's blood pressure, pulse, and                            oxygen saturations were monitored continuously. The                            TJF-Q180V ([id number]) Olympus Duodenoscope was                            introduced through the mouth, and used to inject                            contrast into and used to inject contrast into the                            bile duct. The ERCP was technically difficult and                            complex due to challenging cannulation. Successful                            completion of the procedure was aided by performing  the maneuvers documented (below) in this report.                            The patient tolerated the procedure. Scope In: Scope Out: Findings:      A scout film of the abdomen was obtained. One percutaneous drain ending       in the Right upper quadrant was seen. Surgical clips, consistent with       previous cholecystectomy, were seen in the area of the right upper       quadrant of the abdomen.      The upper GI tract was traversed under direct vision without detailed       examination. Diffuse moderately erythematous mucosa without bleeding was       found in the entire examined stomach - not clearly defined as portal       hypertensive gastropathy but beginning to look like a possibility.       Diffuse severely congested mucosa without active bleeding and with no       stigmata of bleeding was found in the entire duodenum. A biliary       sphincterotomy had been  performed. The sphincterotomy appeared open. The       major papilla was edematous.      The bile duct could not be cannulated with the Hydratome sphincterotome       and 0.035 Jagwire. The wire kept moving into a position (though I was       seated in the sphincterotomy orientation) towards the spine concerning       if this was within the pancreatic orifice (though I was much higher than       would be anticipated). I grew concerned about the possiblity of the       strictured area havign caused significant distortion distally. I       transitioned over to a short 0.025 inch Revolution Angled Jagwire and       with some maneuvering, I was able to pass this deep into the biliary       tree. The Hydratome sphincterotome was passed over the guidewire and the       bile duct was then deeply cannulated. Contrast was injected. I       personally interpreted the bile duct images. Ductal flow of contrast was       adequate. Image quality was adequate. Contrast extended to the       bifurcation. Opacification of the entire biliary tree except for the       cystic duct and gallbladder was successful. The lower third of the main       bile duct contained a single severe stenosis 35 mm in length. The middle       third of the main bile duct and upper third of the main bile duct were       severely dilated, secondary to aforementioned stricture. The largest       diameter was 20 mm. One [place] 10 mm by 6 cm fully covered metal biliary       stent was placed into the common bile duct. Bile flowed through the       stent. The stent was in good position.      A pancreatogram was not performed.      The duodenoscope was withdrawn from the patient. Impression:               -  Erythematous mucosa in the stomach - previously                            biopsied.                           - Congested duodenal mucosa.                           - Prior biliary sphincterotomy appeared open.                            - The major papilla appeared edematous.                           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                           - One covered metal biliary stent was placed into                            the common bile duct. Moderate Sedation:      Not Applicable - Patient had care per Anesthesia. Recommendation:           - The patient will be observed post-procedure,                            until all discharge criteria are met.                           - Discharge patient to home.                           - Patient has a contact number available for                            emergencies. The signs and symptoms of potential                            delayed complications were discussed with the                            patient. Return to normal activities tomorrow.                            Written discharge instructions were provided to the                            patient.                           - Advance diet as tolerated.                           -  Observe patient's clinical course.                           - Check liver enzymes (AST, ALT, alkaline                            phosphatase, bilirubin) in 1 week.                           - If weight loss is persisting then will need to                            consider TPN.                           - Watch for pancreatitis, bleeding, perforation,                            and cholangitis.                           - Recommend removal of percutaneous drain in                            interim with pressure dressing to be placed in                            effort of having the area close. Will need to                            consider adjustment of medications for likely                            ascites therapy.                           - The findings and recommendations were discussed                            with the patient.                            - The findings and recommendations were discussed                            with the patient's family. Procedure Code(s):        --- Professional ---                           [phone], Endoscopic retrograde                            cholangiopancreatography (ERCP); with placement of                            endoscopic stent into biliary or pancreatic duct,  including pre- and post-dilation and guide wire                            passage, when performed, including sphincterotomy,                            when performed, each stent Diagnosis Code(s):        --- Professional ---                           K31.89, Other diseases of stomach and duodenum                           K83.8, Other specified diseases of biliary tract                           K83.1, Obstruction of bile duct                           R17, Unspecified jaundice                           R74.8, Abnormal levels of other serum enzymes                           K85.90, Acute pancreatitis without necrosis or                            infection, unspecified                           K85.80, Other acute pancreatitis without necrosis                            or infection                           K91.89, Other postprocedural complications and                            disorders of digestive system CPT copyright 2019 American Medical Association. All rights reserved. The codes documented in this report are preliminary and upon coder review may  be revised to meet current compliance requirements. Justice Britain, MD 01/29/2021 1:44:46 PM Number of Addenda: 0

## 2021-01-30 ENCOUNTER — Telehealth: Payer: Self-pay

## 2021-01-30 ENCOUNTER — Encounter (HOSPITAL_COMMUNITY): Payer: Self-pay | Admitting: Gastroenterology

## 2021-01-30 DIAGNOSIS — K8689 Other specified diseases of pancreas: Secondary | ICD-10-CM

## 2021-01-30 LAB — LIPASE, JP DRAINAGE: Lipase, JP Drainage: 7 U/L (ref <10)

## 2021-01-30 LAB — AMYLASE, PERITONEAL FLUID: Amylase, peritoneal fluid: <10 U/L

## 2021-01-30 NOTE — Telephone Encounter (Signed)
Lab order entered will call pt Wed.  Message sent to call pt

## 2021-01-30 NOTE — Telephone Encounter (Signed)
-----   Message from Virl Cagey, MD sent at 01/29/2021  6:48 PM EDT ----- Regarding: RE: Follow-up Saw ERCP note about inflammatory stricture, stent, getting drain out. Did you ever get the drain studies / you think it is just ascites too, I saw biliary tree did not show a leak.  Did you all take drain out today or does it still need to be removed. I can get it out tomorrow if I can get a hold of him.   Ria Comment    ----- Message ----- From: Irving Copas., MD Sent: 01/29/2021   2:42 PM EDT To: Dwan Bolt, MD, Timothy Lasso, RN, # Subject: Follow-up                                      Bryann Mcnealy, Please call the patient/patient's sister next Wednesday. Lets see how his weight has done. If he has had greater than another 3 to 4 pounds of weight loss, then likely we will need to work on setting up a direct admission for enteral nutrition/TPN initiation. LFTs in 1 week as well unless he is admitted. Thanks. GM

## 2021-01-31 ENCOUNTER — Other Ambulatory Visit: Payer: Self-pay

## 2021-01-31 ENCOUNTER — Ambulatory Visit (INDEPENDENT_AMBULATORY_CARE_PROVIDER_SITE_OTHER): Payer: Medicaid Other | Admitting: Family Medicine

## 2021-01-31 ENCOUNTER — Encounter: Payer: Self-pay | Admitting: Family Medicine

## 2021-01-31 VITALS — BP 138/89 | HR 72 | Temp 97.2°F | Ht 69.0 in | Wt 201.0 lb

## 2021-01-31 DIAGNOSIS — B351 Tinea unguium: Secondary | ICD-10-CM

## 2021-01-31 DIAGNOSIS — K8689 Other specified diseases of pancreas: Secondary | ICD-10-CM | POA: Diagnosis not present

## 2021-01-31 LAB — ALBUMIN, PERITONEAL FLUID: ALBUMIN, PERITONEAL FLUID: 0.8 g/dL

## 2021-01-31 MED ORDER — OXYCODONE HCL 5 MG PO TABS: 5.0000 mg | ORAL_TABLET | ORAL | 0 refills | Status: DC | PRN

## 2021-01-31 NOTE — Progress Notes (Signed)
Subjective: CC: Follow-up GI issues PCP: [name], DO [id number] Steve Romero is a 50 y.o. male presenting to clinic today for:  Patient with complications after ERCP. unfortunate developed pancreatic necrosis and has been very closely followed up with gastroenterology, infectious disease and general surgery since that time.  He is currently being treated with as needed pain medication, oral antibiotics and nausea medicine.  Overall symptoms have been relatively stable.  He is trying to increase appetite and fluid intake.  He has been monitoring his weight fairly closely and was told that if his weight were to drop below 196 they would need to consider admission for TPN.  Bowel movements have been normal.  No blood in stool.  Onychomycosis noted along the right great toe.  He is wanting to know if there is anything that he can do because its been somewhat painful around the toenail.   ROS: Per HPI  Allergies  Allergen Reactions   Desipramine Nausea Only and Rash   Past Medical History:  Diagnosis Date   Anxiety 10-2014   Bipolar disorder [facility]) age 83   COPD (chronic obstructive pulmonary disease) ([place]) 2016   Depression age 41   Enlarged prostate    Hyperlipidemia 2016   Hypertension 2013   Schizoaffective disorder ([place]) 11-13-14   Sleep apnea    Thyroid disease 11-2014    Current Outpatient Medications:    albuterol (VENTOLIN HFA) 108 (90 Base) MCG/ACT inhaler, Inhale 2 puffs into the lungs every 6 (six) hours as needed for wheezing or shortness of breath., Disp: 8 g, Rfl: 0   ciprofloxacin (CIPRO) 500 MG tablet, Take 1 tablet (500 mg total) by mouth 2 (two) times daily., Disp: 20 tablet, Rfl: 0   furosemide (LASIX) 20 MG tablet, Take 1 tablet (20 mg total) by mouth daily., Disp: 30 tablet, Rfl: 12   oxyCODONE (OXY IR/ROXICODONE) 5 MG immediate release tablet, Take 1-2 tablets (5-10 mg total) by mouth every 4 (four) hours as needed for severe pain or breakthrough pain.,  Disp: 30 tablet, Rfl: 0 Social History   Socioeconomic History   Marital status: Single    Spouse name: Not on file   Number of children: Not on file   Years of education: Not on file   Highest education level: Not on file  Occupational History   Not on file  Tobacco Use   Smoking status: Every Day    Packs/day: 0.25    Years: 26.00    Pack years: 6.50    Types: Cigarettes   Smokeless tobacco: Never   Tobacco comments:    4-5 cig daily as of 10/07/2020.  Vaping Use   Vaping Use: Never used  Substance and Sexual Activity   Alcohol use: No   Drug use: No   Sexual activity: Never    Birth control/protection: None  Other Topics Concern   Not on file  Social History Narrative   Not on file   Social Determinants of Health   Financial Resource Strain: Not on file  Food Insecurity: Not on file  Transportation Needs: Not on file  Physical Activity: Not on file  Stress: Not on file  Social Connections: Not on file  Intimate Partner Violence: Not on file   Family History  Problem Relation Age of Onset   Diabetes Father    Heart disease Father    Heart disease Maternal Grandmother    Stroke Maternal Grandfather    Colon cancer Neg Hx    Esophageal cancer  Neg Hx    Inflammatory bowel disease Neg Hx    Liver disease Neg Hx    Pancreatic cancer Neg Hx    Rectal cancer Neg Hx    Stomach cancer Neg Hx     Objective: Office vital signs reviewed. BP 138/89   Pulse 72   Temp (!) 97.2 F (36.2 C)   Ht '5\' 9"'$  (1.753 m)   Wt 201 lb (91.2 kg)   SpO2 99%   BMI 29.68 kg/m   Physical Examination:  General: Awake, alert, ill-appearing patient, No acute distress HEENT: Sclera white. Cardio: regular rate and rhythm, S1S2 heard, no murmurs appreciated Pulm: clear to auscultation bilaterally, no wheezes, rhonchi or rales; normal work of breathing on room air GI: Has drain in place.  Yellow, purulent fluid draining. Skin: Onychomycotic right great toenail present.  He does have  a little inflammation around the entire cuticle.  Nothing that appears to be paronychia  Assessment/ Plan: 50 y.o. male   Pancreatic necrosis - Plan: oxyCODONE (OXY IR/ROXICODONE) 5 MG immediate release tablet  Onychomycosis of great toe - Plan: Ambulatory referral to Podiatry  Continues to follow-up very closely with GI, infectious disease.  I have renewed his pain medications.  The national narcotic database was reviewed and there were no red flags.  He is welcome to follow-up with me if he needs refills on this going forward, otherwise will defer to GI to continue refilling if needed.  I think at this point is try to minimize appointment since he is being seen so frequently  He seems to be up a couple of pounds which is hopeful.  I am very hesitant to utilize any oral medications for the onychomycosis noted in the right great toe given fluctuations in liver function test and complicated GI issues currently.  I am going to refer him to podiatry for consideration of cutting this nail down and or totally removing it.   No orders of the defined types were placed in this encounter.  No orders of the defined types were placed in this encounter.    [name], DO [place] [phone]

## 2021-02-03 LAB — TOTAL BILIRUBIN, BODY FLUID: Total bilirubin, fluid: 0.4 mg/dL

## 2021-02-05 ENCOUNTER — Telehealth: Payer: Self-pay

## 2021-02-05 DIAGNOSIS — K8689 Other specified diseases of pancreas: Secondary | ICD-10-CM

## 2021-02-05 NOTE — Telephone Encounter (Signed)
-----   Message from Timothy Lasso, RN sent at 01/29/2021  4:01 PM EDT ----- Regarding: FW: Follow-up  ----- Message ----- From: Irving Copas., MD Sent: 01/29/2021   2:42 PM EDT To: Dwan Bolt, MD, Timothy Lasso, RN, # Subject: Follow-up                                      Steve Romero, Please call the patient/patient's sister next Wednesday. Lets see how his weight has done. If he has had greater than another 3 to 4 pounds of weight loss, then likely we will need to work on setting up a direct admission for enteral nutrition/TPN initiation. LFTs in 1 week as well unless he is admitted. Thanks. GM

## 2021-02-05 NOTE — Telephone Encounter (Signed)
I spoke with the pt sister and she tells me that the pt has gained 2 pounds since last week.  He is up to 201 lbs. Per the chart his last weight on 7/29 was 201 lbs.  He will come in for labs this week. The order has been entered.

## 2021-02-12 ENCOUNTER — Ambulatory Visit: Payer: Medicaid Other | Admitting: Podiatry

## 2021-02-12 ENCOUNTER — Encounter: Payer: Self-pay | Admitting: Podiatry

## 2021-02-12 ENCOUNTER — Other Ambulatory Visit: Payer: Self-pay

## 2021-02-12 ENCOUNTER — Other Ambulatory Visit: Payer: Medicaid Other

## 2021-02-12 DIAGNOSIS — L600 Ingrowing nail: Secondary | ICD-10-CM | POA: Diagnosis not present

## 2021-02-12 DIAGNOSIS — M79674 Pain in right toe(s): Secondary | ICD-10-CM | POA: Diagnosis not present

## 2021-02-12 DIAGNOSIS — B351 Tinea unguium: Secondary | ICD-10-CM | POA: Diagnosis not present

## 2021-02-12 DIAGNOSIS — M79675 Pain in left toe(s): Secondary | ICD-10-CM | POA: Diagnosis not present

## 2021-02-12 DIAGNOSIS — K8689 Other specified diseases of pancreas: Secondary | ICD-10-CM

## 2021-02-12 NOTE — Addendum Note (Signed)
Addended by: Liliane Bade on: 02/12/2021 01:12 PM   Modules accepted: Orders

## 2021-02-12 NOTE — Patient Instructions (Signed)

## 2021-02-13 LAB — HEPATIC FUNCTION PANEL
ALT: 9 IU/L (ref 0–44)
AST: 11 IU/L (ref 0–40)
Albumin: 3 g/dL — ABNORMAL LOW (ref 4.0–5.0)
Alkaline Phosphatase: 359 IU/L — ABNORMAL HIGH (ref 44–121)
Bilirubin Total: 0.4 mg/dL (ref 0.0–1.2)
Bilirubin, Direct: 0.26 mg/dL (ref 0.00–0.40)
Total Protein: 6.1 g/dL (ref 6.0–8.5)

## 2021-02-13 NOTE — Progress Notes (Signed)
Subjective:   Patient ID: Steve Romero, male   DOB: 50 y.o.   MRN: [id number]   HPI Patient presents with very painful ingrown toenail of the right big toe stating that its been hurting him for at least 6 months and gradually getting worse.  Patient also has elongated nailbeds 1-5 both feet that become tender and make it hard to wear shoe gear.  This is been going on now for at least 6 months and he is tried to trim it and soak it and he does smoke quarter pack per day and tries to stay active   Review of Systems  All other systems reviewed and are negative.      Objective:  Physical Exam Vitals and nursing note reviewed.  Constitutional:      Appearance: He is well-developed.  Pulmonary:     Effort: Pulmonary effort is normal.  Musculoskeletal:        General: Normal range of motion.  Skin:    General: Skin is warm.  Neurological:     Mental Status: He is alert.    Neurovascular status intact muscle strength was found to be adequate range of motion was found to be adequate.  Patient is found to have an incurvated lateral border right hallux that is painful when pressed make shoe gear difficult and does have very much thickening of the nailbed itself.  All nails have degree of incurvation and them on are painful when pressed with patient noted to have good digital perfusion well oriented x3     Assessment:  Severe thickened damaged right hallux nail painful and remaining nails that are bothersome for the patient     Plan:  H&P reviewed both conditions and I have recommended removal of the nailbed explaining permanent procedure given to his long-term nature.  Patient wants surgery understanding risk and signed consent form and I went ahead and infiltrated the right hallux 60 mg like Marcaine mixture sterile prep applied to the toe and using sterile instrumentation remove the hallux nail exposed matrix applied phenol 5 applications 30 seconds followed by alcohol lavage and sterile  dressing.  I debrided remaining nailbeds that are all thickened and incurvated in the corners and painful when pressed with no iatrogenic bleeding and patient will be seen back and is encouraged to call with questions with patient to leave dressing on 24 hours but take it off earlier if any throbbing were to occur

## 2021-02-14 ENCOUNTER — Telehealth: Payer: Self-pay | Admitting: Gastroenterology

## 2021-02-14 NOTE — Telephone Encounter (Signed)
See alternate note dated 8/12

## 2021-02-17 ENCOUNTER — Telehealth: Payer: Self-pay | Admitting: Gastroenterology

## 2021-02-17 NOTE — Telephone Encounter (Signed)
The pt has been rescheduled to 9/7 at 230 pm with GM.  The pt sister has been advised.

## 2021-02-17 NOTE — Telephone Encounter (Signed)
Pt's sister Jacqlyn Larsen called requesting a sooner appt for pt than 9/28. She stated that Dr. Rush Landmark wanted to see pt in 3 weeks. Pls call her.

## 2021-02-19 ENCOUNTER — Ambulatory Visit: Payer: Medicaid Other | Admitting: Gastroenterology

## 2021-02-19 ENCOUNTER — Telehealth: Payer: Self-pay | Admitting: Gastroenterology

## 2021-02-19 NOTE — Telephone Encounter (Signed)
Absolutely fine to have those collected here.  Please place orders to be collected through [facility] (which is who we use here) and he can have done first thing in am.  Does NOT need an appt to have those collected.  If you place as STAT and he comes before 11am, the labs should result before the end of the business day tomorrow.

## 2021-02-19 NOTE — Telephone Encounter (Signed)
Yesterday had "yellow" diarrhea every 15 min. and "yellow" vomiting.  He took imodium that stopped the diarrhea.  He does have complaints of abd pain above the belly button.  He is unable to eat.  He has refused pain medication.  His drain is leaking down his abd and not going into the bottle.

## 2021-02-19 NOTE — Telephone Encounter (Signed)
I tried to call and speak with the patient but he did not answer.  I spoke with the patient's sister.  She relates that over the last 3 days the patient has had issues with diarrhea as well as nausea and vomiting.  He took Imodium yesterday and has had cessation of his diarrhea.  However abdominal pain that has been more of a chronic issue has now exacerbated and worsened per her report.  He is not taking his pain medication however.  Patient is also having increased amount of fluid drainage, ascites drainage, from his JP drain site.  It is possible that he is having further fluid accumulation that may require further diuretic management/titration.  With that being said, we do not have updated labs and will need to have that.  With the progressive abdominal pain, and patient's sister stating that he is worsening with his pain he needs updated imaging or potentially admission to the hospital. We are going to see if we can have the patient undergo laboratories at his PCP office in [facility] tomorrow (RN Gerarda Fraction, please see if we can do the following labs-CBC/ESR/CRP/CMP). If he begins to have significant stool output he has been on/off antibiotics over the course of the last few months and should be evaluated for C. difficile as well as typical stool culture and ova/parasite (RN Gerarda Fraction when you talk with the patient's sister you can see if he is still having symptoms of diarrhea which point you can add stool studies). 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 The patient's sister and mother know that if the patient is having progressive issues that patient come into the hospital, I recommend [facility] or [facility]. I will forward this to the  patient's care team as well.  GM

## 2021-02-19 NOTE — Telephone Encounter (Signed)
Inbound call from patient sister. States patient had diarrhea every 15 minutes that was yellow yesterday 8/16. He did have imodium and it helped stop. Patient also have been vomiting that was just yellow on Monday 02/17/21. Also having lots of abd pain. Best contact number [phone]

## 2021-02-20 ENCOUNTER — Other Ambulatory Visit: Payer: Medicaid Other

## 2021-02-20 ENCOUNTER — Other Ambulatory Visit: Payer: Self-pay

## 2021-02-20 DIAGNOSIS — R109 Unspecified abdominal pain: Secondary | ICD-10-CM

## 2021-02-20 DIAGNOSIS — K8689 Other specified diseases of pancreas: Secondary | ICD-10-CM

## 2021-02-20 DIAGNOSIS — R188 Other ascites: Secondary | ICD-10-CM

## 2021-02-20 NOTE — Telephone Encounter (Signed)
The pt sister returned call.  She has been advised that the pt should get labs today at PCP office.  She was also advised of the CT scan on 8/22 at the [facility] location.  She will pick up contrast tomorrow.  She did state that the pt does not have diarrhea currently.

## 2021-02-20 NOTE — Telephone Encounter (Signed)
The pt has been scheduled for CT on 8/22 at 8 am at the [facility] location.  He will need to pick up contrast and have labs at his PCP office.   Left message on machine to call back

## 2021-02-20 NOTE — Telephone Encounter (Signed)
AMG, Thanks for the quick reply and offer.  Steve Romero, please put in the laboratories as outlined CBC/ESR/CRP/CMP under [facility] and put them under stat so that he can come over to his PCP office to have the laboratories completed today. Please try to set up the CT abdomen/pelvis with IV contrast as soon as possible. If after you talk with the patient's sister Jacqlyn Larsen) and issues have worsened with the patient overnight then he may end up needing to come into the hospital.   Thanks.  GM

## 2021-02-20 NOTE — Telephone Encounter (Signed)
Came in after lunch so probably won't be back until tomorrow.  Luckily, I saw him in the lab and reinforced red flags and indication to seek care in ER.

## 2021-02-20 NOTE — Telephone Encounter (Signed)
Thanks for helping arrange this. Hopefully will see the lab tests later today or early tomorrow. GM

## 2021-02-21 LAB — COMPREHENSIVE METABOLIC PANEL
ALT: 13 IU/L (ref 0–44)
AST: 12 IU/L (ref 0–40)
Albumin/Globulin Ratio: 1.1 — ABNORMAL LOW (ref 1.2–2.2)
Albumin: 3.4 g/dL — ABNORMAL LOW (ref 4.0–5.0)
Alkaline Phosphatase: 277 IU/L — ABNORMAL HIGH (ref 44–121)
BUN/Creatinine Ratio: 10 (ref 9–20)
BUN: 7 mg/dL (ref 6–24)
Bilirubin Total: 0.3 mg/dL (ref 0.0–1.2)
CO2: 27 mmol/L (ref 20–29)
Calcium: 8.9 mg/dL (ref 8.7–10.2)
Chloride: 101 mmol/L (ref 96–106)
Creatinine, Ser: 0.67 mg/dL — ABNORMAL LOW (ref 0.76–1.27)
Globulin, Total: 3 g/dL (ref 1.5–4.5)
Glucose: 93 mg/dL (ref 65–99)
Potassium: 3.9 mmol/L (ref 3.5–5.2)
Sodium: 142 mmol/L (ref 134–144)
Total Protein: 6.4 g/dL (ref 6.0–8.5)
eGFR: 114 mL/min/{1.73_m2} (ref >59)

## 2021-02-21 LAB — CBC WITH DIFFERENTIAL/PLATELET
Basophils Absolute: 0.1 10*3/uL (ref 0.0–0.2)
Basos: 1 %
EOS (ABSOLUTE): 0.2 10*3/uL (ref 0.0–0.4)
Eos: 2 %
Hematocrit: 42.4 % (ref 37.5–51.0)
Hemoglobin: 14 g/dL (ref 13.0–17.7)
Immature Grans (Abs): 0 10*3/uL (ref 0.0–0.1)
Immature Granulocytes: 0 %
Lymphocytes Absolute: 1.6 10*3/uL (ref 0.7–3.1)
Lymphs: 19 %
MCH: 29.2 pg (ref 26.6–33.0)
MCHC: 33 g/dL (ref 31.5–35.7)
MCV: 88 fL (ref 79–97)
Monocytes Absolute: 0.6 10*3/uL (ref 0.1–0.9)
Monocytes: 7 %
Neutrophils Absolute: 6.3 10*3/uL (ref 1.4–7.0)
Neutrophils: 71 %
Platelets: 413 10*3/uL (ref 150–450)
RBC: 4.8 x10E6/uL (ref 4.14–5.80)
RDW: 14.1 % (ref 11.6–15.4)
WBC: 8.8 10*3/uL (ref 3.4–10.8)

## 2021-02-21 LAB — HIGH SENSITIVITY CRP: CRP, High Sensitivity: 28.8 mg/L — ABNORMAL HIGH (ref 0.00–3.00)

## 2021-02-21 LAB — SEDIMENTATION RATE: Sed Rate: 23 mm/hr (ref 0–30)

## 2021-02-24 ENCOUNTER — Encounter (HOSPITAL_BASED_OUTPATIENT_CLINIC_OR_DEPARTMENT_OTHER): Payer: Self-pay

## 2021-02-24 ENCOUNTER — Other Ambulatory Visit: Payer: Self-pay

## 2021-02-24 ENCOUNTER — Ambulatory Visit (HOSPITAL_BASED_OUTPATIENT_CLINIC_OR_DEPARTMENT_OTHER)
Admission: RE | Admit: 2021-02-24 | Discharge: 2021-02-24 | Disposition: A | Discharge status: Home / Self Care | Payer: Medicaid Other | Source: Ambulatory Visit | Attending: Gastroenterology | Admitting: Gastroenterology

## 2021-02-24 DIAGNOSIS — R109 Unspecified abdominal pain: Secondary | ICD-10-CM | POA: Diagnosis present

## 2021-02-24 DIAGNOSIS — R188 Other ascites: Secondary | ICD-10-CM | POA: Insufficient documentation

## 2021-02-24 DIAGNOSIS — K8689 Other specified diseases of pancreas: Secondary | ICD-10-CM | POA: Diagnosis not present

## 2021-02-24 MED ORDER — SPIRONOLACTONE 25 MG PO TABS: 25.0000 mg | ORAL_TABLET | Freq: Every day | ORAL | 0 refills | Status: DC

## 2021-02-24 MED ORDER — FUROSEMIDE 20 MG PO TABS: 20.0000 mg | ORAL_TABLET | Freq: Two times a day (BID) | ORAL | 6 refills | Status: DC

## 2021-02-24 MED ORDER — IOHEXOL 350 MG/ML SOLN
60.0000 mL | Freq: Once | INTRAVENOUS | Status: AC | PRN
  Administered 2021-02-24: 60 mL via INTRAVENOUS

## 2021-02-25 ENCOUNTER — Encounter: Payer: Self-pay | Admitting: General Surgery

## 2021-02-25 ENCOUNTER — Ambulatory Visit (INDEPENDENT_AMBULATORY_CARE_PROVIDER_SITE_OTHER): Payer: Medicaid Other | Admitting: General Surgery

## 2021-02-25 VITALS — BP 142/90 | HR 94 | Temp 98.3°F | Resp 16 | Ht 69.0 in | Wt 203.0 lb

## 2021-02-25 DIAGNOSIS — Z4803 Encounter for change or removal of drains: Secondary | ICD-10-CM

## 2021-02-25 DIAGNOSIS — K8689 Other specified diseases of pancreas: Secondary | ICD-10-CM | POA: Diagnosis not present

## 2021-02-25 DIAGNOSIS — K9189 Other postprocedural complications and disorders of digestive system: Secondary | ICD-10-CM

## 2021-02-25 DIAGNOSIS — K859 Acute pancreatitis without necrosis or infection, unspecified: Secondary | ICD-10-CM

## 2021-02-25 DIAGNOSIS — K839 Disease of biliary tract, unspecified: Secondary | ICD-10-CM | POA: Diagnosis not present

## 2021-02-25 NOTE — Progress Notes (Signed)
[facility] Note   HPI:  50 y.o. Male presents to clinic for follow-up evaluation of his operative drain in the gallbladder fossa. Dr. Rush Landmark checked it for bile and it was negative. He is still having some clearish yellow output.   He says he has been emptying the drain less and Dr. Rush Landmark has increased his lasix  given the ascites.  He has an ECHO next week. He just had a CT and I reviewed it and ascites with minimal around  the liver, and stent in place. 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  Review of Systems:  No fevers Improving appetite at times, eats some days more than others Drainage decreasing from drain but still drainage around, soaking  gauze All other review of systems: otherwise negative   Vital Signs:  BP (!) 142/90   Pulse 94   Temp 98.3 F (36.8 C) (Other (Comment))   Resp 16   Ht '5\' 9"'$  (1.753 m)   Wt 203 lb (92.1 kg)   SpO2 98%   BMI 29.98 kg/m    Physical Exam:  Physical Exam Vitals reviewed.  HENT:     Head: Normocephalic.  Cardiovascular:     Rate and Rhythm: Normal rate.  Pulmonary:     Effort: Pulmonary effort is normal.  Abdominal:     General: There is distension.     Palpations: Abdomen is soft.     Tenderness: There is no abdominal tenderness.     Comments: Drain suture already out from skin, drain pulled and part was in  the subcutaneous tissue already, clear ascites evacuated after drain pulled and fibrinous material in drain portion that was intraabdominal, no erythema,  pressure dressing applied     Leukocytosis resolved, LFTs going down, normal bilirubin  Assessment:  50 y.o. yo Male with bile leak after difficult cholecystectomy, ERCP with stent and subsequent ERCP pancreatitis, operative drain has been in place but no bile leaking now, only ascites, drain removed today.  Plan:  Keep pressure dressing in place for 48 hours if able. If gets saturated then change.  Do not submerge in  the bath but ok to shower. And change dressing after showering. Will call next week to check and see how drainage is doing.  Call if any signs of redness or infection.   Marland Kitchenafu  Future Appointments  Date Time Provider [place]  03/03/2021  8:00 AM [facility] ECHO OP 1 [facility]ECHOLAB [facility]  03/04/2021  9:00 AM Virl Cagey, MD [facility] None  03/12/2021  2:30 PM Mansouraty, Telford Nab., MD [facility]  04/29/2021  8:15 AM [name], DO [facility] None     Curlene Labrum, MD [facility] [address] [place], [place] [address] [phone] (office)

## 2021-02-25 NOTE — Patient Instructions (Signed)
Keep pressure dressing in place for 48 hours if able. If gets saturated then change.  Do not submerge in the bath but ok to shower. And change dressing after showering. Will call next week to check and see how drainage is doing.  Call if any signs of redness or infection.

## 2021-02-26 ENCOUNTER — Telehealth: Payer: Self-pay | Admitting: Gastroenterology

## 2021-02-26 NOTE — Telephone Encounter (Signed)
See 8/23 results note

## 2021-02-26 NOTE — Telephone Encounter (Signed)
Patient's sister calling to inform patient had drainage 02/25/21 with Dr. Constance Haw. Patient weight after drainage is 203 lbs.. Please advise thank you

## 2021-03-03 ENCOUNTER — Other Ambulatory Visit: Payer: Self-pay

## 2021-03-03 ENCOUNTER — Ambulatory Visit (HOSPITAL_COMMUNITY)
Admission: RE | Admit: 2021-03-03 | Discharge: 2021-03-03 | Disposition: A | Discharge status: Home / Self Care | Payer: Medicaid Other | Source: Ambulatory Visit | Attending: Gastroenterology | Admitting: Gastroenterology

## 2021-03-03 DIAGNOSIS — R188 Other ascites: Secondary | ICD-10-CM | POA: Diagnosis not present

## 2021-03-03 DIAGNOSIS — E785 Hyperlipidemia, unspecified: Secondary | ICD-10-CM | POA: Insufficient documentation

## 2021-03-03 DIAGNOSIS — I10 Essential (primary) hypertension: Secondary | ICD-10-CM

## 2021-03-03 DIAGNOSIS — F17200 Nicotine dependence, unspecified, uncomplicated: Secondary | ICD-10-CM | POA: Diagnosis not present

## 2021-03-03 LAB — ECHOCARDIOGRAM COMPLETE
AR max vel: 3.03 cm2
AV Area VTI: 2.6 cm2
AV Area mean vel: 2.79 cm2
AV Mean grad: 3 mmHg
AV Peak grad: 4.8 mmHg
Ao pk vel: 1.09 m/s
Area-P 1/2: 4.46 cm2
MV VTI: 2.36 cm2
S' Lateral: 2.9 cm

## 2021-03-03 MED ORDER — PERFLUTREN LIPID MICROSPHERE
1.0000 mL | INTRAVENOUS | Status: AC | PRN
  Administered 2021-03-03: 2 mL via INTRAVENOUS
  Filled 2021-03-03: qty 10

## 2021-03-03 NOTE — Progress Notes (Signed)
  Echocardiogram 2D Echocardiogram has been performed.  Steve Romero 03/03/2021, 9:02 AM

## 2021-03-04 ENCOUNTER — Encounter: Payer: Medicaid Other | Admitting: General Surgery

## 2021-03-05 ENCOUNTER — Other Ambulatory Visit: Payer: Self-pay

## 2021-03-05 DIAGNOSIS — K8689 Other specified diseases of pancreas: Secondary | ICD-10-CM

## 2021-03-05 DIAGNOSIS — R188 Other ascites: Secondary | ICD-10-CM

## 2021-03-07 ENCOUNTER — Other Ambulatory Visit: Payer: Self-pay

## 2021-03-07 ENCOUNTER — Other Ambulatory Visit: Payer: Medicaid Other

## 2021-03-07 DIAGNOSIS — R188 Other ascites: Secondary | ICD-10-CM

## 2021-03-07 DIAGNOSIS — K8689 Other specified diseases of pancreas: Secondary | ICD-10-CM

## 2021-03-07 LAB — COMPREHENSIVE METABOLIC PANEL
ALT: 13 IU/L (ref 0–44)
AST: 9 IU/L (ref 0–40)
Albumin/Globulin Ratio: 1.1 — ABNORMAL LOW (ref 1.2–2.2)
Albumin: 3.5 g/dL — ABNORMAL LOW (ref 4.0–5.0)
Alkaline Phosphatase: 235 IU/L — ABNORMAL HIGH (ref 44–121)
BUN/Creatinine Ratio: 13 (ref 9–20)
BUN: 8 mg/dL (ref 6–24)
Bilirubin Total: 0.4 mg/dL (ref 0.0–1.2)
CO2: 28 mmol/L (ref 20–29)
Calcium: 9.1 mg/dL (ref 8.7–10.2)
Chloride: 101 mmol/L (ref 96–106)
Creatinine, Ser: 0.64 mg/dL — ABNORMAL LOW (ref 0.76–1.27)
Globulin, Total: 3.1 g/dL (ref 1.5–4.5)
Glucose: 101 mg/dL — ABNORMAL HIGH (ref 65–99)
Potassium: 4.1 mmol/L (ref 3.5–5.2)
Sodium: 142 mmol/L (ref 134–144)
Total Protein: 6.6 g/dL (ref 6.0–8.5)
eGFR: 115 mL/min/{1.73_m2} (ref >59)

## 2021-03-07 LAB — CBC WITH DIFFERENTIAL/PLATELET
Basophils Absolute: 0.1 x10E3/uL (ref 0.0–0.2)
Basos: 1 %
EOS (ABSOLUTE): 0.3 x10E3/uL (ref 0.0–0.4)
Eos: 2 %
Hematocrit: 42.1 % (ref 37.5–51.0)
Hemoglobin: 13.9 g/dL (ref 13.0–17.7)
Immature Grans (Abs): 0.1 x10E3/uL (ref 0.0–0.1)
Immature Granulocytes: 1 %
Lymphocytes Absolute: 1.3 x10E3/uL (ref 0.7–3.1)
Lymphs: 10 %
MCH: 28.9 pg (ref 26.6–33.0)
MCHC: 33 g/dL (ref 31.5–35.7)
MCV: 88 fL (ref 79–97)
Monocytes Absolute: 0.7 x10E3/uL (ref 0.1–0.9)
Monocytes: 6 %
Neutrophils Absolute: 10.4 x10E3/uL — ABNORMAL HIGH (ref 1.4–7.0)
Neutrophils: 80 %
Platelets: 335 x10E3/uL (ref 150–450)
RBC: 4.81 x10E6/uL (ref 4.14–5.80)
RDW: 13.2 % (ref 11.6–15.4)
WBC: 12.8 x10E3/uL — ABNORMAL HIGH (ref 3.4–10.8)

## 2021-03-07 LAB — SEDIMENTATION RATE: Sed Rate: 10 mm/h (ref 0–30)

## 2021-03-07 LAB — HIGH SENSITIVITY CRP: CRP, High Sensitivity: 158.45 mg/L — ABNORMAL HIGH (ref 0.00–3.00)

## 2021-03-07 NOTE — Addendum Note (Signed)
Addended by: Reather Converse on: 03/07/2021 09:59 AM   Modules accepted: Orders

## 2021-03-07 NOTE — Addendum Note (Signed)
Addended by: Reather Converse on: 03/07/2021 09:57 AM   Modules accepted: Orders

## 2021-03-11 ENCOUNTER — Ambulatory Visit (INDEPENDENT_AMBULATORY_CARE_PROVIDER_SITE_OTHER): Payer: Medicaid Other | Admitting: General Surgery

## 2021-03-11 DIAGNOSIS — K839 Disease of biliary tract, unspecified: Secondary | ICD-10-CM

## 2021-03-11 DIAGNOSIS — Z4803 Encounter for change or removal of drains: Secondary | ICD-10-CM

## 2021-03-11 NOTE — Progress Notes (Signed)
Steve Romero - pt's sister called back and states that he is going great and the place where his drain was removed has completely healed over.

## 2021-03-11 NOTE — Progress Notes (Signed)
[facility]  Patient's drain removed 2 weeks ago. Tried home number but no answer. Left Becky a message to see how he is doing.  Will try to call them back again tomorrow if they do not call back.  Curlene Labrum, MD [facility] [address] [place], [place] [address] [phone] (office)

## 2021-03-12 ENCOUNTER — Encounter: Payer: Self-pay | Admitting: Gastroenterology

## 2021-03-12 ENCOUNTER — Ambulatory Visit: Payer: Medicaid Other | Admitting: Gastroenterology

## 2021-03-12 VITALS — BP 132/90 | HR 100 | Ht 69.0 in | Wt 202.2 lb

## 2021-03-12 DIAGNOSIS — R933 Abnormal findings on diagnostic imaging of other parts of digestive tract: Secondary | ICD-10-CM

## 2021-03-12 DIAGNOSIS — K2980 Duodenitis without bleeding: Secondary | ICD-10-CM

## 2021-03-12 DIAGNOSIS — R748 Abnormal levels of other serum enzymes: Secondary | ICD-10-CM

## 2021-03-12 DIAGNOSIS — K8689 Other specified diseases of pancreas: Secondary | ICD-10-CM | POA: Diagnosis not present

## 2021-03-12 DIAGNOSIS — R188 Other ascites: Secondary | ICD-10-CM | POA: Diagnosis not present

## 2021-03-12 DIAGNOSIS — R1084 Generalized abdominal pain: Secondary | ICD-10-CM | POA: Diagnosis not present

## 2021-03-12 DIAGNOSIS — K859 Acute pancreatitis without necrosis or infection, unspecified: Secondary | ICD-10-CM

## 2021-03-12 DIAGNOSIS — R932 Abnormal findings on diagnostic imaging of liver and biliary tract: Secondary | ICD-10-CM

## 2021-03-12 DIAGNOSIS — R634 Abnormal weight loss: Secondary | ICD-10-CM

## 2021-03-12 DIAGNOSIS — R6881 Early satiety: Secondary | ICD-10-CM

## 2021-03-12 DIAGNOSIS — Z8719 Personal history of other diseases of the digestive system: Secondary | ICD-10-CM

## 2021-03-12 DIAGNOSIS — K831 Obstruction of bile duct: Secondary | ICD-10-CM

## 2021-03-12 MED ORDER — ESOMEPRAZOLE MAGNESIUM 40 MG PO CPDR: 40.0000 mg | DELAYED_RELEASE_CAPSULE | Freq: Every day | ORAL | 6 refills | Status: DC

## 2021-03-12 MED ORDER — PANCRELIPASE (LIP-PROT-AMYL) 36000-114000 UNITS PO CPEP: ORAL_CAPSULE | ORAL | 11 refills | Status: DC

## 2021-03-12 MED ORDER — SUCRALFATE 1 GM/10ML PO SUSP: 1.0000 g | Freq: Two times a day (BID) | ORAL | 3 refills | Status: DC

## 2021-03-12 NOTE — Patient Instructions (Addendum)
Please follow up with Dr Rush Landmark on 04/16/21 at 210 pm.  We have sent the following medications to your pharmacy for you to pick up at your convenience: Nexium 40 mg once daily Carafate liquid twice daily  We have given you samples of the following medication to take: Creon 36,000 units-- Take 1-2 capsules by mouth twice daily. If this helps, please call our office for a prescription.  Please make certain to get labs with Dr Lajuana Ripple (CBC, CMET, SED RATE, CRP, PT/INR) in 2-3 weeks.  If you are age 82 or older, your body mass index should be between 23-30. Your Body mass index is 29.86 kg/m. If this is out of the aforementioned range listed, please consider follow up with your Primary Care Provider.  If you are age 28 or younger, your body mass index should be between 19-25. Your Body mass index is 29.86 kg/m. If this is out of the aformentioned range listed, please consider follow up with your Primary Care Provider.   __________________________________________________________  The [facility] providers would like to encourage you to use [facility] to communicate with providers for non-urgent requests or questions.  Due to long hold times on the telephone, sending your provider a message by [facility] may be a faster and more efficient way to get a response.  Please allow 48 business hours for a response.  Please remember that this is for non-urgent requests.

## 2021-03-12 NOTE — Progress Notes (Signed)
[place] VISIT   Primary Care Provider Ronnie Doss [place], DO [place] [address] [phone]  Patient Profile: Steve Romero is a 50 y.o. 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  The patient presents to the [facility] for an evaluation and management of problem(s) noted below:  Problem List 1. Pancreatic necrosis   2. Generalized abdominal pain   3. Unintentional weight loss   4. Other ascites   5. Early satiety   6. Duodenitis   7. Abnormal CT scan, gastrointestinal tract   8. Biliary stricture   9. Severe acute pancreatitis   10. Elevated alkaline phosphatase level   11. Abnormal CT of liver      History of Present Illness Please see hospitalization and prior outpatient notes for full details of HPI.    Interval History The patient is accompanied by his sister today (mother is working).  Within the last few weeks, the patient has had his postoperative JP drain removed by Dr. Constance Haw.  Within a few days, the previously leaking had healed and drainage was no longer occurring.  He has remained on diuretic therapy for what was believed to be ascites fluid.  He underwent a TTE which was reviewed by his PCP without overt signs of severe diastolic or systolic heart failure.  Repeat labs have shown no progression or worsening kidney function to consider nephrotic syndrome causing ascites.  Imaging was repeated with significant smoldering inflammation still present with concern of  splenoportal narrowing as result of his significant inflammation.  He has chronic inflammation of the small bowel as well.  He is currently not on PPI therapy.  Weight has thankfully been stable overall over the course the last few weeks.  He will have days of doing well but if he tries to eat something that may have more fat for his fried he will not feel well for the next few days and have to stop eating as much.  He has not noted any issues with darkened urine at this time.  He denies any fevers or chills.  Abdominal upset and early satiety still persist however.  He and his sister are not keen on going back into the hospital but understand if it is necessary to help and he will he is willing to consider it.  He remains scared he is never going to get better.  He remains abstinent of any NSAIDs.  His bowels are semisolid and had been since the pancreatitis.  He is not taking any Creon supplementation.  GI Review of Systems Positive as above including some pyrosis, nausea, early satiety Negative for odynophagia, dysphagia, melena, hematochezia   Review of Systems General: Denies fevers/chills/weight loss Cardiovascular: Denies chest pain/palpitations Pulmonary: Denies shortness of breath Gastroenterological: See HPI Genitourinary: As per HPI Hematological: Denies easy bruising/bleeding Dermatological: Denies jaundice Psychological: Mood is stable but he is scared he will never get better still   Medications Current Outpatient Medications  Medication Sig Dispense Refill   albuterol (VENTOLIN HFA) 108 (90 Base) MCG/ACT inhaler Inhale 2 puffs into the lungs every 6 (six) hours as needed for wheezing or shortness of breath. 8 g 0   esomeprazole (NEXIUM) 40 MG  capsule Take 1 capsule (40 mg total) by mouth daily at 12 noon. 30 capsule 6   furosemide (LASIX) 20 MG tablet Take 1 tablet (20 mg total) by mouth 2 (two) times daily. 60 tablet 6   lipase/protease/amylase (CREON) 36000 UNITS CPEP capsule  Take 1-2 capsules by mouth twice daily 240 capsule 11   oxyCODONE (OXY IR/ROXICODONE) 5 MG immediate release tablet Take 1-2 tablets (5-10 mg total) by mouth every 4 (four) hours as needed for severe pain or breakthrough pain. 30 tablet 0   spironolactone (ALDACTONE) 25 MG tablet Take 1 tablet (25 mg total) by mouth daily. 30 tablet 0   sucralfate (CARAFATE) 1 GM/10ML suspension Take 10 mLs (1 g total) by mouth 2 (two) times daily. 600 mL 3   No current facility-administered medications for this visit.    Allergies Allergies  Allergen Reactions   Desipramine Nausea Only and Rash    Histories Past Medical History:  Diagnosis Date   Anxiety 10-2014   Bipolar disorder [facility]) age 57   COPD (chronic obstructive pulmonary disease) ([place]) 2016   Depression age 62   Enlarged prostate    Hyperlipidemia 2016   Hypertension 2013   Schizoaffective disorder ([place]) 11-13-14   Sleep apnea    Thyroid disease 11-2014   Past Surgical History:  Procedure Laterality Date   BALLOON DILATION N/A 11/07/2020   Procedure: BALLOON DILATION;  Surgeon: Irving Copas., MD;  Location: [place];  Service: Gastroenterology;  Laterality: N/A;   BILIARY STENT PLACEMENT N/A 10/10/2020   Procedure: BILIARY STENT PLACEMENT;  Surgeon: Rogene Houston, MD;  Location: [facility];  Service: Endoscopy;  Laterality: N/A;   BILIARY STENT PLACEMENT  11/09/2020   Procedure: BILIARY STENT PLACEMENT;  Surgeon: Rush Landmark Telford Nab., MD;  Location: [place];  Service: Gastroenterology;;   BILIARY STENT PLACEMENT  11/07/2020   Procedure: BILIARY STENT PLACEMENT;  Surgeon: Irving Copas., MD;  Location: [place];  Service: Gastroenterology;;   BILIARY STENT PLACEMENT  11/11/2020   Procedure: BILIARY STENT PLACEMENT;  Surgeon: Irving Copas., MD;  Location: [place];  Service: Gastroenterology;;   BILIARY STENT PLACEMENT  11/14/2020   Procedure: BILIARY STENT PLACEMENT;  Surgeon: Irving Copas., MD;  Location: [place];  Service: Gastroenterology;;   BILIARY STENT PLACEMENT N/A 01/29/2021   Procedure: BILIARY STENT PLACEMENT;  Surgeon: Irving Copas., MD;  Location: [facility];  Service: Gastroenterology;  Laterality: N/A;   BIOPSY  11/07/2020   Procedure: BIOPSY;  Surgeon: Rush Landmark Telford Nab., MD;  Location: [place];  Service: Gastroenterology;;   BIOPSY  12/20/2020   Procedure: BIOPSY;  Surgeon: Irving Copas., MD;  Location: [facility];  Service: Gastroenterology;;   [place] RELEASE Left 02/21/2016   Procedure: CARPAL TUNNEL RELEASE;  Surgeon: Carole Civil, MD;  Location: [facility];  Service: Orthopedics;  Laterality: Left;   CHOLECYSTECTOMY N/A 10/09/2020   Procedure: LAPAROSCOPIC CHOLECYSTECTOMY;  Surgeon: Virl Cagey, MD;  Location: [facility];  Service: General;  Laterality: N/A;   CYST ENTEROSTOMY N/A 11/07/2020   Procedure: CYST ENTEROSTOMY;  Surgeon: Irving Copas., MD;  Location: [place];  Service: Gastroenterology;  Laterality: N/A;   CYST GASTROSTOMY  11/11/2020   Procedure: CYST NECROSECTOMY;  Surgeon: Rush Landmark Telford Nab., MD;  Location: [place];  Service: Gastroenterology;;   CYST GASTROSTOMY  11/14/2020   Procedure: CYST NECROSECTOMY;  Surgeon: Irving Copas., MD;  Location: [place];  Service: Gastroenterology;;   ENDOSCOPIC RETROGRADE CHOLANGIOPANCREATOGRAPHY (ERCP) WITH PROPOFOL  N/A 11/09/2020   Procedure: ENDOSCOPIC RETROGRADE CHOLANGIOPANCREATOGRAPHY (ERCP) WITH PROPOFOL;  Surgeon: Rush Landmark Telford Nab., MD;  Location: [place];  Service: Gastroenterology;  Laterality: N/A;   ENDOSCOPIC RETROGRADE CHOLANGIOPANCREATOGRAPHY (ERCP) WITH PROPOFOL N/A 12/20/2020   Procedure: ENDOSCOPIC RETROGRADE CHOLANGIOPANCREATOGRAPHY (ERCP) WITH PROPOFOL;  Surgeon: Rush Landmark Telford Nab., MD;  Location: [facility];  Service: Gastroenterology;  Laterality: N/A;   ENDOSCOPIC RETROGRADE  CHOLANGIOPANCREATOGRAPHY (ERCP) WITH PROPOFOL N/A 01/29/2021   Procedure: ENDOSCOPIC RETROGRADE CHOLANGIOPANCREATOGRAPHY (ERCP) WITH PROPOFOL;  Surgeon: Rush Landmark Telford Nab., MD;  [facility];  Service: Gastroenterology;  Laterality: N/A;   ERCP N/A 10/10/2020   Procedure: ENDOSCOPIC RETROGRADE CHOLANGIOPANCREATOGRAPHY (ERCP);  Surgeon: Rogene Houston, MD;  [facility];  Service: Endoscopy;  Laterality: N/A;   ESOPHAGOGASTRODUODENOSCOPY N/A 11/11/2020   Procedure: ESOPHAGOGASTRODUODENOSCOPY (EGD);  Surgeon: Irving Copas., MD;  Location: [place];  Service: Gastroenterology;  Laterality: N/A;   ESOPHAGOGASTRODUODENOSCOPY N/A 11/14/2020   Procedure: ESOPHAGOGASTRODUODENOSCOPY (EGD);  Surgeon: Irving Copas., MD;  Location: [place];  Service: Gastroenterology;  Laterality: N/A;   ESOPHAGOGASTRODUODENOSCOPY (EGD) WITH PROPOFOL N/A 11/09/2020   Procedure: ESOPHAGOGASTRODUODENOSCOPY (EGD) WITH PROPOFOL;  Surgeon: Rush Landmark Telford Nab., MD;  Location: [place];  Service: Gastroenterology;  Laterality: N/A;   ESOPHAGOGASTRODUODENOSCOPY (EGD) WITH PROPOFOL N/A 11/07/2020   Procedure: ESOPHAGOGASTRODUODENOSCOPY (EGD) WITH PROPOFOL;  Surgeon: Rush Landmark Telford Nab., MD;  Location: [place];  Service: Gastroenterology;  Laterality: N/A;   ESOPHAGOGASTRODUODENOSCOPY (EGD) WITH PROPOFOL N/A 12/20/2020   Procedure: ESOPHAGOGASTRODUODENOSCOPY (EGD) WITH PROPOFOL;  Surgeon: Rush Landmark Telford Nab., MD;  [facility];  Service: Gastroenterology;  Laterality: N/A;   EUS  11/14/2020   Procedure: UPPER ENDOSCOPIC ULTRASOUND (EUS) LINEAR;  Surgeon: Rush Landmark Telford Nab., MD;  Location: [place];  Service: Gastroenterology;;   GASTROINTESTINAL STENT REMOVAL  12/20/2020   Procedure: GASTROINTESTINAL STENT REMOVAL;  Surgeon: Irving Copas., MD;  [facility];  Service: Gastroenterology;;  cyst gastrostomy stent and double pig tail stents x2  removed   HERNIA REPAIR Left 2002   groin   INGUINAL HERNIA REPAIR Left 04/05/2017   Procedure: RECURRENT HERNIA REPAIR INGUINAL ADULT WITH MESH;  Surgeon: [name], MD;  Location: [facility];  Service: General;  Laterality: Left;   MASS EXCISION Right 04/29/2020   Procedure: EXCISION MASS RIGHT WRIST;  Surgeon: [name], MD;  Location: [place];  Service: Orthopedics;  Laterality: Right;   MASS EXCISION Right 10/09/2020   Procedure: EXCISION MASS, ABDOMINAL WALL, 2CM;  Surgeon: Virl Cagey, MD;  Location: [facility];  Service: General;  Laterality: Right;   REMOVAL OF STONES  12/20/2020   Procedure: REMOVAL OF STONES;  Surgeon: Irving Copas., MD;  Location[facility];  Service: Gastroenterology;;   Joan Mayans N/A 10/10/2020   Procedure: Joan Mayans;  Surgeon: Rogene Houston, MD;  Location[facility];  Service: Endoscopy;  Laterality: N/A;   SPYGLASS CHOLANGIOSCOPY N/A 12/20/2020   Procedure: SPYGLASS CHOLANGIOSCOPY;  Surgeon: Irving Copas., MD;  [facility];  Service: Gastroenterology;  Laterality: N/A;   STENT REMOVAL  11/09/2020   Procedure: STENT REMOVAL;  Surgeon: Irving Copas., MD;  Location: [place];  Service: Gastroenterology;;   Lavell Islam REMOVAL  11/11/2020   Procedure: STENT REMOVAL;  Surgeon: Irving Copas., MD;  Location: [place];  Service: Gastroenterology;;   Lavell Islam REMOVAL  11/14/2020   Procedure: STENT REMOVAL;  Surgeon: Irving Copas., MD;  Location: [place];  Service: Gastroenterology;;   STENT REMOVAL  12/20/2020   Procedure: STENT REMOVAL;  Surgeon: Rush Landmark,  Telford Nab., MD;  Location: [facility];  Service: Gastroenterology;;  biliary x2   UPPER ESOPHAGEAL ENDOSCOPIC ULTRASOUND (EUS) N/A 11/07/2020   Procedure: UPPER ESOPHAGEAL ENDOSCOPIC ULTRASOUND (EUS);  Surgeon: Irving Copas., MD;  Location: [place];  Service: Gastroenterology;  Laterality: N/A;   WOUND  DEBRIDEMENT  11/09/2020   Procedure: CYST NECROSECTOMY;  Surgeon: Rush Landmark Telford Nab., MD;  Location: [facility];  Service: Gastroenterology;;   Social History   Socioeconomic History   Marital status: Single    Spouse name: Not on file   Number of children: Not on file   Years of education: Not on file   Highest education level: Not on file  Occupational History   Not on file  Tobacco Use   Smoking status: Every Day    Years: 26.00    Types: Cigarettes   Smokeless tobacco: Never   Tobacco comments:    4-5 cig daily as of 10/07/2020.  Vaping Use   Vaping Use: Never used  Substance and Sexual Activity   Alcohol use: No   Drug use: No   Sexual activity: Never    Birth control/protection: None  Other Topics Concern   Not on file  Social History Narrative   Not on file   Social Determinants of Health   Financial Resource Strain: Not on file  Food Insecurity: Not on file  Transportation Needs: Not on file  Physical Activity: Not on file  Stress: Not on file  Social Connections: Not on file  Intimate Partner Violence: Not on file   Family History  Problem Relation Age of Onset   Diabetes Father    Heart disease Father    Heart disease Maternal Grandmother    Stroke Maternal Grandfather    Colon cancer Neg Hx    Esophageal cancer Neg Hx    Inflammatory bowel disease Neg Hx    Liver disease Neg Hx    Pancreatic cancer Neg Hx    Rectal cancer Neg Hx    Stomach cancer Neg Hx    I have reviewed his medical, social, and family history in detail and updated the electronic medical record as necessary.    PHYSICAL EXAMINATION  BP 132/90   Pulse 100   Ht 5' 9"  (1.753 m)   Wt 202 lb 3.2 oz (91.7 kg)   BMI 29.86 kg/m  Wt Readings from Last 3 Encounters:  03/12/21 202 lb 3.2 oz (91.7 kg)  02/25/21 203 lb (92.1 kg)  01/31/21 201 lb (91.2 kg)  GEN: NAD, appears stated age, doesn't appear chronically ill, accompanied by sister PSYCH: Cooperative, without pressured  speech EYE: Anicteric sclerae ENT: MMM CV: Nontachycardic RESP: No audible wheezing GI: NABS, soft, protuberant abdomen, tenderness to palpation in midepigastrium, without rebound or guarding  MSK/EXT: No significant lower extremity edema today SKIN: No jaundice NEURO:  Alert & Oriented x 3, no focal deficits   REVIEW OF DATA  I reviewed the following data at the time of this encounter:  GI Procedures and Studies  July 2022 ERCP - Erythematous mucosa in the stomach - previously biopsied. - Congested duodenal mucosa. - Prior biliary sphincterotomy appeared open. - The major papilla appeared edematous. - Distal biliary stricture noted - inflammatory in nature. - One covered metal biliary stent was placed into the common bile duct.  Laboratory Studies  Reviewed those in epic  Imaging Studies  August 2022 CT abdomen pelvis with contrast IMPRESSION: 1. No substantial change in exam. 2. Persistent peripancreatic edema/inflammation extending into the  transverse mesocolon and gastrocolic ligament. There is circumferential edema/inflammation involving the descending duodenum, similar to prior with extensive edema in the anterior pararenal space of the retroperitoneum. 3. 15 x 14 mm collection of fluid and gas is identified in the body of pancreas in a region of apparent tethering/scarring towards the posterior wall the stomach. 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 4. Inflammatory changes in the pancreas markedly attenuates the portal splenic confluence with only a string sign visible. Superior mesenteric and portal veins do appear to remain patent. Patency of the splenic vein cannot be confirmed. 5. Interval decrease in intrahepatic biliary duct dilatation with common bile duct stent visualized in situ.  ASSESSMENT  Steve Romero is a 50 y.o. 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  The patient is seen today for evaluation and management of:  1. Pancreatic necrosis   2. Generalized abdominal pain   3. Unintentional weight loss   4. Other ascites   5. Early satiety   6. Duodenitis   7. Abnormal CT scan, gastrointestinal tract   8. Biliary stricture   9. Severe acute pancreatitis   10. Elevated alkaline phosphatase level   11. Abnormal CT of liver    The patient is hemodynamically stable.  Clinically he seems to be somewhat stable at this time.  He has had quite a few months with this post ERCP pancreatitis and looks to still have some smoldering inflammation.  Interestingly, he has been thankfully been able to maintain his weight over the last few weeks.  Previous ascites that he had seems to be stable at this time and he is no longer having leakage like he was previously.  I suspect his ascites is a result of the splenoportal narrowing that has occurred in the setting of also significant protein loss.  Thankfully labs do suggest some continued improvement in his protein levels.  I think the most critical means for his treatment will be continued nutrition.  If we cannot get his protein levels up higher and he continues to have issues with eating and nutrition, he will likely require hospitalization for a core track or TPN supplementation.  Hopefully we can stave this off.  We discussed the minimization of anything with significant fat intake to try to minimize the smoldering pancreatitis that has been occurring.  I have discussed his case with multiple surgeons and there is no  surgical means of treatment at this time but will need to be thoughtful of things moving forward.  At some point he will need colon cancer screening via colonoscopy but that is not for now.  I am going to try to decrease inflammation with the use of PPI therapy as well as Carafate in an effort of potentially trying to treat the congestion of the small bowel to see if that may also help symptoms.  Time will tell.  He is getting keep his biliary stent in place for now as this is greatly improved the biliary duct dilation which was occurring as a result of the inflammatory process of the pancreas.  I offered the patient and sister a referral to one of the quaternary centers but they defer on this currently.  They also defer on hospitalization at this time.  There remains no drainable fluid collections based on the most recent imaging.  Time will tell.  We will see him back in a few weeks.  I feel comfortable with him going to the beach to relax a bit and to see if that will allow him time to improve.  We will maintain his current diuretics at this time.  Not sure if this will be a long-term use of diuretics but we will see.  All patient questions were answered to the best of my ability, and the patient agrees to the aforementioned plan of action with follow-up as indicated.   PLAN  Laboratories as outlined below will be obtained in approximately 3 weeks at PCP office as we have done previously Follow-up ERCP within the next 3 to 4 months based on inflammatory process occurring Repeat cross-sectional imaging in 6 weeks Continue Lasix 25 mg daily Continue spironolactone 25 mg daily Minimize fat intake as much as possible - Baked/broiled/grilled/saut but do not fry -Continue to work on intake of protein shakes 2-3 times per day If patient is having progressive weight loss into next week then there is a chance he may need to be admitted for enteral nutrition/TPN initiation Offered referral to [facility] or [place] or [place] but the patient/family defer on this currently with hope that he will continue to improve Will initiate Nexium 40 mg daily Will initiate Carafate twice daily Samples of Creon given to patient to see if this will help at which point we will prescribe if there is some improvement   Orders Placed This Encounter  Procedures   CBC   Comp Met (CMET)   Sedimentation rate   C-reactive protein   INR/PT    New Prescriptions   ESOMEPRAZOLE (NEXIUM) 40 MG CAPSULE    Take 1 capsule (40 mg total) by mouth daily at 12 noon.   LIPASE/PROTEASE/AMYLASE (CREON) 36000 UNITS CPEP CAPSULE    Take 1-2 capsules by mouth twice daily   SUCRALFATE (CARAFATE) 1 GM/10ML SUSPENSION    Take 10 mLs (1 g total) by mouth 2 (two) times daily.   Modified Medications   No medications on file    Planned Follow Up No follow-ups on file.   Total Time in Face-to-Face and in Coordination of Care for patient including independent/personal interpretation/review of prior testing, medical history, examination, medication adjustment, communicating results with the patient directly, and documentation with the EHR is 30 minutes.   Justice Britain, MD [facility] Office # [phone]    5 PM addendum Patient's laboratories were evaluated and he has evidence of jaundice at this time.  Leukocytosis is the best it has been over the course of last 6 weeks.  Will initiate ciprofloxacin 500 mg twice daily as prophylaxis against cholangitis.  If the patient develops progressive fevers (greater than 100.3) or chills or worsening symptoms he will come into the hospital after calling to update [place].  He may require ERCP earlier in the next week if that is the case.  I spoke with the patient and patient's sister about this and they agree with this plan of action

## 2021-03-13 DIAGNOSIS — K2980 Duodenitis without bleeding: Secondary | ICD-10-CM | POA: Insufficient documentation

## 2021-03-13 DIAGNOSIS — R933 Abnormal findings on diagnostic imaging of other parts of digestive tract: Secondary | ICD-10-CM | POA: Insufficient documentation

## 2021-03-13 DIAGNOSIS — K831 Obstruction of bile duct: Secondary | ICD-10-CM | POA: Insufficient documentation

## 2021-03-13 DIAGNOSIS — K859 Acute pancreatitis without necrosis or infection, unspecified: Secondary | ICD-10-CM | POA: Insufficient documentation

## 2021-03-26 ENCOUNTER — Other Ambulatory Visit: Payer: Self-pay

## 2021-03-26 ENCOUNTER — Other Ambulatory Visit: Payer: Medicaid Other

## 2021-03-26 DIAGNOSIS — K8689 Other specified diseases of pancreas: Secondary | ICD-10-CM

## 2021-03-26 DIAGNOSIS — K859 Acute pancreatitis without necrosis or infection, unspecified: Secondary | ICD-10-CM

## 2021-03-26 DIAGNOSIS — K831 Obstruction of bile duct: Secondary | ICD-10-CM

## 2021-03-26 DIAGNOSIS — R6881 Early satiety: Secondary | ICD-10-CM

## 2021-03-26 DIAGNOSIS — K2980 Duodenitis without bleeding: Secondary | ICD-10-CM

## 2021-03-26 DIAGNOSIS — R1084 Generalized abdominal pain: Secondary | ICD-10-CM

## 2021-03-26 DIAGNOSIS — R748 Abnormal levels of other serum enzymes: Secondary | ICD-10-CM

## 2021-03-26 DIAGNOSIS — R933 Abnormal findings on diagnostic imaging of other parts of digestive tract: Secondary | ICD-10-CM

## 2021-03-26 DIAGNOSIS — R932 Abnormal findings on diagnostic imaging of liver and biliary tract: Secondary | ICD-10-CM

## 2021-03-26 DIAGNOSIS — R634 Abnormal weight loss: Secondary | ICD-10-CM

## 2021-03-26 MED ORDER — PANCRELIPASE (LIP-PROT-AMYL) 36000-114000 UNITS PO CPEP: ORAL_CAPSULE | ORAL | 11 refills | Status: DC

## 2021-03-26 NOTE — Addendum Note (Signed)
Addended by: Reather Converse on: 03/26/2021 09:36 AM   Modules accepted: Orders

## 2021-03-26 NOTE — Telephone Encounter (Signed)
Prescription has been sent to the pharmacy as ordered.

## 2021-03-26 NOTE — Telephone Encounter (Signed)
Received message that patient is tolerating the Creon samples.   Will proceed with prescription. If not financially able to maintain, then will ask for Patient-Assistance. I'll forward to my team to start working on this.  Creon 72000 at each meal and 36000 with each snack.  Creon 36K pills  2 pills with each meal + 1 pill with each snack  Please proceed with prescription.  Thanks. GM

## 2021-03-27 LAB — COMPREHENSIVE METABOLIC PANEL
ALT: 21 IU/L (ref 0–44)
AST: 15 IU/L (ref 0–40)
Albumin/Globulin Ratio: 1.3 (ref 1.2–2.2)
Albumin: 3.9 g/dL — ABNORMAL LOW (ref 4.0–5.0)
Alkaline Phosphatase: 209 IU/L — ABNORMAL HIGH (ref 44–121)
BUN/Creatinine Ratio: 11 (ref 9–20)
BUN: 8 mg/dL (ref 6–24)
Bilirubin Total: 0.3 mg/dL (ref 0.0–1.2)
CO2: 25 mmol/L (ref 20–29)
Calcium: 9.6 mg/dL (ref 8.7–10.2)
Chloride: 101 mmol/L (ref 96–106)
Creatinine, Ser: 0.73 mg/dL — ABNORMAL LOW (ref 0.76–1.27)
Globulin, Total: 3.1 g/dL (ref 1.5–4.5)
Glucose: 118 mg/dL — ABNORMAL HIGH (ref 65–99)
Potassium: 4.4 mmol/L (ref 3.5–5.2)
Sodium: 139 mmol/L (ref 134–144)
Total Protein: 7 g/dL (ref 6.0–8.5)
eGFR: 111 mL/min/{1.73_m2} (ref >59)

## 2021-03-27 LAB — CBC
Hematocrit: 47.2 % (ref 37.5–51.0)
Hemoglobin: 15.5 g/dL (ref 13.0–17.7)
MCH: 28.7 pg (ref 26.6–33.0)
MCHC: 32.8 g/dL (ref 31.5–35.7)
MCV: 87 fL (ref 79–97)
Platelets: 361 10*3/uL (ref 150–450)
RBC: 5.4 x10E6/uL (ref 4.14–5.80)
RDW: 13.6 % (ref 11.6–15.4)
WBC: 7.5 10*3/uL (ref 3.4–10.8)

## 2021-03-27 LAB — PROTIME-INR
INR: 1 (ref 0.9–1.2)
Prothrombin Time: 10.1 s (ref 9.1–12.0)

## 2021-03-27 LAB — C-REACTIVE PROTEIN: CRP: 7 mg/L (ref 0–10)

## 2021-03-27 LAB — SEDIMENTATION RATE: Sed Rate: 13 mm/hr (ref 0–30)

## 2021-04-02 ENCOUNTER — Ambulatory Visit: Payer: Medicaid Other | Admitting: Gastroenterology

## 2021-04-16 ENCOUNTER — Ambulatory Visit: Payer: Medicaid Other | Admitting: Gastroenterology

## 2021-04-16 VITALS — BP 130/86 | HR 93 | Ht 69.0 in | Wt 205.0 lb

## 2021-04-16 DIAGNOSIS — R932 Abnormal findings on diagnostic imaging of liver and biliary tract: Secondary | ICD-10-CM

## 2021-04-16 DIAGNOSIS — R1084 Generalized abdominal pain: Secondary | ICD-10-CM

## 2021-04-16 DIAGNOSIS — K831 Obstruction of bile duct: Secondary | ICD-10-CM | POA: Diagnosis not present

## 2021-04-16 DIAGNOSIS — R188 Other ascites: Secondary | ICD-10-CM | POA: Diagnosis not present

## 2021-04-16 DIAGNOSIS — R933 Abnormal findings on diagnostic imaging of other parts of digestive tract: Secondary | ICD-10-CM

## 2021-04-16 DIAGNOSIS — K8689 Other specified diseases of pancreas: Secondary | ICD-10-CM | POA: Diagnosis not present

## 2021-04-16 DIAGNOSIS — K859 Acute pancreatitis without necrosis or infection, unspecified: Secondary | ICD-10-CM

## 2021-04-16 DIAGNOSIS — K2980 Duodenitis without bleeding: Secondary | ICD-10-CM

## 2021-04-16 DIAGNOSIS — R6881 Early satiety: Secondary | ICD-10-CM

## 2021-04-16 DIAGNOSIS — R748 Abnormal levels of other serum enzymes: Secondary | ICD-10-CM

## 2021-04-16 DIAGNOSIS — R634 Abnormal weight loss: Secondary | ICD-10-CM

## 2021-04-16 MED ORDER — PANCRELIPASE (LIP-PROT-AMYL) 36000-114000 UNITS PO CPEP: ORAL_CAPSULE | ORAL | 11 refills | Status: DC

## 2021-04-16 MED ORDER — NA SULFATE-K SULFATE-MG SULF 17.5-3.13-1.6 GM/177ML PO SOLN: 1.0000 | ORAL | 0 refills | Status: DC

## 2021-04-16 NOTE — Patient Instructions (Addendum)
You have been scheduled for a CT scan of the abdomen and pelvis at [facility], 1st floor Radiology. You are scheduled on 05/21/21  at 10:00am. You should arrive 15 minutes prior to your appointment time for registration. . The solution may taste better if refrigerated, but do NOT add ice or any other liquid to this solution. Shake well before drinking.   Please follow the written instructions below on the day of your exam:   1) Do not eat anything after 6:00am (4 hours prior to your test)   2) Drink 1 bottle of contrast @ 8:00am (2 hours prior to your exam)  Remember to shake well before drinking and do NOT pour over ice.     Drink 1 bottle of contrast @ 9:00am (1 hour prior to your exam)   You may take any medications as prescribed with a small amount of water, if necessary. If you take any of the following medications: METFORMIN, GLUCOPHAGE, GLUCOVANCE, AVANDAMET, RIOMET, FORTAMET, [place] MET, JANUMET, GLUMETZA or METAGLIP, you MAY be asked to HOLD this medication 48 hours AFTER the exam.   The purpose of you drinking the oral contrast is to aid in the visualization of your intestinal tract. The contrast solution may cause some diarrhea. Depending on your individual set of symptoms, you may also receive an intravenous injection of x-ray contrast/dye. Plan on being at [facility] for 45 minutes or longer, depending on the type of exam you are having performed.   If you have any questions regarding your exam or if you need to reschedule, you may call [facility] at [phone] between the hours of 8:00 am and 5:00 pm, Monday-Friday.    Increase your Creon : Take 2-3 capsules with each meal and 1-2 capsules with each snack daily.   Please have your labs drawn 1 week prior to scheduled CT scan. You have requested that those labs be done at your PCP office. Please make sure labs have been sent to Dr. Rush Landmark.    We have sent the following medications to your pharmacy for  you to pick up at your convenience: Suprep, Creon   You have been scheduled for a colonoscopy. Please follow written instructions given to you at your visit today.  Please pick up your prep supplies at the pharmacy within the next 1-3 days. If you use inhalers (even only as needed), please bring them with you on the day of your procedure.   Please keep follow up appointment on 06/04/21 @ 1:30pm ( follow up after CT scan.)  If you are age 50 or younger, your body mass index should be between 19-25. Your Body mass index is 30.27 kg/m. If this is out of the aformentioned range listed, please consider follow up with your Primary Care Provider.   __________________________________________________________  The [place] [facility] providers would like to encourage you to use [facility] to communicate with providers for non-urgent requests or questions.  Due to long hold times on the telephone, sending your provider a message by [facility] may be a faster and more efficient way to get a response.  Please allow 48 business hours for a response.  Please remember that this is for non-urgent requests.   Thank you for choosing me and [facility].  Dr. Rush Landmark

## 2021-04-18 ENCOUNTER — Encounter: Payer: Self-pay | Admitting: Gastroenterology

## 2021-04-18 NOTE — Progress Notes (Signed)
[place] VISIT   Primary Care Provider Ronnie Doss Coleman, DO [place] [address] [phone]  Patient Profile: Steve Romero is a 50 y.o. 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  The patient presents to the [facility] for an evaluation and management of problem(s) noted below:  Problem List 1. Pancreatic necrosis   2. Other ascites   3. Early satiety   4. Biliary stricture   5. Severe acute pancreatitis   6. Elevated alkaline phosphatase level   7. Abnormal CT of liver   8. Abnormal CT scan, gastrointestinal tract     History of Present Illness Please see prior notes for full details of HPI.  Interval History Today, the patient is accompanied by his mother (sister is working).  The patient has been doing significantly better over the course of the last few weeks since our last visit.  He has tolerated Creon supplementation has been doing well.  His JP drain site has not had any recurrence of leakage.  He has remained on diuretic therapy.  His weight has been stable to slowly increasing.  He is not having significant episodes of abdominal pain except infrequently.  He is eating healthier and believes that is a reason why the weight has not increased as significantly but he is overall feeling and doing better.  Early satiety is not as common at this time.  His bowels are semiformed to formed.  There has been a change however in regards to noticing of blood over the course the last  few weeks with his occasional bowel movements.  This is blood admixed or around the stool itself.  He has never had this previously.  He has never had a colonoscopy  GI Review of Systems Positive as above including bloating at times Negative for vomiting, dysphagia, odynophagia, melena   Review of Systems General: Denies fevers/chills/weight loss unintentionally Cardiovascular: Denies chest pain/palpitations Pulmonary: Denies shortness of breath Gastroenterological: See HPI Genitourinary: As per HPI Hematological: Denies easy bruising/bleeding Dermatological: Denies jaundice Psychological: Mood is stable but he is worried about the bleeding he has been experiencing   Medications Current Outpatient Medications  Medication Sig Dispense Refill   albuterol (VENTOLIN HFA) 108 (90 Base) MCG/ACT inhaler Inhale 2 puffs into the lungs every 6 (six) hours as needed for wheezing or shortness of breath. 8 g 0   esomeprazole (NEXIUM) 40 MG capsule Take 1 capsule (40 mg total) by mouth daily at 12 noon. 30 capsule 6   furosemide (LASIX) 20 MG tablet Take 1 tablet (20 mg total) by mouth 2 (two) times daily. 60 tablet 6   Na Sulfate-K Sulfate-Mg Sulf (SUPREP BOWEL PREP KIT) 17.5-3.13-1.6 GM/177ML SOLN Take 1 kit by mouth as directed. For colonoscopy prep 354 mL 0   spironolactone (ALDACTONE) 25 MG tablet Take 1 tablet (25 mg total) by mouth daily. 30 tablet 0   sucralfate (CARAFATE) 1 GM/10ML suspension Take 10 mLs (1 g total) by mouth 2 (two) times daily. 600 mL 3   lipase/protease/amylase (CREON) 36000 UNITS CPEP capsule 2-3 capsules with each meal and 1-2 capsules with each snack (up too 2 snacks 390 capsule 11   No current facility-administered medications  for this visit.    Allergies Allergies  Allergen Reactions   Desipramine Nausea Only and Rash    Histories Past Medical History:  Diagnosis Date   Anxiety 10-2014   Bipolar disorder [facility]) age 18   COPD (chronic obstructive pulmonary  disease) ([place]) 2016   Depression age 69   Enlarged prostate    Hyperlipidemia 2016   Hypertension 2013   Schizoaffective disorder ([place]) 11-13-14   Sleep apnea    Thyroid disease 11-2014   Past Surgical History:  Procedure Laterality Date   BALLOON DILATION N/A 11/07/2020   Procedure: BALLOON DILATION;  Surgeon: Irving Copas., MD;  Location: [place];  Service: Gastroenterology;  Laterality: N/A;   BILIARY STENT PLACEMENT N/A 10/10/2020   Procedure: BILIARY STENT PLACEMENT;  Surgeon: Rogene Houston, MD;  [facility]: [facility];  Service: Endoscopy;  Laterality: N/A;   BILIARY STENT PLACEMENT  11/09/2020   Procedure: BILIARY STENT PLACEMENT;  Surgeon: Rush Landmark Telford Nab., MD;  Location: [place];  Service: Gastroenterology;;   BILIARY STENT PLACEMENT  11/07/2020   Procedure: BILIARY STENT PLACEMENT;  Surgeon: Irving Copas., MD;  Location: [place];  Service: Gastroenterology;;   BILIARY STENT PLACEMENT  11/11/2020   Procedure: BILIARY STENT PLACEMENT;  Surgeon: Irving Copas., MD;  Location: [place];  Service: Gastroenterology;;   BILIARY STENT PLACEMENT  11/14/2020   Procedure: BILIARY STENT PLACEMENT;  Surgeon: Irving Copas., MD;  Location: [place];  Service: Gastroenterology;;   BILIARY STENT PLACEMENT N/A 01/29/2021   Procedure: BILIARY STENT PLACEMENT;  Surgeon: Irving Copas., MD;  [facility];  Service: Gastroenterology;  Laterality: N/A;   BIOPSY  11/07/2020   Procedure: BIOPSY;  Surgeon: Rush Landmark Telford Nab., MD;  Location: [place];  Service: Gastroenterology;;   BIOPSY  12/20/2020   Procedure: BIOPSY;  Surgeon: Irving Copas., MD;  Location: [facility];  Service: Gastroenterology;;   [place] RELEASE Left 02/21/2016   Procedure: CARPAL TUNNEL RELEASE;  Surgeon: Carole Civil, MD;  Location: [facility];  Service: Orthopedics;  Laterality: Left;   CHOLECYSTECTOMY N/A 10/09/2020   Procedure:  LAPAROSCOPIC CHOLECYSTECTOMY;  Surgeon: Virl Cagey, MD;  Location: [facility];  Service: General;  Laterality: N/A;   CYST ENTEROSTOMY N/A 11/07/2020   Procedure: CYST ENTEROSTOMY;  Surgeon: Irving Copas., MD;  Location: [place];  Service: Gastroenterology;  Laterality: N/A;   CYST GASTROSTOMY  11/11/2020   Procedure: CYST NECROSECTOMY;  Surgeon: Rush Landmark Telford Nab., MD;  Location: [place];  Service: Gastroenterology;;   CYST GASTROSTOMY  11/14/2020   Procedure: CYST NECROSECTOMY;  Surgeon: Irving Copas., MD;  Location: [facility];  Service: Gastroenterology;;   ENDOSCOPIC RETROGRADE CHOLANGIOPANCREATOGRAPHY (ERCP) WITH PROPOFOL N/A 11/09/2020   Procedure: ENDOSCOPIC RETROGRADE CHOLANGIOPANCREATOGRAPHY (ERCP) WITH PROPOFOL;  Surgeon: Irving Copas., MD;  Location: [place];  Service: Gastroenterology;  Laterality: N/A;   ENDOSCOPIC RETROGRADE CHOLANGIOPANCREATOGRAPHY (ERCP) WITH PROPOFOL N/A 12/20/2020   Procedure: ENDOSCOPIC RETROGRADE CHOLANGIOPANCREATOGRAPHY (ERCP) WITH PROPOFOL;  Surgeon: Rush Landmark Telford Nab., MD;  Location: [facility];  Service: Gastroenterology;  Laterality: N/A;   ENDOSCOPIC RETROGRADE CHOLANGIOPANCREATOGRAPHY (ERCP) WITH PROPOFOL N/A 01/29/2021   Procedure: ENDOSCOPIC RETROGRADE CHOLANGIOPANCREATOGRAPHY (ERCP) WITH PROPOFOL;  Surgeon: Rush Landmark Telford Nab., MD;  Location: [facility];  Service: Gastroenterology;  Laterality: N/A;   ERCP N/A 10/10/2020   Procedure: ENDOSCOPIC RETROGRADE CHOLANGIOPANCREATOGRAPHY (ERCP);  Surgeon: Rogene Houston, MD;  [facility];  Service: Endoscopy;  Laterality: N/A;   ESOPHAGOGASTRODUODENOSCOPY N/A 11/11/2020   Procedure: ESOPHAGOGASTRODUODENOSCOPY (EGD);  Surgeon: Irving Copas., MD;  Location: [place];  Service: Gastroenterology;  Laterality: N/A;   ESOPHAGOGASTRODUODENOSCOPY N/A 11/14/2020   Procedure: ESOPHAGOGASTRODUODENOSCOPY (EGD);  Surgeon: Irving Copas.,  MD;  Location: [place];  Service: Gastroenterology;  Laterality: N/A;   ESOPHAGOGASTRODUODENOSCOPY (EGD) WITH PROPOFOL N/A 11/09/2020   Procedure: ESOPHAGOGASTRODUODENOSCOPY (EGD) WITH PROPOFOL;  Surgeon: Rush Landmark Telford Nab., MD;  Location: [place];  Service: Gastroenterology;  Laterality: N/A;   ESOPHAGOGASTRODUODENOSCOPY (EGD) WITH PROPOFOL N/A 11/07/2020   Procedure: ESOPHAGOGASTRODUODENOSCOPY (EGD) WITH PROPOFOL;  Surgeon: Rush Landmark Telford Nab., MD;  Location: [place];  Service: Gastroenterology;  Laterality: N/A;   ESOPHAGOGASTRODUODENOSCOPY (EGD) WITH PROPOFOL N/A 12/20/2020   Procedure: ESOPHAGOGASTRODUODENOSCOPY (EGD) WITH PROPOFOL;  Surgeon: Rush Landmark Telford Nab., MD;  Location: [facility];  Service: Gastroenterology;  Laterality: N/A;   EUS  11/14/2020   Procedure: UPPER ENDOSCOPIC ULTRASOUND (EUS) LINEAR;  Surgeon: Rush Landmark Telford Nab., MD;  Location: [place];  Service: Gastroenterology;;   GASTROINTESTINAL STENT REMOVAL  12/20/2020   Procedure: GASTROINTESTINAL STENT REMOVAL;  Surgeon: Irving Copas., MD;  Location: [facility];  Service: Gastroenterology;;  cyst gastrostomy stent and double pig tail stents x2 removed   HERNIA REPAIR Left 2002   groin   INGUINAL HERNIA REPAIR Left 04/05/2017   Procedure: RECURRENT HERNIA REPAIR INGUINAL ADULT WITH MESH;  Surgeon: [name], MD;  [facility];  Service: General;  Laterality: Left;   MASS EXCISION Right 04/29/2020   Procedure: EXCISION MASS RIGHT WRIST;  Surgeon: [name], MD;  Location: [place];  Service: Orthopedics;  Laterality: Right;   MASS EXCISION Right 10/09/2020   Procedure: EXCISION MASS, ABDOMINAL WALL, 2CM;  Surgeon: Virl Cagey, MD;  [facility];  Service: General;  Laterality: Right;   REMOVAL OF STONES  12/20/2020   Procedure: REMOVAL OF STONES;  Surgeon: Irving Copas., MD;  Location[facility];  Service: Gastroenterology;;    Joan Mayans N/A 10/10/2020   Procedure: Joan Mayans;  Surgeon: Rogene Houston, MD;  [facility];  Service: Endoscopy;  Laterality: N/A;   SPYGLASS CHOLANGIOSCOPY N/A 12/20/2020   Procedure: SPYGLASS CHOLANGIOSCOPY;  Surgeon: Irving Copas., MD;  Location: [facility];  Service: Gastroenterology;  Laterality: N/A;   STENT REMOVAL  11/09/2020   Procedure: STENT REMOVAL;  Surgeon: Irving Copas., MD;  Location: [place];  Service: Gastroenterology;;   Lavell Islam REMOVAL  11/11/2020   Procedure: STENT REMOVAL;  Surgeon: Irving Copas., MD;  Location: [place];  Service: Gastroenterology;;   Lavell Islam REMOVAL  11/14/2020   Procedure: STENT REMOVAL;  Surgeon: Irving Copas., MD;  Location: [place];  Service: Gastroenterology;;   Lavell Islam REMOVAL  12/20/2020   Procedure: STENT REMOVAL;  Surgeon: Irving Copas., MD;  Location: [facility];  Service: Gastroenterology;;  biliary x2   UPPER ESOPHAGEAL ENDOSCOPIC ULTRASOUND (EUS) N/A 11/07/2020   Procedure: UPPER ESOPHAGEAL ENDOSCOPIC ULTRASOUND (EUS);  Surgeon: Irving Copas., MD;  Location: [place];  Service: Gastroenterology;  Laterality: N/A;   WOUND DEBRIDEMENT  11/09/2020   Procedure: CYST NECROSECTOMY;  Surgeon: Rush Landmark Telford Nab., MD;  Location: [facility];  Service: Gastroenterology;;   Social History   Socioeconomic History   Marital status: Single    Spouse name: Not on file   Number of children: Not on file   Years of education: Not on file   Highest education level: Not on file  Occupational History   Not on file  Tobacco Use   Smoking status: Every Day    Years:  26.00    Types: Cigarettes   Smokeless tobacco: Never   Tobacco comments:    4-5 cig daily as of 10/07/2020.  Vaping Use   Vaping Use: Never used  Substance and Sexual Activity   Alcohol use: No   Drug use: No   Sexual activity: Never    Birth control/protection: None  Other Topics Concern   Not on  file  Social History Narrative   Not on file   Social Determinants of Health   Financial Resource Strain: Not on file  Food Insecurity: Not on file  Transportation Needs: Not on file  Physical Activity: Not on file  Stress: Not on file  Social Connections: Not on file  Intimate Partner Violence: Not on file   Family History  Problem Relation Age of Onset   Diabetes Father    Heart disease Father    Heart disease Maternal Grandmother    Stroke Maternal Grandfather    Colon cancer Neg Hx    Esophageal cancer Neg Hx    Inflammatory bowel disease Neg Hx    Liver disease Neg Hx    Pancreatic cancer Neg Hx    Rectal cancer Neg Hx    Stomach cancer Neg Hx    I have reviewed his medical, social, and family history in detail and updated the electronic medical record as necessary.    PHYSICAL EXAMINATION  BP 130/86   Pulse 93   Ht _0  (1.753 m)   Wt 205 lb (93 kg)   SpO2 99%   BMI 30.27 kg/m  Wt Readings from Last 3 Encounters:  04/16/21 205 lb (93 kg)  03/12/21 202 lb 3.2 oz (91.7 kg)  02/25/21 203 lb (92.1 kg)  GEN: NAD, appears stated age, doesn't appear chronically ill, accompanied by sister PSYCH: Cooperative, without pressured speech EYE: Anicteric sclerae ENT: MMM CV: Nontachycardic RESP: No audible wheezing GI: NABS, soft, protuberant abdomen, tenderness to palpation in midepigastrium, without rebound or guarding  GU: DRE deferred by patient today as he is willing to move forward with colonoscopy soon MSK/EXT: No significant lower extremity edema today SKIN: No jaundice NEURO:  Alert & Oriented x 3, no focal deficits   REVIEW OF DATA  I reviewed the following data at the time of this encounter:  GI Procedures and Studies  Previously reviewed  Laboratory Studies  Reviewed those in epic  Imaging Studies  August 2022 CT abdomen pelvis with contrast IMPRESSION: 1. No substantial change in exam. 2. Persistent peripancreatic edema/inflammation extending  into the transverse mesocolon and gastrocolic ligament. There is circumferential edema/inflammation involving the descending duodenum, similar to prior with extensive edema in the anterior pararenal space of the retroperitoneum. 3. 15 x 14 mm collection of fluid and gas is identified in the body of pancreas in a region of apparent tethering/scarring towards the posterior wall the stomach. 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 4. Inflammatory changes in the pancreas markedly attenuates the portal splenic confluence with only a string sign visible. Superior mesenteric and portal veins do appear to remain patent. Patency of the splenic vein cannot be confirmed. 5. Interval decrease in intrahepatic biliary duct dilatation with common bile duct stent visualized in situ.   ASSESSMENT  Mr. Rushing is a 50 y.o. 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  The patient is seen today for evaluation and management of:  1. Pancreatic necrosis   2. Other ascites   3. Early satiety   4. Biliary stricture   5. Severe acute pancreatitis   6. Elevated alkaline phosphatase level   7. Abnormal CT of liver   8. Abnormal CT scan, gastrointestinal tract    The patient is hemodynamically stable and clinically seems to be more stable than he has been in the past.  I am hopeful that as we allow further time to pass his pancreatitis will continue to have clinical improvement and no longer be smoldering.  He seems to have done well with Creon  supplementation at this time.  I am not sure if he is going to have any further ascites at this time and when we repeat his cross-sectional imaging in a few weeks time we will get a better sense if we can decrease or stop his diuretic therapy.  Time will tell.  His inflammatory markers are the lowest that they have ever been.  His liver tests are stable.  I plan to maintain his biliary stent until we can show that there is significant improvement in the overall pancreatitis picture.  In regards to the new onset bright red blood per rectum hemorrhoidal bleeding is most likely though he has had some findings on his CT scans of potential colopathy as a result of the inflammatory process occurring in his pancreas.  He needs colon cancer screening so we will ensure nothing else is being missed.  I offered a DRE today but the patient deferred on this as he is willing to move forward with colonoscopy within the next few weeks and we will plan to move forward with that.  The risks and benefits of endoscopic evaluation were discussed with the patient; these include but are not limited to the risk of perforation, infection, bleeding, missed lesions, lack of diagnosis, severe illness requiring hospitalization, as well as anesthesia and sedation related illnesses.  The patient and/or family is agreeable to proceed.  All patient questions were answered to the best of my ability, and the patient agrees to the aforementioned plan of action with follow-up as indicated.   PLAN  Repeat cross-sectional imaging in 6 weeks Repeat labs in 6 weeks (can be done at PCP office or here) Biliary stent to remain in place until pancreatitis is has decreased significantly so we will see how the next imaging study looks Continue Lasix 40 mg daily Continue spironolactone 25 mg daily Minimize fat intake as much as possible - Baked/broiled/grilled/saut but do not fry - Continue to work on intake of protein shakes 2-3 times per  day Continue Nexium 40 mg daily Continue Carafate as needed Continue Creon - 2 to 3 capsules with each meal and 1 to 2 capsules with each snack (36,000 lipase units) Proceed with scheduling screening/diagnostic colonoscopy   Orders Placed This Encounter  Procedures   CT Abdomen Pelvis W Contrast   CBC   Comp Met (CMET)   Sedimentation rate   C-reactive protein   Ambulatory referral to Gastroenterology    New Prescriptions   NA SULFATE-K SULFATE-MG SULF (SUPREP BOWEL PREP KIT) 17.5-3.13-1.6 GM/177ML SOLN    Take 1 kit by mouth as directed. For colonoscopy prep   Modified Medications   Modified Medication Previous Medication   LIPASE/PROTEASE/AMYLASE (CREON) 36000 UNITS CPEP CAPSULE lipase/protease/amylase (  CREON) 36000 UNITS CPEP capsule      2-3 capsules with each meal and 1-2 capsules with each snack (up too 2 snacks    2 capsules with every meal 1 with every snack    Planned Follow Up No follow-ups on file.   Total Time in Face-to-Face and in Coordination of Care for patient including independent/personal interpretation/review of prior testing, medical history, examination, medication adjustment, communicating results with the patient directly, and documentation with the EHR is 30 minutes.   Justice Britain, MD [facility] Office # [phone]    5 PM addendum Patient's laboratories were evaluated and he has evidence of jaundice at this time.  Leukocytosis is the best it has been over the course of last 6 weeks.  Will initiate ciprofloxacin 500 mg twice daily as prophylaxis against cholangitis.  If the patient develops progressive fevers (greater than 100.3) or chills or worsening symptoms he will come into the hospital after calling to update [place].  He may require ERCP earlier in the next week if that is the case.  I spoke with the patient and patient's sister about this and they agree with this plan of action

## 2021-04-22 ENCOUNTER — Ambulatory Visit (AMBULATORY_SURGERY_CENTER): Payer: Medicaid Other | Admitting: Gastroenterology

## 2021-04-22 ENCOUNTER — Encounter: Payer: Self-pay | Admitting: Gastroenterology

## 2021-04-22 VITALS — BP 157/104 | HR 66 | Temp 97.5°F | Resp 20 | Ht 69.0 in | Wt 205.0 lb

## 2021-04-22 DIAGNOSIS — D122 Benign neoplasm of ascending colon: Secondary | ICD-10-CM

## 2021-04-22 DIAGNOSIS — K8689 Other specified diseases of pancreas: Secondary | ICD-10-CM

## 2021-04-22 DIAGNOSIS — K641 Second degree hemorrhoids: Secondary | ICD-10-CM

## 2021-04-22 DIAGNOSIS — D128 Benign neoplasm of rectum: Secondary | ICD-10-CM

## 2021-04-22 DIAGNOSIS — D124 Benign neoplasm of descending colon: Secondary | ICD-10-CM

## 2021-04-22 DIAGNOSIS — D125 Benign neoplasm of sigmoid colon: Secondary | ICD-10-CM

## 2021-04-22 DIAGNOSIS — R188 Other ascites: Secondary | ICD-10-CM

## 2021-04-22 DIAGNOSIS — D120 Benign neoplasm of cecum: Secondary | ICD-10-CM

## 2021-04-22 DIAGNOSIS — K921 Melena: Secondary | ICD-10-CM

## 2021-04-22 MED ORDER — HYDROCORTISONE ACETATE 25 MG RE SUPP: 25.0000 mg | Freq: Every evening | RECTAL | 1 refills | Status: DC

## 2021-04-22 MED ORDER — SODIUM CHLORIDE 0.9 % IV SOLN: 500.0000 mL | Freq: Once | INTRAVENOUS | Status: DC

## 2021-04-22 NOTE — Progress Notes (Signed)
DT VS 

## 2021-04-22 NOTE — Patient Instructions (Signed)
Handouts on hemorrhoids and polyps given to you today  Sitz baths nightly recommended for symptom relief Use FiberCon 1-2 tablets daily to keep bowel movements regular Apply Preparation H cream 1-2 times a day    YOU HAD AN ENDOSCOPIC PROCEDURE TODAY AT [place]:   Refer to the procedure report that was given to you for any specific questions about what was found during the examination.  If the procedure report does not answer your questions, please call your gastroenterologist to clarify.  If you requested that your care partner not be given the details of your procedure findings, then the procedure report has been included in a sealed envelope for you to review at your convenience later.  YOU SHOULD EXPECT: Some feelings of bloating in the abdomen. Passage of more gas than usual.  Walking can help get rid of the air that was put into your GI tract during the procedure and reduce the bloating. If you had a lower endoscopy (such as a colonoscopy or flexible sigmoidoscopy) you may notice spotting of blood in your stool or on the toilet paper. If you underwent a bowel prep for your procedure, you may not have a normal bowel movement for a few days.  Please Note:  You might notice some irritation and congestion in your nose or some drainage.  This is from the oxygen used during your procedure.  There is no need for concern and it should clear up in a day or so.  SYMPTOMS TO REPORT IMMEDIATELY:  Following lower endoscopy (colonoscopy or flexible sigmoidoscopy):  Excessive amounts of blood in the stool  Significant tenderness or worsening of abdominal pains  Swelling of the abdomen that is new, acute  Fever of 100F or higher  For urgent or emergent issues, a gastroenterologist can be reached at any hour by calling [phone]. Do not use MyChart messaging for urgent concerns.    DIET:  We do recommend a small meal at first, but then you may proceed to your regular diet.   Drink plenty of fluids but you should avoid alcoholic beverages for 24 hours.  ACTIVITY:  You should plan to take it easy for the rest of today and you should NOT DRIVE or use heavy machinery until tomorrow (because of the sedation medicines used during the test).    FOLLOW UP: Our staff will call the number listed on your records 48-72 hours following your procedure to check on you and address any questions or concerns that you may have regarding the information given to you following your procedure. If we do not reach you, we will leave a message.  We will attempt to reach you two times.  During this call, we will ask if you have developed any symptoms of COVID 19. If you develop any symptoms (ie: fever, flu-like symptoms, shortness of breath, cough etc.) before then, please call [phone].  If you test positive for Covid 19 in the 2 weeks post procedure, please call and report this information to [place].    If any biopsies were taken you will be contacted by phone or by letter within the next 1-3 weeks.  Please call us at [phone] if you have not heard about the biopsies in 3 weeks.    SIGNATURES/CONFIDENTIALITY: You and/or your care partner have signed paperwork which will be entered into your electronic medical record.  These signatures attest to the fact that that the information above on your After Visit Summary has been reviewed and  is understood.  Full responsibility of the confidentiality of this discharge information lies with you and/or your care-partner.

## 2021-04-22 NOTE — Op Note (Signed)
[place] Patient Name: Steve Romero Procedure Date: 04/22/2021 11:39 AM MRN: [id number] Endoscopist: Justice Britain , MD Age: 50 Referring MD:  Date of Birth: 10-20-1970 Gender: Male Account #: [id number] Procedure:                Colonoscopy Indications:              Screening for colorectal malignant neoplasm,                            Incidental - Hematochezia Medicines:                Monitored Anesthesia Care Procedure:                Pre-Anesthesia Assessment:                           - Prior to the procedure, a History and Physical                            was performed, and patient medications and                            allergies were reviewed. The patient's tolerance of                            previous anesthesia was also reviewed. The risks                            and benefits of the procedure and the sedation                            options and risks were discussed with the patient.                            All questions were answered, and informed consent                            was obtained. Prior Anticoagulants: The patient has                            taken no previous anticoagulant or antiplatelet                            agents. ASA Grade Assessment: III - A patient with                            severe systemic disease. After reviewing the risks                            and benefits, the patient was deemed in                            satisfactory condition to undergo the procedure.  After obtaining informed consent, the colonoscope                            was passed under direct vision. Throughout the                            procedure, the patient's blood pressure, pulse, and                            oxygen saturations were monitored continuously. The                            CF [id number] #[id number] was introduced through the anus                            and advanced to the the cecum,  identified by                            appendiceal orifice and ileocecal valve. The                            colonoscopy was performed without difficulty. The                            patient tolerated the procedure. The quality of the                            bowel preparation was fair. The ileocecal valve,                            appendiceal orifice, and rectum were photographed. Scope In: 11:48:00 AM Scope Out: 12:19:51 PM Scope Withdrawal Time: 0 hours 25 minutes 23 seconds  Total Procedure Duration: 0 hours 31 minutes 51 seconds  Findings:                 The digital rectal exam findings include                            hemorrhoids. Pertinent negatives include no                            palpable rectal lesions.                           Copious quantities of semi-liquid stool was found                            in the entire colon, interfering with                            visualization. Lavage of the area was performed                            using copious amounts, resulting in clearance with  fair visualization.                           Four sessile polyps were found in the rectum (2),                            descending colon (1) and ascending colon (1). The                            polyps were 3 to 7 mm in size. These polyps were                            removed with a cold snare. Resection and retrieval                            were complete.                           A 25 mm polyp was found in the sigmoid colon. The                            polyp was pedunculated. Preparations were made for                            mucosal resection. NBI imaging and White-light                            endoscopy was done to demarcate the borders of the                            lesion. Saline was injected to raise the lesion.                            Snare mucosal resection was performed. Resection                            and  retrieval were complete. To prevent bleeding                            after mucosal resection, three hemostatic clips                            were successfully placed (MR conditional). There                            was no bleeding at the end of the procedure.                           Diffuse granularity was found in the entire colon.                           Non-bleeding non-thrombosed external and internal  hemorrhoids were found during retroflexion, during                            perianal exam and during digital exam. The                            hemorrhoids were Grade II (internal hemorrhoids                            that prolapse but reduce spontaneously). Complications:            No immediate complications. Estimated Blood Loss:     Estimated blood loss was minimal. Impression:               - Preparation of the colon was fair even after                            extensive lavage.                           - Hemorrhoids found on digital rectal exam.                           - Four 3 to 7 mm polyps in the rectum, in the                            descending colon and in the ascending colon,                            removed with a cold snare. Resected and retrieved.                           - One 25 mm polyp in the sigmoid colon, removed                            with mucosal resection. Resected and retrieved.                            Clips (MR conditional) were placed.                           - Diffuse granularity of the entire colon was noted.                           - Non-bleeding non-thrombosed external and internal                            hemorrhoids. Recommendation:           - The patient will be observed post-procedure,                            until all discharge criteria are met.                           - Discharge  patient to home.                           - Patient has a contact number available for                             emergencies. The signs and symptoms of potential                            delayed complications were discussed with the                            patient. Return to normal activities tomorrow.                            Written discharge instructions were provided to the                            patient.                           - High fiber diet.                           - Use FiberCon 1-2 tablets PO daily.                           - Take Sitz baths QHS.                           - Use FiberCon 1 tablet PO daily.                           - Preparation H cream: Apply externally once to BID.                           - Anusol suppositories nightly for 1 week and then                            every other night until prescription is complete.                           - Await pathology results.                           - Repeat Colonoscopy within next 2-month with                            2-day preparation for adequate screening.                           - The findings and recommendations were discussed                            with the patient.                           -  The findings and recommendations were discussed                            with the patient's family. Justice Britain, MD 04/22/2021 12:28:30 PM

## 2021-04-22 NOTE — Progress Notes (Signed)
6 month recall made. # [id number]

## 2021-04-22 NOTE — Progress Notes (Signed)
GASTROENTEROLOGY PROCEDURE H&P NOTE   Primary Care Physician: [name], DO  HPI: Steve Romero is a 50 y.o. male who presents for Colonoscopy for screening and for Rectal bleeding (first procedure).  Past Medical History:  Diagnosis Date   Acute pancreatitis without necrosis or infection, unspecified    Anxiety 10/2014   Ascites    Bipolar disorder [facility]) age 12   COPD (chronic obstructive pulmonary disease) ([place]) 2016   Depression age 32   Enlarged prostate    Hyperlipidemia 2016   Hypertension 2013   Schizoaffective disorder ([place]) 11/13/2014   Sleep apnea    Does not wear c-pap, sleeps in sitting up position per pt   Thyroid disease 11/2014   Past Surgical History:  Procedure Laterality Date   BALLOON DILATION N/A 11/07/2020   Procedure: BALLOON DILATION;  Surgeon: Irving Copas., MD;  Location: [facility] ENDOSCOPY;  Service: Gastroenterology;  Laterality: N/A;   BILIARY STENT PLACEMENT N/A 10/10/2020   Procedure: BILIARY STENT PLACEMENT;  Surgeon: Rogene Houston, MD;  Location: [facility];  Service: Endoscopy;  Laterality: N/A;   BILIARY STENT PLACEMENT  11/09/2020   Procedure: BILIARY STENT PLACEMENT;  Surgeon: Rush Landmark Telford Nab., MD;  Location: [place];  Service: Gastroenterology;;   BILIARY STENT PLACEMENT  11/07/2020   Procedure: BILIARY STENT PLACEMENT;  Surgeon: Irving Copas., MD;  Location: [place];  Service: Gastroenterology;;   BILIARY STENT PLACEMENT  11/11/2020   Procedure: BILIARY STENT PLACEMENT;  Surgeon: Irving Copas., MD;  Location: [place];  Service: Gastroenterology;;   BILIARY STENT PLACEMENT  11/14/2020   Procedure: BILIARY STENT PLACEMENT;  Surgeon: Irving Copas., MD;  Location: [place];  Service: Gastroenterology;;   BILIARY STENT PLACEMENT N/A 01/29/2021   Procedure: BILIARY STENT PLACEMENT;  Surgeon: Irving Copas., MD;  Location: [facility];  Service: Gastroenterology;   Laterality: N/A;   BIOPSY  11/07/2020   Procedure: BIOPSY;  Surgeon: Rush Landmark Telford Nab., MD;  Location: [place];  Service: Gastroenterology;;   BIOPSY  12/20/2020   Procedure: BIOPSY;  Surgeon: Irving Copas., MD;  Location: [facility];  Service: Gastroenterology;;   Wilmon Pali RELEASE Left 02/21/2016   Procedure: CARPAL TUNNEL RELEASE;  Surgeon: Carole Civil, MD;  [facility];  Service: Orthopedics;  Laterality: Left;   CHOLECYSTECTOMY N/A 10/09/2020   Procedure: LAPAROSCOPIC CHOLECYSTECTOMY;  Surgeon: Virl Cagey, MD;  Location: [facility];  Service: General;  Laterality: N/A;   CYST ENTEROSTOMY N/A 11/07/2020   Procedure: CYST ENTEROSTOMY;  Surgeon: Irving Copas., MD;  Location: [place];  Service: Gastroenterology;  Laterality: N/A;   CYST GASTROSTOMY  11/11/2020   Procedure: CYST NECROSECTOMY;  Surgeon: Rush Landmark Telford Nab., MD;  Location: [place];  Service: Gastroenterology;;   CYST GASTROSTOMY  11/14/2020   Procedure: CYST NECROSECTOMY;  Surgeon: Irving Copas., MD;  Location: [place];  Service: Gastroenterology;;   ENDOSCOPIC RETROGRADE CHOLANGIOPANCREATOGRAPHY (ERCP) WITH PROPOFOL N/A 11/09/2020   Procedure: ENDOSCOPIC RETROGRADE CHOLANGIOPANCREATOGRAPHY (ERCP) WITH PROPOFOL;  Surgeon: Irving Copas., MD;  Location: [place];  Service: Gastroenterology;  Laterality: N/A;   ENDOSCOPIC RETROGRADE CHOLANGIOPANCREATOGRAPHY (ERCP) WITH PROPOFOL N/A 12/20/2020   Procedure: ENDOSCOPIC RETROGRADE CHOLANGIOPANCREATOGRAPHY (ERCP) WITH PROPOFOL;  Surgeon: Rush Landmark Telford Nab., MD;  Location: [facility];  Service: Gastroenterology;  Laterality: N/A;   ENDOSCOPIC RETROGRADE CHOLANGIOPANCREATOGRAPHY (ERCP) WITH PROPOFOL N/A 01/29/2021   Procedure: ENDOSCOPIC RETROGRADE CHOLANGIOPANCREATOGRAPHY (ERCP) WITH PROPOFOL;  Surgeon: Rush Landmark Telford Nab., MD;  Location: [facility];  Service: Gastroenterology;  Laterality: N/A;   ERCP N/A 10/10/2020   Procedure: ENDOSCOPIC RETROGRADE CHOLANGIOPANCREATOGRAPHY (ERCP);  Surgeon: Rogene Houston, MD;  [facility];  Service: Endoscopy;  Laterality: N/A;   ERCP     ESOPHAGOGASTRODUODENOSCOPY N/A 11/11/2020   Procedure: ESOPHAGOGASTRODUODENOSCOPY (EGD);  Surgeon: Irving Copas., MD;  Location: [place];  Service: Gastroenterology;  Laterality: N/A;   ESOPHAGOGASTRODUODENOSCOPY N/A 11/14/2020   Procedure: ESOPHAGOGASTRODUODENOSCOPY (EGD);  Surgeon: Irving Copas., MD;  Location: [place];  Service: Gastroenterology;  Laterality: N/A;   ESOPHAGOGASTRODUODENOSCOPY (EGD) WITH PROPOFOL N/A 11/09/2020   Procedure: ESOPHAGOGASTRODUODENOSCOPY (EGD) WITH PROPOFOL;  Surgeon: Rush Landmark Telford Nab., MD;  Location: [place];  Service: Gastroenterology;  Laterality: N/A;   ESOPHAGOGASTRODUODENOSCOPY (EGD) WITH PROPOFOL N/A 11/07/2020   Procedure: ESOPHAGOGASTRODUODENOSCOPY (EGD) WITH PROPOFOL;  Surgeon: Rush Landmark Telford Nab., MD;  Location: [place];  Service: Gastroenterology;  Laterality: N/A;   ESOPHAGOGASTRODUODENOSCOPY (EGD) WITH PROPOFOL N/A 12/20/2020   Procedure: ESOPHAGOGASTRODUODENOSCOPY (EGD) WITH PROPOFOL;  Surgeon: Rush Landmark Telford Nab., MD;  Location: [facility];  Service: Gastroenterology;  Laterality: N/A;   EUS  11/14/2020   Procedure: UPPER ENDOSCOPIC ULTRASOUND (EUS) LINEAR;  Surgeon: Rush Landmark Telford Nab., MD;  Location: [facility] ENDOSCOPY;  Service: Gastroenterology;;   GASTROINTESTINAL STENT REMOVAL  12/20/2020   Procedure: GASTROINTESTINAL STENT REMOVAL;  Surgeon: Irving Copas., MD;  Location: [facility];  Service: Gastroenterology;;  cyst gastrostomy stent and double pig tail stents x2 removed   HERNIA REPAIR Left 2002   groin   INGUINAL HERNIA REPAIR Left 04/05/2017   Procedure: RECURRENT HERNIA REPAIR INGUINAL ADULT WITH MESH;  Surgeon: [name], MD;  Location[facility];  Service: General;   Laterality: Left;   MASS EXCISION Right 04/29/2020   Procedure: EXCISION MASS RIGHT WRIST;  Surgeon: [name], MD;  Location: [place];  Service: Orthopedics;  Laterality: Right;   MASS EXCISION Right 10/09/2020   Procedure: EXCISION MASS, ABDOMINAL WALL, 2CM;  Surgeon: Virl Cagey, MD;  Location: [facility];  Service: General;  Laterality: Right;   REMOVAL OF STONES  12/20/2020   Procedure: REMOVAL OF STONES;  Surgeon: Irving Copas., MD;  Location: [facility];  Service: Gastroenterology;;   Joan Mayans N/A 10/10/2020   Procedure: Joan Mayans;  Surgeon: Rogene Houston, MD;  [facility];  Service: Endoscopy;  Laterality: N/A;   SPYGLASS CHOLANGIOSCOPY N/A 12/20/2020   Procedure: DJMEQAST CHOLANGIOSCOPY;  Surgeon: Irving Copas., MD;  Location: [facility];  Service: Gastroenterology;  Laterality: N/A;   STENT REMOVAL  11/09/2020   Procedure: STENT REMOVAL;  Surgeon: Irving Copas., MD;  Location: [place];  Service: Gastroenterology;;   Lavell Islam REMOVAL  11/11/2020   Procedure: STENT REMOVAL;  Surgeon: Irving Copas., MD;  Location: [place];  Service: Gastroenterology;;   Lavell Islam REMOVAL  11/14/2020   Procedure: STENT REMOVAL;  Surgeon: Irving Copas., MD;  Location: [place];  Service: Gastroenterology;;   Lavell Islam REMOVAL  12/20/2020   Procedure: STENT REMOVAL;  Surgeon: Irving Copas., MD;  Location: [facility];  Service: Gastroenterology;;  biliary x2   UPPER ESOPHAGEAL ENDOSCOPIC ULTRASOUND (EUS) N/A 11/07/2020   Procedure: UPPER ESOPHAGEAL ENDOSCOPIC ULTRASOUND (EUS);  Surgeon: Irving Copas., MD;  Location: [place];  Service: Gastroenterology;  Laterality: N/A;   UPPER GASTROINTESTINAL ENDOSCOPY     WOUND DEBRIDEMENT  11/09/2020   Procedure: CYST NECROSECTOMY;  Surgeon: Rush Landmark Telford Nab., MD;  Location: [place];  Service: Gastroenterology;;   Current  Outpatient Medications  Medication Sig Dispense Refill   esomeprazole (  NEXIUM) 40 MG capsule Take 1 capsule (40 mg total) by mouth daily at 12 noon. 30 capsule 6   furosemide (LASIX) 20 MG tablet Take 1 tablet (20 mg total) by mouth 2 (two) times daily. 60 tablet 6   spironolactone (ALDACTONE) 25 MG tablet Take 1 tablet (25 mg total) by mouth daily. 30 tablet 0   albuterol (VENTOLIN HFA) 108 (90 Base) MCG/ACT inhaler Inhale 2 puffs into the lungs every 6 (six) hours as needed for wheezing or shortness of breath. 8 g 0   lipase/protease/amylase (CREON) 36000 UNITS CPEP capsule 2-3 capsules with each meal and 1-2 capsules with each snack (up too 2 snacks 390 capsule 11   sucralfate (CARAFATE) 1 GM/10ML suspension Take 10 mLs (1 g total) by mouth 2 (two) times daily. (Patient not taking: Reported on 04/22/2021) 600 mL 3   Current Facility-Administered Medications  Medication Dose Route Frequency Provider Last Rate Last Admin   0.9 %  sodium chloride infusion  500 mL Intravenous Once Mansouraty, Telford Nab., MD        Current Outpatient Medications:    esomeprazole (NEXIUM) 40 MG capsule, Take 1 capsule (40 mg total) by mouth daily at 12 noon., Disp: 30 capsule, Rfl: 6   furosemide (LASIX) 20 MG tablet, Take 1 tablet (20 mg total) by mouth 2 (two) times daily., Disp: 60 tablet, Rfl: 6   spironolactone (ALDACTONE) 25 MG tablet, Take 1 tablet (25 mg total) by mouth daily., Disp: 30 tablet, Rfl: 0   albuterol (VENTOLIN HFA) 108 (90 Base) MCG/ACT inhaler, Inhale 2 puffs into the lungs every 6 (six) hours as needed for wheezing or shortness of breath., Disp: 8 g, Rfl: 0   lipase/protease/amylase (CREON) 36000 UNITS CPEP capsule, 2-3 capsules with each meal and 1-2 capsules with each snack (up too 2 snacks, Disp: 390 capsule, Rfl: 11   sucralfate (CARAFATE) 1 GM/10ML suspension, Take 10 mLs (1 g total) by mouth 2 (two) times daily. (Patient not taking: Reported on 04/22/2021), Disp: 600 mL, Rfl:  3  Current Facility-Administered Medications:    0.9 %  sodium chloride infusion, 500 mL, Intravenous, Once, Mansouraty, Telford Nab., MD Allergies  Allergen Reactions   Desipramine Nausea Only and Rash   Family History  Problem Relation Age of Onset   Diabetes Father    Heart disease Father    Heart disease Maternal Grandmother    Stroke Maternal Grandfather    Colon cancer Neg Hx    Esophageal cancer Neg Hx    Inflammatory bowel disease Neg Hx    Liver disease Neg Hx    Pancreatic cancer Neg Hx    Rectal cancer Neg Hx    Stomach cancer Neg Hx    Social History   Socioeconomic History   Marital status: Single    Spouse name: Not on file   Number of children: Not on file   Years of education: Not on file   Highest education level: Not on file  Occupational History   Not on file  Tobacco Use   Smoking status: Every Day    Years: 26.00    Types: Cigarettes   Smokeless tobacco: Never   Tobacco comments:    4-5 cig daily as of 10/07/2020.  Vaping Use   Vaping Use: Never used  Substance and Sexual Activity   Alcohol use: No   Drug use: No   Sexual activity: Never    Birth control/protection: None  Other Topics Concern   Not on file  Social History Narrative   Not on file   Social Determinants of Health   Financial Resource Strain: Not on file  Food Insecurity: Not on file  Transportation Needs: Not on file  Physical Activity: Not on file  Stress: Not on file  Social Connections: Not on file  Intimate Partner Violence: Not on file    Physical Exam: Today's Vitals   04/22/21 1037 04/22/21 1038  BP: (!) 134/95   Pulse: 70   Temp: (!) 97.5 F (36.4 C) (!) 97.5 F (36.4 C)  SpO2: 99%   Weight: 205 lb (93 kg)   Height: 5\' 9"  (1.753 m)    Body mass index is 30.27 kg/m. GEN: NAD EYE: Sclerae anicteric ENT: MMM CV: Non-tachycardic GI: Soft, NT/ND NEURO:  Alert & Oriented x 3  Lab Results: No results for input(s): WBC, HGB, HCT, PLT in the last 72  hours. BMET No results for input(s): NA, K, CL, CO2, GLUCOSE, BUN, CREATININE, CALCIUM in the last 72 hours. LFT No results for input(s): PROT, ALBUMIN, AST, ALT, ALKPHOS, BILITOT, BILIDIR, IBILI in the last 72 hours. PT/INR No results for input(s): LABPROT, INR in the last 72 hours.   Impression / Plan: This is a 50 y.o.male wwho presents for Colonoscopy for screening and for Rectal bleeding (first procedure).  The risks and benefits of endoscopic evaluation/treatment were discussed with the patient and/or family; these include but are not limited to the risk of perforation, infection, bleeding, missed lesions, lack of diagnosis, severe illness requiring hospitalization, as well as anesthesia and sedation related illnesses.  The patient's history has been reviewed, patient examined, no change in status, and deemed stable for procedure.  The patient and/or family is agreeable to proceed.    Justice Britain, MD [facility] Advanced [facility]oscopy Office # [phone]

## 2021-04-22 NOTE — Progress Notes (Signed)
Sedate, gd SR, tolerated procedure well, VSS, report to RN 

## 2021-04-24 ENCOUNTER — Telehealth: Payer: Self-pay

## 2021-04-24 NOTE — Telephone Encounter (Signed)
No answer, left message to call back later today, B.Khalie Wince RN. 

## 2021-04-24 NOTE — Telephone Encounter (Signed)
  Follow up Call-  Call back number 04/22/2021  Post procedure Call Back phone  # [phone]  Permission to leave phone message Yes  Some recent data might be hidden     Patient questions:  Do you have a fever, pain , or abdominal swelling? No. Pain Score  0 *  Have you tolerated food without any problems? Yes.    Have you been able to return to your normal activities? Yes.    Do you have any questions about your discharge instructions: Diet   No. Medications  No. Follow up visit  No.  Do you have questions or concerns about your Care? No.  Actions: * If pain score is 4 or above: No action needed, pain <4.  Have you developed a fever since your procedure? no  2.   Have you had an respiratory symptoms (SOB or cough) since your procedure? no  3.   Have you tested positive for COVID 19 since your procedure no  4.   Have you had any family members/close contacts diagnosed with the COVID 19 since your procedure?  no   If yes to any of these questions please route to Joylene John, RN and Joella Prince, RN

## 2021-04-25 ENCOUNTER — Encounter: Payer: Self-pay | Admitting: Gastroenterology

## 2021-04-29 ENCOUNTER — Other Ambulatory Visit: Payer: Self-pay

## 2021-04-29 ENCOUNTER — Ambulatory Visit (INDEPENDENT_AMBULATORY_CARE_PROVIDER_SITE_OTHER): Payer: Medicaid Other | Admitting: Family Medicine

## 2021-04-29 ENCOUNTER — Encounter: Payer: Self-pay | Admitting: Family Medicine

## 2021-04-29 VITALS — BP 130/76 | HR 68 | Temp 97.7°F | Ht 69.0 in | Wt 212.2 lb

## 2021-04-29 DIAGNOSIS — R739 Hyperglycemia, unspecified: Secondary | ICD-10-CM | POA: Diagnosis not present

## 2021-04-29 DIAGNOSIS — R1013 Epigastric pain: Secondary | ICD-10-CM

## 2021-04-29 DIAGNOSIS — K8689 Other specified diseases of pancreas: Secondary | ICD-10-CM

## 2021-04-29 DIAGNOSIS — R188 Other ascites: Secondary | ICD-10-CM

## 2021-04-29 LAB — BAYER DCA HB A1C WAIVED: HB A1C (BAYER DCA - WAIVED): 5.5 % (ref 4.8–5.6)

## 2021-04-29 MED ORDER — HYDROCODONE-ACETAMINOPHEN 5-325 MG PO TABS: 1.0000 | ORAL_TABLET | Freq: Four times a day (QID) | ORAL | 0 refills | Status: DC | PRN

## 2021-04-29 NOTE — Progress Notes (Signed)
Subjective: CC: General follow-up PCP: [name], DO QPR:[id number] Steve Romero is a 50 y.o. male presenting to clinic today for:  1.  Pancreatic necrosis with associated ascites Patient has 1 remaining stent that he anticipates will be removed soon.  He has a CT scan planned for November 16 and he will collect labs for Dr. Lilia Argue 1 week prior.  He denies any nausea, vomiting.  He feels that his appetite is good.  His urine output is good.  He denies any fevers.  He would like to have his sugar checked because of the pancreatic issue.  Sometimes he feels like he just craves sweets.  He continues to get some intermittent epigastric pain that is moderate to severe nature 1-2 times per week and asks if he can have a narcotic on hand for this pain.   ROS: Per HPI  Allergies  Allergen Reactions   Desipramine Nausea Only and Rash   Past Medical History:  Diagnosis Date   Acute pancreatitis without necrosis or infection, unspecified    Anxiety 10/2014   Ascites    Bipolar disorder [facility]) age 12   COPD (chronic obstructive pulmonary disease) ([place]) 2016   Depression age 35   Enlarged prostate    Hyperlipidemia 2016   Hypertension 2013   Schizoaffective disorder ([place]) 11/13/2014   Sleep apnea    Does not wear c-pap, sleeps in sitting up position per pt   Thyroid disease 11/2014    Current Outpatient Medications:    albuterol (VENTOLIN HFA) 108 (90 Base) MCG/ACT inhaler, Inhale 2 puffs into the lungs every 6 (six) hours as needed for wheezing or shortness of breath., Disp: 8 g, Rfl: 0   esomeprazole (NEXIUM) 40 MG capsule, Take 1 capsule (40 mg total) by mouth daily at 12 noon., Disp: 30 capsule, Rfl: 6   furosemide (LASIX) 20 MG tablet, Take 1 tablet (20 mg total) by mouth 2 (two) times daily., Disp: 60 tablet, Rfl: 6   hydrocortisone (ANUSOL-HC) 25 MG suppository, Place 1 suppository (25 mg total) rectally at bedtime. 1 suppository nightly for 1 week and then every other night  until prescription is completed, Disp: 12 suppository, Rfl: 1   lipase/protease/amylase (CREON) 36000 UNITS CPEP capsule, 2-3 capsules with each meal and 1-2 capsules with each snack (up too 2 snacks, Disp: 390 capsule, Rfl: 11   spironolactone (ALDACTONE) 25 MG tablet, Take 1 tablet (25 mg total) by mouth daily., Disp: 30 tablet, Rfl: 0   sucralfate (CARAFATE) 1 GM/10ML suspension, Take 10 mLs (1 g total) by mouth 2 (two) times daily. (Patient not taking: Reported on 04/22/2021), Disp: 600 mL, Rfl: 3 Social History   Socioeconomic History   Marital status: Single    Spouse name: Not on file   Number of children: Not on file   Years of education: Not on file   Highest education level: Not on file  Occupational History   Not on file  Tobacco Use   Smoking status: Every Day    Years: 26.00    Types: Cigarettes   Smokeless tobacco: Never   Tobacco comments:    4-5 cig daily as of 10/07/2020.  Vaping Use   Vaping Use: Never used  Substance and Sexual Activity   Alcohol use: No   Drug use: No   Sexual activity: Never    Birth control/protection: None  Other Topics Concern   Not on file  Social History Narrative   Not on file   Social Determinants of Health  Financial Resource Strain: Not on file  Food Insecurity: Not on file  Transportation Needs: Not on file  Physical Activity: Not on file  Stress: Not on file  Social Connections: Not on file  Intimate Partner Violence: Not on file   Family History  Problem Relation Age of Onset   Diabetes Father    Heart disease Father    Heart disease Maternal Grandmother    Stroke Maternal Grandfather    Colon cancer Neg Hx    Esophageal cancer Neg Hx    Inflammatory bowel disease Neg Hx    Liver disease Neg Hx    Pancreatic cancer Neg Hx    Rectal cancer Neg Hx    Stomach cancer Neg Hx     Objective: Office vital signs reviewed. BP 130/76   Pulse 68   Temp 97.7 F (36.5 C)   Ht 5\' 9"  (1.753 m)   Wt 212 lb 3.2 oz (96.3  kg)   SpO2 99%   BMI 31.34 kg/m   Physical Examination:  General: Awake, alert, chronically ill-appearing male, No acute distress HEENT: Sclera white.  No jaundice Cardio: regular rate and rhythm, S1S2 heard, no murmurs appreciated Pulm: clear to auscultation bilaterally, no wheezes, rhonchi or rales; normal work of breathing on room air GI: Distended abdomen that has some generalized tenderness.  Bowel sounds present  Assessment/ Plan: 50 y.o. male   Pancreatic necrosis  Other ascites  Epigastric pain - Plan: HYDROcodone-acetaminophen (NORCO) 5-325 MG tablet  Elevated serum glucose - Plan: Bayer DCA Hb A1c Waived  Continue follow-up with specialty as directed.  I have reached out to Dr. Rush Landmark and no additional lab work was requested today.  His abdomen certainly felt distended and this is likely secondary to the ascites.  He exhibited generalized epigastric pain which is likely related to his underlying pathology.  I have given him a short amount of Norco to have on hand for as needed use.  The national narcotic database was reviewed and there were no red flags  Elevated serum glucose will be further evaluated with A1c today  No orders of the defined types were placed in this encounter.  No orders of the defined types were placed in this encounter.    [name], DO [place] [phone]

## 2021-05-15 ENCOUNTER — Other Ambulatory Visit: Payer: Self-pay | Admitting: Gastroenterology

## 2021-05-15 ENCOUNTER — Other Ambulatory Visit: Payer: Medicaid Other

## 2021-05-15 ENCOUNTER — Other Ambulatory Visit: Payer: Self-pay

## 2021-05-15 ENCOUNTER — Telehealth: Payer: Self-pay | Admitting: Gastroenterology

## 2021-05-15 DIAGNOSIS — R188 Other ascites: Secondary | ICD-10-CM

## 2021-05-15 DIAGNOSIS — K8689 Other specified diseases of pancreas: Secondary | ICD-10-CM

## 2021-05-15 NOTE — Telephone Encounter (Signed)
FYI Looks like the labs were entered while in the office.  I did call and fix the orders so that they are resulted correctly.

## 2021-05-15 NOTE — Telephone Encounter (Signed)
Inbound call from [facility] at [facility] lab stating that the pt's lab orders does not have [facility] requisitions on them to be drawn correctly. Her best contact information is [phone]. Please advise. Thank you.

## 2021-05-16 LAB — COMPREHENSIVE METABOLIC PANEL
ALT: 45 IU/L — ABNORMAL HIGH (ref 0–44)
AST: 25 IU/L (ref 0–40)
Albumin/Globulin Ratio: 1.7 (ref 1.2–2.2)
Albumin: 4.7 g/dL (ref 4.0–5.0)
Alkaline Phosphatase: 228 IU/L — ABNORMAL HIGH (ref 44–121)
BUN/Creatinine Ratio: 14 (ref 9–20)
BUN: 12 mg/dL (ref 6–24)
Bilirubin Total: 0.4 mg/dL (ref 0.0–1.2)
CO2: 25 mmol/L (ref 20–29)
Calcium: 9.8 mg/dL (ref 8.7–10.2)
Chloride: 100 mmol/L (ref 96–106)
Creatinine, Ser: 0.84 mg/dL (ref 0.76–1.27)
Globulin, Total: 2.7 g/dL (ref 1.5–4.5)
Glucose: 103 mg/dL — ABNORMAL HIGH (ref 70–99)
Potassium: 4.4 mmol/L (ref 3.5–5.2)
Sodium: 139 mmol/L (ref 134–144)
Total Protein: 7.4 g/dL (ref 6.0–8.5)
eGFR: 106 mL/min/{1.73_m2} (ref >59)

## 2021-05-16 LAB — CBC WITH DIFFERENTIAL/PLATELET
Basophils Absolute: 0.1 10*3/uL (ref 0.0–0.2)
Basos: 1 %
EOS (ABSOLUTE): 0.4 10*3/uL (ref 0.0–0.4)
Eos: 5 %
Hematocrit: 55.1 % — ABNORMAL HIGH (ref 37.5–51.0)
Hemoglobin: 18.2 g/dL — ABNORMAL HIGH (ref 13.0–17.7)
Immature Grans (Abs): 0 10*3/uL (ref 0.0–0.1)
Immature Granulocytes: 1 %
Lymphocytes Absolute: 1.9 10*3/uL (ref 0.7–3.1)
Lymphs: 22 %
MCH: 28.3 pg (ref 26.6–33.0)
MCHC: 33 g/dL (ref 31.5–35.7)
MCV: 86 fL (ref 79–97)
Monocytes Absolute: 0.5 10*3/uL (ref 0.1–0.9)
Monocytes: 6 %
Neutrophils Absolute: 5.5 10*3/uL (ref 1.4–7.0)
Neutrophils: 65 %
Platelets: 334 10*3/uL (ref 150–450)
RBC: 6.42 x10E6/uL — ABNORMAL HIGH (ref 4.14–5.80)
RDW: 14 % (ref 11.6–15.4)
WBC: 8.4 10*3/uL (ref 3.4–10.8)

## 2021-05-16 LAB — HIGH SENSITIVITY CRP: CRP, High Sensitivity: 7.13 mg/L — ABNORMAL HIGH (ref 0.00–3.00)

## 2021-05-16 LAB — SEDIMENTATION RATE: Sed Rate: 3 mm/hr (ref 0–30)

## 2021-05-21 ENCOUNTER — Encounter (HOSPITAL_COMMUNITY): Payer: Self-pay

## 2021-05-21 ENCOUNTER — Ambulatory Visit (HOSPITAL_COMMUNITY)
Admission: RE | Admit: 2021-05-21 | Discharge: 2021-05-21 | Disposition: A | Discharge status: Home / Self Care | Payer: Medicaid Other | Source: Ambulatory Visit | Attending: Gastroenterology | Admitting: Gastroenterology

## 2021-05-21 DIAGNOSIS — R188 Other ascites: Secondary | ICD-10-CM | POA: Insufficient documentation

## 2021-05-21 DIAGNOSIS — K8689 Other specified diseases of pancreas: Secondary | ICD-10-CM | POA: Diagnosis present

## 2021-05-21 MED ORDER — SODIUM CHLORIDE (PF) 0.9 % IJ SOLN
INTRAMUSCULAR | Status: AC
  Filled 2021-05-21: qty 50

## 2021-05-21 MED ORDER — IOHEXOL 350 MG/ML SOLN
80.0000 mL | Freq: Once | INTRAVENOUS | Status: AC | PRN
  Administered 2021-05-21: 80 mL via INTRAVENOUS

## 2021-05-22 ENCOUNTER — Telehealth: Payer: Self-pay

## 2021-05-22 ENCOUNTER — Other Ambulatory Visit: Payer: Self-pay

## 2021-05-22 DIAGNOSIS — K8689 Other specified diseases of pancreas: Secondary | ICD-10-CM

## 2021-05-22 DIAGNOSIS — K862 Cyst of pancreas: Secondary | ICD-10-CM

## 2021-05-22 NOTE — Telephone Encounter (Signed)
When I

## 2021-05-22 NOTE — Telephone Encounter (Signed)
-----   Message from Irving Copas., MD sent at 05/22/2021  1:37 PM EST ----- I called and spoke with the patient's sister this afternoon. She states that her brother is doing exceedingly well and has been gaining weight and eating. We discussed the results of the CT scan which are concerning for the development of a new pancreatic pseudocyst as well as the concern for potential pancreatic duct disruption. The patient is scheduled to see me later this month. The best means of trying to further evaluate a potential pancreatic duct disruption would be an MRI/MRCP.  Patty or covering RN, please schedule this patient an MRI/MRCP within the next few weeks, at least 1 day before my upcoming clinic visit so we can have those results in.  Also, please look at Thursday, December 22 at [facility] and set up an EUS/ERCP combined procedure for 7:30 AM.  Thanks. GM

## 2021-05-22 NOTE — Telephone Encounter (Signed)
See additional phone note. 

## 2021-05-22 NOTE — Telephone Encounter (Signed)
Did not receive full message.  Was there anything that you were trying to reach out to me for? Thanks. GM

## 2021-05-23 ENCOUNTER — Other Ambulatory Visit: Payer: Self-pay

## 2021-05-23 MED ORDER — LORAZEPAM 1 MG PO TABS: ORAL_TABLET | ORAL | 0 refills | Status: DC

## 2021-05-26 ENCOUNTER — Other Ambulatory Visit: Payer: Self-pay

## 2021-05-26 DIAGNOSIS — K862 Cyst of pancreas: Secondary | ICD-10-CM

## 2021-05-26 DIAGNOSIS — K831 Obstruction of bile duct: Secondary | ICD-10-CM

## 2021-05-26 DIAGNOSIS — K8689 Other specified diseases of pancreas: Secondary | ICD-10-CM

## 2021-05-27 ENCOUNTER — Other Ambulatory Visit: Payer: Self-pay

## 2021-05-27 ENCOUNTER — Ambulatory Visit (HOSPITAL_COMMUNITY)
Admission: RE | Admit: 2021-05-27 | Discharge: 2021-05-27 | Disposition: A | Discharge status: Home / Self Care | Payer: Medicaid Other | Source: Ambulatory Visit | Attending: Gastroenterology | Admitting: Gastroenterology

## 2021-05-27 ENCOUNTER — Other Ambulatory Visit: Payer: Self-pay | Admitting: Gastroenterology

## 2021-05-27 DIAGNOSIS — K862 Cyst of pancreas: Secondary | ICD-10-CM

## 2021-05-27 DIAGNOSIS — K8689 Other specified diseases of pancreas: Secondary | ICD-10-CM | POA: Diagnosis present

## 2021-05-27 MED ORDER — GADOBUTROL 1 MMOL/ML IV SOLN
7.0000 mL | Freq: Once | INTRAVENOUS | Status: AC | PRN
  Administered 2021-05-27: 10 mL via INTRAVENOUS

## 2021-06-04 ENCOUNTER — Ambulatory Visit (INDEPENDENT_AMBULATORY_CARE_PROVIDER_SITE_OTHER): Payer: Medicaid Other | Admitting: Gastroenterology

## 2021-06-04 ENCOUNTER — Encounter: Payer: Self-pay | Admitting: Gastroenterology

## 2021-06-04 VITALS — BP 132/80 | HR 68 | Ht 69.0 in | Wt 206.2 lb

## 2021-06-04 DIAGNOSIS — Z86010 Personal history of colonic polyps: Secondary | ICD-10-CM

## 2021-06-04 DIAGNOSIS — K8689 Other specified diseases of pancreas: Secondary | ICD-10-CM | POA: Diagnosis not present

## 2021-06-04 DIAGNOSIS — R933 Abnormal findings on diagnostic imaging of other parts of digestive tract: Secondary | ICD-10-CM | POA: Diagnosis not present

## 2021-06-04 DIAGNOSIS — K862 Cyst of pancreas: Secondary | ICD-10-CM | POA: Diagnosis not present

## 2021-06-04 DIAGNOSIS — K831 Obstruction of bile duct: Secondary | ICD-10-CM | POA: Diagnosis not present

## 2021-06-04 DIAGNOSIS — R6881 Early satiety: Secondary | ICD-10-CM

## 2021-06-04 NOTE — Progress Notes (Signed)
[facility] [place] [facility] VISIT   Primary Care Provider Ronnie Doss [place], DO [place] [address] [phone]  Patient Profile: Steve Romero is a 50 y.o. male with a pmh significant for bipolar disorder, schizoaffective disorder, hypertension, hyperlipidemia, OSA, single episode of pancreatitis secondary to gallstone disease with subsequent cholecystectomy, complicated cholecystectomy with bile leak and subsequent severe post ERCP necrotizing pancreatitis (prior walled off necrosis/pseudocyst), persistent smoldering pancreatitis with complications of biliary obstruction (status post biliary stenting) and now potential recurrent pancreatic pseudocyst with concern for pancreatic duct disruption, chronic abdominal discomfort, colon polyps (TAs).  The patient presents to the [facility] for an evaluation and management of problem(s) noted below:  Problem List 1. Cyst of pancreas   2. Pancreatic duct disruption   3. Biliary obstruction   4. Abnormal CT scan, gastrointestinal tract   5. Early satiety   6. Hx of adenomatous colonic polyps     History of Present Illness Please see prior notes for full details of HPI.  Interval History Today, the patient is accompanied by his mother and sister for follow-up visit.  Patient underwent his follow-up imaging a few weeks ago with CT abdomen/pelvis which showed that he has now developed a recurrent pancreatic pseudocyst.  This was concerning for the possibility of a pancreatic duct disruption.  We sent him for an MRI/MRCP to see if we could better define a potential pancreatic duct disruption but this was not clearly seen on MRI/MRCP.  The patient states that overall he has been doing okay.  He still is having some abdominal discomfort that comes and goes.  He has had a slight decrease in his weight with some early satiety that is beginning to redevelop that is reminiscent to him of when he had a larger  cyst in place.  He denies any fevers or chills.  He continues on Creon.  He is not taking any other medications at this time.  He has not had any other issues since his colonoscopy as well in October and his plan for a 53-monthfollow-up colonoscopy based on his preparation that he had as well as the finding of multiple tubular adenomas including a large advanced adenoma.  GI Review of Systems Positive as above including bloating at times again Negative for nausea, vomiting, dysphagia, odynophagia, pyrosis, melena, hematochezia  Review of Systems General: Denies fevers/chills Cardiovascular: Denies chest pain Pulmonary: Denies shortness of breath Gastroenterological: See HPI Genitourinary: As per HPI Hematological: Denies easy bruising/bleeding Dermatological: Denies jaundice Psychological: Mood is stable   Medications Current Outpatient Medications  Medication Sig Dispense Refill   albuterol (VENTOLIN HFA) 108 (90 Base) MCG/ACT inhaler Inhale 2 puffs into the lungs every 6 (six) hours as needed for wheezing or shortness of breath. 8 g 0   lipase/protease/amylase (CREON) 36000 UNITS CPEP capsule 2-3 capsules with each meal and 1-2 capsules with each snack (up too 2 snacks 390 capsule 11   esomeprazole (NEXIUM) 40 MG capsule Take 1 capsule (40 mg total) by mouth daily at 12 noon. (Patient not taking: Reported on 06/04/2021) 30 capsule 6   No current facility-administered medications for this visit.    Allergies Allergies  Allergen Reactions   Desipramine Nausea Only and Rash    Histories Past Medical History:  Diagnosis Date   Acute pancreatitis without necrosis or infection, unspecified    Anxiety 10/2014   Ascites    Bipolar disorder ([facility] age 50  COPD (chronic obstructive pulmonary disease) ([place] 2016  Depression age 17  ° Enlarged prostate   ° Hyperlipidemia 2016  ° Hypertension 2013  ° Schizoaffective disorder (HCC) 11/13/2014  ° Sleep apnea   ° Does not wear c-pap,  sleeps in sitting up position per pt  ° Thyroid disease 11/2014  ° °Past Surgical History:  °Procedure Laterality Date  ° BALLOON DILATION N/A 11/07/2020  ° Procedure: BALLOON DILATION;  Surgeon: Mansouraty, Requan Hardge Jr., MD;  [facility];  Service: Gastroenterology;  Laterality: N/A;  ° BILIARY STENT PLACEMENT N/A 10/10/2020  ° Procedure: BILIARY STENT PLACEMENT;  Surgeon: Rehman, Najeeb U, MD;  Location: [facility];  Service: Endoscopy;  Laterality: N/A;  ° BILIARY STENT PLACEMENT  11/09/2020  ° Procedure: BILIARY STENT PLACEMENT;  Surgeon: Mansouraty, Zykeriah Mathia Jr., MD;  [facility];  Service: Gastroenterology;;  ° BILIARY STENT PLACEMENT  11/07/2020  ° Procedure: BILIARY STENT PLACEMENT;  Surgeon: Mansouraty, Tabithia Stroder Jr., MD;  [facility];  Service: Gastroenterology;;  ° BILIARY STENT PLACEMENT  11/11/2020  ° Procedure: BILIARY STENT PLACEMENT;  Surgeon: Mansouraty, Szymon Foiles Jr., MD;  [facility];  Service: Gastroenterology;;  ° BILIARY STENT PLACEMENT  11/14/2020  ° Procedure: BILIARY STENT PLACEMENT;  Surgeon: Mansouraty, Eliannah Hinde Jr., MD;  [facility];  Service: Gastroenterology;;  ° BILIARY STENT PLACEMENT N/A 01/29/2021  ° Procedure: BILIARY STENT PLACEMENT;  Surgeon: Mansouraty, Sigifredo Pignato Jr., MD;  [facility];  Service: Gastroenterology;  Laterality: N/A;  ° BIOPSY  11/07/2020  ° Procedure: BIOPSY;  Surgeon: Mansouraty, Square Jowett Jr., MD;  [facility];  Service: Gastroenterology;;  ° BIOPSY  12/20/2020  ° Procedure: BIOPSY;  Surgeon: Mansouraty, Maurina Fawaz Jr., MD;  [facility];  Service: Gastroenterology;;  ° CARPAL TUNNEL RELEASE Left 02/21/2016  ° Procedure: CARPAL TUNNEL RELEASE;  Surgeon: Stanley E Harrison, MD;  [facility]: [facility];  Service: Orthopedics;  Laterality: Left;  ° CHOLECYSTECTOMY N/A 10/09/2020  ° Procedure: LAPAROSCOPIC CHOLECYSTECTOMY;  Surgeon: Bridges, Lindsay C, MD;  [facility]: [facility];  Service: General;   Laterality: N/A;  ° CYST ENTEROSTOMY N/A 11/07/2020  ° Procedure: CYST ENTEROSTOMY;  Surgeon: Mansouraty, Mills Mitton Jr., MD;  [facility];  Service: Gastroenterology;  Laterality: N/A;  ° CYST GASTROSTOMY  11/11/2020  ° Procedure: CYST NECROSECTOMY;  Surgeon: Mansouraty, Daphney Hopke Jr., MD;  [facility];  Service: Gastroenterology;;  ° CYST GASTROSTOMY  11/14/2020  ° Procedure: CYST NECROSECTOMY;  Surgeon: Mansouraty, Brigett Estell Jr., MD;  [facility];  Service: Gastroenterology;;  ° CYST REMOVAL HAND    ° ENDOSCOPIC RETROGRADE CHOLANGIOPANCREATOGRAPHY (ERCP) WITH PROPOFOL N/A 11/09/2020  ° Procedure: ENDOSCOPIC RETROGRADE CHOLANGIOPANCREATOGRAPHY (ERCP) WITH PROPOFOL;  Surgeon: Mansouraty, Lajuan Kovaleski Jr., MD;  [facility];  Service: Gastroenterology;  Laterality: N/A;  ° ENDOSCOPIC RETROGRADE CHOLANGIOPANCREATOGRAPHY (ERCP) WITH PROPOFOL N/A 12/20/2020  ° Procedure: ENDOSCOPIC RETROGRADE CHOLANGIOPANCREATOGRAPHY (ERCP) WITH PROPOFOL;  Surgeon: Mansouraty, Keriana Sarsfield Jr., MD;  [facility];  Service: Gastroenterology;  Laterality: N/A;  ° ENDOSCOPIC RETROGRADE CHOLANGIOPANCREATOGRAPHY (ERCP) WITH PROPOFOL N/A 01/29/2021  ° Procedure: ENDOSCOPIC RETROGRADE CHOLANGIOPANCREATOGRAPHY (ERCP) WITH PROPOFOL;  Surgeon: Mansouraty, Labib Cwynar Jr., MD;  [facility];  Service: Gastroenterology;  Laterality: N/A;  ° ERCP N/A 10/10/2020  ° Procedure: ENDOSCOPIC RETROGRADE CHOLANGIOPANCREATOGRAPHY (ERCP);  Surgeon: Rehman, Najeeb U, MD;  [facility];  Service: Endoscopy;  Laterality: N/A;  ° ERCP    ° ESOPHAGOGASTRODUODENOSCOPY N/A 11/11/2020  ° Procedure: ESOPHAGOGASTRODUODENOSCOPY (EGD);  Surgeon: Mansouraty, Esias Mory Jr., MD;  [facility];  Service: Gastroenterology;  Laterality: N/A;  ° ESOPHAGOGASTRODUODENOSCOPY N/A 11/14/2020  ° Procedure: ESOPHAGOGASTRODUODENOSCOPY (EGD);    Surgeon: Mansouraty, Josilyn Shippee Jr., MD;  [facility];  Service: Gastroenterology;   Laterality: N/A;  ° ESOPHAGOGASTRODUODENOSCOPY (EGD) WITH PROPOFOL N/A 11/09/2020  ° Procedure: ESOPHAGOGASTRODUODENOSCOPY (EGD) WITH PROPOFOL;  Surgeon: Mansouraty, Ailine Hefferan Jr., MD;  [facility];  Service: Gastroenterology;  Laterality: N/A;  ° ESOPHAGOGASTRODUODENOSCOPY (EGD) WITH PROPOFOL N/A 11/07/2020  ° Procedure: ESOPHAGOGASTRODUODENOSCOPY (EGD) WITH PROPOFOL;  Surgeon: Mansouraty, Shyleigh Daughtry Jr., MD;  [facility];  Service: Gastroenterology;  Laterality: N/A;  ° ESOPHAGOGASTRODUODENOSCOPY (EGD) WITH PROPOFOL N/A 12/20/2020  ° Procedure: ESOPHAGOGASTRODUODENOSCOPY (EGD) WITH PROPOFOL;  Surgeon: Mansouraty, Aqua Denslow Jr., MD;  [facility];  Service: Gastroenterology;  Laterality: N/A;  ° EUS  11/14/2020  ° Procedure: UPPER ENDOSCOPIC ULTRASOUND (EUS) LINEAR;  Surgeon: Mansouraty, Jacinta Penalver Jr., MD;  [facility];  Service: Gastroenterology;;  ° GASTROINTESTINAL STENT REMOVAL  12/20/2020  ° Procedure: GASTROINTESTINAL STENT REMOVAL;  Surgeon: Mansouraty, Thayer Inabinet Jr., MD;  [facility];  Service: Gastroenterology;;  cyst gastrostomy stent and double pig tail stents x2 removed  ° HERNIA REPAIR Left 2002  ° groin  ° INGUINAL HERNIA REPAIR Left 04/05/2017  ° Procedure: RECURRENT HERNIA REPAIR INGUINAL ADULT WITH MESH;  Surgeon: Jenkins, Mark, MD;  [facility];  Service: General;  Laterality: Left;  ° MASS EXCISION Right 04/29/2020  ° Procedure: EXCISION MASS RIGHT WRIST;  Surgeon: Kuzma, Kevin, MD;  Location: [facility];  Service: Orthopedics;  Laterality: Right;  ° MASS EXCISION Right 10/09/2020  ° Procedure: EXCISION MASS, ABDOMINAL WALL, 2CM;  Surgeon: Bridges, Lindsay C, MD;  [facility];  Service: General;  Laterality: Right;  ° REMOVAL OF STONES  12/20/2020  ° Procedure: REMOVAL OF STONES;  Surgeon: Mansouraty, Markasia Carrol Jr., MD;  [facility];  Service: Gastroenterology;;  ° SPHINCTEROTOMY N/A 10/10/2020  ° Procedure:  SPHINCTEROTOMY;  Surgeon: Rehman, Najeeb U, MD;  [facility];  Service: Endoscopy;  Laterality: N/A;  ° SPYGLASS CHOLANGIOSCOPY N/A 12/20/2020  ° Procedure: SPYGLASS CHOLANGIOSCOPY;  Surgeon: Mansouraty, Yanique Mulvihill Jr., MD;  [facility];  Service: Gastroenterology;  Laterality: N/A;  ° STENT REMOVAL  11/09/2020  ° Procedure: STENT REMOVAL;  Surgeon: Mansouraty, Danell Verno Jr., MD;  [facility];  Service: Gastroenterology;;  ° STENT REMOVAL  11/11/2020  ° Procedure: STENT REMOVAL;  Surgeon: Mansouraty, Raad Clayson Jr., MD;  [facility];  Service: Gastroenterology;;  ° STENT REMOVAL  11/14/2020  ° Procedure: STENT REMOVAL;  Surgeon: Mansouraty, Aolani Piggott Jr., MD;  [facility];  Service: Gastroenterology;;  ° STENT REMOVAL  12/20/2020  ° Procedure: STENT REMOVAL;  Surgeon: Mansouraty, Yarden Hillis Jr., MD;  [facility];  Service: Gastroenterology;;  biliary x2  ° UPPER ESOPHAGEAL ENDOSCOPIC ULTRASOUND (EUS) N/A 11/07/2020  ° Procedure: UPPER ESOPHAGEAL ENDOSCOPIC ULTRASOUND (EUS);  Surgeon: Mansouraty, Aahna Rossa Jr., MD;  [facility];  Service: Gastroenterology;  Laterality: N/A;  ° UPPER GASTROINTESTINAL ENDOSCOPY    ° WOUND DEBRIDEMENT  11/09/2020  ° Procedure: CYST NECROSECTOMY;  Surgeon: Mansouraty, Iowa Kappes Jr., MD;  [facility];  Service: Gastroenterology;;  ° °Social History  ° °Socioeconomic History  ° Marital status: Single  °  Spouse name: Not on file  ° Number of children: Not on file  ° Years of education: Not on file  ° Highest education level: Not on file  °Occupational History  ° Not on file  °Tobacco Use  ° Smoking status: Every Day  °  Years: 26.00  °  Types: Cigarettes  ° Smokeless tobacco: Never  ° Tobacco comments:  °  4-5   cig daily as of 10/07/2020.  °Vaping Use  ° Vaping Use: Never used  °Substance and Sexual Activity  ° Alcohol use: No  ° Drug use: No  ° Sexual activity: Never  °  Birth control/protection: None  °Other Topics Concern  °  Not on file  °Social History Narrative  ° Not on file  ° °Social Determinants of Health  ° °Financial Resource Strain: Not on file  °Food Insecurity: Not on file  °Transportation Needs: Not on file  °Physical Activity: Not on file  °Stress: Not on file  °Social Connections: Not on file  °Intimate Partner Violence: Not on file  ° °Family History  °Problem Relation Age of Onset  ° Diabetes Father   ° Heart disease Father   ° Heart disease Maternal Grandmother   ° Stroke Maternal Grandfather   ° Colon cancer Neg Hx   ° Esophageal cancer Neg Hx   ° Inflammatory bowel disease Neg Hx   ° Liver disease Neg Hx   ° Pancreatic cancer Neg Hx   ° Rectal cancer Neg Hx   ° Stomach cancer Neg Hx   ° °I have reviewed his medical, social, and family history in detail and updated the electronic medical record as necessary.  ° ° °PHYSICAL EXAMINATION  °BP 132/80    Pulse 68    Ht 5' 9" (1.753 m)    Wt 206 lb 3.2 oz (93.5 kg)    BMI 30.45 kg/m²  °Wt Readings from Last 3 Encounters:  °06/04/21 206 lb 3.2 oz (93.5 kg)  °04/29/21 212 lb 3.2 oz (96.3 kg)  °04/22/21 205 lb (93 kg)  °GEN: NAD, appears stated age, doesn't appear chronically ill, accompanied by sister and mother °PSYCH: Cooperative, without pressured speech °EYE: Anicteric sclerae °ENT: MMM °CV: Nontachycardic °RESP: No audible wheezing °GI: NABS, soft, protuberant abdomen, nontender to palpation, without rebound or guarding °MSK/EXT: No significant lower extremity edema today °SKIN: No jaundice °NEURO:  Alert & Oriented x 3, no focal deficits ° ° °REVIEW OF DATA  °I reviewed the following data at the time of this encounter: ° °GI Procedures and Studies  °October 2022 colonoscopy °- Preparation of the colon was fair even after extensive lavage. °- Hemorrhoids found on digital rectal exam. °- Four 3 to 7 mm polyps in the rectum, in the descending colon and in the ascending colon, removed with a cold snare. Resected and retrieved. °- One 25 mm polyp in the sigmoid colon, removed  with mucosal resection. Resected and retrieved. Clips (MR conditional) were placed. °- Diffuse granularity of the entire colon was noted. °- Non-bleeding non-thrombosed external and internal hemorrhoids. ° °Pathology °Diagnosis °1. Surgical [P], colon, ascending, descending, rectal, polyp (4) °- TUBULAR ADENOMA (2 OF 6 FRAGMENTS) °- HYPERPLASTIC POLYP (4 OF 6 FRAGMENTS) °- NO HIGH-GRADE DYSPLASIA OR MALIGNANCY IDENTIFIED °2. Surgical [P], colon, sigmoid, polyp (1) °- MULTIPLE FRAGMENTS OF TUBULAR ADENOMA(S) °- NO HIGH-GRADE DYSPLASIA OR MALIGNANCY IDENTIFIED ° °Laboratory Studies  °Reviewed those in epic ° °Imaging Studies  °November 2022 CT abdomen pelvis with contrast °IMPRESSION: °Marked atrophy of the a pancreatic parenchyma within the head and proximal body of the pancreas with probable disruption of the °pancreatic duct centrally. Resultant development of a probable °intrapancreatic pseudocyst or dilated central pancreatic duct, less °likely an intrapancreatic region of walled-off necrosis measuring up to 8.3 cm in greatest dimension. This may be better assessed with endoscopic ultrasound and may be amenable candidate for endoscopic cyst gastrostomy. °Preserved pancreatic parenchyma within the tail   the pancreas. °Improved peripancreatic necrotic change and associated inflammatory change within the lesser sac and small bowel mesentery. Improved adjacent inflammatory changes within the duodenum though increasing mass effect secondary to the above-mentioned intrapancreatic fluid collection is noted. °Marked mass effect upon the main portal vein. Compromised portal flow may contribute to development of a focal fatty infiltration within the periportal regions of the left hepatic lobe. °Evolving small splenic infarct. ° °November 2022 MRI/MRCP °IMPRESSION: °Mild acute pancreatitis, without significant change. °8.2 cm pseudocyst in the pancreatic body, slightly increased in size from 7.6 cm previously. °Geographic  focal fatty infiltration in the central left hepatic °lobe. °Prior cholecystectomy.  No evidence of biliary obstruction. ° °ASSESSMENT  °Mr. Degeorge is a 50 y.o. male with a pmh significant for bipolar disorder, schizoaffective disorder, hypertension, hyperlipidemia, OSA, single episode of pancreatitis secondary to gallstone disease with subsequent cholecystectomy, complicated cholecystectomy with bile leak and subsequent severe post ERCP necrotizing pancreatitis (prior walled off necrosis/pseudocyst), persistent smoldering pancreatitis with complications of biliary obstruction (status post biliary stenting) and now potential recurrent pancreatic pseudocyst with concern for pancreatic duct disruption, chronic abdominal discomfort, colon polyps (TAs).  The patient is seen today for evaluation and management of: ° °1. Cyst of pancreas   °2. Pancreatic duct disruption   °3. Biliary obstruction   °4. Abnormal CT scan, gastrointestinal tract   °5. Early satiety   °6. Hx of adenomatous colonic polyps   ° °The patient is hemodynamically stable.  Clinically he remains mostly stable but unfortunately it looks like he has redeveloped a pancreatic pseudocyst.  With the redevelopment of this, it is most concerning for a pancreatic duct disruption having occurred in the setting of his pancreatitis episode earlier this year.  In regards to how the patient is doing overall, he does have symptoms of early satiety with some abdominal discomfort and so I suspect that the cyst is causing issues at this time.  Thankfully, most of the imaging shows significant improvement in the overall smoldering pancreatitis and inflammation that had been present within his abdominal cavity.  To go after this will require us to drain his current pseudocyst as well as consider an ERCP for pancreatic duct evaluation.  I have tentatively scheduled him for December to move forward with EUS cyst gastrostomy as well as ERCP with attempt at pancreatic duct  stent placement.  Pending how the patient does with the cyst gastrostomy I will consider doing the ERCP at that time versus having him come in a few weeks later after the cyst has drained completely.  Certainly is going to be at an increased risk of pancreatitis because of needing to do pancreatic duct work and with the biliary stent that is in place, we would have to remove that to have any chance of trying to get to the pancreatic duct orifice.  The risks of an EUS, including intestinal perforation, bleeding, infection, aspiration, and medication effects were discussed.  When a cystgastrostomy/cystenterostomy is performed as part of the EUS, there is an additional risk of pancreatitis at the rate of about 1-2%.  It was explained that procedure related pancreatitis is typically mild, although at times it can be severe and even life threatening.  The risks of an ERCP were discussed at length, including but not limited to the risk of perforation, bleeding, abdominal pain, post-ERCP pancreatitis (while usually mild can be severe and even life threatening). °  The risks and benefits of endoscopic evaluation were discussed with the patient; these include but are   risk of perforation, infection, bleeding, missed lesions, lack of diagnosis, severe illness requiring hospitalization, as well as anesthesia and sedation related illnesses.  The patient and/or family is agreeable to proceed.  We will have the patient present to his PCP office for laboratories 1 week before his planned procedure.  They will update [place] if things change dramatically where we may have to bring the patient into the hospital.  All patient questions were answered to the best of my ability, and the patient agrees to the aforementioned plan of action with follow-up as indicated.   PLAN  EUS for cyst gastrostomy creation and potential ERCP with pancreatic duct stenting has been scheduled for December 22 -Cyst gastrostomy at  minimum - Removal biliary stent to try and evaluate pancreatic duct orifice will need to be considered - Possible that I may just do cyst gastrostomy first and then repeat with ERCP a few weeks later but that will be determined at the time of procedure Preprocedure labs to be drawn at PCP office 1 week before planned procedures Minimize fat intake as much as possible - Baked/broiled/grilled/saut but do not fry - Continue to work on intake of protein shakes 2-3 times per day Restart Nexium 40 mg daily Continue Creon - 2 to 3 capsules with each meal and 1 to 2 capsules with each snack (36,000 lipase units) Repeat colonoscopy in next 6 months If patient worsens he will let us know and we will consider hospitalization and earlier procedures   Orders Placed This Encounter  Procedures   CBC   Comp Met (CMET)   INR/PT    New Prescriptions   No medications on file   Modified Medications   No medications on file    Planned Follow Up No follow-ups on file.   Total Time in Face-to-Face and in Coordination of Care for patient including independent/personal interpretation/review of prior testing, medical history, examination, medication adjustment, communicating results with the patient directly, and documentation with the EHR is 30 minutes.   Justice Britain, MD [facility] Office # [phone]    5 PM addendum Patient's laboratories were evaluated and he has evidence of jaundice at this time.  Leukocytosis is the best it has been over the course of last 6 weeks.  Will initiate ciprofloxacin 500 mg twice daily as prophylaxis against cholangitis.  If the patient develops progressive fevers (greater than 100.3) or chills or worsening symptoms he will come into the hospital after calling to update [place].  He may require ERCP earlier in the next week if that is the case.  I spoke with the patient and patient's sister about this and they agree with this plan  of action

## 2021-06-04 NOTE — H&P (View-Only) (Signed)
[facility] VISIT   Primary Care Provider Ronnie Doss [place], DO [place] [address] [phone]  Patient Profile: Steve Romero is a 50 y.o. male with a pmh significant for bipolar disorder, schizoaffective disorder, hypertension, hyperlipidemia, OSA, single episode of pancreatitis secondary to gallstone disease with subsequent cholecystectomy, complicated cholecystectomy with bile leak and subsequent severe post ERCP necrotizing pancreatitis (prior walled off necrosis/pseudocyst), persistent smoldering pancreatitis with complications of biliary obstruction (status post biliary stenting) and now potential recurrent pancreatic pseudocyst with concern for pancreatic duct disruption, chronic abdominal discomfort, colon polyps (TAs).  The patient presents to the [facility] for an evaluation and management of problem(s) noted below:  Problem List 1. Cyst of pancreas   2. Pancreatic duct disruption   3. Biliary obstruction   4. Abnormal CT scan, gastrointestinal tract   5. Early satiety   6. Hx of adenomatous colonic polyps     History of Present Illness Please see prior notes for full details of HPI.  Interval History Today, the patient is accompanied by his mother and sister for follow-up visit.  Patient underwent his follow-up imaging a few weeks ago with CT abdomen/pelvis which showed that he has now developed a recurrent pancreatic pseudocyst.  This was concerning for the possibility of a pancreatic duct disruption.  We sent him for an MRI/MRCP to see if we could better define a potential pancreatic duct disruption but this was not clearly seen on MRI/MRCP.  The patient states that overall he has been doing okay.  He still is having some abdominal discomfort that comes and goes.  He has had a slight decrease in his weight with some early satiety that is beginning to redevelop that is reminiscent to him of when he had a larger  cyst in place.  He denies any fevers or chills.  He continues on Creon.  He is not taking any other medications at this time.  He has not had any other issues since his colonoscopy as well in October and his plan for a 8-monthfollow-up colonoscopy based on his preparation that he had as well as the finding of multiple tubular adenomas including a large advanced adenoma.  GI Review of Systems Positive as above including bloating at times again Negative for nausea, vomiting, dysphagia, odynophagia, pyrosis, melena, hematochezia  Review of Systems General: Denies fevers/chills Cardiovascular: Denies chest pain Pulmonary: Denies shortness of breath Gastroenterological: See HPI Genitourinary: As per HPI Hematological: Denies easy bruising/bleeding Dermatological: Denies jaundice Psychological: Mood is stable   Medications Current Outpatient Medications  Medication Sig Dispense Refill   albuterol (VENTOLIN HFA) 108 (90 Base) MCG/ACT inhaler Inhale 2 puffs into the lungs every 6 (six) hours as needed for wheezing or shortness of breath. 8 g 0   lipase/protease/amylase (CREON) 36000 UNITS CPEP capsule 2-3 capsules with each meal and 1-2 capsules with each snack (up too 2 snacks 390 capsule 11   esomeprazole (NEXIUM) 40 MG capsule Take 1 capsule (40 mg total) by mouth daily at 12 noon. (Patient not taking: Reported on 06/04/2021) 30 capsule 6   No current facility-administered medications for this visit.    Allergies Allergies  Allergen Reactions   Desipramine Nausea Only and Rash    Histories Past Medical History:  Diagnosis Date   Acute pancreatitis without necrosis or infection, unspecified    Anxiety 10/2014   Ascites    Bipolar disorder ([facility] age 50  COPD (chronic obstructive pulmonary disease) ([facility] 2016  Depression age 56   Enlarged prostate    Hyperlipidemia 2016   Hypertension 2013   Schizoaffective disorder ([place]) 11/13/2014   Sleep apnea    Does not wear c-pap,  sleeps in sitting up position per pt   Thyroid disease 11/2014   Past Surgical History:  Procedure Laterality Date   BALLOON DILATION N/A 11/07/2020   Procedure: BALLOON DILATION;  Surgeon: Rush Landmark Telford Nab., MD;  Location: [place];  Service: Gastroenterology;  Laterality: N/A;   BILIARY STENT PLACEMENT N/A 10/10/2020   Procedure: BILIARY STENT PLACEMENT;  Surgeon: Rogene Houston, MD;  [facility]: [facility];  Service: Endoscopy;  Laterality: N/A;   BILIARY STENT PLACEMENT  11/09/2020   Procedure: BILIARY STENT PLACEMENT;  Surgeon: Rush Landmark Telford Nab., MD;  Location: [place];  Service: Gastroenterology;;   BILIARY STENT PLACEMENT  11/07/2020   Procedure: BILIARY STENT PLACEMENT;  Surgeon: Irving Copas., MD;  Location: [place];  Service: Gastroenterology;;   BILIARY STENT PLACEMENT  11/11/2020   Procedure: BILIARY STENT PLACEMENT;  Surgeon: Irving Copas., MD;  Location: [place];  Service: Gastroenterology;;   BILIARY STENT PLACEMENT  11/14/2020   Procedure: BILIARY STENT PLACEMENT;  Surgeon: Irving Copas., MD;  Location: [place];  Service: Gastroenterology;;   BILIARY STENT PLACEMENT N/A 01/29/2021   Procedure: BILIARY STENT PLACEMENT;  Surgeon: Irving Copas., MD;  [facility];  Service: Gastroenterology;  Laterality: N/A;   BIOPSY  11/07/2020   Procedure: BIOPSY;  Surgeon: Rush Landmark Telford Nab., MD;  Location: [place];  Service: Gastroenterology;;   BIOPSY  12/20/2020   Procedure: BIOPSY;  Surgeon: Irving Copas., MD;  Location[facility];  Service: Gastroenterology;;   Wilmon Pali RELEASE Left 02/21/2016   Procedure: CARPAL TUNNEL RELEASE;  Surgeon: Carole Civil, MD;  Location: [facility];  Service: Orthopedics;  Laterality: Left;   CHOLECYSTECTOMY N/A 10/09/2020   Procedure: LAPAROSCOPIC CHOLECYSTECTOMY;  Surgeon: Virl Cagey, MD;  Location: [facility];  Service: General;   Laterality: N/A;   CYST ENTEROSTOMY N/A 11/07/2020   Procedure: CYST ENTEROSTOMY;  Surgeon: Irving Copas., MD;  Location: [place];  Service: Gastroenterology;  Laterality: N/A;   CYST GASTROSTOMY  11/11/2020   Procedure: CYST NECROSECTOMY;  Surgeon: Rush Landmark Telford Nab., MD;  Location: [place];  Service: Gastroenterology;;   CYST GASTROSTOMY  11/14/2020   Procedure: CYST NECROSECTOMY;  Surgeon: Irving Copas., MD;  Location: [facility];  Service: Gastroenterology;;   CYST REMOVAL HAND     ENDOSCOPIC RETROGRADE CHOLANGIOPANCREATOGRAPHY (ERCP) WITH PROPOFOL N/A 11/09/2020   Procedure: ENDOSCOPIC RETROGRADE CHOLANGIOPANCREATOGRAPHY (ERCP) WITH PROPOFOL;  Surgeon: Irving Copas., MD;  Location: [place];  Service: Gastroenterology;  Laterality: N/A;   ENDOSCOPIC RETROGRADE CHOLANGIOPANCREATOGRAPHY (ERCP) WITH PROPOFOL N/A 12/20/2020   Procedure: ENDOSCOPIC RETROGRADE CHOLANGIOPANCREATOGRAPHY (ERCP) WITH PROPOFOL;  Surgeon: Rush Landmark Telford Nab., MD;  [facility]: [facility];  Service: Gastroenterology;  Laterality: N/A;   ENDOSCOPIC RETROGRADE CHOLANGIOPANCREATOGRAPHY (ERCP) WITH PROPOFOL N/A 01/29/2021   Procedure: ENDOSCOPIC RETROGRADE CHOLANGIOPANCREATOGRAPHY (ERCP) WITH PROPOFOL;  Surgeon: Rush Landmark Telford Nab., MD;  [facility];  Service: Gastroenterology;  Laterality: N/A;   ERCP N/A 10/10/2020   Procedure: ENDOSCOPIC RETROGRADE CHOLANGIOPANCREATOGRAPHY (ERCP);  Surgeon: Rogene Houston, MD;  [facility];  Service: Endoscopy;  Laterality: N/A;   ERCP     ESOPHAGOGASTRODUODENOSCOPY N/A 11/11/2020   Procedure: ESOPHAGOGASTRODUODENOSCOPY (EGD);  Surgeon: Irving Copas., MD;  Location: [place];  Service: Gastroenterology;  Laterality: N/A;   ESOPHAGOGASTRODUODENOSCOPY N/A 11/14/2020   Procedure: ESOPHAGOGASTRODUODENOSCOPY (EGD);  Surgeon: Irving Copas., MD;  Location: [place];  Service: Gastroenterology;   Laterality: N/A;   ESOPHAGOGASTRODUODENOSCOPY (EGD) WITH PROPOFOL N/A 11/09/2020   Procedure: ESOPHAGOGASTRODUODENOSCOPY (EGD) WITH PROPOFOL;  Surgeon: Rush Landmark Telford Nab., MD;  Location: [place];  Service: Gastroenterology;  Laterality: N/A;   ESOPHAGOGASTRODUODENOSCOPY (EGD) WITH PROPOFOL N/A 11/07/2020   Procedure: ESOPHAGOGASTRODUODENOSCOPY (EGD) WITH PROPOFOL;  Surgeon: Rush Landmark Telford Nab., MD;  Location: [place];  Service: Gastroenterology;  Laterality: N/A;   ESOPHAGOGASTRODUODENOSCOPY (EGD) WITH PROPOFOL N/A 12/20/2020   Procedure: ESOPHAGOGASTRODUODENOSCOPY (EGD) WITH PROPOFOL;  Surgeon: Rush Landmark Telford Nab., MD;  [facility];  Service: Gastroenterology;  Laterality: N/A;   EUS  11/14/2020   Procedure: UPPER ENDOSCOPIC ULTRASOUND (EUS) LINEAR;  Surgeon: Rush Landmark Telford Nab., MD;  Location: [facility];  Service: Gastroenterology;;   GASTROINTESTINAL STENT REMOVAL  12/20/2020   Procedure: GASTROINTESTINAL STENT REMOVAL;  Surgeon: Irving Copas., MD;  [facility];  Service: Gastroenterology;;  cyst gastrostomy stent and double pig tail stents x2 removed   HERNIA REPAIR Left 2002   groin   INGUINAL HERNIA REPAIR Left 04/05/2017   Procedure: RECURRENT HERNIA REPAIR INGUINAL ADULT WITH MESH;  Surgeon: [name], MD;  [facility];  Service: General;  Laterality: Left;   MASS EXCISION Right 04/29/2020   Procedure: EXCISION MASS RIGHT WRIST;  Surgeon: [name], MD;  Location: [place];  Service: Orthopedics;  Laterality: Right;   MASS EXCISION Right 10/09/2020   Procedure: EXCISION MASS, ABDOMINAL WALL, 2CM;  Surgeon: Virl Cagey, MD;  [facility];  Service: General;  Laterality: Right;   REMOVAL OF STONES  12/20/2020   Procedure: REMOVAL OF STONES;  Surgeon: Irving Copas., MD;  [facility];  Service: Gastroenterology;;   Joan Mayans N/A 10/10/2020   Procedure:  Joan Mayans;  Surgeon: Rogene Houston, MD;  [facility];  Service: Endoscopy;  Laterality: N/A;   SPYGLASS CHOLANGIOSCOPY N/A 12/20/2020   Procedure: DHRCBULA CHOLANGIOSCOPY;  Surgeon: Irving Copas., MD;  [facility];  Service: Gastroenterology;  Laterality: N/A;   STENT REMOVAL  11/09/2020   Procedure: STENT REMOVAL;  Surgeon: Irving Copas., MD;  Location: [place];  Service: Gastroenterology;;   Lavell Islam REMOVAL  11/11/2020   Procedure: STENT REMOVAL;  Surgeon: Irving Copas., MD;  Location: [place];  Service: Gastroenterology;;   Lavell Islam REMOVAL  11/14/2020   Procedure: STENT REMOVAL;  Surgeon: Irving Copas., MD;  Location: [place];  Service: Gastroenterology;;   Lavell Islam REMOVAL  12/20/2020   Procedure: STENT REMOVAL;  Surgeon: Irving Copas., MD;  Location[facility];  Service: Gastroenterology;;  biliary x2   UPPER ESOPHAGEAL ENDOSCOPIC ULTRASOUND (EUS) N/A 11/07/2020   Procedure: UPPER ESOPHAGEAL ENDOSCOPIC ULTRASOUND (EUS);  Surgeon: Irving Copas., MD;  Location: [place];  Service: Gastroenterology;  Laterality: N/A;   UPPER GASTROINTESTINAL ENDOSCOPY     WOUND DEBRIDEMENT  11/09/2020   Procedure: CYST NECROSECTOMY;  Surgeon: Rush Landmark Telford Nab., MD;  [facility];  Service: Gastroenterology;;   Social History   Socioeconomic History   Marital status: Single    Spouse name: Not on file   Number of children: Not on file   Years of education: Not on file   Highest education level: Not on file  Occupational History   Not on file  Tobacco Use   Smoking status: Every Day    Years: 26.00    Types: Cigarettes   Smokeless tobacco: Never   Tobacco comments:    4-5  cig daily as of 10/07/2020.  Vaping Use   Vaping Use: Never used  Substance and Sexual Activity   Alcohol use: No   Drug use: No   Sexual activity: Never    Birth control/protection: None  Other Topics Concern    Not on file  Social History Narrative   Not on file   Social Determinants of Health   Financial Resource Strain: Not on file  Food Insecurity: Not on file  Transportation Needs: Not on file  Physical Activity: Not on file  Stress: Not on file  Social Connections: Not on file  Intimate Partner Violence: Not on file   Family History  Problem Relation Age of Onset   Diabetes Father    Heart disease Father    Heart disease Maternal Grandmother    Stroke Maternal Grandfather    Colon cancer Neg Hx    Esophageal cancer Neg Hx    Inflammatory bowel disease Neg Hx    Liver disease Neg Hx    Pancreatic cancer Neg Hx    Rectal cancer Neg Hx    Stomach cancer Neg Hx    I have reviewed his medical, social, and family history in detail and updated the electronic medical record as necessary.    PHYSICAL EXAMINATION  BP 132/80    Pulse 68    Ht 5' 9"  (1.753 m)    Wt 206 lb 3.2 oz (93.5 kg)    BMI 30.45 kg/m  Wt Readings from Last 3 Encounters:  06/04/21 206 lb 3.2 oz (93.5 kg)  04/29/21 212 lb 3.2 oz (96.3 kg)  04/22/21 205 lb (93 kg)  GEN: NAD, appears stated age, doesn't appear chronically ill, accompanied by sister and mother PSYCH: Cooperative, without pressured speech EYE: Anicteric sclerae ENT: MMM CV: Nontachycardic RESP: No audible wheezing GI: NABS, soft, protuberant abdomen, nontender to palpation, without rebound or guarding MSK/EXT: No significant lower extremity edema today SKIN: No jaundice NEURO:  Alert & Oriented x 3, no focal deficits   REVIEW OF DATA  I reviewed the following data at the time of this encounter:  GI Procedures and Studies  October 2022 colonoscopy - Preparation of the colon was fair even after extensive lavage. - Hemorrhoids found on digital rectal exam. - Four 3 to 7 mm polyps in the rectum, in the descending colon and in the ascending colon, removed with a cold snare. Resected and retrieved. - One 25 mm polyp in the sigmoid colon, removed  with mucosal resection. Resected and retrieved. Clips (MR conditional) were placed. - Diffuse granularity of the entire colon was noted. - Non-bleeding non-thrombosed external and internal hemorrhoids.  Pathology Diagnosis 1. Surgical [P], colon, ascending, descending, rectal, polyp (4) - TUBULAR ADENOMA (2 OF 6 FRAGMENTS) - HYPERPLASTIC POLYP (4 OF 6 FRAGMENTS) - NO HIGH-GRADE DYSPLASIA OR MALIGNANCY IDENTIFIED 2. Surgical [P], colon, sigmoid, polyp (1) - MULTIPLE FRAGMENTS OF TUBULAR ADENOMA(S) - NO HIGH-GRADE DYSPLASIA OR MALIGNANCY IDENTIFIED  Laboratory Studies  Reviewed those in epic  Imaging Studies  November 2022 CT abdomen pelvis with contrast IMPRESSION: Marked atrophy of the a pancreatic parenchyma within the head and proximal body of the pancreas with probable disruption of the pancreatic duct centrally. Resultant development of a probable intrapancreatic pseudocyst or dilated central pancreatic duct, less likely an intrapancreatic region of walled-off necrosis measuring up to 8.3 cm in greatest dimension. This may be better assessed with endoscopic ultrasound and may be amenable candidate for endoscopic cyst gastrostomy. Preserved pancreatic parenchyma within the tail  the pancreas. Improved peripancreatic necrotic change and associated inflammatory change within the lesser sac and small bowel mesentery. Improved adjacent inflammatory changes within the duodenum though increasing mass effect secondary to the above-mentioned intrapancreatic fluid collection is noted. Marked mass effect upon the main portal vein. Compromised portal flow may contribute to development of a focal fatty infiltration within the periportal regions of the left hepatic lobe. Evolving small splenic infarct.  November 2022 MRI/MRCP IMPRESSION: Mild acute pancreatitis, without significant change. 8.2 cm pseudocyst in the pancreatic body, slightly increased in size from 7.6 cm previously. Geographic  focal fatty infiltration in the central left hepatic lobe. Prior cholecystectomy.  No evidence of biliary obstruction.  ASSESSMENT  Mr. Teal is a 50 y.o. male with a pmh significant for bipolar disorder, schizoaffective disorder, hypertension, hyperlipidemia, OSA, single episode of pancreatitis secondary to gallstone disease with subsequent cholecystectomy, complicated cholecystectomy with bile leak and subsequent severe post ERCP necrotizing pancreatitis (prior walled off necrosis/pseudocyst), persistent smoldering pancreatitis with complications of biliary obstruction (status post biliary stenting) and now potential recurrent pancreatic pseudocyst with concern for pancreatic duct disruption, chronic abdominal discomfort, colon polyps (TAs).  The patient is seen today for evaluation and management of:  1. Cyst of pancreas   2. Pancreatic duct disruption   3. Biliary obstruction   4. Abnormal CT scan, gastrointestinal tract   5. Early satiety   6. Hx of adenomatous colonic polyps    The patient is hemodynamically stable.  Clinically he remains mostly stable but unfortunately it looks like he has redeveloped a pancreatic pseudocyst.  With the redevelopment of this, it is most concerning for a pancreatic duct disruption having occurred in the setting of his pancreatitis episode earlier this year.  In regards to how the patient is doing overall, he does have symptoms of early satiety with some abdominal discomfort and so I suspect that the cyst is causing issues at this time.  Thankfully, most of the imaging shows significant improvement in the overall smoldering pancreatitis and inflammation that had been present within his abdominal cavity.  To go after this will require [place] to drain his current pseudocyst as well as consider an ERCP for pancreatic duct evaluation.  I have tentatively scheduled him for December to move forward with EUS cyst gastrostomy as well as ERCP with attempt at pancreatic duct  stent placement.  Pending how the patient does with the cyst gastrostomy I will consider doing the ERCP at that time versus having him come in a few weeks later after the cyst has drained completely.  Certainly is going to be at an increased risk of pancreatitis because of needing to do pancreatic duct work and with the biliary stent that is in place, we would have to remove that to have any chance of trying to get to the pancreatic duct orifice.  The risks of an EUS, including intestinal perforation, bleeding, infection, aspiration, and medication effects were discussed.  When a cystgastrostomy/cystenterostomy is performed as part of the EUS, there is an additional risk of pancreatitis at the rate of about 1-2%.  It was explained that procedure related pancreatitis is typically mild, although at times it can be severe and even life threatening.  The risks of an ERCP were discussed at length, including but not limited to the risk of perforation, bleeding, abdominal pain, post-ERCP pancreatitis (while usually mild can be severe and even life threatening).   The risks and benefits of endoscopic evaluation were discussed with the patient; these include but are  not limited to the risk of perforation, infection, bleeding, missed lesions, lack of diagnosis, severe illness requiring hospitalization, as well as anesthesia and sedation related illnesses.  The patient and/or family is agreeable to proceed.  We will have the patient present to his PCP office for laboratories 1 week before his planned procedure.  They will update [place] if things change dramatically where we may have to bring the patient into the hospital.  All patient questions were answered to the best of my ability, and the patient agrees to the aforementioned plan of action with follow-up as indicated.   PLAN  EUS for cyst gastrostomy creation and potential ERCP with pancreatic duct stenting has been scheduled for December 22 -Cyst gastrostomy at  minimum - Removal biliary stent to try and evaluate pancreatic duct orifice will need to be considered - Possible that I may just do cyst gastrostomy first and then repeat with ERCP a few weeks later but that will be determined at the time of procedure Preprocedure labs to be drawn at PCP office 1 week before planned procedures Minimize fat intake as much as possible - Baked/broiled/grilled/saut but do not fry - Continue to work on intake of protein shakes 2-3 times per day Restart Nexium 40 mg daily Continue Creon - 2 to 3 capsules with each meal and 1 to 2 capsules with each snack (36,000 lipase units) Repeat colonoscopy in next 6 months If patient worsens he will let us know and we will consider hospitalization and earlier procedures   Orders Placed This Encounter  Procedures   CBC   Comp Met (CMET)   INR/PT    New Prescriptions   No medications on file   Modified Medications   No medications on file    Planned Follow Up No follow-ups on file.   Total Time in Face-to-Face and in Coordination of Care for patient including independent/personal interpretation/review of prior testing, medical history, examination, medication adjustment, communicating results with the patient directly, and documentation with the EHR is 30 minutes.   Justice Britain, MD [facility] Office # [phone]    5 PM addendum Patient's laboratories were evaluated and he has evidence of jaundice at this time.  Leukocytosis is the best it has been over the course of last 6 weeks.  Will initiate ciprofloxacin 500 mg twice daily as prophylaxis against cholangitis.  If the patient develops progressive fevers (greater than 100.3) or chills or worsening symptoms he will come into the hospital after calling to update [place].  He may require ERCP earlier in the next week if that is the case.  I spoke with the patient and patient's sister about this and they agree with this plan  of action

## 2021-06-04 NOTE — Patient Instructions (Signed)
You have been given an order for labs to take with you to Dr. Lajuana Ripple office to have done.   If you are age 50 or older, your body mass index should be between 23-30. Your Body mass index is 30.45 kg/m. If this is out of the aforementioned range listed, please consider follow up with your Primary Care Provider.  If you are age 32 or younger, your body mass index should be between 19-25. Your Body mass index is 30.45 kg/m. If this is out of the aformentioned range listed, please consider follow up with your Primary Care Provider.   ________________________________________________________  The [facility] providers would like to encourage you to use [facility] to communicate with providers for non-urgent requests or questions.  Due to long hold times on the telephone, sending your provider a message by [facility] may be a faster and more efficient way to get a response.  Please allow 48 business hours for a response.  Please remember that this is for non-urgent requests.  _______________________________________________________  Thank you for choosing me and [facility].  Dr. Rush Landmark

## 2021-06-16 ENCOUNTER — Encounter (HOSPITAL_COMMUNITY): Payer: Self-pay | Admitting: Gastroenterology

## 2021-06-17 NOTE — Progress Notes (Signed)
Attempted to obtain medical history via telephone, unable to reach at this time. I left a voicemail to return pre surgical testing department's phone call.  

## 2021-06-19 ENCOUNTER — Other Ambulatory Visit: Payer: Medicaid Other

## 2021-06-19 DIAGNOSIS — K8689 Other specified diseases of pancreas: Secondary | ICD-10-CM

## 2021-06-20 LAB — COMPREHENSIVE METABOLIC PANEL
ALT: 14 IU/L (ref 0–44)
AST: 9 IU/L (ref 0–40)
Albumin/Globulin Ratio: 1.6 (ref 1.2–2.2)
Albumin: 4.1 g/dL (ref 4.0–5.0)
Alkaline Phosphatase: 206 IU/L — ABNORMAL HIGH (ref 44–121)
BUN/Creatinine Ratio: 12 (ref 9–20)
BUN: 13 mg/dL (ref 6–24)
Bilirubin Total: <0.2 mg/dL (ref 0.0–1.2)
CO2: 24 mmol/L (ref 20–29)
Calcium: 9.5 mg/dL (ref 8.7–10.2)
Chloride: 103 mmol/L (ref 96–106)
Creatinine, Ser: 1.09 mg/dL (ref 0.76–1.27)
Globulin, Total: 2.5 g/dL (ref 1.5–4.5)
Glucose: 104 mg/dL — ABNORMAL HIGH (ref 70–99)
Potassium: 4.1 mmol/L (ref 3.5–5.2)
Sodium: 143 mmol/L (ref 134–144)
Total Protein: 6.6 g/dL (ref 6.0–8.5)
eGFR: 83 mL/min/{1.73_m2} (ref >59)

## 2021-06-20 LAB — CBC WITH DIFFERENTIAL/PLATELET
Basophils Absolute: 0.1 10*3/uL (ref 0.0–0.2)
Basos: 1 %
EOS (ABSOLUTE): 0.2 10*3/uL (ref 0.0–0.4)
Eos: 2 %
Hematocrit: 47.9 % (ref 37.5–51.0)
Hemoglobin: 17 g/dL (ref 13.0–17.7)
Immature Grans (Abs): 0 10*3/uL (ref 0.0–0.1)
Immature Granulocytes: 0 %
Lymphocytes Absolute: 1.9 10*3/uL (ref 0.7–3.1)
Lymphs: 21 %
MCH: 30.2 pg (ref 26.6–33.0)
MCHC: 35.5 g/dL (ref 31.5–35.7)
MCV: 85 fL (ref 79–97)
Monocytes Absolute: 0.5 10*3/uL (ref 0.1–0.9)
Monocytes: 6 %
Neutrophils Absolute: 6.5 10*3/uL (ref 1.4–7.0)
Neutrophils: 70 %
Platelets: 311 10*3/uL (ref 150–450)
RBC: 5.62 x10E6/uL (ref 4.14–5.80)
RDW: 13.6 % (ref 11.6–15.4)
WBC: 9.3 10*3/uL (ref 3.4–10.8)

## 2021-06-20 LAB — SEDIMENTATION RATE: Sed Rate: 6 mm/hr (ref 0–30)

## 2021-06-20 LAB — HIGH SENSITIVITY CRP: CRP, High Sensitivity: 33.25 mg/L — ABNORMAL HIGH (ref 0.00–3.00)

## 2021-06-24 ENCOUNTER — Encounter (HOSPITAL_COMMUNITY): Payer: Self-pay | Admitting: Gastroenterology

## 2021-06-24 NOTE — Progress Notes (Signed)
Spoke with pt for pre-op call. Pt denies cardiac history. Pt states he has been treated in the past for HTN, but has not had to have medications for quite awhile. Pt states he is not diabetic.  EKG - 12/20/20 - Epic Echo - 03/03/21 - Epic  Labs - 06/19/21 (CBC with diff, CMP) - Epic  Pt's surgery is scheduled as ambulatory so no Covid test is required prior to surgery.

## 2021-06-26 ENCOUNTER — Ambulatory Visit (HOSPITAL_COMMUNITY): Payer: Medicaid Other

## 2021-06-26 ENCOUNTER — Ambulatory Visit (HOSPITAL_COMMUNITY)
Admission: RE | Admit: 2021-06-26 | Discharge: 2021-06-26 | Disposition: A | Discharge status: Home / Self Care | Payer: Medicaid Other | Source: Ambulatory Visit | Attending: Gastroenterology | Admitting: Gastroenterology

## 2021-06-26 ENCOUNTER — Ambulatory Visit (HOSPITAL_COMMUNITY): Payer: Medicaid Other | Admitting: Anesthesiology

## 2021-06-26 ENCOUNTER — Encounter (HOSPITAL_COMMUNITY)
Admission: RE | Disposition: A | Discharge status: Home / Self Care | Payer: Self-pay | Source: Ambulatory Visit | Attending: Gastroenterology

## 2021-06-26 ENCOUNTER — Encounter (HOSPITAL_COMMUNITY): Payer: Self-pay | Admitting: Gastroenterology

## 2021-06-26 ENCOUNTER — Other Ambulatory Visit: Payer: Self-pay

## 2021-06-26 DIAGNOSIS — K2980 Duodenitis without bleeding: Secondary | ICD-10-CM | POA: Diagnosis not present

## 2021-06-26 DIAGNOSIS — E785 Hyperlipidemia, unspecified: Secondary | ICD-10-CM | POA: Diagnosis not present

## 2021-06-26 DIAGNOSIS — K2970 Gastritis, unspecified, without bleeding: Secondary | ICD-10-CM | POA: Diagnosis not present

## 2021-06-26 DIAGNOSIS — F319 Bipolar disorder, unspecified: Secondary | ICD-10-CM | POA: Diagnosis not present

## 2021-06-26 DIAGNOSIS — I899 Noninfective disorder of lymphatic vessels and lymph nodes, unspecified: Secondary | ICD-10-CM | POA: Insufficient documentation

## 2021-06-26 DIAGNOSIS — F419 Anxiety disorder, unspecified: Secondary | ICD-10-CM | POA: Diagnosis not present

## 2021-06-26 DIAGNOSIS — K3189 Other diseases of stomach and duodenum: Secondary | ICD-10-CM | POA: Diagnosis not present

## 2021-06-26 DIAGNOSIS — K8689 Other specified diseases of pancreas: Secondary | ICD-10-CM

## 2021-06-26 DIAGNOSIS — F17210 Nicotine dependence, cigarettes, uncomplicated: Secondary | ICD-10-CM | POA: Insufficient documentation

## 2021-06-26 DIAGNOSIS — K862 Cyst of pancreas: Secondary | ICD-10-CM | POA: Insufficient documentation

## 2021-06-26 DIAGNOSIS — J449 Chronic obstructive pulmonary disease, unspecified: Secondary | ICD-10-CM | POA: Insufficient documentation

## 2021-06-26 DIAGNOSIS — K863 Pseudocyst of pancreas: Secondary | ICD-10-CM | POA: Diagnosis not present

## 2021-06-26 DIAGNOSIS — K2289 Other specified disease of esophagus: Secondary | ICD-10-CM | POA: Diagnosis not present

## 2021-06-26 DIAGNOSIS — G4733 Obstructive sleep apnea (adult) (pediatric): Secondary | ICD-10-CM | POA: Insufficient documentation

## 2021-06-26 DIAGNOSIS — K831 Obstruction of bile duct: Secondary | ICD-10-CM

## 2021-06-26 DIAGNOSIS — K859 Acute pancreatitis without necrosis or infection, unspecified: Secondary | ICD-10-CM | POA: Diagnosis not present

## 2021-06-26 DIAGNOSIS — I10 Essential (primary) hypertension: Secondary | ICD-10-CM | POA: Diagnosis not present

## 2021-06-26 DIAGNOSIS — Z419 Encounter for procedure for purposes other than remedying health state, unspecified: Secondary | ICD-10-CM

## 2021-06-26 HISTORY — PX: PANCREATIC STENT PLACEMENT: SHX5539

## 2021-06-26 HISTORY — PX: ESOPHAGOGASTRODUODENOSCOPY (EGD) WITH PROPOFOL: SHX5813

## 2021-06-26 HISTORY — PX: CYSTOSCOPY: SHX5120

## 2021-06-26 HISTORY — PX: BALLOON DILATION: SHX5330

## 2021-06-26 HISTORY — PX: BIOPSY: SHX5522

## 2021-06-26 HISTORY — PX: EUS: SHX5427

## 2021-06-26 HISTORY — PX: BILIARY STENT PLACEMENT: SHX5538

## 2021-06-26 SURGERY — UPPER ENDOSCOPIC ULTRASOUND (EUS) RADIAL
Anesthesia: General

## 2021-06-26 MED ORDER — HYDROMORPHONE HCL 1 MG/ML IJ SOLN: 0.2500 mg | INTRAMUSCULAR | Status: DC | PRN

## 2021-06-26 MED ORDER — PROPOFOL 10 MG/ML IV BOLUS
INTRAVENOUS | Status: DC | PRN
  Administered 2021-06-26: 150 mg via INTRAVENOUS

## 2021-06-26 MED ORDER — LIDOCAINE 2% (20 MG/ML) 5 ML SYRINGE
INTRAMUSCULAR | Status: DC | PRN
Start: 2021-06-26 — End: 2021-06-26
  Administered 2021-06-26: 60 mg via INTRAVENOUS

## 2021-06-26 MED ORDER — ROCURONIUM BROMIDE 10 MG/ML (PF) SYRINGE
PREFILLED_SYRINGE | INTRAVENOUS | Status: DC | PRN
  Administered 2021-06-26: 20 mg via INTRAVENOUS
  Administered 2021-06-26: 80 mg via INTRAVENOUS

## 2021-06-26 MED ORDER — GLUCAGON HCL RDNA (DIAGNOSTIC) 1 MG IJ SOLR
INTRAMUSCULAR | Status: AC
  Filled 2021-06-26: qty 1

## 2021-06-26 MED ORDER — SODIUM CHLORIDE 0.9 % IV SOLN: INTRAVENOUS | Status: DC

## 2021-06-26 MED ORDER — AMISULPRIDE (ANTIEMETIC) 5 MG/2ML IV SOLN: 10.0000 mg | Freq: Once | INTRAVENOUS | Status: DC | PRN

## 2021-06-26 MED ORDER — SUGAMMADEX SODIUM 200 MG/2ML IV SOLN
INTRAVENOUS | Status: DC | PRN
  Administered 2021-06-26: 300 mg via INTRAVENOUS

## 2021-06-26 MED ORDER — OXYCODONE HCL 5 MG/5ML PO SOLN: 5.0000 mg | Freq: Once | ORAL | Status: DC | PRN

## 2021-06-26 MED ORDER — INDOMETHACIN 50 MG RE SUPP
RECTAL | Status: AC
  Filled 2021-06-26: qty 2

## 2021-06-26 MED ORDER — ONDANSETRON HCL 4 MG/2ML IJ SOLN
INTRAMUSCULAR | Status: DC | PRN
  Administered 2021-06-26: 4 mg via INTRAVENOUS

## 2021-06-26 MED ORDER — MIDAZOLAM HCL 2 MG/2ML IJ SOLN
INTRAMUSCULAR | Status: DC | PRN
  Administered 2021-06-26: 2 mg via INTRAVENOUS

## 2021-06-26 MED ORDER — CIPROFLOXACIN IN D5W 400 MG/200ML IV SOLN
INTRAVENOUS | Status: DC | PRN
  Administered 2021-06-26: 400 mg via INTRAVENOUS

## 2021-06-26 MED ORDER — PHENYLEPHRINE 40 MCG/ML (10ML) SYRINGE FOR IV PUSH (FOR BLOOD PRESSURE SUPPORT)
PREFILLED_SYRINGE | INTRAVENOUS | Status: DC | PRN
  Administered 2021-06-26: 60 ug via INTRAVENOUS
  Administered 2021-06-26 (×2): 80 ug via INTRAVENOUS
  Administered 2021-06-26: 60 ug via INTRAVENOUS
  Administered 2021-06-26: 80 ug via INTRAVENOUS
  Administered 2021-06-26: 40 ug via INTRAVENOUS

## 2021-06-26 MED ORDER — PROMETHAZINE HCL 25 MG/ML IJ SOLN: 6.2500 mg | INTRAMUSCULAR | Status: DC | PRN

## 2021-06-26 MED ORDER — OXYCODONE HCL 5 MG PO TABS: 5.0000 mg | ORAL_TABLET | Freq: Once | ORAL | Status: DC | PRN

## 2021-06-26 MED ORDER — LACTATED RINGERS IV SOLN
INTRAVENOUS | Status: AC | PRN
  Administered 2021-06-26: 20 mL/h via INTRAVENOUS

## 2021-06-26 MED ORDER — FENTANYL CITRATE (PF) 250 MCG/5ML IJ SOLN
INTRAMUSCULAR | Status: DC | PRN
  Administered 2021-06-26 (×2): 50 ug via INTRAVENOUS

## 2021-06-26 MED ORDER — DEXAMETHASONE SODIUM PHOSPHATE 10 MG/ML IJ SOLN
INTRAMUSCULAR | Status: DC | PRN
  Administered 2021-06-26: 10 mg via INTRAVENOUS

## 2021-06-26 MED ORDER — CIPROFLOXACIN IN D5W 400 MG/200ML IV SOLN
INTRAVENOUS | Status: AC
  Filled 2021-06-26: qty 200

## 2021-06-26 NOTE — Anesthesia Procedure Notes (Signed)
Procedure Name: Intubation Date/Time: 06/26/2021 7:40 AM Performed by: Mariea Clonts, CRNA Pre-anesthesia Checklist: Patient identified, Emergency Drugs available, Suction available and Patient being monitored Patient Re-evaluated:Patient Re-evaluated prior to induction Oxygen Delivery Method: Circle System Utilized Preoxygenation: Pre-oxygenation with 100% oxygen Induction Type: IV induction Ventilation: Mask ventilation without difficulty Laryngoscope Size: Miller and 2 Grade View: Grade I Tube type: Oral Tube size: 7.5 mm Number of attempts: 1 Airway Equipment and Method: Stylet and Oral airway Placement Confirmation: ETT inserted through vocal cords under direct vision, positive ETCO2 and breath sounds checked- equal and bilateral Tube secured with: Tape Dental Injury: Teeth and Oropharynx as per pre-operative assessment

## 2021-06-26 NOTE — Anesthesia Preprocedure Evaluation (Addendum)
Anesthesia Evaluation  Patient identified by MRN, date of birth, ID band Patient awake    Reviewed: Allergy & Precautions, NPO status , Patient's Chart, lab work & pertinent test results  Airway Mallampati: I  TM Distance: >3 FB Neck ROM: Full    Dental no notable dental hx. (+) Edentulous Upper, Edentulous Lower   Pulmonary sleep apnea , COPD,  COPD inhaler, Current Smoker and Patient abstained from smoking.,    Pulmonary exam normal breath sounds clear to auscultation       Cardiovascular hypertension, Pt. on medications Normal cardiovascular exam Rhythm:Regular Rate:Normal  10/25/20 TTE 1. Poor acoustic windows limit study.  2. Cannot fully evaluate regional wall motion as endocardium is difficult  to see.. Left ventricular ejection fraction, by estimation, is >75%. The  left ventricle has hyperdynamic function. There is mild left ventricular  hypertrophy. Left ventricular  diastolic parameters were normal.  3. Right ventricular systolic function is normal. The right ventricular  size is normal.  4. The mitral valve is normal in structure. Trivial mitral valve  regurgitation.  5. The aortic valve is abnormal. Aortic valve regurgitation is not  visualized. Mild aortic valve sclerosis is present, with no evidence of  aortic valve stenosis.  6. The inferior vena cava is normal in size with greater than 50%  respiratory variability, suggesting right atrial pressure of 3 mmHg.    Neuro/Psych Anxiety Depression Bipolar Disorder  Neuromuscular disease    GI/Hepatic Neg liver ROS, Billiary obstruction   Endo/Other  negative endocrine ROS  Renal/GU      Musculoskeletal   Abdominal   Peds  Hematology   Anesthesia Other Findings All : Desipramine   Reproductive/Obstetrics                            Anesthesia Physical  Anesthesia Plan  ASA: 3  Anesthesia Plan: General   Post-op Pain  Management:    Induction: Intravenous  PONV Risk Score and Plan: 1 and Treatment may vary due to age or medical condition and Ondansetron  Airway Management Planned: Oral ETT  Additional Equipment: None  Intra-op Plan:   Post-operative Plan: Extubation in OR  Informed Consent: I have reviewed the patients History and Physical, chart, labs and discussed the procedure including the risks, benefits and alternatives for the proposed anesthesia with the patient or authorized representative who has indicated his/her understanding and acceptance.     Dental advisory given  Plan Discussed with: CRNA and Anesthesiologist  Anesthesia Plan Comments:         Anesthesia Quick Evaluation

## 2021-06-26 NOTE — Interval H&P Note (Signed)
History and Physical Interval Note:  06/26/2021 7:30 AM  Steve Romero  has presented today for surgery, with the diagnosis of pancreatic necrosis, bile duct obstruction.  The various methods of treatment have been discussed with the patient and family. After consideration of risks, benefits and other options for treatment, the patient has consented to  Procedure(s): UPPER ENDOSCOPIC ULTRASOUND (EUS) RADIAL (N/A) ENDOSCOPIC RETROGRADE CHOLANGIOPANCREATOGRAPHY (ERCP) WITH PROPOFOL (N/A) as a surgical intervention.  The patient's history has been reviewed, patient examined, no change in status, stable for surgery.  I have reviewed the patient's chart and labs.  Questions were answered to the patient's satisfaction.    Likely will move forward with just EUS so that anatomical distortion and ability to cannulate pancreatic duct may be easier after cyst aspiration/enterostomy with drainage.  Will see how things look and make final decision in case.   The risks of an EUS including intestinal perforation, bleeding, infection, aspiration, and medication effects were discussed as was the possibility it may not give a definitive diagnosis if a biopsy is performed.  When a biopsy of the pancreas is done as part of the EUS, there is an additional risk of pancreatitis at the rate of about 1-2%.  It was explained that procedure related pancreatitis is typically mild, although it can be severe and even life threatening, which is why we do not perform random pancreatic biopsies and only biopsy a lesion/area we feel is concerning enough to warrant the risk.  The risks of an ERCP were discussed at length, including but not limited to the risk of perforation, bleeding, abdominal pain, post-ERCP pancreatitis (while usually mild can be severe and even life threatening).   [employer]

## 2021-06-26 NOTE — Op Note (Signed)
[facility] Patient Name: Steve Romero Procedure Date : 06/26/2021 MRN: [id number] Attending MD: Justice Britain , MD Date of Birth: 05-14-1971 CSN: [id number] Age: 50 Admit Type: Outpatient Procedure:                Upper EUS Indications:              Pancreatic cyst, Pancreatic pseudocyst, Epigastric                            abdominal pain, Early satiety, Weight loss Providers:                Justice Britain, MD, Grace Isaac, RN, Benetta Spar, Technician, Virgilio Belling. Huel Cote, CRNA Referring MD:             Koleen Distance. [facility] Medicines:                Monitored Anesthesia Care Complications:            No immediate complications. Estimated Blood Loss:     Estimated blood loss was minimal. Procedure:                Pre-Anesthesia Assessment:                           - Prior to the procedure, a History and Physical                            was performed, and patient medications and                            allergies were reviewed. The patient's tolerance of                            previous anesthesia was also reviewed. The risks                            and benefits of the procedure and the sedation                            options and risks were discussed with the patient.                            All questions were answered, and informed consent                            was obtained. Prior Anticoagulants: The patient has                            taken no previous anticoagulant or antiplatelet                            agents. ASA Grade Assessment: III - A patient with  severe systemic disease. After reviewing the risks                            and benefits, the patient was deemed in                            satisfactory condition to undergo the procedure.                           After obtaining informed consent, the endoscope was                             passed under direct vision. Throughout the                            procedure, the patient's blood pressure, pulse, and                            oxygen saturations were monitored continuously. The                            [id number]-[id number] ([id number]) Olympus endoscope was                            introduced through the mouth, and advanced to the                            second part of duodenum. The [id number]-[id number] ([id number])                            Olympus duodenoscope was introduced through the                            mouth, and advanced to the area of papilla. The                            [id number] ([id number]) Olympus linear ultrasound scope                            was introduced through the mouth, and advanced to                            the duodenum for ultrasound examination from the                            stomach and duodenum. The upper EUS was                            accomplished without difficulty. The patient                            tolerated the procedure. Scope In: Scope Out: Findings:      ENDOSCOPIC FINDING: :      White nummular lesions were noted in  the mid esophagus and in the distal       esophagus. Biopsies were taken with a cold forceps for histology.      The Z-line was irregular and was found 44 cm from the incisors.      A medium post-AXIOS cystgastrostomy scar was found at the incisura. The       scar tissue was healthy in appearance.      Patchy moderate inflammation characterized by erosions, erythema and       granularity was found in the entire examined stomach. Biopsies were       taken with a cold forceps for histology and Helicobacter pylori testing.      Diffuse moderately erythematous mucosa without active bleeding and with       no stigmata of bleeding was found in the duodenal bulb, in the first       portion of the duodenum and in the second portion of the duodenum.      Mild inflammation characterized by congestion (edema) was found at  the       major papilla. The biliary stent has migrated inwards.      ENDOSONOGRAPHIC FINDING: :      An anechoic lesion suggestive of a cyst was identified in the pancreatic       head and genu of the pancreas. It is not in obvious communication with       the pancreatic duct. The lesion measured 81 mm by 70 mm in maximal       cross-sectional diameter. There was a single compartment without septae.       The outer wall of the lesion was thick. There was no associated mass.       There was no internal debris within the fluid-filled cavity. The       decision was made to create a cystogastrostomy using the AXIOS stent       system. Once an appropriate position in the stomach was identified, the       common wall between the stomach and the cyst was interrogated utilizing       color Doppler imaging to identify interposed vessels. The stomach wall       and the cyst were punctured under endosonographic guidance using the       AXIOS stent and electrocautery device which was introduced through the       working channel. Current was applied to the cautery tip. The AXIOS       device was advanced into the cyst, and a 15 x 10 mm AXIOS stent was       placed with the flanges in close approximation to the walls of the cyst       and the stomach through the cystogastrostomy. The stent was successfully       placed. Suction via Endoscope was performed removed approximately 250 mL       of clear fluid. A 5 cm 7 Fr double pigtail stent was placed into the       pseudocyst through the AXIOS cystogastrostomy. The stent was       successfully placed.      One stent was visualized endosonographically in the common bile duct.       Extension of the stent was noted in the common bile duct and this was       compressed by the aforementioned cyst.      No malignant-appearing lymph nodes were visualized in the perigastric  region and peripancreatic region.      The celiac region was  visualized. Impression:               EGD Impression:                           - White nummular lesions in esophageal mucosa.                            Biopsied.                           - Z-line irregular, 44 cm from the incisors.                           - Scar at the incisura from prior cystgastrostomy.                           - Gastritis. Biopsied.                           - Erythematous duodenopathy.                           - Congested papilla with biliary stent that has                            migrated inwards proximally into the bile duct.                           EUS Impression:                           - A cystic lesion was seen in the pancreatic head                            and genu of the pancreas. Tissue has not been                            obtained. However, the endosonographic appearance                            is consistent with a pancreatic pseudocyst. This is                            causing biliary compression at the distal duct on                            EUS. AXIOS cystgastrostomy created and double                            pigtail placed.                           - One stent was visualized endosonographically in  the common bile duct but it has migrated inwards.                           - No malignant-appearing lymph nodes were                            visualized in the perigastric region and                            peripancreatic region. Recommendation:           - The patient will be observed post-procedure,                            until all discharge criteria are met.                           - Discharge patient to home.                           - Patient has a contact number available for                            emergencies. The signs and symptoms of potential                            delayed complications were discussed with the                            patient. Return to normal activities  tomorrow.                            Written discharge instructions were provided to the                            patient.                           - Low fat diet.                           - Monitor for signs/symptoms of bleeding,                            perforation, and infection. If issues please call                            our number to get further assistance as needed.                           - Please use Cepacol or Halls Lozenges +/-                            Chloraseptic spray for next 72-96 hours to aid in  sore thoat should you experience this.                           - Observe patient's clinical course.                           - Continue present medications.                           - CTAbdomen in 3-weeks.                           - Await path results.                           - Plan for ERCP on 1/19 (will be scheduled with                            patient in coming days). Will hope that we can get                            biliary stent out now that cyst has been drained,                            then proceed with pancreatic duct evaluation if                            possible, and subsequently decide on replacement of                            biliary stent or not.                           - The findings and recommendations were discussed                            with the patient.                           - The findings and recommendations were discussed                            with the patient's family. Procedure Code(s):        --- Professional ---                           [phone], Esophagogastroduodenoscopy, flexible,                            transoral; with transmural drainage of pseudocyst                            (includes placement of transmural drainage                            catheter[s]/stent[s], when performed, and  endoscopic ultrasound, when performed)                            [id number], Esophagogastroduodenoscopy, flexible,                            transoral; with endoscopic ultrasound examination                            limited to the esophagus, stomach or duodenum, and                            adjacent structures Diagnosis Code(s):        --- Professional ---                           K22.8, Other specified diseases of esophagus                           K31.89, Other diseases of stomach and duodenum                           K29.70, Gastritis, unspecified, without bleeding                           K29.80, Duodenitis without bleeding                           K86.2, Cyst of pancreas                           I89.9, Noninfective disorder of lymphatic vessels                            and lymph nodes, unspecified                           K86.3, Pseudocyst of pancreas                           R10.13, Epigastric pain                           R68.81, Early satiety                           R63.4, Abnormal weight loss CPT copyright 2019 [employer]. All rights reserved. The codes documented in this report are preliminary and upon coder review may  be revised to meet current compliance requirements. Justice Britain, MD 06/26/2021 9:34:46 AM Number of Addenda: 0

## 2021-06-26 NOTE — Anesthesia Postprocedure Evaluation (Signed)
Anesthesia Post Note  Patient: Steve Romero  Procedure(s) Performed: UPPER ENDOSCOPIC ULTRASOUND (EUS) RADIAL BIOPSY PANCREATIC STENT PLACEMENT BILIARY DILATION     Patient location during evaluation: PACU Anesthesia Type: General Level of consciousness: awake and alert Pain management: pain level controlled Vital Signs Assessment: post-procedure vital signs reviewed and stable Respiratory status: spontaneous breathing, nonlabored ventilation and respiratory function stable Cardiovascular status: blood pressure returned to baseline and stable Postop Assessment: no apparent nausea or vomiting Anesthetic complications: no   No notable events documented.  Last Vitals:  Vitals:   06/26/21 0915 06/26/21 0930  BP: 120/81 128/80  Pulse: 65 66  Resp: 20 (!) 22  Temp:  36.6 C  SpO2: 92% 93%    Last Pain:  Vitals:   06/26/21 0930  TempSrc:   PainSc: 0-No pain                 [name]

## 2021-06-26 NOTE — Transfer of Care (Signed)
Immediate Anesthesia Transfer of Care Note  Patient: Steve Romero  Procedure(s) Performed: UPPER ENDOSCOPIC ULTRASOUND (EUS) RADIAL BIOPSY PANCREATIC STENT PLACEMENT BILIARY DILATION  Patient Location: PACU  Anesthesia Type:General  Level of Consciousness: awake, alert  and oriented  Airway & Oxygen Therapy: Patient Spontanous Breathing and Patient connected to nasal cannula oxygen  Post-op Assessment: Report given to RN, Post -op Vital signs reviewed and stable and Patient moving all extremities X 4  Post vital signs: Reviewed and stable  Last Vitals:  Vitals Value Taken Time  BP 133/80 06/26/21 0900  Temp    Pulse 66 06/26/21 0903  Resp 24 06/26/21 0903  SpO2 94 % 06/26/21 0903  Vitals shown include unvalidated device data.  Last Pain:  Vitals:   06/26/21 0700  TempSrc: Temporal  PainSc: 0-No pain         Complications: No notable events documented.

## 2021-06-27 ENCOUNTER — Telehealth: Payer: Self-pay

## 2021-06-27 LAB — SURGICAL PATHOLOGY

## 2021-06-27 NOTE — Telephone Encounter (Signed)
-----   Message from Irving Copas., MD sent at 06/27/2021  9:03 AM EST ----- Regarding: Follow-up Patty or covering RN, This patient needs a CT abdomen with IV and oral contrast in 3 weeks. This patient needs to be scheduled on 07/24/2021 for ERCP/EGD with AXIOS stent removal. The current patient Estelle June no longer needs that ERCP time slot. All of that can be put into that time slot. The CT scan needs to be done a few days before the ERCP. Thanks. GM

## 2021-06-30 ENCOUNTER — Encounter (HOSPITAL_COMMUNITY): Payer: Self-pay | Admitting: Gastroenterology

## 2021-07-01 ENCOUNTER — Other Ambulatory Visit: Payer: Self-pay

## 2021-07-01 ENCOUNTER — Encounter: Payer: Self-pay | Admitting: Gastroenterology

## 2021-07-01 DIAGNOSIS — K8689 Other specified diseases of pancreas: Secondary | ICD-10-CM

## 2021-07-01 DIAGNOSIS — K831 Obstruction of bile duct: Secondary | ICD-10-CM

## 2021-07-01 DIAGNOSIS — K862 Cyst of pancreas: Secondary | ICD-10-CM

## 2021-07-09 ENCOUNTER — Other Ambulatory Visit: Payer: Medicaid Other

## 2021-07-09 DIAGNOSIS — K831 Obstruction of bile duct: Secondary | ICD-10-CM

## 2021-07-09 DIAGNOSIS — R933 Abnormal findings on diagnostic imaging of other parts of digestive tract: Secondary | ICD-10-CM

## 2021-07-09 DIAGNOSIS — R6881 Early satiety: Secondary | ICD-10-CM

## 2021-07-09 DIAGNOSIS — K8689 Other specified diseases of pancreas: Secondary | ICD-10-CM

## 2021-07-09 DIAGNOSIS — K862 Cyst of pancreas: Secondary | ICD-10-CM

## 2021-07-09 NOTE — Addendum Note (Signed)
Addended by: Reather Converse on: 07/09/2021 09:45 AM   Modules accepted: Orders

## 2021-07-10 LAB — COMPREHENSIVE METABOLIC PANEL
ALT: 20 IU/L (ref 0–44)
AST: 11 IU/L (ref 0–40)
Albumin/Globulin Ratio: 1.9 (ref 1.2–2.2)
Albumin: 4.3 g/dL (ref 4.0–5.0)
Alkaline Phosphatase: 179 IU/L — ABNORMAL HIGH (ref 44–121)
BUN/Creatinine Ratio: 13 (ref 9–20)
BUN: 11 mg/dL (ref 6–24)
Bilirubin Total: 0.2 mg/dL (ref 0.0–1.2)
CO2: 23 mmol/L (ref 20–29)
Calcium: 9.5 mg/dL (ref 8.7–10.2)
Chloride: 104 mmol/L (ref 96–106)
Creatinine, Ser: 0.88 mg/dL (ref 0.76–1.27)
Globulin, Total: 2.3 g/dL (ref 1.5–4.5)
Glucose: 119 mg/dL — ABNORMAL HIGH (ref 70–99)
Potassium: 4.6 mmol/L (ref 3.5–5.2)
Sodium: 143 mmol/L (ref 134–144)
Total Protein: 6.6 g/dL (ref 6.0–8.5)
eGFR: 105 mL/min/{1.73_m2} (ref >59)

## 2021-07-10 LAB — CBC
Hematocrit: 50.2 % (ref 37.5–51.0)
Hemoglobin: 16.9 g/dL (ref 13.0–17.7)
MCH: 29.3 pg (ref 26.6–33.0)
MCHC: 33.7 g/dL (ref 31.5–35.7)
MCV: 87 fL (ref 79–97)
Platelets: 312 10*3/uL (ref 150–450)
RBC: 5.77 x10E6/uL (ref 4.14–5.80)
RDW: 13.3 % (ref 11.6–15.4)
WBC: 9.7 10*3/uL (ref 3.4–10.8)

## 2021-07-10 LAB — PROTIME-INR
INR: 0.9 (ref 0.9–1.2)
Prothrombin Time: 9.6 s (ref 9.1–12.0)

## 2021-07-15 ENCOUNTER — Encounter (HOSPITAL_COMMUNITY): Payer: Self-pay | Admitting: Gastroenterology

## 2021-07-15 NOTE — Progress Notes (Signed)
Attempted to obtain medical history via telephone, unable to reach at this time. I left a voicemail to return pre surgical testing department's phone call.  

## 2021-07-17 ENCOUNTER — Ambulatory Visit (HOSPITAL_COMMUNITY)
Admission: RE | Admit: 2021-07-17 | Discharge: 2021-07-17 | Disposition: A | Discharge status: Home / Self Care | Payer: Medicaid Other | Source: Ambulatory Visit | Attending: Gastroenterology | Admitting: Gastroenterology

## 2021-07-17 DIAGNOSIS — K862 Cyst of pancreas: Secondary | ICD-10-CM | POA: Insufficient documentation

## 2021-07-17 DIAGNOSIS — K8689 Other specified diseases of pancreas: Secondary | ICD-10-CM | POA: Insufficient documentation

## 2021-07-17 DIAGNOSIS — K831 Obstruction of bile duct: Secondary | ICD-10-CM | POA: Insufficient documentation

## 2021-07-17 MED ORDER — SODIUM CHLORIDE (PF) 0.9 % IJ SOLN
INTRAMUSCULAR | Status: AC
  Filled 2021-07-17: qty 50

## 2021-07-17 MED ORDER — IOHEXOL 300 MG/ML  SOLN
100.0000 mL | Freq: Once | INTRAMUSCULAR | Status: AC | PRN
  Administered 2021-07-17: 100 mL via INTRAVENOUS

## 2021-07-18 ENCOUNTER — Encounter: Payer: Self-pay | Admitting: Gastroenterology

## 2021-07-24 ENCOUNTER — Encounter (HOSPITAL_COMMUNITY): Payer: Self-pay | Admitting: Gastroenterology

## 2021-07-24 ENCOUNTER — Ambulatory Visit (HOSPITAL_COMMUNITY): Payer: Medicaid Other | Admitting: Anesthesiology

## 2021-07-24 ENCOUNTER — Other Ambulatory Visit: Payer: Self-pay

## 2021-07-24 ENCOUNTER — Ambulatory Visit (HOSPITAL_COMMUNITY)
Admission: RE | Admit: 2021-07-24 | Discharge: 2021-07-24 | Disposition: A | Discharge status: Home / Self Care | Payer: Medicaid Other | Attending: Gastroenterology | Admitting: Gastroenterology

## 2021-07-24 ENCOUNTER — Ambulatory Visit (HOSPITAL_COMMUNITY): Payer: Medicaid Other

## 2021-07-24 ENCOUNTER — Encounter (HOSPITAL_COMMUNITY)
Admission: RE | Disposition: A | Discharge status: Home / Self Care | Payer: Self-pay | Source: Home / Self Care | Attending: Gastroenterology

## 2021-07-24 DIAGNOSIS — K862 Cyst of pancreas: Secondary | ICD-10-CM

## 2021-07-24 DIAGNOSIS — K3189 Other diseases of stomach and duodenum: Secondary | ICD-10-CM

## 2021-07-24 DIAGNOSIS — K859 Acute pancreatitis without necrosis or infection, unspecified: Secondary | ICD-10-CM | POA: Diagnosis not present

## 2021-07-24 DIAGNOSIS — Z4659 Encounter for fitting and adjustment of other gastrointestinal appliance and device: Secondary | ICD-10-CM | POA: Diagnosis not present

## 2021-07-24 DIAGNOSIS — K8689 Other specified diseases of pancreas: Secondary | ICD-10-CM

## 2021-07-24 DIAGNOSIS — F17210 Nicotine dependence, cigarettes, uncomplicated: Secondary | ICD-10-CM | POA: Insufficient documentation

## 2021-07-24 DIAGNOSIS — K831 Obstruction of bile duct: Secondary | ICD-10-CM

## 2021-07-24 DIAGNOSIS — K863 Pseudocyst of pancreas: Secondary | ICD-10-CM | POA: Diagnosis not present

## 2021-07-24 DIAGNOSIS — Y732 Prosthetic and other implants, materials and accessory gastroenterology and urology devices associated with adverse incidents: Secondary | ICD-10-CM | POA: Insufficient documentation

## 2021-07-24 DIAGNOSIS — Z8781 Personal history of (healed) traumatic fracture: Secondary | ICD-10-CM

## 2021-07-24 DIAGNOSIS — I10 Essential (primary) hypertension: Secondary | ICD-10-CM | POA: Insufficient documentation

## 2021-07-24 DIAGNOSIS — G4730 Sleep apnea, unspecified: Secondary | ICD-10-CM | POA: Insufficient documentation

## 2021-07-24 DIAGNOSIS — T85520A Displacement of bile duct prosthesis, initial encounter: Secondary | ICD-10-CM

## 2021-07-24 DIAGNOSIS — Z419 Encounter for procedure for purposes other than remedying health state, unspecified: Secondary | ICD-10-CM

## 2021-07-24 DIAGNOSIS — J449 Chronic obstructive pulmonary disease, unspecified: Secondary | ICD-10-CM | POA: Diagnosis not present

## 2021-07-24 HISTORY — PX: STENT REMOVAL: SHX6421

## 2021-07-24 HISTORY — PX: BILIARY DILATION: SHX6850

## 2021-07-24 HISTORY — PX: SPYGLASS CHOLANGIOSCOPY: SHX5441

## 2021-07-24 HISTORY — PX: SPHINCTEROTOMY: SHX5544

## 2021-07-24 HISTORY — PX: ENDOSCOPIC RETROGRADE CHOLANGIOPANCREATOGRAPHY (ERCP) WITH PROPOFOL: SHX5810

## 2021-07-24 HISTORY — PX: ESOPHAGOGASTRODUODENOSCOPY (EGD) WITH PROPOFOL: SHX5813

## 2021-07-24 HISTORY — PX: REMOVAL OF STONES: SHX5545

## 2021-07-24 HISTORY — PX: BILIARY STENT PLACEMENT: SHX5538

## 2021-07-24 SURGERY — ENDOSCOPIC RETROGRADE CHOLANGIOPANCREATOGRAPHY (ERCP) WITH PROPOFOL
Anesthesia: General

## 2021-07-24 MED ORDER — SUGAMMADEX SODIUM 200 MG/2ML IV SOLN
INTRAVENOUS | Status: DC | PRN
Start: 2021-07-24 — End: 2021-07-24
  Administered 2021-07-24: 200 mg via INTRAVENOUS

## 2021-07-24 MED ORDER — PHENYLEPHRINE 40 MCG/ML (10ML) SYRINGE FOR IV PUSH (FOR BLOOD PRESSURE SUPPORT)
PREFILLED_SYRINGE | INTRAVENOUS | Status: DC | PRN
  Administered 2021-07-24: 80 ug via INTRAVENOUS

## 2021-07-24 MED ORDER — PROPOFOL 10 MG/ML IV BOLUS
INTRAVENOUS | Status: DC | PRN
Start: 2021-07-24 — End: 2021-07-24
  Administered 2021-07-24: 150 mg via INTRAVENOUS

## 2021-07-24 MED ORDER — INDOMETHACIN 50 MG RE SUPP
RECTAL | Status: AC
  Filled 2021-07-24: qty 2

## 2021-07-24 MED ORDER — FENTANYL CITRATE (PF) 100 MCG/2ML IJ SOLN
INTRAMUSCULAR | Status: DC | PRN
Start: 2021-07-24 — End: 2021-07-24
  Administered 2021-07-24 (×2): 50 ug via INTRAVENOUS

## 2021-07-24 MED ORDER — LACTATED RINGERS IV SOLN
INTRAVENOUS | Status: AC | PRN
  Administered 2021-07-24: 1000 mL via INTRAVENOUS

## 2021-07-24 MED ORDER — DEXAMETHASONE SODIUM PHOSPHATE 10 MG/ML IJ SOLN
INTRAMUSCULAR | Status: DC | PRN
  Administered 2021-07-24: 8 mg via INTRAVENOUS

## 2021-07-24 MED ORDER — CIPROFLOXACIN IN D5W 400 MG/200ML IV SOLN
INTRAVENOUS | Status: AC
  Filled 2021-07-24: qty 200

## 2021-07-24 MED ORDER — SODIUM CHLORIDE 0.9 % IV SOLN: INTRAVENOUS | Status: DC

## 2021-07-24 MED ORDER — MIDAZOLAM HCL 2 MG/2ML IJ SOLN
INTRAMUSCULAR | Status: DC | PRN
Start: 2021-07-24 — End: 2021-07-24
  Administered 2021-07-24: 2 mg via INTRAVENOUS

## 2021-07-24 MED ORDER — ROCURONIUM BROMIDE 10 MG/ML (PF) SYRINGE
PREFILLED_SYRINGE | INTRAVENOUS | Status: DC | PRN
  Administered 2021-07-24: 70 mg via INTRAVENOUS
  Administered 2021-07-24: 30 mg via INTRAVENOUS

## 2021-07-24 MED ORDER — GLUCAGON HCL RDNA (DIAGNOSTIC) 1 MG IJ SOLR
INTRAMUSCULAR | Status: DC | PRN
  Administered 2021-07-24 (×3): .25 mg via INTRAVENOUS

## 2021-07-24 MED ORDER — LIDOCAINE 2% (20 MG/ML) 5 ML SYRINGE
INTRAMUSCULAR | Status: DC | PRN
Start: 2021-07-24 — End: 2021-07-24
  Administered 2021-07-24: 80 mg via INTRAVENOUS

## 2021-07-24 MED ORDER — ONDANSETRON HCL 4 MG/2ML IJ SOLN
INTRAMUSCULAR | Status: DC | PRN
Start: 2021-07-24 — End: 2021-07-24
  Administered 2021-07-24: 4 mg via INTRAVENOUS

## 2021-07-24 MED ORDER — GLUCAGON HCL RDNA (DIAGNOSTIC) 1 MG IJ SOLR
INTRAMUSCULAR | Status: AC
  Filled 2021-07-24: qty 1

## 2021-07-24 MED ORDER — SODIUM CHLORIDE 0.9 % IV SOLN
INTRAVENOUS | Status: DC | PRN
  Administered 2021-07-24: 40 mL

## 2021-07-24 MED ORDER — INDOMETHACIN 50 MG RE SUPP
RECTAL | Status: DC | PRN
  Administered 2021-07-24: 100 mg via RECTAL

## 2021-07-24 SURGICAL SUPPLY — 15 items

## 2021-07-24 NOTE — H&P (Signed)
GASTROENTEROLOGY PROCEDURE H&P NOTE   Primary Care Physician: [name], DO  HPI: Steve Romero is a 51 y.o. male who presents for EGD/ERCP for cystgastrostomy stent removal, biliary stent removal attempt, attempt at PD cannulation to evaluate for potential disconnected duct syndrome (in setting of recurrent pseudocyst development), and reevaluation of the biliary narrowing/stricture previously noted due to severe pancreatitis.  Past Medical History:  Diagnosis Date   Acute pancreatitis without necrosis or infection, unspecified    Anxiety 10/2014   Ascites    Bipolar disorder [facility]) age 82   COPD (chronic obstructive pulmonary disease) ([place]) 2016   Depression age 67   Enlarged prostate    Hyperlipidemia 2016   Hypertension 2013   Schizoaffective disorder ([place]) 11/13/2014   Sleep apnea    Does not wear c-pap, sleeps in sitting up position per pt   Thyroid disease 11/2014   Past Surgical History:  Procedure Laterality Date   BALLOON DILATION N/A 11/07/2020   Procedure: BALLOON DILATION;  Surgeon: Irving Copas., MD;  Location: [facility] ENDOSCOPY;  Service: Gastroenterology;  Laterality: N/A;   BALLOON DILATION N/A 06/26/2021   Procedure: BALLOON DILATION;  Surgeon: Rush Landmark Telford Nab., MD;  Location: [place];  Service: Gastroenterology;  Laterality: N/A;   BILIARY STENT PLACEMENT N/A 10/10/2020   Procedure: BILIARY STENT PLACEMENT;  Surgeon: Rogene Houston, MD;  Location: [facility];  Service: Endoscopy;  Laterality: N/A;   BILIARY STENT PLACEMENT  11/09/2020   Procedure: BILIARY STENT PLACEMENT;  Surgeon: Rush Landmark Telford Nab., MD;  Location: [place];  Service: Gastroenterology;;   BILIARY STENT PLACEMENT  11/07/2020   Procedure: BILIARY STENT PLACEMENT;  Surgeon: Irving Copas., MD;  Location: [place];  Service: Gastroenterology;;   BILIARY STENT PLACEMENT  11/11/2020   Procedure: BILIARY STENT PLACEMENT;  Surgeon: Irving Copas., MD;  Location: [place];  Service: Gastroenterology;;   BILIARY STENT PLACEMENT  11/14/2020   Procedure: BILIARY STENT PLACEMENT;  Surgeon: Irving Copas., MD;  Location: [place];  Service: Gastroenterology;;   BILIARY STENT PLACEMENT N/A 01/29/2021   Procedure: BILIARY STENT PLACEMENT;  Surgeon: Irving Copas., MD;  Location: [facility];  Service: Gastroenterology;  Laterality: N/A;   BILIARY STENT PLACEMENT  06/26/2021   Procedure: BILIARY STENT PLACEMENT;  Surgeon: Rush Landmark Telford Nab., MD;  Location: [place];  Service: Gastroenterology;;   BIOPSY  11/07/2020   Procedure: BIOPSY;  Surgeon: Irving Copas., MD;  Location: [place];  Service: Gastroenterology;;   BIOPSY  12/20/2020   Procedure: BIOPSY;  Surgeon: Irving Copas., MD;  Location: [facility];  Service: Gastroenterology;;   BIOPSY  06/26/2021   Procedure: BIOPSY;  Surgeon: Irving Copas., MD;  Location: [place];  Service: Gastroenterology;;   CARPAL TUNNEL RELEASE Left 02/21/2016   Procedure: CARPAL TUNNEL RELEASE;  Surgeon: Carole Civil, MD;  Location: [facility];  Service: Orthopedics;  Laterality: Left;   CHOLECYSTECTOMY N/A 10/09/2020   Procedure: LAPAROSCOPIC CHOLECYSTECTOMY;  Surgeon: Virl Cagey, MD;  Location: [facility];  Service: General;  Laterality: N/A;   CYST ENTEROSTOMY N/A 11/07/2020   Procedure: CYST ENTEROSTOMY;  Surgeon: Irving Copas., MD;  Location: [address];  Service: Gastroenterology;  Laterality: N/A;   CYST GASTROSTOMY  11/11/2020   Procedure: CYST NECROSECTOMY;  Surgeon: Rush Landmark Telford Nab., MD;  Location: [place];  Service: Gastroenterology;;   CYST GASTROSTOMY  11/14/2020   Procedure: CYST NECROSECTOMY;  Surgeon: Irving Copas., MD;  Location: [place];  Service: Gastroenterology;;  CYST REMOVAL HAND     CYSTOSCOPY  06/26/2021   Procedure: CYSTOGASTROSTOMY;  Surgeon: Mansouraty,  Telford Nab., MD;  Location: [facility] ENDOSCOPY;  Service: Gastroenterology;;   ENDOSCOPIC RETROGRADE CHOLANGIOPANCREATOGRAPHY (ERCP) WITH PROPOFOL N/A 11/09/2020   Procedure: ENDOSCOPIC RETROGRADE CHOLANGIOPANCREATOGRAPHY (ERCP) WITH PROPOFOL;  Surgeon: Irving Copas., MD;  Location: [place];  Service: Gastroenterology;  Laterality: N/A;   ENDOSCOPIC RETROGRADE CHOLANGIOPANCREATOGRAPHY (ERCP) WITH PROPOFOL N/A 12/20/2020   Procedure: ENDOSCOPIC RETROGRADE CHOLANGIOPANCREATOGRAPHY (ERCP) WITH PROPOFOL;  Surgeon: Rush Landmark Telford Nab., MD;  Location: [facility];  Service: Gastroenterology;  Laterality: N/A;   ENDOSCOPIC RETROGRADE CHOLANGIOPANCREATOGRAPHY (ERCP) WITH PROPOFOL N/A 01/29/2021   Procedure: ENDOSCOPIC RETROGRADE CHOLANGIOPANCREATOGRAPHY (ERCP) WITH PROPOFOL;  Surgeon: Rush Landmark Telford Nab., MD;  Location: [facility];  Service: Gastroenterology;  Laterality: N/A;   ERCP N/A 10/10/2020   Procedure: ENDOSCOPIC RETROGRADE CHOLANGIOPANCREATOGRAPHY (ERCP);  Surgeon: Rogene Houston, MD;  Location: [facility];  Service: Endoscopy;  Laterality: N/A;   ERCP     ESOPHAGOGASTRODUODENOSCOPY N/A 11/11/2020   Procedure: ESOPHAGOGASTRODUODENOSCOPY (EGD);  Surgeon: Irving Copas., MD;  Location: [place];  Service: Gastroenterology;  Laterality: N/A;   ESOPHAGOGASTRODUODENOSCOPY N/A 11/14/2020   Procedure: ESOPHAGOGASTRODUODENOSCOPY (EGD);  Surgeon: Irving Copas., MD;  Location: [place];  Service: Gastroenterology;  Laterality: N/A;   ESOPHAGOGASTRODUODENOSCOPY (EGD) WITH PROPOFOL N/A 11/09/2020   Procedure: ESOPHAGOGASTRODUODENOSCOPY (EGD) WITH PROPOFOL;  Surgeon: Rush Landmark Telford Nab., MD;  Location: [place];  Service: Gastroenterology;  Laterality: N/A;   ESOPHAGOGASTRODUODENOSCOPY (EGD) WITH PROPOFOL N/A 11/07/2020   Procedure: ESOPHAGOGASTRODUODENOSCOPY (EGD) WITH PROPOFOL;  Surgeon: Rush Landmark Telford Nab., MD;  Location: [place];  Service:  Gastroenterology;  Laterality: N/A;   ESOPHAGOGASTRODUODENOSCOPY (EGD) WITH PROPOFOL N/A 12/20/2020   Procedure: ESOPHAGOGASTRODUODENOSCOPY (EGD) WITH PROPOFOL;  Surgeon: Rush Landmark Telford Nab., MD;  Location: [facility];  Service: Gastroenterology;  Laterality: N/A;   ESOPHAGOGASTRODUODENOSCOPY (EGD) WITH PROPOFOL N/A 06/26/2021   Procedure: ESOPHAGOGASTRODUODENOSCOPY (EGD) WITH PROPOFOL;  Surgeon: Rush Landmark Telford Nab., MD;  Location: [place];  Service: Gastroenterology;  Laterality: N/A;   EUS  11/14/2020   Procedure: UPPER ENDOSCOPIC ULTRASOUND (EUS) LINEAR;  Surgeon: Irving Copas., MD;  Location: [place];  Service: Gastroenterology;;   EUS N/A 06/26/2021   Procedure: UPPER ENDOSCOPIC ULTRASOUND (EUS) RADIAL;  Surgeon: Irving Copas., MD;  Location: [place];  Service: Gastroenterology;  Laterality: N/A;   GASTROINTESTINAL STENT REMOVAL  12/20/2020   Procedure: GASTROINTESTINAL STENT REMOVAL;  Surgeon: Irving Copas., MD;  Location: [facility];  Service: Gastroenterology;;  cyst gastrostomy stent and double pig tail stents x2 removed   HERNIA REPAIR Left 2002   groin   INGUINAL HERNIA REPAIR Left 04/05/2017   Procedure: RECURRENT HERNIA REPAIR INGUINAL ADULT WITH MESH;  Surgeon: [name], MD;  Location: [facility];  Service: General;  Laterality: Left;   MASS EXCISION Right 04/29/2020   Procedure: EXCISION MASS RIGHT WRIST;  Surgeon: [name], MD;  Location: [place];  Service: Orthopedics;  Laterality: Right;   MASS EXCISION Right 10/09/2020   Procedure: EXCISION MASS, ABDOMINAL WALL, 2CM;  Surgeon: Virl Cagey, MD;  Location: [facility];  Service: General;  Laterality: Right;   PANCREATIC STENT PLACEMENT  06/26/2021   Procedure: AXIOS STENT PLACEMENT;  Surgeon: Irving Copas., MD;  Location: [place];  Service: Gastroenterology;;   REMOVAL OF STONES  12/20/2020   Procedure: REMOVAL OF STONES;  Surgeon:  Irving Copas., MD;  Location: [facility];  Service: Gastroenterology;;   Joan Mayans N/A 10/10/2020   Procedure: Joan Mayans;  Surgeon:  Rogene Houston, MD;  Location[facility];  Service: Endoscopy;  Laterality: N/A;   SPYGLASS CHOLANGIOSCOPY N/A 12/20/2020   Procedure: PRFFMBWG CHOLANGIOSCOPY;  Surgeon: Irving Copas., MD;  Location: [facility];  Service: Gastroenterology;  Laterality: N/A;   STENT REMOVAL  11/09/2020   Procedure: STENT REMOVAL;  Surgeon: Irving Copas., MD;  Location: [place];  Service: Gastroenterology;;   Lavell Islam REMOVAL  11/11/2020   Procedure: STENT REMOVAL;  Surgeon: Irving Copas., MD;  Location: [place];  Service: Gastroenterology;;   Lavell Islam REMOVAL  11/14/2020   Procedure: STENT REMOVAL;  Surgeon: Irving Copas., MD;  Location: [place];  Service: Gastroenterology;;   Lavell Islam REMOVAL  12/20/2020   Procedure: STENT REMOVAL;  Surgeon: Irving Copas., MD;  Location: [facility];  Service: Gastroenterology;;  biliary x2   UPPER ESOPHAGEAL ENDOSCOPIC ULTRASOUND (EUS) N/A 11/07/2020   Procedure: UPPER ESOPHAGEAL ENDOSCOPIC ULTRASOUND (EUS);  Surgeon: Irving Copas., MD;  Location: [place];  Service: Gastroenterology;  Laterality: N/A;   UPPER GASTROINTESTINAL ENDOSCOPY     WOUND DEBRIDEMENT  11/09/2020   Procedure: CYST NECROSECTOMY;  Surgeon: Rush Landmark, Telford Nab., MD;  Location: [facility] END[facility];  Service: Gastroenterology;;   Current Facility-Administered Medications  Medication Dose Route Frequency Provider Last Rate Last Admin   0.9 %  sodium chloride infusion   Intravenous Continuous Mansouraty, Telford Nab., MD       lactated ringers infusion    Continuous PRN Mansouraty, Telford Nab., MD   1,000 mL at 07/24/21 0723    Current Facility-Administered Medications:    0.9 %  sodium chloride infusion, , Intravenous, Continuous, Mansouraty, Telford Nab., MD   lactated ringers infusion,  , , Continuous PRN, Mansouraty, Telford Nab., MD, 1,000 mL at 07/24/21 6659 Allergies  Allergen Reactions   Desipramine Nausea Only and Rash   Family History  Problem Relation Age of Onset   Diabetes Father    Heart disease Father    Heart disease Maternal Grandmother    Stroke Maternal Grandfather    Colon cancer Neg Hx    Esophageal cancer Neg Hx    Inflammatory bowel disease Neg Hx    Liver disease Neg Hx    Pancreatic cancer Neg Hx    Rectal cancer Neg Hx    Stomach cancer Neg Hx    Social History   Socioeconomic History   Marital status: Single    Spouse name: Not on file   Number of children: Not on file   Years of education: Not on file   Highest education level: Not on file  Occupational History   Not on file  Tobacco Use   Smoking status: Every Day    Packs/day: 0.25    Years: 26.00    Pack years: 6.50    Types: Cigarettes   Smokeless tobacco: Never   Tobacco comments:    4-5 cig daily as of 10/07/2020.  Vaping Use   Vaping Use: Never used  Substance and Sexual Activity   Alcohol use: No   Drug use: No   Sexual activity: Never    Birth control/protection: None  Other Topics Concern   Not on file  Social History Narrative   Not on file   Social Determinants of Health   Financial Resource Strain: Not on file  Food Insecurity: Not on file  Transportation Needs: Not on file  Physical Activity: Not on file  Stress: Not on file  Social Connections: Not on file  Intimate Partner Violence: Not on file  Physical Exam: Today's Vitals   07/24/21 0715  BP: 129/79  Pulse: 68  Resp: 20  Temp: 99.2 F (37.3 C)  TempSrc: Oral  SpO2: 97%  Weight: 90.7 kg  Height: 5\' 10"  (1.778 m)  PainSc: 0-No pain   Body mass index is 28.7 kg/m. GEN: NAD EYE: Sclerae anicteric ENT: MMM CV: Non-tachycardic GI: Soft, NT/ND NEURO:  Alert & Oriented x 3  Lab Results: No results for input(s): WBC, HGB, HCT, PLT in the last 72 hours. BMET No results for  input(s): NA, K, CL, CO2, GLUCOSE, BUN, CREATININE, CALCIUM in the last 72 hours. LFT No results for input(s): PROT, ALBUMIN, AST, ALT, ALKPHOS, BILITOT, BILIDIR, IBILI in the last 72 hours. PT/INR No results for input(s): LABPROT, INR in the last 72 hours.   Impression / Plan: This is a 51 y.o.male who presents for EGD/ERCP for cystgastrostomy stent removal, biliary stent removal attempt, attempt at PD cannulation to evaluate for potential disconnected duct syndrome (in setting of recurrent pseudocyst development), and reevaluation of the biliary narrowing/stricture previously noted due to severe pancreatitis.  The risks of an ERCP were discussed at length, including but not limited to the risk of perforation, bleeding, abdominal pain, post-ERCP pancreatitis (while usually mild can be severe and even life threatening).   The risks and benefits of endoscopic evaluation/treatment were discussed with the patient and/or family; these include but are not limited to the risk of perforation, infection, bleeding, missed lesions, lack of diagnosis, severe illness requiring hospitalization, as well as anesthesia and sedation related illnesses.  The patient's history has been reviewed, patient examined, no change in status, and deemed stable for procedure.  The patient and/or family is agreeable to proceed.    Justice Britain, MD [facility] Office # [phone]

## 2021-07-24 NOTE — Anesthesia Preprocedure Evaluation (Signed)
Anesthesia Evaluation  Patient identified by MRN, date of birth, ID band Patient awake    Reviewed: Allergy & Precautions, NPO status , Patient's Chart, lab work & pertinent test results  Airway Mallampati: I  TM Distance: >3 FB Neck ROM: Full    Dental no notable dental hx. (+) Edentulous Upper, Edentulous Lower   Pulmonary sleep apnea , COPD,  COPD inhaler, Current Smoker and Patient abstained from smoking.,    Pulmonary exam normal breath sounds clear to auscultation       Cardiovascular hypertension, Pt. on medications Normal cardiovascular exam Rhythm:Regular Rate:Normal  10/25/20 TTE 1. Poor acoustic windows limit study.  2. Cannot fully evaluate regional wall motion as endocardium is difficult  to see.. Left ventricular ejection fraction, by estimation, is >75%. The  left ventricle has hyperdynamic function. There is mild left ventricular  hypertrophy. Left ventricular  diastolic parameters were normal.  3. Right ventricular systolic function is normal. The right ventricular  size is normal.  4. The mitral valve is normal in structure. Trivial mitral valve  regurgitation.  5. The aortic valve is abnormal. Aortic valve regurgitation is not  visualized. Mild aortic valve sclerosis is present, with no evidence of  aortic valve stenosis.  6. The inferior vena cava is normal in size with greater than 50%  respiratory variability, suggesting right atrial pressure of 3 mmHg.    Neuro/Psych Anxiety Depression Bipolar Disorder  Neuromuscular disease    GI/Hepatic Neg liver ROS, Billiary obstruction Acute pancreatitis   Endo/Other  negative endocrine ROS  Renal/GU   negative genitourinary   Musculoskeletal   Abdominal   Peds negative pediatric ROS (+)  Hematology negative hematology ROS (+)   Anesthesia Other Findings   Reproductive/Obstetrics                             Anesthesia  Physical Anesthesia Plan  ASA: 3  Anesthesia Plan: General   Post-op Pain Management:    Induction: Intravenous  PONV Risk Score and Plan: 1 and Treatment may vary due to age or medical condition  Airway Management Planned: Oral ETT  Additional Equipment:   Intra-op Plan:   Post-operative Plan: Extubation in OR  Informed Consent: I have reviewed the patients History and Physical, chart, labs and discussed the procedure including the risks, benefits and alternatives for the proposed anesthesia with the patient or authorized representative who has indicated his/her understanding and acceptance.       Plan Discussed with: CRNA, Anesthesiologist and Surgeon  Anesthesia Plan Comments:         Anesthesia Quick Evaluation

## 2021-07-24 NOTE — Anesthesia Postprocedure Evaluation (Signed)
Anesthesia Post Note  Patient: Steve Romero  Procedure(s) Performed: ENDOSCOPIC RETROGRADE CHOLANGIOPANCREATOGRAPHY (ERCP) WITH PROPOFOL ESOPHAGOGASTRODUODENOSCOPY (EGD) WITH PROPOFOL AXIOS STENT REMOVAL REMOVAL OF STONES SPYGLASS CHOLANGIOSCOPY BILIARY DILATION BILIARY STENT PLACEMENT SPHINCTEROTOMY     Patient location during evaluation: PACU Anesthesia Type: General Level of consciousness: awake Pain management: pain level controlled Vital Signs Assessment: post-procedure vital signs reviewed and stable Respiratory status: spontaneous breathing and respiratory function stable Cardiovascular status: stable Postop Assessment: no apparent nausea or vomiting Anesthetic complications: no   No notable events documented.  Last Vitals:  Vitals:   07/24/21 1128 07/24/21 1143  BP: (!) 146/89 (!) 142/97  Pulse: 76 73  Resp: 20 17  Temp:  36.7 C  SpO2: 97% 94%    Last Pain:  Vitals:   07/24/21 1143  TempSrc:   PainSc: 0-No pain                 Merlinda Frederick

## 2021-07-24 NOTE — Transfer of Care (Signed)
Immediate Anesthesia Transfer of Care Note  Patient: Olie Dibert  Procedure(s) Performed: ENDOSCOPIC RETROGRADE CHOLANGIOPANCREATOGRAPHY (ERCP) WITH PROPOFOL ESOPHAGOGASTRODUODENOSCOPY (EGD) WITH PROPOFOL AXIOS STENT REMOVAL REMOVAL OF STONES SPYGLASS CHOLANGIOSCOPY BILIARY DILATION BILIARY STENT PLACEMENT SPHINCTEROTOMY  Patient Location: PACU  Anesthesia Type:General  Level of Consciousness: drowsy  Airway & Oxygen Therapy: Patient Spontanous Breathing and Patient connected to nasal cannula oxygen  Post-op Assessment: Report given to RN and Post -op Vital signs reviewed and stable  Post vital signs: Reviewed and stable  Last Vitals:  Vitals Value Taken Time  BP    Temp    Pulse    Resp    SpO2      Last Pain:  Vitals:   07/24/21 1113  TempSrc:   PainSc: Asleep         Complications: No notable events documented.

## 2021-07-24 NOTE — Op Note (Signed)
[facility] Patient Name: Steve Romero Procedure Date : 07/24/2021 MRN: [id number] Attending MD: Justice Britain , MD Date of Birth: 1971-04-15 CSN: [id number] Age: 51 Admit Type: Outpatient Procedure:                ERCP Indications:              Acute recurrent pancreatitis, Pancreatic                            pseudocyst, Stent removal, Biliary stent removal,                            Stent removal Providers:                Justice Britain, MD, Doristine Johns, RN, [facility] Technician, Technician Referring MD:             Koleen Distance. [facility] Medicines:                General Anesthesia, Cipro 400 mg IV, Indomethacin                            100 mg PR, Glucagon 4.60 mg IV Complications:            No immediate complications. Estimated Blood Loss:     Estimated blood loss was minimal. Procedure:                Pre-Anesthesia Assessment:                           - Prior to the procedure, a History and Physical                            was performed, and patient medications and                            allergies were reviewed. The patient's tolerance of                            previous anesthesia was also reviewed. The risks                            and benefits of the procedure and the sedation                            options and risks were discussed with the patient.                            All questions were answered, and informed consent                            was obtained. Prior Anticoagulants: The patient has  taken no previous anticoagulant or antiplatelet                            agents. ASA Grade Assessment: III - A patient with                            severe systemic disease. After reviewing the risks                            and benefits, the patient was deemed in                            satisfactory condition to undergo the procedure.                            After obtaining informed consent, the scope was                            passed under direct vision. Throughout the                            procedure, the patient's blood pressure, pulse, and                            oxygen saturations were monitored continuously. The                            [id number]-[id number] ([id number]) Olympus endoscope was                            introduced through the mouth, and used to inject                            contrast into and used to locate the major papilla.                            The PCF-190TL ([id number]) Olympus colonoscope was                            introduced through the mouth, and used to inject                            contrast into and used to locate the major papilla.                            The [id number]-[id number] ([id number]) Olympus duodenoscope was                            introduced through the mouth, and used to inject                            contrast into and used to inject contrast into the  bile duct. The ERCP was technically difficult and                            complex due to abnormal anatomy, inadequate patient                            positioning and significant looping. Successful                            completion of the procedure was aided by changing                            the patient's position to extreme left-lateral                            position and performing the maneuvers documented                            (below) in this report. Scope In: Scope Out: Findings:      A scout film of the abdomen was obtained. One stent ending in the main       bile duct was seen. Two stents (AXIOS and double pigtail) ending in the       pseudocyst were seen.      The esophagus was successfully intubated under direct vision without       detailed examination of the pharynx, larynx, and associated structures.       The therapeutic esophagogastroduodenoscopy scope and pediatric        colonoscope (required due to the J-shape of stomach) were used for the       examination of the upper gastrointestinal tract. The scope was passed       under direct vision through the upper GI tract. No gross lesions were       noted in the entire esophagus. A large amount of food (residue) was       found in the entire examined stomach. A previously placed AXIOS       cystgastrostomy stent and double pigtail stent were found at the       incisura/antrum region. These stents were removed using the Raptor       grasping device. Diffuse moderately erythematous mucosa was found in the       duodenal bulb, in the first portion of the duodenum and in the second       portion of the duodenum. A biliary sphincterotomy had been performed.       The sphincterotomy appeared open. One fully covered metal which had been       placed through the major papilla into the biliary tree was not visible       as it had migrated into the duct.      The ventral pancreatic duct could not be cannulated with the Hydratome       sphincterotome after multiple attempts and I think this is due to       angulation issues as a result of the biliary stent having been migrated       upwards.      A short 0.035 inch Soft Jagwire was then passed into the biliary tree.       The Hydratome  sphincterotome was passed over the guidewire and the bile       duct was then deeply cannulated. Contrast was injected. I personally       interpreted the bile duct images. Ductal flow of contrast was adequate.       Image quality was adequate. Contrast extended to the hepatic ducts.       Opacification of the entire biliary tree except for the cystic duct and       gallbladder was successful. The maximum diameter of the ducts was 10 mm       (due to the size of the stent) through intrahepatics were not       significantly dilated. The previously placed covered metal stent was       visible under fluoroscopy as it had migrated into  the duct. The main       bile duct contained filling defects thought to be sludge. The biliary       tree was swept with a retrieval balloon. Sludge was swept from the duct.       An occlusion cholangiogram was performed that showed no further       significant biliary pathology. I tried to sweep the stent with larger       balloons but it would not move downwards into the duodenum. I decided to       try to extend the biliary sphincterotomy by another 5 mm in length with       a monofilament Hydratome sphincterotome using ERBE electrocautery to see       if I could get the stent out (which was approximately 2 -3 cm inwards.       There was self limited oozing from the sphincterotomy which did not       require treatment. The biliary tree was swept with a retrieval balloon       again unsuccessfully. I then used the rat-toothed forceps under       fluoroscopy to try and get the stent downwards, but could not grasp it.       I tried to pursue a sphincteroplasty to see if I could get further into       the duct. Dilation of the common bile duct with an 02-11-09 mm balloon (to       a maximum balloon size of 10 mm) dilator was successful. The biliary       tree was swept with a retrieval balloon and could not get the stent out.       I then retried the rat-toothed forceps and although it grasped the stent       at times I could not pull it out of the duct. I decided to try to grab       this with the Spyscope. The bile duct was explored endoscopically using       the SpyGlass direct visualization system. The SpyScope was advanced to       the bifurcation. Visibility with the scope was fair. The majority of the       main bile duct contained the covered metal stent. The stent was visibly       patent. I used the Spybite forceps to grasp the distal end of the stent       and pulled this downwards. It did move somewhat but could not get enough       pull to remove the stent completely after multiple  attempts. I  transitioned back to the rat-tooth forceps and alligator forceps and was       able to get the distal end of the stent but it began to unravel but       would not come down further. I wanted to ensure that the biliary tree       was patent so I decided then to place one 10 Fr by 7 cm plastic biliary       stent with a single external flap and a single internal flap through the       metal stent and into the common bile duct. Bile flowed through the       stent. The stent was in good position.      Further attempts at accessing the pancreatic duct were not successful.       Thus a pancreatogram was not performed.      The duodenoscope was withdrawn from the patient and the procedure       completed. Impression:               - No gross lesions in esophagus.                           - A large amount of food (residue) in the stomach.                           - AXIOS and double pigtail cystgastrostomy stents                            in place and removed.                           - Erythematous duodenopathy.                           - Prior biliary sphincterotomy appeared open but                            the prior metal stent had migrated into the duct.                           - The fluoroscopic examination was suspicious for                            sludge.                           - The biliary tree was swept and sludge was found.                           - I extended the biliary sphincterotomy to try and                            help get the biliary stent out.                           - I performed a sphincteroplasty to try and help  get the biliary stent out.                           - It could not be removed with balloon sweep/forcep                            retrieval.                           - I tried the Spyscope forceps to grab the stent                            and this was partially successful to bring stent                             down further but could not remove it completely.                           - Final attempts at pulling the stent began to                            cause unraveling.                           - One plastic biliary stent was placed into the                            common bile duct through the biliary stent that has                            migrated to ensure biliary patency. Recommendation:           - The patient will be observed post-procedure,                            until all discharge criteria are met.                           - Discharge patient to home.                           - Patient has a contact number available for                            emergencies. The signs and symptoms of potential                            delayed complications were discussed with the                            patient. Return to normal activities tomorrow.                            Written discharge instructions were provided to the  patient.                           - Watch for pancreatitis, bleeding, perforation,                            and cholangitis.                           - Low fat diet.                           - Observe patient's clinical course.                           - Discuss with patient/family, for evaluation at                            [facility] to see if CBD stent can be                            removed endoscopically and then PD can be accessed                            for evaluation of pancreatic duct disruption,                            otherwise, surgical managment could be required.                           - CTA in 4-weeks.                           - The findings and recommendations were discussed                            with the patient.                           - The findings and recommendations were discussed                            with the patient's family. Procedure Code(s):         --- Professional ---                           [phone], Endoscopic retrograde                            cholangiopancreatography (ERCP); with removal and                            exchange of stent(s), biliary or pancreatic duct,                            including pre- and post-dilation and guide wire  passage, when performed, including sphincterotomy,                            when performed, each stent exchanged                           43264, Endoscopic retrograde                            cholangiopancreatography (ERCP); with removal of                            calculi/debris from biliary/pancreatic duct(s)                           43273, Endoscopic cannulation of papilla with                            direct visualization of pancreatic/common bile                            duct(s) (List separately in addition to code(s) for                            primary procedure) Diagnosis Code(s):        --- Professional ---                           Z97.8, Presence of other specified devices                           K31.89, Other diseases of stomach and duodenum                           T85.520A, Displacement of bile duct prosthesis,                            initial encounter                           Z46.59, Encounter for fitting and adjustment of                            other gastrointestinal appliance and device                           K85.90, Acute pancreatitis without necrosis or                            infection, unspecified                           K86.3, Pseudocyst of pancreas CPT copyright 2019 American Medical Association. All rights reserved. The codes documented in this report are preliminary and upon coder review may  be revised to meet current compliance requirements. Justice Britain, MD 07/24/2021 11:37:49 AM Number of Addenda: 0

## 2021-07-24 NOTE — Anesthesia Procedure Notes (Signed)
Procedure Name: Intubation Date/Time: 07/24/2021 8:56 AM Performed by: Wilburn Cornelia, CRNA Pre-anesthesia Checklist: Patient identified, Emergency Drugs available, Suction available and Patient being monitored Patient Re-evaluated:Patient Re-evaluated prior to induction Oxygen Delivery Method: Circle System Utilized Preoxygenation: Pre-oxygenation with 100% oxygen Induction Type: IV induction Ventilation: Mask ventilation without difficulty Laryngoscope Size: Mac and 4 Grade View: Grade I Tube type: Oral Tube size: 7.5 mm Number of attempts: 1 Airway Equipment and Method: Stylet and Oral airway Placement Confirmation: ETT inserted through vocal cords under direct vision, positive ETCO2 and breath sounds checked- equal and bilateral Secured at: 23 cm Tube secured with: Tape Dental Injury: Teeth and Oropharynx as per pre-operative assessment

## 2021-07-25 ENCOUNTER — Telehealth: Payer: Self-pay

## 2021-07-25 ENCOUNTER — Encounter (HOSPITAL_COMMUNITY): Payer: Self-pay | Admitting: Gastroenterology

## 2021-07-25 DIAGNOSIS — K831 Obstruction of bile duct: Secondary | ICD-10-CM

## 2021-07-25 DIAGNOSIS — R933 Abnormal findings on diagnostic imaging of other parts of digestive tract: Secondary | ICD-10-CM

## 2021-07-25 DIAGNOSIS — K862 Cyst of pancreas: Secondary | ICD-10-CM

## 2021-07-25 DIAGNOSIS — K8689 Other specified diseases of pancreas: Secondary | ICD-10-CM

## 2021-07-25 NOTE — Telephone Encounter (Signed)
-----   Message from Irving Copas., MD sent at 07/25/2021  6:19 AM EST ----- Regarding: Follow-up Steve Romero, Put a tentative ERCP follow-up in 4 months. Patient needs referral to [facility] ([facility]) for repeat ERCP bile duct stent removal as well as pancreatic duct ERCP evaluation for pancreatic duct leak. Please place this as an urgent referral with my name on it so that we can get this patient expedited a repeat ERCP attempt. If his insurance has any issues with [facility] please let me know so we can figure out where he can go. Follow-up CT abdomen scan in 4 weeks. Follow-up in clinic a few days after the CT scan. LFTs in 2 weeks (okay to have these done at PCP office in [place] which she has done previously). Thank you. GM

## 2021-07-25 NOTE — Telephone Encounter (Signed)
Referral faxed to [facility] as urgent Recall ERCP entered  CT scan ordered and sent to the schedulers  Office visit scheduled for 09/04/21 at 2:10 pm with Dr Rush Landmark Lab order entered.   Left message on machine to call back

## 2021-07-25 NOTE — Telephone Encounter (Signed)
I have spoken with the pt sister and made her aware of the referral.  She will call us if she has not heard from [facility] by the middle of next week.  She will have the pt come in for labs in 2 weeks and come to the appt on 3/2. She is aware that the schedulers will call to set up the CT scan.  She will call if she has any questions.

## 2021-07-29 NOTE — Telephone Encounter (Signed)
Dr Rush Landmark the referral was entered as urgent.  They have the referral but are saying it is processing.  Do you have a contact?

## 2021-07-29 NOTE — Telephone Encounter (Signed)
Pt's sister called.  They have still not heard anything from [facility].  Please advise.  Thank you.

## 2021-07-29 NOTE — Telephone Encounter (Signed)
Noted I have called to make the pt sister aware and the line rings busy will call after hearing from Dr Rush Landmark

## 2021-07-29 NOTE — Telephone Encounter (Signed)
Patty, I have reached out to 1 my contacts, RN [employer] and she is going to work on following up tomorrow or the next day to see where things stand in regards to the referral and let us know if there is anything else that they need and to ensure he is part of the insurance network. Thanks. GM

## 2021-07-30 NOTE — Telephone Encounter (Signed)
Inbound call from patient's sister returning your call. 

## 2021-07-30 NOTE — Telephone Encounter (Signed)
I spoke with the pt's sister and made her aware of Dr Jaci Lazier note (see below) she will await further information as we get it.

## 2021-08-07 ENCOUNTER — Other Ambulatory Visit: Payer: Medicaid Other

## 2021-08-07 DIAGNOSIS — K862 Cyst of pancreas: Secondary | ICD-10-CM

## 2021-08-07 DIAGNOSIS — K8689 Other specified diseases of pancreas: Secondary | ICD-10-CM

## 2021-08-07 DIAGNOSIS — R933 Abnormal findings on diagnostic imaging of other parts of digestive tract: Secondary | ICD-10-CM

## 2021-08-07 DIAGNOSIS — K831 Obstruction of bile duct: Secondary | ICD-10-CM

## 2021-08-07 NOTE — Addendum Note (Signed)
Addended by: Memory Argue on: 08/07/2021 09:45 AM   Modules accepted: Orders

## 2021-08-08 LAB — HEPATIC FUNCTION PANEL
ALT: 18 IU/L (ref 0–44)
AST: 8 IU/L (ref 0–40)
Albumin: 4.1 g/dL (ref 4.0–5.0)
Alkaline Phosphatase: 198 IU/L — ABNORMAL HIGH (ref 44–121)
Bilirubin Total: 0.2 mg/dL (ref 0.0–1.2)
Bilirubin, Direct: <0.1 mg/dL (ref 0.00–0.40)
Total Protein: 6.5 g/dL (ref 6.0–8.5)

## 2021-08-22 ENCOUNTER — Ambulatory Visit (HOSPITAL_BASED_OUTPATIENT_CLINIC_OR_DEPARTMENT_OTHER)
Admission: RE | Admit: 2021-08-22 | Discharge: 2021-08-22 | Disposition: A | Discharge status: Home / Self Care | Payer: Medicaid Other | Source: Ambulatory Visit | Attending: Gastroenterology | Admitting: Gastroenterology

## 2021-08-22 ENCOUNTER — Other Ambulatory Visit: Payer: Self-pay

## 2021-08-22 DIAGNOSIS — R933 Abnormal findings on diagnostic imaging of other parts of digestive tract: Secondary | ICD-10-CM | POA: Insufficient documentation

## 2021-08-22 DIAGNOSIS — K831 Obstruction of bile duct: Secondary | ICD-10-CM | POA: Diagnosis present

## 2021-08-22 DIAGNOSIS — K862 Cyst of pancreas: Secondary | ICD-10-CM | POA: Diagnosis present

## 2021-08-22 DIAGNOSIS — K8689 Other specified diseases of pancreas: Secondary | ICD-10-CM | POA: Diagnosis not present

## 2021-08-22 MED ORDER — IOHEXOL 300 MG/ML  SOLN
100.0000 mL | Freq: Once | INTRAMUSCULAR | Status: AC | PRN
  Administered 2021-08-22: 100 mL via INTRAVENOUS

## 2021-09-04 ENCOUNTER — Encounter: Payer: Self-pay | Admitting: Gastroenterology

## 2021-09-04 ENCOUNTER — Other Ambulatory Visit (INDEPENDENT_AMBULATORY_CARE_PROVIDER_SITE_OTHER): Payer: Medicaid Other

## 2021-09-04 ENCOUNTER — Ambulatory Visit: Payer: Medicaid Other | Admitting: Gastroenterology

## 2021-09-04 VITALS — BP 136/84 | HR 70 | Ht 70.0 in | Wt 202.0 lb

## 2021-09-04 DIAGNOSIS — R748 Abnormal levels of other serum enzymes: Secondary | ICD-10-CM

## 2021-09-04 DIAGNOSIS — Z8719 Personal history of other diseases of the digestive system: Secondary | ICD-10-CM

## 2021-09-04 DIAGNOSIS — R933 Abnormal findings on diagnostic imaging of other parts of digestive tract: Secondary | ICD-10-CM

## 2021-09-04 DIAGNOSIS — K8689 Other specified diseases of pancreas: Secondary | ICD-10-CM | POA: Diagnosis not present

## 2021-09-04 DIAGNOSIS — K831 Obstruction of bile duct: Secondary | ICD-10-CM

## 2021-09-04 DIAGNOSIS — K9189 Other postprocedural complications and disorders of digestive system: Secondary | ICD-10-CM | POA: Diagnosis not present

## 2021-09-04 DIAGNOSIS — R932 Abnormal findings on diagnostic imaging of liver and biliary tract: Secondary | ICD-10-CM

## 2021-09-04 DIAGNOSIS — R6881 Early satiety: Secondary | ICD-10-CM

## 2021-09-04 DIAGNOSIS — K859 Acute pancreatitis without necrosis or infection, unspecified: Secondary | ICD-10-CM

## 2021-09-04 LAB — CBC
HCT: 53.3 % — ABNORMAL HIGH (ref 39.0–52.0)
Hemoglobin: 17.9 g/dL — ABNORMAL HIGH (ref 13.0–17.0)
MCHC: 33.6 g/dL (ref 30.0–36.0)
MCV: 88.3 fl (ref 78.0–100.0)
Platelets: 289 10*3/uL (ref 150.0–400.0)
RBC: 6.03 Mil/uL — ABNORMAL HIGH (ref 4.22–5.81)
RDW: 13.8 % (ref 11.5–15.5)
WBC: 10.5 10*3/uL (ref 4.0–10.5)

## 2021-09-04 LAB — COMPREHENSIVE METABOLIC PANEL
ALT: 24 U/L (ref 0–53)
AST: 14 U/L (ref 0–37)
Albumin: 4.6 g/dL (ref 3.5–5.2)
Alkaline Phosphatase: 177 U/L — ABNORMAL HIGH (ref 39–117)
BUN: 12 mg/dL (ref 6–23)
CO2: 28 mEq/L (ref 19–32)
Calcium: 9.8 mg/dL (ref 8.4–10.5)
Chloride: 101 mEq/L (ref 96–112)
Creatinine, Ser: 0.97 mg/dL (ref 0.40–1.50)
GFR: 90.97 mL/min (ref >60.00)
Glucose, Bld: 96 mg/dL (ref 70–99)
Potassium: 4.7 mEq/L (ref 3.5–5.1)
Sodium: 138 mEq/L (ref 135–145)
Total Bilirubin: 0.5 mg/dL (ref 0.2–1.2)
Total Protein: 7.9 g/dL (ref 6.0–8.3)

## 2021-09-04 LAB — PHOSPHORUS: Phosphorus: 4.1 mg/dL (ref 2.3–4.6)

## 2021-09-04 LAB — C-REACTIVE PROTEIN: CRP: 1 mg/dL (ref 0.5–20.0)

## 2021-09-04 LAB — LIPASE: Lipase: 57 U/L (ref 11.0–59.0)

## 2021-09-04 LAB — MAGNESIUM: Magnesium: 2.3 mg/dL (ref 1.5–2.5)

## 2021-09-04 MED ORDER — OXYCODONE HCL 5 MG PO TABS: 5.0000 mg | ORAL_TABLET | ORAL | 0 refills | Status: AC | PRN

## 2021-09-04 MED ORDER — PANCRELIPASE (LIP-PROT-AMYL) 36000-114000 UNITS PO CPEP: ORAL_CAPSULE | ORAL | 11 refills | Status: DC

## 2021-09-04 NOTE — Patient Instructions (Signed)
You will be contacted by [facility] in the next 2 days to arrange a Gastric Emptying Scan. The number on your caller ID will be [phone], please answer when they call.  If you have not heard from them in 2 days please call [phone] to schedule.   ? ?Your provider has requested that you go to the basement level for lab work before leaving today. Press "B" on the elevator. The lab is located at the first door on the left as you exit the elevator. ? ?We have sent the following medications to your pharmacy for you to pick up at your convenience: ?Oxycodone  ?Creon- Take 3 capsules with each meal and 1-2 capsules with snacks daily.  ? ? ?If you are age 51 or younger, your body mass index should be between 19-25. Your Body mass index is 28.98 kg/m?Marland Kitchen If this is out of the aformentioned range listed, please consider follow up with your Primary Care Provider.  ? ?________________________________________________________ ? ?The [facility] providers would like to encourage you to use [facility] to communicate with providers for non-urgent requests or questions.  Due to long hold times on the telephone, sending your provider a message by [facility] may be a faster and more efficient way to get a response.  Please allow 48 business hours for a response.  Please remember that this is for non-urgent requests.  ?_______________________________________________________ ? ?Thank you for choosing me and [facility]. ? ?Dr. Rush Landmark ? ?

## 2021-09-05 ENCOUNTER — Encounter: Payer: Self-pay | Admitting: Gastroenterology

## 2021-09-05 ENCOUNTER — Telehealth: Payer: Self-pay | Admitting: Gastroenterology

## 2021-09-05 DIAGNOSIS — Z8719 Personal history of other diseases of the digestive system: Secondary | ICD-10-CM | POA: Insufficient documentation

## 2021-09-05 LAB — CALCIUM, IONIZED: Calcium, Ion: 5.1 mg/dL (ref 4.8–5.6)

## 2021-09-05 NOTE — Telephone Encounter (Signed)
Inbound call from patient stated that he tried to pick up Roxicodone from the pharmacy and they stated they could not fill the proscription. Patient stated that they made it sound like Dr. Silvestre Moment could not proscribe that to him. Patient is seeking advice as to what he needs to do to get medication. Please advise.   ?

## 2021-09-05 NOTE — Progress Notes (Signed)
[place] VISIT   Primary Care Provider Ronnie Doss [place], DO [place] [address] [phone]  Patient Profile: Steve Romero is a 51 y.o. male with a pmh significant for bipolar disorder, schizoaffective disorder, hypertension, hyperlipidemia, OSA, single episode of pancreatitis secondary to gallstone disease with subsequent cholecystectomy, complicated cholecystectomy with bile leak and subsequent severe post ERCP necrotizing pancreatitis (prior walled off necrosis/pseudocyst), persistent smoldering pancreatitis with complications of biliary obstruction (status post biliary stenting with biliary stent now removed) and recurrent pancreatic pseudocyst with concern for pancreatic duct disruption (status post repeat drainage and with PD ending in the neck of pancreas on pancreatogram), chronic abdominal discomfort, colon polyps (TAs).  The patient presents to the [facility] for an evaluation and management of problem(s) noted below:  Problem List 1. History of pancreatitis   2. Post-ERCP acute pancreatitis   3. Pancreatic duct disruption   4. Biliary stricture   5. Early satiety   6. Abnormal CT scan, gastrointestinal tract   7. Elevated alkaline phosphatase level   8. Abnormal CT of liver     History of Present Illness Please see prior notes for full details of HPI.  Interval History The patient returns for follow-up.  He is accompanied by his mother and sister.  Since her last clinic visit, he underwent an EUS with cystgastrostomy creation with cyst decompression.  He subsequently in January underwent with me a repeat ERCP to try and remove the biliary stent.  He began to unravel.  We had issues with this and ended the procedure after removing the AXIOS stent.  He was referred to [facility] advanced endoscopy.  They were able to remove the biliary stent.  A partial pancreatic pancreatogram was performed but could not see any  evidence of the PD beyond the neck of the pancreas.  Repeat cross-sectional imaging showed no evidence of recurrence of the pancreatic duct cyst.  After the procedure, the patient over the last weekend and few days has been having progressive abdominal discomfort.  Issues were significant on Saturday and Sunday.  He has begun to slowly increase his diet.  He is having discomfort in the mid abdomen going into his right side and back.  He is noting acholic stools which have increased since his biliary stent was removed.  He denies pruritus.  He feels that his stools are more greasy than normal.  He is taking 2 Creon capsules with each meal and 1 with each snack.  He denies any blood in his stools.  He states his urine has been darker today even though he has been drinking enough fluid.  GI Review of Systems Positive as above including nausea at times and bloating at times Negative for vomiting, dysphagia, odynophagia, melena, hematochezia  Review of Systems General: Denies fevers/chills Cardiovascular: Denies chest pain Pulmonary: Denies shortness of breath Gastroenterological: See HPI Genitourinary: As per HPI Hematological: Denies easy bruising/bleeding Dermatological: Denies jaundice Psychological: Mood is stable   Medications Current Outpatient Medications  Medication Sig Dispense Refill   esomeprazole (NEXIUM) 40 MG capsule Take 1 capsule (40 mg total) by mouth daily at 12 noon. 30 capsule 6   oxyCODONE (ROXICODONE) 5 MG immediate release tablet Take 1 tablet (5 mg total) by mouth every 4 (four) hours as needed for up to 5 days for severe pain. 30 tablet 0   lipase/protease/amylase (CREON) 36000 UNITS CPEP capsule 3 capsules with each meal and 1-2 capsules with each snack (up too 2 snacks 540  capsule 11   No current facility-administered medications for this visit.    Allergies Allergies  Allergen Reactions   Desipramine Nausea Only and Rash    Histories Past Medical History:   Diagnosis Date   Acute pancreatitis without necrosis or infection, unspecified    Anxiety 10/2014   Ascites    Bipolar disorder [facility]) age 39   COPD (chronic obstructive pulmonary disease) ([place]) 2016   Depression age 71   Enlarged prostate    Hyperlipidemia 2016   Hypertension 2013   Schizoaffective disorder ([place]) 11/13/2014   Sleep apnea    Does not wear c-pap, sleeps in sitting up position per pt   Thyroid disease 11/2014   Past Surgical History:  Procedure Laterality Date   BALLOON DILATION N/A 11/07/2020   Procedure: BALLOON DILATION;  Surgeon: Irving Copas., MD;  Location: [place];  Service: Gastroenterology;  Laterality: N/A;   BALLOON DILATION N/A 06/26/2021   Procedure: BALLOON DILATION;  Surgeon: Rush Landmark Telford Nab., MD;  Location: [place];  Service: Gastroenterology;  Laterality: N/A;   BILIARY DILATION  07/24/2021   Procedure: BILIARY DILATION;  Surgeon: Rush Landmark Telford Nab., MD;  Location: [place];  Service: Gastroenterology;;   BILIARY STENT PLACEMENT N/A 10/10/2020   Procedure: BILIARY STENT PLACEMENT;  Surgeon: Rogene Houston, MD;  Location: [facility];  Service: Endoscopy;  Laterality: N/A;   BILIARY STENT PLACEMENT  11/09/2020   Procedure: BILIARY STENT PLACEMENT;  Surgeon: Rush Landmark Telford Nab., MD;  Location: [place];  Service: Gastroenterology;;   BILIARY STENT PLACEMENT  11/07/2020   Procedure: BILIARY STENT PLACEMENT;  Surgeon: Irving Copas., MD;  Location: [place];  Service: Gastroenterology;;   BILIARY STENT PLACEMENT  11/11/2020   Procedure: BILIARY STENT PLACEMENT;  Surgeon: Irving Copas., MD;  Location: [place];  Service: Gastroenterology;;   BILIARY STENT PLACEMENT  11/14/2020   Procedure: BILIARY STENT PLACEMENT;  Surgeon: Irving Copas., MD;  Location: [place];  Service: Gastroenterology;;   BILIARY STENT PLACEMENT N/A 01/29/2021   Procedure: BILIARY STENT PLACEMENT;   Surgeon: Irving Copas., MD;  [facility];  Service: Gastroenterology;  Laterality: N/A;   BILIARY STENT PLACEMENT  06/26/2021   Procedure: BILIARY STENT PLACEMENT;  Surgeon: Rush Landmark Telford Nab., MD;  Location: [place];  Service: Gastroenterology;;   BILIARY STENT PLACEMENT  07/24/2021   Procedure: BILIARY STENT PLACEMENT;  Surgeon: Irving Copas., MD;  Location: [place];  Service: Gastroenterology;;   BIOPSY  11/07/2020   Procedure: BIOPSY;  Surgeon: Irving Copas., MD;  Location: [place];  Service: Gastroenterology;;   BIOPSY  12/20/2020   Procedure: BIOPSY;  Surgeon: Irving Copas., MD;  Location: [facility];  Service: Gastroenterology;;   BIOPSY  06/26/2021   Procedure: BIOPSY;  Surgeon: Irving Copas., MD;  Location: [place];  Service: Gastroenterology;;   CARPAL TUNNEL RELEASE Left 02/21/2016   Procedure: CARPAL TUNNEL RELEASE;  Surgeon: Carole Civil, MD;  Location: [facility];  Service: Orthopedics;  Laterality: Left;   CHOLECYSTECTOMY N/A 10/09/2020   Procedure: LAPAROSCOPIC CHOLECYSTECTOMY;  Surgeon: Virl Cagey, MD;  Location: [facility];  Service: General;  Laterality: N/A;   CYST ENTEROSTOMY N/A 11/07/2020   Procedure: CYST ENTEROSTOMY;  Surgeon: Irving Copas., MD;  Location: [place];  Service: Gastroenterology;  Laterality: N/A;   CYST GASTROSTOMY  11/11/2020   Procedure: CYST NECROSECTOMY;  Surgeon: Rush Landmark Telford Nab., MD;  Location: [place];  Service: Gastroenterology;;   CYST GASTROSTOMY  11/14/2020  Procedure: CYST NECROSECTOMY;  Surgeon: Rush Landmark Telford Nab., MD;  Location: [place];  Service: Gastroenterology;;   CYST REMOVAL HAND     CYSTOSCOPY  06/26/2021   Procedure: CYSTOGASTROSTOMY;  Surgeon: Mansouraty, Telford Nab., MD;  Location: [facility];  Service: Gastroenterology;;   ENDOSCOPIC RETROGRADE CHOLANGIOPANCREATOGRAPHY (ERCP) WITH PROPOFOL N/A  11/09/2020   Procedure: ENDOSCOPIC RETROGRADE CHOLANGIOPANCREATOGRAPHY (ERCP) WITH PROPOFOL;  Surgeon: Irving Copas., MD;  Location: [place];  Service: Gastroenterology;  Laterality: N/A;   ENDOSCOPIC RETROGRADE CHOLANGIOPANCREATOGRAPHY (ERCP) WITH PROPOFOL N/A 12/20/2020   Procedure: ENDOSCOPIC RETROGRADE CHOLANGIOPANCREATOGRAPHY (ERCP) WITH PROPOFOL;  Surgeon: Rush Landmark Telford Nab., MD;  Location: [facility];  Service: Gastroenterology;  Laterality: N/A;   ENDOSCOPIC RETROGRADE CHOLANGIOPANCREATOGRAPHY (ERCP) WITH PROPOFOL N/A 01/29/2021   Procedure: ENDOSCOPIC RETROGRADE CHOLANGIOPANCREATOGRAPHY (ERCP) WITH PROPOFOL;  Surgeon: Rush Landmark Telford Nab., MD;  [facility];  Service: Gastroenterology;  Laterality: N/A;   ENDOSCOPIC RETROGRADE CHOLANGIOPANCREATOGRAPHY (ERCP) WITH PROPOFOL N/A 07/24/2021   Procedure: ENDOSCOPIC RETROGRADE CHOLANGIOPANCREATOGRAPHY (ERCP) WITH PROPOFOL;  Surgeon: Rush Landmark Telford Nab., MD;  Location: [place];  Service: Gastroenterology;  Laterality: N/A;   ERCP N/A 10/10/2020   Procedure: ENDOSCOPIC RETROGRADE CHOLANGIOPANCREATOGRAPHY (ERCP);  Surgeon: Rogene Houston, MD;  [facility];  Service: Endoscopy;  Laterality: N/A;   ERCP     ESOPHAGOGASTRODUODENOSCOPY N/A 11/11/2020   Procedure: ESOPHAGOGASTRODUODENOSCOPY (EGD);  Surgeon: Irving Copas., MD;  Location: [place];  Service: Gastroenterology;  Laterality: N/A;   ESOPHAGOGASTRODUODENOSCOPY N/A 11/14/2020   Procedure: ESOPHAGOGASTRODUODENOSCOPY (EGD);  Surgeon: Irving Copas., MD;  Location: [place];  Service: Gastroenterology;  Laterality: N/A;   ESOPHAGOGASTRODUODENOSCOPY (EGD) WITH PROPOFOL N/A 11/09/2020   Procedure: ESOPHAGOGASTRODUODENOSCOPY (EGD) WITH PROPOFOL;  Surgeon: Rush Landmark Telford Nab., MD;  Location: [place];  Service: Gastroenterology;  Laterality: N/A;   ESOPHAGOGASTRODUODENOSCOPY (EGD) WITH PROPOFOL N/A 11/07/2020    Procedure: ESOPHAGOGASTRODUODENOSCOPY (EGD) WITH PROPOFOL;  Surgeon: Rush Landmark Telford Nab., MD;  Location: [place];  Service: Gastroenterology;  Laterality: N/A;   ESOPHAGOGASTRODUODENOSCOPY (EGD) WITH PROPOFOL N/A 12/20/2020   Procedure: ESOPHAGOGASTRODUODENOSCOPY (EGD) WITH PROPOFOL;  Surgeon: Rush Landmark Telford Nab., MD;  [facility];  Service: Gastroenterology;  Laterality: N/A;   ESOPHAGOGASTRODUODENOSCOPY (EGD) WITH PROPOFOL N/A 06/26/2021   Procedure: ESOPHAGOGASTRODUODENOSCOPY (EGD) WITH PROPOFOL;  Surgeon: Rush Landmark Telford Nab., MD;  Location: [place];  Service: Gastroenterology;  Laterality: N/A;   ESOPHAGOGASTRODUODENOSCOPY (EGD) WITH PROPOFOL N/A 07/24/2021   Procedure: ESOPHAGOGASTRODUODENOSCOPY (EGD) WITH PROPOFOL;  Surgeon: Rush Landmark Telford Nab., MD;  Location: [place];  Service: Gastroenterology;  Laterality: N/A;  AXIOS STENT   EUS  11/14/2020   Procedure: UPPER ENDOSCOPIC ULTRASOUND (EUS) LINEAR;  Surgeon: Irving Copas., MD;  Location: [place];  Service: Gastroenterology;;   EUS N/A 06/26/2021   Procedure: UPPER ENDOSCOPIC ULTRASOUND (EUS) RADIAL;  Surgeon: Irving Copas., MD;  Location: [place];  Service: Gastroenterology;  Laterality: N/A;   GASTROINTESTINAL STENT REMOVAL  12/20/2020   Procedure: GASTROINTESTINAL STENT REMOVAL;  Surgeon: Irving Copas., MD;  [facility];  Service: Gastroenterology;;  cyst gastrostomy stent and double pig tail stents x2 removed   HERNIA REPAIR Left 2002   groin   INGUINAL HERNIA REPAIR Left 04/05/2017   Procedure: RECURRENT HERNIA REPAIR INGUINAL ADULT WITH MESH;  Surgeon: [name], MD;  Location[facility];  Service: General;  Laterality: Left;   MASS EXCISION Right 04/29/2020   Procedure: EXCISION MASS RIGHT WRIST;  Surgeon: [name], MD;  Location: [place];  Service: Orthopedics;  Laterality: Right;   MASS EXCISION Right 10/09/2020  Procedure: EXCISION MASS, ABDOMINAL WALL, 2CM;  Surgeon: Virl Cagey, MD;  Location[facility];  Service: General;  Laterality: Right;   PANCREATIC STENT PLACEMENT  06/26/2021   Procedure: AXIOS STENT PLACEMENT;  Surgeon: Irving Copas., MD;  Location: [place];  Service: Gastroenterology;;   REMOVAL OF STONES  12/20/2020   Procedure: REMOVAL OF STONES;  Surgeon: Irving Copas., MD;  [facility];  Service: Gastroenterology;;   REMOVAL OF STONES  07/24/2021   Procedure: REMOVAL OF STONES;  Surgeon: Irving Copas., MD;  Location: [place];  Service: Gastroenterology;;   Joan Mayans N/A 10/10/2020   Procedure: Joan Mayans;  Surgeon: Rogene Houston, MD;  Location: [facility];  Service: Endoscopy;  Laterality: N/A;   SPHINCTEROTOMY  07/24/2021   Procedure: SPHINCTEROTOMY;  Surgeon: Mansouraty, Telford Nab., MD;  Location: [place];  Service: Gastroenterology;;   Bess Kinds CHOLANGIOSCOPY N/A 12/20/2020   Procedure: NOIBBCWU CHOLANGIOSCOPY;  Surgeon: Irving Copas., MD;  [facility];  Service: Gastroenterology;  Laterality: N/A;   SPYGLASS CHOLANGIOSCOPY N/A 07/24/2021   Procedure: SPYGLASS CHOLANGIOSCOPY;  Surgeon: Irving Copas., MD;  Location: [place];  Service: Gastroenterology;  Laterality: N/A;   STENT REMOVAL  11/09/2020   Procedure: STENT REMOVAL;  Surgeon: Irving Copas., MD;  Location: [place];  Service: Gastroenterology;;   Lavell Islam REMOVAL  11/11/2020   Procedure: STENT REMOVAL;  Surgeon: Irving Copas., MD;  Location: [place];  Service: Gastroenterology;;   Lavell Islam REMOVAL  11/14/2020   Procedure: STENT REMOVAL;  Surgeon: Irving Copas., MD;  Location: [place];  Service: Gastroenterology;;   Lavell Islam REMOVAL  12/20/2020   Procedure: STENT REMOVAL;  Surgeon: Irving Copas., MD;  Location[facility];  Service: Gastroenterology;;  biliary x2   STENT REMOVAL   07/24/2021   Procedure: AXIOS STENT REMOVAL;  Surgeon: Irving Copas., MD;  Location: [place];  Service: Gastroenterology;;   UPPER ESOPHAGEAL ENDOSCOPIC ULTRASOUND (EUS) N/A 11/07/2020   Procedure: UPPER ESOPHAGEAL ENDOSCOPIC ULTRASOUND (EUS);  Surgeon: Irving Copas., MD;  Location: [place];  Service: Gastroenterology;  Laterality: N/A;   UPPER GASTROINTESTINAL ENDOSCOPY     WOUND DEBRIDEMENT  11/09/2020   Procedure: CYST NECROSECTOMY;  Surgeon: Rush Landmark, Telford Nab., MD;  [facility];  Service: Gastroenterology;;   Social History   Socioeconomic History   Marital status: Single    Spouse name: Not on file   Number of children: Not on file   Years of education: Not on file   Highest education level: Not on file  Occupational History   Not on file  Tobacco Use   Smoking status: Every Day    Packs/day: 0.25    Years: 26.00    Pack years: 6.50    Types: Cigarettes   Smokeless tobacco: Never   Tobacco comments:    4-5 cig daily as of 10/07/2020.  Vaping Use   Vaping Use: Never used  Substance and Sexual Activity   Alcohol use: No   Drug use: No   Sexual activity: Never    Birth control/protection: None  Other Topics Concern   Not on file  Social History Narrative   Not on file   Social Determinants of Health   Financial Resource Strain: Not on file  Food Insecurity: Not on file  Transportation Needs: Not on file  Physical Activity: Not on file  Stress: Not on file  Social Connections: Not on file  Intimate Partner Violence: Not on file   Family History  Problem  Relation Age of Onset   Diabetes Father    Heart disease Father    Heart disease Maternal Grandmother    Stroke Maternal Grandfather    Colon cancer Neg Hx    Esophageal cancer Neg Hx    Inflammatory bowel disease Neg Hx    Liver disease Neg Hx    Pancreatic cancer Neg Hx    Rectal cancer Neg Hx    Stomach cancer Neg Hx    I have reviewed his medical, social,  and family history in detail and updated the electronic medical record as necessary.    PHYSICAL EXAMINATION  BP 136/84    Pulse 70    Ht 5' 10"  (1.778 m)    Wt 202 lb (91.6 kg)    BMI 28.98 kg/m  Wt Readings from Last 3 Encounters:  09/04/21 202 lb (91.6 kg)  07/24/21 200 lb (90.7 kg)  06/26/21 200 lb (90.7 kg)  GEN: NAD, appears stated age, doesn't appear chronically ill, accompanied by sister and mother PSYCH: Cooperative, without pressured speech EYE: Anicteric sclerae ENT: MMM CV: Nontachycardic RESP: No audible wheezing GI: NABS, soft, protuberant abdomen, TTP in mid abdomen and right upper quadrant, no rebound but some mild volitional guarding is present  MSK/EXT: No significant lower extremity edema today SKIN: No jaundice NEURO:  Alert & Oriented x 3, no focal deficits   REVIEW OF DATA  I reviewed the following data at the time of this encounter:  GI Procedures and Studies  December 2022 EUS EGD Impression: - White nummular lesions in esophageal mucosa. Biopsied. - Z-line irregular, 44 cm from the incisors. - Scar at the incisura from prior cystgastrostomy. - Gastritis. Biopsied. - Erythematous duodenopathy. - Congested papilla with biliary stent that has migrated inwards proximally into the bile duct. EUS Impression: - A cystic lesion was seen in the pancreatic head and genu of the pancreas. Tissue has not been obtained. However, the endosonographic appearance is consistent with a pancreatic pseudocyst. This is causing biliary compression at the distal duct on EUS. AXIOS cystgastrostomy created and double pigtail placed. - One stent was visualized endosonographically in the common bile duct but it has migrated inwards. - No malignant-appearing lymph nodes were visualized in the perigastric region and peripancreatic region.  January 2023 ERCP - No gross lesions in esophagus. - A large amount of food (residue) in the stomach. - AXIOS and double pigtail  cystgastrostomy stents in place and removed. - Erythematous duodenopathy. - Prior biliary sphincterotomy appeared open but the prior metal stent had migrated into the duct. - The fluoroscopic examination was suspicious for sludge. - The biliary tree was swept and sludge was found. - I extended the biliary sphincterotomy to try and help get the biliary stent out. - I performed a sphincteroplasty to try and help get the biliary stent out. - It could not be removed with balloon sweep/forcep retrieval. - I tried the Spyscope forceps to grab the stent and this was partially successful to bring stent down further but could not remove it completely. - Final attempts at pulling the stent began to cause unraveling. - One plastic biliary stent was placed into the common bile duct through the biliary stent that has migrated to ensure biliary patency.  February 2023 ERCP [facility] Impression: - One visibly patent plastic biliary stent from the  biliary tree was seen in the major papilla. This was  removed. - A previously placed metal stent had migrated into  the biliary tree. This was ultimately removed  after  dilation of the bile duct orifice, balloon drag and  removal with stent grabber. - Prior biliary sphincterotomy appeared open. - The common bile duct was dilated. The biliary tree  was swept and sludge was found. Occlusion  cholangiogram confirmed a clear duct and contrast  drained well therefore a stent was not replaced. - A mild irregularity was found in the ventral  pancreatic duct in the head of the pancreas. Deep wire  access into the ventral pancreatic duct was unable to  be achieved. The pancreatic duct in the head measured  3.5 mm in diameter.  Laboratory Studies  Reviewed those in epic  Imaging Studies  February 2023 CT abdomen IMPRESSION: 1. Interval removal of the pancreatic to pancreatic stent. Probable residual pancreatitis within the body/neck junction, without recurrent  pseudocyst. 2. Upstream pancreatic body and tail duct dilatation, minimally improved. 3. Possible mild secondary gastritis at the site of prior pancreatic drain. 4. Biliary stent in place with pneumobilia and no biliary duct dilatation. 5. Chronic splenic vein insufficiency.   ASSESSMENT  Mr. Pavao is a 51 y.o. male with a pmh significant for bipolar disorder, schizoaffective disorder, hypertension, hyperlipidemia, OSA, single episode of pancreatitis secondary to gallstone disease with subsequent cholecystectomy, complicated cholecystectomy with bile leak and subsequent severe post ERCP necrotizing pancreatitis (prior walled off necrosis/pseudocyst), persistent smoldering pancreatitis with complications of biliary obstruction (status post biliary stenting with biliary stent now removed) and recurrent pancreatic pseudocyst with concern for pancreatic duct disruption (status post repeat drainage and with PD ending in the neck of pancreas on pancreatogram), chronic abdominal discomfort, colon polyps (TAs).  The patient is seen today for evaluation and management of:  1. History of pancreatitis   2. Post-ERCP acute pancreatitis   3. Pancreatic duct disruption   4. Biliary stricture   5. Early satiety   6. Abnormal CT scan, gastrointestinal tract   7. Elevated alkaline phosphatase level   8. Abnormal CT of liver    The patient is hemodynamically stable.  Clinically, I suspect he has had some post ERCP pancreatitis after his recent biliary stent removal and partial pancreatogram.  I am going to give the patient 5 days worth of oxycodone that he may use sparingly to try to help get him through this.  That I suspect is occurring.  I am going to go ahead and obtain labs as well to ensure no evidence of significant electrolyte abnormalities.  Certainly am hopeful that this.  Does not lead to any other issues though if issues persist we will need to consider repeat cross-sectional imaging.  I am going  to hope that he has not developed recurrent biliary stricturing that requires repeat biliary stenting.  We are going to increase his Creon to see if that helps with his stools.  I am most concerned that he will potentially have repeat cyst formation suggestive of a pancreatic duct leak.  I am certainly happy to try to see if I can get a wire to pass the area if this recurs, but at that point I suspect we will need our surgical colleagues to consider surgical cystgastrostomy creation versus other interventions.  If biliary stricturing becomes an issue again we will need to consider repeat stenting.  Time will tell.  Plan to repeat imaging in 2 months and seeing him in clinic.  All patient questions were answered to the best of my ability, and the patient agrees to the aforementioned plan of action with follow-up as indicated.   PLAN  Laboratories as outlined below Oxycodone every 4 hours x5 days total prescription for likely post ERCP pancreatitis treatment - No refills to be considered thereafter by me or Orestes Gastric emptying study to evaluate early satiety to be scheduled Plan for repeat CT abdomen in 2 months to be scheduled Increase Creon - 3 capsules with each meal and 1 to 2 capsules with each snack (36,000 lipase units) Continue PPI daily Minimize fat intake as much as possible - Baked/broiled/grilled/saut but do not fry - Continue to work on intake of protein shakes 2-3 times per day Repeat colonoscopy this year due to only fair preparation   Orders Placed This Encounter  Procedures   NM Gastric Emptying   CBC   Comp Met (CMET)   Calcium, ionized   Lipase   Magnesium   Phosphorus   C-reactive protein    New Prescriptions   OXYCODONE (ROXICODONE) 5 MG IMMEDIATE RELEASE TABLET    Take 1 tablet (5 mg total) by mouth every 4 (four) hours as needed for up to 5 days for severe pain.   Modified Medications   Modified Medication Previous Medication   LIPASE/PROTEASE/AMYLASE  (CREON) 36000 UNITS CPEP CAPSULE lipase/protease/amylase (CREON) 36000 UNITS CPEP capsule      3 capsules with each meal and 1-2 capsules with each snack (up too 2 snacks    2-3 capsules with each meal and 1-2 capsules with each snack (up too 2 snacks    Planned Follow Up No follow-ups on file.   Total Time in Face-to-Face and in Coordination of Care for patient including independent/personal interpretation/review of prior testing, medical history, examination, medication adjustment, communicating results with the patient directly, and documentation with the EHR is 30 minutes.   Justice Britain, MD [facility] Office # [phone]    5 PM addendum Patient's laboratories were evaluated and he has evidence of jaundice at this time.  Leukocytosis is the best it has been over the course of last 6 weeks.  Will initiate ciprofloxacin 500 mg twice daily as prophylaxis against cholangitis.  If the patient develops progressive fevers (greater than 100.3) or chills or worsening symptoms he will come into the hospital after calling to update [place].  He may require ERCP earlier in the next week if that is the case.  I spoke with the patient and patient's sister about this and they agree with this plan of action

## 2021-09-12 NOTE — Telephone Encounter (Signed)
Steve Romero called back and stated that he was able to pick up prescription , but had to pay out of pocket for it.  ?

## 2021-09-23 IMAGING — CT CT ABD-PELV W/ CM
2 of 5 series · 15 of 46 positions shown, 17 images · IV contrast (APPLIED)
Comparison: 01/17/2021

CLINICAL DATA: Abdominal pain.  Pancreatic necrosis.

EXAM:
CT ABDOMEN AND PELVIS WITH CONTRAST
TECHNIQUE: Multidetector CT imaging of the abdomen and pelvis was performed
using the standard protocol following bolus administration of
intravenous contrast.
CONTRAST:  60mL OMNIPAQUE IOHEXOL 350 MG/ML SOLN

[Series 2: abd pel w · axial · 0.96mm/px · z∈[+1014,+1484]mm · 12 of 106 slices shown, 14 images]
[im 6/106  soft-tissue]
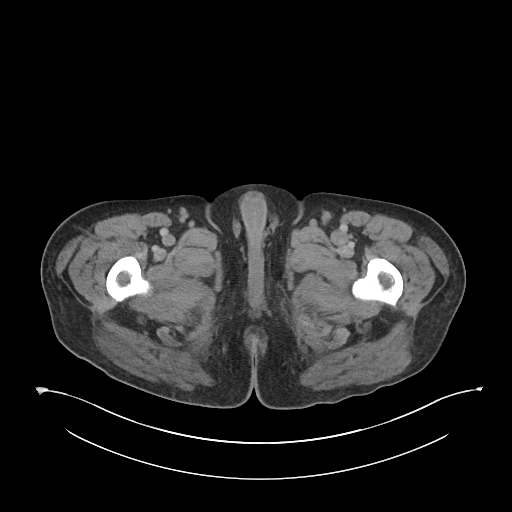
[im 6/106  bone]
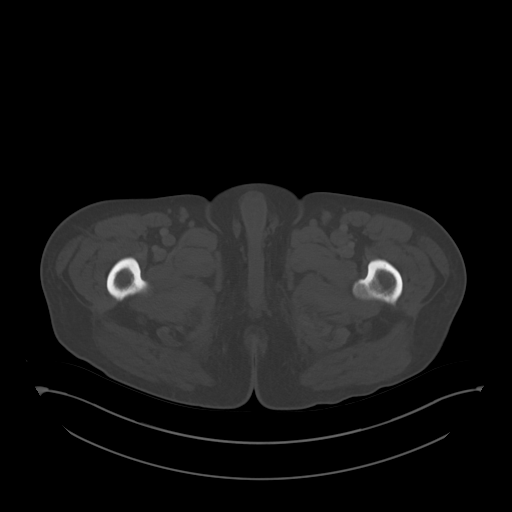
[im 17/106  soft-tissue]
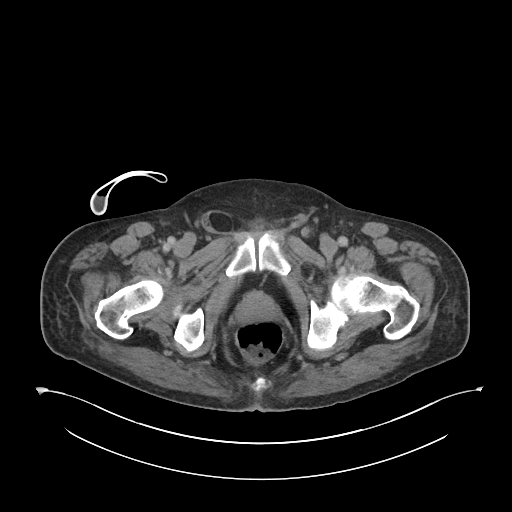
[im 23/106  soft-tissue]
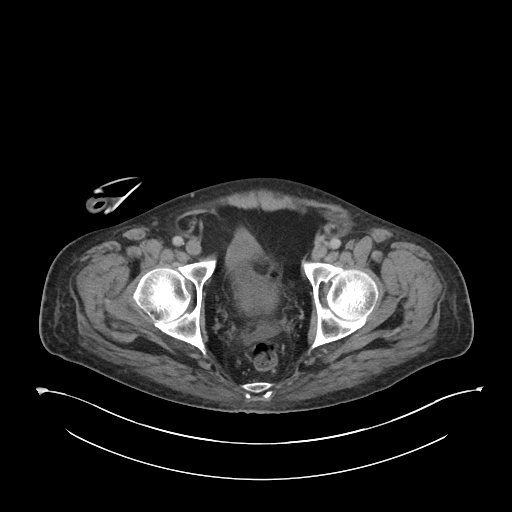
[im 34/106  soft-tissue]
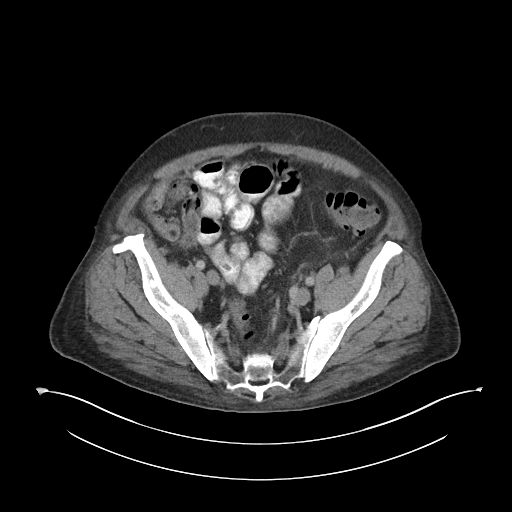
[im 39/106  soft-tissue]
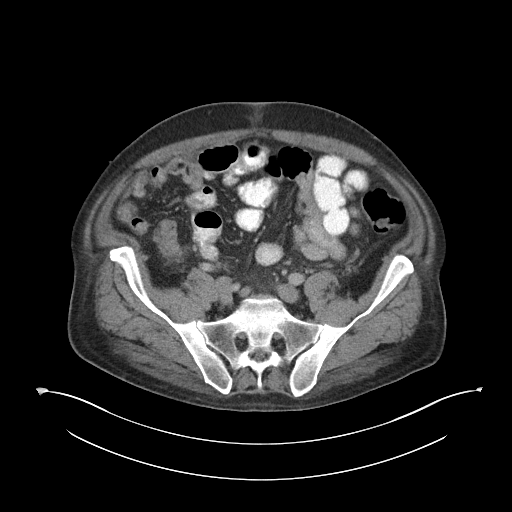
[im 50/106  soft-tissue]
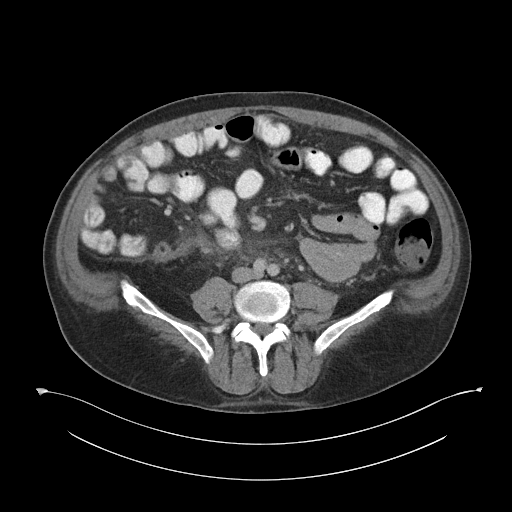
[im 56/106  soft-tissue]
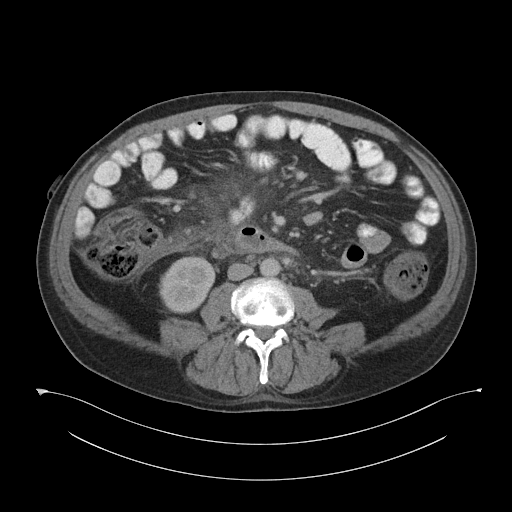
[im 67/106  soft-tissue]
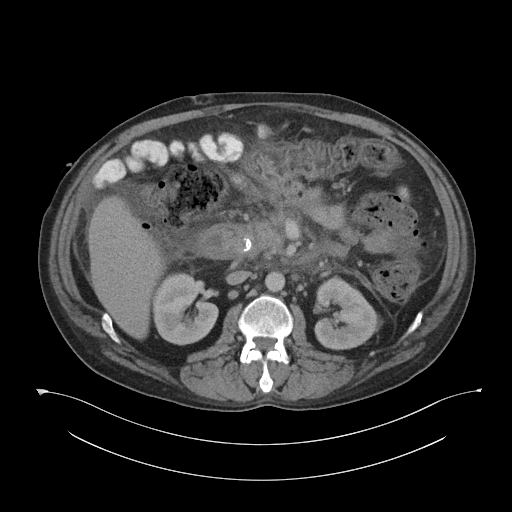
[im 72/106  soft-tissue]
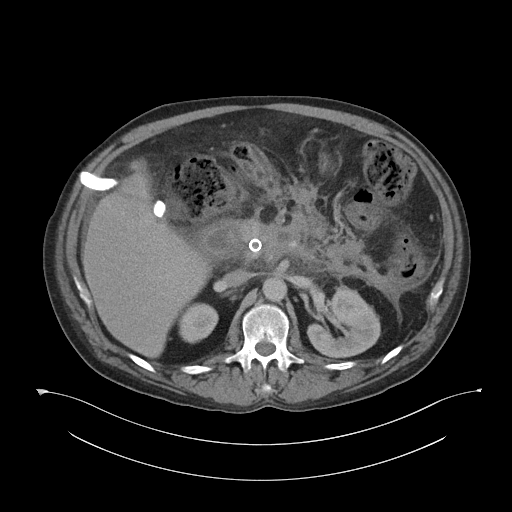
[im 72/106  bone]
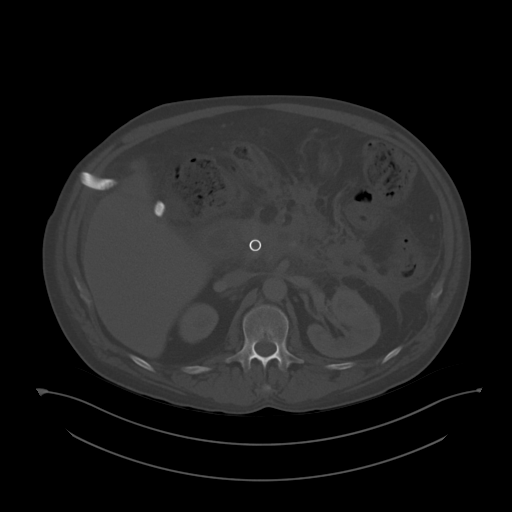
[im 83/106  soft-tissue]
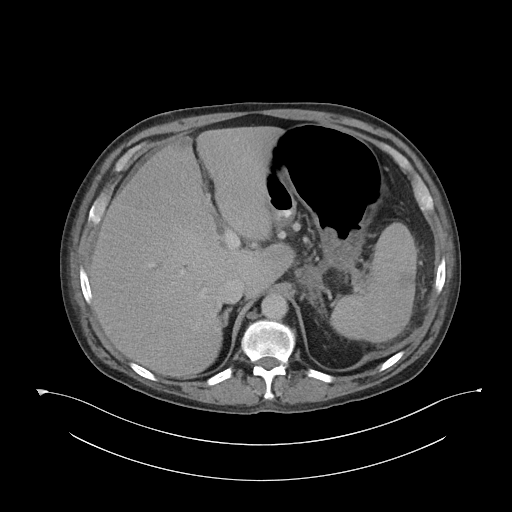
[im 89/106  soft-tissue]
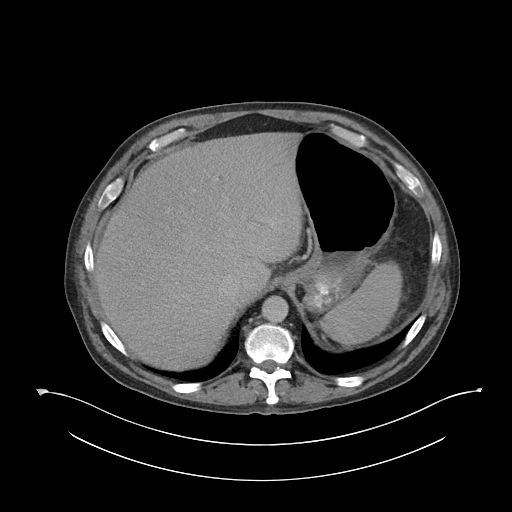
[im 100/106  soft-tissue]
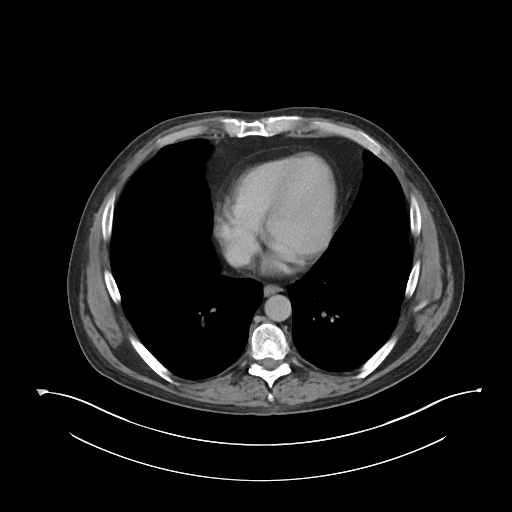

[Series 5: coronal · coronal · 0.84mm/px · 3 of 106 slices shown]
[im 36/106  soft-tissue]
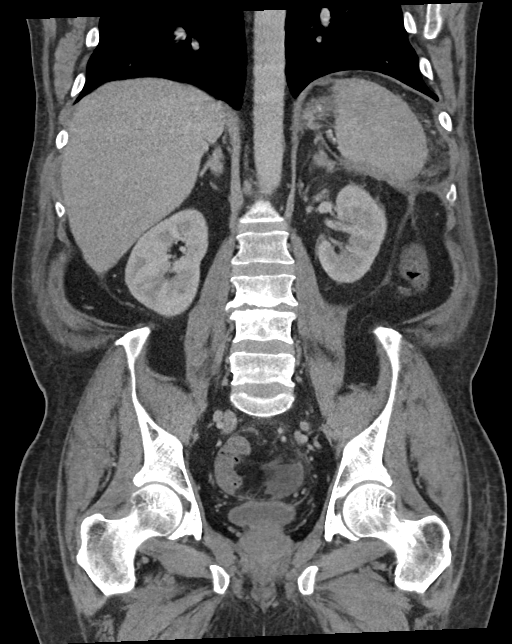
[im 47/106  soft-tissue]
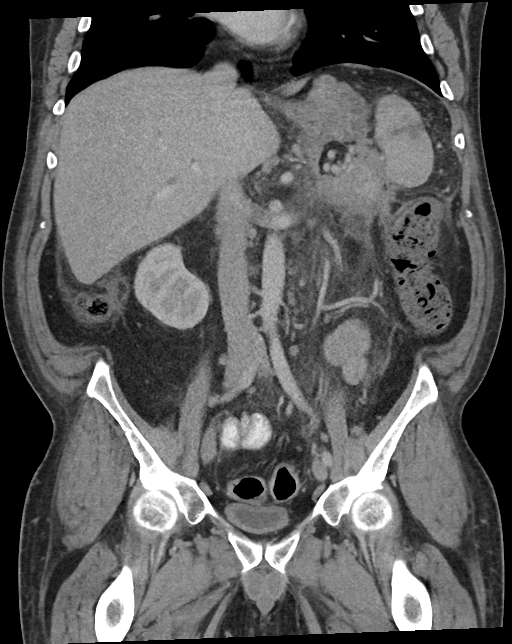
[im 59/106  soft-tissue]
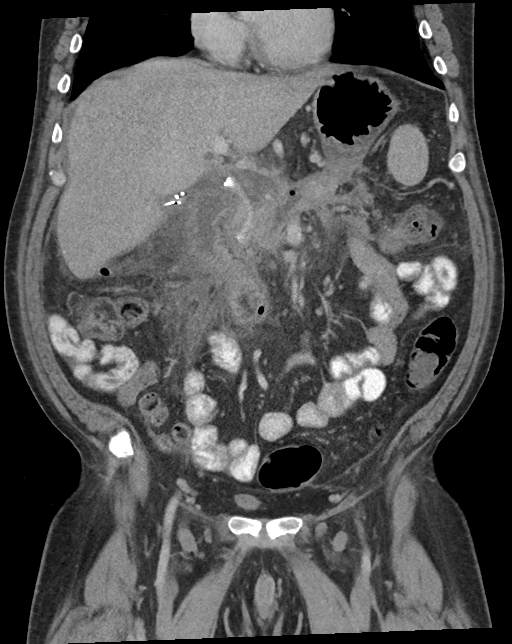

[15 of 46 positions shown; findings below may reference images not displayed]

FINDINGS: Lower chest: Chest unremarkable.

Hepatobiliary: No suspicious focal abnormality within the liver
parenchyma. Mild intrahepatic biliary duct dilatation in the left
liver although decreased since prior. Intrahepatic biliary
dilatation in the right liver has nearly resolved. Common bile duct
stent visualized in situ. Surgical drain again noted in the
gallbladder fossa.

Pancreas: Peripancreatic edema/inflammation is similar to decreased
in the interval. Enhancing pancreatic parenchyma is seen in the tail
of pancreas. Limited enhancing tissue noted in the head of pancreas.
Small 15 x 14 mm rim enhancing collection identified in the body of
pancreas with tiny associated gas locules. Portal splenic confluence
is markedly attenuated with only a string sign of lumen is visible
in the pancreatic head region. Splenic vein patency cannot be
confirmed.

Spleen: Interval evolution of splenic infarct.

Adrenals/Urinary Tract: No adrenal nodule or mass. Kidneys
unremarkable. No evidence for hydroureter. The urinary bladder
appears normal for the degree of distention.

Stomach/Bowel: Stomach is unremarkable. No gastric wall thickening.
No evidence of outlet obstruction. There is circumferential edema in
the region of the descending duodenum No small bowel wall
thickening. No small bowel dilatation. The terminal ileum is normal.
The appendix is not well visualized, but there is no edema or
inflammation in the region of the cecum. Wall thickening and edema
is noted in the right and transverse colon.

Vascular/Lymphatic: No abdominal aortic aneurysm. See pancreas
paragraph above for discussion of portal venous anatomy. Celiac axis
trifurcation not well demonstrated although common hepatic and
splenic arteries appear patent centrally. SMA is unremarkable.

Reproductive: The prostate gland and seminal vesicles are
unremarkable.

Other: Extensive inflammatory changes noted in the transverse
mesocolon tracking into the gastrocolic ligament with
tethering/scarring. Small volume free fluid noted in the pelvis.

Musculoskeletal: Small right groin hernia contains only fat. No
worrisome lytic or sclerotic osseous abnormality.
IMPRESSION: 1. No substantial change in exam.
2. Persistent peripancreatic edema/inflammation extending into the
transverse mesocolon and gastrocolic ligament. There is
circumferential edema/inflammation involving the descending
duodenum, similar to prior with extensive edema in the anterior
pararenal space of the retroperitoneum.
3. 15 x 14 mm collection of fluid and gas is identified in the body
of pancreas in a region of apparent tethering/scarring towards the
posterior wall the stomach. Patient has reported history of fistula
between the stomach in the pancreas.
4. Inflammatory changes in the pancreas markedly attenuates the
portal splenic confluence with only a string sign visible. Superior
mesenteric and portal veins do appear to remain patent. Patency of
the splenic vein cannot be confirmed.
5. Interval decrease in intrahepatic biliary duct dilatation with
common bile duct stent visualized in situ.

## 2021-09-25 ENCOUNTER — Telehealth: Payer: Self-pay | Admitting: Gastroenterology

## 2021-09-25 ENCOUNTER — Emergency Department (HOSPITAL_COMMUNITY): Payer: Medicaid Other

## 2021-09-25 ENCOUNTER — Encounter (HOSPITAL_COMMUNITY): Payer: Self-pay | Admitting: Emergency Medicine

## 2021-09-25 ENCOUNTER — Emergency Department (HOSPITAL_COMMUNITY)
Admission: EM | Admit: 2021-09-25 | Discharge: 2021-09-25 | Disposition: A | Discharge status: Home / Self Care | Payer: Medicaid Other | Attending: Emergency Medicine | Admitting: Emergency Medicine

## 2021-09-25 ENCOUNTER — Other Ambulatory Visit: Payer: Self-pay

## 2021-09-25 DIAGNOSIS — R109 Unspecified abdominal pain: Secondary | ICD-10-CM | POA: Diagnosis present

## 2021-09-25 DIAGNOSIS — R1084 Generalized abdominal pain: Secondary | ICD-10-CM | POA: Diagnosis not present

## 2021-09-25 LAB — COMPREHENSIVE METABOLIC PANEL
ALT: 30 U/L (ref 0–44)
AST: 21 U/L (ref 15–41)
Albumin: 3.9 g/dL (ref 3.5–5.0)
Alkaline Phosphatase: 144 U/L — ABNORMAL HIGH (ref 38–126)
Anion gap: 11 (ref 5–15)
BUN: 12 mg/dL (ref 6–20)
CO2: 21 mmol/L — ABNORMAL LOW (ref 22–32)
Calcium: 9 mg/dL (ref 8.9–10.3)
Chloride: 109 mmol/L (ref 98–111)
Creatinine, Ser: 0.95 mg/dL (ref 0.61–1.24)
GFR, Estimated: >60 mL/min (ref >60)
Glucose, Bld: 99 mg/dL (ref 70–99)
Potassium: 3.6 mmol/L (ref 3.5–5.1)
Sodium: 141 mmol/L (ref 135–145)
Total Bilirubin: 0.8 mg/dL (ref 0.3–1.2)
Total Protein: 6.8 g/dL (ref 6.5–8.1)

## 2021-09-25 LAB — CBC WITH DIFFERENTIAL/PLATELET
Abs Immature Granulocytes: 0.03 10*3/uL (ref 0.00–0.07)
Basophils Absolute: 0 10*3/uL (ref 0.0–0.1)
Basophils Relative: 1 %
Eosinophils Absolute: 0.1 10*3/uL (ref 0.0–0.5)
Eosinophils Relative: 2 %
HCT: 51.8 % (ref 39.0–52.0)
Hemoglobin: 17.7 g/dL — ABNORMAL HIGH (ref 13.0–17.0)
Immature Granulocytes: 0 %
Lymphocytes Relative: 25 %
Lymphs Abs: 1.8 10*3/uL (ref 0.7–4.0)
MCH: 30.2 pg (ref 26.0–34.0)
MCHC: 34.2 g/dL (ref 30.0–36.0)
MCV: 88.2 fL (ref 80.0–100.0)
Monocytes Absolute: 0.8 10*3/uL (ref 0.1–1.0)
Monocytes Relative: 10 %
Neutro Abs: 4.6 10*3/uL (ref 1.7–7.7)
Neutrophils Relative %: 62 %
Platelets: 254 10*3/uL (ref 150–400)
RBC: 5.87 MIL/uL — ABNORMAL HIGH (ref 4.22–5.81)
RDW: 12.8 % (ref 11.5–15.5)
WBC: 7.4 10*3/uL (ref 4.0–10.5)
nRBC: 0 % (ref 0.0–0.2)

## 2021-09-25 LAB — URINALYSIS, ROUTINE W REFLEX MICROSCOPIC
Glucose, UA: NEGATIVE mg/dL
Hgb urine dipstick: NEGATIVE
Ketones, ur: 5 mg/dL — AB
Leukocytes,Ua: NEGATIVE
Nitrite: NEGATIVE
Protein, ur: 30 mg/dL — AB
Specific Gravity, Urine: 1.033 — ABNORMAL HIGH (ref 1.005–1.030)
pH: 5 (ref 5.0–8.0)

## 2021-09-25 LAB — LIPASE, BLOOD: Lipase: 39 U/L (ref 11–51)

## 2021-09-25 MED ORDER — ONDANSETRON 4 MG PO TBDP: ORAL_TABLET | ORAL | 0 refills | Status: DC

## 2021-09-25 MED ORDER — IOHEXOL 300 MG/ML  SOLN
100.0000 mL | Freq: Once | INTRAMUSCULAR | Status: AC | PRN
Start: 2021-09-25 — End: 2021-09-25
  Administered 2021-09-25: 100 mL via INTRAVENOUS

## 2021-09-25 MED ORDER — MORPHINE SULFATE (PF) 4 MG/ML IV SOLN
4.0000 mg | Freq: Once | INTRAVENOUS | Status: AC
  Administered 2021-09-25: 4 mg via INTRAVENOUS
  Filled 2021-09-25: qty 1

## 2021-09-25 MED ORDER — SODIUM CHLORIDE 0.9 % IV BOLUS
1000.0000 mL | Freq: Once | INTRAVENOUS | Status: AC
  Administered 2021-09-25: 1000 mL via INTRAVENOUS

## 2021-09-25 MED ORDER — ONDANSETRON HCL 4 MG/2ML IJ SOLN
4.0000 mg | Freq: Once | INTRAMUSCULAR | Status: AC
  Administered 2021-09-25: 4 mg via INTRAVENOUS
  Filled 2021-09-25: qty 2

## 2021-09-25 MED ORDER — DICYCLOMINE HCL 20 MG PO TABS: 20.0000 mg | ORAL_TABLET | Freq: Two times a day (BID) | ORAL | 0 refills | Status: DC

## 2021-09-25 NOTE — Discharge Instructions (Signed)
Likely your blood work and CT imaging did not show something terrible going on inside your abdomen.  It does not mean that nothing is wrong.  Please follow-up with your GI doctor in the office.  Please return for worsening pain fever or inability to eat or drink.  Please take Imodium follow the instructions on the bottle.  This is bought over-the-counter. ?

## 2021-09-25 NOTE — ED Notes (Signed)
Patient transported to CT 

## 2021-09-25 NOTE — ED Triage Notes (Signed)
Patient c/o mid upper abdominal pain ongoing for a year with worsening over the past week. Reports dark colored urine, emesis and diarrhea over the past week. Hx of pancreatitis.  ?

## 2021-09-25 NOTE — Telephone Encounter (Signed)
The pt sister called to report the pt has had severe abd pain for the past 2 days that he describes is on both sides of the abd.  He says the pain is so bad that he has fallen to his knees twice due to the severity. He says it "feels like some one is twisting a rag" in the abdomen.  He also has had nocturnal fecal incontinence. The sister says she does not think he is jaundiced.  I did make her aware that the pt should go to the ED for evaluation for the severe pain and due to him falling twice because of the pain.  I did tell her I will pass this on to Dr Rush Landmark for further recommendations.  ?

## 2021-09-25 NOTE — Telephone Encounter (Signed)
Agree with this plan of action. ?If he doesn't come into ED by tomorrow, then CBC/CMP/Amylase/Lipase should be performed and can schedule a CT-Abdomen with IV/PO contrast. ?Thanks. ?GM ?

## 2021-09-25 NOTE — ED Provider Notes (Signed)
?[facility] ?Provider Note ? ? ?CSN: [id number] ?Arrival date & time: 09/25/21  1701 ? ?  ? ?History ? ?Chief Complaint  ?Patient presents with  ? Abdominal Pain  ? ? ?Steve Romero is a 51 y.o. male. ? ?51 yo M with a chief complaint of abdominal discomfort.  Hurts worse to the epigastrium and right upper quadrant.  He has had a complicated history of recurrent common bile duct issues.  Has had multiple surgeries for chronic pancreatitis.  Most recently had a common bile duct stent removed at [facility].  Symptoms been worsening over the past week.  Had pain at 1 point that made him double over and almost pass out. ? ? ?Abdominal Pain ? ?  ? ?Home Medications ?Prior to Admission medications   ?Medication Sig Start Date End Date Taking? Authorizing Provider  ?dicyclomine (BENTYL) 20 MG tablet Take 1 tablet (20 mg total) by mouth 2 (two) times daily. 09/25/21  Yes Deno Etienne, DO  ?ondansetron (ZOFRAN-ODT) 4 MG disintegrating tablet '4mg'$  ODT q4 hours prn nausea/vomit 09/25/21  Yes Deno Etienne, DO  ?esomeprazole (NEXIUM) 40 MG capsule Take 1 capsule (40 mg total) by mouth daily at 12 noon. 03/12/21   Mansouraty, Telford Nab., MD  ?lipase/protease/amylase (CREON) 36000 UNITS CPEP capsule 3 capsules with each meal and 1-2 capsules with each snack (up too 2 snacks 09/04/21   Mansouraty, Telford Nab., MD  ?   ? ?Allergies    ?Desipramine   ? ?Review of Systems   ?Review of Systems  ?Gastrointestinal:  Positive for abdominal pain.  ? ?Physical Exam ?Updated Vital Signs ?BP (!) 141/95   Pulse 70   Temp 98.6 ?F (37 ?C) (Oral)   Resp 16   Ht '5\' 10"'$  (1.778 m)   Wt 91.6 kg   SpO2 95%   BMI 28.98 kg/m?  ?Physical Exam ?Vitals and nursing note reviewed.  ?Constitutional:   ?   Appearance: He is well-developed.  ?HENT:  ?   Head: Normocephalic and atraumatic.  ?Eyes:  ?   Pupils: Pupils are equal, round, and reactive to light.  ?Neck:  ?   Vascular: No JVD.  ?Cardiovascular:  ?   Rate and Rhythm:  Normal rate and regular rhythm.  ?   Heart sounds: No murmur heard. ?  No friction rub. No gallop.  ?Pulmonary:  ?   Effort: No respiratory distress.  ?   Breath sounds: No wheezing.  ?Abdominal:  ?   General: There is no distension.  ?   Tenderness: There is abdominal tenderness (diffuse pain on exam without focality). There is no guarding or rebound.  ?Musculoskeletal:     ?   General: Normal range of motion.  ?   Cervical back: Normal range of motion and neck supple.  ?Skin: ?   Coloration: Skin is not pale.  ?   Findings: No rash.  ?Neurological:  ?   Mental Status: He is alert and oriented to person, place, and time.  ?Psychiatric:     ?   Behavior: Behavior normal.  ? ? ?ED Results / Procedures / Treatments   ?Labs ?(all labs ordered are listed, but only abnormal results are displayed) ?Labs Reviewed  ?CBC WITH DIFFERENTIAL/PLATELET - Abnormal; Notable for the following components:  ?    Result Value  ? RBC 5.87 (*)   ? Hemoglobin 17.7 (*)   ? All other components within normal limits  ?COMPREHENSIVE METABOLIC PANEL - Abnormal; Notable for the following  components:  ? CO2 21 (*)   ? Alkaline Phosphatase 144 (*)   ? All other components within normal limits  ?URINALYSIS, ROUTINE W REFLEX MICROSCOPIC - Abnormal; Notable for the following components:  ? Color, Urine AMBER (*)   ? APPearance HAZY (*)   ? Specific Gravity, Urine 1.033 (*)   ? Bilirubin Urine SMALL (*)   ? Ketones, ur 5 (*)   ? Protein, ur 30 (*)   ? Bacteria, UA FEW (*)   ? All other components within normal limits  ?LIPASE, BLOOD  ? ? ?EKG ?EKG Interpretation ? ?Date/Time:  Thursday September 25 2021 17:15:00 EDT ?Ventricular Rate:  88 ?PR Interval:  142 ?QRS Duration: 92 ?QT Interval:  382 ?QTC Calculation: 462 ?R Axis:   84 ?Text Interpretation: Normal sinus rhythm Normal ECG No significant change since last tracing Confirmed by Deno Etienne [phone]) on 09/25/2021 9:59:04 PM ? ?Radiology ?CT ABDOMEN PELVIS W CONTRAST ? ?Result Date: 09/25/2021 ?CLINICAL  DATA:  Generalized abdominal pain for 1 year, history of pancreatitis EXAM: CT ABDOMEN AND PELVIS WITH CONTRAST TECHNIQUE: Multidetector CT imaging of the abdomen and pelvis was performed using the standard protocol following bolus administration of intravenous contrast. RADIATION DOSE REDUCTION: This exam was performed according to the departmental dose-optimization program which includes automated exposure control, adjustment of the mA and/or kV according to patient size and/or use of iterative reconstruction technique. CONTRAST:  118m OMNIPAQUE IOHEXOL 300 MG/ML  SOLN COMPARISON:  08/22/2021 FINDINGS: Lower chest: Dependent atelectatic changes are noted in the bases bilaterally. Hepatobiliary: Fatty infiltration of the liver is noted. Mild biliary ductal dilatation is seen in both intra and extrahepatic ducts. Gallbladder has been surgically removed. Previously seen CBD stent has been removed in the interval. Pancreas: Thinning of the pancreas is noted in the midportion of the body stable in appearance from the prior exam. No findings to suggest acute pancreatitis are seen. Some scarring is noted along the body of the pancreas related to prior interventions. Spleen: Normal in size without focal abnormality. Adrenals/Urinary Tract: Adrenal glands are within normal limits. Kidneys demonstrate a normal enhancement pattern. No renal calculi or obstructive changes are seen. Normal excretion is noted bilaterally. Ureters are within normal limits. The bladder is decompressed. Stomach/Bowel: The appendix is within normal limits. No obstructive or inflammatory changes of the colon are seen. Fluid is noted within the colon additionally fluid is noted throughout the small bowel although no definitive obstructive change is seen. The stomach is within normal limits. Vascular/Lymphatic: No significant vascular findings are present. No enlarged abdominal or pelvic lymph nodes. Reproductive: Prostate is unremarkable. Other: Fat  containing right inguinal hernia is noted. No free fluid is seen. Musculoskeletal: No acute or significant osseous findings. IMPRESSION: Interval removal of common bile duct stents when compare with the prior CT examination. Mild residual biliary ductal dilatation is seen in the intrahepatic and extrahepatic ductal system. Chronic changes about the pancreas with some scarring and thinning of the mid body of the pancreas these changes are similar to that seen on prior exam and likely related to prior interventions. No residual pseudocyst is seen. No acute pancreatitis is noted. Fluid in the small bowel and colon which may be related to a diarrheal state. Clinical correlation is recommended. No other focal abnormality is noted. Electronically Signed   By: MInez CatalinaM.D.   On: 09/25/2021 21:53   ? ?Procedures ?Procedures  ? ? ?Medications Ordered in ED ?Medications  ?morphine (PF) 4 MG/ML injection 4 mg (  4 mg Intravenous Given 09/25/21 1918)  ?ondansetron [facility]) injection 4 mg (4 mg Intravenous Given 09/25/21 1918)  ?sodium chloride 0.9 % bolus 1,000 mL (0 mLs Intravenous Stopped 09/25/21 2023)  ?iohexol (OMNIPAQUE) 300 MG/ML solution 100 mL (100 mLs Intravenous Contrast Given 09/25/21 2132)  ? ? ?ED Course/ Medical Decision Making/ A&P ?  ?                        ?Medical Decision Making ?Amount and/or Complexity of Data Reviewed ?Radiology: ordered. ? ?Risk ?Prescription drug management. ? ? ?51 yo M with a significant past medical history of chronic pancreatitis with multiple surgical procedures most recently had an ERCP last month and had a stent removal from the common bile duct performed at [facility] this was done a couple months ago per record review.  He follows with a GI doctor locally.  Given them a call according to records and was recommended he come in for imaging and lab work.  His blood work is unremarkable, LFTs and lipase are unremarkable.  His UA looks concentrated but shows no signs of infection.  We  will give a bolus of IV fluids.  Will obtain CT imaging.  Reassess. ? ?CT scan with chronic findings to the pancreas.  Status post removal of the common bile duct stent.  Chronic dilatation of the common bile duct.

## 2021-09-25 NOTE — ED Provider Triage Note (Signed)
Emergency Medicine Provider Triage Evaluation Note ? ?Steve Romero , a 51 y.o. male  was evaluated in triage.  Pt complains of gradual onset, constant, worsening, epigastric/left upper quadrant abdominal pain that began a week ago.  Patient reports history of pancreatitis secondary to gallstones for the past year.  PSHx of cholecystectomy last April.  He also complains of nausea, vomiting, diarrhea.  Denies fevers.  Denies alcohol use or heavy NSAID use.  ? ?Review of Systems  ?Positive: + abd pain, nausea, vomiting, diarrhea ?Negative: - fevers ? ?Physical Exam  ?BP (!) 154/113 (BP Location: Right Arm)   Pulse 81   Temp 98.6 ?F (37 ?C) (Oral)   Resp 16   Ht '5\' 10"'$  (1.778 m)   Wt 91.6 kg   SpO2 99%   BMI 28.98 kg/m?  ?Gen:   Awake, no distress   ?Resp:  Normal effort  ?MSK:   Moves extremities without difficulty  ?Other:  + epigastric and LUQ TTP ? ?Medical Decision Making  ?Medically screening exam initiated at 5:38 PM.  Appropriate orders placed.  Abeer Weichel was informed that the remainder of the evaluation will be completed by another provider, this initial triage assessment does not replace that evaluation, and the importance of remaining in the ED until their evaluation is complete. ? ? ?  ?Eustaquio Maize, PA-C ?09/25/21 1739 ? ?

## 2021-09-25 NOTE — Telephone Encounter (Signed)
The pt sister has been advised that the pt should go to the ED for evaluation.  The pt has been advised of the information and verbalized understanding.    ?

## 2021-09-25 NOTE — Telephone Encounter (Signed)
Inbound call from patient sister. States patient have been experiencing severe abd pain the last couple days. States it "feels like somebody ringing out  a rag" in stomach. Patient have also been experiencing diarrhea in bed the last 2 mornings and diarrhea every time goes to bathroom. ?

## 2021-09-26 ENCOUNTER — Telehealth: Payer: Self-pay | Admitting: Gastroenterology

## 2021-09-26 MED ORDER — CIPROFLOXACIN HCL 500 MG PO TABS: 500.0000 mg | ORAL_TABLET | Freq: Two times a day (BID) | ORAL | 0 refills | Status: DC

## 2021-09-26 MED ORDER — METRONIDAZOLE 500 MG PO TABS: 500.0000 mg | ORAL_TABLET | Freq: Two times a day (BID) | ORAL | 0 refills | Status: DC

## 2021-09-26 NOTE — Telephone Encounter (Signed)
Patient called in extreme distress.  He was crying stating he was in extreme pain, has had diarrhea x 1 1/2 weeks, and his urine is very dark no matter how much water he drinks.  He went to ER last night.  Offered 4/3 appt with PA, but he stresses he needs to be seen sooner than that.  He then stated he was afraid for his safety and felt like someone was after him.  I told him to call 911 and he said if he did he would have to move.  By this time he was crying very hard and very agitated.  He asked that you call his sister, Artis Delay, [phone], regarding if he could be seen sooner.  Thank you. ?

## 2021-09-26 NOTE — Telephone Encounter (Signed)
Dr Rush Landmark the pt is calling to report that he went to the ED yesterday per recommendations from this office (see previous note) he was told that "nothing was found" and that he was dehydrated.  He states that he is in severe pain and is tears.  He complains of dark urine and nocturnal fecal incontinence.  He states that he "fears for his safety and thinks that someone is out to get him". He says he can not reach out to authorities in fear he will have to move out of his home.    He was crying uncontrollably and asked that we call his sister.  I called and spoke with Jacqlyn Larsen (pt sister) and she says she is not sure what is going on with him and prefers that Dr Rush Landmark call the pt directly to discuss. Can you please call [phone] to speak with the pt? ?

## 2021-09-26 NOTE — Telephone Encounter (Signed)
Called patient. ?Still having diarrhea. ?Went over all the results that showed no evidence of active pancreatitis. ?Sounds like he may be dealing with a enteritis/colitis. ?Going to start him on Cipro 500 twice daily x10 days and Flagyl 500 twice daily x10 days. ?On Monday, Patty, if he can have stool tests performed either here at [facility] or at his PCP office then I would like him to undergo a GI pathogen panel, C. difficile, ova and parasite, fecal calprotectin, fecal lactoferrin.   ?Patient understands that he can use Imodium up to 12 mg daily for the next few days hopefully will not need that to slow things down. ?Patient was aware that if things worsen or progress he needs to come to the emergency room over the weekend. ? ? ?Justice Britain, MD ?[facility] ?Advanced Endoscopy ?Office # [phone] ?

## 2021-09-29 ENCOUNTER — Other Ambulatory Visit: Payer: Self-pay

## 2021-09-29 ENCOUNTER — Other Ambulatory Visit: Payer: Medicaid Other

## 2021-09-29 DIAGNOSIS — R197 Diarrhea, unspecified: Secondary | ICD-10-CM

## 2021-09-29 DIAGNOSIS — R109 Unspecified abdominal pain: Secondary | ICD-10-CM

## 2021-09-29 NOTE — Addendum Note (Signed)
Addended by: Memory Argue on: 09/29/2021 01:04 PM ? ? Modules accepted: Orders ? ?

## 2021-09-29 NOTE — Telephone Encounter (Signed)
Spoke with patient's sister & let her know that stool test orders are in and patient will need to pick those up today or at earliest convenience. She stated she would relay message to patient. ?

## 2021-09-30 ENCOUNTER — Other Ambulatory Visit: Payer: Medicaid Other

## 2021-10-01 LAB — CALPROTECTIN, FECAL: Calprotectin, Fecal: <16 ug/g (ref 0–120)

## 2021-10-01 LAB — CLOSTRIDIUM DIFFICILE BY PCR: Toxigenic C. Difficile by PCR: NEGATIVE

## 2021-10-03 LAB — OVA AND PARASITE EXAMINATION

## 2021-10-08 LAB — FECAL LACTOFERRIN, QUANT: Lactoferrin, Fecal, Quant.: <1 ug/mL(g) (ref 0.00–7.24)

## 2021-12-17 ENCOUNTER — Telehealth: Payer: Self-pay | Admitting: Gastroenterology

## 2021-12-17 NOTE — Telephone Encounter (Signed)
The pt sister has been advised that nothing is needed at this point. The pt has been advised of the information and verbalized understanding.

## 2021-12-17 NOTE — Telephone Encounter (Signed)
Patient called and scheduled his PV and recall colon.  He was asking if there was any bloodwork or imaging he would need to do prior to that or if anything had been set up for him.  He asked if you would please call his sister, Jacqlyn Larsen, and let her know.  Thank you.

## 2021-12-25 ENCOUNTER — Encounter: Payer: Self-pay | Admitting: Physician Assistant

## 2021-12-25 ENCOUNTER — Ambulatory Visit (INDEPENDENT_AMBULATORY_CARE_PROVIDER_SITE_OTHER): Payer: Medicaid Other | Admitting: Physician Assistant

## 2021-12-25 VITALS — BP 134/87 | HR 88 | Temp 97.6°F | Ht 70.0 in | Wt 211.4 lb

## 2021-12-25 DIAGNOSIS — S61401A Unspecified open wound of right hand, initial encounter: Secondary | ICD-10-CM

## 2021-12-25 NOTE — Patient Instructions (Signed)

## 2021-12-25 NOTE — Progress Notes (Signed)
  Subjective:     Patient ID: Steve Romero, male   DOB: [birth year], 51 y.o.   MRN: [id number]  Laceration   Pt cut the back of the R hand while closing widow last pm Area cleansed at home No sure of last tetanus    Review of Systems +pain to the wounds Denies numbness to the hand    Objective:   Physical Exam Vitals and nursing note reviewed.  Constitutional:      General: He is not in acute distress.    Appearance: Normal appearance. He is not toxic-appearing.  Neurological:     Mental Status: He is alert.   Oblique laceration over the 3rd and 4th knuckles of the R hand Sl gaping of the laceration to the #rd knuckle FROM of th e finger No surrounding erythema, edema, or induration Sl clear drainage Area cleansed and steri strips placed Dressing applied Tetanus confirmed up to date     Assessment:     1. Open wound of right hand without foreign body, unspecified wound type, initial encounter        Plan:     Wound care reviewed Pt to return immed if any S./S of infection Nl course reviewed F/U prn PATP

## 2022-01-16 ENCOUNTER — Ambulatory Visit (AMBULATORY_SURGERY_CENTER): Payer: Self-pay | Admitting: *Deleted

## 2022-01-16 VITALS — Ht 70.0 in | Wt 216.0 lb

## 2022-01-16 DIAGNOSIS — Z86010 Personal history of colonic polyps: Secondary | ICD-10-CM

## 2022-01-16 MED ORDER — PEG 3350-KCL-NA BICARB-NACL 420 G PO SOLR: 4000.0000 mL | Freq: Once | ORAL | 0 refills | Status: AC

## 2022-01-16 NOTE — Progress Notes (Signed)
Patient is here in-person for PV. Patient denies any allergies to eggs or soy. Patient denies any problems with anesthesia/sedation. Patient is not on any oxygen at home. Patient is not taking any diet/weight loss medications or blood thinners. Went over procedure prep instructions with the patient. Patient is aware of our care-partner policy.

## 2022-01-19 ENCOUNTER — Telehealth: Payer: Self-pay | Admitting: Family Medicine

## 2022-01-19 NOTE — Telephone Encounter (Signed)
Pt still having issues with ear infection/busted ear drum- pt states its swelling and feels like something is walking in his ear.. I scheduled for next available with dod

## 2022-01-20 ENCOUNTER — Encounter: Payer: Self-pay | Admitting: Family

## 2022-01-20 ENCOUNTER — Ambulatory Visit: Payer: Medicaid Other | Admitting: Family

## 2022-01-20 VITALS — BP 128/78 | HR 74 | Temp 98.0°F | Ht 70.0 in | Wt 214.2 lb

## 2022-01-20 DIAGNOSIS — H65192 Other acute nonsuppurative otitis media, left ear: Secondary | ICD-10-CM | POA: Diagnosis not present

## 2022-01-20 MED ORDER — LEVOFLOXACIN 500 MG PO TABS: 500.0000 mg | ORAL_TABLET | Freq: Every day | ORAL | 0 refills | Status: DC

## 2022-01-20 NOTE — Progress Notes (Signed)
Subjective:    Patient ID: Steve Romero, male    DOB: Mar 20, 1971, 51 y.o.   MRN: [id number]  Chief Complaint  Patient presents with   Ear Pain    LEFT   PT presents to the office today with left ear pain. He went to the Urgent Care on Saturday and was diagnosed with otitis media and given amoxicillin 500 mg BID.  Otalgia  There is pain in the left ear. This is a new problem. The current episode started in the past 7 days. The problem occurs constantly. The problem has been gradually worsening. There has been no fever. The pain is at a severity of 6/10. The pain is moderate. Associated symptoms include hearing loss. Pertinent negatives include no coughing, diarrhea, headaches, rhinorrhea or vomiting. He has tried antibiotics and NSAIDs for the symptoms. The treatment provided mild relief.      Review of Systems  HENT:  Positive for ear pain and hearing loss. Negative for rhinorrhea.   Respiratory:  Negative for cough.   Gastrointestinal:  Negative for diarrhea and vomiting.  Neurological:  Negative for headaches.  All other systems reviewed and are negative.      Objective:   Physical Exam Vitals reviewed.  Constitutional:      General: He is not in acute distress.    Appearance: He is well-developed.  HENT:     Head: Normocephalic.     Right Ear: Tympanic membrane normal.     Left Ear: Decreased hearing noted. Drainage, swelling and tenderness present.     Ears:     Comments: Unable to visualize TM because of discharge and swelling Eyes:     General:        Right eye: No discharge.        Left eye: No discharge.     Pupils: Pupils are equal, round, and reactive to light.  Neck:     Thyroid: No thyromegaly.  Cardiovascular:     Rate and Rhythm: Normal rate and regular rhythm.     Heart sounds: Normal heart sounds. No murmur heard. Pulmonary:     Effort: Pulmonary effort is normal. No respiratory distress.     Breath sounds: Normal breath sounds. No wheezing.   Abdominal:     General: Bowel sounds are normal. There is no distension.     Palpations: Abdomen is soft.     Tenderness: There is no abdominal tenderness.  Musculoskeletal:        General: No tenderness. Normal range of motion.     Cervical back: Normal range of motion and neck supple.  Skin:    General: Skin is warm and dry.     Findings: No erythema or rash.  Neurological:     Mental Status: He is alert and oriented to person, place, and time.     Cranial Nerves: No cranial nerve deficit.     Deep Tendon Reflexes: Reflexes are normal and symmetric.  Psychiatric:        Behavior: Behavior normal.        Thought Content: Thought content normal.        Judgment: Judgment normal.     BP 128/78   Pulse 74   Temp 98 F (36.7 C)   Ht '5\' 10"'$  (1.778 m)   Wt 214 lb 3.2 oz (97.2 kg)   SpO2 96%   BMI 30.73 kg/m      Assessment & Plan:  [name] comes in today with chief complaint of Ear  Pain (LEFT)   Diagnosis and orders addressed:  1. Other non-recurrent acute nonsuppurative otitis media of left ear Stop Amoxicillin and start Levaquin  Avoid getting water into ear Alter tylenol and motrin May need referral to ENT - levofloxacin (LEVAQUIN) 500 MG tablet; Take 1 tablet (500 mg total) by mouth daily.  Dispense: 7 tablet; Refill: 0   Evelina Dun, FNP

## 2022-01-20 NOTE — Patient Instructions (Signed)
Otitis Media, Adult  Otitis media occurs when there is inflammation and fluid in the middle ear with signs and symptoms of an acute infection. The middle ear is a part of the ear that contains bones for hearing as well as air that helps send sounds to the brain. When infected fluid builds up in this space, it causes pressure and can lead to an ear infection. The eustachian tube connects the middle ear to the back of the nose (nasopharynx) and normally allows air into the middle ear. If the eustachian tube becomes blocked, fluid can build up and become infected. What are the causes? This condition is caused by a blockage in the eustachian tube. This can be caused by mucus or by swelling of the tube. Problems that can cause a blockage include: A cold or other upper respiratory infection. Allergies. An irritant, such as tobacco smoke. Enlarged adenoids. The adenoids are areas of soft tissue located high in the back of the throat, behind the nose and the roof of the mouth. They are part of the body's defense system (immune system). A mass in the nasopharynx. Damage to the ear caused by pressure changes (barotrauma). What increases the risk? You are more likely to develop this condition if you: Smoke or are exposed to tobacco smoke. Have an opening in the roof of your mouth (cleft palate). Have gastroesophageal reflux. Have an immune system disorder. What are the signs or symptoms? Symptoms of this condition include: Ear pain. Fever. Decreased hearing. Tiredness (lethargy). Fluid leaking from the ear, if the eardrum is ruptured or has burst. Ringing in the ear. How is this diagnosed?  This condition is diagnosed with a physical exam. During the exam, your health care provider will use an instrument called an otoscope to look in your ear and check for redness, swelling, and fluid. He or she will also ask about your symptoms. Your health care provider may also order tests, such as: A pneumatic  otoscopy. This is a test to check the movement of the eardrum. It is done by squeezing a small amount of air into the ear. A tympanogram. This is a test that shows how well the eardrum moves in response to air pressure in the ear canal. It provides a graph for your health care provider to review. How is this treated? This condition can go away on its own within 3-5 days. But if the condition is caused by a bacterial infection and does not go away on its own, or if it keeps coming back, your health care provider may: Prescribe antibiotic medicine to treat the infection. Prescribe or recommend medicines to control pain. Follow these instructions at home: Take over-the-counter and prescription medicines only as told by your health care provider. If you were prescribed an antibiotic medicine, take it as told by your health care provider. Do not stop taking the antibiotic even if you start to feel better. Keep all follow-up visits. This is important. Contact a health care provider if: You have bleeding from your nose. There is a lump on your neck. You are not feeling better in 5 days. You feel worse instead of better. Get help right away if: You have severe pain that is not controlled with medicine. You have swelling, redness, or pain around your ear. You have stiffness in your neck. A part of your face is not moving (paralyzed). The bone behind your ear (mastoid bone) is tender when you touch it. You develop a severe headache. Summary Otitis media   is redness, soreness, and swelling of the middle ear, usually resulting in pain and decreased hearing. This condition can go away on its own within 3-5 days. If the problem does not go away in 3-5 days, your health care provider may give you medicines to treat the infection. If you were prescribed an antibiotic medicine, take it as told by your health care provider. Follow all instructions that were given to you by your health care provider. This  information is not intended to replace advice given to you by your health care provider. Make sure you discuss any questions you have with your health care provider. Document Revised: 09/30/2020 Document Reviewed: 09/30/2020 Elsevier Patient Education  2023 Elsevier Inc.  

## 2022-01-27 ENCOUNTER — Ambulatory Visit: Payer: Medicaid Other | Admitting: Family Medicine

## 2022-01-27 ENCOUNTER — Encounter: Payer: Self-pay | Admitting: Family Medicine

## 2022-01-27 VITALS — BP 130/81 | HR 77 | Temp 97.9°F | Ht 70.0 in | Wt 216.6 lb

## 2022-01-27 DIAGNOSIS — H65192 Other acute nonsuppurative otitis media, left ear: Secondary | ICD-10-CM | POA: Diagnosis not present

## 2022-01-27 DIAGNOSIS — H938X2 Other specified disorders of left ear: Secondary | ICD-10-CM | POA: Diagnosis not present

## 2022-01-27 DIAGNOSIS — Z8719 Personal history of other diseases of the digestive system: Secondary | ICD-10-CM

## 2022-01-27 MED ORDER — LEVOFLOXACIN 500 MG PO TABS: 500.0000 mg | ORAL_TABLET | Freq: Every day | ORAL | 0 refills | Status: AC

## 2022-01-27 NOTE — Progress Notes (Signed)
Subjective: CC: Chronic follow-up PCP: [name], DO XNA:TFTDDU Steve Romero is a 51 y.o. male presenting to clinic today for:  1.  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  Overall he seems to be doing well.  He had a follow-up with gastroenterology back in March and is expected to see them again soon for repeat evaluation/imaging and internal examination.  Overall he feels like he is eating his normal and is really not had any exacerbations of abdominal pain in several months.  He questions as to whether or not I will be taking over his Creon.  He is compliant with this medication.  2.  Recent otitis media Patient had an acute otitis media/externa noted on 01/17/2022 urgent care exam and then was seen again here in office on 01/20/2022 x 1 my partners.  He was transitioned from amoxicillin orally to Levaquin because the TM was totally obscured by purulence in the external auditory canal.  He returns today saying that he is doing better but he still finds it quite hard to hear out of that left ear.  He just finished the Levaquin dosing yesterday.  No reports of dizziness, fevers or significant drainage from the ear at this time   ROS: Per HPI  Allergies  Allergen Reactions   Desipramine Nausea Only and Rash   Past Medical History:  Diagnosis Date   Acute pancreatitis without necrosis or infection, unspecified    Anxiety 10/2014   Ascites    Bipolar disorder [facility]) age 26   COPD (chronic obstructive pulmonary disease) ([place]) 2016   Depression age 30   Enlarged prostate    Hyperlipidemia 2016   Hypertension 2013   Schizoaffective disorder ([place]) 11/13/2014   Sleep apnea    Does not wear c-pap, sleeps in sitting up position per pt   Thyroid disease 11/2014    Current Outpatient Medications:    levofloxacin (LEVAQUIN) 500 MG tablet, Take 1 tablet (500 mg total) by mouth daily., Disp: 7 tablet, Rfl: 0    lipase/protease/amylase (CREON) 36000 UNITS CPEP capsule, 3 capsules with each meal and 1-2 capsules with each snack (up too 2 snacks, Disp: 540 capsule, Rfl: 11 Social History   Socioeconomic History   Marital status: Single    Spouse name: Not on file   Number of children: Not on file   Years of education: Not on file   Highest education level: Not on file  Occupational History   Not on file  Tobacco Use   Smoking status: Every Day    Packs/day: 0.25    Years: 26.00    Total pack years: 6.50    Types: Cigarettes   Smokeless tobacco: Never   Tobacco comments:    4-5 cig daily as of 10/07/2020.  Vaping Use   Vaping Use: Never used  Substance and Sexual Activity   Alcohol use: No   Drug use: No   Sexual activity: Never    Birth control/protection: None  Other Topics Concern   Not on file  Social History Narrative   Not on file   Social Determinants of Health   Financial Resource Strain: Not on file  Food Insecurity: Not on file  Transportation Needs: Not on file  Physical Activity: Not on file  Stress: Not on file  Social Connections: Not on file  Intimate Partner Violence: Not on file   Family History  Problem Relation Age of Onset   Diabetes Father  Heart disease Father    Heart disease Maternal Grandmother    Stroke Maternal Grandfather    Colon cancer Neg Hx    Esophageal cancer Neg Hx    Inflammatory bowel disease Neg Hx    Liver disease Neg Hx    Pancreatic cancer Neg Hx    Rectal cancer Neg Hx    Stomach cancer Neg Hx     Objective: Office vital signs reviewed. BP 130/81   Pulse 77   Temp 97.9 F (36.6 C)   Ht '5\' 10"'$  (1.778 m)   Wt 216 lb 9.6 oz (98.2 kg)   SpO2 97%   BMI 31.08 kg/m   Physical Examination:  General: Awake, alert, nontoxic male, No acute distress HEENT: Normal    Neck: No masses palpated. No lymphadenopathy    Ears: Tympanic membrane intact on the right.  Left TM mostly obscured by what appears to be a vascular mass at the  7 o'clock position of the external auditory canal.  Difficult to tell if this is cystic    Eyes: PERRLA, extraocular membranes intact, sclera white Cardio: regular rate and rhythm, S1S2 heard, no murmurs appreciated Pulm: normal WOB on room air  Assessment/ Plan: 51 y.o. male   Ear mass, left - Plan: Ambulatory referral to ENT  Other non-recurrent acute nonsuppurative otitis media of left ear - Plan: levofloxacin (LEVAQUIN) 500 MG tablet  History of acute pancreatitis  Uncertain etiology of lesion in the external auditory canal.  It seemed to be vascular as there was a bluish hue associated intermittently.  I did not attempt to scrape away the clump of tissue because I could not rule out that this had a vascular component to it.  Urgent referral placed to ENT to further evaluate.  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  Utilizing Creon without difficulty.  Has follow-up with his GI provider in about a week.  I discussed with him I would be glad to continue this medication as long as parameters/monitoring recommendations were identified for me by his GI provider.  No orders of the defined types were placed in this encounter.  No orders of the defined types were placed in this encounter.    [name], DO [place] [phone]

## 2022-02-01 ENCOUNTER — Encounter: Payer: Self-pay | Admitting: Certified Registered Nurse Anesthetist

## 2022-02-06 ENCOUNTER — Encounter: Payer: Self-pay | Admitting: Gastroenterology

## 2022-02-06 ENCOUNTER — Ambulatory Visit (AMBULATORY_SURGERY_CENTER): Payer: Medicaid Other | Admitting: Gastroenterology

## 2022-02-06 VITALS — BP 124/86 | HR 56 | Temp 97.3°F | Resp 21 | Ht 70.0 in | Wt 216.0 lb

## 2022-02-06 DIAGNOSIS — Z09 Encounter for follow-up examination after completed treatment for conditions other than malignant neoplasm: Secondary | ICD-10-CM | POA: Diagnosis not present

## 2022-02-06 DIAGNOSIS — D123 Benign neoplasm of transverse colon: Secondary | ICD-10-CM

## 2022-02-06 DIAGNOSIS — D127 Benign neoplasm of rectosigmoid junction: Secondary | ICD-10-CM

## 2022-02-06 DIAGNOSIS — Z86010 Personal history of colonic polyps: Secondary | ICD-10-CM

## 2022-02-06 DIAGNOSIS — D128 Benign neoplasm of rectum: Secondary | ICD-10-CM

## 2022-02-06 MED ORDER — SODIUM CHLORIDE 0.9 % IV SOLN: 500.0000 mL | INTRAVENOUS | Status: DC

## 2022-02-06 NOTE — Progress Notes (Signed)
Called to room to assist during endoscopic procedure.  Patient ID and intended procedure confirmed with present staff. Received instructions for my participation in the procedure from the performing physician.  

## 2022-02-06 NOTE — Patient Instructions (Addendum)
Follow a high fiber diet. Use Fibercon tablets 1-2 by mouth daily. Awaiting pathology results. Repeat Colonoscopy in 3 years for surveillance. Follow up in the clinic in 4-8 weeks with Dr. Rush Landmark.   YOU HAD AN ENDOSCOPIC PROCEDURE TODAY AT [facility]:   Refer to the procedure report that was given to you for any specific questions about what was found during the examination.  If the procedure report does not answer your questions, please call your gastroenterologist to clarify.  If you requested that your care partner not be given the details of your procedure findings, then the procedure report has been included in a sealed envelope for you to review at your convenience later.  YOU SHOULD EXPECT: Some feelings of bloating in the abdomen. Passage of more gas than usual.  Walking can help get rid of the air that was put into your GI tract during the procedure and reduce the bloating. If you had a lower endoscopy (such as a colonoscopy or flexible sigmoidoscopy) you may notice spotting of blood in your stool or on the toilet paper. If you underwent a bowel prep for your procedure, you may not have a normal bowel movement for a few days.  Please Note:  You might notice some irritation and congestion in your nose or some drainage.  This is from the oxygen used during your procedure.  There is no need for concern and it should clear up in a day or so.  SYMPTOMS TO REPORT IMMEDIATELY:  Following lower endoscopy (colonoscopy or flexible sigmoidoscopy):  Excessive amounts of blood in the stool  Significant tenderness or worsening of abdominal pains  Swelling of the abdomen that is new, acute  Fever of 100F or higher  For urgent or emergent issues, a gastroenterologist can be reached at any hour by calling [phone]. Do not use MyChart messaging for urgent concerns.    DIET:  We do recommend a small meal at first, but then you may proceed to your regular diet.  Drink plenty of  fluids but you should avoid alcoholic beverages for 24 hours.  ACTIVITY:  You should plan to take it easy for the rest of today and you should NOT DRIVE or use heavy machinery until tomorrow (because of the sedation medicines used during the test).    FOLLOW UP: Our staff will call the number listed on your records the next business day following your procedure.  We will call around 7:15- 8:00 am to check on you and address any questions or concerns that you may have regarding the information given to you following your procedure. If we do not reach you, we will leave a message.  If you develop any symptoms (ie: fever, flu-like symptoms, shortness of breath, cough etc.) before then, please call [phone].  If you test positive for Covid 19 in the 2 weeks post procedure, please call and report this information to [place].    If any biopsies were taken you will be contacted by phone or by letter within the next 1-3 weeks.  Please call us at [phone] if you have not heard about the biopsies in 3 weeks.    SIGNATURES/CONFIDENTIALITY: You and/or your care partner have signed paperwork which will be entered into your electronic medical record.  These signatures attest to the fact that that the information above on your After Visit Summary has been reviewed and is understood.  Full responsibility of the confidentiality of this discharge information lies with you and/or your  care-partner.  

## 2022-02-06 NOTE — Op Note (Signed)
[place] Patient Name: Steve Romero Procedure Date: 02/06/2022 2:13 PM MRN: [id number] Endoscopist: Justice Britain , MD Age: 51 Referring MD:  Date of Birth: [birth year] Gender: Male Account #: [id number] Procedure:                Colonoscopy Indications:              Surveillance: Personal history of adenomatous                            polyps, inadequate prep on last colonoscopy (less                            than 1 year ago), High risk colon cancer                            surveillance: Personal history of adenoma (10 mm or                            greater in size) Medicines:                Monitored Anesthesia Care Procedure:                Pre-Anesthesia Assessment:                           - Prior to the procedure, a History and Physical                            was performed, and patient medications and                            allergies were reviewed. The patient's tolerance of                            previous anesthesia was also reviewed. The risks                            and benefits of the procedure and the sedation                            options and risks were discussed with the patient.                            All questions were answered, and informed consent                            was obtained. Prior Anticoagulants: The patient has                            taken no previous anticoagulant or antiplatelet                            agents. ASA Grade Assessment: III - A patient with  severe systemic disease. After reviewing the risks                            and benefits, the patient was deemed in                            satisfactory condition to undergo the procedure.                           After obtaining informed consent, the colonoscope                            was passed under direct vision. Throughout the                            procedure, the patient's blood pressure, pulse, and                             oxygen saturations were monitored continuously. The                            CF [id number] #[id number] was introduced through the anus                            and advanced to the the cecum, identified by                            transillumination. The colonoscopy was performed                            without difficulty. The patient tolerated the                            procedure. The quality of the bowel preparation was                            adequate. The terminal ileum, ileocecal valve,                            appendiceal orifice, and rectum were photographed. Scope In: 2:17:52 PM Scope Out: 2:38:59 PM Scope Withdrawal Time: 0 hours 17 minutes 0 seconds  Total Procedure Duration: 0 hours 21 minutes 7 seconds  Findings:                 The digital rectal exam findings include                            hemorrhoids. Pertinent negatives include no                            palpable rectal lesions.                           The terminal ileum and ileocecal valve appeared  normal.                           Four sessile polyps were found in the rectum (1),                            recto-sigmoid colon (1) and transverse colon (2).                            The polyps were 3 to 6 mm in size. These polyps                            were removed with a cold snare. Resection and                            retrieval were complete.                           Normal mucosa was found in the entire colon                            otherwise.                           Non-bleeding non-thrombosed external and internal                            hemorrhoids were found during retroflexion, during                            perianal exam and during digital exam. The                            hemorrhoids were Grade II (internal hemorrhoids                            that prolapse but reduce spontaneously). Complications:            No  immediate complications. Estimated Blood Loss:     Estimated blood loss was minimal. Impression:               - Hemorrhoids found on digital rectal exam.                           - The examined portion of the ileum was normal.                           - Four 3 to 6 mm polyps in the rectum, at the                            recto-sigmoid colon and in the transverse colon,                            removed with a cold snare. Resected and retrieved.                           -  Normal mucosa in the entire examined colon                            otherwise.                           - Non-bleeding non-thrombosed external and internal                            hemorrhoids. Recommendation:           - The patient will be observed post-procedure,                            until all discharge criteria are met.                           - Discharge patient to home.                           - Patient has a contact number available for                            emergencies. The signs and symptoms of potential                            delayed complications were discussed with the                            patient. Return to normal activities tomorrow.                            Written discharge instructions were provided to the                            patient.                           - High fiber diet.                           - Use FiberCon 1-2 tablets PO daily.                           - Continue present medications.                           - Await pathology results.                           - Repeat colonoscopy in 3 years for surveillance                            based on pathology results (based on prior history                            of advanced adenoma removed last year that was  resected (no matter pathology on today's exam)).                           - The findings and recommendations were discussed                            with the  patient.                           - The findings and recommendations were discussed                            with the patient's family.                           - Follow up in clinic to see how patient is doing                            from his issues of pancreatitis in near future. Justice Britain, MD 02/06/2022 2:46:39 PM

## 2022-02-06 NOTE — Progress Notes (Signed)
Report given to PACU, vss 

## 2022-02-06 NOTE — Progress Notes (Signed)
GASTROENTEROLOGY PROCEDURE H&P NOTE   Primary Care Physician: [name], DO  HPI: Steve Romero is a 51 y.o. male who presents for with a pmh significant for colonoscopy for surveillance in setting of previous advanced adenoma and fair preparation.   Past Medical History:  Diagnosis Date   Acute pancreatitis without necrosis or infection, unspecified    Anxiety 10/2014   Ascites    Bipolar disorder [facility]) age 10   COPD (chronic obstructive pulmonary disease) ([place]) 2016   Depression age 76   Enlarged prostate    Hyperlipidemia 2016   Hypertension 2013   Schizoaffective disorder ([place]) 11/13/2014   Sleep apnea    Does not wear c-pap, sleeps in sitting up position per pt   Thyroid disease 11/2014   Past Surgical History:  Procedure Laterality Date   BALLOON DILATION N/A 11/07/2020   Procedure: BALLOON DILATION;  Surgeon: Irving Copas., MD;  Location: [facility] ENDOSCOPY;  Service: Gastroenterology;  Laterality: N/A;   BALLOON DILATION N/A 06/26/2021   Procedure: BALLOON DILATION;  Surgeon: Rush Landmark Telford Nab., MD;  Location: [place];  Service: Gastroenterology;  Laterality: N/A;   BILIARY DILATION  07/24/2021   Procedure: BILIARY DILATION;  Surgeon: Rush Landmark Telford Nab., MD;  Location: [place];  Service: Gastroenterology;;   BILIARY STENT PLACEMENT N/A 10/10/2020   Procedure: BILIARY STENT PLACEMENT;  Surgeon: Rogene Houston, MD;  Location: [facility];  Service: Endoscopy;  Laterality: N/A;   BILIARY STENT PLACEMENT  11/09/2020   Procedure: BILIARY STENT PLACEMENT;  Surgeon: Rush Landmark Telford Nab., MD;  Location: [place];  Service: Gastroenterology;;   BILIARY STENT PLACEMENT  11/07/2020   Procedure: BILIARY STENT PLACEMENT;  Surgeon: Irving Copas., MD;  Location: [place];  Service: Gastroenterology;;   BILIARY STENT PLACEMENT  11/11/2020   Procedure: BILIARY STENT PLACEMENT;  Surgeon: Irving Copas., MD;  Location: [place];  Service: Gastroenterology;;   BILIARY STENT PLACEMENT  11/14/2020   Procedure: BILIARY STENT PLACEMENT;  Surgeon: Irving Copas., MD;  Location: [place];  Service: Gastroenterology;;   BILIARY STENT PLACEMENT N/A 01/29/2021   Procedure: BILIARY STENT PLACEMENT;  Surgeon: Irving Copas., MD;  Location: [facility];  Service: Gastroenterology;  Laterality: N/A;   BILIARY STENT PLACEMENT  06/26/2021   Procedure: BILIARY STENT PLACEMENT;  Surgeon: Rush Landmark Telford Nab., MD;  Location: [place];  Service: Gastroenterology;;   BILIARY STENT PLACEMENT  07/24/2021   Procedure: BILIARY STENT PLACEMENT;  Surgeon: Irving Copas., MD;  Location: [place];  Service: Gastroenterology;;   BIOPSY  11/07/2020   Procedure: BIOPSY;  Surgeon: Irving Copas., MD;  Location: [place];  Service: Gastroenterology;;   BIOPSY  12/20/2020   Procedure: BIOPSY;  Surgeon: Irving Copas., MD;  Location: [facility];  Service: Gastroenterology;;   BIOPSY  06/26/2021   Procedure: BIOPSY;  Surgeon: Irving Copas., MD;  Location: [place];  Service: Gastroenterology;;   CARPAL TUNNEL RELEASE Left 02/21/2016   Procedure: CARPAL TUNNEL RELEASE;  Surgeon: Carole Civil, MD;  Location: [facility];  Service: Orthopedics;  Laterality: Left;   CHOLECYSTECTOMY N/A 10/09/2020   Procedure: LAPAROSCOPIC CHOLECYSTECTOMY;  Surgeon: Virl Cagey, MD;  Location: [facility];  Service: General;  Laterality: N/A;   CYST ENTEROSTOMY N/A 11/07/2020   Procedure: CYST ENTEROSTOMY;  Surgeon: Irving Copas., MD;  Location: [place];  Service: Gastroenterology;  Laterality: N/A;   CYST GASTROSTOMY  11/11/2020   Procedure: CYST NECROSECTOMY;  Surgeon: Rush Landmark Telford Nab., MD;  Location: [facility];  Service: Gastroenterology;;   CYST GASTROSTOMY  11/14/2020   Procedure: CYST NECROSECTOMY;  Surgeon: Rush Landmark Telford Nab., MD;  Location: [place];  Service: Gastroenterology;;   CYST REMOVAL HAND     CYSTOSCOPY  06/26/2021   Procedure: CYSTOGASTROSTOMY;  Surgeon: Mansouraty, Telford Nab., MD;  Location: [facility] ENDOSCOPY;  Service: Gastroenterology;;   ENDOSCOPIC RETROGRADE CHOLANGIOPANCREATOGRAPHY (ERCP) WITH PROPOFOL N/A 11/09/2020   Procedure: ENDOSCOPIC RETROGRADE CHOLANGIOPANCREATOGRAPHY (ERCP) WITH PROPOFOL;  Surgeon: Irving Copas., MD;  Location: [place];  Service: Gastroenterology;  Laterality: N/A;   ENDOSCOPIC RETROGRADE CHOLANGIOPANCREATOGRAPHY (ERCP) WITH PROPOFOL N/A 12/20/2020   Procedure: ENDOSCOPIC RETROGRADE CHOLANGIOPANCREATOGRAPHY (ERCP) WITH PROPOFOL;  Surgeon: Rush Landmark Telford Nab., MD;  Location: [facility];  Service: Gastroenterology;  Laterality: N/A;   ENDOSCOPIC RETROGRADE CHOLANGIOPANCREATOGRAPHY (ERCP) WITH PROPOFOL N/A 01/29/2021   Procedure: ENDOSCOPIC RETROGRADE CHOLANGIOPANCREATOGRAPHY (ERCP) WITH PROPOFOL;  Surgeon: Rush Landmark Telford Nab., MD;  Location: [facility];  Service: Gastroenterology;  Laterality: N/A;   ENDOSCOPIC RETROGRADE CHOLANGIOPANCREATOGRAPHY (ERCP) WITH PROPOFOL N/A 07/24/2021   Procedure: ENDOSCOPIC RETROGRADE CHOLANGIOPANCREATOGRAPHY (ERCP) WITH PROPOFOL;  Surgeon: Rush Landmark Telford Nab., MD;  Location: [place];  Service: Gastroenterology;  Laterality: N/A;   ERCP N/A 10/10/2020   Procedure: ENDOSCOPIC RETROGRADE CHOLANGIOPANCREATOGRAPHY (ERCP);  Surgeon: Rogene Houston, MD;  Location: [facility];  Service: Endoscopy;  Laterality: N/A;   ERCP     ESOPHAGOGASTRODUODENOSCOPY N/A 11/11/2020   Procedure: ESOPHAGOGASTRODUODENOSCOPY (EGD);  Surgeon: Irving Copas., MD;  Location: [place];  Service: Gastroenterology;  Laterality: N/A;   ESOPHAGOGASTRODUODENOSCOPY N/A 11/14/2020   Procedure: ESOPHAGOGASTRODUODENOSCOPY (EGD);  Surgeon: Irving Copas., MD;  Location: [place];  Service: Gastroenterology;  Laterality: N/A;    ESOPHAGOGASTRODUODENOSCOPY (EGD) WITH PROPOFOL N/A 11/09/2020   Procedure: ESOPHAGOGASTRODUODENOSCOPY (EGD) WITH PROPOFOL;  Surgeon: Rush Landmark Telford Nab., MD;  Location: [place];  Service: Gastroenterology;  Laterality: N/A;   ESOPHAGOGASTRODUODENOSCOPY (EGD) WITH PROPOFOL N/A 11/07/2020   Procedure: ESOPHAGOGASTRODUODENOSCOPY (EGD) WITH PROPOFOL;  Surgeon: Rush Landmark Telford Nab., MD;  Location: [place];  Service: Gastroenterology;  Laterality: N/A;   ESOPHAGOGASTRODUODENOSCOPY (EGD) WITH PROPOFOL N/A 12/20/2020   Procedure: ESOPHAGOGASTRODUODENOSCOPY (EGD) WITH PROPOFOL;  Surgeon: Rush Landmark Telford Nab., MD;  Location: [facility];  Service: Gastroenterology;  Laterality: N/A;   ESOPHAGOGASTRODUODENOSCOPY (EGD) WITH PROPOFOL N/A 06/26/2021   Procedure: ESOPHAGOGASTRODUODENOSCOPY (EGD) WITH PROPOFOL;  Surgeon: Rush Landmark Telford Nab., MD;  Location: [place];  Service: Gastroenterology;  Laterality: N/A;   ESOPHAGOGASTRODUODENOSCOPY (EGD) WITH PROPOFOL N/A 07/24/2021   Procedure: ESOPHAGOGASTRODUODENOSCOPY (EGD) WITH PROPOFOL;  Surgeon: Rush Landmark Telford Nab., MD;  Location: [place];  Service: Gastroenterology;  Laterality: N/A;  AXIOS STENT   EUS  11/14/2020   Procedure: UPPER ENDOSCOPIC ULTRASOUND (EUS) LINEAR;  Surgeon: Irving Copas., MD;  Location: [place];  Service: Gastroenterology;;   EUS N/A 06/26/2021   Procedure: UPPER ENDOSCOPIC ULTRASOUND (EUS) RADIAL;  Surgeon: Irving Copas., MD;  Location: [place];  Service: Gastroenterology;  Laterality: N/A;   GASTROINTESTINAL STENT REMOVAL  12/20/2020   Procedure: GASTROINTESTINAL STENT REMOVAL;  Surgeon: Irving Copas., MD;  Location: [facility];  Service: Gastroenterology;;  cyst gastrostomy stent and double pig tail stents x2 removed   HERNIA REPAIR Left 2002   groin   INGUINAL HERNIA REPAIR Left 04/05/2017   Procedure: RECURRENT HERNIA REPAIR INGUINAL ADULT WITH MESH;  Surgeon:  [name], MD;  Location: [facility];  Service: General;  Laterality: Left;   MASS EXCISION Right 04/29/2020   Procedure: EXCISION MASS RIGHT WRIST;  Surgeon: [name], MD;  Location: [facility];  Service: Orthopedics;  Laterality: Right;   MASS EXCISION Right 10/09/2020   Procedure: EXCISION MASS, ABDOMINAL WALL, 2CM;  Surgeon: Virl Cagey, MD;  Location: [facility];  Service: General;  Laterality: Right;   PANCREATIC STENT PLACEMENT  06/26/2021   Procedure: AXIOS STENT PLACEMENT;  Surgeon: Irving Copas., MD;  Location: [place];  Service: Gastroenterology;;   REMOVAL OF STONES  12/20/2020   Procedure: REMOVAL OF STONES;  Surgeon: Irving Copas., MD;  Location: [facility];  Service: Gastroenterology;;   REMOVAL OF STONES  07/24/2021   Procedure: REMOVAL OF STONES;  Surgeon: Irving Copas., MD;  Location: [place];  Service: Gastroenterology;;   Joan Mayans N/A 10/10/2020   Procedure: Joan Mayans;  Surgeon: Rogene Houston, MD;  Location: [facility];  Service: Endoscopy;  Laterality: N/A;   SPHINCTEROTOMY  07/24/2021   Procedure: SPHINCTEROTOMY;  Surgeon: Mansouraty, Telford Nab., MD;  Location: [place];  Service: Gastroenterology;;   Bess Kinds CHOLANGIOSCOPY N/A 12/20/2020   Procedure: RSWNIOEV CHOLANGIOSCOPY;  Surgeon: Irving Copas., MD;  Location: [facility];  Service: Gastroenterology;  Laterality: N/A;   SPYGLASS CHOLANGIOSCOPY N/A 07/24/2021   Procedure: SPYGLASS CHOLANGIOSCOPY;  Surgeon: Irving Copas., MD;  Location: [place];  Service: Gastroenterology;  Laterality: N/A;   STENT REMOVAL  11/09/2020   Procedure: STENT REMOVAL;  Surgeon: Irving Copas., MD;  Location: [place];  Service: Gastroenterology;;   Lavell Islam REMOVAL  11/11/2020   Procedure: STENT REMOVAL;  Surgeon: Irving Copas., MD;  Location: [place];  Service: Gastroenterology;;   Lavell Islam REMOVAL  11/14/2020    Procedure: STENT REMOVAL;  Surgeon: Irving Copas., MD;  Location: [place];  Service: Gastroenterology;;   Lavell Islam REMOVAL  12/20/2020   Procedure: STENT REMOVAL;  Surgeon: Irving Copas., MD;  Location: [facility];  Service: Gastroenterology;;  biliary x2   STENT REMOVAL  07/24/2021   Procedure: AXIOS STENT REMOVAL;  Surgeon: Irving Copas., MD;  Location: [place];  Service: Gastroenterology;;   UPPER ESOPHAGEAL ENDOSCOPIC ULTRASOUND (EUS) N/A 11/07/2020   Procedure: UPPER ESOPHAGEAL ENDOSCOPIC ULTRASOUND (EUS);  Surgeon: Irving Copas., MD;  Location: [place];  Service: Gastroenterology;  Laterality: N/A;   UPPER GASTROINTESTINAL ENDOSCOPY     WOUND DEBRIDEMENT  11/09/2020   Procedure: CYST NECROSECTOMY;  Surgeon: Irving Copas., MD;  Location: [place];  Service: Gastroenterology;;   Current Outpatient Medications  Medication Sig Dispense Refill   lipase/protease/amylase (CREON) 36000 UNITS CPEP capsule 3 capsules with each meal and 1-2 capsules with each snack (up too 2 snacks 540 capsule 11   No current facility-administered medications for this visit.    Current Outpatient Medications:    lipase/protease/amylase (CREON) 36000 UNITS CPEP capsule, 3 capsules with each meal and 1-2 capsules with each snack (up too 2 snacks, Disp: 540 capsule, Rfl: 11 Allergies  Allergen Reactions   Desipramine Nausea Only and Rash   Family History  Problem Relation Age of Onset   Diabetes Father    Heart disease Father    Heart disease Maternal Grandmother    Stroke Maternal Grandfather    Colon cancer Neg Hx    Esophageal cancer Neg Hx    Inflammatory bowel disease Neg Hx    Liver disease Neg Hx    Pancreatic cancer Neg Hx    Rectal cancer Neg Hx    Stomach cancer Neg Hx    Social History   Socioeconomic History   Marital status: Single    Spouse name: Not  on file   Number of children: Not on file   Years of education: Not  on file   Highest education level: Not on file  Occupational History   Not on file  Tobacco Use   Smoking status: Every Day    Packs/day: 0.25    Years: 26.00    Total pack years: 6.50    Types: Cigarettes   Smokeless tobacco: Never   Tobacco comments:    4-5 cig daily as of 10/07/2020.  Vaping Use   Vaping Use: Never used  Substance and Sexual Activity   Alcohol use: No   Drug use: No   Sexual activity: Never    Birth control/protection: None  Other Topics Concern   Not on file  Social History Narrative   Not on file   Social Determinants of Health   Financial Resource Strain: Not on file  Food Insecurity: Not on file  Transportation Needs: Not on file  Physical Activity: Not on file  Stress: Not on file  Social Connections: Not on file  Intimate Partner Violence: Not on file    Physical Exam: There were no vitals filed for this visit. There is no height or weight on file to calculate BMI. GEN: NAD EYE: Sclerae anicteric ENT: MMM CV: Non-tachycardic GI: Soft, NT/ND NEURO:  Alert & Oriented x 3  Lab Results: No results for input(s): "WBC", "HGB", "HCT", "PLT" in the last 72 hours. BMET No results for input(s): "NA", "K", "CL", "CO2", "GLUCOSE", "BUN", "CREATININE", "CALCIUM" in the last 72 hours. LFT No results for input(s): "PROT", "ALBUMIN", "AST", "ALT", "ALKPHOS", "BILITOT", "BILIDIR", "IBILI" in the last 72 hours. PT/INR No results for input(s): "LABPROT", "INR" in the last 72 hours.   Impression / Plan: This is a 51 y.o.male with a pmh significant for colonoscopy for surveillance in setting of previous advanced adenoma and fair preparation.   The risks and benefits of endoscopic evaluation/treatment were discussed with the patient and/or family; these include but are not limited to the risk of perforation, infection, bleeding, missed lesions, lack of diagnosis, severe illness requiring hospitalization, as well as anesthesia and sedation related  illnesses.  The patient's history has been reviewed, patient examined, no change in status, and deemed stable for procedure.  The patient and/or family is agreeable to proceed.    Justice Britain, MD [facility] Advanced [facility]oscopy Office # [phone]

## 2022-02-09 ENCOUNTER — Telehealth: Payer: Self-pay | Admitting: *Deleted

## 2022-02-09 NOTE — Telephone Encounter (Signed)
Post procedure follow up call placed, no answer and no VM.

## 2022-02-11 ENCOUNTER — Encounter: Payer: Self-pay | Admitting: Gastroenterology

## 2022-04-14 ENCOUNTER — Other Ambulatory Visit: Payer: Self-pay

## 2022-04-14 ENCOUNTER — Ambulatory Visit (INDEPENDENT_AMBULATORY_CARE_PROVIDER_SITE_OTHER): Payer: Medicaid Other | Admitting: Gastroenterology

## 2022-04-14 ENCOUNTER — Encounter: Payer: Self-pay | Admitting: Gastroenterology

## 2022-04-14 ENCOUNTER — Other Ambulatory Visit (INDEPENDENT_AMBULATORY_CARE_PROVIDER_SITE_OTHER): Payer: Medicaid Other

## 2022-04-14 VITALS — BP 120/76 | HR 72 | Ht 70.0 in | Wt 213.5 lb

## 2022-04-14 DIAGNOSIS — K8681 Exocrine pancreatic insufficiency: Secondary | ICD-10-CM | POA: Diagnosis not present

## 2022-04-14 DIAGNOSIS — R932 Abnormal findings on diagnostic imaging of liver and biliary tract: Secondary | ICD-10-CM

## 2022-04-14 DIAGNOSIS — R1010 Upper abdominal pain, unspecified: Secondary | ICD-10-CM | POA: Insufficient documentation

## 2022-04-14 DIAGNOSIS — K8689 Other specified diseases of pancreas: Secondary | ICD-10-CM | POA: Insufficient documentation

## 2022-04-14 DIAGNOSIS — K838 Other specified diseases of biliary tract: Secondary | ICD-10-CM | POA: Diagnosis not present

## 2022-04-14 DIAGNOSIS — K831 Obstruction of bile duct: Secondary | ICD-10-CM | POA: Diagnosis not present

## 2022-04-14 DIAGNOSIS — R197 Diarrhea, unspecified: Secondary | ICD-10-CM

## 2022-04-14 DIAGNOSIS — Z8719 Personal history of other diseases of the digestive system: Secondary | ICD-10-CM

## 2022-04-14 DIAGNOSIS — Z86010 Personal history of colonic polyps: Secondary | ICD-10-CM

## 2022-04-14 DIAGNOSIS — R748 Abnormal levels of other serum enzymes: Secondary | ICD-10-CM

## 2022-04-14 DIAGNOSIS — R109 Unspecified abdominal pain: Secondary | ICD-10-CM

## 2022-04-14 DIAGNOSIS — R1013 Epigastric pain: Secondary | ICD-10-CM | POA: Insufficient documentation

## 2022-04-14 DIAGNOSIS — F17200 Nicotine dependence, unspecified, uncomplicated: Secondary | ICD-10-CM

## 2022-04-14 LAB — COMPREHENSIVE METABOLIC PANEL
ALT: 24 U/L (ref 0–53)
AST: 15 U/L (ref 0–37)
Albumin: 4.6 g/dL (ref 3.5–5.2)
Alkaline Phosphatase: 179 U/L — ABNORMAL HIGH (ref 39–117)
BUN: 17 mg/dL (ref 6–23)
CO2: 25 mEq/L (ref 19–32)
Calcium: 9.7 mg/dL (ref 8.4–10.5)
Chloride: 101 mEq/L (ref 96–112)
Creatinine, Ser: 1.05 mg/dL (ref 0.40–1.50)
GFR: 82.36 mL/min (ref >60.00)
Glucose, Bld: 105 mg/dL — ABNORMAL HIGH (ref 70–99)
Potassium: 3.7 mEq/L (ref 3.5–5.1)
Sodium: 138 mEq/L (ref 135–145)
Total Bilirubin: 0.6 mg/dL (ref 0.2–1.2)
Total Protein: 8 g/dL (ref 6.0–8.3)

## 2022-04-14 LAB — AMYLASE: Amylase: 37 U/L (ref 27–131)

## 2022-04-14 LAB — LIPASE: Lipase: 118 U/L — ABNORMAL HIGH (ref 11.0–59.0)

## 2022-04-14 MED ORDER — PANCRELIPASE (LIP-PROT-AMYL) 36000-114000 UNITS PO CPEP: ORAL_CAPSULE | ORAL | 11 refills | Status: DC

## 2022-04-14 NOTE — Progress Notes (Signed)
 GASTROENTEROLOGY OUTPATIENT CLINIC VISIT   Primary Care Provider Gottschalk, Ashly M, DO [address] [name] [address] [phone]  Patient Profile: Steve Romero is a 51 y.o. male with a pmh significant for bipolar disorder, schizoaffective disorder, hypertension, hyperlipidemia, OSA, complicated necrotizing pancreatitis (secondary to gallstone/post-ERCP pancreatitis with eventual smoldering complications of biliary obstruction and recurrent pseudocysts), chronic abdominal discomfort, colon polyps (TAs).  The patient presents to the [facility] for an evaluation and management of problem(s) noted below:  Problem List 1. History of pancreatitis   2. Pain of upper abdomen   3. Exocrine pancreatic insufficiency   4. Tobacco use disorder   5. Biliary stricture   6. Elevated alkaline phosphatase level   7. Abnormal CT of liver   8. Hx of adenomatous colonic polyps     History of Present Illness Please see prior notes for full details of HPI.  Interval History The patient returns for follow-up and is unaccompanied today.  I last saw the patient for his colonoscopy.  Patient states that he continues to have improving status overall and may be back to around 70% of where he was before his severe episode of gallstone/post ERCP pancreatitis.  He still experiences upper abdominal discomfort that comes and goes.  Thankfully his weight has been stable.  He has had an elevated alkaline phosphatase which has been concerning in the setting of his previous biliary stricture as to whether this could be playing some role or issue.  The patient continues to remain abstinent of any alcohol.  He continues to use tobacco products (approximately 1/4 pack/day).  The patient has had a stable weight overall.  He continues to take his pancreas enzyme replacement therapy though there are days that he misses it.  He is not sure if he has an significant changes in his pain or bowels when he  misses a dose however.  GI Review of Systems Positive as above Negative for nausea, vomiting, dysphagia, odynophagia, hematochezia, melena  Review of Systems General: Denies fevers/chills/unintentional weight loss Cardiovascular: Denies chest pain Pulmonary: Denies shortness of breath Gastroenterological: See HPI Genitourinary: As per HPI Hematological: Denies easy bruising/bleeding Dermatological: Denies jaundice Psychological: Mood is stable   Medications Current Outpatient Medications  Medication Sig Dispense Refill   lipase/protease/amylase (CREON) 36000 UNITS CPEP capsule 2 capsules with each meal and 1-2 capsules with each snack (up too 2 snacks 540 capsule 11   No current facility-administered medications for this visit.    Allergies Allergies  Allergen Reactions   Desipramine Nausea Only and Rash    Histories Past Medical History:  Diagnosis Date   Acute pancreatitis without necrosis or infection, unspecified    Anxiety 10/2014   Ascites    Bipolar disorder (HCC) age 17   COPD (chronic obstructive pulmonary disease) (HCC) 2016   Depression age 17   Enlarged prostate    Hyperlipidemia 2016   Hypertension 2013   Schizoaffective disorder (HCC) 11/13/2014   Sleep apnea    Does not wear c-pap, sleeps in sitting up position per pt   Thyroid disease 11/2014   Past Surgical History:  Procedure Laterality Date   BALLOON DILATION N/A 11/07/2020   Procedure: BALLOON DILATION;  Surgeon: Mansouraty,  Jr., MD;  [facility];  Service: Gastroenterology;  Laterality: N/A;   BALLOON DILATION N/A 06/26/2021   Procedure: BALLOON DILATION;  Surgeon: Mansouraty,  Jr., MD;  [facility];  Service: Gastroenterology;  Laterality: N/A;   BILIARY DILATION  07/24/2021     Procedure: BILIARY DILATION;  Surgeon: Rush Landmark Telford Nab., MD;  Location: [place];  Service: Gastroenterology;;   BILIARY STENT PLACEMENT N/A 10/10/2020   Procedure:  BILIARY STENT PLACEMENT;  Surgeon: Rogene Houston, MD;  [facility]: [facility];  Service: Endoscopy;  Laterality: N/A;   BILIARY STENT PLACEMENT  11/09/2020   Procedure: BILIARY STENT PLACEMENT;  Surgeon: Rush Landmark Telford Nab., MD;  Location: [place];  Service: Gastroenterology;;   BILIARY STENT PLACEMENT  11/07/2020   Procedure: BILIARY STENT PLACEMENT;  Surgeon: Irving Copas., MD;  Location: [place];  Service: Gastroenterology;;   BILIARY STENT PLACEMENT  11/11/2020   Procedure: BILIARY STENT PLACEMENT;  Surgeon: Irving Copas., MD;  Location: [place];  Service: Gastroenterology;;   BILIARY STENT PLACEMENT  11/14/2020   Procedure: BILIARY STENT PLACEMENT;  Surgeon: Irving Copas., MD;  Location: [place];  Service: Gastroenterology;;   BILIARY STENT PLACEMENT N/A 01/29/2021   Procedure: BILIARY STENT PLACEMENT;  Surgeon: Irving Copas., MD;  Location[facility];  Service: Gastroenterology;  Laterality: N/A;   BILIARY STENT PLACEMENT  06/26/2021   Procedure: BILIARY STENT PLACEMENT;  Surgeon: Rush Landmark Telford Nab., MD;  Location: [place];  Service: Gastroenterology;;   BILIARY STENT PLACEMENT  07/24/2021   Procedure: BILIARY STENT PLACEMENT;  Surgeon: Irving Copas., MD;  Location: [place];  Service: Gastroenterology;;   BIOPSY  11/07/2020   Procedure: BIOPSY;  Surgeon: Irving Copas., MD;  Location: [place];  Service: Gastroenterology;;   BIOPSY  12/20/2020   Procedure: BIOPSY;  Surgeon: Irving Copas., MD;  Location: [facility];  Service: Gastroenterology;;   BIOPSY  06/26/2021   Procedure: BIOPSY;  Surgeon: Irving Copas., MD;  Location: [place];  Service: Gastroenterology;;   CARPAL TUNNEL RELEASE Left 02/21/2016   Procedure: CARPAL TUNNEL RELEASE;  Surgeon: Carole Civil, MD;  [facility];  Service: Orthopedics;  Laterality: Left;   CHOLECYSTECTOMY N/A 10/09/2020    Procedure: LAPAROSCOPIC CHOLECYSTECTOMY;  Surgeon: Virl Cagey, MD;  [facility];  Service: General;  Laterality: N/A;   CYST ENTEROSTOMY N/A 11/07/2020   Procedure: CYST ENTEROSTOMY;  Surgeon: Irving Copas., MD;  Location: [place];  Service: Gastroenterology;  Laterality: N/A;   CYST GASTROSTOMY  11/11/2020   Procedure: CYST NECROSECTOMY;  Surgeon: Rush Landmark Telford Nab., MD;  Location: [place];  Service: Gastroenterology;;   CYST GASTROSTOMY  11/14/2020   Procedure: CYST NECROSECTOMY;  Surgeon: Irving Copas., MD;  Location: [place];  Service: Gastroenterology;;   CYST REMOVAL HAND     CYSTOSCOPY  06/26/2021   Procedure: CYSTOGASTROSTOMY;  Surgeon: Mansouraty, Telford Nab., MD;  Location: [facility];  Service: Gastroenterology;;   ENDOSCOPIC RETROGRADE CHOLANGIOPANCREATOGRAPHY (ERCP) WITH PROPOFOL N/A 11/09/2020   Procedure: ENDOSCOPIC RETROGRADE CHOLANGIOPANCREATOGRAPHY (ERCP) WITH PROPOFOL;  Surgeon: Irving Copas., MD;  Location: [place];  Service: Gastroenterology;  Laterality: N/A;   ENDOSCOPIC RETROGRADE CHOLANGIOPANCREATOGRAPHY (ERCP) WITH PROPOFOL N/A 12/20/2020   Procedure: ENDOSCOPIC RETROGRADE CHOLANGIOPANCREATOGRAPHY (ERCP) WITH PROPOFOL;  Surgeon: Rush Landmark Telford Nab., MD;  [facility]: [facility];  Service: Gastroenterology;  Laterality: N/A;   ENDOSCOPIC RETROGRADE CHOLANGIOPANCREATOGRAPHY (ERCP) WITH PROPOFOL N/A 01/29/2021   Procedure: ENDOSCOPIC RETROGRADE CHOLANGIOPANCREATOGRAPHY (ERCP) WITH PROPOFOL;  Surgeon: Rush Landmark Telford Nab., MD;  [facility];  Service: Gastroenterology;  Laterality: N/A;   ENDOSCOPIC RETROGRADE CHOLANGIOPANCREATOGRAPHY (ERCP) WITH PROPOFOL N/A 07/24/2021   Procedure: ENDOSCOPIC RETROGRADE CHOLANGIOPANCREATOGRAPHY (ERCP) WITH PROPOFOL;  Surgeon: Rush Landmark Telford Nab., MD;  Location: [place];  Service: Gastroenterology;  Laterality: N/A;   ERCP  N/A 10/10/2020    Procedure: ENDOSCOPIC RETROGRADE CHOLANGIOPANCREATOGRAPHY (ERCP);  Surgeon: Rehman, Najeeb U, MD;  [facility];  Service: Endoscopy;  Laterality: N/A;   ERCP     ESOPHAGOGASTRODUODENOSCOPY N/A 11/11/2020   Procedure: ESOPHAGOGASTRODUODENOSCOPY (EGD);  Surgeon: Mansouraty,  Jr., MD;  [facility];  Service: Gastroenterology;  Laterality: N/A;   ESOPHAGOGASTRODUODENOSCOPY N/A 11/14/2020   Procedure: ESOPHAGOGASTRODUODENOSCOPY (EGD);  Surgeon: Mansouraty,  Jr., MD;  [facility];  Service: Gastroenterology;  Laterality: N/A;   ESOPHAGOGASTRODUODENOSCOPY (EGD) WITH PROPOFOL N/A 11/09/2020   Procedure: ESOPHAGOGASTRODUODENOSCOPY (EGD) WITH PROPOFOL;  Surgeon: Mansouraty,  Jr., MD;  [facility];  Service: Gastroenterology;  Laterality: N/A;   ESOPHAGOGASTRODUODENOSCOPY (EGD) WITH PROPOFOL N/A 11/07/2020   Procedure: ESOPHAGOGASTRODUODENOSCOPY (EGD) WITH PROPOFOL;  Surgeon: Mansouraty,  Jr., MD;  [facility];  Service: Gastroenterology;  Laterality: N/A;   ESOPHAGOGASTRODUODENOSCOPY (EGD) WITH PROPOFOL N/A 12/20/2020   Procedure: ESOPHAGOGASTRODUODENOSCOPY (EGD) WITH PROPOFOL;  Surgeon: Mansouraty,  Jr., MD;  [facility];  Service: Gastroenterology;  Laterality: N/A;   ESOPHAGOGASTRODUODENOSCOPY (EGD) WITH PROPOFOL N/A 06/26/2021   Procedure: ESOPHAGOGASTRODUODENOSCOPY (EGD) WITH PROPOFOL;  Surgeon: Mansouraty,  Jr., MD;  [facility];  Service: Gastroenterology;  Laterality: N/A;   ESOPHAGOGASTRODUODENOSCOPY (EGD) WITH PROPOFOL N/A 07/24/2021   Procedure: ESOPHAGOGASTRODUODENOSCOPY (EGD) WITH PROPOFOL;  Surgeon: Mansouraty,  Jr., MD;  [facility];  Service: Gastroenterology;  Laterality: N/A;  AXIOS STENT   EUS  11/14/2020   Procedure: UPPER ENDOSCOPIC ULTRASOUND (EUS) LINEAR;  Surgeon: Mansouraty,  Jr., MD;  [facility];  Service: Gastroenterology;;   EUS N/A  06/26/2021   Procedure: UPPER ENDOSCOPIC ULTRASOUND (EUS) RADIAL;  Surgeon: Mansouraty,  Jr., MD;  [facility];  Service: Gastroenterology;  Laterality: N/A;   GASTROINTESTINAL STENT REMOVAL  12/20/2020   Procedure: GASTROINTESTINAL STENT REMOVAL;  Surgeon: Mansouraty,  Jr., MD;  [facility];  Service: Gastroenterology;;  cyst gastrostomy stent and double pig tail stents x2 removed   HERNIA REPAIR Left 2002   groin   INGUINAL HERNIA REPAIR Left 04/05/2017   Procedure: RECURRENT HERNIA REPAIR INGUINAL ADULT WITH MESH;  Surgeon: Jenkins, Mark, MD;  [facility];  Service: General;  Laterality: Left;   MASS EXCISION Right 04/29/2020   Procedure: EXCISION MASS RIGHT WRIST;  Surgeon: Kuzma, Kevin, MD;  Location: [facility];  Service: Orthopedics;  Laterality: Right;   MASS EXCISION Right 10/09/2020   Procedure: EXCISION MASS, ABDOMINAL WALL, 2CM;  Surgeon: Bridges, Lindsay C, MD;  [facility];  Service: General;  Laterality: Right;   PANCREATIC STENT PLACEMENT  06/26/2021   Procedure: AXIOS STENT PLACEMENT;  Surgeon: Mansouraty,  Jr., MD;  [facility];  Service: Gastroenterology;;   REMOVAL OF STONES  12/20/2020   Procedure: REMOVAL OF STONES;  Surgeon: Mansouraty,  Jr., MD;  [facility];  Service: Gastroenterology;;   REMOVAL OF STONES  07/24/2021   Procedure: REMOVAL OF STONES;  Surgeon: Mansouraty,  Jr., MD;  [facility];  Service: Gastroenterology;;   SPHINCTEROTOMY N/A 10/10/2020   Procedure: SPHINCTEROTOMY;  Surgeon: Rehman, Najeeb U, MD;  [facility];  Service: Endoscopy;  Laterality: N/A;   SPHINCTEROTOMY  07/24/2021   Procedure: SPHINCTEROTOMY;  Surgeon: Mansouraty,  Jr., MD;  [facility];  Service: Gastroenterology;;   SPYGLASS CHOLANGIOSCOPY N/A 12/20/2020   Procedure: SPYGLASS CHOLANGIOSCOPY;  Surgeon: Mansouraty,  Jr., MD;  [facility];  Service: Gastroenterology;  Laterality: N/A;   SPYGLASS CHOLANGIOSCOPY N/A 07/24/2021   Procedure: SPYGLASS CHOLANGIOSCOPY;    Surgeon: Mansouraty,  Jr., MD;  [facility];  Service: Gastroenterology;  Laterality: N/A;   STENT REMOVAL  11/09/2020   Procedure: STENT REMOVAL;  Surgeon: Mansouraty,  Jr., MD;  [facility];  Service: Gastroenterology;;   STENT REMOVAL  11/11/2020   Procedure: STENT REMOVAL;  Surgeon: Mansouraty,  Jr., MD;  [facility];  Service: Gastroenterology;;   STENT REMOVAL  11/14/2020   Procedure: STENT REMOVAL;  Surgeon: Mansouraty,  Jr., MD;  [facility];  Service: Gastroenterology;;   STENT REMOVAL  12/20/2020   Procedure: STENT REMOVAL;  Surgeon: Mansouraty,  Jr., MD;  [facility];  Service: Gastroenterology;;  biliary x2   STENT REMOVAL  07/24/2021   Procedure: AXIOS STENT REMOVAL;  Surgeon: Mansouraty,  Jr., MD;  [facility];  Service: Gastroenterology;;   UPPER ESOPHAGEAL ENDOSCOPIC ULTRASOUND (EUS) N/A 11/07/2020   Procedure: UPPER ESOPHAGEAL ENDOSCOPIC ULTRASOUND (EUS);  Surgeon: Mansouraty,  Jr., MD;  [facility];  Service: Gastroenterology;  Laterality: N/A;   UPPER GASTROINTESTINAL ENDOSCOPY     WOUND DEBRIDEMENT  11/09/2020   Procedure: CYST NECROSECTOMY;  Surgeon: Mansouraty,  Jr., MD;  [facility];  Service: Gastroenterology;;   Social History   Socioeconomic History   Marital status: Single    Spouse name: Not on file   Number of children: Not on file   Years of education: Not on file   Highest education level: Not on file  Occupational History   Not on file  Tobacco Use   Smoking status: Every Day    Packs/day: 0.25    Years: 26.00    Total pack years: 6.50    Types: Cigarettes   Smokeless tobacco: Never   Tobacco comments:    4-5 cig daily as of 10/07/2020.  Vaping Use   Vaping Use: Never used   Substance and Sexual Activity   Alcohol use: No   Drug use: No   Sexual activity: Never    Birth control/protection: None  Other Topics Concern   Not on file  Social History Narrative   Not on file   Social Determinants of Health   Financial Resource Strain: Not on file  Food Insecurity: Not on file  Transportation Needs: Not on file  Physical Activity: Not on file  Stress: Not on file  Social Connections: Not on file  Intimate Partner Violence: Not on file   Family History  Problem Relation Age of Onset   Diabetes Father    Heart disease Father    Heart disease Maternal Grandmother    Stroke Maternal Grandfather    Colon cancer Neg Hx    Esophageal cancer Neg Hx    Inflammatory bowel disease Neg Hx    Liver disease Neg Hx    Pancreatic cancer Neg Hx    Rectal cancer Neg Hx    Stomach cancer Neg Hx    I have reviewed his medical, social, and family history in detail and updated the electronic medical record as necessary.    PHYSICAL EXAMINATION  BP 120/76   Pulse 72   Ht 5' 10" (1.778 m)   Wt 213 lb 8 oz (96.8 kg)   SpO2 92%   BMI 30.63 kg/m  Wt Readings from Last 3 Encounters:  04/14/22 213 lb 8 oz (96.8 kg)  02/06/22 216 lb (98 kg)  01/27/22 216 lb 9.6 oz (98.2 kg)  GEN: NAD, appears stated age, doesn't appear chronically ill PSYCH: Cooperative, without pressured speech EYE: Sclera anicteric ENT: MMM   CV: Nontachycardic RESP: No audible wheezing GI: NABS, soft, protuberant abdomen, TTP in upper abdomen, no rebound, volitional guarding present MSK/EXT: No significant lower extremity edema SKIN: No jaundice NEURO:  Alert & Oriented x 3, no focal deficits   REVIEW OF DATA  I reviewed the following data at the time of this encounter:  GI Procedures and Studies  August 2023 colonoscopy - Hemorrhoids found on digital rectal exam. - The examined portion of the ileum was normal. - Four 3 to 6 mm polyps in the rectum, at the recto-sigmoid colon and in the  transverse colon, removed with a cold snare. Resected and retrieved. - Normal mucosa in the entire examined colon otherwise. - Non-bleeding non-thrombosed external and internal hemorrhoids. Pathology Diagnosis Surgical [P], colon, transverse, recto sigmoid, rectal, polyp (4) TUBULAR ADENOMA, SESSILE SERRATED ADENOMA AND A HYPERPLASTIC POLYP. NEGATIVE FOR HIGH-GRADE DYSPLASIA  Laboratory Studies  Reviewed those in epic  Imaging Studies  March 2023 CTAP IMPRESSION: Interval removal of common bile duct stents when compare with the prior CT examination. Mild residual biliary ductal dilatation is seen in the intrahepatic and extrahepatic ductal system. Chronic changes about the pancreas with some scarring and thinning of the mid body of the pancreas these changes are similar to that seen on prior exam and likely related to prior interventions. No residual pseudocyst is seen. No acute pancreatitis is noted. Fluid in the small bowel and colon which may be related to a diarrheal state. Clinical correlation is recommended. No other focal abnormality is noted.   ASSESSMENT  Mr. Dardis is a 51 y.o. male with a pmh significant for bipolar disorder, schizoaffective disorder, hypertension, hyperlipidemia, OSA, complicated necrotizing pancreatitis (secondary to gallstone/post-ERCP pancreatitis with eventual smoldering complications of biliary obstruction and recurrent pseudocysts), chronic abdominal discomfort, colon polyps (TAs).  The patient is seen today for evaluation and management of:  1. History of pancreatitis   2. Pain of upper abdomen   3. Exocrine pancreatic insufficiency   4. Tobacco use disorder   5. Biliary stricture   6. Elevated alkaline phosphatase level   7. Abnormal CT of liver   8. Hx of adenomatous colonic polyps    The patient is hemodynamically stable.  Clinically he seems to continue to make improvement from his overall status from a year and a half ago and since our  biliary stent removal.  In the setting of his previous concern for pancreatic duct disruption and his persisting abdominal pain we will obtain laboratories as well as do cross-sectional follow-up.  We will plan a pancreas protocol CT abdomen to see if anything else is developed.  If nothing else is developed, then hopefully he will no longer need any further imaging studies.  I have asked the patient to continue to work on tobacco cessation.  We will see what the cross-sectional imaging shows in regards to his previously elevated alkaline phosphatase and see what his labs look like today.  Hopefully will not have to replace another biliary stent.  It would be interesting to actually see in the setting of his previous significant/severe episode of necrotizing pancreatitis if he has true evidence of exocrine pancreas insufficiency.  If his fecal elastase is normal, we may consider trying to come down on his PERT therapy.  Time will tell.  He will be due for a 3-year follow-up colonoscopy in the setting of his adenomatous colon polyps.  All patient questions were answered to the best of my ability, and the patient agrees to the aforementioned plan   of action with follow-up as indicated.   PLAN  Laboratories as outlined below Pancreas protocol CT abdomen to be performed Continue Creon - 2 capsules with each meal - 1 capsule with each snack Fecal elastase to be obtained Continue daily PPI Low-fat diet Tobacco cessation recommended Colonoscopy in 2026 for surveillance   Orders Placed This Encounter  Procedures   CT ABDOMEN W WO CONTRAST   Comp Met (CMET)   Amylase   Lipase   Pancreatic elastase, fecal    New Prescriptions   No medications on file   Modified Medications   Modified Medication Previous Medication   LIPASE/PROTEASE/AMYLASE (CREON) 36000 UNITS CPEP CAPSULE lipase/protease/amylase (CREON) 36000 UNITS CPEP capsule      2 capsules with each meal and 1-2 capsules with each snack (up  too 2 snacks    3 capsules with each meal and 1-2 capsules with each snack (up too 2 snacks    Planned Follow Up No follow-ups on file.   Total Time in Face-to-Face and in Coordination of Care for patient including independent/personal interpretation/review of prior testing, medical history, examination, medication adjustment, communicating results with the patient directly, and documentation with the EHR is 25 minutes.   Justice Britain, MD [facility] Office # [phone]    5 PM addendum Patient's laboratories were evaluated and he has evidence of jaundice at this time.  Leukocytosis is the best it has been over the course of last 6 weeks.  Will initiate ciprofloxacin 500 mg twice daily as prophylaxis against cholangitis.  If the patient develops progressive fevers (greater than 100.3) or chills or worsening symptoms he will come into the hospital after calling to update [place].  He may require ERCP earlier in the next week if that is the case.  I spoke with the patient and patient's sister about this and they agree with this plan of action

## 2022-04-14 NOTE — Patient Instructions (Signed)
Your provider has requested that you go to the basement level for lab work before leaving today. Press "B" on the elevator. The lab is located at the first door on the left as you exit the elevator.   Change your Creon to 2 pills with each meal and 1-2 pills with each snack daily.   You have been scheduled for a CT scan of the abdomen and pelvis at [facility], 1st floor Radiology. You are scheduled on 04/21/22 at 3:30 pm . You should arrive 2 hours  prior to your appointment time  at 1:30 pm  to drink preparation prior to CT Scan.    Please follow the written instructions below on the day of your exam:   1) Do not eat anything after 11:30 am  (4 hours prior to your test)     You may take any medications as prescribed with a small amount of water, if necessary. If you take any of the following medications: METFORMIN, GLUCOPHAGE, GLUCOVANCE, AVANDAMET, RIOMET, FORTAMET, Brady MET, JANUMET, GLUMETZA or METAGLIP, you MAY be asked to HOLD this medication 48 hours AFTER the exam.   The purpose of you drinking the oral contrast is to aid in the visualization of your intestinal tract. The contrast solution may cause some diarrhea. Depending on your individual set of symptoms, you may also receive an intravenous injection of x-ray contrast/dye. Plan on being at [facility] for 45 minutes or longer, depending on the type of exam you are having performed.   If you have any questions regarding your exam or if you need to reschedule, you may call [facility] at [phone] between the hours of 8:00 am and 5:00 pm, Monday-Friday.   Thank you for choosing me and [facility].  Dr. Rush Landmark

## 2022-04-16 ENCOUNTER — Other Ambulatory Visit: Payer: Medicaid Other

## 2022-04-16 DIAGNOSIS — Z86010 Personal history of colonic polyps: Secondary | ICD-10-CM

## 2022-04-16 DIAGNOSIS — F17200 Nicotine dependence, unspecified, uncomplicated: Secondary | ICD-10-CM

## 2022-04-16 DIAGNOSIS — Z8719 Personal history of other diseases of the digestive system: Secondary | ICD-10-CM

## 2022-04-16 DIAGNOSIS — K8681 Exocrine pancreatic insufficiency: Secondary | ICD-10-CM

## 2022-04-16 DIAGNOSIS — R1010 Upper abdominal pain, unspecified: Secondary | ICD-10-CM

## 2022-04-16 NOTE — Addendum Note (Signed)
Addended by: Guerry Bruin on: 04/16/2022 08:28 AM   Modules accepted: Orders

## 2022-04-21 ENCOUNTER — Ambulatory Visit (HOSPITAL_COMMUNITY)
Admission: RE | Admit: 2022-04-21 | Discharge: 2022-04-21 | Disposition: A | Discharge status: Home / Self Care | Payer: Medicaid Other | Source: Ambulatory Visit | Attending: Gastroenterology | Admitting: Gastroenterology

## 2022-04-21 DIAGNOSIS — R1010 Upper abdominal pain, unspecified: Secondary | ICD-10-CM

## 2022-04-21 DIAGNOSIS — F17200 Nicotine dependence, unspecified, uncomplicated: Secondary | ICD-10-CM

## 2022-04-21 DIAGNOSIS — K8681 Exocrine pancreatic insufficiency: Secondary | ICD-10-CM | POA: Diagnosis present

## 2022-04-21 DIAGNOSIS — Z860101 Personal history of adenomatous and serrated colon polyps: Secondary | ICD-10-CM

## 2022-04-21 DIAGNOSIS — Z8719 Personal history of other diseases of the digestive system: Secondary | ICD-10-CM | POA: Diagnosis present

## 2022-04-21 DIAGNOSIS — Z86010 Personal history of colonic polyps: Secondary | ICD-10-CM | POA: Diagnosis present

## 2022-04-21 LAB — PANCREATIC ELASTASE, FECAL: Pancreatic Elastase, Fecal: <50 ug Elast./g — ABNORMAL LOW (ref >200)

## 2022-04-21 MED ORDER — IOHEXOL 300 MG/ML  SOLN
100.0000 mL | Freq: Once | INTRAMUSCULAR | Status: AC | PRN
  Administered 2022-04-21: 100 mL via INTRAVENOUS

## 2022-05-17 ENCOUNTER — Emergency Department (HOSPITAL_COMMUNITY)
Admission: EM | Admit: 2022-05-17 | Discharge: 2022-05-17 | Discharge status: Against Medical Advice | Payer: Medicaid Other | Source: Home / Self Care

## 2022-05-17 ENCOUNTER — Other Ambulatory Visit: Payer: Self-pay

## 2022-05-17 ENCOUNTER — Emergency Department (HOSPITAL_COMMUNITY): Payer: Medicaid Other

## 2022-05-17 ENCOUNTER — Encounter (HOSPITAL_COMMUNITY): Payer: Self-pay | Admitting: Pharmacy Technician

## 2022-05-17 ENCOUNTER — Encounter (HOSPITAL_COMMUNITY): Payer: Self-pay

## 2022-05-17 ENCOUNTER — Inpatient Hospital Stay (HOSPITAL_COMMUNITY)
Admission: EM | Admit: 2022-05-17 | Discharge: 2022-05-24 | DRG: 439 | Disposition: A | Discharge status: Home / Self Care | Payer: Medicaid Other | Attending: Internal Medicine | Admitting: Internal Medicine

## 2022-05-17 DIAGNOSIS — I10 Essential (primary) hypertension: Secondary | ICD-10-CM | POA: Diagnosis present

## 2022-05-17 DIAGNOSIS — R7303 Prediabetes: Secondary | ICD-10-CM | POA: Diagnosis not present

## 2022-05-17 DIAGNOSIS — R1013 Epigastric pain: Secondary | ICD-10-CM | POA: Insufficient documentation

## 2022-05-17 DIAGNOSIS — E860 Dehydration: Secondary | ICD-10-CM | POA: Diagnosis present

## 2022-05-17 DIAGNOSIS — Z9049 Acquired absence of other specified parts of digestive tract: Secondary | ICD-10-CM | POA: Diagnosis not present

## 2022-05-17 DIAGNOSIS — F250 Schizoaffective disorder, bipolar type: Secondary | ICD-10-CM | POA: Diagnosis present

## 2022-05-17 DIAGNOSIS — R7989 Other specified abnormal findings of blood chemistry: Secondary | ICD-10-CM | POA: Diagnosis not present

## 2022-05-17 DIAGNOSIS — R739 Hyperglycemia, unspecified: Secondary | ICD-10-CM | POA: Diagnosis not present

## 2022-05-17 DIAGNOSIS — R932 Abnormal findings on diagnostic imaging of liver and biliary tract: Secondary | ICD-10-CM

## 2022-05-17 DIAGNOSIS — N179 Acute kidney failure, unspecified: Secondary | ICD-10-CM | POA: Diagnosis present

## 2022-05-17 DIAGNOSIS — E669 Obesity, unspecified: Secondary | ICD-10-CM | POA: Diagnosis present

## 2022-05-17 DIAGNOSIS — R197 Diarrhea, unspecified: Secondary | ICD-10-CM | POA: Insufficient documentation

## 2022-05-17 DIAGNOSIS — E119 Type 2 diabetes mellitus without complications: Secondary | ICD-10-CM

## 2022-05-17 DIAGNOSIS — K3189 Other diseases of stomach and duodenum: Secondary | ICD-10-CM | POA: Diagnosis not present

## 2022-05-17 DIAGNOSIS — R609 Edema, unspecified: Secondary | ICD-10-CM | POA: Diagnosis present

## 2022-05-17 DIAGNOSIS — K861 Other chronic pancreatitis: Secondary | ICD-10-CM | POA: Diagnosis present

## 2022-05-17 DIAGNOSIS — K8681 Exocrine pancreatic insufficiency: Secondary | ICD-10-CM | POA: Diagnosis present

## 2022-05-17 DIAGNOSIS — K8500 Idiopathic acute pancreatitis without necrosis or infection: Secondary | ICD-10-CM | POA: Diagnosis not present

## 2022-05-17 DIAGNOSIS — J449 Chronic obstructive pulmonary disease, unspecified: Secondary | ICD-10-CM | POA: Diagnosis present

## 2022-05-17 DIAGNOSIS — E0810 Diabetes mellitus due to underlying condition with ketoacidosis without coma: Secondary | ICD-10-CM

## 2022-05-17 DIAGNOSIS — D751 Secondary polycythemia: Secondary | ICD-10-CM | POA: Diagnosis present

## 2022-05-17 DIAGNOSIS — G8929 Other chronic pain: Secondary | ICD-10-CM | POA: Diagnosis present

## 2022-05-17 DIAGNOSIS — R1110 Vomiting, unspecified: Secondary | ICD-10-CM | POA: Insufficient documentation

## 2022-05-17 DIAGNOSIS — N400 Enlarged prostate without lower urinary tract symptoms: Secondary | ICD-10-CM | POA: Diagnosis present

## 2022-05-17 DIAGNOSIS — K8050 Calculus of bile duct without cholangitis or cholecystitis without obstruction: Secondary | ICD-10-CM | POA: Diagnosis present

## 2022-05-17 DIAGNOSIS — Z86010 Personal history of colonic polyps: Secondary | ICD-10-CM | POA: Diagnosis not present

## 2022-05-17 DIAGNOSIS — G4730 Sleep apnea, unspecified: Secondary | ICD-10-CM | POA: Diagnosis present

## 2022-05-17 DIAGNOSIS — E785 Hyperlipidemia, unspecified: Secondary | ICD-10-CM | POA: Diagnosis present

## 2022-05-17 DIAGNOSIS — Z888 Allergy status to other drugs, medicaments and biological substances status: Secondary | ICD-10-CM

## 2022-05-17 DIAGNOSIS — Z8719 Personal history of other diseases of the digestive system: Secondary | ICD-10-CM | POA: Diagnosis not present

## 2022-05-17 DIAGNOSIS — Z5321 Procedure and treatment not carried out due to patient leaving prior to being seen by health care provider: Secondary | ICD-10-CM | POA: Insufficient documentation

## 2022-05-17 DIAGNOSIS — Z833 Family history of diabetes mellitus: Secondary | ICD-10-CM | POA: Diagnosis not present

## 2022-05-17 DIAGNOSIS — Z823 Family history of stroke: Secondary | ICD-10-CM | POA: Diagnosis not present

## 2022-05-17 DIAGNOSIS — Z6830 Body mass index (BMI) 30.0-30.9, adult: Secondary | ICD-10-CM

## 2022-05-17 DIAGNOSIS — E1159 Type 2 diabetes mellitus with other circulatory complications: Secondary | ICD-10-CM | POA: Diagnosis present

## 2022-05-17 DIAGNOSIS — K831 Obstruction of bile duct: Secondary | ICD-10-CM

## 2022-05-17 DIAGNOSIS — E1165 Type 2 diabetes mellitus with hyperglycemia: Secondary | ICD-10-CM | POA: Diagnosis not present

## 2022-05-17 DIAGNOSIS — K2289 Other specified disease of esophagus: Secondary | ICD-10-CM | POA: Diagnosis not present

## 2022-05-17 DIAGNOSIS — F419 Anxiety disorder, unspecified: Secondary | ICD-10-CM | POA: Diagnosis present

## 2022-05-17 DIAGNOSIS — Z8249 Family history of ischemic heart disease and other diseases of the circulatory system: Secondary | ICD-10-CM | POA: Diagnosis not present

## 2022-05-17 DIAGNOSIS — K863 Pseudocyst of pancreas: Secondary | ICD-10-CM | POA: Diagnosis present

## 2022-05-17 DIAGNOSIS — K838 Other specified diseases of biliary tract: Secondary | ICD-10-CM | POA: Diagnosis not present

## 2022-05-17 DIAGNOSIS — R1012 Left upper quadrant pain: Secondary | ICD-10-CM | POA: Insufficient documentation

## 2022-05-17 DIAGNOSIS — R748 Abnormal levels of other serum enzymes: Secondary | ICD-10-CM | POA: Diagnosis present

## 2022-05-17 DIAGNOSIS — I152 Hypertension secondary to endocrine disorders: Secondary | ICD-10-CM | POA: Diagnosis present

## 2022-05-17 DIAGNOSIS — K859 Acute pancreatitis without necrosis or infection, unspecified: Principal | ICD-10-CM | POA: Diagnosis present

## 2022-05-17 DIAGNOSIS — K862 Cyst of pancreas: Secondary | ICD-10-CM | POA: Diagnosis present

## 2022-05-17 DIAGNOSIS — F17210 Nicotine dependence, cigarettes, uncomplicated: Secondary | ICD-10-CM | POA: Diagnosis present

## 2022-05-17 DIAGNOSIS — K8689 Other specified diseases of pancreas: Secondary | ICD-10-CM | POA: Diagnosis present

## 2022-05-17 DIAGNOSIS — K858 Other acute pancreatitis without necrosis or infection: Secondary | ICD-10-CM | POA: Diagnosis not present

## 2022-05-17 DIAGNOSIS — I899 Noninfective disorder of lymphatic vessels and lymph nodes, unspecified: Secondary | ICD-10-CM | POA: Diagnosis not present

## 2022-05-17 HISTORY — DX: Acute kidney failure, unspecified: N17.9

## 2022-05-17 LAB — CBG MONITORING, ED
Glucose-Capillary: 419 mg/dL — ABNORMAL HIGH (ref 70–99)
Glucose-Capillary: 327 mg/dL — ABNORMAL HIGH (ref 70–99)
Glucose-Capillary: 330 mg/dL — ABNORMAL HIGH (ref 70–99)

## 2022-05-17 LAB — CBC WITH DIFFERENTIAL/PLATELET
Abs Immature Granulocytes: 0.11 10*3/uL — ABNORMAL HIGH (ref 0.00–0.07)
Basophils Absolute: 0.1 10*3/uL (ref 0.0–0.1)
Basophils Relative: 0 %
Eosinophils Absolute: 0 10*3/uL (ref 0.0–0.5)
Eosinophils Relative: 0 %
HCT: 58.3 % — ABNORMAL HIGH (ref 39.0–52.0)
Hemoglobin: 21.6 g/dL (ref 13.0–17.0)
Immature Granulocytes: 1 %
Lymphocytes Relative: 10 %
Lymphs Abs: 1.9 10*3/uL (ref 0.7–4.0)
MCH: 31.6 pg (ref 26.0–34.0)
MCHC: 37 g/dL — ABNORMAL HIGH (ref 30.0–36.0)
MCV: 85.4 fL (ref 80.0–100.0)
Monocytes Absolute: 1 10*3/uL (ref 0.1–1.0)
Monocytes Relative: 6 %
Neutro Abs: 15.6 10*3/uL — ABNORMAL HIGH (ref 1.7–7.7)
Neutrophils Relative %: 83 %
Platelets: 386 10*3/uL (ref 150–400)
RBC: 6.83 MIL/uL — ABNORMAL HIGH (ref 4.22–5.81)
RDW: 12.7 % (ref 11.5–15.5)
WBC: 18.8 10*3/uL — ABNORMAL HIGH (ref 4.0–10.5)
nRBC: 0 % (ref 0.0–0.2)

## 2022-05-17 LAB — COMPREHENSIVE METABOLIC PANEL
ALT: 42 U/L (ref 0–44)
AST: 24 U/L (ref 15–41)
Albumin: 4.8 g/dL (ref 3.5–5.0)
Alkaline Phosphatase: 233 U/L — ABNORMAL HIGH (ref 38–126)
Anion gap: 22 — ABNORMAL HIGH (ref 5–15)
BUN: 37 mg/dL — ABNORMAL HIGH (ref 6–20)
CO2: 21 mmol/L — ABNORMAL LOW (ref 22–32)
Calcium: 9.9 mg/dL (ref 8.9–10.3)
Chloride: 92 mmol/L — ABNORMAL LOW (ref 98–111)
Creatinine, Ser: 3.2 mg/dL — ABNORMAL HIGH (ref 0.61–1.24)
GFR, Estimated: 23 mL/min — ABNORMAL LOW (ref >60)
Glucose, Bld: 348 mg/dL — ABNORMAL HIGH (ref 70–99)
Potassium: 3.5 mmol/L (ref 3.5–5.1)
Sodium: 135 mmol/L (ref 135–145)
Total Bilirubin: 0.8 mg/dL (ref 0.3–1.2)
Total Protein: 9 g/dL — ABNORMAL HIGH (ref 6.5–8.1)

## 2022-05-17 LAB — I-STAT CHEM 8, ED
BUN: 45 mg/dL — ABNORMAL HIGH (ref 6–20)
Calcium, Ion: 0.94 mmol/L — ABNORMAL LOW (ref 1.15–1.40)
Chloride: 95 mmol/L — ABNORMAL LOW (ref 98–111)
Creatinine, Ser: 3.8 mg/dL — ABNORMAL HIGH (ref 0.61–1.24)
Glucose, Bld: 389 mg/dL — ABNORMAL HIGH (ref 70–99)
HCT: 54 % — ABNORMAL HIGH (ref 39.0–52.0)
Hemoglobin: 18.4 g/dL — ABNORMAL HIGH (ref 13.0–17.0)
Potassium: 3.7 mmol/L (ref 3.5–5.1)
Sodium: 134 mmol/L — ABNORMAL LOW (ref 135–145)
TCO2: 23 mmol/L (ref 22–32)

## 2022-05-17 LAB — BASIC METABOLIC PANEL
Anion gap: 21 — ABNORMAL HIGH (ref 5–15)
BUN: 50 mg/dL — ABNORMAL HIGH (ref 6–20)
CO2: 21 mmol/L — ABNORMAL LOW (ref 22–32)
Calcium: 9 mg/dL (ref 8.9–10.3)
Chloride: 93 mmol/L — ABNORMAL LOW (ref 98–111)
Creatinine, Ser: 3.95 mg/dL — ABNORMAL HIGH (ref 0.61–1.24)
GFR, Estimated: 18 mL/min — ABNORMAL LOW (ref >60)
Glucose, Bld: 371 mg/dL — ABNORMAL HIGH (ref 70–99)
Potassium: 3.6 mmol/L (ref 3.5–5.1)
Sodium: 135 mmol/L (ref 135–145)

## 2022-05-17 LAB — HEMOGLOBIN A1C
Hgb A1c MFr Bld: 6.3 % — ABNORMAL HIGH (ref 4.8–5.6)
Mean Plasma Glucose: 134.11 mg/dL

## 2022-05-17 LAB — BETA-HYDROXYBUTYRIC ACID: Beta-Hydroxybutyric Acid: 0.13 mmol/L (ref 0.05–0.27)

## 2022-05-17 MED ORDER — INSULIN REGULAR(HUMAN) IN NACL 100-0.9 UT/100ML-% IV SOLN
INTRAVENOUS | Status: DC
  Administered 2022-05-17: 5 [IU]/h via INTRAVENOUS
  Filled 2022-05-17: qty 100

## 2022-05-17 MED ORDER — ENOXAPARIN SODIUM 40 MG/0.4ML IJ SOSY
40.0000 mg | PREFILLED_SYRINGE | INTRAMUSCULAR | Status: DC
  Administered 2022-05-17: 40 mg via SUBCUTANEOUS
  Filled 2022-05-17: qty 0.4

## 2022-05-17 MED ORDER — MORPHINE SULFATE (PF) 2 MG/ML IV SOLN
2.0000 mg | INTRAVENOUS | Status: DC | PRN
  Administered 2022-05-18 – 2022-05-19 (×6): 2 mg via INTRAVENOUS
  Filled 2022-05-17 (×6): qty 1

## 2022-05-17 MED ORDER — PANCRELIPASE (LIP-PROT-AMYL) 12000-38000 UNITS PO CPEP
72000.0000 [IU] | ORAL_CAPSULE | Freq: Three times a day (TID) | ORAL | Status: DC
  Administered 2022-05-18 – 2022-05-24 (×16): 72000 [IU] via ORAL
  Filled 2022-05-17 (×5): qty 6
  Filled 2022-05-17: qty 2
  Filled 2022-05-17 (×8): qty 6
  Filled 2022-05-17: qty 2
  Filled 2022-05-17: qty 6
  Filled 2022-05-17: qty 2
  Filled 2022-05-17: qty 6

## 2022-05-17 MED ORDER — ACETAMINOPHEN 650 MG RE SUPP: 650.0000 mg | Freq: Four times a day (QID) | RECTAL | Status: DC | PRN

## 2022-05-17 MED ORDER — LACTATED RINGERS IV SOLN: INTRAVENOUS | Status: DC

## 2022-05-17 MED ORDER — OXYCODONE HCL 5 MG PO TABS
5.0000 mg | ORAL_TABLET | ORAL | Status: DC | PRN
  Administered 2022-05-18 – 2022-05-23 (×13): 5 mg via ORAL
  Filled 2022-05-17 (×13): qty 1

## 2022-05-17 MED ORDER — ONDANSETRON HCL 4 MG/2ML IJ SOLN
4.0000 mg | Freq: Once | INTRAMUSCULAR | Status: AC
  Administered 2022-05-17: 4 mg via INTRAVENOUS
  Filled 2022-05-17: qty 2

## 2022-05-17 MED ORDER — ACETAMINOPHEN 325 MG PO TABS: 650.0000 mg | ORAL_TABLET | Freq: Four times a day (QID) | ORAL | Status: DC | PRN

## 2022-05-17 MED ORDER — SODIUM CHLORIDE 0.9 % IV BOLUS
1000.0000 mL | Freq: Once | INTRAVENOUS | Status: AC
  Administered 2022-05-17: 1000 mL via INTRAVENOUS

## 2022-05-17 MED ORDER — POTASSIUM CHLORIDE 10 MEQ/100ML IV SOLN
10.0000 meq | INTRAVENOUS | Status: DC
  Administered 2022-05-17: 10 meq via INTRAVENOUS
  Filled 2022-05-17: qty 100

## 2022-05-17 MED ORDER — LACTATED RINGERS IV BOLUS
1000.0000 mL | Freq: Once | INTRAVENOUS | Status: AC
  Administered 2022-05-17: 1000 mL via INTRAVENOUS

## 2022-05-17 MED ORDER — DEXTROSE IN LACTATED RINGERS 5 % IV SOLN: INTRAVENOUS | Status: DC

## 2022-05-17 MED ORDER — ONDANSETRON HCL 4 MG/2ML IJ SOLN: 4.0000 mg | Freq: Four times a day (QID) | INTRAMUSCULAR | Status: DC | PRN

## 2022-05-17 MED ORDER — ONDANSETRON 4 MG PO TBDP
4.0000 mg | ORAL_TABLET | Freq: Once | ORAL | Status: AC
  Administered 2022-05-17: 4 mg via ORAL
  Filled 2022-05-17: qty 1

## 2022-05-17 MED ORDER — DEXTROSE 50 % IV SOLN: 0.0000 mL | INTRAVENOUS | Status: DC | PRN

## 2022-05-17 MED ORDER — HYDRALAZINE HCL 20 MG/ML IJ SOLN: 5.0000 mg | Freq: Four times a day (QID) | INTRAMUSCULAR | Status: DC | PRN

## 2022-05-17 MED ORDER — MORPHINE SULFATE (PF) 4 MG/ML IV SOLN
4.0000 mg | Freq: Once | INTRAVENOUS | Status: AC
  Administered 2022-05-17: 4 mg via INTRAVENOUS
  Filled 2022-05-17: qty 1

## 2022-05-17 MED ORDER — INSULIN ASPART 100 UNIT/ML IJ SOLN
0.0000 [IU] | INTRAMUSCULAR | Status: DC
  Administered 2022-05-17: 11 [IU] via SUBCUTANEOUS
  Administered 2022-05-18: 2 [IU] via SUBCUTANEOUS
  Administered 2022-05-18: 3 [IU] via SUBCUTANEOUS
  Administered 2022-05-18: 5 [IU] via SUBCUTANEOUS
  Administered 2022-05-18 (×2): 3 [IU] via SUBCUTANEOUS
  Administered 2022-05-19: 2 [IU] via SUBCUTANEOUS
  Administered 2022-05-19 (×2): 3 [IU] via SUBCUTANEOUS
  Administered 2022-05-19: 5 [IU] via SUBCUTANEOUS
  Administered 2022-05-20: 2 [IU] via SUBCUTANEOUS
  Administered 2022-05-20: 3 [IU] via SUBCUTANEOUS
  Filled 2022-05-17: qty 0.15

## 2022-05-17 NOTE — ED Triage Notes (Signed)
Pt here with LUQ abdominal pain along with emesis and diarrhea X3 days. Pt with hx pancreatitis.

## 2022-05-17 NOTE — ED Notes (Signed)
PA Henderly aware of Hgb.

## 2022-05-17 NOTE — Assessment & Plan Note (Signed)
This is likely from dehydration in the setting of pancreatitis and DKA.

## 2022-05-17 NOTE — Assessment & Plan Note (Addendum)
Baseline creatinine of 1.  Creatinine of 3.2 on admission with peak of 3.95.  Improved with IV fluids

## 2022-05-17 NOTE — Hospital Course (Addendum)
51 y.o. 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    Patient now presents with several days of nausea vomiting so [name] emergency department on 11/12.  Upon evaluation patient was found to have acute pancreatitis with pseudocyst formation.  Presentation complicated by acute kidney injury as well as substantial hyperglycemia.  Possible prescription was then called to assess the patient for admission to the hospital.    Patient was initially managed with a regimen of intravenous empiric cefepime and Flagylwhich has since been discontinued.  Acute pancreatitis further evaluated by MRCP showing recurrent CBD stone.  Gastroenterology was consulted.  With plans to undergo repeat ERCP.  Acute kidney injury successfully managed with intravenous volume resuscitation.  Patient's substantial hyperglycemia was further evaluated revealing hemoglobin A1c of 6.3% and spontaneously downtrending hyperglycemia suggesting the patient was simply suffering from hyperglycemia secondary to acute pancreatitis in the setting of prediabetes and diabetes mellitus.  Patient eventually underwent ERCP with Dr. Rush Landmark on 11/17 revealing an edematous major papilla, a slightly narrowed prior biliary sphincterotomy with fluoroscopic examination suspicious for sludge with distal narrowing noted in the duct.  A balloon sphincteroplasty was performed with sludge in the biliary tree being swept out successfully.  There was an additional irregularity found in the ventral pancreatic duct in the head of the pancreas but evaluation of this was unsuccessful.  Gastroenterology recommended advancing diet as tolerated, observing for post ERCP complications and a repeat CT pancreas protocol in 3 to 4 weeks.  Days that followed patient's abdominal pain  substantially improved to baseline levels.  Patient's diet was advanced successfully.  Patient was discharged in improved and stable condition on 05/24/2022 and advised to closely follow-up with [facility] gastroenterology and was additionally provided with an additional 2 days of oral ciprofloxacin at time of discharge.

## 2022-05-17 NOTE — Assessment & Plan Note (Addendum)
MRCP performed 11/14 once again demonstrates acute pancreatitis with dominant 4.7 cm pseudocyst Possible CBD stone versus debris GI following Gentle intravenous hydration Continue as needed opiate-based analgesics Associated leukocytosis has resolved Empiric antibiotic therapy discontinued with negative cultures with normal procalcitonin

## 2022-05-17 NOTE — H&P (Addendum)
History and Physical    Patient: Steve Romero ZGY:[id number] DOB: 1971-02-14 DOA: 05/17/2022 DOS: the patient was seen and examined on 05/17/2022 PCP: [name], DO  Patient coming from: Home  Chief Complaint:  Chief Complaint  Patient presents with   Emesis       HPI:  Mr. Swopes is a 51 y.o. M with hx depression, smoking, HTN, and cholecystectomy in 2022 c/b bile leak then severe post-ERCP necrotizing pancreatitis and development of pseudocyst requiring cystogastrostomy and and biliary stent due to obstruction who was in his normal health over the last year until the last few days, developed nausea, vomiting, and was unable to keep anything down.  In the ER, Cr 3.2 from baseline 1.0, glucose 348 (no prior history diabetes) and anion gap elevated.  Hgb and Hct markedly elevated.        Review of Systems  Constitutional:  Negative for chills, fever and malaise/fatigue.  Respiratory:  Negative for cough and sputum production.   Cardiovascular:  Negative for chest pain.  Gastrointestinal:  Positive for abdominal pain, nausea and vomiting. Negative for blood in stool, constipation, diarrhea and melena.  Genitourinary:  Negative for dysuria and urgency.  All other systems reviewed and are negative.    Past Medical History:  Diagnosis Date   Acute pancreatitis without necrosis or infection, unspecified    AKI (acute kidney injury) ([place]) 05/17/2022   Anxiety 10/2014   Ascites    Bipolar disorder [facility]) age 43   COPD (chronic obstructive pulmonary disease) ([place]) 2016   Depression age 67   Enlarged prostate    Hyperlipidemia 2016   Hypertension 2013   Schizoaffective disorder ([place]) 11/13/2014   Sleep apnea    Does not wear c-pap, sleeps in sitting up position per pt   Thyroid disease 11/2014   Past Surgical History:  Procedure Laterality Date   BALLOON DILATION N/A 11/07/2020   Procedure: BALLOON DILATION;  Surgeon: Irving Copas., MD;  Location: [facility]  ENDOSCOPY;  Service: Gastroenterology;  Laterality: N/A;   BALLOON DILATION N/A 06/26/2021   Procedure: BALLOON DILATION;  Surgeon: Rush Landmark Telford Nab., MD;  Location: [place];  Service: Gastroenterology;  Laterality: N/A;   BILIARY DILATION  07/24/2021   Procedure: BILIARY DILATION;  Surgeon: Rush Landmark Telford Nab., MD;  Location: [place];  Service: Gastroenterology;;   BILIARY STENT PLACEMENT N/A 10/10/2020   Procedure: BILIARY STENT PLACEMENT;  Surgeon: Rogene Houston, MD;  Location: [facility];  Service: Endoscopy;  Laterality: N/A;   BILIARY STENT PLACEMENT  11/09/2020   Procedure: BILIARY STENT PLACEMENT;  Surgeon: Rush Landmark Telford Nab., MD;  Location: [place];  Service: Gastroenterology;;   BILIARY STENT PLACEMENT  11/07/2020   Procedure: BILIARY STENT PLACEMENT;  Surgeon: Irving Copas., MD;  Location: [place];  Service: Gastroenterology;;   BILIARY STENT PLACEMENT  11/11/2020   Procedure: BILIARY STENT PLACEMENT;  Surgeon: Irving Copas., MD;  Location: [place];  Service: Gastroenterology;;   BILIARY STENT PLACEMENT  11/14/2020   Procedure: BILIARY STENT PLACEMENT;  Surgeon: Irving Copas., MD;  Location: [place];  Service: Gastroenterology;;   BILIARY STENT PLACEMENT N/A 01/29/2021   Procedure: BILIARY STENT PLACEMENT;  Surgeon: Irving Copas., MD;  Location[facility];  Service: Gastroenterology;  Laterality: N/A;   BILIARY STENT PLACEMENT  06/26/2021   Procedure: BILIARY STENT PLACEMENT;  Surgeon: Rush Landmark Telford Nab., MD;  Location: [place];  Service: Gastroenterology;;   BILIARY STENT PLACEMENT  07/24/2021   Procedure: BILIARY STENT PLACEMENT;  Surgeon: Irving Copas., MD;  Location: [place];  Service: Gastroenterology;;   BIOPSY  11/07/2020   Procedure: BIOPSY;  Surgeon: Irving Copas., MD;  Location: [place];  Service: Gastroenterology;;   BIOPSY  12/20/2020   Procedure:  BIOPSY;  Surgeon: Irving Copas., MD;  Location: [facility];  Service: Gastroenterology;;   BIOPSY  06/26/2021   Procedure: BIOPSY;  Surgeon: Irving Copas., MD;  Location: [place];  Service: Gastroenterology;;   CARPAL TUNNEL RELEASE Left 02/21/2016   Procedure: CARPAL TUNNEL RELEASE;  Surgeon: Carole Civil, MD;  Location: [facility];  Service: Orthopedics;  Laterality: Left;   CHOLECYSTECTOMY N/A 10/09/2020   Procedure: LAPAROSCOPIC CHOLECYSTECTOMY;  Surgeon: Virl Cagey, MD;  Location: [facility];  Service: General;  Laterality: N/A;   CYST ENTEROSTOMY N/A 11/07/2020   Procedure: CYST ENTEROSTOMY;  Surgeon: Irving Copas., MD;  Location: [place];  Service: Gastroenterology;  Laterality: N/A;   CYST GASTROSTOMY  11/11/2020   Procedure: CYST NECROSECTOMY;  Surgeon: Rush Landmark Telford Nab., MD;  Location: [place];  Service: Gastroenterology;;   CYST GASTROSTOMY  11/14/2020   Procedure: CYST NECROSECTOMY;  Surgeon: Irving Copas., MD;  Location: [place];  Service: Gastroenterology;;   CYST REMOVAL HAND     CYSTOSCOPY  06/26/2021   Procedure: CYSTOGASTROSTOMY;  Surgeon: Mansouraty, Telford Nab., MD;  Location: [facility] ENDOSCOPY;  Service: Gastroenterology;;   ENDOSCOPIC RETROGRADE CHOLANGIOPANCREATOGRAPHY (ERCP) WITH PROPOFOL N/A 11/09/2020   Procedure: ENDOSCOPIC RETROGRADE CHOLANGIOPANCREATOGRAPHY (ERCP) WITH PROPOFOL;  Surgeon: Irving Copas., MD;  Location: [place];  Service: Gastroenterology;  Laterality: N/A;   ENDOSCOPIC RETROGRADE CHOLANGIOPANCREATOGRAPHY (ERCP) WITH PROPOFOL N/A 12/20/2020   Procedure: ENDOSCOPIC RETROGRADE CHOLANGIOPANCREATOGRAPHY (ERCP) WITH PROPOFOL;  Surgeon: Rush Landmark Telford Nab., MD;  Location: [facility];  Service: Gastroenterology;  Laterality: N/A;   ENDOSCOPIC RETROGRADE CHOLANGIOPANCREATOGRAPHY (ERCP) WITH PROPOFOL N/A 01/29/2021   Procedure: ENDOSCOPIC RETROGRADE  CHOLANGIOPANCREATOGRAPHY (ERCP) WITH PROPOFOL;  Surgeon: Rush Landmark Telford Nab., MD;  Location: [facility];  Service: Gastroenterology;  Laterality: N/A;   ENDOSCOPIC RETROGRADE CHOLANGIOPANCREATOGRAPHY (ERCP) WITH PROPOFOL N/A 07/24/2021   Procedure: ENDOSCOPIC RETROGRADE CHOLANGIOPANCREATOGRAPHY (ERCP) WITH PROPOFOL;  Surgeon: Rush Landmark Telford Nab., MD;  Location: [place];  Service: Gastroenterology;  Laterality: N/A;   ERCP N/A 10/10/2020   Procedure: ENDOSCOPIC RETROGRADE CHOLANGIOPANCREATOGRAPHY (ERCP);  Surgeon: Rogene Houston, MD;  Location: [facility];  Service: Endoscopy;  Laterality: N/A;   ERCP     ESOPHAGOGASTRODUODENOSCOPY N/A 11/11/2020   Procedure: ESOPHAGOGASTRODUODENOSCOPY (EGD);  Surgeon: Irving Copas., MD;  Location: [place];  Service: Gastroenterology;  Laterality: N/A;   ESOPHAGOGASTRODUODENOSCOPY N/A 11/14/2020   Procedure: ESOPHAGOGASTRODUODENOSCOPY (EGD);  Surgeon: Irving Copas., MD;  Location: [place];  Service: Gastroenterology;  Laterality: N/A;   ESOPHAGOGASTRODUODENOSCOPY (EGD) WITH PROPOFOL N/A 11/09/2020   Procedure: ESOPHAGOGASTRODUODENOSCOPY (EGD) WITH PROPOFOL;  Surgeon: Rush Landmark Telford Nab., MD;  Location: [place];  Service: Gastroenterology;  Laterality: N/A;   ESOPHAGOGASTRODUODENOSCOPY (EGD) WITH PROPOFOL N/A 11/07/2020   Procedure: ESOPHAGOGASTRODUODENOSCOPY (EGD) WITH PROPOFOL;  Surgeon: Rush Landmark Telford Nab., MD;  Location: [place];  Service: Gastroenterology;  Laterality: N/A;   ESOPHAGOGASTRODUODENOSCOPY (EGD) WITH PROPOFOL N/A 12/20/2020   Procedure: ESOPHAGOGASTRODUODENOSCOPY (EGD) WITH PROPOFOL;  Surgeon: Rush Landmark Telford Nab., MD;  Location[facility];  Service: Gastroenterology;  Laterality: N/A;   ESOPHAGOGASTRODUODENOSCOPY (EGD) WITH PROPOFOL N/A 06/26/2021   Procedure: ESOPHAGOGASTRODUODENOSCOPY (EGD) WITH PROPOFOL;  Surgeon: Rush Landmark Telford Nab., MD;  Location: [place];  Service:  Gastroenterology;  Laterality: N/A;   ESOPHAGOGASTRODUODENOSCOPY (EGD) WITH PROPOFOL N/A 07/24/2021   Procedure:  ESOPHAGOGASTRODUODENOSCOPY (EGD) WITH PROPOFOL;  Surgeon: Mansouraty, Telford Nab., MD;  Location: [place];  Service: Gastroenterology;  Laterality: N/A;  AXIOS STENT   EUS  11/14/2020   Procedure: UPPER ENDOSCOPIC ULTRASOUND (EUS) LINEAR;  Surgeon: Irving Copas., MD;  Location: [place];  Service: Gastroenterology;;   EUS N/A 06/26/2021   Procedure: UPPER ENDOSCOPIC ULTRASOUND (EUS) RADIAL;  Surgeon: Irving Copas., MD;  Location: [place];  Service: Gastroenterology;  Laterality: N/A;   GASTROINTESTINAL STENT REMOVAL  12/20/2020   Procedure: GASTROINTESTINAL STENT REMOVAL;  Surgeon: Irving Copas., MD;  Location[facility];  Service: Gastroenterology;;  cyst gastrostomy stent and double pig tail stents x2 removed   HERNIA REPAIR Left 2002   groin   INGUINAL HERNIA REPAIR Left 04/05/2017   Procedure: RECURRENT HERNIA REPAIR INGUINAL ADULT WITH MESH;  Surgeon: [name], MD;  Location: [facility];  Service: General;  Laterality: Left;   MASS EXCISION Right 04/29/2020   Procedure: EXCISION MASS RIGHT WRIST;  Surgeon: [name], MD;  Location: [place];  Service: Orthopedics;  Laterality: Right;   MASS EXCISION Right 10/09/2020   Procedure: EXCISION MASS, ABDOMINAL WALL, 2CM;  Surgeon: Virl Cagey, MD;  Location: [facility];  Service: General;  Laterality: Right;   PANCREATIC STENT PLACEMENT  06/26/2021   Procedure: AXIOS STENT PLACEMENT;  Surgeon: Irving Copas., MD;  Location: [place];  Service: Gastroenterology;;   REMOVAL OF STONES  12/20/2020   Procedure: REMOVAL OF STONES;  Surgeon: Irving Copas., MD;  Location[facility];  Service: Gastroenterology;;   REMOVAL OF STONES  07/24/2021   Procedure: REMOVAL OF STONES;  Surgeon: Irving Copas., MD;  Location: [place];  Service:  Gastroenterology;;   Joan Mayans N/A 10/10/2020   Procedure: Joan Mayans;  Surgeon: Rogene Houston, MD;  Location: [facility];  Service: Endoscopy;  Laterality: N/A;   SPHINCTEROTOMY  07/24/2021   Procedure: SPHINCTEROTOMY;  Surgeon: Mansouraty, Telford Nab., MD;  Location: [place];  Service: Gastroenterology;;   Bess Kinds CHOLANGIOSCOPY N/A 12/20/2020   Procedure: [id number] CHOLANGIOSCOPY;  Surgeon: Irving Copas., MD;  [facility];  Service: Gastroenterology;  Laterality: N/A;   SPYGLASS CHOLANGIOSCOPY N/A 07/24/2021   Procedure: SPYGLASS CHOLANGIOSCOPY;  Surgeon: Irving Copas., MD;  Location: [place];  Service: Gastroenterology;  Laterality: N/A;   STENT REMOVAL  11/09/2020   Procedure: STENT REMOVAL;  Surgeon: Irving Copas., MD;  Location: [place];  Service: Gastroenterology;;   Lavell Islam REMOVAL  11/11/2020   Procedure: STENT REMOVAL;  Surgeon: Irving Copas., MD;  Location: [place];  Service: Gastroenterology;;   Lavell Islam REMOVAL  11/14/2020   Procedure: STENT REMOVAL;  Surgeon: Irving Copas., MD;  Location: [place];  Service: Gastroenterology;;   Lavell Islam REMOVAL  12/20/2020   Procedure: STENT REMOVAL;  Surgeon: Irving Copas., MD;  Location[facility];  Service: Gastroenterology;;  biliary x2   STENT REMOVAL  07/24/2021   Procedure: AXIOS STENT REMOVAL;  Surgeon: Irving Copas., MD;  Location: [place];  Service: Gastroenterology;;   UPPER ESOPHAGEAL ENDOSCOPIC ULTRASOUND (EUS) N/A 11/07/2020   Procedure: UPPER ESOPHAGEAL ENDOSCOPIC ULTRASOUND (EUS);  Surgeon: Irving Copas., MD;  Location: [place];  Service: Gastroenterology;  Laterality: N/A;   UPPER GASTROINTESTINAL ENDOSCOPY     WOUND DEBRIDEMENT  11/09/2020   Procedure: CYST NECROSECTOMY;  Surgeon: Rush Landmark Telford Nab., MD;  Location: [place];  Service: Gastroenterology;;   Social History: Lives alone.  Smokes a  few cigarettes a day.  No  alcohol.  Allergies  Allergen Reactions   Desipramine Nausea Only and Rash    Family History  Problem Relation Age of Onset   Diabetes Father    Heart disease Father    Heart disease Maternal Grandmother    Stroke Maternal Grandfather    Colon cancer Neg Hx    Esophageal cancer Neg Hx    Inflammatory bowel disease Neg Hx    Liver disease Neg Hx    Pancreatic cancer Neg Hx    Rectal cancer Neg Hx    Stomach cancer Neg Hx     Prior to Admission medications   Medication Sig Start Date End Date Taking? Authorizing Provider  lipase/protease/amylase (CREON) 36000 UNITS CPEP capsule 2 capsules with each meal and 1-2 capsules with each snack (up too 2 snacks 04/14/22   Mansouraty, Telford Nab., MD    Physical Exam: Vitals:   05/17/22 1144 05/17/22 1755  BP: (!) 164/129   Pulse: (!) 123   Resp: 18   Temp: 97.7 F (36.5 C) (!) 97.4 F (36.3 C)  TempSrc: Oral Oral  SpO2: 99%      General: Healthy, alert and in no distress.  Responds appropriately to questions.  Appears tired.  Eye contact, dress and hygiene appropriate. HEENT: Corneas clear, conjunctivae and sclerae normal without injection or icterus, lids and lashes normal.  Visual tracking smooth.  Oropharynx very dry, no oral lesions, dentition in poor repair, lips normal.  No airway deformities.  Neck supple.   Cardiac: Tachycardic, regular, nl S1-S2, no murmurs, rubs, gallops.  Capillary refill is less than 2 seconds.   Respiratory: Normal respiratory rate and rhythm.  CTAB without rales or wheezes. Abdomen: BS present.  Voluntary guarding in all quadrants, no rebound or rigidity, no masses or organomegaly appreciated, no dilated veins or rashes, no ascites or distention   Extremities: No deformities/injuries.  5/5 grip strength and upper extremity flexion/extension, symmetrically.  Extremities are warm and well-perfused. Neuro: Sensorium intact.  Speech is fluent.  Naming is grossly intact, and the  patient's recall, recent and remote, as well as general fund of knowledge seem within normal limits.  Muscle tone diminished, without fasciculations.  Moves all extremities equally but with generalized weakness .  Attention span and concentration are within normal limits.  Psych: Mood " okay", blunted affect.  Normal rate and rhythm of speech.  Thought content appropriate, and thought process linear.  No SI/HI, aural or visual hallucinations or delusions. Attention and concentration are normal.     Data Reviewed: Discussed with ER physician, Dr. Tamera Punt. Basic metabolic panel shows renal failure, hyperglycemia, elevated anion gap Lipase elevated LFTs and total bilirubin normal, alk phos slightly elevated White blood cell count and hematocrit elevated CT of the abdomen and pelvis shows acute pancreatitis with increased size of pseudocyst      Assessment and Plan: * AKI (acute kidney injury) ([place]) Suspect this is ischemic injury due to hypovolemia from DKA.  Baseline 1.0, currently tripled at 3.2.  UA bland, no cells. - Continue IV fluids - Check urine lytes - If not improving with fluids, check US renal    Acute pancreatitis - NPO - IVF - Analgesics and antiemetics - Gastroenterology has been consulted     Erythrocytosis This is likely from dehydration in the setting of pancreatitis and DKA.  Type 2 diabetes mellitus with hyperglycemia Presents with new glucose >300, elevated anion gap but BHOB normal, initially concerned for DKA, doubt this.   - Subcutaensou insulin via SS  scale - Diabetes educator - Obtain HgbA1c   Exocrine pancreatic insufficiency - Resume Creon when able to take p.o.  Obesity BMI 30.6  HTN (hypertension) Has a history of hypertension, not on meds at home, blood pressure here elevated - For now as needed hydralazine         Advance Care Planning: Full code, presumed  Consults: Gastroenterology, [place], secure chat sent  Family  Communication: None present  Severity of Illness: The appropriate patient status for this patient is INPATIENT. Inpatient status is judged to be reasonable and necessary in order to provide the required intensity of service to ensure the patient's safety. The patient's presenting symptoms, physical exam findings, and initial radiographic and laboratory data in the context of their chronic comorbidities is felt to place them at high risk for further clinical deterioration. Furthermore, it is not anticipated that the patient will be medically stable for discharge from the hospital within 2 midnights of admission.   * I certify that at the point of admission it is my clinical judgment that the patient will require inpatient hospital care spanning beyond 2 midnights from the point of admission due to high intensity of service, high risk for further deterioration and high frequency of surveillance required.*  Author: Edwin Dada, MD 05/17/2022 7:53 PM  For on call review [email or web address].

## 2022-05-17 NOTE — Assessment & Plan Note (Addendum)
initial blood sugars well over 300 on admission with glycemic control improved  Hemoglobin A1C of 6.3%  Continue SSI

## 2022-05-17 NOTE — ED Provider Triage Note (Signed)
Emergency Medicine Provider Triage Evaluation Note  Steve Romero , a 51 y.o. male  was evaluated in triage.  Pt complains of abd pain, emesis. Hx of pancreatitis, feels similar. NBNB emesis. Loose stool without melena or BRBPR. No etoh, tylenol. Followed by  GI.  Review of Systems  Positive: Abd pain, emesis, diarrhea Negative: fever  Physical Exam  BP (!) 151/111   Pulse (!) 108   Temp 97.6 F (36.4 C)   Resp 16   SpO2 97%  Gen:   Awake, no distress   Resp:  Normal effort  MSK:   Moves extremities without difficulty  ABD:  Tenderness to epigastric and LUQ Other:    Medical Decision Making  Medically screening exam initiated at 8:22 AM.  Appropriate orders placed.  Steve Romero was informed that the remainder of the evaluation will be completed by another provider, this initial triage assessment does not replace that evaluation, and the importance of remaining in the ED until their evaluation is complete.  Abd pain, emesis   Steve Romero A, PA-C 05/17/22 [id number]

## 2022-05-17 NOTE — Assessment & Plan Note (Signed)
Has a history of hypertension, not on meds at home, blood pressure here elevated - For now as needed hydralazine

## 2022-05-17 NOTE — ED Provider Notes (Addendum)
[place] DEPT Provider Note   CSN: [id number] Arrival date & time: 05/17/22  1048     History  Chief Complaint  Patient presents with   Emesis    Steve Romero is a 51 y.o. male.  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  He has developed a pseudocyst.  He presents today with a 3-day history of abdominal pain associated with nausea and vomiting.  Also had some loose stools.  He states he has not really been able to keep anything down in the last 3 days.  No fevers.  He has some pain across his upper abdomen, more on the left side which is typical for his pancreatitis flareups.  No hematemesis.  No urinary symptoms.       Home Medications Prior to Admission medications   Medication Sig Start Date End Date Taking? Authorizing Provider  lipase/protease/amylase (CREON) 36000 UNITS CPEP capsule 2 capsules with each meal and 1-2 capsules with each snack (up too 2 snacks 04/14/22   Mansouraty, Telford Nab., MD      Allergies    Desipramine    Review of Systems   Review of Systems  Constitutional:  Positive for fatigue. Negative for chills, diaphoresis and fever.  HENT:  Negative for congestion, rhinorrhea and sneezing.   Eyes: Negative.   Respiratory:  Negative for cough, chest tightness and shortness of breath.   Cardiovascular:  Negative for chest pain and leg swelling.  Gastrointestinal:  Positive for abdominal pain, nausea and vomiting. Negative for blood in stool and diarrhea.  Genitourinary:  Negative for difficulty urinating, flank pain, frequency and hematuria.  Musculoskeletal:  Negative for arthralgias and back pain.  Skin:  Negative for rash.  Neurological:  Negative for dizziness, speech difficulty, weakness, numbness and headaches.    Physical Exam Updated Vital  Signs BP (!) 164/129 (BP Location: Left Arm)   Pulse (!) 123   Temp (!) 97.4 F (36.3 C) (Oral)   Resp 18   SpO2 99%  Physical Exam Constitutional:      Appearance: He is well-developed.  HENT:     Head: Normocephalic and atraumatic.  Eyes:     Pupils: Pupils are equal, round, and reactive to light.  Cardiovascular:     Rate and Rhythm: Regular rhythm. Tachycardia present.     Heart sounds: Normal heart sounds.  Pulmonary:     Effort: Pulmonary effort is normal. No respiratory distress.     Breath sounds: Normal breath sounds. No wheezing or rales.  Chest:     Chest wall: No tenderness.  Abdominal:     General: Bowel sounds are normal.     Palpations: Abdomen is soft.     Tenderness: There is abdominal tenderness (Tenderness to the upper abdomen, no rebound or guarding). There is no guarding or rebound.  Musculoskeletal:        General: Normal range of motion.     Cervical back: Normal range of motion and neck supple.  Lymphadenopathy:     Cervical: No cervical adenopathy.  Skin:    General: Skin is warm and dry.     Findings: No rash.  Neurological:     Mental Status: He is alert and oriented to person, place, and time.     ED Results / Procedures / Treatments   Labs (all labs ordered are listed, but only abnormal results are displayed) Labs Reviewed  I-STAT CHEM 8, ED - Abnormal; Notable for the following components:      Result Value   Sodium 134 (*)    Chloride 95 (*)    BUN 45 (*)    Creatinine, Ser 3.80 (*)    Glucose, Bld 389 (*)    Calcium, Ion 0.94 (*)    Hemoglobin 18.4 (*)    HCT 54.0 (*)    All other components within normal limits  CBG MONITORING, ED - Abnormal; Notable for the following components:   Glucose-Capillary 419 (*)    All other components within normal limits  CULTURE, BLOOD (ROUTINE X 2)  CULTURE, BLOOD (ROUTINE X 2)    EKG None  Radiology CT ABDOMEN PELVIS WO CONTRAST  Result Date: 05/17/2022 CLINICAL DATA:  Nausea and  vomiting.  Pancreatitis. EXAM: CT ABDOMEN AND PELVIS WITHOUT CONTRAST TECHNIQUE: Multidetector CT imaging of the abdomen and pelvis was performed following the standard protocol without IV contrast. RADIATION DOSE REDUCTION: This exam was performed according to the departmental dose-optimization program which includes automated exposure control, adjustment of the mA and/or kV according to patient size and/or use of iterative reconstruction technique. COMPARISON:  09/25/2021 and 04/21/2022. FINDINGS: Lower chest: No acute abnormality. Hepatobiliary: Increase low-attenuation within the posterior aspect of the lateral segment of left hepatic lobe and medial segment of left hepatic lobe compared with 04/21/2022. This is favored to represent an area of increased focal fatty deposition, image 20/2. Status post cholecystectomy. Similar appearance of mild central intrahepatic bile duct dilatation. Pancreas: Peripancreatic soft tissue stranding is again identified compatible with acute pancreatitis. Previously noted pseudocyst centered around the neck of pancreas appears increased in size from previous exam measuring 4.0 x 4.0 cm, image 28/2. On the exam from 04/21/22 this measured 1.9 x 1.6 cm. Spleen: Normal in size without focal abnormality. Adrenals/Urinary Tract: Normal adrenal glands. No nephrolithiasis, hydronephrosis or mass. Urinary bladder appears decompressed. Stomach/Bowel: Pancreatic pseudo cyst extends into the lesser sac and has mass effect upon the distal stomach. Stomach appears nondistended. No significant bowel wall thickening, inflammation, or distension. The appendix is visualized and appears normal. Vascular/Lymphatic: Normal appearance of the abdominal aorta. No signs of abdominopelvic adenopathy. Reproductive: Prostate is unremarkable. Other: Small fat containing umbilical hernia and small fat containing right inguinal hernia. No abdominopelvic ascites. Musculoskeletal: No acute or suspicious osseous  findings. 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 Moderate L3-4 degenerative disc disease IMPRESSION: 1. Acute pancreatitis. 2. Interval increase in size of pseudocyst centered around the neck of pancreas. 3. Increase low-attenuation within the posterior aspect of the lateral segment of left hepatic lobe and medial segment of left hepatic lobe compared with 04/21/2022. This is favored to represent an area of increased focal fatty deposition. 4. 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 5. Small fat containing umbilical hernia and small fat containing right inguinal hernia. Electronically Signed   By: Kerby Moors M.D.   On: 05/17/2022 13:06    Procedures Procedures    Medications Ordered in ED Medications  sodium chloride 0.9 % bolus 1,000 mL (1,000 mLs Intravenous New Bag/Given 05/17/22 1743)  morphine (PF) 4 MG/ML injection 4 mg (4 mg Intravenous Given 05/17/22 1743)  ondansetron (ZOFRAN) injection 4 mg (4 mg Intravenous Given 05/17/22 1743)    ED Course/ Medical Decision Making/ A&P  Medical Decision Making Risk Prescription drug management. Decision regarding hospitalization.   Patient is a 51 year old male who presents with nausea vomiting associated with acute flareup of his pancreatitis.  He has a known history of pancreatitis and per chart review is followed by Dr. Rush Landmark with [facility].  He is noted to be tachycardic on arrival and hypertensive.  He has not been able to take his blood pressure medications.  He initially was at [facility] this morning and left to come here.  Labs are reviewed from this morning.  He appears to be very hemoconcentrated with an elevated WBC count and markedly elevated hematocrit.  I feel these values are likely elevated from his hemoconcentration/dehydration.  He does not have a fever or other infectious symptoms.  He also was noted to be in acute  kidney failure with a creatinine of 3.2.  His baseline is normal.  His potassium is normal.  His glucose is elevated as well.  There is not a known history of diabetes.  CT scan was performed which shows evidence of acute pancreatitis.  There is also an enlarging pseudocyst.  Patient was started on IV fluids.  He was given antiemetics and opioid pain medication for symptomatic treatment.  We will plan admission to the hospitalist.  Discussed with team.  He will admit the patient.  I also notified Dr. Rush Landmark with gastroenterology who will advise that day team tomorrow to consult on the patient.  CRITICAL CARE Performed by: Malvin Johns Total critical care time: 60 minutes Critical care time was exclusive of separately billable procedures and treating other patients. Critical care was necessary to treat or prevent imminent or life-threatening deterioration. Critical care was time spent personally by me on the following activities: development of treatment plan with patient and/or surrogate as well as nursing, discussions with consultants, evaluation of patient's response to treatment, examination of patient, obtaining history from patient or surrogate, ordering and performing treatments and interventions, ordering and review of laboratory studies, ordering and review of radiographic studies, pulse oximetry and re-evaluation of patient's condition.   Final Clinical Impression(s) / ED Diagnoses Final diagnoses:  Acute pancreatitis without infection or necrosis, unspecified pancreatitis type  Pseudocyst, pancreas  AKI (acute kidney injury) ([place])  Hyperglycemia    Rx / DC Orders ED Discharge Orders     None         Malvin Johns, MD 05/17/22 7846    Malvin Johns, MD 05/17/22 1818

## 2022-05-17 NOTE — Assessment & Plan Note (Signed)
-  Continue Creon °

## 2022-05-17 NOTE — ED Provider Triage Note (Signed)
Emergency Medicine Provider Triage Evaluation Note  Steve Romero , a 51 y.o. male  was evaluated in triage.  Pt complains of abdominal pain.  Review of Systems  Positive: Abdominal pain, nausea, vomiting Negative: Chest pain  Physical Exam  BP (!) 164/129 (BP Location: Left Arm)   Pulse (!) 123   Temp 97.7 F (36.5 C) (Oral)   Resp 18   SpO2 99%  Gen:   Awake, uncomfortable due to abdominal pain Resp:  Normal effort  MSK:   Moves extremities without difficulty  Other:   Epigastric and left upper quadrant tenderness  Medical Decision Making  Medically screening exam initiated at 12:23 PM.  Appropriate orders placed.  [name] was informed that the remainder of the evaluation will be completed by another provider, this initial triage assessment does not replace that evaluation, and the importance of remaining in the ED until their evaluation is complete.  Patient advised that he will need admission to the hospital.  He was seen at [name], he eloped from there.  He has labs indicating acute kidney injury.  I will order CT abdomen pelvis without contrast.  Patient and family member made aware that he will need admission to the hospital, but we request patient's, as there are no beds available at this point.  I have paged GI.  They said clear liquid diet after reviewing the CT abdomen pelvis without contrast that shows worsening of the pseudocyst.   Varney Biles, MD 05/18/22 [phone]

## 2022-05-17 NOTE — ED Triage Notes (Signed)
Patient said he has been vomiting for a few days now. History of pancreatitis.

## 2022-05-17 NOTE — Assessment & Plan Note (Signed)
Patient does not meet criteria for obesity. Estimated body mass index is 29.84 kg/m as calculated from the following:   Height as of this encounter: '5\' 10"'$  (1.778 m).   Weight as of this encounter: 94.3 kg.

## 2022-05-18 DIAGNOSIS — E1165 Type 2 diabetes mellitus with hyperglycemia: Secondary | ICD-10-CM

## 2022-05-18 DIAGNOSIS — K863 Pseudocyst of pancreas: Secondary | ICD-10-CM | POA: Diagnosis not present

## 2022-05-18 DIAGNOSIS — N179 Acute kidney failure, unspecified: Secondary | ICD-10-CM | POA: Diagnosis not present

## 2022-05-18 DIAGNOSIS — K8681 Exocrine pancreatic insufficiency: Secondary | ICD-10-CM | POA: Diagnosis not present

## 2022-05-18 DIAGNOSIS — K858 Other acute pancreatitis without necrosis or infection: Secondary | ICD-10-CM | POA: Diagnosis not present

## 2022-05-18 DIAGNOSIS — K8500 Idiopathic acute pancreatitis without necrosis or infection: Secondary | ICD-10-CM

## 2022-05-18 DIAGNOSIS — D751 Secondary polycythemia: Secondary | ICD-10-CM | POA: Diagnosis not present

## 2022-05-18 DIAGNOSIS — I10 Essential (primary) hypertension: Secondary | ICD-10-CM

## 2022-05-18 LAB — PROCALCITONIN: Procalcitonin: 0.1 ng/mL

## 2022-05-18 LAB — PROTEIN / CREATININE RATIO, URINE
Creatinine, Urine: 345 mg/dL
Protein Creatinine Ratio: 0.15 mg/mg{Cre} (ref 0.00–0.15)
Total Protein, Urine: 51 mg/dL

## 2022-05-18 LAB — COMPREHENSIVE METABOLIC PANEL
ALT: 28 U/L (ref 0–44)
AST: 16 U/L (ref 15–41)
Albumin: 4.1 g/dL (ref 3.5–5.0)
Alkaline Phosphatase: 172 U/L — ABNORMAL HIGH (ref 38–126)
Anion gap: 15 (ref 5–15)
BUN: 54 mg/dL — ABNORMAL HIGH (ref 6–20)
CO2: 28 mmol/L (ref 22–32)
Calcium: 8.5 mg/dL — ABNORMAL LOW (ref 8.9–10.3)
Chloride: 97 mmol/L — ABNORMAL LOW (ref 98–111)
Creatinine, Ser: 2.47 mg/dL — ABNORMAL HIGH (ref 0.61–1.24)
GFR, Estimated: 31 mL/min — ABNORMAL LOW (ref >60)
Glucose, Bld: 126 mg/dL — ABNORMAL HIGH (ref 70–99)
Potassium: 3.5 mmol/L (ref 3.5–5.1)
Sodium: 140 mmol/L (ref 135–145)
Total Bilirubin: 1.1 mg/dL (ref 0.3–1.2)
Total Protein: 8.1 g/dL (ref 6.5–8.1)

## 2022-05-18 LAB — CBC
HCT: 51.9 % (ref 39.0–52.0)
Hemoglobin: 18.4 g/dL — ABNORMAL HIGH (ref 13.0–17.0)
MCH: 31.3 pg (ref 26.0–34.0)
MCHC: 35.5 g/dL (ref 30.0–36.0)
MCV: 88.3 fL (ref 80.0–100.0)
Platelets: 327 10*3/uL (ref 150–400)
RBC: 5.88 MIL/uL — ABNORMAL HIGH (ref 4.22–5.81)
RDW: 12.6 % (ref 11.5–15.5)
WBC: 22.8 10*3/uL — ABNORMAL HIGH (ref 4.0–10.5)
nRBC: 0 % (ref 0.0–0.2)

## 2022-05-18 LAB — GLUCOSE, CAPILLARY
Glucose-Capillary: 117 mg/dL — ABNORMAL HIGH (ref 70–99)
Glucose-Capillary: 193 mg/dL — ABNORMAL HIGH (ref 70–99)

## 2022-05-18 LAB — SODIUM, URINE, RANDOM: Sodium, Ur: 16 mmol/L

## 2022-05-18 LAB — HIV ANTIBODY (ROUTINE TESTING W REFLEX): HIV Screen 4th Generation wRfx: NONREACTIVE

## 2022-05-18 LAB — CBG MONITORING, ED
Glucose-Capillary: 144 mg/dL — ABNORMAL HIGH (ref 70–99)
Glucose-Capillary: 160 mg/dL — ABNORMAL HIGH (ref 70–99)
Glucose-Capillary: 165 mg/dL — ABNORMAL HIGH (ref 70–99)
Glucose-Capillary: 217 mg/dL — ABNORMAL HIGH (ref 70–99)

## 2022-05-18 LAB — CREATININE, URINE, RANDOM: Creatinine, Urine: 347 mg/dL

## 2022-05-18 LAB — SEDIMENTATION RATE: Sed Rate: 6 mm/hr (ref 0–16)

## 2022-05-18 LAB — BILIRUBIN, TOTAL: Total Bilirubin: 1 mg/dL (ref 0.3–1.2)

## 2022-05-18 LAB — LIPASE, BLOOD: Lipase: 343 U/L — ABNORMAL HIGH (ref 11–51)

## 2022-05-18 LAB — C-REACTIVE PROTEIN: CRP: 2.9 mg/dL — ABNORMAL HIGH (ref <1.0)

## 2022-05-18 MED ORDER — PANCRELIPASE (LIP-PROT-AMYL) 12000-38000 UNITS PO CPEP
24000.0000 [IU] | ORAL_CAPSULE | Freq: Three times a day (TID) | ORAL | Status: DC | PRN
  Filled 2022-05-18 (×2): qty 2

## 2022-05-18 MED ORDER — ENOXAPARIN SODIUM 30 MG/0.3ML IJ SOSY
30.0000 mg | PREFILLED_SYRINGE | INTRAMUSCULAR | Status: DC
  Administered 2022-05-18: 30 mg via SUBCUTANEOUS
  Filled 2022-05-18: qty 0.3

## 2022-05-18 MED ORDER — LIVING WELL WITH DIABETES BOOK
Freq: Once | Status: AC
  Filled 2022-05-18: qty 1

## 2022-05-18 MED ORDER — LACTATED RINGERS IV SOLN: INTRAVENOUS | Status: DC

## 2022-05-18 MED ORDER — SODIUM CHLORIDE 0.9 % IV SOLN
2.0000 g | Freq: Two times a day (BID) | INTRAVENOUS | Status: DC
  Administered 2022-05-18: 2 g via INTRAVENOUS
  Filled 2022-05-18: qty 12.5

## 2022-05-18 MED ORDER — PANTOPRAZOLE SODIUM 40 MG IV SOLR
40.0000 mg | INTRAVENOUS | Status: DC
  Administered 2022-05-18 – 2022-05-23 (×6): 40 mg via INTRAVENOUS
  Filled 2022-05-18 (×6): qty 10

## 2022-05-18 MED ORDER — METRONIDAZOLE 500 MG/100ML IV SOLN
500.0000 mg | Freq: Two times a day (BID) | INTRAVENOUS | Status: DC
  Administered 2022-05-18 – 2022-05-19 (×2): 500 mg via INTRAVENOUS
  Filled 2022-05-18 (×2): qty 100

## 2022-05-18 NOTE — Inpatient Diabetes Management (Signed)
Inpatient Diabetes Program Recommendations  AACE/ADA: New Consensus Statement on Inpatient Glycemic Control (2015)  Target Ranges:  Prepandial:   less than 140 mg/dL      Peak postprandial:   less than 180 mg/dL (1-2 hours)      Critically ill patients:  140 - 180 mg/dL   Lab Results  Component Value Date   GLUCAP 160 (H) 05/18/2022   HGBA1C 6.3 (H) 05/17/2022    Review of Glycemic Control  Latest Reference Range & Units 05/17/22 17:48 05/17/22 18:56 05/17/22 20:13 05/18/22 00:08 05/18/22 04:28 05/18/22 08:07  Glucose-Capillary 70 - 99 mg/dL 419 (H) 327 (H) 330 (H) 165 (H) 144 (H) 160 (H)  (H): Data is abnormally high Diabetes history: New onset DM ? Outpatient Diabetes medications: none Current orders for Inpatient glycemic control: Novolog 0-15 units Q4H  Inpatient Diabetes Program Recommendations:    Consider adding Levemir 8 units QD.   Spoke with patient regarding new onset DM per consult. Patient to be admitted for acute pancreatitis with history of necrotizing pancreatitis.  Reviewed patient's current A1c of 6.3%. Explained what a A1c is and what it measures. Also reviewed goal A1c with patient, importance of good glucose control @ home, and blood sugar goals. Reviewed patho of DM, ADA guidelines for diagnosis of DM, signs and symptoms of DM, target goals, when to call MD, impact of increased blood sugars, vascular changes and commorbidities.  Patient will need a meter at discharge. Meter kit [id number]. Encouraged to begin learning how to check CBGs, reviewed recommended frequency and when to call MD. Also, reviewed role of outpatient endocrinology. Will attach to DC summary.  Patient admits to drinking Mountain dew. Reviewed alternatives, how this impacts beta cells, and encouraged mindfulness. Patient willing ot make changes.  Discussed potential for insulin vs inflammation of pancreas. Will follow while inpatient for insulin needs.  Will order [facility] booklet and dietitian.    Thanks, Bronson Curb, MSN, RNC-OB Diabetes Coordinator [phone] (8a-5p)

## 2022-05-18 NOTE — Progress Notes (Signed)
PHARMACY NOTE -  Cefepime  Pharmacy has been assisting with dosing of cefepime for intra-abdominal infection. Dosage remains stable at 2g IV q12 hr and further renal adjustments per institutional Pharmacy antibiotic protocol  Pharmacy will sign off, following peripherally for culture results or dose adjustments. Please reconsult if a change in clinical status warrants re-evaluation of dosage.  Reuel Boom, PharmD, BCPS [phone] 05/18/2022, 5:23 PM

## 2022-05-18 NOTE — Consult Note (Addendum)
Consultation  Referring Provider:  [facility] [place] Primary Care Physician:  [name], DO Primary Gastroenterologist:  Dr Rush Landmark  Reason for Consultation:  N/V, abdominal pain  HPI: Steve Romero is a 51 y.o. male, well-known to Dr. Rush Landmark with history of severe necrotizing pancreatitis with complicated course over about a year requiring numerous procedures, with ERCPs, biliary stent placements, stent removal etc.  Initial episode of pancreatitis occurred after an episode of biliary pancreatitis, cholecystectomy was complicated by bile leak and subsequent severe post ERCP necrotizing pancreatitis. Patient developed chronic abdominal pain after the initial episode.  His last procedure was done in January 2023/ERCP per Dr. Rush Landmark, at that time there was a large amount of residual food in the stomach, previously placed axial dose and double-pigtail cystogastrostomy stents were removed, prior biliary sphincter myotomy appeared open but prior metal stent had migrated into the duct, biliary tree was swept and sludge removed, extended biliary sphincterotomy was done, 1 plastic biliary stent was placed into the common bile duct through the biliary stent that had migrated to ensure biliary patency.  Patient had been seen last in the office about a month ago at that time had been doing well, not requiring any medications for abdominal pain, had gained weight and not having any issues with nausea or vomiting.  He did have fecal elastase done which was low and had been started on Creon for pancreatic insufficiency.  He then had CT of the abdomen pelvis on 04/21/2022 for follow-up per pancreas protocol that did show some fat stranding around the head and the neck of the pancreas and a new fluid collection 1.9 x 1.8 cm, no ductal dilation, status postcholecystectomy and mild biliary ductal dilation unchanged. Plan was for follow-up and follow-up imaging in about 3 months.  He was  subsequently referred to [facility] and underwent repeat ERCP with removal of the plastic biliary stent, and the previously placed metal stent.  There is noted to be a mild irregularity in the ventral pancreatic duct in the head of the pancreas, deep wire access into the ventral pancreatic duct was unable to be achieved pancreatic duct in the head measured 3.5 mm.  Patient presented to the emergency room yesterday with persistent nausea and vomiting and inability to keep down p.o.'s.  He said he had been unable to keep down anything including water for 2 to 3 days, prior to that time over the past week and a half had developed recurrent abdominal pain which she said had not been severe but similar to his previous pancreas pain, nausea vomiting and also had a couple of days of what he describes as "bad" diarrhea unaware of any melena or hematochezia.  No known fever or chills.  Repeat imaging was done yesterday which shows peripancreatic soft tissue stranding consistent with acute pancreatitis and the previously noted pseudocyst about the neck of the pancreas had increased in size up to 4 cm, also commented on low-attenuation area within the left hepatic lobe most consistent with focal fatty deposition.  Labs showed lipase of 343 WBC 18.8/hemoglobin 21/hematocrit 58.3 Glucose 348 BUN 37/creatinine 3.2 LFTs normal other than alk phos of 233.  Alk phos elevation has been chronic  He had been given a couple of 1 L boluses WBC 22.8/hemoglobin 18.4 today Glucose 126 BUN 54/creatinine 2.47  He feels a bit better since arrival to the emergency room he has not had any  vomiting but still remains nauseated, and says that the diarrhea stopped prior to coming  to the hospital     Past Medical History:  Diagnosis Date   Acute pancreatitis without necrosis or infection, unspecified    AKI (acute kidney injury) ([place]) 05/17/2022   Anxiety 10/2014   Ascites    Bipolar disorder [facility]) age 69   COPD (chronic  obstructive pulmonary disease) ([place]) 2016   Depression age 50   Enlarged prostate    Hyperlipidemia 2016   Hypertension 2013   Schizoaffective disorder ([place]) 11/13/2014   Sleep apnea    Does not wear c-pap, sleeps in sitting up position per pt   Thyroid disease 11/2014    Past Surgical History:  Procedure Laterality Date   BALLOON DILATION N/A 11/07/2020   Procedure: BALLOON DILATION;  Surgeon: Irving Copas., MD;  Location: [facility] ENDOSCOPY;  Service: Gastroenterology;  Laterality: N/A;   BALLOON DILATION N/A 06/26/2021   Procedure: BALLOON DILATION;  Surgeon: Rush Landmark Telford Nab., MD;  Location: [place];  Service: Gastroenterology;  Laterality: N/A;   BILIARY DILATION  07/24/2021   Procedure: BILIARY DILATION;  Surgeon: Rush Landmark Telford Nab., MD;  Location: [place];  Service: Gastroenterology;;   BILIARY STENT PLACEMENT N/A 10/10/2020   Procedure: BILIARY STENT PLACEMENT;  Surgeon: Rogene Houston, MD;  [facility];  Service: Endoscopy;  Laterality: N/A;   BILIARY STENT PLACEMENT  11/09/2020   Procedure: BILIARY STENT PLACEMENT;  Surgeon: Rush Landmark Telford Nab., MD;  Location: [place];  Service: Gastroenterology;;   BILIARY STENT PLACEMENT  11/07/2020   Procedure: BILIARY STENT PLACEMENT;  Surgeon: Irving Copas., MD;  Location: [place];  Service: Gastroenterology;;   BILIARY STENT PLACEMENT  11/11/2020   Procedure: BILIARY STENT PLACEMENT;  Surgeon: Irving Copas., MD;  Location: [place];  Service: Gastroenterology;;   BILIARY STENT PLACEMENT  11/14/2020   Procedure: BILIARY STENT PLACEMENT;  Surgeon: Irving Copas., MD;  Location: [place];  Service: Gastroenterology;;   BILIARY STENT PLACEMENT N/A 01/29/2021   Procedure: BILIARY STENT PLACEMENT;  Surgeon: Irving Copas., MD;  Location: [facility];  Service: Gastroenterology;  Laterality: N/A;   BILIARY STENT PLACEMENT  06/26/2021   Procedure:  BILIARY STENT PLACEMENT;  Surgeon: Rush Landmark Telford Nab., MD;  Location: [place];  Service: Gastroenterology;;   BILIARY STENT PLACEMENT  07/24/2021   Procedure: BILIARY STENT PLACEMENT;  Surgeon: Irving Copas., MD;  Location: [place];  Service: Gastroenterology;;   BIOPSY  11/07/2020   Procedure: BIOPSY;  Surgeon: Irving Copas., MD;  Location: [place];  Service: Gastroenterology;;   BIOPSY  12/20/2020   Procedure: BIOPSY;  Surgeon: Irving Copas., MD;  Location: [facility];  Service: Gastroenterology;;   BIOPSY  06/26/2021   Procedure: BIOPSY;  Surgeon: Irving Copas., MD;  Location: [place];  Service: Gastroenterology;;   CARPAL TUNNEL RELEASE Left 02/21/2016   Procedure: CARPAL TUNNEL RELEASE;  Surgeon: Carole Civil, MD;  [facility];  Service: Orthopedics;  Laterality: Left;   CHOLECYSTECTOMY N/A 10/09/2020   Procedure: LAPAROSCOPIC CHOLECYSTECTOMY;  Surgeon: Virl Cagey, MD;  [facility];  Service: General;  Laterality: N/A;   CYST ENTEROSTOMY N/A 11/07/2020   Procedure: CYST ENTEROSTOMY;  Surgeon: Irving Copas., MD;  Location: [place];  Service: Gastroenterology;  Laterality: N/A;   CYST GASTROSTOMY  11/11/2020   Procedure: CYST NECROSECTOMY;  Surgeon: Rush Landmark Telford Nab., MD;  Location: [place];  Service: Gastroenterology;;   CYST GASTROSTOMY  11/14/2020   Procedure: CYST NECROSECTOMY;  Surgeon: Irving Copas., MD;  Location: [place];  Service: Gastroenterology;;   CYST REMOVAL HAND     CYSTOSCOPY  06/26/2021   Procedure: CYSTOGASTROSTOMY;  Surgeon: Mansouraty, Telford Nab., MD;  Location: [facility] ENDOSCOPY;  Service: Gastroenterology;;   ENDOSCOPIC RETROGRADE CHOLANGIOPANCREATOGRAPHY (ERCP) WITH PROPOFOL N/A 11/09/2020   Procedure: ENDOSCOPIC RETROGRADE CHOLANGIOPANCREATOGRAPHY (ERCP) WITH PROPOFOL;  Surgeon: Irving Copas., MD;  Location: [place];  Service:  Gastroenterology;  Laterality: N/A;   ENDOSCOPIC RETROGRADE CHOLANGIOPANCREATOGRAPHY (ERCP) WITH PROPOFOL N/A 12/20/2020   Procedure: ENDOSCOPIC RETROGRADE CHOLANGIOPANCREATOGRAPHY (ERCP) WITH PROPOFOL;  Surgeon: Rush Landmark Telford Nab., MD;  Location: [facility];  Service: Gastroenterology;  Laterality: N/A;   ENDOSCOPIC RETROGRADE CHOLANGIOPANCREATOGRAPHY (ERCP) WITH PROPOFOL N/A 01/29/2021   Procedure: ENDOSCOPIC RETROGRADE CHOLANGIOPANCREATOGRAPHY (ERCP) WITH PROPOFOL;  Surgeon: Rush Landmark Telford Nab., MD;  Location: [facility]OS[facility];  Service: Gastroenterology;  Laterality: N/A;   ENDOSCOPIC RETROGRADE CHOLANGIOPANCREATOGRAPHY (ERCP) WITH PROPOFOL N/A 07/24/2021   Procedure: ENDOSCOPIC RETROGRADE CHOLANGIOPANCREATOGRAPHY (ERCP) WITH PROPOFOL;  Surgeon: Rush Landmark Telford Nab., MD;  Location: [place];  Service: Gastroenterology;  Laterality: N/A;   ERCP N/A 10/10/2020   Procedure: ENDOSCOPIC RETROGRADE CHOLANGIOPANCREATOGRAPHY (ERCP);  Surgeon: Rogene Houston, MD;  [facility];  Service: Endoscopy;  Laterality: N/A;   ERCP     ESOPHAGOGASTRODUODENOSCOPY N/A 11/11/2020   Procedure: ESOPHAGOGASTRODUODENOSCOPY (EGD);  Surgeon: Irving Copas., MD;  Location: [place];  Service: Gastroenterology;  Laterality: N/A;   ESOPHAGOGASTRODUODENOSCOPY N/A 11/14/2020   Procedure: ESOPHAGOGASTRODUODENOSCOPY (EGD);  Surgeon: Irving Copas., MD;  Location: [place];  Service: Gastroenterology;  Laterality: N/A;   ESOPHAGOGASTRODUODENOSCOPY (EGD) WITH PROPOFOL N/A 11/09/2020   Procedure: ESOPHAGOGASTRODUODENOSCOPY (EGD) WITH PROPOFOL;  Surgeon: Rush Landmark Telford Nab., MD;  Location: [place];  Service: Gastroenterology;  Laterality: N/A;   ESOPHAGOGASTRODUODENOSCOPY (EGD) WITH PROPOFOL N/A 11/07/2020   Procedure: ESOPHAGOGASTRODUODENOSCOPY (EGD) WITH PROPOFOL;  Surgeon: Rush Landmark Telford Nab., MD;  Location: [place];  Service: Gastroenterology;  Laterality: N/A;    ESOPHAGOGASTRODUODENOSCOPY (EGD) WITH PROPOFOL N/A 12/20/2020   Procedure: ESOPHAGOGASTRODUODENOSCOPY (EGD) WITH PROPOFOL;  Surgeon: Rush Landmark Telford Nab., MD;  [facility];  Service: Gastroenterology;  Laterality: N/A;   ESOPHAGOGASTRODUODENOSCOPY (EGD) WITH PROPOFOL N/A 06/26/2021   Procedure: ESOPHAGOGASTRODUODENOSCOPY (EGD) WITH PROPOFOL;  Surgeon: Rush Landmark Telford Nab., MD;  Location: [place];  Service: Gastroenterology;  Laterality: N/A;   ESOPHAGOGASTRODUODENOSCOPY (EGD) WITH PROPOFOL N/A 07/24/2021   Procedure: ESOPHAGOGASTRODUODENOSCOPY (EGD) WITH PROPOFOL;  Surgeon: Rush Landmark Telford Nab., MD;  Location: [place];  Service: Gastroenterology;  Laterality: N/A;  AXIOS STENT   EUS  11/14/2020   Procedure: UPPER ENDOSCOPIC ULTRASOUND (EUS) LINEAR;  Surgeon: Irving Copas., MD;  Location: [place];  Service: Gastroenterology;;   EUS N/A 06/26/2021   Procedure: UPPER ENDOSCOPIC ULTRASOUND (EUS) RADIAL;  Surgeon: Irving Copas., MD;  Location: [place];  Service: Gastroenterology;  Laterality: N/A;   GASTROINTESTINAL STENT REMOVAL  12/20/2020   Procedure: GASTROINTESTINAL STENT REMOVAL;  Surgeon: Irving Copas., MD;  [facility];  Service: Gastroenterology;;  cyst gastrostomy stent and double pig tail stents x2 removed   HERNIA REPAIR Left 2002   groin   INGUINAL HERNIA REPAIR Left 04/05/2017   Procedure: RECURRENT HERNIA REPAIR INGUINAL ADULT WITH MESH;  Surgeon: [name], MD;  [facility];  Service: General;  Laterality: Left;   MASS EXCISION Right 04/29/2020   Procedure: EXCISION MASS RIGHT WRIST;  Surgeon: [name], MD;  Location: [place];  Service: Orthopedics;  Laterality: Right;   MASS EXCISION Right 10/09/2020   Procedure: EXCISION MASS, ABDOMINAL WALL, 2CM;  Surgeon: Virl Cagey, MD;  [facility];  Service: General;  Laterality: Right;   PANCREATIC STENT PLACEMENT   06/26/2021   Procedure: AXIOS STENT PLACEMENT;  Surgeon: Irving Copas., MD;  Location: [place];  Service: Gastroenterology;;   REMOVAL OF STONES  12/20/2020   Procedure: REMOVAL OF STONES;  Surgeon: Irving Copas., MD;  Location[facility];  Service: Gastroenterology;;   REMOVAL OF STONES  07/24/2021   Procedure: REMOVAL OF STONES;  Surgeon: Irving Copas., MD;  Location: [place];  Service: Gastroenterology;;   Joan Mayans N/A 10/10/2020   Procedure: Joan Mayans;  Surgeon: Rogene Houston, MD;  Location: [facility];  Service: Endoscopy;  Laterality: N/A;   SPHINCTEROTOMY  07/24/2021   Procedure: SPHINCTEROTOMY;  Surgeon: Mansouraty, Telford Nab., MD;  Location: [place];  Service: Gastroenterology;;   Bess Kinds CHOLANGIOSCOPY N/A 12/20/2020   Procedure: OMVEHMCN CHOLANGIOSCOPY;  Surgeon: Irving Copas., MD;  [facility];  Service: Gastroenterology;  Laterality: N/A;   SPYGLASS CHOLANGIOSCOPY N/A 07/24/2021   Procedure: SPYGLASS CHOLANGIOSCOPY;  Surgeon: Irving Copas., MD;  Location: [place];  Service: Gastroenterology;  Laterality: N/A;   STENT REMOVAL  11/09/2020   Procedure: STENT REMOVAL;  Surgeon: Irving Copas., MD;  Location: [place];  Service: Gastroenterology;;   Lavell Islam REMOVAL  11/11/2020   Procedure: STENT REMOVAL;  Surgeon: Irving Copas., MD;  Location: [place];  Service: Gastroenterology;;   Lavell Islam REMOVAL  11/14/2020   Procedure: STENT REMOVAL;  Surgeon: Irving Copas., MD;  Location: [place];  Service: Gastroenterology;;   Lavell Islam REMOVAL  12/20/2020   Procedure: STENT REMOVAL;  Surgeon: Irving Copas., MD;  Location[facility];  Service: Gastroenterology;;  biliary x2   STENT REMOVAL  07/24/2021   Procedure: AXIOS STENT REMOVAL;  Surgeon: Irving Copas., MD;  Location: [place];  Service: Gastroenterology;;   UPPER ESOPHAGEAL ENDOSCOPIC  ULTRASOUND (EUS) N/A 11/07/2020   Procedure: UPPER ESOPHAGEAL ENDOSCOPIC ULTRASOUND (EUS);  Surgeon: Irving Copas., MD;  Location: [place];  Service: Gastroenterology;  Laterality: N/A;   UPPER GASTROINTESTINAL ENDOSCOPY     WOUND DEBRIDEMENT  11/09/2020   Procedure: CYST NECROSECTOMY;  Surgeon: Rush Landmark Telford Nab., MD;  Location: [place];  Service: Gastroenterology;;    Prior to Admission medications   Medication Sig Start Date End Date Taking? Authorizing Provider  lipase/protease/amylase (CREON) 36000 UNITS CPEP capsule 2 capsules with each meal and 1-2 capsules with each snack (up too 2 snacks Patient taking differently: Take 36,000 Units by mouth See admin instructions. 2 capsules with each meal and 1-2 capsules with each snack (up too 2 snacks 04/14/22  Yes Mansouraty, Telford Nab., MD    Current Facility-Administered Medications  Medication Dose Route Frequency Provider Last Rate Last Admin   acetaminophen (TYLENOL) tablet 650 mg  650 mg Oral Q6H PRN Danford, Suann Larry, MD       Or   acetaminophen (TYLENOL) suppository 650 mg  650 mg Rectal Q6H PRN Danford, Suann Larry, MD       enoxaparin (LOVENOX) injection 30 mg  30 mg Subcutaneous Q24H Danford, Suann Larry, MD       hydrALAZINE (APRESOLINE) injection 5 mg  5 mg Intravenous Q6H PRN Danford, Suann Larry, MD       insulin aspart (novoLOG) injection 0-15 Units  0-15 Units Subcutaneous Q4H Edwin Dada, MD   3 Units at 05/18/22 0819   lipase/protease/amylase (CREON) capsule 72,000 Units  72,000 Units Oral TID WC Edwin Dada, MD   72,000 Units at 05/18/22  0820   morphine (PF) 2 MG/ML injection 2 mg  2 mg Intravenous Q3H PRN Edwin Dada, MD   2 mg at 05/18/22 0822   ondansetron (ZOFRAN) injection 4 mg  4 mg Intravenous Q6H PRN Danford, Suann Larry, MD       oxyCODONE (Oxy IR/ROXICODONE) immediate release tablet 5 mg  5 mg Oral Q4H PRN Edwin Dada, MD   5 mg at  05/18/22 0017   Current Outpatient Medications  Medication Sig Dispense Refill   lipase/protease/amylase (CREON) 36000 UNITS CPEP capsule 2 capsules with each meal and 1-2 capsules with each snack (up too 2 snacks (Patient taking differently: Take 36,000 Units by mouth See admin instructions. 2 capsules with each meal and 1-2 capsules with each snack (up too 2 snacks) 540 capsule 11    Allergies as of 05/17/2022 - Review Complete 05/17/2022  Allergen Reaction Noted   Desipramine Nausea Only and Rash 08/24/2012    Family History  Problem Relation Age of Onset   Diabetes Father    Heart disease Father    Heart disease Maternal Grandmother    Stroke Maternal Grandfather    Colon cancer Neg Hx    Esophageal cancer Neg Hx    Inflammatory bowel disease Neg Hx    Liver disease Neg Hx    Pancreatic cancer Neg Hx    Rectal cancer Neg Hx    Stomach cancer Neg Hx     Social History   Socioeconomic History   Marital status: Single    Spouse name: Not on file   Number of children: Not on file   Years of education: Not on file   Highest education level: Not on file  Occupational History   Not on file  Tobacco Use   Smoking status: Every Day    Packs/day: 0.25    Years: 26.00    Total pack years: 6.50    Types: Cigarettes   Smokeless tobacco: Never   Tobacco comments:    4-5 cig daily as of 10/07/2020.  Vaping Use   Vaping Use: Never used  Substance and Sexual Activity   Alcohol use: No   Drug use: No   Sexual activity: Never    Birth control/protection: None  Other Topics Concern   Not on file  Social History Narrative   Not on file   Social Determinants of Health   Financial Resource Strain: Not on file  Food Insecurity: Not on file  Transportation Needs: Not on file  Physical Activity: Not on file  Stress: Not on file  Social Connections: Not on file  Intimate Partner Violence: Not on file    Review of Systems: Pertinent positive and negative review of systems  were noted in the above HPI section.  All other review of systems was otherwise negative.   Physical Exam: Vital signs in last 24 hours: Temp:  [97.4 F (36.3 C)-98 F (36.7 C)] 97.4 F (36.3 C) (11/13 0457) Pulse Rate:  [83-123] 83 (11/13 0745) Resp:  [14-18] 17 (11/13 0745) BP: (136-175)/(99-129) 171/109 (11/13 0745) SpO2:  [91 %-100 %] 92 % (11/13 0745) Weight:  [96 kg] 96 kg (11/12 2210)   General:   Alert, well-developed, well-nourished, pleasant and cooperative in NAD Head:  Normocephalic and atraumatic. Eyes:  Sclera clear, no icterus.   Conjunctiva pink. Ears:  Normal auditory acuity. Nose:  No deformity, discharge,  or lesions. Mouth:  No deformity or lesions.   Neck:  Supple; no masses or thyromegaly. Lungs:  Clear throughout to auscultation.   No wheezes, crackles, or rhonchi.  Heart:  Regular rate and rhythm; no murmurs, clicks, rubs,  or gallops. Abdomen:  Soft, he is tender across the upper abdomen more so in LUQ, no rebound, no significant distention. BS active, nonpalp mass or hsm.   Rectal: Not done today Msk:  Symmetrical without gross deformities. . Pulses:  Normal pulses noted. Extremities:  Without clubbing or edema. Neurologic:  Alert and  oriented x4;  grossly normal neurologically. Skin:  Intact without significant lesions or rashes.. Psych:  Alert and cooperative. Normal mood and affect.  Intake/Output from previous day: 11/12 0701 - 11/13 0700 In: 1449.7 [I.V.:348.6; IV Piggyback:1101.1] Out: -  Intake/Output this shift: Total I/O In: -  Out: 250 [Urine:250]  Lab Results: Recent Labs    05/17/22 0835 05/17/22 1801 05/18/22 0433  WBC 18.8*  --  22.8*  HGB 21.6* 18.4* 18.4*  HCT 58.3* 54.0* 51.9  PLT 386  --  327   BMET Recent Labs    05/17/22 0835 05/17/22 1801 05/18/22 0433  NA 135 135  134* 140  K 3.5 3.6  3.7 3.5  CL 92* 93*  95* 97*  CO2 21* 21* 28  GLUCOSE 348* 371*  389* 126*  BUN 37* 50*  45* 54*  CREATININE 3.20*  3.95*  3.80* 2.47*  CALCIUM 9.9 9.0 8.5*   LFT Recent Labs    05/18/22 0433 05/18/22 0657  PROT 8.1  --   ALBUMIN 4.1  --   AST 16  --   ALT 28  --   ALKPHOS 172*  --   BILITOT 1.1 1.0   PT/INR No results for input(s): "LABPROT", "INR" in the last 72 hours. Hepatitis Panel No results for input(s): "HEPBSAG", "HCVAB", "HEPAIGM", "HEPBIGM" in the last 72 hours.   IMPRESSION:   #66 51 year old white male with 1-1/2-week history of nausea and vomiting progressing to intractable nausea vomiting and at least a couple of days of diarrhea.  Relates about 1 week history of abdominal pain which has not been severe This is in the setting of previous history of severe complicated necrotizing pancreatitis initial onset after cholecystectomy for biliary pancreatitis complicated by bile leak and then severe post ERCP pancreatitis 2022 Patient required numerous procedures, pancreatic cyst gastrostomy, biliary stenting and stent removals over the course of multiple months.  At last ERCP done at [facility] February 2023 this included a pancreatic duct injection noting mild irregularity in the ventral pancreatic duct in the head of the pancreas, unable to access the ventral pancreatic duct.  Patient had done well for several months but was noted to have mild peripancreatic stranding and recurrence of a small pseudocyst in the neck of the pancreas measuring 1.9 cm as of October 2023. Now with above symptoms over the past couple of weeks and CT imaging showing active though not severe pancreatitis, no evidence for pancreatic necrosis but has enlarged pancreatic pseudocyst now measuring 4 cm  Etiology of the recurrent pancreatitis and recurrent pseudocyst not entirely clear though need to consider pancreatic duct disruption  #2 new diagnosis of adult onset diabetes mellitus this admission  #3  acute kidney injury secondary to above #4  bipolar disorder, schizoaffective disorder #5  Hypertension #6   History of adenomatous colon polyps-up-to-date with colonoscopy #7  Sleep apnea #8  Pancreatic insufficiency-on enzyme replacement at home   PLAN: Clear liquids as tolerated IV PPI once daily Zofran 4 mg every 6 hours as needed Management of diabetes  as per hospitalist Needs continued IV fluid hydration with AKI-defer to hospitalist He will need eventual MRCP later this admission, will discuss further with Dr. Rush Landmark - depending on how he does over the next few days and whether or not he is able to tolerate p.o.'s could consider aspiration of the pseudocyst.  GI will follow with you   Amy EsterwoodPA-C  05/18/2022, 9:02 AM   Attending Physician Note   I have taken a history, reviewed the chart and examined the patient. I performed a substantive portion of this encounter, including complete performance of at least one of the key components, in conjunction with the APP. I agree with the APP's note, impression and recommendations with my edits. My additional impressions and recommendations are as follows.   Acute pancreatitis, enlarging pseudocyst. History of complicated necrotizing pancreatitis secondary to gallstone / post ERCP pancreatitis further complicated by biliary obstruction and recurrent pseudocysts. R/O PD stricture. IVF, pain and nausea control, MRI/MRCP when symptoms improve and then consider pseudocyst aspiration vs follow up [facility] to monitor his enlarging pseudocyst. Will discuss with Dr. Rush Landmark.   [name], MD [facility] See [facility], [facility], for our on call provider

## 2022-05-18 NOTE — Discharge Instructions (Addendum)
Please follow-up with your primary care provider in 1 to 2 weeks  Please take all prescribed medications exactly as instructed including your remaining doses of oral antibiotics. You will be contacted with a follow-up appoint with [place] [facility] [facility], please contact them if you do not hear from them within the next 1 week.  They will arrange for a repeat CT scan of your abdomen in the next 3 to 4 weeks. Please consume a low sodium diet Please avoid greasy food, spicy food or excessive alcohol or caffeine intake. You have been found to have prediabetes during this hospitalization.  Your blood sugars are slightly elevated but not elevated enough to go home on oral diabetes medications. Instead, please purchase the following at your nearest [employer]: ReliOn Glucometer (any model is fine, the least expensive does an excellent job) Relion glucose test strips (pick the strips that match the model of glucometer) Lancets Using the items purchased above check your blood sugar once daily prior to breakfast and record these blood sugars and a blood sugar diary.  Please bring this diary with you to see your primary care provider.  At that point they can decide whether medication is needed in the future.

## 2022-05-18 NOTE — Progress Notes (Signed)
PROGRESS NOTE    Steve Romero  WVP:[id number] DOB: 1971/05/16 DOA: 05/17/2022 PCP: [name], DO   Brief Narrative: Steve Romero is a 51 y.o. male with a history of depression, tobacco use, hypertension, cholecystectomy, post-ERCP necrotizing pancreatitis with pseudocyst. Patient presented secondary to nausea, vomiting abdominal pain and found to have evidence of pancreatitis with pseudocyst. GI consulted. NPO initially.   Assessment and Plan: * AKI (acute kidney injury) ([facility]) Baseline creatinine of 1. Creatinine of 3.2 on admission with peak of 3.95. IV fluids initiated. Creatinine improved to 2.47 this morning. -Continue IV fluids  Acute pancreatitis Complicated by pseudocyst without necrosis. Associated leukocytosis with unclear source. Blood cultures obtained and are pending (no growth to date). No antibiotics initiated on admission with worsened leukocytosis this morning -Continue IV fluids -Analgesics -Empiric Cefepime and Flagyl until culture data remains negative -GI recommendations: Clear liquid diet, conservative management and possible MRI/MRCP  Erythrocytosis Chronic with slight worsening on admission likely related to dehydration. Improved.  Type 2 diabetes mellitus with hyperglycemia Hemoglobin A1C not consistent with diabetes, but blood sugar >300 g/dL on admission. -Continue SSI  Exocrine pancreatic insufficiency -Continue Creon  Obesity Patient does not meet criteria for obesity. Estimated body mass index is 29.84 kg/m as calculated from the following:   Height as of this encounter: '5\' 10"'$  (1.778 m).   Weight as of this encounter: 94.3 kg.  HTN (hypertension) -Continue hydralazine PRN    DVT prophylaxis: Lovenox Code Status:   Code Status: Full Code Family Communication: None at bedside Disposition Plan: Discharge home likely in several days pending advancement of diet and GI recommendations   Consultants:  [facility]  Procedures:  None  Antimicrobials: Cefepime Flagyl    Subjective: Patient reports some improvement of pain. No emesis/nausea.  Objective: BP (!) 159/102 (BP Location: Left Arm)   Pulse 76   Temp (!) 97.4 F (36.3 C) (Oral)   Resp 18   Ht '5\' 10"'$  (1.778 m)   Wt 94.3 kg   SpO2 95%   BMI 29.84 kg/m   Examination:  General exam: Appears calm and comfortable Respiratory system: Clear to auscultation. Respiratory effort normal. Cardiovascular system: S1 & S2 heard, RRR. No murmurs, rubs, gallops or clicks. Gastrointestinal system: Abdomen is nondistended, soft and tender in left quadrant. Normal bowel sounds heard. Central nervous system: Alert and oriented. No focal neurological deficits. Musculoskeletal: No edema. No calf tenderness Skin: No cyanosis. No rashes Psychiatry: Judgement and insight appear normal. Mood & affect appropriate.    Data Reviewed: I have personally reviewed following labs and imaging studies  CBC Lab Results  Component Value Date   WBC 22.8 (H) 05/18/2022   RBC 5.88 (H) 05/18/2022   HGB 18.4 (H) 05/18/2022   HCT 51.9 05/18/2022   MCV 88.3 05/18/2022   MCH 31.3 05/18/2022   PLT 327 05/18/2022   MCHC 35.5 05/18/2022   RDW 12.6 05/18/2022   LYMPHSABS 1.9 05/17/2022   MONOABS 1.0 05/17/2022   EOSABS 0.0 05/17/2022   BASOSABS 0.1 54/62/7035     Last metabolic panel Lab Results  Component Value Date   NA 140 05/18/2022   K 3.5 05/18/2022   CL 97 (L) 05/18/2022   CO2 28 05/18/2022   BUN 54 (H) 05/18/2022   CREATININE 2.47 (H) 05/18/2022   GLUCOSE 126 (H) 05/18/2022   GFRNONAA 31 (L) 05/18/2022   GFRAA 85 10/04/2019   CALCIUM 8.5 (L) 05/18/2022   PHOS 4.1 09/04/2021   PROT 8.1 05/18/2022  ALBUMIN 4.1 05/18/2022   LABGLOB 2.3 07/09/2021   AGRATIO 1.9 07/09/2021   BILITOT 1.0 05/18/2022   ALKPHOS 172 (H) 05/18/2022   AST 16 05/18/2022   ALT 28 05/18/2022   ANIONGAP 15 05/18/2022    GFR: Estimated Creatinine  Clearance: 40.8 mL/min (A) (by C-G formula based on SCr of 2.47 mg/dL (H)).  Recent Results (from the past 240 hour(s))  Culture, blood (routine x 2)     Status: None (Preliminary result)   Collection Time: 05/17/22  6:30 PM   Specimen: BLOOD  Result Value Ref Range Status   Specimen Description   Final    BLOOD SITE NOT SPECIFIED Performed at [facility] [address], [place], [place] [address]    Special Requests   Final    BOTTLES DRAWN AEROBIC AND ANAEROBIC Blood Culture results may not be optimal due to an excessive volume of blood received in culture bottles Performed at [facility] [address] [place], [place] [address]    Culture   Final    NO GROWTH < 12 HOURS Performed at [facility] [address], [place], [place] [address]    Report Status PENDING  Incomplete  Culture, blood (routine x 2)     Status: None (Preliminary result)   Collection Time: 05/17/22 10:54 PM   Specimen: BLOOD  Result Value Ref Range Status   Specimen Description   Final    BLOOD BLOOD LEFT FOREARM Performed at [facility] [address], [place], [place] [address]    Special Requests   Final    BOTTLES DRAWN AEROBIC AND ANAEROBIC Blood Culture adequate volume Performed at [facility] [address], [place], [place] [address]    Culture   Final    NO GROWTH < 12 HOURS Performed at [facility] [address] [place], [place] [address]    Report Status PENDING  Incomplete      Radiology Studies: CT ABDOMEN PELVIS WO CONTRAST  Result Date: 05/17/2022 CLINICAL DATA:  Nausea and vomiting.  Pancreatitis. EXAM: CT ABDOMEN AND PELVIS WITHOUT CONTRAST TECHNIQUE: Multidetector CT imaging of the abdomen and pelvis was performed following the standard protocol without IV contrast. RADIATION DOSE REDUCTION: This exam was performed according to the departmental dose-optimization program which  includes automated exposure control, adjustment of the mA and/or kV according to patient size and/or use of iterative reconstruction technique. COMPARISON:  09/25/2021 and 04/21/2022. FINDINGS: Lower chest: No acute abnormality. Hepatobiliary: Increase low-attenuation within the posterior aspect of the lateral segment of left hepatic lobe and medial segment of left hepatic lobe compared with 04/21/2022. This is favored to represent an area of increased focal fatty deposition, image 20/2. Status post cholecystectomy. Similar appearance of mild central intrahepatic bile duct dilatation. Pancreas: Peripancreatic soft tissue stranding is again identified compatible with acute pancreatitis. Previously noted pseudocyst centered around the neck of pancreas appears increased in size from previous exam measuring 4.0 x 4.0 cm, image 28/2. On the exam from 04/21/22 this measured 1.9 x 1.6 cm. Spleen: Normal in size without focal abnormality. Adrenals/Urinary Tract: Normal adrenal glands. No nephrolithiasis, hydronephrosis or mass. Urinary bladder appears decompressed. Stomach/Bowel: Pancreatic pseudo cyst extends into the lesser sac and has mass effect upon the distal stomach. Stomach appears nondistended. No significant bowel wall thickening, inflammation, or distension. The appendix is visualized and appears normal. Vascular/Lymphatic: Normal appearance of the abdominal aorta. No signs of abdominopelvic adenopathy. Reproductive: Prostate is  unremarkable. Other: Small fat containing umbilical hernia and small fat containing right inguinal hernia. No abdominopelvic ascites. Musculoskeletal: No acute or suspicious osseous findings. 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 Moderate L3-4 degenerative disc disease IMPRESSION: 1. Acute pancreatitis. 2. Interval increase in size of pseudocyst centered around the neck of pancreas. 3. Increase low-attenuation within the posterior aspect of the lateral segment  of left hepatic lobe and medial segment of left hepatic lobe compared with 04/21/2022. This is favored to represent an area of increased focal fatty deposition. 4. 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 5. Small fat containing umbilical hernia and small fat containing right inguinal hernia. Electronically Signed   By: Kerby Moors M.D.   On: 05/17/2022 13:06      LOS: 1 day    Cordelia Poche, MD [facility] 05/18/2022, 4:59 PM   If 7PM-7AM, please contact night-coverage [email or web address]

## 2022-05-19 ENCOUNTER — Inpatient Hospital Stay (HOSPITAL_COMMUNITY): Payer: Medicaid Other

## 2022-05-19 DIAGNOSIS — N179 Acute kidney failure, unspecified: Secondary | ICD-10-CM | POA: Diagnosis not present

## 2022-05-19 DIAGNOSIS — K8681 Exocrine pancreatic insufficiency: Secondary | ICD-10-CM | POA: Diagnosis not present

## 2022-05-19 DIAGNOSIS — K863 Pseudocyst of pancreas: Secondary | ICD-10-CM | POA: Diagnosis present

## 2022-05-19 DIAGNOSIS — K858 Other acute pancreatitis without necrosis or infection: Secondary | ICD-10-CM | POA: Diagnosis not present

## 2022-05-19 DIAGNOSIS — I10 Essential (primary) hypertension: Secondary | ICD-10-CM | POA: Diagnosis not present

## 2022-05-19 DIAGNOSIS — K859 Acute pancreatitis without necrosis or infection, unspecified: Secondary | ICD-10-CM | POA: Diagnosis not present

## 2022-05-19 LAB — COMPREHENSIVE METABOLIC PANEL
ALT: 29 U/L (ref 0–44)
AST: 22 U/L (ref 15–41)
Albumin: 3.9 g/dL (ref 3.5–5.0)
Alkaline Phosphatase: 158 U/L — ABNORMAL HIGH (ref 38–126)
Anion gap: 10 (ref 5–15)
BUN: 34 mg/dL — ABNORMAL HIGH (ref 6–20)
CO2: 32 mmol/L (ref 22–32)
Calcium: 8.5 mg/dL — ABNORMAL LOW (ref 8.9–10.3)
Chloride: 91 mmol/L — ABNORMAL LOW (ref 98–111)
Creatinine, Ser: 1.21 mg/dL (ref 0.61–1.24)
GFR, Estimated: >60 mL/min (ref >60)
Glucose, Bld: 139 mg/dL — ABNORMAL HIGH (ref 70–99)
Potassium: 3.7 mmol/L (ref 3.5–5.1)
Sodium: 133 mmol/L — ABNORMAL LOW (ref 135–145)
Total Bilirubin: 1.6 mg/dL — ABNORMAL HIGH (ref 0.3–1.2)
Total Protein: 7.1 g/dL (ref 6.5–8.1)

## 2022-05-19 LAB — CBC WITH DIFFERENTIAL/PLATELET
Abs Immature Granulocytes: 0.11 10*3/uL — ABNORMAL HIGH (ref 0.00–0.07)
Basophils Absolute: 0.1 10*3/uL (ref 0.0–0.1)
Basophils Relative: 1 %
Eosinophils Absolute: 0.2 10*3/uL (ref 0.0–0.5)
Eosinophils Relative: 1 %
HCT: 48.1 % (ref 39.0–52.0)
Hemoglobin: 16.8 g/dL (ref 13.0–17.0)
Immature Granulocytes: 1 %
Lymphocytes Relative: 15 %
Lymphs Abs: 2.3 10*3/uL (ref 0.7–4.0)
MCH: 31.3 pg (ref 26.0–34.0)
MCHC: 34.9 g/dL (ref 30.0–36.0)
MCV: 89.6 fL (ref 80.0–100.0)
Monocytes Absolute: 1.1 10*3/uL — ABNORMAL HIGH (ref 0.1–1.0)
Monocytes Relative: 7 %
Neutro Abs: 11.5 10*3/uL — ABNORMAL HIGH (ref 1.7–7.7)
Neutrophils Relative %: 75 %
Platelets: 288 10*3/uL (ref 150–400)
RBC: 5.37 MIL/uL (ref 4.22–5.81)
RDW: 12.4 % (ref 11.5–15.5)
WBC: 15.3 10*3/uL — ABNORMAL HIGH (ref 4.0–10.5)
nRBC: 0 % (ref 0.0–0.2)

## 2022-05-19 LAB — GLUCOSE, CAPILLARY
Glucose-Capillary: 103 mg/dL — ABNORMAL HIGH (ref 70–99)
Glucose-Capillary: 108 mg/dL — ABNORMAL HIGH (ref 70–99)
Glucose-Capillary: 124 mg/dL — ABNORMAL HIGH (ref 70–99)
Glucose-Capillary: 138 mg/dL — ABNORMAL HIGH (ref 70–99)
Glucose-Capillary: 155 mg/dL — ABNORMAL HIGH (ref 70–99)
Glucose-Capillary: 175 mg/dL — ABNORMAL HIGH (ref 70–99)
Glucose-Capillary: 245 mg/dL — ABNORMAL HIGH (ref 70–99)

## 2022-05-19 LAB — PROCALCITONIN: Procalcitonin: <0.1 ng/mL

## 2022-05-19 LAB — LIPASE, BLOOD: Lipase: 300 U/L — ABNORMAL HIGH (ref 11–51)

## 2022-05-19 MED ORDER — MORPHINE SULFATE (PF) 2 MG/ML IV SOLN
2.0000 mg | INTRAVENOUS | Status: DC | PRN
  Administered 2022-05-19 – 2022-05-20 (×6): 2 mg via INTRAVENOUS
  Filled 2022-05-19 (×6): qty 1

## 2022-05-19 MED ORDER — B COMPLEX-C PO TABS
1.0000 | ORAL_TABLET | Freq: Every day | ORAL | Status: DC
  Administered 2022-05-19 – 2022-05-23 (×5): 1 via ORAL
  Filled 2022-05-19 (×5): qty 1

## 2022-05-19 MED ORDER — ENSURE MAX PROTEIN PO LIQD
11.0000 [oz_av] | Freq: Two times a day (BID) | ORAL | Status: DC
  Administered 2022-05-19 – 2022-05-24 (×11): 11 [oz_av] via ORAL
  Filled 2022-05-19 (×11): qty 330

## 2022-05-19 MED ORDER — ADULT MULTIVITAMIN W/MINERALS CH
1.0000 | ORAL_TABLET | Freq: Every day | ORAL | Status: DC
  Administered 2022-05-19 – 2022-05-24 (×6): 1 via ORAL
  Filled 2022-05-19 (×6): qty 1

## 2022-05-19 MED ORDER — SODIUM CHLORIDE 0.9 % IV SOLN
2.0000 g | Freq: Three times a day (TID) | INTRAVENOUS | Status: DC
  Administered 2022-05-19: 2 g via INTRAVENOUS
  Filled 2022-05-19: qty 12.5

## 2022-05-19 MED ORDER — ENOXAPARIN SODIUM 40 MG/0.4ML IJ SOSY
40.0000 mg | PREFILLED_SYRINGE | INTRAMUSCULAR | Status: DC
  Administered 2022-05-19 – 2022-05-23 (×5): 40 mg via SUBCUTANEOUS
  Filled 2022-05-19 (×5): qty 0.4

## 2022-05-19 MED ORDER — GADOBUTROL 1 MMOL/ML IV SOLN
9.0000 mL | Freq: Once | INTRAVENOUS | Status: AC | PRN
  Administered 2022-05-19: 9 mL via INTRAVENOUS

## 2022-05-19 NOTE — Progress Notes (Signed)
Initial Nutrition Assessment  INTERVENTION:   -Ensure MAX Protein po BID, each supplement provides 150 kcal and 30 grams of protein   -Multivitamin with minerals daily  -B complex w/ Vitamin C daily  -Placed "Carbohydrate Counting" handout in AVS  NUTRITION DIAGNOSIS:   Increased nutrient needs related to  (acute pancreatitis) as evidenced by estimated needs.  GOAL:   Patient will meet greater than or equal to 90% of their needs  MONITOR:   PO intake, Supplement acceptance, Labs, Weight trends, I & O's  REASON FOR ASSESSMENT:   Consult Diet education  ASSESSMENT:   51 y.o. male with a history of depression, tobacco use, hypertension, cholecystectomy, post-ERCP necrotizing pancreatitis with pseudocyst. Patient presented secondary to nausea, vomiting abdominal pain and found to have evidence of pancreatitis with pseudocyst.  Patient in room, states he has been tolerating clears. GI to advance diet to full liquids today. Pt states for a few days PTA he was not eating anything d/t N/V. Pt otherwise eats well, consumes plenty of protein foods in diet.  Pt is agreeable to receiving protein shakes while on liquid diet.  Given pancreatitis, will order daily MVI and B complex w/ C.  RD consulted for diet education regarding diabetes. Encouraged pt to eat consistent meals, with protein. Pt states he does drink soda. Encouraged pt to only drink unsweetened beverages. Will place carbohydrate counting handout in AVS. Pt with no diet related questions for RD at this time.  Per weight records, pt has lost 8 lbs since 7/25 (3% wt loss x 3.5 months, insignificant for time frame).  Medications: Creon, Lactated ringers  Labs reviewed: CBGs: 117-193 Low Na   NUTRITION - FOCUSED PHYSICAL EXAM:  No depletions noted.  Diet Order:   Diet Order             Diet full liquid Room service appropriate? Yes; Fluid consistency: Thin  Diet effective now                   EDUCATION  NEEDS:   Education needs have been addressed  Skin:  Skin Assessment: Reviewed RN Assessment  Last BM:  11/11  Height:   Ht Readings from Last 1 Encounters:  05/18/22 '5\' 10"'$  (1.778 m)    Weight:   Wt Readings from Last 1 Encounters:  05/18/22 94.3 kg    BMI:  Body mass index is 29.84 kg/m.  Estimated Nutritional Needs:   Kcal:  2000-2200  Protein:  110-120g  Fluid:  2L/day  Clayton Bibles, MS, RD, LDN Inpatient Clinical Dietitian Contact information available via Amion

## 2022-05-19 NOTE — Inpatient Diabetes Management (Signed)
Inpatient Diabetes Program Recommendations  AACE/ADA: New Consensus Statement on Inpatient Glycemic Control (2015)  Target Ranges:  Prepandial:   less than 140 mg/dL      Peak postprandial:   less than 180 mg/dL (1-2 hours)      Critically ill patients:  140 - 180 mg/dL   Lab Results  Component Value Date   GLUCAP 155 (H) 05/19/2022   HGBA1C 6.3 (H) 05/17/2022    Diabetes history: New onset DM ? Outpatient Diabetes medications: none Current orders for Inpatient glycemic control: Novolog 0-15 units Q4H   Inpatient Diabetes Program Recommendations:     Consider changing correction to TID & HS.   Awaiting blood glucose trends following ERCP and increased oral intake to better assess insulin needs.  Patient will need a meter at discharge. Meter kit [id number].  Thanks, Bronson Curb, MSN, RNC-OB Diabetes Coordinator [phone] (8a-5p)

## 2022-05-19 NOTE — Progress Notes (Addendum)
Patient ID: Steve Romero, male   DOB: 27-Dec-1970, 51 y.o.   MRN: [id number]    Progress Note   Subjective  Day # 2  CC; nausea, vomiting and abdominal pain-history of severe complicated necrotizing pancreatitis   Maxipime/Flagyl IV  Labs-WBC down to 15.3/hemoglobin 16.8/hematocrit 48.1  Sodium 133/potassium 3.7/glucose 139 BUN 34/creatinine 1.21-much improved T. bili 1.6/alk phos 158/AST 22/ALT 29 Lipase 300 Procalcitonin less than 0.10  Patient sitting up in bed, was able to take clear liquids without difficulty still having intermittent nausea but no further vomiting, says he feels about the same after liquid intake no increase in pain.  His pain is about the same as on admission, says the pain medication is not lasting long enough    Objective   Vital signs in last 24 hours: Temp:  [97.4 F (36.3 C)-97.9 F (36.6 C)] 97.5 F (36.4 C) (11/14 0410) Pulse Rate:  [63-96] 78 (11/14 0410) Resp:  [11-21] 14 (11/14 0410) BP: (130-169)/(74-120) 146/98 (11/14 0410) SpO2:  [91 %-97 %] 95 % (11/14 0410) Weight:  [94.3 kg] 94.3 kg (11/13 1520) Last BM Date : 05/16/22 General:    Older white male in NAD Heart:  Regular rate and rhythm; no murmurs Lungs: Respirations even and unlabored, lungs CTA bilaterally Abdomen:  Soft, tender across the upper abdomen with some guarding, no rebound, no distention normal bowel sounds. Extremities:  Without edema. Neurologic:  Alert and oriented,  grossly normal neurologically. Psych:  Cooperative. Normal mood and affect.  Intake/Output from previous day: 11/13 0701 - 11/14 0700 In: 1286.8 [I.V.:1086.8; IV Piggyback:200] Out: 450 [Urine:450] Intake/Output this shift: No intake/output data recorded.  Lab Results: Recent Labs    05/17/22 0835 05/17/22 1801 05/18/22 0433 05/19/22 0527  WBC 18.8*  --  22.8* 15.3*  HGB 21.6* 18.4* 18.4* 16.8  HCT 58.3* 54.0* 51.9 48.1  PLT 386  --  327 288   BMET Recent Labs    05/17/22 1801  05/18/22 0433 05/19/22 0527  NA 135  134* 140 133*  K 3.6  3.7 3.5 3.7  CL 93*  95* 97* 91*  CO2 21* 28 32  GLUCOSE 371*  389* 126* 139*  BUN 50*  45* 54* 34*  CREATININE 3.95*  3.80* 2.47* 1.21  CALCIUM 9.0 8.5* 8.5*   LFT Recent Labs    05/19/22 0527  PROT 7.1  ALBUMIN 3.9  AST 22  ALT 29  ALKPHOS 158*  BILITOT 1.6*   PT/INR No results for input(s): "LABPROT", "INR" in the last 72 hours.  Studies/Results: CT ABDOMEN PELVIS WO CONTRAST  Result Date: 05/17/2022 CLINICAL DATA:  Nausea and vomiting.  Pancreatitis. EXAM: CT ABDOMEN AND PELVIS WITHOUT CONTRAST TECHNIQUE: Multidetector CT imaging of the abdomen and pelvis was performed following the standard protocol without IV contrast. RADIATION DOSE REDUCTION: This exam was performed according to the departmental dose-optimization program which includes automated exposure control, adjustment of the mA and/or kV according to patient size and/or use of iterative reconstruction technique. COMPARISON:  09/25/2021 and 04/21/2022. FINDINGS: Lower chest: No acute abnormality. Hepatobiliary: Increase low-attenuation within the posterior aspect of the lateral segment of left hepatic lobe and medial segment of left hepatic lobe compared with 04/21/2022. This is favored to represent an area of increased focal fatty deposition, image 20/2. Status post cholecystectomy. Similar appearance of mild central intrahepatic bile duct dilatation. Pancreas: Peripancreatic soft tissue stranding is again identified compatible with acute pancreatitis. Previously noted pseudocyst centered around the neck of pancreas appears increased in size from  previous exam measuring 4.0 x 4.0 cm, image 28/2. On the exam from 04/21/22 this measured 1.9 x 1.6 cm. Spleen: Normal in size without focal abnormality. Adrenals/Urinary Tract: Normal adrenal glands. No nephrolithiasis, hydronephrosis or mass. Urinary bladder appears decompressed. Stomach/Bowel: Pancreatic pseudo  cyst extends into the lesser sac and has mass effect upon the distal stomach. Stomach appears nondistended. No significant bowel wall thickening, inflammation, or distension. The appendix is visualized and appears normal. Vascular/Lymphatic: Normal appearance of the abdominal aorta. No signs of abdominopelvic adenopathy. Reproductive: Prostate is unremarkable. Other: Small fat containing umbilical hernia and small fat containing right inguinal hernia. No abdominopelvic ascites. Musculoskeletal: No acute or suspicious osseous findings. 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 Moderate L3-4 degenerative disc disease IMPRESSION: 1. Acute pancreatitis. 2. Interval increase in size of pseudocyst centered around the neck of pancreas. 3. Increase low-attenuation within the posterior aspect of the lateral segment of left hepatic lobe and medial segment of left hepatic lobe compared with 04/21/2022. This is favored to represent an area of increased focal fatty deposition. 4. 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 5. Small fat containing umbilical hernia and small fat containing right inguinal hernia. Electronically Signed   By: Kerby Moors M.D.   On: 05/17/2022 13:06       Assessment / Plan:    #6 51 year old white male with 1-1/2-week history of nausea and vomiting progressing to being intractable, he did have a few days of diarrhea, and has had ongoing upper abdominal pain which had not been severe over the previous week prior to admission  he has history of severe complicated necrotizing pancreatitis initial onset after cholecystectomy in 2022 for biliary pancreatitis and then complicated by bile leak and subsequent severe post ERCP pancreatitis.  He had undergone numerous procedures over the past year and a half. Had last ERCP which was done at [facility] February 2023 a plastic biliary stent and previously placed metal biliary stent  were both able to be removed, pancreatic duct was injected noting mild irregularity in the ventral pancreatic duct in the head of the pancreas but unable to access the ventral pancreatic duct  CT in October 2023 had shown mild peripancreatic stranding and recurrence of a small pseudocyst in the neck of the pancreas measuring 1.9 cm. Repeat imaging here shows an enlarged pseudocyst in the pancreatic neck now measuring 4 cm, also with evidence of active pancreatitis not severe and no evidence for pancreatic necrosis  Did have leukocytosis on admission which has improved today and had been covered empirically with IV antibiotics which I am not sure are necessary at this point  Etiology of the recurrent pancreatitis and recurrent pseudocyst is not entirely clear though if there is concern for possible pancreatic duct stricture, obstruction  #2 new diagnosis of adult onset diabetes mellitus this admission #3 acute kidney injury secondary to above improved/resolved  #4 pancreatic insufficiency on pancreatic enzyme supplementation at home we will resume with advancement of diet  Plan:  Advance to full liquid diet Continue daily IV PPI As needed antiemetics Will adjust pain medication Schedule for MRI/MRCP  Think we can discontinue the IV antibiotics Follow-up labs in a.m.    LOS: 2 days   Amy Esterwood PA-C 05/19/2022, 8:42 AM   Attending Physician Note   I have taken an interval history, reviewed the chart and examined the patient. I performed a substantive portion of this encounter, including complete performance of at  least one of the key components, in conjunction with the APP. I agree with the APP's note, impression and recommendations with my edits. My additional impressions and recommendations are as follows.   Acute pancreatitis, enlarging pseudocyst. History of complicated necrotizing pancreatitis secondary to gallstone / post ERCP pancreatitis further complicated by biliary  obstruction and recurrent pseudocysts. R/O PD stricture.  Continue IVF, pain and nausea control. Schedule MRI/MRCP. Consider pseudocyst aspiration. Follow up CT APs to monitor pseudocyst. Dr. Rush Landmark will assume his GI care tomorrow.    [name], MD [facility] See AMION, [facility], for our on call provider

## 2022-05-19 NOTE — Progress Notes (Signed)
PROGRESS NOTE    Steve Romero  QMV:[id number] DOB: August 30, 1970 DOA: 05/17/2022 PCP: [name], DO   Brief Narrative: Steve Romero is a 51 y.o. male with a history of depression, tobacco use, hypertension, cholecystectomy, post-ERCP necrotizing pancreatitis with pseudocyst. Patient presented secondary to nausea, vomiting abdominal pain and found to have evidence of pancreatitis with pseudocyst. GI consulted. NPO initially. Advancing diet.   Assessment and Plan: * AKI (acute kidney injury) ([place]) Baseline creatinine of 1. Creatinine of 3.2 on admission with peak of 3.95. IV fluids initiated. Creatinine improved to 1.21 this morning. -Continue IV fluids; likely discontinue in AM  Acute pancreatitis Complicated by pseudocyst without necrosis. Associated leukocytosis with unclear source. Blood cultures obtained and are pending (no growth to date). Empiric Cefepime and Flagyl started with improvement of leukocytosis -Continue IV fluids -Analgesics -Blood cultures with no growth to date; will discontinue antibiotics -GI recommendations: Full liquid diet, MRI/MRCP on 11/15  Erythrocytosis Chronic with slight worsening on admission likely related to dehydration. Improved.  Type 2 diabetes mellitus with hyperglycemia Hemoglobin A1C of 6.3% is not consistent with diabetes, but blood sugar >300 g/dL on admission. Diabetes coordinator consulted. -Continue SSI  Exocrine pancreatic insufficiency -Continue Creon  Obesity Patient does not meet criteria for obesity. Estimated body mass index is 29.84 kg/m as calculated from the following:   Height as of this encounter: '5\' 10"'$  (1.778 m).   Weight as of this encounter: 94.3 kg.  HTN (hypertension) -Continue hydralazine PRN    DVT prophylaxis: Lovenox Code Status:   Code Status: Full Code Family Communication: None at bedside Disposition Plan: Discharge home likely in several days pending advancement of diet and GI  recommendations   Consultants:  [facility]  Procedures:  None  Antimicrobials: Cefepime Flagyl    Subjective: Continued improvement of abdominal pain. No nausea/vomiting.  Objective: BP (!) 146/98 (BP Location: Left Arm)   Pulse 78   Temp (!) 97.5 F (36.4 C) (Oral)   Resp 14   Ht '5\' 10"'$  (1.778 m)   Wt 94.3 kg   SpO2 95%   BMI 29.84 kg/m   Examination:  General exam: Appears calm and comfortable Respiratory system: Clear to auscultation. Respiratory effort normal. Cardiovascular system: S1 & S2 heard, RRR. No murmurs, rubs, gallops or clicks. Gastrointestinal system: Abdomen is distended, soft and mild-moderately tender in left quadrant. Normal bowel sounds heard. Central nervous system: Alert and oriented. No focal neurological deficits. Musculoskeletal: No edema. No calf tenderness Skin: No cyanosis. No rashes Psychiatry: Judgement and insight appear normal. Mood & affect appropriate.    Data Reviewed: I have personally reviewed following labs and imaging studies  CBC Lab Results  Component Value Date   WBC 15.3 (H) 05/19/2022   RBC 5.37 05/19/2022   HGB 16.8 05/19/2022   HCT 48.1 05/19/2022   MCV 89.6 05/19/2022   MCH 31.3 05/19/2022   PLT 288 05/19/2022   MCHC 34.9 05/19/2022   RDW 12.4 05/19/2022   LYMPHSABS 2.3 05/19/2022   MONOABS 1.1 (H) 05/19/2022   EOSABS 0.2 05/19/2022   BASOSABS 0.1 28/41/3244     Last metabolic panel Lab Results  Component Value Date   NA 133 (L) 05/19/2022   K 3.7 05/19/2022   CL 91 (L) 05/19/2022   CO2 32 05/19/2022   BUN 34 (H) 05/19/2022   CREATININE 1.21 05/19/2022   GLUCOSE 139 (H) 05/19/2022   GFRNONAA >60 05/19/2022   GFRAA 85 10/04/2019   CALCIUM 8.5 (L) 05/19/2022   PHOS  4.1 09/04/2021   PROT 7.1 05/19/2022   ALBUMIN 3.9 05/19/2022   LABGLOB 2.3 07/09/2021   AGRATIO 1.9 07/09/2021   BILITOT 1.6 (H) 05/19/2022   ALKPHOS 158 (H) 05/19/2022   AST 22 05/19/2022   ALT 29 05/19/2022    ANIONGAP 10 05/19/2022    GFR: Estimated Creatinine Clearance: 83.3 mL/min (by C-G formula based on SCr of 1.21 mg/dL).  Recent Results (from the past 240 hour(s))  Culture, blood (routine x 2)     Status: None (Preliminary result)   Collection Time: 05/17/22  6:30 PM   Specimen: BLOOD  Result Value Ref Range Status   Specimen Description   Final    BLOOD SITE NOT SPECIFIED Performed at [facility] [address], [place], [place] [address]    Special Requests   Final    BOTTLES DRAWN AEROBIC AND ANAEROBIC Blood Culture results may not be optimal due to an excessive volume of blood received in culture bottles Performed at [facility] [address] [place], [place] [address]    Culture   Final    NO GROWTH 2 DAYS Performed at [facility] [address], [place], [place] [address]    Report Status PENDING  Incomplete  Culture, blood (routine x 2)     Status: None (Preliminary result)   Collection Time: 05/17/22 10:54 PM   Specimen: BLOOD  Result Value Ref Range Status   Specimen Description   Final    BLOOD BLOOD LEFT FOREARM Performed at [facility] [address] [place], [place] [address]    Special Requests   Final    BOTTLES DRAWN AEROBIC AND ANAEROBIC Blood Culture adequate volume Performed at [facility] [address] [place],  [address]    Culture   Final    NO GROWTH 1 DAY Performed at [facility], [place] [address] [place],  [address]    Report Status PENDING  Incomplete      Radiology Studies: CT ABDOMEN PELVIS WO CONTRAST  Result Date: 05/17/2022 CLINICAL DATA:  Nausea and vomiting.  Pancreatitis. EXAM: CT ABDOMEN AND PELVIS WITHOUT CONTRAST TECHNIQUE: Multidetector CT imaging of the abdomen and pelvis was performed following the standard protocol without IV contrast. RADIATION DOSE REDUCTION: This exam was performed according to the  departmental dose-optimization program which includes automated exposure control, adjustment of the mA and/or kV according to patient size and/or use of iterative reconstruction technique. COMPARISON:  09/25/2021 and 04/21/2022. FINDINGS: Lower chest: No acute abnormality. Hepatobiliary: Increase low-attenuation within the posterior aspect of the lateral segment of left hepatic lobe and medial segment of left hepatic lobe compared with 04/21/2022. This is favored to represent an area of increased focal fatty deposition, image 20/2. Status post cholecystectomy. Similar appearance of mild central intrahepatic bile duct dilatation. Pancreas: Peripancreatic soft tissue stranding is again identified compatible with acute pancreatitis. Previously noted pseudocyst centered around the neck of pancreas appears increased in size from previous exam measuring 4.0 x 4.0 cm, image 28/2. On the exam from 04/21/22 this measured 1.9 x 1.6 cm. Spleen: Normal in size without focal abnormality. Adrenals/Urinary Tract: Normal adrenal glands. No nephrolithiasis, hydronephrosis or mass. Urinary bladder appears decompressed. Stomach/Bowel: Pancreatic pseudo cyst extends into the lesser sac and has mass effect upon the distal stomach. Stomach appears nondistended. No significant bowel wall thickening, inflammation, or distension. The appendix is visualized and appears normal. Vascular/Lymphatic: Normal appearance of the abdominal aorta. No signs  of abdominopelvic adenopathy. Reproductive: Prostate is unremarkable. Other: Small fat containing umbilical hernia and small fat containing right inguinal hernia. No abdominopelvic ascites. Musculoskeletal: No acute or suspicious osseous findings. 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 Moderate L3-4 degenerative disc disease IMPRESSION: 1. Acute pancreatitis. 2. Interval increase in size of pseudocyst centered around the neck of pancreas. 3. Increase low-attenuation within  the posterior aspect of the lateral segment of left hepatic lobe and medial segment of left hepatic lobe compared with 04/21/2022. This is favored to represent an area of increased focal fatty deposition. 4. 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 5. Small fat containing umbilical hernia and small fat containing right inguinal hernia. Electronically Signed   By: Kerby Moors M.D.   On: 05/17/2022 13:06      LOS: 2 days    Cordelia Poche, MD [facility] 05/19/2022, 12:21 PM   If 7PM-7AM, please contact night-coverage [email or web address]

## 2022-05-20 DIAGNOSIS — K862 Cyst of pancreas: Secondary | ICD-10-CM

## 2022-05-20 DIAGNOSIS — K859 Acute pancreatitis without necrosis or infection, unspecified: Secondary | ICD-10-CM

## 2022-05-20 DIAGNOSIS — K863 Pseudocyst of pancreas: Secondary | ICD-10-CM | POA: Diagnosis not present

## 2022-05-20 DIAGNOSIS — K838 Other specified diseases of biliary tract: Secondary | ICD-10-CM

## 2022-05-20 DIAGNOSIS — R7989 Other specified abnormal findings of blood chemistry: Secondary | ICD-10-CM

## 2022-05-20 DIAGNOSIS — K8681 Exocrine pancreatic insufficiency: Secondary | ICD-10-CM | POA: Diagnosis not present

## 2022-05-20 DIAGNOSIS — R739 Hyperglycemia, unspecified: Secondary | ICD-10-CM

## 2022-05-20 DIAGNOSIS — N179 Acute kidney failure, unspecified: Secondary | ICD-10-CM | POA: Diagnosis not present

## 2022-05-20 LAB — COMPREHENSIVE METABOLIC PANEL
ALT: 25 U/L (ref 0–44)
AST: 17 U/L (ref 15–41)
Albumin: 3.3 g/dL — ABNORMAL LOW (ref 3.5–5.0)
Alkaline Phosphatase: 128 U/L — ABNORMAL HIGH (ref 38–126)
Anion gap: 7 (ref 5–15)
BUN: 19 mg/dL (ref 6–20)
CO2: 31 mmol/L (ref 22–32)
Calcium: 8.3 mg/dL — ABNORMAL LOW (ref 8.9–10.3)
Chloride: 99 mmol/L (ref 98–111)
Creatinine, Ser: 0.91 mg/dL (ref 0.61–1.24)
GFR, Estimated: >60 mL/min (ref >60)
Glucose, Bld: 131 mg/dL — ABNORMAL HIGH (ref 70–99)
Potassium: 3.3 mmol/L — ABNORMAL LOW (ref 3.5–5.1)
Sodium: 137 mmol/L (ref 135–145)
Total Bilirubin: 1.3 mg/dL — ABNORMAL HIGH (ref 0.3–1.2)
Total Protein: 6 g/dL — ABNORMAL LOW (ref 6.5–8.1)

## 2022-05-20 LAB — CBC WITH DIFFERENTIAL/PLATELET
Abs Immature Granulocytes: 0.04 10*3/uL (ref 0.00–0.07)
Basophils Absolute: 0.1 10*3/uL (ref 0.0–0.1)
Basophils Relative: 1 %
Eosinophils Absolute: 0.2 10*3/uL (ref 0.0–0.5)
Eosinophils Relative: 2 %
HCT: 42.1 % (ref 39.0–52.0)
Hemoglobin: 14.5 g/dL (ref 13.0–17.0)
Immature Granulocytes: 0 %
Lymphocytes Relative: 18 %
Lymphs Abs: 1.7 10*3/uL (ref 0.7–4.0)
MCH: 30.9 pg (ref 26.0–34.0)
MCHC: 34.4 g/dL (ref 30.0–36.0)
MCV: 89.8 fL (ref 80.0–100.0)
Monocytes Absolute: 0.8 10*3/uL (ref 0.1–1.0)
Monocytes Relative: 8 %
Neutro Abs: 6.8 10*3/uL (ref 1.7–7.7)
Neutrophils Relative %: 71 %
Platelets: 222 10*3/uL (ref 150–400)
RBC: 4.69 MIL/uL (ref 4.22–5.81)
RDW: 12.2 % (ref 11.5–15.5)
WBC: 9.5 10*3/uL (ref 4.0–10.5)
nRBC: 0 % (ref 0.0–0.2)

## 2022-05-20 LAB — GLUCOSE, CAPILLARY
Glucose-Capillary: 131 mg/dL — ABNORMAL HIGH (ref 70–99)
Glucose-Capillary: 116 mg/dL — ABNORMAL HIGH (ref 70–99)
Glucose-Capillary: 106 mg/dL — ABNORMAL HIGH (ref 70–99)
Glucose-Capillary: 116 mg/dL — ABNORMAL HIGH (ref 70–99)
Glucose-Capillary: 167 mg/dL — ABNORMAL HIGH (ref 70–99)

## 2022-05-20 LAB — PROCALCITONIN: Procalcitonin: <0.1 ng/mL

## 2022-05-20 LAB — LIPASE, BLOOD: Lipase: 284 U/L — ABNORMAL HIGH (ref 11–51)

## 2022-05-20 MED ORDER — POTASSIUM CHLORIDE 20 MEQ PO PACK
40.0000 meq | PACK | Freq: Once | ORAL | Status: AC
  Administered 2022-05-20: 40 meq via ORAL
  Filled 2022-05-20: qty 2

## 2022-05-20 MED ORDER — LACTATED RINGERS IV SOLN: INTRAVENOUS | Status: AC

## 2022-05-20 MED ORDER — INSULIN ASPART 100 UNIT/ML IJ SOLN
0.0000 [IU] | Freq: Three times a day (TID) | INTRAMUSCULAR | Status: DC
  Administered 2022-05-21 – 2022-05-22 (×3): 2 [IU] via SUBCUTANEOUS
  Administered 2022-05-22: 8 [IU] via SUBCUTANEOUS
  Administered 2022-05-23: 2 [IU] via SUBCUTANEOUS
  Administered 2022-05-23: 5 [IU] via SUBCUTANEOUS
  Administered 2022-05-24: 3 [IU] via SUBCUTANEOUS

## 2022-05-20 MED ORDER — HYDROMORPHONE HCL 1 MG/ML IJ SOLN
1.0000 mg | INTRAMUSCULAR | Status: DC | PRN
  Administered 2022-05-20 – 2022-05-24 (×17): 1 mg via INTRAVENOUS
  Filled 2022-05-20 (×17): qty 1

## 2022-05-20 MED ORDER — BOOST / RESOURCE BREEZE PO LIQD CUSTOM: 1.0000 | Freq: Three times a day (TID) | ORAL | Status: DC

## 2022-05-20 MED ORDER — LORAZEPAM 2 MG/ML IJ SOLN: 0.5000 mg | Freq: Once | INTRAMUSCULAR | Status: DC

## 2022-05-20 NOTE — Assessment & Plan Note (Signed)
·   Please see assessment and plan above °

## 2022-05-20 NOTE — Progress Notes (Signed)
Patient ID: Steve Romero, male   DOB: Feb 08, 1971, 51 y.o.   MRN: [id number]    Progress Note   Subjective   Day # 3  CC; nausea, vomiting and abdominal pain in setting of history of severe complicated necrotizing pancreatitis  Antibiotics stopped yesterday  MRI/MRCP-status postcholecystectomy, mild intrahepatic ductal dilation, dilated CBD at 10 mm, motion degradation but suspected choledocholithiasis versus debris in distal CBD measuring up to 13 mm .  Pancreatic inflammatory changes, dominant 4.7 cm pseudocyst in the pancreatic body, adjacent pseudocyst versus segmental ductal dilation in the proximal pancreatic tail, and segmental parenchymal atrophy in the distal pancreatic tail .  Labs-WBC 9.5/hemoglobin 14.5/hematocrit 42.1 Lipase 284 Procalcitonin normal Potassium 3.3/BUN 19/creatinine 0.91 T. bili 1.3/alk phos 128/AST 17/ALT 25  Patient says his pain level is currently at an 8, he does not feel the morphine is working well, he did have some oxycodone earlier this morning but says he has been afraid to keep asking for pain medicine No vomiting today, keeping down full liquids   Objective   Vital signs in last 24 hours: Temp:  [97.8 F (36.6 C)-99.2 F (37.3 C)] 98.2 F (36.8 C) (11/15 0412) Pulse Rate:  [64-69] 65 (11/15 0412) Resp:  [14] 14 (11/15 0412) BP: (135-143)/(67-90) 135/90 (11/15 0412) SpO2:  [91 %-97 %] 91 % (11/15 0412) Last BM Date : 05/16/22 General:    white male in NAD Heart:  Regular rate and rhythm; no murmurs Lungs: Respirations even and unlabored, lungs CTA bilaterally Abdomen:  Soft, nondistended, tender across the upper abdomen, some guarding normal bowel sounds. Extremities:  Without edema. Neurologic:  Alert and oriented,  grossly normal neurologically. Psych:  Cooperative. Normal mood and affect.  Intake/Output from previous day: 11/14 0701 - 11/15 0700 In: 2643.5 [P.O.:708; I.V.:1935.5] Out: -  Intake/Output this shift: No  intake/output data recorded.  Lab Results: Recent Labs    05/18/22 0433 05/19/22 0527 05/20/22 0529  WBC 22.8* 15.3* 9.5  HGB 18.4* 16.8 14.5  HCT 51.9 48.1 42.1  PLT 327 288 222   BMET Recent Labs    05/18/22 0433 05/19/22 0527 05/20/22 0529  NA 140 133* 137  K 3.5 3.7 3.3*  CL 97* 91* 99  CO2 28 32 31  GLUCOSE 126* 139* 131*  BUN 54* 34* 19  CREATININE 2.47* 1.21 0.91  CALCIUM 8.5* 8.5* 8.3*   LFT Recent Labs    05/20/22 0529  PROT 6.0*  ALBUMIN 3.3*  AST 17  ALT 25  ALKPHOS 128*  BILITOT 1.3*   PT/INR No results for input(s): "LABPROT", "INR" in the last 72 hours.  Studies/Results: MR ABDOMEN MRCP W WO CONTAST  Result Date: 05/19/2022 CLINICAL DATA:  Acute pancreatitis EXAM: MRI ABDOMEN WITHOUT AND WITH CONTRAST (INCLUDING MRCP) TECHNIQUE: Multiplanar multisequence MR imaging of the abdomen was performed both before and after the administration of intravenous contrast. Heavily T2-weighted images of the biliary and pancreatic ducts were obtained, and three-dimensional MRCP images were rendered by post processing. CONTRAST:  9mL GADAVIST GADOBUTROL 1 MMOL/ML IV SOLN COMPARISON:  CT abdomen/pelvis dated 05/17/2022 FINDINGS: Severely motion degraded images. Lower chest: Lung bases are clear. Hepatobiliary: Liver is within normal limits. No suspicious/enhancing hepatic lesions. No definite hepatic steatosis. Status post cholecystectomy. Mild intrahepatic ductal dilatation in the central left hepatic lobe (series 5/image 14). Dilated common duct, measuring 10 mm centrally. Although motion degraded, there is suspected choledocholithiasis versus debris in the distal CBD, measuring up to 13 mm (series 4/image 17). Pancreas: Peripancreatic inflammatory changes  in this patient with known acute pancreatitis. Dominant 4.7 cm pseudocyst in the pancreatic body (series 5/image 19). Adjacent pseudocysts versus segmental ductal dilatation (series 5/image 19) in the proximal pancreatic  tail. Segmental parenchymal atrophy in the distal pancreatic tail. Spleen:  Within normal limits. Adrenals/Urinary Tract:  Adrenal glands are within normal limits. Kidneys are within normal limits.  No hydronephrosis. Stomach/Bowel: Stomach is within normal limits. Visualized bowel is grossly unremarkable. Vascular/Lymphatic:  No evidence of abdominal aortic aneurysm. No suspicious abdominal lymphadenopathy. Other:  No abdominal ascites. Musculoskeletal: No focal osseous lesions. IMPRESSION: Severely motion degraded images, limiting evaluation. Acute pancreatitis with dominant 4.7 cm pseudocyst, as above. Status post cholecystectomy. Mild intrahepatic and extrahepatic ductal dilatation. Common duct measures 10 mm. Suspected choledocholithiasis with 13 mm distal CBD stone (versus debris), as above. Consider ERCP as clinically warranted. Electronically Signed   By: Charline Bills M.D.   On: 05/19/2022 20:36   MR 3D Recon At Scanner  Result Date: 05/19/2022 CLINICAL DATA:  Acute pancreatitis EXAM: MRI ABDOMEN WITHOUT AND WITH CONTRAST (INCLUDING MRCP) TECHNIQUE: Multiplanar multisequence MR imaging of the abdomen was performed both before and after the administration of intravenous contrast. Heavily T2-weighted images of the biliary and pancreatic ducts were obtained, and three-dimensional MRCP images were rendered by post processing. CONTRAST:  9mL GADAVIST GADOBUTROL 1 MMOL/ML IV SOLN COMPARISON:  CT abdomen/pelvis dated 05/17/2022 FINDINGS: Severely motion degraded images. Lower chest: Lung bases are clear. Hepatobiliary: Liver is within normal limits. No suspicious/enhancing hepatic lesions. No definite hepatic steatosis. Status post cholecystectomy. Mild intrahepatic ductal dilatation in the central left hepatic lobe (series 5/image 14). Dilated common duct, measuring 10 mm centrally. Although motion degraded, there is suspected choledocholithiasis versus debris in the distal CBD, measuring up to 13 mm  (series 4/image 17). Pancreas: Peripancreatic inflammatory changes in this patient with known acute pancreatitis. Dominant 4.7 cm pseudocyst in the pancreatic body (series 5/image 19). Adjacent pseudocysts versus segmental ductal dilatation (series 5/image 19) in the proximal pancreatic tail. Segmental parenchymal atrophy in the distal pancreatic tail. Spleen:  Within normal limits. Adrenals/Urinary Tract:  Adrenal glands are within normal limits. Kidneys are within normal limits.  No hydronephrosis. Stomach/Bowel: Stomach is within normal limits. Visualized bowel is grossly unremarkable. Vascular/Lymphatic:  No evidence of abdominal aortic aneurysm. No suspicious abdominal lymphadenopathy. Other:  No abdominal ascites. Musculoskeletal: No focal osseous lesions. IMPRESSION: Severely motion degraded images, limiting evaluation. Acute pancreatitis with dominant 4.7 cm pseudocyst, as above. Status post cholecystectomy. Mild intrahepatic and extrahepatic ductal dilatation. Common duct measures 10 mm. Suspected choledocholithiasis with 13 mm distal CBD stone (versus debris), as above. Consider ERCP as clinically warranted. Electronically Signed   By: Charline Bills M.D.   On: 05/19/2022 20:36       Assessment / Plan:    #25 51 year old white male admitted with 1-1/2-week history of nausea vomiting, and progressive upper abdominal pain in setting of previous severe complicated necrotizing pancreatitis  Numerous previous procedures, cyst gastrectomy for pseudocyst, biliary stent placements and removals. [facility] February 2023 with removal of plastic biliary stent and the previously placed metal biliary stent, pancreatic duct was injected noting mild irregularity in the ventral pancreatic duct in the head of the pancreas but unable to access the ventral pancreatic duct  Has an enlarging pseudocyst in the pancreatic body, active pancreatitis not severe, and adjacent to the dominant pseudocyst he either  has additional small pseudocyst versus segmental ductal dilation in the proximal pancreatic tail There is also suspicion on  the MRCP of common bile duct stone versus debris   LFTs have been completely normal  #2 new diagnosis of adult onset diabetes mellitus this admission #3 acute kidney injury resolved #4 pancreatic insufficiency on enzyme supplementation at home, receiving here with full liquids  Plan Continue full liquids with supplements IV PPI daily Discontinue morphine and change to Dilaudid 1 mg every 4 hours as needed Dr.Mansouraty to review MRI/MRCP images and decide regarding occasion for any endoscopic intervention    Principal Problem:   AKI (acute kidney injury) ([facility]) Active Problems:   HTN (hypertension)   Obesity   Acute pancreatitis   Exocrine pancreatic insufficiency   Type 2 diabetes mellitus with hyperglycemia   Erythrocytosis   Pseudocyst, pancreas     LOS: 3 days   Jayline Kilburg EsterwoodPA-C  05/20/2022, 11:26 AM

## 2022-05-20 NOTE — Progress Notes (Signed)
PROGRESS NOTE   Steve Romero  UUV:[id number] DOB: 1970/10/08 DOA: 05/17/2022 PCP: [name], DO   Date of Service: the patient was seen and examined on 05/20/2022  Brief Narrative:  51 y.o. 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    Patient now presents with several days of nausea vomiting so [name] on 11/12.  Upon evaluation patient was found to have acute pancreatitis with pseudocyst formation.  Presentation complicated by acute kidney injury as well as new diagnosis of diabetes.  Possible prescription was then called to assess the patient for admission to the hospital.    Patient was managed with a regimen of intravenous empiric cefepime and Flagyl.  MRCP showing recurrent CBD stone.  Gastroenterology is consulted.    Assessment and Plan: * AKI (acute kidney injury) ([place]) Baseline creatinine of 1.  Creatinine of 3.2 on admission with peak of 3.95.  Improved with IV fluids   Acute pancreatitis MRCP performed 11/14 once again demonstrates acute pancreatitis with dominant 4.7 cm pseudocyst Possible CBD stone versus debris GI following Gentle intravenous hydration Continue as needed opiate-based analgesics Associated leukocytosis has resolved Empiric antibiotic therapy discontinued with negative cultures with normal procalcitonin   Pseudocyst, pancreas Please see assessment and plan above  Exocrine pancreatic insufficiency -Continue Creon  Essential hypertension Blood pressures improved Continue hydralazine PRN  Hyperglycemia initial blood sugars well over 300 on admission with glycemic control improved  Hemoglobin A1C of 6.3%  Continue SSI  Obesity Patient does not meet criteria for obesity. Estimated body mass index is 29.84 kg/m as calculated  from the following:   Height as of this encounter: '5\' 10"'$  (1.778 m).   Weight as of this encounter: 94.3 kg.  Erythrocytosis-resolved as of 05/20/2022 Chronic with slight worsening on admission likely related to dehydration. Improved.       Subjective:  Patient still complaining of abdominal pain, epigastric in location, moderate to severe in intensity, sharp in quality, worse with as needed opiate-based analgesics.  Patient is also complaining of associated difficulty with tolerating oral intake.  Physical Exam:  Vitals:   05/19/22 2008 05/20/22 0412 05/20/22 1331 05/20/22 2004  BP: (!) 140/67 (!) 135/90 (!) 140/80 122/87  Pulse: 69 65 72 68  Resp: '14 14 16 18  '$ Temp: 99.2 F (37.3 C) 98.2 F (36.8 C) 98 F (36.7 C) 98.4 F (36.9 C)  TempSrc: Oral Oral Oral Oral  SpO2: 97% 91% 97% 96%  Weight:      Height:         Constitutional: Awake alert and oriented x3, no associated distress.   Skin: no rashes, no lesions, good skin turgor noted. Eyes: Pupils are equally reactive to light.  No evidence of scleral icterus or conjunctival pallor.  ENMT: Moist mucous membranes noted.  Posterior pharynx clear of any exudate or lesions.   Respiratory: clear to auscultation bilaterally, no wheezing, no crackles. Normal respiratory effort. No accessory muscle use.  Cardiovascular: Regular rate and rhythm, no murmurs / rubs / gallops. No extremity edema. 2+ pedal pulses. No carotid bruits.  Abdomen: Abdomen is generally tender, worst in the epigastric region.  Abdomen is soft.  No evidence of intra-abdominal masses.  Positive bowel sounds noted in all quadrants.   Musculoskeletal: No joint deformity upper and lower extremities. Good ROM, no contractures. Normal muscle tone.    Data  Reviewed:  I have personally reviewed and interpreted labs, imaging.  Significant findings are   CBC: Recent Labs  Lab 05/17/22 0835 05/17/22 1801 05/18/22 0433 05/19/22 0527 05/20/22 0529  WBC  18.8*  --  22.8* 15.3* 9.5  NEUTROABS 15.6*  --   --  11.5* 6.8  HGB 21.6* 18.4* 18.4* 16.8 14.5  HCT 58.3* 54.0* 51.9 48.1 42.1  MCV 85.4  --  88.3 89.6 89.8  PLT 386  --  327 288 161   Basic Metabolic Panel: Recent Labs  Lab 05/17/22 0835 05/17/22 1801 05/18/22 0433 05/19/22 0527 05/20/22 0529  NA 135 135  134* 140 133* 137  K 3.5 3.6  3.7 3.5 3.7 3.3*  CL 92* 93*  95* 97* 91* 99  CO2 21* 21* 28 32 31  GLUCOSE 348* 371*  389* 126* 139* 131*  BUN 37* 50*  45* 54* 34* 19  CREATININE 3.20* 3.95*  3.80* 2.47* 1.21 0.91  CALCIUM 9.9 9.0 8.5* 8.5* 8.3*   GFR: Estimated Creatinine Clearance: 110.7 mL/min (by C-G formula based on SCr of 0.91 mg/dL). Liver Function Tests: Recent Labs  Lab 05/17/22 0835 05/18/22 0433 05/18/22 0657 05/19/22 0527 05/20/22 0529  AST 24 16  --  22 17  ALT 42 28  --  29 25  ALKPHOS 233* 172*  --  158* 128*  BILITOT 0.8 1.1 1.0 1.6* 1.3*  PROT 9.0* 8.1  --  7.1 6.0*  ALBUMIN 4.8 4.1  --  3.9 3.3*      Code Status:  Full code.  Code status decision has been confirmed with: patient Family Communication: dererred    Severity of Illness:  The appropriate patient status for this patient is INPATIENT. Inpatient status is judged to be reasonable and necessary in order to provide the required intensity of service to ensure the patient's safety. The patient's presenting symptoms, physical exam findings, and initial radiographic and laboratory data in the context of their chronic comorbidities is felt to place them at high risk for further clinical deterioration. Furthermore, it is not anticipated that the patient will be medically stable for discharge from the hospital within 2 midnights of admission.   * I certify that at the point of admission it is my clinical judgment that the patient will require inpatient hospital care spanning beyond 2 midnights from the point of admission due to high intensity of service, high risk for further deterioration  and high frequency of surveillance required.*  Time spent:  50 minutes  Author:  Vernelle Emerald MD  05/20/2022 10:01 PM

## 2022-05-21 DIAGNOSIS — K8050 Calculus of bile duct without cholangitis or cholecystitis without obstruction: Secondary | ICD-10-CM

## 2022-05-21 DIAGNOSIS — K8500 Idiopathic acute pancreatitis without necrosis or infection: Secondary | ICD-10-CM | POA: Diagnosis not present

## 2022-05-21 DIAGNOSIS — K863 Pseudocyst of pancreas: Secondary | ICD-10-CM | POA: Diagnosis not present

## 2022-05-21 DIAGNOSIS — N179 Acute kidney failure, unspecified: Secondary | ICD-10-CM | POA: Diagnosis not present

## 2022-05-21 DIAGNOSIS — K8681 Exocrine pancreatic insufficiency: Secondary | ICD-10-CM | POA: Diagnosis not present

## 2022-05-21 LAB — COMPREHENSIVE METABOLIC PANEL
ALT: 25 U/L (ref 0–44)
AST: 16 U/L (ref 15–41)
Albumin: 3.4 g/dL — ABNORMAL LOW (ref 3.5–5.0)
Alkaline Phosphatase: 128 U/L — ABNORMAL HIGH (ref 38–126)
Anion gap: 3 — ABNORMAL LOW (ref 5–15)
BUN: 13 mg/dL (ref 6–20)
CO2: 33 mmol/L — ABNORMAL HIGH (ref 22–32)
Calcium: 8.8 mg/dL — ABNORMAL LOW (ref 8.9–10.3)
Chloride: 102 mmol/L (ref 98–111)
Creatinine, Ser: 0.91 mg/dL (ref 0.61–1.24)
GFR, Estimated: >60 mL/min (ref >60)
Glucose, Bld: 137 mg/dL — ABNORMAL HIGH (ref 70–99)
Potassium: 4.4 mmol/L (ref 3.5–5.1)
Sodium: 138 mmol/L (ref 135–145)
Total Bilirubin: 1 mg/dL (ref 0.3–1.2)
Total Protein: 6 g/dL — ABNORMAL LOW (ref 6.5–8.1)

## 2022-05-21 LAB — CBC WITH DIFFERENTIAL/PLATELET
Abs Immature Granulocytes: 0.04 10*3/uL (ref 0.00–0.07)
Basophils Absolute: 0.1 10*3/uL (ref 0.0–0.1)
Basophils Relative: 1 %
Eosinophils Absolute: 0.2 10*3/uL (ref 0.0–0.5)
Eosinophils Relative: 3 %
HCT: 42.6 % (ref 39.0–52.0)
Hemoglobin: 14.7 g/dL (ref 13.0–17.0)
Immature Granulocytes: 1 %
Lymphocytes Relative: 22 %
Lymphs Abs: 1.7 10*3/uL (ref 0.7–4.0)
MCH: 31.4 pg (ref 26.0–34.0)
MCHC: 34.5 g/dL (ref 30.0–36.0)
MCV: 91 fL (ref 80.0–100.0)
Monocytes Absolute: 0.7 10*3/uL (ref 0.1–1.0)
Monocytes Relative: 9 %
Neutro Abs: 4.8 10*3/uL (ref 1.7–7.7)
Neutrophils Relative %: 64 %
Platelets: 233 10*3/uL (ref 150–400)
RBC: 4.68 MIL/uL (ref 4.22–5.81)
RDW: 12.1 % (ref 11.5–15.5)
WBC: 7.6 10*3/uL (ref 4.0–10.5)
nRBC: 0 % (ref 0.0–0.2)

## 2022-05-21 LAB — SEDIMENTATION RATE: Sed Rate: 8 mm/hr (ref 0–16)

## 2022-05-21 LAB — GLUCOSE, CAPILLARY
Glucose-Capillary: 106 mg/dL — ABNORMAL HIGH (ref 70–99)
Glucose-Capillary: 103 mg/dL — ABNORMAL HIGH (ref 70–99)
Glucose-Capillary: 86 mg/dL (ref 70–99)
Glucose-Capillary: 142 mg/dL — ABNORMAL HIGH (ref 70–99)

## 2022-05-21 LAB — LIPASE, BLOOD: Lipase: 238 U/L — ABNORMAL HIGH (ref 11–51)

## 2022-05-21 LAB — C-REACTIVE PROTEIN: CRP: 3.2 mg/dL — ABNORMAL HIGH (ref <1.0)

## 2022-05-21 MED ORDER — PANCRELIPASE (LIP-PROT-AMYL) 12000-38000 UNITS PO CPEP: 36000.0000 [IU] | ORAL_CAPSULE | Freq: Three times a day (TID) | ORAL | Status: DC | PRN

## 2022-05-21 NOTE — TOC Progression Note (Signed)
Transition of Care [facility]) - Progression Note    Patient Details  Name: Steve Romero MRN: [id number] Date of Birth: 05/27/1971  Transition of Care [facility]) CM/SW [place], RN Phone Number:[phone]  05/21/2022, 2:37 PM  Clinical Narrative:     Transition of Care (TOC) Screening Note   Patient Details  Name: Steve Romero Date of Birth: [birth year]   Transition of Care [facility]) CM/SW Contact:    Angelita Ingles, RN Phone Number: 05/21/2022, 2:37 PM    Transition of Care Department [facility]) has reviewed patient and no TOC needs have been identified at this time. We will continue to monitor patient advancement through interdisciplinary progression rounds. If new patient transition needs arise, please place a TOC consult.          Expected Discharge Plan and Services                                                 Social Determinants of Health (SDOH) Interventions    Readmission Risk Interventions     No data to display

## 2022-05-21 NOTE — Progress Notes (Signed)
PROGRESS NOTE   Steve Romero Romero  XFG:[id number] DOB: [birth year] DOA: 05/17/2022 PCP: Steve Romero Norlander, DO   Date of Service: the patient was seen and examined on 05/21/2022  Brief Narrative:  51 y.o. 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    Patient now presents with several days of nausea vomiting so [facility] emergency [facility] on 11/12.  Upon evaluation patient was found to have acute pancreatitis with pseudocyst formation.  Presentation complicated by acute kidney injury as well as new diagnosis of diabetes.  Possible prescription was then called to assess the patient for admission to the hospital.    Patient was initially managed with a regimen of intravenous empiric cefepime and Flagylwhich has since been discontinued.  Acute pancreatitis further evaluated by MRCP showing recurrent CBD stone.  Gastroenterology is consulted with plans to undergo repeat ERCP.  Acute kidney injury successfully managed with intravenous volume resuscitation.  Patient's substantial hyperglycemia was further evaluated revealing hemoglobin A1c of 6.3% and spontaneously downtrending hyperglycemia suggesting the patient was simply suffering from hyperglycemia secondary to acute pancreatitis in the setting of prediabetes and not true diabetes mellitus.    Assessment and Plan: * Acute pancreatitis MRCP performed 11/14 once again demonstrates acute pancreatitis with dominant 4.7 cm pseudocyst Possible CBD stone versus debris on MRCP Dr. Rush Romero with gastroenterology planning to take patient for ERCP, likely on 11/17 Clear liquid diet for now with n.p.o. after midnight Gentle intravenous hydration Continue as needed opiate-based analgesics Associated leukocytosis has resolved Empiric antibiotic therapy discontinued with  negative cultures with normal procalcitonin   Pseudocyst, pancreas Please see assessment and plan above  AKI (acute kidney injury) ([place]) Baseline creatinine of 1.  Creatinine of 3.2 on admission with peak of 3.95.  Improved with IV fluids   Exocrine pancreatic insufficiency Continue Creon  Essential hypertension Blood pressures improved Continue hydralazine PRN  Hyperglycemia initial blood sugars well over 300 on admission Hyperglycemia has spontaneously improved without basal insulin therapy  Hemoglobin A1C of 6.3% more suggestive of prediabetes rather than true diabetes mellitus Patient likely suffered from hyperglycemia in the setting of acute pancreatitis and underlying prediabetes Continue SSI      Subjective:  Patient still complaining of abdominal pain, epigastric in location, moderate to severe in intensity, sharp in quality, worse with as needed opiate-based analgesics without significant change compared to yesterday.  Patient is also complaining of  associated difficulty with tolerating oral intake.  Physical Exam:  Vitals:   05/20/22 1331 05/20/22 2004 05/21/22 0400 05/21/22 1405  BP: (!) 140/80 122/87 (!) 144/85 (!) 127/93  Pulse: 72 68 62 61  Resp: '16 18 18 20  '$ Temp: 98 F (36.7 C) 98.4 F (36.9 C) 97.8 F (36.6 C) 98.2 F (36.8 C)  TempSrc: Oral Oral Oral Oral  SpO2: 97% 96% 97% 99%  Weight:      Height:         Constitutional: Awake alert and oriented x3, no associated distress.   Skin: no rashes, no lesions, good skin turgor noted. Eyes: Pupils are equally reactive to light.  No evidence of scleral icterus or conjunctival pallor.  ENMT: Moist mucous membranes noted.  Posterior pharynx clear of any exudate or lesions.   Respiratory: clear to auscultation bilaterally, no wheezing, no crackles. Normal respiratory effort. No accessory muscle use.  Cardiovascular: Regular rate and rhythm, no murmurs / rubs /  gallops. No extremity edema. 2+ pedal  pulses. No carotid bruits.  Abdomen: Abdomen is generally tender, worst in the epigastric region.  Abdomen is soft.  No evidence of intra-abdominal masses.  Positive bowel sounds noted in all quadrants.   Musculoskeletal: No joint deformity upper and lower extremities. Good ROM, no contractures. Normal muscle tone.    Data Reviewed:  I have personally reviewed and interpreted labs, imaging.  Significant findings are   CBC: Recent Labs  Lab 05/17/22 0835 05/17/22 1801 05/18/22 0433 05/19/22 0527 05/20/22 0529 05/21/22 0559  WBC 18.8*  --  22.8* 15.3* 9.5 7.6  NEUTROABS 15.6*  --   --  11.5* 6.8 4.8  HGB 21.6* 18.4* 18.4* 16.8 14.5 14.7  HCT 58.3* 54.0* 51.9 48.1 42.1 42.6  MCV 85.4  --  88.3 89.6 89.8 91.0  PLT 386  --  327 288 222 881   Basic Metabolic Panel: Recent Labs  Lab 05/17/22 1801 05/18/22 0433 05/19/22 0527 05/20/22 0529 05/21/22 0559  NA 135  134* 140 133* 137 138  K 3.6  3.7 3.5 3.7 3.3* 4.4  CL 93*  95* 97* 91* 99 102  CO2 21* 28 32 31 33*  GLUCOSE 371*  389* 126* 139* 131* 137*  BUN 50*  45* 54* 34* 19 13  CREATININE 3.95*  3.80* 2.47* 1.21 0.91 0.91  CALCIUM 9.0 8.5* 8.5* 8.3* 8.8*   GFR: Estimated Creatinine Clearance: 110.7 mL/min (by C-G formula based on SCr of 0.91 mg/dL). Liver Function Tests: Recent Labs  Lab 05/17/22 0835 05/18/22 0433 05/18/22 0657 05/19/22 0527 05/20/22 0529 05/21/22 0559  AST 24 16  --  '22 17 16  '$ ALT 42 28  --  '29 25 25  '$ ALKPHOS 233* 172*  --  158* 128* 128*  BILITOT 0.8 1.1 1.0 1.6* 1.3* 1.0  PROT 9.0* 8.1  --  7.1 6.0* 6.0*  ALBUMIN 4.8 4.1  --  3.9 3.3* 3.4*      Code Status:  Full code.  Code status decision has been confirmed with: patient Family Communication: dererred    Severity of Illness:  The appropriate patient status for this patient is INPATIENT. Inpatient status is judged to be reasonable and necessary in order to provide the required intensity of service to ensure the patient's  safety. The patient's presenting symptoms, physical exam findings, and initial radiographic and laboratory data in the context of their chronic comorbidities is felt to place them at high risk for further clinical deterioration. Furthermore, it is not anticipated that the patient will be medically stable for discharge from the hospital within 2 midnights of admission.   * I certify that at the point of admission it is my clinical judgment that the patient will require inpatient hospital care spanning beyond 2 midnights from the point of admission due to high intensity of service, high risk for further deterioration and high frequency of surveillance required.*  Time spent:  50 minutes  Author:  Vernelle Emerald MD  05/21/2022 7:33 PM

## 2022-05-21 NOTE — Progress Notes (Signed)
Gastroenterology Inpatient Follow-up Note   PATIENT IDENTIFICATION  Steve Romero is a 51 y.o. male with a pmh significant for prior severe necrotizing pancreatitis (status post necrosectomy's) who now has diabetes and recurrent pancreatitis and pancreatic cyst development. Hospital Day: 5  SUBJECTIVE  Patient was n.p.o. for the possibility of an EUS/ERCP today but unfortunately due to anesthesia availability we cannot accommodate this today. The patient denies fevers or chills. He still is experiencing abdominal discomfort but no worse than yesterday.   OBJECTIVE  Scheduled Inpatient Medications:   B-complex with vitamin C  1 tablet Oral QHS   enoxaparin (LOVENOX) injection  40 mg Subcutaneous Q24H   insulin aspart  0-15 Units Subcutaneous TID AC & HS   lipase/protease/amylase  72,000 Units Oral TID WC   LORazepam  0.5 mg Intravenous Once   multivitamin with minerals  1 tablet Oral Daily   pantoprazole (PROTONIX) IV  40 mg Intravenous Q24H   Ensure Max Protein  11 oz Oral BID   Continuous Inpatient Infusions:   lactated ringers 75 mL/hr at 05/21/22 1539   PRN Inpatient Medications: acetaminophen **OR** acetaminophen, hydrALAZINE, HYDROmorphone (DILAUDID) injection, lipase/protease/amylase, ondansetron (ZOFRAN) IV, oxyCODONE   Physical Examination  Temp:  [97.8 F (36.6 C)-98.4 F (36.9 C)] 98.2 F (36.8 C) (11/16 1405) Pulse Rate:  [61-68] 61 (11/16 1405) Resp:  [18-20] 20 (11/16 1405) BP: (122-144)/(85-93) 127/93 (11/16 1405) SpO2:  [96 %-99 %] 99 % (11/16 1405) Temp (24hrs), Avg:98.1 F (36.7 C), Min:97.8 F (36.6 C), Max:98.4 F (36.9 C)  Weight: 94.3 kg GEN: NAD PSYCH: Cooperative, without pressured speech EYE: Conjunctivae pink, sclerae anicteric ENT: MMM CV: Nontachycardic RESP: No audible wheezing GI: NABS, soft, protuberant abdomen, tenderness to palpation in mid epigastrium, no rebound,  MSK/EXT: No lower extremity edema SKIN: No jaundice NEURO:   Alert & Oriented x 3, no focal deficits   Review of Data   Laboratory Studies   Recent Labs  Lab 05/21/22 0559  NA 138  K 4.4  CL 102  CO2 33*  BUN 13  CREATININE 0.91  GLUCOSE 137*  CALCIUM 8.8*   Recent Labs  Lab 05/21/22 0559  AST 16  ALT 25  ALKPHOS 128*    Recent Labs  Lab 05/19/22 0527 05/20/22 0529 05/21/22 0559  WBC 15.3* 9.5 7.6  HGB 16.8 14.5 14.7  HCT 48.1 42.1 42.6  PLT 288 222 233   No results for input(s): "APTT", "INR" in the last 168 hours.  Imaging Studies  MR ABDOMEN MRCP W WO CONTAST  Result Date: 05/19/2022 CLINICAL DATA:  Acute pancreatitis EXAM: MRI ABDOMEN WITHOUT AND WITH CONTRAST (INCLUDING MRCP) TECHNIQUE: Multiplanar multisequence MR imaging of the abdomen was performed both before and after the administration of intravenous contrast. Heavily T2-weighted images of the biliary and pancreatic ducts were obtained, and three-dimensional MRCP images were rendered by post processing. CONTRAST:  31m GADAVIST GADOBUTROL 1 MMOL/ML IV SOLN COMPARISON:  CT abdomen/pelvis dated 05/17/2022 FINDINGS: Severely motion degraded images. Lower chest: Lung bases are clear. Hepatobiliary: Liver is within normal limits. No suspicious/enhancing hepatic lesions. No definite hepatic steatosis. Status post cholecystectomy. Mild intrahepatic ductal dilatation in the central left hepatic lobe (series 5/image 14). Dilated common duct, measuring 10 mm centrally. Although motion degraded, there is suspected choledocholithiasis versus debris in the distal CBD, measuring up to 13 mm (series 4/image 17). Pancreas: Peripancreatic inflammatory changes in this patient with known acute pancreatitis. Dominant 4.7 cm pseudocyst in the pancreatic body (series 5/image 19). Adjacent pseudocysts versus  segmental ductal dilatation (series 5/image 19) in the proximal pancreatic tail. Segmental parenchymal atrophy in the distal pancreatic tail. Spleen:  Within normal limits. Adrenals/Urinary  Tract:  Adrenal glands are within normal limits. Kidneys are within normal limits.  No hydronephrosis. Stomach/Bowel: Stomach is within normal limits. Visualized bowel is grossly unremarkable. Vascular/Lymphatic:  No evidence of abdominal aortic aneurysm. No suspicious abdominal lymphadenopathy. Other:  No abdominal ascites. Musculoskeletal: No focal osseous lesions. IMPRESSION: Severely motion degraded images, limiting evaluation. Acute pancreatitis with dominant 4.7 cm pseudocyst, as above. Status post cholecystectomy. Mild intrahepatic and extrahepatic ductal dilatation. Common duct measures 10 mm. Suspected choledocholithiasis with 13 mm distal CBD stone (versus debris), as above. Consider ERCP as clinically warranted. Electronically Signed   By: Julian Hy M.D.   On: 05/19/2022 20:36   MR 3D Recon At Scanner  Result Date: 05/19/2022 CLINICAL DATA:  Acute pancreatitis EXAM: MRI ABDOMEN WITHOUT AND WITH CONTRAST (INCLUDING MRCP) TECHNIQUE: Multiplanar multisequence MR imaging of the abdomen was performed both before and after the administration of intravenous contrast. Heavily T2-weighted images of the biliary and pancreatic ducts were obtained, and three-dimensional MRCP images were rendered by post processing. CONTRAST:  66m GADAVIST GADOBUTROL 1 MMOL/ML IV SOLN COMPARISON:  CT abdomen/pelvis dated 05/17/2022 FINDINGS: Severely motion degraded images. Lower chest: Lung bases are clear. Hepatobiliary: Liver is within normal limits. No suspicious/enhancing hepatic lesions. No definite hepatic steatosis. Status post cholecystectomy. Mild intrahepatic ductal dilatation in the central left hepatic lobe (series 5/image 14). Dilated common duct, measuring 10 mm centrally. Although motion degraded, there is suspected choledocholithiasis versus debris in the distal CBD, measuring up to 13 mm (series 4/image 17). Pancreas: Peripancreatic inflammatory changes in this patient with known acute pancreatitis.  Dominant 4.7 cm pseudocyst in the pancreatic body (series 5/image 19). Adjacent pseudocysts versus segmental ductal dilatation (series 5/image 19) in the proximal pancreatic tail. Segmental parenchymal atrophy in the distal pancreatic tail. Spleen:  Within normal limits. Adrenals/Urinary Tract:  Adrenal glands are within normal limits. Kidneys are within normal limits.  No hydronephrosis. Stomach/Bowel: Stomach is within normal limits. Visualized bowel is grossly unremarkable. Vascular/Lymphatic:  No evidence of abdominal aortic aneurysm. No suspicious abdominal lymphadenopathy. Other:  No abdominal ascites. Musculoskeletal: No focal osseous lesions. IMPRESSION: Severely motion degraded images, limiting evaluation. Acute pancreatitis with dominant 4.7 cm pseudocyst, as above. Status post cholecystectomy. Mild intrahepatic and extrahepatic ductal dilatation. Common duct measures 10 mm. Suspected choledocholithiasis with 13 mm distal CBD stone (versus debris), as above. Consider ERCP as clinically warranted. Electronically Signed   By: SJulian HyM.D.   On: 05/19/2022 20:36    GI Procedures and Studies  No new imaging studies to review   ASSESSMENT  Mr. BChiarelliis a 51y.o. male with a pmh significant for prior severe necrotizing pancreatitis (status post necrosectomy's) who now has diabetes and recurrent pancreatitis and pancreatic cyst development.  Patient is hemodynamically stable.  Clinically he seems to be slightly better than yesterday.  Unfortunately we could not accommodate an EUS/ERCP today but hopefully we will have possible anesthesia availability tomorrow.  Goal will be for attempt at biliary cannulation and cleanout, cyst aspiration, and attempt at pancreatic duct manipulation with stenting if able to traverse the previously not traversed pancreas duct due to concern of pancreatic duct disruption.  If we are not successful, will need to talk with hepatobiliary surgery about role of  pancreatic bypass or Whipple in the future.  Hopefully that will not come to par.  The risks and  benefits of endoscopic evaluation were discussed with the patient; these include but are not limited to the risk of perforation, infection, bleeding, missed lesions, lack of diagnosis, severe illness requiring hospitalization, as well as anesthesia and sedation related illnesses.  The patient and/or family is agreeable to proceed.  The risks of an ERCP were discussed at length, including but not limited to the risk of perforation, bleeding, abdominal pain, post-ERCP pancreatitis (while usually mild can be severe and even life threatening).  All patient questions were answered to the best of my ability, and the patient agrees to the aforementioned plan of action with follow-up as indicated.   PLAN/RECOMMENDATIONS  Okay for clear liquid diet currently N.p.o. at midnight Repeat attempt to try to have anesthesia availability for EUS/ERCP on 11/17 Appreciate medicine service with their great care in setting of new diagnosis of diabetes Adjusted Creon dosing    Please page/call with questions or concerns.   Justice Britain, MD [facility] Office # [phone]    LOS: 4 days  Irving Copas  05/21/2022, 4:54 PM

## 2022-05-21 NOTE — H&P (View-Only) (Signed)
Gastroenterology Inpatient Follow-up Note   PATIENT IDENTIFICATION  Steve Romero is a 51 y.o. male with a pmh significant for prior severe necrotizing pancreatitis (status post necrosectomy's) who now has diabetes and recurrent pancreatitis and pancreatic cyst development. Hospital Day: 5  SUBJECTIVE  Patient was n.p.o. for the possibility of an EUS/ERCP today but unfortunately due to anesthesia availability we cannot accommodate this today. The patient denies fevers or chills. He still is experiencing abdominal discomfort but no worse than yesterday.   OBJECTIVE  Scheduled Inpatient Medications:   B-complex with vitamin C  1 tablet Oral QHS   enoxaparin (LOVENOX) injection  40 mg Subcutaneous Q24H   insulin aspart  0-15 Units Subcutaneous TID AC & HS   lipase/protease/amylase  72,000 Units Oral TID WC   LORazepam  0.5 mg Intravenous Once   multivitamin with minerals  1 tablet Oral Daily   pantoprazole (PROTONIX) IV  40 mg Intravenous Q24H   Ensure Max Protein  11 oz Oral BID   Continuous Inpatient Infusions:   lactated ringers 75 mL/hr at 05/21/22 1539   PRN Inpatient Medications: acetaminophen **OR** acetaminophen, hydrALAZINE, HYDROmorphone (DILAUDID) injection, lipase/protease/amylase, ondansetron (ZOFRAN) IV, oxyCODONE   Physical Examination  Temp:  [97.8 F (36.6 C)-98.4 F (36.9 C)] 98.2 F (36.8 C) (11/16 1405) Pulse Rate:  [61-68] 61 (11/16 1405) Resp:  [18-20] 20 (11/16 1405) BP: (122-144)/(85-93) 127/93 (11/16 1405) SpO2:  [96 %-99 %] 99 % (11/16 1405) Temp (24hrs), Avg:98.1 F (36.7 C), Min:97.8 F (36.6 C), Max:98.4 F (36.9 C)  Weight: 94.3 kg GEN: NAD PSYCH: Cooperative, without pressured speech EYE: Conjunctivae pink, sclerae anicteric ENT: MMM CV: Nontachycardic RESP: No audible wheezing GI: NABS, soft, protuberant abdomen, tenderness to palpation in mid epigastrium, no rebound,  MSK/EXT: No lower extremity edema SKIN: No jaundice NEURO:   Alert & Oriented x 3, no focal deficits   Review of Data   Laboratory Studies   Recent Labs  Lab 05/21/22 0559  NA 138  K 4.4  CL 102  CO2 33*  BUN 13  CREATININE 0.91  GLUCOSE 137*  CALCIUM 8.8*   Recent Labs  Lab 05/21/22 0559  AST 16  ALT 25  ALKPHOS 128*    Recent Labs  Lab 05/19/22 0527 05/20/22 0529 05/21/22 0559  WBC 15.3* 9.5 7.6  HGB 16.8 14.5 14.7  HCT 48.1 42.1 42.6  PLT 288 222 233   No results for input(s): "APTT", "INR" in the last 168 hours.  Imaging Studies  MR ABDOMEN MRCP W WO CONTAST  Result Date: 05/19/2022 CLINICAL DATA:  Acute pancreatitis EXAM: MRI ABDOMEN WITHOUT AND WITH CONTRAST (INCLUDING MRCP) TECHNIQUE: Multiplanar multisequence MR imaging of the abdomen was performed both before and after the administration of intravenous contrast. Heavily T2-weighted images of the biliary and pancreatic ducts were obtained, and three-dimensional MRCP images were rendered by post processing. CONTRAST:  60m GADAVIST GADOBUTROL 1 MMOL/ML IV SOLN COMPARISON:  CT abdomen/pelvis dated 05/17/2022 FINDINGS: Severely motion degraded images. Lower chest: Lung bases are clear. Hepatobiliary: Liver is within normal limits. No suspicious/enhancing hepatic lesions. No definite hepatic steatosis. Status post cholecystectomy. Mild intrahepatic ductal dilatation in the central left hepatic lobe (series 5/image 14). Dilated common duct, measuring 10 mm centrally. Although motion degraded, there is suspected choledocholithiasis versus debris in the distal CBD, measuring up to 13 mm (series 4/image 17). Pancreas: Peripancreatic inflammatory changes in this patient with known acute pancreatitis. Dominant 4.7 cm pseudocyst in the pancreatic body (series 5/image 19). Adjacent pseudocysts versus  segmental ductal dilatation (series 5/image 19) in the proximal pancreatic tail. Segmental parenchymal atrophy in the distal pancreatic tail. Spleen:  Within normal limits. Adrenals/Urinary  Tract:  Adrenal glands are within normal limits. Kidneys are within normal limits.  No hydronephrosis. Stomach/Bowel: Stomach is within normal limits. Visualized bowel is grossly unremarkable. Vascular/Lymphatic:  No evidence of abdominal aortic aneurysm. No suspicious abdominal lymphadenopathy. Other:  No abdominal ascites. Musculoskeletal: No focal osseous lesions. IMPRESSION: Severely motion degraded images, limiting evaluation. Acute pancreatitis with dominant 4.7 cm pseudocyst, as above. Status post cholecystectomy. Mild intrahepatic and extrahepatic ductal dilatation. Common duct measures 10 mm. Suspected choledocholithiasis with 13 mm distal CBD stone (versus debris), as above. Consider ERCP as clinically warranted. Electronically Signed   By: Julian Hy M.D.   On: 05/19/2022 20:36   MR 3D Recon At Scanner  Result Date: 05/19/2022 CLINICAL DATA:  Acute pancreatitis EXAM: MRI ABDOMEN WITHOUT AND WITH CONTRAST (INCLUDING MRCP) TECHNIQUE: Multiplanar multisequence MR imaging of the abdomen was performed both before and after the administration of intravenous contrast. Heavily T2-weighted images of the biliary and pancreatic ducts were obtained, and three-dimensional MRCP images were rendered by post processing. CONTRAST:  40m GADAVIST GADOBUTROL 1 MMOL/ML IV SOLN COMPARISON:  CT abdomen/pelvis dated 05/17/2022 FINDINGS: Severely motion degraded images. Lower chest: Lung bases are clear. Hepatobiliary: Liver is within normal limits. No suspicious/enhancing hepatic lesions. No definite hepatic steatosis. Status post cholecystectomy. Mild intrahepatic ductal dilatation in the central left hepatic lobe (series 5/image 14). Dilated common duct, measuring 10 mm centrally. Although motion degraded, there is suspected choledocholithiasis versus debris in the distal CBD, measuring up to 13 mm (series 4/image 17). Pancreas: Peripancreatic inflammatory changes in this patient with known acute pancreatitis.  Dominant 4.7 cm pseudocyst in the pancreatic body (series 5/image 19). Adjacent pseudocysts versus segmental ductal dilatation (series 5/image 19) in the proximal pancreatic tail. Segmental parenchymal atrophy in the distal pancreatic tail. Spleen:  Within normal limits. Adrenals/Urinary Tract:  Adrenal glands are within normal limits. Kidneys are within normal limits.  No hydronephrosis. Stomach/Bowel: Stomach is within normal limits. Visualized bowel is grossly unremarkable. Vascular/Lymphatic:  No evidence of abdominal aortic aneurysm. No suspicious abdominal lymphadenopathy. Other:  No abdominal ascites. Musculoskeletal: No focal osseous lesions. IMPRESSION: Severely motion degraded images, limiting evaluation. Acute pancreatitis with dominant 4.7 cm pseudocyst, as above. Status post cholecystectomy. Mild intrahepatic and extrahepatic ductal dilatation. Common duct measures 10 mm. Suspected choledocholithiasis with 13 mm distal CBD stone (versus debris), as above. Consider ERCP as clinically warranted. Electronically Signed   By: SJulian HyM.D.   On: 05/19/2022 20:36    GI Procedures and Studies  No new imaging studies to review   ASSESSMENT  Mr. BCaminois a 51y.o. male with a pmh significant for prior severe necrotizing pancreatitis (status post necrosectomy's) who now has diabetes and recurrent pancreatitis and pancreatic cyst development.  Patient is hemodynamically stable.  Clinically he seems to be slightly better than yesterday.  Unfortunately we could not accommodate an EUS/ERCP today but hopefully we will have possible anesthesia availability tomorrow.  Goal will be for attempt at biliary cannulation and cleanout, cyst aspiration, and attempt at pancreatic duct manipulation with stenting if able to traverse the previously not traversed pancreas duct due to concern of pancreatic duct disruption.  If we are not successful, will need to talk with hepatobiliary surgery about role of  pancreatic bypass or Whipple in the future.  Hopefully that will not come to par.  The risks and  benefits of endoscopic evaluation were discussed with the patient; these include but are not limited to the risk of perforation, infection, bleeding, missed lesions, lack of diagnosis, severe illness requiring hospitalization, as well as anesthesia and sedation related illnesses.  The patient and/or family is agreeable to proceed.  The risks of an ERCP were discussed at length, including but not limited to the risk of perforation, bleeding, abdominal pain, post-ERCP pancreatitis (while usually mild can be severe and even life threatening).  All patient questions were answered to the best of my ability, and the patient agrees to the aforementioned plan of action with follow-up as indicated.   PLAN/RECOMMENDATIONS  Okay for clear liquid diet currently N.p.o. at midnight Repeat attempt to try to have anesthesia availability for EUS/ERCP on 11/17 Appreciate medicine service with their great care in setting of new diagnosis of diabetes Adjusted Creon dosing    Please page/call with questions or concerns.   Justice Britain, MD [facility] Office # [phone]    LOS: 4 days  Irving Copas  05/21/2022, 4:54 PM

## 2022-05-22 ENCOUNTER — Inpatient Hospital Stay (HOSPITAL_COMMUNITY): Payer: Medicaid Other | Admitting: Certified Registered"

## 2022-05-22 ENCOUNTER — Inpatient Hospital Stay (HOSPITAL_COMMUNITY): Payer: Medicaid Other

## 2022-05-22 ENCOUNTER — Encounter (HOSPITAL_COMMUNITY): Payer: Self-pay | Admitting: Family Medicine

## 2022-05-22 ENCOUNTER — Encounter (HOSPITAL_COMMUNITY)
Admission: EM | Disposition: A | Discharge status: Home / Self Care | Payer: Self-pay | Source: Home / Self Care | Attending: Internal Medicine

## 2022-05-22 DIAGNOSIS — N179 Acute kidney failure, unspecified: Secondary | ICD-10-CM | POA: Diagnosis not present

## 2022-05-22 DIAGNOSIS — J449 Chronic obstructive pulmonary disease, unspecified: Secondary | ICD-10-CM

## 2022-05-22 DIAGNOSIS — I899 Noninfective disorder of lymphatic vessels and lymph nodes, unspecified: Secondary | ICD-10-CM

## 2022-05-22 DIAGNOSIS — K2289 Other specified disease of esophagus: Secondary | ICD-10-CM

## 2022-05-22 DIAGNOSIS — K3189 Other diseases of stomach and duodenum: Secondary | ICD-10-CM

## 2022-05-22 DIAGNOSIS — K8681 Exocrine pancreatic insufficiency: Secondary | ICD-10-CM | POA: Diagnosis not present

## 2022-05-22 DIAGNOSIS — K863 Pseudocyst of pancreas: Secondary | ICD-10-CM | POA: Diagnosis not present

## 2022-05-22 DIAGNOSIS — K859 Acute pancreatitis without necrosis or infection, unspecified: Secondary | ICD-10-CM

## 2022-05-22 DIAGNOSIS — K838 Other specified diseases of biliary tract: Secondary | ICD-10-CM | POA: Diagnosis not present

## 2022-05-22 DIAGNOSIS — K862 Cyst of pancreas: Secondary | ICD-10-CM

## 2022-05-22 DIAGNOSIS — F17210 Nicotine dependence, cigarettes, uncomplicated: Secondary | ICD-10-CM

## 2022-05-22 HISTORY — PX: ESOPHAGOGASTRODUODENOSCOPY (EGD) WITH PROPOFOL: SHX5813

## 2022-05-22 HISTORY — PX: FINE NEEDLE ASPIRATION: SHX6590

## 2022-05-22 HISTORY — PX: EUS: SHX5427

## 2022-05-22 HISTORY — PX: ERCP: SHX5425

## 2022-05-22 HISTORY — PX: BILIARY DILATION: SHX6850

## 2022-05-22 HISTORY — PX: REMOVAL OF STONES: SHX5545

## 2022-05-22 LAB — CBC WITH DIFFERENTIAL/PLATELET
Abs Immature Granulocytes: 0.07 10*3/uL (ref 0.00–0.07)
Basophils Absolute: 0.1 10*3/uL (ref 0.0–0.1)
Basophils Relative: 1 %
Eosinophils Absolute: 0.3 10*3/uL (ref 0.0–0.5)
Eosinophils Relative: 4 %
HCT: 43.6 % (ref 39.0–52.0)
Hemoglobin: 14.7 g/dL (ref 13.0–17.0)
Immature Granulocytes: 1 %
Lymphocytes Relative: 23 %
Lymphs Abs: 1.9 10*3/uL (ref 0.7–4.0)
MCH: 30.8 pg (ref 26.0–34.0)
MCHC: 33.7 g/dL (ref 30.0–36.0)
MCV: 91.4 fL (ref 80.0–100.0)
Monocytes Absolute: 0.7 10*3/uL (ref 0.1–1.0)
Monocytes Relative: 8 %
Neutro Abs: 5.2 10*3/uL (ref 1.7–7.7)
Neutrophils Relative %: 63 %
Platelets: 256 10*3/uL (ref 150–400)
RBC: 4.77 MIL/uL (ref 4.22–5.81)
RDW: 12 % (ref 11.5–15.5)
WBC: 8.2 10*3/uL (ref 4.0–10.5)
nRBC: 0 % (ref 0.0–0.2)

## 2022-05-22 LAB — COMPREHENSIVE METABOLIC PANEL
ALT: 25 U/L (ref 0–44)
AST: 19 U/L (ref 15–41)
Albumin: 3.4 g/dL — ABNORMAL LOW (ref 3.5–5.0)
Alkaline Phosphatase: 126 U/L (ref 38–126)
Anion gap: 7 (ref 5–15)
BUN: 13 mg/dL (ref 6–20)
CO2: 28 mmol/L (ref 22–32)
Calcium: 9 mg/dL (ref 8.9–10.3)
Chloride: 103 mmol/L (ref 98–111)
Creatinine, Ser: 0.75 mg/dL (ref 0.61–1.24)
GFR, Estimated: >60 mL/min (ref >60)
Glucose, Bld: 127 mg/dL — ABNORMAL HIGH (ref 70–99)
Potassium: 4.1 mmol/L (ref 3.5–5.1)
Sodium: 138 mmol/L (ref 135–145)
Total Bilirubin: 0.7 mg/dL (ref 0.3–1.2)
Total Protein: 6.3 g/dL — ABNORMAL LOW (ref 6.5–8.1)

## 2022-05-22 LAB — GLUCOSE, CAPILLARY
Glucose-Capillary: 123 mg/dL — ABNORMAL HIGH (ref 70–99)
Glucose-Capillary: 99 mg/dL (ref 70–99)
Glucose-Capillary: 144 mg/dL — ABNORMAL HIGH (ref 70–99)
Glucose-Capillary: 299 mg/dL — ABNORMAL HIGH (ref 70–99)

## 2022-05-22 LAB — CULTURE, BLOOD (ROUTINE X 2): Culture: NO GROWTH

## 2022-05-22 LAB — MAGNESIUM: Magnesium: 2.1 mg/dL (ref 1.7–2.4)

## 2022-05-22 SURGERY — ERCP, WITH INTERVENTION IF INDICATED
Anesthesia: General

## 2022-05-22 MED ORDER — LIDOCAINE 2% (20 MG/ML) 5 ML SYRINGE
INTRAMUSCULAR | Status: DC | PRN
  Administered 2022-05-22: 60 mg via INTRAVENOUS

## 2022-05-22 MED ORDER — DEXAMETHASONE SODIUM PHOSPHATE 10 MG/ML IJ SOLN
INTRAMUSCULAR | Status: DC | PRN
  Administered 2022-05-22: 10 mg via INTRAVENOUS

## 2022-05-22 MED ORDER — CIPROFLOXACIN IN D5W 400 MG/200ML IV SOLN
INTRAVENOUS | Status: DC | PRN
  Administered 2022-05-22: 400 mg via INTRAVENOUS

## 2022-05-22 MED ORDER — GLUCAGON HCL RDNA (DIAGNOSTIC) 1 MG IJ SOLR
INTRAMUSCULAR | Status: DC | PRN
  Administered 2022-05-22 (×3): .25 mg via INTRAVENOUS

## 2022-05-22 MED ORDER — SODIUM CHLORIDE 0.9 % IV SOLN: INTRAVENOUS | Status: DC

## 2022-05-22 MED ORDER — CIPROFLOXACIN HCL 500 MG PO TABS
500.0000 mg | ORAL_TABLET | Freq: Two times a day (BID) | ORAL | Status: DC
  Administered 2022-05-23 – 2022-05-24 (×3): 500 mg via ORAL
  Filled 2022-05-22 (×3): qty 1

## 2022-05-22 MED ORDER — CIPROFLOXACIN IN D5W 400 MG/200ML IV SOLN
INTRAVENOUS | Status: AC
  Filled 2022-05-22: qty 200

## 2022-05-22 MED ORDER — PROPOFOL 10 MG/ML IV BOLUS
INTRAVENOUS | Status: AC
  Filled 2022-05-22: qty 20

## 2022-05-22 MED ORDER — ONDANSETRON HCL 4 MG/2ML IJ SOLN
INTRAMUSCULAR | Status: DC | PRN
  Administered 2022-05-22: 4 mg via INTRAVENOUS

## 2022-05-22 MED ORDER — ROCURONIUM BROMIDE 100 MG/10ML IV SOLN
INTRAVENOUS | Status: DC | PRN
  Administered 2022-05-22: 20 mg via INTRAVENOUS
  Administered 2022-05-22: 50 mg via INTRAVENOUS

## 2022-05-22 MED ORDER — DICLOFENAC SUPPOSITORY 100 MG
RECTAL | Status: AC
  Filled 2022-05-22: qty 1

## 2022-05-22 MED ORDER — GLUCAGON HCL RDNA (DIAGNOSTIC) 1 MG IJ SOLR
INTRAMUSCULAR | Status: AC
  Filled 2022-05-22: qty 1

## 2022-05-22 MED ORDER — HYDRALAZINE HCL 20 MG/ML IJ SOLN: 10.0000 mg | Freq: Four times a day (QID) | INTRAMUSCULAR | Status: DC | PRN

## 2022-05-22 MED ORDER — DICLOFENAC SUPPOSITORY 100 MG
RECTAL | Status: DC | PRN
  Administered 2022-05-22: 100 mg via RECTAL

## 2022-05-22 MED ORDER — FENTANYL CITRATE (PF) 100 MCG/2ML IJ SOLN
INTRAMUSCULAR | Status: DC | PRN
  Administered 2022-05-22: 50 ug via INTRAVENOUS

## 2022-05-22 MED ORDER — SUGAMMADEX SODIUM 200 MG/2ML IV SOLN
INTRAVENOUS | Status: DC | PRN
  Administered 2022-05-22: 200 mg via INTRAVENOUS

## 2022-05-22 MED ORDER — FENTANYL CITRATE (PF) 100 MCG/2ML IJ SOLN
INTRAMUSCULAR | Status: AC
  Filled 2022-05-22: qty 2

## 2022-05-22 MED ORDER — LACTATED RINGERS IV SOLN: INTRAVENOUS | Status: DC

## 2022-05-22 MED ORDER — PROPOFOL 10 MG/ML IV BOLUS
INTRAVENOUS | Status: DC | PRN
  Administered 2022-05-22: 200 mg via INTRAVENOUS

## 2022-05-22 NOTE — Op Note (Signed)
[facility] Patient Name: Steve Romero Procedure Date: 05/22/2022 MRN: [id number] Attending MD: Justice Britain , MD, [id number] Date of Birth: 1970/08/03 CSN: [id number] Age: 51 Admit Type: Inpatient Procedure:                Upper EUS Indications:              Pancreatic cyst on MRCP, Acute recurrent                            pancreatitis Providers:                Justice Britain, MD, Jeanella Cara, RN,                            [facility] Technician, Technician Referring MD:             Inpatient Medical Service Medicines:                General Anesthesia, Cipro 973 mg IV Complications:            No immediate complications. Estimated Blood Loss:     Estimated blood loss was minimal. Procedure:                Pre-Anesthesia Assessment:                           - Prior to the procedure, a History and Physical                            was performed, and patient medications and                            allergies were reviewed. The patient's tolerance of                            previous anesthesia was also reviewed. The risks                            and benefits of the procedure and the sedation                            options and risks were discussed with the patient.                            All questions were answered, and informed consent                            was obtained. Prior Anticoagulants: The patient has                            taken Lovenox (enoxaparin), last dose was 1 day                            prior to procedure. ASA Grade Assessment: III - A  patient with severe systemic disease. After                            reviewing the risks and benefits, the patient was                            deemed in satisfactory condition to undergo the                            procedure.                           After obtaining informed consent, the endoscope was                             passed under direct vision. Throughout the                            procedure, the patient's blood pressure, pulse, and                            oxygen saturations were monitored continuously. The                            [id number]-H190 ([id number]) Olympus endoscope was introduced                            through the mouth, and advanced to the antrum of                            the stomach. The PCF-HQ190L ([id number]) Olympus                            colonoscope was introduced through the mouth, and                            advanced to the second part of duodenum. The                            [id number] ([id number]) Olympus linear ultrasound scope                            was introduced through the mouth, and advanced to                            the duodenum for ultrasound examination from the                            stomach and duodenum. The upper EUS was technically                            difficult and complex due to a J-shaped stomach  which made pyloric intubation difficult. Successful                            completion of the procedure was aided by performing                            the maneuvers documented (below) in this report.                            The patient tolerated the procedure. Scope In: Scope Out: Findings:      ENDOSCOPIC FINDING: :      No gross lesions were noted in the entire esophagus.      The Z-line was irregular and was found 41 cm from the incisors.      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      Extrinsic impression/compression on the stomach was found in the gastric       antrum. I could not get the adult endoscope to traverse into the       duodenum (I ran out of scope even with left-lateral       positioning/near-prone positioning). I then transitioned to the       pediatric colonoscope that allowed me to pass into the duodenum. A 0.035       long 450 cm Jagwire was placed into the  duodenum to help aid in EUS       scope passage for demarcation purposes.      No gross lesions were noted in the duodenal bulb, in the first portion       of the duodenum and in the second portion of the duodenum.      ENDOSONOGRAPHIC FINDING: :      An anechoic lesion suggestive of a cyst was identified in the genu of       the pancreas. It is not in obvious communication with the pancreatic       duct. The lesion measured 50 mm by 40 mm in maximal cross-sectional       diameter. There was a single compartment without septae. The outer wall       of the lesion was thick (~4 mm in size). There was no associated mass.       There was no internal debris within the fluid-filled cavity. I       considered a possible cystgastrstomy, but with the size of it currently,       I decided to just perform aspiration. Thus a diagnostic and therapeutic       needle aspiration for fluid was performed. Color Doppler imaging was       utilized prior to needle puncture to confirm a lack of significant       vascular structures within the needle path. One pass was made with the       22 gauge needle using a transgastric approach. A stylet was used. The       amount of fluid collected was 150 mL. The fluid was clear and white.       Sample(s) were sent for amylase concentration, lipase, cytology and CEA       and for fluid culture.      Pancreatic parenchymal abnormalities were noted in the entire pancreas.       These consisted of atrophy, lobularity and  hyperechoic strands.      The pancreatic duct had a dilated endosonographic appearance in the genu       of the pancreas and body of the pancreas. The pancreatic duct measured       up to 5 mm in diameter in the neck/body region.      There was dilation in the common bile duct and in the common hepatic       duct which measured up to 14 mm in the CHD region.      No malignant-appearing lymph nodes were visualized in the celiac region       (level 20),  peripancreatic region and porta hepatis region.      The celiac region was visualized. Impression:               EGD Impression:                           - No gross lesions in the entire esophagus. Z-line                            irregular, 41 cm from the incisors.                           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                           - Extrinsic impression/compression in the gastric                            antrum.                           - Needed pediatric colonoscope to traverse into the                            duodenum.                           - No gross lesions in the duodenal bulb, in the                            first portion of the duodenum and in the second                            portion of the duodenum.                           EUS Impression:                           - A cystic lesion was seen in the genu of the                            pancreas. Tissue has not been obtained. However,                            the endosonographic appearance is  consistent with a                            pancreatic pseudocyst. Decision made due to the                            small size to perform an aspiration rather than                            cystgastrostomy. Fine needle aspiration for fluid                            performed.                           - Pancreatic parenchymal abnormalities consisting                            of atrophy, lobularity and hyperechoic strands were                            noted in the entire pancreas.                           - The pancreatic duct had a dilated endosonographic                            appearance in the genu of the pancreas and body of                            the pancreas. The pancreatic duct measured up to 5                            mm in diameter in these regions.                           - There was dilation in the common bile duct and in                            the  common hepatic duct which measured up to 14 mm.                           - No malignant-appearing lymph nodes were                            visualized in the celiac region (level 20),                            peripancreatic region and porta hepatis region. Moderate Sedation:      Not Applicable - Patient had care per Anesthesia. Recommendation:           - Proceed to scheduled ERCP.                           -  Observe patient's clinical course.                           - Ciprofloxacin 500 mg twice daily for 3-days.                           - Based on ERCP findings, will decide on what                            follow up may be.                           - The findings and recommendations were discussed                            with the patient.                           - The findings and recommendations were discussed                            with the patient's family.                           - The findings and recommendations were discussed                            with the referring physician. Procedure Code(s):        --- Professional ---                           [phone], Esophagogastroduodenoscopy, flexible,                            transoral; with transendoscopic ultrasound-guided                            intramural or transmural fine needle                            aspiration/biopsy(s), (includes endoscopic                            ultrasound examination limited to the esophagus,                            stomach or duodenum, and adjacent structures)                           [id number], Esophagogastroduodenoscopy, flexible,                            transoral; with insertion of guide wire followed by                            passage of dilator(s) through esophagus over guide  wire Diagnosis Code(s):        --- Professional ---                           K22.89, Other specified disease of esophagus                           K31.89,  Other diseases of stomach and duodenum                           K86.2, Cyst of pancreas                           K86.9, Disease of pancreas, unspecified                           I89.9, Noninfective disorder of lymphatic vessels                            and lymph nodes, unspecified                           K85.90, Acute pancreatitis without necrosis or                            infection, unspecified                           R93.3, Abnormal findings on diagnostic imaging of                            other parts of digestive tract                           K83.8, Other specified diseases of biliary tract CPT copyright 2022 [employer]. All rights reserved. The codes documented in this report are preliminary and upon coder review may  be revised to meet current compliance requirements. Justice Britain, MD 05/22/2022 4:22:01 PM Number of Addenda: 0

## 2022-05-22 NOTE — Anesthesia Procedure Notes (Signed)
Procedure Name: Intubation Date/Time: 05/22/2022 1:59 PM  Performed by: Niel Hummer, CRNAPre-anesthesia Checklist: Patient identified, Emergency Drugs available, Suction available and Patient being monitored Patient Re-evaluated:Patient Re-evaluated prior to induction Oxygen Delivery Method: Circle system utilized Preoxygenation: Pre-oxygenation with 100% oxygen Induction Type: IV induction Ventilation: Mask ventilation without difficulty Laryngoscope Size: Mac and 4 Grade View: Grade I Tube type: Oral Tube size: 7.5 mm Number of attempts: 1 Airway Equipment and Method: Stylet Placement Confirmation: ETT inserted through vocal cords under direct vision, positive ETCO2 and breath sounds checked- equal and bilateral Secured at: 25 cm Tube secured with: Tape Dental Injury: Teeth and Oropharynx as per pre-operative assessment

## 2022-05-22 NOTE — Op Note (Signed)
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                            cholangiopancreatography, Pancreatic duct                            dilatation on magnetic resonance                            cholangiopancreatography, Elevated liver enzymes Providers:                Justice Britain, MD, Jeanella Cara, RN,                            [facility] Technician, Technician Referring MD:             Inpatient Medical Service Medicines:                General Anesthesia, Diclofenac 100 mg rectal,                            Glucagon 0.75 mg IV, Antibiotics administered                            during EUS Complications:            No immediate complications. Estimated Blood Loss:     Estimated blood loss was minimal. Procedure:                Pre-Anesthesia Assessment:                           - Prior to the procedure, a History and Physical                            was performed, and patient medications and                            allergies were reviewed. The patient's tolerance of                            previous anesthesia was also reviewed. The risks  and benefits of the procedure and the sedation                            options and risks were discussed with the patient.                            All questions were answered, and informed consent                            was obtained. Prior Anticoagulants: The patient has                             taken Lovenox (enoxaparin), last dose was 1 day                            prior to procedure. ASA Grade Assessment: III - A                            patient with severe systemic disease. After                            reviewing the risks and benefits, the patient was                            deemed in satisfactory condition to undergo the                            procedure.                           After obtaining informed consent, the scope was                            passed under direct vision. Throughout the                            procedure, the patient's blood pressure, pulse, and                            oxygen saturations were monitored continuously. The                            [id number]-Q190V ([id number] Olympus duodenoscope was                            introduced through the mouth, and used to inject                            contrast into and used to inject contrast into the                            bile duct and ventral pancreatic duct. The ERCP was  accomplished without difficulty. The patient                            tolerated the procedure. Scope In: Scope Out: Findings:      A scout film of the abdomen was obtained. Surgical clips, consistent       with a previous cholecystectomy, were seen in the area of the right       upper quadrant of the abdomen.      The esophagus was successfully intubated under direct vision without       detailed examination of the pharynx, larynx, and associated structures,       and upper GI tract. The upper GI tract was grossly normal. The major       papilla was edematous. A biliary sphincterotomy had been performed. The       sphincterotomy appeared open but was somewhat narrowed.      A short 0.035 inch Soft Jagwire was passed into the biliary tree. The       Hydratome sphincterotome was passed over the guidewire and the bile duct       was then deeply cannulated. Contrast was injected. I  personally       interpreted the bile duct images. Ductal flow of contrast was adequate.       Image quality was adequate. Contrast extended to the hepatic ducts.       Opacification of the entire biliary tree except for the gallbladder was       successful. The maximum diameter of the ducts was 13 mm. The lower third       of the main bile duct contained filling defect thought to be sludge.       There was an inflammatory narrowing distally making the distal duct       approximately 5-6 mm in size. Thus a sphincteroplasty and distal CBD       dilation was performed with an 02-11-09 mm balloon (to a maximum balloon       size of 10 mm) dilator which was successful. The biliary tree was swept       with a retrieval balloon. Sludge was swept from the duct. An occlusion       cholangiogram was performed that showed no further significant biliary       pathology.      I then transitioned with attempt at pancreatic ductal evaluation. I       could not get a long-position but rather just normal short positioning       for attempt at accessing the tree. After more than 30 minutes of       attempting and switching wires, and sphincterotomes, I was able to       obtain a superficial cannulation of and contrast injection into the       ventral pancreatic duct was accomplished with the Revolution Jagtome       sphincterotome. Opacification of the ventral pancreatic duct in the head       of the pancreas was incomplete. The maximum diameter of the ducts was 2       mm. Irregularity of the pancreatic duct was seen in the ventral       pancreatic duct in the head of the pancreas and I could not get the wire       to traverse more proximally into the pancreas without involuting the  wire. I decided I could not do anything further (similar issues at [place]       trying to access this region).      The duodenoscope was withdrawn from the patient and the procedure was       completed. Impression:                - The major papilla appeared edematous. Prior                            biliary sphincterotomy appeared open but was                            slightly narrowed.                           - The fluoroscopic examination was suspicious for                            sludge.                           - Distal narrowing noted in the duct. Balloon                            sphincteroplasty performed.                           - The biliary tree was swept and sludge was found.                           - An irregularity was found in the ventral                            pancreatic duct in the head of the pancreas and I                            could not traverse deeply into the proximal                            pancreatic duct - I am concerned as I had been                            previously of duct disruption (failed prior                            pancreatic evaluation at [facility] as well in the past). Moderate Sedation:      Not Applicable - Patient had care per Anesthesia. Recommendation:           - The patient will be observed post-procedure,                            until all discharge criteria are met.                           - Return patient to hospital  ward for ongoing care.                           - Advance diet as tolerated.                           - Observe patient's clinical course.                           - Watch for pancreatitis, bleeding, perforation,                            and cholangitis.                           - Will plan to monitor patient with hope to advance                            diet in the coming days.                           - Repeat CT-Pancreas Protocol Abdomen in 3-4 weeks.                           - If cyst is recurring, this will be signs of duct                            disruption. With prior failure at [facility] for                            pancreatic duct traversal and here as well, I                            think, that  we would need to consider a repeat                            Pseudocystgastrostomy and leaving double pigtails                            long-term vs Surgical                            cystgastrostomy/cystenterostomy as a more durable                            means of preventing recurrence of pancreatitis and                            cyst formation. The other surgical intervention                            would be wheter a Frey or [place] could be helpful.                            Will discuss this  further with Hepatobiliary                            surgery (I do not think this will be needed on an                            inpatient basis currently).                           - Watch for pancreatitis, bleeding, perforation,                            and cholangitis.                           - ADAT from [place] today.                           - The findings and recommendations were discussed                            with the patient.                           - The findings and recommendations were discussed                            with the patient's family.                           - The findings and recommendations were discussed                            with the referring physician. Procedure Code(s):        --- Professional ---                           [id number], Endoscopic retrograde                            cholangiopancreatography (ERCP); with removal of                            calculi/debris from biliary/pancreatic duct(s) Diagnosis Code(s):        --- Professional ---                           K83.8, Other specified diseases of biliary tract                           K85.90, Acute pancreatitis without necrosis or                            infection, unspecified                           K86.2, Cyst of pancreas  R74.8, Abnormal levels of other serum enzymes                           R93.2, Abnormal findings on diagnostic imaging of                             liver and biliary tract CPT copyright 2022 American Medical Association. All rights reserved. The codes documented in this report are preliminary and upon coder review may  be revised to meet current compliance requirements. Justice Britain, MD 05/22/2022 4:59:33 PM Number of Addenda: 0

## 2022-05-22 NOTE — Anesthesia Preprocedure Evaluation (Addendum)
Anesthesia Evaluation  Patient identified by MRN, date of birth, ID band Patient awake    Reviewed: Allergy & Precautions, NPO status , Patient's Chart, lab work & pertinent test results  Airway Mallampati: III  TM Distance: >3 FB Neck ROM: Full    Dental  (+) Dental Advisory Given, Edentulous Lower, Edentulous Upper   Pulmonary sleep apnea , COPD, Current Smoker and Patient abstained from smoking.   Pulmonary exam normal breath sounds clear to auscultation       Cardiovascular hypertension, Normal cardiovascular exam Rhythm:Regular Rate:Normal     Neuro/Psych  PSYCHIATRIC DISORDERS Anxiety Depression Bipolar Disorder Schizophrenia  negative neurological ROS     GI/Hepatic negative GI ROS,,,Abnormal MRICP, Choledocholithiasis, Dilated Bile duct, Abnormal LFTs, Recurrent Pancreatitis   Endo/Other  negative endocrine ROS    Renal/GU Renal disease     Musculoskeletal negative musculoskeletal ROS (+)    Abdominal   Peds  Hematology negative hematology ROS (+)   Anesthesia Other Findings Day of surgery medications reviewed with the patient.  Reproductive/Obstetrics                             Anesthesia Physical Anesthesia Plan  ASA: 3  Anesthesia Plan: General   Post-op Pain Management: Minimal or no pain anticipated   Induction: Intravenous  PONV Risk Score and Plan: 1 and Dexamethasone and Ondansetron  Airway Management Planned: Oral ETT  Additional Equipment:   Intra-op Plan:   Post-operative Plan: Extubation in OR  Informed Consent: I have reviewed the patients History and Physical, chart, labs and discussed the procedure including the risks, benefits and alternatives for the proposed anesthesia with the patient or authorized representative who has indicated his/her understanding and acceptance.     Dental advisory given  Plan Discussed with: CRNA  Anesthesia Plan  Comments:        Anesthesia Quick Evaluation

## 2022-05-22 NOTE — Anesthesia Postprocedure Evaluation (Signed)
Anesthesia Post Note  Patient: Steve Romero  Procedure(s) Performed: ENDOSCOPIC RETROGRADE CHOLANGIOPANCREATOGRAPHY (ERCP) REMOVAL OF STONES BILIARY DILATION UPPER ENDOSCOPIC ULTRASOUND (EUS) LINEAR FINE NEEDLE ASPIRATION     Patient location during evaluation: PACU Anesthesia Type: General Level of consciousness: awake and alert Pain management: pain level controlled Vital Signs Assessment: post-procedure vital signs reviewed and stable Respiratory status: spontaneous breathing, nonlabored ventilation, respiratory function stable and patient connected to nasal cannula oxygen Cardiovascular status: blood pressure returned to baseline and stable Postop Assessment: no apparent nausea or vomiting Anesthetic complications: no  No notable events documented.  Last Vitals:  Vitals:   05/22/22 1628 05/22/22 1638  BP: (!) 164/98 (!) 163/87  Pulse: 62 (!) 59  Resp: 13 16  Temp:    SpO2: 96% 97%    Last Pain:  Vitals:   05/22/22 1638  TempSrc:   PainSc: 0-No pain                 Coda Mathey

## 2022-05-22 NOTE — Interval H&P Note (Signed)
History and Physical Interval Note:  05/22/2022 12:26 PM  Steve Romero  has presented today for surgery, with the diagnosis of Abnormal MRICP, Choledocholithiasis, Dilated Bile duct, Abnormal LFTs, Recurrent Pancreatitis.  The various methods of treatment have been discussed with the patient and family. After consideration of risks, benefits and other options for treatment, the patient has consented to  Procedure(s): ENDOSCOPIC RETROGRADE CHOLANGIOPANCREATOGRAPHY (ERCP) (N/A) UPPER ENDOSCOPIC ULTRASOUND (EUS) LINEAR (N/A) as a surgical intervention.  The patient's history has been reviewed, patient examined, no change in status, stable for surgery.  I have reviewed the patient's chart and labs.  Questions were answered to the patient's satisfaction.     The risks of an EUS including intestinal perforation, bleeding, infection, aspiration, and medication effects were discussed as was the possibility it may not give a definitive diagnosis if a biopsy is performed.  When a biopsy of the pancreas is done as part of the EUS, there is an additional risk of pancreatitis at the rate of about 1-2%.  It was explained that procedure related pancreatitis is typically mild, although it can be severe and even life threatening, which is why we do not perform random pancreatic biopsies and only biopsy a lesion/area we feel is concerning enough to warrant the risk.  The risks of an ERCP were discussed at length, including but not limited to the risk of perforation, bleeding, abdominal pain, post-ERCP pancreatitis (while usually mild can be severe and even life threatening).   [employer]

## 2022-05-22 NOTE — Transfer of Care (Signed)
Immediate Anesthesia Transfer of Care Note  Patient: Steve Romero  Procedure(s) Performed: ENDOSCOPIC RETROGRADE CHOLANGIOPANCREATOGRAPHY (ERCP) REMOVAL OF STONES BILIARY DILATION UPPER ENDOSCOPIC ULTRASOUND (EUS) LINEAR FINE NEEDLE ASPIRATION  Patient Location: PACU  Anesthesia Type:General  Level of Consciousness: drowsy and patient cooperative  Airway & Oxygen Therapy: Patient Spontanous Breathing  Post-op Assessment: Report given to RN and Post -op Vital signs reviewed and stable  Post vital signs: Reviewed and stable  Last Vitals:  Vitals Value Taken Time  BP 174/93 05/22/22 1608  Temp    Pulse 60 05/22/22 1609  Resp 12 05/22/22 1609  SpO2 97 % 05/22/22 1609  Vitals shown include unvalidated device data.  Last Pain:  Vitals:   05/22/22 1303  TempSrc: Temporal  PainSc: 5       Patients Stated Pain Goal: 3 (99/69/24 9324)  Complications: No notable events documented.

## 2022-05-22 NOTE — Progress Notes (Signed)
PROGRESS NOTE   Steve Romero  SMO:[id number] DOB: 1970-10-03 DOA: 05/17/2022 PCP: Steve Norlander, DO   Date of Service: the patient was seen and examined on 05/22/2022  Brief Narrative:  51 y.o. 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    Patient now presents with several days of nausea vomiting so Steve Romero long emergency department on 11/12.  Upon evaluation patient was found to have acute pancreatitis with pseudocyst formation.  Presentation complicated by acute kidney injury as well as new diagnosis of diabetes.  Possible prescription was then called to assess the patient for admission to the hospital.    Patient was initially managed with a regimen of intravenous empiric cefepime and Flagylwhich has since been discontinued.  Acute pancreatitis further evaluated by MRCP showing recurrent CBD stone.  Gastroenterology was consulted.  With plans to undergo repeat ERCP.  Acute kidney injury successfully managed with intravenous volume resuscitation.  Patient's substantial hyperglycemia was further evaluated revealing hemoglobin A1c of 6.3% and spontaneously downtrending hyperglycemia suggesting the patient was simply suffering from hyperglycemia secondary to acute pancreatitis in the setting of prediabetes and not true diabetes mellitus.  Patient eventually underwent ERCP with Dr. Rush Romero on 11/17 revealing an edematous major papilla, a slightly narrowed prior biliary sphincterotomy with fluoroscopic examination suspicious for sludge with distal narrowing noted in the duct.  A balloon sphincteroplasty was performed with sludge in the biliary tree being swept out successfully.  There was an additional irregularity found in the ventral pancreatic duct in the head of the pancreas but evaluation of this was  unsuccessful.  Gastroenterology recommended advancing diet as tolerated, observing for post ERCP complications and a repeat CT pancreas protocol in 3 to 4 weeks.    Assessment and Plan: * Acute pancreatitis with sludge in the biliary tree Patient is status post ERCP with successful sweeping of sludge in the biliary tree today as well as balloon sphincteroplasty Back on the medical unit, advancing diet as tolerated per gastroenterology recommendations Monitoring for post ERCP complications. Continue to follow gastroenterology recommendations for further therapeutic options.  Patient may require advanced operative intervention to prevent further exacerbation/recurrence Clear liquid diet for now with n.p.o. 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  Creatinine of 3.2 on admission with peak of 3.95.  Improved with IV fluids   Exocrine pancreatic insufficiency Continue Creon  Essential hypertension Blood pressure elevated status post procedure likely due to pain. Continue hydralazine PRN  Hyperglycemia initial blood sugars well over 300 on admission Hyperglycemia has spontaneously improved without basal insulin therapy  Hemoglobin A1C of 6.3% more suggestive of prediabetes rather than true diabetes mellitus Patient likely suffered from hyperglycemia in the setting of acute pancreatitis and underlying prediabetes Continue SSI      Subjective:  Prior to procedure patient was still complaining of abdominal pain, epigastric in location, moderate to severe in intensity, sharp in quality, worse with as needed opiate-based analgesics   Physical  Exam:  Vitals:   05/22/22 1608 05/22/22 1618 05/22/22 1628 05/22/22 1638  BP: (!) 174/93 (!) 165/93 (!) 164/98 (!) 163/87  Pulse: 62 60 62 (!)  59  Resp: '12 13 13 16  '$ Temp: (!) 97.4 F (36.3 C)     TempSrc: Temporal     SpO2: 96% 97% 96% 97%  Weight:      Height:         Constitutional: Awake alert and oriented x3, no associated distress.   Skin: no rashes, no lesions, good skin turgor noted. Eyes: Pupils are equally reactive to light.  No evidence of scleral icterus or conjunctival pallor.  ENMT: Moist mucous membranes noted.  Posterior pharynx clear of any exudate or lesions.   Respiratory: clear to auscultation bilaterally, no wheezing, no crackles. Normal respiratory effort. No accessory muscle use.  Cardiovascular: Regular rate and rhythm, no murmurs / rubs / gallops. No extremity edema. 2+ pedal pulses. No carotid bruits.  Abdomen: Abdomen is generally tender, worst in the epigastric region.  Abdomen is soft.  No evidence of intra-abdominal masses.  Positive bowel sounds noted in all quadrants.   Musculoskeletal: No joint deformity upper and lower extremities. Good ROM, no contractures. Normal muscle tone.    Data Reviewed:  I have personally reviewed and interpreted labs, imaging.  Significant findings are   CBC: Recent Labs  Lab 05/17/22 0835 05/17/22 1801 05/18/22 0433 05/19/22 0527 05/20/22 0529 05/21/22 0559 05/22/22 0555  WBC 18.8*  --  22.8* 15.3* 9.5 7.6 8.2  NEUTROABS 15.6*  --   --  11.5* 6.8 4.8 5.2  HGB 21.6*   < > 18.4* 16.8 14.5 14.7 14.7  HCT 58.3*   < > 51.9 48.1 42.1 42.6 43.6  MCV 85.4  --  88.3 89.6 89.8 91.0 91.4  PLT 386  --  327 288 222 233 256   < > = values in this interval not displayed.    Basic Metabolic Panel: Recent Labs  Lab 05/18/22 0433 05/19/22 0527 05/20/22 0529 05/21/22 0559 05/22/22 0555  NA 140 133* 137 138 138  K 3.5 3.7 3.3* 4.4 4.1  CL 97* 91* 99 102 103  CO2 28 32 31 33* 28  GLUCOSE 126* 139* 131* 137* 127*   BUN 54* 34* '19 13 13  '$ CREATININE 2.47* 1.21 0.91 0.91 0.75  CALCIUM 8.5* 8.5* 8.3* 8.8* 9.0  MG  --   --   --   --  2.1    GFR: Estimated Creatinine Clearance: 125.9 mL/min (by C-G formula based on SCr of 0.75 mg/dL). Liver Function Tests: Recent Labs  Lab 05/18/22 0433 05/18/22 0657 05/19/22 0527 05/20/22 0529 05/21/22 0559 05/22/22 0555  AST 16  --  '22 17 16 19  '$ ALT 28  --  '29 25 25 25  '$ ALKPHOS 172*  --  158* 128* 128* 126  BILITOT 1.1 1.0 1.6* 1.3* 1.0 0.7  PROT 8.1  --  7.1 6.0* 6.0* 6.3*  ALBUMIN 4.1  --  3.9 3.3* 3.4* 3.4*       Code Status:  Full code.  Code status decision has been confirmed with: patient Family Communication: dererred    Severity of Illness:  The appropriate patient status for this patient is INPATIENT. Inpatient status is judged to be reasonable and necessary in order to provide the required intensity of service to ensure the patient's safety. The patient's presenting symptoms, physical exam findings, and initial radiographic and laboratory data in the context of their chronic comorbidities is felt to place them at high risk for further clinical deterioration. Furthermore, it is not anticipated that the patient will be medically stable for discharge from the hospital within  2 midnights of admission.   * I certify that at the point of admission it is my clinical judgment that the patient will require inpatient hospital care spanning beyond 2 midnights from the point of admission due to high intensity of service, high risk for further deterioration and high frequency of surveillance required.*  Time spent:  50 minutes  Author:  Vernelle Emerald MD  05/22/2022 7:14 PM

## 2022-05-23 DIAGNOSIS — R1013 Epigastric pain: Secondary | ICD-10-CM

## 2022-05-23 DIAGNOSIS — Z8719 Personal history of other diseases of the digestive system: Secondary | ICD-10-CM | POA: Diagnosis not present

## 2022-05-23 DIAGNOSIS — N179 Acute kidney failure, unspecified: Secondary | ICD-10-CM | POA: Diagnosis not present

## 2022-05-23 DIAGNOSIS — K863 Pseudocyst of pancreas: Secondary | ICD-10-CM | POA: Diagnosis not present

## 2022-05-23 DIAGNOSIS — K8681 Exocrine pancreatic insufficiency: Secondary | ICD-10-CM | POA: Diagnosis not present

## 2022-05-23 DIAGNOSIS — K8500 Idiopathic acute pancreatitis without necrosis or infection: Secondary | ICD-10-CM | POA: Diagnosis not present

## 2022-05-23 LAB — COMPREHENSIVE METABOLIC PANEL
ALT: 34 U/L (ref 0–44)
AST: 23 U/L (ref 15–41)
Albumin: 3.5 g/dL (ref 3.5–5.0)
Alkaline Phosphatase: 141 U/L — ABNORMAL HIGH (ref 38–126)
Anion gap: 8 (ref 5–15)
BUN: 23 mg/dL — ABNORMAL HIGH (ref 6–20)
CO2: 26 mmol/L (ref 22–32)
Calcium: 8.8 mg/dL — ABNORMAL LOW (ref 8.9–10.3)
Chloride: 102 mmol/L (ref 98–111)
Creatinine, Ser: 0.93 mg/dL (ref 0.61–1.24)
GFR, Estimated: >60 mL/min (ref >60)
Glucose, Bld: 160 mg/dL — ABNORMAL HIGH (ref 70–99)
Potassium: 4.4 mmol/L (ref 3.5–5.1)
Sodium: 136 mmol/L (ref 135–145)
Total Bilirubin: 0.9 mg/dL (ref 0.3–1.2)
Total Protein: 6.7 g/dL (ref 6.5–8.1)

## 2022-05-23 LAB — CBC WITH DIFFERENTIAL/PLATELET
Abs Immature Granulocytes: 0.07 10*3/uL (ref 0.00–0.07)
Basophils Absolute: 0 10*3/uL (ref 0.0–0.1)
Basophils Relative: 0 %
Eosinophils Absolute: 0 10*3/uL (ref 0.0–0.5)
Eosinophils Relative: 0 %
HCT: 41.1 % (ref 39.0–52.0)
Hemoglobin: 14.3 g/dL (ref 13.0–17.0)
Immature Granulocytes: 1 %
Lymphocytes Relative: 13 %
Lymphs Abs: 1.4 10*3/uL (ref 0.7–4.0)
MCH: 31.2 pg (ref 26.0–34.0)
MCHC: 34.8 g/dL (ref 30.0–36.0)
MCV: 89.5 fL (ref 80.0–100.0)
Monocytes Absolute: 0.6 10*3/uL (ref 0.1–1.0)
Monocytes Relative: 6 %
Neutro Abs: 8.8 10*3/uL — ABNORMAL HIGH (ref 1.7–7.7)
Neutrophils Relative %: 80 %
Platelets: 276 10*3/uL (ref 150–400)
RBC: 4.59 MIL/uL (ref 4.22–5.81)
RDW: 12.1 % (ref 11.5–15.5)
WBC: 11 10*3/uL — ABNORMAL HIGH (ref 4.0–10.5)
nRBC: 0 % (ref 0.0–0.2)

## 2022-05-23 LAB — GLUCOSE, CAPILLARY
Glucose-Capillary: 134 mg/dL — ABNORMAL HIGH (ref 70–99)
Glucose-Capillary: 107 mg/dL — ABNORMAL HIGH (ref 70–99)
Glucose-Capillary: 206 mg/dL — ABNORMAL HIGH (ref 70–99)
Glucose-Capillary: 111 mg/dL — ABNORMAL HIGH (ref 70–99)

## 2022-05-23 LAB — MAGNESIUM: Magnesium: 2.1 mg/dL (ref 1.7–2.4)

## 2022-05-23 LAB — C-REACTIVE PROTEIN: CRP: 2.5 mg/dL — ABNORMAL HIGH (ref <1.0)

## 2022-05-23 LAB — LIPASE, BLOOD: Lipase: 305 U/L — ABNORMAL HIGH (ref 11–51)

## 2022-05-23 LAB — CULTURE, BLOOD (ROUTINE X 2)
Culture: NO GROWTH
Special Requests: ADEQUATE

## 2022-05-23 LAB — SEDIMENTATION RATE: Sed Rate: 27 mm/hr — ABNORMAL HIGH (ref 0–16)

## 2022-05-23 MED ORDER — DICYCLOMINE HCL 10 MG PO CAPS
10.0000 mg | ORAL_CAPSULE | Freq: Once | ORAL | Status: AC
  Administered 2022-05-23: 10 mg via ORAL
  Filled 2022-05-23: qty 1

## 2022-05-23 MED ORDER — HYDROMORPHONE HCL 1 MG/ML IJ SOLN
0.5000 mg | INTRAMUSCULAR | Status: AC
  Administered 2022-05-23: 0.5 mg via INTRAVENOUS
  Filled 2022-05-23: qty 0.5

## 2022-05-23 MED ORDER — PANTOPRAZOLE SODIUM 40 MG PO TBEC
40.0000 mg | DELAYED_RELEASE_TABLET | Freq: Every day | ORAL | Status: DC
  Administered 2022-05-24: 40 mg via ORAL
  Filled 2022-05-23: qty 1

## 2022-05-23 NOTE — Progress Notes (Signed)
Progress Note   Subjective  Patient feels better than yesterday, post procedure. Baseline pain around 3/10, which is where is normal pain level is at home when not flaring. Tolerated a liquid diet so far, wants to advance diet today.    Objective   Vital signs in last 24 hours: Temp:  [97.4 F (36.3 C)-97.9 F (36.6 C)] 97.7 F (36.5 C) (11/18 0507) Pulse Rate:  [58-72] 72 (11/18 0507) Resp:  [12-16] 14 (11/18 0507) BP: (121-174)/(73-98) 141/88 (11/18 0507) SpO2:  [95 %-99 %] 99 % (11/18 0507) Weight:  [94.3 kg] 94.3 kg (11/17 1303) Last BM Date : 05/22/22 General:    white male in NAD Abdomen:  Soft, nondistended.  Neurologic:  Alert and oriented,  grossly normal neurologically. Psych:  Cooperative. Normal mood and affect.  Intake/Output from previous day: 11/17 0701 - 11/18 0700 In: 1563.2 [I.V.:1363.2; IV Piggyback:200] Out: -  Intake/Output this shift: Total I/O In: 960 [P.O.:960] Out: -   Lab Results: Recent Labs    05/21/22 0559 05/22/22 0555 05/23/22 0617  WBC 7.6 8.2 11.0*  HGB 14.7 14.7 14.3  HCT 42.6 43.6 41.1  PLT 233 256 276   BMET Recent Labs    05/21/22 0559 05/22/22 0555 05/23/22 0617  NA 138 138 136  K 4.4 4.1 4.4  CL 102 103 102  CO2 33* 28 26  GLUCOSE 137* 127* 160*  BUN 13 13 23*  CREATININE 0.91 0.75 0.93  CALCIUM 8.8* 9.0 8.8*   LFT Recent Labs    05/23/22 0617  PROT 6.7  ALBUMIN 3.5  AST 23  ALT 34  ALKPHOS 141*  BILITOT 0.9   PT/INR No results for input(s): "LABPROT", "INR" in the last 72 hours.  Studies/Results: DG ERCP  Result Date: 05/22/2022 CLINICAL DATA:  Acute pancreatitis. EXAM: ERCP COMPARISON:  ERCP, 07/24/2021. CT AP, 05/17/2022. MR CP, 05/19/2022. FLUOROSCOPY: Exposure Index (as provided by the fluoroscopic device): 230 mGy Kerma FINDINGS: Multiple, limited oblique planar images of the RIGHT upper quadrant obtained C-arm. Images demonstrating flexible endoscopy, biliary duct cannulation, retrograde  cholangiogram and balloon sweep. Mild extrahepatic biliary ductal dilation with distal common bile duct filling defect is suspected. Cholecystectomy clips. IMPRESSION: Fluoroscopic imaging for ERCP. Mild extrahepatic biliary ductal dilation, with distal common bile duct filling defects. For complete description of intra procedural findings, please see performing service dictation. Electronically Signed   By: Michaelle Birks M.D.   On: 05/22/2022 16:33       Assessment / Plan:    51 year old male with a history of severe necrotizing pancreatitis, history of multiple necrosectomy's, who has had recurrent pancreatitis and developed pancreatic cyst causing symptoms of worsening pain.  He is status post EUS with cyst drainage yesterday along with sphincterotomy, CBD balloon dilation, removal of sludge from CBD.  During the ERCP, there was also concern for pancreatic duct disruption. Following intervention yesterday he is feeling better today and back to baseline.  Tolerating full liquid diet and is hungry and wants to advance his diet further.  No clinical evidence of post ERCP pancreatitis.  I think okay to advance to soft diet today.  If he does well with diet today can advance to regular/low-fat tomorrow and perhaps home tomorrow afternoon if he does well.  Plan is to continue Cipro 500 mg twice daily for 3 days.  Dr. Rush Landmark would like to repeat the CT scan as an outpatient in 3 to 4 weeks.  If the cyst does recur over time,  options would be placement of pigtail drain into the cyst endoscopically or consider surgical evaluation.  Difficult situation, hopefully he can heal with more time.  I discussed the plan with the patient and Dr. Cyd Silence, they agree.  PLAN: - advance to soft low fat diet today - continue creon - continue pain regimen - continue cipro '500mg'$  BID for 3 days - possibly discharge home tomorrow if tolerating low fat diet tomorrow - as outpatient, will need close follow up and CT  scan in 3-4 weeks  Call with questions. We will reassess him tomorrow morning  Jolly Mango, MD [facility]

## 2022-05-23 NOTE — Progress Notes (Signed)
PROGRESS NOTE   Steve Romero  XTK:[id number] DOB: 07-May-1971 DOA: 05/17/2022 PCP: [name], DO   Date of Service: the patient was seen and examined on 05/23/2022  Brief Narrative:  51 y.o. 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    Patient now presents with several days of nausea vomiting so [name] department on 11/12.  Upon evaluation patient was found to have acute pancreatitis with pseudocyst formation.  Presentation complicated by acute kidney injury as well as new diagnosis of diabetes.  Possible prescription was then called to assess the patient for admission to the hospital.    Patient was initially managed with a regimen of intravenous empiric cefepime and Flagylwhich has since been discontinued.  Acute pancreatitis further evaluated by MRCP showing recurrent CBD stone.  Gastroenterology was consulted.  With plans to undergo repeat ERCP.  Acute kidney injury successfully managed with intravenous volume resuscitation.  Patient's substantial hyperglycemia was further evaluated revealing hemoglobin A1c of 6.3% and spontaneously downtrending hyperglycemia suggesting the patient was simply suffering from hyperglycemia secondary to acute pancreatitis in the setting of prediabetes and not true diabetes mellitus.  Patient eventually underwent ERCP with Dr. Rush Landmark on 11/17 revealing an edematous major papilla, a slightly narrowed prior biliary sphincterotomy with fluoroscopic examination suspicious for sludge with distal narrowing noted in the duct.  A balloon sphincteroplasty was performed with sludge in the biliary tree being swept out successfully.  There was an additional irregularity found in the ventral pancreatic duct in the head of the pancreas but evaluation of this was  unsuccessful.  Gastroenterology recommended advancing diet as tolerated, observing for post ERCP complications and a repeat CT pancreas protocol in 3 to 4 weeks.    Assessment and Plan: * Acute pancreatitis with sludge in the biliary tree Patient is status post ERCP with successful sweeping of sludge in the biliary tree on 11/17 as well as balloon sphincteroplasty advancing diet as tolerated per gastroenterology recommendations.  Will advance to soft diet today. Monitoring for post ERCP complications such as post ERCP pancreatitis. I personally discussed the case with Dr. Havery Moros with gastroenterology today.  Patient may require advanced operative intervention to prevent further exacerbation/recurrence however this will be pursued as an outpatient. 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  Creatinine of 3.2 on admission with peak of 3.95.  Improved with IV fluids   Exocrine pancreatic insufficiency Continue Creon  Essential hypertension Blood pressure elevated status post procedure likely due to pain. Continue hydralazine PRN  Hyperglycemia in the setting of Prediabetes initial blood sugars well over 300 on admission Hyperglycemia has spontaneously improved without basal insulin therapy  Hemoglobin A1C of 6.3% more suggestive of prediabetes rather than actual diabetes mellitus Patient likely suffered from hyperglycemia in the setting of acute pancreatitis and underlying prediabetes Continue SSI Based on glycemic trends during this hospitalization patient may need to go home with oral hypoglycemic therapy.    Subjective:  Patient states that his abdominal  pain is improved since yesterday.  Patient states that his abdominal pain is 3 out of 10 in intensity, epigastric in location and nonradiating.  Patient states that this abdominal pain is similar to his baseline chronic abdominal pain.  Patient states that he is hungry and wishes to advance his diet.    Physical Exam:  Vitals:   05/22/22 1638 05/22/22 2036 05/23/22 0507 05/23/22 1339  BP: (!) 163/87 121/88 (!) 141/88 133/87  Pulse: (!) 59 67 72 65  Resp: '16 14 14 18  '$ Temp:  (!) 97.5 F (36.4 C) 97.7 F (36.5 C) 97.8 F (36.6 C)  TempSrc:  Oral Oral Oral  SpO2: 97% 97% 99% 96%  Weight:      Height:         Constitutional: Awake alert and oriented x3, no associated distress.   Skin: no rashes, no lesions, good skin turgor noted. Eyes: Pupils are equally reactive to light.  No evidence of scleral icterus or conjunctival pallor.  ENMT: Moist mucous membranes noted.  Posterior pharynx clear of any exudate or lesions.   Respiratory: clear to auscultation bilaterally, no wheezing, no crackles. Normal respiratory effort. No accessory muscle use.  Cardiovascular: Regular rate and rhythm, no murmurs / rubs / gallops. No extremity edema. 2+ pedal pulses. No carotid bruits.  Abdomen: Mild tender in the epigastric area.  Abdomen is soft.  No evidence of intra-abdominal masses.  Positive bowel sounds noted in all quadrants.   Musculoskeletal: No joint deformity upper and lower extremities. Good ROM, no contractures. Normal muscle tone.    Data Reviewed:  I have personally reviewed and interpreted labs, imaging.  Significant findings are   CBC: Recent Labs  Lab 05/19/22 0527 05/20/22 0529 05/21/22 0559 05/22/22 0555 05/23/22 0617  WBC 15.3* 9.5 7.6 8.2 11.0*  NEUTROABS 11.5* 6.8 4.8 5.2 8.8*  HGB 16.8 14.5 14.7 14.7 14.3  HCT 48.1 42.1 42.6 43.6 41.1  MCV 89.6 89.8 91.0 91.4 89.5  PLT 288 222 233 256 222    Basic Metabolic Panel: Recent Labs  Lab 05/19/22 0527 05/20/22 0529  05/21/22 0559 05/22/22 0555 05/23/22 0617  NA 133* 137 138 138 136  K 3.7 3.3* 4.4 4.1 4.4  CL 91* 99 102 103 102  CO2 32 31 33* 28 26  GLUCOSE 139* 131* 137* 127* 160*  BUN 34* '19 13 13 '$ 23*  CREATININE 1.21 0.91 0.91 0.75 0.93  CALCIUM 8.5* 8.3* 8.8* 9.0 8.8*  MG  --   --   --  2.1 2.1    GFR: Estimated Creatinine Clearance: 108.3 mL/min (by C-G formula based on SCr of 0.93 mg/dL). Liver Function Tests: Recent Labs  Lab 05/19/22 0527 05/20/22 0529 05/21/22 0559 05/22/22 0555 05/23/22 0617  AST '22 17 16 19 23  '$ ALT '29 25 25 25 '$ 34  ALKPHOS 158* 128* 128* 126 141*  BILITOT 1.6* 1.3* 1.0 0.7 0.9  PROT 7.1 6.0* 6.0* 6.3* 6.7  ALBUMIN 3.9 3.3* 3.4* 3.4* 3.5       Code Status:  Full code.  Code status decision has been confirmed with: patient    Severity of Illness:  The appropriate patient status for this patient is INPATIENT. Inpatient status is judged to be reasonable and necessary in order to provide the required intensity of service to ensure the patient's safety. The patient's presenting symptoms, physical exam findings, and initial radiographic and laboratory data in the context of their chronic comorbidities is felt to place them at high risk for further clinical deterioration. Furthermore, it is not anticipated that the patient will be medically stable for discharge from the hospital within 2 midnights of admission.   *  I certify that at the point of admission it is my clinical judgment that the patient will require inpatient hospital care spanning beyond 2 midnights from the point of admission due to high intensity of service, high risk for further deterioration and high frequency of surveillance required.*  Time spent:  35 minutes  Author:  Vernelle Emerald MD  05/23/2022 7:55 PM

## 2022-05-23 NOTE — Progress Notes (Signed)
PHARMACIST - PHYSICIAN COMMUNICATION  DR:   Cyd Silence  CONCERNING: IV to Oral Route Change Policy  RECOMMENDATION: This patient is receiving pantoprazole by the intravenous route.  Based on criteria approved by the Pharmacy and Therapeutics Committee, the intravenous medication(s) is/are being converted to the equivalent oral dose form(s).   DESCRIPTION: These criteria include: The patient is eating (either orally or via tube) and/or has been taking other orally administered medications for a least 24 hours The patient has no evidence of active gastrointestinal bleeding or impaired GI absorption (gastrectomy, short bowel, patient on TNA or NPO).  If you have questions about this conversion, please contact the Pharmacy Department  '[]'$   [phone] )  [facility] '[]'$   [phone] )  [facility] '[]'$   [phone] )  [facility] '[]'$   [phone] )  [facility] '[x]'$   [phone] )  [place], [facility] 05/23/2022 12:39 PM

## 2022-05-24 ENCOUNTER — Encounter (HOSPITAL_COMMUNITY): Payer: Self-pay | Admitting: Gastroenterology

## 2022-05-24 DIAGNOSIS — R7303 Prediabetes: Secondary | ICD-10-CM

## 2022-05-24 DIAGNOSIS — K863 Pseudocyst of pancreas: Secondary | ICD-10-CM | POA: Diagnosis not present

## 2022-05-24 DIAGNOSIS — Z8719 Personal history of other diseases of the digestive system: Secondary | ICD-10-CM | POA: Diagnosis not present

## 2022-05-24 DIAGNOSIS — K8681 Exocrine pancreatic insufficiency: Secondary | ICD-10-CM | POA: Diagnosis not present

## 2022-05-24 DIAGNOSIS — K861 Other chronic pancreatitis: Secondary | ICD-10-CM

## 2022-05-24 DIAGNOSIS — N179 Acute kidney failure, unspecified: Secondary | ICD-10-CM | POA: Diagnosis not present

## 2022-05-24 DIAGNOSIS — K859 Acute pancreatitis without necrosis or infection, unspecified: Secondary | ICD-10-CM | POA: Diagnosis not present

## 2022-05-24 LAB — COMPREHENSIVE METABOLIC PANEL
ALT: 37 U/L (ref 0–44)
AST: 22 U/L (ref 15–41)
Albumin: 3.6 g/dL (ref 3.5–5.0)
Alkaline Phosphatase: 130 U/L — ABNORMAL HIGH (ref 38–126)
Anion gap: 7 (ref 5–15)
BUN: 21 mg/dL — ABNORMAL HIGH (ref 6–20)
CO2: 28 mmol/L (ref 22–32)
Calcium: 9.1 mg/dL (ref 8.9–10.3)
Chloride: 103 mmol/L (ref 98–111)
Creatinine, Ser: 0.91 mg/dL (ref 0.61–1.24)
GFR, Estimated: >60 mL/min (ref >60)
Glucose, Bld: 132 mg/dL — ABNORMAL HIGH (ref 70–99)
Potassium: 4 mmol/L (ref 3.5–5.1)
Sodium: 138 mmol/L (ref 135–145)
Total Bilirubin: 0.4 mg/dL (ref 0.3–1.2)
Total Protein: 6.7 g/dL (ref 6.5–8.1)

## 2022-05-24 LAB — CBC WITH DIFFERENTIAL/PLATELET
Abs Immature Granulocytes: 0.06 10*3/uL (ref 0.00–0.07)
Basophils Absolute: 0.1 10*3/uL (ref 0.0–0.1)
Basophils Relative: 1 %
Eosinophils Absolute: 0.3 10*3/uL (ref 0.0–0.5)
Eosinophils Relative: 2 %
HCT: 43.1 % (ref 39.0–52.0)
Hemoglobin: 14.3 g/dL (ref 13.0–17.0)
Immature Granulocytes: 1 %
Lymphocytes Relative: 21 %
Lymphs Abs: 2.5 10*3/uL (ref 0.7–4.0)
MCH: 30.8 pg (ref 26.0–34.0)
MCHC: 33.2 g/dL (ref 30.0–36.0)
MCV: 92.7 fL (ref 80.0–100.0)
Monocytes Absolute: 0.9 10*3/uL (ref 0.1–1.0)
Monocytes Relative: 7 %
Neutro Abs: 8.6 10*3/uL — ABNORMAL HIGH (ref 1.7–7.7)
Neutrophils Relative %: 68 %
Platelets: 297 10*3/uL (ref 150–400)
RBC: 4.65 MIL/uL (ref 4.22–5.81)
RDW: 12.3 % (ref 11.5–15.5)
WBC: 12.4 10*3/uL — ABNORMAL HIGH (ref 4.0–10.5)
nRBC: 0 % (ref 0.0–0.2)

## 2022-05-24 LAB — LIPASE, BLOOD: Lipase: 469 U/L — ABNORMAL HIGH (ref 11–51)

## 2022-05-24 LAB — MAGNESIUM: Magnesium: 2.3 mg/dL (ref 1.7–2.4)

## 2022-05-24 LAB — GLUCOSE, CAPILLARY
Glucose-Capillary: 117 mg/dL — ABNORMAL HIGH (ref 70–99)
Glucose-Capillary: 154 mg/dL — ABNORMAL HIGH (ref 70–99)

## 2022-05-24 MED ORDER — OXYCODONE HCL 5 MG PO TABS: 5.0000 mg | ORAL_TABLET | Freq: Four times a day (QID) | ORAL | 0 refills | Status: AC | PRN

## 2022-05-24 MED ORDER — PANTOPRAZOLE SODIUM 40 MG PO TBEC: 40.0000 mg | DELAYED_RELEASE_TABLET | Freq: Every day | ORAL | 1 refills | Status: DC

## 2022-05-24 MED ORDER — CIPROFLOXACIN HCL 500 MG PO TABS: 500.0000 mg | ORAL_TABLET | Freq: Two times a day (BID) | ORAL | 0 refills | Status: AC

## 2022-05-24 MED ORDER — PANCRELIPASE (LIP-PROT-AMYL) 36000-114000 UNITS PO CPEP: 72000.0000 [IU] | ORAL_CAPSULE | Freq: Three times a day (TID) | ORAL | 1 refills | Status: DC

## 2022-05-24 NOTE — Discharge Summary (Addendum)
Physician Discharge Summary   Patient: Steve Romero MRN: [id number] DOB: 11/13/1970  Admit date:     05/17/2022  Discharge date: 05/24/22  Discharge Physician: Vernelle Emerald   PCP: [name], DO   Recommendations at discharge:   Patient advised to follow-up closely as an outpatient with his primary care provider Patient advised to follow-up with [facility] gastroenterology after discharge and will be contacted with an appointment Patient is to undergo repeat CT imaging of the abdomen in 3 to 4 weeks which will be arranged by his outpatient gastroenterologist And has been prescribed 2 additional days of oral ciprofloxacin which she should complete in the outpatient setting. Patient is to maintain a low-sodium diet and is to avoid alcohol, greasy food, spicy food Patient has been found to have prediabetes during this hospitalization.  While therapy is not warranted, patient has been advised to pick up a glucometer strips and lancets from the local [employer] and to check his fasting blood sugars daily and maintain a blood sugar diary which can later be reviewed with his primary care provider on follow-up.   Discharge Diagnoses: Principal Problem:   Acute on chronic pancreatitis ([place]) Active Problems:   Pseudocyst, pancreas   AKI (acute kidney injury) ([place])   Exocrine pancreatic insufficiency   Essential hypertension   Prediabetes  Resolved Problems:   * No resolved hospital problems. *   Hospital Course: 51 y.o. 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    Patient now presents with several days of nausea vomiting so [name] emergency department on 11/12.  Upon evaluation patient was found to have acute pancreatitis with pseudocyst formation.  Presentation complicated by acute kidney  injury as well as substantial hyperglycemia.  Possible prescription was then called to assess the patient for admission to the hospital.    Patient was initially managed with a regimen of intravenous empiric cefepime and Flagylwhich has since been discontinued.  Acute pancreatitis further evaluated by MRCP showing recurrent CBD stone.  Gastroenterology was consulted.  With plans to undergo repeat ERCP.  Acute kidney injury successfully managed with intravenous volume resuscitation.  Patient's substantial hyperglycemia was further evaluated revealing hemoglobin A1c of 6.3% and spontaneously downtrending hyperglycemia suggesting the patient was simply suffering from hyperglycemia secondary to acute pancreatitis in the setting of prediabetes and diabetes mellitus.  Patient eventually underwent ERCP with Dr. Rush Landmark on 11/17 revealing an edematous major papilla, a slightly narrowed prior biliary sphincterotomy with fluoroscopic examination suspicious for sludge with distal narrowing noted in the duct.  A balloon sphincteroplasty was performed with sludge in the biliary tree being swept out successfully.  There was an additional irregularity found in the ventral pancreatic duct in the head of the pancreas but evaluation of this was unsuccessful.  Gastroenterology recommended advancing diet as tolerated, observing for post ERCP complications and a repeat CT pancreas protocol in 3 to 4 weeks.  Days that followed patient's abdominal pain substantially improved to baseline levels.  Patient's diet was advanced successfully.  Patient was discharged in improved and stable condition on 05/24/2022 and advised to closely follow-up with [facility] gastroenterology and was additionally provided with an additional 2 days of oral ciprofloxacin at time of discharge.     Pain control - [employer] Controlled Substance Reporting System database was reviewed. and patient was instructed, not to drive, operate heavy  machinery, perform  activities at heights, swimming or participation in water activities or provide baby-sitting services while on Pain, Sleep and Anxiety Medications; until their outpatient Physician has advised to do so again. Also recommended to not to take more than prescribed Pain, Sleep and Anxiety Medications.   Consultants: Dr. Rush Landmark with [facility] Procedures performed: ERCP by Dr. Rush Landmark on  05/22/2022 Disposition: Home Diet recommendation:  Discharge Diet Orders (From admission, onward)     Start     Ordered   05/24/22 0000  Diet - low sodium heart healthy        05/24/22 1127           Cardiac diet  DISCHARGE MEDICATION: Allergies as of 05/24/2022       Reactions   Desipramine Nausea Only, Rash        Medication List     TAKE these medications    ciprofloxacin 500 MG tablet Commonly known as: CIPRO Take 1 tablet (500 mg total) by mouth 2 (two) times daily for 4 doses.   lipase/protease/amylase 36000 UNITS Cpep capsule Commonly known as: CREON Take 2 capsules (72,000 Units total) by mouth 3 (three) times daily with meals. What changed:  how much to take how to take this when to take this additional instructions   oxyCODONE 5 MG immediate release tablet Commonly known as: Oxy IR/ROXICODONE Take 1 tablet (5 mg total) by mouth every 6 (six) hours as needed for up to 3 days for severe pain.   pantoprazole 40 MG tablet Commonly known as: PROTONIX Take 1 tablet (40 mg total) by mouth daily. Start taking on: May 25, 2022        Follow-up Information     Ronnie Doss M, DO. Schedule an appointment as soon as possible for a visit in 1 week(s).   Specialty: Family Medicine Contact information: [place] [address] [phone]         [facility] Follow up.   Specialty: Gastroenterology Why: You will be contacted with a follow-up appointment with our gastroenterology.  If you are not  contacted within the next 7 days please call the listed phone number. Contact information: [place] [address] [phone]                Discharge Exam: Danley Danker Weights   05/17/22 2210 05/18/22 1520 05/22/22 1303  Weight: 96 kg 94.3 kg 94.3 kg    Constitutional: Awake alert and oriented x3, no associated distress.   Respiratory: clear to auscultation bilaterally, no wheezing, no crackles. Normal respiratory effort. No accessory muscle use.  Cardiovascular: Regular rate and rhythm, no murmurs / rubs / gallops. No extremity edema. 2+ pedal pulses. No carotid bruits.  Abdomen: some gastric tenderness.  Abdomen is soft.  No evidence of intra-abdominal masses.  Positive bowel sounds noted in all quadrants.   Musculoskeletal: No joint deformity upper and lower extremities. Good ROM, no contractures. Normal muscle tone.     Condition at discharge: fair  The results of significant diagnostics from this hospitalization (including imaging, microbiology, ancillary and laboratory) are listed below for reference.   Imaging Studies: DG ERCP  Result Date: 05/22/2022 CLINICAL DATA:  Acute pancreatitis. EXAM: ERCP COMPARISON:  ERCP, 07/24/2021. CT AP, 05/17/2022. MR CP, 05/19/2022. FLUOROSCOPY: Exposure Index (as provided by the fluoroscopic device): 230 mGy Kerma FINDINGS: Multiple, limited oblique planar images of the RIGHT upper quadrant obtained C-arm. Images demonstrating flexible endoscopy, biliary duct cannulation, retrograde cholangiogram and balloon sweep. Mild extrahepatic  biliary ductal dilation with distal common bile duct filling defect is suspected. Cholecystectomy clips. IMPRESSION: Fluoroscopic imaging for ERCP. Mild extrahepatic biliary ductal dilation, with distal common bile duct filling defects. For complete description of intra procedural findings, please see performing service dictation. Electronically Signed   By: Michaelle Birks M.D.   On:  05/22/2022 16:33   MR ABDOMEN MRCP W WO CONTAST  Result Date: 05/19/2022 CLINICAL DATA:  Acute pancreatitis EXAM: MRI ABDOMEN WITHOUT AND WITH CONTRAST (INCLUDING MRCP) TECHNIQUE: Multiplanar multisequence MR imaging of the abdomen was performed both before and after the administration of intravenous contrast. Heavily T2-weighted images of the biliary and pancreatic ducts were obtained, and three-dimensional MRCP images were rendered by post processing. CONTRAST:  22m GADAVIST GADOBUTROL 1 MMOL/ML IV SOLN COMPARISON:  CT abdomen/pelvis dated 05/17/2022 FINDINGS: Severely motion degraded images. Lower chest: Lung bases are clear. Hepatobiliary: Liver is within normal limits. No suspicious/enhancing hepatic lesions. No definite hepatic steatosis. Status post cholecystectomy. Mild intrahepatic ductal dilatation in the central left hepatic lobe (series 5/image 14). Dilated common duct, measuring 10 mm centrally. Although motion degraded, there is suspected choledocholithiasis versus debris in the distal CBD, measuring up to 13 mm (series 4/image 17). Pancreas: Peripancreatic inflammatory changes in this patient with known acute pancreatitis. Dominant 4.7 cm pseudocyst in the pancreatic body (series 5/image 19). Adjacent pseudocysts versus segmental ductal dilatation (series 5/image 19) in the proximal pancreatic tail. Segmental parenchymal atrophy in the distal pancreatic tail. Spleen:  Within normal limits. Adrenals/Urinary Tract:  Adrenal glands are within normal limits. Kidneys are within normal limits.  No hydronephrosis. Stomach/Bowel: Stomach is within normal limits. Visualized bowel is grossly unremarkable. Vascular/Lymphatic:  No evidence of abdominal aortic aneurysm. No suspicious abdominal lymphadenopathy. Other:  No abdominal ascites. Musculoskeletal: No focal osseous lesions. IMPRESSION: Severely motion degraded images, limiting evaluation. Acute pancreatitis with dominant 4.7 cm pseudocyst, as above.  Status post cholecystectomy. Mild intrahepatic and extrahepatic ductal dilatation. Common duct measures 10 mm. Suspected choledocholithiasis with 13 mm distal CBD stone (versus debris), as above. Consider ERCP as clinically warranted. Electronically Signed   By: SJulian HyM.D.   On: 05/19/2022 20:36   MR 3D Recon At Scanner  Result Date: 05/19/2022 CLINICAL DATA:  Acute pancreatitis EXAM: MRI ABDOMEN WITHOUT AND WITH CONTRAST (INCLUDING MRCP) TECHNIQUE: Multiplanar multisequence MR imaging of the abdomen was performed both before and after the administration of intravenous contrast. Heavily T2-weighted images of the biliary and pancreatic ducts were obtained, and three-dimensional MRCP images were rendered by post processing. CONTRAST:  969mGADAVIST GADOBUTROL 1 MMOL/ML IV SOLN COMPARISON:  CT abdomen/pelvis dated 05/17/2022 FINDINGS: Severely motion degraded images. Lower chest: Lung bases are clear. Hepatobiliary: Liver is within normal limits. No suspicious/enhancing hepatic lesions. No definite hepatic steatosis. Status post cholecystectomy. Mild intrahepatic ductal dilatation in the central left hepatic lobe (series 5/image 14). Dilated common duct, measuring 10 mm centrally. Although motion degraded, there is suspected choledocholithiasis versus debris in the distal CBD, measuring up to 13 mm (series 4/image 17). Pancreas: Peripancreatic inflammatory changes in this patient with known acute pancreatitis. Dominant 4.7 cm pseudocyst in the pancreatic body (series 5/image 19). Adjacent pseudocysts versus segmental ductal dilatation (series 5/image 19) in the proximal pancreatic tail. Segmental parenchymal atrophy in the distal pancreatic tail. Spleen:  Within normal limits. Adrenals/Urinary Tract:  Adrenal glands are within normal limits. Kidneys are within normal limits.  No hydronephrosis. Stomach/Bowel: Stomach is within normal limits. Visualized bowel is grossly unremarkable. Vascular/Lymphatic:   No evidence of abdominal  aortic aneurysm. No suspicious abdominal lymphadenopathy. Other:  No abdominal ascites. Musculoskeletal: No focal osseous lesions. IMPRESSION: Severely motion degraded images, limiting evaluation. Acute pancreatitis with dominant 4.7 cm pseudocyst, as above. Status post cholecystectomy. Mild intrahepatic and extrahepatic ductal dilatation. Common duct measures 10 mm. Suspected choledocholithiasis with 13 mm distal CBD stone (versus debris), as above. Consider ERCP as clinically warranted. Electronically Signed   By: Julian Hy M.D.   On: 05/19/2022 20:36   CT ABDOMEN PELVIS WO CONTRAST  Result Date: 05/17/2022 CLINICAL DATA:  Nausea and vomiting.  Pancreatitis. EXAM: CT ABDOMEN AND PELVIS WITHOUT CONTRAST TECHNIQUE: Multidetector CT imaging of the abdomen and pelvis was performed following the standard protocol without IV contrast. RADIATION DOSE REDUCTION: This exam was performed according to the departmental dose-optimization program which includes automated exposure control, adjustment of the mA and/or kV according to patient size and/or use of iterative reconstruction technique. COMPARISON:  09/25/2021 and 04/21/2022. FINDINGS: Lower chest: No acute abnormality. Hepatobiliary: Increase low-attenuation within the posterior aspect of the lateral segment of left hepatic lobe and medial segment of left hepatic lobe compared with 04/21/2022. This is favored to represent an area of increased focal fatty deposition, image 20/2. Status post cholecystectomy. Similar appearance of mild central intrahepatic bile duct dilatation. Pancreas: Peripancreatic soft tissue stranding is again identified compatible with acute pancreatitis. Previously noted pseudocyst centered around the neck of pancreas appears increased in size from previous exam measuring 4.0 x 4.0 cm, image 28/2. On the exam from 04/21/22 this measured 1.9 x 1.6 cm. Spleen: Normal in size without focal abnormality.  Adrenals/Urinary Tract: Normal adrenal glands. No nephrolithiasis, hydronephrosis or mass. Urinary bladder appears decompressed. Stomach/Bowel: Pancreatic pseudo cyst extends into the lesser sac and has mass effect upon the distal stomach. Stomach appears nondistended. No significant bowel wall thickening, inflammation, or distension. The appendix is visualized and appears normal. Vascular/Lymphatic: Normal appearance of the abdominal aorta. No signs of abdominopelvic adenopathy. Reproductive: Prostate is unremarkable. Other: Small fat containing umbilical hernia and small fat containing right inguinal hernia. No abdominopelvic ascites. Musculoskeletal: No acute or suspicious osseous findings. 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 Moderate L3-4 degenerative disc disease IMPRESSION: 1. Acute pancreatitis. 2. Interval increase in size of pseudocyst centered around the neck of pancreas. 3. Increase low-attenuation within the posterior aspect of the lateral segment of left hepatic lobe and medial segment of left hepatic lobe compared with 04/21/2022. This is favored to represent an area of increased focal fatty deposition. 4. 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 5. Small fat containing umbilical hernia and small fat containing right inguinal hernia. Electronically Signed   By: Kerby Moors M.D.   On: 05/17/2022 13:06    Microbiology: Results for orders placed or performed during the hospital encounter of 05/17/22  Culture, blood (routine x 2)     Status: None   Collection Time: 05/17/22  6:30 PM   Specimen: BLOOD  Result Value Ref Range Status   Specimen Description   Final    BLOOD SITE NOT SPECIFIED Performed at [facility] [address], [place], [place] [address]    Special Requests   Final    BOTTLES DRAWN AEROBIC AND ANAEROBIC Blood Culture results may not be optimal due to an excessive volume  of blood received in culture bottles Performed at [facility] [address], [place], [place] [address]    Culture   Final  NO GROWTH 5 DAYS Performed at [facility], [place] [address], [place], [place] [address]    Report Status 05/22/2022 FINAL  Final  Culture, blood (routine x 2)     Status: None   Collection Time: 05/17/22 10:54 PM   Specimen: BLOOD  Result Value Ref Range Status   Specimen Description   Final    BLOOD BLOOD LEFT FOREARM Performed at [facility] [address], [place], [place] [address]    Special Requests   Final    BOTTLES DRAWN AEROBIC AND ANAEROBIC Blood Culture adequate volume Performed at [facility] [address] [place], [place] [address]    Culture   Final    NO GROWTH 5 DAYS Performed at [facility], [place] [address], [place], [place] [address]    Report Status 05/23/2022 FINAL  Final  Body fluid culture w Gram Stain     Status: None (Preliminary result)   Collection Time: 05/22/22  4:22 PM   Specimen: Body Fluid  Result Value Ref Range Status   Specimen Description   Final    FLUID PANCREATIC NEC CYST Performed at [facility] [address] [place], [place] [address]    Special Requests   Final    NONE Performed at [facility], [place] [address], [place], [place] [address]    Gram Stain NO WBC SEEN NO ORGANISMS SEEN   Final   Culture   Final    NO GROWTH 2 DAYS Performed at [facility], [place] [address], [place], [place] [address]    Report Status PENDING  Incomplete    Labs: CBC: Recent Labs  Lab 05/20/22 0529 05/21/22 0559 05/22/22 0555 05/23/22 0617 05/24/22 0500  WBC 9.5 7.6 8.2 11.0* 12.4*  NEUTROABS 6.8 4.8 5.2 8.8* 8.6*  HGB 14.5 14.7 14.7 14.3 14.3  HCT 42.1 42.6 43.6 41.1 43.1  MCV 89.8 91.0 91.4 89.5 92.7  PLT 222 233 256 276 536   Basic Metabolic Panel: Recent Labs  Lab 05/20/22 0529  05/21/22 0559 05/22/22 0555 05/23/22 0617 05/24/22 0500  NA 137 138 138 136 138  K 3.3* 4.4 4.1 4.4 4.0  CL 99 102 103 102 103  CO2 31 33* '28 26 28  '$ GLUCOSE 131* 137* 127* 160* 132*  BUN '19 13 13 '$ 23* 21*  CREATININE 0.91 0.91 0.75 0.93 0.91  CALCIUM 8.3* 8.8* 9.0 8.8* 9.1  MG  --   --  2.1 2.1 2.3   Liver Function Tests: Recent Labs  Lab 05/20/22 0529 05/21/22 0559 05/22/22 0555 05/23/22 0617 05/24/22 0500  AST '17 16 19 23 22  '$ ALT '25 25 25 '$ 34 37  ALKPHOS 128* 128* 126 141* 130*  BILITOT 1.3* 1.0 0.7 0.9 0.4  PROT 6.0* 6.0* 6.3* 6.7 6.7  ALBUMIN 3.3* 3.4* 3.4* 3.5 3.6   CBG: Recent Labs  Lab 05/23/22 0748 05/23/22 1114 05/23/22 1651 05/23/22 2041 05/24/22 0730  GLUCAP 134* 107* 206* 111* 117*    Discharge time spent: greater than 30 minutes.  Signed: Vernelle Emerald, MD [facility] 05/24/2022

## 2022-05-24 NOTE — Progress Notes (Signed)
Progress Note   Subjective  Patient feels well this AM. Pain at baseline. He is tolerating soft food, wants to advance his diet. No fevers.    Objective   Vital signs in last 24 hours: Temp:  [97.4 F (36.3 C)-98.1 F (36.7 C)] 97.4 F (36.3 C) (11/19 0429) Pulse Rate:  [58-65] 58 (11/19 0429) Resp:  [14-18] 14 (11/19 0429) BP: (126-153)/(78-98) 153/98 (11/19 0429) SpO2:  [96 %-100 %] 100 % (11/19 0429) Last BM Date : 05/23/22 General:    white male in NAD Abdomen:  Soft, mild baseline TTP Neurologic:  Alert and oriented,  grossly normal neurologically. Psych:  Cooperative. Normal mood and affect.  Intake/Output from previous day: 11/18 0701 - 11/19 0700 In: 1200 [P.O.:1200] Out: -  Intake/Output this shift: Total I/O In: 30 [P.O.:30] Out: -   Lab Results: Recent Labs    05/22/22 0555 05/23/22 0617 05/24/22 0500  WBC 8.2 11.0* 12.4*  HGB 14.7 14.3 14.3  HCT 43.6 41.1 43.1  PLT 256 276 297   BMET Recent Labs    05/22/22 0555 05/23/22 0617 05/24/22 0500  NA 138 136 138  K 4.1 4.4 4.0  CL 103 102 103  CO2 '28 26 28  '$ GLUCOSE 127* 160* 132*  BUN 13 23* 21*  CREATININE 0.75 0.93 0.91  CALCIUM 9.0 8.8* 9.1   LFT Recent Labs    05/24/22 0500  PROT 6.7  ALBUMIN 3.6  AST 22  ALT 37  ALKPHOS 130*  BILITOT 0.4   PT/INR No results for input(s): "LABPROT", "INR" in the last 72 hours.  Studies/Results: DG ERCP  Result Date: 05/22/2022 CLINICAL DATA:  Acute pancreatitis. EXAM: ERCP COMPARISON:  ERCP, 07/24/2021. CT AP, 05/17/2022. MR CP, 05/19/2022. FLUOROSCOPY: Exposure Index (as provided by the fluoroscopic device): 230 mGy Kerma FINDINGS: Multiple, limited oblique planar images of the RIGHT upper quadrant obtained C-arm. Images demonstrating flexible endoscopy, biliary duct cannulation, retrograde cholangiogram and balloon sweep. Mild extrahepatic biliary ductal dilation with distal common bile duct filling defect is suspected. Cholecystectomy  clips. IMPRESSION: Fluoroscopic imaging for ERCP. Mild extrahepatic biliary ductal dilation, with distal common bile duct filling defects. For complete description of intra procedural findings, please see performing service dictation. Electronically Signed   By: Michaelle Birks M.D.   On: 05/22/2022 16:33       Assessment / Plan:    51 year old male with a history of severe necrotizing pancreatitis, history of multiple necrosectomy's, who has had recurrent pancreatitis and developed pancreatic cyst causing symptoms of worsening pain. Now s/p POD #2 EUS with cyst drainage yesterday along with sphincterotomy, CBD balloon dilation, removal of sludge from CBD. Concern for possible pancreatic duct disruption on ERCP.  Following intervention yesterday he continues to feel okay and back to baseline.  Tolerating soft diet and is hungry and wants to advance his diet to low fat regular, which we will do today. His WBC has trended up 1 point and his lipase remains elevated although he clinically is doing well and feeling better. He really wants to go home today, I think if tolerating his diet he can go home later today with close outpatient follow up and pre-cautions to return if he has any worsening symptoms, etc.  Continue Cipro 500 mg twice daily for another 2 days.  Dr. Rush Landmark would like to repeat the CT scan as an outpatient in 3 to 4 weeks.  Over time if the cyst does recur over time, options would be placement of  pigtail drain into the cyst endoscopically or consider surgical evaluation.     PLAN: - advance to low fat regular diet - continue creon - continue pain regimen - continue cipro '500mg'$  BID for another 2 days - if patient continues to feel well can discharge home later today - as outpatient, will need close follow up and CT scan in 3-4 weeks. Our office will coordinate.   Call with questions. We will reassess him tomorrow morning   Jolly Mango, MD [facility]

## 2022-05-24 NOTE — Progress Notes (Signed)
Steve Romero was given instructions on how to use the glucose monitor. Next medications due were written on his discharge papers. States he understands instructions and that he has prescriptions to pick up. IV's discontinued. Steve Romero was dishcarged via wheelchair to a friend.

## 2022-05-24 NOTE — Progress Notes (Signed)
Patient ambulating in the hall frequently, tolerating well.

## 2022-05-25 ENCOUNTER — Telehealth: Payer: Self-pay

## 2022-05-25 NOTE — Telephone Encounter (Signed)
-----   Message from Yetta Flock, MD sent at 05/24/2022  9:48 AM EST ----- Regarding: patient follow up Gabe - your patient did well over the weekend. As of now, plan is to discharge him later this afternoon unless he has any setbacks. WBC rose by 1 point but he felt good and really wanted to go home, did not think we needed to keep him for that.  I know you had suggested repeat CT scan in the upcoming 2-3 weeks. Keniel Ralston can you help coordinate a CT scan for him in 3 weeks or so? He should also have follow up with GM or APP in the upcoming weeks as well if you can coordinate.  Thanks Richardson Landry

## 2022-05-25 NOTE — Telephone Encounter (Signed)
Pt had CT on 05/17/22, does he need another in 3 weeks?

## 2022-05-26 ENCOUNTER — Other Ambulatory Visit: Payer: Self-pay

## 2022-05-26 ENCOUNTER — Telehealth: Payer: Self-pay

## 2022-05-26 DIAGNOSIS — Z8719 Personal history of other diseases of the digestive system: Secondary | ICD-10-CM

## 2022-05-26 LAB — BODY FLUID CULTURE W GRAM STAIN
Culture: NO GROWTH
Gram Stain: NONE SEEN

## 2022-05-26 NOTE — Telephone Encounter (Signed)
Transition Care Management Unsuccessful Follow-up Telephone Call  Date of discharge and from where:  05/24/2022 Steve Romero   Attempts:  1st Attempt  Reason for unsuccessful TCM follow-up call:  No answer/ no voicemail

## 2022-05-26 NOTE — Telephone Encounter (Signed)
Patty, Please order a 3 to 4-week follow-up CT abdomen pancreas protocol. Thanks. GM

## 2022-05-26 NOTE — Telephone Encounter (Signed)
The order has been entered for CT scan and sent to the schedulers.

## 2022-05-27 LAB — CYTOLOGY - NON PAP

## 2022-05-27 NOTE — Telephone Encounter (Signed)
Transition Care Management Unsuccessful Follow-up Telephone Call  Date of discharge and from where:  05/24/22 Steve Romero  Attempts:  2nd Attempt  Reason for unsuccessful TCM follow-up call:  No answer/busy

## 2022-06-02 NOTE — Telephone Encounter (Signed)
Transition Care Management Unsuccessful Follow-up Telephone Call  Date of discharge and from where:  05/24/22 [facility]   Attempts:  3rd Attempt  Reason for unsuccessful TCM follow-up call:  No answer/busy - patient has follow up appt on 06/09/22 with Hassell Done

## 2022-06-09 ENCOUNTER — Inpatient Hospital Stay: Payer: Medicaid Other | Admitting: Nurse Practitioner

## 2022-06-09 ENCOUNTER — Encounter: Payer: Self-pay | Admitting: Gastroenterology

## 2022-06-10 ENCOUNTER — Encounter: Payer: Self-pay | Admitting: Family Medicine

## 2022-06-11 ENCOUNTER — Ambulatory Visit: Payer: Medicaid Other | Admitting: Family Medicine

## 2022-06-11 ENCOUNTER — Encounter: Payer: Self-pay | Admitting: Family Medicine

## 2022-06-11 VITALS — BP 154/111 | HR 86 | Temp 98.1°F | Ht 70.0 in | Wt 197.0 lb

## 2022-06-11 DIAGNOSIS — K831 Obstruction of bile duct: Secondary | ICD-10-CM

## 2022-06-11 DIAGNOSIS — R932 Abnormal findings on diagnostic imaging of liver and biliary tract: Secondary | ICD-10-CM

## 2022-06-11 DIAGNOSIS — K859 Acute pancreatitis without necrosis or infection, unspecified: Secondary | ICD-10-CM | POA: Diagnosis not present

## 2022-06-11 DIAGNOSIS — R748 Abnormal levels of other serum enzymes: Secondary | ICD-10-CM

## 2022-06-11 DIAGNOSIS — K861 Other chronic pancreatitis: Secondary | ICD-10-CM

## 2022-06-11 DIAGNOSIS — R739 Hyperglycemia, unspecified: Secondary | ICD-10-CM

## 2022-06-11 MED ORDER — PANTOPRAZOLE SODIUM 40 MG PO TBEC: 40.0000 mg | DELAYED_RELEASE_TABLET | Freq: Every day | ORAL | 1 refills | Status: DC

## 2022-06-11 MED ORDER — ONDANSETRON 4 MG PO TBDP: 4.0000 mg | ORAL_TABLET | Freq: Three times a day (TID) | ORAL | 1 refills | Status: DC | PRN

## 2022-06-11 NOTE — Progress Notes (Signed)
BP (!) 154/111   Pulse 86   Temp 98.1 F (36.7 C)   Ht 5\' 10"  (1.778 m)   Wt 197 lb (89.4 kg)   SpO2 99%   BMI 28.27 kg/m    Subjective:   Patient ID: Steve Romero, male    DOB: December 22, 1970, 51 y.o.   MRN: [id number]  HPI: Lizbeth Wodrich is a 51 y.o. male presenting on 06/11/2022 for Hospitalization Follow-up (Acute pancreatitis) and Abdominal Pain (upper)   HPI Hospital follow-up Patient is coming in today for hospital follow-up.  He was admitted on 05/17/2022 and discharged on 05/24/2022 from the hospital.  During the hospital he was found to have acute on chronic pancreatitis and pseudocyst of the pancreas and acute kidney injury.  He had a ERCP and biopsy of the pseudocyst came back benign and was feeling better when he left the hospital.  He does follow-up with [facility] gastroenterology with Dr. Rosana Fret.  He has a CT scan scheduled for the 27th at [facility].  He says when he got out of the hospital initially he was doing better but now his pain is starting to do worse over the past 4 to 5 days.  He is not keeping down fluids and vomiting more.  He says he does keep down a little bit but not much.  He says his pain when he went in the first time was 6 out of 10 and it went down to as low as 2 out of 10 but now it starting to go back up to 4 out of 10.  He denies any fevers or chills.  He has had some vomiting especially over the past 2 days.  It comes later after he eats, not immediately but a few hours later the fever will come back up like he just sat there.  Relevant past medical, surgical, family and social history reviewed and updated as indicated. Interim medical history since our last visit reviewed. Allergies and medications reviewed and updated.  Review of Systems  Constitutional:  Positive for fatigue. Negative for chills and fever.  Respiratory:  Negative for shortness of breath and wheezing.   Cardiovascular:  Negative for chest pain and leg swelling.   Gastrointestinal:  Positive for abdominal distention, abdominal pain, nausea and vomiting. Negative for blood in stool, constipation and diarrhea.  Musculoskeletal:  Negative for back pain and gait problem.  Skin:  Negative for rash.  All other systems reviewed and are negative.   Per HPI unless specifically indicated above   Allergies as of 06/11/2022       Reactions   Desipramine Nausea Only, Rash        Medication List        Accurate as of June 11, 2022 11:57 AM. If you have any questions, ask your nurse or doctor.          lipase/protease/amylase 30865 UNITS Cpep capsule Commonly known as: CREON Take 2 capsules (72,000 Units total) by mouth 3 (three) times daily with meals.   ondansetron 4 MG disintegrating tablet Commonly known as: ZOFRAN-ODT Take 1 tablet (4 mg total) by mouth every 8 (eight) hours as needed for nausea or vomiting. Started by: Elige Radon Rebeca Valdivia, MD   pantoprazole 40 MG tablet Commonly known as: PROTONIX Take 1 tablet (40 mg total) by mouth daily.         Objective:   BP (!) 154/111   Pulse 86   Temp 98.1 F (36.7 C)   Ht 5\' 10"  (  1.778 m)   Wt 197 lb (89.4 kg)   SpO2 99%   BMI 28.27 kg/m   Wt Readings from Last 3 Encounters:  06/11/22 197 lb (89.4 kg)  05/22/22 207 lb 14.3 oz (94.3 kg)  04/14/22 213 lb 8 oz (96.8 kg)    Physical Exam Vitals and nursing note reviewed.  Constitutional:      General: He is not in acute distress.    Appearance: He is well-developed. He is not diaphoretic.  Eyes:     General: No scleral icterus.    Conjunctiva/sclera: Conjunctivae normal.  Neck:     Thyroid: No thyromegaly.  Cardiovascular:     Rate and Rhythm: Normal rate and regular rhythm.     Heart sounds: Normal heart sounds. No murmur heard. Pulmonary:     Effort: Pulmonary effort is normal. No respiratory distress.     Breath sounds: Normal breath sounds. No wheezing.  Abdominal:     General: Abdomen is flat. Bowel sounds are  normal. There is distension.     Palpations: Abdomen is soft.     Tenderness: There is abdominal tenderness in the right upper quadrant, epigastric area, periumbilical area and left upper quadrant. There is no right CVA tenderness, left CVA tenderness, guarding or rebound. Negative signs include Murphy's sign and McBurney's sign.     Hernia: No hernia is present.  Musculoskeletal:        General: Normal range of motion.     Cervical back: Neck supple.  Lymphadenopathy:     Cervical: No cervical adenopathy.  Skin:    General: Skin is warm and dry.     Findings: No rash.  Neurological:     Mental Status: He is alert and oriented to person, place, and time.     Coordination: Coordination normal.  Psychiatric:        Behavior: Behavior normal.       Assessment & Plan:   Problem List Items Addressed This Visit       Digestive   Acute on chronic pancreatitis (HCC) - Primary   Relevant Medications   ondansetron (ZOFRAN-ODT) 4 MG disintegrating tablet   pantoprazole (PROTONIX) 40 MG tablet   Other Relevant Orders   CBC with Differential/Platelet   CMP14+EGFR   Lipase   Biliary stricture   Relevant Medications   ondansetron (ZOFRAN-ODT) 4 MG disintegrating tablet   pantoprazole (PROTONIX) 40 MG tablet   Other Relevant Orders   CBC with Differential/Platelet   CMP14+EGFR   Lipase     Other   Abnormal CT of liver   Relevant Medications   ondansetron (ZOFRAN-ODT) 4 MG disintegrating tablet   pantoprazole (PROTONIX) 40 MG tablet   Other Relevant Orders   CBC with Differential/Platelet   CMP14+EGFR   Lipase   Elevated alkaline phosphatase level   Relevant Medications   ondansetron (ZOFRAN-ODT) 4 MG disintegrating tablet   pantoprazole (PROTONIX) 40 MG tablet   Other Relevant Orders   CBC with Differential/Platelet   CMP14+EGFR   Lipase   Other Visit Diagnoses     Elevated serum glucose       Relevant Medications   ondansetron (ZOFRAN-ODT) 4 MG disintegrating  tablet   pantoprazole (PROTONIX) 40 MG tablet   Other Relevant Orders   CBC with Differential/Platelet   CMP14+EGFR   Lipase     Concerned that he is getting worse and the pancreatitis is flaring back up again.  Patient did not have anything for nausea and will give him Zofran and  given Protonix back and see if we can calm it down a little bit so he can keep fluids down but if he continues to not keep fluids down he is going to need to go to the emergency department later today.  Follow up plan: Return in about 2 weeks (around 06/25/2022), or if symptoms worsen or fail to improve, for Follow-up with PCP pancreatitis.  Counseling provided for all of the vaccine components Orders Placed This Encounter  Procedures   CBC with Differential/Platelet   CMP14+EGFR   Lipase    Arville Care, MD [facility] 06/11/2022, 11:58 AM

## 2022-06-12 ENCOUNTER — Encounter (HOSPITAL_COMMUNITY): Payer: Self-pay

## 2022-06-12 ENCOUNTER — Inpatient Hospital Stay (HOSPITAL_COMMUNITY)
Admission: EM | Admit: 2022-06-12 | Discharge: 2022-06-18 | DRG: 438 | Disposition: A | Discharge status: Home / Self Care | Payer: Medicaid Other | Attending: Internal Medicine | Admitting: Internal Medicine

## 2022-06-12 ENCOUNTER — Emergency Department (HOSPITAL_COMMUNITY): Payer: Medicaid Other

## 2022-06-12 ENCOUNTER — Other Ambulatory Visit: Payer: Self-pay

## 2022-06-12 DIAGNOSIS — Z79899 Other long term (current) drug therapy: Secondary | ICD-10-CM

## 2022-06-12 DIAGNOSIS — F17210 Nicotine dependence, cigarettes, uncomplicated: Secondary | ICD-10-CM | POA: Diagnosis present

## 2022-06-12 DIAGNOSIS — E1159 Type 2 diabetes mellitus with other circulatory complications: Secondary | ICD-10-CM | POA: Diagnosis present

## 2022-06-12 DIAGNOSIS — G8929 Other chronic pain: Secondary | ICD-10-CM | POA: Diagnosis present

## 2022-06-12 DIAGNOSIS — Z823 Family history of stroke: Secondary | ICD-10-CM

## 2022-06-12 DIAGNOSIS — E861 Hypovolemia: Secondary | ICD-10-CM | POA: Diagnosis present

## 2022-06-12 DIAGNOSIS — N179 Acute kidney failure, unspecified: Secondary | ICD-10-CM | POA: Diagnosis present

## 2022-06-12 DIAGNOSIS — K859 Acute pancreatitis without necrosis or infection, unspecified: Secondary | ICD-10-CM

## 2022-06-12 DIAGNOSIS — R109 Unspecified abdominal pain: Secondary | ICD-10-CM | POA: Diagnosis present

## 2022-06-12 DIAGNOSIS — A419 Sepsis, unspecified organism: Secondary | ICD-10-CM

## 2022-06-12 DIAGNOSIS — D751 Secondary polycythemia: Secondary | ICD-10-CM | POA: Diagnosis present

## 2022-06-12 DIAGNOSIS — K861 Other chronic pancreatitis: Secondary | ICD-10-CM | POA: Diagnosis present

## 2022-06-12 DIAGNOSIS — K5903 Drug induced constipation: Secondary | ICD-10-CM | POA: Diagnosis present

## 2022-06-12 DIAGNOSIS — Y848 Other medical procedures as the cause of abnormal reaction of the patient, or of later complication, without mention of misadventure at the time of the procedure: Secondary | ICD-10-CM | POA: Diagnosis present

## 2022-06-12 DIAGNOSIS — G4730 Sleep apnea, unspecified: Secondary | ICD-10-CM | POA: Diagnosis present

## 2022-06-12 DIAGNOSIS — E785 Hyperlipidemia, unspecified: Secondary | ICD-10-CM | POA: Diagnosis present

## 2022-06-12 DIAGNOSIS — E871 Hypo-osmolality and hyponatremia: Secondary | ICD-10-CM | POA: Diagnosis present

## 2022-06-12 DIAGNOSIS — J449 Chronic obstructive pulmonary disease, unspecified: Secondary | ICD-10-CM | POA: Diagnosis present

## 2022-06-12 DIAGNOSIS — Z833 Family history of diabetes mellitus: Secondary | ICD-10-CM

## 2022-06-12 DIAGNOSIS — Z9049 Acquired absence of other specified parts of digestive tract: Secondary | ICD-10-CM

## 2022-06-12 DIAGNOSIS — Z8249 Family history of ischemic heart disease and other diseases of the circulatory system: Secondary | ICD-10-CM

## 2022-06-12 DIAGNOSIS — K8591 Acute pancreatitis with uninfected necrosis, unspecified: Secondary | ICD-10-CM | POA: Diagnosis present

## 2022-06-12 DIAGNOSIS — R112 Nausea with vomiting, unspecified: Secondary | ICD-10-CM

## 2022-06-12 DIAGNOSIS — K2210 Ulcer of esophagus without bleeding: Secondary | ICD-10-CM | POA: Diagnosis present

## 2022-06-12 DIAGNOSIS — K8581 Other acute pancreatitis with uninfected necrosis: Secondary | ICD-10-CM | POA: Diagnosis present

## 2022-06-12 DIAGNOSIS — K8050 Calculus of bile duct without cholangitis or cholecystitis without obstruction: Secondary | ICD-10-CM | POA: Diagnosis present

## 2022-06-12 DIAGNOSIS — I152 Hypertension secondary to endocrine disorders: Secondary | ICD-10-CM | POA: Diagnosis present

## 2022-06-12 DIAGNOSIS — K209 Esophagitis, unspecified without bleeding: Secondary | ICD-10-CM | POA: Insufficient documentation

## 2022-06-12 DIAGNOSIS — Z888 Allergy status to other drugs, medicaments and biological substances status: Secondary | ICD-10-CM

## 2022-06-12 DIAGNOSIS — F319 Bipolar disorder, unspecified: Secondary | ICD-10-CM | POA: Diagnosis present

## 2022-06-12 DIAGNOSIS — T402X5A Adverse effect of other opioids, initial encounter: Secondary | ICD-10-CM | POA: Diagnosis present

## 2022-06-12 DIAGNOSIS — R1114 Bilious vomiting: Secondary | ICD-10-CM

## 2022-06-12 DIAGNOSIS — I10 Essential (primary) hypertension: Secondary | ICD-10-CM | POA: Diagnosis present

## 2022-06-12 DIAGNOSIS — R7989 Other specified abnormal findings of blood chemistry: Secondary | ICD-10-CM

## 2022-06-12 DIAGNOSIS — N400 Enlarged prostate without lower urinary tract symptoms: Secondary | ICD-10-CM | POA: Diagnosis present

## 2022-06-12 DIAGNOSIS — K863 Pseudocyst of pancreas: Principal | ICD-10-CM | POA: Diagnosis present

## 2022-06-12 DIAGNOSIS — F259 Schizoaffective disorder, unspecified: Secondary | ICD-10-CM | POA: Diagnosis present

## 2022-06-12 DIAGNOSIS — E860 Dehydration: Secondary | ICD-10-CM | POA: Diagnosis present

## 2022-06-12 DIAGNOSIS — K2100 Gastro-esophageal reflux disease with esophagitis, without bleeding: Secondary | ICD-10-CM | POA: Diagnosis present

## 2022-06-12 DIAGNOSIS — E876 Hypokalemia: Secondary | ICD-10-CM | POA: Diagnosis present

## 2022-06-12 LAB — CBC WITH DIFFERENTIAL/PLATELET
Abs Immature Granulocytes: 0.11 10*3/uL — ABNORMAL HIGH (ref 0.00–0.07)
Basophils Absolute: 0.1 10*3/uL (ref 0.0–0.2)
Basophils Absolute: 0.1 10*3/uL (ref 0.0–0.1)
Basophils Relative: 1 %
Basos: 0 %
EOS (ABSOLUTE): 0.1 10*3/uL (ref 0.0–0.4)
Eos: 0 %
Eosinophils Absolute: 0.1 10*3/uL (ref 0.0–0.5)
Eosinophils Relative: 1 %
HCT: 51.9 % (ref 39.0–52.0)
Hematocrit: 58.2 % — ABNORMAL HIGH (ref 37.5–51.0)
Hemoglobin: 19.9 g/dL — ABNORMAL HIGH (ref 13.0–17.7)
Hemoglobin: 18.3 g/dL — ABNORMAL HIGH (ref 13.0–17.0)
Immature Grans (Abs): 0.1 10*3/uL (ref 0.0–0.1)
Immature Granulocytes: 1 %
Immature Granulocytes: 1 %
Lymphocytes Absolute: 1.6 10*3/uL (ref 0.7–3.1)
Lymphocytes Relative: 17 %
Lymphs Abs: 2.5 10*3/uL (ref 0.7–4.0)
Lymphs: 13 %
MCH: 30.9 pg (ref 26.6–33.0)
MCH: 30.9 pg (ref 26.0–34.0)
MCHC: 34.2 g/dL (ref 31.5–35.7)
MCHC: 35.3 g/dL (ref 30.0–36.0)
MCV: 91 fL (ref 79–97)
MCV: 87.5 fL (ref 80.0–100.0)
Monocytes Absolute: 0.7 10*3/uL (ref 0.1–0.9)
Monocytes Absolute: 1 10*3/uL (ref 0.1–1.0)
Monocytes Relative: 7 %
Monocytes: 6 %
Neutro Abs: 11.1 10*3/uL — ABNORMAL HIGH (ref 1.7–7.7)
Neutrophils Absolute: 9.7 10*3/uL — ABNORMAL HIGH (ref 1.4–7.0)
Neutrophils Relative %: 73 %
Neutrophils: 80 %
Platelets: 426 10*3/uL (ref 150–450)
Platelets: 439 10*3/uL — ABNORMAL HIGH (ref 150–400)
RBC: 6.43 x10E6/uL — ABNORMAL HIGH (ref 4.14–5.80)
RBC: 5.93 MIL/uL — ABNORMAL HIGH (ref 4.22–5.81)
RDW: 13.2 % (ref 11.6–15.4)
RDW: 12.3 % (ref 11.5–15.5)
WBC: 12.2 10*3/uL — ABNORMAL HIGH (ref 3.4–10.8)
WBC: 14.9 10*3/uL — ABNORMAL HIGH (ref 4.0–10.5)
nRBC: 0 % (ref 0.0–0.2)

## 2022-06-12 LAB — URINALYSIS, ROUTINE W REFLEX MICROSCOPIC
Bacteria, UA: NONE SEEN
Bilirubin Urine: NEGATIVE
Glucose, UA: NEGATIVE mg/dL
Hgb urine dipstick: NEGATIVE
Ketones, ur: NEGATIVE mg/dL
Leukocytes,Ua: NEGATIVE
Nitrite: NEGATIVE
Protein, ur: 30 mg/dL — AB
Specific Gravity, Urine: 1.028 (ref 1.005–1.030)
pH: 5 (ref 5.0–8.0)

## 2022-06-12 LAB — COMPREHENSIVE METABOLIC PANEL
ALT: 152 U/L — ABNORMAL HIGH (ref 0–44)
AST: 27 U/L (ref 15–41)
Albumin: 4.1 g/dL (ref 3.5–5.0)
Alkaline Phosphatase: 703 U/L — ABNORMAL HIGH (ref 38–126)
Anion gap: 15 (ref 5–15)
BUN: 39 mg/dL — ABNORMAL HIGH (ref 6–20)
CO2: 26 mmol/L (ref 22–32)
Calcium: 9.5 mg/dL (ref 8.9–10.3)
Chloride: 93 mmol/L — ABNORMAL LOW (ref 98–111)
Creatinine, Ser: 1.64 mg/dL — ABNORMAL HIGH (ref 0.61–1.24)
GFR, Estimated: 50 mL/min — ABNORMAL LOW (ref >60)
Glucose, Bld: 194 mg/dL — ABNORMAL HIGH (ref 70–99)
Potassium: 2.9 mmol/L — ABNORMAL LOW (ref 3.5–5.1)
Sodium: 134 mmol/L — ABNORMAL LOW (ref 135–145)
Total Bilirubin: 0.8 mg/dL (ref 0.3–1.2)
Total Protein: 8.2 g/dL — ABNORMAL HIGH (ref 6.5–8.1)

## 2022-06-12 LAB — CMP14+EGFR
ALT: 266 IU/L — ABNORMAL HIGH (ref 0–44)
AST: 43 IU/L — ABNORMAL HIGH (ref 0–40)
Albumin/Globulin Ratio: 1.5 (ref 1.2–2.2)
Albumin: 4.8 g/dL (ref 3.8–4.9)
Alkaline Phosphatase: 1182 IU/L (ref 44–121)
BUN/Creatinine Ratio: 23 — ABNORMAL HIGH (ref 9–20)
BUN: 25 mg/dL — ABNORMAL HIGH (ref 6–24)
Bilirubin Total: 0.6 mg/dL (ref 0.0–1.2)
CO2: 24 mmol/L (ref 20–29)
Calcium: 10.1 mg/dL (ref 8.7–10.2)
Chloride: 94 mmol/L — ABNORMAL LOW (ref 96–106)
Creatinine, Ser: 1.07 mg/dL (ref 0.76–1.27)
Globulin, Total: 3.3 g/dL (ref 1.5–4.5)
Glucose: 187 mg/dL — ABNORMAL HIGH (ref 70–99)
Potassium: 4 mmol/L (ref 3.5–5.2)
Sodium: 140 mmol/L (ref 134–144)
Total Protein: 8.1 g/dL (ref 6.0–8.5)
eGFR: 84 mL/min/{1.73_m2} (ref >59)

## 2022-06-12 LAB — MAGNESIUM: Magnesium: 2.4 mg/dL (ref 1.7–2.4)

## 2022-06-12 LAB — LIPASE: Lipase: 493 U/L — ABNORMAL HIGH (ref 13–78)

## 2022-06-12 LAB — LIPASE, BLOOD: Lipase: 396 U/L — ABNORMAL HIGH (ref 11–51)

## 2022-06-12 MED ORDER — MAGNESIUM SULFATE 2 GM/50ML IV SOLN
2.0000 g | Freq: Once | INTRAVENOUS | Status: AC
  Administered 2022-06-12: 2 g via INTRAVENOUS
  Filled 2022-06-12: qty 50

## 2022-06-12 MED ORDER — POTASSIUM CHLORIDE 10 MEQ/100ML IV SOLN
10.0000 meq | INTRAVENOUS | Status: AC
  Administered 2022-06-12 – 2022-06-13 (×5): 10 meq via INTRAVENOUS
  Filled 2022-06-12 (×5): qty 100

## 2022-06-12 MED ORDER — SODIUM CHLORIDE 0.9 % IV SOLN: INTRAVENOUS | Status: DC

## 2022-06-12 MED ORDER — FENTANYL CITRATE PF 50 MCG/ML IJ SOSY
50.0000 ug | PREFILLED_SYRINGE | Freq: Once | INTRAMUSCULAR | Status: AC
  Administered 2022-06-12: 50 ug via INTRAVENOUS
  Filled 2022-06-12: qty 1

## 2022-06-12 MED ORDER — IOHEXOL 300 MG/ML  SOLN
100.0000 mL | Freq: Once | INTRAMUSCULAR | Status: AC | PRN
  Administered 2022-06-13: 100 mL via INTRAVENOUS

## 2022-06-12 MED ORDER — DROPERIDOL 2.5 MG/ML IJ SOLN
1.2500 mg | Freq: Once | INTRAMUSCULAR | Status: AC
  Administered 2022-06-12: 1.25 mg via INTRAVENOUS
  Filled 2022-06-12: qty 2

## 2022-06-12 NOTE — ED Triage Notes (Signed)
Pt states that he had labs drawn yesterday and his liver and pancreas labs were messed up per his doctor. Pt states that he is having nausea, vomiting, and diarrhea along with abdominal pain x 3 weeks.

## 2022-06-12 NOTE — ED Notes (Signed)
Pt was unable to provide sample at this time. Pt was provided with a specimen cup and informed that a sample is needed.

## 2022-06-12 NOTE — ED Provider Notes (Signed)
[facility] DEPT Provider Note   CSN: [id number] Arrival date & time: 06/12/22  1805     History  Chief Complaint  Patient presents with   abnormal labs    Steve Romero is a 51 y.o. male.  The history is provided by the patient.  Abdominal Pain Pain location:  Periumbilical Pain radiates to:  Does not radiate Pain severity:  Severe Timing:  Constant Progression:  Unchanged Chronicity:  Recurrent Context: not alcohol use and not diet changes   Relieved by:  Nothing Worsened by:  Nothing Ineffective treatments:  None tried Associated symptoms: diarrhea, nausea and vomiting   Associated symptoms: no fever   Risk factors: no NSAID use        Home Medications Prior to Admission medications   Medication Sig Start Date End Date Taking? Authorizing Provider  lipase/protease/amylase (CREON) 36000 UNITS CPEP capsule Take 2 capsules (72,000 Units total) by mouth 3 (three) times daily with meals. 05/24/22   Shalhoub, Sherryll Burger, MD  ondansetron (ZOFRAN-ODT) 4 MG disintegrating tablet Take 1 tablet (4 mg total) by mouth every 8 (eight) hours as needed for nausea or vomiting. 06/11/22   Dettinger, Fransisca Kaufmann, MD  pantoprazole (PROTONIX) 40 MG tablet Take 1 tablet (40 mg total) by mouth daily. 06/11/22   Dettinger, Fransisca Kaufmann, MD      Allergies    Desipramine    Review of Systems   Review of Systems  Constitutional:  Negative for fever.  HENT:  Negative for facial swelling.   Eyes:  Negative for redness.  Gastrointestinal:  Positive for abdominal pain, diarrhea, nausea and vomiting.  All other systems reviewed and are negative.   Physical Exam Updated Vital Signs BP (!) 130/107 (BP Location: Right Arm)   Pulse (!) 117   Temp 98.4 F (36.9 C) (Oral)   Resp 16   SpO2 97%  Physical Exam Vitals and nursing note reviewed.  Constitutional:      General: He is not in acute distress.    Appearance: Normal appearance. He is well-developed. He is not  diaphoretic.  HENT:     Head: Normocephalic and atraumatic.     Nose: Nose normal.  Eyes:     Conjunctiva/sclera: Conjunctivae normal.     Pupils: Pupils are equal, round, and reactive to light.  Cardiovascular:     Rate and Rhythm: Normal rate and regular rhythm.     Pulses: Normal pulses.     Heart sounds: Normal heart sounds.  Pulmonary:     Effort: Pulmonary effort is normal.     Breath sounds: Normal breath sounds. No wheezing or rales.  Abdominal:     General: Bowel sounds are normal.     Palpations: Abdomen is soft.     Tenderness: There is no abdominal tenderness. There is no guarding or rebound.  Musculoskeletal:        General: Normal range of motion.     Cervical back: Normal range of motion and neck supple.  Skin:    General: Skin is warm and dry.     Capillary Refill: Capillary refill takes less than 2 seconds.  Neurological:     General: No focal deficit present.     Mental Status: He is alert and oriented to person, place, and time.     Deep Tendon Reflexes: Reflexes normal.  Psychiatric:        Mood and Affect: Mood normal.        Behavior: Behavior normal.  ED Results / Procedures / Treatments   Labs (all labs ordered are listed, but only abnormal results are displayed) Results for orders placed or performed during the hospital encounter of 06/12/22  Comprehensive metabolic panel  Result Value Ref Range   Sodium 134 (L) 135 - 145 mmol/L   Potassium 2.9 (L) 3.5 - 5.1 mmol/L   Chloride 93 (L) 98 - 111 mmol/L   CO2 26 22 - 32 mmol/L   Glucose, Bld 194 (H) 70 - 99 mg/dL   BUN 39 (H) 6 - 20 mg/dL   Creatinine, Ser 1.64 (H) 0.61 - 1.24 mg/dL   Calcium 9.5 8.9 - 10.3 mg/dL   Total Protein 8.2 (H) 6.5 - 8.1 g/dL   Albumin 4.1 3.5 - 5.0 g/dL   AST 27 15 - 41 U/L   ALT 152 (H) 0 - 44 U/L   Alkaline Phosphatase 703 (H) 38 - 126 U/L   Total Bilirubin 0.8 0.3 - 1.2 mg/dL   GFR, Estimated 50 (L) >60 mL/min   Anion gap 15 5 - 15  Lipase, blood  Result  Value Ref Range   Lipase 396 (H) 11 - 51 U/L  CBC with Diff  Result Value Ref Range   WBC 14.9 (H) 4.0 - 10.5 K/uL   RBC 5.93 (H) 4.22 - 5.81 MIL/uL   Hemoglobin 18.3 (H) 13.0 - 17.0 g/dL   HCT 51.9 39.0 - 52.0 %   MCV 87.5 80.0 - 100.0 fL   MCH 30.9 26.0 - 34.0 pg   MCHC 35.3 30.0 - 36.0 g/dL   RDW 12.3 11.5 - 15.5 %   Platelets 439 (H) 150 - 400 K/uL   nRBC 0.0 0.0 - 0.2 %   Neutrophils Relative % 73 %   Neutro Abs 11.1 (H) 1.7 - 7.7 K/uL   Lymphocytes Relative 17 %   Lymphs Abs 2.5 0.7 - 4.0 K/uL   Monocytes Relative 7 %   Monocytes Absolute 1.0 0.1 - 1.0 K/uL   Eosinophils Relative 1 %   Eosinophils Absolute 0.1 0.0 - 0.5 K/uL   Basophils Relative 1 %   Basophils Absolute 0.1 0.0 - 0.1 K/uL   Immature Granulocytes 1 %   Abs Immature Granulocytes 0.11 (H) 0.00 - 0.07 K/uL  Urinalysis, Routine w reflex microscopic  Result Value Ref Range   Color, Urine AMBER (A) YELLOW   APPearance HAZY (A) CLEAR   Specific Gravity, Urine 1.028 1.005 - 1.030   pH 5.0 5.0 - 8.0   Glucose, UA NEGATIVE NEGATIVE mg/dL   Hgb urine dipstick NEGATIVE NEGATIVE   Bilirubin Urine NEGATIVE NEGATIVE   Ketones, ur NEGATIVE NEGATIVE mg/dL   Protein, ur 30 (A) NEGATIVE mg/dL   Nitrite NEGATIVE NEGATIVE   Leukocytes,Ua NEGATIVE NEGATIVE   RBC / HPF 0-5 0 - 5 RBC/hpf   WBC, UA 0-5 0 - 5 WBC/hpf   Bacteria, UA NONE SEEN NONE SEEN   Squamous Epithelial / LPF 0-5 0 - 5   Mucus PRESENT    Hyaline Casts, UA PRESENT   Magnesium  Result Value Ref Range   Magnesium 2.4 1.7 - 2.4 mg/dL   DG ERCP  Result Date: 05/22/2022 CLINICAL DATA:  Acute pancreatitis. EXAM: ERCP COMPARISON:  ERCP, 07/24/2021. CT AP, 05/17/2022. MR CP, 05/19/2022. FLUOROSCOPY: Exposure Index (as provided by the fluoroscopic device): 230 mGy Kerma FINDINGS: Multiple, limited oblique planar images of the RIGHT upper quadrant obtained C-arm. Images demonstrating flexible endoscopy, biliary duct cannulation, retrograde cholangiogram and  balloon sweep.  Mild extrahepatic biliary ductal dilation with distal common bile duct filling defect is suspected. Cholecystectomy clips. IMPRESSION: Fluoroscopic imaging for ERCP. Mild extrahepatic biliary ductal dilation, with distal common bile duct filling defects. For complete description of intra procedural findings, please see performing service dictation. Electronically Signed   By: Michaelle Birks M.D.   On: 05/22/2022 16:33   MR ABDOMEN MRCP W WO CONTAST  Result Date: 05/19/2022 CLINICAL DATA:  Acute pancreatitis EXAM: MRI ABDOMEN WITHOUT AND WITH CONTRAST (INCLUDING MRCP) TECHNIQUE: Multiplanar multisequence MR imaging of the abdomen was performed both before and after the administration of intravenous contrast. Heavily T2-weighted images of the biliary and pancreatic ducts were obtained, and three-dimensional MRCP images were rendered by post processing. CONTRAST:  94m GADAVIST GADOBUTROL 1 MMOL/ML IV SOLN COMPARISON:  CT abdomen/pelvis dated 05/17/2022 FINDINGS: Severely motion degraded images. Lower chest: Lung bases are clear. Hepatobiliary: Liver is within normal limits. No suspicious/enhancing hepatic lesions. No definite hepatic steatosis. Status post cholecystectomy. Mild intrahepatic ductal dilatation in the central left hepatic lobe (series 5/image 14). Dilated common duct, measuring 10 mm centrally. Although motion degraded, there is suspected choledocholithiasis versus debris in the distal CBD, measuring up to 13 mm (series 4/image 17). Pancreas: Peripancreatic inflammatory changes in this patient with known acute pancreatitis. Dominant 4.7 cm pseudocyst in the pancreatic body (series 5/image 19). Adjacent pseudocysts versus segmental ductal dilatation (series 5/image 19) in the proximal pancreatic tail. Segmental parenchymal atrophy in the distal pancreatic tail. Spleen:  Within normal limits. Adrenals/Urinary Tract:  Adrenal glands are within normal limits. Kidneys are within normal  limits.  No hydronephrosis. Stomach/Bowel: Stomach is within normal limits. Visualized bowel is grossly unremarkable. Vascular/Lymphatic:  No evidence of abdominal aortic aneurysm. No suspicious abdominal lymphadenopathy. Other:  No abdominal ascites. Musculoskeletal: No focal osseous lesions. IMPRESSION: Severely motion degraded images, limiting evaluation. Acute pancreatitis with dominant 4.7 cm pseudocyst, as above. Status post cholecystectomy. Mild intrahepatic and extrahepatic ductal dilatation. Common duct measures 10 mm. Suspected choledocholithiasis with 13 mm distal CBD stone (versus debris), as above. Consider ERCP as clinically warranted. Electronically Signed   By: SJulian HyM.D.   On: 05/19/2022 20:36   MR 3D Recon At Scanner  Result Date: 05/19/2022 CLINICAL DATA:  Acute pancreatitis EXAM: MRI ABDOMEN WITHOUT AND WITH CONTRAST (INCLUDING MRCP) TECHNIQUE: Multiplanar multisequence MR imaging of the abdomen was performed both before and after the administration of intravenous contrast. Heavily T2-weighted images of the biliary and pancreatic ducts were obtained, and three-dimensional MRCP images were rendered by post processing. CONTRAST:  980mGADAVIST GADOBUTROL 1 MMOL/ML IV SOLN COMPARISON:  CT abdomen/pelvis dated 05/17/2022 FINDINGS: Severely motion degraded images. Lower chest: Lung bases are clear. Hepatobiliary: Liver is within normal limits. No suspicious/enhancing hepatic lesions. No definite hepatic steatosis. Status post cholecystectomy. Mild intrahepatic ductal dilatation in the central left hepatic lobe (series 5/image 14). Dilated common duct, measuring 10 mm centrally. Although motion degraded, there is suspected choledocholithiasis versus debris in the distal CBD, measuring up to 13 mm (series 4/image 17). Pancreas: Peripancreatic inflammatory changes in this patient with known acute pancreatitis. Dominant 4.7 cm pseudocyst in the pancreatic body (series 5/image 19). Adjacent  pseudocysts versus segmental ductal dilatation (series 5/image 19) in the proximal pancreatic tail. Segmental parenchymal atrophy in the distal pancreatic tail. Spleen:  Within normal limits. Adrenals/Urinary Tract:  Adrenal glands are within normal limits. Kidneys are within normal limits.  No hydronephrosis. Stomach/Bowel: Stomach is within normal limits. Visualized bowel is grossly unremarkable. Vascular/Lymphatic:  No evidence of  abdominal aortic aneurysm. No suspicious abdominal lymphadenopathy. Other:  No abdominal ascites. Musculoskeletal: No focal osseous lesions. IMPRESSION: Severely motion degraded images, limiting evaluation. Acute pancreatitis with dominant 4.7 cm pseudocyst, as above. Status post cholecystectomy. Mild intrahepatic and extrahepatic ductal dilatation. Common duct measures 10 mm. Suspected choledocholithiasis with 13 mm distal CBD stone (versus debris), as above. Consider ERCP as clinically warranted. Electronically Signed   By: Julian Hy M.D.   On: 05/19/2022 20:36   CT ABDOMEN PELVIS WO CONTRAST  Result Date: 05/17/2022 CLINICAL DATA:  Nausea and vomiting.  Pancreatitis. EXAM: CT ABDOMEN AND PELVIS WITHOUT CONTRAST TECHNIQUE: Multidetector CT imaging of the abdomen and pelvis was performed following the standard protocol without IV contrast. RADIATION DOSE REDUCTION: This exam was performed according to the departmental dose-optimization program which includes automated exposure control, adjustment of the mA and/or kV according to patient size and/or use of iterative reconstruction technique. COMPARISON:  09/25/2021 and 04/21/2022. FINDINGS: Lower chest: No acute abnormality. Hepatobiliary: Increase low-attenuation within the posterior aspect of the lateral segment of left hepatic lobe and medial segment of left hepatic lobe compared with 04/21/2022. This is favored to represent an area of increased focal fatty deposition, image 20/2. Status post cholecystectomy. Similar  appearance of mild central intrahepatic bile duct dilatation. Pancreas: Peripancreatic soft tissue stranding is again identified compatible with acute pancreatitis. Previously noted pseudocyst centered around the neck of pancreas appears increased in size from previous exam measuring 4.0 x 4.0 cm, image 28/2. On the exam from 04/21/22 this measured 1.9 x 1.6 cm. Spleen: Normal in size without focal abnormality. Adrenals/Urinary Tract: Normal adrenal glands. No nephrolithiasis, hydronephrosis or mass. Urinary bladder appears decompressed. Stomach/Bowel: Pancreatic pseudo cyst extends into the lesser sac and has mass effect upon the distal stomach. Stomach appears nondistended. No significant bowel wall thickening, inflammation, or distension. The appendix is visualized and appears normal. Vascular/Lymphatic: Normal appearance of the abdominal aorta. No signs of abdominopelvic adenopathy. Reproductive: Prostate is unremarkable. Other: Small fat containing umbilical hernia and small fat containing right inguinal hernia. No abdominopelvic ascites. Musculoskeletal: No acute or suspicious osseous findings. 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 Moderate L3-4 degenerative disc disease IMPRESSION: 1. Acute pancreatitis. 2. Interval increase in size of pseudocyst centered around the neck of pancreas. 3. Increase low-attenuation within the posterior aspect of the lateral segment of left hepatic lobe and medial segment of left hepatic lobe compared with 04/21/2022. This is favored to represent an area of increased focal fatty deposition. 4. 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 5. Small fat containing umbilical hernia and small fat containing right inguinal hernia. Electronically Signed   By: Kerby Moors M.D.   On: 05/17/2022 13:06    EKG EKG Interpretation  Date/Time:  Friday June 12 2022 23:14:16 EST Ventricular Rate:  91 PR  Interval:  168 QRS Duration: 104 QT Interval:  377 QTC Calculation: 464 R Axis:   77 Text Interpretation: Sinus rhythm RAE Confirmed by Randal Buba, Kember Boch ([id number]) on 06/12/2022 11:17:26 PM  Radiology No results found.  Procedures Procedures    Medications Ordered in ED Medications  0.9 %  sodium chloride infusion (has no administration in time range)  fentaNYL (SUBLIMAZE) injection 50 mcg (has no administration in time range)  magnesium sulfate IVPB 2 g 50 mL (has no administration in time range)  potassium chloride 10 mEq in 100 mL IVPB (has no administration in time range)  droperidol (INAPSINE) 2.5 MG/ML injection  1.25 mg (has no administration in time range)    ED Course/ Medical Decision Making/ A&P                           Medical Decision Making Patient with recurrent pancreatitis.  Called by GI and told numbers were off and to be seen  Problems Addressed: Acute pancreatitis, unspecified complication status, unspecified pancreatitis type:    Details: IVF and pain and nausea medication and admission AKI (acute kidney injury) [facility]):    Details: IVF and admission   Amount and/or Complexity of Data Reviewed External Data Reviewed: labs, radiology and notes.    Details: Previous admission notes, labs and CT Labs: ordered.    Details: All labs: sodium slightly low 134, low potassium 2.9, elevated creatinine 1.64, normal bilirubin .8, elevated ALT 152. Negative urine. White count 14.9 elevated, elevated hemoglobin 18.3, elevated platelets 439 K  Risk Prescription drug management.    Final Clinical Impression(s) / ED Diagnoses Final diagnoses:  Acute pancreatitis, unspecified complication status, unspecified pancreatitis type  AKI (acute kidney injury) ([place])  Hypokalemia  Dehydration  Nausea vomiting and diarrhea   The patient appears reasonably stabilized for admission considering the current resources, flow, and capabilities available in the ED at this time, and I  doubt any other [facility] requiring further screening and/or treatment in the ED prior to admission.  Rx / DC Orders ED Discharge Orders     None         Pranavi Aure, MD 06/12/22 2326

## 2022-06-13 ENCOUNTER — Encounter (HOSPITAL_COMMUNITY): Payer: Self-pay | Admitting: Internal Medicine

## 2022-06-13 DIAGNOSIS — Y848 Other medical procedures as the cause of abnormal reaction of the patient, or of later complication, without mention of misadventure at the time of the procedure: Secondary | ICD-10-CM | POA: Diagnosis present

## 2022-06-13 DIAGNOSIS — D751 Secondary polycythemia: Secondary | ICD-10-CM | POA: Diagnosis present

## 2022-06-13 DIAGNOSIS — K8591 Acute pancreatitis with uninfected necrosis, unspecified: Secondary | ICD-10-CM | POA: Diagnosis present

## 2022-06-13 DIAGNOSIS — K862 Cyst of pancreas: Secondary | ICD-10-CM | POA: Diagnosis not present

## 2022-06-13 DIAGNOSIS — F319 Bipolar disorder, unspecified: Secondary | ICD-10-CM | POA: Diagnosis present

## 2022-06-13 DIAGNOSIS — K863 Pseudocyst of pancreas: Secondary | ICD-10-CM | POA: Diagnosis present

## 2022-06-13 DIAGNOSIS — K2100 Gastro-esophageal reflux disease with esophagitis, without bleeding: Secondary | ICD-10-CM | POA: Diagnosis present

## 2022-06-13 DIAGNOSIS — K208 Other esophagitis without bleeding: Secondary | ICD-10-CM | POA: Diagnosis not present

## 2022-06-13 DIAGNOSIS — A419 Sepsis, unspecified organism: Secondary | ICD-10-CM

## 2022-06-13 DIAGNOSIS — K831 Obstruction of bile duct: Secondary | ICD-10-CM | POA: Diagnosis not present

## 2022-06-13 DIAGNOSIS — K8689 Other specified diseases of pancreas: Secondary | ICD-10-CM | POA: Diagnosis not present

## 2022-06-13 DIAGNOSIS — I10 Essential (primary) hypertension: Secondary | ICD-10-CM | POA: Diagnosis present

## 2022-06-13 DIAGNOSIS — E876 Hypokalemia: Secondary | ICD-10-CM | POA: Diagnosis present

## 2022-06-13 DIAGNOSIS — N400 Enlarged prostate without lower urinary tract symptoms: Secondary | ICD-10-CM | POA: Diagnosis present

## 2022-06-13 DIAGNOSIS — K8581 Other acute pancreatitis with uninfected necrosis: Secondary | ICD-10-CM | POA: Diagnosis present

## 2022-06-13 DIAGNOSIS — N179 Acute kidney failure, unspecified: Secondary | ICD-10-CM | POA: Diagnosis present

## 2022-06-13 DIAGNOSIS — F259 Schizoaffective disorder, unspecified: Secondary | ICD-10-CM | POA: Diagnosis present

## 2022-06-13 DIAGNOSIS — Z8249 Family history of ischemic heart disease and other diseases of the circulatory system: Secondary | ICD-10-CM | POA: Diagnosis not present

## 2022-06-13 DIAGNOSIS — J449 Chronic obstructive pulmonary disease, unspecified: Secondary | ICD-10-CM | POA: Insufficient documentation

## 2022-06-13 DIAGNOSIS — E861 Hypovolemia: Secondary | ICD-10-CM | POA: Diagnosis present

## 2022-06-13 DIAGNOSIS — E860 Dehydration: Secondary | ICD-10-CM | POA: Diagnosis present

## 2022-06-13 DIAGNOSIS — K2210 Ulcer of esophagus without bleeding: Secondary | ICD-10-CM | POA: Diagnosis present

## 2022-06-13 DIAGNOSIS — K859 Acute pancreatitis without necrosis or infection, unspecified: Secondary | ICD-10-CM | POA: Diagnosis present

## 2022-06-13 DIAGNOSIS — E871 Hypo-osmolality and hyponatremia: Secondary | ICD-10-CM | POA: Diagnosis present

## 2022-06-13 DIAGNOSIS — G8929 Other chronic pain: Secondary | ICD-10-CM | POA: Diagnosis present

## 2022-06-13 DIAGNOSIS — E785 Hyperlipidemia, unspecified: Secondary | ICD-10-CM | POA: Diagnosis present

## 2022-06-13 DIAGNOSIS — R1114 Bilious vomiting: Secondary | ICD-10-CM | POA: Diagnosis not present

## 2022-06-13 DIAGNOSIS — K3189 Other diseases of stomach and duodenum: Secondary | ICD-10-CM | POA: Diagnosis not present

## 2022-06-13 DIAGNOSIS — K2289 Other specified disease of esophagus: Secondary | ICD-10-CM | POA: Diagnosis not present

## 2022-06-13 DIAGNOSIS — K8050 Calculus of bile duct without cholangitis or cholecystitis without obstruction: Secondary | ICD-10-CM | POA: Diagnosis present

## 2022-06-13 DIAGNOSIS — K861 Other chronic pancreatitis: Secondary | ICD-10-CM

## 2022-06-13 DIAGNOSIS — F17210 Nicotine dependence, cigarettes, uncomplicated: Secondary | ICD-10-CM | POA: Diagnosis present

## 2022-06-13 DIAGNOSIS — K5903 Drug induced constipation: Secondary | ICD-10-CM | POA: Diagnosis present

## 2022-06-13 DIAGNOSIS — Z833 Family history of diabetes mellitus: Secondary | ICD-10-CM | POA: Diagnosis not present

## 2022-06-13 DIAGNOSIS — R7989 Other specified abnormal findings of blood chemistry: Secondary | ICD-10-CM | POA: Diagnosis not present

## 2022-06-13 LAB — COMPREHENSIVE METABOLIC PANEL
ALT: 110 U/L — ABNORMAL HIGH (ref 0–44)
AST: 19 U/L (ref 15–41)
Albumin: 3.5 g/dL (ref 3.5–5.0)
Alkaline Phosphatase: 554 U/L — ABNORMAL HIGH (ref 38–126)
Anion gap: 11 (ref 5–15)
BUN: 34 mg/dL — ABNORMAL HIGH (ref 6–20)
CO2: 27 mmol/L (ref 22–32)
Calcium: 8.5 mg/dL — ABNORMAL LOW (ref 8.9–10.3)
Chloride: 94 mmol/L — ABNORMAL LOW (ref 98–111)
Creatinine, Ser: 1.25 mg/dL — ABNORMAL HIGH (ref 0.61–1.24)
GFR, Estimated: >60 mL/min (ref >60)
Glucose, Bld: 152 mg/dL — ABNORMAL HIGH (ref 70–99)
Potassium: 3.3 mmol/L — ABNORMAL LOW (ref 3.5–5.1)
Sodium: 132 mmol/L — ABNORMAL LOW (ref 135–145)
Total Bilirubin: 0.9 mg/dL (ref 0.3–1.2)
Total Protein: 6.9 g/dL (ref 6.5–8.1)

## 2022-06-13 LAB — CBC
HCT: 45.6 % (ref 39.0–52.0)
Hemoglobin: 15.9 g/dL (ref 13.0–17.0)
MCH: 30.9 pg (ref 26.0–34.0)
MCHC: 34.9 g/dL (ref 30.0–36.0)
MCV: 88.7 fL (ref 80.0–100.0)
Platelets: 348 10*3/uL (ref 150–400)
RBC: 5.14 MIL/uL (ref 4.22–5.81)
RDW: 12.3 % (ref 11.5–15.5)
WBC: 11.3 10*3/uL — ABNORMAL HIGH (ref 4.0–10.5)
nRBC: 0 % (ref 0.0–0.2)

## 2022-06-13 LAB — PROCALCITONIN: Procalcitonin: <0.1 ng/mL

## 2022-06-13 LAB — LACTIC ACID, PLASMA: Lactic Acid, Venous: <0.3 mmol/L — ABNORMAL LOW (ref 0.5–1.9)

## 2022-06-13 MED ORDER — ONDANSETRON HCL 4 MG/2ML IJ SOLN
4.0000 mg | Freq: Four times a day (QID) | INTRAMUSCULAR | Status: DC | PRN
  Administered 2022-06-14 – 2022-06-17 (×6): 4 mg via INTRAVENOUS
  Filled 2022-06-13 (×6): qty 2

## 2022-06-13 MED ORDER — OXYCODONE HCL 5 MG PO TABS
5.0000 mg | ORAL_TABLET | ORAL | Status: DC | PRN
  Administered 2022-06-13 – 2022-06-14 (×5): 5 mg via ORAL
  Filled 2022-06-13 (×5): qty 1

## 2022-06-13 MED ORDER — METRONIDAZOLE 500 MG/100ML IV SOLN
500.0000 mg | Freq: Two times a day (BID) | INTRAVENOUS | Status: DC
  Administered 2022-06-13 – 2022-06-15 (×6): 500 mg via INTRAVENOUS
  Filled 2022-06-13 (×6): qty 100

## 2022-06-13 MED ORDER — HYDROMORPHONE HCL 1 MG/ML IJ SOLN
0.5000 mg | INTRAMUSCULAR | Status: DC | PRN
  Administered 2022-06-13 – 2022-06-14 (×7): 0.5 mg via INTRAVENOUS
  Filled 2022-06-13: qty 1
  Filled 2022-06-13: qty 0.5
  Filled 2022-06-13: qty 1
  Filled 2022-06-13 (×2): qty 0.5
  Filled 2022-06-13: qty 1
  Filled 2022-06-13: qty 0.5

## 2022-06-13 MED ORDER — SODIUM CHLORIDE 0.9 % IV SOLN: INTRAVENOUS | Status: AC

## 2022-06-13 MED ORDER — POTASSIUM CHLORIDE 10 MEQ/100ML IV SOLN
10.0000 meq | INTRAVENOUS | Status: AC
  Administered 2022-06-13 (×2): 10 meq via INTRAVENOUS
  Filled 2022-06-13 (×2): qty 100

## 2022-06-13 MED ORDER — SODIUM CHLORIDE 0.9 % IV SOLN
2.0000 g | Freq: Three times a day (TID) | INTRAVENOUS | Status: DC
  Administered 2022-06-13 – 2022-06-15 (×8): 2 g via INTRAVENOUS
  Filled 2022-06-13 (×8): qty 12.5

## 2022-06-13 MED ORDER — NALOXONE HCL 0.4 MG/ML IJ SOLN: 0.4000 mg | INTRAMUSCULAR | Status: DC | PRN

## 2022-06-13 NOTE — H&P (Signed)
History and Physical    Steve Romero LPF:[id number] DOB: Apr 17, 1971 DOA: 06/12/2022  PCP: [name], DO  Patient coming from: Home  Chief Complaint: Abdominal pain  HPI: Steve Romero is a 51 y.o. male with medical history significant of anxiety, depression, bipolar disorder, schizoaffective disorder, smoking, COPD, hypertension, hyperlipidemia, BPH, GERD, cholecystectomy in 5329 complicated by bile leak then severe post ERCP necrotizing pancreatitis and development of pseudocyst requiring cystogastrostomy and biliary stent due to obstruction.  Recently admitted 11/12-11/19 for acute pancreatitis with pseudocyst formation and AKI.  MRCP revealed recurrent CBD stone.  Patient underwent ERCP on 11/17 revealing an edematous major papilla, slightly narrowed prior biliary sphincterotomy with fluoroscopic examination suspicious for sludge with distal narrowing noted in the duct.  A balloon sphincteroplasty was performed with sludge in the biliary tree being swept out successfully.  There was an additional irregularity found in the ventral pancreatic duct in the head of the pancreas but evaluation of this was unsuccessful.  Biopsy of pseudocyst came back benign.  GI had recommended repeating CT pancreas protocol in 3 to 4 weeks.  Patient had a hospital follow-up visit with his PCP yesterday and endorsed worsening abdominal pain, nausea, and vomiting.  Sent to the ED for further evaluation.  Patient tachycardic on arrival to the ED but afebrile.  Labs showing WBC 14.9, hemoglobin 18.3, platelet count 439k, sodium 134, potassium 2.9, magnesium 2.4, chloride 93, glucose 194, BUN 39, creatinine 1.6 (baseline 0.7-0.9), AST 27, ALT 152, alk phos 703, T. bili 0.8, lipase 396, UA without signs of infection.  CT abdomen pelvis with contrast showing: "IMPRESSION: 1. Interval enlargement of a dominant pancreatic pseudocyst within the mid body of the pancreas, now measuring 8.9 cm in greatest dimension.  This demonstrates mass effect upon the distal body of the stomach anteriorly and portosplenic confluence which is nearly obliterated posteriorly. This may result from necrosis of the mid body of the pancreas, separation of the viable pancreatic tail, and communication of the collection with the main duct of the a tail of the pancreas. Gastroenterology consultation for endoscopic cyst-gastrostomy be helpful for further management. 2. Superimposed peripancreatic inflammatory changes, possibly representing residual or recurrent acute pancreatitis. Secondary inflammatory changes involving the second and third portion of the duodenum without associated obstruction. 3. Progressive intra and moderate extrahepatic biliary ductal dilation with relatively abrupt narrowing of the distal extrahepatic duct. This may relate to inflammatory change related to recent endoscopic procedure, acute inflammatory process involving the pancreas, or mass effect related to the enlarging pancreatic pseudocyst. 4. Post inflammatory adhesions with tethering of the mid transverse colon again identified. No evidence of obstruction."  Patient was given droperidol, fentanyl, IV magnesium and potassium, and started on IV fluids.  Dr. Candis Schatz with [facility] consulted and will see the patient in the morning.  [facility] called to admit.  Patient is reporting 1 month history of progressively worsening epigastric abdominal pain.  He is not eating much due to the pain and losing weight.  Since yesterday he has been vomiting and has not been able to tolerate p.o. intake.  He had an episode of diarrhea yesterday morning.  Denies fevers or chills.  No other complaints.  Denies chest pain or shortness of breath.  Review of Systems:  Review of Systems  All other systems reviewed and are negative.   Past Medical History:  Diagnosis Date   Acute pancreatitis without necrosis or infection, unspecified    AKI (acute kidney injury)  ([place]) 05/17/2022   Anxiety 10/2014  Ascites    Bipolar disorder [facility]) age 73   COPD (chronic obstructive pulmonary disease) ([place]) 2016   Depression age 51   Enlarged prostate    Hyperlipidemia 2016   Hypertension 2013   Schizoaffective disorder ([place]) 11/13/2014   Sleep apnea    Does not wear c-pap, sleeps in sitting up position per pt   Thyroid disease 11/2014    Past Surgical History:  Procedure Laterality Date   BALLOON DILATION N/A 11/07/2020   Procedure: BALLOON DILATION;  Surgeon: Irving Copas., MD;  Location: [place];  Service: Gastroenterology;  Laterality: N/A;   BALLOON DILATION N/A 06/26/2021   Procedure: BALLOON DILATION;  Surgeon: Rush Landmark Telford Nab., MD;  Location: [place];  Service: Gastroenterology;  Laterality: N/A;   BILIARY DILATION  07/24/2021   Procedure: BILIARY DILATION;  Surgeon: Rush Landmark Telford Nab., MD;  Location: [place];  Service: Gastroenterology;;   BILIARY DILATION  05/22/2022   Procedure: BILIARY DILATION;  Surgeon: Irving Copas., MD;  Location[facility];  Service: Gastroenterology;;   BILIARY STENT PLACEMENT N/A 10/10/2020   Procedure: BILIARY STENT PLACEMENT;  Surgeon: Rogene Houston, MD;  [facility];  Service: Endoscopy;  Laterality: N/A;   BILIARY STENT PLACEMENT  11/09/2020   Procedure: BILIARY STENT PLACEMENT;  Surgeon: Rush Landmark Telford Nab., MD;  Location: [place];  Service: Gastroenterology;;   BILIARY STENT PLACEMENT  11/07/2020   Procedure: BILIARY STENT PLACEMENT;  Surgeon: Irving Copas., MD;  Location: [place];  Service: Gastroenterology;;   BILIARY STENT PLACEMENT  11/11/2020   Procedure: BILIARY STENT PLACEMENT;  Surgeon: Irving Copas., MD;  Location: [place];  Service: Gastroenterology;;   BILIARY STENT PLACEMENT  11/14/2020   Procedure: BILIARY STENT PLACEMENT;  Surgeon: Irving Copas., MD;  Location: [place];  Service:  Gastroenterology;;   BILIARY STENT PLACEMENT N/A 01/29/2021   Procedure: BILIARY STENT PLACEMENT;  Surgeon: Irving Copas., MD;  Location: [facility];  Service: Gastroenterology;  Laterality: N/A;   BILIARY STENT PLACEMENT  06/26/2021   Procedure: BILIARY STENT PLACEMENT;  Surgeon: Rush Landmark Telford Nab., MD;  Location: [place];  Service: Gastroenterology;;   BILIARY STENT PLACEMENT  07/24/2021   Procedure: BILIARY STENT PLACEMENT;  Surgeon: Irving Copas., MD;  Location: [place];  Service: Gastroenterology;;   BIOPSY  11/07/2020   Procedure: BIOPSY;  Surgeon: Irving Copas., MD;  Location: [place];  Service: Gastroenterology;;   BIOPSY  12/20/2020   Procedure: BIOPSY;  Surgeon: Irving Copas., MD;  Location: [facility];  Service: Gastroenterology;;   BIOPSY  06/26/2021   Procedure: BIOPSY;  Surgeon: Irving Copas., MD;  Location: [place];  Service: Gastroenterology;;   CARPAL TUNNEL RELEASE Left 02/21/2016   Procedure: CARPAL TUNNEL RELEASE;  Surgeon: Carole Civil, MD;  [facility]: [facility];  Service: Orthopedics;  Laterality: Left;   CHOLECYSTECTOMY N/A 10/09/2020   Procedure: LAPAROSCOPIC CHOLECYSTECTOMY;  Surgeon: Virl Cagey, MD;  Location: [facility];  Service: General;  Laterality: N/A;   CYST ENTEROSTOMY N/A 11/07/2020   Procedure: CYST ENTEROSTOMY;  Surgeon: Irving Copas., MD;  Location: [place];  Service: Gastroenterology;  Laterality: N/A;   CYST GASTROSTOMY  11/11/2020   Procedure: CYST NECROSECTOMY;  Surgeon: Rush Landmark Telford Nab., MD;  Location: [place];  Service: Gastroenterology;;   CYST GASTROSTOMY  11/14/2020   Procedure: CYST NECROSECTOMY;  Surgeon: Irving Copas., MD;  Location: [place];  Service: Gastroenterology;;   CYST REMOVAL HAND     CYSTOSCOPY  06/26/2021  Procedure: CYSTOGASTROSTOMY;  Surgeon: Mansouraty, Telford Nab., MD;  Location: [facility];   Service: Gastroenterology;;   ENDOSCOPIC RETROGRADE CHOLANGIOPANCREATOGRAPHY (ERCP) WITH PROPOFOL N/A 11/09/2020   Procedure: ENDOSCOPIC RETROGRADE CHOLANGIOPANCREATOGRAPHY (ERCP) WITH PROPOFOL;  Surgeon: Irving Copas., MD;  Location: [place];  Service: Gastroenterology;  Laterality: N/A;   ENDOSCOPIC RETROGRADE CHOLANGIOPANCREATOGRAPHY (ERCP) WITH PROPOFOL N/A 12/20/2020   Procedure: ENDOSCOPIC RETROGRADE CHOLANGIOPANCREATOGRAPHY (ERCP) WITH PROPOFOL;  Surgeon: Rush Landmark Telford Nab., MD;  Location: [facility];  Service: Gastroenterology;  Laterality: N/A;   ENDOSCOPIC RETROGRADE CHOLANGIOPANCREATOGRAPHY (ERCP) WITH PROPOFOL N/A 01/29/2021   Procedure: ENDOSCOPIC RETROGRADE CHOLANGIOPANCREATOGRAPHY (ERCP) WITH PROPOFOL;  Surgeon: Rush Landmark Telford Nab., MD;  Location: [facility];  Service: Gastroenterology;  Laterality: N/A;   ENDOSCOPIC RETROGRADE CHOLANGIOPANCREATOGRAPHY (ERCP) WITH PROPOFOL N/A 07/24/2021   Procedure: ENDOSCOPIC RETROGRADE CHOLANGIOPANCREATOGRAPHY (ERCP) WITH PROPOFOL;  Surgeon: Rush Landmark Telford Nab., MD;  Location: [place];  Service: Gastroenterology;  Laterality: N/A;   ERCP N/A 10/10/2020   Procedure: ENDOSCOPIC RETROGRADE CHOLANGIOPANCREATOGRAPHY (ERCP);  Surgeon: Rogene Houston, MD;  [facility];  Service: Endoscopy;  Laterality: N/A;   ERCP     ERCP N/A 05/22/2022   Procedure: ENDOSCOPIC RETROGRADE CHOLANGIOPANCREATOGRAPHY (ERCP);  Surgeon: Irving Copas., MD;  Location[facility];  Service: Gastroenterology;  Laterality: N/A;   ESOPHAGOGASTRODUODENOSCOPY N/A 11/11/2020   Procedure: ESOPHAGOGASTRODUODENOSCOPY (EGD);  Surgeon: Irving Copas., MD;  Location: [place];  Service: Gastroenterology;  Laterality: N/A;   ESOPHAGOGASTRODUODENOSCOPY N/A 11/14/2020   Procedure: ESOPHAGOGASTRODUODENOSCOPY (EGD);  Surgeon: Irving Copas., MD;  Location: [place];  Service: Gastroenterology;  Laterality: N/A;    ESOPHAGOGASTRODUODENOSCOPY (EGD) WITH PROPOFOL N/A 11/09/2020   Procedure: ESOPHAGOGASTRODUODENOSCOPY (EGD) WITH PROPOFOL;  Surgeon: Rush Landmark Telford Nab., MD;  Location: [place];  Service: Gastroenterology;  Laterality: N/A;   ESOPHAGOGASTRODUODENOSCOPY (EGD) WITH PROPOFOL N/A 11/07/2020   Procedure: ESOPHAGOGASTRODUODENOSCOPY (EGD) WITH PROPOFOL;  Surgeon: Rush Landmark Telford Nab., MD;  Location: [place];  Service: Gastroenterology;  Laterality: N/A;   ESOPHAGOGASTRODUODENOSCOPY (EGD) WITH PROPOFOL N/A 12/20/2020   Procedure: ESOPHAGOGASTRODUODENOSCOPY (EGD) WITH PROPOFOL;  Surgeon: Rush Landmark Telford Nab., MD;  [facility];  Service: Gastroenterology;  Laterality: N/A;   ESOPHAGOGASTRODUODENOSCOPY (EGD) WITH PROPOFOL N/A 06/26/2021   Procedure: ESOPHAGOGASTRODUODENOSCOPY (EGD) WITH PROPOFOL;  Surgeon: Rush Landmark Telford Nab., MD;  Location: [place];  Service: Gastroenterology;  Laterality: N/A;   ESOPHAGOGASTRODUODENOSCOPY (EGD) WITH PROPOFOL N/A 07/24/2021   Procedure: ESOPHAGOGASTRODUODENOSCOPY (EGD) WITH PROPOFOL;  Surgeon: Rush Landmark Telford Nab., MD;  Location: [place];  Service: Gastroenterology;  Laterality: N/A;  AXIOS STENT   ESOPHAGOGASTRODUODENOSCOPY (EGD) WITH PROPOFOL N/A 05/22/2022   Procedure: ESOPHAGOGASTRODUODENOSCOPY (EGD) WITH PROPOFOL;  Surgeon: Rush Landmark Telford Nab., MD;  [facility];  Service: Gastroenterology;  Laterality: N/A;   EUS  11/14/2020   Procedure: UPPER ENDOSCOPIC ULTRASOUND (EUS) LINEAR;  Surgeon: Irving Copas., MD;  Location: [place];  Service: Gastroenterology;;   EUS N/A 06/26/2021   Procedure: UPPER ENDOSCOPIC ULTRASOUND (EUS) RADIAL;  Surgeon: Irving Copas., MD;  Location: [place];  Service: Gastroenterology;  Laterality: N/A;   EUS N/A 05/22/2022   Procedure: UPPER ENDOSCOPIC ULTRASOUND (EUS) LINEAR;  Surgeon: Irving Copas., MD;  [facility];  Service:  Gastroenterology;  Laterality: N/A;   FINE NEEDLE ASPIRATION  05/22/2022   Procedure: FINE NEEDLE ASPIRATION;  Surgeon: Rush Landmark Telford Nab., MD;  [facility];  Service: Gastroenterology;;   GASTROINTESTINAL STENT REMOVAL  12/20/2020   Procedure: GASTROINTESTINAL STENT REMOVAL;  Surgeon: Irving Copas., MD;  Location[facility];  Service: Gastroenterology;;  cyst gastrostomy stent  and double pig tail stents x2 removed   HERNIA REPAIR Left 2002   groin   INGUINAL HERNIA REPAIR Left 04/05/2017   Procedure: RECURRENT HERNIA REPAIR INGUINAL ADULT WITH MESH;  Surgeon: [name], MD;  Location: [facility];  Service: General;  Laterality: Left;   MASS EXCISION Right 04/29/2020   Procedure: EXCISION MASS RIGHT WRIST;  Surgeon: [name], MD;  Location: [place];  Service: Orthopedics;  Laterality: Right;   MASS EXCISION Right 10/09/2020   Procedure: EXCISION MASS, ABDOMINAL WALL, 2CM;  Surgeon: Virl Cagey, MD;  Location: [facility];  Service: General;  Laterality: Right;   PANCREATIC STENT PLACEMENT  06/26/2021   Procedure: AXIOS STENT PLACEMENT;  Surgeon: Irving Copas., MD;  Location: [place];  Service: Gastroenterology;;   REMOVAL OF STONES  12/20/2020   Procedure: REMOVAL OF STONES;  Surgeon: Irving Copas., MD;  Location: [facility];  Service: Gastroenterology;;   REMOVAL OF STONES  07/24/2021   Procedure: REMOVAL OF STONES;  Surgeon: Irving Copas., MD;  Location: [place];  Service: Gastroenterology;;   REMOVAL OF STONES  05/22/2022   Procedure: REMOVAL OF STONES;  Surgeon: Irving Copas., MD;  Location: [facility];  Service: Gastroenterology;;   Joan Mayans N/A 10/10/2020   Procedure: Joan Mayans;  Surgeon: Rogene Houston, MD;  Location: [facility];  Service: Endoscopy;  Laterality: N/A;   SPHINCTEROTOMY  07/24/2021   Procedure: SPHINCTEROTOMY;  Surgeon: Mansouraty, Telford Nab., MD;  Location:  [place];  Service: Gastroenterology;;   Bess Kinds CHOLANGIOSCOPY N/A 12/20/2020   Procedure: VVOHYWVP CHOLANGIOSCOPY;  Surgeon: Irving Copas., MD;  [facility];  Service: Gastroenterology;  Laterality: N/A;   SPYGLASS CHOLANGIOSCOPY N/A 07/24/2021   Procedure: SPYGLASS CHOLANGIOSCOPY;  Surgeon: Irving Copas., MD;  Location: [place];  Service: Gastroenterology;  Laterality: N/A;   STENT REMOVAL  11/09/2020   Procedure: STENT REMOVAL;  Surgeon: Irving Copas., MD;  Location: [place];  Service: Gastroenterology;;   Lavell Islam REMOVAL  11/11/2020   Procedure: STENT REMOVAL;  Surgeon: Irving Copas., MD;  Location: [place];  Service: Gastroenterology;;   Lavell Islam REMOVAL  11/14/2020   Procedure: STENT REMOVAL;  Surgeon: Irving Copas., MD;  Location: [place];  Service: Gastroenterology;;   Lavell Islam REMOVAL  12/20/2020   Procedure: STENT REMOVAL;  Surgeon: Irving Copas., MD;  Location[facility];  Service: Gastroenterology;;  biliary x2   STENT REMOVAL  07/24/2021   Procedure: AXIOS STENT REMOVAL;  Surgeon: Irving Copas., MD;  Location: [place];  Service: Gastroenterology;;   UPPER ESOPHAGEAL ENDOSCOPIC ULTRASOUND (EUS) N/A 11/07/2020   Procedure: UPPER ESOPHAGEAL ENDOSCOPIC ULTRASOUND (EUS);  Surgeon: Irving Copas., MD;  Location: [place];  Service: Gastroenterology;  Laterality: N/A;   UPPER GASTROINTESTINAL ENDOSCOPY     WOUND DEBRIDEMENT  11/09/2020   Procedure: CYST NECROSECTOMY;  Surgeon: Rush Landmark Telford Nab., MD;  Location: [place];  Service: Gastroenterology;;     reports that he has been smoking cigarettes. He has a 6.50 pack-year smoking history. He has never used smokeless tobacco. He reports that he does not drink alcohol and does not use drugs.  Allergies  Allergen Reactions   Desipramine Nausea Only and Rash    Family History  Problem Relation Age of Onset    Diabetes Father    Heart disease Father    Heart disease Maternal Grandmother    Stroke Maternal Grandfather    Colon cancer Neg Hx    Esophageal cancer Neg Hx  Inflammatory bowel disease Neg Hx    Liver disease Neg Hx    Pancreatic cancer Neg Hx    Rectal cancer Neg Hx    Stomach cancer Neg Hx     Prior to Admission medications   Medication Sig Start Date End Date Taking? Authorizing Provider  lipase/protease/amylase (CREON) 36000 UNITS CPEP capsule Take 2 capsules (72,000 Units total) by mouth 3 (three) times daily with meals. 05/24/22   Shalhoub, Sherryll Burger, MD  ondansetron (ZOFRAN-ODT) 4 MG disintegrating tablet Take 1 tablet (4 mg total) by mouth every 8 (eight) hours as needed for nausea or vomiting. 06/11/22   Dettinger, Fransisca Kaufmann, MD  pantoprazole (PROTONIX) 40 MG tablet Take 1 tablet (40 mg total) by mouth daily. 06/11/22   Dettinger, Fransisca Kaufmann, MD    Physical Exam: Vitals:   06/12/22 1838 06/12/22 2315 06/13/22 0000  BP: (!) 130/107 (!) 160/117 (!) 154/104  Pulse: (!) 117 95 99  Resp: 16 (!) 22 18  Temp: 98.4 F (36.9 C)    TempSrc: Oral    SpO2: 97% 98% 93%    Physical Exam Vitals reviewed.  Constitutional:      General: He is not in acute distress. HENT:     Head: Normocephalic and atraumatic.  Eyes:     Extraocular Movements: Extraocular movements intact.  Cardiovascular:     Rate and Rhythm: Normal rate and regular rhythm.     Pulses: Normal pulses.  Pulmonary:     Effort: Pulmonary effort is normal. No respiratory distress.     Breath sounds: Normal breath sounds. No wheezing or rales.  Abdominal:     General: Bowel sounds are normal. There is no distension.     Palpations: Abdomen is soft.     Tenderness: There is abdominal tenderness. There is no guarding or rebound.     Comments: Epigastrium tender to palpation  Musculoskeletal:     Cervical back: Normal range of motion.     Right lower leg: No edema.     Left lower leg: No edema.  Skin:     General: Skin is warm and dry.  Neurological:     General: No focal deficit present.     Mental Status: He is alert and oriented to person, place, and time.     Labs on Admission: I have personally reviewed following labs and imaging studies  CBC: Recent Labs  Lab 06/11/22 1212 06/12/22 1942  WBC 12.2* 14.9*  NEUTROABS 9.7* 11.1*  HGB 19.9* 18.3*  HCT 58.2* 51.9  MCV 91 87.5  PLT 426 517*   Basic Metabolic Panel: Recent Labs  Lab 06/11/22 1212 06/12/22 1942  NA 140 134*  K 4.0 2.9*  CL 94* 93*  CO2 24 26  GLUCOSE 187* 194*  BUN 25* 39*  CREATININE 1.07 1.64*  CALCIUM 10.1 9.5  MG  --  2.4   GFR: Estimated Creatinine Clearance: 60 mL/min (A) (by C-G formula based on SCr of 1.64 mg/dL (H)). Liver Function Tests: Recent Labs  Lab 06/11/22 1212 06/12/22 1942  AST 43* 27  ALT 266* 152*  ALKPHOS 1,182* 703*  BILITOT 0.6 0.8  PROT 8.1 8.2*  ALBUMIN 4.8 4.1   Recent Labs  Lab 06/11/22 1212 06/12/22 1942  LIPASE 493* 396*   No results for input(s): "AMMONIA" in the last 168 hours. Coagulation Profile: No results for input(s): "INR", "PROTIME" in the last 168 hours. Cardiac Enzymes: No results for input(s): "CKTOTAL", "CKMB", "CKMBINDEX", "TROPONINI" in the last 168 hours.  BNP (last 3 results) No results for input(s): "PROBNP" in the last 8760 hours. HbA1C: No results for input(s): "HGBA1C" in the last 72 hours. CBG: No results for input(s): "GLUCAP" in the last 168 hours. Lipid Profile: No results for input(s): "CHOL", "HDL", "LDLCALC", "TRIG", "CHOLHDL", "LDLDIRECT" in the last 72 hours. Thyroid Function Tests: No results for input(s): "TSH", "T4TOTAL", "FREET4", "T3FREE", "THYROIDAB" in the last 72 hours. Anemia Panel: No results for input(s): "VITAMINB12", "FOLATE", "FERRITIN", "TIBC", "IRON", "RETICCTPCT" in the last 72 hours. Urine analysis:    Component Value Date/Time   COLORURINE AMBER (A) 06/12/2022 2259   APPEARANCEUR HAZY (A) 06/12/2022  2259   LABSPEC 1.028 06/12/2022 2259   PHURINE 5.0 06/12/2022 2259   GLUCOSEU NEGATIVE 06/12/2022 2259   HGBUR NEGATIVE 06/12/2022 2259   BILIRUBINUR NEGATIVE 06/12/2022 2259   KETONESUR NEGATIVE 06/12/2022 2259   PROTEINUR 30 (A) 06/12/2022 2259   UROBILINOGEN 0.2 10/24/2014 1136   NITRITE NEGATIVE 06/12/2022 2259   LEUKOCYTESUR NEGATIVE 06/12/2022 2259    Radiological Exams on Admission: No results found.  EKG: Independently reviewed.  Sinus rhythm, no significant change since prior tracing.  Assessment and Plan  SIRS/possible sepsis secondary to complicated recurrent acute pancreatitis with pseudocyst -Patient with history of cholecystectomy in 1610 complicated by bile leak then severe post ERCP necrotizing pancreatitis and development of pseudocyst requiring cystogastrostomy and biliary stent due to obstruction.  Recently admitted 11/12-11/19 for acute pancreatitis with pseudocyst formation.  MRCP revealed recurrent CBD stone.  Patient underwent ERCP on 11/17 revealing an edematous major papilla, slightly narrowed prior biliary sphincterotomy with fluoroscopic examination suspicious for sludge with distal narrowing noted in the duct.  A balloon sphincteroplasty was performed with sludge in the biliary tree being swept out successfully.  There was an additional irregularity found in the ventral pancreatic duct in the head of the pancreas but evaluation of this was unsuccessful.  Biopsy of pseudocyst came back benign.   -Patient returns to the ED today due to worsening abdominal pain, nausea, and vomiting. -CT abdomen pelvis with contrast showing: "IMPRESSION: 1. Interval enlargement of a dominant pancreatic pseudocyst within the mid body of the pancreas, now measuring 8.9 cm in greatest dimension. This demonstrates mass effect upon the distal body of the stomach anteriorly and portosplenic confluence which is nearly obliterated posteriorly. This may result from necrosis of the  mid body of the pancreas, separation of the viable pancreatic tail, and communication of the collection with the main duct of the a tail of the pancreas. Gastroenterology consultation for endoscopic cyst-gastrostomy be helpful for further management. 2. Superimposed peripancreatic inflammatory changes, possibly representing residual or recurrent acute pancreatitis. Secondary inflammatory changes involving the second and third portion of the duodenum without associated obstruction. 3. Progressive intra and moderate extrahepatic biliary ductal dilation with relatively abrupt narrowing of the distal extrahepatic duct. This may relate to inflammatory change related to recent endoscopic procedure, acute inflammatory process involving the pancreas, or mass effect related to the enlarging pancreatic pseudocyst. 4. Post inflammatory adhesions with tethering of the mid transverse colon again identified. No evidence of obstruction." -Patient met SIRS criteria with tachycardia at the time of presentation and WBC count 14.9.  Tachycardia has now improved with fluids and pain management.  Patient is afebrile and not hypotensive. -AST 27, ALT 152, alk phos 703, T. bili 0.8, lipase 396 -[facility] consulted and will see the patient in the morning -Keep n.p.o. -Continue normal saline at 150 cc/h -Blood cultures ordered and will start antibiotics (cefepime and  Flagyl) -Continue pain management -Check lactate and procalcitonin level -Repeat CBC and CMP in a.m.  AKI Likely prerenal from dehydration. BUN 39, creatinine 1.6 (baseline 0.7-0.9).  No evidence of obstructive uropathy on CT. -IV fluid hydration -Monitor renal function -Avoid nephrotoxic agents  Hypokalemia -Cardiac monitoring -Continue to replace potassium and monitor closely -Magnesium level is normal  Erythrocytosis Possibly due to dehydration/hemoconcentration as platelet count is also slightly up from baseline. -Repeat CBC in  a.m.  COPD Documented in the chart and history of smoking but patient is not on inhalers at home.  No wheezing or respiratory distress. -Continue to monitor  Hypertension Not on any medications.  Currently normotensive. -Continue to monitor  DVT prophylaxis: SCDs Code Status: Full Code (discussed with the patient) Family Communication: No family available at this time. Consults called:  GI Level of care: Progressive Care Unit Admission status: It is my clinical opinion that admission to INPATIENT is reasonable and necessary because of the expectation that this patient will require hospital care that crosses at least 2 midnights to treat this condition based on the medical complexity of the problems presented.  Given the aforementioned information, the predictability of an adverse outcome is felt to be significant.    Shela Leff MD [facility]  If 7PM-7AM, please contact night-coverage [email or web address]  06/13/2022, 12:31 AM

## 2022-06-13 NOTE — Hospital Course (Signed)
Steve Romero is a 51 y.o. male with medical history significant of anxiety, depression, bipolar disorder, schizoaffective disorder, smoking, COPD, hypertension, hyperlipidemia, BPH, GERD, cholecystectomy in [id number] complicated by bile leak then severe post ERCP necrotizing pancreatitis and development of pseudocyst requiring cystogastrostomy and biliary stent due to obstruction.  Recently admitted 11/12-11/19 for acute pancreatitis with pseudocyst formation and AKI.  MRCP revealed recurrent CBD stone.  Patient underwent ERCP on 11/17 revealing an edematous major papilla, slightly narrowed prior biliary sphincterotomy with fluoroscopic examination suspicious for sludge with distal narrowing noted in the duct.  A balloon sphincteroplasty was performed with sludge in the biliary tree being swept out successfully.  There was an additional irregularity found in the ventral pancreatic duct in the head of the pancreas but evaluation of this was unsuccessful. Biopsy of pseudocyst came back benign.  GI had recommended repeating CT pancreas protocol in 3 to 4 weeks.   Patient had a hospital follow-up visit with his PCP yesterday and endorsed worsening abdominal pain, nausea, and vomiting.  Sent to the ED for further evaluation, started on IVF and abx with GI consult.

## 2022-06-13 NOTE — Consult Note (Signed)
Consultation  Referring Provider:     Dr. Randal Buba Primary Care Physician:  [name], DO Primary Gastroenterologist:        Dr. Rush Landmark Reason for Consultation:     Recurrent pancreatitis         HPI:   Steve Romero is a 51 y.o. 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 Admitted 11/12-11/19 for acute pancreatitis with pseudocyst formation and AKI.  MRCP revealed recurrent CBD stone.  Patient underwent ERCP on 11/17 revealing an edematous major papilla, slightly narrowed prior biliary sphincterotomy with fluoroscopic examination suspicious for sludge with distal narrowing noted in the duct.  A balloon sphincteroplasty was performed with sludge in the biliary tree being swept out successfully.  There was an additional irregularity found in the ventral pancreatic duct in the head of the pancreas but evaluation of this was unsuccessful.  Aspiration of pseudocyst yielded benign fluid.   He had been doing well following his recent discharge, but in the past week he developed worsening abdominal pain, nausea and vomiting and p.o. intolerance.   No fevers or chills.  He has been having loose stools, no blood.  He denies any yellowing of the eyes or skin.  He has been losing weight, he thinks at least 10 pounds in the last month.  He presented to the emergency department last night and CT scan was performed which showed the following: IMPRESSION: 1. Interval enlargement of a dominant pancreatic pseudocyst within the mid body of the pancreas, now measuring 8.9 cm in greatest dimension. This demonstrates mass effect upon the distal body of the stomach anteriorly and portosplenic confluence  which is nearly obliterated posteriorly. This may result from necrosis of the mid body of the pancreas, separation of the viable pancreatic tail, and communication of the collection with the main duct of the a tail of the pancreas. Gastroenterology consultation for endoscopic cyst-gastrostomy be helpful for further management. 2. Superimposed peripancreatic inflammatory changes, possibly representing residual or recurrent acute pancreatitis. Secondary inflammatory changes involving the second and third portion of the duodenum without associated obstruction. 3. Progressive intra and moderate extrahepatic biliary ductal dilation with relatively abrupt narrowing of the distal extrahepatic duct. This may relate to inflammatory change related to recent endoscopic procedure, acute inflammatory process involving the pancreas, or mass effect related to the enlarging pancreatic pseudocyst. 4. Post inflammatory adhesions with tethering of the mid transverse colon again identified. No evidence of obstruction.  His labs were significant for a mild leukocytosis and elevated hemoglobin suggestive of hemoconcentration.  Liver panel notable for marked elevation of his alkaline phosphatase on admission at 1182, up from 133 weeks ago.  This is improved with IV fluid hydration, down to 554 today.  His bilirubin has remained normal. Blood cultures negative thus far with normal procalcitonin.  Past Medical History:  Diagnosis Date   Acute pancreatitis without necrosis or infection, unspecified    AKI (acute kidney injury) ([place]) 05/17/2022   Anxiety 10/2014   Ascites    Bipolar disorder [facility]) age 35   COPD (chronic obstructive pulmonary disease) ([place]) 2016   Depression age 36   Enlarged prostate    Hyperlipidemia 2016   Hypertension 2013   Schizoaffective disorder ([place]) 11/13/2014   Sleep apnea    Does not wear c-pap, sleeps in sitting  up position per pt   Thyroid disease 11/2014    Past Surgical  History:  Procedure Laterality Date   BALLOON DILATION N/A 11/07/2020   Procedure: BALLOON DILATION;  Surgeon: Rush Landmark Telford Nab., MD;  Location: [place];  Service: Gastroenterology;  Laterality: N/A;   BALLOON DILATION N/A 06/26/2021   Procedure: BALLOON DILATION;  Surgeon: Rush Landmark Telford Nab., MD;  Location: [place];  Service: Gastroenterology;  Laterality: N/A;   BILIARY DILATION  07/24/2021   Procedure: BILIARY DILATION;  Surgeon: Rush Landmark Telford Nab., MD;  Location: [place];  Service: Gastroenterology;;   BILIARY DILATION  05/22/2022   Procedure: BILIARY DILATION;  Surgeon: Irving Copas., MD;  Location[facility];  Service: Gastroenterology;;   BILIARY STENT PLACEMENT N/A 10/10/2020   Procedure: BILIARY STENT PLACEMENT;  Surgeon: Rogene Houston, MD;  [facility];  Service: Endoscopy;  Laterality: N/A;   BILIARY STENT PLACEMENT  11/09/2020   Procedure: BILIARY STENT PLACEMENT;  Surgeon: Rush Landmark Telford Nab., MD;  Location: [place];  Service: Gastroenterology;;   BILIARY STENT PLACEMENT  11/07/2020   Procedure: BILIARY STENT PLACEMENT;  Surgeon: Irving Copas., MD;  Location: [place];  Service: Gastroenterology;;   BILIARY STENT PLACEMENT  11/11/2020   Procedure: BILIARY STENT PLACEMENT;  Surgeon: Irving Copas., MD;  Location: [place];  Service: Gastroenterology;;   BILIARY STENT PLACEMENT  11/14/2020   Procedure: BILIARY STENT PLACEMENT;  Surgeon: Irving Copas., MD;  Location: [place];  Service: Gastroenterology;;   BILIARY STENT PLACEMENT N/A 01/29/2021   Procedure: BILIARY STENT PLACEMENT;  Surgeon: Irving Copas., MD;  Location[facility];  Service: Gastroenterology;  Laterality: N/A;   BILIARY STENT PLACEMENT  06/26/2021   Procedure: BILIARY STENT PLACEMENT;  Surgeon: Rush Landmark Telford Nab., MD;  Location: [place];  Service: Gastroenterology;;   BILIARY STENT PLACEMENT   07/24/2021   Procedure: BILIARY STENT PLACEMENT;  Surgeon: Irving Copas., MD;  Location: [place];  Service: Gastroenterology;;   BIOPSY  11/07/2020   Procedure: BIOPSY;  Surgeon: Irving Copas., MD;  Location: [place];  Service: Gastroenterology;;   BIOPSY  12/20/2020   Procedure: BIOPSY;  Surgeon: Irving Copas., MD;  Location: [facility];  Service: Gastroenterology;;   BIOPSY  06/26/2021   Procedure: BIOPSY;  Surgeon: Irving Copas., MD;  Location: [place];  Service: Gastroenterology;;   CARPAL TUNNEL RELEASE Left 02/21/2016   Procedure: CARPAL TUNNEL RELEASE;  Surgeon: Carole Civil, MD;  [facility]: [facility];  Service: Orthopedics;  Laterality: Left;   CHOLECYSTECTOMY N/A 10/09/2020   Procedure: LAPAROSCOPIC CHOLECYSTECTOMY;  Surgeon: Virl Cagey, MD;  Location: [facility];  Service: General;  Laterality: N/A;   CYST ENTEROSTOMY N/A 11/07/2020   Procedure: CYST ENTEROSTOMY;  Surgeon: Irving Copas., MD;  Location: [place];  Service: Gastroenterology;  Laterality: N/A;   CYST GASTROSTOMY  11/11/2020   Procedure: CYST NECROSECTOMY;  Surgeon: Rush Landmark Telford Nab., MD;  Location: [place];  Service: Gastroenterology;;   CYST GASTROSTOMY  11/14/2020   Procedure: CYST NECROSECTOMY;  Surgeon: Irving Copas., MD;  Location: [place];  Service: Gastroenterology;;   CYST REMOVAL HAND     CYSTOSCOPY  06/26/2021   Procedure: CYSTOGASTROSTOMY;  Surgeon: Mansouraty, Telford Nab., MD;  Location: [facility];  Service: Gastroenterology;;   ENDOSCOPIC RETROGRADE CHOLANGIOPANCREATOGRAPHY (ERCP) WITH PROPOFOL N/A 11/09/2020   Procedure: ENDOSCOPIC RETROGRADE CHOLANGIOPANCREATOGRAPHY (ERCP) WITH PROPOFOL;  Surgeon: Irving Copas., MD;  Location: [place];  Service: Gastroenterology;  Laterality: N/A;   ENDOSCOPIC  RETROGRADE CHOLANGIOPANCREATOGRAPHY (ERCP) WITH PROPOFOL N/A 12/20/2020   Procedure:  ENDOSCOPIC RETROGRADE CHOLANGIOPANCREATOGRAPHY (ERCP) WITH PROPOFOL;  Surgeon: Rush Landmark Telford Nab., MD;  [facility];  Service: Gastroenterology;  Laterality: N/A;   ENDOSCOPIC RETROGRADE CHOLANGIOPANCREATOGRAPHY (ERCP) WITH PROPOFOL N/A 01/29/2021   Procedure: ENDOSCOPIC RETROGRADE CHOLANGIOPANCREATOGRAPHY (ERCP) WITH PROPOFOL;  Surgeon: Rush Landmark Telford Nab., MD;  [facility];  Service: Gastroenterology;  Laterality: N/A;   ENDOSCOPIC RETROGRADE CHOLANGIOPANCREATOGRAPHY (ERCP) WITH PROPOFOL N/A 07/24/2021   Procedure: ENDOSCOPIC RETROGRADE CHOLANGIOPANCREATOGRAPHY (ERCP) WITH PROPOFOL;  Surgeon: Rush Landmark Telford Nab., MD;  Location: [place];  Service: Gastroenterology;  Laterality: N/A;   ERCP N/A 10/10/2020   Procedure: ENDOSCOPIC RETROGRADE CHOLANGIOPANCREATOGRAPHY (ERCP);  Surgeon: Rogene Houston, MD;  [facility];  Service: Endoscopy;  Laterality: N/A;   ERCP     ERCP N/A 05/22/2022   Procedure: ENDOSCOPIC RETROGRADE CHOLANGIOPANCREATOGRAPHY (ERCP);  Surgeon: Irving Copas., MD;  Location[facility];  Service: Gastroenterology;  Laterality: N/A;   ESOPHAGOGASTRODUODENOSCOPY N/A 11/11/2020   Procedure: ESOPHAGOGASTRODUODENOSCOPY (EGD);  Surgeon: Irving Copas., MD;  Location: [place];  Service: Gastroenterology;  Laterality: N/A;   ESOPHAGOGASTRODUODENOSCOPY N/A 11/14/2020   Procedure: ESOPHAGOGASTRODUODENOSCOPY (EGD);  Surgeon: Irving Copas., MD;  Location: [place];  Service: Gastroenterology;  Laterality: N/A;   ESOPHAGOGASTRODUODENOSCOPY (EGD) WITH PROPOFOL N/A 11/09/2020   Procedure: ESOPHAGOGASTRODUODENOSCOPY (EGD) WITH PROPOFOL;  Surgeon: Rush Landmark Telford Nab., MD;  Location: [place];  Service: Gastroenterology;  Laterality: N/A;   ESOPHAGOGASTRODUODENOSCOPY (EGD) WITH PROPOFOL N/A 11/07/2020   Procedure: ESOPHAGOGASTRODUODENOSCOPY (EGD) WITH PROPOFOL;  Surgeon: Rush Landmark Telford Nab., MD;  Location:  [place];  Service: Gastroenterology;  Laterality: N/A;   ESOPHAGOGASTRODUODENOSCOPY (EGD) WITH PROPOFOL N/A 12/20/2020   Procedure: ESOPHAGOGASTRODUODENOSCOPY (EGD) WITH PROPOFOL;  Surgeon: Rush Landmark Telford Nab., MD;  [facility];  Service: Gastroenterology;  Laterality: N/A;   ESOPHAGOGASTRODUODENOSCOPY (EGD) WITH PROPOFOL N/A 06/26/2021   Procedure: ESOPHAGOGASTRODUODENOSCOPY (EGD) WITH PROPOFOL;  Surgeon: Rush Landmark Telford Nab., MD;  Location: [place];  Service: Gastroenterology;  Laterality: N/A;   ESOPHAGOGASTRODUODENOSCOPY (EGD) WITH PROPOFOL N/A 07/24/2021   Procedure: ESOPHAGOGASTRODUODENOSCOPY (EGD) WITH PROPOFOL;  Surgeon: Rush Landmark Telford Nab., MD;  Location: [place];  Service: Gastroenterology;  Laterality: N/A;  AXIOS STENT   ESOPHAGOGASTRODUODENOSCOPY (EGD) WITH PROPOFOL N/A 05/22/2022   Procedure: ESOPHAGOGASTRODUODENOSCOPY (EGD) WITH PROPOFOL;  Surgeon: Rush Landmark Telford Nab., MD;  [facility];  Service: Gastroenterology;  Laterality: N/A;   EUS  11/14/2020   Procedure: UPPER ENDOSCOPIC ULTRASOUND (EUS) LINEAR;  Surgeon: Irving Copas., MD;  Location: [place];  Service: Gastroenterology;;   EUS N/A 06/26/2021   Procedure: UPPER ENDOSCOPIC ULTRASOUND (EUS) RADIAL;  Surgeon: Irving Copas., MD;  Location: [place];  Service: Gastroenterology;  Laterality: N/A;   EUS N/A 05/22/2022   Procedure: UPPER ENDOSCOPIC ULTRASOUND (EUS) LINEAR;  Surgeon: Irving Copas., MD;  [facility];  Service: Gastroenterology;  Laterality: N/A;   FINE NEEDLE ASPIRATION  05/22/2022   Procedure: FINE NEEDLE ASPIRATION;  Surgeon: Rush Landmark Telford Nab., MD;  Location[facility];  Service: Gastroenterology;;   GASTROINTESTINAL STENT REMOVAL  12/20/2020   Procedure: GASTROINTESTINAL STENT REMOVAL;  Surgeon: Irving Copas., MD;  [facility];  Service: Gastroenterology;;  cyst gastrostomy stent and  double pig tail stents x2 removed   HERNIA REPAIR Left 2002   groin   INGUINAL HERNIA REPAIR Left 04/05/2017   Procedure: RECURRENT HERNIA REPAIR INGUINAL ADULT WITH MESH;  Surgeon: [name], MD;  Location[facility];  Service: General;  Laterality: Left;   MASS EXCISION Right  04/29/2020   Procedure: EXCISION MASS RIGHT WRIST;  Surgeon: [name], MD;  Location: [place];  Service: Orthopedics;  Laterality: Right;   MASS EXCISION Right 10/09/2020   Procedure: EXCISION MASS, ABDOMINAL WALL, 2CM;  Surgeon: Virl Cagey, MD;  Location[facility];  Service: General;  Laterality: Right;   PANCREATIC STENT PLACEMENT  06/26/2021   Procedure: AXIOS STENT PLACEMENT;  Surgeon: Irving Copas., MD;  Location: [place];  Service: Gastroenterology;;   REMOVAL OF STONES  12/20/2020   Procedure: REMOVAL OF STONES;  Surgeon: Irving Copas., MD;  Location[facility];  Service: Gastroenterology;;   REMOVAL OF STONES  07/24/2021   Procedure: REMOVAL OF STONES;  Surgeon: Irving Copas., MD;  Location: [place];  Service: Gastroenterology;;   REMOVAL OF STONES  05/22/2022   Procedure: REMOVAL OF STONES;  Surgeon: Irving Copas., MD;  Location[facility];  Service: Gastroenterology;;   Joan Mayans N/A 10/10/2020   Procedure: Joan Mayans;  Surgeon: Rogene Houston, MD;  Location[facility];  Service: Endoscopy;  Laterality: N/A;   SPHINCTEROTOMY  07/24/2021   Procedure: SPHINCTEROTOMY;  Surgeon: Mansouraty, Telford Nab., MD;  Location: [place];  Service: Gastroenterology;;   Bess Kinds CHOLANGIOSCOPY N/A 12/20/2020   Procedure: BWIOMBTD CHOLANGIOSCOPY;  Surgeon: Irving Copas., MD;  [facility];  Service: Gastroenterology;  Laterality: N/A;   SPYGLASS CHOLANGIOSCOPY N/A 07/24/2021   Procedure: SPYGLASS CHOLANGIOSCOPY;  Surgeon: Irving Copas., MD;  Location: [place];  Service: Gastroenterology;   Laterality: N/A;   STENT REMOVAL  11/09/2020   Procedure: STENT REMOVAL;  Surgeon: Irving Copas., MD;  Location: [place];  Service: Gastroenterology;;   Lavell Islam REMOVAL  11/11/2020   Procedure: STENT REMOVAL;  Surgeon: Irving Copas., MD;  Location: [place];  Service: Gastroenterology;;   Lavell Islam REMOVAL  11/14/2020   Procedure: STENT REMOVAL;  Surgeon: Irving Copas., MD;  Location: [place];  Service: Gastroenterology;;   Lavell Islam REMOVAL  12/20/2020   Procedure: STENT REMOVAL;  Surgeon: Irving Copas., MD;  Location[facility];  Service: Gastroenterology;;  biliary x2   STENT REMOVAL  07/24/2021   Procedure: AXIOS STENT REMOVAL;  Surgeon: Irving Copas., MD;  Location: [place];  Service: Gastroenterology;;   UPPER ESOPHAGEAL ENDOSCOPIC ULTRASOUND (EUS) N/A 11/07/2020   Procedure: UPPER ESOPHAGEAL ENDOSCOPIC ULTRASOUND (EUS);  Surgeon: Irving Copas., MD;  Location: [place];  Service: Gastroenterology;  Laterality: N/A;   UPPER GASTROINTESTINAL ENDOSCOPY     WOUND DEBRIDEMENT  11/09/2020   Procedure: CYST NECROSECTOMY;  Surgeon: Irving Copas., MD;  Location: [facility];  Service: Gastroenterology;;    Family History  Problem Relation Age of Onset   Diabetes Father    Heart disease Father    Heart disease Maternal Grandmother    Stroke Maternal Grandfather    Colon cancer Neg Hx    Esophageal cancer Neg Hx    Inflammatory bowel disease Neg Hx    Liver disease Neg Hx    Pancreatic cancer Neg Hx    Rectal cancer Neg Hx    Stomach cancer Neg Hx      Social History   Tobacco Use   Smoking status: Every Day    Packs/day: 0.25    Years: 26.00    Total pack years: 6.50    Types: Cigarettes   Smokeless tobacco: Never   Tobacco comments:    4-5 cig daily as of 10/07/2020.  Vaping Use   Vaping Use: Never used  Substance Use Topics   Alcohol use: No   Drug use: No    Prior to Admission  medications   Medication Sig Start Date End Date Taking? Authorizing Provider  lipase/protease/amylase (CREON) 36000 UNITS CPEP capsule Take 2 capsules (72,000 Units total) by mouth 3 (three) times daily with meals. 05/24/22  Yes Shalhoub, Sherryll Burger, MD  ondansetron (ZOFRAN-ODT) 4 MG disintegrating tablet Take 1 tablet (4 mg total) by mouth every 8 (eight) hours as needed for nausea or vomiting. 06/11/22  Yes Dettinger, Fransisca Kaufmann, MD  pantoprazole (PROTONIX) 40 MG tablet Take 1 tablet (40 mg total) by mouth daily. 06/11/22   Dettinger, Fransisca Kaufmann, MD    Current Facility-Administered Medications  Medication Dose Route Frequency Provider Last Rate Last Admin   0.9 %  sodium chloride infusion   Intravenous Continuous Shela Leff, MD   Stopped at 06/13/22 0713   ceFEPIme (MAXIPIME) 2 g in sodium chloride 0.9 % 100 mL IVPB  2 g Intravenous Q8H Thomes Lolling, RPH 200 mL/hr at 06/13/22 0915 2 g at 06/13/22 0915   HYDROmorphone (DILAUDID) injection 0.5 mg  0.5 mg Intravenous Q4H PRN Shela Leff, MD   0.5 mg at 06/13/22 0844   metroNIDAZOLE (FLAGYL) IVPB 500 mg  500 mg Intravenous Joretta Bachelor, MD   Stopped at 06/13/22 0714   naloxone (NARCAN) injection 0.4 mg  0.4 mg Intravenous PRN Shela Leff, MD       ondansetron [facility]) injection 4 mg  4 mg Intravenous Q6H PRN Shela Leff, MD       Current Outpatient Medications  Medication Sig Dispense Refill   lipase/protease/amylase (CREON) 36000 UNITS CPEP capsule Take 2 capsules (72,000 Units total) by mouth 3 (three) times daily with meals. 270 capsule 1   ondansetron (ZOFRAN-ODT) 4 MG disintegrating tablet Take 1 tablet (4 mg total) by mouth every 8 (eight) hours as needed for nausea or vomiting. 60 tablet 1   pantoprazole (PROTONIX) 40 MG tablet Take 1 tablet (40 mg total) by mouth daily. 30 tablet 1    Allergies as of 06/12/2022 - Review Complete 06/12/2022  Allergen Reaction Noted   Desipramine Nausea Only and  Rash 08/24/2012     Review of Systems:    As per HPI, otherwise negative    Physical Exam:  Vital signs in last 24 hours: Temp:  [97.7 F (36.5 C)-98.4 F (36.9 C)] 97.7 F (36.5 C) (12/09 0835) Pulse Rate:  [61-117] 66 (12/09 0827) Resp:  [14-22] 16 (12/09 0827) BP: (130-160)/(97-117) 142/100 (12/09 0827) SpO2:  [92 %-98 %] 98 % (12/09 0827)   General:   Pleasant Caucasian male in NAD Head:  Normocephalic and atraumatic. Eyes:   No icterus.   Conjunctiva pink. Ears:  Normal auditory acuity. Neck:  Supple Lungs:  Respirations even and unlabored. Lungs clear to auscultation bilaterally.   No wheezes, crackles, or rhonchi.  Heart:  Regular rate and rhythm; no MRG Abdomen:  Soft, nondistended, tenderness to light palpation in the epigastrium, no rigidity or guarding. Normal bowel sounds. No appreciable masses or hepatomegaly.  Rectal:  Not performed.  Msk:  Symmetrical without gross deformities.  Extremities:  Without edema. Neurologic:  Alert and  oriented x4;  grossly normal neurologically. Skin:  Intact without significant lesions or rashes. Psych:  Alert and cooperative. Normal affect.  LAB RESULTS: Recent Labs    06/11/22 1212 06/12/22 1942 06/13/22 0432  WBC 12.2* 14.9* 11.3*  HGB 19.9* 18.3* 15.9  HCT 58.2* 51.9 45.6  PLT 426  439* 348   BMET Recent Labs    06/11/22 1212 06/12/22 1942 06/13/22 0432  NA 140 134* 132*  K 4.0 2.9* 3.3*  CL 94* 93* 94*  CO2 '24 26 27  '$ GLUCOSE 187* 194* 152*  BUN 25* 39* 34*  CREATININE 1.07 1.64* 1.25*  CALCIUM 10.1 9.5 8.5*   LFT Recent Labs    06/13/22 0432  PROT 6.9  ALBUMIN 3.5  AST 19  ALT 110*  ALKPHOS 554*  BILITOT 0.9   PT/INR No results for input(s): "LABPROT", "INR" in the last 72 hours.  STUDIES: CT ABDOMEN PELVIS W CONTRAST  Result Date: 06/13/2022 CLINICAL DATA:  Abdominal pain, acute, nonlocalized. Nausea, vomiting, diarrhea. EXAM: CT ABDOMEN AND PELVIS WITH CONTRAST TECHNIQUE: Multidetector CT  imaging of the abdomen and pelvis was performed using the standard protocol following bolus administration of intravenous contrast. RADIATION DOSE REDUCTION: This exam was performed according to the departmental dose-optimization program which includes automated exposure control, adjustment of the mA and/or kV according to patient size and/or use of iterative reconstruction technique. CONTRAST:  164m OMNIPAQUE IOHEXOL 300 MG/ML  SOLN COMPARISON:  05/17/2022, MRI 05/19/2022 FINDINGS: Lower chest: No acute abnormality. Hepatobiliary: Mild intra and moderate extrahepatic biliary ductal dilation has progressed in the interval with the extrahepatic bile duct measuring up to 17 mm in diameter. There is relatively abrupt narrowing of the distal extrahepatic duct, best seen on axial image # 35 and coronal image # 78 which may relate to inflammatory change related to recent endoscopic procedure, acute inflammatory process involving the pancreas, or mass effect related to the enlarging pancreatic pseudocyst. Areas of probable geographic fatty hepatic infiltration with central left hepatic lobe are unchanged. No enhancing intrahepatic mass. Cholecystectomy has been performed. Pancreas: The dominant pancreatic pseudocyst within the mid body of the pancreas has enlarged, now measuring 7.2 x 7.9 x 8.9 cm in greatest dimension on axial image # 30 and coronal image # 68. This demonstrates mass effect upon the distal body of the stomach anteriorly and portosplenic confluence which is nearly obliterated posteriorly. The pancreatic parenchyma within the mid body of the pancreas is not well visualized in this may reflect the sequela of necrotizing pancreatitis with resultant separation of the pancreatic tail which demonstrates normal enhancement from the residual pancreatic head. The pseudocyst appears to communicate with the main duct within the pancreatic tail best seen on coronal image # 82/4. Finally, there is superimposed  peripancreatic inflammatory changes involving the head of the pancreas which may reflect residual or recurrent changes of acute pancreatitis. Spleen: Unremarkable Adrenals/Urinary Tract: Adrenal glands are unremarkable. Kidneys are normal, without renal calculi, focal lesion, or hydronephrosis. Bladder is unremarkable. Stomach/Bowel: Post inflammatory adhesions with tethering of the mid transverse colon are again identified. There are inflammatory changes involving the second and third portion of the duodenum with hyperemia and mild wall thickening identified, likely related to the adjacent inflammatory process involving the pancreas. No evidence of obstruction. The stomach, small bowel, and large bowel are otherwise unremarkable. Appendix normal. No free intraperitoneal gas or fluid. Vascular/Lymphatic: As noted above, marked mass effect with near obliteration of the terminal splenic vein, superior mesenteric vein, and proximal portal vein at the portosplenic confluence. The abdominal vasculature is otherwise unremarkable. Reproductive: Prostate is unremarkable. Other: Small fat containing right inguinal hernia again identified. Musculoskeletal: Are degenerative changes are seen within the lumbar spine at L3-4. No acute bone abnormality. No lytic or blastic bone lesion IMPRESSION: 1. Interval enlargement of a dominant pancreatic pseudocyst within  the mid body of the pancreas, now measuring 8.9 cm in greatest dimension. This demonstrates mass effect upon the distal body of the stomach anteriorly and portosplenic confluence which is nearly obliterated posteriorly. This may result from necrosis of the mid body of the pancreas, separation of the viable pancreatic tail, and communication of the collection with the main duct of the a tail of the pancreas. Gastroenterology consultation for endoscopic cyst-gastrostomy be helpful for further management. 2. Superimposed peripancreatic inflammatory changes, possibly  representing residual or recurrent acute pancreatitis. Secondary inflammatory changes involving the second and third portion of the duodenum without associated obstruction. 3. Progressive intra and moderate extrahepatic biliary ductal dilation with relatively abrupt narrowing of the distal extrahepatic duct. This may relate to inflammatory change related to recent endoscopic procedure, acute inflammatory process involving the pancreas, or mass effect related to the enlarging pancreatic pseudocyst. 4. Post inflammatory adhesions with tethering of the mid transverse colon again identified. No evidence of obstruction. Electronically Signed   By: Fidela Salisbury M.D.   On: 06/13/2022 00:34     PREVIOUS ENDOSCOPIES:            ERCP May 22, 2022 Edematous papilla, slightly stenotic prior sphincterotomy and distal narrowing in the duct, treated with balloon sphincteroplasty Sludge in the common bile duct, removed with balloon sweep Irregularity in ventral pancreatic duct and head of pancreas concerning for ductal disruption  EUS May 22, 2022 Extrinsic compression of the stomach in the antrum, not able to traverse with standard endoscope due to looping, required pediatric colonoscope and Jagwire placement to advanced EUS scope into the duodenum Anechoic lesion in the genu 5 x 4 cm drained with needle aspiration (150 mL of fluid) Dilation of common bile duct to 14 mm Atrophy, lobularity and hyperechoic stranding and pancreas Dilated pancreatic duct up to 5 mm in the neck/body   Impression / Plan:   51 year old male with history of complicated necrotizing gallstone pancreatitis, with recurrent pseudocyst formation, previously treated with cyst gastrostomy, with recent admission for acute on chronic pancreatitis, found to have sludge in the common bile duct.  ERCP findings last admission were concerning for pancreatic duct disruption.  A 5 x 4 cm pseudocyst was drained via EUS at that time.  His  symptoms improved following this drainage, but have subsequently recurred, and now he has evidence of a enlarging pseudocyst approaching 9 cm in size with mass effect on the stomach and likely compression of the common bile duct.  His bilirubin level is normal indicating adequate drainage of the bile duct. The rapid recurrence of this large pseudocyst likely confirms Dr. Donneta Romberg suspicion of pancreatic ductal disruption.  He will need more definitive treatment with either permanent cystgastrostomy or surgery. No no role for urgent ERCP/biliary stenting this weekend.  Patient will need to be discussed with Dr. Rush Landmark and hepatobiliary surgery as to best course of management on Monday. For today and tomorrow, continue with conservative/supportive management with IV hydration and pain/nausea control.  Okay to advance diet to clear liquids if he tolerates.  Acute on chronic pancreatitis with recurrent symptomatic pseudocyst and likely pancreatic duct disruption - Continue current supportive care today and tomorrow; more definitive care to be discussed between Dr. Rush Landmark and hepatobiliary surgery on Monday -Agree with empiric antibiotics for now, but this can likely be discontinued soon if continued clinical improvement - Patient requests increasing or changing pain medications; defer to hospitalist for management of pain meds -Okay for clear liquid diet from GI standpoint, if  he can tolerate  Thanks   LOS: 0 days   Daryel November  06/13/2022, 10:46 AM

## 2022-06-13 NOTE — ED Notes (Signed)
Called 4W to give verbal reports and nurse was off floor will call back in 5 mins

## 2022-06-13 NOTE — Progress Notes (Signed)
Pharmacy Antibiotic Note  Steve Romero is a 51 y.o. male admitted on 06/12/2022 with sepsis/  intra-abdominal infection secondary to complicated recurrent acute pancreatitis with pseudocyst. Pharmacy has been consulted for Cefepime dosing.  AKI on admit (baseline Scr ~ 0.9).  Renal function borderline for dose reduction.  Plan: Cefepime 2gm IV q8h Monitor renal function and cx data      Temp (24hrs), Avg:98.3 F (36.8 C), Min:98.2 F (36.8 C), Max:98.4 F (36.9 C)  Recent Labs  Lab 06/11/22 1212 06/12/22 1942  WBC 12.2* 14.9*  CREATININE 1.07 1.64*    Estimated Creatinine Clearance: 60 mL/min (A) (by C-G formula based on SCr of 1.64 mg/dL (H)).    Allergies  Allergen Reactions   Desipramine Nausea Only and Rash    Antimicrobials this admission: 12/9 Cefepime >>  12/9 Metronidazole >>   Dose adjustments this admission:  Microbiology results: Blood Cx:   Thank you for allowing pharmacy to be a part of this patient's care.  Netta Cedars PharmD 06/13/2022 1:33 AM

## 2022-06-13 NOTE — Progress Notes (Addendum)
Progress Note   Patient: Steve Romero IEP:[id number] DOB: 1970/12/04 DOA: 06/12/2022     0 DOS: the patient was seen and examined on 06/13/2022   Brief hospital course: Steve Romero is a 51 y.o. male with medical history significant of anxiety, depression, bipolar disorder, schizoaffective disorder, smoking, COPD, hypertension, hyperlipidemia, BPH, GERD, cholecystectomy in 6606 complicated by bile leak then severe post ERCP necrotizing pancreatitis and development of pseudocyst requiring cystogastrostomy and biliary stent due to obstruction.  Recently admitted 11/12-11/19 for acute pancreatitis with pseudocyst formation and AKI.  MRCP revealed recurrent CBD stone.  Patient underwent ERCP on 11/17 revealing an edematous major papilla, slightly narrowed prior biliary sphincterotomy with fluoroscopic examination suspicious for sludge with distal narrowing noted in the duct.  A balloon sphincteroplasty was performed with sludge in the biliary tree being swept out successfully.  There was an additional irregularity found in the ventral pancreatic duct in the head of the pancreas but evaluation of this was unsuccessful. Biopsy of pseudocyst came back benign.  Romero had recommended repeating CT pancreas protocol in 3 to 4 weeks.   Patient had a hospital follow-up visit with his PCP yesterday and endorsed worsening abdominal pain, nausea, and vomiting.  Sent to the ED for further evaluation, started on IVF and abx with Romero consult.   Assessment and Plan:  SIRS/possible sepsis secondary to complicated recurrent acute pancreatitis with pseudocyst Patient with history of cholecystectomy in 3016 complicated by bile leak then severe post ERCP necrotizing pancreatitis and development of pseudocyst requiring cystogastrostomy and biliary stent due to obstruction.  Recently admitted 11/12-11/19 for acute pancreatitis with pseudocyst formation.  MRCP revealed recurrent CBD stone.  Patient underwent ERCP on 11/17 revealing  an edematous major papilla, slightly narrowed prior biliary sphincterotomy with fluoroscopic examination suspicious for sludge with distal narrowing noted in the duct.  A balloon sphincteroplasty was performed with sludge in the biliary tree being swept out successfully.  There was an additional irregularity found in the ventral pancreatic duct in the head of the pancreas but evaluation of this was unsuccessful.  Biopsy of pseudocyst came back benign.  Patient returns to the ED today due to worsening abdominal pain, nausea, and vomiting, found to have SIRS c/b AKI and hypokalemia. CT abd/pelvis shows worsening pancreatic pseudocyst with mass effect on stomach and surrounding vasculature with superimposed inflammation. Patient met SIRS criteria with tachycardia at the time of presentation now improved with fluids and pain management.  Romero consulted and discussing best management plan.  -AST 27, ALT 152, alk phos 703, T. bili 0.8, lipase 396 -Steve Romero consulted and has been seen -clear liquid diet  -Continue normal saline at 150 cc/h -Blood cultures ordered and started on antibiotics (cefepime and Flagyl) -Continue pain management   AKI prerenal from dehydration. BUN 39, creatinine 1.6 (baseline 0.7-0.9) improved with fluids.  No evidence of obstructive uropathy on CT. -IV fluid hydration -Monitor renal function -Avoid nephrotoxic agents   Hypovolemic hyponatremia Hypokalemia Given 50 meq overnight, 3.3 this morning. Continue IVF for hyponatremia. -Cardiac monitoring -Continue to replace potassium and monitor closely -Magnesium level is normal   Erythrocytosis Possibly due to dehydration/hemoconcentration as platelet count is also slightly up from baseline. -Repeat CBC in a.m.   COPD Documented in the chart and history of smoking but patient is not on inhalers at home.  No wheezing or respiratory distress. -Continue to monitor   Hypertension Not on any medications.  Currently  normotensive. -Continue to monitor   DVT prophylaxis: SCDs Code Status: Full  Code (discussed with the patient) Family Communication: No family available at this time. Consults called: [place] Romero Level of care: Progressive Care Unit Admission status: It is my clinical opinion that admission to INPATIENT is reasonable and necessary because of the expectation that this patient will require hospital care that crosses at least 2 midnights to treat this condition based on the medical complexity of the problems presented.  Given the aforementioned information, the predictability of an adverse outcome is felt to be significant.      Subjective: Feels well, pain well controlled. Has been unable to keep food down since he left hospital. Last BM yesterday.   Physical Exam: Vitals:   06/13/22 0835 06/13/22 1100 06/13/22 1200 06/13/22 1310  BP:  (!) 153/102 (!) 171/112 (!) 140/109  Pulse:  70 70 82  Resp:  _0 Temp: 97.7 F (36.5 C)  98 F (36.7 C) 98.2 F (36.8 C)  TempSrc: Oral     SpO2:  96% 96% 98%   Physical Exam Vitals and nursing note reviewed.  Constitutional:      General: He is not in acute distress.    Appearance: He is not diaphoretic.  HENT:     Head: Atraumatic.  Eyes:     Pupils: Pupils are equal, round, and reactive to light.  Cardiovascular:     Rate and Rhythm: Normal rate and regular rhythm.     Pulses: Normal pulses.     Heart sounds: No murmur heard. Pulmonary:     Effort: Pulmonary effort is normal. No respiratory distress.     Breath sounds: Normal breath sounds. No rales.  Abdominal:     General: Abdomen is flat. There is distension.     Tenderness: There is no rebound.  Musculoskeletal:     Right lower leg: No edema.     Left lower leg: No edema.  Skin:    General: Skin is warm and dry.     Capillary Refill: Capillary refill takes less than 2 seconds.  Neurological:     Mental Status: He is alert and oriented to person, place, and time. Mental  status is at baseline.  Psychiatric:        Mood and Affect: Mood normal.     Data Reviewed:      Latest Ref Rng & Units 06/13/2022    4:32 AM 06/12/2022    7:42 PM 06/11/2022   12:12 PM  CBC  WBC 4.0 - 10.5 K/uL 11.3  14.9  12.2   Hemoglobin 13.0 - 17.0 g/dL 15.9  18.3  19.9   Hematocrit 39.0 - 52.0 % 45.6  51.9  58.2   Platelets 150 - 400 K/uL 348  439  426       Latest Ref Rng & Units 06/13/2022    4:32 AM 06/12/2022    7:42 PM 06/11/2022   12:12 PM  BMP  Glucose 70 - 99 mg/dL 152  194  187   BUN 6 - 20 mg/dL 34  39  25   Creatinine 0.61 - 1.24 mg/dL 1.25  1.64  1.07   BUN/Creat Ratio 9 - 20   23   Sodium 135 - 145 mmol/L 132  134  140   Potassium 3.5 - 5.1 mmol/L 3.3  2.9  4.0   Chloride 98 - 111 mmol/L 94  93  94   CO2 22 - 32 mmol/L _1 Calcium 8.9 - 10.3 mg/dL 8.5  9.5  10.1    CT abd/pelvis: IMPRESSION: 1. Interval enlargement of a dominant pancreatic pseudocyst within the mid body of the pancreas, now measuring 8.9 cm in greatest dimension. This demonstrates mass effect upon the distal body of the stomach anteriorly and portosplenic confluence which is nearly obliterated posteriorly. This may result from necrosis of the mid body of the pancreas, separation of the viable pancreatic tail, and communication of the collection with the main duct of the a tail of the pancreas. Gastroenterology consultation for endoscopic cyst-gastrostomy be helpful for further management. 2. Superimposed peripancreatic inflammatory changes, possibly representing residual or recurrent acute pancreatitis. Secondary inflammatory changes involving the second and third portion of the duodenum without associated obstruction. 3. Progressive intra and moderate extrahepatic biliary ductal dilation with relatively abrupt narrowing of the distal extrahepatic duct. This may relate to inflammatory change related to recent endoscopic procedure, acute inflammatory process involving  the pancreas, or mass effect related to the enlarging pancreatic pseudocyst. 4. Post inflammatory adhesions with tethering of the mid transverse colon again identified. No evidence of obstruction.  Family Communication: d/w patient  Disposition: Status is: Inpatient Remains inpatient appropriate because: sirs, aki, psuedocyst   Planned Discharge Destination: Home    Time spent: 35 minutes  Author: Lorelei Pont, MD 06/13/2022 1:40 PM  For on call review [email or web address].

## 2022-06-14 DIAGNOSIS — K861 Other chronic pancreatitis: Secondary | ICD-10-CM | POA: Diagnosis not present

## 2022-06-14 DIAGNOSIS — K863 Pseudocyst of pancreas: Secondary | ICD-10-CM | POA: Diagnosis not present

## 2022-06-14 DIAGNOSIS — K859 Acute pancreatitis without necrosis or infection, unspecified: Secondary | ICD-10-CM | POA: Diagnosis not present

## 2022-06-14 LAB — CBC
HCT: 45.3 % (ref 39.0–52.0)
Hemoglobin: 15.3 g/dL (ref 13.0–17.0)
MCH: 30.7 pg (ref 26.0–34.0)
MCHC: 33.8 g/dL (ref 30.0–36.0)
MCV: 91 fL (ref 80.0–100.0)
Platelets: 314 10*3/uL (ref 150–400)
RBC: 4.98 MIL/uL (ref 4.22–5.81)
RDW: 12.2 % (ref 11.5–15.5)
WBC: 10.6 10*3/uL — ABNORMAL HIGH (ref 4.0–10.5)
nRBC: 0 % (ref 0.0–0.2)

## 2022-06-14 LAB — BASIC METABOLIC PANEL
Anion gap: 11 (ref 5–15)
BUN: 22 mg/dL — ABNORMAL HIGH (ref 6–20)
CO2: 29 mmol/L (ref 22–32)
Calcium: 8.8 mg/dL — ABNORMAL LOW (ref 8.9–10.3)
Chloride: 96 mmol/L — ABNORMAL LOW (ref 98–111)
Creatinine, Ser: 1.03 mg/dL (ref 0.61–1.24)
GFR, Estimated: >60 mL/min (ref >60)
Glucose, Bld: 156 mg/dL — ABNORMAL HIGH (ref 70–99)
Potassium: 4.2 mmol/L (ref 3.5–5.1)
Sodium: 136 mmol/L (ref 135–145)

## 2022-06-14 MED ORDER — OXYCODONE HCL 5 MG PO TABS
10.0000 mg | ORAL_TABLET | ORAL | Status: DC | PRN
  Administered 2022-06-14 – 2022-06-18 (×13): 10 mg via ORAL
  Filled 2022-06-14 (×13): qty 2

## 2022-06-14 MED ORDER — HYDROMORPHONE HCL 1 MG/ML IJ SOLN
0.5000 mg | INTRAMUSCULAR | Status: DC | PRN
  Administered 2022-06-14 – 2022-06-15 (×4): 0.5 mg via INTRAVENOUS
  Filled 2022-06-14 (×4): qty 0.5

## 2022-06-14 MED ORDER — SODIUM CHLORIDE 0.9 % IV SOLN: INTRAVENOUS | Status: DC

## 2022-06-14 MED ORDER — HYDROMORPHONE HCL 1 MG/ML IJ SOLN: 1.0000 mg | INTRAMUSCULAR | Status: DC | PRN

## 2022-06-14 NOTE — Progress Notes (Signed)
[facility] ROUNDING NOTE   Subjective: No significant changes in last 24 hours.  Patient feels the pain is about the same, maybe even worse.  No vomiting since admission.  He is tolerating a clear liquid diet.  No bowel movement in 2 days.  No fevers or chills.     Objective: Vital signs in last 24 hours: Temp:  [97.6 F (36.4 C)-98.2 F (36.8 C)] 98.2 F (36.8 C) (12/10 0603) Pulse Rate:  [60-82] 62 (12/10 0603) Resp:  [16-18] 18 (12/10 0603) BP: (139-171)/(86-112) 149/97 (12/10 0603) SpO2:  [96 %-100 %] 98 % (12/10 0603) Weight:  [89 kg] 89 kg (12/09 1657) Last BM Date : 06/19/22 General: NAD, Caucasian male sitting in bedside chair Lungs:  CTA b/l, no w/r/r Heart:  RRR, no m/r/g Abdomen:  Soft, tender to palpation in epigastrium/left upper quadrant, ND, +BS Ext:  No c/c/e    Intake/Output from previous day: 12/09 0701 - 12/10 0700 In: 2591.6 [P.O.:480; I.V.:847.5; IV Piggyback:1264.1] Out: -  Intake/Output this shift: No intake/output data recorded.   Lab Results: Recent Labs    06/12/22 1942 06/13/22 0432 06/14/22 0444  WBC 14.9* 11.3* 10.6*  HGB 18.3* 15.9 15.3  PLT 439* 348 314  MCV 87.5 88.7 91.0   BMET Recent Labs    06/12/22 1942 06/13/22 0432 06/14/22 0444  NA 134* 132* 136  K 2.9* 3.3* 4.2  CL 93* 94* 96*  CO2 '26 27 29  '$ GLUCOSE 194* 152* 156*  BUN 39* 34* 22*  CREATININE 1.64* 1.25* 1.03  CALCIUM 9.5 8.5* 8.8*   LFT Recent Labs    06/11/22 1212 06/12/22 1942 06/13/22 0432  PROT 8.1 8.2* 6.9  ALBUMIN 4.8 4.1 3.5  AST 43* 27 19  ALT 266* 152* 110*  ALKPHOS 1,182* 703* 554*  BILITOT 0.6 0.8 0.9   PT/INR No results for input(s): "INR" in the last 72 hours.    Imaging/Other results: CT ABDOMEN PELVIS W CONTRAST  Result Date: 06/13/2022 CLINICAL DATA:  Abdominal pain, acute, nonlocalized. Nausea, vomiting, diarrhea. EXAM: CT ABDOMEN AND PELVIS WITH CONTRAST TECHNIQUE: Multidetector CT imaging of the abdomen and  pelvis was performed using the standard protocol following bolus administration of intravenous contrast. RADIATION DOSE REDUCTION: This exam was performed according to the departmental dose-optimization program which includes automated exposure control, adjustment of the mA and/or kV according to patient size and/or use of iterative reconstruction technique. CONTRAST:  170m OMNIPAQUE IOHEXOL 300 MG/ML  SOLN COMPARISON:  05/17/2022, MRI 05/19/2022 FINDINGS: Lower chest: No acute abnormality. Hepatobiliary: Mild intra and moderate extrahepatic biliary ductal dilation has progressed in the interval with the extrahepatic bile duct measuring up to 17 mm in diameter. There is relatively abrupt narrowing of the distal extrahepatic duct, best seen on axial image # 35 and coronal image # 78 which may relate to inflammatory change related to recent endoscopic procedure, acute inflammatory process involving the pancreas, or mass effect related to the enlarging pancreatic pseudocyst. Areas of probable geographic fatty hepatic infiltration with central left hepatic lobe are unchanged. No enhancing intrahepatic mass. Cholecystectomy has been performed. Pancreas: The dominant pancreatic pseudocyst within the mid body of the pancreas has enlarged, now measuring 7.2 x 7.9 x 8.9 cm in greatest dimension on axial image # 30 and coronal image # 68. This demonstrates mass effect upon the distal body of the stomach anteriorly and portosplenic confluence which is nearly obliterated posteriorly. The pancreatic parenchyma within the mid body of the pancreas is not well visualized in this  may reflect the sequela of necrotizing pancreatitis with resultant separation of the pancreatic tail which demonstrates normal enhancement from the residual pancreatic head. The pseudocyst appears to communicate with the main duct within the pancreatic tail best seen on coronal image # 82/4. Finally, there is superimposed peripancreatic inflammatory  changes involving the head of the pancreas which may reflect residual or recurrent changes of acute pancreatitis. Spleen: Unremarkable Adrenals/Urinary Tract: Adrenal glands are unremarkable. Kidneys are normal, without renal calculi, focal lesion, or hydronephrosis. Bladder is unremarkable. Stomach/Bowel: Post inflammatory adhesions with tethering of the mid transverse colon are again identified. There are inflammatory changes involving the second and third portion of the duodenum with hyperemia and mild wall thickening identified, likely related to the adjacent inflammatory process involving the pancreas. No evidence of obstruction. The stomach, small bowel, and large bowel are otherwise unremarkable. Appendix normal. No free intraperitoneal gas or fluid. Vascular/Lymphatic: As noted above, marked mass effect with near obliteration of the terminal splenic vein, superior mesenteric vein, and proximal portal vein at the portosplenic confluence. The abdominal vasculature is otherwise unremarkable. Reproductive: Prostate is unremarkable. Other: Small fat containing right inguinal hernia again identified. Musculoskeletal: Are degenerative changes are seen within the lumbar spine at L3-4. No acute bone abnormality. No lytic or blastic bone lesion IMPRESSION: 1. Interval enlargement of a dominant pancreatic pseudocyst within the mid body of the pancreas, now measuring 8.9 cm in greatest dimension. This demonstrates mass effect upon the distal body of the stomach anteriorly and portosplenic confluence which is nearly obliterated posteriorly. This may result from necrosis of the mid body of the pancreas, separation of the viable pancreatic tail, and communication of the collection with the main duct of the a tail of the pancreas. Gastroenterology consultation for endoscopic cyst-gastrostomy be helpful for further management. 2. Superimposed peripancreatic inflammatory changes, possibly representing residual or recurrent  acute pancreatitis. Secondary inflammatory changes involving the second and third portion of the duodenum without associated obstruction. 3. Progressive intra and moderate extrahepatic biliary ductal dilation with relatively abrupt narrowing of the distal extrahepatic duct. This may relate to inflammatory change related to recent endoscopic procedure, acute inflammatory process involving the pancreas, or mass effect related to the enlarging pancreatic pseudocyst. 4. Post inflammatory adhesions with tethering of the mid transverse colon again identified. No evidence of obstruction. Electronically Signed   By: Fidela Salisbury M.D.   On: 06/13/2022 00:34      Assessment and Plan:  51 year old male with history of complicated necrotizing gallstone pancreatitis, with recurrent pseudocyst formation, previously treated with cyst gastrostomy, with recent admission for acute on chronic pancreatitis, found to have sludge in the common bile duct.  ERCP findings last admission were concerning for pancreatic duct disruption.  A 5 x 4 cm pseudocyst was drained via EUS at that time.  His symptoms improved following this drainage, but have subsequently recurred, and now he has evidence of a enlarging pseudocyst approaching 9 cm in size with mass effect on the stomach and likely compression of the common bile duct.  His bilirubin level is normal indicating adequate drainage of the bile duct. The rapid recurrence of this large pseudocyst likely confirms Dr. Donneta Romberg suspicion of pancreatic ductal disruption.  He will need more definitive treatment with either permanent cystgastrostomy or surgery. No no role for urgent ERCP/biliary stenting this weekend.  Patient will need to be discussed with Dr. Rush Landmark and hepatobiliary surgery as to best course of management on Monday.    Acute on chronic pancreatitis with recurrent  symptomatic pseudocyst and likely pancreatic duct disruption - Continue current supportive care,  defer pain regiment to hospitalist - More definitive management of pseudocyst to be discussed between Dr. Rush Landmark and hepatobiliary surgery on Monday -Agree with empiric antibiotics for now, but this can likely be discontinued soon if continued clinical improvement - Continue clear liquid diet   Dr. Rush Landmark to assume inpatient GI care tomorrow    Daryel November, MD  06/14/2022, 11:41 AM [facility]

## 2022-06-14 NOTE — Progress Notes (Addendum)
Progress Note   Patient: Steve Romero OVF:[id number] DOB: Feb 12, 1971 DOA: 06/12/2022     1 DOS: the patient was seen and examined on 06/14/2022   Brief hospital course: [name] is a 51 y.o. male with medical history significant of anxiety, depression, bipolar disorder, schizoaffective disorder, smoking, COPD, hypertension, hyperlipidemia, BPH, GERD, cholecystectomy in 8416 complicated by bile leak then severe post ERCP necrotizing pancreatitis and development of pseudocyst requiring cystogastrostomy and biliary stent due to obstruction.  Recently admitted 11/12-11/19 for acute pancreatitis with pseudocyst formation and AKI.  MRCP revealed recurrent CBD stone.  Patient underwent ERCP on 11/17 revealing an edematous major papilla, slightly narrowed prior biliary sphincterotomy with fluoroscopic examination suspicious for sludge with distal narrowing noted in the duct.  A balloon sphincteroplasty was performed with sludge in the biliary tree being swept out successfully.  There was an additional irregularity found in the ventral pancreatic duct in the head of the pancreas but evaluation of this was unsuccessful. Biopsy of pseudocyst came back benign.  GI had recommended repeating CT pancreas protocol in 3 to 4 weeks.   Patient had a hospital follow-up visit with his PCP yesterday and endorsed worsening abdominal pain, nausea, and vomiting.  Sent to the ED for further evaluation, started on IVF and abx with GI consult.   Assessment and Plan:  SIRS/possible sepsis secondary to complicated recurrent acute pancreatitis with pseudocyst Patient with history of cholecystectomy in 6063 complicated by bile leak then severe post ERCP necrotizing pancreatitis and development of pseudocyst requiring cystogastrostomy and biliary stent due to obstruction.  Recently admitted 11/12-11/19 for acute pancreatitis with pseudocyst formation.  MRCP revealed recurrent CBD stone.  Patient underwent ERCP on 11/17  revealing an edematous major papilla, slightly narrowed prior biliary sphincterotomy with fluoroscopic examination suspicious for sludge with distal narrowing noted in the duct.  A balloon sphincteroplasty was performed with sludge in the biliary tree being swept out successfully.  There was an additional irregularity found in the ventral pancreatic duct in the head of the pancreas but evaluation of this was unsuccessful.  Biopsy of pseudocyst came back benign.  Patient returns to the ED today due to worsening abdominal pain, nausea, and vomiting, found to have SIRS c/b AKI and hypokalemia. CT abd/pelvis shows worsening pancreatic pseudocyst with mass effect on stomach and surrounding vasculature with superimposed inflammation. Patient met SIRS criteria with tachycardia at the time of presentation now improved with fluids and pain management.  GI consulted and discussing best management plan on Monday. -[facility] consulted and will discuss with hepatobiliary best management plan on Monday -surgery consulted will see Monday  -clear liquid diet, n.p.o. at midnight -Blood cultures ordered and started on antibiotics (cefepime and Flagyl), DC 12/11 if no growth to date. -Continue pain management   AKI, resolved prerenal from dehydration. BUN 39, creatinine 1.6 (baseline 0.7-0.9) improved with fluids.  No evidence of obstructive uropathy on CT.  Hypovolemic hyponatremia, resolved Hypokalemia, resolved Given 50 meq overnight, 3.3 this morning.  Given additional 30 mill equivalents of potassium on 12/9.  Hyponatremia resolved with fluids.    COPD Documented in the chart and history of smoking but patient is not on inhalers at home.  No wheezing or respiratory distress. -Continue to monitor   Hypertension Not on any medications.  Currently normotensive. -Continue to monitor   DVT prophylaxis: SCDs Code Status: Full Code (discussed with the patient) Family Communication: No family available at this  time. Consults called:  GI Level of care: Progressive Care Unit Admission  status: It is my clinical opinion that admission to INPATIENT is reasonable and necessary because of the expectation that this patient will require hospital care that crosses at least 2 midnights to treat this condition based on the medical complexity of the problems presented.  Given the aforementioned information, the predictability of an adverse outcome is felt to be significant.      Subjective:   Patient reports still having difficulty with pain control otherwise no other complaints.  Physical Exam: Vitals:   06/13/22 2227 06/14/22 0235 06/14/22 0603 06/14/22 1344  BP: (!) 149/97 139/86 (!) 149/97 (!) 142/90  Pulse: 67 73 62 63  Resp: _0 Temp: 98.1 F (36.7 C) 98.1 F (36.7 C) 98.2 F (36.8 C) 97.8 F (36.6 C)  TempSrc: Oral Oral Oral Oral  SpO2: 100% 96% 98% 100%  Weight:      Height:       Physical Exam Vitals and nursing note reviewed.  Constitutional:      General: He is not in acute distress.    Appearance: He is not diaphoretic.  HENT:     Head: Atraumatic.  Eyes:     Pupils: Pupils are equal, round, and reactive to light.  Cardiovascular:     Rate and Rhythm: Normal rate and regular rhythm.     Pulses: Normal pulses.     Heart sounds: No murmur heard. Pulmonary:     Effort: Pulmonary effort is normal. No respiratory distress.     Breath sounds: Normal breath sounds. No rales.  Abdominal:     General: Abdomen is flat. There is distension.     Tenderness: There is no rebound.  Musculoskeletal:     Right lower leg: No edema.     Left lower leg: No edema.  Skin:    General: Skin is warm and dry.     Capillary Refill: Capillary refill takes less than 2 seconds.  Neurological:     Mental Status: He is alert and oriented to person, place, and time. Mental status is at baseline.  Psychiatric:        Mood and Affect: Mood normal.     Data Reviewed:      Latest  Ref Rng & Units 06/14/2022    4:44 AM 06/13/2022    4:32 AM 06/12/2022    7:42 PM  CBC  WBC 4.0 - 10.5 K/uL 10.6  11.3  14.9   Hemoglobin 13.0 - 17.0 g/dL 15.3  15.9  18.3   Hematocrit 39.0 - 52.0 % 45.3  45.6  51.9   Platelets 150 - 400 K/uL 314  348  439       Latest Ref Rng & Units 06/14/2022    4:44 AM 06/13/2022    4:32 AM 06/12/2022    7:42 PM  BMP  Glucose 70 - 99 mg/dL 156  152  194   BUN 6 - 20 mg/dL 22  34  39   Creatinine 0.61 - 1.24 mg/dL 1.03  1.25  1.64   Sodium 135 - 145 mmol/L 136  132  134   Potassium 3.5 - 5.1 mmol/L 4.2  3.3  2.9   Chloride 98 - 111 mmol/L 96  94  93   CO2 22 - 32 mmol/L _1 Calcium 8.9 - 10.3 mg/dL 8.8  8.5  9.5    CT abd/pelvis: IMPRESSION: 1. Interval enlargement of a dominant pancreatic pseudocyst within the mid body of the pancreas, now  measuring 8.9 cm in greatest dimension. This demonstrates mass effect upon the distal body of the stomach anteriorly and portosplenic confluence which is nearly obliterated posteriorly. This may result from necrosis of the mid body of the pancreas, separation of the viable pancreatic tail, and communication of the collection with the main duct of the a tail of the pancreas. Gastroenterology consultation for endoscopic cyst-gastrostomy be helpful for further management. 2. Superimposed peripancreatic inflammatory changes, possibly representing residual or recurrent acute pancreatitis. Secondary inflammatory changes involving the second and third portion of the duodenum without associated obstruction. 3. Progressive intra and moderate extrahepatic biliary ductal dilation with relatively abrupt narrowing of the distal extrahepatic duct. This may relate to inflammatory change related to recent endoscopic procedure, acute inflammatory process involving the pancreas, or mass effect related to the enlarging pancreatic pseudocyst. 4. Post inflammatory adhesions with tethering of the mid  transverse colon again identified. No evidence of obstruction.  Family Communication: d/w patient  Disposition: Status is: Inpatient Remains inpatient appropriate because: sirs, aki, psuedocyst   Planned Discharge Destination: Home    Time spent: 35 minutes  Author: Lorelei Pont, MD 06/14/2022 1:57 PM  For on call review [email or web address].

## 2022-06-15 ENCOUNTER — Encounter (HOSPITAL_COMMUNITY): Payer: Self-pay | Admitting: Internal Medicine

## 2022-06-15 DIAGNOSIS — K863 Pseudocyst of pancreas: Secondary | ICD-10-CM

## 2022-06-15 DIAGNOSIS — N179 Acute kidney failure, unspecified: Secondary | ICD-10-CM

## 2022-06-15 DIAGNOSIS — K859 Acute pancreatitis without necrosis or infection, unspecified: Secondary | ICD-10-CM

## 2022-06-15 DIAGNOSIS — R112 Nausea with vomiting, unspecified: Secondary | ICD-10-CM

## 2022-06-15 DIAGNOSIS — R197 Diarrhea, unspecified: Secondary | ICD-10-CM

## 2022-06-15 DIAGNOSIS — K831 Obstruction of bile duct: Secondary | ICD-10-CM

## 2022-06-15 DIAGNOSIS — K862 Cyst of pancreas: Secondary | ICD-10-CM

## 2022-06-15 HISTORY — DX: Nausea with vomiting, unspecified: R11.2

## 2022-06-15 LAB — CBC
HCT: 43.3 % (ref 39.0–52.0)
Hemoglobin: 14.7 g/dL (ref 13.0–17.0)
MCH: 30.8 pg (ref 26.0–34.0)
MCHC: 33.9 g/dL (ref 30.0–36.0)
MCV: 90.8 fL (ref 80.0–100.0)
Platelets: 283 10*3/uL (ref 150–400)
RBC: 4.77 MIL/uL (ref 4.22–5.81)
RDW: 11.9 % (ref 11.5–15.5)
WBC: 8.7 10*3/uL (ref 4.0–10.5)
nRBC: 0 % (ref 0.0–0.2)

## 2022-06-15 LAB — COMPREHENSIVE METABOLIC PANEL
ALT: 67 U/L — ABNORMAL HIGH (ref 0–44)
AST: 21 U/L (ref 15–41)
Albumin: 3.5 g/dL (ref 3.5–5.0)
Alkaline Phosphatase: 436 U/L — ABNORMAL HIGH (ref 38–126)
Anion gap: 7 (ref 5–15)
BUN: 12 mg/dL (ref 6–20)
CO2: 31 mmol/L (ref 22–32)
Calcium: 8.9 mg/dL (ref 8.9–10.3)
Chloride: 99 mmol/L (ref 98–111)
Creatinine, Ser: 0.87 mg/dL (ref 0.61–1.24)
GFR, Estimated: >60 mL/min (ref >60)
Glucose, Bld: 128 mg/dL — ABNORMAL HIGH (ref 70–99)
Potassium: 4.2 mmol/L (ref 3.5–5.1)
Sodium: 137 mmol/L (ref 135–145)
Total Bilirubin: 1.2 mg/dL (ref 0.3–1.2)
Total Protein: 6.3 g/dL — ABNORMAL LOW (ref 6.5–8.1)

## 2022-06-15 MED ORDER — ENSURE ENLIVE PO LIQD
237.0000 mL | Freq: Two times a day (BID) | ORAL | Status: DC
  Administered 2022-06-15 – 2022-06-18 (×4): 237 mL via ORAL

## 2022-06-15 MED ORDER — HYDROMORPHONE HCL 1 MG/ML IJ SOLN
0.5000 mg | INTRAMUSCULAR | Status: DC | PRN
  Administered 2022-06-15 – 2022-06-18 (×10): 0.5 mg via INTRAVENOUS
  Filled 2022-06-15 (×11): qty 0.5

## 2022-06-15 NOTE — Progress Notes (Addendum)
Patient ID: Steve Romero, male   DOB: 10-18-1970, 51 y.o.   MRN: [id number]    Progress Note   Subjective   Day # 3  CC; history of complicated necrotizing gallstone pancreatitis, recurrent pseudocyst formation, recent admission with new pancreatic pseudocyst presents with increased abdominal pain  Cefepime/Flagyl IV  Labs today WBC 8.7/hemoglobin 14.7/hematocrit 43.3 Potassium 4.2/BUN 12/creatinine 0.87 T. bili 1.2/AST 21/ALT 67/alk phos 436  Blood cultures no growth thus far  Repeat CT abdomen pelvis on admission-shows interval enlargement of the dominant pancreatic pseudocyst in the mid body of the pancreas now measuring 8.9 cm this demonstrates mass effect upon the distal body of the stomach anteriorly and portal splenic confluence which is nearly obliterated posteriorly this may be from necrosis of the mid body of the pancreas separation of the viable pancreatic tail and communication of the collection with the main duct at the tail of the pancreas superimposed peripancreatic inflammatory changes, and progressive intra and moderate extrahepatic biliary ductal dilation relatively abrupt narrowing of the distal extrahepatic duct  Patient remains quite uncomfortable, asking if pain medication dosing can be increased.  He has been able to tolerate clear liquids without increase in pain, n.p.o. this morning   Objective   Vital signs in last 24 hours: Temp:  [97.5 F (36.4 C)-97.8 F (36.6 C)] 97.8 F (36.6 C) (12/11 0649) Pulse Rate:  [63-81] 81 (12/11 0649) Resp:  [17-18] 18 (12/11 0649) BP: (127-142)/(84-98) 127/84 (12/11 0649) SpO2:  [98 %-100 %] 98 % (12/11 0649) Last BM Date : 06/15/22 General:   Older white male in NAD, ambulating in the room Heart:  Regular rate and rhythm; no murmurs Lungs: Respirations even and unlabored, lungs CTA bilaterally Abdomen:  Soft, he is quite tender across the upper abdomen, fullness in the epigastrium and hypogastrium, normal bowel  sounds. Extremities:  Without edema. Neurologic:  Alert and oriented,  grossly normal neurologically. Psych:  Cooperative. Normal mood and affect.  Intake/Output from previous day: 12/10 0701 - 12/11 0700 In: 1247.1 [P.O.:600; I.V.:103.4; IV Piggyback:543.6] Out: -  Intake/Output this shift: No intake/output data recorded.  Lab Results: Recent Labs    06/13/22 0432 06/14/22 0444 06/15/22 0422  WBC 11.3* 10.6* 8.7  HGB 15.9 15.3 14.7  HCT 45.6 45.3 43.3  PLT 348 314 283   BMET Recent Labs    06/13/22 0432 06/14/22 0444 06/15/22 0422  NA 132* 136 137  K 3.3* 4.2 4.2  CL 94* 96* 99  CO2 27 29 31   GLUCOSE 152* 156* 128*  BUN 34* 22* 12  CREATININE 1.25* 1.03 0.87  CALCIUM 8.5* 8.8* 8.9   LFT Recent Labs    06/15/22 0422  PROT 6.3*  ALBUMIN 3.5  AST 21  ALT 67*  ALKPHOS 436*  BILITOT 1.2   PT/INR No results for input(s): "LABPROT", "INR" in the last 72 hours.  Studies/Results: No results found.     Assessment / Plan:    #32 51 year old male with history of very complicated necrotizing gallstone pancreatitis, recent admission with recurrent pseudocyst formation. He underwent ERCP last admission with concerns for pancreatic duct disruption, the 5 x 4 cm pseudocyst was aspirated via EUS His symptoms improved postprocedure, however after discharge to home on 05/24/2022 symptoms gradually returned and then worsened.  Repeat imaging shows the dominant pseudocyst to be much larger now measuring 9 cm, exerting mass effect on the stomach and likely compressing the common bile duct and the SMV and proximal portal vein as well as the splenic  vein at the portal splenic confluence  He was started empirically on IV antibiotics on admission, blood cultures are negative-  Plan; okay to discontinue IV antibiotics Decreased interval of Dilaudid dosing to every 3 hours Patient is not scheduled for procedure today, will change to full liquid diet and start protein  supplements With concerns for pancreatic duct disruption, Dr. Meridee Score to review imaging, and decide regarding potential cyst gastrostomy versus surgical cyst gastrostomy/cyst enterostomy       Principal Problem:   Acute recurrent pancreatitis Active Problems:   Essential hypertension   AKI (acute kidney injury) (HCC)   Erythrocytosis   Sepsis (HCC)   Hypokalemia   COPD (chronic obstructive pulmonary disease) (HCC)     LOS: 2 days   Helayna Dun PA-C 06/15/2022, 8:44 AM

## 2022-06-15 NOTE — H&P (View-Only) (Signed)
Patient ID: Holdon Cordner, male   DOB: 10-18-1970, 51 y.o.   MRN: [id number]    Progress Note   Subjective   Day # 3  CC; history of complicated necrotizing gallstone pancreatitis, recurrent pseudocyst formation, recent admission with new pancreatic pseudocyst presents with increased abdominal pain  Cefepime/Flagyl IV  Labs today WBC 8.7/hemoglobin 14.7/hematocrit 43.3 Potassium 4.2/BUN 12/creatinine 0.87 T. bili 1.2/AST 21/ALT 67/alk phos 436  Blood cultures no growth thus far  Repeat CT abdomen pelvis on admission-shows interval enlargement of the dominant pancreatic pseudocyst in the mid body of the pancreas now measuring 8.9 cm this demonstrates mass effect upon the distal body of the stomach anteriorly and portal splenic confluence which is nearly obliterated posteriorly this may be from necrosis of the mid body of the pancreas separation of the viable pancreatic tail and communication of the collection with the main duct at the tail of the pancreas superimposed peripancreatic inflammatory changes, and progressive intra and moderate extrahepatic biliary ductal dilation relatively abrupt narrowing of the distal extrahepatic duct  Patient remains quite uncomfortable, asking if pain medication dosing can be increased.  He has been able to tolerate clear liquids without increase in pain, n.p.o. this morning   Objective   Vital signs in last 24 hours: Temp:  [97.5 F (36.4 C)-97.8 F (36.6 C)] 97.8 F (36.6 C) (12/11 0649) Pulse Rate:  [63-81] 81 (12/11 0649) Resp:  [17-18] 18 (12/11 0649) BP: (127-142)/(84-98) 127/84 (12/11 0649) SpO2:  [98 %-100 %] 98 % (12/11 0649) Last BM Date : 06/15/22 General:   Older white male in NAD, ambulating in the room Heart:  Regular rate and rhythm; no murmurs Lungs: Respirations even and unlabored, lungs CTA bilaterally Abdomen:  Soft, he is quite tender across the upper abdomen, fullness in the epigastrium and hypogastrium, normal bowel  sounds. Extremities:  Without edema. Neurologic:  Alert and oriented,  grossly normal neurologically. Psych:  Cooperative. Normal mood and affect.  Intake/Output from previous day: 12/10 0701 - 12/11 0700 In: 1247.1 [P.O.:600; I.V.:103.4; IV Piggyback:543.6] Out: -  Intake/Output this shift: No intake/output data recorded.  Lab Results: Recent Labs    06/13/22 0432 06/14/22 0444 06/15/22 0422  WBC 11.3* 10.6* 8.7  HGB 15.9 15.3 14.7  HCT 45.6 45.3 43.3  PLT 348 314 283   BMET Recent Labs    06/13/22 0432 06/14/22 0444 06/15/22 0422  NA 132* 136 137  K 3.3* 4.2 4.2  CL 94* 96* 99  CO2 27 29 31   GLUCOSE 152* 156* 128*  BUN 34* 22* 12  CREATININE 1.25* 1.03 0.87  CALCIUM 8.5* 8.8* 8.9   LFT Recent Labs    06/15/22 0422  PROT 6.3*  ALBUMIN 3.5  AST 21  ALT 67*  ALKPHOS 436*  BILITOT 1.2   PT/INR No results for input(s): "LABPROT", "INR" in the last 72 hours.  Studies/Results: No results found.     Assessment / Plan:    #32 51 year old male with history of very complicated necrotizing gallstone pancreatitis, recent admission with recurrent pseudocyst formation. He underwent ERCP last admission with concerns for pancreatic duct disruption, the 5 x 4 cm pseudocyst was aspirated via EUS His symptoms improved postprocedure, however after discharge to home on 05/24/2022 symptoms gradually returned and then worsened.  Repeat imaging shows the dominant pseudocyst to be much larger now measuring 9 cm, exerting mass effect on the stomach and likely compressing the common bile duct and the SMV and proximal portal vein as well as the splenic  vein at the portal splenic confluence  He was started empirically on IV antibiotics on admission, blood cultures are negative-  Plan; okay to discontinue IV antibiotics Decreased interval of Dilaudid dosing to every 3 hours Patient is not scheduled for procedure today, will change to full liquid diet and start protein  supplements With concerns for pancreatic duct disruption, Dr. Meridee Score to review imaging, and decide regarding potential cyst gastrostomy versus surgical cyst gastrostomy/cyst enterostomy       Principal Problem:   Acute recurrent pancreatitis Active Problems:   Essential hypertension   AKI (acute kidney injury) (HCC)   Erythrocytosis   Sepsis (HCC)   Hypokalemia   COPD (chronic obstructive pulmonary disease) (HCC)     LOS: 2 days   Helayna Dun PA-C 06/15/2022, 8:44 AM

## 2022-06-15 NOTE — Anesthesia Preprocedure Evaluation (Signed)
Anesthesia Evaluation  Patient identified by MRN, date of birth, ID band Patient awake    Reviewed: Allergy & Precautions, NPO status , Patient's Chart, lab work & pertinent test results  Airway Mallampati: III  TM Distance: >3 FB Neck ROM: Full    Dental no notable dental hx. (+) Dental Advisory Given, Edentulous Lower, Edentulous Upper   Pulmonary sleep apnea , COPD, Current Smoker and Patient abstained from smoking.   Pulmonary exam normal breath sounds clear to auscultation       Cardiovascular hypertension, Normal cardiovascular exam Rhythm:Regular Rate:Normal     Neuro/Psych  PSYCHIATRIC DISORDERS Anxiety Depression Bipolar Disorder Schizophrenia  Schizoaffective disordernegative neurological ROS     GI/Hepatic negative GI ROS,,,Abnormal MRICP, Choledocholithiasis, Dilated Bile duct, Abnormal LFTs, Recurrent Pancreatitis   Endo/Other  negative endocrine ROS    Renal/GU Renal disease     Musculoskeletal negative musculoskeletal ROS (+)    Abdominal   Peds  Hematology negative hematology ROS (+)   Anesthesia Other Findings Day of surgery medications reviewed with the patient.  Reproductive/Obstetrics                             Anesthesia Physical Anesthesia Plan  ASA: 3  Anesthesia Plan: General   Post-op Pain Management: Minimal or no pain anticipated   Induction: Intravenous  PONV Risk Score and Plan: 1 and Treatment may vary due to age or medical condition, Propofol infusion and TIVA  Airway Management Planned: Natural Airway and Nasal Cannula  Additional Equipment: None  Intra-op Plan:   Post-operative Plan: Extubation in OR  Informed Consent: I have reviewed the patients History and Physical, chart, labs and discussed the procedure including the risks, benefits and alternatives for the proposed anesthesia with the patient or authorized representative who has indicated  his/her understanding and acceptance.     Dental advisory given  Plan Discussed with: CRNA  Anesthesia Plan Comments: (Recurrent pancreatitis and pseudocyst for EUS)       Anesthesia Quick Evaluation

## 2022-06-15 NOTE — Consult Note (Signed)
Consult Note  Steve Romero 1971-05-31  [id number].    Requesting MD: Dwyane Dee, MD Chief Complaint/Reason for Consult: Pancreatic Pseudocyst HPI:  Patient is a 51 year old male who underwent laparoscopic cholecystectomy 10/09/20 with Dr. Constance Haw at [facility] for acute cholecystitis. Patient unfortunately developed post-op bile leak and required ERCP for stent placement, after this he developed post ERCP pancreatitis. Since that time has had a complicated course. Recurrent pseudocyst formation. He was most recently admitted in November and underwent drainage at that time, collection has now recurred. During last ERCP on 11/17, pancreatic duct was unable to be cannulated and there was suspicion for pancreatic duct disruption. Similar findings were noted previously by [facility] gastroenterology on a prior ERCP. Patient reports his symptoms of chronic abdominal pain and nausea and vomiting never really resolved and presented to the ED again 12/8. Patient never saw [facility] surgeon that he is aware of and no documentation in chart from any outpatient visits with [facility] surgery. Patient currently reports he is having acute on chronic upper abdominal pain that is worse than baseline. He is able to keep down liquids but has not had a BM in several days.   ROS: Negative other than HPI  Family History  Problem Relation Age of Onset   Diabetes Father    Heart disease Father    Heart disease Maternal Grandmother    Stroke Maternal Grandfather    Colon cancer Neg Hx    Esophageal cancer Neg Hx    Inflammatory bowel disease Neg Hx    Liver disease Neg Hx    Pancreatic cancer Neg Hx    Rectal cancer Neg Hx    Stomach cancer Neg Hx     Past Medical History:  Diagnosis Date   Acute pancreatitis without necrosis or infection, unspecified    AKI (acute kidney injury) ([place]) 05/17/2022   Anxiety 10/2014   Ascites    Bipolar disorder ([place]) age 39   COPD (chronic obstructive pulmonary disease) ([place])  2016   Depression age 73   Enlarged prostate    Hyperlipidemia 2016   Hypertension 2013   Schizoaffective disorder ([place]) 11/13/2014   Sleep apnea    Does not wear c-pap, sleeps in sitting up position per pt   Thyroid disease 11/2014    Past Surgical History:  Procedure Laterality Date   BALLOON DILATION N/A 11/07/2020   Procedure: BALLOON DILATION;  Surgeon: Irving Copas., MD;  Location: [facility] ENDOSCOPY;  Service: Gastroenterology;  Laterality: N/A;   BALLOON DILATION N/A 06/26/2021   Procedure: BALLOON DILATION;  Surgeon: Rush Landmark Telford Nab., MD;  Location: [place];  Service: Gastroenterology;  Laterality: N/A;   BILIARY DILATION  07/24/2021   Procedure: BILIARY DILATION;  Surgeon: Rush Landmark Telford Nab., MD;  Location: [place];  Service: Gastroenterology;;   BILIARY DILATION  05/22/2022   Procedure: BILIARY DILATION;  Surgeon: Irving Copas., MD;  Location: [facility];  Service: Gastroenterology;;   BILIARY STENT PLACEMENT N/A 10/10/2020   Procedure: BILIARY STENT PLACEMENT;  Surgeon: Rogene Houston, MD;  [facility]: [facility];  Service: Endoscopy;  Laterality: N/A;   BILIARY STENT PLACEMENT  11/09/2020   Procedure: BILIARY STENT PLACEMENT;  Surgeon: Rush Landmark Telford Nab., MD;  Location: [place];  Service: Gastroenterology;;   BILIARY STENT PLACEMENT  11/07/2020   Procedure: BILIARY STENT PLACEMENT;  Surgeon: Irving Copas., MD;  Location: [place];  Service: Gastroenterology;;   BILIARY STENT PLACEMENT  11/11/2020   Procedure: BILIARY STENT PLACEMENT;  Surgeon: Irving Copas., MD;  Location: [place];  Service: Gastroenterology;;   BILIARY STENT PLACEMENT  11/14/2020   Procedure: BILIARY STENT PLACEMENT;  Surgeon: Irving Copas., MD;  Location: [place];  Service: Gastroenterology;;   BILIARY STENT PLACEMENT N/A 01/29/2021   Procedure: BILIARY STENT PLACEMENT;  Surgeon: Irving Copas., MD;   Location: [facility];  Service: Gastroenterology;  Laterality: N/A;   BILIARY STENT PLACEMENT  06/26/2021   Procedure: BILIARY STENT PLACEMENT;  Surgeon: Rush Landmark Telford Nab., MD;  Location: [place];  Service: Gastroenterology;;   BILIARY STENT PLACEMENT  07/24/2021   Procedure: BILIARY STENT PLACEMENT;  Surgeon: Irving Copas., MD;  Location: [place];  Service: Gastroenterology;;   BIOPSY  11/07/2020   Procedure: BIOPSY;  Surgeon: Irving Copas., MD;  Location: [place];  Service: Gastroenterology;;   BIOPSY  12/20/2020   Procedure: BIOPSY;  Surgeon: Irving Copas., MD;  Location: [facility];  Service: Gastroenterology;;   BIOPSY  06/26/2021   Procedure: BIOPSY;  Surgeon: Irving Copas., MD;  Location: [place];  Service: Gastroenterology;;   CARPAL TUNNEL RELEASE Left 02/21/2016   Procedure: CARPAL TUNNEL RELEASE;  Surgeon: Carole Civil, MD;  [facility];  Service: Orthopedics;  Laterality: Left;   CHOLECYSTECTOMY N/A 10/09/2020   Procedure: LAPAROSCOPIC CHOLECYSTECTOMY;  Surgeon: Virl Cagey, MD;  [facility];  Service: General;  Laterality: N/A;   CYST ENTEROSTOMY N/A 11/07/2020   Procedure: CYST ENTEROSTOMY;  Surgeon: Irving Copas., MD;  Location: [place];  Service: Gastroenterology;  Laterality: N/A;   CYST GASTROSTOMY  11/11/2020   Procedure: CYST NECROSECTOMY;  Surgeon: Rush Landmark Telford Nab., MD;  Location: [place];  Service: Gastroenterology;;   CYST GASTROSTOMY  11/14/2020   Procedure: CYST NECROSECTOMY;  Surgeon: Irving Copas., MD;  Location: [place];  Service: Gastroenterology;;   CYST REMOVAL HAND     CYSTOSCOPY  06/26/2021   Procedure: CYSTOGASTROSTOMY;  Surgeon: Mansouraty, Telford Nab., MD;  Location: [facility] ENDOSCOPY;  Service: Gastroenterology;;   ENDOSCOPIC RETROGRADE CHOLANGIOPANCREATOGRAPHY (ERCP) WITH PROPOFOL N/A 11/09/2020   Procedure: ENDOSCOPIC  RETROGRADE CHOLANGIOPANCREATOGRAPHY (ERCP) WITH PROPOFOL;  Surgeon: Irving Copas., MD;  Location: [place];  Service: Gastroenterology;  Laterality: N/A;   ENDOSCOPIC RETROGRADE CHOLANGIOPANCREATOGRAPHY (ERCP) WITH PROPOFOL N/A 12/20/2020   Procedure: ENDOSCOPIC RETROGRADE CHOLANGIOPANCREATOGRAPHY (ERCP) WITH PROPOFOL;  Surgeon: Rush Landmark Telford Nab., MD;  Location: [facility];  Service: Gastroenterology;  Laterality: N/A;   ENDOSCOPIC RETROGRADE CHOLANGIOPANCREATOGRAPHY (ERCP) WITH PROPOFOL N/A 01/29/2021   Procedure: ENDOSCOPIC RETROGRADE CHOLANGIOPANCREATOGRAPHY (ERCP) WITH PROPOFOL;  Surgeon: Rush Landmark Telford Nab., MD;  Location: [facility];  Service: Gastroenterology;  Laterality: N/A;   ENDOSCOPIC RETROGRADE CHOLANGIOPANCREATOGRAPHY (ERCP) WITH PROPOFOL N/A 07/24/2021   Procedure: ENDOSCOPIC RETROGRADE CHOLANGIOPANCREATOGRAPHY (ERCP) WITH PROPOFOL;  Surgeon: Rush Landmark Telford Nab., MD;  Location: [place];  Service: Gastroenterology;  Laterality: N/A;   ERCP N/A 10/10/2020   Procedure: ENDOSCOPIC RETROGRADE CHOLANGIOPANCREATOGRAPHY (ERCP);  Surgeon: Rogene Houston, MD;  [facility];  Service: Endoscopy;  Laterality: N/A;   ERCP     ERCP N/A 05/22/2022   Procedure: ENDOSCOPIC RETROGRADE CHOLANGIOPANCREATOGRAPHY (ERCP);  Surgeon: Irving Copas., MD;  Location: [facility];  Service: Gastroenterology;  Laterality: N/A;   ESOPHAGOGASTRODUODENOSCOPY N/A 11/11/2020   Procedure: ESOPHAGOGASTRODUODENOSCOPY (EGD);  Surgeon: Irving Copas., MD;  Location: [place];  Service: Gastroenterology;  Laterality: N/A;   ESOPHAGOGASTRODUODENOSCOPY N/A 11/14/2020   Procedure: ESOPHAGOGASTRODUODENOSCOPY (EGD);  Surgeon: Irving Copas., MD;  Location: [place];  Service: Gastroenterology;  Laterality: N/A;  ESOPHAGOGASTRODUODENOSCOPY (EGD) WITH PROPOFOL N/A 11/09/2020   Procedure: ESOPHAGOGASTRODUODENOSCOPY (EGD) WITH PROPOFOL;  Surgeon:  Rush Landmark Telford Nab., MD;  Location: [place];  Service: Gastroenterology;  Laterality: N/A;   ESOPHAGOGASTRODUODENOSCOPY (EGD) WITH PROPOFOL N/A 11/07/2020   Procedure: ESOPHAGOGASTRODUODENOSCOPY (EGD) WITH PROPOFOL;  Surgeon: Rush Landmark Telford Nab., MD;  Location: [place];  Service: Gastroenterology;  Laterality: N/A;   ESOPHAGOGASTRODUODENOSCOPY (EGD) WITH PROPOFOL N/A 12/20/2020   Procedure: ESOPHAGOGASTRODUODENOSCOPY (EGD) WITH PROPOFOL;  Surgeon: Rush Landmark Telford Nab., MD;  [facility];  Service: Gastroenterology;  Laterality: N/A;   ESOPHAGOGASTRODUODENOSCOPY (EGD) WITH PROPOFOL N/A 06/26/2021   Procedure: ESOPHAGOGASTRODUODENOSCOPY (EGD) WITH PROPOFOL;  Surgeon: Rush Landmark Telford Nab., MD;  Location: [place];  Service: Gastroenterology;  Laterality: N/A;   ESOPHAGOGASTRODUODENOSCOPY (EGD) WITH PROPOFOL N/A 07/24/2021   Procedure: ESOPHAGOGASTRODUODENOSCOPY (EGD) WITH PROPOFOL;  Surgeon: Rush Landmark Telford Nab., MD;  Location: [place];  Service: Gastroenterology;  Laterality: N/A;  AXIOS STENT   ESOPHAGOGASTRODUODENOSCOPY (EGD) WITH PROPOFOL N/A 05/22/2022   Procedure: ESOPHAGOGASTRODUODENOSCOPY (EGD) WITH PROPOFOL;  Surgeon: Rush Landmark Telford Nab., MD;  [facility];  Service: Gastroenterology;  Laterality: N/A;   EUS  11/14/2020   Procedure: UPPER ENDOSCOPIC ULTRASOUND (EUS) LINEAR;  Surgeon: Irving Copas., MD;  Location: [place];  Service: Gastroenterology;;   EUS N/A 06/26/2021   Procedure: UPPER ENDOSCOPIC ULTRASOUND (EUS) RADIAL;  Surgeon: Irving Copas., MD;  Location: [place];  Service: Gastroenterology;  Laterality: N/A;   EUS N/A 05/22/2022   Procedure: UPPER ENDOSCOPIC ULTRASOUND (EUS) LINEAR;  Surgeon: Irving Copas., MD;  Location[facility];  Service: Gastroenterology;  Laterality: N/A;   FINE NEEDLE ASPIRATION  05/22/2022   Procedure: FINE NEEDLE ASPIRATION;  Surgeon: Rush Landmark Telford Nab., MD;  Location: [facility];  Service: Gastroenterology;;   GASTROINTESTINAL STENT REMOVAL  12/20/2020   Procedure: GASTROINTESTINAL STENT REMOVAL;  Surgeon: Irving Copas., MD;  Location: [facility];  Service: Gastroenterology;;  cyst gastrostomy stent and double pig tail stents x2 removed   HERNIA REPAIR Left 2002   groin   INGUINAL HERNIA REPAIR Left 04/05/2017   Procedure: RECURRENT HERNIA REPAIR INGUINAL ADULT WITH MESH;  Surgeon: [name], MD;  Location[facility];  Service: General;  Laterality: Left;   MASS EXCISION Right 04/29/2020   Procedure: EXCISION MASS RIGHT WRIST;  Surgeon: [name], MD;  Location: [place];  Service: Orthopedics;  Laterality: Right;   MASS EXCISION Right 10/09/2020   Procedure: EXCISION MASS, ABDOMINAL WALL, 2CM;  Surgeon: Virl Cagey, MD;  Location: [facility];  Service: General;  Laterality: Right;   PANCREATIC STENT PLACEMENT  06/26/2021   Procedure: AXIOS STENT PLACEMENT;  Surgeon: Irving Copas., MD;  Location: [place];  Service: Gastroenterology;;   REMOVAL OF STONES  12/20/2020   Procedure: REMOVAL OF STONES;  Surgeon: Irving Copas., MD;  Location: [facility];  Service: Gastroenterology;;   REMOVAL OF STONES  07/24/2021   Procedure: REMOVAL OF STONES;  Surgeon: Irving Copas., MD;  Location: [place];  Service: Gastroenterology;;   REMOVAL OF STONES  05/22/2022   Procedure: REMOVAL OF STONES;  Surgeon: Irving Copas., MD;  Location: [facility];  Service: Gastroenterology;;   Joan Mayans N/A 10/10/2020   Procedure: Joan Mayans;  Surgeon: Rogene Houston, MD;  Location: [facility];  Service: Endoscopy;  Laterality: N/A;   SPHINCTEROTOMY  07/24/2021   Procedure: SPHINCTEROTOMY;  Surgeon: Mansouraty, Telford Nab., MD;  Location: [place];  Service: Gastroenterology;;   Bess Kinds CHOLANGIOSCOPY N/A 12/20/2020   Procedure: SPYGLASS CHOLANGIOSCOPY;  Surgeon:  Irving Copas., MD;  Location: [facility];  Service: Gastroenterology;  Laterality: N/A;   SPYGLASS CHOLANGIOSCOPY N/A 07/24/2021   Procedure: SPYGLASS CHOLANGIOSCOPY;  Surgeon: Irving Copas., MD;  Location: [place];  Service: Gastroenterology;  Laterality: N/A;   STENT REMOVAL  11/09/2020   Procedure: STENT REMOVAL;  Surgeon: Irving Copas., MD;  Location: [place];  Service: Gastroenterology;;   Lavell Islam REMOVAL  11/11/2020   Procedure: STENT REMOVAL;  Surgeon: Irving Copas., MD;  Location: [place];  Service: Gastroenterology;;   Lavell Islam REMOVAL  11/14/2020   Procedure: STENT REMOVAL;  Surgeon: Irving Copas., MD;  Location: [place];  Service: Gastroenterology;;   Lavell Islam REMOVAL  12/20/2020   Procedure: STENT REMOVAL;  Surgeon: Irving Copas., MD;  Location: [facility];  Service: Gastroenterology;;  biliary x2   STENT REMOVAL  07/24/2021   Procedure: AXIOS STENT REMOVAL;  Surgeon: Irving Copas., MD;  Location: [place];  Service: Gastroenterology;;   UPPER ESOPHAGEAL ENDOSCOPIC ULTRASOUND (EUS) N/A 11/07/2020   Procedure: UPPER ESOPHAGEAL ENDOSCOPIC ULTRASOUND (EUS);  Surgeon: Irving Copas., MD;  Location: [place];  Service: Gastroenterology;  Laterality: N/A;   UPPER GASTROINTESTINAL ENDOSCOPY     WOUND DEBRIDEMENT  11/09/2020   Procedure: CYST NECROSECTOMY;  Surgeon: Rush Landmark Telford Nab., MD;  Location: [place];  Service: Gastroenterology;;    Social History:  reports that he has been smoking cigarettes. He has a 6.50 pack-year smoking history. He has never used smokeless tobacco. He reports that he does not drink alcohol and does not use drugs.  Allergies:  Allergies  Allergen Reactions   Desipramine Nausea Only and Rash    Medications Prior to Admission  Medication Sig Dispense Refill   lipase/protease/amylase (CREON) 36000 UNITS CPEP capsule Take 2 capsules (72,000 Units  total) by mouth 3 (three) times daily with meals. 270 capsule 1   ondansetron (ZOFRAN-ODT) 4 MG disintegrating tablet Take 1 tablet (4 mg total) by mouth every 8 (eight) hours as needed for nausea or vomiting. 60 tablet 1   pantoprazole (PROTONIX) 40 MG tablet Take 1 tablet (40 mg total) by mouth daily. 30 tablet 1    Blood pressure 127/84, pulse 81, temperature 97.8 F (36.6 C), temperature source Oral, resp. rate 18, height '5\' 10"'$  (1.778 m), weight 89 kg, SpO2 98 %. Physical Exam:  General: pleasant, WD, WN male who is laying in bed in NAD HEENT: sclera anicteric  Heart: regular, rate, and rhythm.   Lungs:  Respiratory effort nonlabored Abd: soft, ttp in upper abdomen, surgical scars from laparoscopic cholecystectomy, small reducible umbilical hernia, mild distention  MS: all 4 extremities are symmetrical with no cyanosis, clubbing, or edema. Skin: warm and dry with no masses, lesions, or rashes Psych: A&Ox3 with an appropriate affect.   Results for orders placed or performed during the hospital encounter of 06/12/22 (from the past 48 hour(s))  CBC     Status: Abnormal   Collection Time: 06/14/22  4:44 AM  Result Value Ref Range   WBC 10.6 (H) 4.0 - 10.5 K/uL   RBC 4.98 4.22 - 5.81 MIL/uL   Hemoglobin 15.3 13.0 - 17.0 g/dL   HCT 45.3 39.0 - 52.0 %   MCV 91.0 80.0 - 100.0 fL   MCH 30.7 26.0 - 34.0 pg   MCHC 33.8 30.0 - 36.0 g/dL   RDW 12.2 11.5 - 15.5 %   Platelets 314 150 - 400 K/uL   nRBC 0.0 0.0 - 0.2 %  Comment: Performed at [facility], [place] [address], [place], [place] [address]  Basic metabolic panel     Status: Abnormal   Collection Time: 06/14/22  4:44 AM  Result Value Ref Range   Sodium 136 135 - 145 mmol/L   Potassium 4.2 3.5 - 5.1 mmol/L   Chloride 96 (L) 98 - 111 mmol/L   CO2 29 22 - 32 mmol/L   Glucose, Bld 156 (H) 70 - 99 mg/dL    Comment: Glucose reference range applies only to samples taken after fasting for at least 8 hours.   BUN 22  (H) 6 - 20 mg/dL   Creatinine, Ser 1.03 0.61 - 1.24 mg/dL   Calcium 8.8 (L) 8.9 - 10.3 mg/dL   GFR, Estimated >60 >60 mL/min    Comment: (NOTE) Calculated using the CKD-EPI Creatinine Equation (2021)    Anion gap 11 5 - 15    Comment: Performed at [facility], [place] [address], [place], [place] [address]  CBC     Status: None   Collection Time: 06/15/22  4:22 AM  Result Value Ref Range   WBC 8.7 4.0 - 10.5 K/uL   RBC 4.77 4.22 - 5.81 MIL/uL   Hemoglobin 14.7 13.0 - 17.0 g/dL   HCT 43.3 39.0 - 52.0 %   MCV 90.8 80.0 - 100.0 fL   MCH 30.8 26.0 - 34.0 pg   MCHC 33.9 30.0 - 36.0 g/dL   RDW 11.9 11.5 - 15.5 %   Platelets 283 150 - 400 K/uL   nRBC 0.0 0.0 - 0.2 %    Comment: Performed at [facility], [place] [address] [place], [place] [address]  Comprehensive metabolic panel     Status: Abnormal   Collection Time: 06/15/22  4:22 AM  Result Value Ref Range   Sodium 137 135 - 145 mmol/L   Potassium 4.2 3.5 - 5.1 mmol/L   Chloride 99 98 - 111 mmol/L   CO2 31 22 - 32 mmol/L   Glucose, Bld 128 (H) 70 - 99 mg/dL    Comment: Glucose reference range applies only to samples taken after fasting for at least 8 hours.   BUN 12 6 - 20 mg/dL   Creatinine, Ser 0.87 0.61 - 1.24 mg/dL   Calcium 8.9 8.9 - 10.3 mg/dL   Total Protein 6.3 (L) 6.5 - 8.1 g/dL   Albumin 3.5 3.5 - 5.0 g/dL   AST 21 15 - 41 U/L   ALT 67 (H) 0 - 44 U/L   Alkaline Phosphatase 436 (H) 38 - 126 U/L   Total Bilirubin 1.2 0.3 - 1.2 mg/dL   GFR, Estimated >60 >60 mL/min    Comment: (NOTE) Calculated using the CKD-EPI Creatinine Equation (2021)    Anion gap 7 5 - 15    Comment: Performed at [facility], [place] [address] [place], [place] [address]   No results found.    Assessment/Plan Complicated necrotizing pancreatitis with pancreatic pseudocyst - s/p laparoscopic cholecystectomy 10/09/20 Dr. Curlene Labrum, post-op bile leak, s/p ERCP and then developed post ERCP  pancreatitis - GI following and to discuss with Dr. Rush Landmark - last ERCP 11/17 with concern for pancreatic ductal disruption per Dr. Donneta Romberg procedure note - unsure what surgical options we have to offer but will discuss further with attending MD and likely [facility] surgeon as well. If GI unable to attempt endoscopic management, may need to consider transfer to tertiary center but once again will need to  discuss with attending MD.   FEN: diet per GI VTE: LMWH ID: no current abx   I reviewed Consultant GI notes, hospitalist notes, last 24 h vitals and pain scores, last 48 h intake and output, last 24 h labs and trends, and last 24 h imaging results.   Norm Parcel, [facility] Surgery 06/15/2022, 11:02 AM Please see Amion for pager number during day hours 7:00am-4:30pm

## 2022-06-15 NOTE — Progress Notes (Signed)
Progress Note    Steve Romero   WJX:[id number]  DOB: May 25, 1971  DOA: 06/12/2022     2 PCP: [name], DO  Initial CC: abdominal pain  Hospital Course: Steve Romero is a 51 y.o. male with medical history significant of anxiety, depression, bipolar disorder, schizoaffective disorder, smoking, COPD, hypertension, hyperlipidemia, BPH, GERD, cholecystectomy in 2130 complicated by bile leak then severe post ERCP necrotizing pancreatitis and development of pseudocyst requiring cystogastrostomy and biliary stent due to obstruction.  Recently admitted 11/12-11/19 for acute pancreatitis with pseudocyst formation and AKI.  MRCP revealed recurrent CBD stone.  Patient underwent ERCP on 11/17 revealing an edematous major papilla, slightly narrowed prior biliary sphincterotomy with fluoroscopic examination suspicious for sludge with distal narrowing noted in the duct.  A balloon sphincteroplasty was performed with sludge in the biliary tree being swept out successfully.  There was an additional irregularity found in the ventral pancreatic duct in the head of the pancreas but evaluation of this was unsuccessful. Biopsy of pseudocyst came back benign.  GI had recommended repeating CT pancreas protocol in 3 to 4 weeks.   Patient had a hospital follow-up visit with his PCP and endorsed worsening abdominal pain, nausea, and vomiting.  Sent to the ED for further evaluation, started on IVF and abx with GI consult.   Interval History:  Patient seen this morning sitting up in recliner bedside.  Seems to have chronic abdominal pain typically 5-6/10.  He was having worsening pain on admission and after receiving pain medication this morning he states his pain was back down to a 6/10.  Assessment and Plan:  Necrotizing gallstone pancreatitis Pseudocyst formation - s/p CCY in 8657 complicated by bile leak then severe post ERCP 05/22/22 necrotizing pancreatitis and development of pseudocyst requiring  cystogastrostomy and biliary stent due to obstruction. - now with acute on chronic abdominal pain - Patient underwent ERCP on 11/17 revealing an edematous major papilla, slightly narrowed prior biliary sphincterotomy with fluoroscopic examination suspicious for sludge with distal narrowing noted in the duct.  A balloon sphincteroplasty was performed with sludge in the biliary tree being swept out successfully.  There was an additional irregularity found in the ventral pancreatic duct in the head of the pancreas but evaluation of this was unsuccessful.  Biopsy of pseudocyst came back benign.  -Initially started on antibiotics until infection ruled out.  He seems to remain nontoxic-appearing with afebrile, down trended leukocytosis, and blood cultures remain negative - will d/c abx today, 06/15/22 - GI following; followup further plans - continue pain control    AKI - resolved  - baseline creatinine ~ 1 - patient presents with increase in creat >0.3 mg/dL above baseline, creat increase >1.5x baseline presumed to have occurred within past 7 days PTA - s/p IVF   Hypovolemic hyponatremia - resolved Hypokalemia - s/p IVF and repletion   COPD Documented in the chart and history of smoking but patient is not on inhalers at home.  No wheezing or respiratory distress. -Continue to monitor   Hypertension Not on any medications.  Currently normotensive. -Continue to monitor   Old records reviewed in assessment of this patient  Antimicrobials: Cefepime 12/9 >> 12/11 Flagyl 12/9 >> 12/11  DVT prophylaxis:  SCDs Start: 06/13/22 0119   Code Status:   Code Status: Full Code  Mobility Assessment (last 72 hours)     Mobility Assessment     Row Name 06/14/22 2024 06/13/22 1915 06/13/22 1500       Does patient have an  order for bedrest or is patient medically unstable No - Continue assessment No - Continue assessment No - Continue assessment     What is the highest level of mobility based on  the progressive mobility assessment? Level 6 (Walks independently in room and hall) - Balance while walking in room without assist - Complete Level 6 (Walks independently in room and hall) - Balance while walking in room without assist - Complete Level 6 (Walks independently in room and hall) - Balance while walking in room without assist - Complete              Barriers to discharge:  Disposition Plan:  Home Status is: Inpt  Objective: Blood pressure 127/84, pulse 81, temperature 97.8 F (36.6 C), temperature source Oral, resp. rate 18, height '5\' 10"'$  (1.778 m), weight 89 kg, SpO2 98 %.  Examination:  Physical Exam Constitutional:      General: He is not in acute distress.    Appearance: Normal appearance.  HENT:     Head: Normocephalic and atraumatic.     Mouth/Throat:     Mouth: Mucous membranes are moist.  Eyes:     Extraocular Movements: Extraocular movements intact.  Cardiovascular:     Rate and Rhythm: Normal rate and regular rhythm.  Pulmonary:     Effort: Pulmonary effort is normal. No respiratory distress.     Breath sounds: Normal breath sounds.  Abdominal:     General: Bowel sounds are normal. There is distension.     Palpations: Abdomen is soft.     Tenderness: There is abdominal tenderness.  Musculoskeletal:        General: Normal range of motion.     Cervical back: Normal range of motion and neck supple.  Skin:    General: Skin is warm and dry.  Neurological:     General: No focal deficit present.     Mental Status: He is alert and oriented to person, place, and time.  Psychiatric:        Behavior: Behavior normal.      Consultants:  GI  Procedures:    Data Reviewed: Results for orders placed or performed during the hospital encounter of 06/12/22 (from the past 24 hour(s))  CBC     Status: None   Collection Time: 06/15/22  4:22 AM  Result Value Ref Range   WBC 8.7 4.0 - 10.5 K/uL   RBC 4.77 4.22 - 5.81 MIL/uL   Hemoglobin 14.7 13.0 - 17.0  g/dL   HCT 43.3 39.0 - 52.0 %   MCV 90.8 80.0 - 100.0 fL   MCH 30.8 26.0 - 34.0 pg   MCHC 33.9 30.0 - 36.0 g/dL   RDW 11.9 11.5 - 15.5 %   Platelets 283 150 - 400 K/uL   nRBC 0.0 0.0 - 0.2 %  Comprehensive metabolic panel     Status: Abnormal   Collection Time: 06/15/22  4:22 AM  Result Value Ref Range   Sodium 137 135 - 145 mmol/L   Potassium 4.2 3.5 - 5.1 mmol/L   Chloride 99 98 - 111 mmol/L   CO2 31 22 - 32 mmol/L   Glucose, Bld 128 (H) 70 - 99 mg/dL   BUN 12 6 - 20 mg/dL   Creatinine, Ser 0.87 0.61 - 1.24 mg/dL   Calcium 8.9 8.9 - 10.3 mg/dL   Total Protein 6.3 (L) 6.5 - 8.1 g/dL   Albumin 3.5 3.5 - 5.0 g/dL   AST 21 15 - 41 U/L  ALT 67 (H) 0 - 44 U/L   Alkaline Phosphatase 436 (H) 38 - 126 U/L   Total Bilirubin 1.2 0.3 - 1.2 mg/dL   GFR, Estimated >60 >60 mL/min   Anion gap 7 5 - 15    I have Reviewed nursing notes, Vitals, and Lab results since pt's last encounter. Pertinent lab results : see above I have ordered test including BMP, CBC, Mg I have reviewed the last note from staff over past 24 hours I have discussed pt's care plan and test results with nursing staff, case manager  Time spent: Greater than 50% of the 55 minute visit was spent in counseling/coordination of care for the patient as laid out in the A&P.    LOS: 2 days   Dwyane Dee, MD [facility] 06/15/2022, 1:16 PM

## 2022-06-16 ENCOUNTER — Encounter (HOSPITAL_COMMUNITY): Payer: Self-pay | Admitting: Internal Medicine

## 2022-06-16 ENCOUNTER — Inpatient Hospital Stay (HOSPITAL_COMMUNITY): Payer: Medicaid Other | Admitting: Certified Registered Nurse Anesthetist

## 2022-06-16 ENCOUNTER — Encounter (HOSPITAL_COMMUNITY)
Admission: EM | Disposition: A | Discharge status: Home / Self Care | Payer: Self-pay | Source: Home / Self Care | Attending: Internal Medicine

## 2022-06-16 DIAGNOSIS — K208 Other esophagitis without bleeding: Secondary | ICD-10-CM

## 2022-06-16 DIAGNOSIS — I10 Essential (primary) hypertension: Secondary | ICD-10-CM

## 2022-06-16 DIAGNOSIS — K3189 Other diseases of stomach and duodenum: Secondary | ICD-10-CM

## 2022-06-16 DIAGNOSIS — F17210 Nicotine dependence, cigarettes, uncomplicated: Secondary | ICD-10-CM

## 2022-06-16 DIAGNOSIS — K859 Acute pancreatitis without necrosis or infection, unspecified: Secondary | ICD-10-CM

## 2022-06-16 DIAGNOSIS — K2289 Other specified disease of esophagus: Secondary | ICD-10-CM

## 2022-06-16 DIAGNOSIS — K8591 Acute pancreatitis with uninfected necrosis, unspecified: Secondary | ICD-10-CM

## 2022-06-16 DIAGNOSIS — K869 Disease of pancreas, unspecified: Secondary | ICD-10-CM

## 2022-06-16 DIAGNOSIS — J449 Chronic obstructive pulmonary disease, unspecified: Secondary | ICD-10-CM

## 2022-06-16 HISTORY — PX: BILIARY DILATION: SHX6850

## 2022-06-16 HISTORY — PX: EUS: SHX5427

## 2022-06-16 HISTORY — PX: CYST GASTROSTOMY: SHX6862

## 2022-06-16 HISTORY — PX: ESOPHAGOGASTRODUODENOSCOPY (EGD) WITH PROPOFOL: SHX5813

## 2022-06-16 HISTORY — PX: PANCREATIC STENT PLACEMENT: SHX5539

## 2022-06-16 SURGERY — UPPER ENDOSCOPIC ULTRASOUND (EUS) LINEAR
Anesthesia: General

## 2022-06-16 MED ORDER — DEXAMETHASONE SODIUM PHOSPHATE 10 MG/ML IJ SOLN
INTRAMUSCULAR | Status: DC | PRN
  Administered 2022-06-16: 8 mg via INTRAVENOUS

## 2022-06-16 MED ORDER — FENTANYL CITRATE (PF) 100 MCG/2ML IJ SOLN
INTRAMUSCULAR | Status: AC
  Filled 2022-06-16: qty 2

## 2022-06-16 MED ORDER — LACTATED RINGERS IV SOLN: INTRAVENOUS | Status: DC | PRN

## 2022-06-16 MED ORDER — PHENYLEPHRINE 80 MCG/ML (10ML) SYRINGE FOR IV PUSH (FOR BLOOD PRESSURE SUPPORT)
PREFILLED_SYRINGE | INTRAVENOUS | Status: DC | PRN
  Administered 2022-06-16: 80 ug via INTRAVENOUS
  Administered 2022-06-16 (×2): 160 ug via INTRAVENOUS
  Administered 2022-06-16: 80 ug via INTRAVENOUS
  Administered 2022-06-16: 160 ug via INTRAVENOUS
  Administered 2022-06-16: 80 ug via INTRAVENOUS

## 2022-06-16 MED ORDER — CYCLOBENZAPRINE HCL 5 MG PO TABS
5.0000 mg | ORAL_TABLET | Freq: Three times a day (TID) | ORAL | Status: DC | PRN
  Administered 2022-06-16 – 2022-06-18 (×2): 5 mg via ORAL
  Filled 2022-06-16 (×2): qty 1

## 2022-06-16 MED ORDER — ROCURONIUM BROMIDE 10 MG/ML (PF) SYRINGE
PREFILLED_SYRINGE | INTRAVENOUS | Status: DC | PRN
  Administered 2022-06-16: 70 mg via INTRAVENOUS

## 2022-06-16 MED ORDER — CIPROFLOXACIN HCL 500 MG PO TABS
500.0000 mg | ORAL_TABLET | Freq: Two times a day (BID) | ORAL | Status: DC
  Administered 2022-06-16 – 2022-06-18 (×4): 500 mg via ORAL
  Filled 2022-06-16 (×4): qty 1

## 2022-06-16 MED ORDER — PHENYLEPHRINE HCL-NACL 20-0.9 MG/250ML-% IV SOLN
INTRAVENOUS | Status: DC | PRN
  Administered 2022-06-16: 30 ug/min via INTRAVENOUS

## 2022-06-16 MED ORDER — EPHEDRINE SULFATE (PRESSORS) 50 MG/ML IJ SOLN
INTRAMUSCULAR | Status: DC | PRN
  Administered 2022-06-16 (×2): 5 mg via INTRAVENOUS

## 2022-06-16 MED ORDER — PROPOFOL 10 MG/ML IV BOLUS
INTRAVENOUS | Status: AC
  Filled 2022-06-16: qty 20

## 2022-06-16 MED ORDER — FENTANYL CITRATE (PF) 100 MCG/2ML IJ SOLN
INTRAMUSCULAR | Status: DC | PRN
  Administered 2022-06-16 (×2): 50 ug via INTRAVENOUS

## 2022-06-16 MED ORDER — CIPROFLOXACIN IN D5W 400 MG/200ML IV SOLN
INTRAVENOUS | Status: DC | PRN
  Administered 2022-06-16: 400 mg via INTRAVENOUS

## 2022-06-16 MED ORDER — CIPROFLOXACIN IN D5W 400 MG/200ML IV SOLN
INTRAVENOUS | Status: AC
  Filled 2022-06-16: qty 200

## 2022-06-16 MED ORDER — LIDOCAINE 2% (20 MG/ML) 5 ML SYRINGE
INTRAMUSCULAR | Status: DC | PRN
  Administered 2022-06-16: 100 mg via INTRAVENOUS

## 2022-06-16 MED ORDER — ONDANSETRON HCL 4 MG/2ML IJ SOLN
INTRAMUSCULAR | Status: DC | PRN
  Administered 2022-06-16: 4 mg via INTRAVENOUS

## 2022-06-16 MED ORDER — PROPOFOL 10 MG/ML IV BOLUS
INTRAVENOUS | Status: DC | PRN
  Administered 2022-06-16: 200 mg via INTRAVENOUS

## 2022-06-16 MED ORDER — SUGAMMADEX SODIUM 200 MG/2ML IV SOLN
INTRAVENOUS | Status: DC | PRN
  Administered 2022-06-16: 200 mg via INTRAVENOUS

## 2022-06-16 NOTE — Op Note (Signed)
[facility] Patient Name: Steve Romero Procedure Date: 06/16/2022 MRN: [id number] Attending MD: Justice Britain , MD, [id number] Date of Birth: March 02, 1971 CSN: [id number] Age: 51 Admit Type: Inpatient Procedure:                Upper EUS Indications:              Pancreatic cyst on CT scan, Acute recurrent                            pancreatitis Providers:                Justice Britain, MD, Daine Gravel, RN, Burtis Junes, RN, Cherylynn Ridges, Technician Referring MD:              Medicines:                General Anesthesia, Cipro 536 mg IV Complications:            No immediate complications. Estimated Blood Loss:     Estimated blood loss was minimal. Procedure:                Pre-Anesthesia Assessment:                           - Prior to the procedure, a History and Physical                            was performed, and patient medications and                            allergies were reviewed. The patient's tolerance of                            previous anesthesia was also reviewed. The risks                            and benefits of the procedure and the sedation                            options and risks were discussed with the patient.                            All questions were answered, and informed consent                            was obtained. Prior Anticoagulants: The patient has                            taken no anticoagulant or antiplatelet agents. ASA                            Grade Assessment: III - A patient with severe  systemic disease. After reviewing the risks and                            benefits, the patient was deemed in satisfactory                            condition to undergo the procedure.                           After obtaining informed consent, the endoscope was                            passed under direct vision. Throughout the                             procedure, the patient's blood pressure, pulse, and                            oxygen saturations were monitored continuously. The                            [id number] ([id number]) Therapeutic endoscope was                            introduced through the mouth, and advanced to the                            second part of duodenum. The [id number] ([id number])                            Olympus linear ultrasound scope was introduced                            through the mouth, and advanced to the stomach for                            ultrasound examination. The upper EUS was                            accomplished without difficulty. The patient                            tolerated the procedure. Scope In: Scope Out: Findings:      ENDOSCOPIC FINDING: :      LA Grade C (one or more mucosal breaks continuous between tops of 2 or       more mucosal folds, less than 75% circumference) esophagitis with no       bleeding was found in the entire esophagus.      The Z-line was irregular and was found 41 cm from the incisors.      Extrinsic compression on the stomach was found in the gastric antrum.      Patchy mildly erythematous mucosa without bleeding was found in the       entire examined stomach.      Only after the EUS portion  of the procedure was complete, was the       duodenum able to be intubated. No gross lesions were noted in the       duodenal bulb, in the first portion of the duodenum and in the second       portion of the duodenum.      ENDOSONOGRAPHIC FINDING: :      An anechoic lesion suggestive of a cyst was identified in the genu of       the pancreas and pancreatic body. It communicates with the pancreatic       duct. The lesion measured 92 mm by 90 mm in maximal cross-sectional       diameter. There was a single compartment thinly septated. The outer wall       of the lesion was thick. There was no associated mass. There was no       internal debris within the fluid-filled  cavity. The decision was made to       create a cystogastrostomy using the AXIOS stent system. Once an       appropriate position in the stomach was identified, the common wall       between the stomach and the cyst was interrogated utilizing color       Doppler imaging to identify interposed vessels. The AXIOS stent and       electrocautery device were introduced through the working channel. The       AXIOS catheter was advanced to the common wall between the stomach and       the cyst. Current was applied to the cautery tip and then the catheter       was advanced into the cyst. A 15 x 10 mm AXIOS stent was placed with the       flanges in close approximation to the walls of the cyst and the stomach       through the cystogastrostomy. The stent was successfully placed. A TTS       dilator was passed through the scope. Dilation of the tract with a       04-16-11 mm balloon dilator was performed. A 5 cm 10 Fr double pigtail       stent was placed into the pseudocyst through the AXIOS cystogastrostomy       using a stent introducer set. 500 mL of fluid was aspirated (this was       sent for fluid culture).      Pancreatic parenchymal abnormalities were noted in the pancreatic body       and pancreatic tail. These consisted of atrophy.      The pancreatic duct had a dilated endosonographic appearance in the body       of the pancreas and tail of the pancreas and visualized.      Endosonographic imaging in the visualized portion of the liver showed no       mass.      The celiac region was visualized. Impression:               EGD Impression:                           - LA Grade C erosive esophagitis with no bleeding.                           - Z-line irregular, 41 cm from the  incisors.                           - Extrinsic compression in the gastric antrum.                           - Erythematous mucosa in the stomach.                           - No gross lesions in the duodenal bulb, in  the                            first portion of the duodenum and in the second                            portion of the duodenum (only seen after EUS                            completed).                           EUS Impression:                           - A cystic lesion was seen in the genu of the                            pancreas and pancreatic body. Tissue has not been                            obtained. However, the endosonographic appearance                            is consistent with a pancreatic pseudocyst. AXIOS                            cystgastrostomy created. Dilated tract. Aspiration                            of 500 mL of fluid.                           - Pancreatic parenchymal abnormalities consisting                            of atrophy were noted in the pancreatic body and                            pancreatic tail.                           - The pancreatic duct had a dilated endosonographic                            appearance in the body of the pancreas and tail of  the pancreas.                           - Cystogastrostomy was performed. Moderate Sedation:      Not Applicable - Patient had care per Anesthesia. Recommendation:           - The patient will be observed post-procedure,                            until all discharge criteria are met.                           - Return patient to hospital ward for ongoing care.                           - Observe patient's clinical course.                           - Continue Ciprofloxacin 500 mg twice daily for                            3-days.                           - Follow up aspiration fluid culture.                           - Observe patient's clinical course.                           - Appreciate Hepatobiliary surgery with discussions                            about next steps in evaluation based on this                            recurrent nature of this cyst and the  inability to                            traverse the distal pancreatic duct. Concern for                            ductal disruption remains.                           - Repeat CT-Abdomen in 3-4 weeks.                           - Tentative EGD with stent pull in 6-weeks.                           - The findings and recommendations were discussed                            with the patient.                           -  The findings and recommendations were discussed                            with the referring physician. Procedure Code(s):        --- Professional ---                           [phone], Esophagogastroduodenoscopy, flexible,                            transoral; with transmural drainage of pseudocyst                            (includes placement of transmural drainage                            catheter[s]/stent[s], when performed, and                            endoscopic ultrasound, when performed)                           43237, Esophagogastroduodenoscopy, flexible,                            transoral; with endoscopic ultrasound examination                            limited to the esophagus, stomach or duodenum, and                            adjacent structures                           43245, Esophagogastroduodenoscopy, flexible,                            transoral; with dilation of gastric/duodenal                            stricture(s) (eg, balloon, bougie) Diagnosis Code(s):        --- Professional ---                           K20.80, Other esophagitis without bleeding                           K22.89, Other specified disease of esophagus                           K31.89, Other diseases of stomach and duodenum                           K86.2, Cyst of pancreas                           K86.9, Disease of pancreas, unspecified  K85.90, Acute pancreatitis without necrosis or                            infection, unspecified                            R93.3, Abnormal findings on diagnostic imaging of                            other parts of digestive tract CPT copyright 2022 [employer]. All rights reserved. The codes documented in this report are preliminary and upon coder review may  be revised to meet current compliance requirements. Justice Britain, MD 06/16/2022 3:55:16 PM Number of Addenda: 0

## 2022-06-16 NOTE — Progress Notes (Signed)
Progress Note    Michall Noffke   [id number]  DOB: 01/12/1971  DOA: 06/12/2022     3 PCP: [name], DO  Initial CC: abdominal pain  Hospital Course: Delbert Darley is a 51 y.o. male with medical history significant of anxiety, depression, bipolar disorder, schizoaffective disorder, smoking, COPD, hypertension, hyperlipidemia, BPH, GERD, cholecystectomy in 4825 complicated by bile leak then severe post ERCP necrotizing pancreatitis and development of pseudocyst requiring cystogastrostomy and biliary stent due to obstruction.  Recently admitted 11/12-11/19 for acute pancreatitis with pseudocyst formation and AKI.  MRCP revealed recurrent CBD stone.  Patient underwent ERCP on 11/17 revealing an edematous major papilla, slightly narrowed prior biliary sphincterotomy with fluoroscopic examination suspicious for sludge with distal narrowing noted in the duct.  A balloon sphincteroplasty was performed with sludge in the biliary tree being swept out successfully.  There was an additional irregularity found in the ventral pancreatic duct in the head of the pancreas but evaluation of this was unsuccessful. Biopsy of pseudocyst came back benign.  GI had recommended repeating CT pancreas protocol in 3 to 4 weeks.   Patient had a hospital follow-up visit with his PCP and endorsed worsening abdominal pain, nausea, and vomiting.  Sent to the ED for further evaluation, started on IVF and abx with GI consult.   Interval History:  No events overnight.  Pain relatively controlled. Cystogastrostomy performed today with GI  Assessment and Plan:  Necrotizing gallstone pancreatitis Pseudocyst formation - s/p CCY in 0037 complicated by bile leak then severe post ERCP 05/22/22 necrotizing pancreatitis and development of pseudocyst requiring cystogastrostomy and biliary stent due to obstruction. - now with acute on chronic abdominal pain - Patient underwent ERCP on 11/17 revealing an edematous major  papilla, slightly narrowed prior biliary sphincterotomy with fluoroscopic examination suspicious for sludge with distal narrowing noted in the duct.  A balloon sphincteroplasty was performed with sludge in the biliary tree being swept out successfully.  There was an additional irregularity found in the ventral pancreatic duct in the head of the pancreas but evaluation of this was unsuccessful.  Biopsy of pseudocyst came back benign.  -Initially started on antibiotics until infection ruled out.  He seems to remain nontoxic-appearing with afebrile, down trended leukocytosis, and blood cultures remain negative - will d/c abx today, 06/15/22 -Cystogastrostomy performed 06/16/2022 with GI - Per GI Rec's: Continue ciprofloxacin for 3 days per GI recommendations. Follow-up fluid aspiration cultures. CT abdomen repeat in 3 to 4 weeks. EGD repeat with stent pull in 6 weeks   AKI - resolved  - baseline creatinine ~ 1 - patient presents with increase in creat >0.3 mg/dL above baseline, creat increase >1.5x baseline presumed to have occurred within past 7 days PTA - s/p IVF   Hypovolemic hyponatremia - resolved Hypokalemia - s/p IVF and repletion   COPD Documented in the chart and history of smoking but patient is not on inhalers at home.  No wheezing or respiratory distress. -Continue to monitor   Hypertension Not on any medications.  Currently normotensive. -Continue to monitor   Old records reviewed in assessment of this patient  Antimicrobials: Cefepime 12/9 >> 12/11 Flagyl 12/9 >> 12/11  DVT prophylaxis:  SCDs Start: 06/13/22 0119   Code Status:   Code Status: Full Code  Mobility Assessment (last 72 hours)     Mobility Assessment     Row Name 06/14/22 2024 06/13/22 1915         Does patient have an order for bedrest or is  patient medically unstable No - Continue assessment No - Continue assessment      What is the highest level of mobility based on the progressive mobility  assessment? Level 6 (Walks independently in room and hall) - Balance while walking in room without assist - Complete Level 6 (Walks independently in room and hall) - Balance while walking in room without assist - Complete               Barriers to discharge:  Disposition Plan:  Home Status is: Inpt  Objective: Blood pressure 122/74, pulse 70, temperature 97.8 F (36.6 C), temperature source Temporal, resp. rate 13, height '5\' 10"'$  (1.778 m), weight 89.4 kg, SpO2 96 %.  Examination:  Physical Exam Constitutional:      General: He is not in acute distress.    Appearance: Normal appearance.  HENT:     Head: Normocephalic and atraumatic.     Mouth/Throat:     Mouth: Mucous membranes are moist.  Eyes:     Extraocular Movements: Extraocular movements intact.  Cardiovascular:     Rate and Rhythm: Normal rate and regular rhythm.  Pulmonary:     Effort: Pulmonary effort is normal. No respiratory distress.     Breath sounds: Normal breath sounds.  Abdominal:     General: Bowel sounds are normal. There is distension.     Palpations: Abdomen is soft.     Tenderness: There is abdominal tenderness.  Musculoskeletal:        General: Normal range of motion.     Cervical back: Normal range of motion and neck supple.  Skin:    General: Skin is warm and dry.  Neurological:     General: No focal deficit present.     Mental Status: He is alert and oriented to person, place, and time.  Psychiatric:        Behavior: Behavior normal.      Consultants:  GI  Procedures:  06/16/2022: Cystogastrostomy  Data Reviewed: No results found for this or any previous visit (from the past 24 hour(s)).   I have Reviewed nursing notes, Vitals, and Lab results since pt's last encounter. Pertinent lab results : see above I have ordered test including BMP, CBC, Mg I have reviewed the last note from staff over past 24 hours I have discussed pt's care plan and test results with nursing staff, case  manager  Time spent: Greater than 50% of the 55 minute visit was spent in counseling/coordination of care for the patient as laid out in the A&P.    LOS: 3 days   Dwyane Dee, MD [facility] 06/16/2022, 4:48 PM

## 2022-06-16 NOTE — Transfer of Care (Signed)
Immediate Anesthesia Transfer of Care Note  Patient: Steve Romero  Procedure(s) Performed: UPPER ENDOSCOPIC ULTRASOUND (EUS) LINEAR PANCREATIC STENT PLACEMENT BILIARY DILATION  Patient Location: PACU and Endoscopy Unit  Anesthesia Type:General  Level of Consciousness: awake, drowsy, and patient cooperative  Airway & Oxygen Therapy: Patient Spontanous Breathing and Patient connected to face mask oxygen  Post-op Assessment: Report given to RN and Post -op Vital signs reviewed and stable  Post vital signs: Reviewed and stable  Last Vitals:  Vitals Value Taken Time  BP 135/72   Temp    Pulse 65 06/16/22 1515  Resp 11 06/16/22 1515  SpO2 100 % 06/16/22 1515  Vitals shown include unvalidated device data.  Last Pain:  Vitals:   06/16/22 1312  TempSrc: Temporal  PainSc: 5       Patients Stated Pain Goal: 3 (31/54/00 8676)  Complications: No notable events documented.

## 2022-06-16 NOTE — Progress Notes (Addendum)
Patient ID: Steve Romero, male   DOB: July 29, 1970, 51 y.o.   MRN: [id number]    Progress Note   Subjective   Day # 4 CC; history of complicated necrotizing gallstone pancreatitis, recurrent pseudocyst formation, recent admission with new pancreatic pseudocyst presents with increased abdominal pain and finding of significantly enlarged pseudocyst  New labs today  He appears more uncomfortable today, says his upper abdominal pain is now radiating into his back feels best if he stretches out his upper abdomen  Remains afebrile   Objective   Vital signs in last 24 hours: Temp:  [97.7 F (36.5 C)-98.1 F (36.7 C)] 98 F (36.7 C) (12/12 0430) Pulse Rate:  [65-70] 65 (12/12 0430) Resp:  [19-20] 20 (12/12 0430) BP: (138-160)/(95-103) 150/96 (12/12 0430) SpO2:  [99 %] 99 % (12/12 0430) Last BM Date : 06/15/22 General:   Older white male in NAD Heart:  Regular rate and rhythm; no murmurs Lungs: Respirations even and unlabored, lungs CTA bilaterally Abdomen:  Soft, quite tender in the epigastrium/hypogastrium, fullness to the left of midline nondistended. Normal bowel sounds. Extremities:  Without edema. Neurologic:  Alert and oriented,  grossly normal neurologically. Psych:  Cooperative. Normal mood and affect.  Intake/Output from previous day: 12/11 0701 - 12/12 0700 In: 261.7 [I.V.:161.7; IV Piggyback:100] Out: -  Intake/Output this shift: No intake/output data recorded.  Lab Results: Recent Labs    06/14/22 0444 06/15/22 0422  WBC 10.6* 8.7  HGB 15.3 14.7  HCT 45.3 43.3  PLT 314 283   BMET Recent Labs    06/14/22 0444 06/15/22 0422  NA 136 137  K 4.2 4.2  CL 96* 99  CO2 29 31  GLUCOSE 156* 128*  BUN 22* 12  CREATININE 1.03 0.87  CALCIUM 8.8* 8.9   LFT Recent Labs    06/15/22 0422  PROT 6.3*  ALBUMIN 3.5  AST 21  ALT 67*  ALKPHOS 436*  BILITOT 1.2   PT/INR No results for input(s): "LABPROT", "INR" in the last 72 hours.       Assessment /  Plan:    #23  50 year old white male with history of very complicated necrotizing gallstone pancreatitis initial occurrence about 1 year ago, recent admission with recurrent pseudocyst formation. ERCP during last admission November 2023 with concerns for pancreatic duct disruption and the new 5 x 4 cm pseudocyst was aspirated Symptoms improved for short time postprocedure however over the past couple of weeks symptoms have gradually returned current and then worsened with ongoing upper abdominal pain and decreased oral intake. 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   Plan; Plan is for EUS and endoscopic cystogastrostomy today with Dr. Rush Landmark   Follow-up labs ordered for a.m.  Surgery is following, depending on his course post cyst gastrostomy, he may eventually require a distal pancreatectomy for definitive management.    Principal Problem:   Necrotizing pancreatitis Active Problems:   Essential hypertension   AKI (acute kidney injury) ([place])   Erythrocytosis   Pancreatic pseudocyst   Hypokalemia   COPD (chronic obstructive pulmonary disease) ([facility])   Nausea vomiting and diarrhea   Pseudocyst of pancreas   Acute recurrent pancreatitis     LOS: 3 days   Haruka Kowaleski PA-C12/06/2022, 8:43 AM

## 2022-06-16 NOTE — Interval H&P Note (Signed)
History and Physical Interval Note:  06/16/2022 1:17 PM  Steve Romero  has presented today for surgery, with the diagnosis of Recurrent pancreatitis and pseudocyst.  The various methods of treatment have been discussed with the patient and family. After consideration of risks, benefits and other options for treatment, the patient has consented to  Procedure(s): UPPER ENDOSCOPIC ULTRASOUND (EUS) LINEAR (N/A) as a surgical intervention.  The patient's history has been reviewed, patient examined, no change in status, stable for surgery.  I have reviewed the patient's chart and labs.  Questions were answered to the patient's satisfaction.    The risks of an EUS, including intestinal perforation, bleeding, infection, aspiration, and medication effects were discussed.  When a cystgastrostomy/cystenterostomy is performed as part of the EUS, there is an additional risk of pancreatitis at the rate of about 1-2%.  It was explained that procedure related pancreatitis is typically mild, although at times it can be severe and even life threatening.  The risks and benefits of endoscopic evaluation were discussed with the patient; these include but are not limited to the risk of perforation, infection, bleeding, missed lesions, lack of diagnosis, severe illness requiring hospitalization, as well as anesthesia and sedation related illnesses.  The patient and/or family is agreeable to proceed.      [employer]

## 2022-06-16 NOTE — Progress Notes (Signed)
   Subjective/Chief Complaint: Ab pain, npo for procedure   Objective: Vital signs in last 24 hours: Temp:  [97.7 F (36.5 C)-98.1 F (36.7 C)] 98 F (36.7 C) (12/12 0430) Pulse Rate:  [65-70] 65 (12/12 0430) Resp:  [19-20] 20 (12/12 0430) BP: (138-160)/(95-103) 150/96 (12/12 0430) SpO2:  [99 %] 99 % (12/12 0430) Last BM Date : 06/15/22  Intake/Output from previous day: 12/11 0701 - 12/12 0700 In: 261.7 [I.V.:161.7; IV Piggyback:100] Out: -  Intake/Output this shift: No intake/output data recorded.  Ab unchanged  Lab Results:  Recent Labs    06/14/22 0444 06/15/22 0422  WBC 10.6* 8.7  HGB 15.3 14.7  HCT 45.3 43.3  PLT 314 283   BMET Recent Labs    06/14/22 0444 06/15/22 0422  NA 136 137  K 4.2 4.2  CL 96* 99  CO2 29 31  GLUCOSE 156* 128*  BUN 22* 12  CREATININE 1.03 0.87  CALCIUM 8.8* 8.9   PT/INR No results for input(s): "LABPROT", "INR" in the last 72 hours. ABG No results for input(s): "PHART", "HCO3" in the last 72 hours.  Invalid input(s): "PCO2", "PO2"  Studies/Results: No results found.  Anti-infectives: Anti-infectives (From admission, onward)    Start     Dose/Rate Route Frequency Ordered Stop   06/13/22 0200  metroNIDAZOLE (FLAGYL) IVPB 500 mg  Status:  Discontinued        500 mg 100 mL/hr over 60 Minutes Intravenous Every 12 hours 06/13/22 0122 06/15/22 1315   06/13/22 0200  ceFEPIme (MAXIPIME) 2 g in sodium chloride 0.9 % 100 mL IVPB  Status:  Discontinued        2 g 200 mL/hr over 30 Minutes Intravenous Every 8 hours 06/13/22 0127 06/15/22 1315       Assessment/Plan: Complicated necrotizing pancreatitis with pancreatic pseudocyst - s/p laparoscopic cholecystectomy 10/09/20 Dr. Curlene Labrum, post-op bile leak, s/p ERCP and then developed post ERCP pancreatitis - last ERCP 11/17 with concern for pancreatic ductal disruption per Dr. Donneta Romberg procedure note -ultimately is giong to need surgery for this but I think draining  this with endoscopic cystgastrostomy reasonable first followed at some point by either surgical cystgastrostomy or more likely distal panc. -will follow up after procedure and discuss final plan again with [facility] surgeons   FEN: diet per GI VTE: LMWH ID: no current abx     Rolm Bookbinder 06/16/2022

## 2022-06-16 NOTE — Anesthesia Postprocedure Evaluation (Signed)
Anesthesia Post Note  Patient: Steve Romero  Procedure(s) Performed: UPPER ENDOSCOPIC ULTRASOUND (EUS) LINEAR PANCREATIC STENT PLACEMENT BILIARY DILATION     Patient location during evaluation: Endoscopy Anesthesia Type: General Level of consciousness: awake and alert Pain management: pain level controlled Vital Signs Assessment: post-procedure vital signs reviewed and stable Respiratory status: spontaneous breathing, nonlabored ventilation, respiratory function stable and patient connected to nasal cannula oxygen Cardiovascular status: blood pressure returned to baseline and stable Postop Assessment: no apparent nausea or vomiting Anesthetic complications: no  No notable events documented.  Last Vitals:  Vitals:   06/16/22 1515 06/16/22 1520  BP: 135/72 130/78  Pulse: 67 67  Resp: 12 12  Temp: 36.6 C   SpO2: 100% 100%    Last Pain:  Vitals:   06/16/22 1520  TempSrc:   PainSc: 0-No pain                 Barnet Glasgow

## 2022-06-16 NOTE — Anesthesia Procedure Notes (Signed)
Procedure Name: Intubation Date/Time: 06/16/2022 1:56 PM  Performed by: West Pugh, CRNAPre-anesthesia Checklist: Patient identified, Emergency Drugs available, Suction available, Patient being monitored and Timeout performed Patient Re-evaluated:Patient Re-evaluated prior to induction Oxygen Delivery Method: Circle system utilized Preoxygenation: Pre-oxygenation with 100% oxygen Induction Type: IV induction Ventilation: Mask ventilation without difficulty Laryngoscope Size: Mac and 4 Grade View: Grade I Tube type: Oral Tube size: 7.5 mm Number of attempts: 1 Airway Equipment and Method: Stylet Placement Confirmation: ETT inserted through vocal cords under direct vision, positive ETCO2, CO2 detector and breath sounds checked- equal and bilateral Secured at: 23 cm Tube secured with: Tape Dental Injury: Teeth and Oropharynx as per pre-operative assessment

## 2022-06-16 NOTE — TOC Initial Note (Signed)
Transition of Care [facility]) - Initial/Assessment Note    Patient Details  Name: Steve Romero MRN: [id number] Date of Birth: 03-14-1971  Transition of Care [facility]) CM/SW Contact:    Leeroy Cha, RN Phone Number: 06/16/2022, 8:02 AM  Clinical Narrative:                 Dmoking cessation information added to the dc instructions.  Expected Discharge Plan: Home/Self Care Barriers to Discharge: Continued Medical Work up   Patient Goals and CMS Choice Patient states their goals for this hospitalization and ongoing recovery are:: to get better and go back home CMS Medicare.gov Compare Post Acute Care list provided to:: Patient    Expected Discharge Plan and Services Expected Discharge Plan: Home/Self Care   Discharge Planning Services: CM Consult   Living arrangements for the past 2 months: Single Family Home                                      Prior Living Arrangements/Services Living arrangements for the past 2 months: Single Family Home Lives with:: Self Patient language and need for interpreter reviewed:: Yes Do you feel safe going back to the place where you live?: Yes               Activities of Daily Living Home Assistive Devices/Equipment: None ADL Screening (condition at time of admission) Patient's cognitive ability adequate to safely complete daily activities?: Yes Is the patient deaf or have difficulty hearing?: No Does the patient have difficulty seeing, even when wearing glasses/contacts?: No Does the patient have difficulty concentrating, remembering, or making decisions?: No Patient able to express need for assistance with ADLs?: Yes Does the patient have difficulty dressing or bathing?: No Independently performs ADLs?: Yes (appropriate for developmental age) Does the patient have difficulty walking or climbing stairs?: No Weakness of Legs: None Weakness of Arms/Hands: None  Permission Sought/Granted                  Emotional  Assessment Appearance:: Appears stated age Attitude/Demeanor/Rapport: Engaged Affect (typically observed): Calm Orientation: : Oriented to Self, Oriented to Place, Oriented to  Time, Oriented to Situation Alcohol / Substance Use: Tobacco Use Psych Involvement: No (comment)  Admission diagnosis:  Dehydration [E86.0] Hypokalemia [E87.6] Acute recurrent pancreatitis [K85.90] AKI (acute kidney injury) ([place]) [N17.9] Nausea vomiting and diarrhea [R11.2, R19.7] Abdominal pain [R10.9] Acute pancreatitis, unspecified complication status, unspecified pancreatitis type [K85.90] Patient Active Problem List   Diagnosis Date Noted   Nausea vomiting and diarrhea 06/15/2022   Pseudocyst of pancreas 06/15/2022   Acute recurrent pancreatitis 06/15/2022   Hypokalemia 06/13/2022   COPD (chronic obstructive pulmonary disease) ([place]) 06/13/2022   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   Pancreatic pseudocyst 05/19/2022   Prediabetes 05/17/2022   AKI (acute kidney injury) ([place]) 05/17/2022   Erythrocytosis 05/17/2022   Tobacco use disorder 04/14/2022   Exocrine pancreatic insufficiency 04/14/2022   History of pancreatitis 09/05/2021   Biliary obstruction 06/04/2021   Pancreatic duct disruption 06/04/2021   Hx of adenomatous colonic polyps 06/04/2021   Acute on chronic pancreatitis ([place]) 03/13/2021   Biliary stricture 03/13/2021   Abnormal CT scan, gastrointestinal tract 03/13/2021   Duodenitis 03/13/2021   Early satiety 01/25/2021   Abnormal CT of liver 01/25/2021   Common bile duct dilation 01/25/2021   Jaundice 01/25/2021   Elevated alkaline phosphatase level 01/25/2021   Other ascites 01/25/2021   Unintentional weight loss  01/25/2021   Hemorrhage following endoscopic retrograde cholangiopancreatography (ERCP) 12/20/2020   Bile leak    Protein-calorie malnutrition, severe 11/07/2020   Ascending cholangitis 11/05/2020   Disorder of bile duct stent 11/01/2020   Pancreatic necrosis    Empyema of  gallbladder    Calculus of gallbladder without cholecystitis without obstruction 09/26/2020   Abdominal wall mass of right upper quadrant 09/26/2020   Neoplasm of uncertain behavior 35/70/1779   Right upper quadrant abdominal pain 09/24/2020   Mass of right upper extremity 02/14/2020   Unilateral recurrent inguinal hernia without obstruction or gangrene    Carpal tunnel syndrome, left 09/24/2016   Essential hypertension 04/22/2015   Hyperlipidemia 04/22/2015   Schizoaffective disorder ([place]) 04/22/2015   Anxiety state 04/22/2015   Cigarette nicotine dependence without complication 39/09/90   Edema 04/22/2015   Lymphadenopathy 04/22/2015   PCP:  [name], DO Pharmacy:   [facility] [address], [address] [place] [address] Phone: [phone] Fax: [phone]     Social Determinants of Health (SDOH) Interventions    Readmission Risk Interventions   No data to display

## 2022-06-17 DIAGNOSIS — K209 Esophagitis, unspecified without bleeding: Secondary | ICD-10-CM | POA: Insufficient documentation

## 2022-06-17 DIAGNOSIS — K2100 Gastro-esophageal reflux disease with esophagitis, without bleeding: Secondary | ICD-10-CM

## 2022-06-17 DIAGNOSIS — I10 Essential (primary) hypertension: Secondary | ICD-10-CM

## 2022-06-17 DIAGNOSIS — E876 Hypokalemia: Secondary | ICD-10-CM

## 2022-06-17 LAB — CBC WITH DIFFERENTIAL/PLATELET
Abs Immature Granulocytes: 0.08 10*3/uL — ABNORMAL HIGH (ref 0.00–0.07)
Basophils Absolute: 0 10*3/uL (ref 0.0–0.1)
Basophils Relative: 0 %
Eosinophils Absolute: 0.1 10*3/uL (ref 0.0–0.5)
Eosinophils Relative: 1 %
HCT: 38.9 % — ABNORMAL LOW (ref 39.0–52.0)
Hemoglobin: 13.1 g/dL (ref 13.0–17.0)
Immature Granulocytes: 1 %
Lymphocytes Relative: 13 %
Lymphs Abs: 1.5 10*3/uL (ref 0.7–4.0)
MCH: 31 pg (ref 26.0–34.0)
MCHC: 33.7 g/dL (ref 30.0–36.0)
MCV: 92.2 fL (ref 80.0–100.0)
Monocytes Absolute: 0.8 10*3/uL (ref 0.1–1.0)
Monocytes Relative: 7 %
Neutro Abs: 8.9 10*3/uL — ABNORMAL HIGH (ref 1.7–7.7)
Neutrophils Relative %: 78 %
Platelets: 250 10*3/uL (ref 150–400)
RBC: 4.22 MIL/uL (ref 4.22–5.81)
RDW: 12 % (ref 11.5–15.5)
WBC: 11.4 10*3/uL — ABNORMAL HIGH (ref 4.0–10.5)
nRBC: 0 % (ref 0.0–0.2)

## 2022-06-17 LAB — COMPREHENSIVE METABOLIC PANEL
ALT: 33 U/L (ref 0–44)
AST: 10 U/L — ABNORMAL LOW (ref 15–41)
Albumin: 2.7 g/dL — ABNORMAL LOW (ref 3.5–5.0)
Alkaline Phosphatase: 263 U/L — ABNORMAL HIGH (ref 38–126)
Anion gap: 7 (ref 5–15)
BUN: 11 mg/dL (ref 6–20)
CO2: 29 mmol/L (ref 22–32)
Calcium: 8.5 mg/dL — ABNORMAL LOW (ref 8.9–10.3)
Chloride: 100 mmol/L (ref 98–111)
Creatinine, Ser: 0.81 mg/dL (ref 0.61–1.24)
GFR, Estimated: >60 mL/min (ref >60)
Glucose, Bld: 122 mg/dL — ABNORMAL HIGH (ref 70–99)
Potassium: 5.1 mmol/L (ref 3.5–5.1)
Sodium: 136 mmol/L (ref 135–145)
Total Bilirubin: 0.7 mg/dL (ref 0.3–1.2)
Total Protein: 5.4 g/dL — ABNORMAL LOW (ref 6.5–8.1)

## 2022-06-17 LAB — LIPASE, BLOOD: Lipase: 29 U/L (ref 11–51)

## 2022-06-17 LAB — MAGNESIUM: Magnesium: 2 mg/dL (ref 1.7–2.4)

## 2022-06-17 MED ORDER — HYDRALAZINE HCL 20 MG/ML IJ SOLN: 10.0000 mg | Freq: Four times a day (QID) | INTRAMUSCULAR | Status: DC | PRN

## 2022-06-17 MED ORDER — PANTOPRAZOLE SODIUM 40 MG PO TBEC
40.0000 mg | DELAYED_RELEASE_TABLET | Freq: Two times a day (BID) | ORAL | Status: DC
  Administered 2022-06-18: 40 mg via ORAL
  Filled 2022-06-17: qty 1

## 2022-06-17 NOTE — Assessment & Plan Note (Addendum)
Status post laparoscopic cholecystectomy 10/2020 by Dr. Blake Divine complicated by postoperative bile leak Status post ERCP followed by post ERCP pancreatitis with development of pseudocyst requiring cystogastrostomy and biliary stent due to obstruction More recently, patient underwent ERCP 05/22/2022 revealing an irregularity in the ventral pancreatic duct in the head of the pancreas as well as sludge noted throughout the biliary tree swept out successfully. Patient now hospitalized for increasing abdominal pain with recurrent pseudocyst formation Patient status post cystogastrostomy on 12/12 performed by Dr. Rush Landmark status post 500 cc aspiration of fluid from pseudocyst and placement of an axios stent into the pseudocyst Oral ciprofloxacin for total of 3 days Patient clinically improving Advancing diet Patient has been made an appointment with [place] surgery/Dr. Michaelle Birks for January 7. Plan is for repeat CT abdomen and pelvis in 3 to 4 weeks, and tentative EGD with stent pull in about 6 weeks.

## 2022-06-17 NOTE — Progress Notes (Signed)
PROGRESS NOTE   Steve Romero  MHD:[id number] DOB: January 17, 1971 DOA: 06/12/2022 PCP: [name], DO   Date of Service: the patient was seen and examined on 06/17/2022  Brief Narrative:  Steve Romero is a 51 y.o. male with medical history significant of anxiety, depression, bipolar disorder, schizoaffective disorder, smoking, COPD, hypertension, hyperlipidemia, BPH, GERD, cholecystectomy in 2119 complicated by bile leak then severe post ERCP necrotizing pancreatitis and development of pseudocyst requiring cystogastrostomy and biliary stent due to obstruction.  Recently admitted 11/12-11/19 for acute pancreatitis with pseudocyst formation and AKI.  MRCP revealed recurrent CBD stone.  Patient underwent ERCP on 11/17 revealing an edematous major papilla, slightly narrowed prior biliary sphincterotomy with fluoroscopic examination suspicious for sludge with distal narrowing noted in the duct.  A balloon sphincteroplasty was performed with sludge in the biliary tree being swept out successfully.  There was an additional irregularity found in the ventral pancreatic duct in the head of the pancreas but evaluation of this was unsuccessful. Biopsy of pseudocyst came back benign.  GI had recommended repeating CT pancreas protocol in 3 to 4 weeks.   Patient had a hospital follow-up visit with his PCP and endorsed worsening abdominal pain, nausea, and vomiting.  Sent to the ED for further evaluation, started on IVF and abx with GI consult.    Assessment and Plan: * Necrotizing pancreatitis Status post laparoscopic cholecystectomy 10/2020 by Dr. Blake Divine complicated by postoperative bile leak Status post ERCP followed by post ERCP pancreatitis with development of pseudocyst requiring cystogastrostomy and biliary stent due to obstruction More recently, patient underwent ERCP 05/22/2022 revealing an irregularity in the ventral pancreatic duct in the head of the pancreas as well as sludge noted  throughout the biliary tree swept out successfully. Patient now hospitalized for increasing abdominal pain with recurrent pseudocyst formation Patient status post cystogastrostomy on 12/12 performed by Dr. Rush Landmark status post 500 cc aspiration of fluid from pseudocyst and placement of an axios stent into the pseudocyst Oral ciprofloxacin for total of 3 days Patient clinically improving Advancing diet Patient has been made an appointment with [facility] surgery/Dr. Michaelle Birks for January 7. Plan is for repeat CT abdomen and pelvis in 3 to 4 weeks, and tentative EGD with stent pull in about 6 weeks.    Pancreatic pseudocyst Please assessment and plan above  AKI (acute kidney injury) ([place]) Resolved  Essential hypertension PRN intravenous antihypertensives for excessively elevated blood pressure   Gastroesophageal reflux disease with esophagitis without hemorrhage Seen on EGD 12/12 Protonix twice daily     Subjective:  Patient states that his abdominal pain is dramatically improved since yesterday.  Patient describes his pain as mild to moderate intensity, sharp in quality, epigastric in location and nonradiating.  Patient's appetite is additionally improved.  Physical Exam:  Vitals:   06/16/22 1535 06/16/22 2054 06/17/22 0540 06/17/22 1720  BP: 122/74 101/69 (!) 133/97 111/72  Pulse: 70 78 60 67  Resp: '13 20 20 20  '$ Temp:  98.3 F (36.8 C) 97.8 F (36.6 C) 97.7 F (36.5 C)  TempSrc:  Oral Oral Oral  SpO2: 96% 100% 100% 98%  Weight:      Height:         Constitutional: Awake alert and oriented x3, no associated distress.   Skin: no rashes, no lesions, good skin turgor noted. Eyes: Pupils are equally reactive to light.  No evidence of scleral icterus or conjunctival pallor.  ENMT: Moist mucous membranes noted.  Posterior pharynx clear of any exudate  or lesions.   Respiratory: clear to auscultation bilaterally, no wheezing, no crackles. Normal respiratory  effort. No accessory muscle use.  Cardiovascular: High-pitched systolic murmur noted best over the mitral valve.  No extremity edema. 2+ pedal pulses. No carotid bruits.  Abdomen: Notable epigastric tenderness that is mild.  Abdomen is soft however.  No evidence of intra-abdominal masses.  Positive bowel sounds noted in all quadrants.   Musculoskeletal: No joint deformity upper and lower extremities. Good ROM, no contractures. Normal muscle tone.    Data Reviewed:  I have personally reviewed and interpreted labs, imaging.  Significant findings are   CBC: Recent Labs  Lab 06/11/22 1212 06/12/22 1942 06/13/22 0432 06/14/22 0444 06/15/22 0422 06/17/22 0424  WBC 12.2* 14.9* 11.3* 10.6* 8.7 11.4*  NEUTROABS 9.7* 11.1*  --   --   --  8.9*  HGB 19.9* 18.3* 15.9 15.3 14.7 13.1  HCT 58.2* 51.9 45.6 45.3 43.3 38.9*  MCV 91 87.5 88.7 91.0 90.8 92.2  PLT 426 439* 348 314 283 263   Basic Metabolic Panel: Recent Labs  Lab 06/12/22 1942 06/13/22 0432 06/14/22 0444 06/15/22 0422 06/17/22 0424  NA 134* 132* 136 137 136  K 2.9* 3.3* 4.2 4.2 5.1  CL 93* 94* 96* 99 100  CO2 '26 27 29 31 29  '$ GLUCOSE 194* 152* 156* 128* 122*  BUN 39* 34* 22* 12 11  CREATININE 1.64* 1.25* 1.03 0.87 0.81  CALCIUM 9.5 8.5* 8.8* 8.9 8.5*  MG 2.4  --   --   --  2.0   GFR: Estimated Creatinine Clearance: 121.5 mL/min (by C-G formula based on SCr of 0.81 mg/dL). Liver Function Tests: Recent Labs  Lab 06/11/22 1212 06/12/22 1942 06/13/22 0432 06/15/22 0422 06/17/22 0424  AST 43* '27 19 21 '$ 10*  ALT 266* 152* 110* 67* 33  ALKPHOS 1,182* 703* 554* 436* 263*  BILITOT 0.6 0.8 0.9 1.2 0.7  PROT 8.1 8.2* 6.9 6.3* 5.4*  ALBUMIN 4.8 4.1 3.5 3.5 2.7*    Coagulation Profile: No results for input(s): "INR", "PROTIME" in the last 168 hours.   Code Status:  Full code.  Code status decision has been confirmed with: patient    Severity of Illness:  The appropriate patient status for this patient is  INPATIENT. Inpatient status is judged to be reasonable and necessary in order to provide the required intensity of service to ensure the patient's safety. The patient's presenting symptoms, physical exam findings, and initial radiographic and laboratory data in the context of their chronic comorbidities is felt to place them at high risk for further clinical deterioration. Furthermore, it is not anticipated that the patient will be medically stable for discharge from the hospital within 2 midnights of admission.   * I certify that at the point of admission it is my clinical judgment that the patient will require inpatient hospital care spanning beyond 2 midnights from the point of admission due to high intensity of service, high risk for further deterioration and high frequency of surveillance required.*  Time spent:  54 minutes  Author:  Vernelle Emerald MD  06/17/2022 7:34 PM

## 2022-06-17 NOTE — Assessment & Plan Note (Signed)
Resolved

## 2022-06-17 NOTE — Progress Notes (Addendum)
1 Day Post-Op   Subjective/Chief Complaint: Doing ok after procedure   Objective: Vital signs in last 24 hours: Temp:  [97.3 F (36.3 C)-98.3 F (36.8 C)] 97.8 F (36.6 C) (12/13 0540) Pulse Rate:  [60-78] 60 (12/13 0540) Resp:  [12-20] 20 (12/13 0540) BP: (101-160)/(63-99) 133/97 (12/13 0540) SpO2:  [91 %-100 %] 100 % (12/13 0540) Weight:  [89.4 kg] 89.4 kg (12/12 1312) Last BM Date : 06/15/22  Intake/Output from previous day: 12/12 0701 - 12/13 0700 In: 1634.7 [P.O.:480; I.V.:954.7; IV Piggyback:200] Out: -  Intake/Output this shift: No intake/output data recorded.   Lab Results:  Recent Labs    06/15/22 0422 06/17/22 0424  WBC 8.7 11.4*  HGB 14.7 13.1  HCT 43.3 38.9*  PLT 283 250   BMET Recent Labs    06/15/22 0422 06/17/22 0424  NA 137 136  K 4.2 5.1  CL 99 100  CO2 31 29  GLUCOSE 128* 122*  BUN 12 11  CREATININE 0.87 0.81  CALCIUM 8.9 8.5*   PT/INR No results for input(s): "LABPROT", "INR" in the last 72 hours. ABG No results for input(s): "PHART", "HCO3" in the last 72 hours.  Invalid input(s): "PCO2", "PO2"  Studies/Results: No results found.  Anti-infectives: Anti-infectives (From admission, onward)    Start     Dose/Rate Route Frequency Ordered Stop   06/16/22 2000  ciprofloxacin (CIPRO) tablet 500 mg        500 mg Oral 2 times daily 06/16/22 1555 06/19/22 1959   06/13/22 0200  metroNIDAZOLE (FLAGYL) IVPB 500 mg  Status:  Discontinued        500 mg 100 mL/hr over 60 Minutes Intravenous Every 12 hours 06/13/22 0122 06/15/22 1315   06/13/22 0200  ceFEPIme (MAXIPIME) 2 g in sodium chloride 0.9 % 100 mL IVPB  Status:  Discontinued        2 g 200 mL/hr over 30 Minutes Intravenous Every 8 hours 06/13/22 0127 06/15/22 1315       Assessment/Plan: Complicated necrotizing pancreatitis with pancreatic pseudocyst - s/p laparoscopic cholecystectomy 10/09/20 Dr. Curlene Labrum, post-op bile leak, s/p ERCP and then developed post ERCP  pancreatitis - last ERCP 11/17 with concern for pancreatic ductal disruption per Dr. Donneta Romberg procedure note -s/p axios stent 12/12 -appreciate GI procedure, will have him follow up with Dr Zenia Resides after next CT to discuss definitive plan   FEN: diet per GI VTE: LMWH ID: no current abx  Dispo: will see PRN  Rolm Bookbinder 06/17/2022

## 2022-06-17 NOTE — Progress Notes (Signed)
Patient ID: Steve Romero, male   DOB: 01/10/1971, 51 y.o.   MRN: [id number]    Progress Note   Subjective   Day #5 CC; history of complicated necrotizing gallstone pancreatitis, recurrent pseudocyst formation, recent admission with new pancreatic pseudocyst now presents with increasing abdominal pain and significantly enlarged pseudocyst  EUS with cystogastrostomy yesterday-finding of anechoic lesion suggestive of a cyst in the genu of the pancreas and pancreatic body communicating with the pancreatic duct measuring 9.2 x 9 cm-single compartment thinly septated-cyst gastrostomy created with Axios stent 15 x 10 mm, then 5 cm x 10 French double-pigtail stent into the pseudocyst-500 cc of fluid aspirated, grade C esophagitis  Today WBC 11.4/hemoglobin 13.1/hematocrit 38.9 Potassium 5.1/BUN 11/creatinine 0.81 Alk phos 263 remainder of LFTs normal Lipase 29  Patient says he definitely feels better pain has eased no nausea able to tolerate full liquids and thinks he may be able to handle some solid food.  No fever or chills   Objective   Vital signs in last 24 hours: Temp:  [97.3 F (36.3 C)-98.3 F (36.8 C)] 97.8 F (36.6 C) (12/13 0540) Pulse Rate:  [60-78] 60 (12/13 0540) Resp:  [12-20] 20 (12/13 0540) BP: (101-160)/(63-99) 133/97 (12/13 0540) SpO2:  [91 %-100 %] 100 % (12/13 0540) Weight:  [89.4 kg] 89.4 kg (12/12 1312) Last BM Date : 06/15/22 General:   Older white male  in NAD Heart:  Regular rate and rhythm; no murmurs Lungs: Respirations even and unlabored, lungs CTA bilaterally Abdomen:  Soft, tender in the epigastrium/hypogastrium, less full, nondistended bowel sounds are present  Extremities:  Without edema. Neurologic:  Alert and oriented,  grossly normal neurologically. Psych:  Cooperative. Normal mood and affect.  Intake/Output from previous day: 12/12 0701 - 12/13 0700 In: 1634.7 [P.O.:480; I.V.:954.7; IV Piggyback:200] Out: -  Intake/Output this shift: No  intake/output data recorded.  Lab Results: Recent Labs    06/15/22 0422 06/17/22 0424  WBC 8.7 11.4*  HGB 14.7 13.1  HCT 43.3 38.9*  PLT 283 250   BMET Recent Labs    06/15/22 0422 06/17/22 0424  NA 137 136  K 4.2 5.1  CL 99 100  CO2 31 29  GLUCOSE 128* 122*  BUN 12 11  CREATININE 0.87 0.81  CALCIUM 8.9 8.5*   LFT Recent Labs    06/17/22 0424  PROT 5.4*  ALBUMIN 2.7*  AST 10*  ALT 33  ALKPHOS 263*  BILITOT 0.7   PT/INR No results for input(s): "LABPROT", "INR" in the last 72 hours.  Studies/Results: No results found.     Assessment / Plan:    #1 51-year-old white male with history of severe complicated necrotizing pancreatitis and recurrent pseudocyst formation.  Currently admitted with increased abdominal pain and finding of significantly enlarged pseudocyst.  Patient underwent cystogastrostomy yesterday placement of Axios stent and pigtail stent, with initial drainage of at least 500 cc of material from the pseudocyst.  He feels much better today and labs are all very stable no evidence of postprocedural pancreatitis no leukocytosis.  Plan; will advance to soft diet and see if he Steve tolerate, he knows to back off if this causes nausea or increased pain Continue Cipro 500 mg twice daily for total of 3 days. Will follow-up culture He relates today that he has been made an appointment with [facility] surgery/Dr. Shelby Allen for January 7. Plan is for repeat CT abdomen and pelvis in 3 to 4 weeks, and tentative EGD with stent pull in about 6   Patient ID: Steve Romero, male   DOB: 01/10/1971, 51 y.o.   MRN: [id number]    Progress Note   Subjective   Day #5 CC; history of complicated necrotizing gallstone pancreatitis, recurrent pseudocyst formation, recent admission with new pancreatic pseudocyst now presents with increasing abdominal pain and significantly enlarged pseudocyst  EUS with cystogastrostomy yesterday-finding of anechoic lesion suggestive of a cyst in the genu of the pancreas and pancreatic body communicating with the pancreatic duct measuring 9.2 x 9 cm-single compartment thinly septated-cyst gastrostomy created with Axios stent 15 x 10 mm, then 5 cm x 10 French double-pigtail stent into the pseudocyst-500 cc of fluid aspirated, grade C esophagitis  Today WBC 11.4/hemoglobin 13.1/hematocrit 38.9 Potassium 5.1/BUN 11/creatinine 0.81 Alk phos 263 remainder of LFTs normal Lipase 29  Patient says he definitely feels better pain has eased no nausea able to tolerate full liquids and thinks he may be able to handle some solid food.  No fever or chills   Objective   Vital signs in last 24 hours: Temp:  [97.3 F (36.3 C)-98.3 F (36.8 C)] 97.8 F (36.6 C) (12/13 0540) Pulse Rate:  [60-78] 60 (12/13 0540) Resp:  [12-20] 20 (12/13 0540) BP: (101-160)/(63-99) 133/97 (12/13 0540) SpO2:  [91 %-100 %] 100 % (12/13 0540) Weight:  [89.4 kg] 89.4 kg (12/12 1312) Last BM Date : 06/15/22 General:   Older white male  in NAD Heart:  Regular rate and rhythm; no murmurs Lungs: Respirations even and unlabored, lungs CTA bilaterally Abdomen:  Soft, tender in the epigastrium/hypogastrium, less full, nondistended bowel sounds are present  Extremities:  Without edema. Neurologic:  Alert and oriented,  grossly normal neurologically. Psych:  Cooperative. Normal mood and affect.  Intake/Output from previous day: 12/12 0701 - 12/13 0700 In: 1634.7 [P.O.:480; I.V.:954.7; IV Piggyback:200] Out: -  Intake/Output this shift: No  intake/output data recorded.  Lab Results: Recent Labs    06/15/22 0422 06/17/22 0424  WBC 8.7 11.4*  HGB 14.7 13.1  HCT 43.3 38.9*  PLT 283 250   BMET Recent Labs    06/15/22 0422 06/17/22 0424  NA 137 136  K 4.2 5.1  CL 99 100  CO2 31 29  GLUCOSE 128* 122*  BUN 12 11  CREATININE 0.87 0.81  CALCIUM 8.9 8.5*   LFT Recent Labs    06/17/22 0424  PROT 5.4*  ALBUMIN 2.7*  AST 10*  ALT 33  ALKPHOS 263*  BILITOT 0.7   PT/INR No results for input(s): "LABPROT", "INR" in the last 72 hours.  Studies/Results: No results found.     Assessment / Plan:    #1 51-year-old white male with history of severe complicated necrotizing pancreatitis and recurrent pseudocyst formation.  Currently admitted with increased abdominal pain and finding of significantly enlarged pseudocyst.  Patient underwent cystogastrostomy yesterday placement of Axios stent and pigtail stent, with initial drainage of at least 500 cc of material from the pseudocyst.  He feels much better today and labs are all very stable no evidence of postprocedural pancreatitis no leukocytosis.  Plan; will advance to soft diet and see if he Steve tolerate, he knows to back off if this causes nausea or increased pain Continue Cipro 500 mg twice daily for total of 3 days. Will follow-up culture He relates today that he has been made an appointment with [facility] surgery/Dr. Shelby Allen for January 7. Plan is for repeat CT abdomen and pelvis in 3 to 4 weeks, and tentative EGD with stent pull in about 6

## 2022-06-17 NOTE — Assessment & Plan Note (Signed)
PRN intravenous antihypertensives for excessively elevated blood pressure

## 2022-06-17 NOTE — Assessment & Plan Note (Addendum)
Please assessment and plan above

## 2022-06-17 NOTE — Assessment & Plan Note (Signed)
Seen on EGD 12/12 Protonix twice daily

## 2022-06-18 ENCOUNTER — Other Ambulatory Visit: Payer: Self-pay

## 2022-06-18 ENCOUNTER — Telehealth: Payer: Self-pay

## 2022-06-18 DIAGNOSIS — Z8719 Personal history of other diseases of the digestive system: Secondary | ICD-10-CM

## 2022-06-18 DIAGNOSIS — K859 Acute pancreatitis without necrosis or infection, unspecified: Secondary | ICD-10-CM

## 2022-06-18 DIAGNOSIS — R7989 Other specified abnormal findings of blood chemistry: Secondary | ICD-10-CM

## 2022-06-18 DIAGNOSIS — R1114 Bilious vomiting: Secondary | ICD-10-CM

## 2022-06-18 LAB — CBC WITH DIFFERENTIAL/PLATELET
Abs Immature Granulocytes: 0.05 10*3/uL (ref 0.00–0.07)
Basophils Absolute: 0.1 10*3/uL (ref 0.0–0.1)
Basophils Relative: 1 %
Eosinophils Absolute: 0.3 10*3/uL (ref 0.0–0.5)
Eosinophils Relative: 3 %
HCT: 39.1 % (ref 39.0–52.0)
Hemoglobin: 13 g/dL (ref 13.0–17.0)
Immature Granulocytes: 1 %
Lymphocytes Relative: 24 %
Lymphs Abs: 2.5 10*3/uL (ref 0.7–4.0)
MCH: 30.8 pg (ref 26.0–34.0)
MCHC: 33.2 g/dL (ref 30.0–36.0)
MCV: 92.7 fL (ref 80.0–100.0)
Monocytes Absolute: 0.8 10*3/uL (ref 0.1–1.0)
Monocytes Relative: 8 %
Neutro Abs: 6.9 10*3/uL (ref 1.7–7.7)
Neutrophils Relative %: 63 %
Platelets: 245 10*3/uL (ref 150–400)
RBC: 4.22 MIL/uL (ref 4.22–5.81)
RDW: 12.2 % (ref 11.5–15.5)
WBC: 10.5 10*3/uL (ref 4.0–10.5)
nRBC: 0 % (ref 0.0–0.2)

## 2022-06-18 LAB — COMPREHENSIVE METABOLIC PANEL
ALT: 26 U/L (ref 0–44)
AST: 9 U/L — ABNORMAL LOW (ref 15–41)
Albumin: 2.8 g/dL — ABNORMAL LOW (ref 3.5–5.0)
Alkaline Phosphatase: 255 U/L — ABNORMAL HIGH (ref 38–126)
Anion gap: 5 (ref 5–15)
BUN: 14 mg/dL (ref 6–20)
CO2: 28 mmol/L (ref 22–32)
Calcium: 8.6 mg/dL — ABNORMAL LOW (ref 8.9–10.3)
Chloride: 104 mmol/L (ref 98–111)
Creatinine, Ser: 0.73 mg/dL (ref 0.61–1.24)
GFR, Estimated: >60 mL/min (ref >60)
Glucose, Bld: 110 mg/dL — ABNORMAL HIGH (ref 70–99)
Potassium: 4.3 mmol/L (ref 3.5–5.1)
Sodium: 137 mmol/L (ref 135–145)
Total Bilirubin: 0.4 mg/dL (ref 0.3–1.2)
Total Protein: 5.7 g/dL — ABNORMAL LOW (ref 6.5–8.1)

## 2022-06-18 LAB — CULTURE, BLOOD (ROUTINE X 2)
Culture: NO GROWTH
Culture: NO GROWTH
Special Requests: ADEQUATE
Special Requests: ADEQUATE

## 2022-06-18 LAB — MAGNESIUM: Magnesium: 2 mg/dL (ref 1.7–2.4)

## 2022-06-18 MED ORDER — POLYETHYLENE GLYCOL 3350 17 G PO PACK: 17.0000 g | PACK | Freq: Every day | ORAL | 0 refills | Status: DC | PRN

## 2022-06-18 MED ORDER — OXYCODONE HCL 10 MG PO TABS: 10.0000 mg | ORAL_TABLET | Freq: Four times a day (QID) | ORAL | 0 refills | Status: AC | PRN

## 2022-06-18 MED ORDER — POLYETHYLENE GLYCOL 3350 17 G PO PACK
17.0000 g | PACK | Freq: Every day | ORAL | Status: DC
  Administered 2022-06-18: 17 g via ORAL
  Filled 2022-06-18: qty 1

## 2022-06-18 MED ORDER — PANTOPRAZOLE SODIUM 40 MG PO TBEC: DELAYED_RELEASE_TABLET | ORAL | 1 refills | Status: DC

## 2022-06-18 MED ORDER — CIPROFLOXACIN HCL 500 MG PO TABS: 500.0000 mg | ORAL_TABLET | Freq: Two times a day (BID) | ORAL | 0 refills | Status: AC

## 2022-06-18 NOTE — Discharge Summary (Signed)
Physician Discharge Summary   Patient: Steve Romero MRN: [id number] DOB: 07-15-1970  Admit date:     06/12/2022  Discharge date: 06/18/22  Discharge Physician: Vernelle Emerald   PCP: [name], DO   Recommendations at discharge:   Patient was instructed to follow-up on 07/21/2022 at 3:20 PM with Dr. Rush Landmark with gastroenterology.   Eventual plan is for patient undergo repeat EUS on 1/25 at 9:30 AM with Dr. Rush Landmark with gastroenterology and likely removal of his stent Arrangements were made for the patient to have a repeat CT scan of the abdomen pelvis on 07/25/2022 at 3:30 PM for reassessment of his pseudocyst Patient additionally was instructed to follow-up with Dr. Zenia Resides with general surgery on 07/07/2022 at 10:50 AM for evaluation of surgical options for his recurrent disease. Patient was sent home with several more days of oral ciprofloxacin to complete his course of therapy.  Discharge Diagnoses: Principal Problem:   Necrotizing pancreatitis Active Problems:   Pancreatic pseudocyst   AKI (acute kidney injury) ([place])   Essential hypertension   Gastroesophageal reflux disease with esophagitis without hemorrhage   Erythrocytosis   Hypokalemia   COPD (chronic obstructive pulmonary disease) (H[facility])   Nausea vomiting and diarrhea   Pseudocyst of pancreas   Acute recurrent pancreatitis   Abnormal LFTs   Bilious vomiting with nausea   Acute pancreatitis  Resolved Problems:   Sepsis [facility])   Hospital Course: Steve Romero is a 51 y.o. male with medical history significant of anxiety, depression, bipolar disorder, schizoaffective disorder, smoking, COPD, hypertension, hyperlipidemia, BPH, GERD, cholecystectomy in 5993 complicated by bile leak then severe post ERCP necrotizing pancreatitis and development of pseudocyst requiring cystogastrostomy and biliary stent due to obstruction.  Recently admitted 11/12-11/19 for acute pancreatitis with pseudocyst formation and AKI.   MRCP revealed recurrent CBD stone.  Patient underwent ERCP on 11/17 revealing an edematous major papilla, slightly narrowed prior biliary sphincterotomy with fluoroscopic examination suspicious for sludge with distal narrowing noted in the duct.  A balloon sphincteroplasty was performed with sludge in the biliary tree being swept out successfully.  There was an additional irregularity found in the ventral pancreatic duct in the head of the pancreas but evaluation of this was unsuccessful. Biopsy of pseudocyst came back benign.  GI had recommended repeating CT pancreas protocol in 3 to 4 weeks.   Patient had a hospital follow-up visit with his PCP and endorsed worsening abdominal pain, nausea, and vomiting.  Sent to the ED for further evaluation.  Upon evaluation in the [facility] emergency department repeat CT imaging of the abdomen pelvis revealed an enlarging pseudocyst with increased pain thought to be secondary to mass effect of the enlarged pseudocyst.  Both gastroenterology and Dr. Donne Hazel with general surgery were consulted.  Patient underwent cystogastrostomy on 12/12 with placement of an Axios stent and pigtail stent with initial drainage of at least 500 cc of material from the pseudocyst.  This resulted in substantial improvement in symptoms.  Patient was placed on a 3-day course of ciprofloxacin.  Plan was for patient to be discharged with close outpatient follow-up with both gastroenterology and surgery.  Gastroenterology was planning to perform a repeat CT imaging of the abdomen pelvis on 07/25/2022 for reassessment of the patient's pseudocyst.  Gastroenterology was additionally planning on performing a repeat EUS on 1/25 for likely removal of patient's Axios stent.  General surgery felt that the patient's definitive treatment would require surgical resection with a hepatobiliary surgeon.  They help to arrange for outpatient  follow-up after discharge with Dr. Zenia Resides on 1/2 for further  evaluation of surgical options.  Patient was discharged in improved and stable condition on 06/18/2022.    Pain control - [employer] Controlled Substance Reporting System database was reviewed. and patient was instructed, not to drive, operate heavy machinery, perform activities at heights, swimming or participation in water activities or provide baby-sitting services while on Pain, Sleep and Anxiety Medications; until their outpatient Physician has advised to do so again. Also recommended to not to take more than prescribed Pain, Sleep and Anxiety Medications.   Consultants: Dr. Donne Hazel with surgery, Dr. Rush Landmark with gastroenterology Procedures performed: EUS 12/12 with axios stent placement  Disposition: Home Diet recommendation:  Discharge Diet Orders (From admission, onward)     Start     Ordered   06/18/22 0000  Diet - low sodium heart healthy        06/18/22 1152           Cardiac diet  DISCHARGE MEDICATION: Allergies as of 06/18/2022       Reactions   Desipramine Nausea Only, Rash        Medication List     TAKE these medications    ciprofloxacin 500 MG tablet Commonly known as: CIPRO Take 1 tablet (500 mg total) by mouth 2 (two) times daily for 3 doses.   lipase/protease/amylase 36000 UNITS Cpep capsule Commonly known as: CREON Take 2 capsules (72,000 Units total) by mouth 3 (three) times daily with meals.   ondansetron 4 MG disintegrating tablet Commonly known as: ZOFRAN-ODT Take 1 tablet (4 mg total) by mouth every 8 (eight) hours as needed for nausea or vomiting.   Oxycodone HCl 10 MG Tabs Take 1 tablet (10 mg total) by mouth every 6 (six) hours as needed for up to 3 days for severe pain.   pantoprazole 40 MG tablet Commonly known as: PROTONIX Take 1 tablet (40 mg total) by mouth 2 (two) times daily before a meal for 30 days, THEN 1 tablet (40 mg total) daily. Start taking on: June 18, 2022 What changed: See the new instructions.    polyethylene glycol 17 g packet Commonly known as: MIRALAX / GLYCOLAX Take 17 g by mouth daily as needed (constipation). May take up to twice daily if once daily is ineffective        Follow-up Information     Dwan Bolt, MD Follow up on 07/07/2022.   Specialty: General Surgery Why: 1050 am Contact information: [address] [place] [address] [phone]         Mansouraty, Telford Nab., MD Follow up.   Specialties: Gastroenterology, Internal Medicine Why: You will be contacted with a follow up appointment in the next several days. Contact information: [place] [address] [phone]                 Discharge Exam: Danley Danker Weights   06/13/22 1657 06/16/22 1312  Weight: 89 kg 89.4 kg    Constitutional: Awake alert and oriented x3, no associated distress.   Respiratory: clear to auscultation bilaterally, no wheezing, no crackles. Normal respiratory effort. No accessory muscle use.  Cardiovascular: Regular rate and rhythm, no murmurs / rubs / gallops. No extremity edema. 2+ pedal pulses. No carotid bruits.  Abdomen: Baseline epigastric tenderness.  Abdomen is soft.  No evidence of intra-abdominal masses.  Positive bowel sounds noted in all quadrants.   Musculoskeletal: No joint deformity upper and lower extremities. Good ROM, no  contractures. Normal muscle tone.     Condition at discharge: fair  The results of significant diagnostics from this hospitalization (including imaging, microbiology, ancillary and laboratory) are listed below for reference.   Imaging Studies: CT ABDOMEN PELVIS W CONTRAST  Result Date: 06/13/2022 CLINICAL DATA:  Abdominal pain, acute, nonlocalized. Nausea, vomiting, diarrhea. EXAM: CT ABDOMEN AND PELVIS WITH CONTRAST TECHNIQUE: Multidetector CT imaging of the abdomen and pelvis was performed using the standard protocol following bolus administration of intravenous contrast. RADIATION DOSE REDUCTION: This  exam was performed according to the departmental dose-optimization program which includes automated exposure control, adjustment of the mA and/or kV according to patient size and/or use of iterative reconstruction technique. CONTRAST:  147m OMNIPAQUE IOHEXOL 300 MG/ML  SOLN COMPARISON:  05/17/2022, MRI 05/19/2022 FINDINGS: Lower chest: No acute abnormality. Hepatobiliary: Mild intra and moderate extrahepatic biliary ductal dilation has progressed in the interval with the extrahepatic bile duct measuring up to 17 mm in diameter. There is relatively abrupt narrowing of the distal extrahepatic duct, best seen on axial image # 35 and coronal image # 78 which may relate to inflammatory change related to recent endoscopic procedure, acute inflammatory process involving the pancreas, or mass effect related to the enlarging pancreatic pseudocyst. Areas of probable geographic fatty hepatic infiltration with central left hepatic lobe are unchanged. No enhancing intrahepatic mass. Cholecystectomy has been performed. Pancreas: The dominant pancreatic pseudocyst within the mid body of the pancreas has enlarged, now measuring 7.2 x 7.9 x 8.9 cm in greatest dimension on axial image # 30 and coronal image # 68. This demonstrates mass effect upon the distal body of the stomach anteriorly and portosplenic confluence which is nearly obliterated posteriorly. The pancreatic parenchyma within the mid body of the pancreas is not well visualized in this may reflect the sequela of necrotizing pancreatitis with resultant separation of the pancreatic tail which demonstrates normal enhancement from the residual pancreatic head. The pseudocyst appears to communicate with the main duct within the pancreatic tail best seen on coronal image # 82/4. Finally, there is superimposed peripancreatic inflammatory changes involving the head of the pancreas which may reflect residual or recurrent changes of acute pancreatitis. Spleen: Unremarkable  Adrenals/Urinary Tract: Adrenal glands are unremarkable. Kidneys are normal, without renal calculi, focal lesion, or hydronephrosis. Bladder is unremarkable. Stomach/Bowel: Post inflammatory adhesions with tethering of the mid transverse colon are again identified. There are inflammatory changes involving the second and third portion of the duodenum with hyperemia and mild wall thickening identified, likely related to the adjacent inflammatory process involving the pancreas. No evidence of obstruction. The stomach, small bowel, and large bowel are otherwise unremarkable. Appendix normal. No free intraperitoneal gas or fluid. Vascular/Lymphatic: As noted above, marked mass effect with near obliteration of the terminal splenic vein, superior mesenteric vein, and proximal portal vein at the portosplenic confluence. The abdominal vasculature is otherwise unremarkable. Reproductive: Prostate is unremarkable. Other: Small fat containing right inguinal hernia again identified. Musculoskeletal: Are degenerative changes are seen within the lumbar spine at L3-4. No acute bone abnormality. No lytic or blastic bone lesion IMPRESSION: 1. Interval enlargement of a dominant pancreatic pseudocyst within the mid body of the pancreas, now measuring 8.9 cm in greatest dimension. This demonstrates mass effect upon the distal body of the stomach anteriorly and portosplenic confluence which is nearly obliterated posteriorly. This may result from necrosis of the mid body of the pancreas, separation of the viable pancreatic tail, and communication of the collection with the main duct of the a tail  of the pancreas. Gastroenterology consultation for endoscopic cyst-gastrostomy be helpful for further management. 2. Superimposed peripancreatic inflammatory changes, possibly representing residual or recurrent acute pancreatitis. Secondary inflammatory changes involving the second and third portion of the duodenum without associated obstruction.  3. Progressive intra and moderate extrahepatic biliary ductal dilation with relatively abrupt narrowing of the distal extrahepatic duct. This may relate to inflammatory change related to recent endoscopic procedure, acute inflammatory process involving the pancreas, or mass effect related to the enlarging pancreatic pseudocyst. 4. Post inflammatory adhesions with tethering of the mid transverse colon again identified. No evidence of obstruction. Electronically Signed   By: Fidela Salisbury M.D.   On: 06/13/2022 00:34   DG ERCP  Result Date: 05/22/2022 CLINICAL DATA:  Acute pancreatitis. EXAM: ERCP COMPARISON:  ERCP, 07/24/2021. CT AP, 05/17/2022. MR CP, 05/19/2022. FLUOROSCOPY: Exposure Index (as provided by the fluoroscopic device): 230 mGy Kerma FINDINGS: Multiple, limited oblique planar images of the RIGHT upper quadrant obtained C-arm. Images demonstrating flexible endoscopy, biliary duct cannulation, retrograde cholangiogram and balloon sweep. Mild extrahepatic biliary ductal dilation with distal common bile duct filling defect is suspected. Cholecystectomy clips. IMPRESSION: Fluoroscopic imaging for ERCP. Mild extrahepatic biliary ductal dilation, with distal common bile duct filling defects. For complete description of intra procedural findings, please see performing service dictation. Electronically Signed   By: Michaelle Birks M.D.   On: 05/22/2022 16:33    Microbiology: Results for orders placed or performed during the hospital encounter of 06/12/22  Culture, blood (Routine X 2) w Reflex to ID Panel     Status: None   Collection Time: 06/13/22  2:18 AM   Specimen: BLOOD  Result Value Ref Range Status   Specimen Description   Final    BLOOD BLOOD RIGHT FOREARM Performed at [facility] [address], [place], [place] [address]    Special Requests   Final    BOTTLES DRAWN AEROBIC AND ANAEROBIC Blood Culture adequate volume Performed at [facility] [address] [place], [place] [address]    Culture   Final    NO GROWTH 5 DAYS Performed at [facility], [place] [address] [place], [place] [address]    Report Status 06/18/2022 FINAL  Final  Culture, blood (Routine X 2) w Reflex to ID Panel     Status: None   Collection Time: 06/13/22  3:04 AM   Specimen: BLOOD  Result Value Ref Range Status   Specimen Description   Final    BLOOD BLOOD LEFT FOREARM Performed at [facility] [address], [place], [place] [address]    Special Requests   Final    BOTTLES DRAWN AEROBIC AND ANAEROBIC Blood Culture adequate volume Performed at [facility] [address] [place], [place] [address]    Culture   Final    NO GROWTH 5 DAYS Performed at [facility], [place] [address] [place], [place] [address]    Report Status 06/18/2022 FINAL  Final  Body fluid culture w Gram Stain     Status: None (Preliminary result)   Collection Time: 06/16/22  2:58 PM   Specimen: PATH Cytology Cyst; Body Fluid  Result Value Ref Range Status   Specimen Description   Final    CYSTS Performed at [facility] [address] [place], [place] [address]    Special Requests PANCREATIC CYST  Final   Gram Stain NO WBC SEEN NO ORGANISMS SEEN CYTOSPIN SMEAR  Final   Culture   Final    NO GROWTH 2 DAYS Performed at [facility], [place] [address] [place], [place] [address]    Report Status PENDING  Incomplete    Labs: CBC: Recent Labs  Lab 06/12/22 1942 06/13/22 0432 06/14/22 0444 06/15/22 0422 06/17/22 0424 06/18/22 0446  WBC 14.9* 11.3* 10.6* 8.7 11.4* 10.5  NEUTROABS 11.1*  --   --   --  8.9* 6.9  HGB 18.3* 15.9 15.3 14.7 13.1 13.0  HCT 51.9 45.6 45.3 43.3 38.9* 39.1  MCV 87.5 88.7 91.0 90.8 92.2 92.7  PLT 439* 348 314 283 250 975   Basic Metabolic Panel: Recent Labs  Lab 06/12/22 1942 06/13/22 0432 06/14/22 0444 06/15/22 0422 06/17/22 0424 06/18/22 0446  NA 134*  132* 136 137 136 137  K 2.9* 3.3* 4.2 4.2 5.1 4.3  CL 93* 94* 96* 99 100 104  CO2 '26 27 29 31 29 28  '$ GLUCOSE 194* 152* 156* 128* 122* 110*  BUN 39* 34* 22* '12 11 14  '$ CREATININE 1.64* 1.25* 1.03 0.87 0.81 0.73  CALCIUM 9.5 8.5* 8.8* 8.9 8.5* 8.6*  MG 2.4  --   --   --  2.0 2.0   Liver Function Tests: Recent Labs  Lab 06/12/22 1942 06/13/22 0432 06/15/22 0422 06/17/22 0424 06/18/22 0446  AST '27 19 21 '$ 10* 9*  ALT 152* 110* 67* 33 26  ALKPHOS 703* 554* 436* 263* 255*  BILITOT 0.8 0.9 1.2 0.7 0.4  PROT 8.2* 6.9 6.3* 5.4* 5.7*  ALBUMIN 4.1 3.5 3.5 2.7* 2.8*   CBG: No results for input(s): "GLUCAP" in the last 168 hours.  Discharge time spent: greater than 30 minutes.  Signed: Vernelle Emerald, MD [facility] 06/18/2022

## 2022-06-18 NOTE — Telephone Encounter (Signed)
EUS has been scheduled for 07/30/22 at 930 am at [facility] with GM   CT rescheduled to 07/13/22 at 330 pm arrive at 315 pm at [facility],  NPO 4 hours   Follow up appt with GM on 07/21/22 at 3:20 pm   Line busy - all information has been sent to the pt via My Chart

## 2022-06-18 NOTE — Telephone Encounter (Signed)
-----   Message from Irving Copas., MD sent at 06/18/2022  4:00 AM EST ----- Regarding: RE: Cancel CT for 12/27- AE, Thanks for sending this.  Elsie Baynes, CT needs to be rescheduled for 3 to 4 weeks from now. Clinic visit the next week after CT (okay to use overbook slot or held slot to get him in) EGD at approximately 6-7weeks from now with AXIOS stent pull (January 25 date should work). Thanks. GM  ----- Message ----- From: Alfredia Ferguson, PA-C Sent: 06/17/2022  11:44 AM EST To: Timothy Lasso, RN; Irving Copas., MD Subject: Cancel CT for 12/27Chong Sicilian, I am sure Dr. Rush Landmark sent to instructions after he did EUS and cyst gastrostomy on this patient yesterday who is inpatient  And says he is scheduled for outpatient CT abdomen and pelvis on December 27, this needs to be canceled and he needs to be scheduled for follow-up CT abdomen and pelvis in 3 to 4 weeks for follow-up of pancreatic pseudocyst status post endoscopic drainage  He also needs to be scheduled for follow-up EGD with probable stent removal in about 6 weeks  Not sure if Dr. Rush Landmark  wants to see him in the interim?  Hopefully he will be able to be discharged tomorrow

## 2022-06-18 NOTE — Progress Notes (Addendum)
Progress Note  Primary GI: Dr. Rush Landmark   Subjective  Chief Complaint: Recurrent pancreatitis admitted 12/9  Patient in chair in the bed eating breakfast. Denies nausea, vomiting, fever or chills. States the pain has improved but he continues to get oxycodone 10 mg every 5-6 hours and Dilaudid once daily. Patient states pain is better but when he goes home he is concerned about pain management. Patient has not had a bowel movement in 3 to 4 days.    Objective   Vital signs in last 24 hours: Temp:  [97.7 F (36.5 C)-98.8 F (37.1 C)] 98.7 F (37.1 C) (12/14 0520) Pulse Rate:  [67-75] 75 (12/14 0520) Resp:  [16-20] 20 (12/14 0520) BP: (111-126)/(71-79) 126/79 (12/14 0520) SpO2:  [97 %-99 %] 97 % (12/14 0520) Last BM Date : 06/16/22 Last BM recorded by nurses in past 5 days No data recorded  General:   male in no acute distress  Heart:  Regular rate and rhythm; no murmurs Pulm: Clear anteriorly; no wheezing Abdomen:  Soft, Non-distended AB, Sluggish bowel sounds. mild tenderness in the epigastrium. Without guarding and Without rebound, No organomegaly appreciated. Extremities:  without  edema. Neurologic:  Alert and  oriented x4;  No focal deficits.  Psych:  Cooperative. Normal mood and affect.  Intake/Output from previous day: 12/13 0701 - 12/14 0700 In: 1440 [P.O.:1440] Out: -  Intake/Output this shift: No intake/output data recorded.  Studies/Results: No results found.  Lab Results: Recent Labs    06/17/22 0424 06/18/22 0446  WBC 11.4* 10.5  HGB 13.1 13.0  HCT 38.9* 39.1  PLT 250 245   BMET Recent Labs    06/17/22 0424 06/18/22 0446  NA 136 137  K 5.1 4.3  CL 100 104  CO2 29 28  GLUCOSE 122* 110*  BUN 11 14  CREATININE 0.81 0.73  CALCIUM 8.5* 8.6*   LFT Recent Labs    06/18/22 0446  PROT 5.7*  ALBUMIN 2.8*  AST 9*  ALT 26  ALKPHOS 255*  BILITOT 0.4   PT/INR No results for input(s): "LABPROT", "INR" in the last 72  hours.   Scheduled Meds:  ciprofloxacin  500 mg Oral BID   feeding supplement  237 mL Oral BID BM   pantoprazole  40 mg Oral BID AC   Continuous Infusions:    Patient profile:   51 year old male complicated necrotizing gallstone pancreatitis recurrent pseudocyst formation previously treated with cyst gastrostomy. ERCP concerning pancreatic duct disruption EUS 5 x 4 cm pseudocyst drained   Impression/Plan:   Acute on chronic pancreatitis with recurrent symptomatic pseudocyst possible pancreatic duct disruption 12/12 EUS with cystogastrostomy finding of anechoic lesion suggestive of a cyst in the genu of the pancreas and pancreatic body communicating with the pancreatic duct measuring 9.2 x 9 cm-single compartment thinly septated-cyst gastrostomy created with Axios stent 15 x 10 mm, then 5 cm x 10 French double-pigtail stent into the pseudocyst-500 cc of fluid aspirated, grade C esophagitis   Improving WBC 11.4-10.5, improving LFTs [facility] appointment with Dr. Zenia Resides January 7 Plan repeat CT abdomen pelvis 3 to 4 weeks Tentative EGD 6 weeks can be arranged outpatient. Pending culture On Cipro 500 mg twice daily total of 3 days Patient is concerned about pain management, , suggest following up with primary care for pain medictions, and suggest outpatient pain management referral, can consider gabapentin 300-600 mg at night to help with pain and advance as tolerated Any further recommendations per Dr. Rush Landmark  Constipation likely secondary to  opioids Added on MiraLAX once daily Can go up to twice daily, consider amitiza or Movantik outpatient  Will arrange for outpatient follow-up to set up EGD. Please contact GI if any further questions during this hospital admission.  Principal Problem:   Necrotizing pancreatitis Active Problems:   Essential hypertension   AKI (acute kidney injury) ([place])   Erythrocytosis   Pancreatic pseudocyst   Hypokalemia   COPD (chronic obstructive  pulmonary disease) ([facility])   Nausea vomiting and diarrhea   Pseudocyst of pancreas   Acute recurrent pancreatitis   Gastroesophageal reflux disease with esophagitis without hemorrhage   Abnormal LFTs    LOS: 5 days   Steve Romero  06/18/2022, 8:22 AM

## 2022-06-18 NOTE — Discharge Instructions (Addendum)
Please take all medications exactly as instructed including complete your antibiotic course of ciprofloxacin and taking your new prescription for Protonix as instructed Gastroenterology will reach out to you for a follow-up appointment in the coming days, please follow-up with them as instructed. Gastroenterology will coordinate having you come back for repeat CT imaging of your abdomen and pelvis in approximately 3 to 4 weeks Gastroenterology will additionally coordinate for you to have a repeat upper endoscopy in approximately 6 weeks for potential removal of your stent Finally, please follow-up with surgery on January 2 with Dr. Zenia Resides as scheduled

## 2022-06-19 LAB — BODY FLUID CULTURE W GRAM STAIN
Culture: NO GROWTH
Gram Stain: NONE SEEN

## 2022-06-19 NOTE — Telephone Encounter (Signed)
Attempted to reach pt line rings no answer no voice mail

## 2022-06-19 NOTE — Telephone Encounter (Signed)
Transition Care Management Unsuccessful Follow-up Telephone Call  Date of discharge and from where:  [name] 06/18/2022  Attempts:  1st Attempt  Reason for unsuccessful TCM follow-up call:  No answer/busy

## 2022-06-19 NOTE — Progress Notes (Signed)
[facility] pancreatic fluid analysis Fluid collection from 05/22/2022  CEA 20 ng/mL Amylase 32,947 units/L  This fluid analysis is most consistent with a pseudocyst.  Hopefully, patient does not have recurrence of his pseudocyst that may require repeat drainage procedures and concern for what I had found on ERCP of a likely pancreatic duct disruption.  Time will tell.   Justice Britain, MD [facility] Office # [phone]

## 2022-06-21 ENCOUNTER — Encounter (HOSPITAL_COMMUNITY): Payer: Self-pay | Admitting: Gastroenterology

## 2022-06-22 NOTE — Telephone Encounter (Signed)
Left message on machine to call back  

## 2022-06-22 NOTE — Telephone Encounter (Signed)
Thanks for helping. Patty just go ahead and put it in for follow-up for next week or the week after as they are good with following up and if he does not answer his sister will answer. Thanks. GM

## 2022-06-22 NOTE — Telephone Encounter (Signed)
FYI Dr Rush Landmark I have been unable to reach the pt by phone or My Chart.  I did mail all the information to the pt home address.  I also tried the pt's sister number with no success.

## 2022-06-26 ENCOUNTER — Encounter: Payer: Self-pay | Admitting: Family Medicine

## 2022-06-26 ENCOUNTER — Ambulatory Visit: Payer: Medicaid Other | Admitting: Family Medicine

## 2022-06-26 VITALS — BP 130/79 | HR 77 | Temp 97.7°F | Ht 70.0 in | Wt 203.8 lb

## 2022-06-26 DIAGNOSIS — K861 Other chronic pancreatitis: Secondary | ICD-10-CM | POA: Diagnosis not present

## 2022-06-26 DIAGNOSIS — K859 Acute pancreatitis without necrosis or infection, unspecified: Secondary | ICD-10-CM | POA: Diagnosis not present

## 2022-06-26 MED ORDER — OXYCODONE HCL 5 MG PO TABS: 5.0000 mg | ORAL_TABLET | Freq: Four times a day (QID) | ORAL | 0 refills | Status: DC | PRN

## 2022-06-26 NOTE — Progress Notes (Signed)
Subjective: CC: Follow-up hospitalization PCP: [name], DO Steve Romero is a 51 y.o. male presenting to clinic today for:  1.  Acute on chronic pancreatitis Patient was admitted a second time on 06/12/2022 for acute on chronic pancreatitis.  He now has a stent placement in and will be awaiting repeat imaging and possible removal of the stent next month.  He continues to have some left-sided abdominal pain but is able to tolerate p.o. without difficulty.  Utilizing Zofran and Protonix as directed.   ROS: Per HPI  Allergies  Allergen Reactions   Desipramine Nausea Only and Rash   Past Medical History:  Diagnosis Date   Acute pancreatitis without necrosis or infection, unspecified    AKI (acute kidney injury) ([place]) 05/17/2022   Anxiety 10/2014   Ascites    Bipolar disorder ([place]) age 58   COPD (chronic obstructive pulmonary disease) ([place]) 2016   Depression age 83   Enlarged prostate    Hyperlipidemia 2016   Hypertension 2013   Nausea vomiting and diarrhea 06/15/2022   Schizoaffective disorder ([place]) 11/13/2014   Sleep apnea    Does not wear c-pap, sleeps in sitting up position per pt   Thyroid disease 11/2014    Current Outpatient Medications:    lipase/protease/amylase (CREON) 36000 UNITS CPEP capsule, Take 2 capsules (72,000 Units total) by mouth 3 (three) times daily with meals., Disp: 270 capsule, Rfl: 1   ondansetron (ZOFRAN-ODT) 4 MG disintegrating tablet, Take 1 tablet (4 mg total) by mouth every 8 (eight) hours as needed for nausea or vomiting., Disp: 60 tablet, Rfl: 1   pantoprazole (PROTONIX) 40 MG tablet, Take 1 tablet (40 mg total) by mouth 2 (two) times daily before a meal for 30 days, THEN 1 tablet (40 mg total) daily., Disp: 60 tablet, Rfl: 1   polyethylene glycol (MIRALAX / GLYCOLAX) 17 g packet, Take 17 g by mouth daily as needed (constipation). May take up to twice daily if once daily is ineffective, Disp: 20 each, Rfl: 0 Social History    Socioeconomic History   Marital status: Single    Spouse name: Not on file   Number of children: Not on file   Years of education: Not on file   Highest education level: Not on file  Occupational History   Not on file  Tobacco Use   Smoking status: Every Day    Packs/day: 0.25    Years: 26.00    Total pack years: 6.50    Types: Cigarettes   Smokeless tobacco: Never   Tobacco comments:    4-5 cig daily as of 10/07/2020.  Vaping Use   Vaping Use: Never used  Substance and Sexual Activity   Alcohol use: No   Drug use: No   Sexual activity: Never    Birth control/protection: None  Other Topics Concern   Not on file  Social History Narrative   Not on file   Social Determinants of Health   Financial Resource Strain: Not on file  Food Insecurity: No Food Insecurity (06/13/2022)   Hunger Vital Sign    Worried About Running Out of Food in the Last Year: Never true    Ran Out of Food in the Last Year: Never true  Transportation Needs: No Transportation Needs (06/13/2022)   PRAPARE - Hydrologist (Medical): No    Lack of Transportation (Non-Medical): No  Physical Activity: Not on file  Stress: Not on file  Social Connections: Not on file  Intimate Partner Violence: Not At Risk (06/13/2022)   Humiliation, Afraid, Rape, and Kick questionnaire    Fear of Current or Ex-Partner: No    Emotionally Abused: No    Physically Abused: No    Sexually Abused: No   Family History  Problem Relation Age of Onset   Diabetes Father    Heart disease Father    Heart disease Maternal Grandmother    Stroke Maternal Grandfather    Colon cancer Neg Hx    Esophageal cancer Neg Hx    Inflammatory bowel disease Neg Hx    Liver disease Neg Hx    Pancreatic cancer Neg Hx    Rectal cancer Neg Hx    Stomach cancer Neg Hx     Objective: Office vital signs reviewed. BP 130/79   Pulse 77   Temp 97.7 F (36.5 C)   Ht '5\' 10"'$  (1.778 m)   Wt 203 lb 12.8 oz (92.4 kg)    SpO2 99%   BMI 29.24 kg/m   Physical Examination:  General: Awake, alert, no jaundice.  Nontoxic, No acute distress Abdomen: Protuberant.  Mild tenderness palpation on the entire left side.  No rebound and no significant guarding.  Assessment/ Plan: 51 y.o. male   Acute on chronic pancreatitis ([place]) - Plan: oxyCODONE (OXY IR/ROXICODONE) 5 MG immediate release tablet  Had a second admission for acute on chronic pancreatitis.  I have given him a small supply of pain medication.  He has Zofran and Protonix on hand.  I also gave him a handout on pancreatic diet.  Keep follow-up visit with specialist for management of his stent placement.  He understands red flag signs and symptoms warranting further evaluation in the ER  The Narcotic Database has been reviewed.  There were no red flags.     No orders of the defined types were placed in this encounter.  No orders of the defined types were placed in this encounter.    [name], DO [facility] [phone]

## 2022-06-26 NOTE — Patient Instructions (Signed)
If you end up having a need for chronic pain medication, next step will be referral to pain specialist.  Let us know if this referral is needed.  Pancreatitis Eating Plan Pancreatitis is when your pancreas gets irritated and swollen (inflamed). The pancreas is a small gland behind your stomach. It helps your body digest food and regulate your blood sugar. Pancreatitis can affect how your body digests food, especially foods with fat. You may also have other symptoms such as pain in your abdomen (abdominal pain) or nausea. When you have pancreatitis, following a low-fat eating plan may help you manage symptoms and recover faster. Work with your health care provider or a dietitian to create an eating plan that is right for you. What are tips for following this plan? Reading food labels Use the information on food labels to help keep track of how much fat you eat: Check the serving size. Look for the amount of total fat in grams (g) in one serving. Low-fat foods have 3 g of fat or less per serving. Fat-free foods have 0.5 g of fat or less per serving. Keep track of how much fat you eat based on how many servings you eat. For example, if you eat two servings, the amount of fat you eat will be twice what is listed on the label. Shopping  Buy low-fat or nonfat foods, such as: Fresh, frozen, or canned fruits and vegetables. Grains, including pasta, bread, and rice. Lean meat, poultry, fish, and other protein foods. Low-fat or nonfat dairy. Avoid buying bakery products and other sweets made with whole milk, butter, and eggs. Avoid buying snack foods with added fat, such as anything with butter or cheese flavoring. Cooking Remove skin from poultry, and remove extra fat from meat. Limit the amount of fat and oil you use to 6 tsp (30 mL) or less per day. Cook using low-fat methods, such as boiling, broiling, grilling, steaming, or baking. Use spray oil to cook. Add fat-free chicken broth to add flavor  and moisture. Avoid adding cream to thicken soups or sauces. Use other thickeners such as corn starch or tomato paste. Meal planning  Eat a low-fat diet as told by your dietitian. For most people, this means having no more than 55-65 g of fat each day. Eat small, frequent meals throughout the day. For example, you may have 5 or 6 small meals instead of 3 large meals. Drink enough fluid to keep your urine pale yellow. Do not drink alcohol. Talk to your health care provider if you need help stopping. Limit how much caffeine you have, including black coffee, black and green tea, soft drinks with caffeine, and energy drinks. Plan to include a variety of foods in your diet. Include fruits, vegetables, whole grains and lean proteins, and low-fat or nonfat dairy. You need a balanced diet for good overall health. General information Let your health care provider or dietitian know if you have unplanned weight loss on this eating plan. You may be told to follow a clear liquid or soft food diet when symptoms come back, which is called a flare. Talk with your health care provider about how to manage your diet during symptoms of a flare. Take any vitamins or supplements as told by your health care provider. You may need to take: Extra vitamins that dissolve in fat (are fat soluble), such as vitamins A, D, E, and K. Nutritional supplements. Work with a Microbiologist, especially if you have other conditions such as obesity, osteoporosis, or  diabetes mellitus. Some people need extra treatments, such as: Pills or capsules to replace enzymes (oral pancreatic enzyme replacement therapy). Feedings through a tube in the stomach or intestine (enteral feedings). What foods should I avoid? Fruits Fried fruits. Fruits served with butter or cream. Vegetables Fried vegetables. Vegetables cooked with butter, cheese, or cream. Grains Biscuits, waffles, donuts, pastries, and croissants. Pies and cookies. Butter-flavored  popcorn. Regular crackers. Meats and other proteins Fatty cuts of meat. Poultry with skin. Organ meats. Precooked or cured meat, such as sausages or meat loaves. Whole eggs. Nuts and nut butters. Dairy Whole and 2% milk. Whole milk yogurt. Whole milk ice cream. Cream and half-and-half. Cheese, such as cream cheese. Sour cream. Beverages Wine, beer, and liquor. The items listed above may not be a complete list of foods and beverages you should avoid. Contact a dietitian for more information. Summary Pancreatitis can affect how your body digests food, especially foods with fat. When you have pancreatitis, it is recommended that you follow a low-fat eating plan to help you recover faster and manage symptoms. Do not drink alcohol. Limit the amount of caffeine you have, and drink enough fluid to keep your urine pale yellow. This information is not intended to replace advice given to you by your health care provider. Make sure you discuss any questions you have with your health care provider. Document Revised: 06/11/2021 Document Reviewed: 06/11/2021 Elsevier Patient Education  [place].

## 2022-07-01 ENCOUNTER — Ambulatory Visit (HOSPITAL_COMMUNITY): Payer: Medicaid Other

## 2022-07-13 ENCOUNTER — Ambulatory Visit (HOSPITAL_COMMUNITY)
Admission: RE | Admit: 2022-07-13 | Discharge: 2022-07-13 | Disposition: A | Discharge status: Home / Self Care | Payer: Medicaid Other | Source: Ambulatory Visit | Attending: Gastroenterology | Admitting: Gastroenterology

## 2022-07-13 ENCOUNTER — Encounter (HOSPITAL_COMMUNITY): Payer: Self-pay | Admitting: Radiology

## 2022-07-13 DIAGNOSIS — Z8719 Personal history of other diseases of the digestive system: Secondary | ICD-10-CM | POA: Insufficient documentation

## 2022-07-13 MED ORDER — IOHEXOL 300 MG/ML  SOLN
80.0000 mL | Freq: Once | INTRAMUSCULAR | Status: AC | PRN
  Administered 2022-07-13: 80 mL via INTRAVENOUS

## 2022-07-21 ENCOUNTER — Ambulatory Visit (INDEPENDENT_AMBULATORY_CARE_PROVIDER_SITE_OTHER): Payer: Medicaid Other | Admitting: Gastroenterology

## 2022-07-21 ENCOUNTER — Encounter: Payer: Self-pay | Admitting: Gastroenterology

## 2022-07-21 ENCOUNTER — Other Ambulatory Visit (INDEPENDENT_AMBULATORY_CARE_PROVIDER_SITE_OTHER): Payer: Medicaid Other

## 2022-07-21 VITALS — HR 74 | Ht 70.0 in | Wt 208.0 lb

## 2022-07-21 DIAGNOSIS — K863 Pseudocyst of pancreas: Secondary | ICD-10-CM

## 2022-07-21 DIAGNOSIS — K209 Esophagitis, unspecified without bleeding: Secondary | ICD-10-CM

## 2022-07-21 DIAGNOSIS — R748 Abnormal levels of other serum enzymes: Secondary | ICD-10-CM

## 2022-07-21 DIAGNOSIS — R1084 Generalized abdominal pain: Secondary | ICD-10-CM

## 2022-07-21 DIAGNOSIS — K861 Other chronic pancreatitis: Secondary | ICD-10-CM

## 2022-07-21 DIAGNOSIS — K8681 Exocrine pancreatic insufficiency: Secondary | ICD-10-CM

## 2022-07-21 DIAGNOSIS — K831 Obstruction of bile duct: Secondary | ICD-10-CM

## 2022-07-21 DIAGNOSIS — K8689 Other specified diseases of pancreas: Secondary | ICD-10-CM | POA: Diagnosis not present

## 2022-07-21 DIAGNOSIS — G8929 Other chronic pain: Secondary | ICD-10-CM

## 2022-07-21 LAB — CBC
HCT: 46.5 % (ref 39.0–52.0)
Hemoglobin: 16.2 g/dL (ref 13.0–17.0)
MCHC: 34.8 g/dL (ref 30.0–36.0)
MCV: 90 fl (ref 78.0–100.0)
Platelets: 249 10*3/uL (ref 150.0–400.0)
RBC: 5.16 Mil/uL (ref 4.22–5.81)
RDW: 13.4 % (ref 11.5–15.5)
WBC: 9 10*3/uL (ref 4.0–10.5)

## 2022-07-21 LAB — HEMOGLOBIN A1C: Hgb A1c MFr Bld: 6.8 % — ABNORMAL HIGH (ref 4.6–6.5)

## 2022-07-21 LAB — COMPREHENSIVE METABOLIC PANEL
ALT: 21 U/L (ref 0–53)
AST: 13 U/L (ref 0–37)
Albumin: 4.3 g/dL (ref 3.5–5.2)
Alkaline Phosphatase: 144 U/L — ABNORMAL HIGH (ref 39–117)
BUN: 11 mg/dL (ref 6–23)
CO2: 30 mEq/L (ref 19–32)
Calcium: 9.5 mg/dL (ref 8.4–10.5)
Chloride: 101 mEq/L (ref 96–112)
Creatinine, Ser: 0.91 mg/dL (ref 0.40–1.50)
GFR: 97.61 mL/min (ref >60.00)
Glucose, Bld: 116 mg/dL — ABNORMAL HIGH (ref 70–99)
Potassium: 4.4 mEq/L (ref 3.5–5.1)
Sodium: 140 mEq/L (ref 135–145)
Total Bilirubin: 0.6 mg/dL (ref 0.2–1.2)
Total Protein: 7.1 g/dL (ref 6.0–8.3)

## 2022-07-21 LAB — LIPASE: Lipase: 17 U/L (ref 11.0–59.0)

## 2022-07-21 MED ORDER — TRAMADOL HCL 50 MG PO TABS: 50.0000 mg | ORAL_TABLET | Freq: Two times a day (BID) | ORAL | 3 refills | Status: DC | PRN

## 2022-07-21 NOTE — H&P (View-Only) (Signed)
[facility] VISIT   Primary Care Provider Ronnie Doss [place], DO [place] [id number] [phone]  Patient Profile: Steve Romero is a 52 y.o. male with a pmh significant for bipolar disorder, schizoaffective disorder, hypertension, hyperlipidemia, OSA, complicated necrotizing pancreatitis (secondary to gallstone/post-ERCP pancreatitis with eventual smoldering complications of biliary obstruction and recurrent pseudocysts), chronic abdominal discomfort, colon polyps (TAs).  The patient presents to the [facility] for an evaluation and management of problem(s) noted below:  Problem List 1. Pancreatic pseudocyst   2. Pancreatic duct disruption   3. Exocrine pancreatic insufficiency   4. Chronic biliary pancreatitis (HCC)   5. Elevated alkaline phosphatase level   6. Biliary stricture   7. Chronic generalized abdominal pain   8. Esophagitis determined by endoscopy     History of Present Illness Please see prior notes for full details of HPI.  Interval History The patient returns for follow-up.  He is accompanied by his sister today.  Since I last saw the patient and placed an AXIOS stent for cystgastrostomy creation in the setting of his recurrent pseudocyst, he has done quite well.  His nausea and vomiting has improved significantly though he still has days where he will have some nausea depending on how much he is eating.  Patient has had decreased amount of abdominal pain also.  He is scheduled for me to remove his AXIOS stent next week.  He is repeat imaging study showed improvement overall in regards to the cyst itself.  There was central pancreatic body necrosis with residual enhancing tissue in the head and tail.  There was PD dilation towards the tail suggestive of communication to the pseudocyst and a persistent biliary dilatation with narrowing towards the distal bile duct but no evidence of any further sludge or stones  within the biliary tree.  The patient denies any fevers or chills.  He has actually gained weight since December.  He is still having pain and has used tramadol with some effectiveness that he still had available.  Opioids seem to help him more.  His bowel habits have not changed currently.  He is not noting any blood in his stools.  GI Review of Systems Positive as above Negative for dysphagia, odynophagia, alteration of bowel habits, hematochezia, melena  Review of Systems General: Denies fevers/chills/unintentional weight loss Cardiovascular: Denies chest pain Pulmonary: Denies shortness of breath Gastroenterological: See HPI Genitourinary: As per HPI Hematological: Denies easy bruising/bleeding Dermatological: Denies jaundice Psychological: Mood is stable   Medications Current Outpatient Medications  Medication Sig Dispense Refill   lipase/protease/amylase (CREON) 36000 UNITS CPEP capsule Take 2 capsules (72,000 Units total) by mouth 3 (three) times daily with meals. 270 capsule 1   ondansetron (ZOFRAN-ODT) 4 MG disintegrating tablet Take 1 tablet (4 mg total) by mouth every 8 (eight) hours as needed for nausea or vomiting. 60 tablet 1   polyethylene glycol (MIRALAX / GLYCOLAX) 17 g packet Take 17 g by mouth daily as needed (constipation). May take up to twice daily if once daily is ineffective 20 each 0   traMADol (ULTRAM) 50 MG tablet Take 1 tablet (50 mg total) by mouth every 12 (twelve) hours as needed. 30 tablet 3   No current facility-administered medications for this visit.    Allergies Allergies  Allergen Reactions   Desipramine Nausea Only and Rash    Histories Past Medical History:  Diagnosis Date   Acute pancreatitis without necrosis or infection, unspecified    AKI (acute kidney  injury) ([place]) 05/17/2022   Anxiety 10/2014   Ascites    Bipolar disorder [facility]) age 33   COPD (chronic obstructive pulmonary disease) ([place]) 2016   Depression age 32   Enlarged  prostate    Hyperlipidemia 2016   Hypertension 2013   Nausea vomiting and diarrhea 06/15/2022   Schizoaffective disorder ([place]) 11/13/2014   Sleep apnea    Does not wear c-pap, sleeps in sitting up position per pt   Thyroid disease 11/2014   Past Surgical History:  Procedure Laterality Date   BALLOON DILATION N/A 11/07/2020   Procedure: BALLOON DILATION;  Surgeon: Irving Copas., MD;  Location: [facility] ENDOSCOPY;  Service: Gastroenterology;  Laterality: N/A;   BALLOON DILATION N/A 06/26/2021   Procedure: BALLOON DILATION;  Surgeon: Rush Landmark Telford Nab., MD;  Location: [place];  Service: Gastroenterology;  Laterality: N/A;   BILIARY DILATION  07/24/2021   Procedure: BILIARY DILATION;  Surgeon: Rush Landmark Telford Nab., MD;  Location: [place];  Service: Gastroenterology;;   BILIARY DILATION  05/22/2022   Procedure: BILIARY DILATION;  Surgeon: Irving Copas., MD;  Location: [facility];  Service: Gastroenterology;;   BILIARY DILATION  06/16/2022   Procedure: GASTROSTOMY DILATION;  Surgeon: Irving Copas., MD;  Location: [facility];  Service: Gastroenterology;;   BILIARY STENT PLACEMENT N/A 10/10/2020   Procedure: BILIARY STENT PLACEMENT;  Surgeon: Rogene Houston, MD;  [facility]: [facility];  Service: Endoscopy;  Laterality: N/A;   BILIARY STENT PLACEMENT  11/09/2020   Procedure: BILIARY STENT PLACEMENT;  Surgeon: Rush Landmark Telford Nab., MD;  Location: [place];  Service: Gastroenterology;;   BILIARY STENT PLACEMENT  11/07/2020   Procedure: BILIARY STENT PLACEMENT;  Surgeon: Irving Copas., MD;  Location: [place];  Service: Gastroenterology;;   BILIARY STENT PLACEMENT  11/11/2020   Procedure: BILIARY STENT PLACEMENT;  Surgeon: Irving Copas., MD;  Location: [place];  Service: Gastroenterology;;   BILIARY STENT PLACEMENT  11/14/2020   Procedure: BILIARY STENT PLACEMENT;  Surgeon: Irving Copas., MD;  Location: [place];  Service: Gastroenterology;;   BILIARY STENT PLACEMENT N/A 01/29/2021   Procedure: BILIARY STENT PLACEMENT;  Surgeon: Irving Copas., MD;  Location: [facility];  Service: Gastroenterology;  Laterality: N/A;   BILIARY STENT PLACEMENT  06/26/2021   Procedure: BILIARY STENT PLACEMENT;  Surgeon: Rush Landmark Telford Nab., MD;  Location: [place];  Service: Gastroenterology;;   BILIARY STENT PLACEMENT  07/24/2021   Procedure: BILIARY STENT PLACEMENT;  Surgeon: Irving Copas., MD;  Location: [place];  Service: Gastroenterology;;   BIOPSY  11/07/2020   Procedure: BIOPSY;  Surgeon: Irving Copas., MD;  Location: [place];  Service: Gastroenterology;;   BIOPSY  12/20/2020   Procedure: BIOPSY;  Surgeon: Irving Copas., MD;  Location: [facility];  Service: Gastroenterology;;   BIOPSY  06/26/2021   Procedure: BIOPSY;  Surgeon: Irving Copas., MD;  Location: [place];  Service: Gastroenterology;;   CARPAL TUNNEL RELEASE Left 02/21/2016   Procedure: CARPAL TUNNEL RELEASE;  Surgeon: Carole Civil, MD;  [facility];  Service: Orthopedics;  Laterality: Left;   CHOLECYSTECTOMY N/A 10/09/2020   Procedure: LAPAROSCOPIC CHOLECYSTECTOMY;  Surgeon: Virl Cagey, MD;  [facility];  Service: General;  Laterality: N/A;   CYST ENTEROSTOMY N/A 11/07/2020   Procedure: CYST ENTEROSTOMY;  Surgeon: Irving Copas., MD;  Location: [place];  Service: Gastroenterology;  Laterality: N/A;   CYST GASTROSTOMY  11/11/2020   Procedure: CYST NECROSECTOMY;  Surgeon: Rush Landmark Telford Nab., MD;  Location: [facility]  ENDOSCOPY;  Service: Gastroenterology;;   CYST GASTROSTOMY  11/14/2020   Procedure: CYST NECROSECTOMY;  Surgeon: Rush Landmark Telford Nab., MD;  Location: [place];  Service: Gastroenterology;;   CYST GASTROSTOMY  06/16/2022   Procedure: CYST GASTROSTOMY;  Surgeon: Irving Copas., MD;  Location: [facility] ENDOSCOPY;   Service: Gastroenterology;;   CYST REMOVAL HAND     CYSTOSCOPY  06/26/2021   Procedure: CYSTOGASTROSTOMY;  Surgeon: Mansouraty, Telford Nab., MD;  Location: [facility] ENDOSCOPY;  Service: Gastroenterology;;   ENDOSCOPIC RETROGRADE CHOLANGIOPANCREATOGRAPHY (ERCP) WITH PROPOFOL N/A 11/09/2020   Procedure: ENDOSCOPIC RETROGRADE CHOLANGIOPANCREATOGRAPHY (ERCP) WITH PROPOFOL;  Surgeon: Irving Copas., MD;  Location: [place];  Service: Gastroenterology;  Laterality: N/A;   ENDOSCOPIC RETROGRADE CHOLANGIOPANCREATOGRAPHY (ERCP) WITH PROPOFOL N/A 12/20/2020   Procedure: ENDOSCOPIC RETROGRADE CHOLANGIOPANCREATOGRAPHY (ERCP) WITH PROPOFOL;  Surgeon: Rush Landmark Telford Nab., MD;  Location: [facility];  Service: Gastroenterology;  Laterality: N/A;   ENDOSCOPIC RETROGRADE CHOLANGIOPANCREATOGRAPHY (ERCP) WITH PROPOFOL N/A 01/29/2021   Procedure: ENDOSCOPIC RETROGRADE CHOLANGIOPANCREATOGRAPHY (ERCP) WITH PROPOFOL;  Surgeon: Rush Landmark Telford Nab., MD;  Location: [facility];  Service: Gastroenterology;  Laterality: N/A;   ENDOSCOPIC RETROGRADE CHOLANGIOPANCREATOGRAPHY (ERCP) WITH PROPOFOL N/A 07/24/2021   Procedure: ENDOSCOPIC RETROGRADE CHOLANGIOPANCREATOGRAPHY (ERCP) WITH PROPOFOL;  Surgeon: Rush Landmark Telford Nab., MD;  Location: [place];  Service: Gastroenterology;  Laterality: N/A;   ERCP N/A 10/10/2020   Procedure: ENDOSCOPIC RETROGRADE CHOLANGIOPANCREATOGRAPHY (ERCP);  Surgeon: Rogene Houston, MD;  [facility];  Service: Endoscopy;  Laterality: N/A;   ERCP     ERCP N/A 05/22/2022   Procedure: ENDOSCOPIC RETROGRADE CHOLANGIOPANCREATOGRAPHY (ERCP);  Surgeon: Irving Copas., MD;  Location: [facility];  Service: Gastroenterology;  Laterality: N/A;   ESOPHAGOGASTRODUODENOSCOPY N/A 11/11/2020   Procedure: ESOPHAGOGASTRODUODENOSCOPY (EGD);  Surgeon: Irving Copas., MD;  Location: [place];  Service: Gastroenterology;  Laterality: N/A;   ESOPHAGOGASTRODUODENOSCOPY N/A  11/14/2020   Procedure: ESOPHAGOGASTRODUODENOSCOPY (EGD);  Surgeon: Irving Copas., MD;  Location: [place];  Service: Gastroenterology;  Laterality: N/A;   ESOPHAGOGASTRODUODENOSCOPY (EGD) WITH PROPOFOL N/A 11/09/2020   Procedure: ESOPHAGOGASTRODUODENOSCOPY (EGD) WITH PROPOFOL;  Surgeon: Rush Landmark Telford Nab., MD;  Location: [place];  Service: Gastroenterology;  Laterality: N/A;   ESOPHAGOGASTRODUODENOSCOPY (EGD) WITH PROPOFOL N/A 11/07/2020   Procedure: ESOPHAGOGASTRODUODENOSCOPY (EGD) WITH PROPOFOL;  Surgeon: Rush Landmark Telford Nab., MD;  Location: [place];  Service: Gastroenterology;  Laterality: N/A;   ESOPHAGOGASTRODUODENOSCOPY (EGD) WITH PROPOFOL N/A 12/20/2020   Procedure: ESOPHAGOGASTRODUODENOSCOPY (EGD) WITH PROPOFOL;  Surgeon: Rush Landmark Telford Nab., MD;  [facility];  Service: Gastroenterology;  Laterality: N/A;   ESOPHAGOGASTRODUODENOSCOPY (EGD) WITH PROPOFOL N/A 06/26/2021   Procedure: ESOPHAGOGASTRODUODENOSCOPY (EGD) WITH PROPOFOL;  Surgeon: Rush Landmark Telford Nab., MD;  Location: [place];  Service: Gastroenterology;  Laterality: N/A;   ESOPHAGOGASTRODUODENOSCOPY (EGD) WITH PROPOFOL N/A 07/24/2021   Procedure: ESOPHAGOGASTRODUODENOSCOPY (EGD) WITH PROPOFOL;  Surgeon: Rush Landmark Telford Nab., MD;  Location: [place];  Service: Gastroenterology;  Laterality: N/A;  AXIOS STENT   ESOPHAGOGASTRODUODENOSCOPY (EGD) WITH PROPOFOL N/A 05/22/2022   Procedure: ESOPHAGOGASTRODUODENOSCOPY (EGD) WITH PROPOFOL;  Surgeon: Rush Landmark Telford Nab., MD;  [facility];  Service: Gastroenterology;  Laterality: N/A;   ESOPHAGOGASTRODUODENOSCOPY (EGD) WITH PROPOFOL N/A 06/16/2022   Procedure: ESOPHAGOGASTRODUODENOSCOPY (EGD) WITH PROPOFOL;  Surgeon: Rush Landmark Telford Nab., MD;  Location[facility];  Service: Gastroenterology;  Laterality: N/A;   EUS  11/14/2020   Procedure: UPPER ENDOSCOPIC ULTRASOUND (EUS) LINEAR;  Surgeon: Irving Copas.,  MD;  Location: [facility] ENDOSCOPY;  Service: Gastroenterology;;   EUS N/A 06/26/2021   Procedure: UPPER ENDOSCOPIC ULTRASOUND (EUS) RADIAL;  Surgeon: Justice Britain  Brooke Bonito., MD;  Location: [place];  Service: Gastroenterology;  Laterality: N/A;   EUS N/A 05/22/2022   Procedure: UPPER ENDOSCOPIC ULTRASOUND (EUS) LINEAR;  Surgeon: Irving Copas., MD;  [facility];  Service: Gastroenterology;  Laterality: N/A;   EUS N/A 06/16/2022   Procedure: UPPER ENDOSCOPIC ULTRASOUND (EUS) LINEAR;  Surgeon: Irving Copas., MD;  [facility];  Service: Gastroenterology;  Laterality: N/A;   FINE NEEDLE ASPIRATION  05/22/2022   Procedure: FINE NEEDLE ASPIRATION;  Surgeon: Rush Landmark Telford Nab., MD;  Location[facility];  Service: Gastroenterology;;   GASTROINTESTINAL STENT REMOVAL  12/20/2020   Procedure: GASTROINTESTINAL STENT REMOVAL;  Surgeon: Irving Copas., MD;  Location: [facility];  Service: Gastroenterology;;  cyst gastrostomy stent and double pig tail stents x2 removed   HERNIA REPAIR Left 2002   groin   INGUINAL HERNIA REPAIR Left 04/05/2017   Procedure: RECURRENT HERNIA REPAIR INGUINAL ADULT WITH MESH;  Surgeon: [name], MD;  [facility];  Service: General;  Laterality: Left;   MASS EXCISION Right 04/29/2020   Procedure: EXCISION MASS RIGHT WRIST;  Surgeon: [name], MD;  Location: [place];  Service: Orthopedics;  Laterality: Right;   MASS EXCISION Right 10/09/2020   Procedure: EXCISION MASS, ABDOMINAL WALL, 2CM;  Surgeon: Virl Cagey, MD;  Location: [facility];  Service: General;  Laterality: Right;   PANCREATIC STENT PLACEMENT  06/26/2021   Procedure: AXIOS STENT PLACEMENT;  Surgeon: Irving Copas., MD;  Location: [place];  Service: Gastroenterology;;   PANCREATIC STENT PLACEMENT  06/16/2022   Procedure: PANCREATIC STENT PLACEMENT;  Surgeon: Irving Copas., MD;  Location: [facility];   Service: Gastroenterology;;   REMOVAL OF STONES  12/20/2020   Procedure: REMOVAL OF STONES;  Surgeon: Irving Copas., MD;  Location: [facility];  Service: Gastroenterology;;   REMOVAL OF STONES  07/24/2021   Procedure: REMOVAL OF STONES;  Surgeon: Irving Copas., MD;  Location: [place];  Service: Gastroenterology;;   REMOVAL OF STONES  05/22/2022   Procedure: REMOVAL OF STONES;  Surgeon: Irving Copas., MD;  Location: [facility];  Service: Gastroenterology;;   Joan Mayans N/A 10/10/2020   Procedure: Joan Mayans;  Surgeon: Rogene Houston, MD;  [facility]: [facility];  Service: Endoscopy;  Laterality: N/A;   SPHINCTEROTOMY  07/24/2021   Procedure: SPHINCTEROTOMY;  Surgeon: Mansouraty, Telford Nab., MD;  Location: [place];  Service: Gastroenterology;;   Bess Kinds CHOLANGIOSCOPY N/A 12/20/2020   Procedure: DDUKGURK CHOLANGIOSCOPY;  Surgeon: Irving Copas., MD;  [facility]: [facility];  Service: Gastroenterology;  Laterality: N/A;   SPYGLASS CHOLANGIOSCOPY N/A 07/24/2021   Procedure: SPYGLASS CHOLANGIOSCOPY;  Surgeon: Irving Copas., MD;  Location: [place];  Service: Gastroenterology;  Laterality: N/A;   STENT REMOVAL  11/09/2020   Procedure: STENT REMOVAL;  Surgeon: Irving Copas., MD;  Location: [place];  Service: Gastroenterology;;   Lavell Islam REMOVAL  11/11/2020   Procedure: STENT REMOVAL;  Surgeon: Irving Copas., MD;  Location: [place];  Service: Gastroenterology;;   Lavell Islam REMOVAL  11/14/2020   Procedure: STENT REMOVAL;  Surgeon: Irving Copas., MD;  Location: [place];  Service: Gastroenterology;;   Lavell Islam REMOVAL  12/20/2020   Procedure: STENT REMOVAL;  Surgeon: Irving Copas., MD;  Location[facility];  Service: Gastroenterology;;  biliary x2   STENT REMOVAL  07/24/2021   Procedure: AXIOS STENT REMOVAL;  Surgeon: Irving Copas., MD;  Location: [place];  Service:  Gastroenterology;;   UPPER ESOPHAGEAL ENDOSCOPIC ULTRASOUND (EUS) N/A 11/07/2020  Procedure: UPPER ESOPHAGEAL ENDOSCOPIC ULTRASOUND (EUS);  Surgeon: Irving Copas., MD;  Location: [place];  Service: Gastroenterology;  Laterality: N/A;   UPPER GASTROINTESTINAL ENDOSCOPY     WOUND DEBRIDEMENT  11/09/2020   Procedure: CYST NECROSECTOMY;  Surgeon: Mansouraty, Telford Nab., MD;  Location: [facility] ENDOSCOPY;  Service: Gastroenterology;;   Social History   Socioeconomic History   Marital status: Single    Spouse name: Not on file   Number of children: 0   Years of education: Not on file   Highest education level: Not on file  Occupational History   Not on file  Tobacco Use   Smoking status: Every Day    Packs/day: 0.25    Years: 26.00    Total pack years: 6.50    Types: Cigarettes   Smokeless tobacco: Never   Tobacco comments:    4-5 cig daily as of 10/07/2020.  Vaping Use   Vaping Use: Never used  Substance and Sexual Activity   Alcohol use: No   Drug use: No   Sexual activity: Never    Birth control/protection: None  Other Topics Concern   Not on file  Social History Narrative   Not on file   Social Determinants of Health   Financial Resource Strain: Not on file  Food Insecurity: No Food Insecurity (06/13/2022)   Hunger Vital Sign    Worried About Running Out of Food in the Last Year: Never true    Ran Out of Food in the Last Year: Never true  Transportation Needs: No Transportation Needs (06/13/2022)   PRAPARE - Hydrologist (Medical): No    Lack of Transportation (Non-Medical): No  Physical Activity: Not on file  Stress: Not on file  Social Connections: Not on file  Intimate Partner Violence: Not At Risk (06/13/2022)   Humiliation, Afraid, Rape, and Kick questionnaire    Fear of Current or Ex-Partner: No    Emotionally Abused: No    Physically Abused: No    Sexually Abused: No   Family History  Problem Relation Age of Onset    Diabetes Father    Heart disease Father    Heart disease Maternal Grandmother    Stroke Maternal Grandfather    Colon cancer Neg Hx    Esophageal cancer Neg Hx    Inflammatory bowel disease Neg Hx    Liver disease Neg Hx    Pancreatic cancer Neg Hx    Rectal cancer Neg Hx    Stomach cancer Neg Hx    I have reviewed his medical, social, and family history in detail and updated the electronic medical record as necessary.    PHYSICAL EXAMINATION  Pulse 74   Ht '5\' 10"'$  (1.778 m)   Wt 208 lb (94.3 kg)   BMI 29.84 kg/m  Wt Readings from Last 3 Encounters:  07/21/22 208 lb (94.3 kg)  06/26/22 203 lb 12.8 oz (92.4 kg)  06/16/22 197 lb (89.4 kg)  GEN: NAD, appears stated age, doesn't appear chronically ill PSYCH: Cooperative, without pressured speech EYE: Sclera anicteric ENT: MMM CV: Nontachycardic RESP: No audible wheezing GI: NABS, soft, protuberant abdomen, TTP in upper abdomen, no rebound MSK/EXT: No significant lower extremity edema SKIN: No jaundice NEURO:  Alert & Oriented x 3, no focal deficits   REVIEW OF DATA  I reviewed the following data at the time of this encounter:  GI Procedures and Studies  December 2023 upper EUS Impression:  EGD Impression:                           - LA Grade C erosive esophagitis with no bleeding.                           - Z-line irregular, 41 cm from the incisors.                           - Extrinsic compression in the gastric antrum.                           - Erythematous mucosa in the stomach.                           - No gross lesions in the duodenal bulb, in the                            first portion of the duodenum and in the second                            portion of the duodenum (only seen after EUS                            completed).                           EUS Impression:                           - A cystic lesion was seen in the genu of the                            pancreas and pancreatic  body. Tissue has not been                            obtained. However, the endosonographic appearance                            is consistent with a pancreatic pseudocyst. AXIOS                            cystgastrostomy created. Dilated tract. Aspiration                            of 500 mL of fluid.                           - Pancreatic parenchymal abnormalities consisting                            of atrophy were noted in the pancreatic body and                            pancreatic tail.                           -  The pancreatic duct had a dilated endosonographic                            appearance in the body of the pancreas and tail of                            the pancreas.                           - Cystogastrostomy was performed.  November 2023 EUS Impression:               EGD Impression:                           - No gross lesions in the entire esophagus. Z-line                            irregular, 41 cm from the incisors.                           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                           - Extrinsic impression/compression in the gastric                            antrum.                           - Needed pediatric colonoscope to traverse into the                            duodenum.                           - No gross lesions in the duodenal bulb, in the                            first portion of the duodenum and in the second                            portion of the duodenum.                           EUS Impression:                           - A cystic lesion was seen in the genu of the                            pancreas. Tissue has not been obtained. However,                            the endosonographic appearance is consistent with a  pancreatic pseudocyst. Decision made due to the                            small size to perform an aspiration rather than                            cystgastrostomy.  Fine needle aspiration for fluid                            performed.                           - Pancreatic parenchymal abnormalities consisting                            of atrophy, lobularity and hyperechoic strands were                            noted in the entire pancreas.                           - The pancreatic duct had a dilated endosonographic                            appearance in the genu of the pancreas and body of                            the pancreas. The pancreatic duct measured up to 5                            mm in diameter in these regions.                           - There was dilation in the common bile duct and in                            the common hepatic duct which measured up to 14 mm.                           - No malignant-appearing lymph nodes were                            visualized in the celiac region (level 20),                            peripancreatic region and porta hepatis region.  November 2023 ERCP Impression:               - The major papilla appeared edematous. Prior                            biliary sphincterotomy appeared open but was  slightly narrowed.                           - The fluoroscopic examination was suspicious for                            sludge.                           - Distal narrowing noted in the duct. Balloon                            sphincteroplasty performed.                           - The biliary tree was swept and sludge was found.                           - An irregularity was found in the ventral                            pancreatic duct in the head of the pancreas and I                            could not traverse deeply into the proximal                            pancreatic duct - I am concerned as I had been                            previously of duct disruption (failed prior                            pancreatic evaluation at [facility] as well in the  past).   Laboratory Studies  Reviewed those in epic  Imaging Studies  March 2023 CTAP IMPRESSION: Interval removal of common bile duct stents when compare with the prior CT examination. Mild residual biliary ductal dilatation is seen in the intrahepatic and extrahepatic ductal system. Chronic changes about the pancreas with some scarring and thinning of the mid body of the pancreas these changes are similar to that seen on prior exam and likely related to prior interventions. No residual pseudocyst is seen. No acute pancreatitis is noted. Fluid in the small bowel and colon which may be related to a diarrheal state. Clinical correlation is recommended. No other focal abnormality is noted.   ASSESSMENT  Mr. Stemm is a 52 y.o. male with a pmh significant for bipolar disorder, schizoaffective disorder, hypertension, hyperlipidemia, OSA, complicated necrotizing pancreatitis (secondary to gallstone/post-ERCP pancreatitis with eventual smoldering complications of biliary obstruction and recurrent pseudocysts), chronic abdominal discomfort, colon polyps (TAs).  The patient is seen today for evaluation and management of:  1. Pancreatic pseudocyst   2. Pancreatic duct disruption   3. Exocrine pancreatic insufficiency   4. Chronic biliary pancreatitis (HCC)   5. Elevated alkaline phosphatase level   6. Biliary stricture   7. Chronic generalized abdominal pain   8. Esophagitis determined by endoscopy    The patient is stable  hemodynamically.  Clinically, he is much improved since his endoscopic cystgastrostomy.  The patient however, is facing a difficult situation overall.  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  I have tried as well as [facility]oscopy  without success at traversing the distal pancreatic duct stricture in an attempt of trying to help this patient endoscopically have less issues, but we have not been able to intervene unfortunately.  At this point, I greatly appreciate our pancreaticobiliary surgeon, Dr. Zenia Resides, for evaluating the patient (he is scheduled for next week) to discuss other options that may be available.  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  A distal pancreatectomy could be considered as well.  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  A Whipple procedure with biliary bypass could be considered as well but again this would be high risk for him.  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  Lets see what options are available from our surgical providers.  Let us plan to help the patient with tramadol for pain management at this time.  A referral to chronic pain for opioid therapy could be considered in the future.  I will see him next week for his AXIOS stent pull.  The risks and benefits of endoscopic evaluation were discussed with the patient; these include but are not limited to the risk of perforation, infection, bleeding, missed lesions, lack of diagnosis, severe illness requiring hospitalization, as well as anesthesia and sedation related illnesses.  The patient and/or family is agreeable to proceed.  All patient questions were answered to the best of my ability, and the patient agrees to the aforementioned plan of action with  follow-up as indicated.   PLAN  Laboratories as outlined below Proceed with scheduled EGD for AXIOS cystgastrostomy stent pull Continue Creon - 2 capsules with each meal - 1 capsule with each snack Continue daily PPI Low-fat diet Tobacco cessation recommended again Follow-up with pancreaticobiliary surgery as scheduled next week to discuss pseudocyst surgical gastrostomy/enterostomy or distal pancreatectomy versus Whipple versus other procedures Hemoglobin A1c to be evaluated and if increasing may need diabetes control with primary care provider Tramadol 50 mg every 12 hours as needed   Orders Placed This Encounter  Procedures   CBC   Comp Met (CMET)   Lipase   HgB A1c    New Prescriptions   TRAMADOL (ULTRAM) 50 MG TABLET    Take 1 tablet (50 mg total) by mouth every 12 (twelve) hours as needed.   Modified Medications   No medications on file    Planned Follow Up No follow-ups on file.   Total Time in Face-to-Face and in Coordination of Care for patient including independent/personal interpretation/review of prior testing, medical history, examination, medication adjustment, communicating results with the patient directly, and documentation with the  EHR is 30 minutes.   Justice Britain, MD [facility] Advanced Endoscopy Office # [phone]

## 2022-07-21 NOTE — Patient Instructions (Signed)
Your provider has requested that you go to the basement level for lab work before leaving today. Press "B" on the elevator. The lab is located at the first door on the left as you exit the elevator.  We have sent the following medications to your pharmacy for you to pick up at your convenience: Tramadol   _______________________________________________________  If your blood pressure at your visit was 140/90 or greater, please contact your primary care physician to follow up on this.  _______________________________________________________  If you are age 80 or older, your body mass index should be between 23-30. Your Body mass index is 29.84 kg/m. If this is out of the aforementioned range listed, please consider follow up with your Primary Care Provider.  If you are age 17 or younger, your body mass index should be between 19-25. Your Body mass index is 29.84 kg/m. If this is out of the aformentioned range listed, please consider follow up with your Primary Care Provider.   ________________________________________________________  The [facility] providers would like to encourage you to use [facility] to communicate with providers for non-urgent requests or questions.  Due to long hold times on the telephone, sending your provider a message by [facility] may be a faster and more efficient way to get a response.  Please allow 48 business hours for a response.  Please remember that this is for non-urgent requests.  _______________________________________________________  Thank you for choosing me and [facility].  Dr. Rush Landmark

## 2022-07-21 NOTE — Progress Notes (Signed)
[place] VISIT   Primary Care Provider Ronnie Doss [place], DO [place] [address] [phone]  Patient Profile: Steve Romero is a 52 y.o. male with a pmh significant for bipolar disorder, schizoaffective disorder, hypertension, hyperlipidemia, OSA, complicated necrotizing pancreatitis (secondary to gallstone/post-ERCP pancreatitis with eventual smoldering complications of biliary obstruction and recurrent pseudocysts), chronic abdominal discomfort, colon polyps (TAs).  The patient presents to the [facility] for an evaluation and management of problem(s) noted below:  Problem List 1. Pancreatic pseudocyst   2. Pancreatic duct disruption   3. Exocrine pancreatic insufficiency   4. Chronic biliary pancreatitis (HCC)   5. Elevated alkaline phosphatase level   6. Biliary stricture   7. Chronic generalized abdominal pain   8. Esophagitis determined by endoscopy     History of Present Illness Please see prior notes for full details of HPI.  Interval History The patient returns for follow-up.  He is accompanied by his sister today.  Since I last saw the patient and placed an AXIOS stent for cystgastrostomy creation in the setting of his recurrent pseudocyst, he has done quite well.  His nausea and vomiting has improved significantly though he still has days where he will have some nausea depending on how much he is eating.  Patient has had decreased amount of abdominal pain also.  He is scheduled for me to remove his AXIOS stent next week.  He is repeat imaging study showed improvement overall in regards to the cyst itself.  There was central pancreatic body necrosis with residual enhancing tissue in the head and tail.  There was PD dilation towards the tail suggestive of communication to the pseudocyst and a persistent biliary dilatation with narrowing towards the distal bile duct but no evidence of any further sludge or stones  within the biliary tree.  The patient denies any fevers or chills.  He has actually gained weight since December.  He is still having pain and has used tramadol with some effectiveness that he still had available.  Opioids seem to help him more.  His bowel habits have not changed currently.  He is not noting any blood in his stools.  GI Review of Systems Positive as above Negative for dysphagia, odynophagia, alteration of bowel habits, hematochezia, melena  Review of Systems General: Denies fevers/chills/unintentional weight loss Cardiovascular: Denies chest pain Pulmonary: Denies shortness of breath Gastroenterological: See HPI Genitourinary: As per HPI Hematological: Denies easy bruising/bleeding Dermatological: Denies jaundice Psychological: Mood is stable   Medications Current Outpatient Medications  Medication Sig Dispense Refill   lipase/protease/amylase (CREON) 36000 UNITS CPEP capsule Take 2 capsules (72,000 Units total) by mouth 3 (three) times daily with meals. 270 capsule 1   ondansetron (ZOFRAN-ODT) 4 MG disintegrating tablet Take 1 tablet (4 mg total) by mouth every 8 (eight) hours as needed for nausea or vomiting. 60 tablet 1   polyethylene glycol (MIRALAX / GLYCOLAX) 17 g packet Take 17 g by mouth daily as needed (constipation). May take up to twice daily if once daily is ineffective 20 each 0   traMADol (ULTRAM) 50 MG tablet Take 1 tablet (50 mg total) by mouth every 12 (twelve) hours as needed. 30 tablet 3   No current facility-administered medications for this visit.    Allergies Allergies  Allergen Reactions   Desipramine Nausea Only and Rash    Histories Past Medical History:  Diagnosis Date   Acute pancreatitis without necrosis or infection, unspecified    AKI (acute kidney  injury) ([place]) 05/17/2022   Anxiety 10/2014   Ascites    Bipolar disorder [facility]) age 23   COPD (chronic obstructive pulmonary disease) ([place]) 2016   Depression age 30   Enlarged  prostate    Hyperlipidemia 2016   Hypertension 2013   Nausea vomiting and diarrhea 06/15/2022   Schizoaffective disorder ([place]) 11/13/2014   Sleep apnea    Does not wear c-pap, sleeps in sitting up position per pt   Thyroid disease 11/2014   Past Surgical History:  Procedure Laterality Date   BALLOON DILATION N/A 11/07/2020   Procedure: BALLOON DILATION;  Surgeon: Irving Copas., MD;  Location: [facility] ENDOSCOPY;  Service: Gastroenterology;  Laterality: N/A;   BALLOON DILATION N/A 06/26/2021   Procedure: BALLOON DILATION;  Surgeon: Rush Landmark Telford Nab., MD;  Location: [place];  Service: Gastroenterology;  Laterality: N/A;   BILIARY DILATION  07/24/2021   Procedure: BILIARY DILATION;  Surgeon: Rush Landmark Telford Nab., MD;  Location: [place];  Service: Gastroenterology;;   BILIARY DILATION  05/22/2022   Procedure: BILIARY DILATION;  Surgeon: Irving Copas., MD;  Location: [facility];  Service: Gastroenterology;;   BILIARY DILATION  06/16/2022   Procedure: GASTROSTOMY DILATION;  Surgeon: Irving Copas., MD;  Location: [facility];  Service: Gastroenterology;;   BILIARY STENT PLACEMENT N/A 10/10/2020   Procedure: BILIARY STENT PLACEMENT;  Surgeon: Rogene Houston, MD;  [facility];  Service: Endoscopy;  Laterality: N/A;   BILIARY STENT PLACEMENT  11/09/2020   Procedure: BILIARY STENT PLACEMENT;  Surgeon: Rush Landmark Telford Nab., MD;  Location: [place];  Service: Gastroenterology;;   BILIARY STENT PLACEMENT  11/07/2020   Procedure: BILIARY STENT PLACEMENT;  Surgeon: Irving Copas., MD;  Location: [place];  Service: Gastroenterology;;   BILIARY STENT PLACEMENT  11/11/2020   Procedure: BILIARY STENT PLACEMENT;  Surgeon: Irving Copas., MD;  Location: [place];  Service: Gastroenterology;;   BILIARY STENT PLACEMENT  11/14/2020   Procedure: BILIARY STENT PLACEMENT;  Surgeon: Irving Copas., MD;  Location: [place];  Service: Gastroenterology;;   BILIARY STENT PLACEMENT N/A 01/29/2021   Procedure: BILIARY STENT PLACEMENT;  Surgeon: Irving Copas., MD;  Location: [facility];  Service: Gastroenterology;  Laterality: N/A;   BILIARY STENT PLACEMENT  06/26/2021   Procedure: BILIARY STENT PLACEMENT;  Surgeon: Rush Landmark Telford Nab., MD;  Location: [place];  Service: Gastroenterology;;   BILIARY STENT PLACEMENT  07/24/2021   Procedure: BILIARY STENT PLACEMENT;  Surgeon: Irving Copas., MD;  Location: [place];  Service: Gastroenterology;;   BIOPSY  11/07/2020   Procedure: BIOPSY;  Surgeon: Irving Copas., MD;  Location: [place];  Service: Gastroenterology;;   BIOPSY  12/20/2020   Procedure: BIOPSY;  Surgeon: Irving Copas., MD;  Location: [facility];  Service: Gastroenterology;;   BIOPSY  06/26/2021   Procedure: BIOPSY;  Surgeon: Irving Copas., MD;  Location: [place];  Service: Gastroenterology;;   CARPAL TUNNEL RELEASE Left 02/21/2016   Procedure: CARPAL TUNNEL RELEASE;  Surgeon: Carole Civil, MD;  [facility];  Service: Orthopedics;  Laterality: Left;   CHOLECYSTECTOMY N/A 10/09/2020   Procedure: LAPAROSCOPIC CHOLECYSTECTOMY;  Surgeon: Virl Cagey, MD;  [facility];  Service: General;  Laterality: N/A;   CYST ENTEROSTOMY N/A 11/07/2020   Procedure: CYST ENTEROSTOMY;  Surgeon: Irving Copas., MD;  Location: [place];  Service: Gastroenterology;  Laterality: N/A;   CYST GASTROSTOMY  11/11/2020   Procedure: CYST NECROSECTOMY;  Surgeon: Rush Landmark Telford Nab., MD;  Location: [facility]  ENDOSCOPY;  Service: Gastroenterology;;   CYST GASTROSTOMY  11/14/2020   Procedure: CYST NECROSECTOMY;  Surgeon: Rush Landmark Telford Nab., MD;  Location: [place];  Service: Gastroenterology;;   CYST GASTROSTOMY  06/16/2022   Procedure: CYST GASTROSTOMY;  Surgeon: Irving Copas., MD;  Location: [facility]COPY;   Service: Gastroenterology;;   CYST REMOVAL HAND     CYSTOSCOPY  06/26/2021   Procedure: CYSTOGASTROSTOMY;  Surgeon: Mansouraty, Telford Nab., MD;  Location: [facility];  Service: Gastroenterology;;   ENDOSCOPIC RETROGRADE CHOLANGIOPANCREATOGRAPHY (ERCP) WITH PROPOFOL N/A 11/09/2020   Procedure: ENDOSCOPIC RETROGRADE CHOLANGIOPANCREATOGRAPHY (ERCP) WITH PROPOFOL;  Surgeon: Irving Copas., MD;  Location: [place];  Service: Gastroenterology;  Laterality: N/A;   ENDOSCOPIC RETROGRADE CHOLANGIOPANCREATOGRAPHY (ERCP) WITH PROPOFOL N/A 12/20/2020   Procedure: ENDOSCOPIC RETROGRADE CHOLANGIOPANCREATOGRAPHY (ERCP) WITH PROPOFOL;  Surgeon: Rush Landmark Telford Nab., MD;  Location: [facility];  Service: Gastroenterology;  Laterality: N/A;   ENDOSCOPIC RETROGRADE CHOLANGIOPANCREATOGRAPHY (ERCP) WITH PROPOFOL N/A 01/29/2021   Procedure: ENDOSCOPIC RETROGRADE CHOLANGIOPANCREATOGRAPHY (ERCP) WITH PROPOFOL;  Surgeon: Rush Landmark Telford Nab., MD;  Location: [facility];  Service: Gastroenterology;  Laterality: N/A;   ENDOSCOPIC RETROGRADE CHOLANGIOPANCREATOGRAPHY (ERCP) WITH PROPOFOL N/A 07/24/2021   Procedure: ENDOSCOPIC RETROGRADE CHOLANGIOPANCREATOGRAPHY (ERCP) WITH PROPOFOL;  Surgeon: Rush Landmark Telford Nab., MD;  Location: [place];  Service: Gastroenterology;  Laterality: N/A;   ERCP N/A 10/10/2020   Procedure: ENDOSCOPIC RETROGRADE CHOLANGIOPANCREATOGRAPHY (ERCP);  Surgeon: Rogene Houston, MD;  [facility];  Service: Endoscopy;  Laterality: N/A;   ERCP     ERCP N/A 05/22/2022   Procedure: ENDOSCOPIC RETROGRADE CHOLANGIOPANCREATOGRAPHY (ERCP);  Surgeon: Irving Copas., MD;  Location: [facility];  Service: Gastroenterology;  Laterality: N/A;   ESOPHAGOGASTRODUODENOSCOPY N/A 11/11/2020   Procedure: ESOPHAGOGASTRODUODENOSCOPY (EGD);  Surgeon: Irving Copas., MD;  Location: [place];  Service: Gastroenterology;  Laterality: N/A;   ESOPHAGOGASTRODUODENOSCOPY N/A  11/14/2020   Procedure: ESOPHAGOGASTRODUODENOSCOPY (EGD);  Surgeon: Irving Copas., MD;  Location: [place];  Service: Gastroenterology;  Laterality: N/A;   ESOPHAGOGASTRODUODENOSCOPY (EGD) WITH PROPOFOL N/A 11/09/2020   Procedure: ESOPHAGOGASTRODUODENOSCOPY (EGD) WITH PROPOFOL;  Surgeon: Rush Landmark Telford Nab., MD;  Location: [place];  Service: Gastroenterology;  Laterality: N/A;   ESOPHAGOGASTRODUODENOSCOPY (EGD) WITH PROPOFOL N/A 11/07/2020   Procedure: ESOPHAGOGASTRODUODENOSCOPY (EGD) WITH PROPOFOL;  Surgeon: Rush Landmark Telford Nab., MD;  Location: [place];  Service: Gastroenterology;  Laterality: N/A;   ESOPHAGOGASTRODUODENOSCOPY (EGD) WITH PROPOFOL N/A 12/20/2020   Procedure: ESOPHAGOGASTRODUODENOSCOPY (EGD) WITH PROPOFOL;  Surgeon: Rush Landmark Telford Nab., MD;  [facility];  Service: Gastroenterology;  Laterality: N/A;   ESOPHAGOGASTRODUODENOSCOPY (EGD) WITH PROPOFOL N/A 06/26/2021   Procedure: ESOPHAGOGASTRODUODENOSCOPY (EGD) WITH PROPOFOL;  Surgeon: Rush Landmark Telford Nab., MD;  Location: [place];  Service: Gastroenterology;  Laterality: N/A;   ESOPHAGOGASTRODUODENOSCOPY (EGD) WITH PROPOFOL N/A 07/24/2021   Procedure: ESOPHAGOGASTRODUODENOSCOPY (EGD) WITH PROPOFOL;  Surgeon: Rush Landmark Telford Nab., MD;  Location: [place];  Service: Gastroenterology;  Laterality: N/A;  AXIOS STENT   ESOPHAGOGASTRODUODENOSCOPY (EGD) WITH PROPOFOL N/A 05/22/2022   Procedure: ESOPHAGOGASTRODUODENOSCOPY (EGD) WITH PROPOFOL;  Surgeon: Rush Landmark Telford Nab., MD;  [facility];  Service: Gastroenterology;  Laterality: N/A;   ESOPHAGOGASTRODUODENOSCOPY (EGD) WITH PROPOFOL N/A 06/16/2022   Procedure: ESOPHAGOGASTRODUODENOSCOPY (EGD) WITH PROPOFOL;  Surgeon: Rush Landmark Telford Nab., MD;  [facility];  Service: Gastroenterology;  Laterality: N/A;   EUS  11/14/2020   Procedure: UPPER ENDOSCOPIC ULTRASOUND (EUS) LINEAR;  Surgeon: Irving Copas.,  MD;  Location: [facility] ENDOSCOPY;  Service: Gastroenterology;;   EUS N/A 06/26/2021   Procedure: UPPER ENDOSCOPIC ULTRASOUND (EUS) RADIAL;  Surgeon: Justice Britain  Brooke Bonito., MD;  Location: [place];  Service: Gastroenterology;  Laterality: N/A;   EUS N/A 05/22/2022   Procedure: UPPER ENDOSCOPIC ULTRASOUND (EUS) LINEAR;  Surgeon: Irving Copas., MD;  [facility];  Service: Gastroenterology;  Laterality: N/A;   EUS N/A 06/16/2022   Procedure: UPPER ENDOSCOPIC ULTRASOUND (EUS) LINEAR;  Surgeon: Irving Copas., MD;  [facility];  Service: Gastroenterology;  Laterality: N/A;   FINE NEEDLE ASPIRATION  05/22/2022   Procedure: FINE NEEDLE ASPIRATION;  Surgeon: Rush Landmark Telford Nab., MD;  Location[facility];  Service: Gastroenterology;;   GASTROINTESTINAL STENT REMOVAL  12/20/2020   Procedure: GASTROINTESTINAL STENT REMOVAL;  Surgeon: Irving Copas., MD;  Location[facility];  Service: Gastroenterology;;  cyst gastrostomy stent and double pig tail stents x2 removed   HERNIA REPAIR Left 2002   groin   INGUINAL HERNIA REPAIR Left 04/05/2017   Procedure: RECURRENT HERNIA REPAIR INGUINAL ADULT WITH MESH;  Surgeon: [name], MD;  [facility];  Service: General;  Laterality: Left;   MASS EXCISION Right 04/29/2020   Procedure: EXCISION MASS RIGHT WRIST;  Surgeon: [name], MD;  Location: [place];  Service: Orthopedics;  Laterality: Right;   MASS EXCISION Right 10/09/2020   Procedure: EXCISION MASS, ABDOMINAL WALL, 2CM;  Surgeon: Virl Cagey, MD;  Location: [facility];  Service: General;  Laterality: Right;   PANCREATIC STENT PLACEMENT  06/26/2021   Procedure: AXIOS STENT PLACEMENT;  Surgeon: Irving Copas., MD;  Location: [place];  Service: Gastroenterology;;   PANCREATIC STENT PLACEMENT  06/16/2022   Procedure: PANCREATIC STENT PLACEMENT;  Surgeon: Irving Copas., MD;  Location: [facility];   Service: Gastroenterology;;   REMOVAL OF STONES  12/20/2020   Procedure: REMOVAL OF STONES;  Surgeon: Irving Copas., MD;  Location: [facility];  Service: Gastroenterology;;   REMOVAL OF STONES  07/24/2021   Procedure: REMOVAL OF STONES;  Surgeon: Irving Copas., MD;  Location: [place];  Service: Gastroenterology;;   REMOVAL OF STONES  05/22/2022   Procedure: REMOVAL OF STONES;  Surgeon: Irving Copas., MD;  Location: [facility];  Service: Gastroenterology;;   Joan Mayans N/A 10/10/2020   Procedure: Joan Mayans;  Surgeon: Rogene Houston, MD;  [facility];  Service: Endoscopy;  Laterality: N/A;   SPHINCTEROTOMY  07/24/2021   Procedure: SPHINCTEROTOMY;  Surgeon: Mansouraty, Telford Nab., MD;  Location: [place];  Service: Gastroenterology;;   Bess Kinds CHOLANGIOSCOPY N/A 12/20/2020   Procedure: SEGBTDVV CHOLANGIOSCOPY;  Surgeon: Irving Copas., MD;  [facility];  Service: Gastroenterology;  Laterality: N/A;   SPYGLASS CHOLANGIOSCOPY N/A 07/24/2021   Procedure: SPYGLASS CHOLANGIOSCOPY;  Surgeon: Irving Copas., MD;  Location: [place];  Service: Gastroenterology;  Laterality: N/A;   STENT REMOVAL  11/09/2020   Procedure: STENT REMOVAL;  Surgeon: Irving Copas., MD;  Location: [place];  Service: Gastroenterology;;   Lavell Islam REMOVAL  11/11/2020   Procedure: STENT REMOVAL;  Surgeon: Irving Copas., MD;  Location: [place];  Service: Gastroenterology;;   Lavell Islam REMOVAL  11/14/2020   Procedure: STENT REMOVAL;  Surgeon: Irving Copas., MD;  Location: [place];  Service: Gastroenterology;;   Lavell Islam REMOVAL  12/20/2020   Procedure: STENT REMOVAL;  Surgeon: Irving Copas., MD;  Location[facility];  Service: Gastroenterology;;  biliary x2   STENT REMOVAL  07/24/2021   Procedure: AXIOS STENT REMOVAL;  Surgeon: Irving Copas., MD;  Location: [place];  Service:  Gastroenterology;;   UPPER ESOPHAGEAL ENDOSCOPIC ULTRASOUND (EUS) N/A 11/07/2020  Procedure: UPPER ESOPHAGEAL ENDOSCOPIC ULTRASOUND (EUS);  Surgeon: Irving Copas., MD;  Location: [place];  Service: Gastroenterology;  Laterality: N/A;   UPPER GASTROINTESTINAL ENDOSCOPY     WOUND DEBRIDEMENT  11/09/2020   Procedure: CYST NECROSECTOMY;  Surgeon: Mansouraty, Telford Nab., MD;  Location: [facility] ENDOSCOPY;  Service: Gastroenterology;;   Social History   Socioeconomic History   Marital status: Single    Spouse name: Not on file   Number of children: 0   Years of education: Not on file   Highest education level: Not on file  Occupational History   Not on file  Tobacco Use   Smoking status: Every Day    Packs/day: 0.25    Years: 26.00    Total pack years: 6.50    Types: Cigarettes   Smokeless tobacco: Never   Tobacco comments:    4-5 cig daily as of 10/07/2020.  Vaping Use   Vaping Use: Never used  Substance and Sexual Activity   Alcohol use: No   Drug use: No   Sexual activity: Never    Birth control/protection: None  Other Topics Concern   Not on file  Social History Narrative   Not on file   Social Determinants of Health   Financial Resource Strain: Not on file  Food Insecurity: No Food Insecurity (06/13/2022)   Hunger Vital Sign    Worried About Running Out of Food in the Last Year: Never true    Ran Out of Food in the Last Year: Never true  Transportation Needs: No Transportation Needs (06/13/2022)   PRAPARE - Hydrologist (Medical): No    Lack of Transportation (Non-Medical): No  Physical Activity: Not on file  Stress: Not on file  Social Connections: Not on file  Intimate Partner Violence: Not At Risk (06/13/2022)   Humiliation, Afraid, Rape, and Kick questionnaire    Fear of Current or Ex-Partner: No    Emotionally Abused: No    Physically Abused: No    Sexually Abused: No   Family History  Problem Relation Age of Onset    Diabetes Father    Heart disease Father    Heart disease Maternal Grandmother    Stroke Maternal Grandfather    Colon cancer Neg Hx    Esophageal cancer Neg Hx    Inflammatory bowel disease Neg Hx    Liver disease Neg Hx    Pancreatic cancer Neg Hx    Rectal cancer Neg Hx    Stomach cancer Neg Hx    I have reviewed his medical, social, and family history in detail and updated the electronic medical record as necessary.    PHYSICAL EXAMINATION  Pulse 74   Ht '5\' 10"'$  (1.778 m)   Wt 208 lb (94.3 kg)   BMI 29.84 kg/m  Wt Readings from Last 3 Encounters:  07/21/22 208 lb (94.3 kg)  06/26/22 203 lb 12.8 oz (92.4 kg)  06/16/22 197 lb (89.4 kg)  GEN: NAD, appears stated age, doesn't appear chronically ill PSYCH: Cooperative, without pressured speech EYE: Sclera anicteric ENT: MMM CV: Nontachycardic RESP: No audible wheezing GI: NABS, soft, protuberant abdomen, TTP in upper abdomen, no rebound MSK/EXT: No significant lower extremity edema SKIN: No jaundice NEURO:  Alert & Oriented x 3, no focal deficits   REVIEW OF DATA  I reviewed the following data at the time of this encounter:  GI Procedures and Studies  December 2023 upper EUS Impression:  EGD Impression:                           - LA Grade C erosive esophagitis with no bleeding.                           - Z-line irregular, 41 cm from the incisors.                           - Extrinsic compression in the gastric antrum.                           - Erythematous mucosa in the stomach.                           - No gross lesions in the duodenal bulb, in the                            first portion of the duodenum and in the second                            portion of the duodenum (only seen after EUS                            completed).                           EUS Impression:                           - A cystic lesion was seen in the genu of the                            pancreas and pancreatic  body. Tissue has not been                            obtained. However, the endosonographic appearance                            is consistent with a pancreatic pseudocyst. AXIOS                            cystgastrostomy created. Dilated tract. Aspiration                            of 500 mL of fluid.                           - Pancreatic parenchymal abnormalities consisting                            of atrophy were noted in the pancreatic body and                            pancreatic tail.                           -  The pancreatic duct had a dilated endosonographic                            appearance in the body of the pancreas and tail of                            the pancreas.                           - Cystogastrostomy was performed.  November 2023 EUS Impression:               EGD Impression:                           - No gross lesions in the entire esophagus. Z-line                            irregular, 41 cm from the incisors.                           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                           - Extrinsic impression/compression in the gastric                            antrum.                           - Needed pediatric colonoscope to traverse into the                            duodenum.                           - No gross lesions in the duodenal bulb, in the                            first portion of the duodenum and in the second                            portion of the duodenum.                           EUS Impression:                           - A cystic lesion was seen in the genu of the                            pancreas. Tissue has not been obtained. However,                            the endosonographic appearance is consistent with a  pancreatic pseudocyst. Decision made due to the                            small size to perform an aspiration rather than                            cystgastrostomy.  Fine needle aspiration for fluid                            performed.                           - Pancreatic parenchymal abnormalities consisting                            of atrophy, lobularity and hyperechoic strands were                            noted in the entire pancreas.                           - The pancreatic duct had a dilated endosonographic                            appearance in the genu of the pancreas and body of                            the pancreas. The pancreatic duct measured up to 5                            mm in diameter in these regions.                           - There was dilation in the common bile duct and in                            the common hepatic duct which measured up to 14 mm.                           - No malignant-appearing lymph nodes were                            visualized in the celiac region (level 20),                            peripancreatic region and porta hepatis region.  November 2023 ERCP Impression:               - The major papilla appeared edematous. Prior                            biliary sphincterotomy appeared open but was  slightly narrowed.                           - The fluoroscopic examination was suspicious for                            sludge.                           - Distal narrowing noted in the duct. Balloon                            sphincteroplasty performed.                           - The biliary tree was swept and sludge was found.                           - An irregularity was found in the ventral                            pancreatic duct in the head of the pancreas and I                            could not traverse deeply into the proximal                            pancreatic duct - I am concerned as I had been                            previously of duct disruption (failed prior                            pancreatic evaluation at [facility] as well in the  past).   Laboratory Studies  Reviewed those in epic  Imaging Studies  March 2023 CTAP IMPRESSION: Interval removal of common bile duct stents when compare with the prior CT examination. Mild residual biliary ductal dilatation is seen in the intrahepatic and extrahepatic ductal system. Chronic changes about the pancreas with some scarring and thinning of the mid body of the pancreas these changes are similar to that seen on prior exam and likely related to prior interventions. No residual pseudocyst is seen. No acute pancreatitis is noted. Fluid in the small bowel and colon which may be related to a diarrheal state. Clinical correlation is recommended. No other focal abnormality is noted.   ASSESSMENT  Mr. Alonso is a 52 y.o. male with a pmh significant for bipolar disorder, schizoaffective disorder, hypertension, hyperlipidemia, OSA, complicated necrotizing pancreatitis (secondary to gallstone/post-ERCP pancreatitis with eventual smoldering complications of biliary obstruction and recurrent pseudocysts), chronic abdominal discomfort, colon polyps (TAs).  The patient is seen today for evaluation and management of:  1. Pancreatic pseudocyst   2. Pancreatic duct disruption   3. Exocrine pancreatic insufficiency   4. Chronic biliary pancreatitis (HCC)   5. Elevated alkaline phosphatase level   6. Biliary stricture   7. Chronic generalized abdominal pain   8. Esophagitis determined by endoscopy    The patient is stable  hemodynamically.  Clinically, he is much improved since his endoscopic cystgastrostomy.  The patient however, is facing a difficult situation overall.  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  I have tried as well as [facility] advanced endoscopy  without success at traversing the distal pancreatic duct stricture in an attempt of trying to help this patient endoscopically have less issues, but we have not been able to intervene unfortunately.  At this point, I greatly appreciate our pancreaticobiliary surgeon, Dr. Zenia Resides, for evaluating the patient (he is scheduled for next week) to discuss other options that may be available.  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  A distal pancreatectomy could be considered as well.  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  A Whipple procedure with biliary bypass could be considered as well but again this would be high risk for him.  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  Lets see what options are available from our surgical providers.  Let us plan to help the patient with tramadol for pain management at this time.  A referral to chronic pain for opioid therapy could be considered in the future.  I will see him next week for his AXIOS stent pull.  The risks and benefits of endoscopic evaluation were discussed with the patient; these include but are not limited to the risk of perforation, infection, bleeding, missed lesions, lack of diagnosis, severe illness requiring hospitalization, as well as anesthesia and sedation related illnesses.  The patient and/or family is agreeable to proceed.  All patient questions were answered to the best of my ability, and the patient agrees to the aforementioned plan of action with  follow-up as indicated.   PLAN  Laboratories as outlined below Proceed with scheduled EGD for AXIOS cystgastrostomy stent pull Continue Creon - 2 capsules with each meal - 1 capsule with each snack Continue daily PPI Low-fat diet Tobacco cessation recommended again Follow-up with pancreaticobiliary surgery as scheduled next week to discuss pseudocyst surgical gastrostomy/enterostomy or distal pancreatectomy versus Whipple versus other procedures Hemoglobin A1c to be evaluated and if increasing may need diabetes control with primary care provider Tramadol 50 mg every 12 hours as needed   Orders Placed This Encounter  Procedures   CBC   Comp Met (CMET)   Lipase   HgB A1c    New Prescriptions   TRAMADOL (ULTRAM) 50 MG TABLET    Take 1 tablet (50 mg total) by mouth every 12 (twelve) hours as needed.   Modified Medications   No medications on file    Planned Follow Up No follow-ups on file.   Total Time in Face-to-Face and in Coordination of Care for patient including independent/personal interpretation/review of prior testing, medical history, examination, medication adjustment, communicating results with the patient directly, and documentation with the  EHR is 30 minutes.   Justice Britain, MD [facility] Office # [phone]

## 2022-07-22 ENCOUNTER — Telehealth: Payer: Self-pay | Admitting: Gastroenterology

## 2022-07-22 NOTE — Telephone Encounter (Signed)
Patient was prescribed Tramadol yesterday; however, patient was informed it needed prior authorization.  Please call patient and advise.  Thank you.

## 2022-07-22 NOTE — Telephone Encounter (Signed)
Attempted to reach the pt and no answer no voice mail. Pt was seen in the office yesterday and prescribed Tramadol.  PA will go to the PA team to handle.  Will send a message to the pt via MY Chart as well.

## 2022-07-23 ENCOUNTER — Encounter (HOSPITAL_COMMUNITY): Payer: Self-pay | Admitting: Gastroenterology

## 2022-07-23 DIAGNOSIS — K861 Other chronic pancreatitis: Secondary | ICD-10-CM | POA: Insufficient documentation

## 2022-07-23 DIAGNOSIS — G8929 Other chronic pain: Secondary | ICD-10-CM | POA: Insufficient documentation

## 2022-07-23 NOTE — Progress Notes (Signed)
Attempted to obtain medical history via telephone, unable to reach at this time. Unable to leave voicemail to return pre surgical testing department's phone call,due to line kept ringing, no answer.

## 2022-07-24 ENCOUNTER — Telehealth: Payer: Self-pay | Admitting: Gastroenterology

## 2022-07-24 NOTE — Telephone Encounter (Signed)
The pt has been advised that the PA team will work on [place] and we will let him know as soon as we get a response.

## 2022-07-24 NOTE — Telephone Encounter (Signed)
Patient is calling I reference to Prior authorization. Please advise

## 2022-07-29 NOTE — Anesthesia Preprocedure Evaluation (Signed)
Anesthesia Evaluation  Patient identified by MRN, date of birth, ID band Patient awake    Reviewed: Allergy & Precautions, H&P , NPO status , Patient's Chart, lab work & pertinent test results  Airway Mallampati: III  TM Distance: >3 FB Neck ROM: Full    Dental no notable dental hx. (+) Edentulous Upper, Edentulous Lower, Dental Advisory Given   Pulmonary sleep apnea , COPD, Current Smoker and Patient abstained from smoking.   Pulmonary exam normal breath sounds clear to auscultation       Cardiovascular Exercise Tolerance: Good hypertension, negative cardio ROS  Rhythm:Regular Rate:Normal     Neuro/Psych   Anxiety Depression Bipolar Disorder   negative neurological ROS  negative psych ROS   GI/Hepatic negative GI ROS, Neg liver ROS,,,Pancreatitis   Endo/Other  negative endocrine ROS    Renal/GU negative Renal ROS  negative genitourinary   Musculoskeletal   Abdominal   Peds  Hematology negative hematology ROS (+)   Anesthesia Other Findings   Reproductive/Obstetrics negative OB ROS                             Anesthesia Physical Anesthesia Plan  ASA: 3  Anesthesia Plan: MAC   Post-op Pain Management: Minimal or no pain anticipated   Induction: Intravenous  PONV Risk Score and Plan: 0 and Propofol infusion  Airway Management Planned: Natural Airway and Simple Face Mask  Additional Equipment:   Intra-op Plan:   Post-operative Plan:   Informed Consent: I have reviewed the patients History and Physical, chart, labs and discussed the procedure including the risks, benefits and alternatives for the proposed anesthesia with the patient or authorized representative who has indicated his/her understanding and acceptance.     Dental advisory given  Plan Discussed with: CRNA  Anesthesia Plan Comments:        Anesthesia Quick Evaluation

## 2022-07-30 ENCOUNTER — Encounter (HOSPITAL_COMMUNITY)
Admission: RE | Disposition: A | Discharge status: Home / Self Care | Payer: Self-pay | Source: Home / Self Care | Attending: Gastroenterology

## 2022-07-30 ENCOUNTER — Ambulatory Visit (HOSPITAL_COMMUNITY): Payer: Medicaid Other | Admitting: Anesthesiology

## 2022-07-30 ENCOUNTER — Ambulatory Visit (HOSPITAL_BASED_OUTPATIENT_CLINIC_OR_DEPARTMENT_OTHER): Payer: Medicaid Other | Admitting: Anesthesiology

## 2022-07-30 ENCOUNTER — Ambulatory Visit (HOSPITAL_COMMUNITY)
Admission: RE | Admit: 2022-07-30 | Discharge: 2022-07-30 | Disposition: A | Discharge status: Home / Self Care | Payer: Medicaid Other | Attending: Gastroenterology | Admitting: Gastroenterology

## 2022-07-30 ENCOUNTER — Other Ambulatory Visit: Payer: Self-pay

## 2022-07-30 ENCOUNTER — Encounter (HOSPITAL_COMMUNITY): Payer: Self-pay | Admitting: Gastroenterology

## 2022-07-30 DIAGNOSIS — G4730 Sleep apnea, unspecified: Secondary | ICD-10-CM | POA: Insufficient documentation

## 2022-07-30 DIAGNOSIS — W449XXA Unspecified foreign body entering into or through a natural orifice, initial encounter: Secondary | ICD-10-CM | POA: Insufficient documentation

## 2022-07-30 DIAGNOSIS — K2289 Other specified disease of esophagus: Secondary | ICD-10-CM | POA: Diagnosis not present

## 2022-07-30 DIAGNOSIS — E785 Hyperlipidemia, unspecified: Secondary | ICD-10-CM | POA: Insufficient documentation

## 2022-07-30 DIAGNOSIS — Z978 Presence of other specified devices: Secondary | ICD-10-CM | POA: Diagnosis not present

## 2022-07-30 DIAGNOSIS — I10 Essential (primary) hypertension: Secondary | ICD-10-CM | POA: Insufficient documentation

## 2022-07-30 DIAGNOSIS — K259 Gastric ulcer, unspecified as acute or chronic, without hemorrhage or perforation: Secondary | ICD-10-CM | POA: Diagnosis not present

## 2022-07-30 DIAGNOSIS — Z4659 Encounter for fitting and adjustment of other gastrointestinal appliance and device: Secondary | ICD-10-CM

## 2022-07-30 DIAGNOSIS — T182XXA Foreign body in stomach, initial encounter: Secondary | ICD-10-CM

## 2022-07-30 DIAGNOSIS — F17210 Nicotine dependence, cigarettes, uncomplicated: Secondary | ICD-10-CM | POA: Insufficient documentation

## 2022-07-30 DIAGNOSIS — J449 Chronic obstructive pulmonary disease, unspecified: Secondary | ICD-10-CM | POA: Insufficient documentation

## 2022-07-30 DIAGNOSIS — F259 Schizoaffective disorder, unspecified: Secondary | ICD-10-CM | POA: Diagnosis not present

## 2022-07-30 DIAGNOSIS — F419 Anxiety disorder, unspecified: Secondary | ICD-10-CM | POA: Insufficient documentation

## 2022-07-30 DIAGNOSIS — G4733 Obstructive sleep apnea (adult) (pediatric): Secondary | ICD-10-CM | POA: Diagnosis not present

## 2022-07-30 DIAGNOSIS — K863 Pseudocyst of pancreas: Secondary | ICD-10-CM

## 2022-07-30 DIAGNOSIS — Z9049 Acquired absence of other specified parts of digestive tract: Secondary | ICD-10-CM | POA: Diagnosis not present

## 2022-07-30 DIAGNOSIS — Z8719 Personal history of other diseases of the digestive system: Secondary | ICD-10-CM

## 2022-07-30 DIAGNOSIS — F319 Bipolar disorder, unspecified: Secondary | ICD-10-CM | POA: Insufficient documentation

## 2022-07-30 HISTORY — PX: ESOPHAGOGASTRODUODENOSCOPY (EGD) WITH PROPOFOL: SHX5813

## 2022-07-30 HISTORY — PX: STENT REMOVAL: SHX6421

## 2022-07-30 SURGERY — ESOPHAGOGASTRODUODENOSCOPY (EGD) WITH PROPOFOL
Anesthesia: Monitor Anesthesia Care

## 2022-07-30 MED ORDER — LACTATED RINGERS IV SOLN: INTRAVENOUS | Status: DC

## 2022-07-30 MED ORDER — PROPOFOL 10 MG/ML IV BOLUS
INTRAVENOUS | Status: DC | PRN
  Administered 2022-07-30: 125 ug/kg/min via INTRAVENOUS
  Administered 2022-07-30: 60 mg via INTRAVENOUS

## 2022-07-30 MED ORDER — DEXMEDETOMIDINE HCL IN NACL 80 MCG/20ML IV SOLN
INTRAVENOUS | Status: DC | PRN
  Administered 2022-07-30: 8 ug via BUCCAL

## 2022-07-30 MED ORDER — SODIUM CHLORIDE 0.9 % IV SOLN: INTRAVENOUS | Status: DC

## 2022-07-30 SURGICAL SUPPLY — 15 items

## 2022-07-30 NOTE — Anesthesia Postprocedure Evaluation (Signed)
Anesthesia Post Note  Patient: Steve Romero  Procedure(s) Performed: ESOPHAGOGASTRODUODENOSCOPY (EGD) WITH PROPOFOL STENT REMOVAL     Patient location during evaluation: Endoscopy Anesthesia Type: MAC Level of consciousness: awake and alert Pain management: pain level controlled Vital Signs Assessment: post-procedure vital signs reviewed and stable Respiratory status: spontaneous breathing, nonlabored ventilation and respiratory function stable Cardiovascular status: stable and blood pressure returned to baseline Postop Assessment: no apparent nausea or vomiting Anesthetic complications: no  No notable events documented.  Last Vitals:  Vitals:   07/30/22 0940 07/30/22 0945  BP: (!) 134/99   Pulse: 65 66  Resp: (!) 21 19  Temp:    SpO2: 94% 96%    Last Pain:  Vitals:   07/30/22 0931  TempSrc: Temporal  PainSc: 0-No pain                 Rhone Ozaki,W. EDMOND

## 2022-07-30 NOTE — Op Note (Signed)
[facility] Patient Name: Steve Romero Procedure Date: 07/30/2022 MRN: [id number] Attending MD: Justice Britain , MD, [id number] Date of Birth: September 15, 1970 CSN: [id number] Age: 52 Admit Type: Outpatient Procedure:                Upper GI endoscopy Indications:              Foreign body in the stomach, Pancreatic pseudocyst Providers:                Justice Britain, MD, Dulcy Fanny, [facility] Technician, Technician Referring MD:             Blanchard Mane. [place] Pam M. Gottschalk Medicines:                Monitored Anesthesia Care Complications:            No immediate complications. Estimated Blood Loss:     Estimated blood loss was minimal. Procedure:                Pre-Anesthesia Assessment:                           - Prior to the procedure, a History and Physical                            was performed, and patient medications and                            allergies were reviewed. The patient's tolerance of                            previous anesthesia was also reviewed. The risks                            and benefits of the procedure and the sedation                            options and risks were discussed with the patient.                            All questions were answered, and informed consent                            was obtained. Prior Anticoagulants: The patient has                            taken no anticoagulant or antiplatelet agents. ASA                            Grade Assessment: III - A patient with severe                            systemic disease. After reviewing the risks and  benefits, the patient was deemed in satisfactory                            condition to undergo the procedure.                           After obtaining informed consent, the endoscope was                            passed under direct vision. Throughout the                            procedure,  the patient's blood pressure, pulse, and                            oxygen saturations were monitored continuously. The                            [id number] ([id number]) Olympus therapeutic endoscope                            was introduced through the mouth, and advanced to                            the second part of duodenum. The upper GI endoscopy                            was accomplished without difficulty. The patient                            tolerated the procedure. Scope In: Scope Out: Findings:      No gross lesions were noted in the entire esophagus.      The Z-line was irregular and was found 41 cm from the incisors.      A previously placed AXIOS cystgastrostomy stent was found in the gastric       antrum and a double-pigtail was found through it. Stent removal was       accomplished with a snare to remove the double-pigtail and then       subsequently the AXIOS removed with a Raptor grasping device.      The pseudocyst cavity was entered and showing a good amount of pink       viable tissue still present.      One non-bleeding superficial gastric ulcer with a clean ulcer base       (Forrest Class III) was found in the gastric antrum. The lesion was 7 mm       in largest dimension. This is most likely from the cyst stents rubbing       against the gastric mucosa based on positioning of the previous       cystgastrostomy      No gross lesions were noted in the duodenal bulb, in the first portion       of the duodenum and in the second portion of the duodenum. Impression:               - No gross lesions in the entire esophagus.                           -  Z-line irregular, 41 cm from the incisors.                           - Pre-existing cystgastrostomy stents were found                            and removed.                           - Pseudocyst cavity entered and shows healthy                            viable tissue present.                           - Non-bleeding  gastric ulcer with a clean ulcer                            base (Forrest Class III) from the previous stents.                           - No gross lesions in the duodenal bulb, in the                            first portion of the duodenum and in the second                            portion of the duodenum. Moderate Sedation:      Not Applicable - Patient had care per Anesthesia. Recommendation:           - The patient will be observed post-procedure,                            until all discharge criteria are met.                           - Discharge patient to home.                           - Patient has a contact number available for                            emergencies. The signs and symptoms of potential                            delayed complications were discussed with the                            patient. Return to normal activities tomorrow.                            Written discharge instructions were provided to the                            patient.                           -  Advance diet as tolerated.                           - Continue present medications.                           - Repeat cross-sectional imaging in 2 months.                           - Follow-up with Dr. Zenia Resides (pancreaticobiliary                            surgery) as scheduled to discuss if recurrent                            pseudocyst development whether you still or [name] or                            surgical cystgastrostomy/surgical cystenterostomy                            may need to be considered in the setting of the                            pancreatic duct disruption and inability to                            traverse this area both here and at [facility] with now 3                            recurrences of pseudocyst.                           - The findings and recommendations were discussed                            with the patient.                           - The findings and  recommendations were discussed                            with the patient's family. Procedure Code(s):        --- Professional ---                           [phone], Esophagogastroduodenoscopy, flexible,                            transoral; with removal of foreign body(s) Diagnosis Code(s):        --- Professional ---                           K22.89, Other specified disease of esophagus  Z97.8, Presence of other specified devices                           K25.9, Gastric ulcer, unspecified as acute or                            chronic, without hemorrhage or perforation                           T18.2XXA, Foreign body in stomach, initial encounter                           K86.3, Pseudocyst of pancreas CPT copyright 2022 American Medical Association. All rights reserved. The codes documented in this report are preliminary and upon coder review may  be revised to meet current compliance requirements. Justice Britain, MD 07/30/2022 9:39:48 AM Number of Addenda: 0

## 2022-07-30 NOTE — Discharge Instructions (Signed)
YOU HAD AN ENDOSCOPIC PROCEDURE TODAY: Refer to the procedure report and other information in the discharge instructions given to you for any specific questions about what was found during the examination. If this information does not answer your questions, please call [place] office at [phone] to clarify.  ° °YOU SHOULD EXPECT: Some feelings of bloating in the abdomen. Passage of more gas than usual. Walking can help get rid of the air that was put into your GI tract during the procedure and reduce the bloating. If you had a lower endoscopy (such as a colonoscopy or flexible sigmoidoscopy) you may notice spotting of blood in your stool or on the toilet paper. Some abdominal soreness may be present for a day or two, also. ° °DIET: Your first meal following the procedure should be a light meal and then it is ok to progress to your normal diet. A half-sandwich or bowl of soup is an example of a good first meal. Heavy or fried foods are harder to digest and may make you feel nauseous or bloated. Drink plenty of fluids but you should avoid alcoholic beverages for 24 hours. If you had a esophageal dilation, please see attached instructions for diet.   ° °ACTIVITY: Your care partner should take you home directly after the procedure. You should plan to take it easy, moving slowly for the rest of the day. You can resume normal activity the day after the procedure however YOU SHOULD NOT DRIVE, use power tools, machinery or perform tasks that involve climbing or major physical exertion for 24 hours (because of the sedation medicines used during the test).  ° °SYMPTOMS TO REPORT IMMEDIATELY: °A gastroenterologist can be reached at any hour. Please call [phone]  for any of the following symptoms:  °Following lower endoscopy (colonoscopy, flexible sigmoidoscopy) °Excessive amounts of blood in the stool  °Significant tenderness, worsening of abdominal pains  °Swelling of the abdomen that is new, acute  °Fever of 100° or  higher  °Following upper endoscopy (EGD, EUS, ERCP, esophageal dilation) °Vomiting of blood or coffee ground material  °New, significant abdominal pain  °New, significant chest pain or pain under the shoulder blades  °Painful or persistently difficult swallowing  °New shortness of breath  °Black, tarry-looking or red, bloody stools ° °FOLLOW UP:  °If any biopsies were taken you will be contacted by phone or by letter within the next 1-3 weeks. Call [phone]  if you have not heard about the biopsies in 3 weeks.  °Please also call with any specific questions about appointments or follow up tests. ° °

## 2022-07-30 NOTE — Transfer of Care (Signed)
Immediate Anesthesia Transfer of Care Note  Patient: Steve Romero  Procedure(s) Performed: Procedure(s): ESOPHAGOGASTRODUODENOSCOPY (EGD) WITH PROPOFOL (N/A) STENT REMOVAL  Patient Location: PACU and Endoscopy Unit  Anesthesia Type:MAC  Level of Consciousness: awake, alert  and oriented  Airway & Oxygen Therapy: Patient Spontanous Breathing and Patient connected to nasal cannula oxygen  Post-op Assessment: Report given to RN and Post -op Vital signs reviewed and stable  Post vital signs: Reviewed and stable  Last Vitals:  Vitals:   07/30/22 0722  BP: (!) 165/116  Pulse: 78  Temp: 36.4 C  SpO2: 43%    Complications: No apparent anesthesia complications

## 2022-07-30 NOTE — Interval H&P Note (Signed)
History and Physical Interval Note:  07/30/2022 7:46 AM  Steve Romero  has presented today for surgery, with the diagnosis of hx pancreatitis.  The various methods of treatment have been discussed with the patient and family. After consideration of risks, benefits and other options for treatment, the patient has consented to  Procedure(s): ESOPHAGOGASTRODUODENOSCOPY (EGD) WITH PROPOFOL (N/A) as a surgical intervention.  The patient's history has been reviewed, patient examined, no change in status, stable for surgery.  I have reviewed the patient's chart and labs.  Questions were answered to the patient's satisfaction.     [employer]

## 2022-07-31 ENCOUNTER — Encounter (HOSPITAL_COMMUNITY): Payer: Self-pay | Admitting: Gastroenterology

## 2022-09-03 ENCOUNTER — Encounter: Payer: Self-pay | Admitting: Radiology

## 2022-09-11 ENCOUNTER — Telehealth: Payer: Self-pay | Admitting: Gastroenterology

## 2022-09-11 MED ORDER — TRAMADOL HCL 50 MG PO TABS: 50.0000 mg | ORAL_TABLET | Freq: Two times a day (BID) | ORAL | 0 refills | Status: AC | PRN

## 2022-09-11 NOTE — Telephone Encounter (Signed)
Inbound call from patient, is wishing to refill his prescription for Tramadol.

## 2022-09-11 NOTE — Telephone Encounter (Signed)
Please advise if OK to refill Tramadol.  Thank you

## 2022-09-21 ENCOUNTER — Telehealth: Payer: Self-pay | Admitting: Gastroenterology

## 2022-09-21 DIAGNOSIS — K863 Pseudocyst of pancreas: Secondary | ICD-10-CM

## 2022-09-21 NOTE — Telephone Encounter (Signed)
Order entered for CT scan see past procedure note.  Order sent to the schedulers. .  Attempted to reach pt no answer no voice mail  I have sent a message to the pt via My Chart

## 2022-09-21 NOTE — Telephone Encounter (Signed)
Inbound call from patient stating that Dr. Rush Landmark had stated that he wanted patient to have a follow up CT scan 6-8 weeks after his procedure he had on January 25th. Patient is requesting a call back to discuss. Please advise.

## 2022-09-25 ENCOUNTER — Ambulatory Visit: Payer: Medicaid Other | Admitting: Family Medicine

## 2022-09-25 ENCOUNTER — Encounter: Payer: Self-pay | Admitting: Family Medicine

## 2022-09-25 ENCOUNTER — Ambulatory Visit (HOSPITAL_BASED_OUTPATIENT_CLINIC_OR_DEPARTMENT_OTHER)
Admission: RE | Admit: 2022-09-25 | Discharge: 2022-09-25 | Disposition: A | Discharge status: Home / Self Care | Payer: Medicaid Other | Source: Ambulatory Visit | Attending: Gastroenterology | Admitting: Gastroenterology

## 2022-09-25 VITALS — BP 121/81 | HR 67 | Temp 98.6°F | Ht 70.0 in | Wt 212.0 lb

## 2022-09-25 DIAGNOSIS — E119 Type 2 diabetes mellitus without complications: Secondary | ICD-10-CM

## 2022-09-25 DIAGNOSIS — K863 Pseudocyst of pancreas: Secondary | ICD-10-CM

## 2022-09-25 LAB — BAYER DCA HB A1C WAIVED: HB A1C (BAYER DCA - WAIVED): 7.7 % — ABNORMAL HIGH (ref 4.8–5.6)

## 2022-09-25 LAB — POCT I-STAT CREATININE: Creatinine, Ser: 0.8 mg/dL (ref 0.61–1.24)

## 2022-09-25 MED ORDER — BD PEN NEEDLE NANO U/F 32G X 4 MM MISC: 3 refills | Status: DC

## 2022-09-25 MED ORDER — BLOOD GLUCOSE MONITORING SUPPL DEVI: 1.0000 | Freq: Three times a day (TID) | 0 refills | Status: DC

## 2022-09-25 MED ORDER — BLOOD GLUCOSE TEST VI STRP: ORAL_STRIP | 99 refills | Status: DC

## 2022-09-25 MED ORDER — TRESIBA FLEXTOUCH 200 UNIT/ML ~~LOC~~ SOPN: 10.0000 [IU] | PEN_INJECTOR | Freq: Every day | SUBCUTANEOUS | 3 refills | Status: DC

## 2022-09-25 MED ORDER — FREESTYLE LIBRE 3 SENSOR MISC: 3 refills | Status: DC

## 2022-09-25 MED ORDER — LANCETS MISC. MISC: 99 refills | Status: DC

## 2022-09-25 MED ORDER — LANCET DEVICE MISC: 0 refills | Status: DC

## 2022-09-25 MED ORDER — FREESTYLE LIBRE READER DEVI: 1.0000 [IU] | Freq: Every day | 1 refills | Status: AC

## 2022-09-25 MED ORDER — IOHEXOL 300 MG/ML  SOLN
100.0000 mL | Freq: Once | INTRAMUSCULAR | Status: AC | PRN
  Administered 2022-09-25: 100 mL via INTRAVENOUS

## 2022-09-25 NOTE — Progress Notes (Addendum)
Subjective: CC: pancreatic pseudocyst PCP: [name], DO [id number] Grenz is a 52 y.o. male presenting to clinic today for:  1. Pancreatic pseudocyst Now being seen by [facility] after being hospitalized in January.  He is supposed to be seen again in 2 months.  There is discussion about distal pancreatectomy but the risk of bleeding and splenectomy is a concern.  They will revisit this plan if he has recurrence.  Overall his symptomology is often fairly manageable.  Nothing significant such that he has needed to see them again more urgently.  He has a follow-up with them in a couple of months.  Compliant with medications as prescribed.  Does admit to some increased thirst and urination and his A1c was above 6.5 when checked in January by his specialist.  He has been intermittently checking his blood sugar with an old meter of his father's and on average she is seeing 150s to 160s.  He tries to follow a pancreatic diet.   ROS: Per HPI  Allergies  Allergen Reactions   Desipramine Nausea Only and Rash   Past Medical History:  Diagnosis Date   Acute pancreatitis without necrosis or infection, unspecified    AKI (acute kidney injury) ([place]) 05/17/2022   Anxiety 10/2014   Ascites    Bipolar disorder ([place]) age 89   COPD (chronic obstructive pulmonary disease) ([place]) 2016   Depression age 55   Enlarged prostate    Hyperlipidemia 2016   Hypertension 2013   Nausea vomiting and diarrhea 06/15/2022   Schizoaffective disorder ([place]) 11/13/2014   Sleep apnea    Does not wear c-pap, sleeps in sitting up position per pt   Thyroid disease 11/2014    Current Outpatient Medications:    lipase/protease/amylase (CREON) 36000 UNITS CPEP capsule, Take 2 capsules (72,000 Units total) by mouth 3 (three) times daily with meals. (Patient taking differently: Take 36,000-72,000 Units by mouth See admin instructions. Take 2 capsules with each meal and 1 capsule with each snack), Disp: 270 capsule, Rfl:  1   ondansetron (ZOFRAN-ODT) 4 MG disintegrating tablet, Take 1 tablet (4 mg total) by mouth every 8 (eight) hours as needed for nausea or vomiting., Disp: 60 tablet, Rfl: 1   traMADol (ULTRAM) 50 MG tablet, Take 1 tablet (50 mg total) by mouth every 12 (twelve) hours as needed., Disp: 60 tablet, Rfl: 0 Social History   Socioeconomic History   Marital status: Single    Spouse name: Not on file   Number of children: 0   Years of education: Not on file   Highest education level: Not on file  Occupational History   Not on file  Tobacco Use   Smoking status: Every Day    Packs/day: 0.25    Years: 26.00    Additional pack years: 0.00    Total pack years: 6.50    Types: Cigarettes   Smokeless tobacco: Never   Tobacco comments:    4-5 cig daily as of 10/07/2020.  Vaping Use   Vaping Use: Never used  Substance and Sexual Activity   Alcohol use: No   Drug use: No   Sexual activity: Never    Birth control/protection: None  Other Topics Concern   Not on file  Social History Narrative   Not on file   Social Determinants of Health   Financial Resource Strain: Not on file  Food Insecurity: No Food Insecurity (06/13/2022)   Hunger Vital Sign    Worried About Running Out of Food in the  Last Year: Never true    Bath in the Last Year: Never true  Transportation Needs: No Transportation Needs (06/13/2022)   PRAPARE - Hydrologist (Medical): No    Lack of Transportation (Non-Medical): No  Physical Activity: Not on file  Stress: Not on file  Social Connections: Not on file  Intimate Partner Violence: Not At Risk (06/13/2022)   Humiliation, Afraid, Rape, and Kick questionnaire    Fear of Current or Ex-Partner: No    Emotionally Abused: No    Physically Abused: No    Sexually Abused: No   Family History  Problem Relation Age of Onset   Diabetes Father    Heart disease Father    Heart disease Maternal Grandmother    Stroke Maternal Grandfather     Colon cancer Neg Hx    Esophageal cancer Neg Hx    Inflammatory bowel disease Neg Hx    Liver disease Neg Hx    Pancreatic cancer Neg Hx    Rectal cancer Neg Hx    Stomach cancer Neg Hx     Objective: Office vital signs reviewed. BP 121/81   Pulse 67   Temp 98.6 F (37 C)   Ht 5\' 10"  (1.778 m)   Wt 212 lb (96.2 kg)   SpO2 98%   BMI 30.42 kg/m   Physical Examination:  General: Awake, alert, chronically ill-appearing male, No acute distress HEENT: Sclera white.  Moist mucous membranes Cardio: regular rate and rhythm, S1S2 heard, no murmurs appreciated Pulm: clear to auscultation bilaterally, no wheezes, rhonchi or rales; normal work of breathing on room air GI: Protuberant abdomen that is soft.  He has some generalized tenderness to palpation but no rebound or guarding.  He has some postsurgical sites appreciated along the abdomen that are well-healed  Assessment/ Plan: 52 y.o. male   New onset type 2 diabetes mellitus ([place]) - Plan: Bayer DCA Hb A1c Waived, Blood Glucose Monitoring Suppl DEVI, Glucose Blood (BLOOD GLUCOSE TEST STRIPS) STRP, Lancet Device MISC, Lancets Misc. MISC, insulin degludec (TRESIBA FLEXTOUCH) 200 UNIT/ML FlexTouch Pen, Insulin Pen Needle (BD PEN NEEDLE NANO U/F) 32G X 4 MM MISC, Continuous Blood Gluc Sensor (FREESTYLE LIBRE 3 SENSOR) MISC, Continuous Blood Gluc Receiver (FREESTYLE LIBRE READER) DEVI, AMB Referral to Pharmacy Medication Management  Pancreatic pseudocyst - Plan: CMP14+EGFR  A1c continues to rise at 7.7 today.  Given very complex nature given the pancreatic issues I am going to place him on insulin rather than an oral medication as I worry about inducing a pancreatitis with some of the oral regimens.  We will start him on 10 units of Tresiba nightly, please monitor freestyle libre meter.  He has an appointment with clinical pharmacy here to demonstrate how to administer insulin and monitor blood sugars and or troubleshoot any hypoglycemic or  hyperglycemic episodes.  I will see him back in 3 months for repeat A1c, foot exam and urine microalbumin.  No orders of the defined types were placed in this encounter.  No orders of the defined types were placed in this encounter.    [name], DO [place] [phone]

## 2022-09-25 NOTE — Patient Instructions (Signed)
Preventing Diabetes Mellitus Complications You can help to prevent or slow down problems that are caused by diabetes (diabetes mellitus). If you follow your diabetes plan and take care of yourself, you can lower your chances of having severe problems. What actions can I take to prevent problems caused by diabetes? Managing your diabetes  Follow instructions from your health care providers about how to manage your diabetes. You may have a team of health care providers. They can teach you how to care for yourself and can answer any questions you have. Learn about your condition. This can help you make healthy choices when it comes to eating and physical activity. Know your target range for your blood sugar (glucose). Check your blood glucose levels. Your health care provider will help you decide how often to check your levels. How often you check may depend on your goals for treatment and how well you are meeting them. Ask your health care provider if you should take a low dose of aspirin every day. Ask what dose is best for you. Taking a low dose of aspirin can help prevent heart disease. Controlling your blood pressure and cholesterol Your target blood pressure is based on: Your age. Your medicines. How long you have had diabetes. Other conditions you have. Controlling your cholesterol may: Help prevent heart disease and stroke. Improve your blood flow. To control your blood pressure and cholesterol: Follow instructions from your health care provider about meal planning, exercise, and medicines. Make sure your health care provider checks your blood pressure at every visit. Monitor your blood pressure at home as told by your health care provider. Have your cholesterol checked at least once a year. A medicine called statin can help to lower your cholesterol. Ask your health care provider if you are or should be taking a statin.  Medical appointments and vaccines Have yearly physical exams and  eye exams. Your health care providers will tell you how often you need to see them. It may depend on your diabetes plan. Every visit with a health care provider should include a measure of: Your weight. Your blood pressure. Your blood glucose. Your A1C level should be checked: At least 2 times a year, if you are meeting your treatment goals. 4 times a year, if you are not meeting treatment goals or if your goals have changed. Your blood lipids (lipid profile) should be checked once a year. You should also be checked once a year for protein in your urine (urine microalbumin). If you have type 1 diabetes, get an eye exam within 5 years after you are diagnosed. Get an exam once a year after that first exam. If you have type 2 diabetes, get an eye exam as soon as you are diagnosed. Get an exam once a year after that first exam. Keep your vaccines current. You should get: A flu vaccine every year. A pneumonia vaccine and a hepatitis B vaccine. If you are 72 years or older, you may get the pneumonia vaccine as a series of two shots. Ask your health care provider what other vaccines you may need to get. Keep all follow-up visits. This can help ensure that problems can be avoided or found early and treated. Lifestyle Do not use any products that contain nicotine or tobacco. These products include cigarettes, chewing tobacco, and vaping devices, such as e-cigarettes. If you need help quitting, ask your health care provider. If you quit smoking, you may: Lower your risk for heart attack, stroke, nerve disease, and  kidney disease. Help your blood move through your body better. Help your blood pressure and cholesterol levels. Do not drink alcohol if: Your health care provider tells you not to drink. You are pregnant, may be pregnant, or are planning to become pregnant. If you drink alcohol: Limit how much you have to: 0-1 drink a day for women. 0-2 drinks a day for men. Know how much alcohol is in  your drink. In the [place] one drink equals one 12 oz bottle of beer (355 mL), one 5 oz glass of wine (148 mL), or one 1 oz glass of hard liquor (44 mL). Taking care of your feet Diabetes may cause you to have poor blood flow to your legs and feet. It can also cause: The skin on your feet to get thinner, break more easily, and heal more slowly. Nerve damage in your legs and feet. This can result in less feeling. You may not notice small injuries. This could lead to bigger problems. To avoid foot problems: Check your skin and feet every day for cuts, bruises, redness, blisters, or sores. Have a foot exam with your health care provider once a year. During the exam, your health care provider will: Look at the structure and skin of your feet. Check your pulses and amount of feeling in your feet. Make sure that your health care provider does a visual foot exam at every visit.  Taking care of your teeth People who have diabetes that is not controlled well are more likely to have gum disease (periodontal disease). Diabetes can make gum disease harder to control. If not treated, it can lead to tooth loss. To prevent this: Brush your teeth twice a day. Floss at least once a day. Visit your dentist 2 times a year. Managing stress Living with diabetes can be stressful. When you are stressed, you may: Have higher blood glucose because of stress hormones. Be distracted from taking good care of yourself. Be aware of your stress level and make changes to help you manage tough times. To lower your stress levels: Think about joining a support group. Do planned relaxation or meditation. Do a hobby that you enjoy. Maintain healthy relationships. Try to exercise every day. Work with your health care provider or a mental health professional. Where to find more information American Diabetes Association: diabetes.org Association of Diabetes Care & Education Specialists: diabeteseducator.org This information  is not intended to replace advice given to you by your health care provider. Make sure you discuss any questions you have with your health care provider. Document Revised: 12/24/2021 Document Reviewed: 12/24/2021 Elsevier Patient Education  [place].

## 2022-09-25 NOTE — Addendum Note (Signed)
Addended by: [name] on: 09/25/2022 03:23 PM   Modules accepted: Orders

## 2022-09-26 LAB — CMP14+EGFR
ALT: 38 IU/L (ref 0–44)
AST: 11 IU/L (ref 0–40)
Albumin/Globulin Ratio: 2 (ref 1.2–2.2)
Albumin: 4.3 g/dL (ref 3.8–4.9)
Alkaline Phosphatase: 233 IU/L — ABNORMAL HIGH (ref 44–121)
BUN/Creatinine Ratio: 18 (ref 9–20)
BUN: 17 mg/dL (ref 6–24)
Bilirubin Total: 0.3 mg/dL (ref 0.0–1.2)
CO2: 24 mmol/L (ref 20–29)
Calcium: 9.4 mg/dL (ref 8.7–10.2)
Chloride: 99 mmol/L (ref 96–106)
Creatinine, Ser: 0.95 mg/dL (ref 0.76–1.27)
Globulin, Total: 2.2 g/dL (ref 1.5–4.5)
Glucose: 240 mg/dL — ABNORMAL HIGH (ref 70–99)
Potassium: 4.8 mmol/L (ref 3.5–5.2)
Sodium: 139 mmol/L (ref 134–144)
Total Protein: 6.5 g/dL (ref 6.0–8.5)
eGFR: 97 mL/min/{1.73_m2} (ref >59)

## 2022-09-28 ENCOUNTER — Telehealth: Payer: Self-pay | Admitting: Gastroenterology

## 2022-09-28 NOTE — Telephone Encounter (Signed)
Received call report from [facility]:    IMPRESSION: 1. Interval removal of cystogastrostomy stent with increased atrophy and hypoenhancement of the pancreatic neck/body with similar heterogeneous enhancement of the pancreatic uncinate process, head, and tail. A small residual collection along the prior stent measures 1.5 x 0.8 cm. 2. Irregular hypoenhancement of the central left hepatic lobe and medial caudate lobe, new from 07/13/2022. Findings are nonspecific and could represent perfusional/inflammatory change versus developing hepatic infarct. No discrete fluid collection. The hepatic vasculature appears patent. 3. Similar mild intrahepatic and extrahepatic bile duct dilation to the level of the pancreatic head.

## 2022-09-28 NOTE — Telephone Encounter (Signed)
[facility] is calling with a patient report. Please call back Rubi [phone]

## 2022-09-28 NOTE — Telephone Encounter (Signed)
MD aware. I will place some thoughts on the result note from the imaging study. Thanks. GM

## 2022-09-29 ENCOUNTER — Ambulatory Visit (INDEPENDENT_AMBULATORY_CARE_PROVIDER_SITE_OTHER): Payer: Medicaid Other | Admitting: Pharmacist

## 2022-09-29 ENCOUNTER — Telehealth: Payer: Self-pay

## 2022-09-29 DIAGNOSIS — E119 Type 2 diabetes mellitus without complications: Secondary | ICD-10-CM

## 2022-09-29 MED ORDER — TOUJEO SOLOSTAR 300 UNIT/ML ~~LOC~~ SOPN: 10.0000 [IU] | PEN_INJECTOR | Freq: Every day | SUBCUTANEOUS | 99 refills | Status: DC

## 2022-09-29 NOTE — Telephone Encounter (Signed)
Steve Romero is not covered by patient's insurance.  Covered alternatives are:  Insulin Glargine Solostar Lantus Solostar Levemir Flexpen Agricultural engineer Insulin Glargine-ygfn Toujeo Max Solostar [employer]

## 2022-09-29 NOTE — Progress Notes (Signed)
S:    Chief Complaint  Patient presents with   Medication Management    T2DM, insulin teaching   52 y.o. male who presents for diabetes evaluation, education, and management.  PMH is significant for HTN, COPD, newly diagnosed type 2 diabetes, and pancreatic necrosis.  Patient was referred and last seen by Primary Care Provider, Dr. Lajuana Ripple, on 09/25/2022.   At last visit, A1c was elevated 7.7% and due to the complex nature of his pancreatic issues, he was placed on insulin versus oral agents.   Today, patient arrives in good spirits and presents without any assistance.  Patient reports Diabetes was diagnosed in March 2024 in the setting of recurrent pancreatitis.   Current diabetes medications include: Toujeo 10 units daily Current hyperlipidemia medications include: none  Patient reports adherence to taking all medications as prescribed, although he has just picked up his insulin today.   Insurance coverage: Medicaid  Patient denies hypoglycemic events.  Reported home fasting blood sugars: 100-300s; most often 200s   Reported 2 hour post-meal/random blood sugars: 200s. In clinic BG on glucometer: 231 mg/dL   Patient reports nocturia (nighttime urination).  Patient denies neuropathy (nerve pain). Patient reports visual changes - blurry vision when reading.  Patient denies self foot exams.   Patient reported dietary habits: Eats 1 meals/day Breakfast: typically skip  Lunch: typically skip  Dinner: baked chicken thighs, black eyed peas, biscuit Snacks: doesn't  Drinks: water, 2 Mtn. Dew, Large sweet tea from [employer] ~2x/week    O:   Lab Results  Component Value Date   HGBA1C 7.7 (H) 09/25/2022   There were no vitals filed for this visit.  Lipid Panel     Component Value Date/Time   CHOL 218 (H) 10/04/2019 1433   TRIG 47 01/23/2021 1315   HDL 27 (L) 10/04/2019 1433   CHOLHDL 8.1 (H) 10/04/2019 1433   CHOLHDL 6.9 (H) 04/22/2015 0828   VLDL 46 (H)  04/22/2015 0828   LDLCALC 151 (H) 10/04/2019 1433    Clinical Atherosclerotic Cardiovascular Disease (ASCVD): No  The 10-year ASCVD risk score (Arnett DK, et al., 2019) is: 40.2%   Values used to calculate the score:     Age: 31 years     Sex: Male     Is Non-Hispanic African American: No     Diabetic: Yes     Tobacco smoker: Yes     Systolic Blood Pressure: [id number] mmHg     Is BP treated: Yes     HDL Cholesterol: 27 mg/dL     Total Cholesterol: 218 mg/dL   A/P: Diabetes, recently diagnosed, currently above goal based on A1c (7.7%). Patient is able to verbalize appropriate hypoglycemia management plan. Medication adherence appears appropriate. Patient teach back method used for glucometer and insulin administration. -Continued basal insulin Toujeo (insulin glargine) 10 units daily.  -Attempted to place FreeStyle Libre 3 sensor in clinic today; however patient does not have smart phone and has not yet gotten a reader from the pharmacy.  -Patient educated on purpose, proper use, and potential adverse effects of insulin.  -Extensively discussed pathophysiology of diabetes, recommended lifestyle interventions, dietary effects on blood sugar control.  -Counseled on s/sx of and management of hypoglycemia.  -Next A1c anticipated June 2024.   ASCVD risk - primary prevention in patient with newly diagnosed diabetes. Last LDL is 151 (in 2021) not at goal of <70 mg/dL. ASCVD risk factors include T2DM and 10-year ASCVD risk score of 40.2%. moderate intensity statin indicated.  -  Recommend to collect lipid panel and add statin at follow up.   Written patient instructions provided. Patient verbalized understanding of treatment plan.  Total time in face to face counseling 40 minutes.    Follow-up:  Pharmacist ~1 week; once patient picks up FreeStyle [place] reader, instructed him to call the clinic for to schedule an appointment for placement. PCP clinic visit in June 2024.   Valeda Malmachel Faiga Stones,  Pharm.D. PGY-2 Ambulatory Care Pharmacy Resident  Kieth BrightlyJulie Dattero Pruitt, PharmD, BCACP Clinical Pharmacist, [facility]

## 2022-09-30 ENCOUNTER — Other Ambulatory Visit: Payer: Self-pay

## 2022-09-30 DIAGNOSIS — K863 Pseudocyst of pancreas: Secondary | ICD-10-CM

## 2022-09-30 DIAGNOSIS — K8681 Exocrine pancreatic insufficiency: Secondary | ICD-10-CM

## 2022-09-30 DIAGNOSIS — Z8719 Personal history of other diseases of the digestive system: Secondary | ICD-10-CM

## 2022-10-09 ENCOUNTER — Telehealth: Payer: Self-pay | Admitting: Pharmacist

## 2022-10-09 NOTE — Telephone Encounter (Signed)
Please complete libre 3 PA (may need one for reader and sensors) Patient has Type 2 Diabetes DX: E11.65 He is injecting insulin once daily He meets requirements for [facility]  Please call [place] [facility]

## 2022-10-12 ENCOUNTER — Other Ambulatory Visit (HOSPITAL_COMMUNITY): Payer: Self-pay

## 2022-10-12 NOTE — Telephone Encounter (Signed)
Patient Advocate Encounter  Prior Authorization for [employer] sensor has been approved.    Effective dates: 10/12/22 through 04/10/23   Received notification from [facility] Medicaid that the request for prior authorization for [facility] reader has been denied.   Please be advised we currently do not have a Pharmacist to review denials, therefore you will need to process appeals accordingly as needed. Thanks for your support at this time.

## 2022-10-12 NOTE — Telephone Encounter (Signed)
So was it approved or denied? Please clarify

## 2022-10-14 ENCOUNTER — Other Ambulatory Visit: Payer: Medicaid Other

## 2022-10-14 DIAGNOSIS — Z8719 Personal history of other diseases of the digestive system: Secondary | ICD-10-CM

## 2022-10-14 DIAGNOSIS — K863 Pseudocyst of pancreas: Secondary | ICD-10-CM

## 2022-10-14 DIAGNOSIS — K8681 Exocrine pancreatic insufficiency: Secondary | ICD-10-CM

## 2022-10-14 NOTE — Addendum Note (Signed)
Addended by: Lind Covert on: 10/14/2022 08:14 AM   Modules accepted: Orders

## 2022-10-15 LAB — HEPATIC FUNCTION PANEL
ALT: 34 IU/L (ref 0–44)
AST: 14 IU/L (ref 0–40)
Albumin: 4.3 g/dL (ref 3.8–4.9)
Alkaline Phosphatase: 233 IU/L — ABNORMAL HIGH (ref 44–121)
Bilirubin Total: 0.5 mg/dL (ref 0.0–1.2)
Bilirubin, Direct: <0.1 mg/dL (ref 0.00–0.40)
Total Protein: 6.3 g/dL (ref 6.0–8.5)

## 2022-10-19 NOTE — Telephone Encounter (Signed)
Steve Romero have you seen this before where the sensor is approved but the reader is denied?

## 2022-10-20 NOTE — Telephone Encounter (Signed)
Patient Advocate Encounter  Prior Authorization for [employer] 2 Reader has been approved.    PA# [id number] Effective dates: 10/20/22 through 11/19/22

## 2022-10-28 ENCOUNTER — Ambulatory Visit (HOSPITAL_COMMUNITY)
Admission: RE | Admit: 2022-10-28 | Discharge: 2022-10-28 | Disposition: A | Discharge status: Home / Self Care | Payer: Medicaid Other | Source: Ambulatory Visit | Attending: Gastroenterology | Admitting: Gastroenterology

## 2022-10-28 DIAGNOSIS — Z8719 Personal history of other diseases of the digestive system: Secondary | ICD-10-CM | POA: Insufficient documentation

## 2022-10-28 DIAGNOSIS — K863 Pseudocyst of pancreas: Secondary | ICD-10-CM | POA: Insufficient documentation

## 2022-10-28 DIAGNOSIS — K8681 Exocrine pancreatic insufficiency: Secondary | ICD-10-CM | POA: Diagnosis not present

## 2022-10-28 MED ORDER — IOHEXOL 300 MG/ML  SOLN
100.0000 mL | Freq: Once | INTRAMUSCULAR | Status: AC | PRN
  Administered 2022-10-28: 100 mL via INTRAVENOUS

## 2022-10-28 MED ORDER — SODIUM CHLORIDE (PF) 0.9 % IJ SOLN
INTRAMUSCULAR | Status: AC
  Filled 2022-10-28: qty 50

## 2022-10-29 ENCOUNTER — Telehealth: Payer: Self-pay | Admitting: Pharmacist

## 2022-10-29 ENCOUNTER — Telehealth: Payer: Self-pay

## 2022-10-29 NOTE — Telephone Encounter (Signed)
PA denied due to patient not requiring 2 or more insulin injections daily or use an external insulin pump.    

## 2022-10-29 NOTE — Telephone Encounter (Signed)
Please and completed PA on BOTH libre 3 reader and device Patient is on insulin once daily DX: E11.9 Must call [place] [facility]

## 2022-10-29 NOTE — Telephone Encounter (Signed)
PA denied due to patient not requiring 2 or more insulin injections daily or use an external insulin pump.

## 2022-10-29 NOTE — Telephone Encounter (Signed)
Submitted PA via [facility]  Confirmation#: [id number]

## 2022-10-29 NOTE — Telephone Encounter (Signed)
PA submitted via [employer]  Confirmation #: [id number]

## 2022-11-03 ENCOUNTER — Other Ambulatory Visit (HOSPITAL_COMMUNITY): Payer: Self-pay

## 2022-11-03 ENCOUNTER — Telehealth: Payer: Self-pay | Admitting: Gastroenterology

## 2022-11-03 NOTE — Telephone Encounter (Signed)
Patient Advocate Encounter  Prior Authorization for [employer] and Reader has been approved with [place] Medicaid.

## 2022-11-03 NOTE — Telephone Encounter (Signed)
Inbound call from patient requesting a call back from a nurse. Patient want to go over CT results. Please advise.

## 2022-11-03 NOTE — Telephone Encounter (Signed)
The pt has been advised that as soon as CT has been reviewed we will call him with results and recommendations

## 2022-11-03 NOTE — Telephone Encounter (Signed)
Patty, Please let the patient know that I have reviewed and/or patient's sister/mother the CT scan. The bile duct looks to be slightly stretched out/dilated but this can be seen in the setting of his chronic inflammation. the patient also has evidence of some slight nodularity in regards to his liver. The pancreas shows some mild dilation as well as a small recurrent fluid collection. I will discuss this further with Dr. Freida Busman, I am not sure that it requires [place] to move forward with surgery as of yet but we will need to keep this in mind. 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 I would like to have a chance to talk with you in the next few weeks to see where things stand overall.  Your primary care doctor has continued to keep me up-to-date with your laboratories.  Lets plan a follow-up in clinic with me (okay to double book next available).  FYI [facility] and SA.

## 2022-11-05 NOTE — Telephone Encounter (Signed)
[facility] 3 reader) and it was denied Can you f/u with [place] [facility]? Message below states both were covered

## 2022-11-05 NOTE — Telephone Encounter (Signed)
Patient calling because he called [facility] and was told that the reader was still not approved at this time.

## 2022-11-05 NOTE — Telephone Encounter (Signed)
Called patient re: approval Instructed [facility] to [facility] to fill[facility]order He will call back to schedule an appt with me for [place] education

## 2022-11-06 ENCOUNTER — Other Ambulatory Visit (HOSPITAL_COMMUNITY): Payer: Self-pay

## 2022-11-06 NOTE — Telephone Encounter (Signed)
Wow! So the libre 3 reader and sensors were filled? Thanks!

## 2022-11-06 NOTE — Telephone Encounter (Signed)
Looks like it might have been a day supply issue as [place] Medicaid will only cover 1 month at a time. Per test claim, prescription was filled at [facility] 11/05/2022

## 2022-11-09 ENCOUNTER — Other Ambulatory Visit (HOSPITAL_COMMUNITY): Payer: Self-pay

## 2022-11-09 NOTE — Telephone Encounter (Signed)
I called pt's pharmacy just to be sure, he was able to pick up the sensors, but the reader was still showing PA required. I've taken care of it with the pharmacist and they are getting the reader ready for the pt and will contact him when it is ready to be picked up.

## 2022-11-10 ENCOUNTER — Telehealth: Payer: Self-pay | Admitting: Gastroenterology

## 2022-11-10 NOTE — Telephone Encounter (Signed)
The pt has been advised of the results of the CT scan. Appt made to see Dr Meridee Score on 01/20/23 at 930 am to discuss further

## 2022-11-10 NOTE — Telephone Encounter (Signed)
Patient called regarding CT results. Requesting a call back to discuss results. Patient says if you cannot reach him, it is okay to call Sister. Please advise, thank you.

## 2022-11-10 NOTE — Telephone Encounter (Signed)
Arieana Somoza, Please let the patient know that I have reviewed and/or patient's sister/mother the CT scan. The bile duct looks to be slightly stretched out/dilated but this can be seen in the setting of his chronic inflammation. the patient also has evidence of some slight nodularity in regards to his liver. The pancreas shows some mild dilation as well as a small recurrent fluid collection. I will discuss this further with Dr. Allen, I am not sure that it requires us to move forward with surgery as of yet but we will need to keep this in mind. 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 I would like to have a chance to talk with you in the next few weeks to see where things stand overall.  Your primary care doctor has continued to keep me up-to-date with your laboratories.  Lets plan a follow-up in clinic with me (okay to double book next available).  FYI AMG and SA.  

## 2022-11-12 ENCOUNTER — Inpatient Hospital Stay (HOSPITAL_COMMUNITY)
Admission: EM | Admit: 2022-11-12 | Discharge: 2022-11-23 | DRG: 405 | Disposition: A | Discharge status: Home Health | Payer: Medicaid Other | Attending: Surgery | Admitting: Surgery

## 2022-11-12 ENCOUNTER — Emergency Department (HOSPITAL_COMMUNITY): Payer: Medicaid Other

## 2022-11-12 ENCOUNTER — Encounter (HOSPITAL_COMMUNITY): Payer: Self-pay | Admitting: Internal Medicine

## 2022-11-12 ENCOUNTER — Other Ambulatory Visit: Payer: Self-pay

## 2022-11-12 DIAGNOSIS — N400 Enlarged prostate without lower urinary tract symptoms: Secondary | ICD-10-CM | POA: Diagnosis present

## 2022-11-12 DIAGNOSIS — K7460 Unspecified cirrhosis of liver: Secondary | ICD-10-CM | POA: Diagnosis present

## 2022-11-12 DIAGNOSIS — D7389 Other diseases of spleen: Secondary | ICD-10-CM | POA: Diagnosis present

## 2022-11-12 DIAGNOSIS — K838 Other specified diseases of biliary tract: Secondary | ICD-10-CM | POA: Diagnosis present

## 2022-11-12 DIAGNOSIS — K861 Other chronic pancreatitis: Secondary | ICD-10-CM | POA: Diagnosis present

## 2022-11-12 DIAGNOSIS — K862 Cyst of pancreas: Secondary | ICD-10-CM | POA: Diagnosis not present

## 2022-11-12 DIAGNOSIS — K9181 Other intraoperative complications of digestive system: Secondary | ICD-10-CM | POA: Diagnosis not present

## 2022-11-12 DIAGNOSIS — F259 Schizoaffective disorder, unspecified: Secondary | ICD-10-CM | POA: Diagnosis present

## 2022-11-12 DIAGNOSIS — K8681 Exocrine pancreatic insufficiency: Secondary | ICD-10-CM | POA: Diagnosis present

## 2022-11-12 DIAGNOSIS — I4719 Other supraventricular tachycardia: Secondary | ICD-10-CM | POA: Diagnosis present

## 2022-11-12 DIAGNOSIS — I10 Essential (primary) hypertension: Secondary | ICD-10-CM | POA: Diagnosis present

## 2022-11-12 DIAGNOSIS — E1149 Type 2 diabetes mellitus with other diabetic neurological complication: Secondary | ICD-10-CM | POA: Diagnosis not present

## 2022-11-12 DIAGNOSIS — G8929 Other chronic pain: Secondary | ICD-10-CM

## 2022-11-12 DIAGNOSIS — T380X5A Adverse effect of glucocorticoids and synthetic analogues, initial encounter: Secondary | ICD-10-CM | POA: Diagnosis not present

## 2022-11-12 DIAGNOSIS — K831 Obstruction of bile duct: Secondary | ICD-10-CM | POA: Diagnosis not present

## 2022-11-12 DIAGNOSIS — E1165 Type 2 diabetes mellitus with hyperglycemia: Secondary | ICD-10-CM | POA: Diagnosis not present

## 2022-11-12 DIAGNOSIS — Z8249 Family history of ischemic heart disease and other diseases of the circulatory system: Secondary | ICD-10-CM | POA: Diagnosis not present

## 2022-11-12 DIAGNOSIS — K311 Adult hypertrophic pyloric stenosis: Secondary | ICD-10-CM | POA: Diagnosis present

## 2022-11-12 DIAGNOSIS — Z794 Long term (current) use of insulin: Secondary | ICD-10-CM | POA: Diagnosis not present

## 2022-11-12 DIAGNOSIS — Y838 Other surgical procedures as the cause of abnormal reaction of the patient, or of later complication, without mention of misadventure at the time of the procedure: Secondary | ICD-10-CM | POA: Diagnosis present

## 2022-11-12 DIAGNOSIS — Z79899 Other long term (current) drug therapy: Secondary | ICD-10-CM

## 2022-11-12 DIAGNOSIS — Z833 Family history of diabetes mellitus: Secondary | ICD-10-CM

## 2022-11-12 DIAGNOSIS — F17210 Nicotine dependence, cigarettes, uncomplicated: Secondary | ICD-10-CM | POA: Diagnosis present

## 2022-11-12 DIAGNOSIS — D62 Acute posthemorrhagic anemia: Secondary | ICD-10-CM | POA: Diagnosis not present

## 2022-11-12 DIAGNOSIS — E785 Hyperlipidemia, unspecified: Secondary | ICD-10-CM | POA: Diagnosis present

## 2022-11-12 DIAGNOSIS — J449 Chronic obstructive pulmonary disease, unspecified: Secondary | ICD-10-CM | POA: Diagnosis present

## 2022-11-12 DIAGNOSIS — Z23 Encounter for immunization: Secondary | ICD-10-CM

## 2022-11-12 DIAGNOSIS — F319 Bipolar disorder, unspecified: Secondary | ICD-10-CM | POA: Diagnosis present

## 2022-11-12 DIAGNOSIS — E119 Type 2 diabetes mellitus without complications: Secondary | ICD-10-CM

## 2022-11-12 DIAGNOSIS — K8689 Other specified diseases of pancreas: Secondary | ICD-10-CM | POA: Diagnosis present

## 2022-11-12 DIAGNOSIS — E871 Hypo-osmolality and hyponatremia: Secondary | ICD-10-CM | POA: Diagnosis present

## 2022-11-12 DIAGNOSIS — E663 Overweight: Secondary | ICD-10-CM

## 2022-11-12 DIAGNOSIS — R7989 Other specified abnormal findings of blood chemistry: Secondary | ICD-10-CM | POA: Diagnosis not present

## 2022-11-12 DIAGNOSIS — K863 Pseudocyst of pancreas: Secondary | ICD-10-CM | POA: Diagnosis present

## 2022-11-12 DIAGNOSIS — G4733 Obstructive sleep apnea (adult) (pediatric): Secondary | ICD-10-CM | POA: Diagnosis present

## 2022-11-12 DIAGNOSIS — K858 Other acute pancreatitis without necrosis or infection: Secondary | ICD-10-CM | POA: Diagnosis present

## 2022-11-12 DIAGNOSIS — Z6829 Body mass index (BMI) 29.0-29.9, adult: Secondary | ICD-10-CM

## 2022-11-12 DIAGNOSIS — Z9049 Acquired absence of other specified parts of digestive tract: Secondary | ICD-10-CM

## 2022-11-12 DIAGNOSIS — K859 Acute pancreatitis without necrosis or infection, unspecified: Secondary | ICD-10-CM | POA: Diagnosis not present

## 2022-11-12 DIAGNOSIS — Z823 Family history of stroke: Secondary | ICD-10-CM

## 2022-11-12 DIAGNOSIS — Z888 Allergy status to other drugs, medicaments and biological substances status: Secondary | ICD-10-CM

## 2022-11-12 LAB — CBC WITH DIFFERENTIAL/PLATELET
Abs Immature Granulocytes: 0.05 10*3/uL (ref 0.00–0.07)
Basophils Absolute: 0.1 10*3/uL (ref 0.0–0.1)
Basophils Relative: 1 %
Eosinophils Absolute: 0.1 10*3/uL (ref 0.0–0.5)
Eosinophils Relative: 1 %
HCT: 51.3 % (ref 39.0–52.0)
Hemoglobin: 18.3 g/dL — ABNORMAL HIGH (ref 13.0–17.0)
Immature Granulocytes: 1 %
Lymphocytes Relative: 17 %
Lymphs Abs: 1.8 10*3/uL (ref 0.7–4.0)
MCH: 30 pg (ref 26.0–34.0)
MCHC: 35.7 g/dL (ref 30.0–36.0)
MCV: 84.2 fL (ref 80.0–100.0)
Monocytes Absolute: 0.6 10*3/uL (ref 0.1–1.0)
Monocytes Relative: 6 %
Neutro Abs: 7.6 10*3/uL (ref 1.7–7.7)
Neutrophils Relative %: 74 %
Platelets: 295 10*3/uL (ref 150–400)
RBC: 6.09 MIL/uL — ABNORMAL HIGH (ref 4.22–5.81)
RDW: 12 % (ref 11.5–15.5)
WBC: 10.2 10*3/uL (ref 4.0–10.5)
nRBC: 0 % (ref 0.0–0.2)

## 2022-11-12 LAB — COMPREHENSIVE METABOLIC PANEL
ALT: 42 U/L (ref 0–44)
AST: 21 U/L (ref 15–41)
Albumin: 4.3 g/dL (ref 3.5–5.0)
Alkaline Phosphatase: 235 U/L — ABNORMAL HIGH (ref 38–126)
Anion gap: 14 (ref 5–15)
BUN: 23 mg/dL — ABNORMAL HIGH (ref 6–20)
CO2: 21 mmol/L — ABNORMAL LOW (ref 22–32)
Calcium: 8.8 mg/dL — ABNORMAL LOW (ref 8.9–10.3)
Chloride: 97 mmol/L — ABNORMAL LOW (ref 98–111)
Creatinine, Ser: 1.11 mg/dL (ref 0.61–1.24)
GFR, Estimated: >60 mL/min (ref >60)
Glucose, Bld: 343 mg/dL — ABNORMAL HIGH (ref 70–99)
Potassium: 3.5 mmol/L (ref 3.5–5.1)
Sodium: 132 mmol/L — ABNORMAL LOW (ref 135–145)
Total Bilirubin: 1.2 mg/dL (ref 0.3–1.2)
Total Protein: 7.3 g/dL (ref 6.5–8.1)

## 2022-11-12 LAB — TROPONIN I (HIGH SENSITIVITY)
Troponin I (High Sensitivity): 3 ng/L (ref <18)
Troponin I (High Sensitivity): 4 ng/L (ref <18)

## 2022-11-12 LAB — LIPASE, BLOOD: Lipase: 152 U/L — ABNORMAL HIGH (ref 11–51)

## 2022-11-12 LAB — GLUCOSE, CAPILLARY
Glucose-Capillary: 205 mg/dL — ABNORMAL HIGH (ref 70–99)
Glucose-Capillary: 149 mg/dL — ABNORMAL HIGH (ref 70–99)

## 2022-11-12 LAB — CBG MONITORING, ED: Glucose-Capillary: 303 mg/dL — ABNORMAL HIGH (ref 70–99)

## 2022-11-12 MED ORDER — POLYETHYLENE GLYCOL 3350 17 G PO PACK: 17.0000 g | PACK | Freq: Every day | ORAL | Status: DC | PRN

## 2022-11-12 MED ORDER — SODIUM CHLORIDE (PF) 0.9 % IJ SOLN
INTRAMUSCULAR | Status: AC
  Filled 2022-11-12: qty 50

## 2022-11-12 MED ORDER — INSULIN ASPART 100 UNIT/ML IJ SOLN
0.0000 [IU] | INTRAMUSCULAR | Status: DC
  Administered 2022-11-12: 11 [IU] via SUBCUTANEOUS
  Administered 2022-11-12: 5 [IU] via SUBCUTANEOUS
  Filled 2022-11-12: qty 0.15

## 2022-11-12 MED ORDER — SODIUM CHLORIDE 0.9 % IV SOLN: INTRAVENOUS | Status: AC

## 2022-11-12 MED ORDER — IOHEXOL 300 MG/ML  SOLN
100.0000 mL | Freq: Once | INTRAMUSCULAR | Status: AC | PRN
  Administered 2022-11-12: 100 mL via INTRAVENOUS

## 2022-11-12 MED ORDER — ACETAMINOPHEN 325 MG PO TABS: 650.0000 mg | ORAL_TABLET | Freq: Four times a day (QID) | ORAL | Status: DC | PRN

## 2022-11-12 MED ORDER — DOCUSATE SODIUM 100 MG PO CAPS
100.0000 mg | ORAL_CAPSULE | Freq: Two times a day (BID) | ORAL | Status: DC
  Administered 2022-11-12 – 2022-11-23 (×15): 100 mg via ORAL
  Filled 2022-11-12 (×20): qty 1

## 2022-11-12 MED ORDER — INSULIN ASPART 100 UNIT/ML IJ SOLN
0.0000 [IU] | Freq: Three times a day (TID) | INTRAMUSCULAR | Status: DC
  Administered 2022-11-12: 2 [IU] via SUBCUTANEOUS
  Administered 2022-11-13 – 2022-11-14 (×5): 3 [IU] via SUBCUTANEOUS

## 2022-11-12 MED ORDER — TRAZODONE HCL 50 MG PO TABS
25.0000 mg | ORAL_TABLET | Freq: Every evening | ORAL | Status: DC | PRN
  Filled 2022-11-12: qty 1

## 2022-11-12 MED ORDER — ONDANSETRON HCL 4 MG PO TABS: 4.0000 mg | ORAL_TABLET | Freq: Four times a day (QID) | ORAL | Status: DC | PRN

## 2022-11-12 MED ORDER — LACTATED RINGERS IV BOLUS
1000.0000 mL | Freq: Once | INTRAVENOUS | Status: AC
  Administered 2022-11-12: 1000 mL via INTRAVENOUS

## 2022-11-12 MED ORDER — HYDROMORPHONE HCL 1 MG/ML IJ SOLN
0.5000 mg | INTRAMUSCULAR | Status: DC | PRN
  Administered 2022-11-12 – 2022-11-14 (×21): 1 mg via INTRAVENOUS
  Filled 2022-11-12 (×21): qty 1

## 2022-11-12 MED ORDER — INSULIN GLARGINE (1 UNIT DIAL) 300 UNIT/ML ~~LOC~~ SOPN: 5.0000 [IU] | PEN_INJECTOR | Freq: Every day | SUBCUTANEOUS | Status: DC

## 2022-11-12 MED ORDER — HYDRALAZINE HCL 20 MG/ML IJ SOLN
10.0000 mg | Freq: Four times a day (QID) | INTRAMUSCULAR | Status: DC | PRN
  Administered 2022-11-17: 10 mg via INTRAVENOUS
  Filled 2022-11-12: qty 1

## 2022-11-12 MED ORDER — ONDANSETRON HCL 4 MG/2ML IJ SOLN
4.0000 mg | Freq: Four times a day (QID) | INTRAMUSCULAR | Status: DC | PRN
  Administered 2022-11-13 – 2022-11-14 (×3): 4 mg via INTRAVENOUS
  Filled 2022-11-12 (×3): qty 2

## 2022-11-12 MED ORDER — ACETAMINOPHEN 650 MG RE SUPP: 650.0000 mg | Freq: Four times a day (QID) | RECTAL | Status: DC | PRN

## 2022-11-12 MED ORDER — HYDROMORPHONE HCL 1 MG/ML IJ SOLN
1.0000 mg | Freq: Once | INTRAMUSCULAR | Status: AC
  Administered 2022-11-12: 1 mg via INTRAVENOUS
  Filled 2022-11-12: qty 1

## 2022-11-12 MED ORDER — INSULIN GLARGINE-YFGN 100 UNIT/ML ~~LOC~~ SOLN
5.0000 [IU] | Freq: Every day | SUBCUTANEOUS | Status: DC
  Administered 2022-11-12 – 2022-11-13 (×2): 5 [IU] via SUBCUTANEOUS
  Filled 2022-11-12 (×3): qty 0.05

## 2022-11-12 MED ORDER — ALBUTEROL SULFATE (2.5 MG/3ML) 0.083% IN NEBU: 2.5000 mg | INHALATION_SOLUTION | RESPIRATORY_TRACT | Status: DC | PRN

## 2022-11-12 MED ORDER — ENOXAPARIN SODIUM 40 MG/0.4ML IJ SOSY
40.0000 mg | PREFILLED_SYRINGE | Freq: Every day | INTRAMUSCULAR | Status: DC
  Administered 2022-11-12 – 2022-11-15 (×4): 40 mg via SUBCUTANEOUS
  Filled 2022-11-12 (×5): qty 0.4

## 2022-11-12 NOTE — ED Notes (Signed)
ED TO INPATIENT HANDOFF REPORT  ED Nurse Name and Phone #: Suzanna Obey [phone]  S Name/Age/Gender Steve Romero 52 y.o. male Room/Bed: WA06/WA06  Code Status   Code Status: Full Code  Home/SNF/Other Home Patient oriented to: self, place, time, and situation Is this baseline? Yes   Triage Complete: Triage complete  Chief Complaint 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.3]  Triage Note Pt reports LUQ and RUQ pain that "never stops just got worse today" Hx Pancreatitis    Allergies Allergies  Allergen Reactions   Desipramine Nausea Only and Rash    Level of Care/Admitting Diagnosis ED Disposition     ED Disposition  Admit   Condition  --   Comment  [facility]: [facility] [[id number]]  Level of Care: Med-Surg [16]  May admit patient to [facility] or [facility] if equivalent level of care is available:: Yes  Covid Evaluation: Asymptomatic - no recent exposure (last 10 days) testing not required  Diagnosis: Pancreatic pseudocyst [202658]  Admitting Physician: Maryln Gottron [[id number]]  Attending Physician: Kirby Crigler, MIR Jaxson.Roy [[id number]]  Certification:: I certify this patient will need inpatient services for at least 2 midnights  Estimated Length of Stay: 3          B Medical/Surgery History Past Medical History:  Diagnosis Date   Acute pancreatitis without necrosis or infection, unspecified    AKI (acute kidney injury) (HCC) 05/17/2022   Anxiety 10/2014   Ascites    Bipolar disorder (HCC) age 36   COPD (chronic obstructive pulmonary disease) (HCC) 2016   Depression age 37   Enlarged prostate    Hyperlipidemia 2016   Hypertension 2013   Nausea vomiting and diarrhea 06/15/2022   Schizoaffective disorder (HCC) 11/13/2014   Sleep apnea    Does not wear c-pap, sleeps in sitting up position per pt   Thyroid disease 11/2014   Past Surgical History:  Procedure Laterality Date   BALLOON DILATION N/A 11/07/2020   Procedure: BALLOON DILATION;   Surgeon: Lemar Lofty., MD;  Location[facility] ENDOSCOPY;  Service: Gastroenterology;  Laterality: N/A;   BALLOON DILATION N/A 06/26/2021   Procedure: BALLOON DILATION;  Surgeon: Meridee Score Netty Starring., MD;  Location: [facility] ENDOSCOPY;  Service: Gastroenterology;  Laterality: N/A;   BILIARY DILATION  07/24/2021   Procedure: BILIARY DILATION;  Surgeon: Meridee Score Netty Starring., MD;  Location: [facility] ENDOSCOPY;  Service: Gastroenterology;;   BILIARY DILATION  05/22/2022   Procedure: BILIARY DILATION;  Surgeon: Lemar Lofty., MD;  Location: [facility] ENDOSCOPY;  Service: Gastroenterology;;   BILIARY DILATION  06/16/2022   Procedure: GASTROSTOMY DILATION;  Surgeon: Lemar Lofty., MD;  Location: [facility] ENDOSCOPY;  Service: Gastroenterology;;   BILIARY STENT PLACEMENT N/A 10/10/2020   Procedure: BILIARY STENT PLACEMENT;  Surgeon: Malissa Hippo, MD;  Location: [facility];  Service: Endoscopy;  Laterality: N/A;   BILIARY STENT PLACEMENT  11/09/2020   Procedure: BILIARY STENT PLACEMENT;  Surgeon: Meridee Score Netty Starring., MD;  Location: [facility] ENDOSCOPY;  Service: Gastroenterology;;   BILIARY STENT PLACEMENT  11/07/2020   Procedure: BILIARY STENT PLACEMENT;  Surgeon: Lemar Lofty., MD;  Location: [facility] ENDOSCOPY;  Service: Gastroenterology;;   BILIARY STENT PLACEMENT  11/11/2020   Procedure: BILIARY STENT PLACEMENT;  Surgeon: Lemar Lofty., MD;  Location: [facility] ENDOSCOPY;  Service: Gastroenterology;;   BILIARY STENT PLACEMENT  11/14/2020   Procedure: BILIARY STENT PLACEMENT;  Surgeon: Lemar Lofty., MD;  Location: [facility] ENDOSCOPY;  Service: Gastroenterology;;   BILIARY STENT PLACEMENT N/A 01/29/2021   Procedure:  BILIARY STENT PLACEMENT;  Surgeon: Meridee Score Netty Starring., MD;  Location: [facility] ENDOSCOPY;  Service: Gastroenterology;  Laterality: N/A;   BILIARY STENT PLACEMENT  06/26/2021   Procedure: BILIARY STENT PLACEMENT;  Surgeon: Meridee Score Netty Starring., MD;  Location: [facility] ENDOSCOPY;   Service: Gastroenterology;;   BILIARY STENT PLACEMENT  07/24/2021   Procedure: BILIARY STENT PLACEMENT;  Surgeon: Lemar Lofty., MD;  Location: [facility] ENDOSCOPY;  Service: Gastroenterology;;   BIOPSY  11/07/2020   Procedure: BIOPSY;  Surgeon: Lemar Lofty., MD;  Location: [facility] ENDOSCOPY;  Service: Gastroenterology;;   BIOPSY  12/20/2020   Procedure: BIOPSY;  Surgeon: Lemar Lofty., MD;  Location: [facility] ENDOSCOPY;  Service: Gastroenterology;;   BIOPSY  06/26/2021   Procedure: BIOPSY;  Surgeon: Lemar Lofty., MD;  Location: [facility] ENDOSCOPY;  Service: Gastroenterology;;   CARPAL TUNNEL RELEASE Left 02/21/2016   Procedure: CARPAL TUNNEL RELEASE;  Surgeon: Vickki Hearing, MD;  Location: [facility];  Service: Orthopedics;  Laterality: Left;   CHOLECYSTECTOMY N/A 10/09/2020   Procedure: LAPAROSCOPIC CHOLECYSTECTOMY;  Surgeon: Lucretia Roers, MD;  Location: [facility];  Service: General;  Laterality: N/A;   CYST ENTEROSTOMY N/A 11/07/2020   Procedure: CYST ENTEROSTOMY;  Surgeon: Lemar Lofty., MD;  Location: [facility] ENDOSCOPY;  Service: Gastroenterology;  Laterality: N/A;   CYST GASTROSTOMY  11/11/2020   Procedure: CYST NECROSECTOMY;  Surgeon: Meridee Score Netty Starring., MD;  Location: [facility] ENDOSCOPY;  Service: Gastroenterology;;   CYST GASTROSTOMY  11/14/2020   Procedure: CYST NECROSECTOMY;  Surgeon: Lemar Lofty., MD;  Location: [facility] ENDOSCOPY;  Service: Gastroenterology;;   CYST GASTROSTOMY  06/16/2022   Procedure: CYST GASTROSTOMY;  Surgeon: Lemar Lofty., MD;  Location: [facility] ENDOSCOPY;  Service: Gastroenterology;;   CYST REMOVAL HAND     CYSTOSCOPY  06/26/2021   Procedure: CYSTOGASTROSTOMY;  Surgeon: Mansouraty, Netty Starring., MD;  Location: [facility] ENDOSCOPY;  Service: Gastroenterology;;   ENDOSCOPIC RETROGRADE CHOLANGIOPANCREATOGRAPHY (ERCP) WITH PROPOFOL N/A 11/09/2020   Procedure: ENDOSCOPIC RETROGRADE CHOLANGIOPANCREATOGRAPHY (ERCP) WITH PROPOFOL;   Surgeon: Lemar Lofty., MD;  Location: [facility] ENDOSCOPY;  Service: Gastroenterology;  Laterality: N/A;   ENDOSCOPIC RETROGRADE CHOLANGIOPANCREATOGRAPHY (ERCP) WITH PROPOFOL N/A 12/20/2020   Procedure: ENDOSCOPIC RETROGRADE CHOLANGIOPANCREATOGRAPHY (ERCP) WITH PROPOFOL;  Surgeon: Meridee Score Netty Starring., MD;  Location: [facility] ENDOSCOPY;  Service: Gastroenterology;  Laterality: N/A;   ENDOSCOPIC RETROGRADE CHOLANGIOPANCREATOGRAPHY (ERCP) WITH PROPOFOL N/A 01/29/2021   Procedure: ENDOSCOPIC RETROGRADE CHOLANGIOPANCREATOGRAPHY (ERCP) WITH PROPOFOL;  Surgeon: Meridee Score Netty Starring., MD;  Location: [facility] ENDOSCOPY;  Service: Gastroenterology;  Laterality: N/A;   ENDOSCOPIC RETROGRADE CHOLANGIOPANCREATOGRAPHY (ERCP) WITH PROPOFOL N/A 07/24/2021   Procedure: ENDOSCOPIC RETROGRADE CHOLANGIOPANCREATOGRAPHY (ERCP) WITH PROPOFOL;  Surgeon: Meridee Score Netty Starring., MD;  Location: [facility] ENDOSCOPY;  Service: Gastroenterology;  Laterality: N/A;   ERCP N/A 10/10/2020   Procedure: ENDOSCOPIC RETROGRADE CHOLANGIOPANCREATOGRAPHY (ERCP);  Surgeon: Malissa Hippo, MD;  Location: [facility];  Service: Endoscopy;  Laterality: N/A;   ERCP     ERCP N/A 05/22/2022   Procedure: ENDOSCOPIC RETROGRADE CHOLANGIOPANCREATOGRAPHY (ERCP);  Surgeon: Lemar Lofty., MD;  Location: [facility] ENDOSCOPY;  Service: Gastroenterology;  Laterality: N/A;   ESOPHAGOGASTRODUODENOSCOPY N/A 11/11/2020   Procedure: ESOPHAGOGASTRODUODENOSCOPY (EGD);  Surgeon: Lemar Lofty., MD;  Location: [facility] ENDOSCOPY;  Service: Gastroenterology;  Laterality: N/A;   ESOPHAGOGASTRODUODENOSCOPY N/A 11/14/2020   Procedure: ESOPHAGOGASTRODUODENOSCOPY (EGD);  Surgeon: Lemar Lofty., MD;  Location: [facility] ENDOSCOPY;  Service: Gastroenterology;  Laterality: N/A;   ESOPHAGOGASTRODUODENOSCOPY (EGD) WITH PROPOFOL N/A 11/09/2020   Procedure: ESOPHAGOGASTRODUODENOSCOPY (EGD) WITH PROPOFOL;  Surgeon: Meridee Score Netty Starring., MD;  Location: [facility] ENDOSCOPY;  Service:  Gastroenterology;  Laterality: N/A;   ESOPHAGOGASTRODUODENOSCOPY (EGD) WITH PROPOFOL N/A 11/07/2020   Procedure: ESOPHAGOGASTRODUODENOSCOPY (EGD) WITH PROPOFOL;  Surgeon: Meridee Score Netty Starring., MD;  Location: [facility] ENDOSCOPY;  Service: Gastroenterology;  Laterality: N/A;   ESOPHAGOGASTRODUODENOSCOPY (EGD) WITH PROPOFOL N/A 12/20/2020   Procedure: ESOPHAGOGASTRODUODENOSCOPY (EGD) WITH PROPOFOL;  Surgeon: Meridee Score Netty Starring., MD;  Location: [facility] ENDOSCOPY;  Service: Gastroenterology;  Laterality: N/A;   ESOPHAGOGASTRODUODENOSCOPY (EGD) WITH PROPOFOL N/A 06/26/2021   Procedure: ESOPHAGOGASTRODUODENOSCOPY (EGD) WITH PROPOFOL;  Surgeon: Meridee Score Netty Starring., MD;  Location: [facility] ENDOSCOPY;  Service: Gastroenterology;  Laterality: N/A;   ESOPHAGOGASTRODUODENOSCOPY (EGD) WITH PROPOFOL N/A 07/24/2021   Procedure: ESOPHAGOGASTRODUODENOSCOPY (EGD) WITH PROPOFOL;  Surgeon: Meridee Score Netty Starring., MD;  Location: [facility] ENDOSCOPY;  Service: Gastroenterology;  Laterality: N/A;  AXIOS STENT   ESOPHAGOGASTRODUODENOSCOPY (EGD) WITH PROPOFOL N/A 05/22/2022   Procedure: ESOPHAGOGASTRODUODENOSCOPY (EGD) WITH PROPOFOL;  Surgeon: Meridee Score Netty Starring., MD;  Location[facility] ENDOSCOPY;  Service: Gastroenterology;  Laterality: N/A;   ESOPHAGOGASTRODUODENOSCOPY (EGD) WITH PROPOFOL N/A 06/16/2022   Procedure: ESOPHAGOGASTRODUODENOSCOPY (EGD) WITH PROPOFOL;  Surgeon: Meridee Score Netty Starring., MD;  Location: [facility] ENDOSCOPY;  Service: Gastroenterology;  Laterality: N/A;   ESOPHAGOGASTRODUODENOSCOPY (EGD) WITH PROPOFOL N/A 07/30/2022   Procedure: ESOPHAGOGASTRODUODENOSCOPY (EGD) WITH PROPOFOL;  Surgeon: Meridee Score Netty Starring., MD;  Location: [facility] ENDOSCOPY;  Service: Gastroenterology;  Laterality: N/A;   EUS  11/14/2020   Procedure: UPPER ENDOSCOPIC ULTRASOUND (EUS) LINEAR;  Surgeon: Lemar Lofty., MD;  Location: [facility] ENDOSCOPY;  Service: Gastroenterology;;   EUS N/A 06/26/2021   Procedure: UPPER ENDOSCOPIC ULTRASOUND (EUS)  RADIAL;  Surgeon: Lemar Lofty., MD;  Location: [facility] ENDOSCOPY;  Service: Gastroenterology;  Laterality: N/A;   EUS N/A 05/22/2022   Procedure: UPPER ENDOSCOPIC ULTRASOUND (EUS) LINEAR;  Surgeon: Lemar Lofty., MD;  Location: [facility]OSCOPY;  Service: Gastroenterology;  Laterality: N/A;   EUS N/A 06/16/2022   Procedure: UPPER ENDOSCOPIC ULTRASOUND (EUS) LINEAR;  Surgeon: Lemar Lofty., MD;  Location: [facility] ENDOSCOPY;  Service: Gastroenterology;  Laterality: N/A;   FINE NEEDLE ASPIRATION  05/22/2022   Procedure: FINE NEEDLE ASPIRATION;  Surgeon: Meridee Score Netty Starring., MD;  Location: [facility] ENDOSCOPY;  Service: Gastroenterology;;   GASTROINTESTINAL STENT REMOVAL  12/20/2020   Procedure: GASTROINTESTINAL STENT REMOVAL;  Surgeon: Lemar Lofty., MD;  Location: [facility] ENDOSCOPY;  Service: Gastroenterology;;  cyst gastrostomy stent and double pig tail stents x2 removed   HERNIA REPAIR Left 2002   groin   INGUINAL HERNIA REPAIR Left 04/05/2017   Procedure: RECURRENT HERNIA REPAIR INGUINAL ADULT WITH MESH;  Surgeon: Franky Macho, MD;  Location: [facility];  Service: General;  Laterality: Left;   MASS EXCISION Right 04/29/2020   Procedure: EXCISION MASS RIGHT WRIST;  Surgeon: Betha Loa, MD;  Location: [facility];  Service: Orthopedics;  Laterality: Right;   MASS EXCISION Right 10/09/2020   Procedure: EXCISION MASS, ABDOMINAL WALL, 2CM;  Surgeon: Lucretia Roers, MD;  Location: [facility];  Service: General;  Laterality: Right;   PANCREATIC STENT PLACEMENT  06/26/2021   Procedure: AXIOS STENT PLACEMENT;  Surgeon: Lemar Lofty., MD;  Location: [facility] ENDOSCOPY;  Service: Gastroenterology;;   PANCREATIC STENT PLACEMENT  06/16/2022   Procedure: PANCREATIC STENT PLACEMENT;  Surgeon: Lemar Lofty., MD;  Location: [facility] ENDOSCOPY;  Service: Gastroenterology;;   REMOVAL OF STONES  12/20/2020   Procedure: REMOVAL OF STONES;  Surgeon: Lemar Lofty.,  MD;  Location: [facility] ENDOSCOPY;  Service: Gastroenterology;;   REMOVAL OF STONES  07/24/2021   Procedure: REMOVAL OF STONES;  Surgeon: Corliss Parish  Montez Hageman., MD;  Location: [facility] ENDOSCOPY;  Service: Gastroenterology;;   REMOVAL OF STONES  05/22/2022   Procedure: REMOVAL OF STONES;  Surgeon: Lemar Lofty., MD;  Location: [facility] ENDOSCOPY;  Service: Gastroenterology;;   Dennison Mascot N/A 10/10/2020   Procedure: Dennison Mascot;  Surgeon: Malissa Hippo, MD;  Location: [facility];  Service: Endoscopy;  Laterality: N/A;   SPHINCTEROTOMY  07/24/2021   Procedure: SPHINCTEROTOMY;  Surgeon: Mansouraty, Netty Starring., MD;  Location: [facility] ENDOSCOPY;  Service: Gastroenterology;;   Burman [place]estone CHOLANGIOSCOPY N/A 12/20/2020   Procedure: [id number] CHOLANGIOSCOPY;  Surgeon: Lemar Lofty., MD;  Location: [facility] ENDOSCOPY;  Service: Gastroenterology;  Laterality: N/A;   SPYGLASS CHOLANGIOSCOPY N/A 07/24/2021   Procedure: SPYGLASS CHOLANGIOSCOPY;  Surgeon: Lemar Lofty., MD;  Location: [facility] ENDOSCOPY;  Service: Gastroenterology;  Laterality: N/A;   STENT REMOVAL  11/09/2020   Procedure: STENT REMOVAL;  Surgeon: Lemar Lofty., MD;  Location: [facility] ENDOSCOPY;  Service: Gastroenterology;;   Francine Graven REMOVAL  11/11/2020   Procedure: STENT REMOVAL;  Surgeon: Lemar Lofty., MD;  Location: [facility] ENDOSCOPY;  Service: Gastroenterology;;   Francine Graven REMOVAL  11/14/2020   Procedure: STENT REMOVAL;  Surgeon: Lemar Lofty., MD;  Location: [facility] ENDOSCOPY;  Service: Gastroenterology;;   Francine Graven REMOVAL  12/20/2020   Procedure: STENT REMOVAL;  Surgeon: Lemar Lofty., MD;  Location: [facility] ENDOSCOPY;  Service: Gastroenterology;;  biliary x2   STENT REMOVAL  07/24/2021   Procedure: AXIOS STENT REMOVAL;  Surgeon: Lemar Lofty., MD;  Location: [facility] ENDOSCOPY;  Service: Gastroenterology;;   Francine Graven REMOVAL  07/30/2022   Procedure: STENT REMOVAL;  Surgeon: Lemar Lofty., MD;  Location: [facility]  ENDOSCOPY;  Service: Gastroenterology;;   UPPER ESOPHAGEAL ENDOSCOPIC ULTRASOUND (EUS) N/A 11/07/2020   Procedure: UPPER ESOPHAGEAL ENDOSCOPIC ULTRASOUND (EUS);  Surgeon: Lemar Lofty., MD;  Location: [facility] ENDOSCOPY;  Service: Gastroenterology;  Laterality: N/A;   UPPER GASTROINTESTINAL ENDOSCOPY     WOUND DEBRIDEMENT  11/09/2020   Procedure: CYST NECROSECTOMY;  Surgeon: Meridee Score Netty Starring., MD;  Location: [facility] ENDOSCOPY;  Service: Gastroenterology;;     A IV Location/Drains/Wounds Patient Lines/Drains/Airways Status     Active Line/Drains/Airways     Name Placement date Placement time Site Days   Peripheral IV 11/12/22 20 G 1" Anterior;Right Forearm 11/12/22  0844  Forearm  less than 1   Closed System Drain 1 Right;Lateral RLQ Bulb (JP) 10/09/20  0855  RLQ  764   GI Stent 10 Fr. 10/10/20  1540  --  763   GI Stent 7 Fr. 11/07/20  1456  --  735   GI Stent 11/07/20  1457  --  735   GI Stent 8.5 Fr. 11/09/20  1027  --  733   GI Stent 10 Fr. 11/14/20  1237  --  728   GI Stent  01/29/21  1311  --  652   GI Stent  06/26/21  0840  --  504   GI Stent 7 Fr. 06/26/21  0840  --  504   GI Stent 10 Fr. 07/24/21  1100  --  476   Incision - 4 Ports Umbilicus Medial;Superior Right;Lateral Right;Lateral;Distal 10/09/20  0751  -- 764            Intake/Output Last 24 hours  Intake/Output Summary (Last 24 hours) at 11/12/2022 1350 Last data filed at 11/12/2022 1030 Gross per 24 hour  Intake 999 ml  Output --  Net 999 ml    Labs/Imaging Results for orders placed or performed during the hospital encounter  of 11/12/22 (from the past 48 hour(s))  Comprehensive metabolic panel     Status: Abnormal   Collection Time: 11/12/22  8:48 AM  Result Value Ref Range   Sodium 132 (L) 135 - 145 mmol/L   Potassium 3.5 3.5 - 5.1 mmol/L   Chloride 97 (L) 98 - 111 mmol/L   CO2 21 (L) 22 - 32 mmol/L   Glucose, Bld 343 (H) 70 - 99 mg/dL    Comment: Glucose reference range applies only to samples  taken after fasting for at least 8 hours.   BUN 23 (H) 6 - 20 mg/dL   Creatinine, Ser 1.61 0.61 - 1.24 mg/dL   Calcium 8.8 (L) 8.9 - 10.3 mg/dL   Total Protein 7.3 6.5 - 8.1 g/dL   Albumin 4.3 3.5 - 5.0 g/dL   AST 21 15 - 41 U/L   ALT 42 0 - 44 U/L   Alkaline Phosphatase 235 (H) 38 - 126 U/L   Total Bilirubin 1.2 0.3 - 1.2 mg/dL   GFR, Estimated >09 >60 mL/min    Comment: (NOTE) Calculated using the CKD-EPI Creatinine Equation (2021)    Anion gap 14 5 - 15    Comment: Performed at [facility], [address] [place], [place] [address]  Lipase, blood     Status: Abnormal   Collection Time: 11/12/22  8:48 AM  Result Value Ref Range   Lipase 152 (H) 11 - 51 U/L    Comment: Performed at [facility], [address] [place], [place] [address]  CBC with Differential     Status: Abnormal   Collection Time: 11/12/22  8:48 AM  Result Value Ref Range   WBC 10.2 4.0 - 10.5 K/uL   RBC 6.09 (H) 4.22 - 5.81 MIL/uL   Hemoglobin 18.3 (H) 13.0 - 17.0 g/dL   HCT 47.8 29.5 - 62.1 %   MCV 84.2 80.0 - 100.0 fL   MCH 30.0 26.0 - 34.0 pg   MCHC 35.7 30.0 - 36.0 g/dL   RDW 30.8 65.7 - 84.6 %   Platelets 295 150 - 400 K/uL   nRBC 0.0 0.0 - 0.2 %   Neutrophils Relative % 74 %   Neutro Abs 7.6 1.7 - 7.7 K/uL   Lymphocytes Relative 17 %   Lymphs Abs 1.8 0.7 - 4.0 K/uL   Monocytes Relative 6 %   Monocytes Absolute 0.6 0.1 - 1.0 K/uL   Eosinophils Relative 1 %   Eosinophils Absolute 0.1 0.0 - 0.5 K/uL   Basophils Relative 1 %   Basophils Absolute 0.1 0.0 - 0.1 K/uL   Immature Granulocytes 1 %   Abs Immature Granulocytes 0.05 0.00 - 0.07 K/uL    Comment: Performed at [facility], [address] [place], [place] [address]  Troponin I (High Sensitivity)     Status: None   Collection Time: 11/12/22  8:48 AM  Result Value Ref Range   Troponin I (High Sensitivity) 3 <18 ng/L    Comment: (NOTE) Elevated high sensitivity troponin I (hsTnI)  values and significant  changes across serial measurements may suggest ACS but many other  chronic and acute conditions are known to elevate hsTnI results.  Refer to the "Links" section for chest pain algorithms and additional  guidance. Performed at [facility], [address], [place], [place] [address]   Troponin I (High Sensitivity)     Status: None   Collection Time: 11/12/22 11:00 AM  Result Value  Ref Range   Troponin I (High Sensitivity) 4 <18 ng/L    Comment: (NOTE) Elevated high sensitivity troponin I (hsTnI) values and significant  changes across serial measurements may suggest ACS but many other  chronic and acute conditions are known to elevate hsTnI results.  Refer to the "Links" section for chest pain algorithms and additional  guidance. Performed at [facility], [address], [place], [place] [address]   CBG monitoring, ED     Status: Abnormal   Collection Time: 11/12/22  1:46 PM  Result Value Ref Range   Glucose-Capillary 303 (H) 70 - 99 mg/dL    Comment: Glucose reference range applies only to samples taken after fasting for at least 8 hours.   CT ABDOMEN PELVIS W CONTRAST  Result Date: 11/12/2022 CLINICAL DATA:  Upper abdominal pain. EXAM: CT ABDOMEN AND PELVIS WITH CONTRAST TECHNIQUE: Multidetector CT imaging of the abdomen and pelvis was performed using the standard protocol following bolus administration of intravenous contrast. RADIATION DOSE REDUCTION: This exam was performed according to the departmental dose-optimization program which includes automated exposure control, adjustment of the mA and/or kV according to patient size and/or use of iterative reconstruction technique. CONTRAST:  OMNIPAQUE IOHEXOL 300 MG/ML  SOLN COMPARISON:  October 28, 2022. FINDINGS: Lower chest: No acute abnormality. Hepatobiliary: Status post cholecystectomy. Stable dilatation of common bile duct is noted which may be due to post  cholecystectomy status or potentially due to distal common bile duct obstruction secondary to pancreatic inflammation. Stable complex low density is seen involving the caudate lobe as well as lateral segmental left hepatic lobe in inferior portion of medial segment of left hepatic lobe. Pancreas: 6.3 x 4.8 cm fluid collection is noted between the distal stomach and pancreas which is significantly enlarged compared to prior exam and most consistent with pancreatic pseudocyst. Mild inflammatory changes are seen involving the pancreatic body suggesting pancreatitis. Mild pancreatic ductal dilatation is noted in the tail. Spleen: Normal in size without focal abnormality. Adrenals/Urinary Tract: Adrenal glands are unremarkable. Kidneys are normal, without renal calculi, focal lesion, or hydronephrosis. Bladder is unremarkable. Stomach/Bowel: Stomach is within normal limits. Appendix appears normal. No evidence of bowel wall thickening, distention, or inflammatory changes. Vascular/Lymphatic: No significant vascular findings are present. No enlarged abdominal or pelvic lymph nodes. Reproductive: Prostate is unremarkable. Other: No abdominal wall hernia or abnormality. No abdominopelvic ascites. Musculoskeletal: No acute or significant osseous findings. IMPRESSION: 6.3 x 4.8 cm fluid collection is noted between the distal stomach and pancreas which is significantly enlarged compared to prior exam and most consistent with enlarging pancreatic pseudocyst. Mild inflammatory changes are noted involving the pancreatic body suggesting pancreatitis. Mild pancreatic ductal dilatation is noted in the tail which was present on prior exam. There is continued dilatation of the common bile duct which may be due to post cholecystectomy status, but potentially may be due to obstruction of the distal common bile duct secondary to the pseudo cyst and pancreatic inflammation. Stable complex low density is seen involving the left hepatic  lobe and caudate lobe as noted on prior exams. It is uncertain if this represents focal inflammation or hepatic infarction as noted on prior exams. Electronically Signed   By: Lupita Raider M.D.   On: 11/12/2022 12:09    Pending Labs Unresulted Labs (From admission, onward)     Start     Ordered   11/19/22 0500  Creatinine, serum  (enoxaparin (LOVENOX)    CrCl >/= 30 ml/min)  Weekly,  R     Comments: while on enoxaparin therapy    11/12/22 1333   11/13/22 0500  Comprehensive metabolic panel  Tomorrow morning,   R        11/12/22 1333   11/13/22 0500  CBC  Tomorrow morning,   R        11/12/22 1333            Vitals/Pain Today's Vitals   11/12/22 0812 11/12/22 0923 11/12/22 1206 11/12/22 1300  BP: (!) 145/107  (!) 140/101 (!) 137/103  Pulse:   69 74  Resp:   (!) 22 18  Temp:   98 F (36.7 C)   TempSrc:   Oral   SpO2:   93% 92%  Weight: 94.3 kg     Height: 5\' 10"  (1.778 m)     PainSc: 8  5       Isolation Precautions No active isolations  Medications Medications  0.9 %  sodium chloride infusion (has no administration in time range)  insulin glargine (1 Unit Dial) (TOUJEO) Solostar Pen SOPN 5 Units (has no administration in time range)  insulin aspart (novoLOG) injection 0-15 Units (has no administration in time range)  enoxaparin (LOVENOX) injection 40 mg (has no administration in time range)  acetaminophen (TYLENOL) tablet 650 mg (has no administration in time range)    Or  acetaminophen (TYLENOL) suppository 650 mg (has no administration in time range)  HYDROmorphone (DILAUDID) injection 0.5-1 mg (has no administration in time range)  traZODone (DESYREL) tablet 25 mg (has no administration in time range)  docusate sodium (COLACE) capsule 100 mg (has no administration in time range)  polyethylene glycol (MIRALAX / GLYCOLAX) packet 17 g (has no administration in time range)  ondansetron (ZOFRAN) tablet 4 mg (has no administration in time range)    Or   ondansetron (ZOFRAN) injection 4 mg (has no administration in time range)  albuterol (PROVENTIL) (2.5 MG/3ML) 0.083% nebulizer solution 2.5 mg (has no administration in time range)  hydrALAZINE (APRESOLINE) injection 10 mg (has no administration in time range)  lactated ringers bolus 1,000 mL (0 mLs Intravenous Stopped 11/12/22 1030)  HYDROmorphone (DILAUDID) injection 1 mg (1 mg Intravenous Given 11/12/22 0856)  iohexol (OMNIPAQUE) 300 MG/ML solution 100 mL (100 mLs Intravenous Contrast Given 11/12/22 1121)    Mobility walks     Focused Assessments N/A   R Recommendations: See Admitting Provider Note  Report given to:   Additional Notes: n/a

## 2022-11-12 NOTE — ED Triage Notes (Signed)
Pt reports LUQ and RUQ pain that "never stops just got worse today" Hx Pancreatitis

## 2022-11-12 NOTE — ED Provider Notes (Signed)
[place] EMERGENCY DEPARTMENT AT [facility] Provider Note   CSN: [id number] Arrival date & time: 11/12/22  4098     History  Chief Complaint  Patient presents with   Abdominal Pain    Benet Kitchell is a 52 y.o. male.  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  Chart review shows the patient had a CT scan performed 2 weeks ago and gastroenterology is following.  Gastroenterology noted that the bile duct looks dilated secondary to chronic inflammation, they noted a nodularity in the liver, pancreas dilation with recurrent fluid collection, and chronic occlusion of the splenic vein.  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  Gastroenterology appears to be discussing more surgeries to help with his chronic conditions. Patient states that his pain became worse over the last week and is not controlled with his daily tramadol.  He reports 1 episode of vomiting and some diarrhea, however denies any other associated symptoms.  Patient states he follows with his physicians at [facility] long.   Abdominal Pain Associated symptoms: diarrhea        Home Medications Prior to Admission medications   Medication Sig Start Date End Date Taking? Authorizing Provider  Blood Glucose Monitoring Suppl DEVI 1 each by Does not apply route in the morning, at noon, and at bedtime. May substitute to any manufacturer covered by patient's insurance. E11.9 09/25/22   Raliegh Ip, DO  Continuous Blood Gluc Receiver (FREESTYLE LIBRE READER) DEVI 1 Units by Does not apply route daily. UAD to test BGs daily. Dx E11.9 09/25/22   Raliegh Ip, DO  Continuous Blood  Gluc Sensor (FREESTYLE LIBRE 3 SENSOR) MISC Place 1 sensor on the skin every 14 days. Use to check glucose continuously e11.9 09/25/22   Delynn Flavin M, DO  Glucose Blood (BLOOD GLUCOSE TEST STRIPS) STRP Test BGs morning and night. May substitute to any manufacturer covered by patient's insurance. E11.9 09/25/22   Delynn Flavin M, DO  insulin glargine, 1 Unit Dial, (TOUJEO SOLOSTAR) 300 UNIT/ML Solostar Pen Inject 10 Units into the skin at bedtime. 09/29/22   Raliegh Ip, DO  Insulin Pen Needle (BD PEN NEEDLE NANO U/F) 32G X 4 MM MISC UAD to administer insulin 09/25/22   Raliegh Ip, DO  Lancet Device MISC Test BGs morning and night. May substitute to any manufacturer covered by patient's insurance. E11.9 09/25/22   Raliegh Ip, DO  Lancets Misc. MISC Test BGs morning and night. E11.9 May substitute to any manufacturer covered by patient's insurance. 09/25/22   Raliegh Ip, DO  lipase/protease/amylase (CREON) 36000 UNITS CPEP capsule Take 2 capsules (72,000 Units total) by mouth 3 (three) times daily with meals. Patient taking differently: Take 36,000-72,000 Units by mouth See admin instructions. Take 2 capsules with each meal and 1 capsule with each snack 05/24/22   Shalhoub, Deno Lunger, MD  ondansetron (ZOFRAN-ODT) 4 MG disintegrating tablet Take 1 tablet (4 mg total) by mouth every 8 (eight) hours as needed for nausea or vomiting. 06/11/22   Dettinger, Elige Radon, MD      Allergies    Desipramine    Review of Systems   Review of Systems  Gastrointestinal:  Positive for abdominal pain and  diarrhea.  All other systems reviewed and are negative.   Physical Exam Updated Vital Signs BP (!) 140/101   Pulse 69   Temp 98 F (36.7 C) (Oral)   Resp (!) 22   Ht 5\' 10"  (1.778 m)   Wt 94.3 kg   SpO2 93%   BMI 29.84 kg/m  Physical Exam Vitals and nursing note reviewed.  Constitutional:      Appearance: He is well-developed.     Comments: Uncomfortable appearing   HENT:     Head: Normocephalic and atraumatic.  Eyes:     Conjunctiva/sclera: Conjunctivae normal.  Cardiovascular:     Rate and Rhythm: Normal rate and regular rhythm.     Heart sounds: No murmur heard. Pulmonary:     Effort: Pulmonary effort is normal. No respiratory distress.     Breath sounds: Normal breath sounds.  Abdominal:     General: Abdomen is flat. There is no distension.     Palpations: Abdomen is rigid.     Tenderness: There is abdominal tenderness in the right upper quadrant and left upper quadrant. There is no right CVA tenderness, left CVA tenderness, guarding or rebound.  Musculoskeletal:        General: No swelling.     Cervical back: Neck supple.  Skin:    General: Skin is warm and dry.     Capillary Refill: Capillary refill takes less than 2 seconds.  Neurological:     Mental Status: He is alert.  Psychiatric:        Mood and Affect: Mood normal.     ED Results / Procedures / Treatments   Labs (all labs ordered are listed, but only abnormal results are displayed) Labs Reviewed  COMPREHENSIVE METABOLIC PANEL - Abnormal; Notable for the following components:      Result Value   Sodium 132 (*)    Chloride 97 (*)    CO2 21 (*)    Glucose, Bld 343 (*)    BUN 23 (*)    Calcium 8.8 (*)    Alkaline Phosphatase 235 (*)    All other components within normal limits  LIPASE, BLOOD - Abnormal; Notable for the following components:   Lipase 152 (*)    All other components within normal limits  CBC WITH DIFFERENTIAL/PLATELET - Abnormal; Notable for the following components:   RBC 6.09 (*)    Hemoglobin 18.3 (*)    All other components within normal limits  TROPONIN I (HIGH SENSITIVITY)  TROPONIN I (HIGH SENSITIVITY)    EKG None  Radiology CT ABDOMEN PELVIS W CONTRAST  Result Date: 11/12/2022 CLINICAL DATA:  Upper abdominal pain. EXAM: CT ABDOMEN AND PELVIS WITH CONTRAST TECHNIQUE: Multidetector CT imaging of the abdomen and pelvis was performed using  the standard protocol following bolus administration of intravenous contrast. RADIATION DOSE REDUCTION: This exam was performed according to the departmental dose-optimization program which includes automated exposure control, adjustment of the mA and/or kV according to patient size and/or use of iterative reconstruction technique. CONTRAST:  OMNIPAQUE IOHEXOL 300 MG/ML  SOLN COMPARISON:  October 28, 2022. FINDINGS: Lower chest: No acute abnormality. Hepatobiliary: Status post cholecystectomy. Stable dilatation of common bile duct is noted which may be due to post cholecystectomy status or potentially due to distal common bile duct obstruction secondary to pancreatic inflammation. Stable complex low density is seen involving the caudate lobe as well as lateral segmental left hepatic lobe in inferior portion of medial segment of left hepatic lobe. Pancreas: 6.3 x 4.8  cm fluid collection is noted between the distal stomach and pancreas which is significantly enlarged compared to prior exam and most consistent with pancreatic pseudocyst. Mild inflammatory changes are seen involving the pancreatic body suggesting pancreatitis. Mild pancreatic ductal dilatation is noted in the tail. Spleen: Normal in size without focal abnormality. Adrenals/Urinary Tract: Adrenal glands are unremarkable. Kidneys are normal, without renal calculi, focal lesion, or hydronephrosis. Bladder is unremarkable. Stomach/Bowel: Stomach is within normal limits. Appendix appears normal. No evidence of bowel wall thickening, distention, or inflammatory changes. Vascular/Lymphatic: No significant vascular findings are present. No enlarged abdominal or pelvic lymph nodes. Reproductive: Prostate is unremarkable. Other: No abdominal wall hernia or abnormality. No abdominopelvic ascites. Musculoskeletal: No acute or significant osseous findings. IMPRESSION: 6.3 x 4.8 cm fluid collection is noted between the distal stomach and pancreas which is  significantly enlarged compared to prior exam and most consistent with enlarging pancreatic pseudocyst. Mild inflammatory changes are noted involving the pancreatic body suggesting pancreatitis. Mild pancreatic ductal dilatation is noted in the tail which was present on prior exam. There is continued dilatation of the common bile duct which may be due to post cholecystectomy status, but potentially may be due to obstruction of the distal common bile duct secondary to the pseudo cyst and pancreatic inflammation. Stable complex low density is seen involving the left hepatic lobe and caudate lobe as noted on prior exams. It is uncertain if this represents focal inflammation or hepatic infarction as noted on prior exams. Electronically Signed   By: Lupita Raider M.D.   On: 11/12/2022 12:09    Procedures Procedures    Medications Ordered in ED Medications  lactated ringers bolus 1,000 mL (0 mLs Intravenous Stopped 11/12/22 1030)  HYDROmorphone (DILAUDID) injection 1 mg (1 mg Intravenous Given 11/12/22 0856)  iohexol (OMNIPAQUE) 300 MG/ML solution 100 mL (100 mLs Intravenous Contrast Given 11/12/22 1121)    ED Course/ Medical Decision Making/ A&P                             Medical Decision Making 52 year old male presenting for evaluation of acute worsening of his chronic abdominal pain secondary to chronic pancreatitis.  Patient's vitals are otherwise stable but he appears uncomfortable in the examination room.  Differential includes acute on chronic pancreatitis, bowel perforation, bowel obstruction, hemorrhagic pancreatitis, and others.  IV fluids and pain control given.  Lab work and imaging ordered to rule out any acute process. Lab work shows elevated lipase, this is likely a chronic finding for him.  He also has elevated glucose and chronically elevated alkaline phosphatase.  CT of the abdomen/pelvis shows a larger fluid collection in the distal stomach and pancreas when compared to prior imaging.   This is likely a pancreatic pseudocyst.  We will admit the patient for further management of this finding and pain control.  Amount and/or Complexity of Data Reviewed Labs: ordered. Radiology: ordered.  Risk Prescription drug management. Decision regarding hospitalization.           Final Clinical Impression(s) / ED Diagnoses Final diagnoses:  Pancreatic pseudocyst    Rx / DC Orders ED Discharge Orders     None         Achsah Mcquade, DO 11/12/22 1220

## 2022-11-12 NOTE — H&P (Signed)
History and Physical  Steve Romero ZOX:[id number] DOB: 24-Aug-1970 DOA: 11/12/2022  PCP: Raliegh Ip, DO   Chief Complaint: Abdominal pain  HPI: Steve Romero is a 52 y.o. male with medical history significant for insulin-dependent type 2 diabetes, bipolar disorder, chronic pancreatitis with known pancreatic pseudocyst being admitted to the hospital with recurrent severe abdominal pain and concern for recurrent pancreatitis.  The patient has a complicated history of chronic pancreatitis and known pancreatic pseudocyst after complications from an elective laparoscopic cholecystectomy in April 2022.  This was followed by severe necrotizing pancreatitis after ERCP.  He subsequently developed a biliary stricture for which he had a metal CBD stent and balloon sphincteroplasty in November 2023, and recurrent pseudocyst in the body of his pancreas, which has been drained endoscopically multiple times with gastroenterology.  Eventually his metal biliary stent was removed and he most recently only had a small residual fluid collection anterior to the neck/body of the pancreas.  He has continued to have chronic abdominal pain, was seen in consultation by Dr. Sophronia Simas of hepatobiliary surgery, who recommended continued observation and the patient last saw him in March of this year.  The definitive treatment would be surgical resection.  In any case, the patient does have chronic abdominal pain, but returns to the hospital this morning due to severe abdominal pain with associated nausea for the last few days.  Denies any fevers, chills or any other symptoms.  Says that his abdominal pain is somewhat reminiscent of his prior episodes of pancreatitis.  Denies any pleuritic chest pain, or shortness of breath.  ED Course: Evaluation in the ER reveals hypertension but otherwise stable vital signs.  Lab work was performed, remarkable for borderline hyponatremia, elevated blood glucose 343, negative troponins,  unremarkable CBC, lipase 152, alk phos 235, LFTs otherwise unremarkable.  CT scan as noted below shows evidence of significantly large pancreatic pseudocyst up to 6.3 cm in size.  Hospitalist was contacted for admission, and gastroenterology has been consulted by the ER at this writer's request.  Review of Systems: Please see HPI for pertinent positives and negatives. A complete 10 system review of systems are otherwise negative.  Past Medical History:  Diagnosis Date   Acute pancreatitis without necrosis or infection, unspecified    AKI (acute kidney injury) (HCC) 05/17/2022   Anxiety 10/2014   Ascites    Bipolar disorder [facility]) age 35   COPD (chronic obstructive pulmonary disease) (HCC) 2016   Depression age 48   Enlarged prostate    Hyperlipidemia 2016   Hypertension 2013   Nausea vomiting and diarrhea 06/15/2022   Schizoaffective disorder (HCC) 11/13/2014   Sleep apnea    Does not wear c-pap, sleeps in sitting up position per pt   Thyroid disease 11/2014   Past Surgical History:  Procedure Laterality Date   BALLOON DILATION N/A 11/07/2020   Procedure: BALLOON DILATION;  Surgeon: Lemar Lofty., MD;  Location: [facility] ENDOSCOPY;  Service: Gastroenterology;  Laterality: N/A;   BALLOON DILATION N/A 06/26/2021   Procedure: BALLOON DILATION;  Surgeon: Meridee Score Netty Starring., MD;  Location: [facility] ENDOSCOPY;  Service: Gastroenterology;  Laterality: N/A;   BILIARY DILATION  07/24/2021   Procedure: BILIARY DILATION;  Surgeon: Meridee Score Netty Starring., MD;  Location: [facility] ENDOSCOPY;  Service: Gastroenterology;;   BILIARY DILATION  05/22/2022   Procedure: BILIARY DILATION;  Surgeon: Lemar Lofty., MD;  Location: [facility] ENDOSCOPY;  Service: Gastroenterology;;   BILIARY DILATION  06/16/2022   Procedure: GASTROSTOMY DILATION;  Surgeon: Lemar Lofty.,  MD;  Location: [facility] ENDOSCOPY;  Service: Gastroenterology;;   BILIARY STENT PLACEMENT N/A 10/10/2020   Procedure: BILIARY STENT  PLACEMENT;  Surgeon: Malissa Hippo, MD;  Location: [facility] ORS;  Service: Endoscopy;  Laterality: N/A;   BILIARY STENT PLACEMENT  11/09/2020   Procedure: BILIARY STENT PLACEMENT;  Surgeon: Meridee Score Netty Starring., MD;  Location: [facility] ENDOSCOPY;  Service: Gastroenterology;;   BILIARY STENT PLACEMENT  11/07/2020   Procedure: BILIARY STENT PLACEMENT;  Surgeon: Lemar Lofty., MD;  Location: [facility] ENDOSCOPY;  Service: Gastroenterology;;   BILIARY STENT PLACEMENT  11/11/2020   Procedure: BILIARY STENT PLACEMENT;  Surgeon: Lemar Lofty., MD;  Location: [facility] ENDOSCOPY;  Service: Gastroenterology;;   BILIARY STENT PLACEMENT  11/14/2020   Procedure: BILIARY STENT PLACEMENT;  Surgeon: Lemar Lofty., MD;  Location: [facility] ENDOSCOPY;  Service: Gastroenterology;;   BILIARY STENT PLACEMENT N/A 01/29/2021   Procedure: BILIARY STENT PLACEMENT;  Surgeon: Lemar Lofty., MD;  Location: [facility] ENDOSCOPY;  Service: Gastroenterology;  Laterality: N/A;   BILIARY STENT PLACEMENT  06/26/2021   Procedure: BILIARY STENT PLACEMENT;  Surgeon: Meridee Score Netty Starring., MD;  Location: [facility] ENDOSCOPY;  Service: Gastroenterology;;   BILIARY STENT PLACEMENT  07/24/2021   Procedure: BILIARY STENT PLACEMENT;  Surgeon: Lemar Lofty., MD;  Location: [facility] ENDOSCOPY;  Service: Gastroenterology;;   BIOPSY  11/07/2020   Procedure: BIOPSY;  Surgeon: Lemar Lofty., MD;  Location: [facility] ENDOSCOPY;  Service: Gastroenterology;;   BIOPSY  12/20/2020   Procedure: BIOPSY;  Surgeon: Lemar Lofty., MD;  Location: [facility] ENDOSCOPY;  Service: Gastroenterology;;   BIOPSY  06/26/2021   Procedure: BIOPSY;  Surgeon: Lemar Lofty., MD;  Location: [facility] ENDOSCOPY;  Service: Gastroenterology;;   CARPAL TUNNEL RELEASE Left 02/21/2016   Procedure: CARPAL TUNNEL RELEASE;  Surgeon: Vickki Hearing, MD;  Location: [facility];  Service: Orthopedics;  Laterality: Left;   CHOLECYSTECTOMY N/A 10/09/2020   Procedure:  LAPAROSCOPIC CHOLECYSTECTOMY;  Surgeon: Lucretia Roers, MD;  Location: [facility];  Service: General;  Laterality: N/A;   CYST ENTEROSTOMY N/A 11/07/2020   Procedure: CYST ENTEROSTOMY;  Surgeon: Lemar Lofty., MD;  Location: [facility] ENDOSCOPY;  Service: Gastroenterology;  Laterality: N/A;   CYST GASTROSTOMY  11/11/2020   Procedure: CYST NECROSECTOMY;  Surgeon: Meridee Score Netty Starring., MD;  Location: [facility] ENDOSCOPY;  Service: Gastroenterology;;   CYST GASTROSTOMY  11/14/2020   Procedure: CYST NECROSECTOMY;  Surgeon: Lemar Lofty., MD;  Location: [facility] ENDOSCOPY;  Service: Gastroenterology;;   CYST GASTROSTOMY  06/16/2022   Procedure: CYST GASTROSTOMY;  Surgeon: Lemar Lofty., MD;  Location: [facility] ENDOSCOPY;  Service: Gastroenterology;;   CYST REMOVAL HAND     CYSTOSCOPY  06/26/2021   Procedure: CYSTOGASTROSTOMY;  Surgeon: Mansouraty, Netty Starring., MD;  Location: [facility] ENDOSCOPY;  Service: Gastroenterology;;   ENDOSCOPIC RETROGRADE CHOLANGIOPANCREATOGRAPHY (ERCP) WITH PROPOFOL N/A 11/09/2020   Procedure: ENDOSCOPIC RETROGRADE CHOLANGIOPANCREATOGRAPHY (ERCP) WITH PROPOFOL;  Surgeon: Lemar Lofty., MD;  Location: [facility] ENDOSCOPY;  Service: Gastroenterology;  Laterality: N/A;   ENDOSCOPIC RETROGRADE CHOLANGIOPANCREATOGRAPHY (ERCP) WITH PROPOFOL N/A 12/20/2020   Procedure: ENDOSCOPIC RETROGRADE CHOLANGIOPANCREATOGRAPHY (ERCP) WITH PROPOFOL;  Surgeon: Meridee Score Netty Starring., MD;  Location: [facility] ENDOSCOPY;  Service: Gastroenterology;  Laterality: N/A;   ENDOSCOPIC RETROGRADE CHOLANGIOPANCREATOGRAPHY (ERCP) WITH PROPOFOL N/A 01/29/2021   Procedure: ENDOSCOPIC RETROGRADE CHOLANGIOPANCREATOGRAPHY (ERCP) WITH PROPOFOL;  Surgeon: Meridee Score Netty Starring., MD;  Location: [facility] ENDOSCOPY;  Service: Gastroenterology;  Laterality: N/A;   ENDOSCOPIC RETROGRADE CHOLANGIOPANCREATOGRAPHY (ERCP) WITH PROPOFOL N/A 07/24/2021   Procedure: ENDOSCOPIC RETROGRADE CHOLANGIOPANCREATOGRAPHY (ERCP) WITH PROPOFOL;   Surgeon:  Mansouraty, Netty Starring., MD;  Location: [facility] ENDOSCOPY;  Service: Gastroenterology;  Laterality: N/A;   ERCP N/A 10/10/2020   Procedure: ENDOSCOPIC RETROGRADE CHOLANGIOPANCREATOGRAPHY (ERCP);  Surgeon: Malissa Hippo, MD;  Location: [facility];  Service: Endoscopy;  Laterality: N/A;   ERCP     ERCP N/A 05/22/2022   Procedure: ENDOSCOPIC RETROGRADE CHOLANGIOPANCREATOGRAPHY (ERCP);  Surgeon: Lemar Lofty., MD;  Location: [facility] ENDOSCOPY;  Service: Gastroenterology;  Laterality: N/A;   ESOPHAGOGASTRODUODENOSCOPY N/A 11/11/2020   Procedure: ESOPHAGOGASTRODUODENOSCOPY (EGD);  Surgeon: Lemar Lofty., MD;  Location: [facility] ENDOSCOPY;  Service: Gastroenterology;  Laterality: N/A;   ESOPHAGOGASTRODUODENOSCOPY N/A 11/14/2020   Procedure: ESOPHAGOGASTRODUODENOSCOPY (EGD);  Surgeon: Lemar Lofty., MD;  Location: [facility] ENDOSCOPY;  Service: Gastroenterology;  Laterality: N/A;   ESOPHAGOGASTRODUODENOSCOPY (EGD) WITH PROPOFOL N/A 11/09/2020   Procedure: ESOPHAGOGASTRODUODENOSCOPY (EGD) WITH PROPOFOL;  Surgeon: Meridee Score Netty Starring., MD;  Location: [facility] ENDOSCOPY;  Service: Gastroenterology;  Laterality: N/A;   ESOPHAGOGASTRODUODENOSCOPY (EGD) WITH PROPOFOL N/A 11/07/2020   Procedure: ESOPHAGOGASTRODUODENOSCOPY (EGD) WITH PROPOFOL;  Surgeon: Meridee Score Netty Starring., MD;  Location: [facility] ENDOSCOPY;  Service: Gastroenterology;  Laterality: N/A;   ESOPHAGOGASTRODUODENOSCOPY (EGD) WITH PROPOFOL N/A 12/20/2020   Procedure: ESOPHAGOGASTRODUODENOSCOPY (EGD) WITH PROPOFOL;  Surgeon: Meridee Score Netty Starring., MD;  Location: [facility] ENDOSCOPY;  Service: Gastroenterology;  Laterality: N/A;   ESOPHAGOGASTRODUODENOSCOPY (EGD) WITH PROPOFOL N/A 06/26/2021   Procedure: ESOPHAGOGASTRODUODENOSCOPY (EGD) WITH PROPOFOL;  Surgeon: Meridee Score Netty Starring., MD;  Location: [facility] ENDOSCOPY;  Service: Gastroenterology;  Laterality: N/A;   ESOPHAGOGASTRODUODENOSCOPY (EGD) WITH PROPOFOL N/A 07/24/2021   Procedure:  ESOPHAGOGASTRODUODENOSCOPY (EGD) WITH PROPOFOL;  Surgeon: Meridee Score Netty Starring., MD;  Location: [facility] ENDOSCOPY;  Service: Gastroenterology;  Laterality: N/A;  AXIOS STENT   ESOPHAGOGASTRODUODENOSCOPY (EGD) WITH PROPOFOL N/A 05/22/2022   Procedure: ESOPHAGOGASTRODUODENOSCOPY (EGD) WITH PROPOFOL;  Surgeon: Meridee Score Netty Starring., MD;  Location: [facility] ENDOSCOPY;  Service: Gastroenterology;  Laterality: N/A;   ESOPHAGOGASTRODUODENOSCOPY (EGD) WITH PROPOFOL N/A 06/16/2022   Procedure: ESOPHAGOGASTRODUODENOSCOPY (EGD) WITH PROPOFOL;  Surgeon: Meridee Score Netty Starring., MD;  Location: [facility] ENDOSCOPY;  Service: Gastroenterology;  Laterality: N/A;   ESOPHAGOGASTRODUODENOSCOPY (EGD) WITH PROPOFOL N/A 07/30/2022   Procedure: ESOPHAGOGASTRODUODENOSCOPY (EGD) WITH PROPOFOL;  Surgeon: Meridee Score Netty Starring., MD;  Location: [facility] ENDOSCOPY;  Service: Gastroenterology;  Laterality: N/A;   EUS  11/14/2020   Procedure: UPPER ENDOSCOPIC ULTRASOUND (EUS) LINEAR;  Surgeon: Lemar Lofty., MD;  Location: [facility] ENDOSCOPY;  Service: Gastroenterology;;   EUS N/A 06/26/2021   Procedure: UPPER ENDOSCOPIC ULTRASOUND (EUS) RADIAL;  Surgeon: Lemar Lofty., MD;  Location: [facility] ENDOSCOPY;  Service: Gastroenterology;  Laterality: N/A;   EUS N/A 05/22/2022   Procedure: UPPER ENDOSCOPIC ULTRASOUND (EUS) LINEAR;  Surgeon: Lemar Lofty., MD;  Location: [facility] ENDOSCOPY;  Service: Gastroenterology;  Laterality: N/A;   EUS N/A 06/16/2022   Procedure: UPPER ENDOSCOPIC ULTRASOUND (EUS) LINEAR;  Surgeon: Lemar Lofty., MD;  Location: [facility] ENDOSCOPY;  Service: Gastroenterology;  Laterality: N/A;   FINE NEEDLE ASPIRATION  05/22/2022   Procedure: FINE NEEDLE ASPIRATION;  Surgeon: Meridee Score Netty Starring., MD;  Location: [facility] ENDOSCOPY;  Service: Gastroenterology;;   GASTROINTESTINAL STENT REMOVAL  12/20/2020   Procedure: GASTROINTESTINAL STENT REMOVAL;  Surgeon: Lemar Lofty., MD;  Location: [facility] ENDOSCOPY;  Service:  Gastroenterology;;  cyst gastrostomy stent and double pig tail stents x2 removed   HERNIA REPAIR Left 2002   groin   INGUINAL HERNIA REPAIR Left 04/05/2017   Procedure: RECURRENT HERNIA REPAIR INGUINAL ADULT WITH MESH;  Surgeon: Franky Macho, MD;  Location: [facility];  Service: General;  Laterality: Left;   MASS  EXCISION Right 04/29/2020   Procedure: EXCISION MASS RIGHT WRIST;  Surgeon: Betha Loa, MD;  Location: [facility];  Service: Orthopedics;  Laterality: Right;   MASS EXCISION Right 10/09/2020   Procedure: EXCISION MASS, ABDOMINAL WALL, 2CM;  Surgeon: Lucretia Roers, MD;  Location: [facility];  Service: General;  Laterality: Right;   PANCREATIC STENT PLACEMENT  06/26/2021   Procedure: AXIOS STENT PLACEMENT;  Surgeon: Lemar Lofty., MD;  Location: [facility] ENDOSCOPY;  Service: Gastroenterology;;   PANCREATIC STENT PLACEMENT  06/16/2022   Procedure: PANCREATIC STENT PLACEMENT;  Surgeon: Lemar Lofty., MD;  Location: [facility] ENDOSCOPY;  Service: Gastroenterology;;   REMOVAL OF STONES  12/20/2020   Procedure: REMOVAL OF STONES;  Surgeon: Lemar Lofty., MD;  Location: [facility] ENDOSCOPY;  Service: Gastroenterology;;   REMOVAL OF STONES  07/24/2021   Procedure: REMOVAL OF STONES;  Surgeon: Lemar Lofty., MD;  Location: [facility] ENDOSCOPY;  Service: Gastroenterology;;   REMOVAL OF STONES  05/22/2022   Procedure: REMOVAL OF STONES;  Surgeon: Lemar Lofty., MD;  Location: [facility] ENDOSCOPY;  Service: Gastroenterology;;   Dennison Mascot N/A 10/10/2020   Procedure: Dennison Mascot;  Surgeon: Malissa Hippo, MD;  Location: [facility];  Service: Endoscopy;  Laterality: N/A;   SPHINCTEROTOMY  07/24/2021   Procedure: SPHINCTEROTOMY;  Surgeon: Mansouraty, Netty Starring., MD;  Location: [facility] ENDOSCOPY;  Service: Gastroenterology;;   Burman Freestone CHOLANGIOSCOPY N/A 12/20/2020   Procedure: ZOXWRUEA CHOLANGIOSCOPY;  Surgeon: Lemar Lofty., MD;  Location: [facility] ENDOSCOPY;   Service: Gastroenterology;  Laterality: N/A;   SPYGLASS CHOLANGIOSCOPY N/A 07/24/2021   Procedure: SPYGLASS CHOLANGIOSCOPY;  Surgeon: Lemar Lofty., MD;  Location: [facility] ENDOSCOPY;  Service: Gastroenterology;  Laterality: N/A;   STENT REMOVAL  11/09/2020   Procedure: STENT REMOVAL;  Surgeon: Lemar Lofty., MD;  Location: [facility] ENDOSCOPY;  Service: Gastroenterology;;   Francine Graven REMOVAL  11/11/2020   Procedure: STENT REMOVAL;  Surgeon: Lemar Lofty., MD;  Location: [facility] ENDOSCOPY;  Service: Gastroenterology;;   Francine Graven REMOVAL  11/14/2020   Procedure: STENT REMOVAL;  Surgeon: Lemar Lofty., MD;  Location: [facility] ENDOSCOPY;  Service: Gastroenterology;;   Francine Graven REMOVAL  12/20/2020   Procedure: STENT REMOVAL;  Surgeon: Lemar Lofty., MD;  Location: [facility] ENDOSCOPY;  Service: Gastroenterology;;  biliary x2   STENT REMOVAL  07/24/2021   Procedure: AXIOS STENT REMOVAL;  Surgeon: Lemar Lofty., MD;  Location: [facility] ENDOSCOPY;  Service: Gastroenterology;;   Francine Graven REMOVAL  07/30/2022   Procedure: STENT REMOVAL;  Surgeon: Lemar Lofty., MD;  Location: [facility] ENDOSCOPY;  Service: Gastroenterology;;   UPPER ESOPHAGEAL ENDOSCOPIC ULTRASOUND (EUS) N/A 11/07/2020   Procedure: UPPER ESOPHAGEAL ENDOSCOPIC ULTRASOUND (EUS);  Surgeon: Lemar Lofty., MD;  Location: [facility] ENDOSCOPY;  Service: Gastroenterology;  Laterality: N/A;   UPPER GASTROINTESTINAL ENDOSCOPY     WOUND DEBRIDEMENT  11/09/2020   Procedure: CYST NECROSECTOMY;  Surgeon: Meridee Score Netty Starring., MD;  Location: [facility] ENDOSCOPY;  Service: Gastroenterology;;    Social History:  reports that he has been smoking cigarettes. He has a 6.50 pack-year smoking history. He has never used smokeless tobacco. He reports that he does not drink alcohol and does not use drugs.   Allergies  Allergen Reactions   Desipramine Nausea Only and Rash    Family History  Problem Relation Age of Onset   Diabetes Father     Heart disease Father    Heart disease Maternal Grandmother    Stroke Maternal Grandfather    Colon cancer Neg Hx    Esophageal cancer  Neg Hx    Inflammatory bowel disease Neg Hx    Liver disease Neg Hx    Pancreatic cancer Neg Hx    Rectal cancer Neg Hx    Stomach cancer Neg Hx      Prior to Admission medications   Medication Sig Start Date End Date Taking? Authorizing Provider  Continuous Blood Gluc Receiver (FREESTYLE LIBRE READER) DEVI 1 Units by Does not apply route daily. UAD to test BGs daily. Dx E11.9 09/25/22   Raliegh Ip, DO  Continuous Blood Gluc Sensor (FREESTYLE LIBRE 3 SENSOR) MISC Place 1 sensor on the skin every 14 days. Use to check glucose continuously e11.9 09/25/22   Delynn Flavin M, DO  insulin glargine, 1 Unit Dial, (TOUJEO SOLOSTAR) 300 UNIT/ML Solostar Pen Inject 10 Units into the skin at bedtime. 09/29/22   Raliegh Ip, DO  Insulin Pen Needle (BD PEN NEEDLE NANO U/F) 32G X 4 MM MISC UAD to administer insulin 09/25/22   Delynn Flavin M, DO  lipase/protease/amylase (CREON) 36000 UNITS CPEP capsule Take 2 capsules (72,000 Units total) by mouth 3 (three) times daily with meals. Patient taking differently: Take 36,000-72,000 Units by mouth See admin instructions. Take 2 capsules with each meal and 1 capsule with each snack 05/24/22   Shalhoub, Deno Lunger, MD  ondansetron (ZOFRAN-ODT) 4 MG disintegrating tablet Take 1 tablet (4 mg total) by mouth every 8 (eight) hours as needed for nausea or vomiting. 06/11/22   Dettinger, Elige Radon, MD    Physical Exam: BP (!) 137/103   Pulse 74   Temp 98 F (36.7 C) (Oral)   Resp 18   Ht 5\' 10"  (1.778 m)   Wt 94.3 kg   SpO2 92%   BMI 29.84 kg/m   General:  Alert, oriented, calm, nontoxic in appearance, looks slightly uncomfortable but not acutely so Eyes: EOMI, clear conjuctivae, white sclerea Neck: supple, no masses, trachea mildline  Cardiovascular: RRR, no murmurs or rubs, no peripheral edema   Respiratory: clear to auscultation bilaterally, no wheezes, no crackles  Abdomen: soft, diffusely tender especially in the left upper quadrant, nondistended, normal bowel tones heard  Skin: dry, no rashes  Musculoskeletal: no joint effusions, normal range of motion  Psychiatric: appropriate affect, normal speech  Neurologic: extraocular muscles intact, clear speech, moving all extremities with intact sensorium          Labs on Admission:  Basic Metabolic Panel: Recent Labs  Lab 11/12/22 0848  NA 132*  K 3.5  CL 97*  CO2 21*  GLUCOSE 343*  BUN 23*  CREATININE 1.11  CALCIUM 8.8*   Liver Function Tests: Recent Labs  Lab 11/12/22 0848  AST 21  ALT 42  ALKPHOS 235*  BILITOT 1.2  PROT 7.3  ALBUMIN 4.3   Recent Labs  Lab 11/12/22 0848  LIPASE 152*   No results for input(s): "AMMONIA" in the last 168 hours. CBC: Recent Labs  Lab 11/12/22 0848  WBC 10.2  NEUTROABS 7.6  HGB 18.3*  HCT 51.3  MCV 84.2  PLT 295   Cardiac Enzymes: No results for input(s): "CKTOTAL", "CKMB", "CKMBINDEX", "TROPONINI" in the last 168 hours.  BNP (last 3 results) No results for input(s): "BNP" in the last 8760 hours.  ProBNP (last 3 results) No results for input(s): "PROBNP" in the last 8760 hours.  CBG: No results for input(s): "GLUCAP" in the last 168 hours.  Radiological Exams on Admission: CT ABDOMEN PELVIS W CONTRAST  Result Date: 11/12/2022 CLINICAL DATA:  Upper  abdominal pain. EXAM: CT ABDOMEN AND PELVIS WITH CONTRAST TECHNIQUE: Multidetector CT imaging of the abdomen and pelvis was performed using the standard protocol following bolus administration of intravenous contrast. RADIATION DOSE REDUCTION: This exam was performed according to the departmental dose-optimization program which includes automated exposure control, adjustment of the mA and/or kV according to patient size and/or use of iterative reconstruction technique. CONTRAST:  OMNIPAQUE IOHEXOL 300 MG/ML  SOLN  COMPARISON:  October 28, 2022. FINDINGS: Lower chest: No acute abnormality. Hepatobiliary: Status post cholecystectomy. Stable dilatation of common bile duct is noted which may be due to post cholecystectomy status or potentially due to distal common bile duct obstruction secondary to pancreatic inflammation. Stable complex low density is seen involving the caudate lobe as well as lateral segmental left hepatic lobe in inferior portion of medial segment of left hepatic lobe. Pancreas: 6.3 x 4.8 cm fluid collection is noted between the distal stomach and pancreas which is significantly enlarged compared to prior exam and most consistent with pancreatic pseudocyst. Mild inflammatory changes are seen involving the pancreatic body suggesting pancreatitis. Mild pancreatic ductal dilatation is noted in the tail. Spleen: Normal in size without focal abnormality. Adrenals/Urinary Tract: Adrenal glands are unremarkable. Kidneys are normal, without renal calculi, focal lesion, or hydronephrosis. Bladder is unremarkable. Stomach/Bowel: Stomach is within normal limits. Appendix appears normal. No evidence of bowel wall thickening, distention, or inflammatory changes. Vascular/Lymphatic: No significant vascular findings are present. No enlarged abdominal or pelvic lymph nodes. Reproductive: Prostate is unremarkable. Other: No abdominal wall hernia or abnormality. No abdominopelvic ascites. Musculoskeletal: No acute or significant osseous findings. IMPRESSION: 6.3 x 4.8 cm fluid collection is noted between the distal stomach and pancreas which is significantly enlarged compared to prior exam and most consistent with enlarging pancreatic pseudocyst. Mild inflammatory changes are noted involving the pancreatic body suggesting pancreatitis. Mild pancreatic ductal dilatation is noted in the tail which was present on prior exam. There is continued dilatation of the common bile duct which may be due to post cholecystectomy status, but  potentially may be due to obstruction of the distal common bile duct secondary to the pseudo cyst and pancreatic inflammation. Stable complex low density is seen involving the left hepatic lobe and caudate lobe as noted on prior exams. It is uncertain if this represents focal inflammation or hepatic infarction as noted on prior exams. Electronically Signed   By: Lupita Raider M.D.   On: 11/12/2022 12:09    Assessment/Plan This is a pleasant and unfortunate 52 year old gentleman who has dealt with chronic pancreatitis, pancreatic pseudocyst since having an elective cholecystectomy in April 2022.  He has been followed by gastroenterology and hepatobiliary surgery, now returns to the hospital with recurrent abdominal pain with enlarging pancreatic pseudocyst and pancreatitis. -Inpatient admission -N.p.o. -IV fluids -Pain and nausea control as needed -Inpatient gastroenterology consultation, anticipate he may benefit from hepatobiliary consultation as well; there has been discussion about surgical resection as definitive treatment  COPD (chronic obstructive pulmonary disease) (HCC)  Type 2 diabetes-will place on half dose basal insulin, with sliding scale.  Plan on diabetic diet once he can be advanced.  Chronic home medications will be ordered as appropriate once reconciled by pharmacy staff.  DVT prophylaxis: Lovenox     Code Status: Full Code  Consults called: None  Admission status: The appropriate patient status for this patient is INPATIENT. Inpatient status is judged to be reasonable and necessary in order to provide the required intensity of service to ensure the patient's  safety. The patient's presenting symptoms, physical exam findings, and initial radiographic and laboratory data in the context of their chronic comorbidities is felt to place them at high risk for further clinical deterioration. Furthermore, it is not anticipated that the patient will be medically stable for discharge  from the hospital within 2 midnights of admission.    I certify that at the point of admission it is my clinical judgment that the patient will require inpatient hospital care spanning beyond 2 midnights from the point of admission due to high intensity of service, high risk for further deterioration and high frequency of surveillance required   Time spent: 61 minutes  Lien Lyman Sharlette Dense MD [facility] Pager [phone]  If 7PM-7AM, please contact [email or web address] [email or web address] Password [email or web address]  11/12/2022, 1:34 PM

## 2022-11-12 NOTE — Consult Note (Addendum)
Consultation Note   Referring Provider:  [facility] PCP: Raliegh Ip, DO Primary Gastroenterologist: Corliss Parish, MD Reason for consultation: enlarging pseudocyst / abdominal pain   DOA: 11/12/2022        Hospital Day: 1  Brief Narrative:  Patient is a 52 y.o. year old male whose past medical history includes but is not necessarily limited to bipolar disorder, schizoaffective disorder, hypertension, hyperlipidemia, OSA, complicated necrotizing pancreatitis (secondary to gallstone/post-ERCP pancreatitis with eventual smoldering complications of biliary obstruction and recurrent pseudocysts), chronic SVT, chronic abdominal discomfort, colon polyps (TAs).   Assessment and Plan   Recurrent and enlarging pancreatic pseudocyst following removal of AXIOS stent in late January.  Admitted now with worsening abdominal pain, nausea and vomiting.  -Supportive care -We consulted Hepatobiliary Surgery who has has already been following him outpatient.   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  Liver chemistries are at baseline without worsening of cholestasis.   Possible cirrhosis by CT scan  Further workup in outpatient setting.    Attending Physician Note   I have taken a history, reviewed the chart and examined the patient. I performed a substantive portion of this encounter, including complete performance of at least one of the key components, in conjunction with the APP. I agree with the APP's note, impression and recommendations with my edits. My additional impressions and recommendations are as follows.   Recurrent, enlarging pancreatic pseudocyst with increased abdominal pain, nausea, vomiting. Dr. Meridee Score performed an Axios cystgastrostomy in mid Dec 2023 and the [facility] stent was removed in late Jan 2024. He has chronic pancreatitis, exocrine pancreatic insufficiency, pancreatic duct disruption,  biliary stricture, biliary dilation and a chronic SVT.  CT in April 2024 showed an irregular hepatic contour and an enlarged caudate lobe.  Supportive care. Surgical consult, pt known to Dr. Sophronia Simas. Review mgmt with Dr. Meridee Score.   Claudette Head, MD [facility] See Loretha Stapler, [facility], for our on call provider     History of Present Illness Steve Romero was last seen in the office 07/21/22 for follow up. He was s/p ASIOS stent for cystgastrostomy creation for recurrent pancreatitis. He was doing well. Nausea, vomiting and abdominal pain were better. He underwent removal of the stent on 07/30/22.  Follow up CT scan 09/28/22 showed a residual fluid collection measuring 1.5 x 0.8 cm.  He had a repeat CT scan 4/27 showing a recurrent fluid collection between the body of pancreas and posterior aspect of the distal stomach measures 2.8 x 2.0 cm.  His is followed by Dr. Sophronia Simas  (Hepatobiliary Surgery). At the time of his last visit with her on 3/26 his chronic nausea / vomiting and abdominal pain were at baseline but over the last couple of weeks he has had worsening of his chronic symptoms. He has almost constant LUQ pain radiating around his side to left back. Pain sometimes worse with food. No other GI complaints. He came to the ED today and CT scan shows marked enlarged of the fluid collection . He has biliary duct dilation and know biliary stricture but liver chemistries are stable. WBC stable.    Imaging and Labs Recent Labs    11/12/22 0848  WBC 10.2  HGB 18.3*  HCT 51.3  PLT 295   Recent Labs    11/12/22 0848  NA 132*  K 3.5  CL 97*  CO2 21*  GLUCOSE 343*  BUN 23*  CREATININE 1.11  CALCIUM 8.8*   Recent Labs    11/12/22 0848  PROT 7.3  ALBUMIN 4.3  AST 21  ALT 42  ALKPHOS 235*  BILITOT 1.2   No results for input(s): "HEPBSAG", "HCVAB", "HEPAIGM", "HEPBIGM" in the last 72 hours. No results for input(s): "LABPROT", "INR" in the last 72 hours.  09/25/22 CT with and  without contrast IMPRESSION: 1. Interval removal of cystogastrostomy stent with increased atrophy and hypoenhancement of the pancreatic neck/body with similar heterogeneous enhancement of the pancreatic uncinate process, head, and tail. A small residual collection along the prior stent measures 1.5 x 0.8 cm. 2. Irregular hypoenhancement of the central left hepatic lobe and medial caudate lobe, new from 07/13/2022. Findings are nonspecific and could represent perfusional/inflammatory change versus developing hepatic infarct. No discrete fluid collection. The hepatic vasculature appears patent. 3. Similar mild intrahepatic and extrahepatic bile duct dilation to the level of the pancreatic head.   10/27/21 CT liver / abd with and without contrast MPRESSION: 1. Sequelae of chronic pancreatitis is again identified with hypoenhancement and atrophy involving the neck and body of pancreas as before. 2. Recurrent fluid collection (pseudo cyst) is identified between the body of pancreas and posterior aspect of the distal stomach measures 2.8 x 2.0 cm. On the previous exam this measured 1.5 x 0.8 cm. 3. New dilatation of the main pancreatic duct at the level of the tail of pancreas which measures up to 5 mm on today's study. 4. Similar appearance of moderate intrahepatic and extrahepatic bile duct dilatation. Suspect postinflammatory CBD stricture at the level of the head of pancreas. 5. 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 6. Morphologic features of the liver suggestive of cirrhosis. 7. Chronic bilateral L3 pars defects with L3-4 degenerative disc disease.    11/12/22 CT with contrast IMPRESSION: 6.3 x 4.8 cm fluid collection is noted between the distal stomach and pancreas which is significantly enlarged compared to prior exam and most consistent with enlarging pancreatic pseudocyst. Mild inflammatory changes are noted involving the pancreatic  body suggesting pancreatitis. Mild pancreatic ductal dilatation is noted in the tail which was present on prior exam. There is continued dilatation of the common bile duct which may be due to post cholecystectomy status, but potentially may be due to obstruction of the distal common bile duct secondary to the pseudo cyst and pancreatic inflammation.   Stable complex low density is seen involving the left hepatic lobe and caudate lobe as noted on prior exams. It is uncertain if this represents focal inflammation or hepatic infarction as noted on prior exams.    Previous GI Evaluation:   **Most recent endoscopic procedure  EGD 07/30/22 for AXIOS stent removal - No gross lesions in the entire esophagus. - Z-line irregular, 41 cm from the incisors. - Pre-existing cystgastrostomy stents were found and removed. - Pseudocyst cavity entered and shows healthy viable tissue present. - Non-bleeding gastric ulcer with a clean ulcer base (Forrest Class III) from the previous stents. - No gross lesions in the duodenal bulb, in the first portion of the duodenum and in the second portion of the duodenum.   Principal Problem:   Pancreatic pseudocyst Active Problems:   Acute on chronic pancreatitis (HCC)   COPD (chronic obstructive pulmonary disease) (HCC)   Abnormal LFTs  Past Medical History:  Diagnosis Date   Acute pancreatitis without necrosis or infection, unspecified    AKI (acute kidney injury) (HCC) 05/17/2022   Anxiety 10/2014   Ascites    Bipolar disorder [facility]) age 62   COPD (chronic obstructive pulmonary disease) (HCC) 2016   Depression age 19   Enlarged prostate    Hyperlipidemia 2016   Hypertension 2013   Nausea vomiting and diarrhea 06/15/2022   Schizoaffective disorder (HCC) 11/13/2014   Sleep apnea    Does not wear c-pap, sleeps in sitting up position per pt   Thyroid disease 11/2014    Past Surgical History:  Procedure Laterality Date   BALLOON DILATION N/A  11/07/2020   Procedure: BALLOON DILATION;  Surgeon: Lemar Lofty., MD;  Location: [facility] ENDOSCOPY;  Service: Gastroenterology;  Laterality: N/A;   BALLOON DILATION N/A 06/26/2021   Procedure: BALLOON DILATION;  Surgeon: Meridee Score Netty Starring., MD;  Location: [facility] ENDOSCOPY;  Service: Gastroenterology;  Laterality: N/A;   BILIARY DILATION  07/24/2021   Procedure: BILIARY DILATION;  Surgeon: Meridee Score Netty Starring., MD;  Location: [facility] ENDOSCOPY;  Service: Gastroenterology;;   BILIARY DILATION  05/22/2022   Procedure: BILIARY DILATION;  Surgeon: Lemar Lofty., MD;  Location: [facility] ENDOSCOPY;  Service: Gastroenterology;;   BILIARY DILATION  06/16/2022   Procedure: GASTROSTOMY DILATION;  Surgeon: Lemar Lofty., MD;  Location: [facility] ENDOSCOPY;  Service: Gastroenterology;;   BILIARY STENT PLACEMENT N/A 10/10/2020   Procedure: BILIARY STENT PLACEMENT;  Surgeon: Malissa Hippo, MD;  Location: [facility];  Service: Endoscopy;  Laterality: N/A;   BILIARY STENT PLACEMENT  11/09/2020   Procedure: BILIARY STENT PLACEMENT;  Surgeon: Meridee Score Netty Starring., MD;  Location: [facility] ENDOSCOPY;  Service: Gastroenterology;;   BILIARY STENT PLACEMENT  11/07/2020   Procedure: BILIARY STENT PLACEMENT;  Surgeon: Lemar Lofty., MD;  Location: [facility] ENDOSCOPY;  Service: Gastroenterology;;   BILIARY STENT PLACEMENT  11/11/2020   Procedure: BILIARY STENT PLACEMENT;  Surgeon: Lemar Lofty., MD;  Location: [facility] ENDOSCOPY;  Service: Gastroenterology;;   BILIARY STENT PLACEMENT  11/14/2020   Procedure: BILIARY STENT PLACEMENT;  Surgeon: Lemar Lofty., MD;  Location: [facility] ENDOSCOPY;  Service: Gastroenterology;;   BILIARY STENT PLACEMENT N/A 01/29/2021   Procedure: BILIARY STENT PLACEMENT;  Surgeon: Lemar Lofty., MD;  Location: [facility] ENDOSCOPY;  Service: Gastroenterology;  Laterality: N/A;   BILIARY STENT PLACEMENT  06/26/2021   Procedure: BILIARY STENT PLACEMENT;  Surgeon: Meridee Score  Netty Starring., MD;  Location: [facility] ENDOSCOPY;  Service: Gastroenterology;;   BILIARY STENT PLACEMENT  07/24/2021   Procedure: BILIARY STENT PLACEMENT;  Surgeon: Lemar Lofty., MD;  Location: [facility] ENDOSCOPY;  Service: Gastroenterology;;   BIOPSY  11/07/2020   Procedure: BIOPSY;  Surgeon: Lemar Lofty., MD;  Location: [facility] ENDOSCOPY;  Service: Gastroenterology;;   BIOPSY  12/20/2020   Procedure: BIOPSY;  Surgeon: Lemar Lofty., MD;  Location: [facility] ENDOSCOPY;  Service: Gastroenterology;;   BIOPSY  06/26/2021   Procedure: BIOPSY;  Surgeon: Lemar Lofty., MD;  Location: [facility] ENDOSCOPY;  Service: Gastroenterology;;   CARPAL TUNNEL RELEASE Left 02/21/2016   Procedure: CARPAL TUNNEL RELEASE;  Surgeon: Vickki Hearing, MD;  Location: [facility];  Service: Orthopedics;  Laterality: Left;   CHOLECYSTECTOMY N/A 10/09/2020   Procedure: LAPAROSCOPIC CHOLECYSTECTOMY;  Surgeon: Lucretia Roers, MD;  Location: [facility];  Service: General;  Laterality: N/A;   CYST ENTEROSTOMY N/A 11/07/2020   Procedure: CYST ENTEROSTOMY;  Surgeon: Lemar Lofty., MD;  Location: [facility] ENDOSCOPY;  Service: Gastroenterology;  Laterality: N/A;   CYST GASTROSTOMY  11/11/2020   Procedure: CYST NECROSECTOMY;  Surgeon: Meridee Score Netty Starring., MD;  Location: [facility] ENDOSCOPY;  Service: Gastroenterology;;   CYST GASTROSTOMY  11/14/2020   Procedure: CYST NECROSECTOMY;  Surgeon: Lemar Lofty., MD;  Location: [facility] ENDOSCOPY;  Service: Gastroenterology;;   CYST GASTROSTOMY  06/16/2022   Procedure: CYST GASTROSTOMY;  Surgeon: Lemar Lofty., MD;  Location: [facility] ENDOSCOPY;  Service: Gastroenterology;;   CYST REMOVAL HAND     CYSTOSCOPY  06/26/2021   Procedure: CYSTOGASTROSTOMY;  Surgeon: Mansouraty, Netty Starring., MD;  Location: [facility] ENDOSCOPY;  Service: Gastroenterology;;   ENDOSCOPIC RETROGRADE CHOLANGIOPANCREATOGRAPHY (ERCP) WITH PROPOFOL N/A 11/09/2020   Procedure: ENDOSCOPIC RETROGRADE  CHOLANGIOPANCREATOGRAPHY (ERCP) WITH PROPOFOL;  Surgeon: Lemar Lofty., MD;  Location: [facility] ENDOSCOPY;  Service: Gastroenterology;  Laterality: N/A;   ENDOSCOPIC RETROGRADE CHOLANGIOPANCREATOGRAPHY (ERCP) WITH PROPOFOL N/A 12/20/2020   Procedure: ENDOSCOPIC RETROGRADE CHOLANGIOPANCREATOGRAPHY (ERCP) WITH PROPOFOL;  Surgeon: Meridee Score Netty Starring., MD;  Location: [facility] ENDOSCOPY;  Service: Gastroenterology;  Laterality: N/A;   ENDOSCOPIC RETROGRADE CHOLANGIOPANCREATOGRAPHY (ERCP) WITH PROPOFOL N/A 01/29/2021   Procedure: ENDOSCOPIC RETROGRADE CHOLANGIOPANCREATOGRAPHY (ERCP) WITH PROPOFOL;  Surgeon: Meridee Score Netty Starring., MD;  Location: [facility] ENDOSCOPY;  Service: Gastroenterology;  Laterality: N/A;   ENDOSCOPIC RETROGRADE CHOLANGIOPANCREATOGRAPHY (ERCP) WITH PROPOFOL N/A 07/24/2021   Procedure: ENDOSCOPIC RETROGRADE CHOLANGIOPANCREATOGRAPHY (ERCP) WITH PROPOFOL;  Surgeon: Meridee Score Netty Starring., MD;  Location: [facility] ENDOSCOPY;  Service: Gastroenterology;  Laterality: N/A;   ERCP N/A 10/10/2020   Procedure: ENDOSCOPIC RETROGRADE CHOLANGIOPANCREATOGRAPHY (ERCP);  Surgeon: Malissa Hippo, MD;  Location: [facility];  Service: Endoscopy;  Laterality: N/A;   ERCP     ERCP N/A 05/22/2022   Procedure: ENDOSCOPIC RETROGRADE CHOLANGIOPANCREATOGRAPHY (ERCP);  Surgeon: Lemar Lofty., MD;  Location: [facility] ENDOSCOPY;  Service: Gastroenterology;  Laterality: N/A;   ESOPHAGOGASTRODUODENOSCOPY N/A 11/11/2020   Procedure: ESOPHAGOGASTRODUODENOSCOPY (EGD);  Surgeon: Lemar Lofty., MD;  Location: [facility] ENDOSCOPY;  Service: Gastroenterology;  Laterality: N/A;   ESOPHAGOGASTRODUODENOSCOPY N/A 11/14/2020   Procedure: ESOPHAGOGASTRODUODENOSCOPY (EGD);  Surgeon: Lemar Lofty., MD;  Location: [facility] ENDOSCOPY;  Service: Gastroenterology;  Laterality: N/A;   ESOPHAGOGASTRODUODENOSCOPY (EGD) WITH PROPOFOL N/A 11/09/2020   Procedure: ESOPHAGOGASTRODUODENOSCOPY (EGD) WITH PROPOFOL;  Surgeon: Meridee Score Netty Starring., MD;  Location: [facility] ENDOSCOPY;  Service: Gastroenterology;  Laterality: N/A;   ESOPHAGOGASTRODUODENOSCOPY (EGD) WITH PROPOFOL N/A 11/07/2020   Procedure: ESOPHAGOGASTRODUODENOSCOPY (EGD) WITH PROPOFOL;  Surgeon: Meridee Score Netty Starring., MD;  Location: [facility] ENDOSCOPY;  Service: Gastroenterology;  Laterality: N/A;   ESOPHAGOGASTRODUODENOSCOPY (EGD) WITH PROPOFOL N/A 12/20/2020   Procedure: ESOPHAGOGASTRODUODENOSCOPY (EGD) WITH PROPOFOL;  Surgeon: Meridee Score Netty Starring., MD;  Location: [facility] ENDOSCOPY;  Service: Gastroenterology;  Laterality: N/A;   ESOPHAGOGASTRODUODENOSCOPY (EGD) WITH PROPOFOL N/A 06/26/2021   Procedure: ESOPHAGOGASTRODUODENOSCOPY (EGD) WITH PROPOFOL;  Surgeon: Meridee Score Netty Starring., MD;  Location: [facility] ENDOSCOPY;  Service: Gastroenterology;  Laterality: N/A;   ESOPHAGOGASTRODUODENOSCOPY (EGD) WITH PROPOFOL N/A 07/24/2021   Procedure: ESOPHAGOGASTRODUODENOSCOPY (EGD) WITH PROPOFOL;  Surgeon: Meridee Score Netty Starring., MD;  Location: [facility] ENDOSCOPY;  Service: Gastroenterology;  Laterality: N/A;  AXIOS STENT   ESOPHAGOGASTRODUODENOSCOPY (EGD) WITH PROPOFOL N/A 05/22/2022   Procedure: ESOPHAGOGASTRODUODENOSCOPY (EGD) WITH PROPOFOL;  Surgeon: Meridee Score Netty Starring., MD;  Location: [facility] ENDOSCOPY;  Service: Gastroenterology;  Laterality: N/A;   ESOPHAGOGASTRODUODENOSCOPY (EGD) WITH PROPOFOL N/A 06/16/2022   Procedure: ESOPHAGOGASTRODUODENOSCOPY (EGD) WITH PROPOFOL;  Surgeon: Meridee Score Netty Starring., MD;  Location: [facility] ENDOSCOPY;  Service: Gastroenterology;  Laterality: N/A;   ESOPHAGOGASTRODUODENOSCOPY (EGD) WITH PROPOFOL N/A 07/30/2022   Procedure: ESOPHAGOGASTRODUODENOSCOPY (EGD) WITH PROPOFOL;  Surgeon: Lemar Lofty., MD;  Location: [facility] ENDOSCOPY;  Service: Gastroenterology;  Laterality: N/A;   EUS  11/14/2020   Procedure: UPPER ENDOSCOPIC ULTRASOUND (EUS) LINEAR;  Surgeon: Lemar Lofty., MD;  Location: [facility] ENDOSCOPY;  Service: Gastroenterology;;   EUS N/A 06/26/2021    Procedure: UPPER ENDOSCOPIC ULTRASOUND (EUS) RADIAL;  Surgeon: Lemar Lofty., MD;  Location: [facility] ENDOSCOPY;  Service: Gastroenterology;  Laterality: N/A;   EUS N/A 05/22/2022   Procedure: UPPER ENDOSCOPIC ULTRASOUND (EUS) LINEAR;  Surgeon: Lemar Lofty., MD;  Location: [facility] ENDOSCOPY;  Service: Gastroenterology;  Laterality: N/A;   EUS N/A 06/16/2022   Procedure: UPPER ENDOSCOPIC ULTRASOUND (EUS) LINEAR;  Surgeon: Lemar Lofty., MD;  Location: [facility] ENDOSCOPY;  Service: Gastroenterology;  Laterality: N/A;   FINE NEEDLE ASPIRATION  05/22/2022   Procedure: FINE NEEDLE ASPIRATION;  Surgeon: Meridee Score Netty Starring., MD;  Location: [facility] ENDOSCOPY;  Service: Gastroenterology;;   GASTROINTESTINAL STENT REMOVAL  12/20/2020   Procedure: GASTROINTESTINAL STENT REMOVAL;  Surgeon: Lemar Lofty., MD;  Location: [facility] ENDOSCOPY;  Service: Gastroenterology;;  cyst gastrostomy stent and double pig tail stents x2 removed   HERNIA REPAIR Left 2002   groin   INGUINAL HERNIA REPAIR Left 04/05/2017   Procedure: RECURRENT HERNIA REPAIR INGUINAL ADULT WITH MESH;  Surgeon: Franky Macho, MD;  Location: [facility];  Service: General;  Laterality: Left;   MASS EXCISION Right 04/29/2020   Procedure: EXCISION MASS RIGHT WRIST;  Surgeon: Betha Loa, MD;  Location: [facility];  Service: Orthopedics;  Laterality: Right;   MASS EXCISION Right 10/09/2020   Procedure: EXCISION MASS, ABDOMINAL WALL, 2CM;  Surgeon: Lucretia Roers, MD;  Location: [facility];  Service: General;  Laterality: Right;   PANCREATIC STENT PLACEMENT  06/26/2021   Procedure: AXIOS STENT PLACEMENT;  Surgeon: Lemar Lofty., MD;  Location: [facility] ENDOSCOPY;  Service: Gastroenterology;;   PANCREATIC STENT PLACEMENT  06/16/2022   Procedure: PANCREATIC STENT PLACEMENT;  Surgeon: Lemar Lofty., MD;  Location: [facility] ENDOSCOPY;  Service: Gastroenterology;;   REMOVAL OF STONES  12/20/2020   Procedure: REMOVAL OF  STONES;  Surgeon: Lemar Lofty., MD;  Location: [facility] ENDOSCOPY;  Service: Gastroenterology;;   REMOVAL OF STONES  07/24/2021   Procedure: REMOVAL OF STONES;  Surgeon: Lemar Lofty., MD;  Location: [facility] ENDOSCOPY;  Service: Gastroenterology;;   REMOVAL OF STONES  05/22/2022   Procedure: REMOVAL OF STONES;  Surgeon: Lemar Lofty., MD;  Location: [facility] ENDOSCOPY;  Service: Gastroenterology;;   Dennison Mascot N/A 10/10/2020   Procedure: Dennison Mascot;  Surgeon: Malissa Hippo, MD;  Location: [facility];  Service: Endoscopy;  Laterality: N/A;   SPHINCTEROTOMY  07/24/2021   Procedure: SPHINCTEROTOMY;  Surgeon: Mansouraty, Netty Starring., MD;  Location: [facility] ENDOSCOPY;  Service: Gastroenterology;;   Burman Freestone CHOLANGIOSCOPY N/A 12/20/2020   Procedure: ZOXWRUEA CHOLANGIOSCOPY;  Surgeon: Lemar Lofty., MD;  Location: [facility] ENDOSCOPY;  Service: Gastroenterology;  Laterality: N/A;   SPYGLASS CHOLANGIOSCOPY N/A 07/24/2021   Procedure: SPYGLASS CHOLANGIOSCOPY;  Surgeon: Lemar Lofty., MD;  Location: [facility] ENDOSCOPY;  Service: Gastroenterology;  Laterality: N/A;   STENT REMOVAL  11/09/2020   Procedure: STENT REMOVAL;  Surgeon: Lemar Lofty., MD;  Location: [facility] ENDOSCOPY;  Service: Gastroenterology;;   Francine Graven REMOVAL  11/11/2020   Procedure: STENT REMOVAL;  Surgeon: Lemar Lofty., MD;  Location: [facility] ENDOSCOPY;  Service: Gastroenterology;;   Francine Graven REMOVAL  11/14/2020   Procedure: STENT REMOVAL;  Surgeon: Lemar Lofty., MD;  Location: [facility] ENDOSCOPY;  Service: Gastroenterology;;   STENT REMOVAL  12/20/2020   Procedure:  STENT REMOVAL;  Surgeon: Meridee Score Netty Starring., MD;  Location: [facility] ENDOSCOPY;  Service: Gastroenterology;;  biliary x2   STENT REMOVAL  07/24/2021   Procedure: Christy Sartorius REMOVAL;  Surgeon: Lemar Lofty., MD;  Location: [facility] ENDOSCOPY;  Service: Gastroenterology;;   Francine Graven REMOVAL  07/30/2022   Procedure: STENT REMOVAL;  Surgeon:  Lemar Lofty., MD;  Location: [facility] ENDOSCOPY;  Service: Gastroenterology;;   UPPER ESOPHAGEAL ENDOSCOPIC ULTRASOUND (EUS) N/A 11/07/2020   Procedure: UPPER ESOPHAGEAL ENDOSCOPIC ULTRASOUND (EUS);  Surgeon: Lemar Lofty., MD;  Location: [facility] ENDOSCOPY;  Service: Gastroenterology;  Laterality: N/A;   UPPER GASTROINTESTINAL ENDOSCOPY     WOUND DEBRIDEMENT  11/09/2020   Procedure: CYST NECROSECTOMY;  Surgeon: Meridee Score Netty Starring., MD;  Location: [facility] ENDOSCOPY;  Service: Gastroenterology;;    Family History  Problem Relation Age of Onset   Diabetes Father    Heart disease Father    Heart disease Maternal Grandmother    Stroke Maternal Grandfather    Colon cancer Neg Hx    Esophageal cancer Neg Hx    Inflammatory bowel disease Neg Hx    Liver disease Neg Hx    Pancreatic cancer Neg Hx    Rectal cancer Neg Hx    Stomach cancer Neg Hx     Prior to Admission medications   Medication Sig Start Date End Date Taking? Authorizing Provider  insulin glargine, 1 Unit Dial, (TOUJEO SOLOSTAR) 300 UNIT/ML Solostar Pen Inject 10 Units into the skin at bedtime. Patient taking differently: Inject 5 Units into the skin in the morning and at bedtime. 09/29/22  Yes Delynn Flavin M, DO  lipase/protease/amylase (CREON) 36000 UNITS CPEP capsule Take 2 capsules (72,000 Units total) by mouth 3 (three) times daily with meals. Patient taking differently: Take 72,000-108,000 Units by mouth in the morning and at bedtime. 05/24/22  Yes Shalhoub, Deno Lunger, MD  traMADol (ULTRAM) 50 MG tablet Take 50 mg by mouth as needed for moderate pain or severe pain.   Yes [provider]  Continuous Blood Gluc Receiver (FREESTYLE LIBRE READER) DEVI 1 Units by Does not apply route daily. UAD to test BGs daily. Dx E11.9 09/25/22   Raliegh Ip, DO  Continuous Blood Gluc Sensor (FREESTYLE LIBRE 3 SENSOR) MISC Place 1 sensor on the skin every 14 days. Use to check glucose continuously e11.9 09/25/22    Delynn Flavin M, DO  Insulin Pen Needle (BD PEN NEEDLE NANO U/F) 32G X 4 MM MISC UAD to administer insulin 09/25/22   Delynn Flavin M, DO  ondansetron (ZOFRAN-ODT) 4 MG disintegrating tablet Take 1 tablet (4 mg total) by mouth every 8 (eight) hours as needed for nausea or vomiting. Patient not taking: Reported on 11/12/2022 06/11/22   Dettinger, Elige Radon, MD    Current Facility-Administered Medications  Medication Dose Route Frequency Provider Last Rate Last Admin   0.9 %  sodium chloride infusion   Intravenous Continuous Kirby Crigler, Mir M, MD       acetaminophen (TYLENOL) tablet 650 mg  650 mg Oral Q6H PRN Kirby Crigler, Mir M, MD       Or   acetaminophen (TYLENOL) suppository 650 mg  650 mg Rectal Q6H PRN Kirby Crigler, Mir M, MD       albuterol (PROVENTIL) (2.5 MG/3ML) 0.083% nebulizer solution 2.5 mg  2.5 mg Nebulization Q2H PRN Kirby Crigler, Mir M, MD       docusate sodium (COLACE) capsule 100 mg  100 mg Oral BID Kirby Crigler, Mir M, MD   100 mg at 11/12/22  1408   enoxaparin (LOVENOX) injection 40 mg  40 mg Subcutaneous QHS Kirby Crigler, Mir M, MD       hydrALAZINE (APRESOLINE) injection 10 mg  10 mg Intravenous Q6H PRN Kirby Crigler, Mir M, MD       HYDROmorphone (DILAUDID) injection 0.5-1 mg  0.5-1 mg Intravenous Q2H PRN Kirby Crigler, Mir M, MD   1 mg at 11/12/22 1444   insulin aspart (novoLOG) injection 0-15 Units  0-15 Units Subcutaneous Q4H Kirby Crigler, Mir M, MD   11 Units at 11/12/22 1408   insulin glargine (1 Unit Dial) (TOUJEO) Solostar Pen SOPN 5 Units  5 Units Subcutaneous QHS Kirby Crigler, Mir M, MD       ondansetron [facility]) tablet 4 mg  4 mg Oral Q6H PRN Kirby Crigler, Mir M, MD       Or   ondansetron [facility]) injection 4 mg  4 mg Intravenous Q6H PRN Kirby Crigler, Mir M, MD       polyethylene glycol (MIRALAX / GLYCOLAX) packet 17 g  17 g Oral Daily PRN Kirby Crigler, Mir M, MD       traZODone (DESYREL) tablet 25 mg  25 mg Oral QHS PRN Maryln Gottron, MD        Allergies as of 11/12/2022 -  Review Complete 11/12/2022  Allergen Reaction Noted   Desipramine Nausea Only and Rash 08/24/2012    Social History   Socioeconomic History   Marital status: Single    Spouse name: Not on file   Number of children: 0   Years of education: Not on file   Highest education level: Not on file  Occupational History   Not on file  Tobacco Use   Smoking status: Every Day    Packs/day: 0.25    Years: 26.00    Additional pack years: 0.00    Total pack years: 6.50    Types: Cigarettes   Smokeless tobacco: Never   Tobacco comments:    4-5 cig daily as of 10/07/2020.  Vaping Use   Vaping Use: Never used  Substance and Sexual Activity   Alcohol use: No   Drug use: No   Sexual activity: Never    Birth control/protection: None  Other Topics Concern   Not on file  Social History Narrative   Not on file   Social Determinants of Health   Financial Resource Strain: Not on file  Food Insecurity: No Food Insecurity (06/13/2022)   Hunger Vital Sign    Worried About Running Out of Food in the Last Year: Never true    Ran Out of Food in the Last Year: Never true  Transportation Needs: No Transportation Needs (06/13/2022)   PRAPARE - Administrator, Civil Service (Medical): No    Lack of Transportation (Non-Medical): No  Physical Activity: Not on file  Stress: Not on file  Social Connections: Not on file  Intimate Partner Violence: Not At Risk (06/13/2022)   Humiliation, Afraid, Rape, and Kick questionnaire    Fear of Current or Ex-Partner: No    Emotionally Abused: No    Physically Abused: No    Sexually Abused: No     Code Status   Code Status: Full Code  Review of Systems: All systems reviewed and negative except where noted in HPI.  Physical Exam: Vital signs in last 24 hours: Temp:  [97.7 F (36.5 C)-98.3 F (36.8 C)] 97.7 F (36.5 C) (05/09 1532) Pulse Rate:  [63-91] 63 (05/09 1532) Resp:  [16-23] 16 (05/09 1532) BP: (137-145)/(96-107) 140/100 (05/09  1532) SpO2:  [91 %-96 %] 96 % (05/09 1532) Weight:  [94.3 kg] 94.3 kg (05/09 4098)    General:  Pleasant male in NAD Psych:  Cooperative. Normal mood and affect Eyes: Pupils equal Ears:  Normal auditory acuity Nose: No deformity, discharge or lesions Neck:  Supple, no masses felt Lungs:  Clear to auscultation.  Heart:  Regular rate, regular rhythm.  Abdomen:  Soft, mildly distended, moderate LUQ tenderness,  active bowel sounds Rectal :  Deferred Msk: Symmetrical without gross deformities.  Neurologic:  Alert, oriented, grossly normal neurologically Extremities : No edema Skin:  Intact without significant lesions. Multiple tatoos   Intake/Output from previous day: No intake/output data recorded. Intake/Output this shift:  Total I/O In: 999 [IV Piggyback:999] Out: -      Willette Cluster, NP-C @  11/12/2022, 3:43 PM

## 2022-11-13 ENCOUNTER — Inpatient Hospital Stay (HOSPITAL_COMMUNITY): Payer: Medicaid Other

## 2022-11-13 DIAGNOSIS — K831 Obstruction of bile duct: Secondary | ICD-10-CM | POA: Diagnosis not present

## 2022-11-13 DIAGNOSIS — E663 Overweight: Secondary | ICD-10-CM | POA: Diagnosis not present

## 2022-11-13 DIAGNOSIS — R7989 Other specified abnormal findings of blood chemistry: Secondary | ICD-10-CM | POA: Diagnosis not present

## 2022-11-13 DIAGNOSIS — K8689 Other specified diseases of pancreas: Secondary | ICD-10-CM | POA: Diagnosis not present

## 2022-11-13 DIAGNOSIS — K859 Acute pancreatitis without necrosis or infection, unspecified: Secondary | ICD-10-CM | POA: Diagnosis not present

## 2022-11-13 DIAGNOSIS — K863 Pseudocyst of pancreas: Secondary | ICD-10-CM | POA: Diagnosis not present

## 2022-11-13 LAB — CBC
HCT: 44.1 % (ref 39.0–52.0)
Hemoglobin: 15.6 g/dL (ref 13.0–17.0)
MCH: 30.5 pg (ref 26.0–34.0)
MCHC: 35.4 g/dL (ref 30.0–36.0)
MCV: 86.3 fL (ref 80.0–100.0)
Platelets: 246 10*3/uL (ref 150–400)
RBC: 5.11 MIL/uL (ref 4.22–5.81)
RDW: 12.1 % (ref 11.5–15.5)
WBC: 8.9 10*3/uL (ref 4.0–10.5)
nRBC: 0 % (ref 0.0–0.2)

## 2022-11-13 LAB — PROTIME-INR
INR: 0.9 (ref 0.8–1.2)
Prothrombin Time: 12.7 seconds (ref 11.4–15.2)

## 2022-11-13 LAB — COMPREHENSIVE METABOLIC PANEL
ALT: 28 U/L (ref 0–44)
AST: 14 U/L — ABNORMAL LOW (ref 15–41)
Albumin: 3.5 g/dL (ref 3.5–5.0)
Alkaline Phosphatase: 168 U/L — ABNORMAL HIGH (ref 38–126)
Anion gap: 8 (ref 5–15)
BUN: 17 mg/dL (ref 6–20)
CO2: 29 mmol/L (ref 22–32)
Calcium: 8.3 mg/dL — ABNORMAL LOW (ref 8.9–10.3)
Chloride: 98 mmol/L (ref 98–111)
Creatinine, Ser: 0.87 mg/dL (ref 0.61–1.24)
GFR, Estimated: >60 mL/min (ref >60)
Glucose, Bld: 182 mg/dL — ABNORMAL HIGH (ref 70–99)
Potassium: 4 mmol/L (ref 3.5–5.1)
Sodium: 135 mmol/L (ref 135–145)
Total Bilirubin: 0.9 mg/dL (ref 0.3–1.2)
Total Protein: 6.1 g/dL — ABNORMAL LOW (ref 6.5–8.1)

## 2022-11-13 LAB — GLUCOSE, CAPILLARY
Glucose-Capillary: 152 mg/dL — ABNORMAL HIGH (ref 70–99)
Glucose-Capillary: 166 mg/dL — ABNORMAL HIGH (ref 70–99)
Glucose-Capillary: 182 mg/dL — ABNORMAL HIGH (ref 70–99)
Glucose-Capillary: 184 mg/dL — ABNORMAL HIGH (ref 70–99)
Glucose-Capillary: 98 mg/dL (ref 70–99)

## 2022-11-13 NOTE — Progress Notes (Signed)
Initial Nutrition Assessment  INTERVENTION:   Monitor magnesium, potassium, and phosphorus for at least 3 days, MD to replete as needed, as pt is at risk for refeeding syndrome.  If NJT tube (below Ligament of Trietz) is placed -Initiate Osmolite 1.2 @20  ml/hr, advance by 10 ml every 12 hours to goal rate of 75 ml/hr. -60 ml Prosource TF 20 daily -Provides 2240 kcals, 119g protein and 1476 ml H2O  If patient does not tolerate feeds, can switch to semi-elemental formula, Vital 1.5 @ goal rate of 60 ml/hr.  -If NJT cannot be placed, consider TPN in the meantime to provide nutrition support. Recommend attempt feeding tube at later time to maintain gut integrity and other benefits.  -Recommend 100 mg Thiamine x 5 days once nutrition support is initiated -Multivitamin with minerals daily -B complex w/ Vitamin C daily   NUTRITION DIAGNOSIS:   Inadequate oral intake related to nausea, vomiting, chronic illness as evidenced by per patient/family report.  GOAL:   Patient will meet greater than or equal to 90% of their needs  MONITOR:   PO intake, Labs, Weight trends, I & O's  REASON FOR ASSESSMENT:   Consult Assessment of nutrition requirement/status  ASSESSMENT:   51 y.o. male with medical history significant for insulin-dependent type 2 diabetes, bipolar disorder, chronic pancreatitis with known pancreatic pseudocyst being admitted to the hospital with recurrent severe abdominal pain and concern for recurrent pancreatitis.  The patient has a complicated history of chronic pancreatitis and known pancreatic pseudocyst after complications from an elective laparoscopic cholecystectomy in April 2022.  This was followed by severe necrotizing pancreatitis after ERCP.  He subsequently developed a biliary stricture for which he had a metal CBD stent and balloon sphincteroplasty in November 2023, and recurrent pseudocyst in the body of his pancreas, which has been drained endoscopically  multiple times with gastroenterology.  Patient in room, uncomfortable and reports he is in pain. Reports he has not been able to tolerate anything for several days. Last tolerated some chicken a few days ago. Reports continued N/V, vomited this morning. States he does have good days where he can eat but pain comes and goes which inhibits him from eating well. He states he has been taking Creon. Has not been taking any vitamins.   Surgery consulted for recommendations for nutrition support. Considering NJT feeds vs TPN. Would recommend NJT feeds be tried first to maintain gut integrity and other benefits. Messaged Surgery, MD and RN recommendations. If placement is not timely then can start TPN until successful placement can be achieved. Will leave tube feeding recommendations above.   Pt reports his weight has remained between 200-210 lbs.  Medications: Colace, Insulin, zofran  Labs reviewed: CBGs: 149-303    NUTRITION - FOCUSED PHYSICAL EXAM:  No depletions noted.  Diet Order:   Diet Order             Diet heart healthy/carb modified Room service appropriate? Yes; Fluid consistency: Thin  Diet effective now                   EDUCATION NEEDS:   Education needs have been addressed  Skin:  Skin Assessment: Reviewed RN Assessment  Last BM:  5/10 -type 7  Height:   Ht Readings from Last 1 Encounters:  11/12/22 5\' 10"  (1.778 m)    Weight:   Wt Readings from Last 1 Encounters:  11/12/22 94.3 kg    BMI:  Body mass index is 29.84 kg/m.  Estimated Nutritional Needs:  Kcal:  2200-2400  Protein:  110-125g  Fluid:  2.2L/day   Tilda Franco, MS, RD, LDN Inpatient Clinical Dietitian Contact information available via Amion

## 2022-11-13 NOTE — Progress Notes (Signed)
Attempted to place small bore, positive for CO2 .. ( In Lung) removed tube doing CXR to ensure lungs are fine. Patient does Not want to try again tonight thanks VSS no distress noted  Dr. Freida Busman Notified.  CXR WNL.

## 2022-11-13 NOTE — Consult Note (Signed)
Steve Romero 1970-07-19  [id number].    Requesting MD: Dr. Claudette Head Chief Complaint/Reason for Consult: recurrent pancreatic pseudocyst  HPI:  Steve Romero is a 52 yo male with a history of post-ERCP necrotizing pancreatitis, which he first developed in April 2022 after undergoing an ERCP for a bile leak following laparoscopic cholecystectomy. He ultimately developed a pseudocyst in the neck/body of the pancreas, for which he has had three separate endoscopic cystgastrostomies. The most recent was in December 2023, and the stent was removed on 07/30/22. He initially felt improved and was referred to me as an outpatient to evaluate for surgical options in the event of recurrence of his pseudocyst. 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 He has had mild biliary ductal dilation since then and mildly elevated alk phos, which is currently stable at 168.   He has had baseline chronic abdominal pain and nausea, however this acutely worsened in the last few days. He is now having difficulty with PO intolerance and feels full early. These are the same symptoms he has had previously when he developed a large pseudocyst. He presented to the ED, and a CT scan showed enlargement of the pancreatic pseudocyst to 6.3cm. He has been admitted and surgery and GI consulted.   ROS: Review of Systems  Constitutional:  Negative for chills and fever.  Respiratory:  Negative for shortness of breath.   Gastrointestinal:  Positive for abdominal pain, nausea and vomiting.  Neurological:  Negative for loss of consciousness and weakness.    Family History  Problem Relation Age of Onset   Diabetes Father    Heart disease Father    Heart disease Maternal Grandmother    Stroke Maternal Grandfather    Colon cancer Neg Hx    Esophageal cancer Neg Hx    Inflammatory bowel disease Neg Hx     Liver disease Neg Hx    Pancreatic cancer Neg Hx    Rectal cancer Neg Hx    Stomach cancer Neg Hx     Past Medical History:  Diagnosis Date   Acute pancreatitis without necrosis or infection, unspecified    AKI (acute kidney injury) (HCC) 05/17/2022   Anxiety 10/2014   Ascites    Bipolar disorder (HCC) age 15   COPD (chronic obstructive pulmonary disease) (HCC) 2016   Depression age 42   Enlarged prostate    Hyperlipidemia 2016   Hypertension 2013   Nausea vomiting and diarrhea 06/15/2022   Schizoaffective disorder (HCC) 11/13/2014   Sleep apnea    Does not wear c-pap, sleeps in sitting up position per pt   Thyroid disease 11/2014    Past Surgical History:  Procedure Laterality Date   BALLOON DILATION N/A 11/07/2020   Procedure: BALLOON DILATION;  Surgeon: Lemar Lofty., MD;  Location: [facility] ENDOSCOPY;  Service: Gastroenterology;  Laterality: N/A;   BALLOON DILATION N/A 06/26/2021   Procedure: BALLOON DILATION;  Surgeon: Meridee Score Netty Starring., MD;  Location: [facility] ENDOSCOPY;  Service: Gastroenterology;  Laterality: N/A;   BILIARY DILATION  07/24/2021   Procedure: BILIARY DILATION;  Surgeon: Meridee Score Netty Starring., MD;  Location: [facility] ENDOSCOPY;  Service: Gastroenterology;;   BILIARY DILATION  05/22/2022   Procedure: BILIARY DILATION;  Surgeon: Lemar Lofty., MD;  Location: [facility] ENDOSCOPY;  Service: Gastroenterology;;   BILIARY DILATION  06/16/2022   Procedure: GASTROSTOMY DILATION;  Surgeon: Lemar Lofty., MD;  Location:  [facility] ENDOSCOPY;  Service: Gastroenterology;;   BILIARY STENT PLACEMENT N/A 10/10/2020   Procedure: BILIARY STENT PLACEMENT;  Surgeon: Malissa Hippo, MD;  Location: [facility] ORS;  Service: Endoscopy;  Laterality: N/A;   BILIARY STENT PLACEMENT  11/09/2020   Procedure: BILIARY STENT PLACEMENT;  Surgeon: Meridee Score Netty Starring., MD;  Location: [facility] ENDOSCOPY;  Service: Gastroenterology;;   BILIARY STENT PLACEMENT  11/07/2020   Procedure: BILIARY  STENT PLACEMENT;  Surgeon: Lemar Lofty., MD;  Location: [facility] ENDOSCOPY;  Service: Gastroenterology;;   BILIARY STENT PLACEMENT  11/11/2020   Procedure: BILIARY STENT PLACEMENT;  Surgeon: Lemar Lofty., MD;  Location: [facility] ENDOSCOPY;  Service: Gastroenterology;;   BILIARY STENT PLACEMENT  11/14/2020   Procedure: BILIARY STENT PLACEMENT;  Surgeon: Lemar Lofty., MD;  Location: [facility] ENDOSCOPY;  Service: Gastroenterology;;   BILIARY STENT PLACEMENT N/A 01/29/2021   Procedure: BILIARY STENT PLACEMENT;  Surgeon: Lemar Lofty., MD;  Location: [facility] ENDOSCOPY;  Service: Gastroenterology;  Laterality: N/A;   BILIARY STENT PLACEMENT  06/26/2021   Procedure: BILIARY STENT PLACEMENT;  Surgeon: Meridee Score Netty Starring., MD;  Location: [facility] ENDOSCOPY;  Service: Gastroenterology;;   BILIARY STENT PLACEMENT  07/24/2021   Procedure: BILIARY STENT PLACEMENT;  Surgeon: Lemar Lofty., MD;  Location: [facility] ENDOSCOPY;  Service: Gastroenterology;;   BIOPSY  11/07/2020   Procedure: BIOPSY;  Surgeon: Lemar Lofty., MD;  Location: [facility] ENDOSCOPY;  Service: Gastroenterology;;   BIOPSY  12/20/2020   Procedure: BIOPSY;  Surgeon: Lemar Lofty., MD;  Location: [facility] ENDOSCOPY;  Service: Gastroenterology;;   BIOPSY  06/26/2021   Procedure: BIOPSY;  Surgeon: Lemar Lofty., MD;  Location: [facility] ENDOSCOPY;  Service: Gastroenterology;;   CARPAL TUNNEL RELEASE Left 02/21/2016   Procedure: CARPAL TUNNEL RELEASE;  Surgeon: Vickki Hearing, MD;  [facility];  Service: Orthopedics;  Laterality: Left;   CHOLECYSTECTOMY N/A 10/09/2020   Procedure: LAPAROSCOPIC CHOLECYSTECTOMY;  Surgeon: Lucretia Roers, MD;  Location: [facility];  Service: General;  Laterality: N/A;   CYST ENTEROSTOMY N/A 11/07/2020   Procedure: CYST ENTEROSTOMY;  Surgeon: Lemar Lofty., MD;  Location: [facility] ENDOSCOPY;  Service: Gastroenterology;  Laterality: N/A;   CYST GASTROSTOMY  11/11/2020    Procedure: CYST NECROSECTOMY;  Surgeon: Meridee Score Netty Starring., MD;  Location: [facility] ENDOSCOPY;  Service: Gastroenterology;;   CYST GASTROSTOMY  11/14/2020   Procedure: CYST NECROSECTOMY;  Surgeon: Lemar Lofty., MD;  Location: [facility] ENDOSCOPY;  Service: Gastroenterology;;   CYST GASTROSTOMY  06/16/2022   Procedure: CYST GASTROSTOMY;  Surgeon: Lemar Lofty., MD;  Location: [facility] ENDOSCOPY;  Service: Gastroenterology;;   CYST REMOVAL HAND     CYSTOSCOPY  06/26/2021   Procedure: CYSTOGASTROSTOMY;  Surgeon: Mansouraty, Netty Starring., MD;  Location: [facility] ENDOSCOPY;  Service: Gastroenterology;;   ENDOSCOPIC RETROGRADE CHOLANGIOPANCREATOGRAPHY (ERCP) WITH PROPOFOL N/A 11/09/2020   Procedure: ENDOSCOPIC RETROGRADE CHOLANGIOPANCREATOGRAPHY (ERCP) WITH PROPOFOL;  Surgeon: Lemar Lofty., MD;  Location: [facility] ENDOSCOPY;  Service: Gastroenterology;  Laterality: N/A;   ENDOSCOPIC RETROGRADE CHOLANGIOPANCREATOGRAPHY (ERCP) WITH PROPOFOL N/A 12/20/2020   Procedure: ENDOSCOPIC RETROGRADE CHOLANGIOPANCREATOGRAPHY (ERCP) WITH PROPOFOL;  Surgeon: Meridee Score Netty Starring., MD;  Location: [facility] ENDOSCOPY;  Service: Gastroenterology;  Laterality: N/A;   ENDOSCOPIC RETROGRADE CHOLANGIOPANCREATOGRAPHY (ERCP) WITH PROPOFOL N/A 01/29/2021   Procedure: ENDOSCOPIC RETROGRADE CHOLANGIOPANCREATOGRAPHY (ERCP) WITH PROPOFOL;  Surgeon: Meridee Score Netty Starring., MD;  Location: [facility] ENDOSCOPY;  Service: Gastroenterology;  Laterality: N/A;   ENDOSCOPIC RETROGRADE CHOLANGIOPANCREATOGRAPHY (ERCP) WITH PROPOFOL N/A 07/24/2021   Procedure: ENDOSCOPIC RETROGRADE CHOLANGIOPANCREATOGRAPHY (ERCP) WITH PROPOFOL;  Surgeon: Lemar Lofty., MD;  Location: [facility] ENDOSCOPY;  Service: Gastroenterology;  Laterality: N/A;   ERCP N/A 10/10/2020   Procedure: ENDOSCOPIC RETROGRADE CHOLANGIOPANCREATOGRAPHY (ERCP);  Surgeon: Malissa Hippo, MD;  Location: [facility];  Service: Endoscopy;  Laterality: N/A;   ERCP     ERCP N/A 05/22/2022    Procedure: ENDOSCOPIC RETROGRADE CHOLANGIOPANCREATOGRAPHY (ERCP);  Surgeon: Lemar Lofty., MD;  Location: [facility] ENDOSCOPY;  Service: Gastroenterology;  Laterality: N/A;   ESOPHAGOGASTRODUODENOSCOPY N/A 11/11/2020   Procedure: ESOPHAGOGASTRODUODENOSCOPY (EGD);  Surgeon: Lemar Lofty., MD;  Location: [facility] ENDOSCOPY;  Service: Gastroenterology;  Laterality: N/A;   ESOPHAGOGASTRODUODENOSCOPY N/A 11/14/2020   Procedure: ESOPHAGOGASTRODUODENOSCOPY (EGD);  Surgeon: Lemar Lofty., MD;  Location: [facility] ENDOSCOPY;  Service: Gastroenterology;  Laterality: N/A;   ESOPHAGOGASTRODUODENOSCOPY (EGD) WITH PROPOFOL N/A 11/09/2020   Procedure: ESOPHAGOGASTRODUODENOSCOPY (EGD) WITH PROPOFOL;  Surgeon: Meridee Score Netty Starring., MD;  Location: [facility] ENDOSCOPY;  Service: Gastroenterology;  Laterality: N/A;   ESOPHAGOGASTRODUODENOSCOPY (EGD) WITH PROPOFOL N/A 11/07/2020   Procedure: ESOPHAGOGASTRODUODENOSCOPY (EGD) WITH PROPOFOL;  Surgeon: Meridee Score Netty Starring., MD;  Location: [facility] ENDOSCOPY;  Service: Gastroenterology;  Laterality: N/A;   ESOPHAGOGASTRODUODENOSCOPY (EGD) WITH PROPOFOL N/A 12/20/2020   Procedure: ESOPHAGOGASTRODUODENOSCOPY (EGD) WITH PROPOFOL;  Surgeon: Meridee Score Netty Starring., MD;  Location: [facility] ENDOSCOPY;  Service: Gastroenterology;  Laterality: N/A;   ESOPHAGOGASTRODUODENOSCOPY (EGD) WITH PROPOFOL N/A 06/26/2021   Procedure: ESOPHAGOGASTRODUODENOSCOPY (EGD) WITH PROPOFOL;  Surgeon: Meridee Score Netty Starring., MD;  Location: [facility] ENDOSCOPY;  Service: Gastroenterology;  Laterality: N/A;   ESOPHAGOGASTRODUODENOSCOPY (EGD) WITH PROPOFOL N/A 07/24/2021   Procedure: ESOPHAGOGASTRODUODENOSCOPY (EGD) WITH PROPOFOL;  Surgeon: Meridee Score Netty Starring., MD;  Location: [facility] ENDOSCOPY;  Service: Gastroenterology;  Laterality: N/A;  AXIOS STENT   ESOPHAGOGASTRODUODENOSCOPY (EGD) WITH PROPOFOL N/A 05/22/2022   Procedure: ESOPHAGOGASTRODUODENOSCOPY (EGD) WITH PROPOFOL;  Surgeon: Meridee Score Netty Starring., MD;   Location: [facility] ENDOSCOPY;  Service: Gastroenterology;  Laterality: N/A;   ESOPHAGOGASTRODUODENOSCOPY (EGD) WITH PROPOFOL N/A 06/16/2022   Procedure: ESOPHAGOGASTRODUODENOSCOPY (EGD) WITH PROPOFOL;  Surgeon: Meridee Score Netty Starring., MD;  Location: [facility]OSCOPY;  Service: Gastroenterology;  Laterality: N/A;   ESOPHAGOGASTRODUODENOSCOPY (EGD) WITH PROPOFOL N/A 07/30/2022   Procedure: ESOPHAGOGASTRODUODENOSCOPY (EGD) WITH PROPOFOL;  Surgeon: Meridee Score Netty Starring., MD;  Location: [facility] ENDOSCOPY;  Service: Gastroenterology;  Laterality: N/A;   EUS  11/14/2020   Procedure: UPPER ENDOSCOPIC ULTRASOUND (EUS) LINEAR;  Surgeon: Lemar Lofty., MD;  Location: [facility] ENDOSCOPY;  Service: Gastroenterology;;   EUS N/A 06/26/2021   Procedure: UPPER ENDOSCOPIC ULTRASOUND (EUS) RADIAL;  Surgeon: Lemar Lofty., MD;  Location: [facility] ENDOSCOPY;  Service: Gastroenterology;  Laterality: N/A;   EUS N/A 05/22/2022   Procedure: UPPER ENDOSCOPIC ULTRASOUND (EUS) LINEAR;  Surgeon: Lemar Lofty., MD;  Location: [facility] ENDOSCOPY;  Service: Gastroenterology;  Laterality: N/A;   EUS N/A 06/16/2022   Procedure: UPPER ENDOSCOPIC ULTRASOUND (EUS) LINEAR;  Surgeon: Lemar Lofty., MD;  Location: [facility] ENDOSCOPY;  Service: Gastroenterology;  Laterality: N/A;   FINE NEEDLE ASPIRATION  05/22/2022   Procedure: FINE NEEDLE ASPIRATION;  Surgeon: Meridee Score Netty Starring., MD;  Location: [facility] ENDOSCOPY;  Service: Gastroenterology;;   GASTROINTESTINAL STENT REMOVAL  12/20/2020   Procedure: GASTROINTESTINAL STENT REMOVAL;  Surgeon: Lemar Lofty., MD;  Location: [facility] ENDOSCOPY;  Service: Gastroenterology;;  cyst gastrostomy stent and double pig tail stents x2 removed   HERNIA REPAIR Left 2002   groin   INGUINAL HERNIA REPAIR Left 04/05/2017   Procedure: RECURRENT HERNIA REPAIR INGUINAL ADULT WITH MESH;  Surgeon: Franky Macho, MD;  Location: [facility];  Service: General;  Laterality: Left;   MASS EXCISION Right  04/29/2020  Procedure: EXCISION MASS RIGHT WRIST;  Surgeon: Betha Loa, MD;  Location: [facility];  Service: Orthopedics;  Laterality: Right;   MASS EXCISION Right 10/09/2020   Procedure: EXCISION MASS, ABDOMINAL WALL, 2CM;  Surgeon: Lucretia Roers, MD;  Location: [facility];  Service: General;  Laterality: Right;   PANCREATIC STENT PLACEMENT  06/26/2021   Procedure: AXIOS STENT PLACEMENT;  Surgeon: Lemar Lofty., MD;  Location: [facility] ENDOSCOPY;  Service: Gastroenterology;;   PANCREATIC STENT PLACEMENT  06/16/2022   Procedure: PANCREATIC STENT PLACEMENT;  Surgeon: Lemar Lofty., MD;  Location: [facility] ENDOSCOPY;  Service: Gastroenterology;;   REMOVAL OF STONES  12/20/2020   Procedure: REMOVAL OF STONES;  Surgeon: Lemar Lofty., MD;  Location: [facility] ENDOSCOPY;  Service: Gastroenterology;;   REMOVAL OF STONES  07/24/2021   Procedure: REMOVAL OF STONES;  Surgeon: Lemar Lofty., MD;  Location: [facility] ENDOSCOPY;  Service: Gastroenterology;;   REMOVAL OF STONES  05/22/2022   Procedure: REMOVAL OF STONES;  Surgeon: Lemar Lofty., MD;  Location: [facility] ENDOSCOPY;  Service: Gastroenterology;;   Dennison Mascot N/A 10/10/2020   Procedure: Dennison Mascot;  Surgeon: Malissa Hippo, MD;  Location: [facility];  Service: Endoscopy;  Laterality: N/A;   SPHINCTEROTOMY  07/24/2021   Procedure: SPHINCTEROTOMY;  Surgeon: Mansouraty, Netty Starring., MD;  Location: [facility] ENDOSCOPY;  Service: Gastroenterology;;   Burman [place] CHOLANGIOSCOPY N/A 12/20/2020   Procedure: WGNFAOZH CHOLANGIOSCOPY;  Surgeon: Lemar Lofty., MD;  Location: [facility] ENDOSCOPY;  Service: Gastroenterology;  Laterality: N/A;   SPYGLASS CHOLANGIOSCOPY N/A 07/24/2021   Procedure: SPYGLASS CHOLANGIOSCOPY;  Surgeon: Lemar Lofty., MD;  Location: [facility] ENDOSCOPY;  Service: Gastroenterology;  Laterality: N/A;   STENT REMOVAL  11/09/2020   Procedure: STENT REMOVAL;  Surgeon: Lemar Lofty., MD;   Location: [facility] ENDOSCOPY;  Service: Gastroenterology;;   Francine Graven REMOVAL  11/11/2020   Procedure: STENT REMOVAL;  Surgeon: Lemar Lofty., MD;  Location: [facility] ENDOSCOPY;  Service: Gastroenterology;;   Francine Graven REMOVAL  11/14/2020   Procedure: STENT REMOVAL;  Surgeon: Lemar Lofty., MD;  Location: [facility] ENDOSCOPY;  Service: Gastroenterology;;   Francine Graven REMOVAL  12/20/2020   Procedure: STENT REMOVAL;  Surgeon: Lemar Lofty., MD;  Location: [facility] ENDOSCOPY;  Service: Gastroenterology;;  biliary x2   STENT REMOVAL  07/24/2021   Procedure: AXIOS STENT REMOVAL;  Surgeon: Lemar Lofty., MD;  Location: [facility] ENDOSCOPY;  Service: Gastroenterology;;   Francine Graven REMOVAL  07/30/2022   Procedure: STENT REMOVAL;  Surgeon: Lemar Lofty., MD;  Location: [facility] ENDOSCOPY;  Service: Gastroenterology;;   UPPER ESOPHAGEAL ENDOSCOPIC ULTRASOUND (EUS) N/A 11/07/2020   Procedure: UPPER ESOPHAGEAL ENDOSCOPIC ULTRASOUND (EUS);  Surgeon: Lemar Lofty., MD;  Location: [facility] ENDOSCOPY;  Service: Gastroenterology;  Laterality: N/A;   UPPER GASTROINTESTINAL ENDOSCOPY     WOUND DEBRIDEMENT  11/09/2020   Procedure: CYST NECROSECTOMY;  Surgeon: Meridee Score Netty Starring., MD;  Location: [facility] ENDOSCOPY;  Service: Gastroenterology;;    Social History:  reports that he has been smoking cigarettes. He has a 6.50 pack-year smoking history. He has never used smokeless tobacco. He reports that he does not drink alcohol and does not use drugs.  Allergies:  Allergies  Allergen Reactions   Desipramine Nausea Only and Rash    Medications Prior to Admission  Medication Sig Dispense Refill   insulin glargine, 1 Unit Dial, (TOUJEO SOLOSTAR) 300 UNIT/ML Solostar Pen Inject 10 Units into the skin at bedtime. (Patient taking differently: Inject 5 Units into the skin in the morning and at bedtime.) 3 mL PRN  lipase/protease/amylase (CREON) 36000 UNITS CPEP capsule Take 2 capsules (72,000 Units total) by mouth 3  (three) times daily with meals. (Patient taking differently: Take 72,000-108,000 Units by mouth in the morning and at bedtime.) 270 capsule 1   traMADol (ULTRAM) 50 MG tablet Take 50 mg by mouth as needed for moderate pain or severe pain.     Continuous Blood Gluc Receiver (FREESTYLE LIBRE READER) DEVI 1 Units by Does not apply route daily. UAD to test BGs daily. Dx E11.9 1 each 1   Continuous Blood Gluc Sensor (FREESTYLE LIBRE 3 SENSOR) MISC Place 1 sensor on the skin every 14 days. Use to check glucose continuously e11.9 6 each 3   Insulin Pen Needle (BD PEN NEEDLE NANO U/F) 32G X 4 MM MISC UAD to administer insulin 100 each 3   ondansetron (ZOFRAN-ODT) 4 MG disintegrating tablet Take 1 tablet (4 mg total) by mouth every 8 (eight) hours as needed for nausea or vomiting. (Patient not taking: Reported on 11/12/2022) 60 tablet 1     Physical Exam: Blood pressure (!) 125/100, pulse 62, temperature 97.8 F (36.6 C), temperature source Oral, resp. rate 16, height 5\' 10"  (1.778 m), weight 94.3 kg, SpO2 98 %. General: resting comfortably, appears stated age, no apparent distress Neurological: alert and oriented, no focal deficits HEENT: normocephalic, atraumatic, no scleral icterus Respiratory: normal work of breathing on room air Abdomen: soft, nondistended, mildly tender. No rebound tenderness or guarding. Extremities: warm and well-perfused, no deformities, moving all extremities spontaneously Psychiatric: normal mood and affect Skin: warm and dry, no jaundice, no rashes or lesions   Results for orders placed or performed during the hospital encounter of 11/12/22 (from the past 48 hour(s))  Comprehensive metabolic panel     Status: Abnormal   Collection Time: 11/12/22  8:48 AM  Result Value Ref Range   Sodium 132 (L) 135 - 145 mmol/L   Potassium 3.5 3.5 - 5.1 mmol/L   Chloride 97 (L) 98 - 111 mmol/L   CO2 21 (L) 22 - 32 mmol/L   Glucose, Bld 343 (H) 70 - 99 mg/dL    Comment: Glucose  reference range applies only to samples taken after fasting for at least 8 hours.   BUN 23 (H) 6 - 20 mg/dL   Creatinine, Ser 4.13 0.61 - 1.24 mg/dL   Calcium 8.8 (L) 8.9 - 10.3 mg/dL   Total Protein 7.3 6.5 - 8.1 g/dL   Albumin 4.3 3.5 - 5.0 g/dL   AST 21 15 - 41 U/L   ALT 42 0 - 44 U/L   Alkaline Phosphatase 235 (H) 38 - 126 U/L   Total Bilirubin 1.2 0.3 - 1.2 mg/dL   GFR, Estimated >24 >40 mL/min    Comment: (NOTE) Calculated using the CKD-EPI Creatinine Equation (2021)    Anion gap 14 5 - 15    Comment: Performed at [facility], [address], [place], [place] [address]  Lipase, blood     Status: Abnormal   Collection Time: 11/12/22  8:48 AM  Result Value Ref Range   Lipase 152 (H) 11 - 51 U/L    Comment: Performed at [facility], [address], [place], [place] [address]  CBC with Differential     Status: Abnormal   Collection Time: 11/12/22  8:48 AM  Result Value Ref Range   WBC 10.2 4.0 - 10.5 K/uL   RBC 6.09 (H) 4.22 - 5.81 MIL/uL   Hemoglobin 18.3 (H) 13.0 - 17.0  g/dL   HCT 16.1 09.6 - 04.5 %   MCV 84.2 80.0 - 100.0 fL   MCH 30.0 26.0 - 34.0 pg   MCHC 35.7 30.0 - 36.0 g/dL   RDW 40.9 81.1 - 91.4 %   Platelets 295 150 - 400 K/uL   nRBC 0.0 0.0 - 0.2 %   Neutrophils Relative % 74 %   Neutro Abs 7.6 1.7 - 7.7 K/uL   Lymphocytes Relative 17 %   Lymphs Abs 1.8 0.7 - 4.0 K/uL   Monocytes Relative 6 %   Monocytes Absolute 0.6 0.1 - 1.0 K/uL   Eosinophils Relative 1 %   Eosinophils Absolute 0.1 0.0 - 0.5 K/uL   Basophils Relative 1 %   Basophils Absolute 0.1 0.0 - 0.1 K/uL   Immature Granulocytes 1 %   Abs Immature Granulocytes 0.05 0.00 - 0.07 K/uL    Comment: Performed at [facility], [address] [place], [place] [address]  Troponin I (High Sensitivity)     Status: None   Collection Time: 11/12/22  8:48 AM  Result Value Ref Range   Troponin I (High Sensitivity) 3 <18 ng/L    Comment:  (NOTE) Elevated high sensitivity troponin I (hsTnI) values and significant  changes across serial measurements may suggest ACS but many other  chronic and acute conditions are known to elevate hsTnI results.  Refer to the "Links" section for chest pain algorithms and additional  guidance. Performed at [facility], [address], [place], [place] [address]   Troponin I (High Sensitivity)     Status: None   Collection Time: 11/12/22 11:00 AM  Result Value Ref Range   Troponin I (High Sensitivity) 4 <18 ng/L    Comment: (NOTE) Elevated high sensitivity troponin I (hsTnI) values and significant  changes across serial measurements may suggest ACS but many other  chronic and acute conditions are known to elevate hsTnI results.  Refer to the "Links" section for chest pain algorithms and additional  guidance. Performed at [facility], [address] [place], [place] [address]   CBG monitoring, ED     Status: Abnormal   Collection Time: 11/12/22  1:46 PM  Result Value Ref Range   Glucose-Capillary 303 (H) 70 - 99 mg/dL    Comment: Glucose reference range applies only to samples taken after fasting for at least 8 hours.  Glucose, capillary     Status: Abnormal   Collection Time: 11/12/22  5:19 PM  Result Value Ref Range   Glucose-Capillary 205 (H) 70 - 99 mg/dL    Comment: Glucose reference range applies only to samples taken after fasting for at least 8 hours.  Glucose, capillary     Status: Abnormal   Collection Time: 11/12/22  9:12 PM  Result Value Ref Range   Glucose-Capillary 149 (H) 70 - 99 mg/dL    Comment: Glucose reference range applies only to samples taken after fasting for at least 8 hours.  Comprehensive metabolic panel     Status: Abnormal   Collection Time: 11/13/22  6:27 AM  Result Value Ref Range   Sodium 135 135 - 145 mmol/L   Potassium 4.0 3.5 - 5.1 mmol/L   Chloride 98 98 - 111 mmol/L   CO2 29 22 - 32 mmol/L   Glucose,  Bld 182 (H) 70 - 99 mg/dL    Comment: Glucose reference range applies only to samples taken after fasting for at least 8 hours.   BUN 17  6 - 20 mg/dL   Creatinine, Ser 1.61 0.61 - 1.24 mg/dL   Calcium 8.3 (L) 8.9 - 10.3 mg/dL   Total Protein 6.1 (L) 6.5 - 8.1 g/dL   Albumin 3.5 3.5 - 5.0 g/dL   AST 14 (L) 15 - 41 U/L   ALT 28 0 - 44 U/L   Alkaline Phosphatase 168 (H) 38 - 126 U/L   Total Bilirubin 0.9 0.3 - 1.2 mg/dL   GFR, Estimated >09 >60 mL/min    Comment: (NOTE) Calculated using the CKD-EPI Creatinine Equation (2021)    Anion gap 8 5 - 15    Comment: Performed at [facility], [address], [place], [place] [address]  CBC     Status: None   Collection Time: 11/13/22  6:27 AM  Result Value Ref Range   WBC 8.9 4.0 - 10.5 K/uL   RBC 5.11 4.22 - 5.81 MIL/uL   Hemoglobin 15.6 13.0 - 17.0 g/dL   HCT 81.1 91.4 - 78.2 %   MCV 86.3 80.0 - 100.0 fL   MCH 30.5 26.0 - 34.0 pg   MCHC 35.4 30.0 - 36.0 g/dL   RDW 95.6 21.3 - 08.6 %   Platelets 246 150 - 400 K/uL   nRBC 0.0 0.0 - 0.2 %    Comment: Performed at [facility], [address] [place], [place] [address]  Glucose, capillary     Status: Abnormal   Collection Time: 11/13/22  7:42 AM  Result Value Ref Range   Glucose-Capillary 152 (H) 70 - 99 mg/dL    Comment: Glucose reference range applies only to samples taken after fasting for at least 8 hours.   CT ABDOMEN PELVIS W CONTRAST  Result Date: 11/12/2022 CLINICAL DATA:  Upper abdominal pain. EXAM: CT ABDOMEN AND PELVIS WITH CONTRAST TECHNIQUE: Multidetector CT imaging of the abdomen and pelvis was performed using the standard protocol following bolus administration of intravenous contrast. RADIATION DOSE REDUCTION: This exam was performed according to the departmental dose-optimization program which includes automated exposure control, adjustment of the mA and/or kV according to patient size and/or use of iterative reconstruction technique.  CONTRAST:  OMNIPAQUE IOHEXOL 300 MG/ML  [facility]N COMPARISON:  October 28, 2022. FINDINGS: Lower chest: No acute abnormality. Hepatobiliary: Status post cholecystectomy. Stable dilatation of common bile duct is noted which may be due to post cholecystectomy status or potentially due to distal common bile duct obstruction secondary to pancreatic inflammation. Stable complex low density is seen involving the caudate lobe as well as lateral segmental left hepatic lobe in inferior portion of medial segment of left hepatic lobe. Pancreas: 6.3 x 4.8 cm fluid collection is noted between the distal stomach and pancreas which is significantly enlarged compared to prior exam and most consistent with pancreatic pseudocyst. Mild inflammatory changes are seen involving the pancreatic body suggesting pancreatitis. Mild pancreatic ductal dilatation is noted in the tail. Spleen: Normal in size without focal abnormality. Adrenals/Urinary Tract: Adrenal glands are unremarkable. Kidneys are normal, without renal calculi, focal lesion, or hydronephrosis. Bladder is unremarkable. Stomach/Bowel: Stomach is within normal limits. Appendix appears normal. No evidence of bowel wall thickening, distention, or inflammatory changes. Vascular/Lymphatic: No significant vascular findings are present. No enlarged abdominal or pelvic lymph nodes. Reproductive: Prostate is unremarkable. Other: No abdominal wall hernia or abnormality. No abdominopelvic ascites. Musculoskeletal: No acute or significant osseous findings. IMPRESSION: 6.3 x 4.8 cm fluid collection is noted between the distal stomach and pancreas which is significantly enlarged compared to  prior exam and most consistent with enlarging pancreatic pseudocyst. Mild inflammatory changes are noted involving the pancreatic body suggesting pancreatitis. Mild pancreatic ductal dilatation is noted in the tail which was present on prior exam. There is continued dilatation of the common bile duct  which may be due to post cholecystectomy status, but potentially may be due to obstruction of the distal common bile duct secondary to the pseudo cyst and pancreatic inflammation. Stable complex low density is seen involving the left hepatic lobe and caudate lobe as noted on prior exams. It is uncertain if this represents focal inflammation or hepatic infarction as noted on prior exams. Electronically Signed   By: Lupita Raider M.D.   On: 11/12/2022 12:09      Assessment/Plan This is a 52 yo male with a history of post-ERCP pancreatitis, with a recurrent pancreatic pseudocyst. He has previously had multiple ERCPs demonstrated pancreatic duct disconnection, which is the etiology of his recurrent pseudocyst. I personally reviewed his labs, imaging and notes. His newest CT shows enlargement of the pseudocyst. His early satiety and vomiting are presumably due to gastric outlet obstruction secondary to mass effect of the cyst. As this cyst has recurred with multiple endoscopic cystgastrostomy stents, I recommend surgical intervention. I would favor a surgical cystgastrostomy over distal pancreatectomy, as he would lose a large amount of pancreatic parenchyma with distal pancreatectomy. There would also be significant risk of bleeding and injury to the portal vein, as the neck of the pancreas overlying the vein would need to be dissected based on I imaging. A surgical cystgastrostomy would hopefully provide more durable long-term drainage of his disconnected pancreatic tail remnant. I will discuss this further with GI. If we proceed with surgery, this would potentially be next week and the patient will need to be transferred to [facility]. I reviewed this plan with the patient.  In the meantime nutrition needs to be optimized. Nutrition consult has been placed. If an NJ feeding tube cannot be placed for enteral feeds, will need to start TPN. Surgery will follow.   Sophronia Simas, MD [facility] Surgery General,  Hepatobiliary and Pancreatic Surgery 11/13/22 9:38 AM

## 2022-11-13 NOTE — Progress Notes (Addendum)
Daily Progress Note  DOA: 11/12/2022 Hospital Day: 2 Chief Complaint:  enlarging pseudocyst / abdominal pain      Assessment and Plan:    Brief Narrative:  Steve Romero is a 52 y.o. year old male whose past medical history includes, but isn't necessarily limited to, bipolar disorder, schizoaffective disorder, hypertension, hyperlipidemia, OSA, complicated necrotizing pancreatitis (secondary to gallstone/post-ERCP pancreatitis with eventual smoldering complications of biliary obstruction and recurrent pseudocysts), chronic SVT, chronic abdominal discomfort, colon polyps (TAs).   Recurrent and enlarging pancreatic pseudocyst following removal of AXIOS stent in late January.  Admitted now with worsening abdominal pain, nausea / vomiting and probable mild pancreatitis.  -Hepatobiliary Surgery saw him this am. Note pending but per patient plan is for transfer to [facility] for surgery next week.   -Supportive care in the interim. Still having N/V. May need small bowel feeding. 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  Liver chemistries are at baseline without worsening cholestasis.    Possible cirrhosis by CT scan  No evidence of portal HTN.  Further workup in outpatient setting.    Attending Physician Note   I have taken an interval history, reviewed the chart and examined the patient. I performed a substantive portion of this encounter, including complete performance of at least one of the key components, in conjunction with the APP. I agree with the APP's note, impression and recommendations with my edits. My additional impressions and recommendations are as follows.   Enlarging recurrent pseudocyst. Reviewed with Dr. Freida Busman and surgical mgmt is planned which will require transfer to [facility], timing per [facility]. Supportive care in the interim. Consider  enteral feedings if he is unable to take enough orally.  GI service will go to standby and will be available as needed.    Claudette Head, MD [facility] See AMION, [facility], for our on call provider     Subjective / New Events:  Still having some upper abdominal pain but looks more comfortable than yesterday. Vomited dinner last evening. Tolerated small amount of breakfast this am but nauseated  Objective:   Recent Labs    11/12/22 0848 11/13/22 0627  WBC 10.2 8.9  HGB 18.3* 15.6  HCT 51.3 44.1  PLT 295 246   BMET Recent Labs    11/12/22 0848 11/13/22 0627  NA 132* 135  K 3.5 4.0  CL 97* 98  CO2 21* 29  GLUCOSE 343* 182*  BUN 23* 17  CREATININE 1.11 0.87  CALCIUM 8.8* 8.3*   LFT Recent Labs    11/13/22 0627  PROT 6.1*  ALBUMIN 3.5  AST 14*  ALT 28  ALKPHOS 168*  BILITOT 0.9   Imaging:  CT ABDOMEN PELVIS W CONTRAST CLINICAL DATA:  Upper abdominal pain.  EXAM: CT ABDOMEN AND PELVIS WITH CONTRAST  TECHNIQUE: Multidetector CT imaging of the abdomen and pelvis was performed using the standard protocol following bolus administration of intravenous contrast.  RADIATION DOSE REDUCTION: This exam was performed according to the departmental dose-optimization program which includes automated exposure control, adjustment of the mA and/or kV according to patient size and/or use of iterative reconstruction technique.  CONTRAST:  OMNIPAQUE IOHEXOL 300 MG/ML  SOLN  COMPARISON:  October 28, 2022.  FINDINGS: Lower chest: No acute abnormality.  Hepatobiliary: Status post cholecystectomy. Stable dilatation of common bile duct is noted which may be due to post  cholecystectomy status or potentially due to distal common bile duct obstruction secondary to pancreatic inflammation. Stable complex low density is seen involving the caudate lobe as well as lateral segmental left hepatic lobe in inferior portion of medial segment of left hepatic lobe.  Pancreas: 6.3 x  4.8 cm fluid collection is noted between the distal stomach and pancreas which is significantly enlarged compared to prior exam and most consistent with pancreatic pseudocyst. Mild inflammatory changes are seen involving the pancreatic body suggesting pancreatitis. Mild pancreatic ductal dilatation is noted in the tail.  Spleen: Normal in size without focal abnormality.  Adrenals/Urinary Tract: Adrenal glands are unremarkable. Kidneys are normal, without renal calculi, focal lesion, or hydronephrosis. Bladder is unremarkable.  Stomach/Bowel: Stomach is within normal limits. Appendix appears normal. No evidence of bowel wall thickening, distention, or inflammatory changes.  Vascular/Lymphatic: No significant vascular findings are present. No enlarged abdominal or pelvic lymph nodes.  Reproductive: Prostate is unremarkable.  Other: No abdominal wall hernia or abnormality. No abdominopelvic ascites.  Musculoskeletal: No acute or significant osseous findings.  IMPRESSION: 6.3 x 4.8 cm fluid collection is noted between the distal stomach and pancreas which is significantly enlarged compared to prior exam and most consistent with enlarging pancreatic pseudocyst. Mild inflammatory changes are noted involving the pancreatic body suggesting pancreatitis. Mild pancreatic ductal dilatation is noted in the tail which was present on prior exam. There is continued dilatation of the common bile duct which may be due to post cholecystectomy status, but potentially may be due to obstruction of the distal common bile duct secondary to the pseudo cyst and pancreatic inflammation.  Stable complex low density is seen involving the left hepatic lobe and caudate lobe as noted on prior exams. It is uncertain if this represents focal inflammation or hepatic infarction as noted on prior exams.  Electronically Signed   By: Lupita Raider M.D.   On: 11/12/2022 12:09     Scheduled inpatient  medications:   docusate sodium  100 mg Oral BID   enoxaparin (LOVENOX) injection  40 mg Subcutaneous QHS   insulin aspart  0-15 Units Subcutaneous TID AC & HS   insulin glargine-yfgn  5 Units Subcutaneous QHS   Continuous inpatient infusions:   sodium chloride 150 mL/hr at 11/13/22 1017   PRN inpatient medications: acetaminophen **OR** acetaminophen, albuterol, hydrALAZINE, HYDROmorphone (DILAUDID) injection, ondansetron **OR** ondansetron (ZOFRAN) IV, polyethylene glycol, traZODone  Vital signs in last 24 hours: Temp:  [97.7 F (36.5 C)-98 F (36.7 C)] 97.8 F (36.6 C) (05/10 0409) Pulse Rate:  [62-74] 62 (05/10 0409) Resp:  [16-23] 16 (05/10 0409) BP: (125-142)/(90-103) 125/100 (05/10 0409) SpO2:  [91 %-98 %] 98 % (05/10 0409) Last BM Date : 11/13/22  Intake/Output Summary (Last 24 hours) at 11/13/2022 1118 Last data filed at 11/13/2022 1017 Gross per 24 hour  Intake 2843.66 ml  Output --  Net 2843.66 ml    Intake/Output from previous day: 05/09 0701 - 05/10 0700 In: 999 [IV Piggyback:999] Out: -  Intake/Output this shift: Total I/O In: 2843.7 [P.O.:360; I.V.:2483.7] Out: -    Physical Exam:  General: Alert in NAD Heart:  Regular rate and rhythm.  Pulmonary: Normal respiratory effort Abdomen: Soft, protuberant, nontender today, hypoactive bowel sounds. Extremities: No lower extremity edema  Neurologic: Alert and oriented Psych: Pleasant. Cooperative. Insight appears normal.    Principal Problem:   Pancreatic pseudocyst Active Problems:   Acute on chronic pancreatitis (HCC)   COPD (chronic obstructive pulmonary disease) (HCC)   Abnormal LFTs  Abdominal pain, chronic, epigastric     LOS: 1 day   Willette Cluster ,NP 11/13/2022, 11:18 AM

## 2022-11-13 NOTE — Progress Notes (Signed)
[facility] Progress Note  Patient: Steve Romero    ZOX:[id number]  DOA: 11/12/2022    Date of Service: the patient was seen and examined on 11/13/2022  Brief hospital course: 52 year old male with past medical history of diabetes mellitus type 2 on insulin, bipolar disorder and chronic pancreatitis with known pancreatic pseudocyst after complications from elective laparoscopic cholecystectomy in April 2022.  He had subsequent issues with severe necrotizing pancreatitis after ERCP and then subsequently developed a biliary stricture for which she had a metal CBD stent and balloon sphincterotomy in November 2023.  Patient has had recurrent pseudocyst which has been drained endoscopically multiple times by GI.  Patient continues to have chronic abdominal pain and was seen in consultation by Dr. Freida Busman of hepatobiliary surgery who recommended continued observation.  Patient continues to have abdominal pain, but presented to the hospital on the morning of 5/9 with worsening abdominal pain with associated nausea times several days.  Lab work noteworthy for glucose of 343, alkaline phosphatase of 235 and lipase of 152.  CT scan noted evidence of significantly large pancreatic pseudocyst up to 6.3 cm.  Gastroenterology consulted, who in turn have also consulted hepatobiliary surgery.   Assessment and Plan: Acute on chronic pancreatitis with recurrent pseudocyst: N.p.o. with IV fluids and pain and nausea control.  Appreciate GI help.  Patient seen by hepatobiliary surgery who feel a surgical cystogastrostomy may be patient's best option and better outcome than partial pancreatectomy.  They will discuss with GI and decide to proceed, transfer patient to [facility] for surgery next week.  Starting TPN in the interim.  Diabetes mellitus: N.p.o., every 4 hours sliding scale  COPD: Stable  Overweight: Meets criteria for BMI greater than 25   Body mass index is 29.84 kg/m.         Consultants: Hepatobiliary surgery Gastroenterology  Procedures: None  Antimicrobials: None  Code Status: Full code   Subjective: Patient with some abdominal pain although better  Objective: Vital signs were reviewed and unremarkable. Vitals:   11/13/22 0005 11/13/22 0409  BP: (!) 139/90 (!) 125/100  Pulse: 71 62  Resp: 18 16  Temp: 97.9 F (36.6 C) 97.8 F (36.6 C)  SpO2: 97% 98%    Intake/Output Summary (Last 24 hours) at 11/13/2022 0831 Last data filed at 11/12/2022 1030 Gross per 24 hour  Intake 999 ml  Output --  Net 999 ml   Filed Weights   11/12/22 0812  Weight: 94.3 kg   Body mass index is 29.84 kg/m.  Exam:  General: Alert and oriented x 3, no acute distress HEENT: Normocephalic atraumatic, mucous membranes are slightly dry Cardiovascular: Regular rate and rhythm, S1-S2 Respiratory: Clear to auscultation bilaterally, Abdomen: Soft, mild tenderness especially in the upper quadrants, nondistended, hypoactive bowel sounds Musculoskeletal: No clubbing or cyanosis or edema Skin: No skin breaks, tears or lesions Psychiatry: Appropriate, no evidence of psychoses Neurology: No focal deficits  Data Reviewed: CBGs improved.  Alkaline phosphatase down to 168.  Disposition:  Status is: Inpatient Remains inpatient appropriate because:  -Improvement in pancreatitis -Decision on cystogastrostomy -Transfer to [facility]    Anticipated discharge date: Unknown  Family Communication: Will call sister DVT Prophylaxis: enoxaparin (LOVENOX) injection 40 mg Start: 11/12/22 2200 SCDs Start: 11/12/22 1332    Author: Hollice Espy ,MD 11/13/2022 8:31 AM  To reach On-call, see care teams to locate the attending and reach out via [email or web address]. Between 7PM-7AM, please contact night-coverage If you still have difficulty reaching the attending provider, please  page the [facility] (Director on Call) for [facility] on amion for assistance.

## 2022-11-13 NOTE — TOC Progression Note (Signed)
Transition of Care [facility]) - Progression Note    Patient Details  Name: Danson Vasicek MRN: [id number] Date of Birth: 1971-06-05  Transition of Care [facility]) CM/SW Contact  Beckie Busing, RN Phone [phone]  11/13/2022, 9:57 PM  Clinical Narrative:     Transition of Care (TOC) Screening Note   Patient Details  Name: Shawndell Mallernee Date of Birth: 12-12-1970   Transition of Care [facility]) CM/SW Contact:    Beckie Busing, RN Phone Number: 11/13/2022, 9:57 PM    Transition of Care Department [facility]) has reviewed patient and no TOC needs have been identified at this time. We will continue to monitor patient advancement through interdisciplinary progression rounds. If new patient transition needs arise, please place a TOC consult.          Expected Discharge Plan and Services                                               Social Determinants of Health (SDOH) Interventions SDOH Screenings   Food Insecurity: No Food Insecurity (06/13/2022)  Housing: Low Risk  (06/13/2022)  Transportation Needs: No Transportation Needs (06/13/2022)  Utilities: Not At Risk (06/13/2022)  Depression (PHQ2-9): Low Risk  (09/25/2022)  Tobacco Use: High Risk (11/12/2022)    Readmission Risk Interventions    06/16/2022    8:03 AM  Readmission Risk Prevention Plan  Post Dischage Appt Complete  Medication Screening Complete  Transportation Screening Complete

## 2022-11-14 ENCOUNTER — Other Ambulatory Visit: Payer: Self-pay

## 2022-11-14 DIAGNOSIS — R7989 Other specified abnormal findings of blood chemistry: Secondary | ICD-10-CM | POA: Diagnosis not present

## 2022-11-14 DIAGNOSIS — K863 Pseudocyst of pancreas: Secondary | ICD-10-CM | POA: Diagnosis not present

## 2022-11-14 DIAGNOSIS — E663 Overweight: Secondary | ICD-10-CM | POA: Diagnosis not present

## 2022-11-14 DIAGNOSIS — K859 Acute pancreatitis without necrosis or infection, unspecified: Secondary | ICD-10-CM | POA: Diagnosis not present

## 2022-11-14 LAB — GLUCOSE, CAPILLARY
Glucose-Capillary: 108 mg/dL — ABNORMAL HIGH (ref 70–99)
Glucose-Capillary: 128 mg/dL — ABNORMAL HIGH (ref 70–99)
Glucose-Capillary: 159 mg/dL — ABNORMAL HIGH (ref 70–99)
Glucose-Capillary: 165 mg/dL — ABNORMAL HIGH (ref 70–99)
Glucose-Capillary: 175 mg/dL — ABNORMAL HIGH (ref 70–99)
Glucose-Capillary: 182 mg/dL — ABNORMAL HIGH (ref 70–99)

## 2022-11-14 LAB — COMPREHENSIVE METABOLIC PANEL
ALT: 32 U/L (ref 0–44)
AST: 31 U/L (ref 15–41)
Albumin: 3.2 g/dL — ABNORMAL LOW (ref 3.5–5.0)
Alkaline Phosphatase: 170 U/L — ABNORMAL HIGH (ref 38–126)
Anion gap: 5 (ref 5–15)
BUN: 11 mg/dL (ref 6–20)
CO2: 30 mmol/L (ref 22–32)
Calcium: 8.1 mg/dL — ABNORMAL LOW (ref 8.9–10.3)
Chloride: 101 mmol/L (ref 98–111)
Creatinine, Ser: 0.86 mg/dL (ref 0.61–1.24)
GFR, Estimated: >60 mL/min (ref >60)
Glucose, Bld: 163 mg/dL — ABNORMAL HIGH (ref 70–99)
Potassium: 4.2 mmol/L (ref 3.5–5.1)
Sodium: 136 mmol/L (ref 135–145)
Total Bilirubin: 0.9 mg/dL (ref 0.3–1.2)
Total Protein: 5.7 g/dL — ABNORMAL LOW (ref 6.5–8.1)

## 2022-11-14 LAB — MAGNESIUM: Magnesium: 2 mg/dL (ref 1.7–2.4)

## 2022-11-14 LAB — PHOSPHORUS: Phosphorus: 4.1 mg/dL (ref 2.5–4.6)

## 2022-11-14 LAB — LIPASE, BLOOD: Lipase: 136 U/L — ABNORMAL HIGH (ref 11–51)

## 2022-11-14 MED ORDER — NALOXONE HCL 0.4 MG/ML IJ SOLN: 0.4000 mg | INTRAMUSCULAR | Status: DC | PRN

## 2022-11-14 MED ORDER — DIPHENHYDRAMINE HCL 50 MG/ML IJ SOLN: 12.5000 mg | Freq: Four times a day (QID) | INTRAMUSCULAR | Status: DC | PRN

## 2022-11-14 MED ORDER — DIPHENHYDRAMINE HCL 12.5 MG/5ML PO ELIX: 12.5000 mg | ORAL_SOLUTION | Freq: Four times a day (QID) | ORAL | Status: DC | PRN

## 2022-11-14 MED ORDER — SODIUM CHLORIDE 0.9% FLUSH
10.0000 mL | INTRAVENOUS | Status: DC | PRN
  Administered 2022-11-16: 10 mL

## 2022-11-14 MED ORDER — ONDANSETRON HCL 4 MG/2ML IJ SOLN: 4.0000 mg | Freq: Four times a day (QID) | INTRAMUSCULAR | Status: DC | PRN

## 2022-11-14 MED ORDER — SODIUM CHLORIDE 0.9% FLUSH: 9.0000 mL | INTRAVENOUS | Status: DC | PRN

## 2022-11-14 MED ORDER — SODIUM CHLORIDE 0.9 % IV SOLN: INTRAVENOUS | Status: DC

## 2022-11-14 MED ORDER — CHLORHEXIDINE GLUCONATE CLOTH 2 % EX PADS
6.0000 | MEDICATED_PAD | Freq: Every day | CUTANEOUS | Status: DC
  Administered 2022-11-14 – 2022-11-23 (×10): 6 via TOPICAL

## 2022-11-14 MED ORDER — INSULIN ASPART 100 UNIT/ML IJ SOLN
0.0000 [IU] | INTRAMUSCULAR | Status: DC
  Administered 2022-11-14: 2 [IU] via SUBCUTANEOUS
  Administered 2022-11-14: 3 [IU] via SUBCUTANEOUS
  Administered 2022-11-15 (×4): 2 [IU] via SUBCUTANEOUS
  Administered 2022-11-15: 3 [IU] via SUBCUTANEOUS
  Administered 2022-11-16: 2 [IU] via SUBCUTANEOUS
  Administered 2022-11-16 (×2): 3 [IU] via SUBCUTANEOUS
  Administered 2022-11-16: 2 [IU] via SUBCUTANEOUS
  Administered 2022-11-16: 3 [IU] via SUBCUTANEOUS
  Administered 2022-11-17: 5 [IU] via SUBCUTANEOUS
  Administered 2022-11-17 (×2): 3 [IU] via SUBCUTANEOUS

## 2022-11-14 MED ORDER — INSULIN ASPART 100 UNIT/ML IJ SOLN: 0.0000 [IU] | INTRAMUSCULAR | Status: DC

## 2022-11-14 MED ORDER — TRAVASOL 10 % IV SOLN
INTRAVENOUS | Status: AC
  Filled 2022-11-14: qty 528

## 2022-11-14 MED ORDER — HYDROMORPHONE 1 MG/ML IV SOLN
INTRAVENOUS | Status: DC
  Administered 2022-11-14: 30 mg via INTRAVENOUS
  Administered 2022-11-14: 1.7 mg via INTRAVENOUS
  Administered 2022-11-15: 2.2 mg via INTRAVENOUS
  Administered 2022-11-15: 0.4 mg via INTRAVENOUS
  Administered 2022-11-15: 2 mg via INTRAVENOUS
  Administered 2022-11-15: 1 mg via INTRAVENOUS
  Administered 2022-11-15: 0.6 mg via INTRAVENOUS
  Administered 2022-11-15: 0.4 mg via INTRAVENOUS
  Administered 2022-11-16: 1.6 mg via INTRAVENOUS
  Administered 2022-11-16: 2.8 mg via INTRAVENOUS
  Administered 2022-11-16: 0.6 mg via INTRAVENOUS
  Administered 2022-11-16: 0.4 mg via INTRAVENOUS
  Administered 2022-11-16: 2 mg via INTRAVENOUS
  Administered 2022-11-17: 1 mg via INTRAVENOUS
  Administered 2022-11-17: 30 mg via INTRAVENOUS
  Administered 2022-11-18: 1.2 mg via INTRAVENOUS
  Administered 2022-11-18: 2.2 mg via INTRAVENOUS
  Administered 2022-11-18: 1.4 mg via INTRAVENOUS
  Administered 2022-11-19: 0.6 mg via INTRAVENOUS
  Administered 2022-11-19 (×2): 0.8 mg via INTRAVENOUS
  Administered 2022-11-19: 0.2 mg via INTRAVENOUS
  Administered 2022-11-19: 1 mL via INTRAVENOUS
  Administered 2022-11-19: 0.6 mg via INTRAVENOUS
  Administered 2022-11-19: 1.6 mg via INTRAVENOUS
  Administered 2022-11-20: 1.4 mg via INTRAVENOUS
  Administered 2022-11-20: 30 mg via INTRAVENOUS
  Filled 2022-11-14 (×4): qty 30

## 2022-11-14 MED ORDER — SODIUM CHLORIDE 0.9% FLUSH
10.0000 mL | Freq: Two times a day (BID) | INTRAVENOUS | Status: DC
  Administered 2022-11-14 – 2022-11-22 (×12): 10 mL

## 2022-11-14 NOTE — Progress Notes (Signed)
Peripherally Inserted Central Catheter Placement  The IV Nurse has discussed with the patient and/or persons authorized to consent for the patient, the purpose of this procedure and the potential benefits and risks involved with this procedure.  The benefits include less needle sticks, lab draws from the catheter, and the patient may be discharged home with the catheter. Risks include, but not limited to, infection, bleeding, blood clot (thrombus formation), and puncture of an artery; nerve damage and irregular heartbeat and possibility to perform a PICC exchange if needed/ordered by physician.  Alternatives to this procedure were also discussed.  Bard Power PICC patient education guide, fact sheet on infection prevention and patient information card has been provided to patient /or left at bedside.    PICC Placement Documentation  PICC Double Lumen 11/14/22 Right Basilic 39 cm 0 cm (Active)  Indication for Insertion or Continuance of Line Administration of hyperosmolar/irritating solutions (i.e. TPN, Vancomycin, etc.) 11/14/22 1208  Exposed Catheter (cm) 0 cm 11/14/22 1208  Site Assessment Clean, Dry, Intact 11/14/22 1208  Lumen #1 Status Saline locked;Flushed;Blood return noted 11/14/22 1208  Lumen #2 Status Saline locked;Flushed;Blood return noted 11/14/22 1208  Dressing Type Transparent;Securing device 11/14/22 1208  Dressing Status Antimicrobial disc in place;Clean, Dry, Intact 11/14/22 1208  Safety Lock Not Applicable 11/14/22 1208  Line Care Connections checked and tightened 11/14/22 1208  Line Adjustment (NICU/IV Team Only) No 11/14/22 1208  Dressing Intervention New dressing 11/14/22 1208  Dressing Change Due 11/21/22 11/14/22 1208       [name] 11/14/2022, 12:09 PM

## 2022-11-14 NOTE — Progress Notes (Signed)
Nutrition Follow-up  INTERVENTION:   Monitor magnesium, potassium, and phosphorus for at least 3 days, MD to replete as needed, as pt is at risk for refeeding syndrome.  -TPN management per Pharmacy following failed NJT attempt -Recommend 100 mg Thiamine daily  x 5 days -Daily weights while on TPN  NUTRITION DIAGNOSIS:   Inadequate oral intake related to nausea, vomiting, chronic illness as evidenced by per patient/family report.  Ongoing.  GOAL:   Patient will meet greater than or equal to 90% of their needs  Not meeting yet.  MONITOR:   PO intake, Labs, Weight trends, I & O's  REASON FOR ASSESSMENT:   Consult New TPN/TNA  ASSESSMENT:   52 y.o. male with medical history significant for insulin-dependent type 2 diabetes, bipolar disorder, chronic pancreatitis with known pancreatic pseudocyst being admitted to the hospital with recurrent severe abdominal pain and concern for recurrent pancreatitis.  The patient has a complicated history of chronic pancreatitis and known pancreatic pseudocyst after complications from an elective laparoscopic cholecystectomy in April 2022.  This was followed by severe necrotizing pancreatitis after ERCP.  He subsequently developed a biliary stricture for which he had a metal CBD stent and balloon sphincteroplasty in November 2023, and recurrent pseudocyst in the body of his pancreas, which has been drained endoscopically multiple times with gastroenterology.  NJT unable to be placed yesterday per charge nurse. Pt refusing further attempts at this time. Plan will be for patient to start TPN following PICC placement today.  Did note pt consumed 100% of dinner last night. Still with pain. Per surgery note, possible surgery next week as well.  Will continue to monitor plan.   TPN to begin at 40 ml/hr tonight. Providing ~1021 kcals and 52g protein.  Admission weight: 208 lbs. No other weights since 5/9. Daily weights recommended with  TPN.  Medications: Colace, Insulin   Labs reviewed: CBGs: 98-184  Diet Order:   Diet Order             Diet heart healthy/carb modified Room service appropriate? Yes; Fluid consistency: Thin  Diet effective now                   EDUCATION NEEDS:   Education needs have been addressed  Skin:  Skin Assessment: Reviewed RN Assessment  Last BM:  5/10 -type 7  Height:   Ht Readings from Last 1 Encounters:  11/12/22 5\' 10"  (1.778 m)    Weight:   Wt Readings from Last 1 Encounters:  11/12/22 94.3 kg    BMI:  Body mass index is 29.84 kg/m.  Estimated Nutritional Needs:   Kcal:  2200-2400  Protein:  110-125g  Fluid:  2.2L/day   Tilda Franco, MS, RD, LDN Inpatient Clinical Dietitian Contact information available via Amion

## 2022-11-14 NOTE — Progress Notes (Signed)
PHARMACY - TOTAL PARENTERAL NUTRITION CONSULT NOTE   Indication: Severe pancreatitis   Patient Measurements: Height: 5\' 10"  (177.8 cm) Weight: 94.3 kg (208 lb) IBW/kg (Calculated) : 73 TPN AdjBW (KG): 78.3 Body mass index is 29.84 kg/m. Usual Weight:   Assessment: [name] with a history of post-ERCP necrotizing pancreatitis.  CT scan shows enlargement of the pancreatic pseudocyst.  Early satiety and vomiting are presumably due to gastric outlet obstruction secondary to mass effect of the cyst.  Attempted placement of small bore NJT on 5/10, but was positive for CO2 (placed in lung) and removed, patient refused further attempt.  Pharmacy has been consulted to dose TPN.    Glucose / Insulin: Hx DM on home meds Toujeo 5 units BID.  Most recent A1c (07/21/22) 6.8 - Inpatient meds: Semglee 5 units QHS (last given on 5/10), sSSI.  Upon start of TPN, will d/c Semglee and increase to moderate SSI q4h.   - CBGs:  98-184 Electrolytes: WNL.  Corr Ca 8.74 Renal: SCr < 1, BUN wnl Hepatic: LFTs and Tbili wnl, lipase 136, alk phos 170, Trig pending Intake / Output; MIVF: mIVF NS @ 150 ml/hr GI Imaging: 5/9 CT: enlarging fluid collection c/w pancreatic psuedocyst  GI Surgeries / Procedures:   Central access: PICC placement 5/11 TPN start date: 5/11   Nutritional Goals: Goal TPN rate is 90 mL/hr provides 119 g of protein and 2298 kcals per day.   RD Assessment: Estimated Needs Total Energy Estimated Needs: 2200-2400 Total Protein Estimated Needs: 110-125g Total Fluid Estimated Needs: 2.2L/day  Current Nutrition:  5/9 heart healthy/carb modified diet - minimal intake per RN.  Pt reports vomiting.    5/10 unsuccessful attempt to place small bore feeding tube  Plan:  Start TPN at 40 mL/hr at 1800 - Low initial rate and slow titration due to high risk for refeeding.  Goal starting rate 100 - 150g dextrose per day, and 10-20 kcal/kg/day in the first 24 hrs. ASPEN recommends advancement of 33% of  goal every one to two days (generally 50-100g dextrose per day)  Electrolytes in TPN: Na 58mEq/L, K 12mEq/L, Ca 80mEq/L, Mg 58mEq/L, and Phos 42mmol/L. Cl:Ac 1:1 Add standard MVI and trace elements to TPN Thiamine 100mg  IV in TPN x5 days (5/11-5/15) Initiate Moderate SSI q4h and adjust as needed. Monitor need to add insulin to TPN based on SSI usage.   Reduce MIVF to 110 mL/hr at 1800 Monitor TPN labs on Mon/Thurs, and PRN  Per RD notes, recommend enteral feeding to maintain gut integrity and other benefits.   Lynann Beaver PharmD, BCPS [facility] main pharmacy [phone] 11/14/2022 10:30 AM

## 2022-11-14 NOTE — Plan of Care (Signed)

## 2022-11-14 NOTE — Progress Notes (Signed)
[facility] Progress Note  Patient: Steve Romero    ZOX:[id number]  DOA: 11/12/2022    Date of Service: the patient was seen and examined on 11/14/2022  Brief hospital course: 52 year old male with past medical history of diabetes mellitus type 2 on insulin, bipolar disorder and chronic pancreatitis with known pancreatic pseudocyst after complications from elective laparoscopic cholecystectomy in April 2022.  He had subsequent issues with severe necrotizing pancreatitis after ERCP and then subsequently developed a biliary stricture for which she had a metal CBD stent and balloon sphincterotomy in November 2023.  Patient has had recurrent pseudocyst which has been drained endoscopically multiple times by GI.  Patient continues to have chronic abdominal pain and was seen in consultation by Dr. Freida Busman of hepatobiliary surgery who recommended continued observation.  Patient continues to have abdominal pain, but presented to the hospital on the morning of 5/9 with worsening abdominal pain with associated nausea times several days.  Lab work noteworthy for glucose of 343, alkaline phosphatase of 235 and lipase of 152.  CT scan noted evidence of significantly large pancreatic pseudocyst up to 6.3 cm.  Gastroenterology consulted, who in turn have also consulted hepatobiliary surgery.   Assessment and Plan: Acute on chronic pancreatitis with recurrent pseudocyst: N.p.o. with IV fluids and pain and nausea control.  Appreciate GI help.  Patient seen by hepatobiliary surgery who feel a surgical cystogastrostomy may be patient's best option and better outcome than partial pancreatectomy.  Transfer patient to [facility] for surgery next week.  Starting TPN in the interim, PICC line to be placed.  Diabetes mellitus: N.p.o., every 4 hours sliding scale  COPD: Stable  Overweight: Meets criteria for BMI greater than 25   Body mass index is 29.84 kg/m.  Nutrition Problem: Inadequate oral intake Etiology:  nausea, vomiting, chronic illness     Consultants: Hepatobiliary surgery Gastroenterology  Procedures: None  Antimicrobials: None  Code Status: Full code   Subjective: Still with some pain although controlled.  Objective: Vital signs were reviewed and unremarkable. Vitals:   11/13/22 2051 11/14/22 0427  BP: 133/79 123/84  Pulse: (!) 56 63  Resp: 14 14  Temp: (!) 97.5 F (36.4 C) (!) 97.5 F (36.4 C)  SpO2: 100% 96%    Intake/Output Summary (Last 24 hours) at 11/14/2022 1124 Last data filed at 11/14/2022 0545 Gross per 24 hour  Intake 3095.3 ml  Output --  Net 3095.3 ml    Filed Weights   11/12/22 0812  Weight: 94.3 kg   Body mass index is 29.84 kg/m.  Exam:  General: Alert and oriented x 3, no acute distress HEENT: Normocephalic atraumatic, mucous membranes are slightly dry Cardiovascular: Regular rate and rhythm, S1-S2 Respiratory: Clear to auscultation bilaterally, Abdomen: Soft, minimal tenderness especially in the upper quadrants, nondistended, hypoactive bowel sounds Musculoskeletal: No clubbing or cyanosis or edema Skin: No skin breaks, tears or lesions Psychiatry: Appropriate, no evidence of psychoses Neurology: No focal deficits  Data Reviewed: Lipase is at 136.  Disposition:  Status is: Inpatient Remains inpatient appropriate because:  -Improvement in pancreatitis -Decision on cystogastrostomy -Transfer to [facility] -Placement of PICC line and initiation of TPN    Anticipated discharge date: Unknown  Family Communication: Will call sister DVT Prophylaxis: enoxaparin (LOVENOX) injection 40 mg Start: 11/12/22 2200 SCDs Start: 11/12/22 1332    Author: Hollice Espy ,MD 11/14/2022 11:24 AM  To reach On-call, see care teams to locate the attending and reach out via [email or web address]. Between 7PM-7AM, please contact night-coverage If  you still have difficulty reaching the attending provider, please page the [facility] (Director on Call)  for [facility] on amion for assistance.

## 2022-11-14 NOTE — Progress Notes (Signed)
Subjective/Chief Complaint: Complains of some belching   Objective: Vital signs in last 24 hours: Temp:  [97.5 F (36.4 C)-98.3 F (36.8 C)] 97.5 F (36.4 C) (05/11 0427) Pulse Rate:  [50-63] 63 (05/11 0427) Resp:  [14-20] 14 (05/11 0427) BP: (123-133)/(79-92) 123/84 (05/11 0427) SpO2:  [96 %-100 %] 96 % (05/11 0427) Last BM Date : 11/13/22  Intake/Output from previous day: 05/10 0701 - 05/11 0700 In: 5939 [P.O.:600; I.V.:5339] Out: -  Intake/Output this shift: No intake/output data recorded.  General appearance: alert and cooperative Resp: clear to auscultation bilaterally Cardio: regular rate and rhythm GI: soft, nontender  Lab Results:  Recent Labs    11/12/22 0848 11/13/22 0627  WBC 10.2 8.9  HGB 18.3* 15.6  HCT 51.3 44.1  PLT 295 246   BMET Recent Labs    11/13/22 0627 11/14/22 0633  NA 135 136  K 4.0 4.2  CL 98 101  CO2 29 30  GLUCOSE 182* 163*  BUN 17 11  CREATININE 0.87 0.86  CALCIUM 8.3* 8.1*   PT/INR Recent Labs    11/13/22 1150  LABPROT 12.7  INR 0.9   ABG No results for input(s): "PHART", "HCO3" in the last 72 hours.  Invalid input(s): "PCO2", "PO2"  Studies/Results: [place] EKG SITE RITE  Result Date: 11/14/2022 If Site Rite image not attached, placement could not be confirmed due to current cardiac rhythm.  DG CHEST PORT 1 VIEW  Result Date: 11/13/2022 CLINICAL DATA:  Recent gastric catheter placement EXAM: PORTABLE CHEST 1 VIEW COMPARISON:  12/20/2020 FINDINGS: Cardiac shadow is mildly prominent but accentuated by the portable technique. The lungs are well aerated bilaterally. No focal infiltrate or effusion is seen. No pneumothorax is noted. No gastric catheter is seen. The bony structures are within normal limits. Chronic blunting of left costophrenic angle is seen. IMPRESSION: No acute abnormality noted. Electronically Signed   By: Alcide Clever M.D.   On: 11/13/2022 17:55   CT ABDOMEN PELVIS W CONTRAST  Result Date:  11/12/2022 CLINICAL DATA:  Upper abdominal pain. EXAM: CT ABDOMEN AND PELVIS WITH CONTRAST TECHNIQUE: Multidetector CT imaging of the abdomen and pelvis was performed using the standard protocol following bolus administration of intravenous contrast. RADIATION DOSE REDUCTION: This exam was performed according to the departmental dose-optimization program which includes automated exposure control, adjustment of the mA and/or kV according to patient size and/or use of iterative reconstruction technique. CONTRAST:  OMNIPAQUE IOHEXOL 300 MG/ML  SOLN COMPARISON:  October 28, 2022. FINDINGS: Lower chest: No acute abnormality. Hepatobiliary: Status post cholecystectomy. Stable dilatation of common bile duct is noted which may be due to post cholecystectomy status or potentially due to distal common bile duct obstruction secondary to pancreatic inflammation. Stable complex low density is seen involving the caudate lobe as well as lateral segmental left hepatic lobe in inferior portion of medial segment of left hepatic lobe. Pancreas: 6.3 x 4.8 cm fluid collection is noted between the distal stomach and pancreas which is significantly enlarged compared to prior exam and most consistent with pancreatic pseudocyst. Mild inflammatory changes are seen involving the pancreatic body suggesting pancreatitis. Mild pancreatic ductal dilatation is noted in the tail. Spleen: Normal in size without focal abnormality. Adrenals/Urinary Tract: Adrenal glands are unremarkable. Kidneys are normal, without renal calculi, focal lesion, or hydronephrosis. Bladder is unremarkable. Stomach/Bowel: Stomach is within normal limits. Appendix appears normal. No evidence of bowel wall thickening, distention, or inflammatory changes. Vascular/Lymphatic: No significant vascular findings are present. No enlarged abdominal or pelvic  lymph nodes. Reproductive: Prostate is unremarkable. Other: No abdominal wall hernia or abnormality. No abdominopelvic  ascites. Musculoskeletal: No acute or significant osseous findings. IMPRESSION: 6.3 x 4.8 cm fluid collection is noted between the distal stomach and pancreas which is significantly enlarged compared to prior exam and most consistent with enlarging pancreatic pseudocyst. Mild inflammatory changes are noted involving the pancreatic body suggesting pancreatitis. Mild pancreatic ductal dilatation is noted in the tail which was present on prior exam. There is continued dilatation of the common bile duct which may be due to post cholecystectomy status, but potentially may be due to obstruction of the distal common bile duct secondary to the pseudo cyst and pancreatic inflammation. Stable complex low density is seen involving the left hepatic lobe and caudate lobe as noted on prior exams. It is uncertain if this represents focal inflammation or hepatic infarction as noted on prior exams. Electronically Signed   By: Lupita Raider M.D.   On: 11/12/2022 12:09    Anti-infectives: Anti-infectives (From admission, onward)    None       Assessment/Plan: s/p * No surgery found * Recurrent pancreatic pseudocyst causing GOO Post pyloric tube unsuccessful Will ask for PICC today and start TPN Dr. Freida Busman considering surgery next week  LOS: 2 days    Steve Romero 11/14/2022

## 2022-11-15 DIAGNOSIS — K859 Acute pancreatitis without necrosis or infection, unspecified: Secondary | ICD-10-CM | POA: Diagnosis not present

## 2022-11-15 DIAGNOSIS — E663 Overweight: Secondary | ICD-10-CM | POA: Diagnosis not present

## 2022-11-15 DIAGNOSIS — K863 Pseudocyst of pancreas: Secondary | ICD-10-CM | POA: Diagnosis not present

## 2022-11-15 DIAGNOSIS — R7989 Other specified abnormal findings of blood chemistry: Secondary | ICD-10-CM | POA: Diagnosis not present

## 2022-11-15 LAB — BASIC METABOLIC PANEL
Anion gap: 9 (ref 5–15)
BUN: 9 mg/dL (ref 6–20)
CO2: 25 mmol/L (ref 22–32)
Calcium: 8.4 mg/dL — ABNORMAL LOW (ref 8.9–10.3)
Chloride: 104 mmol/L (ref 98–111)
Creatinine, Ser: 0.73 mg/dL (ref 0.61–1.24)
GFR, Estimated: >60 mL/min (ref >60)
Glucose, Bld: 109 mg/dL — ABNORMAL HIGH (ref 70–99)
Potassium: 3.7 mmol/L (ref 3.5–5.1)
Sodium: 138 mmol/L (ref 135–145)

## 2022-11-15 LAB — GLUCOSE, CAPILLARY
Glucose-Capillary: 131 mg/dL — ABNORMAL HIGH (ref 70–99)
Glucose-Capillary: 136 mg/dL — ABNORMAL HIGH (ref 70–99)
Glucose-Capillary: 143 mg/dL — ABNORMAL HIGH (ref 70–99)
Glucose-Capillary: 127 mg/dL — ABNORMAL HIGH (ref 70–99)
Glucose-Capillary: 97 mg/dL (ref 70–99)

## 2022-11-15 LAB — MAGNESIUM: Magnesium: 1.9 mg/dL (ref 1.7–2.4)

## 2022-11-15 LAB — TRIGLYCERIDES: Triglycerides: 109 mg/dL (ref <150)

## 2022-11-15 LAB — PHOSPHORUS: Phosphorus: 3.9 mg/dL (ref 2.5–4.6)

## 2022-11-15 LAB — LIPASE, BLOOD: Lipase: 103 U/L — ABNORMAL HIGH (ref 11–51)

## 2022-11-15 MED ORDER — TRAVASOL 10 % IV SOLN
INTRAVENOUS | Status: AC
  Filled 2022-11-15: qty 792

## 2022-11-15 MED ORDER — SODIUM CHLORIDE 0.9 % IV SOLN: INTRAVENOUS | Status: DC

## 2022-11-15 MED ORDER — POTASSIUM CHLORIDE IN NACL 20-0.9 MEQ/L-% IV SOLN: INTRAVENOUS | Status: DC

## 2022-11-15 NOTE — Progress Notes (Signed)
PHARMACY - TOTAL PARENTERAL NUTRITION CONSULT NOTE   Indication: Severe pancreatitis   Patient Measurements: Height: 5\' 10"  (177.8 cm) Weight: 94.3 kg (208 lb) IBW/kg (Calculated) : 73 TPN AdjBW (KG): 78.3 Body mass index is 29.84 kg/m. Usual Weight:   Assessment: 6 yoM with a history of post-ERCP necrotizing pancreatitis.  CT scan shows enlargement of the pancreatic pseudocyst.  Early satiety and vomiting are presumably due to gastric outlet obstruction secondary to mass effect of the cyst.  Attempted placement of small bore NJT on 5/10, but was positive for CO2 (placed in lung) and removed, patient refused further attempt.  Pharmacy has been consulted to dose TPN.    Glucose / Insulin: Hx DM; most recent A1c (07/21/22) 6.8 - Home insulin: Toujeo 5 units BID.  Inpatient started on Semglee 5 units QHS (last given on 5/10, then d/c) - mSSI:  13 units / 24 hrs - CBGs: 108-182 Electrolytes: WNL Renal: SCr < 1, BUN wnl Hepatic: LFTs and Tbili wnl, lipase down to 103, alk phos 170, Trig 109  Intake / Output; MIVF: mIVF NS @ 110 ml/hr - Output: urine x7 unmeasured occurrences yesterday.  LBM 5/10.  GI Imaging: 5/9 CT: enlarging fluid collection c/w pancreatic psuedocyst  GI Surgeries / Procedures: 5/13  Central access: PICC placement 5/11 TPN start date: 5/11   Nutritional Goals: Goal TPN rate is 90 mL/hr provides 119 g of protein and 2298 kcals per day.   RD Assessment: Estimated Needs Total Energy Estimated Needs: 2200-2400 Total Protein Estimated Needs: 110-125g Total Fluid Estimated Needs: 2.2L/day  Current Nutrition:  NPO 5/10 unsuccessful attempt to place small bore feeding tube  Plan:  Advance TPN to 60 mL/hr at 1800 - Low initial rate and slow titration due to high risk for refeeding.  Goal starting rate 100 - 150g dextrose per day, and 10-20 kcal/kg/day in the first 24 hrs. ASPEN recommends advancement of 33% of goal every one to two days (generally 50-100g dextrose  per day)  Electrolytes in TPN: Na 28mEq/L, K 62mEq/L, Ca 74mEq/L, Mg 30mEq/L, and Phos 72mmol/L. Cl:Ac 1:1 Add standard MVI and trace elements to TPN Thiamine 100mg  IV in TPN x5 days (5/11-5/15) Initiate Moderate SSI q4h and adjust as needed. Monitor need to add insulin to TPN based on SSI usage.   Reduce MIVF to 90 mL/hr at 1800 for total rate 150 ml/hr. Monitor TPN labs on Mon/Thurs, and PRN  Lynann Beaver PharmD, BCPS [facility] main pharmacy [phone] 11/15/2022 8:09 AM

## 2022-11-15 NOTE — Progress Notes (Signed)
[facility] Progress Note  Patient: Steve Romero    OZH:[id number]  DOA: 11/12/2022    Date of Service: the patient was seen and examined on 11/15/2022  Brief hospital course: 52 year old male with past medical history of diabetes mellitus type 2 on insulin, bipolar disorder and chronic pancreatitis with known pancreatic pseudocyst after complications from elective laparoscopic cholecystectomy in April 2022.  He had subsequent issues with severe necrotizing pancreatitis after ERCP and then subsequently developed a biliary stricture for which she had a metal CBD stent and balloon sphincterotomy in November 2023.  Patient has had recurrent pseudocyst which has been drained endoscopically multiple times by GI.  Patient continues to have chronic abdominal pain and was seen in consultation by Dr. Freida Busman of hepatobiliary surgery who recommended continued observation.  Patient continues to have abdominal pain, but presented to the hospital on the morning of 5/9 with worsening abdominal pain with associated nausea times several days.  Lab work noteworthy for glucose of 343, alkaline phosphatase of 235 and lipase of 152.  CT scan noted evidence of significantly large pancreatic pseudocyst up to 6.3 cm.  Gastroenterology consulted, who in turn have also consulted hepatobiliary surgery.  Dr. Freida Busman of hepatobiliary surgery who has been following with the patient in the office, plans to to do a cystogastrostomy at [facility] on 5/13.  Patient being transferred from [facility] 5/12.   Assessment and Plan: Acute on chronic pancreatitis with recurrent pseudocyst: N.p.o. with IV fluids and pain and nausea control.  Appreciate GI help.  Patient seen by hepatobiliary surgery who feel a surgical cystogastrostomy may be patient's best option and better outcome than partial pancreatectomy.  Transfer patient to [facility] for surgery 5/13.  PICC line placed and patient started on TPN.  Pain better controlled with  PCA  Diabetes mellitus: N.p.o., every 4 hours sliding scale  COPD: Stable  Overweight: Meets criteria for BMI greater than 25   Body mass index is 29.84 kg/m.  Nutrition Problem: Inadequate oral intake Etiology: nausea, vomiting, chronic illness     Consultants: Hepatobiliary surgery Gastroenterology  Procedures: Plan cystogastrostomy 5/13  Antimicrobials: None  Code Status: Full code   Subjective: Pain better controlled  Objective: Vital signs were reviewed and unremarkable. Vitals:   11/15/22 0442 11/15/22 0733  BP:    Pulse:    Resp: 16 14  Temp:    SpO2: 92% 96%    Intake/Output Summary (Last 24 hours) at 11/15/2022 0922 Last data filed at 11/15/2022 0549 Gross per 24 hour  Intake 2203.96 ml  Output --  Net 2203.96 ml    Filed Weights   11/12/22 0812  Weight: 94.3 kg   Body mass index is 29.84 kg/m.  Exam:  General: Alert and oriented x 3, no acute distress HEENT: Normocephalic atraumatic, mucous membranes are slightly dry Cardiovascular: Regular rate and rhythm, S1-S2 Respiratory: Clear to auscultation bilaterally, Abdomen: Soft, minimal tenderness especially in the upper quadrants, nondistended, hypoactive bowel sounds Musculoskeletal: No clubbing or cyanosis or edema Skin: No skin breaks, tears or lesions Psychiatry: Appropriate, no evidence of psychoses Neurology: No focal deficits  Data Reviewed: Lipase down to 103  Disposition:  Status is: Inpatient Remains inpatient appropriate because:  -Transfer to [facility] for cystogastrostomy -Placement of PICC line and initiation of TPN    Anticipated discharge date: Unknown  Family Communication: Will call sister DVT Prophylaxis: enoxaparin (LOVENOX) injection 40 mg Start: 11/12/22 2200 SCDs Start: 11/12/22 1332    Author: Hollice Espy ,MD 11/15/2022  9:22 AM  To reach On-call, see care teams to locate the attending and reach out via [email or web address]. Between 7PM-7AM, please  contact night-coverage If you still have difficulty reaching the attending provider, please page the [facility] (Director on Call) for [facility] on amion for assistance.

## 2022-11-15 NOTE — Progress Notes (Signed)
   Subjective/Chief Complaint: No complaints   Objective: Vital signs in last 24 hours: Temp:  [97.5 F (36.4 C)-98 F (36.7 C)] 98 F (36.7 C) (05/12 0420) Pulse Rate:  [55-60] 60 (05/12 0420) Resp:  [14-20] 14 (05/12 0733) BP: (120-130)/(82-86) 130/83 (05/12 0420) SpO2:  [92 %-96 %] 96 % (05/12 0733) FiO2 (%):  [0 %] 0 % (05/11 2035) Last BM Date : 11/14/22  Intake/Output from previous day: 05/11 0701 - 05/12 0700 In: 2204 [P.O.:720; I.V.:1484] Out: -  Intake/Output this shift: No intake/output data recorded.  General appearance: alert and cooperative Resp: clear to auscultation bilaterally Cardio: regular rate and rhythm GI: soft, nontender  Lab Results:  Recent Labs    11/12/22 0848 11/13/22 0627  WBC 10.2 8.9  HGB 18.3* 15.6  HCT 51.3 44.1  PLT 295 246   BMET Recent Labs    11/14/22 0633 11/15/22 0538  NA 136 138  K 4.2 3.7  CL 101 104  CO2 30 25  GLUCOSE 163* 109*  BUN 11 9  CREATININE 0.86 0.73  CALCIUM 8.1* 8.4*   PT/INR Recent Labs    11/13/22 1150  LABPROT 12.7  INR 0.9   ABG No results for input(s): "PHART", "HCO3" in the last 72 hours.  Invalid input(s): "PCO2", "PO2"  Studies/Results: [place] EKG SITE RITE  Result Date: 11/14/2022 If Site Rite image not attached, placement could not be confirmed due to current cardiac rhythm.  DG CHEST PORT 1 VIEW  Result Date: 11/13/2022 CLINICAL DATA:  Recent gastric catheter placement EXAM: PORTABLE CHEST 1 VIEW COMPARISON:  12/20/2020 FINDINGS: Cardiac shadow is mildly prominent but accentuated by the portable technique. The lungs are well aerated bilaterally. No focal infiltrate or effusion is seen. No pneumothorax is noted. No gastric catheter is seen. The bony structures are within normal limits. Chronic blunting of left costophrenic angle is seen. IMPRESSION: No acute abnormality noted. Electronically Signed   By: Alcide Clever M.D.   On: 11/13/2022 17:55    Anti-infectives: Anti-infectives  (From admission, onward)    None       Assessment/Plan: s/p * No surgery found * Continue bowel rest [facility] in [facility] of surgery tomorrow with Dr. Freida Busman  LOS: 3 days    Chevis Pretty III 11/15/2022

## 2022-11-16 DIAGNOSIS — J449 Chronic obstructive pulmonary disease, unspecified: Secondary | ICD-10-CM

## 2022-11-16 DIAGNOSIS — K859 Acute pancreatitis without necrosis or infection, unspecified: Secondary | ICD-10-CM

## 2022-11-16 DIAGNOSIS — Z794 Long term (current) use of insulin: Secondary | ICD-10-CM

## 2022-11-16 DIAGNOSIS — E663 Overweight: Secondary | ICD-10-CM

## 2022-11-16 DIAGNOSIS — K861 Other chronic pancreatitis: Secondary | ICD-10-CM

## 2022-11-16 DIAGNOSIS — K863 Pseudocyst of pancreas: Secondary | ICD-10-CM | POA: Diagnosis not present

## 2022-11-16 DIAGNOSIS — E119 Type 2 diabetes mellitus without complications: Secondary | ICD-10-CM

## 2022-11-16 LAB — COMPREHENSIVE METABOLIC PANEL
ALT: 56 U/L — ABNORMAL HIGH (ref 0–44)
AST: 33 U/L (ref 15–41)
Albumin: 3.3 g/dL — ABNORMAL LOW (ref 3.5–5.0)
Alkaline Phosphatase: 222 U/L — ABNORMAL HIGH (ref 38–126)
Anion gap: 6 (ref 5–15)
BUN: 11 mg/dL (ref 6–20)
CO2: 26 mmol/L (ref 22–32)
Calcium: 8.1 mg/dL — ABNORMAL LOW (ref 8.9–10.3)
Chloride: 102 mmol/L (ref 98–111)
Creatinine, Ser: 0.73 mg/dL (ref 0.61–1.24)
GFR, Estimated: >60 mL/min (ref >60)
Glucose, Bld: 147 mg/dL — ABNORMAL HIGH (ref 70–99)
Potassium: 3.6 mmol/L (ref 3.5–5.1)
Sodium: 134 mmol/L — ABNORMAL LOW (ref 135–145)
Total Bilirubin: 0.9 mg/dL (ref 0.3–1.2)
Total Protein: 5.9 g/dL — ABNORMAL LOW (ref 6.5–8.1)

## 2022-11-16 LAB — GLUCOSE, CAPILLARY
Glucose-Capillary: 104 mg/dL — ABNORMAL HIGH (ref 70–99)
Glucose-Capillary: 126 mg/dL — ABNORMAL HIGH (ref 70–99)
Glucose-Capillary: 137 mg/dL — ABNORMAL HIGH (ref 70–99)
Glucose-Capillary: 140 mg/dL — ABNORMAL HIGH (ref 70–99)
Glucose-Capillary: 157 mg/dL — ABNORMAL HIGH (ref 70–99)
Glucose-Capillary: 164 mg/dL — ABNORMAL HIGH (ref 70–99)
Glucose-Capillary: 183 mg/dL — ABNORMAL HIGH (ref 70–99)

## 2022-11-16 LAB — CBC
HCT: 39.8 % (ref 39.0–52.0)
Hemoglobin: 13.6 g/dL (ref 13.0–17.0)
MCH: 30.2 pg (ref 26.0–34.0)
MCHC: 34.2 g/dL (ref 30.0–36.0)
MCV: 88.4 fL (ref 80.0–100.0)
Platelets: 218 10*3/uL (ref 150–400)
RBC: 4.5 MIL/uL (ref 4.22–5.81)
RDW: 12.2 % (ref 11.5–15.5)
WBC: 6.7 10*3/uL (ref 4.0–10.5)
nRBC: 0 % (ref 0.0–0.2)

## 2022-11-16 LAB — LIPASE, BLOOD: Lipase: 108 U/L — ABNORMAL HIGH (ref 11–51)

## 2022-11-16 LAB — MAGNESIUM: Magnesium: 1.9 mg/dL (ref 1.7–2.4)

## 2022-11-16 LAB — PHOSPHORUS: Phosphorus: 4.1 mg/dL (ref 2.5–4.6)

## 2022-11-16 LAB — ABO/RH: ABO/RH(D): B NEG

## 2022-11-16 LAB — TRIGLYCERIDES: Triglycerides: 99 mg/dL (ref <150)

## 2022-11-16 MED ORDER — TIOTROPIUM BROMIDE MONOHYDRATE 18 MCG IN CAPS: 18.0000 ug | ORAL_CAPSULE | Freq: Every day | RESPIRATORY_TRACT | Status: DC

## 2022-11-16 MED ORDER — TRAVASOL 10 % IV SOLN
INTRAVENOUS | Status: AC
  Filled 2022-11-16: qty 1188

## 2022-11-16 MED ORDER — IPRATROPIUM-ALBUTEROL 0.5-2.5 (3) MG/3ML IN SOLN: 3.0000 mL | Freq: Two times a day (BID) | RESPIRATORY_TRACT | Status: DC

## 2022-11-16 MED ORDER — MOMETASONE FURO-FORMOTEROL FUM 200-5 MCG/ACT IN AERO
2.0000 | INHALATION_SPRAY | Freq: Two times a day (BID) | RESPIRATORY_TRACT | Status: DC
  Administered 2022-11-18 – 2022-11-23 (×10): 2 via RESPIRATORY_TRACT
  Filled 2022-11-16: qty 8.8

## 2022-11-16 MED ORDER — UMECLIDINIUM BROMIDE 62.5 MCG/ACT IN AEPB
1.0000 | INHALATION_SPRAY | Freq: Every day | RESPIRATORY_TRACT | Status: DC
  Administered 2022-11-18 – 2022-11-23 (×6): 1 via RESPIRATORY_TRACT
  Filled 2022-11-16: qty 7

## 2022-11-16 MED ORDER — CHLORHEXIDINE GLUCONATE CLOTH 2 % EX PADS
6.0000 | MEDICATED_PAD | Freq: Once | CUTANEOUS | Status: AC
  Administered 2022-11-16: 6 via TOPICAL

## 2022-11-16 MED ORDER — IPRATROPIUM-ALBUTEROL 0.5-2.5 (3) MG/3ML IN SOLN
3.0000 mL | Freq: Two times a day (BID) | RESPIRATORY_TRACT | Status: DC
  Filled 2022-11-16: qty 3

## 2022-11-16 MED ORDER — POTASSIUM CHLORIDE 10 MEQ/100ML IV SOLN
10.0000 meq | INTRAVENOUS | Status: AC
  Filled 2022-11-16: qty 100

## 2022-11-16 MED ORDER — POTASSIUM CHLORIDE CRYS ER 20 MEQ PO TBCR
20.0000 meq | EXTENDED_RELEASE_TABLET | Freq: Once | ORAL | Status: AC
  Administered 2022-11-16: 20 meq via ORAL
  Filled 2022-11-16: qty 1

## 2022-11-16 MED ORDER — THIAMINE HCL 100 MG/ML IJ SOLN
100.0000 mg | Freq: Every day | INTRAMUSCULAR | Status: AC
  Administered 2022-11-17 – 2022-11-18 (×2): 100 mg via INTRAVENOUS
  Filled 2022-11-16 (×2): qty 2

## 2022-11-16 NOTE — Progress Notes (Signed)
PROGRESS NOTE    Steve Romero  WGN:[id number] DOB: [birth year] DOA: 11/12/2022 PCP: Raliegh Ip, DO    Chief Complaint  Patient presents with   Abdominal Pain    Brief Narrative:  52 year old male with past medical history of diabetes mellitus type 2 on insulin, bipolar disorder and chronic pancreatitis with known pancreatic pseudocyst after complications from elective laparoscopic cholecystectomy in April 2022.  He had subsequent issues with severe necrotizing pancreatitis after ERCP and then subsequently developed a biliary stricture for which she had a metal CBD stent and balloon sphincterotomy in November 2023.  Patient has had recurrent pseudocyst which has been drained endoscopically multiple times by GI.  Patient continues to have chronic abdominal pain and was seen in consultation by Dr. Freida Busman of hepatobiliary surgery who recommended continued observation.  Patient continues to have abdominal pain, but presented to the hospital on the morning of 5/9 with worsening abdominal pain with associated nausea times several days.  Lab work noteworthy for glucose of 343, alkaline phosphatase of 235 and lipase of 152.  CT scan noted evidence of significantly large pancreatic pseudocyst up to 6.3 cm.  Gastroenterology consulted, who in turn have also consulted hepatobiliary surgery.  Dr. Freida Busman of hepatobiliary surgery who has been following with the patient in the office, plans to to do a cystogastrostomy at [facility] on 5/13.  Patient transferred from [facility] 5/13.  Patient reassessed by general surgery who has discussed options with patient and patient scheduled for distal pancreatectomy tomorrow 11/17/2022 per general surgery.  Assessment & Plan:   Principal Problem:   Pancreatic pseudocyst Active Problems:   Acute on chronic pancreatitis (HCC)   COPD (chronic obstructive pulmonary disease) (HCC)   Abnormal LFTs   Abdominal pain, chronic, epigastric   Controlled type 2 diabetes  mellitus without complication, with long-term current use of insulin (HCC)   Overweight (BMI 25.0-29.9)  #1 acute on chronic pancreatitis with recurrent pancreatic pseudocyst -Patient admitted with acute on chronic pancreatitis with recurrent pseudocyst as noted on imaging. -Patient noted to have a lipase level on admission of 152, alk phosphatase of 235. -CT scan done showed evidence of significantly large pancreatic pseudocyst up to 6.3 cm. -GI consulted who consulted hepatobiliary surgery, Dr. Freida Busman for hepatobiliary surgery. -Patient transferred from [facility] to [facility] initially for probable cystogastrostomy. -General surgery discussed with patient and family again today and decision made to proceed with a distal pancreatectomy tomorrow 11/17/2022. -Currently n.p.o. except sips of clears. -Continue TPN. -Continue current pain control with Dilaudid PCA pump. -Per general surgery.  2.  Diabetes mellitus type 2 -Hemoglobin A1c 6.8 (07/11/2022) -CBG 104 this morning. -Hold home regimen Toujeo -On TPN. -SSI.  3.  COPD -Stable. -Place on Dulera and Incruse. -DuoNebs as needed.  4.  Overweight -Meets criteria for overweight with a BMI of 29.84 kg/m -Lifestyle modification -Outpatient follow-up with PCP.  DVT prophylaxis: Lovenox Code Status: Full Family Communication: Updated patient, mother, sister at bedside. Disposition: TBD  Status is: Inpatient Remains inpatient appropriate because: Severity of illness   Consultants:  Hepatobiliary surgery: Dr. Freida Busman 11/13/2022 Gastroenterology: Dr. Russella Dar 11/12/2022  Procedures:  CT abdomen and pelvis 11/12/2022 Chest x-ray 11/13/2022   Antimicrobials:  Anti-infectives (From admission, onward)    None         Subjective: Laying in bed.  Just transferred from [facility].  Denies any chest pain.  No shortness of breath.  Feels abdominal pain currently controlled on current PCA pump.  Mother and  sister at  bedside.  Objective: Vitals:   11/16/22 0828 11/16/22 0932 11/16/22 1122 11/16/22 1656  BP:  130/86    Pulse:  (!) 56    Resp: 18 18 16 15   Temp:  (!) 97.5 F (36.4 C)    TempSrc:  Oral    SpO2: 100% 99% 96% 95%  Weight:      Height:        Intake/Output Summary (Last 24 hours) at 11/16/2022 1819 Last data filed at 11/16/2022 1013 Gross per 24 hour  Intake 517.68 ml  Output --  Net 517.68 ml   Filed Weights   11/12/22 0812  Weight: 94.3 kg    Examination:  General exam: Appears calm and comfortable  Respiratory system: Clear to auscultation.  No wheezes, no crackles, no rhonchi.  Fair air movement.  Speaking in full sentences.  Respiratory effort normal. Cardiovascular system: S1 & S2 heard, RRR. No JVD, murmurs, rubs, gallops or clicks. No pedal edema. Gastrointestinal system: Abdomen is nondistended, soft and tenderness to palpation left upper quadrant, and epigastrium.  Positive bowel sounds.  No rebound.  No guarding.   Central nervous system: Alert and oriented. No focal neurological deficits. Extremities: Symmetric 5 x 5 power. Skin: No rashes, lesions or ulcers Psychiatry: Judgement and insight appear normal. Mood & affect appropriate.     Data Reviewed: I have personally reviewed following labs and imaging studies  CBC: Recent Labs  Lab 11/12/22 0848 11/13/22 0627 11/16/22 0440  WBC 10.2 8.9 6.7  NEUTROABS 7.6  --   --   HGB 18.3* 15.6 13.6  HCT 51.3 44.1 39.8  MCV 84.2 86.3 88.4  PLT 295 246 218    Basic Metabolic Panel: Recent Labs  Lab 11/12/22 0848 11/13/22 0627 11/14/22 0633 11/15/22 0538 11/16/22 0440  NA 132* 135 136 138 134*  K 3.5 4.0 4.2 3.7 3.6  CL 97* 98 101 104 102  CO2 21* 29 30 25 26   GLUCOSE 343* 182* 163* 109* 147*  BUN 23* 17 11 9 11   CREATININE 1.11 0.87 0.86 0.73 0.73  CALCIUM 8.8* 8.3* 8.1* 8.4* 8.1*  MG  --   --  2.0 1.9 1.9  PHOS  --   --  4.1 3.9 4.1    GFR: Estimated Creatinine Clearance: 125.9 mL/min (by C-G  formula based on SCr of 0.73 mg/dL).  Liver Function Tests: Recent Labs  Lab 11/12/22 0848 11/13/22 0627 11/14/22 0633 11/16/22 0440  AST 21 14* 31 33  ALT 42 28 32 56*  ALKPHOS 235* 168* 170* 222*  BILITOT 1.2 0.9 0.9 0.9  PROT 7.3 6.1* 5.7* 5.9*  ALBUMIN 4.3 3.5 3.2* 3.3*    CBG: Recent Labs  Lab 11/16/22 0020 11/16/22 0422 11/16/22 0706 11/16/22 1145 11/16/22 1722  GLUCAP 157* 137* 104* 183* 140*     No results found for this or any previous visit (from the past 240 hour(s)).       Radiology Studies: No results found.      Scheduled Meds:  Chlorhexidine Gluconate Cloth  6 each Topical Daily   Chlorhexidine Gluconate Cloth  6 each Topical Once   docusate sodium  100 mg Oral BID   enoxaparin (LOVENOX) injection  40 mg Subcutaneous QHS   HYDROmorphone   Intravenous Q4H   insulin aspart  0-15 Units Subcutaneous Q4H   sodium chloride flush  10-40 mL Intracatheter Q12H   [START ON 11/17/2022] thiamine (VITAMIN B1) injection  100 mg Intravenous Daily   Continuous Infusions:  sodium chloride 90 mL/hr at 11/16/22 0246   TPN ADULT (ION) 90 mL/hr at 11/16/22 1727     LOS: 4 days    Time spent: 40 minutes    Ramiro Harvest, MD [facility]   To contact the attending provider between 7A-7P or the covering provider during after hours 7P-7A, please log into the web site [email or web address] and access using universal [place] password for that web site. If you do not have the password, please call the hospital operator.  11/16/2022, 6:19 PM

## 2022-11-16 NOTE — Progress Notes (Addendum)
   Subjective/Chief Complaint: No new complaints. His family members are at the bedside, they have a lot of questions about surgery.    Objective: Vital signs in last 24 hours: Temp:  [97.4 F (36.3 C)-98.2 F (36.8 C)] 97.6 F (36.4 C) (05/13 0426) Pulse Rate:  [48-60] 48 (05/13 0426) Resp:  [16-20] 18 (05/13 0828) BP: (104-120)/(67-80) 120/80 (05/13 0426) SpO2:  [92 %-100 %] 100 % (05/13 0828) Last BM Date : 11/14/22  Intake/Output from previous day: No intake/output data recorded. Intake/Output this shift: Total I/O In: 507.7 [I.V.:507.7] Out: -   General appearance: alert and cooperative Resp: clear to auscultation bilaterally Cardio: regular rate and rhythm GI: soft, nontender  Lab Results:  Recent Labs    11/16/22 0440  WBC 6.7  HGB 13.6  HCT 39.8  PLT 218   BMET Recent Labs    11/15/22 0538 11/16/22 0440  NA 138 134*  K 3.7 3.6  CL 104 102  CO2 25 26  GLUCOSE 109* 147*  BUN 9 11  CREATININE 0.73 0.73  CALCIUM 8.4* 8.1*   PT/INR Recent Labs    11/13/22 1150  LABPROT 12.7  INR 0.9   ABG No results for input(s): "PHART", "HCO3" in the last 72 hours.  Invalid input(s): "PCO2", "PO2"  Studies/Results: No results found.  Anti-infectives: Anti-infectives (From admission, onward)    None       Assessment/Plan: PMH post-ERCP pancreatitis, presents with early satiety and vomiting. 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 Freida Busman for surgical cystogastrostomy Spoke to RN who states he has a bed, 6N05, and will be moved to [facility] this morning.      LOS: 4 days    Adam Phenix PA-C 11/16/2022

## 2022-11-16 NOTE — Progress Notes (Signed)
PHARMACY - TOTAL PARENTERAL NUTRITION CONSULT NOTE  Indication: Severe pancreatitic pseudocyst with GOO  Patient Measurements: Height: 5\' 10"  (177.8 cm) Weight: 94.3 kg (208 lb) IBW/kg (Calculated) : 73 TPN AdjBW (KG): 78.3 Body mass index is 29.84 kg/m.  Assessment:  43 yoM with a history of post-ERCP necrotizing pancreatitis.  CT scan shows enlargement of the pancreatic pseudocyst.  Early satiety and vomiting are presumably due to GOO secondary to mass effect of the cyst.  Attempted placement of small bore NJT on 5/10, but was positive for CO2 (placed in lung) and removed, patient refused further attempt.  Pharmacy has been consulted to dose TPN.    Glucose / Insulin: hx DM; A1c 6.8% (07/21/22), Toujeo 5 units BID PTA.  Inpatient started on Semglee 5 units QHS (last given on 5/10, then d/c) Two elevated CBGs 5/14 AM, unsure why - used 13 units SSI in past 24 hrs Electrolytes: all WNL (K 3.9 post PO) Renal: SCr < 1, BUN WNL Hepatic: alk phos mildly elevated, AST / AST / tbili / TG WNL, albumin 3.2, lipase 108 Intake / Output; MIVF: NS 90 ml/hr GI Imaging:  5/9 CT: enlarging fluid collection c/w pancreatic psuedocyst  GI Surgeries / Procedures:  5/14 open distal pancreatectomy and splenectomy pending  Central access: PICC placed 11/14/22 TPN start date: 11/14/22  Nutritional Goals:  RD Estimated Needs Total Energy Estimated Needs: 2200-2400 Total Protein Estimated Needs: 110-125g Total Fluid Estimated Needs: 2.2L/day  Current Nutrition:  TPN  Plan:  Continue TPN at goal rate of 90 ml/hr to provide 119g AA, 346g CHO, 65g ILE and 2298 kCal, meeting 100% of needs Electrolytes in TPN: increase Na to 80 mEq/L and K to 50 mEq/L on 5/13, Ca 18mEq/L, Mg 48mEq/L, Phos 109mmol/L, Cl:Ac 1:1 Add standard MVI and trace elements to TPN Increase SSI to resistant Q4H for now - anticipate post-op hyperglycemia.  Monitor and add insulin to TPN if needed. IV push thiamine 5/14-5/15 NS at 90  ml/hr per MD Monitor TPN labs on Mon/Thurs (CMP, Mag and Phos daily through 5/15 already ordered)   Doryce Mcgregory D. Laney Potash, PharmD, BCPS, BCCCP 11/17/2022, 9:15 AM

## 2022-11-16 NOTE — Progress Notes (Signed)
PHARMACY - TOTAL PARENTERAL NUTRITION CONSULT NOTE   Indication: Severe pancreatitis   Patient Measurements: Height: 5\' 10"  (177.8 cm) Weight: 94.3 kg (208 lb) IBW/kg (Calculated) : 73 TPN AdjBW (KG): 78.3 Body mass index is 29.84 kg/m. Usual Weight:   Assessment: 47 yoM with a history of post-ERCP necrotizing pancreatitis.  CT scan shows enlargement of the pancreatic pseudocyst.  Early satiety and vomiting are presumably due to gastric outlet obstruction secondary to mass effect of the cyst.  Attempted placement of small bore NJT on 5/10, but was positive for CO2 (placed in lung) and removed, patient refused further attempt.  Pharmacy has been consulted to dose TPN.    Glucose / Insulin: Hx DM; most recent A1c (07/21/22) 6.8 - Home insulin: Toujeo 5 units BID.  Inpatient started on Semglee 5 units QHS (last given on 5/10, then d/c) - mSSI q4h: used 11 units  in 24 hrs - CBGs: 104-157 Electrolytes: Na low 134, K 3.6, CorrCa 8.66; other lytes wnl Renal: SCr < 1, BUN wnl Hepatic: ALT slightly elevated at 56; Tbili wnl, alk phos up 222 - Trig 109 (5/12) - albumin low 3.3 Intake / Output; MIVF: IVF d/ced by MD on 5/12 - I/O: + 507 mL GI Imaging:  - 5/9 CT: enlarging fluid collection c/w pancreatic psuedocyst  GI Surgeries / Procedures:  - plan for cystogastrostomy at [facility]  Central access: PICC placement 5/11 TPN start date: 5/11   Nutritional Goals: Goal TPN rate is 90 mL/hr provides 119 g of protein and 2298 kcals per day.   RD Assessment: Estimated Needs Total Energy Estimated Needs: 2200-2400 Total Protein Estimated Needs: 110-125g Total Fluid Estimated Needs: 2.2L/day  Current Nutrition:  NPO  - 5/10 unsuccessful attempt to place small bore feeding tube  Plan:   Now: -  potassium chloride 10 meq IV x2 runs  At 1800:  - Advance TPN to goal rate of 90 mL/hr at 1800.  - Electrolytes in TPN:  Increase Na to 80 mEq/L Increase K to 50 mEq/L Ca 65mEq/L Mg  54mEq/L Phos 36mmol/L Cl:Ac 1:1 - Add standard MVI and trace elements to TPN - Thiamine 100mg  IV x5 days (5/11-5/15); Thiamine in TPN 5/11-5/13, IV push 5/14-5/15 - continue Moderate SSI q4h and adjust as needed. - Monitor TPN labs on Mon/Thurs, and PRN - CMET, phos and mag on 5/14 and 5/15   Dorna Leitz, PharmD, BCPS 11/16/2022 8:34 AM

## 2022-11-17 ENCOUNTER — Inpatient Hospital Stay (HOSPITAL_COMMUNITY): Payer: Medicaid Other

## 2022-11-17 ENCOUNTER — Encounter (HOSPITAL_COMMUNITY): Payer: Self-pay | Admitting: Internal Medicine

## 2022-11-17 ENCOUNTER — Other Ambulatory Visit: Payer: Self-pay

## 2022-11-17 ENCOUNTER — Encounter (HOSPITAL_COMMUNITY)
Admission: EM | Disposition: A | Discharge status: Home Health | Payer: Self-pay | Source: Home / Self Care | Attending: Surgery

## 2022-11-17 DIAGNOSIS — K862 Cyst of pancreas: Secondary | ICD-10-CM

## 2022-11-17 DIAGNOSIS — I10 Essential (primary) hypertension: Secondary | ICD-10-CM

## 2022-11-17 DIAGNOSIS — K859 Acute pancreatitis without necrosis or infection, unspecified: Secondary | ICD-10-CM | POA: Diagnosis not present

## 2022-11-17 DIAGNOSIS — F17210 Nicotine dependence, cigarettes, uncomplicated: Secondary | ICD-10-CM | POA: Diagnosis not present

## 2022-11-17 DIAGNOSIS — E1149 Type 2 diabetes mellitus with other diabetic neurological complication: Secondary | ICD-10-CM

## 2022-11-17 DIAGNOSIS — E119 Type 2 diabetes mellitus without complications: Secondary | ICD-10-CM | POA: Diagnosis not present

## 2022-11-17 DIAGNOSIS — K863 Pseudocyst of pancreas: Secondary | ICD-10-CM | POA: Diagnosis not present

## 2022-11-17 DIAGNOSIS — Z794 Long term (current) use of insulin: Secondary | ICD-10-CM

## 2022-11-17 DIAGNOSIS — J449 Chronic obstructive pulmonary disease, unspecified: Secondary | ICD-10-CM | POA: Diagnosis not present

## 2022-11-17 HISTORY — PX: SPLENECTOMY, TOTAL: SHX788

## 2022-11-17 LAB — BASIC METABOLIC PANEL
Anion gap: 8 (ref 5–15)
BUN: 18 mg/dL (ref 6–20)
CO2: 19 mmol/L — ABNORMAL LOW (ref 22–32)
Calcium: 7 mg/dL — ABNORMAL LOW (ref 8.9–10.3)
Chloride: 110 mmol/L (ref 98–111)
Creatinine, Ser: 1.05 mg/dL (ref 0.61–1.24)
GFR, Estimated: >60 mL/min (ref >60)
Glucose, Bld: 223 mg/dL — ABNORMAL HIGH (ref 70–99)
Potassium: 3.6 mmol/L (ref 3.5–5.1)
Sodium: 137 mmol/L (ref 135–145)

## 2022-11-17 LAB — CBC
HCT: 33 % — ABNORMAL LOW (ref 39.0–52.0)
Hemoglobin: 11.3 g/dL — ABNORMAL LOW (ref 13.0–17.0)
MCH: 29.8 pg (ref 26.0–34.0)
MCHC: 34.2 g/dL (ref 30.0–36.0)
MCV: 87.1 fL (ref 80.0–100.0)
Platelets: 183 10*3/uL (ref 150–400)
RBC: 3.79 MIL/uL — ABNORMAL LOW (ref 4.22–5.81)
RDW: 12.5 % (ref 11.5–15.5)
WBC: 13.6 10*3/uL — ABNORMAL HIGH (ref 4.0–10.5)
nRBC: 0 % (ref 0.0–0.2)

## 2022-11-17 LAB — GLUCOSE, CAPILLARY
Glucose-Capillary: 207 mg/dL — ABNORMAL HIGH (ref 70–99)
Glucose-Capillary: 154 mg/dL — ABNORMAL HIGH (ref 70–99)
Glucose-Capillary: 125 mg/dL — ABNORMAL HIGH (ref 70–99)
Glucose-Capillary: 194 mg/dL — ABNORMAL HIGH (ref 70–99)
Glucose-Capillary: 192 mg/dL — ABNORMAL HIGH (ref 70–99)
Glucose-Capillary: 220 mg/dL — ABNORMAL HIGH (ref 70–99)
Glucose-Capillary: 224 mg/dL — ABNORMAL HIGH (ref 70–99)
Glucose-Capillary: 405 mg/dL — ABNORMAL HIGH (ref 70–99)

## 2022-11-17 LAB — CBC WITH DIFFERENTIAL/PLATELET
Abs Immature Granulocytes: 0.03 10*3/uL (ref 0.00–0.07)
Basophils Absolute: 0.1 10*3/uL (ref 0.0–0.1)
Basophils Relative: 1 %
Eosinophils Absolute: 0.2 10*3/uL (ref 0.0–0.5)
Eosinophils Relative: 3 %
HCT: 41 % (ref 39.0–52.0)
Hemoglobin: 14.1 g/dL (ref 13.0–17.0)
Immature Granulocytes: 1 %
Lymphocytes Relative: 21 %
Lymphs Abs: 1.4 10*3/uL (ref 0.7–4.0)
MCH: 30.3 pg (ref 26.0–34.0)
MCHC: 34.4 g/dL (ref 30.0–36.0)
MCV: 88.2 fL (ref 80.0–100.0)
Monocytes Absolute: 0.5 10*3/uL (ref 0.1–1.0)
Monocytes Relative: 8 %
Neutro Abs: 4.3 10*3/uL (ref 1.7–7.7)
Neutrophils Relative %: 66 %
Platelets: 209 10*3/uL (ref 150–400)
RBC: 4.65 MIL/uL (ref 4.22–5.81)
RDW: 12.3 % (ref 11.5–15.5)
WBC: 6.5 10*3/uL (ref 4.0–10.5)
nRBC: 0 % (ref 0.0–0.2)

## 2022-11-17 LAB — PHOSPHORUS: Phosphorus: 3.7 mg/dL (ref 2.5–4.6)

## 2022-11-17 LAB — COMPREHENSIVE METABOLIC PANEL
ALT: 42 U/L (ref 0–44)
AST: 19 U/L (ref 15–41)
Albumin: 3.2 g/dL — ABNORMAL LOW (ref 3.5–5.0)
Alkaline Phosphatase: 219 U/L — ABNORMAL HIGH (ref 38–126)
Anion gap: 9 (ref 5–15)
BUN: 11 mg/dL (ref 6–20)
CO2: 27 mmol/L (ref 22–32)
Calcium: 8.8 mg/dL — ABNORMAL LOW (ref 8.9–10.3)
Chloride: 102 mmol/L (ref 98–111)
Creatinine, Ser: 0.82 mg/dL (ref 0.61–1.24)
GFR, Estimated: >60 mL/min (ref >60)
Glucose, Bld: 197 mg/dL — ABNORMAL HIGH (ref 70–99)
Potassium: 3.9 mmol/L (ref 3.5–5.1)
Sodium: 138 mmol/L (ref 135–145)
Total Bilirubin: 0.7 mg/dL (ref 0.3–1.2)
Total Protein: 5.9 g/dL — ABNORMAL LOW (ref 6.5–8.1)

## 2022-11-17 LAB — SURGICAL PCR SCREEN
MRSA, PCR: NEGATIVE
Staphylococcus aureus: NEGATIVE

## 2022-11-17 LAB — BPAM RBC
Blood Product Expiration Date: 202406042359
Blood Product Expiration Date: 202406042359
ISSUE DATE / TIME: 202405141408
Unit Type and Rh: 7300

## 2022-11-17 LAB — TYPE AND SCREEN: Unit division: 0

## 2022-11-17 LAB — PREPARE RBC (CROSSMATCH)

## 2022-11-17 LAB — MAGNESIUM: Magnesium: 2.1 mg/dL (ref 1.7–2.4)

## 2022-11-17 SURGERY — PANCREATECTOMY
Anesthesia: General

## 2022-11-17 MED ORDER — SUCCINYLCHOLINE CHLORIDE 200 MG/10ML IV SOSY
PREFILLED_SYRINGE | INTRAVENOUS | Status: AC
  Filled 2022-11-17: qty 10

## 2022-11-17 MED ORDER — ROCURONIUM BROMIDE 10 MG/ML (PF) SYRINGE
PREFILLED_SYRINGE | INTRAVENOUS | Status: AC
  Filled 2022-11-17: qty 30

## 2022-11-17 MED ORDER — LACTATED RINGERS IV SOLN: INTRAVENOUS | Status: DC

## 2022-11-17 MED ORDER — TRAVASOL 10 % IV SOLN
INTRAVENOUS | Status: AC
  Filled 2022-11-17: qty 1188

## 2022-11-17 MED ORDER — SODIUM CHLORIDE 0.9 % IV SOLN: 10.0000 mL/h | Freq: Once | INTRAVENOUS | Status: AC

## 2022-11-17 MED ORDER — INSULIN ASPART 100 UNIT/ML IJ SOLN
0.0000 [IU] | INTRAMUSCULAR | Status: DC
  Administered 2022-11-17: 7 [IU] via SUBCUTANEOUS
  Administered 2022-11-17: 4 [IU] via SUBCUTANEOUS
  Administered 2022-11-18: 20 [IU] via SUBCUTANEOUS
  Administered 2022-11-18: 15 [IU] via SUBCUTANEOUS
  Administered 2022-11-18: 20 [IU] via SUBCUTANEOUS
  Administered 2022-11-18 (×2): 4 [IU] via SUBCUTANEOUS
  Administered 2022-11-19: 15 [IU] via SUBCUTANEOUS
  Administered 2022-11-19: 4 [IU] via SUBCUTANEOUS
  Administered 2022-11-19: 11 [IU] via SUBCUTANEOUS
  Administered 2022-11-19 (×3): 4 [IU] via SUBCUTANEOUS
  Administered 2022-11-19: 7 [IU] via SUBCUTANEOUS
  Administered 2022-11-20: 4 [IU] via SUBCUTANEOUS
  Administered 2022-11-20: 7 [IU] via SUBCUTANEOUS

## 2022-11-17 MED ORDER — FENTANYL CITRATE (PF) 250 MCG/5ML IJ SOLN
INTRAMUSCULAR | Status: DC | PRN
  Administered 2022-11-17: 50 ug via INTRAVENOUS
  Administered 2022-11-17: 100 ug via INTRAVENOUS
  Administered 2022-11-17 (×3): 50 ug via INTRAVENOUS
  Administered 2022-11-17: 100 ug via INTRAVENOUS
  Administered 2022-11-17 (×2): 50 ug via INTRAVENOUS

## 2022-11-17 MED ORDER — SUGAMMADEX SODIUM 200 MG/2ML IV SOLN
INTRAVENOUS | Status: DC | PRN
  Administered 2022-11-17: 183.2 mg via INTRAVENOUS

## 2022-11-17 MED ORDER — PHENYLEPHRINE 80 MCG/ML (10ML) SYRINGE FOR IV PUSH (FOR BLOOD PRESSURE SUPPORT)
PREFILLED_SYRINGE | INTRAVENOUS | Status: DC | PRN
  Administered 2022-11-17 (×4): 160 ug via INTRAVENOUS
  Administered 2022-11-17: 80 ug via INTRAVENOUS
  Administered 2022-11-17 (×4): 160 ug via INTRAVENOUS

## 2022-11-17 MED ORDER — ALBUMIN HUMAN 5 % IV SOLN: INTRAVENOUS | Status: DC | PRN

## 2022-11-17 MED ORDER — METHOCARBAMOL 1000 MG/10ML IJ SOLN
1000.0000 mg | Freq: Three times a day (TID) | INTRAVENOUS | Status: DC
  Administered 2022-11-17 – 2022-11-21 (×11): 1000 mg via INTRAVENOUS
  Filled 2022-11-17 (×2): qty 1000
  Filled 2022-11-17 (×2): qty 10
  Filled 2022-11-17 (×4): qty 1000
  Filled 2022-11-17 (×2): qty 10
  Filled 2022-11-17 (×3): qty 1000

## 2022-11-17 MED ORDER — INSULIN ASPART 100 UNIT/ML IJ SOLN
0.0000 [IU] | INTRAMUSCULAR | Status: DC | PRN
  Administered 2022-11-17: 4 [IU] via SUBCUTANEOUS
  Filled 2022-11-17: qty 1

## 2022-11-17 MED ORDER — CEFAZOLIN SODIUM-DEXTROSE 2-4 GM/100ML-% IV SOLN
2.0000 g | INTRAVENOUS | Status: AC
  Administered 2022-11-17: 2 g via INTRAVENOUS
  Filled 2022-11-17: qty 100

## 2022-11-17 MED ORDER — LIDOCAINE 2% (20 MG/ML) 5 ML SYRINGE
INTRAMUSCULAR | Status: DC | PRN
  Administered 2022-11-17: 60 mg via INTRAVENOUS

## 2022-11-17 MED ORDER — PROPOFOL 10 MG/ML IV BOLUS
INTRAVENOUS | Status: DC | PRN
  Administered 2022-11-17: 150 mg via INTRAVENOUS
  Administered 2022-11-17: 50 mg via INTRAVENOUS

## 2022-11-17 MED ORDER — METRONIDAZOLE 500 MG/100ML IV SOLN
500.0000 mg | INTRAVENOUS | Status: AC
  Administered 2022-11-17: 500 mg via INTRAVENOUS
  Filled 2022-11-17: qty 100

## 2022-11-17 MED ORDER — ORAL CARE MOUTH RINSE: 15.0000 mL | Freq: Once | OROMUCOSAL | Status: AC

## 2022-11-17 MED ORDER — CHLORHEXIDINE GLUCONATE 0.12 % MT SOLN: 15.0000 mL | Freq: Once | OROMUCOSAL | Status: AC

## 2022-11-17 MED ORDER — ENOXAPARIN SODIUM 40 MG/0.4ML IJ SOSY
40.0000 mg | PREFILLED_SYRINGE | INTRAMUSCULAR | Status: DC
  Administered 2022-11-18 – 2022-11-23 (×5): 40 mg via SUBCUTANEOUS
  Filled 2022-11-17 (×5): qty 0.4

## 2022-11-17 MED ORDER — DEXAMETHASONE SODIUM PHOSPHATE 10 MG/ML IJ SOLN
INTRAMUSCULAR | Status: DC | PRN
  Administered 2022-11-17: 10 mg via INTRAVENOUS

## 2022-11-17 MED ORDER — ONDANSETRON HCL 4 MG/2ML IJ SOLN
INTRAMUSCULAR | Status: DC | PRN
  Administered 2022-11-17: 4 mg via INTRAVENOUS

## 2022-11-17 MED ORDER — OXYCODONE HCL 5 MG PO TABS: 5.0000 mg | ORAL_TABLET | Freq: Once | ORAL | Status: DC | PRN

## 2022-11-17 MED ORDER — LIDOCAINE 2% (20 MG/ML) 5 ML SYRINGE
INTRAMUSCULAR | Status: AC
  Filled 2022-11-17: qty 10

## 2022-11-17 MED ORDER — OXYCODONE HCL 5 MG/5ML PO SOLN: 5.0000 mg | Freq: Once | ORAL | Status: DC | PRN

## 2022-11-17 MED ORDER — ROCURONIUM BROMIDE 10 MG/ML (PF) SYRINGE
PREFILLED_SYRINGE | INTRAVENOUS | Status: DC | PRN
  Administered 2022-11-17: 20 mg via INTRAVENOUS
  Administered 2022-11-17: 100 mg via INTRAVENOUS

## 2022-11-17 MED ORDER — EPHEDRINE SULFATE-NACL 50-0.9 MG/10ML-% IV SOSY
PREFILLED_SYRINGE | INTRAVENOUS | Status: DC | PRN
  Administered 2022-11-17: 5 mg via INTRAVENOUS

## 2022-11-17 MED ORDER — HYDROMORPHONE HCL 1 MG/ML IJ SOLN
INTRAMUSCULAR | Status: DC | PRN
  Administered 2022-11-17: .5 mg via INTRAVENOUS

## 2022-11-17 MED ORDER — SODIUM CHLORIDE 0.9% FLUSH: 10.0000 mL | INTRAVENOUS | Status: DC | PRN

## 2022-11-17 MED ORDER — HYDROMORPHONE HCL 1 MG/ML IJ SOLN
INTRAMUSCULAR | Status: AC
  Filled 2022-11-17: qty 1

## 2022-11-17 MED ORDER — AMISULPRIDE (ANTIEMETIC) 5 MG/2ML IV SOLN: 10.0000 mg | Freq: Once | INTRAVENOUS | Status: DC | PRN

## 2022-11-17 MED ORDER — PROMETHAZINE HCL 25 MG/ML IJ SOLN: 6.2500 mg | INTRAMUSCULAR | Status: DC | PRN

## 2022-11-17 MED ORDER — LACTATED RINGERS IV BOLUS
1000.0000 mL | Freq: Once | INTRAVENOUS | Status: AC
  Administered 2022-11-17: 1000 mL via INTRAVENOUS

## 2022-11-17 MED ORDER — VISTASEAL 10 ML SINGLE DOSE KIT
10.0000 mL | PACK | CUTANEOUS | Status: AC
  Filled 2022-11-17: qty 10

## 2022-11-17 MED ORDER — ACETAMINOPHEN 10 MG/ML IV SOLN
1000.0000 mg | Freq: Three times a day (TID) | INTRAVENOUS | Status: AC
  Administered 2022-11-17 – 2022-11-18 (×3): 1000 mg via INTRAVENOUS
  Filled 2022-11-17 (×3): qty 100

## 2022-11-17 MED ORDER — ACETAMINOPHEN 10 MG/ML IV SOLN: 1000.0000 mg | Freq: Once | INTRAVENOUS | Status: DC | PRN

## 2022-11-17 MED ORDER — 0.9 % SODIUM CHLORIDE (POUR BTL) OPTIME
TOPICAL | Status: DC | PRN
  Administered 2022-11-17: 2000 mL

## 2022-11-17 MED ORDER — VISTASEAL 10 ML SINGLE DOSE KIT
PACK | CUTANEOUS | Status: DC | PRN
  Administered 2022-11-17: 10 mL via TOPICAL

## 2022-11-17 MED ORDER — CHLORHEXIDINE GLUCONATE 0.12 % MT SOLN
OROMUCOSAL | Status: AC
  Administered 2022-11-17: 15 mL via OROMUCOSAL
  Filled 2022-11-17: qty 15

## 2022-11-17 MED ORDER — SODIUM CHLORIDE 0.9% FLUSH
10.0000 mL | Freq: Two times a day (BID) | INTRAVENOUS | Status: DC
  Administered 2022-11-17 – 2022-11-18 (×2): 10 mL
  Administered 2022-11-18: 20 mL
  Administered 2022-11-19 – 2022-11-23 (×9): 10 mL

## 2022-11-17 MED ORDER — DEXAMETHASONE SODIUM PHOSPHATE 10 MG/ML IJ SOLN
INTRAMUSCULAR | Status: AC
  Filled 2022-11-17: qty 2

## 2022-11-17 MED ORDER — FENTANYL CITRATE (PF) 250 MCG/5ML IJ SOLN
INTRAMUSCULAR | Status: AC
  Filled 2022-11-17: qty 5

## 2022-11-17 MED ORDER — EPHEDRINE 5 MG/ML INJ
INTRAVENOUS | Status: AC
  Filled 2022-11-17: qty 10

## 2022-11-17 MED ORDER — PHENYLEPHRINE HCL-NACL 20-0.9 MG/250ML-% IV SOLN
INTRAVENOUS | Status: DC | PRN
  Administered 2022-11-17: 40 ug/min via INTRAVENOUS
  Administered 2022-11-17: 30 ug via INTRAVENOUS

## 2022-11-17 MED ORDER — SUCCINYLCHOLINE CHLORIDE 200 MG/10ML IV SOSY
PREFILLED_SYRINGE | INTRAVENOUS | Status: DC | PRN
  Administered 2022-11-17: 120 mg via INTRAVENOUS

## 2022-11-17 MED ORDER — PHENYLEPHRINE 80 MCG/ML (10ML) SYRINGE FOR IV PUSH (FOR BLOOD PRESSURE SUPPORT)
PREFILLED_SYRINGE | INTRAVENOUS | Status: AC
  Filled 2022-11-17: qty 20

## 2022-11-17 MED ORDER — ESMOLOL HCL 100 MG/10ML IV SOLN
INTRAVENOUS | Status: DC | PRN
  Administered 2022-11-17: 50 ug via INTRAVENOUS

## 2022-11-17 MED ORDER — FENTANYL CITRATE (PF) 100 MCG/2ML IJ SOLN: 25.0000 ug | INTRAMUSCULAR | Status: DC | PRN

## 2022-11-17 SURGICAL SUPPLY — 100 items
ADH SKN CLS APL DERMABOND .7 (GAUZE/BANDAGES/DRESSINGS) ×1
APL PRP STRL LF DISP 70% ISPRP (MISCELLANEOUS) ×1
BAG COUNTER SPONGE SURGICOUNT (BAG) ×1 IMPLANT
BAG SPNG CNTER NS LX DISP (BAG) ×1
BLADE CLIPPER SURG (BLADE) IMPLANT
BLADE EXTENDED COATED 6.5IN (ELECTRODE) IMPLANT
CANISTER SUCT 3000ML PPV (MISCELLANEOUS) ×1 IMPLANT
CATH FOLEY 2WAY SLVR  5CC 16FR (CATHETERS) ×1
CATH FOLEY 2WAY SLVR 5CC 16FR (CATHETERS) IMPLANT
CHLORAPREP W/TINT 26 (MISCELLANEOUS) ×1 IMPLANT
CLIP TI LARGE 6 (CLIP) IMPLANT
CLIP TI MEDIUM 24 (CLIP) IMPLANT
CLIP TI WIDE RED SMALL 24 (CLIP) IMPLANT
COVER SURGICAL LIGHT HANDLE (MISCELLANEOUS) ×1 IMPLANT
DERMABOND ADVANCED .7 DNX12 (GAUZE/BANDAGES/DRESSINGS) ×1 IMPLANT
DRAIN CHANNEL 19F RND (DRAIN) IMPLANT
DRAPE INCISE IOBAN 66X45 STRL (DRAPES) ×1 IMPLANT
DRAPE LAPAROSCOPIC ABDOMINAL (DRAPES) ×1 IMPLANT
DRAPE WARM FLUID 44X44 (DRAPES) ×1 IMPLANT
DRSG OPSITE POSTOP 4X10 (GAUZE/BANDAGES/DRESSINGS) IMPLANT
DRSG TEGADERM 4X4.75 (GAUZE/BANDAGES/DRESSINGS) IMPLANT
ELECT BLADE 6.5 EXT (BLADE) ×1 IMPLANT
ELECT CAUTERY BLADE 6.4 (BLADE) ×1 IMPLANT
ELECT PAD DSPR THERM+ ADLT (MISCELLANEOUS) IMPLANT
ELECT REM PT RETURN 9FT ADLT (ELECTROSURGICAL) ×1
ELECTRODE REM PT RTRN 9FT ADLT (ELECTROSURGICAL) ×1 IMPLANT
EVACUATOR SILICONE 100CC (DRAIN) ×1 IMPLANT
GAUZE 4X4 16PLY ~~LOC~~+RFID DBL (SPONGE) IMPLANT
GAUZE SPONGE 4X4 12PLY STRL (GAUZE/BANDAGES/DRESSINGS) ×1 IMPLANT
GLOVE BIOGEL PI IND STRL 6 (GLOVE) ×1 IMPLANT
GLOVE BIOGEL PI IND STRL 8 (GLOVE) ×1 IMPLANT
GLOVE SURG POLY MICRO LF SZ5.5 (GLOVE) ×1 IMPLANT
GLOVE SURG UNDER POLY LF SZ6 (GLOVE) ×1 IMPLANT
GOWN STRL REUS W/ TWL LRG LVL3 (GOWN DISPOSABLE) ×3 IMPLANT
GOWN STRL REUS W/ TWL XL LVL3 (GOWN DISPOSABLE) ×1 IMPLANT
GOWN STRL REUS W/TWL LRG LVL3 (GOWN DISPOSABLE) ×3
GOWN STRL REUS W/TWL XL LVL3 (GOWN DISPOSABLE) ×1
HAND PENCIL TRP OPTION (MISCELLANEOUS) IMPLANT
HANDLE SUCTION POOLE (INSTRUMENTS) ×1 IMPLANT
KIT BASIN OR (CUSTOM PROCEDURE TRAY) ×1 IMPLANT
KIT TURNOVER KIT B (KITS) ×1 IMPLANT
LIGASURE IMPACT 36 18CM CVD LR (INSTRUMENTS) ×1 IMPLANT
LOOP VASCLR MAXI BLUE 18IN ST (MISCELLANEOUS) ×1 IMPLANT
LOOP VASCULAR MAXI 18 BLUE (MISCELLANEOUS) ×1
LOOP VASCULAR MINI 18 RED (MISCELLANEOUS) ×1
LOOPS VASCLR MAXI BLUE 18IN ST (MISCELLANEOUS) ×1 IMPLANT
NS IRRIG 1000ML POUR BTL (IV SOLUTION) ×2 IMPLANT
PACK GENERAL/GYN (CUSTOM PROCEDURE TRAY) ×1 IMPLANT
PAD ARMBOARD 7.5X6 YLW CONV (MISCELLANEOUS) ×1 IMPLANT
PENCIL SMOKE EVACUATOR (MISCELLANEOUS) ×1 IMPLANT
RELOAD STAPLE 60 2.6 WHT THN (STAPLE) IMPLANT
RELOAD STAPLE 60 4.1 GRN THCK (STAPLE) IMPLANT
RELOAD STAPLER GREEN 60MM (STAPLE) ×2 IMPLANT
RELOAD STAPLER WHITE 60MM (STAPLE) ×3 IMPLANT
RETRACTOR WOUND ALXS 34CM XLRG (MISCELLANEOUS) ×1 IMPLANT
RTRCTR WOUND ALEXIS 34CM XLRG (MISCELLANEOUS) ×1
SHEARS FOC LG CVD HARMONIC 17C (MISCELLANEOUS) IMPLANT
SPECIMEN JAR X LARGE (MISCELLANEOUS) ×1 IMPLANT
SPONGE INTESTINAL PEANUT (DISPOSABLE) IMPLANT
SPONGE T-LAP 18X18 ~~LOC~~+RFID (SPONGE) ×1 IMPLANT
STAPLE ECHEON FLEX 60 POW ENDO (STAPLE) ×1 IMPLANT
STAPLER RELOAD GREEN 60MM (STAPLE) ×2
STAPLER RELOAD WHITE 60MM (STAPLE) ×3
STAPLER VISISTAT 35W (STAPLE) ×1 IMPLANT
SUCTION POOLE HANDLE (INSTRUMENTS) ×1
SUT ETHILON 2 0 FS 18 (SUTURE) ×1 IMPLANT
SUT MNCRL AB 4-0 PS2 18 (SUTURE) ×1 IMPLANT
SUT PDS AB 1 TP1 54 (SUTURE) IMPLANT
SUT PDS AB 3-0 SH 27 (SUTURE) IMPLANT
SUT PROLENE 2 0 SH 30 (SUTURE) ×1 IMPLANT
SUT PROLENE 3 0 SH 1 (SUTURE) ×1 IMPLANT
SUT PROLENE 3 0 SH 48 (SUTURE) ×1 IMPLANT
SUT PROLENE 4 0 RB 1 (SUTURE) ×4
SUT PROLENE 4-0 RB1 .5 CRCL 36 (SUTURE) ×1 IMPLANT
SUT SILK 0 TIES 10X30 (SUTURE) ×1 IMPLANT
SUT SILK 2 0 SH (SUTURE) ×2 IMPLANT
SUT SILK 2 0 TIES 10X30 (SUTURE) ×1 IMPLANT
SUT SILK 2 0SH CR/8 30 (SUTURE) ×1 IMPLANT
SUT SILK 3 0 TIES 10X30 (SUTURE) ×1 IMPLANT
SUT SILK 3 0SH CR/8 30 (SUTURE) ×1 IMPLANT
SUT VIC AB 2-0 CT1 27 (SUTURE) ×1
SUT VIC AB 2-0 CT1 TAPERPNT 27 (SUTURE) ×1 IMPLANT
SUT VIC AB 2-0 SH 18 (SUTURE) IMPLANT
SUT VIC AB 3-0 SH 18 (SUTURE) ×1 IMPLANT
SUT VIC AB 3-0 SH 27 (SUTURE)
SUT VIC AB 3-0 SH 27X BRD (SUTURE) IMPLANT
SUT VIC AB 4-0 SH 18 (SUTURE) IMPLANT
SUT VICRYL 2 0 18  UND BR (SUTURE)
SUT VICRYL 2 0 18 UND BR (SUTURE) IMPLANT
SUT VICRYL 3 0 BR 18  UND (SUTURE)
SUT VICRYL 3 0 BR 18 UND (SUTURE) IMPLANT
SYR BULB IRRIG 60ML STRL (SYRINGE) ×1 IMPLANT
TOWEL GREEN STERILE (TOWEL DISPOSABLE) ×1 IMPLANT
TOWEL GREEN STERILE FF (TOWEL DISPOSABLE) ×1 IMPLANT
TRAY FOLEY MTR SLVR 14FR STAT (SET/KITS/TRAYS/PACK) ×1 IMPLANT
TRAY FOLEY MTR SLVR 16FR STAT (SET/KITS/TRAYS/PACK) ×1 IMPLANT
TUBE CONNECTING 12X1/4 (SUCTIONS) IMPLANT
VASCULAR TIE MAXI BLUE 18IN ST (MISCELLANEOUS) ×1
VASCULAR TIE MINI RED 18IN STL (MISCELLANEOUS) ×1 IMPLANT
YANKAUER SUCT BULB TIP NO VENT (SUCTIONS) IMPLANT

## 2022-11-17 NOTE — Progress Notes (Signed)
PROGRESS NOTE    Steve Romero  OZH:[id number] DOB: 1971-03-02 DOA: 11/12/2022 PCP: Raliegh Ip, DO    Chief Complaint  Patient presents with   Abdominal Pain    Brief Narrative:  52 year old male with past medical history of diabetes mellitus type 2 on insulin, bipolar disorder and chronic pancreatitis with known pancreatic pseudocyst after complications from elective laparoscopic cholecystectomy in April 2022.  He had subsequent issues with severe necrotizing pancreatitis after ERCP and then subsequently developed a biliary stricture for which she had a metal CBD stent and balloon sphincterotomy in November 2023.  Patient has had recurrent pseudocyst which has been drained endoscopically multiple times by GI.  Patient continues to have chronic abdominal pain and was seen in consultation by Dr. Freida Busman of hepatobiliary surgery who recommended continued observation.  Patient continues to have abdominal pain, but presented to the hospital on the morning of 5/9 with worsening abdominal pain with associated nausea times several days.  Lab work noteworthy for glucose of 343, alkaline phosphatase of 235 and lipase of 152.  CT scan noted evidence of significantly large pancreatic pseudocyst up to 6.3 cm.  Gastroenterology consulted, who in turn have also consulted hepatobiliary surgery.  Dr. Freida Busman of hepatobiliary surgery who has been following with the patient in the office, plans to to do a cystogastrostomy at [facility] on 5/13.  Patient transferred from [facility] 5/13.  Patient reassessed by general surgery who has discussed options with patient and patient scheduled for distal pancreatectomy and splenectomy 11/17/2022 per general surgery.  Assessment & Plan:   Principal Problem:   Pancreatic pseudocyst Active Problems:   Acute on chronic pancreatitis (HCC)   COPD (chronic obstructive pulmonary disease) (HCC)   Abnormal LFTs   Abdominal pain, chronic, epigastric   Controlled type 2  diabetes mellitus without complication, with long-term current use of insulin (HCC)   Overweight (BMI 25.0-29.9)  #1 acute on chronic pancreatitis with recurrent pancreatic pseudocyst -Patient admitted with acute on chronic pancreatitis with recurrent pseudocyst as noted on imaging. -Patient noted to have a lipase level on admission of 152, alk phosphatase of 235. -CT scan done showed evidence of significantly large pancreatic pseudocyst up to 6.3 cm. -GI consulted who consulted hepatobiliary surgery, Dr. Freida Busman for hepatobiliary surgery. -Patient transferred from [facility] to [facility] initially for probable cystogastrostomy. -General surgery discussed with patient and family again and decision made to proceed with a distal pancreatectomy and splenectomy today, 11/17/2022. -General surgery planning to administer postsplenectomy vaccines prior to discharge. -Currently n.p.o. except sips of clears. -Continue TPN. -Continue current pain control with Dilaudid PCA pump. -Per general surgery.  2.  Diabetes mellitus type 2 -Hemoglobin A1c 6.8 (07/11/2022) -CBG 207>>> 154 this morning. -Continue to hold home regimen Toujeo -On TPN. -SSI.  3.  COPD -Stable.   -Continue Dulera, Incruse, DuoNebs as needed to optimize pulmonary status prior to surgery today.   4.  Overweight -Meets criteria for overweight with a BMI of 29.84 kg/m -Lifestyle modification -Outpatient follow-up with PCP.  DVT prophylaxis: Lovenox Code Status: Full Family Communication: Updated patient, no family at bedside.  Disposition: TBD  Status is: Inpatient Remains inpatient appropriate because: Severity of illness   Consultants:  Hepatobiliary surgery: Dr. Freida Busman 11/13/2022 Gastroenterology: Dr. Russella Dar 11/12/2022  Procedures:  CT abdomen and pelvis 11/12/2022 Chest x-ray 11/13/2022   Antimicrobials:  Anti-infectives (From admission, onward)    Start     Dose/Rate Route Frequency Ordered Stop   11/17/22 0600   ceFAZolin (ANCEF)  IVPB 2g/100 mL premix       See Hyperspace for full Linked Orders Report.   2 g 200 mL/hr over 30 Minutes Intravenous On call to O.R. 11/17/22 0104 11/18/22 0559   11/17/22 0600  metroNIDAZOLE (FLAGYL) IVPB 500 mg       See Hyperspace for full Linked Orders Report.   500 mg 100 mL/hr over 60 Minutes Intravenous On call to O.R. 11/17/22 0104 11/18/22 0559         Subjective: Patient sitting up in bed.  Still with epigastric and left upper quadrant abdominal pain.  States Dilaudid PCA pump helping manage pain currently.  No chest pain.  No shortness of breath.   Objective: Vitals:   11/17/22 0500 11/17/22 0548 11/17/22 0837 11/17/22 0859  BP:  131/84 (!) 155/90   Pulse:  (!) 54 66   Resp:   18 18  Temp:  97.8 F (36.6 C) 98 F (36.7 C)   TempSrc:  Oral Oral   SpO2:  99% 98% 98%  Weight: 99.5 kg     Height:        Intake/Output Summary (Last 24 hours) at 11/17/2022 0944 Last data filed at 11/16/2022 1013 Gross per 24 hour  Intake 10 ml  Output --  Net 10 ml    Filed Weights   11/12/22 0812 11/17/22 0500  Weight: 94.3 kg 99.5 kg    Examination:  General exam: NAD Respiratory system: CTAB.  No wheezes, no crackles, no rhonchi.  Fair air movement.  Speaking in full sentences.   Cardiovascular system: RRR no murmurs rubs or gallops.  No JVD.  No lower extremity edema.  Gastrointestinal system: Abdomen is soft, nondistended, tender to palpation in the left upper quadrant and epigastric region.  Positive bowel sounds.  No rebound.  No guarding.  Central nervous system: Alert and oriented. No focal neurological deficits. Extremities: Symmetric 5 x 5 power. Skin: No rashes, lesions or ulcers Psychiatry: Judgement and insight appear normal. Mood & affect appropriate.     Data Reviewed: I have personally reviewed following labs and imaging studies  CBC: Recent Labs  Lab 11/12/22 0848 11/13/22 0627 11/16/22 0440 11/17/22 0522  WBC 10.2 8.9 6.7 6.5   NEUTROABS 7.6  --   --  4.3  HGB 18.3* 15.6 13.6 14.1  HCT 51.3 44.1 39.8 41.0  MCV 84.2 86.3 88.4 88.2  PLT 295 246 218 209     Basic Metabolic Panel: Recent Labs  Lab 11/13/22 0627 11/14/22 0633 11/15/22 0538 11/16/22 0440 11/17/22 0522  NA 135 136 138 134* 138  K 4.0 4.2 3.7 3.6 3.9  CL 98 101 104 102 102  CO2 29 30 25 26 27   GLUCOSE 182* 163* 109* 147* 197*  BUN 17 11 9 11 11   CREATININE 0.87 0.86 0.73 0.73 0.82  CALCIUM 8.3* 8.1* 8.4* 8.1* 8.8*  MG  --  2.0 1.9 1.9 2.1  PHOS  --  4.1 3.9 4.1 3.7     GFR: Estimated Creatinine Clearance: 126 mL/min (by C-G formula based on SCr of 0.82 mg/dL).  Liver Function Tests: Recent Labs  Lab 11/12/22 0848 11/13/22 0627 11/14/22 0633 11/16/22 0440 11/17/22 0522  AST 21 14* 31 33 19  ALT 42 28 32 56* 42  ALKPHOS 235* 168* 170* 222* 219*  BILITOT 1.2 0.9 0.9 0.9 0.7  PROT 7.3 6.1* 5.7* 5.9* 5.9*  ALBUMIN 4.3 3.5 3.2* 3.3* 3.2*     CBG: Recent Labs  Lab 11/16/22 1722 11/16/22  2107 11/16/22 2304 11/17/22 0548 11/17/22 0834  GLUCAP 140* 126* 164* 207* 154*      Recent Results (from the past 240 hour(s))  Surgical pcr screen     Status: None   Collection Time: 11/17/22  1:34 AM   Specimen: Nasal Mucosa; Nasal Swab  Result Value Ref Range Status   MRSA, PCR NEGATIVE NEGATIVE Final   Staphylococcus aureus NEGATIVE NEGATIVE Final    Comment: (NOTE) The Xpert SA Assay (FDA approved for NASAL specimens in patients 5 years of age and older), is one component of a comprehensive surveillance program. It is not intended to diagnose infection nor to guide or monitor treatment. Performed at [facility], [address] [place], [place] [address]          Radiology Studies: No results found.      Scheduled Meds:  Chlorhexidine Gluconate Cloth  6 each Topical Daily   docusate sodium  100 mg Oral BID   enoxaparin (LOVENOX) injection  40 mg Subcutaneous QHS   HYDROmorphone   Intravenous Q4H    insulin aspart  0-20 Units Subcutaneous Q4H   mometasone-formoterol  2 puff Inhalation BID   sodium chloride flush  10-40 mL Intracatheter Q12H   thiamine (VITAMIN B1) injection  100 mg Intravenous Daily   umeclidinium bromide  1 puff Inhalation Daily   Continuous Infusions:  sodium chloride 90 mL/hr at 11/16/22 0246    ceFAZolin (ANCEF) IV     And   metronidazole     TPN ADULT (ION) 90 mL/hr at 11/16/22 1727   TPN ADULT (ION)       LOS: 5 days    Time spent: 35 minutes    Ramiro Harvest, MD [facility]   To contact the attending provider between 7A-7P or the covering provider during after hours 7P-7A, please log into the web site [email or web address] and access using universal [place] password for that web site. If you do not have the password, please call the hospital operator.  11/17/2022, 9:44 AM

## 2022-11-17 NOTE — Anesthesia Procedure Notes (Signed)
Central Venous Catheter Insertion Performed by: Leonides Grills, MD, anesthesiologist Start/End5/14/2024 2:20 PM, 11/17/2022 2:30 PM Patient location: OR. Preanesthetic checklist: patient identified, IV checked, site marked, risks and benefits discussed, surgical consent, monitors and equipment checked, pre-op evaluation, timeout performed and anesthesia consent Position: Trendelenburg Patient sedated Hand hygiene performed , maximum sterile barriers used  and Seldinger technique used Catheter size: 8 Fr Total catheter length 16. Central line was placed.Double lumen Procedure performed using ultrasound guided technique. Ultrasound Notes:anatomy identified, needle tip was noted to be adjacent to the nerve/plexus identified, no ultrasound evidence of intravascular and/or intraneural injection and image(s) printed for medical record Attempts: 1 Following insertion, dressing applied, line sutured and Biopatch. Post procedure assessment: blood return through all ports  Patient tolerated the procedure well with no immediate complications.

## 2022-11-17 NOTE — Progress Notes (Signed)
Dilaudid PCA 10 mls wasted in steri cycle, witnessed by Kirtland Bouchard RN.

## 2022-11-17 NOTE — Anesthesia Procedure Notes (Signed)
Procedure Name: Intubation Date/Time: 11/17/2022 11:19 AM  Performed by: Darryl Nestle, CRNAPre-anesthesia Checklist: Patient identified, Emergency Drugs available, Suction available and Patient being monitored Patient Re-evaluated:Patient Re-evaluated prior to induction Oxygen Delivery Method: Circle system utilized Preoxygenation: Pre-oxygenation with 100% oxygen Induction Type: IV induction Laryngoscope Size: Mac and 4 Grade View: Grade I Tube type: Oral Tube size: 7.5 mm Number of attempts: 1 Airway Equipment and Method: Stylet and Oral airway Placement Confirmation: ETT inserted through vocal cords under direct vision, positive ETCO2 and breath sounds checked- equal and bilateral Secured at: 22 cm Tube secured with: Tape Dental Injury: Teeth and Oropharynx as per pre-operative assessment  Comments: Inserted by Elana Alm SRNA

## 2022-11-17 NOTE — Progress Notes (Addendum)
Patient's CBG >400 at 2324. Trauma MD Byerly paged.  STAT order placed for lab verification per protocol.  Awaiting return page.  Care ongoing.   Harriett Sine, RN

## 2022-11-17 NOTE — Anesthesia Procedure Notes (Signed)
Arterial Line Insertion Start/End5/14/2024 1:00 PM, 11/17/2022 1:15 PM Performed by: Leonides Grills, MD, Darryl Nestle, CRNA, CRNA  Patient location: Pre-op. Preanesthetic checklist: patient identified, IV checked, site marked, risks and benefits discussed, surgical consent, monitors and equipment checked, pre-op evaluation, timeout performed and anesthesia consent Lidocaine 1% used for infiltration Left, radial was placed Catheter size: 20 G Hand hygiene performed  and maximum sterile barriers used   Attempts: 2 Procedure performed using ultrasound guided technique. Ultrasound Notes:anatomy identified, needle tip was noted to be adjacent to the nerve/plexus identified and no ultrasound evidence of intravascular and/or intraneural injection Following insertion, dressing applied and Biopatch. Post procedure assessment: normal and unchanged  Patient tolerated the procedure well with no immediate complications.

## 2022-11-17 NOTE — Anesthesia Preprocedure Evaluation (Signed)
Anesthesia Evaluation  Patient identified by MRN, date of birth, ID band Patient awake    Reviewed: Allergy & Precautions, NPO status , Patient's Chart, lab work & pertinent test results  Airway Mallampati: II  TM Distance: >3 FB Neck ROM: Full    Dental  (+) Edentulous Upper, Edentulous Lower   Pulmonary sleep apnea , COPD, Current Smoker and Patient abstained from smoking.   Pulmonary exam normal        Cardiovascular hypertension, Normal cardiovascular exam     Neuro/Psych  PSYCHIATRIC DISORDERS Anxiety Depression Bipolar Disorder Schizophrenia   Neuromuscular disease    GI/Hepatic negative GI ROS, Neg liver ROS,,,  Endo/Other  diabetes, Insulin Dependent    Renal/GU Renal disease     Musculoskeletal negative musculoskeletal ROS (+)    Abdominal   Peds  Hematology negative hematology ROS (+)   Anesthesia Other Findings Pancreatic pseudocyst  Reproductive/Obstetrics                             Anesthesia Physical Anesthesia Plan  ASA: 3  Anesthesia Plan: General   Post-op Pain Management:    Induction: Intravenous  PONV Risk Score and Plan:   Airway Management Planned: Oral ETT  Additional Equipment: Arterial line  Intra-op Plan:   Post-operative Plan: Extubation in OR  Informed Consent: I have reviewed the patients History and Physical, chart, labs and discussed the procedure including the risks, benefits and alternatives for the proposed anesthesia with the patient or authorized representative who has indicated his/her understanding and acceptance.     Dental advisory given  Plan Discussed with: CRNA  Anesthesia Plan Comments:        Anesthesia Quick Evaluation

## 2022-11-17 NOTE — Transfer of Care (Signed)
Immediate Anesthesia Transfer of Care Note  Patient: Steve Romero  Procedure(s) Performed: OPEN DISTAL PANCREATECTOMY SPLENECTOMY  Patient Location: PACU  Anesthesia Type:General  Level of Consciousness: awake, alert , and oriented  Airway & Oxygen Therapy: Patient Spontanous Breathing  Post-op Assessment: Report given to RN and Post -op Vital signs reviewed and stable  Post vital signs: Reviewed and stable  Last Vitals:  Vitals Value Taken Time  BP 100/69 11/17/22 1756  Temp 36.6 C 11/17/22 1740  Pulse 91 11/17/22 1759  Resp 13 11/17/22 1759  SpO2 96 % 11/17/22 1759  Vitals shown include unvalidated device data.  Last Pain:  Vitals:   11/17/22 1740  TempSrc:   PainSc: 10-Worst pain ever      Patients Stated Pain Goal: 2 (11/17/22 1740)  Complications: No notable events documented.

## 2022-11-17 NOTE — Progress Notes (Signed)
   Subjective/Chief Complaint: No acute changes. On PCA for pain control. Denies nausea/vomiting.   Objective: Vital signs in last 24 hours: Temp:  [97.3 F (36.3 C)-98.6 F (37 C)] 97.8 F (36.6 C) (05/14 0548) Pulse Rate:  [47-56] 54 (05/14 0548) Resp:  [15-20] 16 (05/14 0400) BP: (112-140)/(82-88) 131/84 (05/14 0548) SpO2:  [92 %-100 %] 99 % (05/14 0548) FiO2 (%):  [0 %] 0 % (05/13 2146) Weight:  [99.5 kg] 99.5 kg (05/14 0500) Last BM Date : 11/14/22  Intake/Output from previous day: 05/13 0701 - 05/14 0700 In: 517.7 [I.V.:517.7] Out: -  Intake/Output this shift: No intake/output data recorded.  General appearance: alert and cooperative Resp: clear to auscultation bilaterally Cardio: regular rate and rhythm GI: soft, nondistended, nontender to palpation. Well-healed port site surgical scars.  Lab Results:  Recent Labs    11/16/22 0440 11/17/22 0522  WBC 6.7 6.5  HGB 13.6 14.1  HCT 39.8 41.0  PLT 218 209   BMET Recent Labs    11/16/22 0440 11/17/22 0522  NA 134* 138  K 3.6 3.9  CL 102 102  CO2 26 27  GLUCOSE 147* 197*  BUN 11 11  CREATININE 0.73 0.82  CALCIUM 8.1* 8.8*   PT/INR No results for input(s): "LABPROT", "INR" in the last 72 hours.  ABG No results for input(s): "PHART", "HCO3" in the last 72 hours.  Invalid input(s): "PCO2", "PO2"  Studies/Results: No results found.     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 - OR today for open distal pancreatectomy with splenectomy. - Plan to administer post-splenectomy vaccines prior to discharge. - NPO, continue TPN - Type and cross completed - Surgical details were reviewed including the benefits and risks of bleeding, infection, and pancreatic fistula. Patient expressed understanding and agrees to proceed with surgery.     LOS: 5 days    Sophronia Simas, MD [facility]  Surgery General, Hepatobiliary and Pancreatic Surgery 11/17/22 8:21 AM

## 2022-11-17 NOTE — Op Note (Signed)
Date: 11/17/22  Patient: Steve Romero MRN: [id number]  Preoperative Diagnosis: Recurrent pancreatic pseudocyst Postoperative Diagnosis: Same  Procedure:  Open distal pancreatectomy with splenectomy Gastrorraphy Intraoperative ultrasound  Surgeon: Sophronia Simas, MD Assistant: Axel Filler, MD  EBL: 2000 mL  Anesthesia: General endotracheal  Specimens: Distal pancreas and spleen  Indications: Steve Romero is a 52 yo male who developed post-ERCP necrotizing pancreatitis two years ago. He subsequently developed disconnected duct in the body of the pancreas, with a recurrent pseudocyst. This has recurred after three endoscopic cystgastrostomies. After an extensive discussion of the surgical options and risks and benefits of each, he agreed to proceed with a distal pancreatectomy.  Findings: Pseudocyst within the body of the pancreas. Tail of the pancreas was very firm, consistent with previous pancreatitis.  Procedure details: Informed consent was obtained in the preoperative area prior to the procedure. The patient was brought to the operating room and placed on the table in the supine position. General anesthesia was induced and appropriate lines and drains were placed for intraoperative monitoring. Perioperative antibiotics were administered per SCIP guidelines. The abdomen was prepped and draped in the usual sterile fashion. A pre-procedure timeout was taken verifying patient identity, surgical site and procedure to be performed.  An upper midline skin incision was made and the subcutaneous tissue was divided with cautery to expose the fascia. The fascia was elevated and opened at midline with cautery and the peritoneum was opened. The falciform ligament was taken down off the abdominal wall and divided with Ligasure. An Allexis wound protector and Bookwalter fixed retractor were placed. The stomach was grasped and elevated with a Babcock clamp and the gastrocolic omentum was divided  with Ligasure to enter the lesser sac. There were significant adhesions in the lesser sac from prior pancreatitis. The stomach was densely adherent to the entire pancreas. Intraoperative [place] was performed of the pancreas, confirming the presence of a thick-walled cyst in the neck and body of the pancreas. The SMV was deep to this cyst. The adhesions between the pancreas and stomach were taken down using blunt dissection and cautery. There was venous bleeding from the lesser curve of the stomach, which was controlled with placement of 3-0 prolene figure-of-eight sutures. The splenic flexure of the colon was mobilized using blunt dissection and Ligasure. The remaining splenic attachments were mobilized using blunt manual dissection. A plane was then bluntly created between the tail of the pancreas and the retroperitoneum. There were dense adhesions, which were divided with cautery. The middle colic vessels were visualized, leading into the SMV along the inferior border of the pancreas. This appeared to be deep to the pseudocyst. Once the spleen and tail of the pancreas were mobilized, ultrasound was again used to confirm the location of the cyst. The decision was made to open the cyst to help delineate the pancreatic transection point. Cautery was then used to open the cyst. There was not a clear plane initially between the cyst wall and the posterior stomach, and two gastrotomies were inadvertently created on the posterior stomach, each measuring about 2cm in length. The cyst was then opened with cautery. The wall was very thick, and the cyst contained clear brown fluid which was completely evacuated. The cyst was then digitally probed, and the wall was further opened toward the left side of the pancreas. The SMV was again visualized running just deep to the lateral surface of the cyst. The tail of the pancreas was completely mobilized up to this point. The splenic vein was visualized  on the posterior surface of the  pancreas, and was circumferentially dissected out using blunt dissection. The splenic vein was divided with a white load of a 60mm Echelon stapler. The superior border of the pancreas was palpated, but the splenic artery could not be palpated within the thick, firm pancreatic parenchyma. The pancreas was too thick to be divided with the stapler. The parenchyma was thus divided with cautery just to the left of the cyst wall and the SMV. The splenic artery was then visualized on the superior border of the pancreas, and was divided with a 60mm Echelon stapler with a white load. The specimen was passed off the field and sent for routine pathology. There was some bleeding from the splenic artery stump, which was oversewn with a running 3-0 Prolene suture. The abdomen was irrigated with warmed saline. There was some bleeding on the surface of the retroperitoneum, which was controlled with Argon. Ultrasound was again performed and confirmed that the pseudocyst had been completely evacuated. The more posterior gastrotomy, adjacent to the pseudocyst cavity, was closed with an inner layer of interrupted 3-0 Vicryl suture and an outer later of 3-0 silk Lembert sutures. The remaining gastrotomy was more anterior and was closed with an inner layer of running 3-0 PDS and an outer layer of 3-0 silk Lembert sutures. Confirmation of the NG tube tip within the stomach was confirmed. The abdomen was again irrigated and appeared hemostatic. Vistaseal was placed on the retroperitoneum and splenic artery stump. A 19-Fr JP drain was placed adjacent to the remaining pancreas, with the tip within the pseudocyst cavity, and was run through the splenic fossa and brought out through the left lateral abdominal wall. It was secured to the skin with 2-0 Nylon suture. The wound protector and retractors were removed. The fascia was closed with running looped 1 PDS suture, and the skin was closed with staples. A sterile dressing was applied.  The  patient tolerated the procedure with no apparent complications. All counts were correct x2 at the end of the procedure. The patient was extubated and taken to PACU in stable condition.  Sophronia Simas, MD 11/17/22 5:40 PM

## 2022-11-17 NOTE — Progress Notes (Signed)
Pt going to surgery. Wasted 10 ml PCA Dilaudid from the PCA pump with Mardella Layman, Charity fundraiser.

## 2022-11-18 ENCOUNTER — Encounter (HOSPITAL_COMMUNITY): Payer: Self-pay | Admitting: Surgery

## 2022-11-18 DIAGNOSIS — K863 Pseudocyst of pancreas: Secondary | ICD-10-CM | POA: Diagnosis not present

## 2022-11-18 LAB — POCT I-STAT 7, (LYTES, BLD GAS, ICA,H+H)
Acid-base deficit: 4 mmol/L — ABNORMAL HIGH (ref 0.0–2.0)
Acid-base deficit: 5 mmol/L — ABNORMAL HIGH (ref 0.0–2.0)
Bicarbonate: 23 mmol/L (ref 20.0–28.0)
Bicarbonate: 22.2 mmol/L (ref 20.0–28.0)
Calcium, Ion: 1.15 mmol/L (ref 1.15–1.40)
Calcium, Ion: 1.13 mmol/L — ABNORMAL LOW (ref 1.15–1.40)
HCT: 30 % — ABNORMAL LOW (ref 39.0–52.0)
HCT: 31 % — ABNORMAL LOW (ref 39.0–52.0)
Hemoglobin: 10.2 g/dL — ABNORMAL LOW (ref 13.0–17.0)
Hemoglobin: 10.5 g/dL — ABNORMAL LOW (ref 13.0–17.0)
O2 Saturation: 99 %
O2 Saturation: 100 %
Patient temperature: 35.5
Patient temperature: 35.7
Potassium: 3.7 mmol/L (ref 3.5–5.1)
Potassium: 4.1 mmol/L (ref 3.5–5.1)
Sodium: 139 mmol/L (ref 135–145)
Sodium: 139 mmol/L (ref 135–145)
TCO2: 24 mmol/L (ref 22–32)
TCO2: 24 mmol/L (ref 22–32)
pCO2 arterial: 46.9 mmHg (ref 32–48)
pCO2 arterial: 44.9 mmHg (ref 32–48)
pH, Arterial: 7.29 — ABNORMAL LOW (ref 7.35–7.45)
pH, Arterial: 7.295 — ABNORMAL LOW (ref 7.35–7.45)
pO2, Arterial: 128 mmHg — ABNORMAL HIGH (ref 83–108)
pO2, Arterial: 396 mmHg — ABNORMAL HIGH (ref 83–108)

## 2022-11-18 LAB — COMPREHENSIVE METABOLIC PANEL
ALT: 27 U/L (ref 0–44)
AST: 15 U/L (ref 15–41)
Albumin: 2.5 g/dL — ABNORMAL LOW (ref 3.5–5.0)
Alkaline Phosphatase: 127 U/L — ABNORMAL HIGH (ref 38–126)
Anion gap: 9 (ref 5–15)
BUN: 22 mg/dL — ABNORMAL HIGH (ref 6–20)
CO2: 21 mmol/L — ABNORMAL LOW (ref 22–32)
Calcium: 7.7 mg/dL — ABNORMAL LOW (ref 8.9–10.3)
Chloride: 105 mmol/L (ref 98–111)
Creatinine, Ser: 1.09 mg/dL (ref 0.61–1.24)
GFR, Estimated: >60 mL/min (ref >60)
Glucose, Bld: 382 mg/dL — ABNORMAL HIGH (ref 70–99)
Potassium: 4.4 mmol/L (ref 3.5–5.1)
Sodium: 135 mmol/L (ref 135–145)
Total Bilirubin: 0.6 mg/dL (ref 0.3–1.2)
Total Protein: 4.5 g/dL — ABNORMAL LOW (ref 6.5–8.1)

## 2022-11-18 LAB — BPAM RBC
Blood Product Expiration Date: 202406042359
ISSUE DATE / TIME: 202405141408
Unit Type and Rh: 7300
Unit Type and Rh: 7300
Unit Type and Rh: 7300

## 2022-11-18 LAB — GLUCOSE, CAPILLARY
Glucose-Capillary: 399 mg/dL — ABNORMAL HIGH (ref 70–99)
Glucose-Capillary: 312 mg/dL — ABNORMAL HIGH (ref 70–99)
Glucose-Capillary: 183 mg/dL — ABNORMAL HIGH (ref 70–99)
Glucose-Capillary: 80 mg/dL (ref 70–99)
Glucose-Capillary: 172 mg/dL — ABNORMAL HIGH (ref 70–99)

## 2022-11-18 LAB — TYPE AND SCREEN
ABO/RH(D): B NEG
Unit division: 0

## 2022-11-18 LAB — CBC
HCT: 31.5 % — ABNORMAL LOW (ref 39.0–52.0)
Hemoglobin: 10.9 g/dL — ABNORMAL LOW (ref 13.0–17.0)
MCH: 29.6 pg (ref 26.0–34.0)
MCHC: 34.6 g/dL (ref 30.0–36.0)
MCV: 85.6 fL (ref 80.0–100.0)
Platelets: 199 10*3/uL (ref 150–400)
RBC: 3.68 MIL/uL — ABNORMAL LOW (ref 4.22–5.81)
RDW: 13.1 % (ref 11.5–15.5)
WBC: 11.6 10*3/uL — ABNORMAL HIGH (ref 4.0–10.5)
nRBC: 0 % (ref 0.0–0.2)

## 2022-11-18 LAB — DIFFERENTIAL
Abs Immature Granulocytes: 0.04 10*3/uL (ref 0.00–0.07)
Basophils Absolute: 0 10*3/uL (ref 0.0–0.1)
Basophils Relative: 0 %
Eosinophils Absolute: 0 10*3/uL (ref 0.0–0.5)
Eosinophils Relative: 0 %
Immature Granulocytes: 0 %
Lymphocytes Relative: 5 %
Lymphs Abs: 0.6 10*3/uL — ABNORMAL LOW (ref 0.7–4.0)
Monocytes Absolute: 1 10*3/uL (ref 0.1–1.0)
Monocytes Relative: 9 %
Neutro Abs: 9.9 10*3/uL — ABNORMAL HIGH (ref 1.7–7.7)
Neutrophils Relative %: 86 %

## 2022-11-18 LAB — PHOSPHORUS: Phosphorus: 4.1 mg/dL (ref 2.5–4.6)

## 2022-11-18 LAB — MAGNESIUM: Magnesium: 1.8 mg/dL (ref 1.7–2.4)

## 2022-11-18 LAB — GLUCOSE, RANDOM: Glucose, Bld: 406 mg/dL — ABNORMAL HIGH (ref 70–99)

## 2022-11-18 MED ORDER — INSULIN GLARGINE-YFGN 100 UNIT/ML ~~LOC~~ SOLN
8.0000 [IU] | Freq: Every day | SUBCUTANEOUS | Status: DC
  Administered 2022-11-18: 8 [IU] via SUBCUTANEOUS
  Filled 2022-11-18 (×2): qty 0.08

## 2022-11-18 MED ORDER — INSULIN GLARGINE-YFGN 100 UNIT/ML ~~LOC~~ SOLN
10.0000 [IU] | Freq: Every day | SUBCUTANEOUS | Status: DC
  Administered 2022-11-18: 10 [IU] via SUBCUTANEOUS
  Filled 2022-11-18 (×4): qty 0.1

## 2022-11-18 MED ORDER — TRAVASOL 10 % IV SOLN
INTRAVENOUS | Status: AC
  Filled 2022-11-18: qty 1188

## 2022-11-18 NOTE — Progress Notes (Addendum)
PHARMACY - TOTAL PARENTERAL NUTRITION CONSULT NOTE  Indication: Severe pancreatitic pseudocyst with GOO  Patient Measurements: Height: 5\' 10"  (177.8 cm) Weight: 91.4 kg (201 lb 8 oz) IBW/kg (Calculated) : 73 TPN AdjBW (KG): 77.7 Body mass index is 28.91 kg/m.  Assessment:  72 yoM with a history of post-ERCP necrotizing pancreatitis.  CT scan shows enlargement of the pancreatic pseudocyst.  Early satiety and vomiting are presumably due to GOO secondary to mass effect of the cyst.  Attempted placement of small bore NJT on 5/10, but was positive for CO2 (placed in lung) and removed, patient refused further attempt.  Pharmacy has been consulted to dose TPN.    Glucose / Insulin: hx DM; A1c 6.8% (07/21/22), Toujeo 5 units BID PTA.  Inpatient started on Semglee 5 units QHS (last given on 5/10, then d/c) CBGs elevated, likely d/t stress response and from Decadron 10mg  on 5/14 PM - used 22 units SSI in past 24 hrs Electrolytes: all WNL (K/Phos trending up, Na/Mag low normal) Renal: SCr < 1, BUN WNL Hepatic: alk phos mildly elevated, AST / AST / tbili / TG WNL, albumin 2.5, lipase 108 Intake / Output; MIVF: UOP 0.8 ml/kg/hr, drain , LR at 30 ml/hr GI Imaging:  5/9 CT: enlarging fluid collection c/w pancreatic psuedocyst  GI Surgeries / Procedures:  5/14 open distal pancreatectomy and splenectomy   Central access: PICC placed 11/14/22 TPN start date: 11/14/22  Nutritional Goals:  RD Estimated Needs Total Energy Estimated Needs: 2200-2400 Total Protein Estimated Needs: 110-125g Total Fluid Estimated Needs: 2.2L/day  Current Nutrition:  TPN  Plan:  Continue TPN at goal rate of 90 ml/hr to provide 119g AA, 346g CHO, 65g ILE and 2298 kCal, meeting 100% of needs Electrolytes in TPN: increase Na to 120 mEq/L, reduce K to 84mEq/L, Ca 36mEq/L, increase Mg to 68mEq/L, reduce Phos to 48mmol/L, change Cl:Ac 1:2 Add standard MVI and trace elements to TPN Continue resistant SSI Q4H - anticipate  post-op hyperglycemia to improve soon.  Semglee increased to 10 units SQ daily per MD. LR at 30 ml/hr per MD Monitor TPN labs on Mon/Thurs (CMP, Mag and Phos daily through 5/15 already ordered)   Steve Romero, PharmD, BCPS, BCCCP 11/18/2022, 9:51 AM

## 2022-11-18 NOTE — Hospital Course (Signed)
PMH of type II DM, bipolar disorder, chronic pancreatitis with known pseudocyst, COPD presented to the hospital with complaints of abdominal pain was found to have acute on chronic pancreatitis with recurrent pancreatic pseudocyst.  GI and general surgery were consulted. Underwent open distal pancreatectomy with splenectomy and gastrorrhaphy on 5/14.

## 2022-11-18 NOTE — Inpatient Diabetes Management (Signed)
Inpatient Diabetes Program Recommendations  AACE/ADA: New Consensus Statement on Inpatient Glycemic Control (2015)  Target Ranges:  Prepandial:   less than 140 mg/dL      Peak postprandial:   less than 180 mg/dL (1-2 hours)      Critically ill patients:  140 - 180 mg/dL   Lab Results  Component Value Date   GLUCAP 312 (H) 11/18/2022   HGBA1C 7.7 (H) 09/25/2022    Review of Glycemic Control  Diabetes history: DM 2 Outpatient Diabetes medications: toujeo 5 units bid, FSL 3 Current orders for Inpatient glycemic control:  Semglee 10 units qhs Novolog 0-20 units Q4  TPN no insulin currently added. Dose per pharmacy  Inpatient Diabetes Program Recommendations:    Pt received Decadron 10 mg yesterday  -  Add Novolog 4 units tid meal coverage if eating >50% of meals  Will need to titrate tomorrow  Thanks,  Christena Deem RN, MSN, BC-ADM Inpatient Diabetes Coordinator Team Pager [phone] (8a-5p)

## 2022-11-18 NOTE — Progress Notes (Signed)
[facility] Consultation Progress Note  Patient: Steve Romero ZOX:[id number]   PCP: Raliegh Ip, DO DOB: 1971-03-04   DOA: 11/12/2022   DOS: 11/18/2022   Date of Service: the patient was seen and examined on 11/18/2022 Primary service: Fritzi Mandes, MD   Brief hospital course: PMH of type II DM, bipolar disorder, chronic pancreatitis with known pseudocyst, COPD presented to the hospital with complaints of abdominal pain was found to have acute on chronic pancreatitis with recurrent pancreatic pseudocyst.  GI and general surgery were consulted. Underwent open distal pancreatectomy with splenectomy and gastrorrhaphy on 5/14.  Assessment and Plan: Acute on chronic pancreatitis with recurrent pancreatic pseudocyst. Presents with abdominal pain. Lipase was 152. CT scan shows evidence of pancreatic pseudocyst up to 6.3 cm in size. GI was consulted.  Hepatobiliary surgery Dr. Clifton Custard was also consulted. Originally admitted to [facility] but transferred to [facility] for surgical intervention. On 5/14 underwent distal pancreatectomy and splenectomy. Currently NPO. On TPN. Pain control and management per surgery. Appreciate surgical intervention.  Patient currently under surgical care.  Type 2 diabetes mellitus, uncontrolled with hyperglycemia with long-term insulin use without any complication. Uses Toujeo at home. Blood sugars were originally low but gradually trended up with TPN progression. Received IV Decadron during the surgery and receiving Robaxin with dextrose. Therefore blood sugar was significantly elevated. Currently on Semglee and sliding scale insulin Will monitor.  COPD. Currently stable.  No evidence of exacerbation. Continue Dulera and nebulizations.  We will continue to follow the patient.   Subjective: Abdominal pain well-controlled.  No nausea no vomiting no fever no chills.   Objective: Vitals:   11/18/22 1217 11/18/22 1255 11/18/22 1553 11/18/22 1604   BP:  124/74 127/75   Pulse:  93 81   Resp: 15 19 17  (!) 22  Temp:  97.7 F (36.5 C) 97.9 F (36.6 C)   TempSrc:  Oral Oral   SpO2: 93% 97% 97% 98%  Weight:      Height:        General: in Mild distress, No Rash Cardiovascular: S1 and S2 Present, No Murmur Respiratory: Good respiratory effort, Bilateral Air entry present. No Crackles, No wheezes Abdomen: Bowel Sound present, mild diffuse tenderness Extremities: No edema Neuro: Alert and oriented x3, no new focal deficit   Family Communication: Family at bedside  Data Reviewed: Since last encounter, pertinent lab results CBC and BMP   . I have ordered test including CBC and BMP  . I have discussed pt's care plan and test results with general surgery  .   Author: Lynden Oxford, MD 11/18/2022 7:25 PM  To reach On-call, see care teams to locate the attending and reach out to them via [email or web address]. If 7PM-7AM, please contact night-coverage If you still have difficulty reaching the attending provider, please page the [facility] (Director on Call) for [facility] on amion for assistance.

## 2022-11-18 NOTE — Progress Notes (Signed)
1 Day Post-Op  Subjective: No acute issues overnight. Weaned off neo postop, hemodynamically stable. Pain well-controlled, denies nausea/vomiting.   Objective: Vital signs in last 24 hours: Temp:  [96.9 F (36.1 C)-98.7 F (37.1 C)] 97.9 F (36.6 C) (05/15 0800) Pulse Rate:  [57-127] 88 (05/15 0813) Resp:  [10-20] 14 (05/15 0813) BP: (81-155)/(59-98) 122/72 (05/15 0700) SpO2:  [92 %-99 %] 94 % (05/15 0700) Arterial Line BP: (80-159)/(45-77) 129/61 (05/15 0700) FiO2 (%):  [0 %] 0 % (05/15 0828) Weight:  [91.4 kg-91.6 kg] 91.4 kg (05/15 0500) Last BM Date : 11/11/22  Intake/Output from previous day: 05/14 0701 - 05/15 0700 In: 7295.2 [I.V.:5059.7; Blood:315; IV Piggyback:1920.5] Out: 4895 [Urine:1675; Drains:270; Blood:2250] Intake/Output this shift: No intake/output data recorded.  PE: General: resting comfortably, NAD Neuro: alert and oriented, no focal deficits HEENT: NG in place Resp: normal work of breathing CV: RRR Abdomen: soft, nondistended, nontender to palpation. Midline incision clean and dry with no erythema or induration. Staples in place, honeycomb dressing is clean. Extremities: warm and well-perfused GU: foley draining clear yellow urine   Lab Results:  Recent Labs    11/17/22 1800 11/18/22 0521  WBC 13.6* 11.6*  HGB 11.3* 10.9*  HCT 33.0* 31.5*  PLT 183 199   BMET Recent Labs    11/17/22 1800 11/17/22 2359 11/18/22 0521  NA 137  --  135  K 3.6  --  4.4  CL 110  --  105  CO2 19*  --  21*  GLUCOSE 223* 406* 382*  BUN 18  --  22*  CREATININE 1.05  --  1.09  CALCIUM 7.0*  --  7.7*   PT/INR No results for input(s): "LABPROT", "INR" in the last 72 hours. CMP     Component Value Date/Time   NA 135 11/18/2022 0521   NA 139 09/25/2022 0837   K 4.4 11/18/2022 0521   CL 105 11/18/2022 0521   CO2 21 (L) 11/18/2022 0521   GLUCOSE 382 (H) 11/18/2022 0521   BUN 22 (H) 11/18/2022 0521   BUN 17 09/25/2022 0837   CREATININE 1.09  11/18/2022 0521   CREATININE 0.55 (L) 01/13/2021 1109   CALCIUM 7.7 (L) 11/18/2022 0521   PROT 4.5 (L) 11/18/2022 0521   PROT 6.3 10/14/2022 0816   ALBUMIN 2.5 (L) 11/18/2022 0521   ALBUMIN 4.3 10/14/2022 0816   AST 15 11/18/2022 0521   ALT 27 11/18/2022 0521   ALKPHOS 127 (H) 11/18/2022 0521   BILITOT 0.6 11/18/2022 0521   BILITOT 0.5 10/14/2022 0816   GFRNONAA >60 11/18/2022 0521   GFRNONAA 89 04/22/2015 0828   GFRAA 85 10/04/2019 1433   GFRAA >89 04/22/2015 0828   Lipase     Component Value Date/Time   LIPASE 108 (H) 11/16/2022 0440       Studies/Results: No results found.      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 POD1 s/p open distal pancreatectomy with splenectomy and gastrorrhaphy x2. - Keep NPO except ice chips, NG to LIS - Continue TPN - Remove foley and R IJ central line - Pain control: scheduled IV tylenol and robaxin, PCA - Hyperglycemia: on sliding scale, Semglee added overnight. Appreciate hospitalist assistance. - COPD: Dulera, Incruse and duonebs per hospitalist.  - Mobilize, PT ordered - Check drain amylase on POD1 and 3 - VTE: lovenox, SCDs - Dispo: currently in ICU. Will discuss hyperglycemia with hospitalist. If need for insulin gtt, remain  in ICU, otherwise plan to transfer to med-surg floor.    LOS: 6 days    Sophronia Simas, MD [facility] Surgery General, Hepatobiliary and Pancreatic Surgery 11/18/22 8:35 AM

## 2022-11-18 NOTE — Evaluation (Signed)
Physical Therapy Evaluation Patient Details Name: Steve Romero MRN: [id number] DOB: 1971/05/24 Today's Date: 11/18/2022  History of Present Illness  Pt is a 52 y/o M admitted on 11/12/22 after presenting with c/o recurrent severe abdominal pain & concern for recurrent pancreatitis. Pt is s/p open distal pancreatectomy with splenectomy & gastrorrhaphy x 2. PMH: DM2, bipolar disorder, chronic pancreatitis, pancreatic pseudocyst, AKI, anxiety, ascites, COPD, depression, HLD, HTN, schizoaffective disorder, thyroid disease  Clinical Impression  Pt seen for PT evaluation with pt agreeable. Pt reports prior to admission he was independent without AD, living alone. On this date, pt requires min assist with use of hospital bed features to complete supine>sit, min assist for STS & step pivot bed>recliner. Pt with c/o dizziness upon sitting EOB but BP WNL (see below). Further gait deferred on this date 2/2 pain & fatigue. Pt would benefit from ongoing PT treatment to address strength, balance, & activity tolerance to increase independence & reduce fall risk.  Sitting EOB: BP in LUE 112/70 mmHg MAP 81, HR 89 bpm, SpO2 97% on 2L/min Sitting in recliner: BP in LUE 125/87 mmHg MAP 97 Pt on 2L/min via nasal cannula throughout session.   Recommendations for follow up therapy are one component of a multi-disciplinary discharge planning process, led by the attending physician.  Recommendations may be updated based on patient status, additional functional criteria and insurance authorization.  Follow Up Recommendations       Assistance Recommended at Discharge Intermittent Supervision/Assistance  Patient can return home with the following  A little help with walking and/or transfers;A little help with bathing/dressing/bathroom;Assistance with cooking/housework;Assist for transportation;Help with stairs or ramp for entrance    Equipment Recommendations Rolling walker (2 wheels)  Recommendations for Other  Services  OT consult    Functional Status Assessment Patient has had a recent decline in their functional status and demonstrates the ability to make significant improvements in function in a reasonable and predictable amount of time.     Precautions / Restrictions Precautions Precautions: Fall Precaution Comments: log roll for abdominal comfort, NG tube Restrictions Weight Bearing Restrictions: No      Mobility  Bed Mobility Overal bed mobility: Needs Assistance Bed Mobility: Sidelying to Sit, Rolling Rolling: Min assist Sidelying to sit: Min assist, HOB elevated       General bed mobility comments: cuing for log rolling but would benefit from further education, use of bed rails, HOB elevated    Transfers Overall transfer level: Needs assistance Equipment used: 1 person hand held assist Transfers: Sit to/from Stand, Bed to chair/wheelchair/BSC Sit to Stand: Min assist   Step pivot transfers: Min assist       General transfer comment: STS with HHA, step pivot bed>recliner on L with HHA min assist    Ambulation/Gait                  Stairs            Wheelchair Mobility    Modified Rankin (Stroke Patients Only)       Balance Overall balance assessment: Needs assistance Sitting-balance support: Feet supported Sitting balance-Leahy Scale: Fair Sitting balance - Comments: supervision static sitting   Standing balance support: During functional activity, Single extremity supported, Bilateral upper extremity supported Standing balance-Leahy Scale: Poor                               Pertinent Vitals/Pain Pain Assessment Pain Assessment: 0-10 Pain Score: 6  Pain Location: abdomen, increases with movement Pain Descriptors / Indicators: Discomfort, Grimacing, Guarding Pain Intervention(s): PCA encouraged, Repositioned, Monitored during session, Limited activity within patient's tolerance (pt utillized PCA pump during session)     Home Living Family/patient expects to be discharged to:: Private residence Living Arrangements: Alone Available Help at Discharge: Family;Available PRN/intermittently Type of Home: House Home Access: Stairs to enter   Entrance Stairs-Number of Steps: 1 small threshold step   Home Layout: One level Home Equipment: None      Prior Function Prior Level of Function : Independent/Modified Independent;Driving             Mobility Comments: Independent without AD       Hand Dominance        Extremity/Trunk Assessment   Upper Extremity Assessment Upper Extremity Assessment: Overall WFL for tasks assessed    Lower Extremity Assessment Lower Extremity Assessment: Generalized weakness       Communication   Communication: No difficulties  Cognition Arousal/Alertness: Awake/alert Behavior During Therapy: WFL for tasks assessed/performed Overall Cognitive Status: Within Functional Limits for tasks assessed                                          General Comments      Exercises     Assessment/Plan    PT Assessment Patient needs continued PT services  PT Problem List Decreased strength;Cardiopulmonary status limiting activity;Pain;Decreased activity tolerance;Decreased mobility;Decreased balance;Decreased knowledge of use of DME       PT Treatment Interventions DME instruction;Therapeutic exercise;Balance training;Gait training;Stair training;Functional mobility training;Therapeutic activities;Patient/family education;Neuromuscular re-education    PT Goals (Current goals can be found in the Care Plan section)  Acute Rehab PT Goals Patient Stated Goal: decreased pain, get better PT Goal Formulation: With patient Time For Goal Achievement: 12/02/22 Potential to Achieve Goals: Good    Frequency Min 3X/week     Co-evaluation               AM-PAC PT "6 Clicks" Mobility  Outcome Measure Help needed turning from your back to your side  while in a flat bed without using bedrails?: A Little Help needed moving from lying on your back to sitting on the side of a flat bed without using bedrails?: A Lot Help needed moving to and from a bed to a chair (including a wheelchair)?: A Little Help needed standing up from a chair using your arms (e.g., wheelchair or bedside chair)?: A Little Help needed to walk in hospital room?: A Lot Help needed climbing 3-5 steps with a railing? : Total 6 Click Score: 14    End of Session   Activity Tolerance: Patient limited by pain;Patient limited by fatigue Patient left: in chair;with chair alarm set;with call bell/phone within reach;with family/visitor present Nurse Communication: Mobility status PT Visit Diagnosis: Muscle weakness (generalized) (M62.81);Difficulty in walking, not elsewhere classified (R26.2);Unsteadiness on feet (R26.81)    Time: 1191-4782 PT Time Calculation (min) (ACUTE ONLY): 20 min   Charges:   PT Evaluation $PT Eval Moderate Complexity: 1 Mod          Aleda Grana, PT, DPT 11/18/22, 2:14 PM   Sandi Mariscal 11/18/2022, 2:13 PM

## 2022-11-18 NOTE — Anesthesia Postprocedure Evaluation (Signed)
Anesthesia Post Note  Patient: Steve Romero  Procedure(s) Performed: OPEN DISTAL PANCREATECTOMY SPLENECTOMY     Patient location during evaluation: PACU Anesthesia Type: General Level of consciousness: awake Pain management: pain level controlled Vital Signs Assessment: post-procedure vital signs reviewed and stable Respiratory status: spontaneous breathing, nonlabored ventilation and respiratory function stable Cardiovascular status: blood pressure returned to baseline and stable Postop Assessment: no apparent nausea or vomiting Anesthetic complications: no  No notable events documented.  Last Vitals:  Vitals:   11/18/22 1217 11/18/22 1255  BP:  124/74  Pulse:  93  Resp: 15 19  Temp:  36.5 C  SpO2: 93% 97%    Last Pain:  Vitals:   11/18/22 1347  TempSrc:   PainSc: 5                  Linton Rump

## 2022-11-18 NOTE — Progress Notes (Signed)
Return page from Trauma MD [facility] not received.   MD paged by this RN again at this moment.    Care ongoing.    Harriett Sine, RN

## 2022-11-19 DIAGNOSIS — K863 Pseudocyst of pancreas: Secondary | ICD-10-CM | POA: Diagnosis not present

## 2022-11-19 LAB — CBC
HCT: 27.3 % — ABNORMAL LOW (ref 39.0–52.0)
Hemoglobin: 9.2 g/dL — ABNORMAL LOW (ref 13.0–17.0)
MCH: 30.3 pg (ref 26.0–34.0)
MCHC: 33.7 g/dL (ref 30.0–36.0)
MCV: 89.8 fL (ref 80.0–100.0)
Platelets: 231 10*3/uL (ref 150–400)
RBC: 3.04 MIL/uL — ABNORMAL LOW (ref 4.22–5.81)
RDW: 13.7 % (ref 11.5–15.5)
WBC: 14.6 10*3/uL — ABNORMAL HIGH (ref 4.0–10.5)
nRBC: 0 % (ref 0.0–0.2)

## 2022-11-19 LAB — AMYLASE, BODY FLUID (OTHER): Amylase, Body Fluid: 516 U/L

## 2022-11-19 LAB — PHOSPHORUS: Phosphorus: 3 mg/dL (ref 2.5–4.6)

## 2022-11-19 LAB — COMPREHENSIVE METABOLIC PANEL
ALT: 19 U/L (ref 0–44)
AST: 12 U/L — ABNORMAL LOW (ref 15–41)
Albumin: 2.3 g/dL — ABNORMAL LOW (ref 3.5–5.0)
Alkaline Phosphatase: 114 U/L (ref 38–126)
Anion gap: 8 (ref 5–15)
BUN: 23 mg/dL — ABNORMAL HIGH (ref 6–20)
CO2: 24 mmol/L (ref 22–32)
Calcium: 7.9 mg/dL — ABNORMAL LOW (ref 8.9–10.3)
Chloride: 108 mmol/L (ref 98–111)
Creatinine, Ser: 1.03 mg/dL (ref 0.61–1.24)
GFR, Estimated: >60 mL/min (ref >60)
Glucose, Bld: 169 mg/dL — ABNORMAL HIGH (ref 70–99)
Potassium: 3.8 mmol/L (ref 3.5–5.1)
Sodium: 140 mmol/L (ref 135–145)
Total Bilirubin: 0.3 mg/dL (ref 0.3–1.2)
Total Protein: 4.6 g/dL — ABNORMAL LOW (ref 6.5–8.1)

## 2022-11-19 LAB — GLUCOSE, CAPILLARY
Glucose-Capillary: 156 mg/dL — ABNORMAL HIGH (ref 70–99)
Glucose-Capillary: 160 mg/dL — ABNORMAL HIGH (ref 70–99)
Glucose-Capillary: 175 mg/dL — ABNORMAL HIGH (ref 70–99)
Glucose-Capillary: 196 mg/dL — ABNORMAL HIGH (ref 70–99)
Glucose-Capillary: 251 mg/dL — ABNORMAL HIGH (ref 70–99)
Glucose-Capillary: 251 mg/dL — ABNORMAL HIGH (ref 70–99)
Glucose-Capillary: 301 mg/dL — ABNORMAL HIGH (ref 70–99)

## 2022-11-19 LAB — MAGNESIUM: Magnesium: 2 mg/dL (ref 1.7–2.4)

## 2022-11-19 MED ORDER — INSULIN GLARGINE-YFGN 100 UNIT/ML ~~LOC~~ SOLN
12.0000 [IU] | Freq: Every day | SUBCUTANEOUS | Status: DC
  Administered 2022-11-19: 12 [IU] via SUBCUTANEOUS
  Filled 2022-11-19 (×2): qty 0.12

## 2022-11-19 MED ORDER — ACETAMINOPHEN 500 MG PO TABS
1000.0000 mg | ORAL_TABLET | Freq: Three times a day (TID) | ORAL | Status: DC
  Administered 2022-11-19 – 2022-11-23 (×13): 1000 mg via ORAL
  Filled 2022-11-19 (×13): qty 2

## 2022-11-19 MED ORDER — DEXTROSE-SODIUM CHLORIDE 5-0.9 % IV SOLN
INTRAVENOUS | Status: AC
  Filled 2022-11-19: qty 1000

## 2022-11-19 MED ORDER — TRAVASOL 10 % IV SOLN
INTRAVENOUS | Status: AC
  Filled 2022-11-19: qty 1188

## 2022-11-19 NOTE — Evaluation (Signed)
Occupational Therapy Evaluation Patient Details Name: Steve Romero MRN: [id number] DOB: 1971-06-03 Today's Date: 11/19/2022   History of Present Illness Pt is a 52 y/o M admitted on 11/12/22 after presenting with c/o recurrent severe abdominal pain & concern for recurrent pancreatitis. Pt is s/p open distal pancreatectomy with splenectomy & gastrorrhaphy x 2. PMH: DM2, bipolar disorder, chronic pancreatitis, pancreatic pseudocyst, AKI, anxiety, ascites, COPD, depression, HLD, HTN, schizoaffective disorder, thyroid disease   Clinical Impression   PTA, pt lived alone and was mod I. Upon eval, pt performing UB ADL with set-up and LB ADL with up to min A. Pt with pain, decreased activity tolerance, and fatigue after SPT to recliner. VSS throughout session. Pt motivated to return to PLOF stating "I will stay in the chair all day if it will help". Pt to continue to benefit from skilled acute Ot to optimize functional independence and endurance. Recommending continued OT in natural setting at discharge.      Recommendations for follow up therapy are one component of a multi-disciplinary discharge planning process, led by the attending physician.  Recommendations may be updated based on patient status, additional functional criteria and insurance authorization.   Assistance Recommended at Discharge Intermittent Supervision/Assistance  Patient can return home with the following A little help with walking and/or transfers;A little help with bathing/dressing/bathroom;Assistance with cooking/housework;Assist for transportation;Help with stairs or ramp for entrance    Functional Status Assessment  Patient has had a recent decline in their functional status and demonstrates the ability to make significant improvements in function in a reasonable and predictable amount of time.  Equipment Recommendations  BSC/3in1;Other (comment) (RW)    Recommendations for Other Services       Precautions / Restrictions  Precautions Precautions: Fall Precaution Comments: log roll for abdominal comfort Restrictions Weight Bearing Restrictions: No      Mobility Bed Mobility Overal bed mobility: Needs Assistance Bed Mobility: Supine to Sit     Supine to sit: Supervision, HOB elevated     General bed mobility comments: Pt HOB elevated on arrival and sitting up into long sit position before he could be cued to use log roll. Able to do so with supervision    Transfers Overall transfer level: Needs assistance Equipment used: 1 person hand held assist Transfers: Sit to/from Stand, Bed to chair/wheelchair/BSC Sit to Stand: Min assist     Step pivot transfers: Min guard, Min assist     General transfer comment: Min A HHA on the L      Balance Overall balance assessment: Needs assistance Sitting-balance support: Feet supported Sitting balance-Leahy Scale: Fair Sitting balance - Comments: supervision static sitting   Standing balance support: During functional activity, Single extremity supported, Bilateral upper extremity supported Standing balance-Leahy Scale: Poor                             ADL either performed or assessed with clinical judgement   ADL Overall ADL's : Needs assistance/impaired Eating/Feeding: Modified independent;Sitting Eating/Feeding Details (indicate cue type and reason): on clear liquids; driking water from cup at bedsinde Grooming: Set up;Sitting   Upper Body Bathing: Set up;Sitting   Lower Body Bathing: Minimal assistance;Sit to/from stand   Upper Body Dressing : Set up;Sitting   Lower Body Dressing: Set up;Sitting/lateral leans Lower Body Dressing Details (indicate cue type and reason): able to don socks while long sitting in bed by bringing LE up in modified figure 4 position Toilet Transfer: Minimal assistance;Stand-pivot  Toilet Transfer Details (indicate cue type and reason): for initial rise         Functional mobility during ADLs: Min  guard;Minimal assistance General ADL Comments: for SPT. SATS >90 and HR up to 98 with SPT. Poor activity tolerance     Vision Baseline Vision/History: 0 No visual deficits Ability to See in Adequate Light: 0 Adequate Patient Visual Report: No change from baseline Vision Assessment?: No apparent visual deficits     Perception Perception Perception Tested?: No   Praxis Praxis Praxis tested?: Not tested    Pertinent Vitals/Pain Pain Assessment Pain Assessment: Faces Faces Pain Scale: Hurts even more Pain Location: abdomen, increases with movement Pain Descriptors / Indicators: Discomfort, Grimacing, Guarding Pain Intervention(s): Limited activity within patient's tolerance, Monitored during session, Other (comment) (Pt using PCA during session)     Hand Dominance Right   Extremity/Trunk Assessment Upper Extremity Assessment Upper Extremity Assessment: Overall WFL for tasks assessed (4+/5; using BUE functionally)   Lower Extremity Assessment Lower Extremity Assessment: Defer to PT evaluation       Communication Communication Communication: No difficulties   Cognition Arousal/Alertness: Awake/alert Behavior During Therapy: WFL for tasks assessed/performed Overall Cognitive Status: Within Functional Limits for tasks assessed                                 General Comments: Initially slow processing due to in light sleep on arrival, but improving with movement. Passing short blessed test with score of 2, able to perform simple money management, etc.     General Comments  VSS    Exercises     Shoulder Instructions      Home Living Family/patient expects to be discharged to:: Private residence Living Arrangements: Alone Available Help at Discharge: Family;Available PRN/intermittently (Reports father likely able to be available 24/7 but father canot physically assist. mother can provide more assist, but has work during the day) Type of Home: House Home  Access: Stairs to enter [employer] of Steps: 1 small threshold step   Home Layout: One level     Bathroom Shower/Tub: Chief Strategy Officer: Standard     Home Equipment: None          Prior Functioning/Environment Prior Level of Function : Independent/Modified Independent             Mobility Comments: Independent without AD ADLs Comments: Independent in home and community, but does receive rides from family. Does not drive per pt        OT Problem List: Decreased strength;Impaired balance (sitting and/or standing);Decreased activity tolerance;Cardiopulmonary status limiting activity;Decreased knowledge of use of DME or AE      OT Treatment/Interventions: Self-care/ADL training;Therapeutic exercise;DME and/or AE instruction;Patient/family education;Balance training;Therapeutic activities    OT Goals(Current goals can be found in the care plan section) Acute Rehab OT Goals Patient Stated Goal: get better OT Goal Formulation: With patient Time For Goal Achievement: 12/03/22 Potential to Achieve Goals: Good  OT Frequency: Min 2X/week    Co-evaluation              AM-PAC OT "6 Clicks" Daily Activity     Outcome Measure Help from another person eating meals?: None Help from another person taking care of personal grooming?: A Little Help from another person toileting, which includes using toliet, bedpan, or urinal?: A Lot Help from another person bathing (including washing, rinsing, drying)?: A Little Help from another person to put  on and taking off regular upper body clothing?: A Little Help from another person to put on and taking off regular lower body clothing?: A Little 6 Click Score: 18   End of Session Nurse Communication: Mobility status  Activity Tolerance: Patient tolerated treatment well Patient left: in chair;with call bell/phone within reach;with chair alarm set  OT Visit Diagnosis: Unsteadiness on feet (R26.81);Muscle  weakness (generalized) (M62.81)                Time: 4627-0350 OT Time Calculation (min): 22 min Charges:  OT General Charges $OT Visit: 1 Visit OT Evaluation $OT Eval Moderate Complexity: 1 Mod  Tyler Deis, OTR/L [facility] Office: [phone]  Myrla Halsted 11/19/2022, 10:08 AM

## 2022-11-19 NOTE — Progress Notes (Signed)
[facility] Consultation Progress Note  Patient: Steve Romero ZOX:[id number]   PCP: Raliegh Ip, DO DOB: 1970-11-04   DOA: 11/12/2022   DOS: 11/19/2022   Date of Service: the patient was seen and examined on 11/19/2022 Primary service: Fritzi Mandes, MD   Brief hospital course: PMH of type II DM, bipolar disorder, chronic pancreatitis with known pseudocyst, COPD presented to the hospital with complaints of abdominal pain was found to have acute on chronic pancreatitis with recurrent pancreatic pseudocyst.  GI and general surgery were consulted. Underwent open distal pancreatectomy with splenectomy and gastrorrhaphy on 5/14.  Assessment and Plan: Acute on chronic pancreatitis with recurrent pancreatic pseudocyst. Presents with abdominal pain. Lipase was 152. CT scan shows evidence of pancreatic pseudocyst up to 6.3 cm in size. GI was consulted.  Hepatobiliary surgery Dr. Clifton Custard was also consulted. Originally admitted to [facility] but transferred to [facility] for surgical intervention. On 5/14 underwent distal pancreatectomy and splenectomy. On TPN. NG removed on 5/16.  On clear liquid diet. Pain control and management per surgery. Appreciate surgical intervention.  Patient currently under surgical care.  Type 2 diabetes mellitus, uncontrolled with hyperglycemia with long-term insulin use without any complication. Uses Toujeo at home. Blood sugars were originally low but gradually trended up with TPN progression. Received IV Decadron during the surgery and receiving Robaxin with dextrose. Therefore blood sugar was significantly elevated. Currently on Semglee and sliding scale insulin.   TPN got disconnected overnight and currently on D5. No changes for 5/16 and sliding scale but will increase the Semglee dose to 12 units. Will monitor.  COPD. Currently stable.  No evidence of exacerbation. Continue Dulera and nebulizations. Patient will benefit from incentive  spirometry.  We will continue to follow the patient.    Subjective: Abdominal pain well-controlled with the PCA.  No nausea no vomiting.  No BM so far.  Objective: Vitals:   11/19/22 0809 11/19/22 1151 11/19/22 1238 11/19/22 1530  BP:  (!) 150/90    Pulse:  95    Resp:  17 17 15   Temp:  98.3 F (36.8 C)    TempSrc:  Oral    SpO2: 94% 100% 96% 96%  Weight:      Height:      In mild distress.  No rash. S1-S2 present. Clear to auscultation.  Faint basal crackles heard. Bowel sounds sluggish. No edema. awake and oriented x 3. Family Communication: No one at bedside  Data Reviewed: Since last encounter, pertinent lab results CBC and   . I have ordered test including CBC and BMP  .    Author: Lynden Oxford, MD 11/19/2022 4:06 PM  To reach On-call, see care teams to locate the attending and reach out to them via [email or web address]. If 7PM-7AM, please contact night-coverage If you still have difficulty reaching the attending provider, please page the [facility] (Director on Call) for [facility] on amion for assistance.

## 2022-11-19 NOTE — TOC Initial Note (Addendum)
Transition of Care [facility]) - Initial/Assessment Note    Patient Details  Name: Steve Romero MRN: [id number] Date of Birth: 1971/04/18  Transition of Care [facility]) CM/SW Contact:    Kingsley Plan, RN Phone Number: 11/19/2022, 12:08 PM  Clinical Narrative:                 Spoke to patient at bedside regarding recommendations for HHPT , walker and 3 in 1. PAtient in agreement.   Explained Medicaid only covers 2 to 3 HHPT visits a year and sometimes can be difficult to find an agency in network. NCM will begin calling agencies . Patient voiced understanding   Walker and 3 in 1 ordered with Jermaine with [employer]   Patient will be staying with his parents at discharge. Their address is [address], [place]  [address]   Marylene Land with [facility] unable to accept referral due to insurance.   Elnita Maxwell with [facility] unable to accept referral due to insurance.   Eber Jones with [facility] cannot accept referral , they do not cover address    Amy with [facility] unable to accept referral due to staffing   [place] with [facility] will review referral and call NCM with determination . [place] with [facility] unable to accept referral    Kandee Keen with [facility] unable to accept referral due to insurance .     Morrie Sheldon with [facility] unable to accept referral due to staffing    Left christian with [facility] a message . [facility] unable to accept due to insurance  Calvin  with [facility] unable to accept due to insurance.   Discussed OP PT , with patient and his mother patient said he will think about it. Sent secure chat to MD, PT, OT   Expected Discharge Plan: Home w Home Health Services Barriers to Discharge: Continued Medical Work up   Patient Goals and CMS Choice Patient states their goals for this hospitalization and ongoing recovery are:: to return to home CMS Medicare.gov Compare Post Acute Care list provided to:: Patient Choice offered to / list presented to : Patient [place]  ownership interest in [facility].provided to:: Patient    Expected Discharge Plan and Services   Discharge Planning Services: CM Consult Post Acute Care Choice: Home Health, Durable Medical Equipment Living arrangements for the past 2 months: Single Family Home                 DME Arranged: 3-N-1, Walker rolling DME Agency: [facility] spoke with at DME Agency: jermaine HH Arranged: PT          Prior Living Arrangements/Services Living arrangements for the past 2 months: Single Family Home Lives with:: Self Patient language and need for interpreter reviewed:: Yes Do you feel safe going back to the place where you live?: Yes      Need for Family Participation in Patient Care: Yes (Comment) Care giver support system in place?: Yes (comment)   Criminal Activity/Legal Involvement Pertinent to Current Situation/Hospitalization: No - Comment as needed  Activities of Daily Living Home Assistive Devices/Equipment: None ADL Screening (condition at time of admission) Patient's cognitive ability adequate to safely complete daily activities?: No Is the patient deaf or have difficulty hearing?: No Does the patient have difficulty seeing, even when wearing glasses/contacts?: No Does the patient have difficulty concentrating, remembering, or making decisions?: No Patient able to express need for assistance with ADLs?: No Does the patient have difficulty dressing or bathing?: No  Independently performs ADLs?: Yes (appropriate for developmental age) Does the patient have difficulty walking or climbing stairs?: No Weakness of Legs: None Weakness of Arms/Hands: None  Permission Sought/Granted   Permission granted to share information with : No              Emotional Assessment Appearance:: Appears stated age Attitude/Demeanor/Rapport: Engaged Affect (typically observed): Accepting Orientation: : Oriented to Self, Oriented to Place, Oriented to   Time, Oriented to Situation Alcohol / Substance Use: Not Applicable Psych Involvement: No (comment)  Admission diagnosis:  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.3] Patient Active Problem List   Diagnosis Date Noted   Controlled type 2 diabetes mellitus without complication, with long-term current use of insulin (HCC) 11/16/2022   Overweight (BMI 25.0-29.9) 11/16/2022   Abdominal pain, chronic, epigastric 11/12/2022   Chronic generalized abdominal pain 07/23/2022   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   Abnormal LFTs 06/18/2022   Bilious vomiting with nausea 06/18/2022   Acute pancreatitis 06/18/2022   Esophagitis determined by endoscopy 06/17/2022   Nausea vomiting and diarrhea 06/15/2022   Pseudocyst of pancreas 06/15/2022   Acute recurrent pancreatitis 06/15/2022   Hypokalemia 06/13/2022   COPD (chronic obstructive pulmonary disease) (HCC) 06/13/2022   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   Pancreatic pseudocyst 05/19/2022   Prediabetes 05/17/2022   AKI (acute kidney injury) (HCC) 05/17/2022   Erythrocytosis 05/17/2022   Tobacco use disorder 04/14/2022   Exocrine pancreatic insufficiency 04/14/2022   History of pancreatitis 09/05/2021   Biliary obstruction 06/04/2021   Pancreatic duct disruption 06/04/2021   Hx of adenomatous colonic polyps 06/04/2021   Acute on chronic pancreatitis (HCC) 03/13/2021   Biliary stricture 03/13/2021   Abnormal CT scan, gastrointestinal tract 03/13/2021   Duodenitis 03/13/2021   Early satiety 01/25/2021   Abnormal CT of liver 01/25/2021   Common bile duct dilation 01/25/2021   Jaundice 01/25/2021   Elevated alkaline phosphatase level 01/25/2021   Other ascites 01/25/2021   Unintentional weight loss 01/25/2021   Hemorrhage following endoscopic retrograde cholangiopancreatography (ERCP) 12/20/2020   Bile leak    Protein-calorie malnutrition, severe 11/07/2020   Ascending cholangitis 11/05/2020   Disorder of bile duct stent 11/01/2020    Pancreatic necrosis    Empyema of gallbladder    Calculus of gallbladder without cholecystitis without obstruction 09/26/2020   Abdominal wall mass of right upper quadrant 09/26/2020   Neoplasm of uncertain behavior 09/26/2020   Right upper quadrant abdominal pain 09/24/2020   Mass of right upper extremity 02/14/2020   Unilateral recurrent inguinal hernia without obstruction or gangrene    Carpal tunnel syndrome, left 09/24/2016   Essential hypertension 04/22/2015   Hyperlipidemia 04/22/2015   Schizoaffective disorder (HCC) 04/22/2015   Anxiety state 04/22/2015   Cigarette nicotine dependence without complication 04/22/2015   Edema 04/22/2015   Lymphadenopathy 04/22/2015   PCP:  Raliegh Ip, DO Pharmacy:   [facility] [address], [place] - [address] [place] [address] Phone: [phone] Fax: [phone]     Social Determinants of Health (SDOH) Social History: SDOH Screenings   Food Insecurity: No Food Insecurity (11/16/2022)  Housing: Low Risk  (11/16/2022)  Transportation Needs: No Transportation Needs (11/16/2022)  Utilities: Not At Risk (11/16/2022)  Depression (PHQ2-9): Low Risk  (09/25/2022)  Tobacco Use: High Risk (11/18/2022)   SDOH Interventions:     Readmission Risk Interventions    06/16/2022    8:03 AM  Readmission Risk Prevention Plan  Post Dischage Appt Complete  Medication Screening Complete  Transportation Screening  Complete

## 2022-11-19 NOTE — Progress Notes (Signed)
Physical Therapy Treatment Patient Details Name: Steve Romero MRN: [id number] DOB: 08-05-1970 Today's Date: 11/19/2022   History of Present Illness Pt is a 52 y/o M admitted on 11/12/22 after presenting with c/o recurrent severe abdominal pain & concern for recurrent pancreatitis. Pt is s/p open distal pancreatectomy with splenectomy & gastrorrhaphy x 2. PMH: DM2, bipolar disorder, chronic pancreatitis, pancreatic pseudocyst, AKI, anxiety, ascites, COPD, depression, HLD, HTN, schizoaffective disorder, thyroid disease    PT Comments    Pt received in supine and agreeable to session. Pt demonstrating improved bed mobility and transfers this session, requiring up to min guard for safety and assist with line management. Pt able to stand from EOB and step pivot to recliner with slow short steps. Once sitting in the recliner, pt begins to shiver and reports that he is not usually cold, but this shivering has happened a few times since admission. Pt provided with a warm blanket and pt defers further mobility this session. Pt continues to benefit from PT services to progress toward functional mobility goals.    Recommendations for follow up therapy are one component of a multi-disciplinary discharge planning process, led by the attending physician.  Recommendations may be updated based on patient status, additional functional criteria and insurance authorization.     Assistance Recommended at Discharge Intermittent Supervision/Assistance  Patient can return home with the following A little help with walking and/or transfers;A little help with bathing/dressing/bathroom;Assistance with cooking/housework;Assist for transportation;Help with stairs or ramp for entrance   Equipment Recommendations  Rolling walker (2 wheels)    Recommendations for Other Services       Precautions / Restrictions Precautions Precautions: Fall Precaution Comments: log roll for abdominal comfort Restrictions Weight Bearing  Restrictions: No     Mobility  Bed Mobility Overal bed mobility: Needs Assistance Bed Mobility: Supine to Sit   Sidelying to sit: HOB elevated, Supervision       General bed mobility comments: Pt performing log roll with cues and increased time    Transfers Overall transfer level: Needs assistance Equipment used: Rolling walker (2 wheels) Transfers: Sit to/from Stand, Bed to chair/wheelchair/BSC Sit to Stand: Min guard   Step pivot transfers: Min guard       General transfer comment: Min guard for safety and assist with line management    Ambulation/Gait               General Gait Details: unable to progress due to pain       Balance Overall balance assessment: Needs assistance Sitting-balance support: Feet supported Sitting balance-Leahy Scale: Fair Sitting balance - Comments: sitting EOB   Standing balance support: During functional activity, Bilateral upper extremity supported Standing balance-Leahy Scale: Poor Standing balance comment: with RW support                            Cognition Arousal/Alertness: Awake/alert Behavior During Therapy: WFL for tasks assessed/performed Overall Cognitive Status: Within Functional Limits for tasks assessed                                          Exercises      General Comments        Pertinent Vitals/Pain Pain Assessment Pain Assessment: 0-10 Pain Score: 6  Pain Location: abdomen, increases with movement Pain Descriptors / Indicators: Discomfort, Grimacing, Guarding Pain Intervention(s): Limited activity within patient's  tolerance, Monitored during session, Repositioned     PT Goals (current goals can now be found in the care plan section) Acute Rehab PT Goals Patient Stated Goal: decreased pain, get better PT Goal Formulation: With patient Time For Goal Achievement: 12/02/22 Potential to Achieve Goals: Good Progress towards PT goals: Progressing toward goals     Frequency    Min 3X/week      PT Plan Current plan remains appropriate       AM-PAC PT "6 Clicks" Mobility   Outcome Measure  Help needed turning from your back to your side while in a flat bed without using bedrails?: A Little Help needed moving from lying on your back to sitting on the side of a flat bed without using bedrails?: A Little Help needed moving to and from a bed to a chair (including a wheelchair)?: A Little Help needed standing up from a chair using your arms (e.g., wheelchair or bedside chair)?: A Little Help needed to walk in hospital room?: A Lot Help needed climbing 3-5 steps with a railing? : Total 6 Click Score: 15    End of Session Equipment Utilized During Treatment: Gait belt Activity Tolerance: Patient limited by pain;Patient limited by fatigue;Other (comment) (shivering) Patient left: in chair;with chair alarm set;with call bell/phone within reach Nurse Communication: Mobility status PT Visit Diagnosis: Muscle weakness (generalized) (M62.81);Difficulty in walking, not elsewhere classified (R26.2);Unsteadiness on feet (R26.81)     Time: 1191-4782 PT Time Calculation (min) (ACUTE ONLY): 17 min  Charges:  $Therapeutic Activity: 8-22 mins                     Johny Shock, PTA Acute Rehabilitation Services Secure Chat Preferred  [phone]    Johny Shock 11/19/2022, 4:45 PM

## 2022-11-19 NOTE — Progress Notes (Addendum)
PHARMACY - TOTAL PARENTERAL NUTRITION CONSULT NOTE  Indication: Severe pancreatitic pseudocyst with GOO  Patient Measurements: Height: 5\' 10"  (177.8 cm) Weight: 99.1 kg (218 lb 7.6 oz) IBW/kg (Calculated) : 73 TPN AdjBW (KG): 77.7 Body mass index is 31.35 kg/m.  Assessment:  [name] with a history of post-ERCP necrotizing pancreatitis.  CT scan shows enlargement of the pancreatic pseudocyst.  Early satiety and vomiting are presumably due to GOO secondary to mass effect of the cyst.  Attempted placement of small bore NJT on 5/10, but was positive for CO2 (placed in lung) and removed, patient refused further attempt.  Pharmacy has been consulted to dose TPN.    Glucose / Insulin: hx DM, A1c 6.8% (07/21/22), Toujeo 5 units BID PTA.  Inpatient started on Semglee 5 units QHS (last given on 5/10, then d/c) CBGs elevated 5/15, likely d/t stress response and from Decadron 10mg  on 5/14 PM, Semglee 10/d added - now better, used 63 units SSI in past 24 hrs (usage improving) Electrolytes: all WNL (K/Phos came down more than anticipated) Renal: SCr < 1, BUN 23 Hepatic: alk phos normalized, AST / AST / tbili / TG WNL, albumin 2.3, lipase 108 Intake / Output; MIVF: UOP 0.8 ml/kg/hr, drain , LR at 30 ml/hr GI Imaging:  5/9 CT: enlarging fluid collection c/w pancreatic psuedocyst  GI Surgeries / Procedures:  5/14 open distal pancreatectomy and splenectomy   Central access: PICC placed 11/14/22 TPN start date: 11/14/22  Nutritional Goals:  RD Estimated Needs Total Energy Estimated Needs: 2200-2400 Total Protein Estimated Needs: 110-125g Total Fluid Estimated Needs: 2.2L/day  Current Nutrition:  TPN CLD 5/16  Plan:  Continue TPN at goal rate of 90 ml/hr to provide 119g AA, 324g CHO, 65g ILE and 2225 kCal, meeting 100% of needs Electrolytes in TPN: increase Na to 120 mEq/L on 4/15, increase K to 39mEq/L, Ca 60mEq/L, increase Mg to 18mEq/L on 4/15, increase Phos to 55mmol/L, change Cl:Ac 1:2 on  4/15 Add standard MVI and trace elements to TPN Continue resistant SSI Q4H - anticipate post-op hyperglycemia to improve and the effect of Decadron to wear off soon.  Semglee increased to 12 units SQ daily per MD.  If patient consistently requires long-acting insulin, consider adding insulin to TPN. LR at 30 ml/hr per MD Monitor TPN labs on Mon/Thurs - labs in AM F/U vaccines  Keaisha Sublette D. Laney Potash, PharmD, BCPS, BCCCP 11/19/2022, 8:40 AM  ===========================  Addendum: TPN was disconnected around 4098-1191 per RN Start D5NS at 90 ml/hr until new bag starts - avoid D10W given higher carb content  Orin Eberwein D. Laney Potash, PharmD, BCPS, BCCCP 11/19/2022, 9:06 AM

## 2022-11-19 NOTE — Progress Notes (Signed)
IV team consult received regarding TPN that had gotten disconnected. Notified primary RN and MD via secure chat that D10 order at same rate needs to be placed, TPN needs to be discarded, and pharmacy needs to be notified.

## 2022-11-19 NOTE — Progress Notes (Signed)
Notified pharmacy of TPN being disconnected and unable to be reconnected. Pharmacy placing orders to initiate D5-NS at 50ml/hr until evening tpn.

## 2022-11-19 NOTE — Progress Notes (Signed)
    2 Days Post-Op  Subjective: No acute changes. Denies nausea/vomiting. Pain well-controlled with PCA.    Objective: Vital signs in last 24 hours: Temp:  [97.7 F (36.5 C)-100.3 F (37.9 C)] 98.2 F (36.8 C) (05/16 0433) Pulse Rate:  [81-115] 110 (05/16 0433) Resp:  [10-22] 16 (05/16 0719) BP: (120-140)/(65-79) 140/79 (05/16 0433) SpO2:  [93 %-98 %] 95 % (05/16 0719) Arterial Line BP: (111-129)/(60-69) 129/69 (05/15 1000) FiO2 (%):  [0 %] 0 % (05/16 0437) Weight:  [99.1 kg] 99.1 kg (05/16 0446) Last BM Date : 11/11/22  Intake/Output from previous day: 05/15 0701 - 05/16 0700 In: 2233.1 [I.V.:2013.1; IV Piggyback:220] Out: 1055 [Urine:950; Drains:105] Intake/Output this shift: No intake/output data recorded.  PE: General: resting comfortably, NAD Neuro: alert and oriented, no focal deficits HEENT: NG in place, not connected to suction Resp: normal work of breathing Abdomen: soft, mildly distended, nontender to palpation. Midline incision clean and dry with no erythema or induration. Staples in place, honeycomb dressing is clean. JP with serosanguinous drainage. Extremities: warm and well-perfused    Lab Results:  Recent Labs    11/18/22 0521 11/19/22 0309  WBC 11.6* 14.6*  HGB 10.9* 9.2*  HCT 31.5* 27.3*  PLT 199 231   BMET Recent Labs    11/18/22 0521 11/19/22 0309  NA 135 140  K 4.4 3.8  CL 105 108  CO2 21* 24  GLUCOSE 382* 169*  BUN 22* 23*  CREATININE 1.09 1.03  CALCIUM 7.7* 7.9*   PT/INR No results for input(s): "LABPROT", "INR" in the last 72 hours. CMP     Component Value Date/Time   NA 140 11/19/2022 0309   NA 139 09/25/2022 0837   K 3.8 11/19/2022 0309   CL 108 11/19/2022 0309   CO2 24 11/19/2022 0309   GLUCOSE 169 (H) 11/19/2022 0309   BUN 23 (H) 11/19/2022 0309   BUN 17 09/25/2022 0837   CREATININE 1.03 11/19/2022 0309   CREATININE 0.55 (L) 01/13/2021 1109   CALCIUM 7.9 (L) 11/19/2022 0309   PROT 4.6 (L) 11/19/2022 0309    PROT 6.3 10/14/2022 0816   ALBUMIN 2.3 (L) 11/19/2022 0309   ALBUMIN 4.3 10/14/2022 0816   AST 12 (L) 11/19/2022 0309   ALT 19 11/19/2022 0309   ALKPHOS 114 11/19/2022 0309   BILITOT 0.3 11/19/2022 0309   BILITOT 0.5 10/14/2022 0816   GFRNONAA >60 11/19/2022 0309   GFRNONAA 89 04/22/2015 0828   GFRAA 85 10/04/2019 1433   GFRAA >89 04/22/2015 0828   Lipase     Component Value Date/Time   LIPASE 108 (H) 11/16/2022 0440       Studies/Results: No results found.      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 POD2 s/p open distal pancreatectomy with splenectomy and gastrorrhaphy x2. - Remove NG, advance to clear liquid diet - Continue TPN - Pain control: scheduled PO tylenol, IV robaxin, dilaudid PCA - Hyperglycemia: on sliding scale and glargine, appreciate hospitalist assistance. - COPD: Dulera, Incruse and duonebs per hospitalist.  - Mobilize, PT following - Check drain amylase on POD1 and 3 - VTE: lovenox, SCDs - Dispo: inpatient, med-surg floor    LOS: 7 days    Sophronia Simas, MD [facility], Hepatobiliary and Pancreatic Surgery 11/19/22 7:36 AM

## 2022-11-19 NOTE — Progress Notes (Signed)
Pt called nurse into room and stated his TPN was no longer connected. TPN line laying in bed not connected to patient. IV team notified.

## 2022-11-20 DIAGNOSIS — K863 Pseudocyst of pancreas: Secondary | ICD-10-CM | POA: Diagnosis not present

## 2022-11-20 LAB — BASIC METABOLIC PANEL
Anion gap: 7 (ref 5–15)
BUN: 15 mg/dL (ref 6–20)
CO2: 27 mmol/L (ref 22–32)
Calcium: 7.6 mg/dL — ABNORMAL LOW (ref 8.9–10.3)
Chloride: 105 mmol/L (ref 98–111)
Creatinine, Ser: 0.82 mg/dL (ref 0.61–1.24)
GFR, Estimated: >60 mL/min (ref >60)
Glucose, Bld: 178 mg/dL — ABNORMAL HIGH (ref 70–99)
Potassium: 3.8 mmol/L (ref 3.5–5.1)
Sodium: 139 mmol/L (ref 135–145)

## 2022-11-20 LAB — TYPE AND SCREEN
Antibody Screen: NEGATIVE
Unit division: 0
Unit division: 0

## 2022-11-20 LAB — BPAM RBC: Blood Product Expiration Date: 202406042359

## 2022-11-20 LAB — MAGNESIUM: Magnesium: 1.9 mg/dL (ref 1.7–2.4)

## 2022-11-20 LAB — GLUCOSE, CAPILLARY
Glucose-Capillary: 186 mg/dL — ABNORMAL HIGH (ref 70–99)
Glucose-Capillary: 213 mg/dL — ABNORMAL HIGH (ref 70–99)
Glucose-Capillary: 318 mg/dL — ABNORMAL HIGH (ref 70–99)
Glucose-Capillary: 283 mg/dL — ABNORMAL HIGH (ref 70–99)
Glucose-Capillary: 222 mg/dL — ABNORMAL HIGH (ref 70–99)

## 2022-11-20 LAB — PHOSPHORUS: Phosphorus: 2.8 mg/dL (ref 2.5–4.6)

## 2022-11-20 MED ORDER — TRAVASOL 10 % IV SOLN: INTRAVENOUS | Status: DC

## 2022-11-20 MED ORDER — LACTATED RINGERS IV SOLN: INTRAVENOUS | Status: DC

## 2022-11-20 MED ORDER — INSULIN ASPART 100 UNIT/ML IJ SOLN
0.0000 [IU] | Freq: Every day | INTRAMUSCULAR | Status: DC
  Administered 2022-11-20: 2 [IU] via SUBCUTANEOUS

## 2022-11-20 MED ORDER — TRAVASOL 10 % IV SOLN
INTRAVENOUS | Status: AC
  Filled 2022-11-20: qty 594

## 2022-11-20 MED ORDER — ENSURE ENLIVE PO LIQD
237.0000 mL | Freq: Two times a day (BID) | ORAL | Status: DC
  Administered 2022-11-20 – 2022-11-21 (×3): 237 mL via ORAL

## 2022-11-20 MED ORDER — INSULIN ASPART 100 UNIT/ML IJ SOLN
3.0000 [IU] | Freq: Three times a day (TID) | INTRAMUSCULAR | Status: DC
  Administered 2022-11-20 (×2): 3 [IU] via SUBCUTANEOUS

## 2022-11-20 MED ORDER — MAGNESIUM SULFATE 2 GM/50ML IV SOLN
2.0000 g | Freq: Once | INTRAVENOUS | Status: AC
  Administered 2022-11-20: 2 g via INTRAVENOUS
  Filled 2022-11-20: qty 50

## 2022-11-20 MED ORDER — INSULIN GLARGINE-YFGN 100 UNIT/ML ~~LOC~~ SOLN
8.0000 [IU] | Freq: Every day | SUBCUTANEOUS | Status: DC
  Administered 2022-11-20: 8 [IU] via SUBCUTANEOUS
  Filled 2022-11-20 (×2): qty 0.08

## 2022-11-20 MED ORDER — INSULIN ASPART 100 UNIT/ML IJ SOLN
0.0000 [IU] | Freq: Three times a day (TID) | INTRAMUSCULAR | Status: DC
  Administered 2022-11-20: 7 [IU] via SUBCUTANEOUS
  Administered 2022-11-20: 15 [IU] via SUBCUTANEOUS

## 2022-11-20 MED ORDER — POTASSIUM CHLORIDE 10 MEQ/50ML IV SOLN
10.0000 meq | INTRAVENOUS | Status: AC
  Administered 2022-11-20 (×2): 10 meq via INTRAVENOUS
  Filled 2022-11-20 (×2): qty 50

## 2022-11-20 NOTE — Progress Notes (Signed)
[facility] Consultation Progress Note  Patient: Steve Romero ZOX:[id number]   PCP: Raliegh Ip, DO DOB: 08/24/1970   DOA: 11/12/2022   DOS: 11/20/2022   Date of Service: the patient was seen and examined on 11/20/2022 Primary service: Fritzi Mandes, MD   Brief hospital course: PMH of type II DM, bipolar disorder, chronic pancreatitis with known pseudocyst, COPD presented to the hospital with complaints of abdominal pain was found to have acute on chronic pancreatitis with recurrent pancreatic pseudocyst.  GI and general surgery were consulted. Underwent open distal pancreatectomy with splenectomy and gastrorrhaphy on 5/14.  Assessment and Plan: Acute on chronic pancreatitis with recurrent pancreatic pseudocyst. Presents with abdominal pain. Lipase was 152. CT scan shows evidence of pancreatic pseudocyst up to 6.3 cm in size. GI was consulted.  Hepatobiliary surgery Dr. Clifton Custard was also consulted. Originally admitted to [facility] but transferred to [facility] for surgical intervention. On 5/14 underwent distal pancreatectomy and splenectomy. On TPN. NG removed on 5/16.  On full liquid diet. Pain control and management per surgery. Appreciate surgical intervention.  Patient currently under surgical care.  Type 2 diabetes mellitus, uncontrolled with hyperglycemia with long-term insulin use without any complication. Uses Toujeo at home. Blood sugars were originally low but gradually trended up with TPN progression. Received IV Decadron 5/14. Receiving Robaxin with dextrose. Currently on Semglee and sliding scale insulin.   Plan is to advance to full liquid diet as well as reduce TPN to half. Based on the anticipated lower need for long-acting insulin therefore we will reduce Semglee to 8 units. Given that the patient is now on full liquid diet and tolerating it well, switching from every 4 hours resistant sliding scale to ACHS scale.  And add Premeal coverage. Will  monitor.  COPD. Currently stable.  No evidence of exacerbation. Continue Dulera and nebulizations. Patient will benefit from incentive spirometry.  We will continue to follow the patient.    Subjective: Abdominal pain still unchanged.  No nausea no vomiting.  No fever no chills.  Passing gas but no BM.  No shortness of breath.  Objective: Vitals:   11/20/22 0500 11/20/22 0519 11/20/22 0752 11/20/22 0910  BP:  126/83 119/67   Pulse:  72 77   Resp:  18 17 17   Temp:  97.7 F (36.5 C) 98.5 F (36.9 C)   TempSrc:  Oral Oral   SpO2:  100% 98% 98%  Weight: 95.2 kg     Height:      In mild distress. Clear to auscultation. Bowel sound present.  Family Communication: No one at bedside  Data Reviewed: Since last encounter, pertinent lab results CBC and BMP   . I have ordered test including CBC and BMP  .     Author: Lynden Oxford, MD 11/20/2022 12:38 PM  To reach On-call, see care teams to locate the attending and reach out to them via [email or web address]. If 7PM-7AM, please contact night-coverage If you still have difficulty reaching the attending provider, please page the [facility] (Director on Call) for [facility] on amion for assistance.

## 2022-11-20 NOTE — Progress Notes (Addendum)
PHARMACY - TOTAL PARENTERAL NUTRITION CONSULT NOTE  Indication: Severe pancreatitic pseudocyst with GOO  Patient Measurements: Height: 5\' 10"  (177.8 cm) Weight: 95.2 kg (209 lb 14.1 oz) IBW/kg (Calculated) : 73 TPN AdjBW (KG): 77.7 Body mass index is 30.11 kg/m.  Assessment:  59 yoM with a history of post-ERCP necrotizing pancreatitis.  CT scan shows enlargement of the pancreatic pseudocyst.  Early satiety and vomiting are presumably due to GOO secondary to mass effect of the cyst.  Attempted placement of small bore NJT on 5/10, but was positive for CO2 (placed in lung) and removed, patient refused further attempt.  Pharmacy has been consulted to dose TPN.    Glucose / Insulin: hx DM, A1c 6.8% (07/21/22), Current CBG's range 156-251, Toujeo 5 units BID PTA.  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 Start Full Liquid Diet per Surgery Electrolytes in TPN: Increased e-lytes to account for reduced volume with  1/2 rate TPN as follows: Na 154 mEq/L, K 80 mEq/L, Ca 5 mEq/L, Mg to 15 mEq/L, Phos 20 mmol/L, Cl:Ac 1:2 Add standard MVI and trace elements to TPN Change iSS- resistant to ACHS - anticipate post-op hyperglycemia to improve and the effect of Decadron to wear off soon.  Semglee decreased to 8 units SQ daily per MD. Potassium runs iv x2 Magnesium 2 grams iv x1 Monitor TPN labs on Mon/Thurs AM labs (BMP) F/U vaccines   Greta Doom BS, PharmD, BCPS Clinical Pharmacist 11/20/2022 9:38 AM  Contact: [phone] after 3 PM  "Be curious, not judgmental..." -Debbora Dus

## 2022-11-20 NOTE — Progress Notes (Signed)
Nutrition Follow-up  DOCUMENTATION CODES:      INTERVENTION:  Ensure Enlive po BID, each supplement provides 350 kcal and 20 grams of protein. Continue TPN   NUTRITION DIAGNOSIS:   Inadequate oral intake related to nausea, vomiting, chronic illness as evidenced by per patient/family report. - Being addressed via TPN  GOAL:   Patient will meet greater than or equal to 90% of their needs - Being addressed via TPN  MONITOR:   PO intake, Supplement acceptance, Labs, I & O's  REASON FOR ASSESSMENT:   Consult New TPN/TNA  ASSESSMENT:   52 y.o. male with medical history significant for insulin-dependent type 2 diabetes, bipolar disorder, chronic pancreatitis with known pancreatic pseudocyst being admitted to the hospital with recurrent severe abdominal pain and concern for recurrent pancreatitis.  The patient has a complicated history of chronic pancreatitis and known pancreatic pseudocyst after complications from an elective laparoscopic cholecystectomy in April 2022.  This was followed by severe necrotizing pancreatitis after ERCP.  He subsequently developed a biliary stricture for which he had a metal CBD stent and balloon sphincteroplasty in November 2023, and recurrent pseudocyst in the body of his pancreas, which has been drained endoscopically multiple times with gastroenterology.  5/09 - Admitted to [facility] 5/11 - TPN started 5/13 - transferred to [facility] 5/14 - Op, s/p distal pancreatectomy with splenectomy and gastrorrhaphy x2; ICU post-op; NGT placed 5/15 - transferred to floor 5/16 - diet advanced to clear liquids; NGT removed 5/17 - diet advanced to full liquids; TPN 1/2 rate per MD  Pt sitting up in bed. Reports that he has ate some. Denies any nausea or vomiting. Still no BM since admission. Pt with no menu, RD provided and reviewed with it. Pt provided lunch order to RD. RD encouraged pt to ambulate. Discussed the use of Ensure, pt agreeable and prefers chocolate or vanilla.    Medications reviewed and include: Colace, NovoLog SSI 0-20 units TID + 0-5 units daily + 3 units TID, Semglee, IV magnesium sulfate, IV potassium chloride Labs reviewed: Sodium 139, Potassium 3.8, Phosphorus 2.8, Magnesium 1.9, 24 hr CBGs 186-318  Diet Order:   Diet Order             Diet full liquid Room service appropriate? Yes; Fluid consistency: Thin  Diet effective now                  EDUCATION NEEDS:  Education needs have been addressed  Skin:  Skin Assessment: Reviewed RN Assessment  Last BM:  5/10  Height:  Ht Readings from Last 1 Encounters:  11/17/22 5\' 10"  (1.778 m)   Weight:  Wt Readings from Last 1 Encounters:  11/20/22 95.2 kg    Ideal Body Weight:  75.5 kg  BMI:  Body mass index is 30.11 kg/m.  Estimated Nutritional Needs:  Kcal:  2200-2400 Protein:  110-125g Fluid:  2.2L/day   Kirby Crigler RD, LDN Clinical Dietitian See Loretha Stapler for contact information.

## 2022-11-20 NOTE — Plan of Care (Signed)

## 2022-11-20 NOTE — Progress Notes (Signed)
Pt. States that he does not wear cpap.

## 2022-11-20 NOTE — Progress Notes (Signed)
    3 Days Post-Op  Subjective: No acute changes. Tolerating clear liquids, no nausea or vomiting. Passing flatus.   Objective: Vital signs in last 24 hours: Temp:  [97.7 F (36.5 C)-98.3 F (36.8 C)] 97.7 F (36.5 C) (05/17 0519) Pulse Rate:  [71-95] 72 (05/17 0519) Resp:  [15-18] 18 (05/17 0519) BP: (116-150)/(73-90) 126/83 (05/17 0519) SpO2:  [94 %-100 %] 100 % (05/17 0519) FiO2 (%):  [0 %] 0 % (05/17 0400) Weight:  [95.2 kg] 95.2 kg (05/17 0500) Last BM Date : 11/13/22  Intake/Output from previous day: 05/16 0701 - 05/17 0700 In: 2336.8 [P.O.:480; I.V.:1526.8; IV Piggyback:330] Out: 2110 [Urine:2050; Drains:60] Intake/Output this shift: No intake/output data recorded.  PE: General: resting comfortably, NAD Neuro: alert and oriented, no focal deficits Resp: normal work of breathing Abdomen: soft, mildly distended, nontender to palpation. Midline incision clean and dry with no erythema or induration. Staples in place, honeycomb dressing is clean. JP with serosanguinous drainage. Extremities: warm and well-perfused    Lab Results:  Recent Labs    11/18/22 0521 11/19/22 0309  WBC 11.6* 14.6*  HGB 10.9* 9.2*  HCT 31.5* 27.3*  PLT 199 231   BMET Recent Labs    11/19/22 0309 11/20/22 0323  NA 140 139  K 3.8 3.8  CL 108 105  CO2 24 27  GLUCOSE 169* 178*  BUN 23* 15  CREATININE 1.03 0.82  CALCIUM 7.9* 7.6*   PT/INR No results for input(s): "LABPROT", "INR" in the last 72 hours. CMP     Component Value Date/Time   NA 139 11/20/2022 0323   NA 139 09/25/2022 0837   K 3.8 11/20/2022 0323   CL 105 11/20/2022 0323   CO2 27 11/20/2022 0323   GLUCOSE 178 (H) 11/20/2022 0323   BUN 15 11/20/2022 0323   BUN 17 09/25/2022 0837   CREATININE 0.82 11/20/2022 0323   CREATININE 0.55 (L) 01/13/2021 1109   CALCIUM 7.6 (L) 11/20/2022 0323   PROT 4.6 (L) 11/19/2022 0309   PROT 6.3 10/14/2022 0816   ALBUMIN 2.3 (L) 11/19/2022 0309   ALBUMIN 4.3 10/14/2022 0816    AST 12 (L) 11/19/2022 0309   ALT 19 11/19/2022 0309   ALKPHOS 114 11/19/2022 0309   BILITOT 0.3 11/19/2022 0309   BILITOT 0.5 10/14/2022 0816   GFRNONAA >60 11/20/2022 0323   GFRNONAA 89 04/22/2015 0828   GFRAA 85 10/04/2019 1433   GFRAA >89 04/22/2015 0828   Lipase     Component Value Date/Time   LIPASE 108 (H) 11/16/2022 0440       Studies/Results: No results found.      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 POD3 s/p open distal pancreatectomy with splenectomy and gastrorrhaphy x2. - Advance to full liquid diet - Wean TPN to 1/2 rate - Pain control: scheduled PO tylenol, IV robaxin, dilaudid PCA. Transition to PO meds once tolerating more PO intake. - Hyperglycemia: on sliding scale and glargine, appreciate hospitalist assistance. - COPD: Bernestine Amass and duonebs per hospitalist.  - Mobilize, PT following - Send drain amylase today - VTE: lovenox, SCDs - Dispo: inpatient, med-surg floor    LOS: 8 days    Sophronia Simas, MD [facility] Surgery General, Hepatobiliary and Pancreatic Surgery 11/20/22 7:43 AM

## 2022-11-21 DIAGNOSIS — K863 Pseudocyst of pancreas: Secondary | ICD-10-CM | POA: Diagnosis not present

## 2022-11-21 LAB — BASIC METABOLIC PANEL
Anion gap: 5 (ref 5–15)
BUN: 10 mg/dL (ref 6–20)
CO2: 29 mmol/L (ref 22–32)
Calcium: 8 mg/dL — ABNORMAL LOW (ref 8.9–10.3)
Chloride: 104 mmol/L (ref 98–111)
Creatinine, Ser: 0.74 mg/dL (ref 0.61–1.24)
GFR, Estimated: >60 mL/min (ref >60)
Glucose, Bld: 123 mg/dL — ABNORMAL HIGH (ref 70–99)
Potassium: 4 mmol/L (ref 3.5–5.1)
Sodium: 138 mmol/L (ref 135–145)

## 2022-11-21 LAB — GLUCOSE, CAPILLARY
Glucose-Capillary: 191 mg/dL — ABNORMAL HIGH (ref 70–99)
Glucose-Capillary: 253 mg/dL — ABNORMAL HIGH (ref 70–99)
Glucose-Capillary: 197 mg/dL — ABNORMAL HIGH (ref 70–99)
Glucose-Capillary: 235 mg/dL — ABNORMAL HIGH (ref 70–99)

## 2022-11-21 LAB — PHOSPHORUS: Phosphorus: 3.6 mg/dL (ref 2.5–4.6)

## 2022-11-21 LAB — MAGNESIUM: Magnesium: 2.1 mg/dL (ref 1.7–2.4)

## 2022-11-21 MED ORDER — INSULIN GLARGINE-YFGN 100 UNIT/ML ~~LOC~~ SOLN
15.0000 [IU] | Freq: Every day | SUBCUTANEOUS | Status: DC
  Administered 2022-11-21: 15 [IU] via SUBCUTANEOUS
  Filled 2022-11-21 (×2): qty 0.15

## 2022-11-21 MED ORDER — INSULIN ASPART 100 UNIT/ML IJ SOLN
0.0000 [IU] | Freq: Three times a day (TID) | INTRAMUSCULAR | Status: DC
  Administered 2022-11-21: 3 [IU] via SUBCUTANEOUS
  Administered 2022-11-21: 8 [IU] via SUBCUTANEOUS
  Administered 2022-11-21: 3 [IU] via SUBCUTANEOUS
  Administered 2022-11-22 – 2022-11-23 (×3): 2 [IU] via SUBCUTANEOUS

## 2022-11-21 MED ORDER — INSULIN ASPART 100 UNIT/ML IJ SOLN
6.0000 [IU] | Freq: Three times a day (TID) | INTRAMUSCULAR | Status: DC
  Administered 2022-11-21 – 2022-11-23 (×7): 6 [IU] via SUBCUTANEOUS

## 2022-11-21 MED ORDER — POLYETHYLENE GLYCOL 3350 17 G PO PACK: 17.0000 g | PACK | Freq: Every day | ORAL | Status: DC | PRN

## 2022-11-21 MED ORDER — METHOCARBAMOL 500 MG PO TABS
1000.0000 mg | ORAL_TABLET | Freq: Three times a day (TID) | ORAL | Status: DC
  Administered 2022-11-21 – 2022-11-23 (×7): 1000 mg via ORAL
  Filled 2022-11-21 (×7): qty 2

## 2022-11-21 MED ORDER — HYDROMORPHONE HCL 1 MG/ML IJ SOLN
0.5000 mg | INTRAMUSCULAR | Status: DC | PRN
  Administered 2022-11-21 – 2022-11-22 (×3): 0.5 mg via INTRAVENOUS
  Filled 2022-11-21 (×3): qty 0.5

## 2022-11-21 MED ORDER — INSULIN ASPART 100 UNIT/ML IJ SOLN
0.0000 [IU] | Freq: Every day | INTRAMUSCULAR | Status: DC
  Administered 2022-11-21 – 2022-11-22 (×2): 2 [IU] via SUBCUTANEOUS

## 2022-11-21 MED ORDER — OXYCODONE HCL 5 MG PO TABS
10.0000 mg | ORAL_TABLET | ORAL | Status: DC | PRN
  Administered 2022-11-21: 10 mg via ORAL
  Administered 2022-11-22 (×2): 15 mg via ORAL
  Administered 2022-11-22: 10 mg via ORAL
  Administered 2022-11-23 (×2): 15 mg via ORAL
  Filled 2022-11-21 (×3): qty 3
  Filled 2022-11-21: qty 2
  Filled 2022-11-21: qty 3
  Filled 2022-11-21: qty 2

## 2022-11-21 MED ORDER — GLUCERNA SHAKE PO LIQD
237.0000 mL | Freq: Two times a day (BID) | ORAL | Status: DC
  Administered 2022-11-22 – 2022-11-23 (×3): 237 mL via ORAL

## 2022-11-21 NOTE — Progress Notes (Signed)
    4 Days Post-Op  Subjective: Pain controlled. Tolerating full liquids. Had a BM yesterday. Denies nausea/vomiting. Did not ambulate yesterday.   Objective: Vital signs in last 24 hours: Temp:  [97.4 F (36.3 C)-98.5 F (36.9 C)] 97.4 F (36.3 C) (05/18 0602) Pulse Rate:  [63-80] 63 (05/18 0602) Resp:  [12-21] 18 (05/18 0602) BP: (108-119)/(60-70) 108/67 (05/18 0602) SpO2:  [95 %-100 %] 100 % (05/18 0602) Last BM Date : 11/20/22  Intake/Output from previous day: 05/17 0701 - 05/18 0700 In: 2669.5 [P.O.:920; I.V.:1429.5; IV Piggyback:320] Out: 2795 [Urine:2575; Drains:220] Intake/Output this shift: Total I/O In: -  Out: 1180 [Urine:1100; Drains:80]  PE: General: resting comfortably, NAD Neuro: alert and oriented, no focal deficits Resp: normal work of breathing CV: RRR Abdomen: soft, mildly distended, nontender to palpation. Midline incision clean and dry with no erythema or induration. Staples in place, honeycomb dressing is clean. JP with serosanguinous drainage. Extremities: warm and well-perfused    Lab Results:  Recent Labs    11/19/22 0309  WBC 14.6*  HGB 9.2*  HCT 27.3*  PLT 231   BMET Recent Labs    11/20/22 0323 11/21/22 0405  NA 139 138  K 3.8 4.0  CL 105 104  CO2 27 29  GLUCOSE 178* 123*  BUN 15 10  CREATININE 0.82 0.74  CALCIUM 7.6* 8.0*   PT/INR No results for input(s): "LABPROT", "INR" in the last 72 hours. CMP     Component Value Date/Time   NA 138 11/21/2022 0405   NA 139 09/25/2022 0837   K 4.0 11/21/2022 0405   CL 104 11/21/2022 0405   CO2 29 11/21/2022 0405   GLUCOSE 123 (H) 11/21/2022 0405   BUN 10 11/21/2022 0405   BUN 17 09/25/2022 0837   CREATININE 0.74 11/21/2022 0405   CREATININE 0.55 (L) 01/13/2021 1109   CALCIUM 8.0 (L) 11/21/2022 0405   PROT 4.6 (L) 11/19/2022 0309   PROT 6.3 10/14/2022 0816   ALBUMIN 2.3 (L) 11/19/2022 0309   ALBUMIN 4.3 10/14/2022 0816   AST 12 (L) 11/19/2022 0309   ALT 19 11/19/2022 0309    ALKPHOS 114 11/19/2022 0309   BILITOT 0.3 11/19/2022 0309   BILITOT 0.5 10/14/2022 0816   GFRNONAA >60 11/21/2022 0405   GFRNONAA 89 04/22/2015 0828   GFRAA 85 10/04/2019 1433   GFRAA >89 04/22/2015 0828   Lipase     Component Value Date/Time   LIPASE 108 (H) 11/16/2022 0440       Studies/Results: No results found.      Assessment/Plan 53 yo male with recurrent pancreatic pseudocyst secondary to prior necrotizing pancreatitis with disconnected pancreatic duct. POD4 s/p open distal pancreatectomy with splenectomy and gastrorrhaphy x2. - Advance to carb control diet - Allow current TPN bag to run out, then discontinue TPN - Bowel regimen - Pain control: Discontinue PCA and transition to prn oxycodone and dilaudid. Continue scheduled tylenol and robaxin. - Hyperglycemia: on sliding scale and glargine, appreciate hospitalist assistance. - COPD: Dulera, Incruse and duonebs per hospitalist.  - Needs to mobilize more, encouraged to ambulate today - POD3 drain amylase pending - VTE: lovenox, SCDs - Dispo: inpatient, med-surg floor    LOS: 9 days    Steve Simas, MD [facility] Surgery General, Hepatobiliary and Pancreatic Surgery 11/21/22 6:52 AM

## 2022-11-21 NOTE — Progress Notes (Signed)
[facility] Consultation Progress Note  Patient: Rielly Imran ZOX:[id number]   PCP: Raliegh Ip, DO DOB: September 13, 1970   DOA: 11/12/2022   DOS: 11/21/2022   Date of Service: the patient was seen and examined on 11/21/2022 Primary service: Fritzi Mandes, MD   Brief hospital course: PMH of type II DM, bipolar disorder, chronic pancreatitis with known pseudocyst, COPD presented to the hospital with complaints of abdominal pain was found to have acute on chronic pancreatitis with recurrent pancreatic pseudocyst.  GI and general surgery were consulted. Underwent open distal pancreatectomy with splenectomy and gastrorrhaphy on 5/14.  Assessment and Plan: Acute on chronic pancreatitis with recurrent pancreatic pseudocyst. Presents with abdominal pain. Lipase was 152. CT scan shows evidence of pancreatic pseudocyst up to 6.3 cm in size. GI was consulted.  Hepatobiliary surgery Dr. Clifton Custard was also consulted. Originally admitted to [facility] but transferred to [facility] for surgical intervention. On 5/14 underwent distal pancreatectomy and splenectomy.  Was on TPN. NG removed on 5/16.  Highly appreciate surgery assistance.  Type 2 diabetes mellitus, uncontrolled with hyperglycemia with long-term insulin use without any complication. Uses Toujeo at home. Currently on Semglee and sliding scale insulin.   Tolerating oral diet.  Therefore will increase long-acting insulin to 15 units as well as increase Premeal coverage. Reduce sliding scale from resistant to moderate. Most likely patient will require 20 units of long-acting insulin on discharge. Changing Ensure to Glucerna for DM.  COPD. Currently stable.  No evidence of exacerbation. Continue Dulera and nebulizations. Patient will benefit from incentive spirometry.  We will continue to follow the patient.    Subjective: Abdominal pain improving.  Educated on use of the pain medications specifically focusing on orals.  No nausea  or vomiting.  Passing gas.  No shortness of breath.  Objective: Vitals:   11/21/22 0602 11/21/22 0742 11/21/22 0747 11/21/22 1604  BP: 108/67 (!) 146/86  122/71  Pulse: 63 76  81  Resp: 18 17  17   Temp: (!) 97.4 F (36.3 C) 97.8 F (36.6 C)  99.3 F (37.4 C)  TempSrc: Oral Oral  Oral  SpO2: 100% 100% 100% 99%  Weight:      Height:      In mild distress. Clear to auscultation. No edema. Bowel sound present.  Family Communication: No one at bedside  Data Reviewed: Reviewed BMP.  Reviewed CBG.     Author: Lynden Oxford, MD 11/21/2022 5:00 PM  To reach On-call, see care teams to locate the attending and reach out to them via [email or web address]. If 7PM-7AM, please contact night-coverage If you still have difficulty reaching the attending provider, please page the [facility] (Director on Call) for [facility] on amion for assistance.

## 2022-11-22 DIAGNOSIS — K863 Pseudocyst of pancreas: Secondary | ICD-10-CM | POA: Diagnosis not present

## 2022-11-22 LAB — CBC
HCT: 24.3 % — ABNORMAL LOW (ref 39.0–52.0)
Hemoglobin: 7.9 g/dL — ABNORMAL LOW (ref 13.0–17.0)
MCH: 30 pg (ref 26.0–34.0)
MCHC: 32.5 g/dL (ref 30.0–36.0)
MCV: 92.4 fL (ref 80.0–100.0)
Platelets: 455 10*3/uL — ABNORMAL HIGH (ref 150–400)
RBC: 2.63 MIL/uL — ABNORMAL LOW (ref 4.22–5.81)
RDW: 13.4 % (ref 11.5–15.5)
WBC: 13.9 10*3/uL — ABNORMAL HIGH (ref 4.0–10.5)
nRBC: 1 % — ABNORMAL HIGH (ref 0.0–0.2)

## 2022-11-22 LAB — RETICULOCYTES
Immature Retic Fract: 35 % — ABNORMAL HIGH (ref 2.3–15.9)
RBC.: 2.76 MIL/uL — ABNORMAL LOW (ref 4.22–5.81)
Retic Count, Absolute: 115.4 10*3/uL (ref 19.0–186.0)
Retic Ct Pct: 4.2 % — ABNORMAL HIGH (ref 0.4–3.1)

## 2022-11-22 LAB — CBC WITH DIFFERENTIAL/PLATELET
Abs Immature Granulocytes: 0.17 10*3/uL — ABNORMAL HIGH (ref 0.00–0.07)
Basophils Absolute: 0.1 10*3/uL (ref 0.0–0.1)
Basophils Relative: 1 %
Eosinophils Absolute: 0.5 10*3/uL (ref 0.0–0.5)
Eosinophils Relative: 4 %
HCT: 26.1 % — ABNORMAL LOW (ref 39.0–52.0)
Hemoglobin: 8.3 g/dL — ABNORMAL LOW (ref 13.0–17.0)
Immature Granulocytes: 1 %
Lymphocytes Relative: 15 %
Lymphs Abs: 1.8 10*3/uL (ref 0.7–4.0)
MCH: 29.5 pg (ref 26.0–34.0)
MCHC: 31.8 g/dL (ref 30.0–36.0)
MCV: 92.9 fL (ref 80.0–100.0)
Monocytes Absolute: 1 10*3/uL (ref 0.1–1.0)
Monocytes Relative: 8 %
Neutro Abs: 8.7 10*3/uL — ABNORMAL HIGH (ref 1.7–7.7)
Neutrophils Relative %: 71 %
Platelets: 500 10*3/uL — ABNORMAL HIGH (ref 150–400)
RBC: 2.81 MIL/uL — ABNORMAL LOW (ref 4.22–5.81)
RDW: 13.4 % (ref 11.5–15.5)
WBC: 12.3 10*3/uL — ABNORMAL HIGH (ref 4.0–10.5)
nRBC: 0.7 % — ABNORMAL HIGH (ref 0.0–0.2)

## 2022-11-22 LAB — AMYLASE, BODY FLUID (OTHER): Amylase, Body Fluid: 99 U/L

## 2022-11-22 LAB — BASIC METABOLIC PANEL
Anion gap: 9 (ref 5–15)
BUN: 11 mg/dL (ref 6–20)
CO2: 27 mmol/L (ref 22–32)
Calcium: 8.3 mg/dL — ABNORMAL LOW (ref 8.9–10.3)
Chloride: 101 mmol/L (ref 98–111)
Creatinine, Ser: 0.87 mg/dL (ref 0.61–1.24)
GFR, Estimated: >60 mL/min (ref >60)
Glucose, Bld: 149 mg/dL — ABNORMAL HIGH (ref 70–99)
Potassium: 3.8 mmol/L (ref 3.5–5.1)
Sodium: 137 mmol/L (ref 135–145)

## 2022-11-22 LAB — GLUCOSE, CAPILLARY
Glucose-Capillary: 144 mg/dL — ABNORMAL HIGH (ref 70–99)
Glucose-Capillary: 137 mg/dL — ABNORMAL HIGH (ref 70–99)
Glucose-Capillary: 119 mg/dL — ABNORMAL HIGH (ref 70–99)
Glucose-Capillary: 248 mg/dL — ABNORMAL HIGH (ref 70–99)
Glucose-Capillary: 216 mg/dL — ABNORMAL HIGH (ref 70–99)

## 2022-11-22 LAB — MAGNESIUM: Magnesium: 1.8 mg/dL (ref 1.7–2.4)

## 2022-11-22 LAB — TYPE AND SCREEN
ABO/RH(D): B NEG
Antibody Screen: NEGATIVE

## 2022-11-22 MED ORDER — MENINGOCOCCAL A C Y&W-135 OLIG IM SOLN
0.5000 mL | Freq: Once | INTRAMUSCULAR | Status: AC
  Administered 2022-11-22: 0.5 mL via INTRAMUSCULAR
  Filled 2022-11-22: qty 0.5

## 2022-11-22 MED ORDER — PNEUMOCOCCAL 20-VAL CONJ VACC 0.5 ML IM SUSY
0.5000 mL | PREFILLED_SYRINGE | Freq: Once | INTRAMUSCULAR | Status: AC
  Administered 2022-11-22: 0.5 mL via INTRAMUSCULAR
  Filled 2022-11-22: qty 0.5

## 2022-11-22 MED ORDER — HAEMOPHILUS B POLYSAC CONJ VAC IM SOLR
0.5000 mL | Freq: Once | INTRAMUSCULAR | Status: AC
  Administered 2022-11-22: 0.5 mL via INTRAMUSCULAR
  Filled 2022-11-22: qty 0.5

## 2022-11-22 MED ORDER — MENINGOCOCCAL VAC B (OMV) IM SUSY
0.5000 mL | PREFILLED_SYRINGE | Freq: Once | INTRAMUSCULAR | Status: AC
  Administered 2022-11-22: 0.5 mL via INTRAMUSCULAR
  Filled 2022-11-22: qty 0.5

## 2022-11-22 MED ORDER — INSULIN GLARGINE-YFGN 100 UNIT/ML ~~LOC~~ SOLN
20.0000 [IU] | Freq: Every day | SUBCUTANEOUS | Status: DC
  Administered 2022-11-22: 20 [IU] via SUBCUTANEOUS
  Filled 2022-11-22 (×2): qty 0.2

## 2022-11-22 NOTE — Progress Notes (Signed)
[facility] Consultation Progress Note  Patient: Steve Romero ZHY:[id number]   PCP: Raliegh Ip, DO DOB: 06-Jul-1971   DOA: 11/12/2022   DOS: 11/22/2022   Date of Service: the patient was seen and examined on 11/22/2022 Primary service: Fritzi Mandes, MD   Brief hospital course: PMH of type II DM, bipolar disorder, chronic pancreatitis with known pseudocyst, COPD presented to the hospital with complaints of abdominal pain was found to have acute on chronic pancreatitis with recurrent pancreatic pseudocyst.  GI and general surgery were consulted. Underwent open distal pancreatectomy with splenectomy and gastrorrhaphy on 5/14.  Assessment and Plan: Acute on chronic pancreatitis with recurrent pancreatic pseudocyst. Presents with abdominal pain. Lipase was 152. CT scan shows evidence of pancreatic pseudocyst up to 6.3 cm in size. GI was consulted.  Hepatobiliary surgery Dr. Clifton Custard was also consulted. Originally admitted to [facility] but transferred to [facility] for surgical intervention. On 5/14 underwent distal pancreatectomy and splenectomy.  Was on TPN. NG removed on 5/16.  Highly appreciate surgery assistance.  Type 2 diabetes mellitus, uncontrolled with hyperglycemia with long-term insulin use without any complication. Uses Toujeo at home. Currently on Semglee and sliding scale insulin.   I will increase the Semglee to 20 units.  Most likely home regimen will be 25 units will continue Premeal coverage obstruction  COPD. Currently stable.  No evidence of exacerbation. Continue Dulera and nebulizations. Patient will benefit from incentive spirometry.  Postoperative blood loss anemia. Baseline hemoglobin appears to be around 13. Hemoglobin currently around 8.3. No active bleeding.  Reticulocyte count elevated. If repeat H&H tomorrow is less than 8 we may need to consider further workup. Will check B12 and iron levels.  Leukocytosis. Postop. Monitoring.  We  will continue to follow the patient.    Subjective: Pain improving.  No nausea no vomiting.  Objective: Vitals:   11/22/22 0317 11/22/22 0442 11/22/22 0740 11/22/22 1603  BP: 128/78  (!) 152/88 129/85  Pulse: 71  79 83  Resp: 17  17 17   Temp: 99.3 F (37.4 C)  98.4 F (36.9 C) 98 F (36.7 C)  TempSrc: Oral  Oral   SpO2: 96%  98% 100%  Weight:  97.3 kg    Height:      Clear to auscultation.  No wheezing. S1-S2 present. Trace edema on bilateral lower extremity  Family Communication: No one at bedside  Data Reviewed: Reviewed BMP.  Reviewed CBG.     Author: Lynden Oxford, MD 11/22/2022 6:54 PM  To reach On-call, see care teams to locate the attending and reach out to them via [email or web address]. If 7PM-7AM, please contact night-coverage If you still have difficulty reaching the attending provider, please page the [facility] (Director on Call) for [facility] on amion for assistance.

## 2022-11-22 NOTE — Plan of Care (Signed)

## 2022-11-22 NOTE — Progress Notes (Signed)
    5 Days Post-Op  Subjective: Tolerating regular diet. Having bowel movements. Having some pain today.   Objective: Vital signs in last 24 hours: Temp:  [97.8 F (36.6 C)-99.3 F (37.4 C)] 99.3 F (37.4 C) (05/19 0317) Pulse Rate:  [71-93] 71 (05/19 0317) Resp:  [16-17] 17 (05/19 0317) BP: (122-146)/(71-86) 128/78 (05/19 0317) SpO2:  [95 %-100 %] 96 % (05/19 0317) Weight:  [97.3 kg] 97.3 kg (05/19 0442) Last BM Date : 11/21/22  Intake/Output from previous day: 05/18 0701 - 05/19 0700 In: 480 [P.O.:480] Out: 2290 [Urine:2175; Drains:115] Intake/Output this shift: No intake/output data recorded.  PE: General: resting comfortably, NAD Neuro: alert and oriented, no focal deficits Resp: normal work of breathing CV: RRR Abdomen: soft, mildly distended, nontender to palpation. Midline incision clean and dry with no erythema or induration. Staples in place, honeycomb dressing is clean. JP with serosanguinous drainage. Extremities: warm and well-perfused    Lab Results:  Recent Labs    11/22/22 0324  WBC 13.9*  HGB 7.9*  HCT 24.3*  PLT 455*   BMET Recent Labs    11/20/22 0323 11/21/22 0405  NA 139 138  K 3.8 4.0  CL 105 104  CO2 27 29  GLUCOSE 178* 123*  BUN 15 10  CREATININE 0.82 0.74  CALCIUM 7.6* 8.0*   PT/INR No results for input(s): "LABPROT", "INR" in the last 72 hours. CMP     Component Value Date/Time   NA 138 11/21/2022 0405   NA 139 09/25/2022 0837   K 4.0 11/21/2022 0405   CL 104 11/21/2022 0405   CO2 29 11/21/2022 0405   GLUCOSE 123 (H) 11/21/2022 0405   BUN 10 11/21/2022 0405   BUN 17 09/25/2022 0837   CREATININE 0.74 11/21/2022 0405   CREATININE 0.55 (L) 01/13/2021 1109   CALCIUM 8.0 (L) 11/21/2022 0405   PROT 4.6 (L) 11/19/2022 0309   PROT 6.3 10/14/2022 0816   ALBUMIN 2.3 (L) 11/19/2022 0309   ALBUMIN 4.3 10/14/2022 0816   AST 12 (L) 11/19/2022 0309   ALT 19 11/19/2022 0309   ALKPHOS 114 11/19/2022 0309   BILITOT 0.3  11/19/2022 0309   BILITOT 0.5 10/14/2022 0816   GFRNONAA >60 11/21/2022 0405   GFRNONAA 89 04/22/2015 0828   GFRAA 85 10/04/2019 1433   GFRAA >89 04/22/2015 0828   Lipase     Component Value Date/Time   LIPASE 108 (H) 11/16/2022 0440       Studies/Results: No results found.      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 POD5 s/p open distal pancreatectomy with splenectomy and gastrorrhaphy x2. - Advance to carb control diet - Bowel regimen - Multimodal pain control - Hyperglycemia: on sliding scale and glargine, appreciate hospitalist assistance. - COPD: Dulera, Incruse and duonebs per hospitalist.  - Mobilize, ambulate TID - POD3 drain amylase pending - VTE: lovenox, SCDs - Dispo: inpatient, med-surg floor. Plan for discharge home tomorrow.    LOS: 10 days    Steve Simas, MD [facility] Surgery General, Hepatobiliary and Pancreatic Surgery 11/22/22 7:28 AM

## 2022-11-22 NOTE — Progress Notes (Signed)
PRN oxy given per pt request 

## 2022-11-22 NOTE — Discharge Instructions (Addendum)
[facility] DISCHARGE INSTRUCTIONS  Activity No heavy lifting greater than 15 pounds for 4 weeks after surgery. Ok to shower in 24 hours, but do not bathe or submerge incisions underwater. Do not drive while taking narcotic pain medication.  Wound Care Your incisions are covered with skin glue called Dermabond. This will peel off on its own over time. You may shower and allow warm soapy water to run over your incisions. Gently pat dry. Do not submerge your incision underwater. Monitor your incision for any new redness, tenderness, or drainage.  When to Call us: Fever greater than 100.5 New redness, drainage, or swelling at incision site Severe pain, nausea, or vomiting Jaundice (yellowing of the whites of the eyes or skin)  Follow-up - You will be scheduled for a nurse visit on May 28 to have your staples removed. - You will have an appointment scheduled with Dr. Freida Busman in 3-4 weeks for your postop visit.  - Both of these appointments will be at the [facility] Surgery office at [address], [address], [place], [place]. Please arrive at least 15 minutes prior to your scheduled appointment time.  For questions or concerns, please call the office at [phone].

## 2022-11-22 NOTE — Progress Notes (Signed)
Order put in for IV team to come draw labs from PICC per lab tech

## 2022-11-23 LAB — VITAMIN B12: Vitamin B-12: 270 pg/mL (ref 180–914)

## 2022-11-23 LAB — CBC WITH DIFFERENTIAL/PLATELET
Abs Immature Granulocytes: 0.19 10*3/uL — ABNORMAL HIGH (ref 0.00–0.07)
Basophils Absolute: 0.1 10*3/uL (ref 0.0–0.1)
Basophils Relative: 1 %
Eosinophils Absolute: 0.7 10*3/uL — ABNORMAL HIGH (ref 0.0–0.5)
Eosinophils Relative: 5 %
HCT: 24.3 % — ABNORMAL LOW (ref 39.0–52.0)
Hemoglobin: 7.9 g/dL — ABNORMAL LOW (ref 13.0–17.0)
Immature Granulocytes: 1 %
Lymphocytes Relative: 16 %
Lymphs Abs: 2.1 10*3/uL (ref 0.7–4.0)
MCH: 30.6 pg (ref 26.0–34.0)
MCHC: 32.5 g/dL (ref 30.0–36.0)
MCV: 94.2 fL (ref 80.0–100.0)
Monocytes Absolute: 1.4 10*3/uL — ABNORMAL HIGH (ref 0.1–1.0)
Monocytes Relative: 10 %
Neutro Abs: 9.3 10*3/uL — ABNORMAL HIGH (ref 1.7–7.7)
Neutrophils Relative %: 67 %
Platelets: 526 10*3/uL — ABNORMAL HIGH (ref 150–400)
RBC: 2.58 MIL/uL — ABNORMAL LOW (ref 4.22–5.81)
RDW: 13.5 % (ref 11.5–15.5)
WBC: 13.7 10*3/uL — ABNORMAL HIGH (ref 4.0–10.5)
nRBC: 0.7 % — ABNORMAL HIGH (ref 0.0–0.2)

## 2022-11-23 LAB — IRON AND TIBC
Iron: 12 ug/dL — ABNORMAL LOW (ref 45–182)
Saturation Ratios: 6 % — ABNORMAL LOW (ref 17.9–39.5)
TIBC: 218 ug/dL — ABNORMAL LOW (ref 250–450)
UIBC: 206 ug/dL

## 2022-11-23 LAB — GLUCOSE, CAPILLARY
Glucose-Capillary: 128 mg/dL — ABNORMAL HIGH (ref 70–99)
Glucose-Capillary: 100 mg/dL — ABNORMAL HIGH (ref 70–99)

## 2022-11-23 LAB — FERRITIN: Ferritin: 176 ng/mL (ref 24–336)

## 2022-11-23 LAB — FOLATE: Folate: 5.4 ng/mL — ABNORMAL LOW (ref >5.9)

## 2022-11-23 MED ORDER — OXYCODONE HCL 10 MG PO TABS: 10.0000 mg | ORAL_TABLET | ORAL | 0 refills | Status: AC | PRN

## 2022-11-23 MED ORDER — FERROUS SULFATE 325 (65 FE) MG PO TBEC: 325.0000 mg | DELAYED_RELEASE_TABLET | Freq: Every day | ORAL | 0 refills | Status: DC

## 2022-11-23 MED ORDER — TOUJEO SOLOSTAR 300 UNIT/ML ~~LOC~~ SOPN
24.0000 [IU] | PEN_INJECTOR | Freq: Every day | SUBCUTANEOUS | 0 refills | Status: DC
Start: 2022-11-23 — End: 2022-11-25

## 2022-11-23 MED ORDER — UMECLIDINIUM BROMIDE 62.5 MCG/ACT IN AEPB: 1.0000 | INHALATION_SPRAY | Freq: Every day | RESPIRATORY_TRACT | 0 refills | Status: DC

## 2022-11-23 MED ORDER — FOLIC ACID 5 MG/ML IJ SOLN
1.0000 mg | Freq: Once | INTRAMUSCULAR | Status: AC
  Administered 2022-11-23: 1 mg via INTRAVENOUS
  Filled 2022-11-23: qty 0.2

## 2022-11-23 MED ORDER — VITAMIN B-12 1000 MCG PO TABS: 1000.0000 ug | ORAL_TABLET | Freq: Every day | ORAL | 0 refills | Status: DC

## 2022-11-23 MED ORDER — ACETAMINOPHEN 500 MG PO TABS: 1000.0000 mg | ORAL_TABLET | Freq: Three times a day (TID) | ORAL | 0 refills | Status: DC | PRN

## 2022-11-23 MED ORDER — POLYETHYLENE GLYCOL 3350 17 G PO PACK: 17.0000 g | PACK | Freq: Every day | ORAL | 0 refills | Status: DC | PRN

## 2022-11-23 MED ORDER — CYANOCOBALAMIN 1000 MCG/ML IJ SOLN
1000.0000 ug | Freq: Once | INTRAMUSCULAR | Status: AC
  Administered 2022-11-23: 1000 ug via INTRAMUSCULAR
  Filled 2022-11-23: qty 1

## 2022-11-23 MED ORDER — SODIUM CHLORIDE 0.9 % IV SOLN
1.0000 mg | Freq: Once | INTRAVENOUS | Status: DC
  Filled 2022-11-23: qty 0.2

## 2022-11-23 MED ORDER — MOMETASONE FURO-FORMOTEROL FUM 200-5 MCG/ACT IN AERO: 2.0000 | INHALATION_SPRAY | Freq: Two times a day (BID) | RESPIRATORY_TRACT | 1 refills | Status: DC

## 2022-11-23 MED ORDER — METHOCARBAMOL 1000 MG PO TABS: 1000.0000 mg | ORAL_TABLET | Freq: Three times a day (TID) | ORAL | 0 refills | Status: DC | PRN

## 2022-11-23 MED ORDER — FOLIC ACID 1 MG PO TABS: 1.0000 mg | ORAL_TABLET | Freq: Every day | ORAL | 0 refills | Status: DC

## 2022-11-23 MED ORDER — SODIUM CHLORIDE 0.9 % IV SOLN
250.0000 mg | Freq: Once | INTRAVENOUS | Status: AC
  Administered 2022-11-23: 250 mg via INTRAVENOUS
  Filled 2022-11-23 (×2): qty 20

## 2022-11-23 MED ORDER — ALBUTEROL SULFATE HFA 108 (90 BASE) MCG/ACT IN AERS: 2.0000 | INHALATION_SPRAY | Freq: Four times a day (QID) | RESPIRATORY_TRACT | 1 refills | Status: DC | PRN

## 2022-11-23 MED ORDER — GLUCERNA SHAKE PO LIQD: 237.0000 mL | Freq: Two times a day (BID) | ORAL | 0 refills | Status: DC

## 2022-11-23 MED ORDER — DOCUSATE SODIUM 100 MG PO CAPS: 100.0000 mg | ORAL_CAPSULE | Freq: Two times a day (BID) | ORAL | 0 refills | Status: DC

## 2022-11-23 NOTE — Progress Notes (Signed)
Pt discharged to home in stable condition. All discharge instructions and prescriptions reviewed with and given to pt. Pt verbalizes complete understanding of discharge instructions and to pick up prescriptions at the designated pharmacy.Pt refused wheelchair transport off unit, pt ambulated with belongings off unit to private vehicle for ride home, with this RN at side.

## 2022-11-23 NOTE — Progress Notes (Signed)
Awaiting IV Ferrlecit from pharmacy so can administer and then pt can go home after that.Pt has all equipment needed in his room already for home. Pt already knows how to monitor his CBG at home and how to use the insulin pen.

## 2022-11-23 NOTE — Progress Notes (Signed)
Ferrlecit almost completed, will await IV team for PICC line removal and then pt can be discharged.

## 2022-11-23 NOTE — TOC Progression Note (Signed)
Transition of Care [facility]) - Progression Note    Patient Details  Name: Steve Romero MRN: [id number] Date of Birth: Aug 26, 1970  Transition of Care [facility]) CM/SW Contact  Nadene Rubins Adria Devon, RN Phone Number: 11/23/2022, 9:55 AM  Clinical Narrative:      Followed up with patient. He has decided he does not want OP PT at present. He will be staying with his parents. Home walker and 3 in 1 at bedside.  Expected Discharge Plan: Home w Home Health Services Barriers to Discharge: Continued Medical Work up  Expected Discharge Plan and Services   Discharge Planning Services: CM Consult Post Acute Care Choice: Home Health, Durable Medical Equipment Living arrangements for the past 2 months: Single Family Home Expected Discharge Date: 11/23/22               DME Arranged: 3-N-1, Dan Humphreys rolling DME Agency: [facility] spoke with at DME Agency: Vaughan Basta HH Arranged: PT           Social Determinants of Health (SDOH) Interventions SDOH Screenings   Food Insecurity: No Food Insecurity (11/16/2022)  Housing: Low Risk  (11/16/2022)  Transportation Needs: No Transportation Needs (11/16/2022)  Utilities: Not At Risk (11/16/2022)  Depression (PHQ2-9): Low Risk  (09/25/2022)  Tobacco Use: High Risk (11/18/2022)    Readmission Risk Interventions    06/16/2022    8:03 AM  Readmission Risk Prevention Plan  Post Dischage Appt Complete  Medication Screening Complete  Transportation Screening Complete

## 2022-11-24 ENCOUNTER — Telehealth: Payer: Self-pay

## 2022-11-24 LAB — SURGICAL PATHOLOGY

## 2022-11-24 NOTE — Transitions of Care (Post Inpatient/ED Visit) (Signed)
   11/24/2022  Name: Steve Romero MRN: [id number] DOB: [birth year]  Today's TOC FU Call Status: Today's TOC FU Call Status:: Unsuccessul Call (1st Attempt) Unsuccessful Call (1st Attempt) Date: 11/24/22  Attempted to reach the patient regarding the most recent Inpatient/ED visit.  Follow Up Plan: Additional outreach attempts will be made to reach the patient to complete the Transitions of Care (Post Inpatient/ED visit) call.  Karena Addison, LPN [facility] Nurse Health Advisor Direct Dial [phone]  Signature Karena Addison, LPN [facility] Nurse Health Advisor Direct Dial [phone]

## 2022-11-25 ENCOUNTER — Telehealth: Payer: Self-pay | Admitting: Family Medicine

## 2022-11-25 DIAGNOSIS — E119 Type 2 diabetes mellitus without complications: Secondary | ICD-10-CM

## 2022-11-25 MED ORDER — TOUJEO SOLOSTAR 300 UNIT/ML ~~LOC~~ SOPN
24.0000 [IU] | PEN_INJECTOR | Freq: Every day | SUBCUTANEOUS | 3 refills | Status: DC
Start: 2022-11-25 — End: 2022-12-07

## 2022-11-25 NOTE — Telephone Encounter (Signed)
Please advie 11/23/22 insulin changed by Dr. Allena Katz

## 2022-11-25 NOTE — Transitions of Care (Post Inpatient/ED Visit) (Signed)
11/25/2022  Name: Gavriel Margeson MRN: [id number] DOB: May 13, 1971  Today's TOC FU Call Status: Today's TOC FU Call Status:: Successful TOC FU Call Competed Unsuccessful Call (1st Attempt) Date: 11/24/22 [facility] FU Call Complete Date: 11/25/22  Transition Care Management Follow-up Telephone Call Date of Discharge: 11/23/22 Discharge Facility: [facility]) Type of Discharge: Inpatient Admission Primary Inpatient Discharge Diagnosis:: pseudocust of pancreas How have you been since you were released from the hospital?: Better Any questions or concerns?: No  Items Reviewed: Did you receive and understand the discharge instructions provided?: Yes Medications obtained,verified, and reconciled?: Yes (Medications Reviewed) Any new allergies since your discharge?: No Dietary orders reviewed?: Yes Do you have support at home?: Yes People in Home: parent(s)  Medications Reviewed Today: Medications Reviewed Today     Reviewed by Karena Addison, LPN (Licensed Practical Nurse) on 11/25/22 at 1511  Med List Status: <None>   Medication Order Taking? Sig Documenting Provider Last Dose Status Informant  acetaminophen (TYLENOL) 500 MG tablet [id number] Yes Take 2 tablets (1,000 mg total) by mouth every 8 (eight) hours as needed for mild pain. Fritzi Mandes, MD Taking Active   albuterol (VENTOLIN HFA) 108 (90 Base) MCG/ACT inhaler [id number] Yes Inhale 2 puffs into the lungs every 6 (six) hours as needed for wheezing or shortness of breath. Rolly Salter, MD Taking Active   Continuous Blood Gluc Receiver (FREESTYLE LIBRE READER) [place] [id number] Yes 1 Units by Does not apply route daily. UAD to test BGs daily. Dx E11.9 Raliegh Ip, DO Taking Active Self  Continuous Blood Gluc Sensor (FREESTYLE LIBRE 3 SENSOR) [place] [id number] Yes Place 1 sensor on the skin every 14 days. Use to check glucose continuously e11.9 Delynn Flavin M, DO Taking Active Self  cyanocobalamin (VITAMIN B12) 1000 MCG  tablet [id number] Yes Take 1 tablet (1,000 mcg total) by mouth daily. Rolly Salter, MD Taking Active   docusate sodium (COLACE) 100 MG capsule [id number] Yes Take 1 capsule (100 mg total) by mouth 2 (two) times daily. Fritzi Mandes, MD Taking Active   feeding supplement, GLUCERNA SHAKE, (GLUCERNA SHAKE) LIQD [id number] Yes Take 237 mLs by mouth 2 (two) times daily between meals. Rolly Salter, MD Taking Active   ferrous sulfate 325 (65 FE) MG EC tablet [id number] Yes Take 1 tablet (325 mg total) by mouth daily with breakfast. Rolly Salter, MD Taking Active   folic acid (FOLVITE) 1 MG tablet [id number] Yes Take 1 tablet (1 mg total) by mouth daily. Rolly Salter, MD Taking Active   insulin glargine, 1 Unit Dial, (TOUJEO SOLOSTAR) 300 UNIT/ML Solostar Pen [id number] Yes Inject 24 Units into the skin at bedtime. Rolly Salter, MD Taking Active   Insulin Pen Needle (BD PEN NEEDLE NANO U/F) 32G X 4 MM MISC [id number] Yes UAD to administer insulin Delynn Flavin M, DO Taking Active Self  lipase/protease/amylase (CREON) 36000 UNITS CPEP capsule [id number] Yes Take 2 capsules (72,000 Units total) by mouth 3 (three) times daily with meals.  Patient taking differently: Take 72,000-108,000 Units by mouth in the morning and at bedtime.   Shalhoub, Deno Lunger, MD Taking Active Self, Pharmacy Records           Med Note Epimenio Sarin, Madelon Lips Nov 12, 2022  1:39 PM) No fill hx found for this medication. Pt is adamant he still has it at home and is taking it.   methocarbamol 1000 MG TABS [id number] Yes Take 1,000 mg by mouth every 8 (  eight) hours as needed for muscle spasms. Fritzi Mandes, MD Taking Active   mometasone-formoterol [facility]) 200-5 MCG/ACT Sandrea Matte [id number] Yes Inhale 2 puffs into the lungs 2 (two) times daily. Rolly Salter, MD Taking Active   ondansetron (ZOFRAN-ODT) 4 MG disintegrating tablet [id number] Yes Take 1 tablet (4 mg total) by mouth every 8 (eight) hours as needed for nausea or vomiting.  Dettinger, Elige Radon, MD Taking Active Self, Pharmacy Records  oxyCODONE 10 MG TABS [id number] Yes Take 1 tablet (10 mg total) by mouth every 4 (four) hours as needed for up to 7 days for severe pain. Fritzi Mandes, MD Taking Active   polyethylene glycol (MIRALAX / GLYCOLAX) 17 g packet [id number] Yes Take 17 g by mouth daily as needed for mild constipation. Fritzi Mandes, MD Taking Active   umeclidinium bromide (INCRUSE ELLIPTA) 62.5 MCG/ACT AEPB [id number] Yes Inhale 1 puff into the lungs daily. Rolly Salter, MD Taking Active             Home Care and Equipment/Supplies: Were Home Health Services Ordered?: NA Any new equipment or medical supplies ordered?: NA  Functional Questionnaire: Do you need assistance with bathing/showering or dressing?: No Do you need assistance with meal preparation?: No Do you need assistance with eating?: No Do you have difficulty maintaining continence: No Do you need assistance with getting out of bed/getting out of a chair/moving?: No Do you have difficulty managing or taking your medications?: No  Follow up appointments reviewed: PCP Follow-up appointment confirmed?: No (no avail appt, sent message to staff to schedule) MD Provider Line [phone] Given: No Specialist Hospital Follow-up appointment confirmed?: NA Do you need transportation to your follow-up appointment?: No Do you understand care options if your condition(s) worsen?: Yes-patient verbalized understanding    SIGNATURE Karena Addison, LPN [facility] Nurse Health Advisor Direct Dial [phone]

## 2022-11-25 NOTE — Telephone Encounter (Signed)
done

## 2022-11-25 NOTE — Discharge Summary (Signed)
Physician Discharge Summary   Patient ID: Steve Romero [id number] 51 y.o. 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 He subsequently underwent an ERCP and developed post-ERCP necrotizing pancreatitis. He developed several long-term sequelae from this including a distal CBD stricture, which was treated with endoscopic stenting and balloon dilation. 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 He has had three previous endoscopic cystgastrostomy procedures, most recently in December 2023, and the pseudocyst has recurred each time. The patient was followed as outpatient with serial imaging, and presented to the ED on 5/9 with worsening abdominal pain and vomiting. A CT scan showed recurrence and significant enlargement of a pseudocyst in the neck/body of the pancreas.   Hospital Course: The patient was admitted to the hospitalist service at [facility] and surgery and GI were consulted. The patient had gastric outlet obstructive symptoms from his pseudocyst and was started on TPN for nutritional support. He agreed to surgical treatment of his pseudocyst and was transferred to [facility] for surgery. He was taken to the OR on 11/17/22 for an open distal pancreatectomy, splenectomy and gastrorraphy. For details of this procedure, please see separately dictated operative note. He was observed overnight in the ICU postoperatively. On POD1 his foley and central line were removed, and he was transferred to the med-surg floor. NG tube  was removed on POD2 and he was advanced to a clear liquid diet. He was advanced to a full liquid diet and then a soft diet over the next few days, which he tolerated with no nausea or vomiting. TPN was discontinued. He was transitioned off a dilaudid PCA to oral pain medications. He worked with PT and OT and progressed to independent ambulation. The medicine service continued to follow for assistance with management of insulin and COPD. His POD3 drain amylase was low, and his surgical drain was removed on the day of discharge. On the morning of POD6, he was ambulating, tolerating a regular diet, having bowel function, and pain was controlled on oral medications. He was examined and deemed appropriate for discharge home.  Consults:  Internal medicine  Significant Diagnostic Studies: Surgical Pathology (pending at time of discharge): A. 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 Upper midline incision clean and dry with staples in place. Extremities: warm and well-perfused   Disposition: Discharge disposition: 01-Home or Self Care       Patient Instructions:  Allergies as of 11/23/2022       Reactions   Desipramine Nausea Only, Rash        Medication List  STOP taking these medications    traMADol 50 MG tablet Commonly known as: ULTRAM       TAKE  these medications    acetaminophen 500 MG tablet Commonly known as: TYLENOL Take 2 tablets (1,000 mg total) by mouth every 8 (eight) hours as needed for mild pain.   albuterol 108 (90 Base) MCG/ACT inhaler Commonly known as: VENTOLIN HFA Inhale 2 puffs into the lungs every 6 (six) hours as needed for wheezing or shortness of breath.   BD Pen Needle Nano U/F 32G X 4 MM Misc Generic drug: Insulin Pen Needle UAD to administer insulin   cyanocobalamin 1000 MCG tablet Commonly known as: VITAMIN B12 Take 1 tablet (1,000 mcg total) by mouth daily.   docusate sodium 100 MG capsule Commonly known as: COLACE Take 1 capsule (100 mg total) by mouth 2 (two) times daily.   feeding supplement (GLUCERNA SHAKE) Liqd Take 237 mLs by mouth 2 (two) times daily between meals.   ferrous sulfate 325 (65 FE) MG EC tablet Take 1 tablet (325 mg total) by mouth daily with breakfast.   folic acid 1 MG tablet Commonly known as: FOLVITE Take 1 tablet (1 mg total) by mouth daily.   FreeStyle Libre 3 Sensor Misc Place 1 sensor on the skin every 14 days. Use to check glucose continuously e11.9   FreeStyle Libre Reader Devi 1 Units by Does not apply route daily. UAD to test BGs daily. Dx E11.9   lipase/protease/amylase 16109 UNITS Cpep capsule Commonly known as: CREON Take 2 capsules (72,000 Units total) by mouth 3 (three) times daily with meals. What changed:  how much to take when to take this   Methocarbamol 1000 MG Tabs Take 1,000 mg by mouth every 8 (eight) hours as needed for muscle spasms.   mometasone-formoterol 200-5 MCG/ACT Aero Commonly known as: DULERA Inhale 2 puffs into the lungs 2 (two) times daily.   ondansetron 4 MG disintegrating tablet Commonly known as: ZOFRAN-ODT Take 1 tablet (4 mg total) by mouth every 8 (eight) hours as needed for nausea or vomiting.   Oxycodone HCl 10 MG Tabs Take 1 tablet (10 mg total) by mouth every 4 (four) hours as needed for up to 7 days for  severe pain.   polyethylene glycol 17 g packet Commonly known as: MIRALAX / GLYCOLAX Take 17 g by mouth daily as needed for mild constipation.   Toujeo SoloStar 300 UNIT/ML Solostar Pen Generic drug: insulin glargine (1 Unit Dial) Inject 24 Units into the skin at bedtime. What changed: how much to take   umeclidinium bromide 62.5 MCG/ACT Aepb Commonly known as: INCRUSE ELLIPTA Inhale 1 puff into the lungs daily.       Activity: no driving while on analgesics and no heavy lifting for 8 weeks Diet: regular diet Wound Care: keep wound clean and dry  Follow-up on 5/28 for staple removal in [facility] office. Follow up with Dr. Freida Busman on 6/26.  Signed: Fritzi Mandes 11/25/2022 11:46 AM

## 2022-12-01 ENCOUNTER — Telehealth: Payer: Self-pay | Admitting: Family Medicine

## 2022-12-01 NOTE — Telephone Encounter (Signed)
Attempted to call pt , no answer - no vm set up  

## 2022-12-01 NOTE — Telephone Encounter (Signed)
I'm very confused.  This was already addressed last week. Were they not able to get the rx?

## 2022-12-07 ENCOUNTER — Other Ambulatory Visit (HOSPITAL_COMMUNITY): Payer: Self-pay

## 2022-12-07 ENCOUNTER — Telehealth: Payer: Self-pay | Admitting: Family Medicine

## 2022-12-07 DIAGNOSIS — E119 Type 2 diabetes mellitus without complications: Secondary | ICD-10-CM

## 2022-12-07 MED ORDER — LANTUS SOLOSTAR 100 UNIT/ML ~~LOC~~ SOPN
24.0000 [IU] | PEN_INJECTOR | Freq: Every day | SUBCUTANEOUS | 5 refills | Status: DC
Start: 2022-12-07 — End: 2022-12-28

## 2022-12-07 MED ORDER — TOUJEO SOLOSTAR 300 UNIT/ML ~~LOC~~ SOPN
24.0000 [IU] | PEN_INJECTOR | Freq: Every day | SUBCUTANEOUS | 99 refills | Status: DC
Start: 2022-12-07 — End: 2022-12-07

## 2022-12-07 NOTE — Telephone Encounter (Signed)
PT has medicaid, Toujeo is a non preferred drug. In order to have coverage, pt must have trial/failure of at least ONE of the following: Insulin glargine vial, Lantus solostar/vial, or Levemir FlexPen/FlexTouch/Vial. Please change if appropriate or advise as to which of the provided preferred medications has been tried/failed. Thanks

## 2022-12-07 NOTE — Telephone Encounter (Signed)
The question was that 4.5 is one pen and they can not break a box, today's Rx is for 2 boxes which is ok but the Rx now needs a PA for his Toujeo

## 2022-12-07 NOTE — Telephone Encounter (Signed)
My understanding is there is 300units per 1.76ml He uses 24 units.  1 pen will last 12.5 days.  In order for 1 month supply 3 pens = 4.35mL no?  I'm confused as to why they are confused.  Is he needing a 27month supply?  If so I have sent it in.  Please ask them to be more clear as to what the issue is with him getting his medication.  Meds ordered this encounter  Medications   insulin glargine, 1 Unit Dial, (TOUJEO SOLOSTAR) 300 UNIT/ML Solostar Pen    Sig: Inject 24 Units into the skin at bedtime.    Dispense:  12 mL    Refill:  PRN

## 2022-12-07 NOTE — Telephone Encounter (Signed)
Please inform pt that the medication has been changed to Lantus per insurance will not cover without having tried formulary alternatives.  Still same 24u daily as directed,  Meds ordered this encounter  Medications   DISCONTD: insulin glargine, 1 Unit Dial, (TOUJEO SOLOSTAR) 300 UNIT/ML Solostar Pen    Sig: Inject 24 Units into the skin at bedtime.    Dispense:  12 mL    Refill:  PRN   insulin glargine (LANTUS SOLOSTAR) 100 UNIT/ML Solostar Pen    Sig: Inject 24 Units into the skin daily. TO REPLACE TOUJEO    Dispense:  15 mL    Refill:  5

## 2022-12-07 NOTE — Addendum Note (Signed)
Addended by: Raliegh Ip on: 12/07/2022 04:25 PM   Modules accepted: Orders

## 2022-12-07 NOTE — Telephone Encounter (Signed)
Fax from [facility] pharmacy RE: Toujeo 24 u QHS qty 4.5 ml w/ 3 RFs Question on qty for insulin

## 2022-12-08 NOTE — Telephone Encounter (Signed)
Pt aware Lantus sent in to replace the Toujeo.

## 2022-12-15 ENCOUNTER — Telehealth: Payer: Self-pay | Admitting: Family Medicine

## 2022-12-25 ENCOUNTER — Telehealth: Payer: Self-pay

## 2022-12-25 NOTE — Progress Notes (Signed)
  Care Coordination Note  12/25/2022 Name: Steve Romero MRN: [id number] DOB: 08-Jul-1970  Steve Romero is a 52 y.o. year old male who is a primary care patient of Raliegh Ip, DO and is actively engaged with the Chronic Care Management team. I reached out to Steve Romero by phone today to assist with scheduling a follow up visit with the Pharmacist  Follow up plan: Face to Face appointment with care management team member scheduled for: 01/05/2023  Steve Romero, RMA Care Guide [name]  [place], [place] [address] Direct Dial: [phone] [email or web address]

## 2022-12-25 NOTE — Progress Notes (Signed)
  Care Coordination Note  12/25/2022 Name: Steve Romero MRN: [id number] DOB: 1971-06-26  Steve Romero is a 52 y.o. year old male who is a primary care patient of Raliegh Ip, DO and is actively engaged with the Chronic Care Management team. I reached out to Steve Romero by phone today to assist with scheduling a follow up visit with the Pharmacist  Follow up plan: Unsuccessful telephone outreach attempt made. A HIPAA compliant phone message was left for the patient providing contact information and requesting a return call.  The care management team will reach out to the patient again over the next 7 days.  If patient returns call to provider office, please advise to call C[facility] Care Guide Steve Romero  at [phone]  Steve Romero, RMA Care Guide [facility]  [place], [place] [address] Direct Dial: [phone] [email or web address]

## 2022-12-28 ENCOUNTER — Ambulatory Visit: Payer: Medicaid Other | Admitting: Family Medicine

## 2022-12-28 ENCOUNTER — Encounter: Payer: Self-pay | Admitting: Family Medicine

## 2022-12-28 ENCOUNTER — Ambulatory Visit: Payer: Medicaid Other

## 2022-12-28 ENCOUNTER — Other Ambulatory Visit: Payer: Self-pay

## 2022-12-28 ENCOUNTER — Emergency Department (HOSPITAL_COMMUNITY): Payer: Medicaid Other

## 2022-12-28 ENCOUNTER — Observation Stay (HOSPITAL_COMMUNITY)
Admission: EM | Admit: 2022-12-28 | Discharge: 2022-12-30 | Disposition: A | Discharge status: Home / Self Care | Payer: Medicaid Other | Attending: Internal Medicine | Admitting: Internal Medicine

## 2022-12-28 ENCOUNTER — Encounter (HOSPITAL_COMMUNITY): Payer: Self-pay | Admitting: *Deleted

## 2022-12-28 VITALS — BP 137/87 | HR 76 | Temp 97.7°F | Resp 20 | Ht 70.0 in | Wt 192.0 lb

## 2022-12-28 DIAGNOSIS — F17210 Nicotine dependence, cigarettes, uncomplicated: Secondary | ICD-10-CM | POA: Diagnosis not present

## 2022-12-28 DIAGNOSIS — R072 Precordial pain: Principal | ICD-10-CM

## 2022-12-28 DIAGNOSIS — R079 Chest pain, unspecified: Secondary | ICD-10-CM | POA: Diagnosis present

## 2022-12-28 DIAGNOSIS — E1159 Type 2 diabetes mellitus with other circulatory complications: Secondary | ICD-10-CM

## 2022-12-28 DIAGNOSIS — Z720 Tobacco use: Secondary | ICD-10-CM

## 2022-12-28 DIAGNOSIS — I10 Essential (primary) hypertension: Secondary | ICD-10-CM | POA: Diagnosis not present

## 2022-12-28 DIAGNOSIS — Z8709 Personal history of other diseases of the respiratory system: Secondary | ICD-10-CM

## 2022-12-28 DIAGNOSIS — E119 Type 2 diabetes mellitus without complications: Secondary | ICD-10-CM

## 2022-12-28 DIAGNOSIS — Z794 Long term (current) use of insulin: Secondary | ICD-10-CM | POA: Insufficient documentation

## 2022-12-28 DIAGNOSIS — Z79899 Other long term (current) drug therapy: Secondary | ICD-10-CM | POA: Diagnosis not present

## 2022-12-28 DIAGNOSIS — J449 Chronic obstructive pulmonary disease, unspecified: Secondary | ICD-10-CM | POA: Diagnosis not present

## 2022-12-28 DIAGNOSIS — B351 Tinea unguium: Secondary | ICD-10-CM

## 2022-12-28 DIAGNOSIS — H539 Unspecified visual disturbance: Secondary | ICD-10-CM

## 2022-12-28 DIAGNOSIS — I152 Hypertension secondary to endocrine disorders: Secondary | ICD-10-CM

## 2022-12-28 DIAGNOSIS — Z9689 Presence of other specified functional implants: Secondary | ICD-10-CM | POA: Diagnosis not present

## 2022-12-28 DIAGNOSIS — K8689 Other specified diseases of pancreas: Secondary | ICD-10-CM | POA: Diagnosis present

## 2022-12-28 LAB — CBC
HCT: 41.5 % (ref 39.0–52.0)
Hemoglobin: 13.1 g/dL (ref 13.0–17.0)
MCH: 28.9 pg (ref 26.0–34.0)
MCHC: 31.6 g/dL (ref 30.0–36.0)
MCV: 91.4 fL (ref 80.0–100.0)
Platelets: 695 10*3/uL — ABNORMAL HIGH (ref 150–400)
RBC: 4.54 MIL/uL (ref 4.22–5.81)
RDW: 13.1 % (ref 11.5–15.5)
WBC: 13.9 10*3/uL — ABNORMAL HIGH (ref 4.0–10.5)
nRBC: 0 % (ref 0.0–0.2)

## 2022-12-28 LAB — BASIC METABOLIC PANEL
Anion gap: 11 (ref 5–15)
BUN: 12 mg/dL (ref 6–20)
CO2: 25 mmol/L (ref 22–32)
Calcium: 8.9 mg/dL (ref 8.9–10.3)
Chloride: 98 mmol/L (ref 98–111)
Creatinine, Ser: 1 mg/dL (ref 0.61–1.24)
GFR, Estimated: >60 mL/min (ref >60)
Glucose, Bld: 272 mg/dL — ABNORMAL HIGH (ref 70–99)
Potassium: 3.8 mmol/L (ref 3.5–5.1)
Sodium: 134 mmol/L — ABNORMAL LOW (ref 135–145)

## 2022-12-28 LAB — LIPASE, BLOOD: Lipase: 62 U/L — ABNORMAL HIGH (ref 11–51)

## 2022-12-28 LAB — BAYER DCA HB A1C WAIVED: HB A1C (BAYER DCA - WAIVED): 8.3 % — ABNORMAL HIGH (ref 4.8–5.6)

## 2022-12-28 LAB — TROPONIN I (HIGH SENSITIVITY)
Troponin I (High Sensitivity): 6 ng/L (ref <18)
Troponin I (High Sensitivity): 5 ng/L (ref <18)

## 2022-12-28 MED ORDER — LANTUS SOLOSTAR 100 UNIT/ML ~~LOC~~ SOPN
26.0000 [IU] | PEN_INJECTOR | Freq: Every day | SUBCUTANEOUS | 5 refills | Status: DC
Start: 2022-12-28 — End: 2023-03-31

## 2022-12-28 NOTE — Patient Instructions (Addendum)
I've referred you to the following specialists: Cardiology (for the chest pain) Podiatry (for your nails) Ophthalmology (for your vision)  We will set up your diabetic retinal eye exam here.  You will be called for an appointment time in the next week or so.  Sugar is still NOT controlled.  A1c 8.3 today.  Your goal is less than 7.0.  Go up on the insulin to 26 units daily.  If you start having chest pain again, PLEASE GO TO ER/ CALL 911.  There is no way we can rule out heart attack without getting you checked out.

## 2022-12-28 NOTE — ED Notes (Signed)
Have patient call his mother if he needs a ride home

## 2022-12-28 NOTE — ED Triage Notes (Signed)
Pt here with chest pain intermittently for one week.  Pt saw doctor today and they did an EKG on him on the office, said it was abnormal and sent him here to the ED.

## 2022-12-28 NOTE — ED Provider Triage Note (Signed)
Emergency Medicine Provider Triage Evaluation Note  Steve Romero , a 52 y.o. male  was evaluated in triage.  Pt complains of chest pain for the past 10 days intermittently.  Reports it radiates into his left arm with some tingling as well.  No shortness of breath.  No cough or cold symptoms.  No fever.  Denies any trauma to the area.  He was sent over by his PCP today for EKG changes.  His physician called me earlier and reported that he had EKG changes in lead II, III, aVF, V2 and V3.  Review of Systems  Positive:  Negative:   Physical Exam  BP (!) 152/109 (BP Location: Right Arm)   Pulse 76   Temp 97.7 F (36.5 C) (Oral)   Resp 18   SpO2 98%  Gen:   Awake, no distress   Resp:  Normal effort  MSK:   Moves extremities without difficulty  Other:    Medical Decision Making  Medically screening exam initiated at 5:53 PM.  Appropriate orders placed.  Steve Romero was informed that the remainder of the evaluation will be completed by another provider, this initial triage assessment does not replace that evaluation, and the importance of remaining in the ED until their evaluation is complete.  Chest pain work up . EKG shows NSR here   Steve Romero, [place] 12/28/22 1754

## 2022-12-28 NOTE — Progress Notes (Signed)
Subjective: CC:DM PCP: Raliegh Ip, DO HYQ:MVHQIO Steve Romero is a 52 y.o. male presenting to clinic today for:  1. Type 2 Diabetes with hypertension, hyperlipidemia:  Currently utilizing finger stick sugars.  Sugars can go as high as the 300s but typically around the 120s.  No hypoglycemic episodes.  Currently injecting 24 units of Lantus daily.  He is starting the freestyle libre soon and has an appointment to talk about how to apply this properly.  Last eye exam: needs Last foot exam: needs Last A1c:  Lab Results  Component Value Date   HGBA1C 7.7 (H) 09/25/2022   Nephropathy screen indicated?: UTD Last flu, zoster and/or pneumovax:  Immunization History  Administered Date(s) Administered   HIB (PRP-T) 11/22/2022   Influenza, Quadrivalent, Recombinant, Inj, Pf 03/15/2017   Influenza,inj,Quad PF,6+ Mos 03/15/2017   Meningococcal B, OMV 11/22/2022   Meningococcal Mcv4o 11/22/2022   PNEUMOCOCCAL CONJUGATE-20 11/22/2022   Pneumococcal Polysaccharide-23 09/19/2018   Tdap 01/05/2020    ROS: Reports some chest pain that is left-sided today.  This is been ongoing for the last several days. He is getting some numbness and tingling in the left arm associated with pain.  He does feel short of breath with CP.  He smokes.    ROS: Per HPI  Allergies  Allergen Reactions   Desipramine Nausea Only and Rash   Past Medical History:  Diagnosis Date   Acute pancreatitis without necrosis or infection, unspecified    AKI (acute kidney injury) (HCC) 05/17/2022   Anxiety 10/2014   Ascites    Bipolar disorder (HCC) age 72   COPD (chronic obstructive pulmonary disease) (HCC) 2016   Depression age 39   Enlarged prostate    Hyperlipidemia 2016   Hypertension 2013   Nausea vomiting and diarrhea 06/15/2022   Schizoaffective disorder (HCC) 11/13/2014   Sleep apnea    Does not wear c-pap, sleeps in sitting up position per pt   Thyroid disease 11/2014    Current Outpatient  Medications:    acetaminophen (TYLENOL) 500 MG tablet, Take 2 tablets (1,000 mg total) by mouth every 8 (eight) hours as needed for mild pain., Disp: 30 tablet, Rfl: 0   albuterol (VENTOLIN HFA) 108 (90 Base) MCG/ACT inhaler, Inhale 2 puffs into the lungs every 6 (six) hours as needed for wheezing or shortness of breath., Disp: 8 g, Rfl: 1   Continuous Blood Gluc Receiver (FREESTYLE LIBRE READER) DEVI, 1 Units by Does not apply route daily. UAD to test BGs daily. Dx E11.9, Disp: 1 each, Rfl: 1   Continuous Blood Gluc Sensor (FREESTYLE LIBRE 3 SENSOR) MISC, Place 1 sensor on the skin every 14 days. Use to check glucose continuously e11.9, Disp: 6 each, Rfl: 3   cyanocobalamin (VITAMIN B12) 1000 MCG tablet, Take 1 tablet (1,000 mcg total) by mouth daily., Disp: 60 tablet, Rfl: 0   docusate sodium (COLACE) 100 MG capsule, Take 1 capsule (100 mg total) by mouth 2 (two) times daily., Disp: 10 capsule, Rfl: 0   feeding supplement, GLUCERNA SHAKE, (GLUCERNA SHAKE) LIQD, Take 237 mLs by mouth 2 (two) times daily between meals., Disp: 10000 mL, Rfl: 0   ferrous sulfate 325 (65 FE) MG EC tablet, Take 1 tablet (325 mg total) by mouth daily with breakfast., Disp: 60 tablet, Rfl: 0   folic acid (FOLVITE) 1 MG tablet, Take 1 tablet (1 mg total) by mouth daily., Disp: 60 tablet, Rfl: 0   insulin glargine (LANTUS SOLOSTAR) 100 UNIT/ML Solostar Pen, Inject 24 Units  into the skin daily. TO REPLACE TOUJEO, Disp: 15 mL, Rfl: 5   Insulin Pen Needle (BD PEN NEEDLE NANO U/F) 32G X 4 MM MISC, UAD to administer insulin, Disp: 100 each, Rfl: 3   lipase/protease/amylase (CREON) 36000 UNITS CPEP capsule, Take 2 capsules (72,000 Units total) by mouth 3 (three) times daily with meals. (Patient taking differently: Take 72,000-108,000 Units by mouth in the morning and at bedtime.), Disp: 270 capsule, Rfl: 1   methocarbamol 1000 MG TABS, Take 1,000 mg by mouth every 8 (eight) hours as needed for muscle spasms., Disp: 20 tablet, Rfl:  0   mometasone-formoterol (DULERA) 200-5 MCG/ACT AERO, Inhale 2 puffs into the lungs 2 (two) times daily., Disp: 1 each, Rfl: 1   ondansetron (ZOFRAN-ODT) 4 MG disintegrating tablet, Take 1 tablet (4 mg total) by mouth every 8 (eight) hours as needed for nausea or vomiting., Disp: 60 tablet, Rfl: 1   polyethylene glycol (MIRALAX / GLYCOLAX) 17 g packet, Take 17 g by mouth daily as needed for mild constipation., Disp: 14 each, Rfl: 0   umeclidinium bromide (INCRUSE ELLIPTA) 62.5 MCG/ACT AEPB, Inhale 1 puff into the lungs daily., Disp: 30 each, Rfl: 0 Social History   Socioeconomic History   Marital status: Single    Spouse name: Not on file   Number of children: 0   Years of education: Not on file   Highest education level: Not on file  Occupational History   Not on file  Tobacco Use   Smoking status: Every Day    Packs/day: 0.25    Years: 26.00    Additional pack years: 0.00    Total pack years: 6.50    Types: Cigarettes   Smokeless tobacco: Never   Tobacco comments:    4-5 cig daily as of 10/07/2020.  Vaping Use   Vaping Use: Never used  Substance and Sexual Activity   Alcohol use: No   Drug use: No   Sexual activity: Never    Birth control/protection: None  Other Topics Concern   Not on file  Social History Narrative   Not on file   Social Determinants of Health   Financial Resource Strain: Not on file  Food Insecurity: No Food Insecurity (11/16/2022)   Hunger Vital Sign    Worried About Running Out of Food in the Last Year: Never true    Ran Out of Food in the Last Year: Never true  Transportation Needs: No Transportation Needs (11/16/2022)   PRAPARE - Administrator, Civil Service (Medical): No    Lack of Transportation (Non-Medical): No  Physical Activity: Not on file  Stress: Not on file  Social Connections: Not on file  Intimate Partner Violence: Not At Risk (11/16/2022)   Humiliation, Afraid, Rape, and Kick questionnaire    Fear of Current or  Ex-Partner: No    Emotionally Abused: No    Physically Abused: No    Sexually Abused: No   Family History  Problem Relation Age of Onset   Diabetes Father    Heart disease Father    Heart disease Maternal Grandmother    Stroke Maternal Grandfather    Colon cancer Neg Hx    Esophageal cancer Neg Hx    Inflammatory bowel disease Neg Hx    Liver disease Neg Hx    Pancreatic cancer Neg Hx    Rectal cancer Neg Hx    Stomach cancer Neg Hx     Objective: Office vital signs reviewed. BP 137/87  Pulse 76   Temp 97.7 F (36.5 C) (Oral)   Resp 20   Ht 5\' 10"  (1.778 m)   Wt 192 lb (87.1 kg)   SpO2 98%   BMI 27.55 kg/m   Physical Examination:  General: Awake, alert, nontoxic male, No acute distress HEENT: sclera white Cardio: regular rate and rhythm, S1S2 heard, no murmurs appreciated Pulm: Globally decreased breath sounds.  No wheezes, rhonchi or rales.  Normal work of breathing on room air Neuro: see DM foot Diabetic Foot Exam - Simple   Simple Foot Form Diabetic Foot exam was performed with the following findings: Yes 12/28/2022  1:25 PM  Visual Inspection See comments: Yes Sensation Testing Intact to touch and monofilament testing bilaterally: Yes Pulse Check Posterior Tibialis and Dorsalis pulse intact bilaterally: Yes Comments Onychomycotic changes to the nails, which are long      Assessment/ Plan: 52 y.o. male   Diabetes mellitus treated with insulin (HCC) - Plan: Bayer DCA Hb A1c Waived, Ambulatory referral to Podiatry, Ambulatory referral to Ophthalmology, insulin glargine (LANTUS SOLOSTAR) 100 UNIT/ML Solostar Pen  Hypertension associated with diabetes (HCC) - Plan: Ambulatory referral to Cardiology  Chest pain, unspecified type - Plan: EKG 12-Lead, DG Chest 2 View, Ambulatory referral to Cardiology  Tobacco use - Plan: DG Chest 2 View  Change in vision - Plan: Ambulatory referral to Ophthalmology  Onychomycosis - Plan: Ambulatory referral to  Podiatry  Diabetes remains uncontrolled.  A1c was over 8 today.  I would like him to advance his insulin to 26 units daily.  Keep appointment with CCM for libre.  May need to consider advancing further pending libre readings versus mealtime dosing.  I have referred him to cardiology for chest pain.  EKG obtained today demonstrated changes in several leads (II, III, AVF, V2, V3). Given his very typical signs of ACS I have referred him emergently to the ER.  He is hemodynamically stable and going to go by private vehicle, his sister will drive him down to [facility] for further evaluation and management.  I personally called the ER to sign out the patient, triage nurse.  She is aware of impending arrival.  Counseled on smoking cessation  Referral to ophthalmology for changes in vision though I suspect some of this has to do with uncontrolled blood sugars.  Will get diabetic retinal exam done here  Referral to podiatry for management of nails.  No orders of the defined types were placed in this encounter.  No orders of the defined types were placed in this encounter.    Raliegh Ip, DO [facility] [phone]

## 2022-12-29 ENCOUNTER — Other Ambulatory Visit: Payer: Self-pay

## 2022-12-29 ENCOUNTER — Encounter (HOSPITAL_COMMUNITY): Payer: Self-pay | Admitting: Internal Medicine

## 2022-12-29 ENCOUNTER — Observation Stay (HOSPITAL_COMMUNITY): Payer: Medicaid Other

## 2022-12-29 DIAGNOSIS — R079 Chest pain, unspecified: Secondary | ICD-10-CM | POA: Diagnosis not present

## 2022-12-29 DIAGNOSIS — K8689 Other specified diseases of pancreas: Secondary | ICD-10-CM

## 2022-12-29 DIAGNOSIS — F17210 Nicotine dependence, cigarettes, uncomplicated: Secondary | ICD-10-CM | POA: Diagnosis not present

## 2022-12-29 DIAGNOSIS — I10 Essential (primary) hypertension: Secondary | ICD-10-CM

## 2022-12-29 DIAGNOSIS — Z8709 Personal history of other diseases of the respiratory system: Secondary | ICD-10-CM

## 2022-12-29 LAB — HEMOGLOBIN A1C
Hgb A1c MFr Bld: 8.3 % — ABNORMAL HIGH (ref 4.8–5.6)
Mean Plasma Glucose: 191.51 mg/dL

## 2022-12-29 LAB — TROPONIN I (HIGH SENSITIVITY)
Troponin I (High Sensitivity): 8 ng/L (ref <18)
Troponin I (High Sensitivity): 6 ng/L (ref <18)

## 2022-12-29 LAB — CBG MONITORING, ED
Glucose-Capillary: 184 mg/dL — ABNORMAL HIGH (ref 70–99)
Glucose-Capillary: 178 mg/dL — ABNORMAL HIGH (ref 70–99)

## 2022-12-29 LAB — LIPID PANEL
Cholesterol: 169 mg/dL (ref 0–200)
HDL: 25 mg/dL — ABNORMAL LOW (ref >40)
LDL Cholesterol: 124 mg/dL — ABNORMAL HIGH (ref 0–99)
Total CHOL/HDL Ratio: 6.8 RATIO
Triglycerides: 98 mg/dL (ref <150)
VLDL: 20 mg/dL (ref 0–40)

## 2022-12-29 MED ORDER — ONDANSETRON HCL 4 MG PO TABS: 4.0000 mg | ORAL_TABLET | Freq: Four times a day (QID) | ORAL | Status: DC | PRN

## 2022-12-29 MED ORDER — ALBUTEROL SULFATE (2.5 MG/3ML) 0.083% IN NEBU: 3.0000 mL | INHALATION_SOLUTION | Freq: Four times a day (QID) | RESPIRATORY_TRACT | Status: DC | PRN

## 2022-12-29 MED ORDER — MORPHINE SULFATE (PF) 2 MG/ML IV SOLN
2.0000 mg | Freq: Once | INTRAVENOUS | Status: AC
  Administered 2022-12-29: 2 mg via INTRAVENOUS
  Filled 2022-12-29: qty 1

## 2022-12-29 MED ORDER — SODIUM CHLORIDE 0.9% FLUSH
3.0000 mL | Freq: Two times a day (BID) | INTRAVENOUS | Status: DC
  Administered 2022-12-29 – 2022-12-30 (×3): 3 mL via INTRAVENOUS

## 2022-12-29 MED ORDER — AMLODIPINE BESYLATE 5 MG PO TABS
5.0000 mg | ORAL_TABLET | Freq: Every day | ORAL | Status: DC
  Administered 2022-12-29 – 2022-12-30 (×2): 5 mg via ORAL
  Filled 2022-12-29 (×2): qty 1

## 2022-12-29 MED ORDER — ASPIRIN 81 MG PO CHEW
324.0000 mg | CHEWABLE_TABLET | Freq: Once | ORAL | Status: AC
  Administered 2022-12-29: 324 mg via ORAL
  Filled 2022-12-29: qty 4

## 2022-12-29 MED ORDER — ASPIRIN 81 MG PO CHEW
81.0000 mg | CHEWABLE_TABLET | Freq: Every day | ORAL | Status: DC
  Administered 2022-12-29 – 2022-12-30 (×2): 81 mg via ORAL
  Filled 2022-12-29 (×2): qty 1

## 2022-12-29 MED ORDER — MOMETASONE FURO-FORMOTEROL FUM 200-5 MCG/ACT IN AERO
2.0000 | INHALATION_SPRAY | Freq: Two times a day (BID) | RESPIRATORY_TRACT | Status: DC
  Administered 2022-12-29 – 2022-12-30 (×2): 2 via RESPIRATORY_TRACT
  Filled 2022-12-29: qty 8.8

## 2022-12-29 MED ORDER — HYDRALAZINE HCL 20 MG/ML IJ SOLN: 10.0000 mg | Freq: Four times a day (QID) | INTRAMUSCULAR | Status: DC | PRN

## 2022-12-29 MED ORDER — NITROGLYCERIN 0.4 MG SL SUBL
0.4000 mg | SUBLINGUAL_TABLET | SUBLINGUAL | Status: DC | PRN
  Administered 2022-12-29 (×2): 0.4 mg via SUBLINGUAL
  Filled 2022-12-29 (×2): qty 1

## 2022-12-29 MED ORDER — ONDANSETRON HCL 4 MG/2ML IJ SOLN: 4.0000 mg | Freq: Four times a day (QID) | INTRAMUSCULAR | Status: DC | PRN

## 2022-12-29 MED ORDER — SODIUM CHLORIDE 0.9% FLUSH: 3.0000 mL | INTRAVENOUS | Status: DC | PRN

## 2022-12-29 MED ORDER — ACETAMINOPHEN 325 MG PO TABS
650.0000 mg | ORAL_TABLET | Freq: Four times a day (QID) | ORAL | Status: DC | PRN
  Administered 2022-12-29: 650 mg via ORAL
  Filled 2022-12-29: qty 2

## 2022-12-29 MED ORDER — UMECLIDINIUM BROMIDE 62.5 MCG/ACT IN AEPB
1.0000 | INHALATION_SPRAY | Freq: Every day | RESPIRATORY_TRACT | Status: DC
  Filled 2022-12-29: qty 7

## 2022-12-29 MED ORDER — SODIUM CHLORIDE 0.9 % IV SOLN: 250.0000 mL | INTRAVENOUS | Status: DC | PRN

## 2022-12-29 MED ORDER — ACETAMINOPHEN 650 MG RE SUPP: 650.0000 mg | Freq: Four times a day (QID) | RECTAL | Status: DC | PRN

## 2022-12-29 MED ORDER — ENOXAPARIN SODIUM 40 MG/0.4ML IJ SOSY
40.0000 mg | PREFILLED_SYRINGE | INTRAMUSCULAR | Status: DC
  Administered 2022-12-29: 40 mg via SUBCUTANEOUS
  Filled 2022-12-29: qty 0.4

## 2022-12-29 NOTE — ED Provider Notes (Signed)
[place] EMERGENCY DEPARTMENT AT [facility] Provider Note   CSN: [id number] Arrival date & time: 12/28/22  1611     History  Chief Complaint  Patient presents with   Chest Pain    Steve Romero is a 52 y.o. male.  HPI   Patient with medical history including diabetes, hypertension, tobacco use, presenting with complaints of chest pain, going on for the last week, states pain is intermittent but has now become more constant, states is worsened with exertion, will feel in his left chest, does not radiate, will become short of breath and slightly diaphoretic.  He states that upon laying down and resting the pain will eventually go away.  He has had no near syncope no associated nausea or vomiting, denies any pleuritic chest pain, no history of PEs or DVTs no recent surgeries or long immobilization.  Currently smokes half pack daily, denies any illicit drug use, no associated family history of cardiac abnormalities, patient states that he was seen at his primary care doctor and they are concerned so he was sent here for further evaluation he is not yet seen by a cardiologist.  Home Medications Prior to Admission medications   Medication Sig Start Date End Date Taking? Authorizing Provider  albuterol (VENTOLIN HFA) 108 (90 Base) MCG/ACT inhaler Inhale 2 puffs into the lungs every 6 (six) hours as needed for wheezing or shortness of breath. 11/23/22  Yes Rolly Salter, MD  docusate sodium (COLACE) 100 MG capsule Take 1 capsule (100 mg total) by mouth 2 (two) times daily. Patient taking differently: Take 100 mg by mouth daily as needed for mild constipation. 11/23/22  Yes Fritzi Mandes, MD  insulin glargine (LANTUS SOLOSTAR) 100 UNIT/ML Solostar Pen Inject 26 Units into the skin daily. TO REPLACE TOUJEO Patient taking differently: Inject 24 Units into the skin at bedtime. TO REPLACE TOUJEO 12/28/22  Yes Delynn Flavin M, DO  lipase/protease/amylase (CREON) 36000 UNITS CPEP  capsule Take 2 capsules (72,000 Units total) by mouth 3 (three) times daily with meals. Patient taking differently: Take 72,000 Units by mouth 2 (two) times daily with a meal. Take 1 capsule with snacks 05/24/22  Yes Shalhoub, Deno Lunger, MD  mometasone-formoterol (DULERA) 200-5 MCG/ACT AERO Inhale 2 puffs into the lungs 2 (two) times daily. 11/23/22  Yes Rolly Salter, MD  polyethylene glycol (MIRALAX / GLYCOLAX) 17 g packet Take 17 g by mouth daily as needed for mild constipation. 11/23/22  Yes Fritzi Mandes, MD  umeclidinium bromide (INCRUSE ELLIPTA) 62.5 MCG/ACT AEPB Inhale 1 puff into the lungs daily. 11/23/22  Yes Rolly Salter, MD  acetaminophen (TYLENOL) 500 MG tablet Take 2 tablets (1,000 mg total) by mouth every 8 (eight) hours as needed for mild pain. Patient not taking: Reported on 12/29/2022 11/23/22   Fritzi Mandes, MD  Continuous Blood Gluc Receiver (FREESTYLE LIBRE READER) DEVI 1 Units by Does not apply route daily. UAD to test BGs daily. Dx E11.9 09/25/22   Raliegh Ip, DO  Continuous Blood Gluc Sensor (FREESTYLE LIBRE 3 SENSOR) MISC Place 1 sensor on the skin every 14 days. Use to check glucose continuously e11.9 09/25/22   Delynn Flavin M, DO  feeding supplement, GLUCERNA SHAKE, (GLUCERNA SHAKE) LIQD Take 237 mLs by mouth 2 (two) times daily between meals. Patient taking differently: Take 237 mLs by mouth daily. 11/23/22   Rolly Salter, MD  Insulin Pen Needle (BD PEN NEEDLE NANO U/F) 32G X 4 MM MISC UAD to  administer insulin 09/25/22   Delynn Flavin M, DO      Allergies    Desipramine    Review of Systems   Review of Systems  Constitutional:  Negative for chills and fever.  Respiratory:  Negative for shortness of breath.   Cardiovascular:  Positive for chest pain.  Gastrointestinal:  Negative for abdominal pain.  Neurological:  Negative for headaches.    Physical Exam Updated Vital Signs BP (!) 139/93   Pulse 74   Temp 97.6 F (36.4 C) (Oral)   Resp  20   SpO2 96%  Physical Exam Vitals and nursing note reviewed.  Constitutional:      General: He is not in acute distress.    Appearance: He is not ill-appearing.  HENT:     Head: Normocephalic and atraumatic.     Nose: No congestion.  Eyes:     Conjunctiva/sclera: Conjunctivae normal.  Cardiovascular:     Rate and Rhythm: Normal rate and regular rhythm.     Pulses: Normal pulses.     Heart sounds: No murmur heard.    No friction rub. No gallop.  Pulmonary:     Effort: No respiratory distress.     Breath sounds: No wheezing, rhonchi or rales.  Musculoskeletal:     Right lower leg: No edema.     Left lower leg: No edema.     Comments: No unilateral leg swelling no calf tenderness no palpable cords.  Skin:    General: Skin is warm and dry.  Neurological:     Mental Status: He is alert.  Psychiatric:        Mood and Affect: Mood normal.     ED Results / Procedures / Treatments   Labs (all labs ordered are listed, but only abnormal results are displayed) Labs Reviewed  BASIC METABOLIC PANEL - Abnormal; Notable for the following components:      Result Value   Sodium 134 (*)    Glucose, Bld 272 (*)    All other components within normal limits  CBC - Abnormal; Notable for the following components:   WBC 13.9 (*)    Platelets 695 (*)    All other components within normal limits  LIPASE, BLOOD - Abnormal; Notable for the following components:   Lipase 62 (*)    All other components within normal limits  CBG MONITORING, ED - Abnormal; Notable for the following components:   Glucose-Capillary 184 (*)    All other components within normal limits  CBG MONITORING, ED - Abnormal; Notable for the following components:   Glucose-Capillary 178 (*)    All other components within normal limits  RAPID URINE DRUG SCREEN, HOSP PERFORMED  LIPID PANEL  HEMOGLOBIN A1C  TROPONIN I (HIGH SENSITIVITY)  TROPONIN I (HIGH SENSITIVITY)    EKG None  Radiology DG Chest 2  View  Result Date: 12/28/2022 CLINICAL DATA:  Chest pain. EXAM: CHEST - 2 VIEW COMPARISON:  Chest radiograph dated 11/13/2022. FINDINGS: Left lung base atelectasis/scarring. No focal consolidation, pleural effusion, or pneumothorax. The cardiac silhouette is within normal limits. No acute osseous pathology. IMPRESSION: No active cardiopulmonary disease. Electronically Signed   By: Elgie Collard M.D.   On: 12/28/2022 17:20    Procedures Procedures    Medications Ordered in ED Medications  nitroGLYCERIN (NITROSTAT) SL tablet 0.4 mg (0.4 mg Sublingual Given 12/29/22 0429)  albuterol (PROVENTIL) (2.5 MG/3ML) 0.083% nebulizer solution 3 mL (has no administration in time range)  mometasone-formoterol (DULERA) 200-5 MCG/ACT inhaler 2 puff (  has no administration in time range)  umeclidinium bromide (INCRUSE ELLIPTA) 62.5 MCG/ACT 1 puff (has no administration in time range)  enoxaparin (LOVENOX) injection 40 mg (has no administration in time range)  sodium chloride flush (NS) 0.9 % injection 3 mL (has no administration in time range)  sodium chloride flush (NS) 0.9 % injection 3 mL (has no administration in time range)  0.9 %  sodium chloride infusion (has no administration in time range)  acetaminophen (TYLENOL) tablet 650 mg (has no administration in time range)    Or  acetaminophen (TYLENOL) suppository 650 mg (has no administration in time range)  ondansetron (ZOFRAN) tablet 4 mg (has no administration in time range)    Or  ondansetron (ZOFRAN) injection 4 mg (has no administration in time range)  hydrALAZINE (APRESOLINE) injection 10 mg (has no administration in time range)  aspirin chewable tablet 81 mg (has no administration in time range)  amLODipine (NORVASC) tablet 5 mg (has no administration in time range)  aspirin chewable tablet 324 mg (324 mg Oral Given 12/29/22 0405)  morphine (PF) 2 MG/ML injection 2 mg (2 mg Intravenous Given 12/29/22 0409)    ED Course/ Medical Decision  Making/ A&P                             Medical Decision Making Amount and/or Complexity of Data Reviewed Labs: ordered. Radiology: ordered.  Risk OTC drugs. Prescription drug management. Decision regarding hospitalization.   This patient presents to the ED for concern of chest pain, this involves an extensive number of treatment options, and is a complaint that carries with it a high risk of complications and morbidity.  The differential diagnosis includes unstable angina, ACS, PE, dissection, pneumonia    Additional history obtained:  Additional history obtained from N/A External records from outside source obtained and reviewed including primary care notes   Co morbidities that complicate the patient evaluation  Hypertension, diabetes,  Social Determinants of Health:  Current smoker    Lab Tests:  I Ordered, and personally interpreted labs.  The pertinent results include: CBC shows leukocytosis of 13.8, BNP reveals sodium 134, glucose 272, negative delta troponin, lipase 62,   Imaging Studies ordered:  I ordered imaging studies including chest x-ray I independently visualized and interpreted imaging which unremarkable I agree with the radiologist interpretation   Cardiac Monitoring:  The patient was maintained on a cardiac monitor.  I personally viewed and interpreted the cardiac monitored which showed an underlying rhythm of: Without signs of ischemia   Medicines ordered and prescription drug management:  I ordered medication including aspirin, morphine, nitroglycerin I have reviewed the patients home medicines and have made adjustments as needed  Critical Interventions:  N/A   Reevaluation:  Presents with chest pain, triage obtain basic lab workup and imaging which I personally reviewed, shows leukocytosis with elevated platelets, presentation is concerning for unstable angina, patient has a heart score 4, will provide him with morphine, aspirin,  nitro and reassess  Patient was reassessed, states pain is now down to a 2 out of 10, due to his heart score and continued pain I do recommend admission for cardiac rule outs patient is agreement this plan will admit to medicine.  Consultations Obtained:  I requested consultation with Dr. Janalyn Shy,  and discussed lab and imaging findings as well as pertinent plan - they recommend: She will admit the patient.    Test Considered:  CTA chest-defers my suspicion  for PE is very low at this time denies pleuritic chest pain, shortness of breath, vital signs reassuring nontachypneic nonhypoxic, patient has low risk factors.    Rule out I have low suspicion for Stemi/NstemI, EKG was sinus rhythm without signs of ischemia, patient had negative delta troponin.  Low suspicion for AAA or aortic dissection as history is atypical, patient has low risk factors.  Low suspicion for systemic infection as patient is nontoxic-appearing, vital signs reassuring, no obvious source infection noted on exam.     Dispostion and problem list  After consideration of the diagnostic results and the patients response to treatment, I feel that the patent would benefit from admission.  Precordial chest pain-has a heart score 4, with continued pain, patient will need chest pain rule out workup, and may benefit from formal cardiology consultation.            Final Clinical Impression(s) / ED Diagnoses Final diagnoses:  Precordial chest pain    Rx / DC Orders ED Discharge Orders     None         Carroll Sage, PA-C 12/29/22 4401    Nira Conn, MD 12/30/22 [phone]

## 2022-12-29 NOTE — Progress Notes (Signed)
  PROGRESS NOTE    Steve Romero  VQQ:[id number] DOB: 1971/04/30 DOA: 12/28/2022 PCP: Raliegh Ip, DO    Day 0 note, for complete HPI and information see progress note earlier this morning   Briefly patient is a 52 year old male medical history as outlined in the admission documentation with chest pain and having been evaluated by primary care with abnormal EKG referred to the ED for further evaluation and workup. Hospitalist called for admission, cardiology called in consult.  Patient admitted as above given symptoms, reported abnormal EKG and significant cardiac risk factor and HEART score 4. Currently awaiting nuclear stress test, pending results patient will likely discharge home versus require further intervention pending cardiology evaluation.   Azucena Fallen, DO [facility]  If 7PM-7AM, please contact night-coverage [email or web address]  12/29/2022, 8:16 AM

## 2022-12-29 NOTE — ED Notes (Signed)
ED TO INPATIENT HANDOFF REPORT  ED Nurse Name and Phone #:   S Name/Age/Gender Steve Romero 52 y.o. male Room/Bed: 044C/044C  Code Status   Code Status: Full Code  Home/SNF/Other Home Patient oriented to: self, place, time, and situation Is this baseline? Yes   Triage Complete: Triage complete  Chief Complaint Chest pain [R07.9]  Triage Note Pt here with chest pain intermittently for one week.  Pt saw doctor today and they did an EKG on him on the office, said it was abnormal and sent him here to the ED.    Allergies Allergies  Allergen Reactions   Desipramine Nausea Only and Rash    Level of Care/Admitting Diagnosis ED Disposition     ED Disposition  Admit   Condition  --   Comment  Hospital Area: [facility] [[id number]]  Level of Care: Telemetry Medical [104]  May place patient in observation at [facility] or [facility] if equivalent level of care is available:: No  Covid Evaluation: Asymptomatic - no recent exposure (last 10 days) testing not required  Diagnosis: Chest pain [235573]  Admitting Physician: Tereasa Coop [[id number]]  Attending Physician: Tereasa Coop [[id number]]          B Medical/Surgery History Past Medical History:  Diagnosis Date   Acute pancreatitis without necrosis or infection, unspecified    AKI (acute kidney injury) (HCC) 05/17/2022   Anxiety 10/2014   Ascites    Bipolar disorder (HCC) age 76   COPD (chronic obstructive pulmonary disease) (HCC) 2016   Depression age 33   Enlarged prostate    Hyperlipidemia 2016   Hypertension 2013   Nausea vomiting and diarrhea 06/15/2022   Schizoaffective disorder (HCC) 11/13/2014   Sleep apnea    Does not wear c-pap, sleeps in sitting up position per pt   Thyroid disease 11/2014   Past Surgical History:  Procedure Laterality Date   BALLOON DILATION N/A 11/07/2020   Procedure: BALLOON DILATION;  Surgeon: Lemar Lofty., MD;  Location: [facility] ENDOSCOPY;   Service: Gastroenterology;  Laterality: N/A;   BALLOON DILATION N/A 06/26/2021   Procedure: BALLOON DILATION;  Surgeon: Meridee Score Netty Starring., MD;  Location: [facility] ENDOSCOPY;  Service: Gastroenterology;  Laterality: N/A;   BILIARY DILATION  07/24/2021   Procedure: BILIARY DILATION;  Surgeon: Meridee Score Netty Starring., MD;  Location: [facility] ENDOSCOPY;  Service: Gastroenterology;;   BILIARY DILATION  05/22/2022   Procedure: BILIARY DILATION;  Surgeon: Lemar Lofty., MD;  Location: [facility] ENDOSCOPY;  Service: Gastroenterology;;   BILIARY DILATION  06/16/2022   Procedure: GASTROSTOMY DILATION;  Surgeon: Lemar Lofty., MD;  Location: [facility] ENDOSCOPY;  Service: Gastroenterology;;   BILIARY STENT PLACEMENT N/A 10/10/2020   Procedure: BILIARY STENT PLACEMENT;  Surgeon: Malissa Hippo, MD;  Location: [facility];  Service: Endoscopy;  Laterality: N/A;   BILIARY STENT PLACEMENT  11/09/2020   Procedure: BILIARY STENT PLACEMENT;  Surgeon: Meridee Score Netty Starring., MD;  Location: [facility] ENDOSCOPY;  Service: Gastroenterology;;   BILIARY STENT PLACEMENT  11/07/2020   Procedure: BILIARY STENT PLACEMENT;  Surgeon: Lemar Lofty., MD;  Location: [facility] ENDOSCOPY;  Service: Gastroenterology;;   BILIARY STENT PLACEMENT  11/11/2020   Procedure: BILIARY STENT PLACEMENT;  Surgeon: Lemar Lofty., MD;  Location: [facility] ENDOSCOPY;  Service: Gastroenterology;;   BILIARY STENT PLACEMENT  11/14/2020   Procedure: BILIARY STENT PLACEMENT;  Surgeon: Lemar Lofty., MD;  Location: [facility] ENDOSCOPY;  Service: Gastroenterology;;   BILIARY STENT PLACEMENT N/A 01/29/2021   Procedure: BILIARY STENT PLACEMENT;  Surgeon: Lemar Lofty., MD;  Location: [facility] ENDOSCOPY;  Service: Gastroenterology;  Laterality: N/A;   BILIARY STENT PLACEMENT  06/26/2021   Procedure: BILIARY STENT PLACEMENT;  Surgeon: Meridee Score Netty Starring., MD;  Location: [facility] ENDOSCOPY;  Service: Gastroenterology;;   BILIARY STENT PLACEMENT   07/24/2021   Procedure: BILIARY STENT PLACEMENT;  Surgeon: Lemar Lofty., MD;  Location: [facility] ENDOSCOPY;  Service: Gastroenterology;;   BIOPSY  11/07/2020   Procedure: BIOPSY;  Surgeon: Lemar Lofty., MD;  Location: [facility] ENDOSCOPY;  Service: Gastroenterology;;   BIOPSY  12/20/2020   Procedure: BIOPSY;  Surgeon: Lemar Lofty., MD;  Location: [facility] ENDOSCOPY;  Service: Gastroenterology;;   BIOPSY  06/26/2021   Procedure: BIOPSY;  Surgeon: Lemar Lofty., MD;  Location: [facility] ENDOSCOPY;  Service: Gastroenterology;;   CARPAL TUNNEL RELEASE Left 02/21/2016   Procedure: CARPAL TUNNEL RELEASE;  Surgeon: Vickki Hearing, MD;  Location: [facility];  Service: Orthopedics;  Laterality: Left;   CHOLECYSTECTOMY N/A 10/09/2020   Procedure: LAPAROSCOPIC CHOLECYSTECTOMY;  Surgeon: Lucretia Roers, MD;  Location: [facility];  Service: General;  Laterality: N/A;   CYST ENTEROSTOMY N/A 11/07/2020   Procedure: CYST ENTEROSTOMY;  Surgeon: Lemar Lofty., MD;  Location: [facility] ENDOSCOPY;  Service: Gastroenterology;  Laterality: N/A;   CYST GASTROSTOMY  11/11/2020   Procedure: CYST NECROSECTOMY;  Surgeon: Meridee Score Netty Starring., MD;  Location: [facility] ENDOSCOPY;  Service: Gastroenterology;;   CYST GASTROSTOMY  11/14/2020   Procedure: CYST NECROSECTOMY;  Surgeon: Lemar Lofty., MD;  Location: [facility] ENDOSCOPY;  Service: Gastroenterology;;   CYST GASTROSTOMY  06/16/2022   Procedure: CYST GASTROSTOMY;  Surgeon: Lemar Lofty., MD;  Location: [facility] ENDOSCOPY;  Service: Gastroenterology;;   CYST REMOVAL HAND     CYSTOSCOPY  06/26/2021   Procedure: CYSTOGASTROSTOMY;  Surgeon: Mansouraty, Netty Starring., MD;  Location: [facility] ENDOSCOPY;  Service: Gastroenterology;;   ENDOSCOPIC RETROGRADE CHOLANGIOPANCREATOGRAPHY (ERCP) WITH PROPOFOL N/A 11/09/2020   Procedure: ENDOSCOPIC RETROGRADE CHOLANGIOPANCREATOGRAPHY (ERCP) WITH PROPOFOL;  Surgeon: Lemar Lofty., MD;  Location: [facility]  ENDOSCOPY;  Service: Gastroenterology;  Laterality: N/A;   ENDOSCOPIC RETROGRADE CHOLANGIOPANCREATOGRAPHY (ERCP) WITH PROPOFOL N/A 12/20/2020   Procedure: ENDOSCOPIC RETROGRADE CHOLANGIOPANCREATOGRAPHY (ERCP) WITH PROPOFOL;  Surgeon: Meridee Score Netty Starring., MD;  Location: [facility] ENDOSCOPY;  Service: Gastroenterology;  Laterality: N/A;   ENDOSCOPIC RETROGRADE CHOLANGIOPANCREATOGRAPHY (ERCP) WITH PROPOFOL N/A 01/29/2021   Procedure: ENDOSCOPIC RETROGRADE CHOLANGIOPANCREATOGRAPHY (ERCP) WITH PROPOFOL;  Surgeon: Meridee Score Netty Starring., MD;  Location: [facility] ENDOSCOPY;  Service: Gastroenterology;  Laterality: N/A;   ENDOSCOPIC RETROGRADE CHOLANGIOPANCREATOGRAPHY (ERCP) WITH PROPOFOL N/A 07/24/2021   Procedure: ENDOSCOPIC RETROGRADE CHOLANGIOPANCREATOGRAPHY (ERCP) WITH PROPOFOL;  Surgeon: Meridee Score Netty Starring., MD;  Location: [facility] ENDOSCOPY;  Service: Gastroenterology;  Laterality: N/A;   ERCP N/A 10/10/2020   Procedure: ENDOSCOPIC RETROGRADE CHOLANGIOPANCREATOGRAPHY (ERCP);  Surgeon: Malissa Hippo, MD;  [facility];  Service: Endoscopy;  Laterality: N/A;   ERCP     ERCP N/A 05/22/2022   Procedure: ENDOSCOPIC RETROGRADE CHOLANGIOPANCREATOGRAPHY (ERCP);  Surgeon: Lemar Lofty., MD;  Location: [facility] ENDOSCOPY;  Service: Gastroenterology;  Laterality: N/A;   ESOPHAGOGASTRODUODENOSCOPY N/A 11/11/2020   Procedure: ESOPHAGOGASTRODUODENOSCOPY (EGD);  Surgeon: Lemar Lofty., MD;  Location: [facility] ENDOSCOPY;  Service: Gastroenterology;  Laterality: N/A;   ESOPHAGOGASTRODUODENOSCOPY N/A 11/14/2020   Procedure: ESOPHAGOGASTRODUODENOSCOPY (EGD);  Surgeon: Lemar Lofty., MD;  Location: [facility] ENDOSCOPY;  Service: Gastroenterology;  Laterality: N/A;   ESOPHAGOGASTRODUODENOSCOPY (EGD) WITH PROPOFOL N/A 11/09/2020   Procedure: ESOPHAGOGASTRODUODENOSCOPY (EGD) WITH PROPOFOL;  Surgeon: Meridee Score Netty Starring., MD;  Location: [facility] ENDOSCOPY;  Service: Gastroenterology;  Laterality: N/A;    ESOPHAGOGASTRODUODENOSCOPY (EGD) WITH PROPOFOL N/A 11/07/2020   Procedure: ESOPHAGOGASTRODUODENOSCOPY (EGD) WITH PROPOFOL;  Surgeon: Meridee Score Netty Starring., MD;  Location: [facility] ENDOSCOPY;  Service: Gastroenterology;  Laterality: N/A;   ESOPHAGOGASTRODUODENOSCOPY (EGD) WITH PROPOFOL N/A 12/20/2020   Procedure: ESOPHAGOGASTRODUODENOSCOPY (EGD) WITH PROPOFOL;  Surgeon: Meridee Score Netty Starring., MD;  Location: [facility] ENDOSCOPY;  Service: Gastroenterology;  Laterality: N/A;   ESOPHAGOGASTRODUODENOSCOPY (EGD) WITH PROPOFOL N/A 06/26/2021   Procedure: ESOPHAGOGASTRODUODENOSCOPY (EGD) WITH PROPOFOL;  Surgeon: Meridee Score Netty Starring., MD;  Location: [facility] ENDOSCOPY;  Service: Gastroenterology;  Laterality: N/A;   ESOPHAGOGASTRODUODENOSCOPY (EGD) WITH PROPOFOL N/A 07/24/2021   Procedure: ESOPHAGOGASTRODUODENOSCOPY (EGD) WITH PROPOFOL;  Surgeon: Meridee Score Netty Starring., MD;  Location: [facility] ENDOSCOPY;  Service: Gastroenterology;  Laterality: N/A;  AXIOS STENT   ESOPHAGOGASTRODUODENOSCOPY (EGD) WITH PROPOFOL N/A 05/22/2022   Procedure: ESOPHAGOGASTRODUODENOSCOPY (EGD) WITH PROPOFOL;  Surgeon: Meridee Score Netty Starring., MD;  Location[facility] ENDOSCOPY;  Service: Gastroenterology;  Laterality: N/A;   ESOPHAGOGASTRODUODENOSCOPY (EGD) WITH PROPOFOL N/A 06/16/2022   Procedure: ESOPHAGOGASTRODUODENOSCOPY (EGD) WITH PROPOFOL;  Surgeon: Meridee Score Netty Starring., MD;  Location: [facility] ENDOSCOPY;  Service: Gastroenterology;  Laterality: N/A;   ESOPHAGOGASTRODUODENOSCOPY (EGD) WITH PROPOFOL N/A 07/30/2022   Procedure: ESOPHAGOGASTRODUODENOSCOPY (EGD) WITH PROPOFOL;  Surgeon: Meridee Score Netty Starring., MD;  Location: [facility] ENDOSCOPY;  Service: Gastroenterology;  Laterality: N/A;   EUS  11/14/2020   Procedure: UPPER ENDOSCOPIC ULTRASOUND (EUS) LINEAR;  Surgeon: Lemar Lofty., MD;  Location: [facility] ENDOSCOPY;  Service: Gastroenterology;;   EUS N/A 06/26/2021   Procedure: UPPER ENDOSCOPIC ULTRASOUND (EUS) RADIAL;  Surgeon: Lemar Lofty.,  MD;  Location: [facility] ENDOSCOPY;  Service: Gastroenterology;  Laterality: N/A;   EUS N/A 05/22/2022   Procedure: UPPER ENDOSCOPIC ULTRASOUND (EUS) LINEAR;  Surgeon: Lemar Lofty., MD;  Location: [facility] ENDOSCOPY;  Service: Gastroenterology;  Laterality: N/A;   EUS N/A 06/16/2022   Procedure: UPPER ENDOSCOPIC ULTRASOUND (EUS) LINEAR;  Surgeon: Lemar Lofty., MD;  Location: [facility] ENDOSCOPY;  Service: Gastroenterology;  Laterality: N/A;   FINE NEEDLE ASPIRATION  05/22/2022   Procedure: FINE NEEDLE ASPIRATION;  Surgeon: Meridee Score Netty Starring., MD;  Location: [facility] ENDOSCOPY;  Service: Gastroenterology;;   GASTROINTESTINAL STENT REMOVAL  12/20/2020   Procedure: GASTROINTESTINAL STENT REMOVAL;  Surgeon: Lemar Lofty., MD;  Location: [facility] ENDOSCOPY;  Service: Gastroenterology;;  cyst gastrostomy stent and double pig tail stents x2 removed   HERNIA REPAIR Left 2002   groin   INGUINAL HERNIA REPAIR Left 04/05/2017   Procedure: RECURRENT HERNIA REPAIR INGUINAL ADULT WITH MESH;  Surgeon: Franky Macho, MD;  Location: [facility];  Service: General;  Laterality: Left;   MASS EXCISION Right 04/29/2020   Procedure: EXCISION MASS RIGHT WRIST;  Surgeon: Betha Loa, MD;  Location: [facility];  Service: Orthopedics;  Laterality: Right;   MASS EXCISION Right 10/09/2020   Procedure: EXCISION MASS, ABDOMINAL WALL, 2CM;  Surgeon: Lucretia Roers, MD;  Location: [facility];  Service: General;  Laterality: Right;   PANCREATIC STENT PLACEMENT  06/26/2021   Procedure: AXIOS STENT PLACEMENT;  Surgeon: Lemar Lofty., MD;  Location: [facility] ENDOSCOPY;  Service: Gastroenterology;;   PANCREATIC STENT PLACEMENT  06/16/2022   Procedure: PANCREATIC STENT PLACEMENT;  Surgeon: Lemar Lofty., MD;  Location: [facility] ENDOSCOPY;  Service: Gastroenterology;;   REMOVAL OF STONES  12/20/2020   Procedure: REMOVAL OF STONES;  Surgeon: Lemar Lofty., MD;  Location: [facility] ENDOSCOPY;  Service:  Gastroenterology;;   REMOVAL OF STONES  07/24/2021   Procedure: REMOVAL OF STONES;  Surgeon: Lemar Lofty., MD;  Location: [facility] ENDOSCOPY;  Service: Gastroenterology;;   REMOVAL OF STONES  05/22/2022   Procedure: REMOVAL OF STONES;  Surgeon: Lemar Lofty., MD;  Location: [facility] ENDOSCOPY;  Service: Gastroenterology;;   Dennison Mascot N/A 10/10/2020   Procedure: Dennison Mascot;  Surgeon: Malissa Hippo, MD;  Location: [facility];  Service: Endoscopy;  Laterality: N/A;   SPHINCTEROTOMY  07/24/2021   Procedure: SPHINCTEROTOMY;  Surgeon: Mansouraty, Netty Starring., MD;  Location: [facility] ENDOSCOPY;  Service: Gastroenterology;;   SPLENECTOMY, TOTAL N/A 11/17/2022   Procedure: SPLENECTOMY;  Surgeon: Fritzi Mandes, MD;  Location: [facility] OR;  Service: General;  Laterality: N/A;   SPYGLASS CHOLANGIOSCOPY N/A 12/20/2020   Procedure: ZOXWRUEA CHOLANGIOSCOPY;  Surgeon: Lemar Lofty., MD;  Location: [facility] ENDOSCOPY;  Service: Gastroenterology;  Laterality: N/A;   SPYGLASS CHOLANGIOSCOPY N/A 07/24/2021   Procedure: SPYGLASS CHOLANGIOSCOPY;  Surgeon: Lemar Lofty., MD;  Location: [facility] ENDOSCOPY;  Service: Gastroenterology;  Laterality: N/A;   STENT REMOVAL  11/09/2020   Procedure: STENT REMOVAL;  Surgeon: Lemar Lofty., MD;  Location: [facility] ENDOSCOPY;  Service: Gastroenterology;;   Francine Graven REMOVAL  11/11/2020   Procedure: STENT REMOVAL;  Surgeon: Lemar Lofty., MD;  Location: [facility] ENDOSCOPY;  Service: Gastroenterology;;   Francine Graven REMOVAL  11/14/2020   Procedure: STENT REMOVAL;  Surgeon: Lemar Lofty., MD;  Location: [facility] ENDOSCOPY;  Service: Gastroenterology;;   Francine Graven REMOVAL  12/20/2020   Procedure: STENT REMOVAL;  Surgeon: Lemar Lofty., MD;  Location: [facility] ENDOSCOPY;  Service: Gastroenterology;;  biliary x2   STENT REMOVAL  07/24/2021   Procedure: AXIOS STENT REMOVAL;  Surgeon: Lemar Lofty., MD;  Location: [facility] ENDOSCOPY;  Service: Gastroenterology;;    Francine Graven REMOVAL  07/30/2022   Procedure: STENT REMOVAL;  Surgeon: Lemar Lofty., MD;  Location: [facility] ENDOSCOPY;  Service: Gastroenterology;;   UPPER ESOPHAGEAL ENDOSCOPIC ULTRASOUND (EUS) N/A 11/07/2020   Procedure: UPPER ESOPHAGEAL ENDOSCOPIC ULTRASOUND (EUS);  Surgeon: Lemar Lofty., MD;  Location: [facility] ENDOSCOPY;  Service: Gastroenterology;  Laterality: N/A;   UPPER GASTROINTESTINAL ENDOSCOPY     WOUND DEBRIDEMENT  11/09/2020   Procedure: CYST NECROSECTOMY;  Surgeon: Meridee Score Netty Starring., MD;  Location: [facility] ENDOSCOPY;  Service: Gastroenterology;;     A IV Location/Drains/Wounds Patient Lines/Drains/Airways Status     Active Line/Drains/Airways     Name Placement date Placement time Site Days   Peripheral IV 12/29/22 20 G Right Antecubital 12/29/22  0400  Antecubital  less than 1   Closed System Drain 1 Right;Lateral RLQ Bulb (JP) 10/09/20  0855  RLQ  811   GI Stent 10 Fr. 10/10/20  1540  --  810   GI Stent 7 Fr. 11/07/20  1456  --  782   GI Stent 11/07/20  1457  --  782   GI Stent 8.5 Fr. 11/09/20  1027  --  780   GI Stent 10 Fr. 11/14/20  1237  --  775   GI Stent  01/29/21  1311  --  699   GI Stent  06/26/21  0840  --  551   GI Stent 7 Fr. 06/26/21  0840  --  551   GI Stent 10 Fr. 07/24/21  1100  --  523   Incision - 4 Ports Umbilicus Medial;Superior Right;Lateral Right;Lateral;Distal 10/09/20  0751  -- 811            Intake/Output Last 24 hours No intake or output data in the 24 hours ending 12/29/22 1349  Labs/Imaging Results for orders placed or performed during the  hospital encounter of 12/28/22 (from the past 48 hour(s))  Troponin I (High Sensitivity)     Status: None   Collection Time: 12/28/22  8:39 AM  Result Value Ref Range   Troponin I (High Sensitivity) 6 <18 ng/L    Comment: (NOTE) Elevated high sensitivity troponin I (hsTnI) values and significant  changes across serial measurements may suggest ACS but many other  chronic and acute  conditions are known to elevate hsTnI results.  Refer to the "Links" section for chest pain algorithms and additional  guidance. Performed at [facility], [address] [place], [place] [address]   Basic metabolic panel     Status: Abnormal   Collection Time: 12/28/22  4:11 PM  Result Value Ref Range   Sodium 134 (L) 135 - 145 mmol/L   Potassium 3.8 3.5 - 5.1 mmol/L   Chloride 98 98 - 111 mmol/L   CO2 25 22 - 32 mmol/L   Glucose, Bld 272 (H) 70 - 99 mg/dL    Comment: Glucose reference range applies only to samples taken after fasting for at least 8 hours.   BUN 12 6 - 20 mg/dL   Creatinine, Ser 8.11 0.61 - 1.24 mg/dL   Calcium 8.9 8.9 - 91.4 mg/dL   GFR, Estimated >78 >29 mL/min    Comment: (NOTE) Calculated using the CKD-EPI Creatinine Equation (2021)    Anion gap 11 5 - 15    Comment: Performed at [facility], [address], [place], [place] [address]  CBC     Status: Abnormal   Collection Time: 12/28/22  4:11 PM  Result Value Ref Range   WBC 13.9 (H) 4.0 - 10.5 K/uL   RBC 4.54 4.22 - 5.81 MIL/uL   Hemoglobin 13.1 13.0 - 17.0 g/dL   HCT 08.6 57.8 - 46.9 %   MCV 91.4 80.0 - 100.0 fL   MCH 28.9 26.0 - 34.0 pg   MCHC 31.6 30.0 - 36.0 g/dL   RDW 62.9 52.8 - 41.3 %   Platelets 695 (H) 150 - 400 K/uL   nRBC 0.0 0.0 - 0.2 %    Comment: Performed at [facility], [address], [place], [place] [address]  Troponin I (High Sensitivity)     Status: None   Collection Time: 12/28/22  4:11 PM  Result Value Ref Range   Troponin I (High Sensitivity) 5 <18 ng/L    Comment: (NOTE) Elevated high sensitivity troponin I (hsTnI) values and significant  changes across serial measurements may suggest ACS but many other  chronic and acute conditions are known to elevate hsTnI results.  Refer to the "Links" section for chest pain algorithms and additional  guidance. Performed at [facility], [address] [place], [place] [address]   Lipase, blood      Status: Abnormal   Collection Time: 12/28/22  5:09 PM  Result Value Ref Range   Lipase 62 (H) 11 - 51 U/L    Comment: Performed at [facility], [address] [place], [place] [address]  CBG monitoring, ED     Status: Abnormal   Collection Time: 12/29/22  2:30 AM  Result Value Ref Range   Glucose-Capillary 184 (H) 70 - 99 mg/dL    Comment: Glucose reference range applies only to samples taken after fasting for at least 8 hours.  POC CBG, ED     Status: Abnormal   Collection Time: 12/29/22  5:41 AM  Result Value  Ref Range   Glucose-Capillary 178 (H) 70 - 99 mg/dL    Comment: Glucose reference range applies only to samples taken after fasting for at least 8 hours.  Lipid panel     Status: Abnormal   Collection Time: 12/29/22  6:50 AM  Result Value Ref Range   Cholesterol 169 0 - 200 mg/dL   Triglycerides 98 <409 mg/dL   HDL 25 (L) >81 mg/dL   Total CHOL/HDL Ratio 6.8 RATIO   VLDL 20 0 - 40 mg/dL   LDL Cholesterol 191 (H) 0 - 99 mg/dL    Comment:        Total Cholesterol/HDL:CHD Risk Coronary Heart Disease Risk Table                     Men   Women  1/2 Average Risk   3.4   3.3  Average Risk       5.0   4.4  2 X Average Risk   9.6   7.1  3 X Average Risk  23.4   11.0        Use the calculated Patient Ratio above and the CHD Risk Table to determine the patient's CHD Risk.        ATP III CLASSIFICATION (LDL):  <100     mg/dL   Optimal  478-295  mg/dL   Near or Above                    Optimal  130-159  mg/dL   Borderline  621-308  mg/dL   High  >657     mg/dL   Very High Performed at [facility], [address], [place], [place] [address]   Troponin I (High Sensitivity)     Status: None   Collection Time: 12/29/22  6:51 AM  Result Value Ref Range   Troponin I (High Sensitivity) 8 <18 ng/L    Comment: (NOTE) Elevated high sensitivity troponin I (hsTnI) values and significant  changes across serial measurements may suggest ACS but many other  chronic and  acute conditions are known to elevate hsTnI results.  Refer to the "Links" section for chest pain algorithms and additional  guidance. Performed at [facility], [address] [place], [place] [address]    DG Chest 2 View  Result Date: 12/28/2022 CLINICAL DATA:  Chest pain. EXAM: CHEST - 2 VIEW COMPARISON:  Chest radiograph dated 11/13/2022. FINDINGS: Left lung base atelectasis/scarring. No focal consolidation, pleural effusion, or pneumothorax. The cardiac silhouette is within normal limits. No acute osseous pathology. IMPRESSION: No active cardiopulmonary disease. Electronically Signed   By: Elgie Collard M.D.   On: 12/28/2022 17:20    Pending Labs Unresulted Labs (From admission, onward)     Start     Ordered   12/30/22 0500  Basic metabolic panel  Tomorrow morning,   R        12/29/22 0557   12/30/22 0500  CBC  Tomorrow morning,   R        12/29/22 0557   12/29/22 0621  Hemoglobin A1c  Add-on,   AD        12/29/22 0620   12/29/22 0531  Rapid urine drug screen (hospital performed)  ONCE - STAT,   STAT        12/29/22 0530            Vitals/Pain Today's Vitals   12/29/22 0722 12/29/22 0730 12/29/22 1000 12/29/22 1025  BP:  (!) 127/94 123/84   Pulse:  67 62   Resp:  17 17   Temp:    98 F (36.7 C)  TempSrc:      SpO2:  96% 97%   PainSc: 1        Isolation Precautions No active isolations  Medications Medications  nitroGLYCERIN (NITROSTAT) SL tablet 0.4 mg (0.4 mg Sublingual Given 12/29/22 0647)  albuterol (PROVENTIL) (2.5 MG/3ML) 0.083% nebulizer solution 3 mL (has no administration in time range)  mometasone-formoterol (DULERA) 200-5 MCG/ACT inhaler 2 puff (2 puffs Inhalation Not Given 12/29/22 0806)  umeclidinium bromide (INCRUSE ELLIPTA) 62.5 MCG/ACT 1 puff (1 puff Inhalation Not Given 12/29/22 0743)  enoxaparin (LOVENOX) injection 40 mg (has no administration in time range)  sodium chloride flush (NS) 0.9 % injection 3 mL (3 mLs Intravenous Given  12/29/22 0955)  sodium chloride flush (NS) 0.9 % injection 3 mL (has no administration in time range)  0.9 %  sodium chloride infusion (has no administration in time range)  acetaminophen (TYLENOL) tablet 650 mg (has no administration in time range)    Or  acetaminophen (TYLENOL) suppository 650 mg (has no administration in time range)  ondansetron (ZOFRAN) tablet 4 mg (has no administration in time range)    Or  ondansetron (ZOFRAN) injection 4 mg (has no administration in time range)  hydrALAZINE (APRESOLINE) injection 10 mg (has no administration in time range)  aspirin chewable tablet 81 mg (81 mg Oral Given 12/29/22 1025)  amLODipine (NORVASC) tablet 5 mg (5 mg Oral Given 12/29/22 7628)  aspirin chewable tablet 324 mg (324 mg Oral Given 12/29/22 0405)  morphine (PF) 2 MG/ML injection 2 mg (2 mg Intravenous Given 12/29/22 0409)    Mobility walks     Focused Assessments Cardiac Assessment Handoff:    No results found for: "CKTOTAL", "CKMB", "CKMBINDEX", "TROPONINI" Lab Results  Component Value Date   DDIMER 12.40 (H) 11/12/2020   Does the Patient currently have chest pain? No    R Recommendations: See Admitting Provider Note  Report given to:   Additional Notes:

## 2022-12-29 NOTE — H&P (Addendum)
History and Physical    Steve Romero JDB:[id number] DOB: 12/15/1970 DOA: 12/28/2022  DOS: the patient was seen and examined on 12/28/2022  PCP: Raliegh Ip, DO   Patient coming from: Home   Chief Complaint:  Chief Complaint  Patient presents with   Chest Pain    HPI:  52 year old male medical history significant for hypertension, hyperlipidemia, chronic a smoker, insulin-dependent diabetes mellitus type 2, COPD, chronic pancreatic insufficiency, BPH, generalized anxiety disorder, schizoaffective disorder and bipolar disorder presented to emergency department with complaining of chest pain, shortness of breath and reporting when he went to see the primary care provider yesterday 12/28/2022 found to have abnormal EKG per patient.  He was referred to emergency department for evaluation. Patient Reported that he has intermittent chest pain over the course of last 1 week.  Chest pain 4-5 out of 10 intensity, substernal, radiating to the left side of his chest, and associated with nausea.  Denies any tingling and numbness around his arm.  Denies any palpitation, shortness of breath headache and blurry vision.  There is no associated aggravating or relieving factor.  No known history of MI/heart attack in the past.  Patient denies use of cocaine.  Patient denies any abdominal pain, vomiting, diarrhea, change of weight and appetite.  No generalized weakness, tremor and sweating.  Patient is compliant with home blood pressure regimen, insulin regimen and reported he tries to keep up with outpatient PCP appointment.  ED Course:  Initial presentation to ED vitals stable except slightly elevated blood pressure 152/109. High-sensitivity troponin 5 unremarkable and EKG showed normal sinus rhythm. BMP showed corrected sodium with blood glucose 137, elevated blood glucose 227.  Otherwise unremarkable CBC showed slightly elevated WBC count 13.9, elevated platelet count 695 and hemoglobin  13.1. Chest x-ray no acute cardiopulmonary process.  In the ED patient received sublingual nitroglycerin once which subsided the chest pain and received aspirin 324 mg once.  With significant cardiac risk factor and HEART score 4 admitting patient for observation for echocardiogram and nuclear medicine stress test.  Review of Systems:  Review of Systems  Constitutional:  Negative for chills, fever, malaise/fatigue and weight loss.  Respiratory:  Negative for cough.   Cardiovascular:  Positive for chest pain. Negative for palpitations, orthopnea and leg swelling.  Gastrointestinal:  Positive for nausea. Negative for heartburn and vomiting.  Musculoskeletal:  Negative for neck pain.  Neurological:  Negative for dizziness and headaches.  Psychiatric/Behavioral:  The patient is not nervous/anxious.     Past Medical History:  Diagnosis Date   Acute pancreatitis without necrosis or infection, unspecified    AKI (acute kidney injury) (HCC) 05/17/2022   Anxiety 10/2014   Ascites    Bipolar disorder [facility]) age 83   COPD (chronic obstructive pulmonary disease) (HCC) 2016   Depression age 42   Enlarged prostate    Hyperlipidemia 2016   Hypertension 2013   Nausea vomiting and diarrhea 06/15/2022   Schizoaffective disorder (HCC) 11/13/2014   Sleep apnea    Does not wear c-pap, sleeps in sitting up position per pt   Thyroid disease 11/2014    Past Surgical History:  Procedure Laterality Date   BALLOON DILATION N/A 11/07/2020   Procedure: BALLOON DILATION;  Surgeon: Lemar Lofty., MD;  Location: [facility] ENDOSCOPY;  Service: Gastroenterology;  Laterality: N/A;   BALLOON DILATION N/A 06/26/2021   Procedure: BALLOON DILATION;  Surgeon: Meridee Score Netty Starring., MD;  Location: [facility] ENDOSCOPY;  Service: Gastroenterology;  Laterality: N/A;   BILIARY DILATION  07/24/2021   Procedure: BILIARY DILATION;  Surgeon: Meridee Score Netty Starring., MD;  Location: [facility] ENDOSCOPY;  Service: Gastroenterology;;    BILIARY DILATION  05/22/2022   Procedure: BILIARY DILATION;  Surgeon: Lemar Lofty., MD;  Location: [facility] ENDOSCOPY;  Service: Gastroenterology;;   BILIARY DILATION  06/16/2022   Procedure: GASTROSTOMY DILATION;  Surgeon: Lemar Lofty., MD;  Location: [facility] ENDOSCOPY;  Service: Gastroenterology;;   BILIARY STENT PLACEMENT N/A 10/10/2020   Procedure: BILIARY STENT PLACEMENT;  Surgeon: Malissa Hippo, MD;  Location: [facility];  Service: Endoscopy;  Laterality: N/A;   BILIARY STENT PLACEMENT  11/09/2020   Procedure: BILIARY STENT PLACEMENT;  Surgeon: Meridee Score Netty Starring., MD;  Location: [facility] ENDOSCOPY;  Service: Gastroenterology;;   BILIARY STENT PLACEMENT  11/07/2020   Procedure: BILIARY STENT PLACEMENT;  Surgeon: Lemar Lofty., MD;  Location: [facility] ENDOSCOPY;  Service: Gastroenterology;;   BILIARY STENT PLACEMENT  11/11/2020   Procedure: BILIARY STENT PLACEMENT;  Surgeon: Lemar Lofty., MD;  Location: [facility] ENDOSCOPY;  Service: Gastroenterology;;   BILIARY STENT PLACEMENT  11/14/2020   Procedure: BILIARY STENT PLACEMENT;  Surgeon: Lemar Lofty., MD;  Location: [facility] ENDOSCOPY;  Service: Gastroenterology;;   BILIARY STENT PLACEMENT N/A 01/29/2021   Procedure: BILIARY STENT PLACEMENT;  Surgeon: Lemar Lofty., MD;  Location: [facility] ENDOSCOPY;  Service: Gastroenterology;  Laterality: N/A;   BILIARY STENT PLACEMENT  06/26/2021   Procedure: BILIARY STENT PLACEMENT;  Surgeon: Meridee Score Netty Starring., MD;  Location: [facility] ENDOSCOPY;  Service: Gastroenterology;;   BILIARY STENT PLACEMENT  07/24/2021   Procedure: BILIARY STENT PLACEMENT;  Surgeon: Lemar Lofty., MD;  Location: [facility] ENDOSCOPY;  Service: Gastroenterology;;   BIOPSY  11/07/2020   Procedure: BIOPSY;  Surgeon: Lemar Lofty., MD;  Location: [facility] ENDOSCOPY;  Service: Gastroenterology;;   BIOPSY  12/20/2020   Procedure: BIOPSY;  Surgeon: Lemar Lofty., MD;  Location: [facility] ENDOSCOPY;   Service: Gastroenterology;;   BIOPSY  06/26/2021   Procedure: BIOPSY;  Surgeon: Lemar Lofty., MD;  Location: [facility] ENDOSCOPY;  Service: Gastroenterology;;   CARPAL TUNNEL RELEASE Left 02/21/2016   Procedure: CARPAL TUNNEL RELEASE;  Surgeon: Vickki Hearing, MD;  Location: [facility];  Service: Orthopedics;  Laterality: Left;   CHOLECYSTECTOMY N/A 10/09/2020   Procedure: LAPAROSCOPIC CHOLECYSTECTOMY;  Surgeon: Lucretia Roers, MD;  Location: [facility];  Service: General;  Laterality: N/A;   CYST ENTEROSTOMY N/A 11/07/2020   Procedure: CYST ENTEROSTOMY;  Surgeon: Lemar Lofty., MD;  Location: [facility] ENDOSCOPY;  Service: Gastroenterology;  Laterality: N/A;   CYST GASTROSTOMY  11/11/2020   Procedure: CYST NECROSECTOMY;  Surgeon: Meridee Score Netty Starring., MD;  Location: [facility] ENDOSCOPY;  Service: Gastroenterology;;   CYST GASTROSTOMY  11/14/2020   Procedure: CYST NECROSECTOMY;  Surgeon: Lemar Lofty., MD;  Location: [facility] ENDOSCOPY;  Service: Gastroenterology;;   CYST GASTROSTOMY  06/16/2022   Procedure: CYST GASTROSTOMY;  Surgeon: Lemar Lofty., MD;  Location: [facility] ENDOSCOPY;  Service: Gastroenterology;;   CYST REMOVAL HAND     CYSTOSCOPY  06/26/2021   Procedure: CYSTOGASTROSTOMY;  Surgeon: Mansouraty, Netty Starring., MD;  Location: [facility] ENDOSCOPY;  Service: Gastroenterology;;   ENDOSCOPIC RETROGRADE CHOLANGIOPANCREATOGRAPHY (ERCP) WITH PROPOFOL N/A 11/09/2020   Procedure: ENDOSCOPIC RETROGRADE CHOLANGIOPANCREATOGRAPHY (ERCP) WITH PROPOFOL;  Surgeon: Lemar Lofty., MD;  Location: [facility] ENDOSCOPY;  Service: Gastroenterology;  Laterality: N/A;   ENDOSCOPIC RETROGRADE CHOLANGIOPANCREATOGRAPHY (ERCP) WITH PROPOFOL N/A 12/20/2020   Procedure: ENDOSCOPIC RETROGRADE CHOLANGIOPANCREATOGRAPHY (ERCP) WITH PROPOFOL;  Surgeon: Meridee Score Netty Starring., MD;  Location: [facility] ENDOSCOPY;  Service: Gastroenterology;  Laterality: N/A;   ENDOSCOPIC RETROGRADE CHOLANGIOPANCREATOGRAPHY (ERCP)  WITH PROPOFOL N/A 01/29/2021   Procedure: ENDOSCOPIC RETROGRADE CHOLANGIOPANCREATOGRAPHY (ERCP) WITH PROPOFOL;  Surgeon: Meridee Score Netty Starring., MD;  Location: [facility] ENDOSCOPY;  Service: Gastroenterology;  Laterality: N/A;   ENDOSCOPIC RETROGRADE CHOLANGIOPANCREATOGRAPHY (ERCP) WITH PROPOFOL N/A 07/24/2021   Procedure: ENDOSCOPIC RETROGRADE CHOLANGIOPANCREATOGRAPHY (ERCP) WITH PROPOFOL;  Surgeon: Meridee Score Netty Starring., MD;  Location: [facility] ENDOSCOPY;  Service: Gastroenterology;  Laterality: N/A;   ERCP N/A 10/10/2020   Procedure: ENDOSCOPIC RETROGRADE CHOLANGIOPANCREATOGRAPHY (ERCP);  Surgeon: Malissa Hippo, MD;  Location: [facility];  Service: Endoscopy;  Laterality: N/A;   ERCP     ERCP N/A 05/22/2022   Procedure: ENDOSCOPIC RETROGRADE CHOLANGIOPANCREATOGRAPHY (ERCP);  Surgeon: Lemar Lofty., MD;  Location: [facility] ENDOSCOPY;  Service: Gastroenterology;  Laterality: N/A;   ESOPHAGOGASTRODUODENOSCOPY N/A 11/11/2020   Procedure: ESOPHAGOGASTRODUODENOSCOPY (EGD);  Surgeon: Lemar Lofty., MD;  Location: [facility] ENDOSCOPY;  Service: Gastroenterology;  Laterality: N/A;   ESOPHAGOGASTRODUODENOSCOPY N/A 11/14/2020   Procedure: ESOPHAGOGASTRODUODENOSCOPY (EGD);  Surgeon: Lemar Lofty., MD;  Location: [facility] ENDOSCOPY;  Service: Gastroenterology;  Laterality: N/A;   ESOPHAGOGASTRODUODENOSCOPY (EGD) WITH PROPOFOL N/A 11/09/2020   Procedure: ESOPHAGOGASTRODUODENOSCOPY (EGD) WITH PROPOFOL;  Surgeon: Meridee Score Netty Starring., MD;  Location: [facility] ENDOSCOPY;  Service: Gastroenterology;  Laterality: N/A;   ESOPHAGOGASTRODUODENOSCOPY (EGD) WITH PROPOFOL N/A 11/07/2020   Procedure: ESOPHAGOGASTRODUODENOSCOPY (EGD) WITH PROPOFOL;  Surgeon: Meridee Score Netty Starring., MD;  Location: [facility] ENDOSCOPY;  Service: Gastroenterology;  Laterality: N/A;   ESOPHAGOGASTRODUODENOSCOPY (EGD) WITH PROPOFOL N/A 12/20/2020   Procedure: ESOPHAGOGASTRODUODENOSCOPY (EGD) WITH PROPOFOL;  Surgeon: Meridee Score Netty Starring., MD;   Location: [facility] ENDOSCOPY;  Service: Gastroenterology;  Laterality: N/A;   ESOPHAGOGASTRODUODENOSCOPY (EGD) WITH PROPOFOL N/A 06/26/2021   Procedure: ESOPHAGOGASTRODUODENOSCOPY (EGD) WITH PROPOFOL;  Surgeon: Meridee Score Netty Starring., MD;  Location: [facility] ENDOSCOPY;  Service: Gastroenterology;  Laterality: N/A;   ESOPHAGOGASTRODUODENOSCOPY (EGD) WITH PROPOFOL N/A 07/24/2021   Procedure: ESOPHAGOGASTRODUODENOSCOPY (EGD) WITH PROPOFOL;  Surgeon: Meridee Score Netty Starring., MD;  Location: [facility] ENDOSCOPY;  Service: Gastroenterology;  Laterality: N/A;  AXIOS STENT   ESOPHAGOGASTRODUODENOSCOPY (EGD) WITH PROPOFOL N/A 05/22/2022   Procedure: ESOPHAGOGASTRODUODENOSCOPY (EGD) WITH PROPOFOL;  Surgeon: Meridee Score Netty Starring., MD;  Location: [facility] ENDOSCOPY;  Service: Gastroenterology;  Laterality: N/A;   ESOPHAGOGASTRODUODENOSCOPY (EGD) WITH PROPOFOL N/A 06/16/2022   Procedure: ESOPHAGOGASTRODUODENOSCOPY (EGD) WITH PROPOFOL;  Surgeon: Meridee Score Netty Starring., MD;  Location: [facility] ENDOSCOPY;  Service: Gastroenterology;  Laterality: N/A;   ESOPHAGOGASTRODUODENOSCOPY (EGD) WITH PROPOFOL N/A 07/30/2022   Procedure: ESOPHAGOGASTRODUODENOSCOPY (EGD) WITH PROPOFOL;  Surgeon: Meridee Score Netty Starring., MD;  Location: [facility] ENDOSCOPY;  Service: Gastroenterology;  Laterality: N/A;   EUS  11/14/2020   Procedure: UPPER ENDOSCOPIC ULTRASOUND (EUS) LINEAR;  Surgeon: Lemar Lofty., MD;  Location: [facility] ENDOSCOPY;  Service: Gastroenterology;;   EUS N/A 06/26/2021   Procedure: UPPER ENDOSCOPIC ULTRASOUND (EUS) RADIAL;  Surgeon: Lemar Lofty., MD;  Location: [facility] ENDOSCOPY;  Service: Gastroenterology;  Laterality: N/A;   EUS N/A 05/22/2022   Procedure: UPPER ENDOSCOPIC ULTRASOUND (EUS) LINEAR;  Surgeon: Lemar Lofty., MD;  Location: [facility] ENDOSCOPY;  Service: Gastroenterology;  Laterality: N/A;   EUS N/A 06/16/2022   Procedure: UPPER ENDOSCOPIC ULTRASOUND (EUS) LINEAR;  Surgeon: Lemar Lofty., MD;  Location: [facility]  ENDOSCOPY;  Service: Gastroenterology;  Laterality: N/A;   FINE NEEDLE ASPIRATION  05/22/2022   Procedure: FINE NEEDLE ASPIRATION;  Surgeon: Meridee Score Netty Starring., MD;  Location: [facility] ENDOSCOPY;  Service: Gastroenterology;;   GASTROINTESTINAL STENT REMOVAL  12/20/2020   Procedure: GASTROINTESTINAL STENT REMOVAL;  Surgeon: Lemar Lofty., MD;  Location: [facility]  ENDOSCOPY;  Service: Gastroenterology;;  cyst gastrostomy stent and double pig tail stents x2 removed   HERNIA REPAIR Left 2002   groin   INGUINAL HERNIA REPAIR Left 04/05/2017   Procedure: RECURRENT HERNIA REPAIR INGUINAL ADULT WITH MESH;  Surgeon: Franky Macho, MD;  Location: [facility];  Service: General;  Laterality: Left;   MASS EXCISION Right 04/29/2020   Procedure: EXCISION MASS RIGHT WRIST;  Surgeon: Betha Loa, MD;  Location: [facility];  Service: Orthopedics;  Laterality: Right;   MASS EXCISION Right 10/09/2020   Procedure: EXCISION MASS, ABDOMINAL WALL, 2CM;  Surgeon: Lucretia Roers, MD;  Location: [facility];  Service: General;  Laterality: Right;   PANCREATIC STENT PLACEMENT  06/26/2021   Procedure: AXIOS STENT PLACEMENT;  Surgeon: Lemar Lofty., MD;  Location: [facility] ENDOSCOPY;  Service: Gastroenterology;;   PANCREATIC STENT PLACEMENT  06/16/2022   Procedure: PANCREATIC STENT PLACEMENT;  Surgeon: Lemar Lofty., MD;  Location: [facility] ENDOSCOPY;  Service: Gastroenterology;;   REMOVAL OF STONES  12/20/2020   Procedure: REMOVAL OF STONES;  Surgeon: Lemar Lofty., MD;  Location: [facility] ENDOSCOPY;  Service: Gastroenterology;;   REMOVAL OF STONES  07/24/2021   Procedure: REMOVAL OF STONES;  Surgeon: Lemar Lofty., MD;  Location: [facility] ENDOSCOPY;  Service: Gastroenterology;;   REMOVAL OF STONES  05/22/2022   Procedure: REMOVAL OF STONES;  Surgeon: Lemar Lofty., MD;  Location: [facility] ENDOSCOPY;  Service: Gastroenterology;;   Dennison Mascot N/A 10/10/2020   Procedure: Dennison Mascot;   Surgeon: Malissa Hippo, MD;  Location: [facility];  Service: Endoscopy;  Laterality: N/A;   SPHINCTEROTOMY  07/24/2021   Procedure: SPHINCTEROTOMY;  Surgeon: Mansouraty, Netty Starring., MD;  Location: [facility] ENDOSCOPY;  Service: Gastroenterology;;   SPLENECTOMY, TOTAL N/A 11/17/2022   Procedure: SPLENECTOMY;  Surgeon: Fritzi Mandes, MD;  Location: [facility] OR;  Service: General;  Laterality: N/A;   SPYGLASS CHOLANGIOSCOPY N/A 12/20/2020   Procedure: YNWGNFAO CHOLANGIOSCOPY;  Surgeon: Lemar Lofty., MD;  Location: [facility] ENDOSCOPY;  Service: Gastroenterology;  Laterality: N/A;   SPYGLASS CHOLANGIOSCOPY N/A 07/24/2021   Procedure: SPYGLASS CHOLANGIOSCOPY;  Surgeon: Lemar Lofty., MD;  Location: [facility] ENDOSCOPY;  Service: Gastroenterology;  Laterality: N/A;   STENT REMOVAL  11/09/2020   Procedure: STENT REMOVAL;  Surgeon: Lemar Lofty., MD;  Location: [facility] ENDOSCOPY;  Service: Gastroenterology;;   Francine Graven REMOVAL  11/11/2020   Procedure: STENT REMOVAL;  Surgeon: Lemar Lofty., MD;  Location: [facility] ENDOSCOPY;  Service: Gastroenterology;;   Francine Graven REMOVAL  11/14/2020   Procedure: STENT REMOVAL;  Surgeon: Lemar Lofty., MD;  Location: [name] ENDOSCOPY;  Service: Gastroenterology;;   Francine Graven REMOVAL  12/20/2020   Procedure: STENT REMOVAL;  Surgeon: Lemar Lofty., MD;  Location: [facility] ENDOSCOPY;  Service: Gastroenterology;;  biliary x2   STENT REMOVAL  07/24/2021   Procedure: AXIOS STENT REMOVAL;  Surgeon: Lemar Lofty., MD;  Location: [facility] ENDOSCOPY;  Service: Gastroenterology;;   Francine Graven REMOVAL  07/30/2022   Procedure: STENT REMOVAL;  Surgeon: Lemar Lofty., MD;  Location: [facility] ENDOSCOPY;  Service: Gastroenterology;;   UPPER ESOPHAGEAL ENDOSCOPIC ULTRASOUND (EUS) N/A 11/07/2020   Procedure: UPPER ESOPHAGEAL ENDOSCOPIC ULTRASOUND (EUS);  Surgeon: Lemar Lofty., MD;  Location: [facility] ENDOSCOPY;  Service: Gastroenterology;  Laterality: N/A;   UPPER  GASTROINTESTINAL ENDOSCOPY     WOUND DEBRIDEMENT  11/09/2020   Procedure: CYST NECROSECTOMY;  Surgeon: Meridee Score Netty Starring., MD;  Location: [facility] ENDOSCOPY;  Service: Gastroenterology;;     reports that he has been smoking cigarettes. He has  a 6.50 pack-year smoking history. He has never used smokeless tobacco. He reports that he does not drink alcohol and does not use drugs.  Allergies  Allergen Reactions   Desipramine Nausea Only and Rash    Family History  Problem Relation Age of Onset   Diabetes Father    Heart disease Father    Heart disease Maternal Grandmother    Stroke Maternal Grandfather    Colon cancer Neg Hx    Esophageal cancer Neg Hx    Inflammatory bowel disease Neg Hx    Liver disease Neg Hx    Pancreatic cancer Neg Hx    Rectal cancer Neg Hx    Stomach cancer Neg Hx     Prior to Admission medications   Medication Sig Start Date End Date Taking? Authorizing Provider  albuterol (VENTOLIN HFA) 108 (90 Base) MCG/ACT inhaler Inhale 2 puffs into the lungs every 6 (six) hours as needed for wheezing or shortness of breath. 11/23/22  Yes Rolly Salter, MD  docusate sodium (COLACE) 100 MG capsule Take 1 capsule (100 mg total) by mouth 2 (two) times daily. Patient taking differently: Take 100 mg by mouth daily as needed for mild constipation. 11/23/22  Yes Fritzi Mandes, MD  feeding supplement, GLUCERNA SHAKE, (GLUCERNA SHAKE) LIQD Take 237 mLs by mouth 2 (two) times daily between meals. Patient taking differently: Take 237 mLs by mouth daily. 11/23/22  Yes Rolly Salter, MD  insulin glargine (LANTUS SOLOSTAR) 100 UNIT/ML Solostar Pen Inject 26 Units into the skin daily. TO REPLACE TOUJEO Patient taking differently: Inject 24 Units into the skin at bedtime. TO REPLACE TOUJEO 12/28/22  Yes Delynn Flavin M, DO  lipase/protease/amylase (CREON) 36000 UNITS CPEP capsule Take 2 capsules (72,000 Units total) by mouth 3 (three) times daily with meals. Patient taking  differently: Take 72,000 Units by mouth 2 (two) times daily with a meal. Take 1 capsule with snacks 05/24/22  Yes Shalhoub, Deno Lunger, MD  mometasone-formoterol (DULERA) 200-5 MCG/ACT AERO Inhale 2 puffs into the lungs 2 (two) times daily. 11/23/22  Yes Rolly Salter, MD  polyethylene glycol (MIRALAX / GLYCOLAX) 17 g packet Take 17 g by mouth daily as needed for mild constipation. 11/23/22  Yes Fritzi Mandes, MD  umeclidinium bromide (INCRUSE ELLIPTA) 62.5 MCG/ACT AEPB Inhale 1 puff into the lungs daily. 11/23/22  Yes Rolly Salter, MD  acetaminophen (TYLENOL) 500 MG tablet Take 2 tablets (1,000 mg total) by mouth every 8 (eight) hours as needed for mild pain. Patient not taking: Reported on 12/29/2022 11/23/22   Fritzi Mandes, MD  Continuous Blood Gluc Receiver (FREESTYLE LIBRE READER) DEVI 1 Units by Does not apply route daily. UAD to test BGs daily. Dx E11.9 09/25/22   Raliegh Ip, DO  Continuous Blood Gluc Sensor (FREESTYLE LIBRE 3 SENSOR) MISC Place 1 sensor on the skin every 14 days. Use to check glucose continuously e11.9 09/25/22   Delynn Flavin M, DO  Insulin Pen Needle (BD PEN NEEDLE NANO U/F) 32G X 4 MM MISC UAD to administer insulin 09/25/22   Raliegh Ip, DO     Physical Exam: Vitals:   12/29/22 0230 12/29/22 0300 12/29/22 0400 12/29/22 0530  BP: (!) 139/95 (!) 145/99 (!) 137/97 (!) 139/93  Pulse: 78 78 81 74  Resp: 16 (!) 21 (!) 21 20  Temp:      TempSrc:      SpO2: 99% 94% 92% 96%    Physical Exam Constitutional:  General: He is not in acute distress.    Appearance: He is not ill-appearing.  Neck:     Thyroid: No thyromegaly.  Cardiovascular:     Rate and Rhythm: Normal rate.     Pulses:          Carotid pulses are 2+ on the right side and 2+ on the left side.      Radial pulses are 2+ on the right side and 2+ on the left side.       Dorsalis pedis pulses are 2+ on the right side and 2+ on the left side.       Posterior tibial pulses are 2+ on  the right side and 2+ on the left side.     Heart sounds: Normal heart sounds.  Pulmonary:     Effort: Pulmonary effort is normal.     Breath sounds: Normal breath sounds.  Abdominal:     General: Bowel sounds are normal.     Palpations: Abdomen is soft.  Musculoskeletal:     Right lower leg: No edema.     Left lower leg: No edema.  Skin:    Capillary Refill: Capillary refill takes less than 2 seconds.  Neurological:     Mental Status: He is alert and oriented to person, place, and time.  Psychiatric:        Mood and Affect: Mood normal.        Behavior: Behavior normal.      Labs on Admission: I have personally reviewed following labs and imaging studies  CBC: Recent Labs  Lab 12/28/22 1611  WBC 13.9*  HGB 13.1  HCT 41.5  MCV 91.4  PLT 695*   Basic Metabolic Panel: Recent Labs  Lab 12/28/22 1611  NA 134*  K 3.8  CL 98  CO2 25  GLUCOSE 272*  BUN 12  CREATININE 1.00  CALCIUM 8.9   GFR: Estimated Creatinine Clearance: 90.2 mL/min (by C-G formula based on SCr of 1 mg/dL). Liver Function Tests: No results for input(s): "AST", "ALT", "ALKPHOS", "BILITOT", "PROT", "ALBUMIN" in the last 168 hours. Recent Labs  Lab 12/28/22 1709  LIPASE 62*   No results for input(s): "AMMONIA" in the last 168 hours. Coagulation Profile: No results for input(s): "INR", "PROTIME" in the last 168 hours. Cardiac Enzymes: Recent Labs  Lab 12/28/22 0839 12/28/22 1611  TROPONINIHS 6 5   BNP (last 3 results) No results for input(s): "BNP" in the last 8760 hours. HbA1C: Recent Labs    12/28/22 1256  HGBA1C 8.3*   CBG: Recent Labs  Lab 12/29/22 0230 12/29/22 0541  GLUCAP 184* 178*   Lipid Profile: No results for input(s): "CHOL", "HDL", "LDLCALC", "TRIG", "CHOLHDL", "LDLDIRECT" in the last 72 hours. Thyroid Function Tests: No results for input(s): "TSH", "T4TOTAL", "FREET4", "T3FREE", "THYROIDAB" in the last 72 hours. Anemia Panel: No results for input(s):  "VITAMINB12", "FOLATE", "FERRITIN", "TIBC", "IRON", "RETICCTPCT" in the last 72 hours. Urine analysis:    Component Value Date/Time   COLORURINE AMBER (A) 06/12/2022 2259   APPEARANCEUR HAZY (A) 06/12/2022 2259   LABSPEC 1.028 06/12/2022 2259   PHURINE 5.0 06/12/2022 2259   GLUCOSEU NEGATIVE 06/12/2022 2259   HGBUR NEGATIVE 06/12/2022 2259   BILIRUBINUR NEGATIVE 06/12/2022 2259   KETONESUR NEGATIVE 06/12/2022 2259   PROTEINUR 30 (A) 06/12/2022 2259   UROBILINOGEN 0.2 10/24/2014 1136   NITRITE NEGATIVE 06/12/2022 2259   LEUKOCYTESUR NEGATIVE 06/12/2022 2259    Radiological Exams on Admission: I have personally reviewed images DG Chest  2 View  Result Date: 12/28/2022 CLINICAL DATA:  Chest pain. EXAM: CHEST - 2 VIEW COMPARISON:  Chest radiograph dated 11/13/2022. FINDINGS: Left lung base atelectasis/scarring. No focal consolidation, pleural effusion, or pneumothorax. The cardiac silhouette is within normal limits. No acute osseous pathology. IMPRESSION: No active cardiopulmonary disease. Electronically Signed   By: Elgie Collard M.D.   On: 12/28/2022 17:20    EKG: My personal interpretation of EKG shows: Normal sinus rhythm.    Assessment/Plan: Principal Problem:   Chest pain Active Problems:   Pancreatic insufficiency   Essential hypertension   Cigarette smoker   History of COPD    Assessment and Plan: Chest pain -Patient is reporting intermittent substernal chest pain over the course of last 1 week.  Chest pain 4-5 out of 10 intensity, substernal, radiating to the left side of his chest, and associated with nausea.  Low suspicion of cardiac related chest pain however given that patient has multiple comorbidities include hypertension, hyperlipidemia, diabetes mellitus type 2 and chronic smoker with HEART score 4  place pt 12 to 16% risk of adverse cardiac event. -Chest pain has been subsided with nitroglycerin in the ED. - High-sensitivity troponin negative and EKG showed  normal sinus rhythm.  ACS ruled out -Per chart review previous echocardiogram from 02/28/2021 showed preserved EF 70 to 75%, left ventricular hypertrophy and normal left ventricular diastolic function - Patient received aspirin 324 -Continue aspirin 81 mg daily -Continue nitroglycerin 0.4 mg subcu lingual every as needed for chest pain. - Continue telemonitoring - Ordered echocardiogram - Ordered nuclear medicine stress test -Checking UDS -Will need to follow-up with cardiogram and stress test result.  Based on the result will do further assessment and plan   Essential hypertension -Initial presentation blood pressure from slight elevated 152/109. - Able to find any active blood pressure regimen on patient's home medication chart and outpatient PCP chart as well. -Starting amlodipine 5 mg daily. - Continue hydralazine 10 mg every 6 as needed for blood pressure more than 160.   Hyperlipidemia - Unable to find any lipid-lowering agent on patient's chart. -Per chart review labs from 09/2019 showed elevated cholesterol 218, elevated TG 217 low HDL 27 and high LDL of 151 -Checking lipid panel today.  - Based on the finding will start Lipitor.   COPD  -Continue  home Dulera 2 puff twice daily, inscure Ellipta 1 puff daily and albuterol every 6 as needed for wheezing or shortness of breath  Pancreatic insufficiency -Patient is currently NPO.  Once diet will be resumed need to restart home Creon.  Insulin-dependent DM type II -A1c 8.3. - Patient reported taking insulin glargine 26 units at the bedtime. -Currently patient is NPO.  Holding insulin regimen now.  Active smoker -Currently patient is smoke 7 to 8 cigarettes in a day. - Counseled patient at the bedside the importance of smoking cessation. - Offered nicotine patch but patient does not like to use   DVT prophylaxis: Lovenox Code Status: Full Code.  Discussed with patient. Diet: Currently patient is n.p.o. for nuclear  medicine stress test. Family Communication: None Disposition Plan: Need to follow-up with a stress test and echocardiogram results. Consults:  none Admission status: Observation, Telemetry bed   Tereasa Coop, MD [facility] 12/29/2022, 6:28 AM    If 7PM-7AM, please contact night-coverage [email or web address] Password [email or web address]

## 2022-12-30 ENCOUNTER — Observation Stay (HOSPITAL_BASED_OUTPATIENT_CLINIC_OR_DEPARTMENT_OTHER): Payer: Medicaid Other

## 2022-12-30 ENCOUNTER — Observation Stay (HOSPITAL_COMMUNITY): Payer: Medicaid Other

## 2022-12-30 DIAGNOSIS — R072 Precordial pain: Secondary | ICD-10-CM

## 2022-12-30 DIAGNOSIS — R079 Chest pain, unspecified: Secondary | ICD-10-CM | POA: Diagnosis not present

## 2022-12-30 LAB — CBC
HCT: 37.8 % — ABNORMAL LOW (ref 39.0–52.0)
Hemoglobin: 12.1 g/dL — ABNORMAL LOW (ref 13.0–17.0)
MCH: 28.9 pg (ref 26.0–34.0)
MCHC: 32 g/dL (ref 30.0–36.0)
MCV: 90.4 fL (ref 80.0–100.0)
Platelets: 736 10*3/uL — ABNORMAL HIGH (ref 150–400)
RBC: 4.18 MIL/uL — ABNORMAL LOW (ref 4.22–5.81)
RDW: 12.9 % (ref 11.5–15.5)
WBC: 10.1 10*3/uL (ref 4.0–10.5)
nRBC: 0 % (ref 0.0–0.2)

## 2022-12-30 LAB — BASIC METABOLIC PANEL
Anion gap: 10 (ref 5–15)
BUN: 15 mg/dL (ref 6–20)
CO2: 26 mmol/L (ref 22–32)
Calcium: 8.7 mg/dL — ABNORMAL LOW (ref 8.9–10.3)
Chloride: 97 mmol/L — ABNORMAL LOW (ref 98–111)
Creatinine, Ser: 0.94 mg/dL (ref 0.61–1.24)
GFR, Estimated: >60 mL/min (ref >60)
Glucose, Bld: 280 mg/dL — ABNORMAL HIGH (ref 70–99)
Potassium: 4.2 mmol/L (ref 3.5–5.1)
Sodium: 133 mmol/L — ABNORMAL LOW (ref 135–145)

## 2022-12-30 LAB — HEPATIC FUNCTION PANEL
ALT: 12 U/L (ref 0–44)
AST: 12 U/L — ABNORMAL LOW (ref 15–41)
Albumin: 2.9 g/dL — ABNORMAL LOW (ref 3.5–5.0)
Alkaline Phosphatase: 145 U/L — ABNORMAL HIGH (ref 38–126)
Bilirubin, Direct: <0.1 mg/dL (ref 0.0–0.2)
Total Bilirubin: 0.7 mg/dL (ref 0.3–1.2)
Total Protein: 6.1 g/dL — ABNORMAL LOW (ref 6.5–8.1)

## 2022-12-30 LAB — GLUCOSE, CAPILLARY: Glucose-Capillary: 227 mg/dL — ABNORMAL HIGH (ref 70–99)

## 2022-12-30 MED ORDER — NITROGLYCERIN 0.4 MG SL SUBL
SUBLINGUAL_TABLET | SUBLINGUAL | Status: AC
  Filled 2022-12-30: qty 2

## 2022-12-30 MED ORDER — INSULIN GLARGINE-YFGN 100 UNIT/ML ~~LOC~~ SOLN
15.0000 [IU] | Freq: Every day | SUBCUTANEOUS | Status: DC
  Filled 2022-12-30: qty 0.15

## 2022-12-30 MED ORDER — METOPROLOL TARTRATE 100 MG PO TABS
100.0000 mg | ORAL_TABLET | Freq: Once | ORAL | Status: AC
  Administered 2022-12-30: 100 mg via ORAL
  Filled 2022-12-30: qty 1

## 2022-12-30 MED ORDER — PANCRELIPASE (LIP-PROT-AMYL) 12000-38000 UNITS PO CPEP: 36000.0000 [IU] | ORAL_CAPSULE | ORAL | Status: DC

## 2022-12-30 MED ORDER — PANCRELIPASE (LIP-PROT-AMYL) 12000-38000 UNITS PO CPEP: 72000.0000 [IU] | ORAL_CAPSULE | Freq: Two times a day (BID) | ORAL | Status: DC

## 2022-12-30 MED ORDER — AMLODIPINE BESYLATE 5 MG PO TABS: 5.0000 mg | ORAL_TABLET | Freq: Every day | ORAL | 0 refills | Status: DC

## 2022-12-30 MED ORDER — IOHEXOL 350 MG/ML SOLN
95.0000 mL | Freq: Once | INTRAVENOUS | Status: AC | PRN
  Administered 2022-12-30: 95 mL via INTRAVENOUS

## 2022-12-30 MED ORDER — INSULIN GLARGINE-YFGN 100 UNIT/ML ~~LOC~~ SOLN
20.0000 [IU] | Freq: Every day | SUBCUTANEOUS | Status: DC
  Filled 2022-12-30: qty 0.2

## 2022-12-30 MED ORDER — NITROGLYCERIN 0.4 MG SL SUBL
0.8000 mg | SUBLINGUAL_TABLET | Freq: Once | SUBLINGUAL | Status: AC
  Administered 2022-12-30: 0.8 mg via SUBLINGUAL

## 2022-12-30 MED ORDER — INSULIN ASPART 100 UNIT/ML IJ SOLN
0.0000 [IU] | Freq: Three times a day (TID) | INTRAMUSCULAR | Status: DC
  Administered 2022-12-30: 5 [IU] via SUBCUTANEOUS

## 2022-12-30 NOTE — Progress Notes (Signed)
Per discharge by md echo canceled.

## 2022-12-30 NOTE — TOC Initial Note (Signed)
Transition of Care [facility]) - Initial/Assessment Note    Patient Details  Name: Steve Romero MRN: [id number] Date of Birth: [birth year]  [facility] of [facility]) CM/SW Contact:    Leone Haven, RN Phone Number: 12/30/2022, 11:50 AM  Clinical Narrative:                 From home alone, indep, he has PCP and insurance on file, he currently has no HH services in place and he states he does not have any DME at home.  He states his Mom or Sister will transport hime home.  He gets his medications from [facility] in [place], pta he is indep.   Expected Discharge Plan: Home/Self Care Barriers to Discharge: Continued Medical Work up   Patient Goals and CMS Choice Patient states their goals for this hospitalization and ongoing recovery are:: return home   Choice offered to / list presented to : NA      Expected Discharge Plan and Services In-house Referral: NA Discharge Planning Services: CM Consult Post Acute Care Choice: NA Living arrangements for the past 2 months: Single Family Home                 DME Arranged: N/A DME Agency: NA       HH Arranged: NA          Prior Living Arrangements/Services Living arrangements for the past 2 months: Single Family Home Lives with:: Self Patient language and need for interpreter reviewed:: Yes Do you feel safe going back to the place where you live?: Yes      Need for Family Participation in Patient Care: No (Comment) Care giver support system in place?: Yes (comment)   Criminal Activity/Legal Involvement Pertinent to Current Situation/Hospitalization: No - Comment as needed  Activities of Daily Living      Permission Sought/Granted Permission sought to share information with : Case Manager                Emotional Assessment Appearance:: Appears stated age Attitude/Demeanor/Rapport: Engaged Affect (typically observed): Appropriate Orientation: : Oriented to Self, Oriented to Place, Oriented to  Time, Oriented to  Situation Alcohol / Substance Use: Not Applicable Psych Involvement: No (comment)  Admission diagnosis:  Precordial chest pain [R07.2] Chest pain [R07.9] Patient Active Problem List   Diagnosis Date Noted   Chest pain 12/29/2022   History of COPD 12/29/2022   Diabetes mellitus treated with insulin (HCC) 11/16/2022   Overweight (BMI 25.0-29.9) 11/16/2022   Abdominal pain, chronic, epigastric 11/12/2022   Chronic generalized abdominal pain 07/23/2022   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   Abnormal LFTs 06/18/2022   Bilious vomiting with nausea 06/18/2022   Acute pancreatitis 06/18/2022   Esophagitis determined by endoscopy 06/17/2022   Nausea vomiting and diarrhea 06/15/2022   Pseudocyst of pancreas 06/15/2022   Acute recurrent pancreatitis 06/15/2022   Hypokalemia 06/13/2022   COPD (chronic obstructive pulmonary disease) (HCC) 06/13/2022   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   Pancreatic pseudocyst 05/19/2022   Prediabetes 05/17/2022   AKI (acute kidney injury) (HCC) 05/17/2022   Erythrocytosis 05/17/2022   Tobacco use disorder 04/14/2022   Pancreatic insufficiency 04/14/2022   History of pancreatitis 09/05/2021   Biliary obstruction 06/04/2021   Pancreatic duct disruption 06/04/2021   Hx of adenomatous colonic polyps 06/04/2021   Acute on chronic pancreatitis (HCC) 03/13/2021   Biliary stricture 03/13/2021   Abnormal CT scan, gastrointestinal tract 03/13/2021   Duodenitis 03/13/2021   Early satiety 01/25/2021   Abnormal CT of liver  01/25/2021   Common bile duct dilation 01/25/2021   Jaundice 01/25/2021   Elevated alkaline phosphatase level 01/25/2021   Other ascites 01/25/2021   Unintentional weight loss 01/25/2021   Hemorrhage following endoscopic retrograde cholangiopancreatography (ERCP) 12/20/2020   Bile leak    Protein-calorie malnutrition, severe 11/07/2020   Ascending cholangitis 11/05/2020   Disorder of bile duct stent 11/01/2020   Pancreatic  necrosis    Empyema of gallbladder    Calculus of gallbladder without cholecystitis without obstruction 09/26/2020   Abdominal wall mass of right upper quadrant 09/26/2020   Neoplasm of uncertain behavior 09/26/2020   Right upper quadrant abdominal pain 09/24/2020   Mass of right upper extremity 02/14/2020   Unilateral recurrent inguinal hernia without obstruction or gangrene    Carpal tunnel syndrome, left 09/24/2016   Essential hypertension 04/22/2015   Hyperlipidemia 04/22/2015   Schizoaffective disorder (HCC) 04/22/2015   Anxiety state 04/22/2015   Cigarette smoker 04/22/2015   Edema 04/22/2015   Lymphadenopathy 04/22/2015   PCP:  Raliegh Ip, DO Pharmacy:   [facility] [address], [place] - [address] [place] [address] Phone: [phone] Fax: [phone]     Social Determinants of Health (SDOH) Social History: SDOH Screenings   Food Insecurity: No Food Insecurity (11/16/2022)  Housing: Low Risk  (11/16/2022)  Transportation Needs: No Transportation Needs (11/16/2022)  Utilities: Not At Risk (11/16/2022)  Depression (PHQ2-9): Medium Risk (12/28/2022)  Tobacco Use: High Risk (12/29/2022)   SDOH Interventions:     Readmission Risk Interventions    06/16/2022    8:03 AM  Readmission Risk Prevention Plan  Post Dischage Appt Complete  Medication Screening Complete  Transportation Screening Complete

## 2022-12-30 NOTE — Progress Notes (Signed)
Pt went to c-t 

## 2022-12-30 NOTE — TOC Transition Note (Signed)
Transition of Care [facility] - CM/SW Discharge Note   Patient Details  Name: Steve Romero MRN: [id number] Date of Birth: 1971-06-29  [facility] CM/SW Contact:  Leone Haven, RN Phone Number: 12/30/2022, 3:23 PM   Clinical Narrative:    Patient is for dc today, he has no needs. He has transport.     Barriers to Discharge: Continued Medical Work up   Patient Goals and CMS Choice   Choice offered to / list presented to : NA  Discharge Placement                         Discharge Plan and Services Additional resources added to the After Visit Summary for   In-house Referral: NA Discharge Planning Services: CM Consult Post Acute Care Choice: NA          DME Arranged: N/A DME Agency: NA       HH Arranged: NA          Social Determinants of Health (SDOH) Interventions SDOH Screenings   Food Insecurity: No Food Insecurity (11/16/2022)  Housing: Low Risk  (11/16/2022)  Transportation Needs: No Transportation Needs (11/16/2022)  Utilities: Not At Risk (11/16/2022)  Depression (PHQ2-9): Medium Risk (12/28/2022)  Tobacco Use: High Risk (12/29/2022)     Readmission Risk Interventions    06/16/2022    8:03 AM  Readmission Risk Prevention Plan  Post Dischage Appt Complete  Medication Screening Complete  Transportation Screening Complete

## 2022-12-30 NOTE — Plan of Care (Signed)

## 2022-12-30 NOTE — Progress Notes (Signed)
PROGRESS NOTE    Steve Romero  YNW:[id number] DOB: 1970/07/11 DOA: 12/28/2022 PCP: Raliegh Ip, DO  51/M with history of necrotizing pancreatitis, pancreatic insufficiency recently underwent distal pancreatectomy splenectomy and gastric raphe by Dr. Freida Busman 5/14, also has history of COPD, tobacco abuse, type 2 diabetes mellitus on insulin presented to the ED with ongoing chest pain off-and-on X 1 week, associated with some radiation down his left arm -In the ER EKG was unremarkable, troponins were negative  Subjective: -Upset that his insulin was not resumed yesterday, he did not get a diet most of the day, was not seen by MDs during the day yesterday  Assessment and Plan:  Atypical chest pain -Off-and-on for the past week, at bedtime troponin negative and EKG is unremarkable as well -Has multiple cardiac risk factors with uncontrolled diabetes and heavy tobacco use, recently cut down -Echo today, cardiology consulted, would benefit from ischemic eval  Type 2 diabetes mellitus -Recent A1c was 8.3, resume glargine, sliding scale  COPD, tobacco abuse -Counseled, continue Dulera, Ellipta, albuterol  Ho chronic pancreatitis, pseudocyst and peripancreatic fibrosis Pancreatic insufficiency -Recently underwent distal pancreatectomy, splenectomy and gastrorrhaphy by Dr. Sophronia Simas on 5/14 -Appears to be recovering, follow-up with Dr. Freida Busman -Resume Creon   DVT prophylaxis: Lovenox Code Status: Full code Family Communication: None present Disposition Plan: Home pending cardiac workup  Consultants:    Procedures:   Antimicrobials:    Objective: Vitals:   12/29/22 2030 12/30/22 0052 12/30/22 0421 12/30/22 0742  BP: (!) 146/96 124/83 121/70 129/71  Pulse: 70 68 68 77  Resp:  19 19 18   Temp: 98 F (36.7 C) 98.4 F (36.9 C) 98 F (36.7 C) 98.3 F (36.8 C)  TempSrc: Oral Oral Oral Oral  SpO2: 98% 99% 100% 97%  Weight:   86 kg     Intake/Output Summary (Last 24  hours) at 12/30/2022 1128 Last data filed at 12/30/2022 0423 Gross per 24 hour  Intake --  Output 300 ml  Net -300 ml   Filed Weights   12/30/22 0421  Weight: 86 kg    Examination:  General exam: Appears calm and comfortable  Respiratory system: Clear to auscultation Cardiovascular system: S1 & S2 heard, RRR.  Abd: Soft, surgical incision noted healing, nontender, bowel sounds present Central nervous system: Alert and oriented. No focal neurological deficits. Extremities: no edema Skin: No rashes Psychiatry:  Mood & affect appropriate.     Data Reviewed:   CBC: Recent Labs  Lab 12/28/22 1611 12/30/22 0049  WBC 13.9* 10.1  HGB 13.1 12.1*  HCT 41.5 37.8*  MCV 91.4 90.4  PLT 695* 736*   Basic Metabolic Panel: Recent Labs  Lab 12/28/22 1611 12/30/22 0049  NA 134* 133*  K 3.8 4.2  CL 98 97*  CO2 25 26  GLUCOSE 272* 280*  BUN 12 15  CREATININE 1.00 0.94  CALCIUM 8.9 8.7*   GFR: Estimated Creatinine Clearance: 96 mL/min (by C-G formula based on SCr of 0.94 mg/dL). Liver Function Tests: No results for input(s): "AST", "ALT", "ALKPHOS", "BILITOT", "PROT", "ALBUMIN" in the last 168 hours. Recent Labs  Lab 12/28/22 1709  LIPASE 62*   No results for input(s): "AMMONIA" in the last 168 hours. Coagulation Profile: No results for input(s): "INR", "PROTIME" in the last 168 hours. Cardiac Enzymes: No results for input(s): "CKTOTAL", "CKMB", "CKMBINDEX", "TROPONINI" in the last 168 hours. BNP (last 3 results) No results for input(s): "PROBNP" in the last 8760 hours. HbA1C: Recent Labs    12/28/22  1256 12/29/22 1616  HGBA1C 8.3* 8.3*   CBG: Recent Labs  Lab 12/29/22 0230 12/29/22 0541  GLUCAP 184* 178*   Lipid Profile: Recent Labs    12/29/22 0650  CHOL 169  HDL 25*  LDLCALC 124*  TRIG 98  CHOLHDL 6.8   Thyroid Function Tests: No results for input(s): "TSH", "T4TOTAL", "FREET4", "T3FREE", "THYROIDAB" in the last 72 hours. Anemia Panel: No  results for input(s): "VITAMINB12", "FOLATE", "FERRITIN", "TIBC", "IRON", "RETICCTPCT" in the last 72 hours. Urine analysis:    Component Value Date/Time   COLORURINE AMBER (A) 06/12/2022 2259   APPEARANCEUR HAZY (A) 06/12/2022 2259   LABSPEC 1.028 06/12/2022 2259   PHURINE 5.0 06/12/2022 2259   GLUCOSEU NEGATIVE 06/12/2022 2259   HGBUR NEGATIVE 06/12/2022 2259   BILIRUBINUR NEGATIVE 06/12/2022 2259   KETONESUR NEGATIVE 06/12/2022 2259   PROTEINUR 30 (A) 06/12/2022 2259   UROBILINOGEN 0.2 10/24/2014 1136   NITRITE NEGATIVE 06/12/2022 2259   LEUKOCYTESUR NEGATIVE 06/12/2022 2259   Sepsis Labs: @LABRCNTIP (procalcitonin:4,lacticidven:4)  )No results found for this or any previous visit (from the past 240 hour(s)).   Radiology Studies: DG Chest 2 View  Result Date: 12/28/2022 CLINICAL DATA:  Chest pain. EXAM: CHEST - 2 VIEW COMPARISON:  Chest radiograph dated 11/13/2022. FINDINGS: Left lung base atelectasis/scarring. No focal consolidation, pleural effusion, or pneumothorax. The cardiac silhouette is within normal limits. No acute osseous pathology. IMPRESSION: No active cardiopulmonary disease. Electronically Signed   By: Elgie Collard M.D.   On: 12/28/2022 17:20     Scheduled Meds:  amLODipine  5 mg Oral Daily   aspirin  81 mg Oral Daily   enoxaparin (LOVENOX) injection  40 mg Subcutaneous Q24H   insulin aspart  0-15 Units Subcutaneous TID WC   insulin glargine-yfgn  20 Units Subcutaneous Daily   metoprolol tartrate  100 mg Oral Once   mometasone-formoterol  2 puff Inhalation BID   sodium chloride flush  3 mL Intravenous Q12H   umeclidinium bromide  1 puff Inhalation Daily   Continuous Infusions:  sodium chloride       LOS: 0 days    Time spent:    Zannie Cove, MD [facility]   12/30/2022, 11:28 AM

## 2022-12-30 NOTE — Consult Note (Addendum)
Cardiology Consultation   Patient ID: Steve Romero MRN: [id number]; DOB: [birth year]  Admit date: 12/28/2022 Date of Consult: 12/30/2022  PCP:  Raliegh Ip, DO   [facility] Providers Cardiologist:  None        Patient Profile:   Steve Romero is a 52 y.o. male with a hx of hypertension, hyperlipidemia, tobacco use (~40 pack year history), type 2 diabetes on insulin, COPD, chronic pancreatic insufficiency, BPH, GAD, schizoaffective disorder, bipolar disorder who is being seen 12/30/2022 for the evaluation of chest pain at the request of Dr. Jomarie Longs.  History of Present Illness:   Steve Romero was seen by his primary care provider yesterday and subsequently sent to the emergency department after being found with an abnormal EKG.  Patient has been having intermittent chest pain for the last week, 1-3 episodes per day lasting up to 15 minutes.  He rates severity at approximately 4-5 out of 10, described as a pressure sensation.  Pain starts centrally but radiates to the left side of his chest. He also feels some left arm numbness. He does associate his chest pain with some mild dyspnea, but otherwise denies associated symptoms such as lightheadedness, dizziness, palpitations.  Patient unable to identify either aggravating or relieving factors.   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 He continues to smoke but has cut back to about 5 cigarettes per day.    Past Medical History:  Diagnosis Date   Acute pancreatitis without necrosis or infection, unspecified    AKI (acute kidney injury) (HCC) 05/17/2022   Anxiety 10/2014   Ascites    Bipolar disorder [facility]) age 23   COPD (chronic obstructive pulmonary disease) (HCC) 2016   Depression age 63   Enlarged prostate    Hyperlipidemia 2016   Hypertension 2013   Nausea vomiting and diarrhea 06/15/2022   Schizoaffective disorder  (HCC) 11/13/2014   Sleep apnea    Does not wear c-pap, sleeps in sitting up position per pt   Thyroid disease 11/2014    Past Surgical History:  Procedure Laterality Date   BALLOON DILATION N/A 11/07/2020   Procedure: BALLOON DILATION;  Surgeon: Lemar Lofty., MD;  Location: [facility] ENDOSCOPY;  Service: Gastroenterology;  Laterality: N/A;   BALLOON DILATION N/A 06/26/2021   Procedure: BALLOON DILATION;  Surgeon: Meridee Score Netty Starring., MD;  Location: [facility] ENDOSCOPY;  Service: Gastroenterology;  Laterality: N/A;   BILIARY DILATION  07/24/2021   Procedure: BILIARY DILATION;  Surgeon: Meridee Score Netty Starring., MD;  Location: [facility] ENDOSCOPY;  Service: Gastroenterology;;   BILIARY DILATION  05/22/2022   Procedure: BILIARY DILATION;  Surgeon: Lemar Lofty., MD;  Location: [facility] ENDOSCOPY;  Service: Gastroenterology;;   BILIARY DILATION  06/16/2022   Procedure: GASTROSTOMY DILATION;  Surgeon: Lemar Lofty., MD;  Location: [facility] ENDOSCOPY;  Service: Gastroenterology;;   BILIARY STENT PLACEMENT N/A 10/10/2020   Procedure: BILIARY STENT PLACEMENT;  Surgeon: Malissa Hippo, MD;  Location: [facility];  Service: Endoscopy;  Laterality: N/A;   BILIARY STENT PLACEMENT  11/09/2020   Procedure: BILIARY STENT PLACEMENT;  Surgeon: Meridee Score Netty Starring., MD;  Location: [facility] ENDOSCOPY;  Service: Gastroenterology;;   BILIARY STENT PLACEMENT  11/07/2020   Procedure: BILIARY STENT PLACEMENT;  Surgeon: Lemar Lofty., MD;  Location: [facility] ENDOSCOPY;  Service: Gastroenterology;;   BILIARY STENT PLACEMENT  11/11/2020   Procedure: BILIARY STENT PLACEMENT;  Surgeon: Lemar Lofty., MD;  Location: [facility] ENDOSCOPY;  Service: Gastroenterology;;  BILIARY STENT PLACEMENT  11/14/2020   Procedure: BILIARY STENT PLACEMENT;  Surgeon: Meridee Score Netty Starring., MD;  Location: [facility] ENDOSCOPY;  Service: Gastroenterology;;   BILIARY STENT PLACEMENT N/A 01/29/2021   Procedure: BILIARY STENT PLACEMENT;   Surgeon: Lemar Lofty., MD;  Location: [facility] ENDOSCOPY;  Service: Gastroenterology;  Laterality: N/A;   BILIARY STENT PLACEMENT  06/26/2021   Procedure: BILIARY STENT PLACEMENT;  Surgeon: Meridee Score Netty Starring., MD;  Location: [facility] ENDOSCOPY;  Service: Gastroenterology;;   BILIARY STENT PLACEMENT  07/24/2021   Procedure: BILIARY STENT PLACEMENT;  Surgeon: Lemar Lofty., MD;  Location: [facility] ENDOSCOPY;  Service: Gastroenterology;;   BIOPSY  11/07/2020   Procedure: BIOPSY;  Surgeon: Lemar Lofty., MD;  Location: [facility] ENDOSCOPY;  Service: Gastroenterology;;   BIOPSY  12/20/2020   Procedure: BIOPSY;  Surgeon: Lemar Lofty., MD;  Location: [facility] ENDOSCOPY;  Service: Gastroenterology;;   BIOPSY  06/26/2021   Procedure: BIOPSY;  Surgeon: Lemar Lofty., MD;  Location: [facility] ENDOSCOPY;  Service: Gastroenterology;;   CARPAL TUNNEL RELEASE Left 02/21/2016   Procedure: CARPAL TUNNEL RELEASE;  Surgeon: Vickki Hearing, MD;  Location: [facility];  Service: Orthopedics;  Laterality: Left;   CHOLECYSTECTOMY N/A 10/09/2020   Procedure: LAPAROSCOPIC CHOLECYSTECTOMY;  Surgeon: Lucretia Roers, MD;  Location: [facility];  Service: General;  Laterality: N/A;   CYST ENTEROSTOMY N/A 11/07/2020   Procedure: CYST ENTEROSTOMY;  Surgeon: Lemar Lofty., MD;  Location: [facility] ENDOSCOPY;  Service: Gastroenterology;  Laterality: N/A;   CYST GASTROSTOMY  11/11/2020   Procedure: CYST NECROSECTOMY;  Surgeon: Meridee Score Netty Starring., MD;  Location: [facility] ENDOSCOPY;  Service: Gastroenterology;;   CYST GASTROSTOMY  11/14/2020   Procedure: CYST NECROSECTOMY;  Surgeon: Lemar Lofty., MD;  Location: [facility] ENDOSCOPY;  Service: Gastroenterology;;   CYST GASTROSTOMY  06/16/2022   Procedure: CYST GASTROSTOMY;  Surgeon: Lemar Lofty., MD;  Location: [facility] ENDOSCOPY;  Service: Gastroenterology;;   CYST REMOVAL HAND     CYSTOSCOPY  06/26/2021   Procedure: CYSTOGASTROSTOMY;  Surgeon:  Mansouraty, Netty Starring., MD;  Location: [facility] ENDOSCOPY;  Service: Gastroenterology;;   ENDOSCOPIC RETROGRADE CHOLANGIOPANCREATOGRAPHY (ERCP) WITH PROPOFOL N/A 11/09/2020   Procedure: ENDOSCOPIC RETROGRADE CHOLANGIOPANCREATOGRAPHY (ERCP) WITH PROPOFOL;  Surgeon: Lemar Lofty., MD;  Location: [facility] ENDOSCOPY;  Service: Gastroenterology;  Laterality: N/A;   ENDOSCOPIC RETROGRADE CHOLANGIOPANCREATOGRAPHY (ERCP) WITH PROPOFOL N/A 12/20/2020   Procedure: ENDOSCOPIC RETROGRADE CHOLANGIOPANCREATOGRAPHY (ERCP) WITH PROPOFOL;  Surgeon: Meridee Score Netty Starring., MD;  Location: [facility] ENDOSCOPY;  Service: Gastroenterology;  Laterality: N/A;   ENDOSCOPIC RETROGRADE CHOLANGIOPANCREATOGRAPHY (ERCP) WITH PROPOFOL N/A 01/29/2021   Procedure: ENDOSCOPIC RETROGRADE CHOLANGIOPANCREATOGRAPHY (ERCP) WITH PROPOFOL;  Surgeon: Meridee Score Netty Starring., MD;  Location: [facility] ENDOSCOPY;  Service: Gastroenterology;  Laterality: N/A;   ENDOSCOPIC RETROGRADE CHOLANGIOPANCREATOGRAPHY (ERCP) WITH PROPOFOL N/A 07/24/2021   Procedure: ENDOSCOPIC RETROGRADE CHOLANGIOPANCREATOGRAPHY (ERCP) WITH PROPOFOL;  Surgeon: Meridee Score Netty Starring., MD;  Location: [facility] ENDOSCOPY;  Service: Gastroenterology;  Laterality: N/A;   ERCP N/A 10/10/2020   Procedure: ENDOSCOPIC RETROGRADE CHOLANGIOPANCREATOGRAPHY (ERCP);  Surgeon: Malissa Hippo, MD;  Location: [facility];  Service: Endoscopy;  Laterality: N/A;   ERCP     ERCP N/A 05/22/2022   Procedure: ENDOSCOPIC RETROGRADE CHOLANGIOPANCREATOGRAPHY (ERCP);  Surgeon: Lemar Lofty., MD;  Location: [facility] ENDOSCOPY;  Service: Gastroenterology;  Laterality: N/A;   ESOPHAGOGASTRODUODENOSCOPY N/A 11/11/2020   Procedure: ESOPHAGOGASTRODUODENOSCOPY (EGD);  Surgeon: Lemar Lofty., MD;  Location: [facility] ENDOSCOPY;  Service: Gastroenterology;  Laterality: N/A;   ESOPHAGOGASTRODUODENOSCOPY N/A 11/14/2020   Procedure: ESOPHAGOGASTRODUODENOSCOPY (EGD);  Surgeon: Lemar Lofty., MD;  Location: [facility]  ENDOSCOPY;  Service: Gastroenterology;  Laterality: N/A;   ESOPHAGOGASTRODUODENOSCOPY (EGD) WITH PROPOFOL N/A 11/09/2020   Procedure: ESOPHAGOGASTRODUODENOSCOPY (EGD) WITH PROPOFOL;  Surgeon: Meridee Score Netty Starring., MD;  Location: [facility] ENDOSCOPY;  Service: Gastroenterology;  Laterality: N/A;   ESOPHAGOGASTRODUODENOSCOPY (EGD) WITH PROPOFOL N/A 11/07/2020   Procedure: ESOPHAGOGASTRODUODENOSCOPY (EGD) WITH PROPOFOL;  Surgeon: Meridee Score Netty Starring., MD;  Location: [facility] ENDOSCOPY;  Service: Gastroenterology;  Laterality: N/A;   ESOPHAGOGASTRODUODENOSCOPY (EGD) WITH PROPOFOL N/A 12/20/2020   Procedure: ESOPHAGOGASTRODUODENOSCOPY (EGD) WITH PROPOFOL;  Surgeon: Meridee Score Netty Starring., MD;  Location: [facility] ENDOSCOPY;  Service: Gastroenterology;  Laterality: N/A;   ESOPHAGOGASTRODUODENOSCOPY (EGD) WITH PROPOFOL N/A 06/26/2021   Procedure: ESOPHAGOGASTRODUODENOSCOPY (EGD) WITH PROPOFOL;  Surgeon: Meridee Score Netty Starring., MD;  Location: [facility] ENDOSCOPY;  Service: Gastroenterology;  Laterality: N/A;   ESOPHAGOGASTRODUODENOSCOPY (EGD) WITH PROPOFOL N/A 07/24/2021   Procedure: ESOPHAGOGASTRODUODENOSCOPY (EGD) WITH PROPOFOL;  Surgeon: Meridee Score Netty Starring., MD;  Location: [facility] ENDOSCOPY;  Service: Gastroenterology;  Laterality: N/A;  AXIOS STENT   ESOPHAGOGASTRODUODENOSCOPY (EGD) WITH PROPOFOL N/A 05/22/2022   Procedure: ESOPHAGOGASTRODUODENOSCOPY (EGD) WITH PROPOFOL;  Surgeon: Meridee Score Netty Starring., MD;  Location: [facility] ENDOSCOPY;  Service: Gastroenterology;  Laterality: N/A;   ESOPHAGOGASTRODUODENOSCOPY (EGD) WITH PROPOFOL N/A 06/16/2022   Procedure: ESOPHAGOGASTRODUODENOSCOPY (EGD) WITH PROPOFOL;  Surgeon: Meridee Score Netty Starring., MD;  Location: [facility] ENDOSCOPY;  Service: Gastroenterology;  Laterality: N/A;   ESOPHAGOGASTRODUODENOSCOPY (EGD) WITH PROPOFOL N/A 07/30/2022   Procedure: ESOPHAGOGASTRODUODENOSCOPY (EGD) WITH PROPOFOL;  Surgeon: Meridee Score Netty Starring., MD;  Location: [facility] ENDOSCOPY;  Service: Gastroenterology;   Laterality: N/A;   EUS  11/14/2020   Procedure: UPPER ENDOSCOPIC ULTRASOUND (EUS) LINEAR;  Surgeon: Lemar Lofty., MD;  Location: [facility] ENDOSCOPY;  Service: Gastroenterology;;   EUS N/A 06/26/2021   Procedure: UPPER ENDOSCOPIC ULTRASOUND (EUS) RADIAL;  Surgeon: Lemar Lofty., MD;  Location: [facility] ENDOSCOPY;  Service: Gastroenterology;  Laterality: N/A;   EUS N/A 05/22/2022   Procedure: UPPER ENDOSCOPIC ULTRASOUND (EUS) LINEAR;  Surgeon: Lemar Lofty., MD;  Location: [facility] ENDOSCOPY;  Service: Gastroenterology;  Laterality: N/A;   EUS N/A 06/16/2022   Procedure: UPPER ENDOSCOPIC ULTRASOUND (EUS) LINEAR;  Surgeon: Lemar Lofty., MD;  Location: [facility] ENDOSCOPY;  Service: Gastroenterology;  Laterality: N/A;   FINE NEEDLE ASPIRATION  05/22/2022   Procedure: FINE NEEDLE ASPIRATION;  Surgeon: Meridee Score Netty Starring., MD;  Location: [facility] ENDOSCOPY;  Service: Gastroenterology;;   GASTROINTESTINAL STENT REMOVAL  12/20/2020   Procedure: GASTROINTESTINAL STENT REMOVAL;  Surgeon: Lemar Lofty., MD;  Location: [facility] ENDOSCOPY;  Service: Gastroenterology;;  cyst gastrostomy stent and double pig tail stents x2 removed   HERNIA REPAIR Left 2002   groin   INGUINAL HERNIA REPAIR Left 04/05/2017   Procedure: RECURRENT HERNIA REPAIR INGUINAL ADULT WITH MESH;  Surgeon: Franky Macho, MD;  Location: [facility];  Service: General;  Laterality: Left;   MASS EXCISION Right 04/29/2020   Procedure: EXCISION MASS RIGHT WRIST;  Surgeon: Betha Loa, MD;  Location: [facility];  Service: Orthopedics;  Laterality: Right;   MASS EXCISION Right 10/09/2020   Procedure: EXCISION MASS, ABDOMINAL WALL, 2CM;  Surgeon: Lucretia Roers, MD;  Location: [facility];  Service: General;  Laterality: Right;   PANCREATIC STENT PLACEMENT  06/26/2021   Procedure: AXIOS STENT PLACEMENT;  Surgeon: Lemar Lofty., MD;  Location: [facility] ENDOSCOPY;  Service: Gastroenterology;;   PANCREATIC STENT  PLACEMENT  06/16/2022   Procedure: PANCREATIC STENT PLACEMENT;  Surgeon: Lemar Lofty., MD;  Location: [facility] ENDOSCOPY;  Service: Gastroenterology;;   REMOVAL OF STONES  12/20/2020  Procedure: REMOVAL OF STONES;  Surgeon: Meridee Score Netty Starring., MD;  Location: [facility] ENDOSCOPY;  Service: Gastroenterology;;   REMOVAL OF STONES  07/24/2021   Procedure: REMOVAL OF STONES;  Surgeon: Lemar Lofty., MD;  Location: [facility] ENDOSCOPY;  Service: Gastroenterology;;   REMOVAL OF STONES  05/22/2022   Procedure: REMOVAL OF STONES;  Surgeon: Lemar Lofty., MD;  Location: [facility] ENDOSCOPY;  Service: Gastroenterology;;   Dennison Mascot N/A 10/10/2020   Procedure: Dennison Mascot;  Surgeon: Malissa Hippo, MD;  Location: [facility];  Service: Endoscopy;  Laterality: N/A;   SPHINCTEROTOMY  07/24/2021   Procedure: SPHINCTEROTOMY;  Surgeon: Mansouraty, Netty Starring., MD;  Location: [facility] ENDOSCOPY;  Service: Gastroenterology;;   SPLENECTOMY, TOTAL N/A 11/17/2022   Procedure: SPLENECTOMY;  Surgeon: Fritzi Mandes, MD;  Location: [facility] OR;  Service: General;  Laterality: N/A;   SPYGLASS CHOLANGIOSCOPY N/A 12/20/2020   Procedure: ACZYSAYT CHOLANGIOSCOPY;  Surgeon: Lemar Lofty., MD;  Location: [facility] ENDOSCOPY;  Service: Gastroenterology;  Laterality: N/A;   SPYGLASS CHOLANGIOSCOPY N/A 07/24/2021   Procedure: SPYGLASS CHOLANGIOSCOPY;  Surgeon: Lemar Lofty., MD;  Location: [facility] ENDOSCOPY;  Service: Gastroenterology;  Laterality: N/A;   STENT REMOVAL  11/09/2020   Procedure: STENT REMOVAL;  Surgeon: Lemar Lofty., MD;  Location: [facility] ENDOSCOPY;  Service: Gastroenterology;;   Francine Graven REMOVAL  11/11/2020   Procedure: STENT REMOVAL;  Surgeon: Lemar Lofty., MD;  Location: [facility] ENDOSCOPY;  Service: Gastroenterology;;   Francine Graven REMOVAL  11/14/2020   Procedure: STENT REMOVAL;  Surgeon: Lemar Lofty., MD;  Location: [facility] ENDOSCOPY;  Service: Gastroenterology;;   Francine Graven REMOVAL  12/20/2020    Procedure: STENT REMOVAL;  Surgeon: Lemar Lofty., MD;  Location: [facility] ENDOSCOPY;  Service: Gastroenterology;;  biliary x2   STENT REMOVAL  07/24/2021   Procedure: AXIOS STENT REMOVAL;  Surgeon: Lemar Lofty., MD;  Location: [facility] ENDOSCOPY;  Service: Gastroenterology;;   Francine Graven REMOVAL  07/30/2022   Procedure: STENT REMOVAL;  Surgeon: Lemar Lofty., MD;  Location: [facility] ENDOSCOPY;  Service: Gastroenterology;;   UPPER ESOPHAGEAL ENDOSCOPIC ULTRASOUND (EUS) N/A 11/07/2020   Procedure: UPPER ESOPHAGEAL ENDOSCOPIC ULTRASOUND (EUS);  Surgeon: Lemar Lofty., MD;  Location: [facility] ENDOSCOPY;  Service: Gastroenterology;  Laterality: N/A;   UPPER GASTROINTESTINAL ENDOSCOPY     WOUND DEBRIDEMENT  11/09/2020   Procedure: CYST NECROSECTOMY;  Surgeon: Meridee Score Netty Starring., MD;  Location: [facility] ENDOSCOPY;  Service: Gastroenterology;;     Home Medications:  Prior to Admission medications   Medication Sig Start Date End Date Taking? Authorizing Provider  albuterol (VENTOLIN HFA) 108 (90 Base) MCG/ACT inhaler Inhale 2 puffs into the lungs every 6 (six) hours as needed for wheezing or shortness of breath. 11/23/22  Yes Rolly Salter, MD  docusate sodium (COLACE) 100 MG capsule Take 1 capsule (100 mg total) by mouth 2 (two) times daily. Patient taking differently: Take 100 mg by mouth daily as needed for mild constipation. 11/23/22  Yes Fritzi Mandes, MD  insulin glargine (LANTUS SOLOSTAR) 100 UNIT/ML Solostar Pen Inject 26 Units into the skin daily. TO REPLACE TOUJEO Patient taking differently: Inject 24 Units into the skin at bedtime. TO REPLACE TOUJEO 12/28/22  Yes Delynn Flavin M, DO  lipase/protease/amylase (CREON) 36000 UNITS CPEP capsule Take 2 capsules (72,000 Units total) by mouth 3 (three) times daily with meals. Patient taking differently: Take 72,000 Units by mouth 2 (two) times daily with a meal. Take 1 capsule with snacks 05/24/22  Yes Shalhoub, Deno Lunger, MD   mometasone-formoterol (DULERA) 200-5 MCG/ACT AERO  Inhale 2 puffs into the lungs 2 (two) times daily. 11/23/22  Yes Rolly Salter, MD  polyethylene glycol (MIRALAX / GLYCOLAX) 17 g packet Take 17 g by mouth daily as needed for mild constipation. 11/23/22  Yes Fritzi Mandes, MD  umeclidinium bromide (INCRUSE ELLIPTA) 62.5 MCG/ACT AEPB Inhale 1 puff into the lungs daily. 11/23/22  Yes Rolly Salter, MD  acetaminophen (TYLENOL) 500 MG tablet Take 2 tablets (1,000 mg total) by mouth every 8 (eight) hours as needed for mild pain. Patient not taking: Reported on 12/29/2022 11/23/22   Fritzi Mandes, MD  Continuous Blood Gluc Receiver (FREESTYLE LIBRE READER) DEVI 1 Units by Does not apply route daily. UAD to test BGs daily. Dx E11.9 09/25/22   Raliegh Ip, DO  Continuous Blood Gluc Sensor (FREESTYLE LIBRE 3 SENSOR) MISC Place 1 sensor on the skin every 14 days. Use to check glucose continuously e11.9 09/25/22   Delynn Flavin M, DO  feeding supplement, GLUCERNA SHAKE, (GLUCERNA SHAKE) LIQD Take 237 mLs by mouth 2 (two) times daily between meals. Patient taking differently: Take 237 mLs by mouth daily. 11/23/22   Rolly Salter, MD  Insulin Pen Needle (BD PEN NEEDLE NANO U/F) 32G X 4 MM MISC UAD to administer insulin 09/25/22   Raliegh Ip, DO    Inpatient Medications: Scheduled Meds:  amLODipine  5 mg Oral Daily   aspirin  81 mg Oral Daily   enoxaparin (LOVENOX) injection  40 mg Subcutaneous Q24H   insulin aspart  0-15 Units Subcutaneous TID WC   insulin glargine-yfgn  20 Units Subcutaneous Daily   mometasone-formoterol  2 puff Inhalation BID   sodium chloride flush  3 mL Intravenous Q12H   umeclidinium bromide  1 puff Inhalation Daily   Continuous Infusions:  sodium chloride     PRN Meds: sodium chloride, acetaminophen **OR** acetaminophen, albuterol, hydrALAZINE, nitroGLYCERIN, ondansetron **OR** ondansetron (ZOFRAN) IV, sodium chloride flush  Allergies:    Allergies   Allergen Reactions   Desipramine Nausea Only and Rash    Social History:   Social History   Socioeconomic History   Marital status: Single    Spouse name: Not on file   Number of children: 0   Years of education: Not on file   Highest education level: Not on file  Occupational History   Not on file  Tobacco Use   Smoking status: Every Day    Packs/day: 0.25    Years: 26.00    Additional pack years: 0.00    Total pack years: 6.50    Types: Cigarettes   Smokeless tobacco: Never   Tobacco comments:    4-5 cig daily as of 10/07/2020.  Vaping Use   Vaping Use: Never used  Substance and Sexual Activity   Alcohol use: No   Drug use: No   Sexual activity: Never    Birth control/protection: None  Other Topics Concern   Not on file  Social History Narrative   Not on file   Social Determinants of Health   Financial Resource Strain: Not on file  Food Insecurity: No Food Insecurity (11/16/2022)   Hunger Vital Sign    Worried About Running Out of Food in the Last Year: Never true    Ran Out of Food in the Last Year: Never true  Transportation Needs: No Transportation Needs (11/16/2022)   PRAPARE - Administrator, Civil Service (Medical): No    Lack of Transportation (Non-Medical): No  Physical Activity: Not  on file  Stress: Not on file  Social Connections: Not on file  Intimate Partner Violence: Not At Risk (11/16/2022)   Humiliation, Afraid, Rape, and Kick questionnaire    Fear of Current or Ex-Partner: No    Emotionally Abused: No    Physically Abused: No    Sexually Abused: No    Family History:    Family History  Problem Relation Age of Onset   Diabetes Father    Heart disease Father    Heart disease Maternal Grandmother    Stroke Maternal Grandfather    Colon cancer Neg Hx    Esophageal cancer Neg Hx    Inflammatory bowel disease Neg Hx    Liver disease Neg Hx    Pancreatic cancer Neg Hx    Rectal cancer Neg Hx    Stomach cancer Neg Hx       ROS:  Please see the history of present illness.   All other ROS reviewed and negative.     Physical Exam/Data:   Vitals:   12/29/22 2030 12/30/22 0052 12/30/22 0421 12/30/22 0742  BP: (!) 146/96 124/83 121/70 129/71  Pulse: 70 68 68 77  Resp:  19 19 18   Temp: 98 F (36.7 C) 98.4 F (36.9 C) 98 F (36.7 C) 98.3 F (36.8 C)  TempSrc: Oral Oral Oral Oral  SpO2: 98% 99% 100% 97%  Weight:   86 kg     Intake/Output Summary (Last 24 hours) at 12/30/2022 1019 Last data filed at 12/30/2022 0423 Gross per 24 hour  Intake --  Output 300 ml  Net -300 ml      12/30/2022    4:21 AM 12/28/2022    1:01 PM 11/23/2022    6:43 AM  Last 3 Weights  Weight (lbs) 189 lb 9.5 oz 192 lb 210 lb 14.4 oz  Weight (kg) 86 kg 87.091 kg 95.664 kg     Body mass index is 27.2 kg/m.  General:  Well nourished, well developed, in no acute distress HEENT: normal Neck: no JVD Vascular: No carotid bruits; Distal pulses 2+ bilaterally Cardiac:  normal S1, S2; RRR; no murmur Lungs:  clear to auscultation bilaterally, no wheezing, rhonchi or rales  Abd: soft, nontender, no hepatomegaly  Ext: no edema Musculoskeletal:  No deformities, BUE and BLE strength normal and equal Skin: warm and dry  Neuro:  CNs 2-12 intact, no focal abnormalities noted Psych:  Normal affect   EKG:  The EKG was personally reviewed and demonstrates:  sinus rhythm. No acute T wave or ST segment changes Telemetry:  Telemetry was personally reviewed and demonstrates:  sinus rhythm  Relevant CV Studies:  03/03/21 TTE  IMPRESSIONS     1. Subvalvular LVOT acceleration is noted. Left ventricular ejection  fraction, by estimation, is 70 to 75%. The left ventricle has hyperdynamic  function. The left ventricle has no regional wall motion abnormalities.  There is mild left ventricular  hypertrophy. Left ventricular diastolic parameters were normal.   2. Right ventricular systolic function is normal. The right ventricular  size  is normal.   3. The mitral valve is grossly normal. No evidence of mitral valve  regurgitation.   4. The aortic valve is tricuspid. Aortic valve regurgitation is not  visualized. No aortic stenosis is present.   5. The inferior vena cava is normal in size with <50% respiratory  variability, suggesting right atrial pressure of 8 mmHg.   Comparison(s): Changes from prior study are noted. 10/11/2020: LVEF >75%.   FINDINGS  Left Ventricle: Subvalvular LVOT acceleration is noted. Left ventricular  ejection fraction, by estimation, is 70 to 75%. The left ventricle has  hyperdynamic function. The left ventricle has no regional wall motion  abnormalities. Definity contrast agent  was given IV to delineate the left ventricular endocardial borders. The  left ventricular internal cavity size was normal in size. There is mild  left ventricular hypertrophy. Left ventricular diastolic parameters were  normal.   Right Ventricle: The right ventricular size is normal. No increase in  right ventricular wall thickness. Right ventricular systolic function is  normal.   Left Atrium: Left atrial size was normal in size.   Right Atrium: Right atrial size was normal in size.   Pericardium: There is no evidence of pericardial effusion.   Mitral Valve: The mitral valve is grossly normal. No evidence of mitral  valve regurgitation. MV peak gradient, 3.5 mmHg. The mean mitral valve  gradient is 1.0 mmHg.   Tricuspid Valve: The tricuspid valve is grossly normal. Tricuspid valve  regurgitation is trivial.   Aortic Valve: The aortic valve is tricuspid. Aortic valve regurgitation is  not visualized. No aortic stenosis is present. Aortic valve mean gradient  measures 3.0 mmHg. Aortic valve peak gradient measures 4.8 mmHg. Aortic  valve area, by VTI measures 2.60  cm.   Pulmonic Valve: The pulmonic valve was normal in structure. Pulmonic valve  regurgitation is not visualized.   Aorta: The aortic root  and ascending aorta are structurally normal, with  no evidence of dilitation.   Venous: The inferior vena cava is normal in size with less than 50%  respiratory variability, suggesting right atrial pressure of 8 mmHg.   IAS/Shunts: No atrial level shunt detected by color flow Doppler.   Laboratory Data:  High Sensitivity Troponin:   Recent Labs  Lab 12/28/22 0839 12/28/22 1611 12/29/22 0651 12/29/22 1630  TROPONINIHS 6 5 8 6      Chemistry Recent Labs  Lab 12/28/22 1611 12/30/22 0049  NA 134* 133*  K 3.8 4.2  CL 98 97*  CO2 25 26  GLUCOSE 272* 280*  BUN 12 15  CREATININE 1.00 0.94  CALCIUM 8.9 8.7*  GFRNONAA >60 >60  ANIONGAP 11 10    No results for input(s): "PROT", "ALBUMIN", "AST", "ALT", "ALKPHOS", "BILITOT" in the last 168 hours. Lipids  Recent Labs  Lab 12/29/22 0650  CHOL 169  TRIG 98  HDL 25*  LDLCALC 124*  CHOLHDL 6.8    Hematology Recent Labs  Lab 12/28/22 1611 12/30/22 0049  WBC 13.9* 10.1  RBC 4.54 4.18*  HGB 13.1 12.1*  HCT 41.5 37.8*  MCV 91.4 90.4  MCH 28.9 28.9  MCHC 31.6 32.0  RDW 13.1 12.9  PLT 695* 736*   Thyroid No results for input(s): "TSH", "FREET4" in the last 168 hours.  BNPNo results for input(s): "BNP", "PROBNP" in the last 168 hours.  DDimer No results for input(s): "DDIMER" in the last 168 hours.   Radiology/Studies:  DG Chest 2 View  Result Date: 12/28/2022 CLINICAL DATA:  Chest pain. EXAM: CHEST - 2 VIEW COMPARISON:  Chest radiograph dated 11/13/2022. FINDINGS: Left lung base atelectasis/scarring. No focal consolidation, pleural effusion, or pneumothorax. The cardiac silhouette is within normal limits. No acute osseous pathology. IMPRESSION: No active cardiopulmonary disease. Electronically Signed   By: Elgie Collard M.D.   On: 12/28/2022 17:20     Assessment and Plan:   Atypical chest pain  Patient with intermittent chest pain over the last week. No exertional  provocation. HS troponin negative and no acute  ischemic changes on ECG. Given personal and family risk factors for CAD, will check cardiac CTA while patient is admitted. Will also follow echo as ordered by primary team.   Cardiac CTA today, Metoprolol 100mg  x1 dose ordered to slow HR for study.  Patient will need optimization of lipid management for primary prevention of CAD. If CT and echo normal, consider non-cardiac etiology of chest pain and would discontinue ASA.   Hyperlipidemia  LDL 124 per lipid panel this admission. Cardiac CTA pending. If no CAD, LDL goal <100. No recent LFTs. Given abd history, will check prior to statin initiation.   Hypertension  BP initially elevated, now stable on Amlodipine 5mg , at goal <130/80 mmHg. Given DM, would favor ARB or ACEi over CCB for hypertension.    Per primary team: COPD Pancreatic insufficiency DM type II  Risk Assessment/Risk Scores:      For questions or updates, please contact [facility] Please consult [email or web address] for contact info under    Signed, Perlie Gold, PA-C  12/30/2022 10:19 AM  Patient seen, examined. Available data reviewed. Agree with findings, assessment, and plan as outlined by Perlie Gold, PA-C.  The patient is independently interviewed and examined.  He is alert, oriented, in no distress.  He is sitting up in a chair at the bedside.  HEENT is normal.  Lungs are clear, heart is regular rate and rhythm with a soft ejection murmur at the right upper sternal border, JVP is normal, abdomen is soft and nontender, extremities have no edema, skin is warm and dry with no rash.  EKG shows normal sinus rhythm and is within normal limits.  High-sensitivity troponins are negative.  Low suspicion for acute coronary syndrome.  The patient's pain description is primarily atypical where he has had some pressure-like sensation but mostly is having intermittent sharp pains in the left chest unrelated to any exertion and they are fleeting in nature.  I agree with a  gated coronary CTA to further evaluate for ischemic heart disease.  If this study is negative, I would not anticipate him requiring any more inpatient evaluation.  Will adjust his medical therapy as appropriate based on CTA findings.  Otherwise as outlined above.  Tonny Bollman, M.D. 12/30/2022 1:00 PM

## 2022-12-31 ENCOUNTER — Telehealth: Payer: Self-pay

## 2022-12-31 NOTE — Transitions of Care (Post Inpatient/ED Visit) (Signed)
   12/31/2022  Name: Steve Romero MRN: [id number] DOB: [birth year]  Today's TOC FU Call Status: Unsuccessful Call (1st Attempt) Date: 12/31/22  Attempted to reach the patient regarding the most recent Inpatient/ED visit.  Follow Up Plan: Additional outreach attempts will be made to reach the patient to complete the Transitions of Care (Post Inpatient/ED visit) call.   Signature Karena Addison, LPN [facility] Nurse Health Advisor Direct Dial [phone]

## 2023-01-04 NOTE — Transitions of Care (Post Inpatient/ED Visit) (Signed)
01/04/2023  Name: Steve Romero MRN: [id number] DOB: 07/22/1970  Today's TOC FU Call Status: Today's TOC FU Call Status:: Successful TOC FU Call Competed Unsuccessful Call (1st Attempt) Date: 12/31/22 [facility] FU Call Complete Date: 01/04/23  Transition Care Management Follow-up Telephone Call Date of Discharge: 12/30/22 Discharge Facility: [facility]) Type of Discharge: Inpatient Admission Primary Inpatient Discharge Diagnosis:: precordial pain How have you been since you were released from the hospital?: Same Any questions or concerns?: No  Items Reviewed: Did you receive and understand the discharge instructions provided?: Yes Medications obtained,verified, and reconciled?: Yes (Medications Reviewed) Any new allergies since your discharge?: No Dietary orders reviewed?: Yes Do you have support at home?: No  Medications Reviewed Today: Medications Reviewed Today     Reviewed by Karena Addison, LPN (Licensed Practical Nurse) on 01/04/23 at 1709  Med List Status: <None>   Medication Order Taking? Sig Documenting Provider Last Dose Status Informant  acetaminophen (TYLENOL) 500 MG tablet [id number] No Take 2 tablets (1,000 mg total) by mouth every 8 (eight) hours as needed for mild pain.  Patient not taking: Reported on 12/29/2022   Fritzi Mandes, MD Unknown Active Self  albuterol (VENTOLIN HFA) 108 (90 Base) MCG/ACT inhaler [id number] No Inhale 2 puffs into the lungs every 6 (six) hours as needed for wheezing or shortness of breath. Rolly Salter, MD 12/28/2022 am Active Self  amLODipine (NORVASC) 5 MG tablet [id number]  Take 1 tablet (5 mg total) by mouth daily. Zannie Cove, MD  Active   Continuous Blood Gluc Receiver (FREESTYLE LIBRE READER) DEVI [id number] No 1 Units by Does not apply route daily. UAD to test BGs daily. Dx E11.9 Raliegh Ip, DO Unknown Active Self  Continuous Blood Gluc Sensor (FREESTYLE LIBRE 3 SENSOR) MISC [id number] No Place 1 sensor on the skin  every 14 days. Use to check glucose continuously e11.9 Delynn Flavin M, DO Unknown Active Self  docusate sodium (COLACE) 100 MG capsule [id number] No Take 1 capsule (100 mg total) by mouth 2 (two) times daily.  Patient taking differently: Take 100 mg by mouth daily as needed for mild constipation.   Fritzi Mandes, MD Past Week Active Self  feeding supplement, GLUCERNA SHAKE, (GLUCERNA SHAKE) LIQD [id number] No Take 237 mLs by mouth 2 (two) times daily between meals.  Patient taking differently: Take 237 mLs by mouth daily.   Rolly Salter, MD Unknown Active Self  insulin glargine (LANTUS SOLOSTAR) 100 UNIT/ML Solostar Pen [id number] No Inject 26 Units into the skin daily. TO REPLACE TOUJEO  Patient taking differently: Inject 24 Units into the skin at bedtime. TO REPLACE Shelbie Ammons, DO 12/27/2022 Active Self  Insulin Pen Needle (BD PEN NEEDLE NANO U/F) 32G X 4 MM MISC [id number] No UAD to administer insulin Delynn Flavin M, DO Taking Active Self  lipase/protease/amylase (CREON) 36000 UNITS CPEP capsule [id number] No Take 2 capsules (72,000 Units total) by mouth 3 (three) times daily with meals.  Patient taking differently: Take 72,000 Units by mouth 2 (two) times daily with a meal. Take 1 capsule with snacks   Shalhoub, Deno Lunger, MD 12/27/2022 pm Active Self, Pharmacy Records           Med Note [facility], Elvin So   Tue Dec 29, 2022  4:43 AM)    mometasone-formoterol [facility]) 200-5 MCG/ACT AERO [id number] No Inhale 2 puffs into the lungs 2 (two) times daily. Rolly Salter, MD 12/28/2022 am Active Self  polyethylene glycol (MIRALAX /  GLYCOLAX) 17 g packet [id number] No Take 17 g by mouth daily as needed for mild constipation. Fritzi Mandes, MD Past Week Active Self  umeclidinium bromide (INCRUSE ELLIPTA) 62.5 MCG/ACT AEPB [id number] No Inhale 1 puff into the lungs daily. Rolly Salter, MD 12/28/2022 Active Self            Home Care and Equipment/Supplies: Were Home Health  Services Ordered?: NA Any new equipment or medical supplies ordered?: NA  Functional Questionnaire: Do you need assistance with bathing/showering or dressing?: No Do you need assistance with meal preparation?: No Do you need assistance with eating?: No Do you have difficulty maintaining continence: No Do you need assistance with getting out of bed/getting out of a chair/moving?: No Do you have difficulty managing or taking your medications?: No  Follow up appointments reviewed: PCP Follow-up appointment confirmed?: No MD Provider Line Number:[phone] Given: No Specialist Hospital Follow-up appointment confirmed?: Yes Date of Specialist follow-up appointment?: 02/03/23 Follow-Up Specialty Provider:: cardio Do you need transportation to your follow-up appointment?: No Do you understand care options if your condition(s) worsen?: Yes-patient verbalized understanding    SIGNATURE Karena Addison, LPN [facility] Nurse Health Advisor Direct Dial [phone]

## 2023-01-05 ENCOUNTER — Ambulatory Visit: Payer: MEDICAID | Admitting: Pharmacist

## 2023-01-05 DIAGNOSIS — E1165 Type 2 diabetes mellitus with hyperglycemia: Secondary | ICD-10-CM

## 2023-01-05 NOTE — Progress Notes (Signed)
S:        Chief Complaint  Patient presents with   Medication Management      T2DM, insulin teaching    52 y.o. male who presents for diabetes evaluation, education, and management.  PMH is significant for HTN, COPD, newly diagnosed type 2 diabetes, and pancreatic necrosis.  Patient was referred and last seen by Primary Care Provider, Dr. Nadine Counts, on 12/28/2022.    At last visit, A1c was elevated 7.7%-->8.3% and due to the complex nature of his pancreatic issues, he was placed on insulin versus oral agents.    Today, patient arrives in good spirits and presents without any assistance.   Patient reports Diabetes was diagnosed in March 2024 in the setting of recurrent pancreatitis.    Current diabetes medications include: Lantus 24units daily Current hyperlipidemia medications include: none   Patient reports adherence to taking all medications as prescribed, although he has just picked up his insulin today.    Insurance coverage: Medicaid   Patient denies hypoglycemic events.   Reported home fasting blood sugars: 100-300s; most often 200s   Reported 2 hour post-meal/random blood sugars: 200s. In clinic BG on glucometer: 231 mg/dL    Patient reports nocturia (nighttime urination).  Patient denies neuropathy (nerve pain). Patient reports visual changes - blurry vision when reading.  Patient denies self foot exams.    Patient reported dietary habits: Eats 1 meals/day Breakfast: typically skip  Lunch: typically skip  Dinner: baked chicken thighs, black eyed peas, biscuit Snacks: doesn't  Drinks: water, 2 Mtn. Dew, Large sweet tea from [employer] ~2x/week      O:    Recent Labs       Lab Results  Component Value Date    HGBA1C 8.3 (H) 12/28/2022      There were no vitals filed for this visit.   Lipid Panel  Labs (Brief)          Component Value Date/Time    CHOL 218 (H) 10/04/2019 1433    TRIG 47 01/23/2021 1315    HDL 27 (L) 10/04/2019 1433     CHOLHDL 8.1 (H) 10/04/2019 1433    CHOLHDL 6.9 (H) 04/22/2015 0828    VLDL 46 (H) 04/22/2015 0828    LDLCALC 151 (H) 10/04/2019 1433        Clinical Atherosclerotic Cardiovascular Disease (ASCVD): No  The 10-year ASCVD risk score (Arnett DK, et al., 2019) is: 40.2%   Values used to calculate the score:     Age: 61 years     Sex: Male     Is Non-Hispanic African American: No     Diabetic: Yes     Tobacco smoker: Yes     Systolic Blood Pressure: 145 mmHg     Is BP treated: Yes     HDL Cholesterol: 27 mg/dL     Total Cholesterol: 218 mg/dL    A/P: Diabetes, recently diagnosed, currently above goal based on A1c (7.7%). Patient is able to verbalize appropriate hypoglycemia management plan. Medication adherence appears appropriate. Patient teach back method used for glucometer and insulin administration. -Continued basal insulin Lantus (insulin glargine) 24 units daily.  -Placed FreeStyle Libre 3 sensor in clinic today; using reader device (cell phone not compatible)  -Patient educated on purpose, proper use, and potential adverse effects of insulin.  -Extensively discussed pathophysiology of diabetes, recommended lifestyle interventions, dietary effects on blood sugar control.  -Counseled on s/sx of and management of hypoglycemia.    ASCVD risk -  primary prevention in patient with newly diagnosed diabetes. Last LDL is 151 (in 2021) not at goal of <70 mg/dL. ASCVD risk factors include T2DM and 10-year ASCVD risk score of 40.2%. moderate intensity statin indicated.  -Consider statin at follow up.    Written patient instructions provided. Patient verbalized understanding of treatment plan.  Total time in face to face counseling 40 minutes.       Kieth Brightly, PharmD, BCACP Clinical Pharmacist, [facility]

## 2023-01-12 NOTE — Discharge Summary (Signed)
Physician Discharge Summary  Steve Romero WUJ:[id number] DOB: [birth year] DOA: 12/28/2022  PCP: Raliegh Ip, DO  Admit date: 12/28/2022 Discharge date: 6/26//2024  Time spent: 35 minutes  Recommendations for Outpatient Follow-up:  PCP in 1 week General surgery Dr. 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 Freida Busman 5/14, also has history of COPD, tobacco abuse, type 2 diabetes mellitus on insulin presented to the ED with ongoing chest pain off-and-on X 1 week, associated with some radiation down his left arm -In the ER EKG was unremarkable, troponins were negative  Hospital Course:   Atypical chest pain -Off-and-on for the past week, at bedtime troponin negative and EKG is unremarkable as well -Has multiple cardiac risk factors with uncontrolled diabetes and heavy tobacco use, recently cut down -Echo was unremarkable, noted preserved EF and normal wall motion -Seen by cardiology in consultation, underwent coronary CTA which showed calcium score of 35%, cards felt his chest pain was noncardiac, rest recommended risk factor modification and follow-up with surgery for his recent pancreatectomy follow-up   Type 2 diabetes mellitus -Recent A1c was 8.3, resume glargine, sliding scale   COPD, tobacco abuse -Counseled, continue Dulera, Ellipta, albuterol   Ho chronic pancreatitis, pseudocyst and peripancreatic fibrosis Pancreatic insufficiency -Recently underwent distal pancreatectomy, splenectomy and  gastrorrhaphy by Dr. Sophronia Simas on 5/14 -Appears to be recovering, follow-up with Dr. Freida Busman -Resume Creon      Procedures: Coronary CT  Consultations: Cardiology  Discharge Exam: Vitals:   12/30/22 0742 12/30/22 1152  BP: 129/71 134/87  Pulse: 77 87  Resp: 18 19  Temp: 98.3 F (36.8 C) 97.8 F (36.6 C)  SpO2: 97% 99%   Gen: Awake, Alert, Oriented X 3,  HEENT: no JVD Lungs: Good air movement bilaterally, CTAB CVS: S1S2/RRR Abd: soft, Non tender, non distended, BS present Extremities: No edema Skin: no new rashes on exposed skin   Discharge Instructions    Allergies as of 12/30/2022       Reactions   Desipramine Nausea Only, Rash        Medication List     TAKE these medications    acetaminophen 500 MG tablet Commonly known as: TYLENOL Take 2 tablets (1,000 mg total) by mouth every 8 (eight) hours as needed for mild pain.   albuterol 108 (90 Base) MCG/ACT inhaler Commonly known as: VENTOLIN HFA Inhale 2 puffs into the lungs every 6 (six) hours as needed for wheezing or shortness of breath.   amLODipine 5 MG tablet Commonly known as: NORVASC Take 1 tablet (5 mg total) by mouth daily.   BD Pen Needle Nano U/F 32G X 4 MM Misc Generic drug: Insulin Pen Needle UAD to administer insulin   docusate sodium 100 MG capsule Commonly known as: COLACE Take 1 capsule (100 mg total) by mouth 2 (two) times daily. What changed:  when to take this reasons to take this   feeding supplement (GLUCERNA SHAKE) Liqd Take 237 mLs by mouth 2 (two) times daily between meals. What changed: when to take this   FreeStyle  Libre 3 Sensor Misc Place 1 sensor on the skin every 14 days. Use to check glucose continuously e11.9   FreeStyle Libre Reader Devi 1 Units by Does not apply route daily. UAD to test BGs daily. Dx E11.9   Lantus SoloStar 100 UNIT/ML Solostar Pen Generic drug: insulin glargine Inject 26 Units into the skin daily. TO REPLACE TOUJEO What changed:   how much to take when to take this   lipase/protease/amylase 16109 UNITS Cpep capsule Commonly known as: CREON Take 2 capsules (72,000 Units total) by mouth 3 (three) times daily with meals. What changed:  when to take this additional instructions   mometasone-formoterol 200-5 MCG/ACT Aero Commonly known as: DULERA Inhale 2 puffs into the lungs 2 (two) times daily.   polyethylene glycol 17 g packet Commonly known as: MIRALAX / GLYCOLAX Take 17 g by mouth daily as needed for mild constipation.   umeclidinium bromide 62.5 MCG/ACT Aepb Commonly known as: INCRUSE ELLIPTA Inhale 1 puff into the lungs daily.       Allergies  Allergen Reactions   Desipramine Nausea Only and Rash      The results of significant diagnostics from this hospitalization (including imaging, microbiology, ancillary and laboratory) are listed below for reference.    Significant Diagnostic Studies: CT CORONARY MORPH W/CTA COR W/SCORE W/CA W/CM &/OR WO/CM  Addendum Date: 01/05/2023   ADDENDUM REPORT: 01/05/2023 22:50 EXAM: OVER-READ INTERPRETATION  CT CHEST The following report is an over-read performed by radiologist Dr. Alcide Clever of [facility] on 01/05/2023. This over-read does not include interpretation of cardiac or coronary anatomy or pathology. The coronary calcium score/coronary CTA interpretation by the cardiologist is attached. COMPARISON:  11/12/2022 FINDINGS: Cardiovascular: There are no significant extracardiac vascular findings. Mediastinum/Nodes: There are no enlarged lymph nodes within the visualized mediastinum. Lungs/Pleura: There is no pleural effusion. Mild left basilar atelectasis is noted. Upper abdomen: Visualized upper abdomen demonstrates some mild edematous changes adjacent to the stomach in the left upper quadrant. These are likely postoperative in nature related to the prior splenectomy. Musculoskeletal/Chest wall: No chest wall mass or suspicious osseous findings within  the visualized chest. IMPRESSION: Changes in the left upper quadrant likely related to recent splenectomy. Mild left basilar atelectasis is noted. Electronically Signed   By: Alcide Clever M.D.   On: 01/05/2023 22:50   Result Date: 01/05/2023 CLINICAL DATA:  52 year old with chest pain, inpatient. EXAM: Cardiac/Coronary  CTA TECHNIQUE: The patient was scanned on a [employer]. FINDINGS: A 100 kV prospective scan was triggered in the descending thoracic aorta at 111 HU's. Axial non-contrast 3 mm slices were carried out through the heart. The data set was analyzed on a dedicated work station and scored using the Agatson method. Gantry rotation speed was 250 msecs and collimation was .6 mm. 0.8 mg of sl NTG was given. The 3D data set was reconstructed in 5% intervals of the 67-82 % of the R-R cycle. Diastolic phases were analyzed on a dedicated work station using MPR, MIP and VRT modes. The patient received 80 cc of contrast. Image quality: Good, misregistration. Aorta:  Normal size.  No calcifications.  No dissection. Aortic Valve: No calcifications. Coronary Arteries:  Normal coronary origin.  Right dominance. RCA is a large dominant artery that gives rise to large PDA and PLA. There is mild calcified plaque proximal and distal, 0-24% stenosis. Left main is a large artery that gives rise to LAD and LCX arteries. LAD is a large vessel that has no  plaque. D1-3, moderate sized, no plaque. LCX is a non-dominant artery.  There is no plaque. OM1-moderate sized, no plaque OM2 and 3 are small caliber Other findings: Normal pulmonary vein drainage into the left atrium. Normal left atrial appendage without a thrombus. Normal size of the pulmonary artery. Please see radiology report for non cardiac findings. IMPRESSION: 1. Coronary calcium score of 35. This was 9 percentile for age and sex matched control. 2. Normal coronary origin with right dominance. 3. Mild calcified plaque in RCA non obstructive. Electronically  Signed: By: Donato Schultz M.D. On: 12/30/2022 14:41   DG Chest 2 View  Result Date: 12/28/2022 CLINICAL DATA:  Chest pain. EXAM: CHEST - 2 VIEW COMPARISON:  Chest radiograph dated 11/13/2022. FINDINGS: Left lung base atelectasis/scarring. No focal consolidation, pleural effusion, or pneumothorax. The cardiac silhouette is within normal limits. No acute osseous pathology. IMPRESSION: No active cardiopulmonary disease. Electronically Signed   By: Elgie Collard M.D.   On: 12/28/2022 17:20    Microbiology: No results found for this or any previous visit (from the past 240 hour(s)).   Labs: Basic Metabolic Panel: No results for input(s): "NA", "K", "CL", "CO2", "GLUCOSE", "BUN", "CREATININE", "CALCIUM", "MG", "PHOS" in the last 168 hours. Liver Function Tests: No results for input(s): "AST", "ALT", "ALKPHOS", "BILITOT", "PROT", "ALBUMIN" in the last 168 hours. No results for input(s): "LIPASE", "AMYLASE" in the last 168 hours. No results for input(s): "AMMONIA" in the last 168 hours. CBC: No results for input(s): "WBC", "NEUTROABS", "HGB", "HCT", "MCV", "PLT" in the last 168 hours. Cardiac Enzymes: No results for input(s): "CKTOTAL", "CKMB", "CKMBINDEX", "TROPONINI" in the last 168 hours. BNP: BNP (last 3 results) No results for input(s): "BNP" in the last 8760 hours.  ProBNP (last 3 results) No results for input(s): "PROBNP" in the last 8760 hours.  CBG: No results for input(s): "GLUCAP" in the last 168 hours.     Signed:  Zannie Cove MD.  [facility] 01/12/2023, 12:41 PM

## 2023-01-20 ENCOUNTER — Ambulatory Visit (INDEPENDENT_AMBULATORY_CARE_PROVIDER_SITE_OTHER): Payer: MEDICAID | Admitting: Gastroenterology

## 2023-01-20 ENCOUNTER — Encounter: Payer: Self-pay | Admitting: Gastroenterology

## 2023-01-20 VITALS — BP 106/78 | HR 85 | Ht 70.0 in | Wt 196.0 lb

## 2023-01-20 DIAGNOSIS — Z8719 Personal history of other diseases of the digestive system: Secondary | ICD-10-CM | POA: Diagnosis not present

## 2023-01-20 DIAGNOSIS — R748 Abnormal levels of other serum enzymes: Secondary | ICD-10-CM

## 2023-01-20 DIAGNOSIS — Z90410 Acquired total absence of pancreas: Secondary | ICD-10-CM | POA: Diagnosis not present

## 2023-01-20 DIAGNOSIS — K8681 Exocrine pancreatic insufficiency: Secondary | ICD-10-CM

## 2023-01-20 DIAGNOSIS — K8689 Other specified diseases of pancreas: Secondary | ICD-10-CM

## 2023-01-20 DIAGNOSIS — R634 Abnormal weight loss: Secondary | ICD-10-CM | POA: Diagnosis not present

## 2023-01-20 DIAGNOSIS — K769 Liver disease, unspecified: Secondary | ICD-10-CM

## 2023-01-20 MED ORDER — TRAMADOL HCL 50 MG PO TABS: 50.0000 mg | ORAL_TABLET | Freq: Three times a day (TID) | ORAL | 0 refills | Status: DC | PRN

## 2023-01-20 MED ORDER — PANCRELIPASE (LIP-PROT-AMYL) 36000-114000 UNITS PO CPEP: ORAL_CAPSULE | ORAL | 2 refills | Status: AC

## 2023-01-20 NOTE — Patient Instructions (Addendum)
We have sent the following medications to your pharmacy for you to pick up at your convenience: Tramadol , Creon   You will be contacted by [facility] in the next 2 days to arrange a CT -Pancreas Protocol.  The number on your caller ID will be [phone], please answer when they call.  If you have not heard from them in 2 days please call [phone] to schedule.     Increase your Creon to 3 caps with each meal and 1-2 capsules with snacks ( up too 2 snacks daily.)  _______________________________________________________  If your blood pressure at your visit was 140/90 or greater, please contact your primary care physician to follow up on this.  _______________________________________________________  If you are age 52 or older, your body mass index should be between 23-30. Your Body mass index is 28.12 kg/m. If this is out of the aforementioned range listed, please consider follow up with your Primary Care Provider.  If you are age 75 or younger, your body mass index should be between 19-25. Your Body mass index is 28.12 kg/m. If this is out of the aformentioned range listed, please consider follow up with your Primary Care Provider.   ________________________________________________________  The [facility] providers would like to encourage you to use [facility] to communicate with providers for non-urgent requests or questions.  Due to long hold times on the telephone, sending your provider a message by [facility] may be a faster and more efficient way to get a response.  Please allow 48 business hours for a response.  Please remember that this is for non-urgent requests.  _______________________________________________________  Thank you for choosing me and [facility].  Dr. Meridee Score

## 2023-01-20 NOTE — Progress Notes (Signed)
GASTROENTEROLOGY OUTPATIENT CLINIC VISIT   Primary Care Provider Delynn Flavin [place], DO [address] [place] [address] [phone]  Patient Profile: Steve Romero is a 52 y.o. 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  The patient presents to the [facility] for an evaluation and management of problem(s) noted below:  Problem List 1. Pancreatic duct disruption   2. History of pancreatitis   3. History of pancreatectomy   4. Unintentional weight loss   5. Exocrine pancreatic insufficiency   6. Liver lesion   7. Elevated alkaline phosphatase level     History of Present Illness Please see prior notes for full details of HPI.  Interval History The patient presents with his mom for follow-up today.  Since my last evaluation and treatment of this patient's recurring pseudocyst, the patient ended up having recurrence again.  He was seen by Dr. Freida Busman of pancreaticobiliary surgery and a distal pancreatectomy/splenectomy was performed.  Patient is seen her in follow-up and overall he was doing relatively well with plan for monitoring in the coming months.  He continues to have chronic abdominal discomfort that comes and goes and is a dull pain throughout the abdomen.  This is similar to the type of pain he was having before his surgery.  His weight has decreased somewhat and he is a bit concerned about that although he is eating well.  At times his bowels are loose other times his bowels are normal.  He has not noted any blood in his stools.  GI Review of Systems Positive as above Negative  for dysphagia, odynophagia, hematochezia, melena  Review of Systems General: Denies fevers/chills Cardiovascular: Denies chest pain Pulmonary: Denies shortness of breath Gastroenterological: See HPI Genitourinary: As per HPI Hematological: Denies easy bruising/bleeding Dermatological: Denies jaundice Psychological: Mood is stable   Medications Current Outpatient Medications  Medication Sig Dispense Refill   acetaminophen (TYLENOL) 500 MG tablet Take 2 tablets (1,000 mg total) by mouth every 8 (eight) hours as needed for mild pain. 30 tablet 0   albuterol (VENTOLIN HFA) 108 (90 Base) MCG/ACT inhaler Inhale 2 puffs into the lungs every 6 (six) hours as needed for wheezing or shortness of breath. 8 g 1   Continuous Blood Gluc Receiver (FREESTYLE LIBRE READER) DEVI 1 Units by Does not apply route daily. UAD to test BGs daily. Dx E11.9 1 each 1   Continuous Blood Gluc Sensor (FREESTYLE LIBRE 3 SENSOR) MISC Place 1 sensor on the skin every 14 days. Use to check glucose continuously e11.9 6 each 3   feeding supplement, GLUCERNA SHAKE, (GLUCERNA SHAKE) LIQD Take 237 mLs by mouth 2 (two) times daily between meals. (Patient taking differently: Take 237 mLs by mouth daily.) 10000 mL 0   insulin glargine (LANTUS SOLOSTAR) 100 UNIT/ML Solostar Pen Inject 26 Units into the skin daily. TO REPLACE TOUJEO (Patient taking differently: Inject 24 Units into the skin at bedtime. TO REPLACE TOUJEO) 15 mL 5   Insulin Pen Needle (BD PEN NEEDLE NANO U/F) 32G X 4 MM MISC UAD to administer insulin 100 each 3   traMADol (ULTRAM) 50 MG tablet Take 1 tablet (50 mg total) by mouth every 8 (eight) hours as needed. 60 tablet 0   lipase/protease/amylase (CREON) 36000 UNITS CPEP  capsule Take 3 capsules with meals and 1-2  capsule with snacks ( up too 2 snacks ) 570 capsule 2   No current facility-administered medications for this visit.    Allergies Allergies  Allergen Reactions   Desipramine Nausea Only and Rash     Histories Past Medical History:  Diagnosis Date   Acute pancreatitis without necrosis or infection, unspecified    AKI (acute kidney injury) (HCC) 05/17/2022   Anxiety 10/2014   Ascites    Bipolar disorder (HCC) age 33   COPD (chronic obstructive pulmonary disease) (HCC) 2016   Depression age 54   Enlarged prostate    Hyperlipidemia 2016   Hypertension 2013   Nausea vomiting and diarrhea 06/15/2022   Schizoaffective disorder (HCC) 11/13/2014   Sleep apnea    Does not wear c-pap, sleeps in sitting up position per pt   Thyroid disease 11/2014   Past Surgical History:  Procedure Laterality Date   BALLOON DILATION N/A 11/07/2020   Procedure: BALLOON DILATION;  Surgeon: Lemar Lofty., MD;  Location: [facility] ENDOSCOPY;  Service: Gastroenterology;  Laterality: N/A;   BALLOON DILATION N/A 06/26/2021   Procedure: BALLOON DILATION;  Surgeon: Meridee Score Netty Starring., MD;  Location: [facility] ENDOSCOPY;  Service: Gastroenterology;  Laterality: N/A;   BILIARY DILATION  07/24/2021   Procedure: BILIARY DILATION;  Surgeon: Meridee Score Netty Starring., MD;  Location: [facility] ENDOSCOPY;  Service: Gastroenterology;;   BILIARY DILATION  05/22/2022   Procedure: BILIARY DILATION;  Surgeon: Lemar Lofty., MD;  Location: [facility] ENDOSCOPY;  Service: Gastroenterology;;   BILIARY DILATION  06/16/2022   Procedure: GASTROSTOMY DILATION;  Surgeon: Lemar Lofty., MD;  Location: [facility] ENDOSCOPY;  Service: Gastroenterology;;   BILIARY STENT PLACEMENT N/A 10/10/2020   Procedure: BILIARY STENT PLACEMENT;  Surgeon: Malissa Hippo, MD;  Location: [facility];  Service: Endoscopy;  Laterality: N/A;   BILIARY STENT PLACEMENT  11/09/2020   Procedure: BILIARY STENT PLACEMENT;  Surgeon: Meridee Score Netty Starring., MD;  Location: [facility] ENDOSCOPY;  Service: Gastroenterology;;   BILIARY STENT PLACEMENT  11/07/2020   Procedure: BILIARY STENT PLACEMENT;  Surgeon: Lemar Lofty., MD;  Location: [facility] ENDOSCOPY;  Service:  Gastroenterology;;   BILIARY STENT PLACEMENT  11/11/2020   Procedure: BILIARY STENT PLACEMENT;  Surgeon: Lemar Lofty., MD;  Location: [facility] ENDOSCOPY;  Service: Gastroenterology;;   BILIARY STENT PLACEMENT  11/14/2020   Procedure: BILIARY STENT PLACEMENT;  Surgeon: Lemar Lofty., MD;  Location: [facility] ENDOSCOPY;  Service: Gastroenterology;;   BILIARY STENT PLACEMENT N/A 01/29/2021   Procedure: BILIARY STENT PLACEMENT;  Surgeon: Lemar Lofty., MD;  Location: [facility] ENDOSCOPY;  Service: Gastroenterology;  Laterality: N/A;   BILIARY STENT PLACEMENT  06/26/2021   Procedure: BILIARY STENT PLACEMENT;  Surgeon: Meridee Score Netty Starring., MD;  Location: [facility] ENDOSCOPY;  Service: Gastroenterology;;   BILIARY STENT PLACEMENT  07/24/2021   Procedure: BILIARY STENT PLACEMENT;  Surgeon: Lemar Lofty., MD;  Location: [facility] ENDOSCOPY;  Service: Gastroenterology;;   BIOPSY  11/07/2020   Procedure: BIOPSY;  Surgeon: Lemar Lofty., MD;  Location: [facility] ENDOSCOPY;  Service: Gastroenterology;;   BIOPSY  12/20/2020   Procedure: BIOPSY;  Surgeon: Lemar Lofty., MD;  Location: [facility] ENDOSCOPY;  Service: Gastroenterology;;   BIOPSY  06/26/2021   Procedure: BIOPSY;  Surgeon: Lemar Lofty., MD;  Location: [facility] ENDOSCOPY;  Service: Gastroenterology;;   CARPAL TUNNEL RELEASE Left 02/21/2016   Procedure: CARPAL TUNNEL RELEASE;  Surgeon: Vickki Hearing, MD;  Location: [facility];  Service: Orthopedics;  Laterality: Left;   CHOLECYSTECTOMY  N/A 10/09/2020   Procedure: LAPAROSCOPIC CHOLECYSTECTOMY;  Surgeon: Lucretia Roers, MD;  Location: [facility];  Service: General;  Laterality: N/A;   CYST ENTEROSTOMY N/A 11/07/2020   Procedure: CYST ENTEROSTOMY;  Surgeon: Lemar Lofty., MD;  Location: [facility] ENDOSCOPY;  Service: Gastroenterology;  Laterality: N/A;   CYST GASTROSTOMY  11/11/2020   Procedure: CYST NECROSECTOMY;  Surgeon: Meridee Score Netty Starring., MD;  Location: [facility]  ENDOSCOPY;  Service: Gastroenterology;;   CYST GASTROSTOMY  11/14/2020   Procedure: CYST NECROSECTOMY;  Surgeon: Lemar Lofty., MD;  Location: [facility] ENDOSCOPY;  Service: Gastroenterology;;   CYST GASTROSTOMY  06/16/2022   Procedure: CYST GASTROSTOMY;  Surgeon: Lemar Lofty., MD;  Location: [facility] ENDOSCOPY;  Service: Gastroenterology;;   CYST REMOVAL HAND     CYSTOSCOPY  06/26/2021   Procedure: CYSTOGASTROSTOMY;  Surgeon: Mansouraty, Netty Starring., MD;  Location: [facility] ENDOSCOPY;  Service: Gastroenterology;;   ENDOSCOPIC RETROGRADE CHOLANGIOPANCREATOGRAPHY (ERCP) WITH PROPOFOL N/A 11/09/2020   Procedure: ENDOSCOPIC RETROGRADE CHOLANGIOPANCREATOGRAPHY (ERCP) WITH PROPOFOL;  Surgeon: Lemar Lofty., MD;  Location: [facility] ENDOSCOPY;  Service: Gastroenterology;  Laterality: N/A;   ENDOSCOPIC RETROGRADE CHOLANGIOPANCREATOGRAPHY (ERCP) WITH PROPOFOL N/A 12/20/2020   Procedure: ENDOSCOPIC RETROGRADE CHOLANGIOPANCREATOGRAPHY (ERCP) WITH PROPOFOL;  Surgeon: Meridee Score Netty Starring., MD;  Location: [facility] ENDOSCOPY;  Service: Gastroenterology;  Laterality: N/A;   ENDOSCOPIC RETROGRADE CHOLANGIOPANCREATOGRAPHY (ERCP) WITH PROPOFOL N/A 01/29/2021   Procedure: ENDOSCOPIC RETROGRADE CHOLANGIOPANCREATOGRAPHY (ERCP) WITH PROPOFOL;  Surgeon: Meridee Score Netty Starring., MD;  Location: [facility] ENDOSCOPY;  Service: Gastroenterology;  Laterality: N/A;   ENDOSCOPIC RETROGRADE CHOLANGIOPANCREATOGRAPHY (ERCP) WITH PROPOFOL N/A 07/24/2021   Procedure: ENDOSCOPIC RETROGRADE CHOLANGIOPANCREATOGRAPHY (ERCP) WITH PROPOFOL;  Surgeon: Meridee Score Netty Starring., MD;  Location: [facility] ENDOSCOPY;  Service: Gastroenterology;  Laterality: N/A;   ERCP N/A 10/10/2020   Procedure: ENDOSCOPIC RETROGRADE CHOLANGIOPANCREATOGRAPHY (ERCP);  Surgeon: Malissa Hippo, MD;  Location: [facility];  Service: Endoscopy;  Laterality: N/A;   ERCP     ERCP N/A 05/22/2022   Procedure: ENDOSCOPIC RETROGRADE CHOLANGIOPANCREATOGRAPHY (ERCP);  Surgeon: Lemar Lofty., MD;  Location: [facility] ENDOSCOPY;  Service: Gastroenterology;  Laterality: N/A;   ESOPHAGOGASTRODUODENOSCOPY N/A 11/11/2020   Procedure: ESOPHAGOGASTRODUODENOSCOPY (EGD);  Surgeon: Lemar Lofty., MD;  Location: [facility] ENDOSCOPY;  Service: Gastroenterology;  Laterality: N/A;   ESOPHAGOGASTRODUODENOSCOPY N/A 11/14/2020   Procedure: ESOPHAGOGASTRODUODENOSCOPY (EGD);  Surgeon: Lemar Lofty., MD;  Location: [facility] ENDOSCOPY;  Service: Gastroenterology;  Laterality: N/A;   ESOPHAGOGASTRODUODENOSCOPY (EGD) WITH PROPOFOL N/A 11/09/2020   Procedure: ESOPHAGOGASTRODUODENOSCOPY (EGD) WITH PROPOFOL;  Surgeon: Meridee Score Netty Starring., MD;  Location: [facility] ENDOSCOPY;  Service: Gastroenterology;  Laterality: N/A;   ESOPHAGOGASTRODUODENOSCOPY (EGD) WITH PROPOFOL N/A 11/07/2020   Procedure: ESOPHAGOGASTRODUODENOSCOPY (EGD) WITH PROPOFOL;  Surgeon: Meridee Score Netty Starring., MD;  Location: [facility] ENDOSCOPY;  Service: Gastroenterology;  Laterality: N/A;   ESOPHAGOGASTRODUODENOSCOPY (EGD) WITH PROPOFOL N/A 12/20/2020   Procedure: ESOPHAGOGASTRODUODENOSCOPY (EGD) WITH PROPOFOL;  Surgeon: Meridee Score Netty Starring., MD;  Location: [facility] ENDOSCOPY;  Service: Gastroenterology;  Laterality: N/A;   ESOPHAGOGASTRODUODENOSCOPY (EGD) WITH PROPOFOL N/A 06/26/2021   Procedure: ESOPHAGOGASTRODUODENOSCOPY (EGD) WITH PROPOFOL;  Surgeon: Meridee Score Netty Starring., MD;  Location: [facility] ENDOSCOPY;  Service: Gastroenterology;  Laterality: N/A;   ESOPHAGOGASTRODUODENOSCOPY (EGD) WITH PROPOFOL N/A 07/24/2021   Procedure: ESOPHAGOGASTRODUODENOSCOPY (EGD) WITH PROPOFOL;  Surgeon: Meridee Score Netty Starring., MD;  Location: [facility] ENDOSCOPY;  Service: Gastroenterology;  Laterality: N/A;  AXIOS STENT   ESOPHAGOGASTRODUODENOSCOPY (EGD) WITH PROPOFOL N/A 05/22/2022   Procedure: ESOPHAGOGASTRODUODENOSCOPY (EGD) WITH PROPOFOL;  Surgeon: Meridee Score Netty Starring., MD;  Location: [facility] ENDOSCOPY;  Service: Gastroenterology;  Laterality: N/A;    ESOPHAGOGASTRODUODENOSCOPY (EGD)  WITH PROPOFOL N/A 06/16/2022   Procedure: ESOPHAGOGASTRODUODENOSCOPY (EGD) WITH PROPOFOL;  Surgeon: Meridee Score Netty Starring., MD;  Location: [facility] ENDOSCOPY;  Service: Gastroenterology;  Laterality: N/A;   ESOPHAGOGASTRODUODENOSCOPY (EGD) WITH PROPOFOL N/A 07/30/2022   Procedure: ESOPHAGOGASTRODUODENOSCOPY (EGD) WITH PROPOFOL;  Surgeon: Meridee Score Netty Starring., MD;  Location: [facility] ENDOSCOPY;  Service: Gastroenterology;  Laterality: N/A;   EUS  11/14/2020   Procedure: UPPER ENDOSCOPIC ULTRASOUND (EUS) LINEAR;  Surgeon: Lemar Lofty., MD;  Location: [facility] ENDOSCOPY;  Service: Gastroenterology;;   EUS N/A 06/26/2021   Procedure: UPPER ENDOSCOPIC ULTRASOUND (EUS) RADIAL;  Surgeon: Lemar Lofty., MD;  Location: [facility] ENDOSCOPY;  Service: Gastroenterology;  Laterality: N/A;   EUS N/A 05/22/2022   Procedure: UPPER ENDOSCOPIC ULTRASOUND (EUS) LINEAR;  Surgeon: Lemar Lofty., MD;  Location: [facility] ENDOSCOPY;  Service: Gastroenterology;  Laterality: N/A;   EUS N/A 06/16/2022   Procedure: UPPER ENDOSCOPIC ULTRASOUND (EUS) LINEAR;  Surgeon: Lemar Lofty., MD;  Location: [facility] ENDOSCOPY;  Service: Gastroenterology;  Laterality: N/A;   FINE NEEDLE ASPIRATION  05/22/2022   Procedure: FINE NEEDLE ASPIRATION;  Surgeon: Meridee Score Netty Starring., MD;  Location: [facility] ENDOSCOPY;  Service: Gastroenterology;;   GASTROINTESTINAL STENT REMOVAL  12/20/2020   Procedure: GASTROINTESTINAL STENT REMOVAL;  Surgeon: Lemar Lofty., MD;  Location: [facility] ENDOSCOPY;  Service: Gastroenterology;;  cyst gastrostomy stent and double pig tail stents x2 removed   HERNIA REPAIR Left 2002   groin   INGUINAL HERNIA REPAIR Left 04/05/2017   Procedure: RECURRENT HERNIA REPAIR INGUINAL ADULT WITH MESH;  Surgeon: Franky Macho, MD;  Location: [facility];  Service: General;  Laterality: Left;   MASS EXCISION Right 04/29/2020   Procedure: EXCISION MASS RIGHT WRIST;  Surgeon: Betha Loa, MD;   Location: [facility];  Service: Orthopedics;  Laterality: Right;   MASS EXCISION Right 10/09/2020   Procedure: EXCISION MASS, ABDOMINAL WALL, 2CM;  Surgeon: Lucretia Roers, MD;  Location: [facility];  Service: General;  Laterality: Right;   PANCREATIC STENT PLACEMENT  06/26/2021   Procedure: AXIOS STENT PLACEMENT;  Surgeon: Lemar Lofty., MD;  Location: [facility] ENDOSCOPY;  Service: Gastroenterology;;   PANCREATIC STENT PLACEMENT  06/16/2022   Procedure: PANCREATIC STENT PLACEMENT;  Surgeon: Lemar Lofty., MD;  Location: [facility] ENDOSCOPY;  Service: Gastroenterology;;   REMOVAL OF STONES  12/20/2020   Procedure: REMOVAL OF STONES;  Surgeon: Lemar Lofty., MD;  Location: [facility] ENDOSCOPY;  Service: Gastroenterology;;   REMOVAL OF STONES  07/24/2021   Procedure: REMOVAL OF STONES;  Surgeon: Lemar Lofty., MD;  Location: [facility] ENDOSCOPY;  Service: Gastroenterology;;   REMOVAL OF STONES  05/22/2022   Procedure: REMOVAL OF STONES;  Surgeon: Lemar Lofty., MD;  Location: [facility] ENDOSCOPY;  Service: Gastroenterology;;   Dennison Mascot N/A 10/10/2020   Procedure: Dennison Mascot;  Surgeon: Malissa Hippo, MD;  Location: [facility];  Service: Endoscopy;  Laterality: N/A;   SPHINCTEROTOMY  07/24/2021   Procedure: SPHINCTEROTOMY;  Surgeon: Mansouraty, Netty Starring., MD;  Location: [facility] ENDOSCOPY;  Service: Gastroenterology;;   SPLENECTOMY, TOTAL N/A 11/17/2022   Procedure: SPLENECTOMY;  Surgeon: Fritzi Mandes, MD;  Location: [facility] OR;  Service: General;  Laterality: N/A;   SPYGLASS CHOLANGIOSCOPY N/A 12/20/2020   Procedure: ZOXWRUEA CHOLANGIOSCOPY;  Surgeon: Lemar Lofty., MD;  Location: [facility] ENDOSCOPY;  Service: Gastroenterology;  Laterality: N/A;   SPYGLASS CHOLANGIOSCOPY N/A 07/24/2021   Procedure: SPYGLASS CHOLANGIOSCOPY;  Surgeon: Lemar Lofty., MD;  Location: [facility] ENDOSCOPY;  Service: Gastroenterology;  Laterality: N/A;   STENT REMOVAL  11/09/2020  Procedure: STENT REMOVAL;  Surgeon: Lemar Lofty., MD;  Location: [facility] ENDOSCOPY;  Service: Gastroenterology;;   Francine Graven REMOVAL  11/11/2020   Procedure: STENT REMOVAL;  Surgeon: Lemar Lofty., MD;  Location: [facility] ENDOSCOPY;  Service: Gastroenterology;;   Francine Graven REMOVAL  11/14/2020   Procedure: STENT REMOVAL;  Surgeon: Lemar Lofty., MD;  Location: [facility] ENDOSCOPY;  Service: Gastroenterology;;   Francine Graven REMOVAL  12/20/2020   Procedure: STENT REMOVAL;  Surgeon: Lemar Lofty., MD;  Location: [facility] ENDOSCOPY;  Service: Gastroenterology;;  biliary x2   STENT REMOVAL  07/24/2021   Procedure: AXIOS STENT REMOVAL;  Surgeon: Lemar Lofty., MD;  Location: [facility] ENDOSCOPY;  Service: Gastroenterology;;   Francine Graven REMOVAL  07/30/2022   Procedure: STENT REMOVAL;  Surgeon: Lemar Lofty., MD;  Location: [facility] ENDOSCOPY;  Service: Gastroenterology;;   UPPER ESOPHAGEAL ENDOSCOPIC ULTRASOUND (EUS) N/A 11/07/2020   Procedure: UPPER ESOPHAGEAL ENDOSCOPIC ULTRASOUND (EUS);  Surgeon: Lemar Lofty., MD;  Location: [facility] ENDOSCOPY;  Service: Gastroenterology;  Laterality: N/A;   UPPER GASTROINTESTINAL ENDOSCOPY     WOUND DEBRIDEMENT  11/09/2020   Procedure: CYST NECROSECTOMY;  Surgeon: Mansouraty, Netty Starring., MD;  Location: [facility] ENDOSCOPY;  Service: Gastroenterology;;   Social History   Socioeconomic History   Marital status: Single    Spouse name: Not on file   Number of children: 0   Years of education: Not on file   Highest education level: Not on file  Occupational History   Not on file  Tobacco Use   Smoking status: Every Day    Current packs/day: 0.25    Average packs/day: 0.3 packs/day for 26.0 years (6.5 ttl pk-yrs)    Types: Cigarettes   Smokeless tobacco: Never   Tobacco comments:    4-5 cig daily as of 10/07/2020.  Vaping Use   Vaping status: Never Used  Substance and Sexual Activity   Alcohol use: No   Drug use: No   Sexual activity: Never    Birth  control/protection: None  Other Topics Concern   Not on file  Social History Narrative   Not on file   Social Determinants of Health   Financial Resource Strain: Not on file  Food Insecurity: No Food Insecurity (11/16/2022)   Hunger Vital Sign    Worried About Running Out of Food in the Last Year: Never true    Ran Out of Food in the Last Year: Never true  Transportation Needs: No Transportation Needs (11/16/2022)   PRAPARE - Administrator, Civil Service (Medical): No    Lack of Transportation (Non-Medical): No  Physical Activity: Not on file  Stress: Not on file  Social Connections: Not on file  Intimate Partner Violence: Not At Risk (11/16/2022)   Humiliation, Afraid, Rape, and Kick questionnaire    Fear of Current or Ex-Partner: No    Emotionally Abused: No    Physically Abused: No    Sexually Abused: No   Family History  Problem Relation Age of Onset   Diabetes Father    Heart disease Father    Heart disease Maternal Grandmother    Stroke Maternal Grandfather    Colon cancer Neg Hx    Esophageal cancer Neg Hx    Inflammatory bowel disease Neg Hx    Liver disease Neg Hx    Pancreatic cancer Neg Hx    Rectal cancer Neg Hx    Stomach cancer Neg Hx    I have reviewed his medical, social, and family history in detail and  updated the electronic medical record as necessary.    PHYSICAL EXAMINATION  BP 106/78   Pulse 85   Ht 5\' 10"  (1.778 m)   Wt 196 lb (88.9 kg)   SpO2 94%   BMI 28.12 kg/m  Wt Readings from Last 3 Encounters:  01/20/23 196 lb (88.9 kg)  12/30/22 189 lb 9.5 oz (86 kg)  12/28/22 192 lb (87.1 kg)  GEN: NAD, appears stated age, doesn't appear chronically ill, mother with him PSYCH: Cooperative, without pressured speech EYE: Sclera anicteric ENT: MMM CV: Nontachycardic RESP: No audible wheezing GI: NABS, soft, protuberant abdomen, TTP in upper abdomen, no rebound, no guarding MSK/EXT: No significant lower extremity edema SKIN: No  jaundice NEURO:  Alert & Oriented x 3, no focal deficits   REVIEW OF DATA  I reviewed the following data at the time of this encounter:  GI Procedures and Studies  No new studies to review  Laboratory Studies  Reviewed those in epic  Imaging Studies  May 2024 CT abdomen pelvis IMPRESSION: 6.3 x 4.8 cm fluid collection is noted between the distal stomach and pancreas which is significantly enlarged compared to prior exam and most consistent with enlarging pancreatic pseudocyst. Mild inflammatory changes are noted involving the pancreatic body suggesting pancreatitis. Mild pancreatic ductal dilatation is noted in the tail which was present on prior exam. There is continued dilatation of the common bile duct which may be due to post cholecystectomy status, but potentially may be due to obstruction of the distal common bile duct secondary to the pseudo cyst and pancreatic inflammation. Stable complex low density is seen involving the left hepatic lobe and caudate lobe as noted on prior exams. It is uncertain if this represents focal inflammation or hepatic infarction as noted on prior exams.   ASSESSMENT  Mr. Treadaway is a 52 y.o. 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  The patient is seen today for evaluation and management of:  1. Pancreatic duct disruption   2. History of pancreatitis   3. History of pancreatectomy   4. Unintentional weight loss   5. Exocrine pancreatic insufficiency   6. Liver lesion   7. Elevated alkaline phosphatase level    The patient is stable clinically and hemodynamically.   Unfortunately his pancreatic duct disruption has been significant with recurrence of his pseudocyst with inability by multiple advanced endoscopist to traverse this area.  Thus the patient went for distal pancreatectomy and splenectomy and seems to have done well.  I will plan to repeat imaging in a few months to see how the abdominal cavity looks postprocedure.  For his unintentional weight loss he this may be a progression of his EPI now that he is having further pancreas removed.  Will plan to increase his Creon as outlined below.  He will monitor his weight closely.  The previously noted liver lesion is likely inflammation versus infarction on multiple scans but no evidence of malignancy.  Will need to continue to monitor his liver tests as he continues to have a slight elevation in his alkaline phosphatase still not at a dangerous elevation.  I greatly appreciate Dr. Eliot Ford assistance with this patient.  We did quite a bit to try to preserve his pancreas after his severe ERCP associated pancreatitis  from outside facility but sometimes we can only do as much as we can do without needing our surgical colleagues.  I think this was the definitive treatment that we will hopefully prevent him from having further issues.  All patient questions were answered to the best of my ability, and the patient agrees to the aforementioned plan of action with follow-up as indicated.   PLAN  Plan for CT abdomen pancreas protocol in 2 months Continue Creon - 3 capsules with each meal - 1 capsule with each snack Continue daily PPI Low-fat diet Tobacco cessation recommended again Tramadol 50 mg every 8 hours as needed -I am willing to keep this particular pain medication on board for patient if needed long-term will see how long he needs it Patient to follow-up with PCP in regards to elevated blood sugars as he may need to introduce mealtime insulin   Orders Placed This Encounter  Procedures   CT PANCREAS ABD W/WO     New Prescriptions   TRAMADOL (ULTRAM) 50 MG TABLET    Take 1 tablet (50 mg total) by mouth every 8 (eight) hours as needed.   Modified Medications   Modified Medication Previous Medication   LIPASE/PROTEASE/AMYLASE (CREON) 36000 UNITS CPEP CAPSULE lipase/protease/amylase (CREON) 36000 UNITS CPEP capsule      Take 3 capsules with meals and 1-2  capsule with snacks ( up too 2 snacks )    Take 36,000 Units by mouth. Take 2 capsules with meals and 1 capsule with snacks    Planned Follow Up No follow-ups on file.   Total Time in Face-to-Face and in Coordination of Care for patient including independent/personal interpretation/review of prior testing, medical history, examination, medication adjustment, communicating results with the patient directly, and documentation with the EHR is 25 minutes.   Corliss Parish, MD [facility] Office # [phone]

## 2023-01-23 ENCOUNTER — Other Ambulatory Visit (HOSPITAL_COMMUNITY): Payer: Self-pay

## 2023-01-23 ENCOUNTER — Encounter: Payer: Self-pay | Admitting: Gastroenterology

## 2023-01-23 ENCOUNTER — Telehealth: Payer: Self-pay | Admitting: Pharmacy Technician

## 2023-01-23 DIAGNOSIS — K769 Liver disease, unspecified: Secondary | ICD-10-CM | POA: Insufficient documentation

## 2023-01-23 DIAGNOSIS — Z90410 Acquired total absence of pancreas: Secondary | ICD-10-CM | POA: Insufficient documentation

## 2023-01-23 DIAGNOSIS — K8681 Exocrine pancreatic insufficiency: Secondary | ICD-10-CM | POA: Insufficient documentation

## 2023-01-23 NOTE — Telephone Encounter (Signed)
Pharmacy Patient Advocate Encounter   Received notification from CoverMyMeds that prior authorization for TRAMADOL 50MG  is required/requested.   Insurance verification completed.   The patient is insured through  [employer]  .   Per test claim: PA submitted to [facility] [employer] via CoverMyMeds Key/confirmation #/EOC [id number] Status is pending

## 2023-01-26 ENCOUNTER — Other Ambulatory Visit (HOSPITAL_COMMUNITY): Payer: Self-pay

## 2023-01-26 NOTE — Telephone Encounter (Signed)
Pharmacy Patient Advocate Encounter  Received notification from  [facility] [employer]  that Prior Authorization for traMADol HCl 50MG  tabletshas been APPROVED from 01-23-2023 to 07-26-2023.Marland Kitchen  PA #/Case ID/Reference #: [id number]

## 2023-01-27 ENCOUNTER — Telehealth: Payer: Self-pay | Admitting: Family Medicine

## 2023-01-28 NOTE — Telephone Encounter (Signed)
Patient return call. ?

## 2023-01-28 NOTE — Telephone Encounter (Signed)
Left message to call back  

## 2023-02-03 ENCOUNTER — Ambulatory Visit: Payer: MEDICAID | Attending: Internal Medicine | Admitting: Internal Medicine

## 2023-02-03 ENCOUNTER — Encounter: Payer: Self-pay | Admitting: Internal Medicine

## 2023-02-03 VITALS — BP 100/40 | HR 68 | Ht 70.0 in | Wt 194.2 lb

## 2023-02-03 DIAGNOSIS — R931 Abnormal findings on diagnostic imaging of heart and coronary circulation: Secondary | ICD-10-CM

## 2023-02-03 NOTE — Patient Instructions (Signed)
Medication Instructions:  Your physician recommends that you continue on your current medications as directed. Please refer to the Current Medication list given to you today.    Labwork: None  Testing/Procedures: None  Follow-Up: Your physician recommends that you schedule a follow-up appointment in: As needed.   Any Other Special Instructions Will Be Listed Below (If Applicable).     If you need a refill on your cardiac medications before your next appointment, please call your pharmacy.   

## 2023-02-04 ENCOUNTER — Telehealth: Payer: Self-pay

## 2023-02-04 DIAGNOSIS — R931 Abnormal findings on diagnostic imaging of heart and coronary circulation: Secondary | ICD-10-CM | POA: Insufficient documentation

## 2023-02-04 MED ORDER — ROSUVASTATIN CALCIUM 10 MG PO TABS: 10.0000 mg | ORAL_TABLET | Freq: Every day | ORAL | 1 refills | Status: DC

## 2023-02-04 NOTE — Telephone Encounter (Signed)
Called patient to inform him: Please start him on rosuvastatin 10 mg nightly. I am completing my note now. Call him and let him know please. Reason we are initiating statin is due to elevated LDL numbers. Side effects of statin include myalgias. If he experiences any muscle aches, he needs to be instructed to take the medication every other day. Schedule follow-up as needed.   Patient verbalized understanding and sent script in to [facility]

## 2023-02-04 NOTE — Progress Notes (Addendum)
Cardiology Office Note  Date: 02/04/2023   ID: Steve Romero, DOB [birth year], MRN [id number]  PCP:  Raliegh Ip, DO  Cardiologist:  Marjo Bicker, MD Electrophysiologist:  None    History of Present Illness: Steve Romero is a 52 y.o. male known to have HLD, DM 2, OSA not on CPAP was referred to cardiology for evaluation of elevated coronary artery calcium score.  Patient was admitted to [facility] in 6/24 with atypical chest pain and underwent CTA cardiac that showed elevated coronary calcium score of 35 with mild calcified RCA plaque, nonobstructive.  He continues to have chest pains.  No other symptoms of DOE, orthopnea, PND, leg swelling, palpitations or syncope.  He is not currently on any statins.  Lipid panel from 6/24 showed elevated LDL numbers, 120s.  Past Medical History:  Diagnosis Date   Acute pancreatitis without necrosis or infection, unspecified    AKI (acute kidney injury) (HCC) 05/17/2022   Anxiety 10/2014   Ascites    Bipolar disorder [facility]) age 72   COPD (chronic obstructive pulmonary disease) (HCC) 2016   Depression age 18   Enlarged prostate    Hyperlipidemia 2016   Hypertension 2013   Nausea vomiting and diarrhea 06/15/2022   Schizoaffective disorder (HCC) 11/13/2014   Sleep apnea    Does not wear c-pap, sleeps in sitting up position per pt   Thyroid disease 11/2014    Past Surgical History:  Procedure Laterality Date   BALLOON DILATION N/A 11/07/2020   Procedure: BALLOON DILATION;  Surgeon: Lemar Lofty., MD;  Location: [facility] ENDOSCOPY;  Service: Gastroenterology;  Laterality: N/A;   BALLOON DILATION N/A 06/26/2021   Procedure: BALLOON DILATION;  Surgeon: Meridee Score Netty Starring., MD;  Location: [facility] ENDOSCOPY;  Service: Gastroenterology;  Laterality: N/A;   BILIARY DILATION  07/24/2021   Procedure: BILIARY DILATION;  Surgeon: Meridee Score Netty Starring., MD;  Location: [facility] ENDOSCOPY;  Service: Gastroenterology;;   BILIARY  DILATION  05/22/2022   Procedure: BILIARY DILATION;  Surgeon: Lemar Lofty., MD;  Location: [facility] ENDOSCOPY;  Service: Gastroenterology;;   BILIARY DILATION  06/16/2022   Procedure: GASTROSTOMY DILATION;  Surgeon: Lemar Lofty., MD;  Location: [facility] ENDOSCOPY;  Service: Gastroenterology;;   BILIARY STENT PLACEMENT N/A 10/10/2020   Procedure: BILIARY STENT PLACEMENT;  Surgeon: Malissa Hippo, MD;  [facility];  Service: Endoscopy;  Laterality: N/A;   BILIARY STENT PLACEMENT  11/09/2020   Procedure: BILIARY STENT PLACEMENT;  Surgeon: Meridee Score Netty Starring., MD;  Location: [facility] ENDOSCOPY;  Service: Gastroenterology;;   BILIARY STENT PLACEMENT  11/07/2020   Procedure: BILIARY STENT PLACEMENT;  Surgeon: Lemar Lofty., MD;  Location: [facility] ENDOSCOPY;  Service: Gastroenterology;;   BILIARY STENT PLACEMENT  11/11/2020   Procedure: BILIARY STENT PLACEMENT;  Surgeon: Lemar Lofty., MD;  Location: [name] ENDOSCOPY;  Service: Gastroenterology;;   BILIARY STENT PLACEMENT  11/14/2020   Procedure: BILIARY STENT PLACEMENT;  Surgeon: Lemar Lofty., MD;  Location: [facility] ENDOSCOPY;  Service: Gastroenterology;;   BILIARY STENT PLACEMENT N/A 01/29/2021   Procedure: BILIARY STENT PLACEMENT;  Surgeon: Lemar Lofty., MD;  Location: [facility] ENDOSCOPY;  Service: Gastroenterology;  Laterality: N/A;   BILIARY STENT PLACEMENT  06/26/2021   Procedure: BILIARY STENT PLACEMENT;  Surgeon: Meridee Score Netty Starring., MD;  Location: [facility] ENDOSCOPY;  Service: Gastroenterology;;   BILIARY STENT PLACEMENT  07/24/2021   Procedure: BILIARY STENT PLACEMENT;  Surgeon: Lemar Lofty., MD;  Location: [facility] ENDOSCOPY;  Service: Gastroenterology;;   BIOPSY  11/07/2020  Procedure: BIOPSY;  Surgeon: Lemar Lofty., MD;  Location: [facility] ENDOSCOPY;  Service: Gastroenterology;;   BIOPSY  12/20/2020   Procedure: BIOPSY;  Surgeon: Lemar Lofty., MD;  Location: [facility] ENDOSCOPY;  Service:  Gastroenterology;;   BIOPSY  06/26/2021   Procedure: BIOPSY;  Surgeon: Lemar Lofty., MD;  Location: [facility] ENDOSCOPY;  Service: Gastroenterology;;   Fidela Salisbury RELEASE Left 02/21/2016   Procedure: CARPAL TUNNEL RELEASE;  Surgeon: Vickki Hearing, MD;  [facility];  Service: Orthopedics;  Laterality: Left;   CHOLECYSTECTOMY N/A 10/09/2020   Procedure: LAPAROSCOPIC CHOLECYSTECTOMY;  Surgeon: Lucretia Roers, MD;  Location[facility];  Service: General;  Laterality: N/A;   CYST ENTEROSTOMY N/A 11/07/2020   Procedure: CYST ENTEROSTOMY;  Surgeon: Lemar Lofty., MD;  Location: [facility] ENDOSCOPY;  Service: Gastroenterology;  Laterality: N/A;   CYST GASTROSTOMY  11/11/2020   Procedure: CYST NECROSECTOMY;  Surgeon: Meridee Score Netty Starring., MD;  Location: [facility] ENDOSCOPY;  Service: Gastroenterology;;   CYST GASTROSTOMY  11/14/2020   Procedure: CYST NECROSECTOMY;  Surgeon: Lemar Lofty., MD;  Location: [facility] ENDOSCOPY;  Service: Gastroenterology;;   CYST GASTROSTOMY  06/16/2022   Procedure: CYST GASTROSTOMY;  Surgeon: Lemar Lofty., MD;  Location: [facility] ENDOSCOPY;  Service: Gastroenterology;;   CYST REMOVAL HAND     CYSTOSCOPY  06/26/2021   Procedure: CYSTOGASTROSTOMY;  Surgeon: Mansouraty, Netty Starring., MD;  Location: [facility] ENDOSCOPY;  Service: Gastroenterology;;   ENDOSCOPIC RETROGRADE CHOLANGIOPANCREATOGRAPHY (ERCP) WITH PROPOFOL N/A 11/09/2020   Procedure: ENDOSCOPIC RETROGRADE CHOLANGIOPANCREATOGRAPHY (ERCP) WITH PROPOFOL;  Surgeon: Lemar Lofty., MD;  Location: [facility] ENDOSCOPY;  Service: Gastroenterology;  Laterality: N/A;   ENDOSCOPIC RETROGRADE CHOLANGIOPANCREATOGRAPHY (ERCP) WITH PROPOFOL N/A 12/20/2020   Procedure: ENDOSCOPIC RETROGRADE CHOLANGIOPANCREATOGRAPHY (ERCP) WITH PROPOFOL;  Surgeon: Meridee Score Netty Starring., MD;  Location: [facility] ENDOSCOPY;  Service: Gastroenterology;  Laterality: N/A;   ENDOSCOPIC RETROGRADE CHOLANGIOPANCREATOGRAPHY (ERCP) WITH PROPOFOL  N/A 01/29/2021   Procedure: ENDOSCOPIC RETROGRADE CHOLANGIOPANCREATOGRAPHY (ERCP) WITH PROPOFOL;  Surgeon: Meridee Score Netty Starring., MD;  Location: [facility] ENDOSCOPY;  Service: Gastroenterology;  Laterality: N/A;   ENDOSCOPIC RETROGRADE CHOLANGIOPANCREATOGRAPHY (ERCP) WITH PROPOFOL N/A 07/24/2021   Procedure: ENDOSCOPIC RETROGRADE CHOLANGIOPANCREATOGRAPHY (ERCP) WITH PROPOFOL;  Surgeon: Meridee Score Netty Starring., MD;  Location: [facility] ENDOSCOPY;  Service: Gastroenterology;  Laterality: N/A;   ERCP N/A 10/10/2020   Procedure: ENDOSCOPIC RETROGRADE CHOLANGIOPANCREATOGRAPHY (ERCP);  Surgeon: Malissa Hippo, MD;  [facility];  Service: Endoscopy;  Laterality: N/A;   ERCP     ERCP N/A 05/22/2022   Procedure: ENDOSCOPIC RETROGRADE CHOLANGIOPANCREATOGRAPHY (ERCP);  Surgeon: Lemar Lofty., MD;  Location: [facility] ENDOSCOPY;  Service: Gastroenterology;  Laterality: N/A;   ESOPHAGOGASTRODUODENOSCOPY N/A 11/11/2020   Procedure: ESOPHAGOGASTRODUODENOSCOPY (EGD);  Surgeon: Lemar Lofty., MD;  Location: [facility] ENDOSCOPY;  Service: Gastroenterology;  Laterality: N/A;   ESOPHAGOGASTRODUODENOSCOPY N/A 11/14/2020   Procedure: ESOPHAGOGASTRODUODENOSCOPY (EGD);  Surgeon: Lemar Lofty., MD;  Location: [facility] ENDOSCOPY;  Service: Gastroenterology;  Laterality: N/A;   ESOPHAGOGASTRODUODENOSCOPY (EGD) WITH PROPOFOL N/A 11/09/2020   Procedure: ESOPHAGOGASTRODUODENOSCOPY (EGD) WITH PROPOFOL;  Surgeon: Meridee Score Netty Starring., MD;  Location: [facility] ENDOSCOPY;  Service: Gastroenterology;  Laterality: N/A;   ESOPHAGOGASTRODUODENOSCOPY (EGD) WITH PROPOFOL N/A 11/07/2020   Procedure: ESOPHAGOGASTRODUODENOSCOPY (EGD) WITH PROPOFOL;  Surgeon: Meridee Score Netty Starring., MD;  Location: [facility] ENDOSCOPY;  Service: Gastroenterology;  Laterality: N/A;   ESOPHAGOGASTRODUODENOSCOPY (EGD) WITH PROPOFOL N/A 12/20/2020   Procedure: ESOPHAGOGASTRODUODENOSCOPY (EGD) WITH PROPOFOL;  Surgeon: Meridee Score Netty Starring., MD;  Location: [facility]  ENDOSCOPY;  Service: Gastroenterology;  Laterality: N/A;   ESOPHAGOGASTRODUODENOSCOPY (EGD) WITH PROPOFOL N/A 06/26/2021  Procedure: ESOPHAGOGASTRODUODENOSCOPY (EGD) WITH PROPOFOL;  Surgeon: Meridee Score Netty Starring., MD;  Location: [facility] ENDOSCOPY;  Service: Gastroenterology;  Laterality: N/A;   ESOPHAGOGASTRODUODENOSCOPY (EGD) WITH PROPOFOL N/A 07/24/2021   Procedure: ESOPHAGOGASTRODUODENOSCOPY (EGD) WITH PROPOFOL;  Surgeon: Meridee Score Netty Starring., MD;  Location: [facility] ENDOSCOPY;  Service: Gastroenterology;  Laterality: N/A;  AXIOS STENT   ESOPHAGOGASTRODUODENOSCOPY (EGD) WITH PROPOFOL N/A 05/22/2022   Procedure: ESOPHAGOGASTRODUODENOSCOPY (EGD) WITH PROPOFOL;  Surgeon: Meridee Score Netty Starring., MD;  [facility]OS[facility];  Service: Gastroenterology;  Laterality: N/A;   ESOPHAGOGASTRODUODENOSCOPY (EGD) WITH PROPOFOL N/A 06/16/2022   Procedure: ESOPHAGOGASTRODUODENOSCOPY (EGD) WITH PROPOFOL;  Surgeon: Meridee Score Netty Starring., MD;  [facility] ENDOSCOPY;  Service: Gastroenterology;  Laterality: N/A;   ESOPHAGOGASTRODUODENOSCOPY (EGD) WITH PROPOFOL N/A 07/30/2022   Procedure: ESOPHAGOGASTRODUODENOSCOPY (EGD) WITH PROPOFOL;  Surgeon: Meridee Score Netty Starring., MD;  Location[facility] ENDOS[facility];  Service: Gastroenterology;  Laterality: N/A;   EUS  11/14/2020   Procedure: UPPER ENDOSCOPIC ULTRASOUND (EUS) LINEAR;  Surgeon: Lemar Lofty., MD;  Location: [facility] ENDOSCOPY;  Service: Gastroenterology;;   EUS N/A 06/26/2021   Procedure: UPPER ENDOSCOPIC ULTRASOUND (EUS) RADIAL;  Surgeon: Lemar Lofty., MD;  Location: [facility] ENDOSCOPY;  Service: Gastroenterology;  Laterality: N/A;   EUS N/A 05/22/2022   Procedure: UPPER ENDOSCOPIC ULTRASOUND (EUS) LINEAR;  Surgeon: Lemar Lofty., MD;  Location: [facility];  Service: Gastroenterology;  Laterality: N/A;   EUS N/A 06/16/2022   Procedure: UPPER ENDOSCOPIC ULTRASOUND (EUS) LINEAR;  Surgeon: Lemar Lofty., MD;  Location[facility] ENDOS[facility];  Service:  Gastroenterology;  Laterality: N/A;   FINE NEEDLE ASPIRATION  05/22/2022   Procedure: FINE NEEDLE ASPIRATION;  Surgeon: Meridee Score Netty Starring., MD;  Location: [facility] ENDOSCOPY;  Service: Gastroenterology;;   GASTROINTESTINAL STENT REMOVAL  12/20/2020   Procedure: GASTROINTESTINAL STENT REMOVAL;  Surgeon: Lemar Lofty., MD;  Location: [facility] ENDOSCOPY;  Service: Gastroenterology;;  cyst gastrostomy stent and double pig tail stents x2 removed   HERNIA REPAIR Left 2002   groin   INGUINAL HERNIA REPAIR Left 04/05/2017   Procedure: RECURRENT HERNIA REPAIR INGUINAL ADULT WITH MESH;  Surgeon: Franky Macho, MD;  Location[facility];  Service: General;  Laterality: Left;   MASS EXCISION Right 04/29/2020   Procedure: EXCISION MASS RIGHT WRIST;  Surgeon: Betha Loa, MD;  Location: [facility];  Service: Orthopedics;  Laterality: Right;   MASS EXCISION Right 10/09/2020   Procedure: EXCISION MASS, ABDOMINAL WALL, 2CM;  Surgeon: Lucretia Roers, MD;  Location: [facility];  Service: General;  Laterality: Right;   PANCREATIC STENT PLACEMENT  06/26/2021   Procedure: AXIOS STENT PLACEMENT;  Surgeon: Lemar Lofty., MD;  Location: [facility] ENDOSCOPY;  Service: Gastroenterology;;   PANCREATIC STENT PLACEMENT  06/16/2022   Procedure: PANCREATIC STENT PLACEMENT;  Surgeon: Lemar Lofty., MD;  Location: [facility] ENDOSCOPY;  Service: Gastroenterology;;   REMOVAL OF STONES  12/20/2020   Procedure: REMOVAL OF STONES;  Surgeon: Lemar Lofty., MD;  Location: [facility] ENDOSCOPY;  Service: Gastroenterology;;   REMOVAL OF STONES  07/24/2021   Procedure: REMOVAL OF STONES;  Surgeon: Lemar Lofty., MD;  Location: [facility] ENDOSCOPY;  Service: Gastroenterology;;   REMOVAL OF STONES  05/22/2022   Procedure: REMOVAL OF STONES;  Surgeon: Lemar Lofty., MD;  Location: [facility] ENDOSCOPY;  Service: Gastroenterology;;   Dennison Mascot N/A 10/10/2020   Procedure: Dennison Mascot;  Surgeon: Malissa Hippo, MD;  [facility];  Service: Endoscopy;  Laterality: N/A;   SPHINCTEROTOMY  07/24/2021   Procedure: SPHINCTEROTOMY;  Surgeon: Mansouraty, Netty Starring., MD;  Location: [facility]  ENDOSCOPY;  Service: Gastroenterology;;   SPLENECTOMY, TOTAL N/A 11/17/2022   Procedure: SPLENECTOMY;  Surgeon: Fritzi Mandes, MD;  Location: [facility] OR;  Service: General;  Laterality: N/A;   SPYGLASS CHOLANGIOSCOPY N/A 12/20/2020   Procedure: HQIONGEX CHOLANGIOSCOPY;  Surgeon: Lemar Lofty., MD;  Location[facility] END[facility];  Service: Gastroenterology;  Laterality: N/A;   SPYGLASS CHOLANGIOSCOPY N/A 07/24/2021   Procedure: SPYGLASS CHOLANGIOSCOPY;  Surgeon: Lemar Lofty., MD;  Location: [facility] ENDOSCOPY;  Service: Gastroenterology;  Laterality: N/A;   STENT REMOVAL  11/09/2020   Procedure: STENT REMOVAL;  Surgeon: Lemar Lofty., MD;  Location: [facility] ENDOSCOPY;  Service: Gastroenterology;;   Francine Graven REMOVAL  11/11/2020   Procedure: STENT REMOVAL;  Surgeon: Lemar Lofty., MD;  Location: [facility] ENDOSCOPY;  Service: Gastroenterology;;   Francine Graven REMOVAL  11/14/2020   Procedure: STENT REMOVAL;  Surgeon: Lemar Lofty., MD;  Location: [facility] ENDOSCOPY;  Service: Gastroenterology;;   Francine Graven REMOVAL  12/20/2020   Procedure: STENT REMOVAL;  Surgeon: Lemar Lofty., MD;  Location: [facility] ENDOSCOPY;  Service: Gastroenterology;;  biliary x2   STENT REMOVAL  07/24/2021   Procedure: AXIOS STENT REMOVAL;  Surgeon: Lemar Lofty., MD;  Location: [facility] ENDOSCOPY;  Service: Gastroenterology;;   Francine Graven REMOVAL  07/30/2022   Procedure: STENT REMOVAL;  Surgeon: Lemar Lofty., MD;  Location[facility];  Service: Gastroenterology;;   UPPER ESOPHAGEAL ENDOSCOPIC ULTRASOUND (EUS) N/A 11/07/2020   Procedure: UPPER ESOPHAGEAL ENDOSCOPIC ULTRASOUND (EUS);  Surgeon: Lemar Lofty., MD;  Location: [facility] ENDOSCOPY;  Service: Gastroenterology;  Laterality: N/A;   UPPER GASTROINTESTINAL ENDOSCOPY      WOUND DEBRIDEMENT  11/09/2020   Procedure: CYST NECROSECTOMY;  Surgeon: Lemar Lofty., MD;  Location: [facility] ENDOSCOPY;  Service: Gastroenterology;;    Current Outpatient Medications  Medication Sig Dispense Refill   acetaminophen (TYLENOL) 500 MG tablet Take 2 tablets (1,000 mg total) by mouth every 8 (eight) hours as needed for mild pain. 30 tablet 0   albuterol (VENTOLIN HFA) 108 (90 Base) MCG/ACT inhaler Inhale 2 puffs into the lungs every 6 (six) hours as needed for wheezing or shortness of breath. 8 g 1   Continuous Blood Gluc Receiver (FREESTYLE LIBRE READER) DEVI 1 Units by Does not apply route daily. UAD to test BGs daily. Dx E11.9 1 each 1   Continuous Blood Gluc Sensor (FREESTYLE LIBRE 3 SENSOR) MISC Place 1 sensor on the skin every 14 days. Use to check glucose continuously e11.9 6 each 3   insulin glargine (LANTUS SOLOSTAR) 100 UNIT/ML Solostar Pen Inject 26 Units into the skin daily. TO REPLACE TOUJEO (Patient taking differently: Inject 24 Units into the skin at bedtime. TO REPLACE TOUJEO) 15 mL 5   Insulin Pen Needle (BD PEN NEEDLE NANO U/F) 32G X 4 MM MISC UAD to administer insulin 100 each 3   lipase/protease/amylase (CREON) 36000 UNITS CPEP capsule Take 3 capsules with meals and 1-2  capsule with snacks ( up too 2 snacks ) 570 capsule 2   traMADol (ULTRAM) 50 MG tablet Take 1 tablet (50 mg total) by mouth every 8 (eight) hours as needed. 60 tablet 0   No current facility-administered medications for this visit.   Allergies:  Desipramine   Social History: The patient  reports that he has been smoking cigarettes. He has a 6.5 pack-year smoking history. He has never used smokeless tobacco. He reports that he does not drink alcohol and does not use drugs.   Family History: The patient's family history includes Diabetes in his  father; Heart disease in his father and maternal grandmother; Stroke in his maternal grandfather.   ROS:  Please see the history of present illness.  Otherwise, complete review of systems is positive for none  All other systems are reviewed and negative.   Physical Exam: VS:  BP (!) 100/40 (BP Location: Right Arm)   Pulse 68   Ht 5\' 10"  (1.778 m)   Wt 194 lb 3.2 oz (88.1 kg)   SpO2 97%   BMI 27.86 kg/m , BMI Body mass index is 27.86 kg/m.  Wt Readings from Last 3 Encounters:  02/03/23 194 lb 3.2 oz (88.1 kg)  01/20/23 196 lb (88.9 kg)  12/30/22 189 lb 9.5 oz (86 kg)    General: Patient appears comfortable at rest. HEENT: Conjunctiva and lids normal, oropharynx clear with moist mucosa. Neck: Supple, no elevated JVP or carotid bruits, no thyromegaly. Lungs: Clear to auscultation, nonlabored breathing at rest. Cardiac: Regular rate and rhythm, no S3 or significant systolic murmur, no pericardial rub. Abdomen: Soft, nontender, no hepatomegaly, bowel sounds present, no guarding or rebound. Extremities: No pitting edema, distal pulses 2+. Skin: Warm and dry. Musculoskeletal: No kyphosis. Neuropsychiatric: Alert and oriented x3, affect grossly appropriate.  Recent Labwork: 11/22/2022: Magnesium 1.8 12/30/2022: ALT 12; AST 12; BUN 15; Creatinine, Ser 0.94; Hemoglobin 12.1; Platelets 736; Potassium 4.2; Sodium 133     Component Value Date/Time   CHOL 169 12/29/2022 0650   CHOL 218 (H) 10/04/2019 1433   TRIG 98 12/29/2022 0650   HDL 25 (L) 12/29/2022 0650   HDL 27 (L) 10/04/2019 1433   CHOLHDL 6.8 12/29/2022 0650   VLDL 20 12/29/2022 0650   LDLCALC 124 (H) 12/29/2022 0650   LDLCALC 151 (H) 10/04/2019 1433    Assessment and Plan:  # Elevated coronary calcium score 35 # Hyperlipidemia -CT cardiac showed coronary calcium score of 35 (77th percentile for age and sex matched control) with mild calcified plaque in the RCA, nonobstructive CAD. Moderate intensity statin can be initiated after 52 years of age however due to hyperlipidemia with LDL 124 (elevated) in 6/24, it is not unreasonable to initiate rosuvastatin 10 mg nightly.  Goal LDL less than 100. -Echocardiogram in 02/2021 showed normal LVEF, subvalvular LVOT acceleration likely secondary to hyperdynamic LV and no evidence of valvular heart disease was noted.   I have spent a total of 30 minutes with patient reviewing chart, EKGs, labs and examining patient as well as establishing an assessment and plan that was discussed with the patient.  > 50% of time was spent in direct patient care.    Medication Adjustments/Labs and Tests Ordered: Current medicines are reviewed at length with the patient today.  Concerns regarding medicines are outlined above.   Tests Ordered: No orders of the defined types were placed in this encounter.   Medication Changes: No orders of the defined types were placed in this encounter.   Disposition:  Follow up prn  Signed Corine Solorio Verne Spurr, MD, 02/04/2023 12:55 PM    [facility] at [facility] [address] [place], [place], [place] [address]

## 2023-02-09 ENCOUNTER — Other Ambulatory Visit (HOSPITAL_COMMUNITY): Payer: Self-pay

## 2023-02-09 ENCOUNTER — Telehealth: Payer: Self-pay

## 2023-02-09 NOTE — Telephone Encounter (Signed)
Pharmacy Patient Advocate Encounter   Received notification from CoverMyMeds that prior authorization for [facility] 3 reader is required/requested.   Insurance verification completed.   The patient is insured through [place] .   Per test claim:  Dexcom G6 transmitter/receiver or Dexcom G7 receiver and the sensors is preferred by the ins.  If suggested medication is appropriate, Please send in a new RX and discontinue this one. If not, please advise as to why it's not appropriate so that we may request a Prior Authorization.

## 2023-02-12 ENCOUNTER — Ambulatory Visit (HOSPITAL_COMMUNITY)
Admission: RE | Admit: 2023-02-12 | Discharge: 2023-02-12 | Disposition: A | Discharge status: Home / Self Care | Payer: MEDICAID | Source: Ambulatory Visit | Attending: Gastroenterology | Admitting: Gastroenterology

## 2023-02-12 DIAGNOSIS — R634 Abnormal weight loss: Secondary | ICD-10-CM | POA: Diagnosis present

## 2023-02-12 DIAGNOSIS — K8689 Other specified diseases of pancreas: Secondary | ICD-10-CM | POA: Diagnosis not present

## 2023-02-12 DIAGNOSIS — Z794 Long term (current) use of insulin: Secondary | ICD-10-CM | POA: Diagnosis present

## 2023-02-12 DIAGNOSIS — Z8719 Personal history of other diseases of the digestive system: Secondary | ICD-10-CM | POA: Diagnosis present

## 2023-02-12 DIAGNOSIS — E119 Type 2 diabetes mellitus without complications: Secondary | ICD-10-CM | POA: Insufficient documentation

## 2023-02-12 DIAGNOSIS — Z90410 Acquired total absence of pancreas: Secondary | ICD-10-CM | POA: Insufficient documentation

## 2023-02-12 LAB — POCT I-STAT CREATININE: Creatinine, Ser: 1 mg/dL (ref 0.61–1.24)

## 2023-02-12 MED ORDER — SODIUM CHLORIDE (PF) 0.9 % IJ SOLN
INTRAMUSCULAR | Status: AC
  Filled 2023-02-12: qty 50

## 2023-02-12 MED ORDER — IOHEXOL 300 MG/ML  SOLN
100.0000 mL | Freq: Once | INTRAMUSCULAR | Status: AC | PRN
  Administered 2023-02-12: 100 mL via INTRAVENOUS

## 2023-03-02 ENCOUNTER — Ambulatory Visit (INDEPENDENT_AMBULATORY_CARE_PROVIDER_SITE_OTHER): Payer: MEDICAID

## 2023-03-02 DIAGNOSIS — E1165 Type 2 diabetes mellitus with hyperglycemia: Secondary | ICD-10-CM | POA: Diagnosis not present

## 2023-03-02 LAB — HM DIABETES EYE EXAM

## 2023-03-02 NOTE — Progress Notes (Signed)
Steve Romero arrived 03/02/2023 and has given verbal consent to obtain images and complete their overdue diabetic retinal screening.  The images have been sent to an ophthalmologist or optometrist for review and interpretation.  Results will be sent back to Raliegh Ip, DO for review.  Patient has been informed they will be contacted when we receive the results via telephone or MyChart

## 2023-03-10 ENCOUNTER — Telehealth: Payer: Self-pay | Admitting: Gastroenterology

## 2023-03-10 NOTE — Telephone Encounter (Signed)
Inbound call from patient wanting to let Dr. Sharol Roussel know that his weight loss is now down to 183. Please advise.

## 2023-03-11 DIAGNOSIS — R634 Abnormal weight loss: Secondary | ICD-10-CM

## 2023-03-11 NOTE — Telephone Encounter (Signed)
The pt has been advised of the information via My Chart- pt does view messages.

## 2023-03-11 NOTE — Telephone Encounter (Signed)
The pt called to inform Dr Meridee Score that his weight is now 183 lbs.  FYI

## 2023-03-11 NOTE — Telephone Encounter (Signed)
Patty, Thank you for this update. If he continues to have weight loss and gets under 180 pounds, then he needs a CT chest for further workup of unintentional weight loss. Please tell him to give [place] a call or update in the next 1 to 2 weeks. Thanks. GM

## 2023-03-19 ENCOUNTER — Other Ambulatory Visit (HOSPITAL_COMMUNITY): Payer: Self-pay

## 2023-03-19 NOTE — Telephone Encounter (Signed)
Pharmacy Patient Advocate Encounter   Received notification from CoverMyMeds that prior authorization for [facility] 3 reader is required/requested.   Insurance verification completed.   The patient is insured through  [employer]  .   Per test claim: PA required; PA submitted to [employer] via CoverMyMeds Key/confirmation #/EOC [id number] Status is pending   Per test claim: [employer] 3 sensors copay $0.

## 2023-03-24 NOTE — Telephone Encounter (Signed)
Pharmacy Patient Advocate Encounter  Received notification from [facility] [employer] that Prior Authorization for [employer] 3 Readerhas been APPROVED from 03/20/23 to 09/16/23   PA #/Case ID/Reference #: [id number]

## 2023-03-25 NOTE — Telephone Encounter (Signed)
Dr Meridee Score see pt weight as of today. Do you want me to order a CT of the chest?

## 2023-03-25 NOTE — Telephone Encounter (Signed)
Move forward with CT scan chest (IV contrast) for continued unintentional weight loss. GM

## 2023-03-25 NOTE — Addendum Note (Signed)
Addended by: Loretha Stapler on: 03/25/2023 04:35 PM   Modules accepted: Orders

## 2023-03-28 ENCOUNTER — Inpatient Hospital Stay (HOSPITAL_COMMUNITY)
Admission: EM | Admit: 2023-03-28 | Discharge: 2023-03-31 | DRG: 439 | Disposition: A | Discharge status: Home / Self Care | Payer: MEDICAID | Attending: Internal Medicine | Admitting: Internal Medicine

## 2023-03-28 ENCOUNTER — Encounter (HOSPITAL_COMMUNITY): Payer: Self-pay

## 2023-03-28 ENCOUNTER — Emergency Department (HOSPITAL_COMMUNITY): Payer: MEDICAID

## 2023-03-28 ENCOUNTER — Other Ambulatory Visit: Payer: Self-pay

## 2023-03-28 DIAGNOSIS — K859 Acute pancreatitis without necrosis or infection, unspecified: Principal | ICD-10-CM | POA: Diagnosis present

## 2023-03-28 DIAGNOSIS — Z8249 Family history of ischemic heart disease and other diseases of the circulatory system: Secondary | ICD-10-CM

## 2023-03-28 DIAGNOSIS — E1165 Type 2 diabetes mellitus with hyperglycemia: Secondary | ICD-10-CM | POA: Diagnosis present

## 2023-03-28 DIAGNOSIS — F17210 Nicotine dependence, cigarettes, uncomplicated: Secondary | ICD-10-CM | POA: Diagnosis present

## 2023-03-28 DIAGNOSIS — Z79899 Other long term (current) drug therapy: Secondary | ICD-10-CM

## 2023-03-28 DIAGNOSIS — E1159 Type 2 diabetes mellitus with other circulatory complications: Secondary | ICD-10-CM | POA: Diagnosis present

## 2023-03-28 DIAGNOSIS — Z794 Long term (current) use of insulin: Secondary | ICD-10-CM

## 2023-03-28 DIAGNOSIS — K861 Other chronic pancreatitis: Secondary | ICD-10-CM | POA: Diagnosis present

## 2023-03-28 DIAGNOSIS — J449 Chronic obstructive pulmonary disease, unspecified: Secondary | ICD-10-CM | POA: Diagnosis present

## 2023-03-28 DIAGNOSIS — Z8711 Personal history of peptic ulcer disease: Secondary | ICD-10-CM

## 2023-03-28 DIAGNOSIS — N401 Enlarged prostate with lower urinary tract symptoms: Secondary | ICD-10-CM | POA: Diagnosis present

## 2023-03-28 DIAGNOSIS — Z90411 Acquired partial absence of pancreas: Secondary | ICD-10-CM

## 2023-03-28 DIAGNOSIS — F419 Anxiety disorder, unspecified: Secondary | ICD-10-CM | POA: Diagnosis present

## 2023-03-28 DIAGNOSIS — E1169 Type 2 diabetes mellitus with other specified complication: Secondary | ICD-10-CM | POA: Diagnosis present

## 2023-03-28 DIAGNOSIS — E785 Hyperlipidemia, unspecified: Secondary | ICD-10-CM | POA: Diagnosis present

## 2023-03-28 DIAGNOSIS — F17200 Nicotine dependence, unspecified, uncomplicated: Secondary | ICD-10-CM | POA: Diagnosis present

## 2023-03-28 DIAGNOSIS — K863 Pseudocyst of pancreas: Secondary | ICD-10-CM | POA: Diagnosis present

## 2023-03-28 DIAGNOSIS — R748 Abnormal levels of other serum enzymes: Secondary | ICD-10-CM | POA: Diagnosis present

## 2023-03-28 DIAGNOSIS — Z9081 Acquired absence of spleen: Secondary | ICD-10-CM

## 2023-03-28 DIAGNOSIS — Z9049 Acquired absence of other specified parts of digestive tract: Secondary | ICD-10-CM

## 2023-03-28 DIAGNOSIS — K831 Obstruction of bile duct: Secondary | ICD-10-CM | POA: Diagnosis present

## 2023-03-28 DIAGNOSIS — G4730 Sleep apnea, unspecified: Secondary | ICD-10-CM | POA: Diagnosis present

## 2023-03-28 DIAGNOSIS — D75839 Thrombocytosis, unspecified: Secondary | ICD-10-CM | POA: Diagnosis present

## 2023-03-28 DIAGNOSIS — Z86010 Personal history of colonic polyps: Secondary | ICD-10-CM

## 2023-03-28 DIAGNOSIS — Z888 Allergy status to other drugs, medicaments and biological substances status: Secondary | ICD-10-CM

## 2023-03-28 DIAGNOSIS — Z90410 Acquired total absence of pancreas: Secondary | ICD-10-CM

## 2023-03-28 DIAGNOSIS — Z833 Family history of diabetes mellitus: Secondary | ICD-10-CM

## 2023-03-28 DIAGNOSIS — K8681 Exocrine pancreatic insufficiency: Secondary | ICD-10-CM | POA: Diagnosis present

## 2023-03-28 DIAGNOSIS — I10 Essential (primary) hypertension: Secondary | ICD-10-CM | POA: Diagnosis present

## 2023-03-28 LAB — URINALYSIS, ROUTINE W REFLEX MICROSCOPIC
Bacteria, UA: NONE SEEN
Bilirubin Urine: NEGATIVE
Glucose, UA: >=500 mg/dL — AB
Hgb urine dipstick: NEGATIVE
Ketones, ur: 20 mg/dL — AB
Leukocytes,Ua: NEGATIVE
Nitrite: NEGATIVE
Protein, ur: NEGATIVE mg/dL
Specific Gravity, Urine: 1.032 — ABNORMAL HIGH (ref 1.005–1.030)
pH: 5 (ref 5.0–8.0)

## 2023-03-28 LAB — COMPREHENSIVE METABOLIC PANEL
ALT: 59 U/L — ABNORMAL HIGH (ref 0–44)
AST: 20 U/L (ref 15–41)
Albumin: 4 g/dL (ref 3.5–5.0)
Alkaline Phosphatase: 456 U/L — ABNORMAL HIGH (ref 38–126)
Anion gap: 12 (ref 5–15)
BUN: 11 mg/dL (ref 6–20)
CO2: 24 mmol/L (ref 22–32)
Calcium: 9.3 mg/dL (ref 8.9–10.3)
Chloride: 96 mmol/L — ABNORMAL LOW (ref 98–111)
Creatinine, Ser: 0.97 mg/dL (ref 0.61–1.24)
GFR, Estimated: >60 mL/min (ref >60)
Glucose, Bld: 473 mg/dL — ABNORMAL HIGH (ref 70–99)
Potassium: 4.3 mmol/L (ref 3.5–5.1)
Sodium: 132 mmol/L — ABNORMAL LOW (ref 135–145)
Total Bilirubin: 0.7 mg/dL (ref 0.3–1.2)
Total Protein: 7.3 g/dL (ref 6.5–8.1)

## 2023-03-28 LAB — CBC WITH DIFFERENTIAL/PLATELET
Abs Immature Granulocytes: 0.05 10*3/uL (ref 0.00–0.07)
Basophils Absolute: 0.1 10*3/uL (ref 0.0–0.1)
Basophils Relative: 1 %
Eosinophils Absolute: 0.1 10*3/uL (ref 0.0–0.5)
Eosinophils Relative: 1 %
HCT: 48.4 % (ref 39.0–52.0)
Hemoglobin: 16.2 g/dL (ref 13.0–17.0)
Immature Granulocytes: 1 %
Lymphocytes Relative: 25 %
Lymphs Abs: 2.5 10*3/uL (ref 0.7–4.0)
MCH: 27.9 pg (ref 26.0–34.0)
MCHC: 33.5 g/dL (ref 30.0–36.0)
MCV: 83.3 fL (ref 80.0–100.0)
Monocytes Absolute: 0.7 10*3/uL (ref 0.1–1.0)
Monocytes Relative: 7 %
Neutro Abs: 6.6 10*3/uL (ref 1.7–7.7)
Neutrophils Relative %: 65 %
Platelets: 453 10*3/uL — ABNORMAL HIGH (ref 150–400)
RBC: 5.81 MIL/uL (ref 4.22–5.81)
RDW: 14.6 % (ref 11.5–15.5)
WBC: 10.1 10*3/uL (ref 4.0–10.5)
nRBC: 0 % (ref 0.0–0.2)

## 2023-03-28 LAB — LIPASE, BLOOD: Lipase: 99 U/L — ABNORMAL HIGH (ref 11–51)

## 2023-03-28 LAB — GLUCOSE, CAPILLARY: Glucose-Capillary: 262 mg/dL — ABNORMAL HIGH (ref 70–99)

## 2023-03-28 MED ORDER — MORPHINE SULFATE (PF) 4 MG/ML IV SOLN
4.0000 mg | Freq: Once | INTRAVENOUS | Status: AC
  Administered 2023-03-28: 4 mg via INTRAVENOUS
  Filled 2023-03-28: qty 1

## 2023-03-28 MED ORDER — HYDROMORPHONE HCL 1 MG/ML IJ SOLN
1.0000 mg | INTRAMUSCULAR | Status: DC | PRN
  Administered 2023-03-28 – 2023-03-31 (×13): 1 mg via INTRAVENOUS
  Filled 2023-03-28 (×13): qty 1

## 2023-03-28 MED ORDER — PANTOPRAZOLE SODIUM 40 MG IV SOLR
40.0000 mg | INTRAVENOUS | Status: DC
  Administered 2023-03-28 – 2023-03-30 (×3): 40 mg via INTRAVENOUS
  Filled 2023-03-28 (×3): qty 10

## 2023-03-28 MED ORDER — HYDROMORPHONE HCL 1 MG/ML IJ SOLN
1.0000 mg | Freq: Once | INTRAMUSCULAR | Status: AC
  Administered 2023-03-28: 1 mg via INTRAVENOUS
  Filled 2023-03-28: qty 1

## 2023-03-28 MED ORDER — SODIUM CHLORIDE 0.9 % IV BOLUS
2000.0000 mL | Freq: Once | INTRAVENOUS | Status: AC
  Administered 2023-03-28: 2000 mL via INTRAVENOUS

## 2023-03-28 MED ORDER — ACETAMINOPHEN 650 MG RE SUPP: 650.0000 mg | Freq: Four times a day (QID) | RECTAL | Status: DC | PRN

## 2023-03-28 MED ORDER — INSULIN GLARGINE-YFGN 100 UNIT/ML ~~LOC~~ SOLN
15.0000 [IU] | Freq: Every day | SUBCUTANEOUS | Status: DC
  Administered 2023-03-28 – 2023-03-30 (×3): 15 [IU] via SUBCUTANEOUS
  Filled 2023-03-28 (×4): qty 0.15

## 2023-03-28 MED ORDER — IOHEXOL 300 MG/ML  SOLN
100.0000 mL | Freq: Once | INTRAMUSCULAR | Status: AC | PRN
  Administered 2023-03-28: 100 mL via INTRAVENOUS

## 2023-03-28 MED ORDER — INSULIN ASPART 100 UNIT/ML IJ SOLN
15.0000 [IU] | Freq: Once | INTRAMUSCULAR | Status: AC
  Administered 2023-03-28: 15 [IU] via SUBCUTANEOUS
  Filled 2023-03-28: qty 0.15

## 2023-03-28 MED ORDER — ONDANSETRON HCL 4 MG PO TABS: 4.0000 mg | ORAL_TABLET | Freq: Four times a day (QID) | ORAL | Status: DC | PRN

## 2023-03-28 MED ORDER — NICOTINE 14 MG/24HR TD PT24: 14.0000 mg | MEDICATED_PATCH | Freq: Every day | TRANSDERMAL | Status: DC | PRN

## 2023-03-28 MED ORDER — ONDANSETRON HCL 4 MG/2ML IJ SOLN: 4.0000 mg | Freq: Four times a day (QID) | INTRAMUSCULAR | Status: DC | PRN

## 2023-03-28 MED ORDER — INSULIN ASPART 100 UNIT/ML IJ SOLN
0.0000 [IU] | Freq: Three times a day (TID) | INTRAMUSCULAR | Status: DC
  Administered 2023-03-29: 11 [IU] via SUBCUTANEOUS
  Administered 2023-03-29: 5 [IU] via SUBCUTANEOUS
  Administered 2023-03-30: 8 [IU] via SUBCUTANEOUS
  Administered 2023-03-31: 5 [IU] via SUBCUTANEOUS

## 2023-03-28 MED ORDER — SODIUM CHLORIDE 0.9 % IV BOLUS
1000.0000 mL | Freq: Once | INTRAVENOUS | Status: DC
Start: 2023-03-28 — End: 2023-03-28

## 2023-03-28 MED ORDER — ENOXAPARIN SODIUM 40 MG/0.4ML IJ SOSY
40.0000 mg | PREFILLED_SYRINGE | INTRAMUSCULAR | Status: DC
  Administered 2023-03-28 – 2023-03-30 (×3): 40 mg via SUBCUTANEOUS
  Filled 2023-03-28 (×3): qty 0.4

## 2023-03-28 MED ORDER — ACETAMINOPHEN 325 MG PO TABS: 650.0000 mg | ORAL_TABLET | Freq: Four times a day (QID) | ORAL | Status: DC | PRN

## 2023-03-28 NOTE — H&P (Signed)
History and Physical    Patient: Steve Romero DOB: 1971-04-25 DOA: 03/28/2023 DOS: the patient was seen and examined on 03/28/2023 PCP: Raliegh Ip, DO  Patient coming from: Home  Chief Complaint:  Chief Complaint  Patient presents with   Abdominal Pain   HPI: Steve Romero is a 52 y.o. male with medical history significant of chronic pancreatitis, AKI, anxiety, and situs, bipolar disorder, depression, schizoaffective disorder, COPD, BPH, hyperlipidemia, hypertension, sleep apnea not on CPAP, thyroid disease who presented to the emergency department with complaints of abdominal pain associated with nausea, 3 episodes of emesis and foamy/yellowish looking diarrhea since yesterday.  He has not tried to eat much since then since the pain gets worse and has been drinking cold fluids.  No constipation, melena or hematochezia.  No flank pain, dysuria or hematuria, positive nocturia and frequency. He denied fever, chills, rhinorrhea, sore throat, wheezing or hemoptysis.  No chest pain, palpitations, diaphoresis, PND, orthopnea or pitting edema of the lower extremities.  No polyuria, polydipsia, polyphagia or blurred vision.   Lab work: Urinalysis showed greater than 500 glucose and ketones of 20 mg/dL CBC showed a white count of 10.1 lipase was 99 units/L.  CMP showed a glucose of 473 mg/dL, ALT of 59 and alkaline phosphatase 456 units/L.  The rest of the CMP measurements were normal after sodium correction.  Imaging: CT abdomen/pelvis with contrast showed previous partial pancreatectomy with residual pancreatic tissue showing persistent fluid and mild stranding over the surgical bed is likely a recurrence of pancreatitis.  New 3.3 cm oval cystic structure within the greater curvature of the stomach.  Slight interval increase in size of about 1.9 x 2.1 cm density abutting the gastric fundus.  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  Please see images and full cardiology report for further details.   ED course: Initial vital signs were temperature 98.4 F, pulse 79, respiration 16, BP 133/80 mmHg O2 sat 97% on room air.  The patient received morphine 4 mg IVP.  I added 2000 mL of normal saline bolus, hydromorphone 1 mg IVP and NovoLog 15 units SQ x 1.  Review of Systems: As mentioned in the history of present illness. All other systems reviewed and are negative.  Past Medical History:  Diagnosis Date   Acute pancreatitis without necrosis or infection, unspecified    AKI (acute kidney injury) (HCC) 05/17/2022   Anxiety 10/2014   Ascites    Bipolar disorder [facility]) age 48   COPD (chronic obstructive pulmonary disease) (HCC) 2016   Depression age 46   Enlarged prostate    Hyperlipidemia 2016   Hypertension 2013   Nausea vomiting and diarrhea 06/15/2022   Schizoaffective disorder (HCC) 11/13/2014   Sleep apnea    Does not wear c-pap, sleeps in sitting up position per pt   Thyroid disease 11/2014   Past Surgical History:  Procedure Laterality Date   BALLOON DILATION N/A 11/07/2020   Procedure: BALLOON DILATION;  Surgeon: Lemar Lofty., MD;  Location: [facility] ENDOSCOPY;  Service: Gastroenterology;  Laterality: N/A;   BALLOON DILATION N/A 06/26/2021   Procedure: BALLOON DILATION;  Surgeon: Meridee Score Netty Starring., MD;  Location: [facility] ENDOSCOPY;  Service: Gastroenterology;  Laterality: N/A;   BILIARY DILATION  07/24/2021   Procedure: BILIARY DILATION;  Surgeon: Meridee Score,  Netty Starring., MD;  Location: [facility] ENDOSCOPY;  Service: Gastroenterology;;   BILIARY DILATION  05/22/2022   Procedure: BILIARY DILATION;  Surgeon: Lemar Lofty., MD;  Location: [facility] ENDOSCOPY;  Service: Gastroenterology;;   BILIARY DILATION   06/16/2022   Procedure: GASTROSTOMY DILATION;  Surgeon: Lemar Lofty., MD;  Location: [facility] ENDOSCOPY;  Service: Gastroenterology;;   BILIARY STENT PLACEMENT N/A 10/10/2020   Procedure: BILIARY STENT PLACEMENT;  Surgeon: Malissa Hippo, MD;  Location: [facility];  Service: Endoscopy;  Laterality: N/A;   BILIARY STENT PLACEMENT  11/09/2020   Procedure: BILIARY STENT PLACEMENT;  Surgeon: Meridee Score Netty Starring., MD;  Location: [facility] ENDOSCOPY;  Service: Gastroenterology;;   BILIARY STENT PLACEMENT  11/07/2020   Procedure: BILIARY STENT PLACEMENT;  Surgeon: Lemar Lofty., MD;  Location: [facility] ENDOSCOPY;  Service: Gastroenterology;;   BILIARY STENT PLACEMENT  11/11/2020   Procedure: BILIARY STENT PLACEMENT;  Surgeon: Lemar Lofty., MD;  Location: [facility] ENDOSCOPY;  Service: Gastroenterology;;   BILIARY STENT PLACEMENT  11/14/2020   Procedure: BILIARY STENT PLACEMENT;  Surgeon: Lemar Lofty., MD;  Location: [facility] ENDOSCOPY;  Service: Gastroenterology;;   BILIARY STENT PLACEMENT N/A 01/29/2021   Procedure: BILIARY STENT PLACEMENT;  Surgeon: Lemar Lofty., MD;  Location: [facility] ENDOSCOPY;  Service: Gastroenterology;  Laterality: N/A;   BILIARY STENT PLACEMENT  06/26/2021   Procedure: BILIARY STENT PLACEMENT;  Surgeon: Meridee Score Netty Starring., MD;  Location: [facility] ENDOSCOPY;  Service: Gastroenterology;;   BILIARY STENT PLACEMENT  07/24/2021   Procedure: BILIARY STENT PLACEMENT;  Surgeon: Lemar Lofty., MD;  Location: [facility] ENDOSCOPY;  Service: Gastroenterology;;   BIOPSY  11/07/2020   Procedure: BIOPSY;  Surgeon: Lemar Lofty., MD;  Location: [facility] ENDOSCOPY;  Service: Gastroenterology;;   BIOPSY  12/20/2020   Procedure: BIOPSY;  Surgeon: Lemar Lofty., MD;  Location: [facility] ENDOSCOPY;  Service: Gastroenterology;;   BIOPSY  06/26/2021   Procedure: BIOPSY;  Surgeon: Lemar Lofty., MD;  Location: [facility] ENDOSCOPY;  Service: Gastroenterology;;   CARPAL  TUNNEL RELEASE Left 02/21/2016   Procedure: CARPAL TUNNEL RELEASE;  Surgeon: Vickki Hearing, MD;  Location: [facility];  Service: Orthopedics;  Laterality: Left;   CHOLECYSTECTOMY N/A 10/09/2020   Procedure: LAPAROSCOPIC CHOLECYSTECTOMY;  Surgeon: Lucretia Roers, MD;  Location: [facility];  Service: General;  Laterality: N/A;   CYST ENTEROSTOMY N/A 11/07/2020   Procedure: CYST ENTEROSTOMY;  Surgeon: Lemar Lofty., MD;  Location: [facility] ENDOSCOPY;  Service: Gastroenterology;  Laterality: N/A;   CYST GASTROSTOMY  11/11/2020   Procedure: CYST NECROSECTOMY;  Surgeon: Meridee Score Netty Starring., MD;  Location: [facility] ENDOSCOPY;  Service: Gastroenterology;;   CYST GASTROSTOMY  11/14/2020   Procedure: CYST NECROSECTOMY;  Surgeon: Lemar Lofty., MD;  Location: [facility] ENDOSCOPY;  Service: Gastroenterology;;   CYST GASTROSTOMY  06/16/2022   Procedure: CYST GASTROSTOMY;  Surgeon: Lemar Lofty., MD;  Location: [facility] ENDOSCOPY;  Service: Gastroenterology;;   CYST REMOVAL HAND     CYSTOSCOPY  06/26/2021   Procedure: CYSTOGASTROSTOMY;  Surgeon: Mansouraty, Netty Starring., MD;  Location: [facility] ENDOSCOPY;  Service: Gastroenterology;;   ENDOSCOPIC RETROGRADE CHOLANGIOPANCREATOGRAPHY (ERCP) WITH PROPOFOL N/A 11/09/2020   Procedure: ENDOSCOPIC RETROGRADE CHOLANGIOPANCREATOGRAPHY (ERCP) WITH PROPOFOL;  Surgeon: Lemar Lofty., MD;  Location: [facility] ENDOSCOPY;  Service: Gastroenterology;  Laterality: N/A;   ENDOSCOPIC RETROGRADE CHOLANGIOPANCREATOGRAPHY (ERCP) WITH PROPOFOL N/A 12/20/2020   Procedure: ENDOSCOPIC RETROGRADE CHOLANGIOPANCREATOGRAPHY (ERCP) WITH PROPOFOL;  Surgeon: Meridee Score Netty Starring., MD;  Location: [facility] ENDOSCOPY;  Service: Gastroenterology;  Laterality: N/A;   ENDOSCOPIC RETROGRADE CHOLANGIOPANCREATOGRAPHY (ERCP) WITH  PROPOFOL N/A 01/29/2021   Procedure: ENDOSCOPIC RETROGRADE CHOLANGIOPANCREATOGRAPHY (ERCP) WITH PROPOFOL;  Surgeon: Meridee Score Netty Starring., MD;  Location: [facility] ENDOSCOPY;   Service: Gastroenterology;  Laterality: N/A;   ENDOSCOPIC RETROGRADE CHOLANGIOPANCREATOGRAPHY (ERCP) WITH PROPOFOL N/A 07/24/2021   Procedure: ENDOSCOPIC RETROGRADE CHOLANGIOPANCREATOGRAPHY (ERCP) WITH PROPOFOL;  Surgeon: Meridee Score Netty Starring., MD;  Location: [facility] ENDOSCOPY;  Service: Gastroenterology;  Laterality: N/A;   ERCP N/A 10/10/2020   Procedure: ENDOSCOPIC RETROGRADE CHOLANGIOPANCREATOGRAPHY (ERCP);  Surgeon: Malissa Hippo, MD;  Location: [facility];  Service: Endoscopy;  Laterality: N/A;   ERCP     ERCP N/A 05/22/2022   Procedure: ENDOSCOPIC RETROGRADE CHOLANGIOPANCREATOGRAPHY (ERCP);  Surgeon: Lemar Lofty., MD;  Location: [facility] ENDOSCOPY;  Service: Gastroenterology;  Laterality: N/A;   ESOPHAGOGASTRODUODENOSCOPY N/A 11/11/2020   Procedure: ESOPHAGOGASTRODUODENOSCOPY (EGD);  Surgeon: Lemar Lofty., MD;  Location: [facility] ENDOSCOPY;  Service: Gastroenterology;  Laterality: N/A;   ESOPHAGOGASTRODUODENOSCOPY N/A 11/14/2020   Procedure: ESOPHAGOGASTRODUODENOSCOPY (EGD);  Surgeon: Lemar Lofty., MD;  Location: [facility] ENDOSCOPY;  Service: Gastroenterology;  Laterality: N/A;   ESOPHAGOGASTRODUODENOSCOPY (EGD) WITH PROPOFOL N/A 11/09/2020   Procedure: ESOPHAGOGASTRODUODENOSCOPY (EGD) WITH PROPOFOL;  Surgeon: Meridee Score Netty Starring., MD;  Location: [facility] ENDOSCOPY;  Service: Gastroenterology;  Laterality: N/A;   ESOPHAGOGASTRODUODENOSCOPY (EGD) WITH PROPOFOL N/A 11/07/2020   Procedure: ESOPHAGOGASTRODUODENOSCOPY (EGD) WITH PROPOFOL;  Surgeon: Meridee Score Netty Starring., MD;  Location: [facility] ENDOSCOPY;  Service: Gastroenterology;  Laterality: N/A;   ESOPHAGOGASTRODUODENOSCOPY (EGD) WITH PROPOFOL N/A 12/20/2020   Procedure: ESOPHAGOGASTRODUODENOSCOPY (EGD) WITH PROPOFOL;  Surgeon: Meridee Score Netty Starring., MD;  Location: [facility] ENDOSCOPY;  Service: Gastroenterology;  Laterality: N/A;   ESOPHAGOGASTRODUODENOSCOPY (EGD) WITH PROPOFOL N/A 06/26/2021   Procedure: ESOPHAGOGASTRODUODENOSCOPY (EGD)  WITH PROPOFOL;  Surgeon: Meridee Score Netty Starring., MD;  Location: [facility] ENDOSCOPY;  Service: Gastroenterology;  Laterality: N/A;   ESOPHAGOGASTRODUODENOSCOPY (EGD) WITH PROPOFOL N/A 07/24/2021   Procedure: ESOPHAGOGASTRODUODENOSCOPY (EGD) WITH PROPOFOL;  Surgeon: Meridee Score Netty Starring., MD;  Location: [facility] ENDOSCOPY;  Service: Gastroenterology;  Laterality: N/A;  AXIOS STENT   ESOPHAGOGASTRODUODENOSCOPY (EGD) WITH PROPOFOL N/A 05/22/2022   Procedure: ESOPHAGOGASTRODUODENOSCOPY (EGD) WITH PROPOFOL;  Surgeon: Meridee Score Netty Starring., MD;  Location: [facility] ENDOSCOPY;  Service: Gastroenterology;  Laterality: N/A;   ESOPHAGOGASTRODUODENOSCOPY (EGD) WITH PROPOFOL N/A 06/16/2022   Procedure: ESOPHAGOGASTRODUODENOSCOPY (EGD) WITH PROPOFOL;  Surgeon: Meridee Score Netty Starring., MD;  Location: [facility] ENDOSCOPY;  Service: Gastroenterology;  Laterality: N/A;   ESOPHAGOGASTRODUODENOSCOPY (EGD) WITH PROPOFOL N/A 07/30/2022   Procedure: ESOPHAGOGASTRODUODENOSCOPY (EGD) WITH PROPOFOL;  Surgeon: Meridee Score Netty Starring., MD;  Location: [facility] ENDOSCOPY;  Service: Gastroenterology;  Laterality: N/A;   EUS  11/14/2020   Procedure: UPPER ENDOSCOPIC ULTRASOUND (EUS) LINEAR;  Surgeon: Lemar Lofty., MD;  Location: [facility] ENDOSCOPY;  Service: Gastroenterology;;   EUS N/A 06/26/2021   Procedure: UPPER ENDOSCOPIC ULTRASOUND (EUS) RADIAL;  Surgeon: Lemar Lofty., MD;  Location: [facility] ENDOSCOPY;  Service: Gastroenterology;  Laterality: N/A;   EUS N/A 05/22/2022   Procedure: UPPER ENDOSCOPIC ULTRASOUND (EUS) LINEAR;  Surgeon: Lemar Lofty., MD;  Location: [facility] ENDOSCOPY;  Service: Gastroenterology;  Laterality: N/A;   EUS N/A 06/16/2022   Procedure: UPPER ENDOSCOPIC ULTRASOUND (EUS) LINEAR;  Surgeon: Lemar Lofty., MD;  Location: [facility] ENDOSCOPY;  Service: Gastroenterology;  Laterality: N/A;   FINE NEEDLE ASPIRATION  05/22/2022   Procedure: FINE NEEDLE ASPIRATION;  Surgeon: Meridee Score Netty Starring., MD;  Location: [facility]  ENDOSCOPY;  Service: Gastroenterology;;   GASTROINTESTINAL STENT REMOVAL  12/20/2020   Procedure: GASTROINTESTINAL STENT REMOVAL;  Surgeon: Lemar Lofty., MD;  Location: [facility] ENDOSCOPY;  Service: Gastroenterology;;  cyst gastrostomy stent and  double pig tail stents x2 removed   HERNIA REPAIR Left 2002   groin   INGUINAL HERNIA REPAIR Left 04/05/2017   Procedure: RECURRENT HERNIA REPAIR INGUINAL ADULT WITH MESH;  Surgeon: Franky Macho, MD;  Location: [facility];  Service: General;  Laterality: Left;   MASS EXCISION Right 04/29/2020   Procedure: EXCISION MASS RIGHT WRIST;  Surgeon: Betha Loa, MD;  Location: [facility];  Service: Orthopedics;  Laterality: Right;   MASS EXCISION Right 10/09/2020   Procedure: EXCISION MASS, ABDOMINAL WALL, 2CM;  Surgeon: Lucretia Roers, MD;  Location: [facility];  Service: General;  Laterality: Right;   PANCREATIC STENT PLACEMENT  06/26/2021   Procedure: AXIOS STENT PLACEMENT;  Surgeon: Lemar Lofty., MD;  Location: [facility] ENDOSCOPY;  Service: Gastroenterology;;   PANCREATIC STENT PLACEMENT  06/16/2022   Procedure: PANCREATIC STENT PLACEMENT;  Surgeon: Lemar Lofty., MD;  Location: [facility] ENDOSCOPY;  Service: Gastroenterology;;   REMOVAL OF STONES  12/20/2020   Procedure: REMOVAL OF STONES;  Surgeon: Lemar Lofty., MD;  Location: [facility] ENDOSCOPY;  Service: Gastroenterology;;   REMOVAL OF STONES  07/24/2021   Procedure: REMOVAL OF STONES;  Surgeon: Lemar Lofty., MD;  Location: [facility] ENDOSCOPY;  Service: Gastroenterology;;   REMOVAL OF STONES  05/22/2022   Procedure: REMOVAL OF STONES;  Surgeon: Lemar Lofty., MD;  Location: [facility] ENDOSCOPY;  Service: Gastroenterology;;   Dennison Mascot N/A 10/10/2020   Procedure: Dennison Mascot;  Surgeon: Malissa Hippo, MD;  Location: [facility];  Service: Endoscopy;  Laterality: N/A;   SPHINCTEROTOMY  07/24/2021   Procedure: SPHINCTEROTOMY;  Surgeon: Mansouraty, Netty Starring.,  MD;  Location: [facility] ENDOSCOPY;  Service: Gastroenterology;;   SPLENECTOMY, TOTAL N/A 11/17/2022   Procedure: SPLENECTOMY;  Surgeon: Fritzi Mandes, MD;  Location: [facility] OR;  Service: General;  Laterality: N/A;   SPYGLASS CHOLANGIOSCOPY N/A 12/20/2020   Procedure: OZDGUYQI CHOLANGIOSCOPY;  Surgeon: Lemar Lofty., MD;  Location: [facility] ENDOSCOPY;  Service: Gastroenterology;  Laterality: N/A;   SPYGLASS CHOLANGIOSCOPY N/A 07/24/2021   Procedure: SPYGLASS CHOLANGIOSCOPY;  Surgeon: Lemar Lofty., MD;  Location: [facility] ENDOSCOPY;  Service: Gastroenterology;  Laterality: N/A;   STENT REMOVAL  11/09/2020   Procedure: STENT REMOVAL;  Surgeon: Lemar Lofty., MD;  Location: [facility] ENDOSCOPY;  Service: Gastroenterology;;   Francine Graven REMOVAL  11/11/2020   Procedure: STENT REMOVAL;  Surgeon: Lemar Lofty., MD;  Location: [facility] ENDOSCOPY;  Service: Gastroenterology;;   Francine Graven REMOVAL  11/14/2020   Procedure: STENT REMOVAL;  Surgeon: Lemar Lofty., MD;  Location: [facility] ENDOSCOPY;  Service: Gastroenterology;;   Francine Graven REMOVAL  12/20/2020   Procedure: STENT REMOVAL;  Surgeon: Lemar Lofty., MD;  Location: [facility] ENDOSCOPY;  Service: Gastroenterology;;  biliary x2   STENT REMOVAL  07/24/2021   Procedure: AXIOS STENT REMOVAL;  Surgeon: Lemar Lofty., MD;  Location: [facility] ENDOSCOPY;  Service: Gastroenterology;;   Francine Graven REMOVAL  07/30/2022   Procedure: STENT REMOVAL;  Surgeon: Lemar Lofty., MD;  Location: [facility] ENDOSCOPY;  Service: Gastroenterology;;   UPPER ESOPHAGEAL ENDOSCOPIC ULTRASOUND (EUS) N/A 11/07/2020   Procedure: UPPER ESOPHAGEAL ENDOSCOPIC ULTRASOUND (EUS);  Surgeon: Lemar Lofty., MD;  Location: [facility] ENDOSCOPY;  Service: Gastroenterology;  Laterality: N/A;   UPPER GASTROINTESTINAL ENDOSCOPY     WOUND DEBRIDEMENT  11/09/2020   Procedure: CYST NECROSECTOMY;  Surgeon: Meridee Score Netty Starring., MD;  Location: [facility] ENDOSCOPY;  Service: Gastroenterology;;    Social History:  reports that he has been smoking cigarettes. He has a 6.5 pack-year smoking history. He has never  used smokeless tobacco. He reports that he does not drink alcohol and does not use drugs.  Allergies  Allergen Reactions   Desipramine Nausea Only and Rash    Family History  Problem Relation Age of Onset   Diabetes Father    Heart disease Father    Heart disease Maternal Grandmother    Stroke Maternal Grandfather    Colon cancer Neg Hx    Esophageal cancer Neg Hx    Inflammatory bowel disease Neg Hx    Liver disease Neg Hx    Pancreatic cancer Neg Hx    Rectal cancer Neg Hx    Stomach cancer Neg Hx     Prior to Admission medications   Medication Sig Start Date End Date Taking? Authorizing Provider  acetaminophen (TYLENOL) 500 MG tablet Take 2 tablets (1,000 mg total) by mouth every 8 (eight) hours as needed for mild pain. 11/23/22   Fritzi Mandes, MD  albuterol (VENTOLIN HFA) 108 (90 Base) MCG/ACT inhaler Inhale 2 puffs into the lungs every 6 (six) hours as needed for wheezing or shortness of breath. 11/23/22   Rolly Salter, MD  Continuous Blood Gluc Receiver (FREESTYLE LIBRE READER) DEVI 1 Units by Does not apply route daily. UAD to test BGs daily. Dx E11.9 09/25/22   Raliegh Ip, DO  Continuous Blood Gluc Sensor (FREESTYLE LIBRE 3 SENSOR) MISC Place 1 sensor on the skin every 14 days. Use to check glucose continuously e11.9 09/25/22   Delynn Flavin M, DO  insulin glargine (LANTUS SOLOSTAR) 100 UNIT/ML Solostar Pen Inject 26 Units into the skin daily. TO REPLACE TOUJEO Patient taking differently: Inject 24 Units into the skin at bedtime. TO REPLACE TOUJEO 12/28/22   Delynn Flavin M, DO  Insulin Pen Needle (BD PEN NEEDLE NANO U/F) 32G X 4 MM MISC UAD to administer insulin 09/25/22   Delynn Flavin M, DO  lipase/protease/amylase (CREON) 36000 UNITS CPEP capsule Take 3 capsules with meals and 1-2  capsule with snacks ( up too 2 snacks ) 01/20/23    Mansouraty, Netty Starring., MD  rosuvastatin (CRESTOR) 10 MG tablet Take 1 tablet (10 mg total) by mouth at bedtime. If you experience muscle aches take every other day 02/04/23   Mallipeddi, Vishnu P, MD  traMADol (ULTRAM) 50 MG tablet Take 1 tablet (50 mg total) by mouth every 8 (eight) hours as needed. 01/20/23   Mansouraty, Netty Starring., MD    Physical Exam: Vitals:   03/28/23 4098 03/28/23 0955 03/28/23 0958 03/28/23 1351  BP:   133/80 118/82  Pulse: 79   (!) 58  Resp: 16   17  Temp: 98.4 F (36.9 C)   97.8 F (36.6 C)  TempSrc: Oral   Oral  SpO2: 97%   98%  Weight:  80.3 kg    Height:  5\' 9"  (1.753 m)     Physical Exam Vitals and nursing note reviewed.  Constitutional:      General: He is awake. He is not in acute distress.    Appearance: He is well-developed and overweight. He is ill-appearing. He is not toxic-appearing.  HENT:     Head: Normocephalic.     Nose: No rhinorrhea.     Mouth/Throat:     Mouth: Mucous membranes are dry.  Eyes:     General: No scleral icterus.    Pupils: Pupils are equal, round, and reactive to light.  Neck:     Vascular: No JVD.  Cardiovascular:     Rate and Rhythm:  Normal rate and regular rhythm.     Heart sounds: S1 normal and S2 normal.  Pulmonary:     Effort: Pulmonary effort is normal.     Breath sounds: Normal breath sounds. No wheezing, rhonchi or rales.  Abdominal:     General: Bowel sounds are normal.     Palpations: Abdomen is soft.     Tenderness: There is abdominal tenderness in the epigastric area and left upper quadrant. There is left CVA tenderness. There is no right CVA tenderness, guarding or rebound.  Musculoskeletal:     Cervical back: Neck supple.     Right lower leg: No edema.     Left lower leg: No edema.  Skin:    General: Skin is warm and dry.  Neurological:     General: No focal deficit present.     Mental Status: He is alert and oriented to person, place, and time.  Psychiatric:        Mood and Affect: Mood  normal.        Behavior: Behavior normal. Behavior is cooperative.   Data Reviewed:  Results are pending, will review when available.  03/03/2021 transthoracic echocardiogram IMPRESSIONS:   1. Subvalvular LVOT acceleration is noted. Left ventricular ejection  fraction, by estimation, is 70 to 75%. The left ventricle has hyperdynamic  function. The left ventricle has no regional wall motion abnormalities.  There is mild left ventricular  hypertrophy. Left ventricular diastolic parameters were normal.   2. Right ventricular systolic function is normal. The right ventricular  size is normal.   3. The mitral valve is grossly normal. No evidence of mitral valve  regurgitation.   4. The aortic valve is tricuspid. Aortic valve regurgitation is not  visualized. No aortic stenosis is present.   5. The inferior vena cava is normal in size with <50% respiratory  variability, suggesting right atrial pressure of 8 mmHg.   Assessment and Plan: Principal Problem:   History of pancreatectomy/Pancreatic pseudocyst   Biliary stricture Presenting with:   Acute on chronic pancreatitis (HCC) Observation/MedSurg. Continue IV fluids. Keep n.p.o. for now. Analgesics as needed. Antiemetics as needed. Pantoprazole 40 mg IVP daily. Follow CBC, CMP and lipase in AM.  Active Problems:   Essential hypertension This has improved with unintentional weight loss. Monitor BP/as needed antihypertensives.    Hyperlipidemia Off rosuvastatin 10 mg p.o. daily. Follow-up with primary care provider.    Tobacco use disorder Tobacco cessation advised. Nicotine replacement therapy ordered.    COPD (chronic obstructive pulmonary disease) (HCC) Smoking cessation advised. Bronchodilators as needed.    Advance Care Planning:   Code Status: Full Code   Consults: [facility] will see in AM.  Family Communication:   Severity of Illness: The appropriate patient status for this patient is  OBSERVATION. Observation status is judged to be reasonable and necessary in order to provide the required intensity of service to ensure the patient's safety. The patient's presenting symptoms, physical exam findings, and initial radiographic and laboratory data in the context of their medical condition is felt to place them at decreased risk for further clinical deterioration. Furthermore, it is anticipated that the patient will be medically stable for discharge from the hospital within 2 midnights of admission.   Author: Bobette Mo, MD 03/28/2023 2:15 PM  For on call review [email or web address].   This document was prepared using Dragon voice recognition software and may contain some unintended transcription errors.

## 2023-03-28 NOTE — ED Provider Notes (Signed)
[place] EMERGENCY DEPARTMENT AT [facility] Provider Note   CSN: [id number] Arrival date & time: 03/28/23  7829     History  Chief Complaint  Patient presents with   Abdominal Pain    Steve Romero is a 52 y.o. male with a history of pancreatic necrosis, recurrent pancreatitis, diabetes mellitus, COPD, and hypertension who presents to the ED today for abdominal pain.  Patient reports persistent epigastric pain for past couple days that radiates to his back.  He has had 2 episodes of bilious vomiting since yesterday as well as yellowish diarrhea.  Pain is worse after eating.  States cold fluids and warm help with the pain.  Patient tells me that his pain is similar to when he had pancreatitis in the past.  Reports "stinging" with urination but denies fever or chills.  No complaints or concerns at this time.  Patient has an extensive history of abdominal surgeries: Splenectomy, cystectomy, and pancreatectomy.    Home Medications Prior to Admission medications   Medication Sig Start Date End Date Taking? Authorizing Provider  acetaminophen (TYLENOL) 500 MG tablet Take 2 tablets (1,000 mg total) by mouth every 8 (eight) hours as needed for mild pain. 11/23/22   Fritzi Mandes, MD  albuterol (VENTOLIN HFA) 108 (90 Base) MCG/ACT inhaler Inhale 2 puffs into the lungs every 6 (six) hours as needed for wheezing or shortness of breath. 11/23/22   Rolly Salter, MD  Continuous Blood Gluc Receiver (FREESTYLE LIBRE READER) DEVI 1 Units by Does not apply route daily. UAD to test BGs daily. Dx E11.9 09/25/22   Raliegh Ip, DO  Continuous Blood Gluc Sensor (FREESTYLE LIBRE 3 SENSOR) MISC Place 1 sensor on the skin every 14 days. Use to check glucose continuously e11.9 09/25/22   Delynn Flavin M, DO  insulin glargine (LANTUS SOLOSTAR) 100 UNIT/ML Solostar Pen Inject 26 Units into the skin daily. TO REPLACE TOUJEO Patient taking differently: Inject 24 Units into the skin at  bedtime. TO REPLACE TOUJEO 12/28/22   Delynn Flavin M, DO  Insulin Pen Needle (BD PEN NEEDLE NANO U/F) 32G X 4 MM MISC UAD to administer insulin 09/25/22   Delynn Flavin M, DO  lipase/protease/amylase (CREON) 36000 UNITS CPEP capsule Take 3 capsules with meals and 1-2  capsule with snacks ( up too 2 snacks ) 01/20/23   Mansouraty, Netty Starring., MD  rosuvastatin (CRESTOR) 10 MG tablet Take 1 tablet (10 mg total) by mouth at bedtime. If you experience muscle aches take every other day 02/04/23   Mallipeddi, Vishnu P, MD  traMADol (ULTRAM) 50 MG tablet Take 1 tablet (50 mg total) by mouth every 8 (eight) hours as needed. 01/20/23   Mansouraty, Netty Starring., MD      Allergies    Desipramine    Review of Systems   Review of Systems  Gastrointestinal:  Positive for abdominal pain.  All other systems reviewed and are negative.   Physical Exam Updated Vital Signs BP 118/82   Pulse (!) 58   Temp 97.8 F (36.6 C) (Oral)   Resp 17   Ht 5\' 9"  (1.753 m)   Wt 80.3 kg   SpO2 98%   BMI 26.14 kg/m  Physical Exam Vitals and nursing note reviewed.  Constitutional:      Appearance: Normal appearance.  HENT:     Head: Normocephalic and atraumatic.     Mouth/Throat:     Mouth: Mucous membranes are moist.  Eyes:     Conjunctiva/sclera:  Conjunctivae normal.     Pupils: Pupils are equal, round, and reactive to light.  Cardiovascular:     Rate and Rhythm: Normal rate and regular rhythm.     Pulses: Normal pulses.     Heart sounds: Normal heart sounds.  Pulmonary:     Effort: Pulmonary effort is normal.     Breath sounds: Normal breath sounds.  Abdominal:     Palpations: Abdomen is soft.     Tenderness: There is abdominal tenderness.     Comments: Epigastric tenderness to palpation  Skin:    General: Skin is warm and dry.     Findings: No rash.  Neurological:     General: No focal deficit present.     Mental Status: He is alert.  Psychiatric:        Mood and Affect: Mood normal.         Behavior: Behavior normal.     ED Results / Procedures / Treatments   Labs (all labs ordered are listed, but only abnormal results are displayed) Labs Reviewed  COMPREHENSIVE METABOLIC PANEL - Abnormal; Notable for the following components:      Result Value   Sodium 132 (*)    Chloride 96 (*)    Glucose, Bld 473 (*)    ALT 59 (*)    Alkaline Phosphatase 456 (*)    All other components within normal limits  LIPASE, BLOOD - Abnormal; Notable for the following components:   Lipase 99 (*)    All other components within normal limits  CBC WITH DIFFERENTIAL/PLATELET - Abnormal; Notable for the following components:   Platelets 453 (*)    All other components within normal limits  URINALYSIS, ROUTINE W REFLEX MICROSCOPIC - Abnormal; Notable for the following components:   Color, Urine STRAW (*)    Specific Gravity, Urine 1.032 (*)    Glucose, UA >=500 (*)    Ketones, ur 20 (*)    All other components within normal limits    EKG None  Radiology CT ABDOMEN PELVIS W CONTRAST  Result Date: 03/28/2023 CLINICAL DATA:  Epigastric pain as suspect pancreatitis. Previous partial pancreatectomy and splenectomy. EXAM: CT ABDOMEN AND PELVIS WITH CONTRAST TECHNIQUE: Multidetector CT imaging of the abdomen and pelvis was performed using the standard protocol following bolus administration of intravenous contrast. RADIATION DOSE REDUCTION: This exam was performed according to the departmental dose-optimization program which includes automated exposure control, adjustment of the mA and/or kV according to patient size and/or use of iterative reconstruction technique. CONTRAST:  OMNIPAQUE IOHEXOL 300 MG/ML  SOLN COMPARISON:  02/12/2023 FINDINGS: Lower chest: Suggestion mild emphysematous disease. No focal airspace consolidation or effusion. Heart is normal size. Hepatobiliary: Previous cholecystectomy. No evidence of liver mass. There is dilatation of the common bile duct and central intrahepatic  ducts without significant change. Pancreas: Evidence of patient's previous partial pancreatectomy. Again noted is a rim of pancreas over the pancreatic head unchanged. There appears to be a small remnant of pancreatic tissue over the distal body of the pancreas containing a segment of main pancreatic duct measuring approximately 5-6 mm (image [id number]) as this is unchanged. There is persistent fluid and mild fat stranding over the pancreatic surgical bed which may be slightly worse as cannot exclude active pancreatitis. There is a new 3.3 cm oval cystic structure abutting the greater curvature of the stomach. Slight interval decrease in size of a 1.9 x 2.1 cm density abutting the gastric fundus. These findings may represent persistent pancreatic pseudocysts versus other  infected versus noninfected postsurgical collections. Spleen: Prior splenectomy. Adrenals/Urinary Tract: Adrenal glands are normal. Kidneys are normal size without hydronephrosis or nephrolithiasis. Ureters and bladder are normal. Stomach/Bowel: Stomach as described. Mild mucosal enhancement and wall thickening involving the duodenum and very proximal jejunum. Mid to distal small bowel is normal. Appendix is normal. Mild fecal retention throughout the colon which is otherwise unremarkable. Vascular/Lymphatic: Abdominal aorta is normal caliber. Remaining vascular structures are unremarkable. No adenopathy. Reproductive: Normal. Other: None. Musculoskeletal: No focal abnormality. IMPRESSION: 1. Evidence of patient's previous partial pancreatectomy and splenectomy. Residual pancreatic tissue over the pancreatic head and also over the expected distal body of the pancreas. Persistent fluid and mild fat stranding over the pancreatic surgical bed which may be slightly worse and may represent active pancreatitis. New 3.3 cm oval cystic structure abutting the greater curvature of the stomach. Slight interval decrease in size of a 1.9 x 2.1 cm density abutting  the gastric fundus. These findings may represent persistent pancreatic pseudocysts versus other infected versus noninfected postsurgical collections. 2. Mild mucosal enhancement and wall thickening involving the duodenum and very proximal jejunum which may be secondary to adjacent pancreatic inflammatory process. 3. Previous cholecystectomy with stable dilatation of the common bile duct and central intrahepatic ducts. 4. Mild fecal retention throughout the colon. 5. Suggestion of mild emphysematous disease. Emphysema (ICD10-J43.9). Electronically Signed   By: Elberta Fortis M.D.   On: 03/28/2023 13:44    Procedures Procedures: not indicated.   Medications Ordered in ED Medications  sodium chloride 0.9 % bolus 2,000 mL (has no administration in time range)  insulin aspart (novoLOG) injection 15 Units (has no administration in time range)  morphine (PF) 4 MG/ML injection 4 mg (4 mg Intravenous Given 03/28/23 1140)  iohexol (OMNIPAQUE) 300 MG/ML solution 100 mL (100 mLs Intravenous Contrast Given 03/28/23 1246)  HYDROmorphone (DILAUDID) injection 1 mg (1 mg Intravenous Given 03/28/23 1409)    ED Course/ Medical Decision Making/ A&P                                 Medical Decision Making Amount and/or Complexity of Data Reviewed Labs: ordered. Radiology: ordered.  Risk Prescription drug management.   This patient presents to the ED for concern of epigastric pain, this involves an extensive number of treatment options, and is a complaint that carries with it a high risk of complications and morbidity.   Differential diagnosis includes: Pancreatitis, gastritis, gastroenteritis, GERD, other viral illness, IBS, IBD, diverticulitis, etc.   Comorbidities  See HPI above   Additional History  Additional history obtained from previous records.  Lab Tests  I ordered and personally interpreted labs.  The pertinent results include:   Elevated lipase of 99 Elevated glucose of 473, pulse of  156, ALT of 59 otherwise CMP is within normal limits. CBC does not show any signs of acute infection or anemia. Patient no signs of infection.   Imaging Studies  I ordered imaging studies including CT abdomen/pelvis  I independently visualized and interpreted imaging which showed:  1. Evidence of patient's previous partial pancreatectomy and splenectomy. Residual pancreatic tissue over the pancreatic head and also over the expected distal body of the pancreas. Persistent fluid and mild fat stranding over the pancreatic surgical bed which may be slightly worse and may represent active pancreatitis. New 3.3 cm oval cystic structure abutting the greater curvature of the stomach. Slight interval decrease in size of a 1.9 x  2.1 cm density abutting the gastric fundus. These findings may represent persistent pancreatic pseudocysts versus other infected versus noninfected postsurgical collections. 2. Mild mucosal enhancement and wall thickening involving the duodenum and very proximal jejunum which may be secondary to adjacent pancreatic inflammatory process. 3. Previous cholecystectomy with stable dilatation of the common bile duct and central intrahepatic ducts. 4. Mild fecal retention throughout the colon. 5. Suggestion of mild emphysematous disease.  I agree with the radiologist interpretation   Consultations  I requested consultation with [facility],  and discussed lab and imaging findings as well as pertinent plan - they recommend:  admit patient to the hospitalist service and they will consult. In consultation with Dr. Robb Matar, hospitalist who will accept patient for admission.   Problem List / ED Course / Critical Interventions / Medication Management  Epigastric pain I ordered medications including: Morphine for pain  Reevaluation of the patient after these medicines showed that the patient stayed the same and I then ordered Dilaudid for the pain. I have reviewed the patients home  medicines and have made adjustments as needed   Social Determinants of Health  Tobacco use   Test / Admission - Considered  Discussed results with patient.  Patient is agreeable with plan for admission.       Final Clinical Impression(s) / ED Diagnoses Final diagnoses:  Acute pancreatitis, unspecified complication status, unspecified pancreatitis type    Rx / DC Orders ED Discharge Orders     None         Maxwell Marion, PA-C 03/28/23 1419    Lorre Nick, MD 03/29/23 [phone]

## 2023-03-28 NOTE — ED Notes (Signed)
ED TO INPATIENT HANDOFF REPORT  ED Nurse Name and Phone #: Suzanna Obey [phone]  S Name/Age/Gender Steve Romero 52 y.o. male Room/Bed: WA22/WA22  Code Status   Code Status: Prior  Home/SNF/Other Home Patient oriented to: self, place, time, and situation Is this baseline? Yes   Triage Complete: Triage complete  Chief Complaint Acute pancreatitis [K85.90]  Triage Note No notes on file   Allergies Allergies  Allergen Reactions   Desipramine Nausea Only and Rash    Level of Care/Admitting Diagnosis ED Disposition     ED Disposition  Admit   Condition  --   Comment  Hospital Area: [facility] [[id number]]  Level of Care: Med-Surg [16]  May place patient in observation at [facility] or [facility] if equivalent level of care is available:: No  Covid Evaluation: Asymptomatic - no recent exposure (last 10 days) testing not required  Diagnosis: Acute pancreatitis [577.0.ICD-9-CM]  Admitting Physician: Bobette Mo [[id number]]  Attending Physician: Bobette Mo [[id number]]          B Medical/Surgery History Past Medical History:  Diagnosis Date   Acute pancreatitis without necrosis or infection, unspecified    AKI (acute kidney injury) (HCC) 05/17/2022   Anxiety 10/2014   Ascites    Bipolar disorder (HCC) age 41   COPD (chronic obstructive pulmonary disease) (HCC) 2016   Depression age 42   Enlarged prostate    Hyperlipidemia 2016   Hypertension 2013   Nausea vomiting and diarrhea 06/15/2022   Schizoaffective disorder (HCC) 11/13/2014   Sleep apnea    Does not wear c-pap, sleeps in sitting up position per pt   Thyroid disease 11/2014   Past Surgical History:  Procedure Laterality Date   BALLOON DILATION N/A 11/07/2020   Procedure: BALLOON DILATION;  Surgeon: Lemar Lofty., MD;  Location: [facility] ENDOSCOPY;  Service: Gastroenterology;  Laterality: N/A;   BALLOON DILATION N/A 06/26/2021   Procedure: BALLOON DILATION;   Surgeon: Meridee Score Netty Starring., MD;  Location: [facility] ENDOSCOPY;  Service: Gastroenterology;  Laterality: N/A;   BILIARY DILATION  07/24/2021   Procedure: BILIARY DILATION;  Surgeon: Meridee Score Netty Starring., MD;  Location: [facility] ENDOSCOPY;  Service: Gastroenterology;;   BILIARY DILATION  05/22/2022   Procedure: BILIARY DILATION;  Surgeon: Lemar Lofty., MD;  Location: [facility] ENDOSCOPY;  Service: Gastroenterology;;   BILIARY DILATION  06/16/2022   Procedure: GASTROSTOMY DILATION;  Surgeon: Lemar Lofty., MD;  Location: [facility] ENDOSCOPY;  Service: Gastroenterology;;   BILIARY STENT PLACEMENT N/A 10/10/2020   Procedure: BILIARY STENT PLACEMENT;  Surgeon: Malissa Hippo, MD;  Location: [facility];  Service: Endoscopy;  Laterality: N/A;   BILIARY STENT PLACEMENT  11/09/2020   Procedure: BILIARY STENT PLACEMENT;  Surgeon: Meridee Score Netty Starring., MD;  Location: [facility] ENDOSCOPY;  Service: Gastroenterology;;   BILIARY STENT PLACEMENT  11/07/2020   Procedure: BILIARY STENT PLACEMENT;  Surgeon: Lemar Lofty., MD;  Location: [facility] ENDOSCOPY;  Service: Gastroenterology;;   BILIARY STENT PLACEMENT  11/11/2020   Procedure: BILIARY STENT PLACEMENT;  Surgeon: Lemar Lofty., MD;  Location: [facility]COPY;  Service: Gastroenterology;;   BILIARY STENT PLACEMENT  11/14/2020   Procedure: BILIARY STENT PLACEMENT;  Surgeon: Lemar Lofty., MD;  Location: [facility] ENDOSCOPY;  Service: Gastroenterology;;   BILIARY STENT PLACEMENT N/A 01/29/2021   Procedure: BILIARY STENT PLACEMENT;  Surgeon: Lemar Lofty., MD;  Location: [facility] ENDOSCOPY;  Service: Gastroenterology;  Laterality: N/A;   BILIARY STENT PLACEMENT  06/26/2021   Procedure: BILIARY STENT PLACEMENT;  Surgeon: Meridee Score Netty Starring., MD;  Location: [facility] ENDOSCOPY;  Service: Gastroenterology;;   BILIARY STENT PLACEMENT  07/24/2021   Procedure: BILIARY STENT PLACEMENT;  Surgeon: Lemar Lofty., MD;  Location: [facility] ENDOSCOPY;   Service: Gastroenterology;;   BIOPSY  11/07/2020   Procedure: BIOPSY;  Surgeon: Lemar Lofty., MD;  Location: [facility] ENDOSCOPY;  Service: Gastroenterology;;   BIOPSY  12/20/2020   Procedure: BIOPSY;  Surgeon: Lemar Lofty., MD;  Location: [facility] ENDOSCOPY;  Service: Gastroenterology;;   BIOPSY  06/26/2021   Procedure: BIOPSY;  Surgeon: Lemar Lofty., MD;  Location: [facility] ENDOSCOPY;  Service: Gastroenterology;;   CARPAL TUNNEL RELEASE Left 02/21/2016   Procedure: CARPAL TUNNEL RELEASE;  Surgeon: Vickki Hearing, MD;  [facility];  Service: Orthopedics;  Laterality: Left;   CHOLECYSTECTOMY N/A 10/09/2020   Procedure: LAPAROSCOPIC CHOLECYSTECTOMY;  Surgeon: Lucretia Roers, MD;  Location: [facility];  Service: General;  Laterality: N/A;   CYST ENTEROSTOMY N/A 11/07/2020   Procedure: CYST ENTEROSTOMY;  Surgeon: Lemar Lofty., MD;  Location: [facility] ENDOSCOPY;  Service: Gastroenterology;  Laterality: N/A;   CYST GASTROSTOMY  11/11/2020   Procedure: CYST NECROSECTOMY;  Surgeon: Meridee Score Netty Starring., MD;  Location: [facility] ENDOSCOPY;  Service: Gastroenterology;;   CYST GASTROSTOMY  11/14/2020   Procedure: CYST NECROSECTOMY;  Surgeon: Lemar Lofty., MD;  Location: [facility] ENDOSCOPY;  Service: Gastroenterology;;   CYST GASTROSTOMY  06/16/2022   Procedure: CYST GASTROSTOMY;  Surgeon: Lemar Lofty., MD;  Location: [facility] ENDOSCOPY;  Service: Gastroenterology;;   CYST REMOVAL HAND     CYSTOSCOPY  06/26/2021   Procedure: CYSTOGASTROSTOMY;  Surgeon: Mansouraty, Netty Starring., MD;  Location: [facility] ENDOSCOPY;  Service: Gastroenterology;;   ENDOSCOPIC RETROGRADE CHOLANGIOPANCREATOGRAPHY (ERCP) WITH PROPOFOL N/A 11/09/2020   Procedure: ENDOSCOPIC RETROGRADE CHOLANGIOPANCREATOGRAPHY (ERCP) WITH PROPOFOL;  Surgeon: Lemar Lofty., MD;  Location: [facility] ENDOSCOPY;  Service: Gastroenterology;  Laterality: N/A;   ENDOSCOPIC RETROGRADE CHOLANGIOPANCREATOGRAPHY (ERCP) WITH  PROPOFOL N/A 12/20/2020   Procedure: ENDOSCOPIC RETROGRADE CHOLANGIOPANCREATOGRAPHY (ERCP) WITH PROPOFOL;  Surgeon: Meridee Score Netty Starring., MD;  Location: [facility] ENDOSCOPY;  Service: Gastroenterology;  Laterality: N/A;   ENDOSCOPIC RETROGRADE CHOLANGIOPANCREATOGRAPHY (ERCP) WITH PROPOFOL N/A 01/29/2021   Procedure: ENDOSCOPIC RETROGRADE CHOLANGIOPANCREATOGRAPHY (ERCP) WITH PROPOFOL;  Surgeon: Meridee Score Netty Starring., MD;  Location: [facility] ENDOSCOPY;  Service: Gastroenterology;  Laterality: N/A;   ENDOSCOPIC RETROGRADE CHOLANGIOPANCREATOGRAPHY (ERCP) WITH PROPOFOL N/A 07/24/2021   Procedure: ENDOSCOPIC RETROGRADE CHOLANGIOPANCREATOGRAPHY (ERCP) WITH PROPOFOL;  Surgeon: Meridee Score Netty Starring., MD;  Location: [facility] ENDOSCOPY;  Service: Gastroenterology;  Laterality: N/A;   ERCP N/A 10/10/2020   Procedure: ENDOSCOPIC RETROGRADE CHOLANGIOPANCREATOGRAPHY (ERCP);  Surgeon: Malissa Hippo, MD;  [facility];  Service: Endoscopy;  Laterality: N/A;   ERCP     ERCP N/A 05/22/2022   Procedure: ENDOSCOPIC RETROGRADE CHOLANGIOPANCREATOGRAPHY (ERCP);  Surgeon: Lemar Lofty., MD;  Location: [facility] ENDOSCOPY;  Service: Gastroenterology;  Laterality: N/A;   ESOPHAGOGASTRODUODENOSCOPY N/A 11/11/2020   Procedure: ESOPHAGOGASTRODUODENOSCOPY (EGD);  Surgeon: Lemar Lofty., MD;  Location: [facility] ENDOSCOPY;  Service: Gastroenterology;  Laterality: N/A;   ESOPHAGOGASTRODUODENOSCOPY N/A 11/14/2020   Procedure: ESOPHAGOGASTRODUODENOSCOPY (EGD);  Surgeon: Lemar Lofty., MD;  Location: [facility] ENDOSCOPY;  Service: Gastroenterology;  Laterality: N/A;   ESOPHAGOGASTRODUODENOSCOPY (EGD) WITH PROPOFOL N/A 11/09/2020   Procedure: ESOPHAGOGASTRODUODENOSCOPY (EGD) WITH PROPOFOL;  Surgeon: Meridee Score Netty Starring., MD;  Location: [facility] ENDOSCOPY;  Service: Gastroenterology;  Laterality: N/A;   ESOPHAGOGASTRODUODENOSCOPY (EGD) WITH PROPOFOL N/A 11/07/2020   Procedure: ESOPHAGOGASTRODUODENOSCOPY (EGD) WITH PROPOFOL;  Surgeon:  Meridee Score Netty Starring., MD;  Location: [facility] ENDOSCOPY;  Service: Gastroenterology;  Laterality: N/A;   ESOPHAGOGASTRODUODENOSCOPY (EGD) WITH PROPOFOL N/A 12/20/2020   Procedure: ESOPHAGOGASTRODUODENOSCOPY (EGD) WITH PROPOFOL;  Surgeon: Meridee Score Netty Starring., MD;  Location: [facility] ENDOSCOPY;  Service: Gastroenterology;  Laterality: N/A;   ESOPHAGOGASTRODUODENOSCOPY (EGD) WITH PROPOFOL N/A 06/26/2021   Procedure: ESOPHAGOGASTRODUODENOSCOPY (EGD) WITH PROPOFOL;  Surgeon: Meridee Score Netty Starring., MD;  Location: [facility] ENDOSCOPY;  Service: Gastroenterology;  Laterality: N/A;   ESOPHAGOGASTRODUODENOSCOPY (EGD) WITH PROPOFOL N/A 07/24/2021   Procedure: ESOPHAGOGASTRODUODENOSCOPY (EGD) WITH PROPOFOL;  Surgeon: Meridee Score Netty Starring., MD;  Location: [facility] ENDOSCOPY;  Service: Gastroenterology;  Laterality: N/A;  AXIOS STENT   ESOPHAGOGASTRODUODENOSCOPY (EGD) WITH PROPOFOL N/A 05/22/2022   Procedure: ESOPHAGOGASTRODUODENOSCOPY (EGD) WITH PROPOFOL;  Surgeon: Meridee Score Netty Starring., MD;  Location[facility]OSCOPY;  Service: Gastroenterology;  Laterality: N/A;   ESOPHAGOGASTRODUODENOSCOPY (EGD) WITH PROPOFOL N/A 06/16/2022   Procedure: ESOPHAGOGASTRODUODENOSCOPY (EGD) WITH PROPOFOL;  Surgeon: Meridee Score Netty Starring., MD;  Location: [facility]OSCOPY;  Service: Gastroenterology;  Laterality: N/A;   ESOPHAGOGASTRODUODENOSCOPY (EGD) WITH PROPOFOL N/A 07/30/2022   Procedure: ESOPHAGOGASTRODUODENOSCOPY (EGD) WITH PROPOFOL;  Surgeon: Meridee Score Netty Starring., MD;  Location: [facility] ENDOSCOPY;  Service: Gastroenterology;  Laterality: N/A;   EUS  11/14/2020   Procedure: UPPER ENDOSCOPIC ULTRASOUND (EUS) LINEAR;  Surgeon: Lemar Lofty., MD;  Location: [facility] ENDOSCOPY;  Service: Gastroenterology;;   EUS N/A 06/26/2021   Procedure: UPPER ENDOSCOPIC ULTRASOUND (EUS) RADIAL;  Surgeon: Lemar Lofty., MD;  Location: [facility] ENDOSCOPY;  Service: Gastroenterology;  Laterality: N/A;   EUS N/A 05/22/2022   Procedure: UPPER ENDOSCOPIC  ULTRASOUND (EUS) LINEAR;  Surgeon: Lemar Lofty., MD;  Location: [facility]OSCOPY;  Service: Gastroenterology;  Laterality: N/A;   EUS N/A 06/16/2022   Procedure: UPPER ENDOSCOPIC ULTRASOUND (EUS) LINEAR;  Surgeon: Lemar Lofty., MD;  Location: [facility]OSCOPY;  Service: Gastroenterology;  Laterality: N/A;   FINE NEEDLE ASPIRATION  05/22/2022   Procedure: FINE NEEDLE ASPIRATION;  Surgeon: Meridee Score Netty Starring., MD;  Location: [facility] ENDOSCOPY;  Service: Gastroenterology;;   GASTROINTESTINAL STENT REMOVAL  12/20/2020   Procedure: GASTROINTESTINAL STENT REMOVAL;  Surgeon: Lemar Lofty., MD;  Location: [facility] ENDOSCOPY;  Service: Gastroenterology;;  cyst gastrostomy stent and double pig tail stents x2 removed   HERNIA REPAIR Left 2002   groin   INGUINAL HERNIA REPAIR Left 04/05/2017   Procedure: RECURRENT HERNIA REPAIR INGUINAL ADULT WITH MESH;  Surgeon: Franky Macho, MD;  Location: [facility];  Service: General;  Laterality: Left;   MASS EXCISION Right 04/29/2020   Procedure: EXCISION MASS RIGHT WRIST;  Surgeon: Betha Loa, MD;  Location: [facility];  Service: Orthopedics;  Laterality: Right;   MASS EXCISION Right 10/09/2020   Procedure: EXCISION MASS, ABDOMINAL WALL, 2CM;  Surgeon: Lucretia Roers, MD;  Location: [facility];  Service: General;  Laterality: Right;   PANCREATIC STENT PLACEMENT  06/26/2021   Procedure: AXIOS STENT PLACEMENT;  Surgeon: Lemar Lofty., MD;  Location: [facility] ENDOSCOPY;  Service: Gastroenterology;;   PANCREATIC STENT PLACEMENT  06/16/2022   Procedure: PANCREATIC STENT PLACEMENT;  Surgeon: Lemar Lofty., MD;  Location: [facility] ENDOSCOPY;  Service: Gastroenterology;;   REMOVAL OF STONES  12/20/2020   Procedure: REMOVAL OF STONES;  Surgeon: Lemar Lofty., MD;  Location: [facility] ENDOSCOPY;  Service: Gastroenterology;;   REMOVAL OF STONES  07/24/2021   Procedure: REMOVAL OF STONES;  Surgeon: Lemar Lofty., MD;  Location:  [facility] ENDOSCOPY;  Service: Gastroenterology;;   REMOVAL OF STONES  05/22/2022   Procedure: REMOVAL OF STONES;  Surgeon: Lemar Lofty., MD;  Location: [facility] ENDOSCOPY;  Service: Gastroenterology;;   Dennison Mascot N/A 10/10/2020   Procedure: Dennison Mascot;  Surgeon: Malissa Hippo, MD;  Location: [facility];  Service: Endoscopy;  Laterality: N/A;   SPHINCTEROTOMY  07/24/2021   Procedure: SPHINCTEROTOMY;  Surgeon: Mansouraty, Netty Starring., MD;  Location: [facility] ENDOSCOPY;  Service: Gastroenterology;;   SPLENECTOMY, TOTAL N/A 11/17/2022   Procedure: SPLENECTOMY;  Surgeon: Fritzi Mandes, MD;  Location: [facility] OR;  Service: General;  Laterality: N/A;   SPYGLASS CHOLANGIOSCOPY N/A 12/20/2020   Procedure: ZDGUYQIH CHOLANGIOSCOPY;  Surgeon: Lemar Lofty., MD;  Location: [facility]OSCOPY;  Service: Gastroenterology;  Laterality: N/A;   SPYGLASS CHOLANGIOSCOPY N/A 07/24/2021   Procedure: SPYGLASS CHOLANGIOSCOPY;  Surgeon: Lemar Lofty., MD;  Location: [facility] ENDOSCOPY;  Service: Gastroenterology;  Laterality: N/A;   STENT REMOVAL  11/09/2020   Procedure: STENT REMOVAL;  Surgeon: Lemar Lofty., MD;  Location: [facility] ENDOSCOPY;  Service: Gastroenterology;;   Francine Graven REMOVAL  11/11/2020   Procedure: STENT REMOVAL;  Surgeon: Lemar Lofty., MD;  Location: [facility] ENDOSCOPY;  Service: Gastroenterology;;   Francine Graven REMOVAL  11/14/2020   Procedure: STENT REMOVAL;  Surgeon: Lemar Lofty., MD;  Location: [facility] ENDOSCOPY;  Service: Gastroenterology;;   Francine Graven REMOVAL  12/20/2020   Procedure: STENT REMOVAL;  Surgeon: Lemar Lofty., MD;  Location: [facility] ENDOSCOPY;  Service: Gastroenterology;;  biliary x2   STENT REMOVAL  07/24/2021   Procedure: AXIOS STENT REMOVAL;  Surgeon: Lemar Lofty., MD;  Location: [facility] ENDOSCOPY;  Service: Gastroenterology;;   Francine Graven REMOVAL  07/30/2022   Procedure: STENT REMOVAL;  Surgeon: Lemar Lofty., MD;  Location: [facility]OSCOPY;  Service:  Gastroenterology;;   UPPER ESOPHAGEAL ENDOSCOPIC ULTRASOUND (EUS) N/A 11/07/2020   Procedure: UPPER ESOPHAGEAL ENDOSCOPIC ULTRASOUND (EUS);  Surgeon: Lemar Lofty., MD;  Location: [facility] ENDOSCOPY;  Service: Gastroenterology;  Laterality: N/A;   UPPER GASTROINTESTINAL ENDOSCOPY     WOUND DEBRIDEMENT  11/09/2020   Procedure: CYST NECROSECTOMY;  Surgeon: Meridee Score Netty Starring., MD;  Location: [facility] ENDOSCOPY;  Service: Gastroenterology;;     A IV Location/Drains/Wounds Patient Lines/Drains/Airways Status     Active Line/Drains/Airways     Name Placement date Placement time Site Days   Peripheral IV 03/28/23 20 G 1" Right Antecubital 03/28/23  1137  Antecubital  less than 1   Closed System Drain 1 Right;Lateral RLQ Bulb (JP) 10/09/20  0855  RLQ  900   GI Stent 10 Fr. 10/10/20  1540  --  899   GI Stent 7 Fr. 11/07/20  1456  --  871   GI Stent 11/07/20  1457  --  871   GI Stent 8.5 Fr. 11/09/20  1027  --  869   GI Stent 10 Fr. 11/14/20  1237  --  864   GI Stent  01/29/21  1311  --  788   GI Stent  06/26/21  0840  --  640   GI Stent 7 Fr. 06/26/21  0840  --  640   GI Stent 10 Fr. 07/24/21  1100  --  612   Incision - 4 Ports Umbilicus Medial;Superior Right;Lateral Right;Lateral;Distal 10/09/20  0751  -- 900            Intake/Output Last 24 hours No intake or output data in the 24 hours ending 03/28/23 1434  Labs/Imaging Results for orders placed or performed during the hospital encounter of 03/28/23 (from the past 48 hour(s))  Comprehensive metabolic panel     Status: Abnormal   Collection Time: 03/28/23 11:40 AM  Result Value  Ref Range   Sodium 132 (L) 135 - 145 mmol/L   Potassium 4.3 3.5 - 5.1 mmol/L   Chloride 96 (L) 98 - 111 mmol/L   CO2 24 22 - 32 mmol/L   Glucose, Bld 473 (H) 70 - 99 mg/dL    Comment: Glucose reference range applies only to samples taken after fasting for at least 8 hours.   BUN 11 6 - 20 mg/dL   Creatinine, Ser 9.60 0.61 - 1.24 mg/dL   Calcium  9.3 8.9 - 45.4 mg/dL   Total Protein 7.3 6.5 - 8.1 g/dL   Albumin 4.0 3.5 - 5.0 g/dL   AST 20 15 - 41 U/L   ALT 59 (H) 0 - 44 U/L   Alkaline Phosphatase 456 (H) 38 - 126 U/L   Total Bilirubin 0.7 0.3 - 1.2 mg/dL   GFR, Estimated >09 >81 mL/min    Comment: (NOTE) Calculated using the CKD-EPI Creatinine Equation (2021)    Anion gap 12 5 - 15    Comment: Performed at [facility], [address] [place], [place] [address]  Lipase, blood     Status: Abnormal   Collection Time: 03/28/23 11:40 AM  Result Value Ref Range   Lipase 99 (H) 11 - 51 U/L    Comment: Performed at [facility], [address], [place], [place] [address]  CBC with Differential     Status: Abnormal   Collection Time: 03/28/23 11:40 AM  Result Value Ref Range   WBC 10.1 4.0 - 10.5 K/uL   RBC 5.81 4.22 - 5.81 MIL/uL   Hemoglobin 16.2 13.0 - 17.0 g/dL   HCT 21.3 08.6 - 57.8 %   MCV 83.3 80.0 - 100.0 fL   MCH 27.9 26.0 - 34.0 pg   MCHC 33.5 30.0 - 36.0 g/dL   RDW 46.9 62.9 - 52.8 %   Platelets 453 (H) 150 - 400 K/uL   nRBC 0.0 0.0 - 0.2 %   Neutrophils Relative % 65 %   Neutro Abs 6.6 1.7 - 7.7 K/uL   Lymphocytes Relative 25 %   Lymphs Abs 2.5 0.7 - 4.0 K/uL   Monocytes Relative 7 %   Monocytes Absolute 0.7 0.1 - 1.0 K/uL   Eosinophils Relative 1 %   Eosinophils Absolute 0.1 0.0 - 0.5 K/uL   Basophils Relative 1 %   Basophils Absolute 0.1 0.0 - 0.1 K/uL   Immature Granulocytes 1 %   Abs Immature Granulocytes 0.05 0.00 - 0.07 K/uL    Comment: Performed at [facility], [address] [place], [place] [address]  Urinalysis, Routine w reflex microscopic -Urine, Clean Catch     Status: Abnormal   Collection Time: 03/28/23 12:00 PM  Result Value Ref Range   Color, Urine STRAW (A) YELLOW   APPearance CLEAR CLEAR   Specific Gravity, Urine 1.032 (H) 1.005 - 1.030   pH 5.0 5.0 - 8.0   Glucose, UA >=500 (A) NEGATIVE mg/dL   Hgb urine dipstick NEGATIVE  NEGATIVE   Bilirubin Urine NEGATIVE NEGATIVE   Ketones, ur 20 (A) NEGATIVE mg/dL   Protein, ur NEGATIVE NEGATIVE mg/dL   Nitrite NEGATIVE NEGATIVE   Leukocytes,Ua NEGATIVE NEGATIVE   RBC / HPF 0-5 0 - 5 RBC/hpf   WBC, UA 0-5 0 - 5 WBC/hpf   Bacteria, UA NONE SEEN NONE SEEN   Squamous Epithelial / HPF 0-5 0 - 5 /HPF    Comment: Performed at [facility], [address] [place], [place] [address]   CT ABDOMEN PELVIS W CONTRAST  Result Date: 03/28/2023 CLINICAL DATA:  Epigastric pain as suspect pancreatitis. Previous partial pancreatectomy and splenectomy. EXAM: CT ABDOMEN AND PELVIS WITH CONTRAST TECHNIQUE: Multidetector CT imaging of the abdomen and pelvis was performed using the standard protocol following bolus administration of intravenous contrast. RADIATION DOSE REDUCTION: This exam was performed according to the departmental dose-optimization program which includes automated exposure control, adjustment of the mA and/or kV according to patient size and/or use of iterative reconstruction technique. CONTRAST:  OMNIPAQUE IOHEXOL 300 MG/ML  SOLN COMPARISON:  02/12/2023 FINDINGS: Lower chest: Suggestion mild emphysematous disease. No focal airspace consolidation or effusion. Heart is normal size. Hepatobiliary: Previous cholecystectomy. No evidence of liver mass. There is dilatation of the common bile duct and central intrahepatic ducts without significant change. Pancreas: Evidence of patient's previous partial pancreatectomy. Again noted is a rim of pancreas over the pancreatic head unchanged. There appears to be a small remnant of pancreatic tissue over the distal body of the pancreas containing a segment of main pancreatic duct measuring approximately 5-6 mm (image [id number]) as this is unchanged. There is persistent fluid and mild fat stranding over the pancreatic surgical bed which may be slightly worse as cannot exclude active pancreatitis. There is a new 3.3 cm oval cystic  structure abutting the greater curvature of the stomach. Slight interval decrease in size of a 1.9 x 2.1 cm density abutting the gastric fundus. These findings may represent persistent pancreatic pseudocysts versus other infected versus noninfected postsurgical collections. Spleen: Prior splenectomy. Adrenals/Urinary Tract: Adrenal glands are normal. Kidneys are normal size without hydronephrosis or nephrolithiasis. Ureters and bladder are normal. Stomach/Bowel: Stomach as described. Mild mucosal enhancement and wall thickening involving the duodenum and very proximal jejunum. Mid to distal small bowel is normal. Appendix is normal. Mild fecal retention throughout the colon which is otherwise unremarkable. Vascular/Lymphatic: Abdominal aorta is normal caliber. Remaining vascular structures are unremarkable. No adenopathy. Reproductive: Normal. Other: None. Musculoskeletal: No focal abnormality. IMPRESSION: 1. Evidence of patient's previous partial pancreatectomy and splenectomy. Residual pancreatic tissue over the pancreatic head and also over the expected distal body of the pancreas. Persistent fluid and mild fat stranding over the pancreatic surgical bed which may be slightly worse and may represent active pancreatitis. New 3.3 cm oval cystic structure abutting the greater curvature of the stomach. Slight interval decrease in size of a 1.9 x 2.1 cm density abutting the gastric fundus. These findings may represent persistent pancreatic pseudocysts versus other infected versus noninfected postsurgical collections. 2. Mild mucosal enhancement and wall thickening involving the duodenum and very proximal jejunum which may be secondary to adjacent pancreatic inflammatory process. 3. Previous cholecystectomy with stable dilatation of the common bile duct and central intrahepatic ducts. 4. Mild fecal retention throughout the colon. 5. Suggestion of mild emphysematous disease. Emphysema (ICD10-J43.9). Electronically Signed    By: Elberta Fortis M.D.   On: 03/28/2023 13:44    Pending Labs Unresulted Labs (From admission, onward)    None       Vitals/Pain Today's Vitals   03/28/23 1208 03/28/23 1351 03/28/23 1431 03/28/23 1434  BP:  118/82    Pulse:  (!) 58    Resp:  17    Temp:  97.8 F (36.6 C)    TempSrc:  Oral    SpO2:  98%    Weight:      Height:      PainSc: 6   5  5  Isolation Precautions No active isolations  Medications Medications  morphine (PF) 4 MG/ML injection 4 mg (4 mg Intravenous Given 03/28/23 1140)  iohexol (OMNIPAQUE) 300 MG/ML solution 100 mL (100 mLs Intravenous Contrast Given 03/28/23 1246)  HYDROmorphone (DILAUDID) injection 1 mg (1 mg Intravenous Given 03/28/23 1409)  sodium chloride 0.9 % bolus 2,000 mL (2,000 mLs Intravenous New Bag/Given 03/28/23 1430)  insulin aspart (novoLOG) injection 15 Units (15 Units Subcutaneous Given 03/28/23 1431)    Mobility walks     Focused Assessments N/A   R Recommendations: See Admitting Provider Note  Report given to:   Additional Notes: N/A

## 2023-03-29 DIAGNOSIS — K8681 Exocrine pancreatic insufficiency: Secondary | ICD-10-CM | POA: Diagnosis present

## 2023-03-29 DIAGNOSIS — K859 Acute pancreatitis without necrosis or infection, unspecified: Secondary | ICD-10-CM | POA: Diagnosis present

## 2023-03-29 DIAGNOSIS — E1165 Type 2 diabetes mellitus with hyperglycemia: Secondary | ICD-10-CM | POA: Diagnosis present

## 2023-03-29 DIAGNOSIS — F419 Anxiety disorder, unspecified: Secondary | ICD-10-CM | POA: Diagnosis present

## 2023-03-29 DIAGNOSIS — R112 Nausea with vomiting, unspecified: Secondary | ICD-10-CM | POA: Diagnosis not present

## 2023-03-29 DIAGNOSIS — K831 Obstruction of bile duct: Secondary | ICD-10-CM | POA: Diagnosis not present

## 2023-03-29 DIAGNOSIS — I10 Essential (primary) hypertension: Secondary | ICD-10-CM | POA: Diagnosis present

## 2023-03-29 DIAGNOSIS — K863 Pseudocyst of pancreas: Secondary | ICD-10-CM

## 2023-03-29 DIAGNOSIS — F17210 Nicotine dependence, cigarettes, uncomplicated: Secondary | ICD-10-CM | POA: Diagnosis present

## 2023-03-29 DIAGNOSIS — K861 Other chronic pancreatitis: Secondary | ICD-10-CM | POA: Diagnosis present

## 2023-03-29 DIAGNOSIS — Z888 Allergy status to other drugs, medicaments and biological substances status: Secondary | ICD-10-CM | POA: Diagnosis not present

## 2023-03-29 DIAGNOSIS — Z794 Long term (current) use of insulin: Secondary | ICD-10-CM | POA: Diagnosis not present

## 2023-03-29 DIAGNOSIS — Z8249 Family history of ischemic heart disease and other diseases of the circulatory system: Secondary | ICD-10-CM | POA: Diagnosis not present

## 2023-03-29 DIAGNOSIS — Z86010 Personal history of colonic polyps: Secondary | ICD-10-CM | POA: Diagnosis not present

## 2023-03-29 DIAGNOSIS — Z9049 Acquired absence of other specified parts of digestive tract: Secondary | ICD-10-CM | POA: Diagnosis not present

## 2023-03-29 DIAGNOSIS — R748 Abnormal levels of other serum enzymes: Secondary | ICD-10-CM | POA: Diagnosis present

## 2023-03-29 DIAGNOSIS — Z8711 Personal history of peptic ulcer disease: Secondary | ICD-10-CM | POA: Diagnosis not present

## 2023-03-29 DIAGNOSIS — J449 Chronic obstructive pulmonary disease, unspecified: Secondary | ICD-10-CM | POA: Diagnosis present

## 2023-03-29 DIAGNOSIS — N401 Enlarged prostate with lower urinary tract symptoms: Secondary | ICD-10-CM | POA: Diagnosis present

## 2023-03-29 DIAGNOSIS — Z79899 Other long term (current) drug therapy: Secondary | ICD-10-CM | POA: Diagnosis not present

## 2023-03-29 DIAGNOSIS — D75839 Thrombocytosis, unspecified: Secondary | ICD-10-CM | POA: Diagnosis present

## 2023-03-29 DIAGNOSIS — G4730 Sleep apnea, unspecified: Secondary | ICD-10-CM | POA: Diagnosis present

## 2023-03-29 DIAGNOSIS — E785 Hyperlipidemia, unspecified: Secondary | ICD-10-CM | POA: Diagnosis present

## 2023-03-29 DIAGNOSIS — Z833 Family history of diabetes mellitus: Secondary | ICD-10-CM | POA: Diagnosis not present

## 2023-03-29 DIAGNOSIS — Z90411 Acquired partial absence of pancreas: Secondary | ICD-10-CM | POA: Diagnosis not present

## 2023-03-29 DIAGNOSIS — Z9081 Acquired absence of spleen: Secondary | ICD-10-CM | POA: Diagnosis not present

## 2023-03-29 DIAGNOSIS — R1013 Epigastric pain: Secondary | ICD-10-CM | POA: Diagnosis not present

## 2023-03-29 LAB — CBC
HCT: 46 % (ref 39.0–52.0)
Hemoglobin: 15.2 g/dL (ref 13.0–17.0)
MCH: 28.3 pg (ref 26.0–34.0)
MCHC: 33 g/dL (ref 30.0–36.0)
MCV: 85.5 fL (ref 80.0–100.0)
Platelets: 451 10*3/uL — ABNORMAL HIGH (ref 150–400)
RBC: 5.38 MIL/uL (ref 4.22–5.81)
RDW: 15.1 % (ref 11.5–15.5)
WBC: 10.5 10*3/uL (ref 4.0–10.5)
nRBC: 0 % (ref 0.0–0.2)

## 2023-03-29 LAB — COMPREHENSIVE METABOLIC PANEL
ALT: 55 U/L — ABNORMAL HIGH (ref 0–44)
AST: 33 U/L (ref 15–41)
Albumin: 3.4 g/dL — ABNORMAL LOW (ref 3.5–5.0)
Alkaline Phosphatase: 417 U/L — ABNORMAL HIGH (ref 38–126)
Anion gap: 10 (ref 5–15)
BUN: 11 mg/dL (ref 6–20)
CO2: 27 mmol/L (ref 22–32)
Calcium: 9.2 mg/dL (ref 8.9–10.3)
Chloride: 99 mmol/L (ref 98–111)
Creatinine, Ser: 0.68 mg/dL (ref 0.61–1.24)
GFR, Estimated: >60 mL/min (ref >60)
Glucose, Bld: 232 mg/dL — ABNORMAL HIGH (ref 70–99)
Potassium: 4.7 mmol/L (ref 3.5–5.1)
Sodium: 136 mmol/L (ref 135–145)
Total Bilirubin: 0.7 mg/dL (ref 0.3–1.2)
Total Protein: 6.4 g/dL — ABNORMAL LOW (ref 6.5–8.1)

## 2023-03-29 LAB — GLUCOSE, CAPILLARY
Glucose-Capillary: 235 mg/dL — ABNORMAL HIGH (ref 70–99)
Glucose-Capillary: 311 mg/dL — ABNORMAL HIGH (ref 70–99)
Glucose-Capillary: 74 mg/dL (ref 70–99)
Glucose-Capillary: 198 mg/dL — ABNORMAL HIGH (ref 70–99)

## 2023-03-29 MED ORDER — OXYCODONE HCL 5 MG PO TABS
5.0000 mg | ORAL_TABLET | Freq: Four times a day (QID) | ORAL | Status: DC | PRN
  Administered 2023-03-29 – 2023-03-30 (×5): 5 mg via ORAL
  Filled 2023-03-29 (×5): qty 1

## 2023-03-29 MED ORDER — LACTATED RINGERS IV SOLN: INTRAVENOUS | Status: DC

## 2023-03-29 NOTE — Consult Note (Addendum)
Consultation  Referring Provider:   [facility] Primary Care Physician:  Raliegh Ip, DO Primary Gastroenterologist:  Dr. Meridee Score       Reason for Consultation:   Epigastric pain in setting of recurrent pancreatitis/duct disruption s/p distal pancreatomy and splenectomy  DOA: 03/28/2023         Hospital Day: 2         HPI:   Steve Romero is a 52 y.o. 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 Freida Busman, EPI, chronic abdominal discomfort, colon polyps (TAs) .   Presents to the ER with epigastric discomfort, nausea, diarrhea.  Work up notable for  WBC 10.1, lipase 99,  Glucose 473, urinalysis glucose greater than 500, ketones 20 ALT 59, alk phos 456 CT abdomen pelvis with contrast shows previous partial pancreatectomy and splenectomy residual pancreatic tissue pancreatic head with persistent fluid and mild fat stranding over the pancreatic surgical bed may be slightly worse may represent acute pancreatitis.  New 3.3 cm oval cystic structure abutting the greater curvature of the stomach, slight interval decrease in size of 1.9 x 2.1 cm density abutting gastric fundus may represent pancreatic pseudocyst versus postsurgical collections infected versus noninfected.  Mild mucosal enhancement and wall thickening duodenum and very proximal jejunum potentially secondary to pancreatic inflammatory processes.  Previous cholecystectomy with stable dilation of the common bile duct and central intrahepatic ducts.  Mild fecal retention throughout the colon.  No family was present at the time of my evaluation. Patient states since his  surgery in May has had 20 pound weight loss.   Feels like he is eating well at home until the past 2 or 3 days he had nausea with vomiting and unable to hold anything down. States he continues to have epigastric/ left upper quadrant abdominal discomfort with radiation to his back even after the surgery though slightly different than prior.   Can occasionally be worse with eating but cannot identify trigger foods.   Abdominal discomfort can be worse with lying down. He does have belching and feels as if air can get trapped but denies reflux or dysphagia. He has had yellow stools recently more loose stools 2 times a day last 1 was this morning more liquid/solid.  Denies melena or hematochezia. Patient denies fever, chills.  Denies shortness of breath or chest pain. Patient normally takes tramadol 2 to 3 days/week and Tylenol as needed denies NSAIDs. Patient does still smoke 4 cigarettes daily but denies any alcohol or drug use. Denies any recent supplements/OTC/new medications.   Previous GI procedures  EGD 07/30/22 for AXIOS stent removal - No gross lesions in the entire esophagus. - Z-line irregular, 41 cm from the incisors. - Pre-existing cystgastrostomy stents were found and removed. - Pseudocyst cavity entered and shows healthy viable tissue present. - Non-bleeding gastric ulcer with a clean ulcer base (Forrest Class III) from the previous stents. - No gross lesions in the duodenal bulb, in the first portion of the duodenum and in the second portion of the duodenum.   December 2023 upper EUS EGD Impression:                           - LA Grade C erosive esophagitis with  no bleeding.                           - Z-line irregular, 41 cm from the incisors.                           - Extrinsic compression in the gastric antrum.                           - Erythematous mucosa in the stomach.                           - No gross lesions in the duodenal bulb, in the                             first portion of the duodenum and in the second                            portion of the duodenum (only seen after EUS                            completed).  EUS Impression:                           - A cystic lesion was seen in the genu of the                            pancreas and pancreatic body. Tissue has not been                            obtained. However, the endosonographic appearance                            is consistent with a pancreatic pseudocyst. AXIOS                            cystgastrostomy created. Dilated tract. Aspiration                            of 500 mL of fluid.                           - Pancreatic parenchymal abnormalities consisting                            of atrophy were noted in the pancreatic body and                            pancreatic tail.                           - The pancreatic duct had a dilated endosonographic                            appearance  in the body of the pancreas and tail of                            the pancreas.                           - Cystogastrostomy was performed.   November 2023 EUS EGD Impression:                           - No gross lesions in the entire esophagus. Z-line                            irregular, 41 cm from the incisors.                           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                           - Extrinsic impression/compression in the gastric                            antrum.                           - Needed pediatric colonoscope to traverse into the                            duodenum.                           - No gross lesions in the duodenal bulb, in the                            first portion of the duodenum and in the second                            portion of the duodenum. EUS Impression:                           - A cystic lesion was seen in the genu of the                            pancreas. Tissue has not been obtained. However,                             the endosonographic appearance is consistent with a                            pancreatic pseudocyst. Decision made due to the                            small size to perform an aspiration rather than  cystgastrostomy. Fine needle aspiration for fluid                            performed.                           - Pancreatic parenchymal abnormalities consisting                            of atrophy, lobularity and hyperechoic strands were                            noted in the entire pancreas.                           - The pancreatic duct had a dilated endosonographic                            appearance in the genu of the pancreas and body of                            the pancreas. The pancreatic duct measured up to 5                            mm in diameter in these regions.                           - There was dilation in the common bile duct and in                            the common hepatic duct which measured up to 14 mm.                           - No malignant-appearing lymph nodes were                            visualized in the celiac region (level 20),                            peripancreatic region and porta hepatis region.   November 2023 ERCP - The major papilla appeared edematous. Prior                            biliary sphincterotomy appeared open but was                            slightly narrowed.                           - The fluoroscopic examination was suspicious for                            sludge.                           -  Distal narrowing noted in the duct. Balloon                            sphincteroplasty performed.                           - The biliary tree was swept and sludge was found.                           - An irregularity was found in the ventral                            pancreatic duct in the head of the pancreas and I                            could not traverse deeply into the proximal                             pancreatic duct - I am concerned as I had been                            previously of duct disruption (failed prior                            pancreatic evaluation at [facility] as well in the past).  Abnormal ED labs: Abnormal Labs Reviewed  COMPREHENSIVE METABOLIC PANEL - Abnormal; Notable for the following components:      Result Value   Sodium 132 (*)    Chloride 96 (*)    Glucose, Bld 473 (*)    ALT 59 (*)    Alkaline Phosphatase 456 (*)    All other components within normal limits  LIPASE, BLOOD - Abnormal; Notable for the following components:   Lipase 99 (*)    All other components within normal limits  CBC WITH DIFFERENTIAL/PLATELET - Abnormal; Notable for the following components:   Platelets 453 (*)    All other components within normal limits  URINALYSIS, ROUTINE W REFLEX MICROSCOPIC - Abnormal; Notable for the following components:   Color, Urine STRAW (*)    Specific Gravity, Urine 1.032 (*)    Glucose, UA >=500 (*)    Ketones, ur 20 (*)    All other components within normal limits  COMPREHENSIVE METABOLIC PANEL - Abnormal; Notable for the following components:   Glucose, Bld 232 (*)    Total Protein 6.4 (*)    Albumin 3.4 (*)    ALT 55 (*)    Alkaline Phosphatase 417 (*)    All other components within normal limits  CBC - Abnormal; Notable for the following components:   Platelets 451 (*)    All other components within normal limits  GLUCOSE, CAPILLARY - Abnormal; Notable for the following components:   Glucose-Capillary 262 (*)    All other components within normal limits  GLUCOSE, CAPILLARY - Abnormal; Notable for the following components:   Glucose-Capillary 235 (*)    All other components within normal limits    Past Medical History:  Diagnosis Date   Acute pancreatitis without necrosis or infection, unspecified    AKI (acute kidney injury) (HCC) 05/17/2022  Anxiety 10/2014   Ascites    Bipolar disorder [facility]) age 26   COPD  (chronic obstructive pulmonary disease) (HCC) 2016   Depression age 31   Enlarged prostate    Hyperlipidemia 2016   Hypertension 2013   Nausea vomiting and diarrhea 06/15/2022   Schizoaffective disorder (HCC) 11/13/2014   Sleep apnea    Does not wear c-pap, sleeps in sitting up position per pt   Thyroid disease 11/2014    Surgical History:  He  has a past surgical history that includes Hernia repair (Left, 2002); Carpal tunnel release (Left, 02/21/2016); Inguinal hernia repair (Left, 04/05/2017); Mass excision (Right, 04/29/2020); Cholecystectomy (N/A, 10/09/2020); Mass excision (Right, 10/09/2020); ERCP (N/A, 10/10/2020); biliary stent placement (N/A, 10/10/2020); sphincterotomy (N/A, 10/10/2020); Esophagogastroduodenoscopy (egd) with propofol (N/A, 11/09/2020); Stent removal (11/09/2020); biliary stent placement (11/09/2020); Wound debridement (11/09/2020); Endoscopic retrograde cholangiopancreatography (ercp) with propofol (N/A, 11/09/2020); Upper esophageal endoscopic ultrasound (eus) (N/A, 11/07/2020); Cyst enterostomy (N/A, 11/07/2020); biopsy (11/07/2020); Balloon dilation (N/A, 11/07/2020); biliary stent placement (11/07/2020); Esophagogastroduodenoscopy (egd) with propofol (N/A, 11/07/2020); Esophagogastroduodenoscopy (N/A, 11/11/2020); Stent removal (11/11/2020); biliary stent placement (11/11/2020); Cyst gastrostomy (11/11/2020); Esophagogastroduodenoscopy (N/A, 11/14/2020); EUS (11/14/2020); Stent removal (11/14/2020); biliary stent placement (11/14/2020); Cyst gastrostomy (11/14/2020); Esophagogastroduodenoscopy (egd) with propofol (N/A, 12/20/2020); Endoscopic retrograde cholangiopancreatography (ercp) with propofol (N/A, 12/20/2020); biopsy (12/20/2020); Stent removal (12/20/2020); Gastrointestinal stent removal (12/20/2020); Spyglass cholangioscopy (N/A, 12/20/2020); removal of stones (12/20/2020); Endoscopic retrograde cholangiopancreatography (ercp) with propofol (N/A, 01/29/2021);  biliary stent placement (N/A, 01/29/2021); Upper gastrointestinal endoscopy; ERCP; Cyst removal hand; EUS (N/A, 06/26/2021); biopsy (06/26/2021); pancreatic stent placement (06/26/2021); Esophagogastroduodenoscopy (egd) with propofol (N/A, 06/26/2021); Cystoscopy (06/26/2021); biliary stent placement (06/26/2021); Balloon dilation (N/A, 06/26/2021); Endoscopic retrograde cholangiopancreatography (ercp) with propofol (N/A, 07/24/2021); Esophagogastroduodenoscopy (egd) with propofol (N/A, 07/24/2021); Stent removal (07/24/2021); removal of stones (07/24/2021); Spyglass cholangioscopy (N/A, 07/24/2021); Biliary dilation (07/24/2021); biliary stent placement (07/24/2021); sphincterotomy (07/24/2021); ERCP (N/A, 05/22/2022); removal of stones (05/22/2022); Biliary dilation (05/22/2022); EUS (N/A, 05/22/2022); Fine needle aspiration (05/22/2022); Esophagogastroduodenoscopy (egd) with propofol (N/A, 05/22/2022); EUS (N/A, 06/16/2022); pancreatic stent placement (06/16/2022); Biliary dilation (06/16/2022); Esophagogastroduodenoscopy (egd) with propofol (N/A, 06/16/2022); Cyst gastrostomy (06/16/2022); Esophagogastroduodenoscopy (egd) with propofol (N/A, 07/30/2022); Stent removal (07/30/2022); and Splenectomy, total (N/A, 11/17/2022). Family History:  His family history includes Diabetes in his father; Heart disease in his father and maternal grandmother; Stroke in his maternal grandfather. Social History:   reports that he has been smoking cigarettes. He has a 6.5 pack-year smoking history. He has never used smokeless tobacco. He reports that he does not drink alcohol and does not use drugs.  Prior to Admission medications   Medication Sig Start Date End Date Taking? Authorizing Provider  acetaminophen (TYLENOL) 500 MG tablet Take 2 tablets (1,000 mg total) by mouth every 8 (eight) hours as needed for mild pain. 11/23/22  Yes Fritzi Mandes, MD  Continuous Blood Gluc Sensor (FREESTYLE LIBRE 3 SENSOR) MISC Place 1 sensor on  the skin every 14 days. Use to check glucose continuously e11.9 09/25/22  Yes Gottschalk, Ashly M, DO  lipase/protease/amylase (CREON) 36000 UNITS CPEP capsule Take 3 capsules with meals and 1-2  capsule with snacks ( up too 2 snacks ) Patient taking differently: Take 36,000-108,000 Units by mouth See admin instructions. Take 108,000 units by mouth with each meal eaten and 36,000-72,000 units with each snack 01/20/23  Yes Mansouraty, Netty Starring., MD  SEMGLEE, YFGN, 100 UNIT/ML Pen Inject 26 Units into the skin at bedtime. 02/22/23  Yes [provider]  traMADol (ULTRAM) 50 MG tablet Take 1 tablet (  50 mg total) by mouth every 8 (eight) hours as needed. Patient taking differently: Take 50 mg by mouth every 8 (eight) hours as needed (for pain). 01/20/23  Yes Mansouraty, Netty Starring., MD  albuterol (VENTOLIN HFA) 108 (90 Base) MCG/ACT inhaler Inhale 2 puffs into the lungs every 6 (six) hours as needed for wheezing or shortness of breath. Patient not taking: Reported on 03/28/2023 11/23/22   Rolly Salter, MD  Continuous Blood Gluc Receiver (FREESTYLE LIBRE READER) DEVI 1 Units by Does not apply route daily. UAD to test BGs daily. Dx E11.9 09/25/22   Delynn Flavin M, DO  insulin glargine (LANTUS SOLOSTAR) 100 UNIT/ML Solostar Pen Inject 26 Units into the skin daily. TO REPLACE TOUJEO Patient not taking: Reported on 03/28/2023 12/28/22   Raliegh Ip, DO  Insulin Pen Needle (BD PEN NEEDLE NANO U/F) 32G X 4 MM MISC UAD to administer insulin 09/25/22   Delynn Flavin M, DO  rosuvastatin (CRESTOR) 10 MG tablet Take 1 tablet (10 mg total) by mouth at bedtime. If you experience muscle aches take every other day Patient not taking: Reported on 03/28/2023 02/04/23   Mallipeddi, Orion Modest, MD    Current Facility-Administered Medications  Medication Dose Route Frequency Provider Last Rate Last Admin   acetaminophen (TYLENOL) tablet 650 mg  650 mg Oral Q6H PRN Bobette Mo, MD       Or    acetaminophen (TYLENOL) suppository 650 mg  650 mg Rectal Q6H PRN Bobette Mo, MD       enoxaparin (LOVENOX) injection 40 mg  40 mg Subcutaneous Q24H Bobette Mo, MD   40 mg at 03/28/23 2125   HYDROmorphone (DILAUDID) injection 1 mg  1 mg Intravenous Q2H PRN Bobette Mo, MD   1 mg at 03/29/23 0630   insulin aspart (novoLOG) injection 0-15 Units  0-15 Units Subcutaneous TID WC Bobette Mo, MD       insulin glargine-yfgn [facility]) injection 15 Units  15 Units Subcutaneous QHS Bobette Mo, MD   15 Units at 03/28/23 2125   nicotine (NICODERM CQ - dosed in mg/24 hours) patch 14 mg  14 mg Transdermal Daily PRN Bobette Mo, MD       ondansetron [facility]) tablet 4 mg  4 mg Oral Q6H PRN Bobette Mo, MD       Or   ondansetron [facility]) injection 4 mg  4 mg Intravenous Q6H PRN Bobette Mo, MD       pantoprazole (PROTONIX) injection 40 mg  40 mg Intravenous Q24H Bobette Mo, MD   40 mg at 03/28/23 1954    Allergies as of 03/28/2023 - Review Complete 03/28/2023  Allergen Reaction Noted   Desipramine Nausea Only and Rash 08/24/2012    Review of Systems:    Constitutional: No weight loss, fever, chills, weakness or fatigue HEENT: Eyes: No change in vision               Ears, Nose, Throat:  No change in hearing or congestion Skin: No rash or itching Cardiovascular: No chest pain, chest pressure or palpitations   Respiratory: No SOB or cough Gastrointestinal: See HPI and otherwise negative Genitourinary: No dysuria or change in urinary frequency Neurological: No headache, dizziness or syncope Musculoskeletal: No new muscle or joint pain Hematologic: No bleeding or bruising Psychiatric: No history of depression or anxiety     Physical Exam:  Vital signs in last 24 hours: Temp:  [97.6 F (36.4 C)-98.4 F (  36.9 C)] 97.7 F (36.5 C) (09/23 0630) Pulse Rate:  [54-79] 54 (09/22 2104) Resp:  [14-18] 18 (09/23 0630) BP:  (104-133)/(70-87) 128/87 (09/23 0630) SpO2:  [96 %-100 %] 100 % (09/23 0630) Weight:  [80.3 kg] 80.3 kg (09/22 0955) Last BM Date : 03/28/23 Last BM recorded by nurses in past 5 days No data recorded  General:   Pleasant male in no acute distress Head:  Normocephalic and atraumatic. Eyes: sclerae anicteric,conjunctive pink  Heart:  regular rate and rhythm, no murmurs or gallops Pulm: Clear anteriorly; no wheezing Abdomen:  Soft, Non-distended AB, Active bowel sounds. mild tenderness in the epigastrium. Without guarding and Without rebound, well-healed epigastric vertical scar, possible ventral hernia/scar tissue/lipomas along epigastric upper abdomen Extremities:  Without 1632 edema. Msk:  Symmetrical without gross deformities. Peripheral pulses intact.  Neurologic:  Alert and  oriented x4;  No focal deficits.  Skin:   Dry and intact without significant lesions or rashes. Psychiatric:  Cooperative. Normal mood and affect.  LAB RESULTS: Recent Labs    03/28/23 1140 03/29/23 0430  WBC 10.1 10.5  HGB 16.2 15.2  HCT 48.4 46.0  PLT 453* 451*   BMET Recent Labs    03/28/23 1140 03/29/23 0430  NA 132* 136  K 4.3 4.7  CL 96* 99  CO2 24 27  GLUCOSE 473* 232*  BUN 11 11  CREATININE 0.97 0.68  CALCIUM 9.3 9.2   LFT Recent Labs    03/29/23 0430  PROT 6.4*  ALBUMIN 3.4*  AST 33  ALT 55*  ALKPHOS 417*  BILITOT 0.7   PT/INR No results for input(s): "LABPROT", "INR" in the last 72 hours.  STUDIES: CT ABDOMEN PELVIS W CONTRAST  Result Date: 03/28/2023 CLINICAL DATA:  Epigastric pain as suspect pancreatitis. Previous partial pancreatectomy and splenectomy. EXAM: CT ABDOMEN AND PELVIS WITH CONTRAST TECHNIQUE: Multidetector CT imaging of the abdomen and pelvis was performed using the standard protocol following bolus administration of intravenous contrast. RADIATION DOSE REDUCTION: This exam was performed according to the departmental dose-optimization program which includes  automated exposure control, adjustment of the mA and/or kV according to patient size and/or use of iterative reconstruction technique. CONTRAST:  OMNIPAQUE IOHEXOL 300 MG/ML  SOLN COMPARISON:  02/12/2023 FINDINGS: Lower chest: Suggestion mild emphysematous disease. No focal airspace consolidation or effusion. Heart is normal size. Hepatobiliary: Previous cholecystectomy. No evidence of liver mass. There is dilatation of the common bile duct and central intrahepatic ducts without significant change. Pancreas: Evidence of patient's previous partial pancreatectomy. Again noted is a rim of pancreas over the pancreatic head unchanged. There appears to be a small remnant of pancreatic tissue over the distal body of the pancreas containing a segment of main pancreatic duct measuring approximately 5-6 mm (image [id number]) as this is unchanged. There is persistent fluid and mild fat stranding over the pancreatic surgical bed which may be slightly worse as cannot exclude active pancreatitis. There is a new 3.3 cm oval cystic structure abutting the greater curvature of the stomach. Slight interval decrease in size of a 1.9 x 2.1 cm density abutting the gastric fundus. These findings may represent persistent pancreatic pseudocysts versus other infected versus noninfected postsurgical collections. Spleen: Prior splenectomy. Adrenals/Urinary Tract: Adrenal glands are normal. Kidneys are normal size without hydronephrosis or nephrolithiasis. Ureters and bladder are normal. Stomach/Bowel: Stomach as described. Mild mucosal enhancement and wall thickening involving the duodenum and very proximal jejunum. Mid to distal small bowel is normal. Appendix is normal. Mild fecal retention throughout the  colon which is otherwise unremarkable. Vascular/Lymphatic: Abdominal aorta is normal caliber. Remaining vascular structures are unremarkable. No adenopathy. Reproductive: Normal. Other: None. Musculoskeletal: No focal abnormality.  IMPRESSION: 1. Evidence of patient's previous partial pancreatectomy and splenectomy. Residual pancreatic tissue over the pancreatic head and also over the expected distal body of the pancreas. Persistent fluid and mild fat stranding over the pancreatic surgical bed which may be slightly worse and may represent active pancreatitis. New 3.3 cm oval cystic structure abutting the greater curvature of the stomach. Slight interval decrease in size of a 1.9 x 2.1 cm density abutting the gastric fundus. These findings may represent persistent pancreatic pseudocysts versus other infected versus noninfected postsurgical collections. 2. Mild mucosal enhancement and wall thickening involving the duodenum and very proximal jejunum which may be secondary to adjacent pancreatic inflammatory process. 3. Previous cholecystectomy with stable dilatation of the common bile duct and central intrahepatic ducts. 4. Mild fecal retention throughout the colon. 5. Suggestion of mild emphysematous disease. Emphysema (ICD10-J43.9). Electronically Signed   By: Elberta Fortis M.D.   On: 03/28/2023 13:44      Impression    Epigastric pain, nausea and history of complicated necrotizing pancreatitis in setting of gallstone/post ERCP with biliary obstruction and recurrent pseudocyst ultimately resulting in distal pancreatectomy and splenectomy due to PD disruption 11/14/2022 CT abdomen pelvis shows persistent fluid mild fat stranding of her pancreatic surgical bed slightly worse may represent acute pancreatitis New 3.3 cm oval cystic structure abutting greater curvature of stomach interval decrease in size of 1.9 x 2.1 cm density abutting gastric fundus marrow present pancreatic pseudocyst versus postsurgical collections. Lipase 99 AST 33 ALT 55 Alkphos 417 TBili 0.7 Afebrile, WBC 10.1 no leukocytosis-no signs of infection  Pancreatic fluid collections post distal pancreatectomy and splenectomy Afebrile, WBC 10.1 no leukocytosis-no signs  of infection  Small mucosal enhancement wall thickening involving duodenum and proximal jejunum EGD 07/30/2022 with nonbleeding gastric ulcer clean base, normal duodenum at that time Most likely secondary to potentially pancreatitis History of gastric ulcer  EPI CT with stool burden possibly secondary to tramadol/pain medication use  Type 2 diabetes with hyperglycemia A1c 8.3 on 6/25 Continue per primary  Tobacco use/COPD Currently has nicotine patch Potentially contributing to recurrent pancreatitis  Thrombocytosis  likely secondary to inflammation  Principal Problem:   Acute on chronic pancreatitis (HCC) Active Problems:   Essential hypertension   Hyperlipidemia   Biliary stricture   Tobacco use disorder   Pancreatic pseudocyst   COPD (chronic obstructive pulmonary disease) (HCC)   History of pancreatectomy    LOS: 0 days     Plan   Difficult situation, possible continuing acute on chronic pancreatitis post distal pancreatomy and splenectomy, potential new pseudocyst versus surgical fluid collections.  May need to consider ERCP/EUS in the future based off imaging and increasing alk phos, will discuss further with our endoscopy specialist, will continue supportive care in the meantime.  - consider EGD to evaluate for GOO -Smoking cessation counseling, continue nicotine patch -Pain control, fluid replacement with LR, early ambulation.  -Advance diet as tolerated.  -Trend CBC, CMP -VTE prophylaxis. -Protonix 40 mg IV twice daily -Consider MiraLAX -Will follow along.  Thank you for your kind consultation, we will continue to follow.   Doree Albee  03/29/2023, 8:25 AM    [facility] Attending   I have taken an interval history, reviewed the chart and examined the patient. I agree with the Advanced Practitioner's note, impression and recommendations with the following additions:  Very complicated patient s/p distal pancreatectomy and admitted w/ increased  pain + nausea and vomiting. He has been losing weight despite eating well and taking pancreatic enzymes. Has peripancreatic fluid collections/pseudocysts. Cause of alkaline phosphatase elevation not clear - no clear biliary obstruction seen thus far but he does have stable extrahepatic dilation, s/p chole.Marland Kitchen  Poorly controlled diabetes is one possible cause of weight loss and is plausible.  Will start evaluation with an EGD to outline internal UGI anatomy and see if there are any stenoses or strictures causing problems.  The risks and benefits as well as alternatives of endoscopic procedure(s) have been discussed and reviewed. All questions answered. The patient agrees to proceed.   Iva Boop, MD, [facility] See Loretha Stapler on call - gastroenterology for best contact person 03/29/2023 3:57 PM

## 2023-03-29 NOTE — Plan of Care (Signed)
Problem: Cardiac: Goal: Ability to maintain an adequate cardiac output will improve Outcome: Progressing   Problem: Health Behavior/Discharge Planning: Goal: Ability to identify and utilize available resources and services will improve Outcome: Progressing Goal: Ability to manage health-related needs will improve Outcome: Progressing

## 2023-03-29 NOTE — Progress Notes (Signed)
   03/29/23 0959  TOC Brief Assessment  Insurance and Status Reviewed  Patient has primary care physician Yes  Home environment has been reviewed Resides alone  Prior level of function: Independent at baseline  Prior/Current Home Services No current home services  Social Determinants of Health Reivew SDOH reviewed no interventions necessary  Readmission risk has been reviewed Yes  Transition of care needs no transition of care needs at this time

## 2023-03-29 NOTE — Progress Notes (Signed)
PROGRESS NOTE Steve Romero  ZOX:[id number] DOB: July 29, 1970 DOA: 03/28/2023 PCP: Raliegh Ip, DO  Brief Narrative/Hospital Course: 52 year old male with chronic pancreatitis, anxiety/depression/bipolar disorder/seizure affective disorder, COPD, BPH, HLD, HTN, sleep apnea not on CPAP presented with abdominal pain, nausea and 3 episodes of vomiting/yellowish since 9/31, was seen in the ED 9/22. Labs showed ketones glucose in the urine WBC stable lipase 99 CMP with glucose 473 alk phos elevated at 456 CT abdomen/pelvis with contrast >> previous partial pancreatectomy with residual pancreatic tissue showing persistent fluid and mild stranding over the surgical bed is likely a recurrence of pancreatitis. New 3.3 cm oval cystic structure abutting the greater curvature of the stomach. Slight interval decrease in size of a 1.9 x 2.1 cm density abutting the gastric fundus. These findings may represent persistent pancreatic pseudocysts versus other infected versus noninfected postsurgical collections. Also mild mucosal enhancement and and very proximal jejunum may be secondary to adjacent pancreatic inflammatory changes. Previous cholecystectomy with stable dilatation of the CBD mild fecal retention throughout the colon.  GI consulted admitted for further management   Subjective: Seen and examined AAOX3, RESTING. SOM ABDOMEN PAIN WANTS TO EAT TODAY   Assessment and Plan: Principal Problem:   Acute on chronic pancreatitis (HCC) Active Problems:   Pancreatic pseudocyst   Essential hypertension   Hyperlipidemia   Biliary stricture   Tobacco use disorder   COPD (chronic obstructive pulmonary disease) (HCC)   History of pancreatectomy  Acute on chronic pancreatitis Cystic structure/collection indicating persistent pancreatic pseudocyst versus other infected versus noninfected postsurgical collection: Fortunately does not have any fever or leukocytosis or signs of infection.  Managing for  acute on chronic pancreatitis with pain medication PPI, aggressive IV fluid hydration.  GI has been consulted, remains n.p.o. hopefully can start diet soon.  Diabetes mellitus with uncontrolled hyperglycemia: Last HbA1c 8.3 on June 2024, update A1c, continue 15 units bedtime Semglee ( at home on 26 units qhs)  cont SSI  Hypertension HLD: Holding statin, monitor BP and continue as needed meds.  Not p.o. meds at home  COPD Tobacco use: Continue bronchodilators, cessation counseling and nicotine patch.  DVT prophylaxis: enoxaparin (LOVENOX) injection 40 mg Start: 03/28/23 2200 Code Status:   Code Status: Full Code Family Communication: plan of care discussed with patient at bedside. Patient status is:  admitted as observation but remains hospitalized for ongoing  because of abdomeen pain Level of care: Med-Surg   Dispo: The patient is from: home            Anticipated disposition: home 2 days  Objective: Vitals last 24 hrs: Vitals:   03/28/23 1708 03/28/23 2104 03/29/23 0630 03/29/23 0921  BP: 113/81 104/70 128/87 104/73  Pulse: (!) 55 (!) 54  (!) 50  Resp: 18 18 18    Temp: 97.6 F (36.4 C) 97.6 F (36.4 C) 97.7 F (36.5 C) 98 F (36.7 C)  TempSrc: Oral Oral Oral Oral  SpO2: 96% 98% 100% 99%  Weight:      Height:       Weight change:   Physical Examination: General exam: alert awake, older than stated age HEENT:Oral mucosa moist, Ear/Nose WNL grossly Respiratory system: bilaterally clear BS, no use of accessory muscle Cardiovascular system: S1 & S2 +, No JVD. Gastrointestinal system: Abdomen soft, scars+, mildly tender, ND, BS+ Nervous System:Alert, awake, moving extremities. Extremities: LE edema neg,distal peripheral pulses palpable.  Skin: No rashes,no icterus. MSK: Normal muscle bulk,tone, power  Medications reviewed:  Scheduled Meds:  enoxaparin (LOVENOX) injection  40 mg Subcutaneous Q24H   insulin aspart  0-15 Units Subcutaneous TID WC   insulin  glargine-yfgn  15 Units Subcutaneous QHS   pantoprazole (PROTONIX) IV  40 mg Intravenous Q24H   Continuous Infusions:  lactated ringers 125 mL/hr at 03/29/23 9562      Diet Order             Diet clear liquid Room service appropriate? Yes; Fluid consistency: Thin  Diet effective now                            Intake/Output Summary (Last 24 hours) at 03/29/2023 0954 Last data filed at 03/29/2023 0523 Gross per 24 hour  Intake 1289.2 ml  Output 100 ml  Net 1189.2 ml   Net IO Since Admission: 1,189.2 mL [03/29/23 0954]  Wt Readings from Last 3 Encounters:  03/28/23 80.3 kg  02/03/23 88.1 kg  01/20/23 88.9 kg     Unresulted Labs (From admission, onward)     Start     Ordered   03/30/23 0500  Hemoglobin A1c  Tomorrow morning,   R        03/29/23 0902          Data Reviewed: I have personally reviewed following labs and imaging studies CBC: Recent Labs  Lab 03/28/23 1140 03/29/23 0430  WBC 10.1 10.5  NEUTROABS 6.6  --   HGB 16.2 15.2  HCT 48.4 46.0  MCV 83.3 85.5  PLT 453* 451*   Basic Metabolic Panel: Recent Labs  Lab 03/28/23 1140 03/29/23 0430  NA 132* 136  K 4.3 4.7  CL 96* 99  CO2 24 27  GLUCOSE 473* 232*  BUN 11 11  CREATININE 0.97 0.68  CALCIUM 9.3 9.2  GFR Estimated Creatinine Clearance: 108 mL/min (by C-G formula based on SCr of 0.68 mg/dL). Liver Function Tests: Recent Labs  Lab 03/28/23 1140 03/29/23 0430  AST 20 33  ALT 59* 55*  ALKPHOS 456* 417*  BILITOT 0.7 0.7  PROT 7.3 6.4*  ALBUMIN 4.0 3.4*   Recent Labs  Lab 03/28/23 1140  LIPASE 99*  CBG: Recent Labs  Lab 03/28/23 2102 03/29/23 0741  GLUCAP 262* 235*  Antimicrobials: Anti-infectives (From admission, onward)    None      Culture/Microbiology    Component Value Date/Time   SDES  06/16/2022 1458    CYSTS Performed at [facility], [address], [place], [place] [address]    SPECREQUEST PANCREATIC CYST 06/16/2022 1458   CULT   06/16/2022 1458    NO GROWTH 3 DAYS Performed at [facility], [address], [place], [place] [address]    REPTSTATUS 06/19/2022 FINAL 06/16/2022 1458    Radiology Studies: CT ABDOMEN PELVIS W CONTRAST  Result Date: 03/28/2023 CLINICAL DATA:  Epigastric pain as suspect pancreatitis. Previous partial pancreatectomy and splenectomy. EXAM: CT ABDOMEN AND PELVIS WITH CONTRAST TECHNIQUE: Multidetector CT imaging of the abdomen and pelvis was performed using the standard protocol following bolus administration of intravenous contrast. RADIATION DOSE REDUCTION: This exam was performed according to the departmental dose-optimization program which includes automated exposure control, adjustment of the mA and/or kV according to patient size and/or use of iterative reconstruction technique. CONTRAST:  OMNIPAQUE IOHEXOL 300 MG/ML  SOLN COMPARISON:  02/12/2023 FINDINGS: Lower chest: Suggestion mild emphysematous disease. No focal airspace consolidation or effusion. Heart is normal size. Hepatobiliary: Previous cholecystectomy. No evidence of liver mass. There is dilatation of  the common bile duct and central intrahepatic ducts without significant change. Pancreas: Evidence of patient's previous partial pancreatectomy. Again noted is a rim of pancreas over the pancreatic head unchanged. There appears to be a small remnant of pancreatic tissue over the distal body of the pancreas containing a segment of main pancreatic duct measuring approximately 5-6 mm (image 23-27) as this is unchanged. There is persistent fluid and mild fat stranding over the pancreatic surgical bed which may be slightly worse as cannot exclude active pancreatitis. There is a new 3.3 cm oval cystic structure abutting the greater curvature of the stomach. Slight interval decrease in size of a 1.9 x 2.1 cm density abutting the gastric fundus. These findings may represent persistent pancreatic pseudocysts versus other infected versus  noninfected postsurgical collections. Spleen: Prior splenectomy. Adrenals/Urinary Tract: Adrenal glands are normal. Kidneys are normal size without hydronephrosis or nephrolithiasis. Ureters and bladder are normal. Stomach/Bowel: Stomach as described. Mild mucosal enhancement and wall thickening involving the duodenum and very proximal jejunum. Mid to distal small bowel is normal. Appendix is normal. Mild fecal retention throughout the colon which is otherwise unremarkable. Vascular/Lymphatic: Abdominal aorta is normal caliber. Remaining vascular structures are unremarkable. No adenopathy. Reproductive: Normal. Other: None. Musculoskeletal: No focal abnormality. IMPRESSION: 1. Evidence of patient's previous partial pancreatectomy and splenectomy. Residual pancreatic tissue over the pancreatic head and also over the expected distal body of the pancreas. Persistent fluid and mild fat stranding over the pancreatic surgical bed which may be slightly worse and may represent active pancreatitis. New 3.3 cm oval cystic structure abutting the greater curvature of the stomach. Slight interval decrease in size of a 1.9 x 2.1 cm density abutting the gastric fundus. These findings may represent persistent pancreatic pseudocysts versus other infected versus noninfected postsurgical collections. 2. Mild mucosal enhancement and wall thickening involving the duodenum and very proximal jejunum which may be secondary to adjacent pancreatic inflammatory process. 3. Previous cholecystectomy with stable dilatation of the common bile duct and central intrahepatic ducts. 4. Mild fecal retention throughout the colon. 5. Suggestion of mild emphysematous disease. Emphysema (ICD10-J43.9). Electronically Signed   By: Elberta Fortis M.D.   On: 03/28/2023 13:44    LOS: 0 days   Lanae Boast, MD [facility]  03/29/2023, 9:54 AM

## 2023-03-29 NOTE — Hospital Course (Addendum)
52 year old male with chronic pancreatitis, anxiety/depression/bipolar disorder/seizure affective disorder, COPD, BPH, HLD, HTN, sleep apnea not on CPAP presented with abdominal pain, nausea and 3 episodes of vomiting/yellowish since 9/31, was seen in the ED 9/22. Labs showed ketones glucose in the urine WBC stable lipase 99 CMP with glucose 473 alk phos elevated at 456 CT abdomen/pelvis with contrast >> previous partial pancreatectomy with residual pancreatic tissue showing persistent fluid and mild stranding over the surgical bed is likely a recurrence of pancreatitis. New 3.3 cm oval cystic structure abutting the greater curvature of the stomach. Slight interval decrease in size of a 1.9 x 2.1 cm density abutting the gastric fundus. These findings may represent persistent pancreatic pseudocysts versus other infected versus noninfected postsurgical collections. Also mild mucosal enhancement and and very proximal jejunum may be secondary to adjacent pancreatic inflammatory changes. Previous cholecystectomy with stable dilatation of the CBD mild fecal retention throughout the colon.  GI consulted admitted for further management 9/23: Per GI patient is complicated with status post distal pancreatectomy-admitted with increased pain nausea vomiting and weight loss, with imaging showing peripancreatic fluid collection/pseudocyst and elevated alk phos-no clear biliary obstruction seen> EGD recommended to obtain UGI anatomy to look for any stenosis or strictures causing problems.  9/24: EGD yesterday without any significant abnormality.  Continue to have some pain but controlled with oxycodone.  No nausea or vomiting and tolerating soft diet well. Patient is being discharged on some oxycodone to be used only as needed for severe pain. He will continue his home medications including Creon.  CBG seems well-controlled with Semglee at a lower dose than home, significantly elevated A1c of 14.7, seems like  noncompliance.  Patient was instructed to follow-up closely with his PCP for better control of diabetes.  PCP can refer to see an endocrinologist if needed.  Patient will follow-up with GI as outpatient for further recommendations.    Patient will continue on current medications and need to have a close follow-up with his providers for further recommendations.

## 2023-03-29 NOTE — H&P (View-Only) (Signed)
Consultation  Referring Provider:   [facility] Primary Care Physician:  Raliegh Ip, DO Primary Gastroenterologist:  Dr. Meridee Score       Reason for Consultation:   Epigastric pain in setting of recurrent pancreatitis/duct disruption s/p distal pancreatomy and splenectomy  DOA: 03/28/2023         Hospital Day: 2         HPI:   Steve Romero is a 52 y.o. 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 Freida Busman, EPI, chronic abdominal discomfort, colon polyps (TAs) .   Presents to the ER with epigastric discomfort, nausea, diarrhea.  Work up notable for  WBC 10.1, lipase 99,  Glucose 473, urinalysis glucose greater than 500, ketones 20 ALT 59, alk phos 456 CT abdomen pelvis with contrast shows previous partial pancreatectomy and splenectomy residual pancreatic tissue pancreatic head with persistent fluid and mild fat stranding over the pancreatic surgical bed may be slightly worse may represent acute pancreatitis.  New 3.3 cm oval cystic structure abutting the greater curvature of the stomach, slight interval decrease in size of 1.9 x 2.1 cm density abutting gastric fundus may represent pancreatic pseudocyst versus postsurgical collections infected versus noninfected.  Mild mucosal enhancement and wall thickening duodenum and very proximal jejunum potentially secondary to pancreatic inflammatory processes.  Previous cholecystectomy with stable dilation of the common bile duct and central intrahepatic ducts.  Mild fecal retention throughout the colon.  No family was present at the time of my evaluation. Patient states since his  surgery in May has had 20 pound weight loss.   Feels like he is eating well at home until the past 2 or 3 days he had nausea with vomiting and unable to hold anything down. States he continues to have epigastric/ left upper quadrant abdominal discomfort with radiation to his back even after the surgery though slightly different than prior.   Can occasionally be worse with eating but cannot identify trigger foods.   Abdominal discomfort can be worse with lying down. He does have belching and feels as if air can get trapped but denies reflux or dysphagia. He has had yellow stools recently more loose stools 2 times a day last 1 was this morning more liquid/solid.  Denies melena or hematochezia. Patient denies fever, chills.  Denies shortness of breath or chest pain. Patient normally takes tramadol 2 to 3 days/week and Tylenol as needed denies NSAIDs. Patient does still smoke 4 cigarettes daily but denies any alcohol or drug use. Denies any recent supplements/OTC/new medications.   Previous GI procedures  EGD 07/30/22 for AXIOS stent removal - No gross lesions in the entire esophagus. - Z-line irregular, 41 cm from the incisors. - Pre-existing cystgastrostomy stents were found and removed. - Pseudocyst cavity entered and shows healthy viable tissue present. - Non-bleeding gastric ulcer with a clean ulcer base (Forrest Class III) from the previous stents. - No gross lesions in the duodenal bulb, in the first portion of the duodenum and in the second portion of the duodenum.   December 2023 upper EUS EGD Impression:                           - LA Grade C erosive esophagitis with  no bleeding.                           - Z-line irregular, 41 cm from the incisors.                           - Extrinsic compression in the gastric antrum.                           - Erythematous mucosa in the stomach.                           - No gross lesions in the duodenal bulb, in the                             first portion of the duodenum and in the second                            portion of the duodenum (only seen after EUS                            completed).  EUS Impression:                           - A cystic lesion was seen in the genu of the                            pancreas and pancreatic body. Tissue has not been                            obtained. However, the endosonographic appearance                            is consistent with a pancreatic pseudocyst. AXIOS                            cystgastrostomy created. Dilated tract. Aspiration                            of 500 mL of fluid.                           - Pancreatic parenchymal abnormalities consisting                            of atrophy were noted in the pancreatic body and                            pancreatic tail.                           - The pancreatic duct had a dilated endosonographic                            appearance  in the body of the pancreas and tail of                            the pancreas.                           - Cystogastrostomy was performed.   November 2023 EUS EGD Impression:                           - No gross lesions in the entire esophagus. Z-line                            irregular, 41 cm from the incisors.                           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                           - Extrinsic impression/compression in the gastric                            antrum.                           - Needed pediatric colonoscope to traverse into the                            duodenum.                           - No gross lesions in the duodenal bulb, in the                            first portion of the duodenum and in the second                            portion of the duodenum. EUS Impression:                           - A cystic lesion was seen in the genu of the                            pancreas. Tissue has not been obtained. However,                             the endosonographic appearance is consistent with a                            pancreatic pseudocyst. Decision made due to the                            small size to perform an aspiration rather than  cystgastrostomy. Fine needle aspiration for fluid                            performed.                           - Pancreatic parenchymal abnormalities consisting                            of atrophy, lobularity and hyperechoic strands were                            noted in the entire pancreas.                           - The pancreatic duct had a dilated endosonographic                            appearance in the genu of the pancreas and body of                            the pancreas. The pancreatic duct measured up to 5                            mm in diameter in these regions.                           - There was dilation in the common bile duct and in                            the common hepatic duct which measured up to 14 mm.                           - No malignant-appearing lymph nodes were                            visualized in the celiac region (level 20),                            peripancreatic region and porta hepatis region.   November 2023 ERCP - The major papilla appeared edematous. Prior                            biliary sphincterotomy appeared open but was                            slightly narrowed.                           - The fluoroscopic examination was suspicious for                            sludge.                           -  Distal narrowing noted in the duct. Balloon                            sphincteroplasty performed.                           - The biliary tree was swept and sludge was found.                           - An irregularity was found in the ventral                            pancreatic duct in the head of the pancreas and I                            could not traverse deeply into the proximal                             pancreatic duct - I am concerned as I had been                            previously of duct disruption (failed prior                            pancreatic evaluation at [facility] as well in the past).  Abnormal ED labs: Abnormal Labs Reviewed  COMPREHENSIVE METABOLIC PANEL - Abnormal; Notable for the following components:      Result Value   Sodium 132 (*)    Chloride 96 (*)    Glucose, Bld 473 (*)    ALT 59 (*)    Alkaline Phosphatase 456 (*)    All other components within normal limits  LIPASE, BLOOD - Abnormal; Notable for the following components:   Lipase 99 (*)    All other components within normal limits  CBC WITH DIFFERENTIAL/PLATELET - Abnormal; Notable for the following components:   Platelets 453 (*)    All other components within normal limits  URINALYSIS, ROUTINE W REFLEX MICROSCOPIC - Abnormal; Notable for the following components:   Color, Urine STRAW (*)    Specific Gravity, Urine 1.032 (*)    Glucose, UA >=500 (*)    Ketones, ur 20 (*)    All other components within normal limits  COMPREHENSIVE METABOLIC PANEL - Abnormal; Notable for the following components:   Glucose, Bld 232 (*)    Total Protein 6.4 (*)    Albumin 3.4 (*)    ALT 55 (*)    Alkaline Phosphatase 417 (*)    All other components within normal limits  CBC - Abnormal; Notable for the following components:   Platelets 451 (*)    All other components within normal limits  GLUCOSE, CAPILLARY - Abnormal; Notable for the following components:   Glucose-Capillary 262 (*)    All other components within normal limits  GLUCOSE, CAPILLARY - Abnormal; Notable for the following components:   Glucose-Capillary 235 (*)    All other components within normal limits    Past Medical History:  Diagnosis Date   Acute pancreatitis without necrosis or infection, unspecified    AKI (acute kidney injury) (HCC) 05/17/2022  Anxiety 10/2014   Ascites    Bipolar disorder [facility]) age 26   COPD  (chronic obstructive pulmonary disease) (HCC) 2016   Depression age 31   Enlarged prostate    Hyperlipidemia 2016   Hypertension 2013   Nausea vomiting and diarrhea 06/15/2022   Schizoaffective disorder (HCC) 11/13/2014   Sleep apnea    Does not wear c-pap, sleeps in sitting up position per pt   Thyroid disease 11/2014    Surgical History:  He  has a past surgical history that includes Hernia repair (Left, 2002); Carpal tunnel release (Left, 02/21/2016); Inguinal hernia repair (Left, 04/05/2017); Mass excision (Right, 04/29/2020); Cholecystectomy (N/A, 10/09/2020); Mass excision (Right, 10/09/2020); ERCP (N/A, 10/10/2020); biliary stent placement (N/A, 10/10/2020); sphincterotomy (N/A, 10/10/2020); Esophagogastroduodenoscopy (egd) with propofol (N/A, 11/09/2020); Stent removal (11/09/2020); biliary stent placement (11/09/2020); Wound debridement (11/09/2020); Endoscopic retrograde cholangiopancreatography (ercp) with propofol (N/A, 11/09/2020); Upper esophageal endoscopic ultrasound (eus) (N/A, 11/07/2020); Cyst enterostomy (N/A, 11/07/2020); biopsy (11/07/2020); Balloon dilation (N/A, 11/07/2020); biliary stent placement (11/07/2020); Esophagogastroduodenoscopy (egd) with propofol (N/A, 11/07/2020); Esophagogastroduodenoscopy (N/A, 11/11/2020); Stent removal (11/11/2020); biliary stent placement (11/11/2020); Cyst gastrostomy (11/11/2020); Esophagogastroduodenoscopy (N/A, 11/14/2020); EUS (11/14/2020); Stent removal (11/14/2020); biliary stent placement (11/14/2020); Cyst gastrostomy (11/14/2020); Esophagogastroduodenoscopy (egd) with propofol (N/A, 12/20/2020); Endoscopic retrograde cholangiopancreatography (ercp) with propofol (N/A, 12/20/2020); biopsy (12/20/2020); Stent removal (12/20/2020); Gastrointestinal stent removal (12/20/2020); Spyglass cholangioscopy (N/A, 12/20/2020); removal of stones (12/20/2020); Endoscopic retrograde cholangiopancreatography (ercp) with propofol (N/A, 01/29/2021);  biliary stent placement (N/A, 01/29/2021); Upper gastrointestinal endoscopy; ERCP; Cyst removal hand; EUS (N/A, 06/26/2021); biopsy (06/26/2021); pancreatic stent placement (06/26/2021); Esophagogastroduodenoscopy (egd) with propofol (N/A, 06/26/2021); Cystoscopy (06/26/2021); biliary stent placement (06/26/2021); Balloon dilation (N/A, 06/26/2021); Endoscopic retrograde cholangiopancreatography (ercp) with propofol (N/A, 07/24/2021); Esophagogastroduodenoscopy (egd) with propofol (N/A, 07/24/2021); Stent removal (07/24/2021); removal of stones (07/24/2021); Spyglass cholangioscopy (N/A, 07/24/2021); Biliary dilation (07/24/2021); biliary stent placement (07/24/2021); sphincterotomy (07/24/2021); ERCP (N/A, 05/22/2022); removal of stones (05/22/2022); Biliary dilation (05/22/2022); EUS (N/A, 05/22/2022); Fine needle aspiration (05/22/2022); Esophagogastroduodenoscopy (egd) with propofol (N/A, 05/22/2022); EUS (N/A, 06/16/2022); pancreatic stent placement (06/16/2022); Biliary dilation (06/16/2022); Esophagogastroduodenoscopy (egd) with propofol (N/A, 06/16/2022); Cyst gastrostomy (06/16/2022); Esophagogastroduodenoscopy (egd) with propofol (N/A, 07/30/2022); Stent removal (07/30/2022); and Splenectomy, total (N/A, 11/17/2022). Family History:  His family history includes Diabetes in his father; Heart disease in his father and maternal grandmother; Stroke in his maternal grandfather. Social History:   reports that he has been smoking cigarettes. He has a 6.5 pack-year smoking history. He has never used smokeless tobacco. He reports that he does not drink alcohol and does not use drugs.  Prior to Admission medications   Medication Sig Start Date End Date Taking? Authorizing Provider  acetaminophen (TYLENOL) 500 MG tablet Take 2 tablets (1,000 mg total) by mouth every 8 (eight) hours as needed for mild pain. 11/23/22  Yes Fritzi Mandes, MD  Continuous Blood Gluc Sensor (FREESTYLE LIBRE 3 SENSOR) MISC Place 1 sensor on  the skin every 14 days. Use to check glucose continuously e11.9 09/25/22  Yes Gottschalk, Ashly M, DO  lipase/protease/amylase (CREON) 36000 UNITS CPEP capsule Take 3 capsules with meals and 1-2  capsule with snacks ( up too 2 snacks ) Patient taking differently: Take 36,000-108,000 Units by mouth See admin instructions. Take 108,000 units by mouth with each meal eaten and 36,000-72,000 units with each snack 01/20/23  Yes Mansouraty, Netty Starring., MD  SEMGLEE, YFGN, 100 UNIT/ML Pen Inject 26 Units into the skin at bedtime. 02/22/23  Yes [provider]  traMADol (ULTRAM) 50 MG tablet Take 1 tablet (  50 mg total) by mouth every 8 (eight) hours as needed. Patient taking differently: Take 50 mg by mouth every 8 (eight) hours as needed (for pain). 01/20/23  Yes Mansouraty, Netty Starring., MD  albuterol (VENTOLIN HFA) 108 (90 Base) MCG/ACT inhaler Inhale 2 puffs into the lungs every 6 (six) hours as needed for wheezing or shortness of breath. Patient not taking: Reported on 03/28/2023 11/23/22   Rolly Salter, MD  Continuous Blood Gluc Receiver (FREESTYLE LIBRE READER) DEVI 1 Units by Does not apply route daily. UAD to test BGs daily. Dx E11.9 09/25/22   Delynn Flavin M, DO  insulin glargine (LANTUS SOLOSTAR) 100 UNIT/ML Solostar Pen Inject 26 Units into the skin daily. TO REPLACE TOUJEO Patient not taking: Reported on 03/28/2023 12/28/22   Raliegh Ip, DO  Insulin Pen Needle (BD PEN NEEDLE NANO U/F) 32G X 4 MM MISC UAD to administer insulin 09/25/22   Delynn Flavin M, DO  rosuvastatin (CRESTOR) 10 MG tablet Take 1 tablet (10 mg total) by mouth at bedtime. If you experience muscle aches take every other day Patient not taking: Reported on 03/28/2023 02/04/23   Mallipeddi, Orion Modest, MD    Current Facility-Administered Medications  Medication Dose Route Frequency Provider Last Rate Last Admin   acetaminophen (TYLENOL) tablet 650 mg  650 mg Oral Q6H PRN Bobette Mo, MD       Or    acetaminophen (TYLENOL) suppository 650 mg  650 mg Rectal Q6H PRN Bobette Mo, MD       enoxaparin (LOVENOX) injection 40 mg  40 mg Subcutaneous Q24H Bobette Mo, MD   40 mg at 03/28/23 2125   HYDROmorphone (DILAUDID) injection 1 mg  1 mg Intravenous Q2H PRN Bobette Mo, MD   1 mg at 03/29/23 0630   insulin aspart (novoLOG) injection 0-15 Units  0-15 Units Subcutaneous TID WC Bobette Mo, MD       insulin glargine-yfgn [facility]) injection 15 Units  15 Units Subcutaneous QHS Bobette Mo, MD   15 Units at 03/28/23 2125   nicotine (NICODERM CQ - dosed in mg/24 hours) patch 14 mg  14 mg Transdermal Daily PRN Bobette Mo, MD       ondansetron [facility]) tablet 4 mg  4 mg Oral Q6H PRN Bobette Mo, MD       Or   ondansetron [facility]) injection 4 mg  4 mg Intravenous Q6H PRN Bobette Mo, MD       pantoprazole (PROTONIX) injection 40 mg  40 mg Intravenous Q24H Bobette Mo, MD   40 mg at 03/28/23 1954    Allergies as of 03/28/2023 - Review Complete 03/28/2023  Allergen Reaction Noted   Desipramine Nausea Only and Rash 08/24/2012    Review of Systems:    Constitutional: No weight loss, fever, chills, weakness or fatigue HEENT: Eyes: No change in vision               Ears, Nose, Throat:  No change in hearing or congestion Skin: No rash or itching Cardiovascular: No chest pain, chest pressure or palpitations   Respiratory: No SOB or cough Gastrointestinal: See HPI and otherwise negative Genitourinary: No dysuria or change in urinary frequency Neurological: No headache, dizziness or syncope Musculoskeletal: No new muscle or joint pain Hematologic: No bleeding or bruising Psychiatric: No history of depression or anxiety     Physical Exam:  Vital signs in last 24 hours: Temp:  [97.6 F (36.4 C)-98.4 F (  36.9 C)] 97.7 F (36.5 C) (09/23 0630) Pulse Rate:  [54-79] 54 (09/22 2104) Resp:  [14-18] 18 (09/23 0630) BP:  (104-133)/(70-87) 128/87 (09/23 0630) SpO2:  [96 %-100 %] 100 % (09/23 0630) Weight:  [80.3 kg] 80.3 kg (09/22 0955) Last BM Date : 03/28/23 Last BM recorded by nurses in past 5 days No data recorded  General:   Pleasant male in no acute distress Head:  Normocephalic and atraumatic. Eyes: sclerae anicteric,conjunctive pink  Heart:  regular rate and rhythm, no murmurs or gallops Pulm: Clear anteriorly; no wheezing Abdomen:  Soft, Non-distended AB, Active bowel sounds. mild tenderness in the epigastrium. Without guarding and Without rebound, well-healed epigastric vertical scar, possible ventral hernia/scar tissue/lipomas along epigastric upper abdomen Extremities:  Without 1632 edema. Msk:  Symmetrical without gross deformities. Peripheral pulses intact.  Neurologic:  Alert and  oriented x4;  No focal deficits.  Skin:   Dry and intact without significant lesions or rashes. Psychiatric:  Cooperative. Normal mood and affect.  LAB RESULTS: Recent Labs    03/28/23 1140 03/29/23 0430  WBC 10.1 10.5  HGB 16.2 15.2  HCT 48.4 46.0  PLT 453* 451*   BMET Recent Labs    03/28/23 1140 03/29/23 0430  NA 132* 136  K 4.3 4.7  CL 96* 99  CO2 24 27  GLUCOSE 473* 232*  BUN 11 11  CREATININE 0.97 0.68  CALCIUM 9.3 9.2   LFT Recent Labs    03/29/23 0430  PROT 6.4*  ALBUMIN 3.4*  AST 33  ALT 55*  ALKPHOS 417*  BILITOT 0.7   PT/INR No results for input(s): "LABPROT", "INR" in the last 72 hours.  STUDIES: CT ABDOMEN PELVIS W CONTRAST  Result Date: 03/28/2023 CLINICAL DATA:  Epigastric pain as suspect pancreatitis. Previous partial pancreatectomy and splenectomy. EXAM: CT ABDOMEN AND PELVIS WITH CONTRAST TECHNIQUE: Multidetector CT imaging of the abdomen and pelvis was performed using the standard protocol following bolus administration of intravenous contrast. RADIATION DOSE REDUCTION: This exam was performed according to the departmental dose-optimization program which includes  automated exposure control, adjustment of the mA and/or kV according to patient size and/or use of iterative reconstruction technique. CONTRAST:  OMNIPAQUE IOHEXOL 300 MG/ML  SOLN COMPARISON:  02/12/2023 FINDINGS: Lower chest: Suggestion mild emphysematous disease. No focal airspace consolidation or effusion. Heart is normal size. Hepatobiliary: Previous cholecystectomy. No evidence of liver mass. There is dilatation of the common bile duct and central intrahepatic ducts without significant change. Pancreas: Evidence of patient's previous partial pancreatectomy. Again noted is a rim of pancreas over the pancreatic head unchanged. There appears to be a small remnant of pancreatic tissue over the distal body of the pancreas containing a segment of main pancreatic duct measuring approximately 5-6 mm (image [id number]) as this is unchanged. There is persistent fluid and mild fat stranding over the pancreatic surgical bed which may be slightly worse as cannot exclude active pancreatitis. There is a new 3.3 cm oval cystic structure abutting the greater curvature of the stomach. Slight interval decrease in size of a 1.9 x 2.1 cm density abutting the gastric fundus. These findings may represent persistent pancreatic pseudocysts versus other infected versus noninfected postsurgical collections. Spleen: Prior splenectomy. Adrenals/Urinary Tract: Adrenal glands are normal. Kidneys are normal size without hydronephrosis or nephrolithiasis. Ureters and bladder are normal. Stomach/Bowel: Stomach as described. Mild mucosal enhancement and wall thickening involving the duodenum and very proximal jejunum. Mid to distal small bowel is normal. Appendix is normal. Mild fecal retention throughout the  colon which is otherwise unremarkable. Vascular/Lymphatic: Abdominal aorta is normal caliber. Remaining vascular structures are unremarkable. No adenopathy. Reproductive: Normal. Other: None. Musculoskeletal: No focal abnormality.  IMPRESSION: 1. Evidence of patient's previous partial pancreatectomy and splenectomy. Residual pancreatic tissue over the pancreatic head and also over the expected distal body of the pancreas. Persistent fluid and mild fat stranding over the pancreatic surgical bed which may be slightly worse and may represent active pancreatitis. New 3.3 cm oval cystic structure abutting the greater curvature of the stomach. Slight interval decrease in size of a 1.9 x 2.1 cm density abutting the gastric fundus. These findings may represent persistent pancreatic pseudocysts versus other infected versus noninfected postsurgical collections. 2. Mild mucosal enhancement and wall thickening involving the duodenum and very proximal jejunum which may be secondary to adjacent pancreatic inflammatory process. 3. Previous cholecystectomy with stable dilatation of the common bile duct and central intrahepatic ducts. 4. Mild fecal retention throughout the colon. 5. Suggestion of mild emphysematous disease. Emphysema (ICD10-J43.9). Electronically Signed   By: Elberta Fortis M.D.   On: 03/28/2023 13:44      Impression    Epigastric pain, nausea and history of complicated necrotizing pancreatitis in setting of gallstone/post ERCP with biliary obstruction and recurrent pseudocyst ultimately resulting in distal pancreatectomy and splenectomy due to PD disruption 11/14/2022 CT abdomen pelvis shows persistent fluid mild fat stranding of her pancreatic surgical bed slightly worse may represent acute pancreatitis New 3.3 cm oval cystic structure abutting greater curvature of stomach interval decrease in size of 1.9 x 2.1 cm density abutting gastric fundus marrow present pancreatic pseudocyst versus postsurgical collections. Lipase 99 AST 33 ALT 55 Alkphos 417 TBili 0.7 Afebrile, WBC 10.1 no leukocytosis-no signs of infection  Pancreatic fluid collections post distal pancreatectomy and splenectomy Afebrile, WBC 10.1 no leukocytosis-no signs  of infection  Small mucosal enhancement wall thickening involving duodenum and proximal jejunum EGD 07/30/2022 with nonbleeding gastric ulcer clean base, normal duodenum at that time Most likely secondary to potentially pancreatitis History of gastric ulcer  EPI CT with stool burden possibly secondary to tramadol/pain medication use  Type 2 diabetes with hyperglycemia A1c 8.3 on 6/25 Continue per primary  Tobacco use/COPD Currently has nicotine patch Potentially contributing to recurrent pancreatitis  Thrombocytosis  likely secondary to inflammation  Principal Problem:   Acute on chronic pancreatitis (HCC) Active Problems:   Essential hypertension   Hyperlipidemia   Biliary stricture   Tobacco use disorder   Pancreatic pseudocyst   COPD (chronic obstructive pulmonary disease) (HCC)   History of pancreatectomy    LOS: 0 days     Plan   Difficult situation, possible continuing acute on chronic pancreatitis post distal pancreatomy and splenectomy, potential new pseudocyst versus surgical fluid collections.  May need to consider ERCP/EUS in the future based off imaging and increasing alk phos, will discuss further with our endoscopy specialist, will continue supportive care in the meantime.  - consider EGD to evaluate for GOO -Smoking cessation counseling, continue nicotine patch -Pain control, fluid replacement with LR, early ambulation.  -Advance diet as tolerated.  -Trend CBC, CMP -VTE prophylaxis. -Protonix 40 mg IV twice daily -Consider MiraLAX -Will follow along.  Thank you for your kind consultation, we will continue to follow.   Doree Albee  03/29/2023, 8:25 AM    [facility] Attending   I have taken an interval history, reviewed the chart and examined the patient. I agree with the Advanced Practitioner's note, impression and recommendations with the following additions:  Very complicated patient s/p distal pancreatectomy and admitted w/ increased  pain + nausea and vomiting. He has been losing weight despite eating well and taking pancreatic enzymes. Has peripancreatic fluid collections/pseudocysts. Cause of alkaline phosphatase elevation not clear - no clear biliary obstruction seen thus far but he does have stable extrahepatic dilation, s/p chole.Marland Kitchen  Poorly controlled diabetes is one possible cause of weight loss and is plausible.  Will start evaluation with an EGD to outline internal UGI anatomy and see if there are any stenoses or strictures causing problems.  The risks and benefits as well as alternatives of endoscopic procedure(s) have been discussed and reviewed. All questions answered. The patient agrees to proceed.   Iva Boop, MD, [facility] See Loretha Stapler on call - gastroenterology for best contact person 03/29/2023 3:57 PM

## 2023-03-29 NOTE — Anesthesia Preprocedure Evaluation (Addendum)
Anesthesia Evaluation  Patient identified by MRN, date of birth, ID band Patient awake    Reviewed: Allergy & Precautions, NPO status , Patient's Chart, lab work & pertinent test results  Airway Mallampati: I  TM Distance: >3 FB Neck ROM: Full    Dental no notable dental hx. (+) Edentulous Upper, Edentulous Lower   Pulmonary COPD, Current Smoker and Patient abstained from smoking.   Pulmonary exam normal breath sounds clear to auscultation       Cardiovascular hypertension, Normal cardiovascular exam Rhythm:Regular Rate:Normal     Neuro/Psych     Bipolar Disorder Schizophrenia     GI/Hepatic pancreatitis   Endo/Other  diabetes    Renal/GU Renal InsufficiencyRenal disease     Musculoskeletal   Abdominal   Peds  Hematology   Anesthesia Other Findings All: Desipramine  Reproductive/Obstetrics                             Anesthesia Physical Anesthesia Plan  ASA: 3  Anesthesia Plan: MAC   Post-op Pain Management: Minimal or no pain anticipated   Induction:   PONV Risk Score and Plan: Treatment may vary due to age or medical condition and Propofol infusion  Airway Management Planned: Nasal Cannula and Natural Airway  Additional Equipment: None  Intra-op Plan:   Post-operative Plan:   Informed Consent: I have reviewed the patients History and Physical, chart, labs and discussed the procedure including the risks, benefits and alternatives for the proposed anesthesia with the patient or authorized representative who has indicated his/her understanding and acceptance.     Dental advisory given  Plan Discussed with: CRNA  Anesthesia Plan Comments: (EGD for Nausea and vomiting)        Anesthesia Quick Evaluation

## 2023-03-30 ENCOUNTER — Telehealth: Payer: Self-pay

## 2023-03-30 ENCOUNTER — Encounter (HOSPITAL_COMMUNITY): Payer: Self-pay | Admitting: Internal Medicine

## 2023-03-30 ENCOUNTER — Inpatient Hospital Stay (HOSPITAL_COMMUNITY): Payer: MEDICAID | Admitting: Anesthesiology

## 2023-03-30 ENCOUNTER — Encounter (HOSPITAL_COMMUNITY)
Admission: EM | Disposition: A | Discharge status: Home / Self Care | Payer: Self-pay | Source: Home / Self Care | Attending: Internal Medicine

## 2023-03-30 DIAGNOSIS — R1013 Epigastric pain: Secondary | ICD-10-CM

## 2023-03-30 DIAGNOSIS — R112 Nausea with vomiting, unspecified: Secondary | ICD-10-CM

## 2023-03-30 DIAGNOSIS — K861 Other chronic pancreatitis: Secondary | ICD-10-CM | POA: Diagnosis not present

## 2023-03-30 DIAGNOSIS — K859 Acute pancreatitis without necrosis or infection, unspecified: Secondary | ICD-10-CM | POA: Diagnosis not present

## 2023-03-30 HISTORY — PX: ESOPHAGOGASTRODUODENOSCOPY (EGD) WITH PROPOFOL: SHX5813

## 2023-03-30 LAB — HEMOGLOBIN A1C
Hgb A1c MFr Bld: 14.9 % — ABNORMAL HIGH (ref 4.8–5.6)
Mean Plasma Glucose: 380.93 mg/dL

## 2023-03-30 LAB — GLUCOSE, CAPILLARY
Glucose-Capillary: 101 mg/dL — ABNORMAL HIGH (ref 70–99)
Glucose-Capillary: 276 mg/dL — ABNORMAL HIGH (ref 70–99)
Glucose-Capillary: 194 mg/dL — ABNORMAL HIGH (ref 70–99)

## 2023-03-30 SURGERY — ESOPHAGOGASTRODUODENOSCOPY (EGD) WITH PROPOFOL
Anesthesia: Monitor Anesthesia Care

## 2023-03-30 MED ORDER — LACTATED RINGERS IV SOLN: INTRAVENOUS | Status: DC

## 2023-03-30 MED ORDER — SODIUM CHLORIDE 0.9 % IV SOLN: INTRAVENOUS | Status: DC

## 2023-03-30 MED ORDER — LIDOCAINE 2% (20 MG/ML) 5 ML SYRINGE
INTRAMUSCULAR | Status: DC | PRN
  Administered 2023-03-30: 60 mg via INTRAVENOUS

## 2023-03-30 MED ORDER — EPHEDRINE SULFATE (PRESSORS) 50 MG/ML IJ SOLN
INTRAMUSCULAR | Status: DC | PRN
Start: 2023-03-30 — End: 2023-03-30
  Administered 2023-03-30: 5 mg via INTRAVENOUS

## 2023-03-30 MED ORDER — PROPOFOL 500 MG/50ML IV EMUL
INTRAVENOUS | Status: AC
  Filled 2023-03-30: qty 50

## 2023-03-30 MED ORDER — GLYCOPYRROLATE 0.2 MG/ML IJ SOLN
INTRAMUSCULAR | Status: DC | PRN
Start: 2023-03-30 — End: 2023-03-30
  Administered 2023-03-30 (×2): .1 mg via INTRAVENOUS

## 2023-03-30 MED ORDER — PROPOFOL 500 MG/50ML IV EMUL
INTRAVENOUS | Status: DC | PRN
  Administered 2023-03-30: 150 ug/kg/min via INTRAVENOUS

## 2023-03-30 SURGICAL SUPPLY — 15 items

## 2023-03-30 NOTE — Progress Notes (Signed)
PROGRESS NOTE Steve Romero  EXB:[id number] DOB: February 17, 1971 DOA: 03/28/2023 PCP: Raliegh Ip, DO  Brief Narrative/Hospital Course: 52 year old male with chronic pancreatitis, anxiety/depression/bipolar disorder/seizure affective disorder, COPD, BPH, HLD, HTN, sleep apnea not on CPAP presented with abdominal pain, nausea and 3 episodes of vomiting/yellowish since 9/31, was seen in the ED 9/22. Labs showed ketones glucose in the urine WBC stable lipase 99 CMP with glucose 473 alk phos elevated at 456 CT abdomen/pelvis with contrast >> previous partial pancreatectomy with residual pancreatic tissue showing persistent fluid and mild stranding over the surgical bed is likely a recurrence of pancreatitis. New 3.3 cm oval cystic structure abutting the greater curvature of the stomach. Slight interval decrease in size of a 1.9 x 2.1 cm density abutting the gastric fundus. These findings may represent persistent pancreatic pseudocysts versus other infected versus noninfected postsurgical collections. Also mild mucosal enhancement and and very proximal jejunum may be secondary to adjacent pancreatic inflammatory changes. Previous cholecystectomy with stable dilatation of the CBD mild fecal retention throughout the colon.  GI consulted admitted for further management 9/23: Per GI patient is complicated with status post distal pancreatectomy-admitted with increased pain nausea vomiting and weight loss, with imaging showing peripancreatic fluid collection/pseudocyst and elevated alk phos-no clear biliary obstruction seen> EGD recommended to obtain UGI anatomy to look for any stenosis or strictures causing problems    Subjective: Patient seen and examined this morning Has been ambulating in the hallway complains of ongoing abdominal pain Past 24 hours afebrile.  BP stable.  Labs stable w/ controlled blood sugar Going for EGD this am.  Assessment and Plan: Principal Problem:   Acute on chronic  pancreatitis (HCC) Active Problems:   Pancreatic pseudocyst   Essential hypertension   Hyperlipidemia   Biliary stricture   Tobacco use disorder   COPD (chronic obstructive pulmonary disease) (HCC)   History of pancreatectomy   Pancreatitis  Nausea vomiting abdominal pain and weight loss Acute on chronic pancreatitis Peripancreatic fluid collection/pseudocyst GI consulted - he is complicated patient with s/p distal pancreatectomy-admitted with increased pain nausea vomiting and weight loss, with imaging showing peripancreatic fluid collection/pseudocyst and elevated alk phos-no clear biliary obstruction seen> EGD recommended to obtain UGI anatomy to look for any stenosis or strictures causing problems. Continue current plan with IV fluid hydration, PPI IV.  Antiemetics pain management  Diabetes mellitus with uncontrolled hyperglycemia: Last HbA1c 8.3 on June 2024, update A1c, continue 15 units bedtime Semglee ( at home on 26 units qhs)  cont SSI Recent Labs  Lab 03/29/23 0741 03/29/23 1120 03/29/23 1651 03/29/23 2153 03/30/23 0439 03/30/23 0735  GLUCAP 235* 311* 74 198*  --  101*  HGBA1C  --   --   --   --  14.9*  --      Hypertension HLD: Holding statin, monitor BP and continue as needed meds.  Not p.o. meds at home  COPD Tobacco use: Continue bronchodilators, cessation counseling and nicotine patch.  DVT prophylaxis: enoxaparin (LOVENOX) injection 40 mg Start: 03/28/23 2200 Code Status:   Code Status: Full Code Family Communication: plan of care discussed with patient at bedside. Patient status is:  admitted as observation but remains hospitalized for ongoing  because of abdomeen pain Level of care: Med-Surg   Dispo: The patient is from: home            Anticipated disposition: home 2 days  Objective: Vitals last 24 hrs: Vitals:   03/29/23 0921 03/29/23 1356 03/29/23 2151 03/30/23 0639  BP: 104/73 Marland Kitchen)  128/117 107/71 104/70  Pulse: (!) 50 (!) 55 (!) 45 (!) 56   Resp:   18 16  Temp: 98 F (36.7 C) (!) 97.5 F (36.4 C) 98 F (36.7 C) 97.6 F (36.4 C)  TempSrc: Oral Oral Oral Oral  SpO2: 99% 98% 98% 98%  Weight:      Height:       Weight change:   Physical Examination: General exam: alert awake HEENT:Oral mucosa moist, Ear/Nose WNL grossly Respiratory system: Bilaterally clear BS,no use of accessory muscle Cardiovascular system: S1 & S2 +, No JVD. Gastrointestinal system: Abdomen soft,Tenderness+ scar+ mildly distended Nervous System: Alert, awake, moving all extremities,and following commands. Extremities: LE edema neg,distal peripheral pulses palpable and warm.  Skin: No rashes,no icterus. MSK: Normal muscle bulk,tone, power   Medications reviewed:  Scheduled Meds:  enoxaparin (LOVENOX) injection  40 mg Subcutaneous Q24H   insulin aspart  0-15 Units Subcutaneous TID WC   insulin glargine-yfgn  15 Units Subcutaneous QHS   pantoprazole (PROTONIX) IV  40 mg Intravenous Q24H   Continuous Infusions:  lactated ringers 125 mL/hr at 03/30/23 3086     Diet Order             Diet NPO time specified Except for: Sips with Meds  Diet effective ____                  Intake/Output Summary (Last 24 hours) at 03/30/2023 0930 Last data filed at 03/30/2023 5784 Gross per 24 hour  Intake 4308.59 ml  Output --  Net 4308.59 ml   Net IO Since Admission: 5,497.79 mL [03/30/23 0930]  Wt Readings from Last 3 Encounters:  03/28/23 80.3 kg  02/03/23 88.1 kg  01/20/23 88.9 kg     Unresulted Labs (From admission, onward)    None     Data Reviewed: I have personally reviewed following labs and imaging studies CBC: Recent Labs  Lab 03/28/23 1140 03/29/23 0430  WBC 10.1 10.5  NEUTROABS 6.6  --   HGB 16.2 15.2  HCT 48.4 46.0  MCV 83.3 85.5  PLT 453* 451*   Basic Metabolic Panel: Recent Labs  Lab 03/28/23 1140 03/29/23 0430  NA 132* 136  K 4.3 4.7  CL 96* 99  CO2 24 27  GLUCOSE 473* 232*  BUN 11 11  CREATININE 0.97 0.68   CALCIUM 9.3 9.2  GFR Estimated Creatinine Clearance: 108 mL/min (by C-G formula based on SCr of 0.68 mg/dL). Liver Function Tests: Recent Labs  Lab 03/28/23 1140 03/29/23 0430  AST 20 33  ALT 59* 55*  ALKPHOS 456* 417*  BILITOT 0.7 0.7  PROT 7.3 6.4*  ALBUMIN 4.0 3.4*   Recent Labs  Lab 03/28/23 1140  LIPASE 99*  CBG: Recent Labs  Lab 03/29/23 0741 03/29/23 1120 03/29/23 1651 03/29/23 2153 03/30/23 0735  GLUCAP 235* 311* 74 198* 101*  Antimicrobials: Anti-infectives (From admission, onward)    None      Culture/Microbiology    Component Value Date/Time   SDES  06/16/2022 1458    CYSTS Performed at [facility], [address] [place], [place] [address]    SPECREQUEST PANCREATIC CYST 06/16/2022 1458   CULT  06/16/2022 1458    NO GROWTH 3 DAYS Performed at [facility], [address] [place], [place] [address]    REPTSTATUS 06/19/2022 FINAL 06/16/2022 1458    Radiology Studies: CT ABDOMEN PELVIS W CONTRAST  Result Date: 03/28/2023 CLINICAL DATA:  Epigastric pain as suspect pancreatitis. Previous  partial pancreatectomy and splenectomy. EXAM: CT ABDOMEN AND PELVIS WITH CONTRAST TECHNIQUE: Multidetector CT imaging of the abdomen and pelvis was performed using the standard protocol following bolus administration of intravenous contrast. RADIATION DOSE REDUCTION: This exam was performed according to the departmental dose-optimization program which includes automated exposure control, adjustment of the mA and/or kV according to patient size and/or use of iterative reconstruction technique. CONTRAST:  OMNIPAQUE IOHEXOL 300 MG/ML  SOLN COMPARISON:  02/12/2023 FINDINGS: Lower chest: Suggestion mild emphysematous disease. No focal airspace consolidation or effusion. Heart is normal size. Hepatobiliary: Previous cholecystectomy. No evidence of liver mass. There is dilatation of the common bile duct and central intrahepatic ducts without  significant change. Pancreas: Evidence of patient's previous partial pancreatectomy. Again noted is a rim of pancreas over the pancreatic head unchanged. There appears to be a small remnant of pancreatic tissue over the distal body of the pancreas containing a segment of main pancreatic duct measuring approximately 5-6 mm (image 23-27) as this is unchanged. There is persistent fluid and mild fat stranding over the pancreatic surgical bed which may be slightly worse as cannot exclude active pancreatitis. There is a new 3.3 cm oval cystic structure abutting the greater curvature of the stomach. Slight interval decrease in size of a 1.9 x 2.1 cm density abutting the gastric fundus. These findings may represent persistent pancreatic pseudocysts versus other infected versus noninfected postsurgical collections. Spleen: Prior splenectomy. Adrenals/Urinary Tract: Adrenal glands are normal. Kidneys are normal size without hydronephrosis or nephrolithiasis. Ureters and bladder are normal. Stomach/Bowel: Stomach as described. Mild mucosal enhancement and wall thickening involving the duodenum and very proximal jejunum. Mid to distal small bowel is normal. Appendix is normal. Mild fecal retention throughout the colon which is otherwise unremarkable. Vascular/Lymphatic: Abdominal aorta is normal caliber. Remaining vascular structures are unremarkable. No adenopathy. Reproductive: Normal. Other: None. Musculoskeletal: No focal abnormality. IMPRESSION: 1. Evidence of patient's previous partial pancreatectomy and splenectomy. Residual pancreatic tissue over the pancreatic head and also over the expected distal body of the pancreas. Persistent fluid and mild fat stranding over the pancreatic surgical bed which may be slightly worse and may represent active pancreatitis. New 3.3 cm oval cystic structure abutting the greater curvature of the stomach. Slight interval decrease in size of a 1.9 x 2.1 cm density abutting the gastric  fundus. These findings may represent persistent pancreatic pseudocysts versus other infected versus noninfected postsurgical collections. 2. Mild mucosal enhancement and wall thickening involving the duodenum and very proximal jejunum which may be secondary to adjacent pancreatic inflammatory process. 3. Previous cholecystectomy with stable dilatation of the common bile duct and central intrahepatic ducts. 4. Mild fecal retention throughout the colon. 5. Suggestion of mild emphysematous disease. Emphysema (ICD10-J43.9). Electronically Signed   By: Elberta Fortis M.D.   On: 03/28/2023 13:44    LOS: 1 day   Lanae Boast, MD [facility]  03/30/2023, 9:30 AM

## 2023-03-30 NOTE — Interval H&P Note (Signed)
History and Physical Interval Note:  03/30/2023 12:36 PM  Steve Romero  has presented today for surgery, with the diagnosis of nausea, vomiting.  The various methods of treatment have been discussed with the patient and family. After consideration of risks, benefits and other options for treatment, the patient has consented to  Procedure(s): ESOPHAGOGASTRODUODENOSCOPY (EGD) WITH PROPOFOL (N/A) as a surgical intervention.  The patient's history has been reviewed, patient examined, no change in status, stable for surgery.  I have reviewed the patient's chart and labs.  Questions were answered to the patient's satisfaction.     Stan Head

## 2023-03-30 NOTE — Transfer of Care (Signed)
Immediate Anesthesia Transfer of Care Note  Patient: Steve Romero  Procedure(s) Performed: ESOPHAGOGASTRODUODENOSCOPY (EGD) WITH PROPOFOL  Patient Location: PACU  Anesthesia Type:MAC  Level of Consciousness: drowsy and patient cooperative  Airway & Oxygen Therapy: Patient Spontanous Breathing and Patient connected to face mask oxygen  Post-op Assessment: Report given to RN and Post -op Vital signs reviewed and stable  Post vital signs: Reviewed and stable  Last Vitals:  Vitals Value Taken Time  BP 103/57 03/30/23 1300  Temp    Pulse 49 03/30/23 1300  Resp 12 03/30/23 1300  SpO2 100 % 03/30/23 1300  Vitals shown include unfiled device data.  Last Pain:  Vitals:   03/30/23 1057  TempSrc: Temporal  PainSc: 6       Patients Stated Pain Goal: 2 (03/30/23 0618)  Complications: No notable events documented.

## 2023-03-30 NOTE — Anesthesia Postprocedure Evaluation (Signed)
Anesthesia Post Note  Patient: Trandon Longtin Mario  Procedure(s) Performed: ESOPHAGOGASTRODUODENOSCOPY (EGD) WITH PROPOFOL     Patient location during evaluation: Endoscopy Anesthesia Type: MAC Level of consciousness: awake and alert Pain management: pain level controlled Vital Signs Assessment: post-procedure vital signs reviewed and stable Respiratory status: spontaneous breathing, nonlabored ventilation, respiratory function stable and patient connected to nasal cannula oxygen Cardiovascular status: blood pressure returned to baseline and stable Postop Assessment: no apparent nausea or vomiting Anesthetic complications: no   No notable events documented.  Last Vitals:  Vitals:   03/30/23 1330 03/30/23 2009  BP: 132/88 113/72  Pulse: (!) 54 (!) 53  Resp: 14 18  Temp: 36.8 C 36.8 C  SpO2: 100% 97%    Last Pain:  Vitals:   03/30/23 2029  TempSrc:   PainSc: 4                  Trevor Iha

## 2023-03-30 NOTE — Telephone Encounter (Signed)
CT cancelled as requested.

## 2023-03-30 NOTE — Op Note (Signed)
[facility] Patient Name: Steve Romero Procedure Date: 03/30/2023 MRN: [id number] Attending MD: Iva Boop , MD, [id number] Date of Birth: 06/22/1971 CSN: [id number] Age: 52 Admit Type: Outpatient Procedure:                Upper GI endoscopy Indications:              Epigastric abdominal pain, Nausea with vomiting Providers:                Iva Boop, MD, Jacquelyn "Jaci" Clelia Croft, RN,                            Fransisca Connors, Beryle Beams, Technician,                            Rutha Bouchard CRNA Referring MD:              Medicines:                Monitored Anesthesia Care Complications:            No immediate complications. Estimated Blood Loss:     Estimated blood loss: none. Procedure:                Pre-Anesthesia Assessment:                           - Prior to the procedure, a History and Physical                            was performed, and patient medications and                            allergies were reviewed. The patient's tolerance of                            previous anesthesia was also reviewed. The risks                            and benefits of the procedure and the sedation                            options and risks were discussed with the patient.                            All questions were answered, and informed consent                            was obtained. Prior Anticoagulants: The patient                            last took Lovenox (enoxaparin) 1 day prior to the                            procedure. After reviewing the risks and benefits,  the patient was deemed in satisfactory condition to                            undergo the procedure.                           After obtaining informed consent, the endoscope was                            passed under direct vision. Throughout the                            procedure, the patient's blood pressure, pulse, and                             oxygen saturations were monitored continuously. The                            [id number]-[id number] ([id number] Olympus endoscope was introduced                            through the mouth, and advanced to the second part                            of duodenum. The upper GI endoscopy was                            accomplished without difficulty. The patient                            tolerated the procedure well. Scope In: Scope Out: Findings:      The esophagus was normal.      The stomach was normal.      The examined duodenum was normal. Impression:               - Normal esophagus.                           - Normal stomach.                           - Normal examined duodenum.                           - No specimens collected. NO CAUSE OF PAIN AND                            VOMITING SEEN SO WILL ATTRIBUTE THAT TO                            PANCREATITIS + FLUID COLLECTIONS/PSEUDOCYUSTS                           Hgb A1C 14.1% explains unintentional weight loss -  think we can cancel outpatient CT chest planned Moderate Sedation:      Not Applicable - Patient had care per Anesthesia. Recommendation:           - Patient has a contact number available for                            emergencies. The signs and symptoms of potential                            delayed complications were discussed with the                            patient. Return to normal activities tomorrow.                            Written discharge instructions were provided to the                            patient.                           - Full liquid diet.                           - Continue present medications. Procedure Code(s):        --- Professional ---                           [phone], Esophagogastroduodenoscopy, flexible,                            transoral; diagnostic, including collection of                            specimen(s) by brushing or washing, when performed                             (separate procedure) Diagnosis Code(s):        --- Professional ---                           R10.13, Epigastric pain                           R11.2, Nausea with vomiting, unspecified CPT copyright 2022 [employer]. All rights reserved. The codes documented in this report are preliminary and upon coder review may  be revised to meet current compliance requirements. Iva Boop, MD 03/30/2023 12:58:24 PM This report has been signed electronically. Number of Addenda: 0

## 2023-03-30 NOTE — Plan of Care (Signed)
Problem: Cardiac: Goal: Ability to maintain an adequate cardiac output will improve Outcome: Progressing   Problem: Health Behavior/Discharge Planning: Goal: Ability to identify and utilize available resources and services will improve Outcome: Progressing Goal: Ability to manage health-related needs will improve Outcome: Progressing

## 2023-03-30 NOTE — Telephone Encounter (Signed)
-----   Message from [facility] sent at 03/30/2023  1:16 PM EDT ----- Regarding: RE: Weight loss - chest CT CEG, That's a huge jump. GM ----- Message ----- From: Iva Boop, MD Sent: 03/30/2023   1:14 PM EDT To: Loretha Stapler, RN; Lemar Lofty., MD Subject: Weight loss - chest CT                         His Hgb A1C is 14.1% - I think this explains the weight loss  So I recommend canceling outpatient CT  He is still in hospital - EGD today was NL  Baldo Ash

## 2023-03-31 ENCOUNTER — Telehealth: Payer: Self-pay

## 2023-03-31 DIAGNOSIS — K831 Obstruction of bile duct: Secondary | ICD-10-CM | POA: Diagnosis not present

## 2023-03-31 DIAGNOSIS — Z90410 Acquired total absence of pancreas: Secondary | ICD-10-CM

## 2023-03-31 DIAGNOSIS — R634 Abnormal weight loss: Secondary | ICD-10-CM

## 2023-03-31 DIAGNOSIS — K863 Pseudocyst of pancreas: Secondary | ICD-10-CM | POA: Diagnosis not present

## 2023-03-31 DIAGNOSIS — I10 Essential (primary) hypertension: Secondary | ICD-10-CM

## 2023-03-31 DIAGNOSIS — K861 Other chronic pancreatitis: Secondary | ICD-10-CM

## 2023-03-31 DIAGNOSIS — J449 Chronic obstructive pulmonary disease, unspecified: Secondary | ICD-10-CM

## 2023-03-31 DIAGNOSIS — F17200 Nicotine dependence, unspecified, uncomplicated: Secondary | ICD-10-CM

## 2023-03-31 DIAGNOSIS — K859 Acute pancreatitis without necrosis or infection, unspecified: Secondary | ICD-10-CM | POA: Diagnosis not present

## 2023-03-31 DIAGNOSIS — Z8719 Personal history of other diseases of the digestive system: Secondary | ICD-10-CM

## 2023-03-31 DIAGNOSIS — K8689 Other specified diseases of pancreas: Secondary | ICD-10-CM

## 2023-03-31 LAB — GLUCOSE, CAPILLARY
Glucose-Capillary: 87 mg/dL (ref 70–99)
Glucose-Capillary: 233 mg/dL — ABNORMAL HIGH (ref 70–99)

## 2023-03-31 MED ORDER — OXYCODONE HCL 10 MG PO TABS: 10.0000 mg | ORAL_TABLET | Freq: Four times a day (QID) | ORAL | 0 refills | Status: DC | PRN

## 2023-03-31 MED ORDER — ESOMEPRAZOLE MAGNESIUM 20 MG PO CPDR: 20.0000 mg | DELAYED_RELEASE_CAPSULE | Freq: Every day | ORAL | 1 refills | Status: DC

## 2023-03-31 MED ORDER — OXYCODONE HCL 5 MG PO TABS: 10.0000 mg | ORAL_TABLET | Freq: Four times a day (QID) | ORAL | Status: DC | PRN

## 2023-03-31 MED ORDER — ONDANSETRON HCL 4 MG PO TABS: 4.0000 mg | ORAL_TABLET | Freq: Four times a day (QID) | ORAL | 0 refills | Status: DC | PRN

## 2023-03-31 NOTE — Discharge Summary (Addendum)
Physician Discharge Summary   Patient: Steve Romero MRN: [id number] DOB: 07-12-1970  Admit date:     03/28/2023  Discharge date: 03/31/23  Discharge Physician: Arnetha Courser   PCP: Raliegh Ip, DO   Recommendations at discharge:  Please obtain CBC and CMP on follow-up Please encourage compliance for better control of diabetes.  A1c of 14.9. Follow-up with primary care provider within a week Follow-up with GI  Discharge Diagnoses: Principal Problem:   Acute on chronic pancreatitis [facility]) Active Problems:   Pancreatic pseudocyst   Essential hypertension   Hyperlipidemia   Biliary stricture   Tobacco use disorder   COPD (chronic obstructive pulmonary disease) (HCC)   History of pancreatectomy   Pancreatitis   Hospital Course: 52 year old male with chronic pancreatitis, anxiety/depression/bipolar disorder/seizure affective disorder, COPD, BPH, HLD, HTN, sleep apnea not on CPAP presented with abdominal pain, nausea and 3 episodes of vomiting/yellowish since 9/31, was seen in the ED 9/22. Labs showed ketones glucose in the urine WBC stable lipase 99 CMP with glucose 473 alk phos elevated at 456 CT abdomen/pelvis with contrast >> previous partial pancreatectomy with residual pancreatic tissue showing persistent fluid and mild stranding over the surgical bed is likely a recurrence of pancreatitis. New 3.3 cm oval cystic structure abutting the greater curvature of the stomach. Slight interval decrease in size of a 1.9 x 2.1 cm density abutting the gastric fundus. These findings may represent persistent pancreatic pseudocysts versus other infected versus noninfected postsurgical collections. Also mild mucosal enhancement and and very proximal jejunum may be secondary to adjacent pancreatic inflammatory changes. Previous cholecystectomy with stable dilatation of the CBD mild fecal retention throughout the colon.  GI consulted admitted for further management 9/23: Per GI patient is  complicated with status post distal pancreatectomy-admitted with increased pain nausea vomiting and weight loss, with imaging showing peripancreatic fluid collection/pseudocyst and elevated alk phos-no clear biliary obstruction seen> EGD recommended to obtain UGI anatomy to look for any stenosis or strictures causing problems.  9/24: EGD yesterday without any significant abnormality.  Continue to have some pain but controlled with oxycodone.  No nausea or vomiting and tolerating soft diet well. Patient is being discharged on some oxycodone to be used only as needed for severe pain. He will continue his home medications including Creon.  CBG seems well-controlled with Semglee at a lower dose than home, significantly elevated A1c of 14.7, seems like noncompliance.  Patient was instructed to follow-up closely with his PCP for better control of diabetes.  PCP can refer to see an endocrinologist if needed.  Patient will follow-up with GI as outpatient for further recommendations.    Patient will continue on current medications and need to have a close follow-up with his providers for further recommendations.   Consultants: Gastroenterology Procedures performed: EGD Disposition: Home Diet recommendation:  Discharge Diet Orders (From admission, onward)     Start     Ordered   03/31/23 0000  Diet - low sodium heart healthy        03/31/23 1129           Cardiac and Carb modified diet DISCHARGE MEDICATION: Allergies as of 03/31/2023       Reactions   Desipramine Nausea Only, Rash        Medication List     STOP taking these medications    Lantus SoloStar 100 UNIT/ML Solostar Pen Generic drug: insulin glargine       TAKE these medications    acetaminophen 500 MG tablet Commonly  known as: TYLENOL Take 2 tablets (1,000 mg total) by mouth every 8 (eight) hours as needed for mild pain.   BD Pen Needle Nano U/F 32G X 4 MM Misc Generic drug: Insulin Pen Needle UAD to  administer insulin   esomeprazole 20 MG capsule Commonly known as: NexIUM Take 1 capsule (20 mg total) by mouth daily.   FreeStyle Libre 3 Sensor Misc Place 1 sensor on the skin every 14 days. Use to check glucose continuously e11.9   FreeStyle Libre Reader Devi 1 Units by Does not apply route daily. UAD to test BGs daily. Dx E11.9   lipase/protease/amylase 40981 UNITS Cpep capsule Commonly known as: Creon Take 3 capsules with meals and 1-2  capsule with snacks ( up too 2 snacks ) What changed:  how much to take how to take this when to take this additional instructions   ondansetron 4 MG tablet Commonly known as: ZOFRAN Take 1 tablet (4 mg total) by mouth every 6 (six) hours as needed for nausea.   Oxycodone HCl 10 MG Tabs Take 1 tablet (10 mg total) by mouth every 6 (six) hours as needed for moderate pain.   rosuvastatin 10 MG tablet Commonly known as: CRESTOR Take 1 tablet (10 mg total) by mouth at bedtime. If you experience muscle aches take every other day   Semglee (yfgn) 100 UNIT/ML Pen Generic drug: insulin glargine-yfgn Inject 26 Units into the skin at bedtime.   traMADol 50 MG tablet Commonly known as: ULTRAM Take 1 tablet (50 mg total) by mouth every 8 (eight) hours as needed. What changed: reasons to take this        Follow-up Information     Raliegh Ip, DO. Schedule an appointment as soon as possible for a visit in 1 week(s).   Specialty: Family Medicine Contact information: [address] [place] [address] [phone]         Iva Boop, MD. Schedule an appointment as soon as possible for a visit.   Specialty: Gastroenterology Contact information: [address] [place] [address] [phone]                Discharge Exam: Ceasar Mons Weights   03/28/23 0955  Weight: 80.3 kg   General.  Well-developed gentleman, in no acute distress. Pulmonary.  Lungs clear bilaterally, normal respiratory effort. CV.   Regular rate and rhythm, no JVD, rub or murmur. Abdomen.  Soft, nontender, nondistended, BS positive. CNS.  Alert and oriented .  No focal neurologic deficit. Extremities.  No edema, no cyanosis, pulses intact and symmetrical. Psychiatry.  Judgment and insight appears normal.   Condition at discharge: stable  The results of significant diagnostics from this hospitalization (including imaging, microbiology, ancillary and laboratory) are listed below for reference.   Imaging Studies: CT ABDOMEN PELVIS W CONTRAST  Result Date: 03/28/2023 CLINICAL DATA:  Epigastric pain as suspect pancreatitis. Previous partial pancreatectomy and splenectomy. EXAM: CT ABDOMEN AND PELVIS WITH CONTRAST TECHNIQUE: Multidetector CT imaging of the abdomen and pelvis was performed using the standard protocol following bolus administration of intravenous contrast. RADIATION DOSE REDUCTION: This exam was performed according to the departmental dose-optimization program which includes automated exposure control, adjustment of the mA and/or kV according to patient size and/or use of iterative reconstruction technique. CONTRAST:  OMNIPAQUE IOHEXOL 300 MG/ML  SOLN COMPARISON:  02/12/2023 FINDINGS: Lower chest: Suggestion mild emphysematous disease. No focal airspace consolidation or effusion. Heart is normal size. Hepatobiliary: Previous cholecystectomy. No evidence of liver  mass. There is dilatation of the common bile duct and central intrahepatic ducts without significant change. Pancreas: Evidence of patient's previous partial pancreatectomy. Again noted is a rim of pancreas over the pancreatic head unchanged. There appears to be a small remnant of pancreatic tissue over the distal body of the pancreas containing a segment of main pancreatic duct measuring approximately 5-6 mm (image 23-27) as this is unchanged. There is persistent fluid and mild fat stranding over the pancreatic surgical bed which may be slightly worse as  cannot exclude active pancreatitis. There is a new 3.3 cm oval cystic structure abutting the greater curvature of the stomach. Slight interval decrease in size of a 1.9 x 2.1 cm density abutting the gastric fundus. These findings may represent persistent pancreatic pseudocysts versus other infected versus noninfected postsurgical collections. Spleen: Prior splenectomy. Adrenals/Urinary Tract: Adrenal glands are normal. Kidneys are normal size without hydronephrosis or nephrolithiasis. Ureters and bladder are normal. Stomach/Bowel: Stomach as described. Mild mucosal enhancement and wall thickening involving the duodenum and very proximal jejunum. Mid to distal small bowel is normal. Appendix is normal. Mild fecal retention throughout the colon which is otherwise unremarkable. Vascular/Lymphatic: Abdominal aorta is normal caliber. Remaining vascular structures are unremarkable. No adenopathy. Reproductive: Normal. Other: None. Musculoskeletal: No focal abnormality. IMPRESSION: 1. Evidence of patient's previous partial pancreatectomy and splenectomy. Residual pancreatic tissue over the pancreatic head and also over the expected distal body of the pancreas. Persistent fluid and mild fat stranding over the pancreatic surgical bed which may be slightly worse and may represent active pancreatitis. New 3.3 cm oval cystic structure abutting the greater curvature of the stomach. Slight interval decrease in size of a 1.9 x 2.1 cm density abutting the gastric fundus. These findings may represent persistent pancreatic pseudocysts versus other infected versus noninfected postsurgical collections. 2. Mild mucosal enhancement and wall thickening involving the duodenum and very proximal jejunum which may be secondary to adjacent pancreatic inflammatory process. 3. Previous cholecystectomy with stable dilatation of the common bile duct and central intrahepatic ducts. 4. Mild fecal retention throughout the colon. 5. Suggestion of mild  emphysematous disease. Emphysema (ICD10-J43.9). Electronically Signed   By: Elberta Fortis M.D.   On: 03/28/2023 13:44    Microbiology: Results for orders placed or performed during the hospital encounter of 11/12/22  Surgical pcr screen     Status: None   Collection Time: 11/17/22  1:34 AM   Specimen: Nasal Mucosa; Nasal Swab  Result Value Ref Range Status   MRSA, PCR NEGATIVE NEGATIVE Final   Staphylococcus aureus NEGATIVE NEGATIVE Final    Comment: (NOTE) The Xpert SA Assay (FDA approved for NASAL specimens in patients 35 years of age and older), is one component of a comprehensive surveillance program. It is not intended to diagnose infection nor to guide or monitor treatment. Performed at [facility], [address] [place], [place] [address]     Labs: CBC: Recent Labs  Lab 03/28/23 1140 03/29/23 0430  WBC 10.1 10.5  NEUTROABS 6.6  --   HGB 16.2 15.2  HCT 48.4 46.0  MCV 83.3 85.5  PLT 453* 451*   Basic Metabolic Panel: Recent Labs  Lab 03/28/23 1140 03/29/23 0430  NA 132* 136  K 4.3 4.7  CL 96* 99  CO2 24 27  GLUCOSE 473* 232*  BUN 11 11  CREATININE 0.97 0.68  CALCIUM 9.3 9.2   Liver Function Tests: Recent Labs  Lab 03/28/23 1140 03/29/23 0430  AST 20 33  ALT 59*  55*  ALKPHOS 456* 417*  BILITOT 0.7 0.7  PROT 7.3 6.4*  ALBUMIN 4.0 3.4*   CBG: Recent Labs  Lab 03/29/23 2153 03/30/23 0735 03/30/23 1637 03/30/23 2144 03/31/23 0753  GLUCAP 198* 101* 276* 194* 87    Discharge time spent: greater than 30 minutes.  This record has been created using Conservation officer, historic buildings. Errors have been sought and corrected,but may not always be located. Such creation errors do not reflect on the standard of care.   Signed: Arnetha Courser, MD [facility] 03/31/2023

## 2023-03-31 NOTE — Telephone Encounter (Signed)
Office visit set up for 06/08/23 at 2:30 pm with GM MRI MRCP ordered and schedulers to set up for 1-2 weeks prior to appt.

## 2023-03-31 NOTE — Telephone Encounter (Signed)
-----   Message from [facility] sent at 03/31/2023 11:33 AM EDT ----- Steve Romero, Plan for followup in clinic with me or APP in 6-weeks. OK to overbook with me if needed. Plan for 1-2 weeks prior to get updated imaging.  MRI/MRCP Pancreatic Cyst, recurrent pancreatitis, abnormal LFTs. Thanks. GM ----- Message ----- From: Leta Baptist, PA-C Sent: 03/31/2023  11:26 AM EDT To: Lemar Lofty., MD  Hello!  Just wanted to touch base on any plan with him going forward.  They are planning to send him home today and wanted to be sure he stays on your/our radar.  Thank you,  Jess

## 2023-03-31 NOTE — Plan of Care (Signed)
Problem: Education: Goal: Ability to describe self-care measures that may prevent or decrease complications (Diabetes Survival Skills Education) will improve Outcome: Adequate for Discharge Goal: Individualized Educational Video(s) Outcome: Adequate for Discharge   Problem: Cardiac: Goal: Ability to maintain an adequate cardiac output will improve Outcome: Adequate for Discharge   Problem: Health Behavior/Discharge Planning: Goal: Ability to identify and utilize available resources and services will improve Outcome: Adequate for Discharge Goal: Ability to manage health-related needs will improve Outcome: Adequate for Discharge   Problem: Fluid Volume: Goal: Ability to achieve a balanced intake and output will improve Outcome: Adequate for Discharge   Problem: Metabolic: Goal: Ability to maintain appropriate glucose levels will improve Outcome: Adequate for Discharge   Problem: Nutritional: Goal: Maintenance of adequate nutrition will improve Outcome: Adequate for Discharge Goal: Maintenance of adequate weight for body size and type will improve Outcome: Adequate for Discharge   Problem: Respiratory: Goal: Will regain and/or maintain adequate ventilation Outcome: Adequate for Discharge   Problem: Urinary Elimination: Goal: Ability to achieve and maintain adequate renal perfusion and functioning will improve Outcome: Adequate for Discharge   Problem: Education: Goal: Knowledge of General Education information will improve Description: Including pain rating scale, medication(s)/side effects and non-pharmacologic comfort measures Outcome: Adequate for Discharge   Problem: Health Behavior/Discharge Planning: Goal: Ability to manage health-related needs will improve Outcome: Adequate for Discharge   Problem: Clinical Measurements: Goal: Ability to maintain clinical measurements within normal limits will improve Outcome: Adequate for Discharge Goal: Will remain free from  infection Outcome: Adequate for Discharge Goal: Diagnostic test results will improve Outcome: Adequate for Discharge Goal: Respiratory complications will improve Outcome: Adequate for Discharge Goal: Cardiovascular complication will be avoided Outcome: Adequate for Discharge   Problem: Activity: Goal: Risk for activity intolerance will decrease Outcome: Adequate for Discharge   Problem: Nutrition: Goal: Adequate nutrition will be maintained Outcome: Adequate for Discharge   Problem: Coping: Goal: Level of anxiety will decrease Outcome: Adequate for Discharge   Problem: Elimination: Goal: Will not experience complications related to bowel motility Outcome: Adequate for Discharge Goal: Will not experience complications related to urinary retention Outcome: Adequate for Discharge   Problem: Pain Managment: Goal: General experience of comfort will improve Outcome: Adequate for Discharge   Problem: Safety: Goal: Ability to remain free from injury will improve Outcome: Adequate for Discharge   Problem: Skin Integrity: Goal: Risk for impaired skin integrity will decrease Outcome: Adequate for Discharge   Problem: Education: Goal: Ability to describe self-care measures that may prevent or decrease complications (Diabetes Survival Skills Education) will improve Outcome: Adequate for Discharge Goal: Individualized Educational Video(s) Outcome: Adequate for Discharge   Problem: Coping: Goal: Ability to adjust to condition or change in health will improve Outcome: Adequate for Discharge   Problem: Fluid Volume: Goal: Ability to maintain a balanced intake and output will improve Outcome: Adequate for Discharge   Problem: Health Behavior/Discharge Planning: Goal: Ability to identify and utilize available resources and services will improve Outcome: Adequate for Discharge Goal: Ability to manage health-related needs will improve Outcome: Adequate for Discharge   Problem:  Metabolic: Goal: Ability to maintain appropriate glucose levels will improve Outcome: Adequate for Discharge   Problem: Nutritional: Goal: Maintenance of adequate nutrition will improve Outcome: Adequate for Discharge Goal: Progress toward achieving an optimal weight will improve Outcome: Adequate for Discharge   Problem: Skin Integrity: Goal: Risk for impaired skin integrity will decrease Outcome: Adequate for Discharge   Problem: Tissue Perfusion: Goal: Adequacy of tissue perfusion will improve  Outcome: Adequate for Discharge

## 2023-03-31 NOTE — Progress Notes (Addendum)
Progress Note  Primary GI: Dr. Meridee Score  LOS: 2 days   Chief Complaint:Epigastric pain in setting of recurrent pancreatitis/duct disruption s/p distal pancreatomy and splenectomy    Subjective  Patient up walking the halls.  He reports he is still having a lot of epigastric pain with eating. Currently on soft diet.  He has been receiving Dilaudid throughout the day about every 4-6 hours.  Patient reports his nausea is controlled with Zofran. No new issues. Discussed endoscopy results and plans to go home soon.  No family was present at the time of my evaluation.   Objective   Vital signs in last 24 hours: Temp:  [97 F (36.1 C)-98.2 F (36.8 C)] 97.7 F (36.5 C) (09/25 0513) Pulse Rate:  [44-54] 44 (09/25 0513) Resp:  [10-18] 18 (09/25 0513) BP: (103-132)/(57-89) 130/80 (09/25 0513) SpO2:  [94 %-100 %] 99 % (09/25 0513) Last BM Date : 03/29/23 Last BM recorded by nurses in past 5 days No data recorded  General:   male in no acute distress  Heart:  Regular rate and rhythm; no murmurs Pulm: Clear anteriorly; no wheezing Abdomen: soft, nondistended, normal bowel sounds in all quadrants. Generalized tenderness without guarding. No organomegaly appreciated. Extremities:  No edema Neurologic:  Alert and  oriented x4;  No focal deficits.  Psych:  Cooperative. Normal mood and affect.  Intake/Output from previous day: 09/24 0701 - 09/25 0700 In: 1200 [P.O.:900; I.V.:300] Out: 850 [Urine:850] Intake/Output this shift: No intake/output data recorded.  Studies/Results: No results found.  Lab Results: Recent Labs    03/28/23 1140 03/29/23 0430  WBC 10.1 10.5  HGB 16.2 15.2  HCT 48.4 46.0  PLT 453* 451*   BMET Recent Labs    03/28/23 1140 03/29/23 0430  NA 132* 136  K 4.3 4.7  CL 96* 99  CO2 24 27  GLUCOSE 473* 232*  BUN 11 11  CREATININE 0.97 0.68  CALCIUM 9.3 9.2   LFT Recent Labs    03/29/23 0430  PROT 6.4*  ALBUMIN 3.4*  AST 33  ALT 55*   ALKPHOS 417*  BILITOT 0.7   PT/INR No results for input(s): "LABPROT", "INR" in the last 72 hours.   Scheduled Meds:  enoxaparin (LOVENOX) injection  40 mg Subcutaneous Q24H   insulin aspart  0-15 Units Subcutaneous TID WC   insulin glargine-yfgn  15 Units Subcutaneous QHS   pantoprazole (PROTONIX) IV  40 mg Intravenous Q24H   Continuous Infusions:  lactated ringers 125 mL/hr at 03/30/23 1239   lactated ringers        Patient Narrative:   Steve Romero is a 52 y.o. 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 Freida Busman, EPI, chronic abdominal discomfort, colon polyps (TAs) .Presented to the ER with epigastric discomfort, nausea, diarrhea.    Impression/Plan:  Epigastric pain, nausea and history of complicated necrotizing pancreatitis in setting of gallstone/post ERCP with biliary obstruction and recurrent pseudocyst ultimately resulting in distal pancreatectomy and splenectomy due to PD disruption 11/14/2022. CT abdomen pelvis shows persistent fluid mild fat stranding of his pancreatic surgical bed slightly worse may represent acute pancreatitis. New 3.3 cm oval cystic structure abutting greater curvature of stomach interval decrease in size of 1.9 x 2.1 cm density abutting gastric fundus marrow present  pancreatic pseudocyst versus postsurgical collections. Lipase 99 AST 33 ALT 55 Alkphos 417 TBili 0.7 Afebrile, WBC 10.5 no leukocytosis-no signs of infection  Nausea/vomiting/weight loss most likely secondary to elevated Ha1c 14.9 and poorly controlled Diabetes. -Recommend endocrinologist referral outpt.   Pancreatic fluid  collections post distal pancreatectomy and splenectomy Afebrile, WBC 10.5 no leukocytosis-no signs of infection   Small mucosal enhancement wall thickening involving duodenum and proximal jejunum. History of gastric ulcer. EGD 07/30/2022 with nonbleeding gastric ulcer clean base, normal duodenum at that time. Repeat EGD 9/24 unremarkable.  Most likely secondary to pancreatitis/fluid collections/pseudocysts   EPI -CT with stool burden possibly secondary to tramadol/pain medication use. -Recommend plan for oral pain management for discharge per hospitalist   Type 2 diabetes with hyperglycemia. A1c 14.9 Continue per primary   Tobacco use/COPD Currently has nicotine patch Potentially contributing to recurrent pancreatitis   Thrombocytosis  likely secondary to inflammation  Principal Problem:   Acute on chronic pancreatitis [facility]) Active Problems:   Essential hypertension   Hyperlipidemia   Biliary stricture   Tobacco use disorder   Pancreatic pseudocyst   COPD (chronic obstructive pulmonary disease) (HCC)   History of pancreatectomy   Pancreatitis   Deanna J May  03/31/2023, 8:44 AM  GI Attending:  Agree w/ above.  He felt well enough to go home.  He will see Dr. Sable Feil in f/u and have MRI before then.  Iva Boop, MD, [facility] See Loretha Stapler on call - gastroenterology for best contact person 03/31/2023 4:45 PM '

## 2023-04-01 ENCOUNTER — Telehealth: Payer: Self-pay

## 2023-04-01 ENCOUNTER — Encounter (HOSPITAL_COMMUNITY): Payer: Self-pay | Admitting: Internal Medicine

## 2023-04-01 NOTE — Transitions of Care (Post Inpatient/ED Visit) (Signed)
04/01/2023  Name: Steve Romero MRN: [id number] DOB: March 17, 1971  Today's TOC FU Call Status: Today's TOC FU Call Status:: Successful TOC FU Call Completed TOC FU Call Complete Date: 04/01/23 Patient's Name and Date of Birth confirmed.  Transition Care Management Follow-up Telephone Call Date of Discharge: 03/31/23 Discharge Facility: [facility]) Type of Discharge: Inpatient Admission Primary Inpatient Discharge Diagnosis:: pancreatitis How have you been since you were released from the hospital?: Better Any questions or concerns?: No  Items Reviewed: Did you receive and understand the discharge instructions provided?: Yes Medications obtained,verified, and reconciled?: Yes (Medications Reviewed) Any new allergies since your discharge?: No Dietary orders reviewed?: Yes Do you have support at home?: No  Medications Reviewed Today: Medications Reviewed Today     Reviewed by Karena Addison, LPN (Licensed Practical Nurse) on 04/01/23 at 1453  Med List Status: <None>   Medication Order Taking? Sig Documenting Provider Last Dose Status Informant  acetaminophen (TYLENOL) 500 MG tablet [id number] No Take 2 tablets (1,000 mg total) by mouth every 8 (eight) hours as needed for mild pain. Fritzi Mandes, MD unk Active Self  Continuous Blood Gluc Receiver (FREESTYLE LIBRE READER) [place] [id number] No 1 Units by Does not apply route daily. UAD to test BGs daily. Dx E11.9 Raliegh Ip, DO Taking Active Self  Continuous Blood Gluc Sensor (FREESTYLE LIBRE 3 SENSOR) MISC [id number] No Place 1 sensor on the skin every 14 days. Use to check glucose continuously e11.9 Raliegh Ip, DO Past Month Active Self           Med Note Antony Madura, Arn Medal   Sun Mar 28, 2023  5:06 PM) No sensor is on at this time, however  esomeprazole (NEXIUM) 20 MG capsule [id number]  Take 1 capsule (20 mg total) by mouth daily. Arnetha Courser, MD  Active   Insulin Pen Needle (BD PEN NEEDLE NANO U/F) 32G X 4 MM  MISC [id number] No UAD to administer insulin Delynn Flavin M, DO Taking Active Self  lipase/protease/amylase (CREON) 36000 UNITS CPEP capsule [id number] No Take 3 capsules with meals and 1-2  capsule with snacks ( up too 2 snacks )  Patient taking differently: Take 36,000-108,000 Units by mouth See admin instructions. Take 108,000 units by mouth with each meal eaten and 36,000-72,000 units with each snack   Mansouraty, Netty Starring., MD 03/27/2023 pm Active Self  ondansetron (ZOFRAN) 4 MG tablet [id number]  Take 1 tablet (4 mg total) by mouth every 6 (six) hours as needed for nausea. Arnetha Courser, MD  Active   oxyCODONE 10 MG TABS [id number]  Take 1 tablet (10 mg total) by mouth every 6 (six) hours as needed for moderate pain. Arnetha Courser, MD  Active   rosuvastatin (CRESTOR) 10 MG tablet [id number] No Take 1 tablet (10 mg total) by mouth at bedtime. If you experience muscle aches take every other day  Patient not taking: Reported on 03/28/2023   Mallipeddi, Orion Modest, MD Not Taking Active Self  SEMGLEE, YFGN, 100 UNIT/ML Pen [id number] No Inject 26 Units into the skin at bedtime. [provider] 03/27/2023 pm Active Self  traMADol (ULTRAM) 50 MG tablet [id number] No Take 1 tablet (50 mg total) by mouth every 8 (eight) hours as needed.  Patient taking differently: Take 50 mg by mouth every 8 (eight) hours as needed (for pain).   Mansouraty, Netty Starring., MD unk Active Self            Home Care and Equipment/Supplies: Were Home Health  Services Ordered?: NA Any new equipment or medical supplies ordered?: NA  Functional Questionnaire: Do you need assistance with bathing/showering or dressing?: No Do you need assistance with meal preparation?: No Do you need assistance with eating?: No Do you have difficulty maintaining continence: No Do you need assistance with getting out of bed/getting out of a chair/moving?: No Do you have difficulty managing or taking your medications?: No  Follow  up appointments reviewed: PCP Follow-up appointment confirmed?: No (no appt avail , sent message to staff to schedule) MD Provider Line [phone] Given: No Specialist Hospital Follow-up appointment confirmed?: No Reason Specialist Follow-Up Not Confirmed: Patient has Specialist Provider Number and will Call for Appointment Do you need transportation to your follow-up appointment?: No Do you understand care options if your condition(s) worsen?: Yes-patient verbalized understanding    SIGNATURE Karena Addison, LPN [facility] Nurse Health Advisor Direct Dial [phone]

## 2023-04-02 ENCOUNTER — Telehealth: Payer: Self-pay

## 2023-04-02 NOTE — Patient Outreach (Signed)
Care Coordination   Note   04/02/2023 Name: Steve Romero MRN: [id number] DOB: [birth year]  Received message back from Dr. Nadine Counts regarding patient being dc on Semglee, not Lantus.  She noted she was okay with patient being on Semglee, but thought there was an insurance implication. Reviewed patients prescription benefits and Steve Romero has Semglee as a 2nd Tier medication and would need a prior authorization, where Lanus did not need pre certification.  Dr. Nadine Counts agreed patient should stay on Lantus and they can discuss at his hospital follow up.   Call placed to patient and he agreed with the plan to stay on the Lantus for now and he will discuss at his hospital follow up. Jodelle Gross RN, BSN, CCM [facility] RN Care Coordinator/ Transitions of Care Direct Dial: [phone]  Fax: [phone]

## 2023-04-02 NOTE — Transitions of Care (Post Inpatient/ED Visit) (Signed)
04/02/2023  Name: Steve Romero MRN: [id number] DOB: [birth year]  Today's TOC FU Call Status: Today's TOC FU Call Status:: Successful TOC FU Call Completed TOC FU Call Complete Date: 04/02/23 Patient's Name and Date of Birth confirmed.  Transition Care Management Follow-up Telephone Call Date of Discharge: 03/31/23 Dis[facility]) Type of Discharge: Inpatient Admission Primary Inpatient Discharge Diagnosis:: Pancreatitis How have you been since you were released from the hospital?: Better Any questions or concerns?: Yes Patient Questions/Concerns:: Patient discharge instructions have him start Semglee and d/c Lantus.  Patient did not receive prescription for [facility] and is using Lantus. Patient Questions/Concerns Addressed: Notified Provider of Patient Questions/Concerns  Items Reviewed: Did you receive and understand the discharge instructions provided?: Yes Medications obtained,verified, and reconciled?: Yes (Medications Reviewed) Any new allergies since your discharge?: No Dietary orders reviewed?: Yes Type of Diet Ordered:: Low sodium, carbohydrate controlled Do you have support at home?: No  Medications Reviewed Today: Medications Reviewed Today     Reviewed by Jodelle Gross, RN (Case Manager) on 04/02/23 at 1009  Med List Status: <None>   Medication Order Taking? Sig Documenting Provider Last Dose Status Informant  acetaminophen (TYLENOL) 500 MG tablet [id number] Yes Take 2 tablets (1,000 mg total) by mouth every 8 (eight) hours as needed for mild pain. Fritzi Mandes, MD Taking Active Self  Continuous Blood Gluc Receiver (FREESTYLE LIBRE READER) [place] [id number] Yes 1 Units by Does not apply route daily. UAD to test BGs daily. Dx E11.9 Raliegh Ip, DO Taking Active Self  Continuous Blood Gluc Sensor (FREESTYLE LIBRE 3 SENSOR) MISC [id number] No Place 1 sensor on the skin every 14 days. Use to check glucose continuously e11.9  Patient not  taking: Reported on 04/02/2023   Raliegh Ip, DO Not Taking Active Self           Med Note Antony Madura, Dyane Dustman Mar 28, 2023  5:06 PM) No sensor is on at this time, however  esomeprazole (NEXIUM) 20 MG capsule [id number] Yes Take 1 capsule (20 mg total) by mouth daily. Arnetha Courser, MD Taking Active   Insulin Pen Needle (BD PEN NEEDLE NANO U/F) 32G X 4 MM MISC [id number] Yes UAD to administer insulin Delynn Flavin M, DO Taking Active Self  lipase/protease/amylase (CREON) 36000 UNITS CPEP capsule [id number] Yes Take 3 capsules with meals and 1-2  capsule with snacks ( up too 2 snacks )  Patient taking differently: Take 36,000-108,000 Units by mouth See admin instructions. Take 108,000 units by mouth with each meal eaten and 36,000-72,000 units with each snack   Mansouraty, Netty Starring., MD Taking Active Self  ondansetron (ZOFRAN) 4 MG tablet [id number] Yes Take 1 tablet (4 mg total) by mouth every 6 (six) hours as needed for nausea. Arnetha Courser, MD Taking Active   oxyCODONE 10 MG TABS [id number] Yes Take 1 tablet (10 mg total) by mouth every 6 (six) hours as needed for moderate pain. Arnetha Courser, MD Taking Active   rosuvastatin (CRESTOR) 10 MG tablet [id number] Yes Take 1 tablet (10 mg total) by mouth at bedtime. If you experience muscle aches take every other day Mallipeddi, Vishnu P, MD Taking Active Self  SEMGLEE, YFGN, 100 UNIT/ML Pen [id number] No Inject 26 Units into the skin at bedtime.  Patient not taking: Reported on 04/02/2023   [provider] Not Taking Active Self           Med Note Electa Sniff [facility]   Fri Apr 02, 2023  10:09 AM) Patient taking Lantus as he did not get prescription for Semglee  traMADol (ULTRAM) 50 MG tablet [id number] Yes Take 1 tablet (50 mg total) by mouth every 8 (eight) hours as needed.  Patient taking differently: Take 50 mg by mouth every 8 (eight) hours as needed (for pain).   Mansouraty, Netty Starring., MD Taking Active Self            Home  Care and Equipment/Supplies: Were Home Health Services Ordered?: No Any new equipment or medical supplies ordered?: No  Functional Questionnaire: Do you need assistance with bathing/showering or dressing?: No Do you need assistance with meal preparation?: No Do you need assistance with eating?: No Do you have difficulty maintaining continence: No Do you need assistance with getting out of bed/getting out of a chair/moving?: No Do you have difficulty managing or taking your medications?: No  Follow up appointments reviewed: PCP Follow-up appointment confirmed?: Yes MD Provider Line Number:[phone] Given: Yes Date of PCP follow-up appointment?: 04/16/23 Follow-up Provider: [facility] Specialist Hospital Follow-up appointment confirmed?: Yes Date of Specialist follow-up appointment?: 06/08/23 Follow-Up Specialty Provider:: Dr. Meridee Score Do you need transportation to your follow-up appointment?: No Do you understand care options if your condition(s) worsen?: Yes-patient verbalized understanding  SDOH Interventions Today    Flowsheet Row Most Recent Value  SDOH Interventions   Food Insecurity Interventions Intervention Not Indicated  Housing Interventions Intervention Not Indicated  Transportation Interventions Intervention Not Indicated      TOC Interventions Today    Flowsheet Row Most Recent Value  TOC Interventions   TOC Interventions Discussed/Reviewed TOC Interventions Discussed, TOC Interventions Reviewed, Contacted provider for patient needs  [Explained to patient he has the [employer] and he has a case Production designer, theatre/television/film through that plan.  Provided him with the phone number ([phone]) to reach the Case Manager.]     Message sent to PCP about patients Semglee Insulin he was suppose to start upon dc from hospital.  Plan to call patient when response received.   Jodelle Gross RN, BSN, CCM [facility] RN Care Coordinator/ Transitions of  Care Direct Dial: [phone]  Fax: [phone]

## 2023-04-07 ENCOUNTER — Ambulatory Visit (HOSPITAL_COMMUNITY): Payer: MEDICAID

## 2023-04-13 MED ORDER — LORAZEPAM 1 MG PO TABS: 1.0000 mg | ORAL_TABLET | ORAL | 0 refills | Status: DC

## 2023-04-13 NOTE — Telephone Encounter (Signed)
Dr Meridee Score can you please review for claustra phobia concerns with MRI.

## 2023-04-13 NOTE — Telephone Encounter (Signed)
Steve Romero, I believe we have given him Ativan in the past. He can take 1 mg of Ativan earlier in the day and have another 1 mg of Ativan available within 1 hour of the MRI/MRCP. (Prescription = Ativan 1 mg twice daily as needed (2/0)) Thanks. GM

## 2023-04-13 NOTE — Addendum Note (Signed)
Addended by: Loretha Stapler on: 04/13/2023 11:56 AM   Modules accepted: Orders

## 2023-04-16 ENCOUNTER — Encounter: Payer: Self-pay | Admitting: Family Medicine

## 2023-04-16 ENCOUNTER — Ambulatory Visit: Payer: MEDICAID | Admitting: Family Medicine

## 2023-04-16 ENCOUNTER — Telehealth: Payer: Self-pay | Admitting: Family Medicine

## 2023-04-16 VITALS — BP 112/64 | HR 68 | Temp 98.7°F | Ht 69.0 in | Wt 177.0 lb

## 2023-04-16 DIAGNOSIS — Z09 Encounter for follow-up examination after completed treatment for conditions other than malignant neoplasm: Secondary | ICD-10-CM | POA: Diagnosis not present

## 2023-04-16 DIAGNOSIS — K859 Acute pancreatitis without necrosis or infection, unspecified: Secondary | ICD-10-CM | POA: Diagnosis not present

## 2023-04-16 DIAGNOSIS — E1165 Type 2 diabetes mellitus with hyperglycemia: Secondary | ICD-10-CM | POA: Diagnosis not present

## 2023-04-16 DIAGNOSIS — E119 Type 2 diabetes mellitus without complications: Secondary | ICD-10-CM | POA: Diagnosis not present

## 2023-04-16 DIAGNOSIS — Z794 Long term (current) use of insulin: Secondary | ICD-10-CM

## 2023-04-16 DIAGNOSIS — K861 Other chronic pancreatitis: Secondary | ICD-10-CM

## 2023-04-16 MED ORDER — SEMGLEE (YFGN) 100 UNIT/ML ~~LOC~~ SOPN: 30.0000 [IU] | PEN_INJECTOR | Freq: Every day | SUBCUTANEOUS | 3 refills | Status: DC

## 2023-04-16 MED ORDER — NOVOLOG FLEXPEN 100 UNIT/ML ~~LOC~~ SOPN: PEN_INJECTOR | SUBCUTANEOUS | 11 refills | Status: DC

## 2023-04-16 MED ORDER — OXYCODONE HCL 10 MG PO TABS: 10.0000 mg | ORAL_TABLET | Freq: Four times a day (QID) | ORAL | 0 refills | Status: DC | PRN

## 2023-04-16 NOTE — Patient Instructions (Addendum)
Increase Semglee to 30 units. Sliding scale insulin provided for mealtime use. Use when you sit down for your meal. See Raynelle Fanning ASAP.

## 2023-04-16 NOTE — Progress Notes (Signed)
Subjective: Steve Romero follow up PCP: Steve Ip, DO Steve Romero is a 52 y.o. male presenting to clinic today for:  Acute on chronic pancreatitis Patient was hospitalized for acute on chronic pancreatitis.  He was apparently found to have a new cyst.  He will be following up with gastroenterology in December.  Continues to have some abdominal pain but notes that the Zofran and Nexium seem to be helping it a little bit.  He has oxycodone on hand if needed but is running low and would like to have some on hand if needed.  Reports regular use of MiraLAX to keep bowel movements regular.  Last BM yesterday.  No blood in stool.  Tolerating fluids without difficulty.  He is currently injecting 26 units of Semglee daily.  Reports morning blood sugars are running around 160s typically.  He "just reads high" most of the rest of the day.  Utilizing CGM for continuous monitoring of blood sugar.   ROS: Per HPI  Allergies  Allergen Reactions   Desipramine Nausea Only and Rash   Past Medical History:  Diagnosis Date   Acute pancreatitis without necrosis or infection, unspecified    AKI (acute kidney injury) (HCC) 05/17/2022   Anxiety 10/2014   Ascites    Bipolar disorder (HCC) age 7   COPD (chronic obstructive pulmonary disease) (HCC) 2016   Depression age 59   Enlarged prostate    Hyperlipidemia 2016   Hypertension 2013   Nausea vomiting and diarrhea 06/15/2022   Schizoaffective disorder (HCC) 11/13/2014   Sleep apnea    Does not wear c-pap, sleeps in sitting up position per pt   Thyroid disease 11/2014    Current Outpatient Medications:    acetaminophen (TYLENOL) 500 MG tablet, Take 2 tablets (1,000 mg total) by mouth every 8 (eight) hours as needed for mild pain., Disp: 30 tablet, Rfl: 0   Continuous Blood Gluc Receiver (FREESTYLE LIBRE READER) [place], 1 Units by Does not apply route daily. UAD to test BGs daily. Dx E11.9, Disp: 1 each, Rfl: 1   Continuous Blood Gluc  Sensor (FREESTYLE LIBRE 3 SENSOR) MISC, Place 1 sensor on the skin every 14 days. Use to check glucose continuously e11.9 (Patient not taking: Reported on 04/02/2023), Disp: 6 each, Rfl: 3   esomeprazole (NEXIUM) 20 MG capsule, Take 1 capsule (20 mg total) by mouth daily., Disp: 30 capsule, Rfl: 1   Insulin Pen Needle (BD PEN NEEDLE NANO U/F) 32G X 4 MM MISC, UAD to administer insulin, Disp: 100 each, Rfl: 3   lipase/protease/amylase (CREON) 36000 UNITS CPEP capsule, Take 3 capsules with meals and 1-2  capsule with snacks ( up too 2 snacks ) (Patient taking differently: Take 36,000-108,000 Units by mouth See admin instructions. Take 108,000 units by mouth with each meal eaten and 36,000-72,000 units with each snack), Disp: 570 capsule, Rfl: 2   LORazepam (ATIVAN) 1 MG tablet, Take 1 tablet (1 mg total) by mouth as directed. The morning of procedure and 1 tablet at 1 hour before the procedure, Disp: 2 tablet, Rfl: 0   ondansetron (ZOFRAN) 4 MG tablet, Take 1 tablet (4 mg total) by mouth every 6 (six) hours as needed for nausea., Disp: 20 tablet, Rfl: 0   oxyCODONE 10 MG TABS, Take 1 tablet (10 mg total) by mouth every 6 (six) hours as needed for moderate pain., Disp: 30 tablet, Rfl: 0   rosuvastatin (CRESTOR) 10 MG tablet, Take 1 tablet (10 mg total) by mouth at bedtime.  If you experience muscle aches take every other day, Disp: 90 tablet, Rfl: 1   SEMGLEE, YFGN, 100 UNIT/ML Pen, Inject 26 Units into the skin at bedtime. (Patient not taking: Reported on 04/02/2023), Disp: , Rfl:    traMADol (ULTRAM) 50 MG tablet, Take 1 tablet (50 mg total) by mouth every 8 (eight) hours as needed. (Patient taking differently: Take 50 mg by mouth every 8 (eight) hours as needed (for pain).), Disp: 60 tablet, Rfl: 0 Social History   Socioeconomic History   Marital status: Single    Spouse name: Not on file   Number of children: 0   Years of education: Not on file   Highest education level: Not on file  Occupational  History   Not on file  Tobacco Use   Smoking status: Every Day    Current packs/day: 0.25    Average packs/day: 0.3 packs/day for 26.0 years (6.5 ttl pk-yrs)    Types: Cigarettes   Smokeless tobacco: Never   Tobacco comments:    4-5 cig daily as of 10/07/2020.  Vaping Use   Vaping status: Never Used  Substance and Sexual Activity   Alcohol use: No   Drug use: No   Sexual activity: Never    Birth control/protection: None  Other Topics Concern   Not on file  Social History Narrative   Not on file   Social Determinants of Health   Financial Resource Strain: Not on file  Food Insecurity: No Food Insecurity (04/02/2023)   Hunger Vital Sign    Worried About Running Out of Food in the Last Year: Never true    Ran Out of Food in the Last Year: Never true  Transportation Needs: No Transportation Needs (04/02/2023)   PRAPARE - Administrator, Civil Service (Medical): No    Lack of Transportation (Non-Medical): No  Physical Activity: Not on file  Stress: Not on file  Social Connections: Not on file  Intimate Partner Violence: Not At Risk (03/28/2023)   Humiliation, Afraid, Rape, and Kick questionnaire    Fear of Current or Ex-Partner: No    Emotionally Abused: No    Physically Abused: No    Sexually Abused: No   Family History  Problem Relation Age of Onset   Diabetes Father    Heart disease Father    Heart disease Maternal Grandmother    Stroke Maternal Grandfather    Colon cancer Neg Hx    Esophageal cancer Neg Hx    Inflammatory bowel disease Neg Hx    Liver disease Neg Hx    Pancreatic cancer Neg Hx    Rectal cancer Neg Hx    Stomach cancer Neg Hx     Objective: Office vital signs reviewed. BP 112/64   Pulse 68   Temp 98.7 F (37.1 C)   Ht 5\' 9"  (1.753 m)   Wt 177 lb (80.3 kg)   SpO2 93%   BMI 26.14 kg/m   Physical Examination:  General: Awake, alert, nontoxic male, No acute distress HEENT: Sclera white.  Moist mucous membranes.  Teeth are  absent. GI: Postsurgical scar noted midline.  He has some epigastric tenderness palpation but no rebound or guarding.  Abdomen is flat, soft.  Assessment/ Plan: 52 y.o. male   Acute on chronic pancreatitis (HCC) - Plan: CBC, CMP14+EGFR, Oxycodone HCl 10 MG TABS  Hospital discharge follow-up - Plan: CBC, CMP14+EGFR  Uncontrolled type 2 diabetes mellitus with hyperglycemia (HCC) - Plan: insulin aspart (NOVOLOG FLEXPEN) 100 UNIT/ML FlexPen,  SEMGLEE, YFGN, 100 UNIT/ML Pen  Insulin dependent type 2 diabetes mellitus (HCC) - Plan: insulin aspart (NOVOLOG FLEXPEN) 100 UNIT/ML FlexPen, SEMGLEE, YFGN, 100 UNIT/ML Pen  Check CBC, CMP.  Continues to have mild epigastric pain on exam.  Oxycodone renewed for as needed use.  National narcotic database reviewed and there were no red flags  I have added mealtime sliding scale insulin.  Increase Semglee to 30 units at bedtime.  May need to increase further pending review of freestyle libre machine.  He did not have his reader today.  Will see Raynelle Fanning ASAP for review in the next 1 to 2 weeks.  I will CC chart to her as well   No orders of the defined types were placed in this encounter.  No orders of the defined types were placed in this encounter.  Steve Ip, DO [facility] [phone]

## 2023-04-16 NOTE — Telephone Encounter (Signed)
Let's try to squeeze him in on a tues 10/22 if able! Thanks!

## 2023-04-17 LAB — CBC
Hematocrit: 46.3 % (ref 37.5–51.0)
Hemoglobin: 14.8 g/dL (ref 13.0–17.7)
MCH: 28.7 pg (ref 26.6–33.0)
MCHC: 32 g/dL (ref 31.5–35.7)
MCV: 90 fL (ref 79–97)
Platelets: 442 10*3/uL (ref 150–450)
RBC: 5.15 x10E6/uL (ref 4.14–5.80)
RDW: 14.9 % (ref 11.6–15.4)
WBC: 11.3 10*3/uL — ABNORMAL HIGH (ref 3.4–10.8)

## 2023-04-17 LAB — CMP14+EGFR
ALT: 24 [IU]/L (ref 0–44)
AST: 15 [IU]/L (ref 0–40)
Albumin: 4 g/dL (ref 3.8–4.9)
Alkaline Phosphatase: 299 [IU]/L — ABNORMAL HIGH (ref 44–121)
BUN/Creatinine Ratio: 13 (ref 9–20)
BUN: 13 mg/dL (ref 6–24)
Bilirubin Total: <0.2 mg/dL (ref 0.0–1.2)
CO2: 23 mmol/L (ref 20–29)
Calcium: 9.7 mg/dL (ref 8.7–10.2)
Chloride: 96 mmol/L (ref 96–106)
Creatinine, Ser: 1.01 mg/dL (ref 0.76–1.27)
Globulin, Total: 2.2 g/dL (ref 1.5–4.5)
Sodium: 135 mmol/L (ref 134–144)
Total Protein: 6.2 g/dL (ref 6.0–8.5)
eGFR: 89 mL/min/{1.73_m2} (ref >59)

## 2023-04-20 ENCOUNTER — Telehealth: Payer: Self-pay | Admitting: *Deleted

## 2023-04-20 DIAGNOSIS — E119 Type 2 diabetes mellitus without complications: Secondary | ICD-10-CM

## 2023-04-20 MED ORDER — FREESTYLE LIBRE 3 PLUS SENSOR MISC: 3 refills | Status: DC

## 2023-04-20 NOTE — Addendum Note (Signed)
Addended by: Raliegh Ip on: 04/20/2023 09:16 AM   Modules accepted: Orders

## 2023-04-20 NOTE — Telephone Encounter (Signed)
Fax from [facility] RE: Steve Romero Note: not available Please change to Steve Romero

## 2023-04-23 ENCOUNTER — Telehealth: Payer: Self-pay

## 2023-04-23 NOTE — Telephone Encounter (Signed)
Gearldine Bienenstock (KeyAlphonzo [place]) PA Case ID #: [id number] Rx #: [id number] Need Help? Call us at [phone] Status sent iconSent to Plan today Drug oxyCODONE HCl 10MG  tablets ePA cloud logo Form [employer] ePA Form [phone] NCPDP) Original Claim Info 54

## 2023-04-26 ENCOUNTER — Other Ambulatory Visit (HOSPITAL_COMMUNITY): Payer: Self-pay

## 2023-04-26 ENCOUNTER — Telehealth: Payer: Self-pay | Admitting: Family Medicine

## 2023-04-26 DIAGNOSIS — K859 Acute pancreatitis without necrosis or infection, unspecified: Secondary | ICD-10-CM

## 2023-04-26 MED ORDER — OXYCODONE HCL 10 MG PO TABS: 10.0000 mg | ORAL_TABLET | Freq: Four times a day (QID) | ORAL | 0 refills | Status: DC | PRN

## 2023-04-26 NOTE — Telephone Encounter (Signed)
Pharmacy called stating that they were not able to fill pts Oxycodone Rx for what it was sent in for but says if it can be rewritten then they can give pt 15 tablets to take 3x per day for 5 days for pain. Please advise.

## 2023-04-26 NOTE — Telephone Encounter (Signed)
I want him to have it available as QID, hence that's what I wrote it as.  I will reduce the qty, as I suspect that is their issue with the rx since it is an acute on chronic issue for this patient.  Meds ordered this encounter  Medications   Oxycodone HCl 10 MG TABS    Sig: Take 1 tablet (10 mg total) by mouth every 6 (six) hours as needed.    Dispense:  20 tablet    Refill:  0

## 2023-04-27 ENCOUNTER — Ambulatory Visit: Payer: MEDICAID | Admitting: Pharmacist

## 2023-04-27 DIAGNOSIS — Z794 Long term (current) use of insulin: Secondary | ICD-10-CM

## 2023-04-27 DIAGNOSIS — E1165 Type 2 diabetes mellitus with hyperglycemia: Secondary | ICD-10-CM | POA: Diagnosis not present

## 2023-04-27 DIAGNOSIS — E119 Type 2 diabetes mellitus without complications: Secondary | ICD-10-CM | POA: Diagnosis not present

## 2023-04-27 MED ORDER — NOVOLOG FLEXPEN 100 UNIT/ML ~~LOC~~ SOPN: PEN_INJECTOR | SUBCUTANEOUS | 11 refills | Status: DC

## 2023-04-27 NOTE — Patient Instructions (Signed)
It was great to see you today!  Please increase your Semglee (insulin glargine) to 35 units once daily  START taking meal time insulin aspart (Novolog) 5 units with meals. Take the dose 5-10 minutes before a meal, or when you start eating.   Apply a new sensor, and we will take a look at the readings when you come back.   Let us know if you have any questions or concerns.

## 2023-04-27 NOTE — Progress Notes (Signed)
04/27/2023 Name: Steve Romero MRN: [id number] DOB: 08/15/1970  Chief Complaint  Patient presents with   Diabetes    HO HERGENREDER is a 52 y.o. year old male who was referred for medication management by their primary care provider, Delynn Flavin M, DO. They presented for a face to face visit today.   They were referred to the pharmacist by their PCP for assistance in managing diabetes  PMH includes T2DM (diagnosed Mar 2024 2/2 recurrent pancreatitis, partial pancreatomy), chronic pancreatitis, HTN, COPD, elevated CAC.  Subjective: Pt is doing ok. Notes that BG dramatically worsened around time of last hospitalization. He does not have his FL3 reader with him today - last wore a sensor 3-4 days ago. States that [facility] was out of FL3 sensors, but now they have the rx ready for him (appears that FL3 plus was sent). He also has not been able to pick up and start taking meal time insulin aspart, because [facility] was unable to fill without a frequency.  States that he feels ok. His medications are working to control his nausea. Is endorsing symptoms of high BG as described below.   Care Team: Primary Care Provider: Raliegh Ip, DO ; Next Scheduled Visit: 07/28/23 Gastroenterology: [facility]; Next Scheduled Visit: 06/08/23  Medication Access/Adherence  Current Pharmacy:  [facility] [address], [place] - [address] [place] [address] Phone: [phone] Fax: [phone]   Patient reports affordability concerns with their medications: No  Patient reports access/transportation concerns to their pharmacy: No  - has a ride to take him to appointments/pharmacy Patient reports adherence concerns with their medications:  No    Denies missed doses of medications. Fill hx good.   Diabetes:  Current medications: insulin glargine (Semglee) 30 units daily, insulin aspart (Novolog) sliding scale with meals: 150-200: 3 units, 201-250: 4  units, 251-300: 5 units, 301-350: 6 units, 351-400: 7 units, call office if over 400 (has not been able to start taking sliding scale because pharmacy unable to fill without frequency) Medications tried in the past: none  Current glucose readings: FBG: 180-190 to 200, evenings/afternoons: 320, 330, 400. Has gotten much worse since most recent hospitalization.   Using Accu-Chek  meter; testing 1-2 times daily (when he is not wearing his FL3)  When he was wearing FL3 reports that the sensor usually would just read "HI" throughout the day.   Patient denies hypoglycemic s/sx including dizziness, shakiness, sweating. Patient reports hyperglycemic symptoms including nocturia (every hour at night), neuropathy, polydipsia at night), blurred vision (saw eye doctor ~1-2 mo ago).   Pt endorses recent weight loss.   Current meal patterns: Usually eat 1 main meal (evening), otherwise snacks throughout the day - Breakfast: occasional biscuit, sausage, egg  - Supper :Chicken or beef, vegetables - occasionally with a biscuit or bread - Snacks: pack of tuna, crackers, can of beans  - Drinks: drinking water mostly, occasional half/half teas  Cut down on biscuit, potatoes, rice   Hyperlipidemia/ASCVD Risk Reduction  Current lipid lowering medications: rosuvastatin 10 mg PO daily (started Aug 2024)  ASCVD History: 12/30/22 > CAC score 35 (77th percentile), mild calcified plaque in RCA (non-obstructive) Family History: Heart disease in Father/Sister Risk Factors: tobacco use, T2DM, elevated CAC  Clinical ASCVD: No  The 10-year ASCVD risk score (Arnett DK, et al., 2019) is: 19.9%   Values used to calculate the score:     Age: 57 years     Sex: Male  Is Non-Hispanic African American: No     Diabetic: Yes     Tobacco smoker: Yes     Systolic Blood Pressure: 112 mmHg     Is BP treated: No     HDL Cholesterol: 25 mg/dL     Total Cholesterol: 169 mg/dL   Objective:  Lab Results  Component Value  Date   HGBA1C 14.9 (H) 03/30/2023    Lab Results  Component Value Date   CREATININE 1.01 04/16/2023   BUN 13 04/16/2023   NA 135 04/16/2023   K CANCELED 04/16/2023   CL 96 04/16/2023   CO2 23 04/16/2023    Lab Results  Component Value Date   CHOL 169 12/29/2022   HDL 25 (L) 12/29/2022   LDLCALC 124 (H) 12/29/2022   TRIG 98 12/29/2022   CHOLHDL 6.8 12/29/2022    Medications Reviewed Today     Reviewed by Particia Lather, RPH (Pharmacist) on 04/27/23 at 0929  Med List Status: <None>   Medication Order Taking? Sig Documenting Provider Last Dose Status Informant  acetaminophen (TYLENOL) 500 MG tablet [id number]  Take 2 tablets (1,000 mg total) by mouth every 8 (eight) hours as needed for mild pain. Fritzi Mandes, MD  Active Self  Continuous Blood Gluc Receiver (FREESTYLE LIBRE READER) DEVI [id number]  1 Units by Does not apply route daily. UAD to test BGs daily. Dx E11.9 Raliegh Ip, DO  Active Self  Continuous Glucose Sensor (FREESTYLE LIBRE 3 PLUS SENSOR) MISC [id number]  Check BGs continuously. E11.9 Change sensor every 15 days. Delynn Flavin M, DO  Active   esomeprazole (NEXIUM) 20 MG capsule [id number] Yes Take 1 capsule (20 mg total) by mouth daily. Arnetha Courser, MD Taking Active   insulin aspart (NOVOLOG FLEXPEN) 100 UNIT/ML FlexPen [id number]  Inject 5-10 units into the skin twice daily with breakfast and dinner. Delynn Flavin M, DO  Active   Insulin Pen Needle (BD PEN NEEDLE NANO U/F) 32G X 4 MM MISC [id number]  UAD to administer insulin Delynn Flavin M, DO  Active Self  lipase/protease/amylase (CREON) 36000 UNITS CPEP capsule [id number] Yes Take 3 capsules with meals and 1-2  capsule with snacks ( up too 2 snacks )  Patient taking differently: Take 36,000-108,000 Units by mouth See admin instructions. Take 108,000 units by mouth with each meal eaten and 36,000-72,000 units with each snack   Mansouraty, Netty Starring., MD Taking Active Self  LORazepam (ATIVAN) 1 MG  tablet [id number] No Take 1 tablet (1 mg total) by mouth as directed. The morning of procedure and 1 tablet at 1 hour before the procedure  Patient not taking: Reported on 04/27/2023   Mansouraty, Netty Starring., MD Not Taking Active   ondansetron [facility]) 4 MG tablet [id number] Yes Take 1 tablet (4 mg total) by mouth every 6 (six) hours as needed for nausea. Arnetha Courser, MD Taking Active   Oxycodone HCl 10 MG TABS [id number] Yes Take 1 tablet (10 mg total) by mouth every 6 (six) hours as needed. Delynn Flavin M, DO Taking Active   rosuvastatin (CRESTOR) 10 MG tablet [id number] Yes Take 1 tablet (10 mg total) by mouth at bedtime. If you experience muscle aches take every other day Mallipeddi, Vishnu P, MD Taking Active Self  SEMGLEE, YFGN, 100 UNIT/ML Pen [id number] Yes Inject 30-40 Units into the skin at bedtime. Raliegh Ip, DO Taking Active               Assessment/Plan:   Diabetes: - Currently uncontrolled  with last A1c of 14.9% above goal <7% and worsened from previous A1c of 8.3%. Pt is insulin-dependent due to chronic pancreatitis, partial pancreatomy. Had recent hospitalization for acute pancreatitis, noted to have ketones and BG of 456 despite adherence to once daily insulin. Incretin mimetics are C/I. Appropriate to treat pt with basal and prandial insulin. Increased doses of insulin warranted.  - Reviewed long term cardiovascular and renal outcomes of uncontrolled blood sugar - Reviewed goal A1c, goal fasting, and goal 2 hour post prandial glucose - Educated pt on signs and symptoms of hypoglycemia and rule of 15 . - Reviewed dietary modifications including reducing intake of carbohydrate-rich foods like bread, pasta, rice; increasing water intake; prioritizing protein and vegetables.  - Recommend to INCREASE basal insulin glargine (Semglee) to 35 units subcutaneous once daily - Recommend to START prandial insulin aspart (Novolog) 5 units subcutaneous twice daily with  largest meals of the day (breakfast and supper) - Recommend to check glucose continuously with FL3 + sensor and reader - Pt is appropriately treated with a high-intensity statin - Recommend obtaining UACR at PCP f/u  Follow Up Plan: Pharmacist in-person 05/18/23, PCP 07/28/23  Nils Pyle, PharmD PGY1 Pharmacy Resident   Kieth Brightly, PharmD, BCACP, CPP Clinical Pharmacist, [facility]

## 2023-05-17 NOTE — Progress Notes (Unsigned)
05/18/2023 Name: Steve Romero MRN: [id number] DOB: Mar 27, 1971  Chief Complaint  Patient presents with   Diabetes    Steve Romero is a 52 y.o. year old male who was referred for medication management by their primary care provider, Delynn Flavin M, DO. They presented for a face to face visit today.   They were referred to the pharmacist by their PCP for assistance in managing diabetes  PMH includes T2DM (diagnosed Mar 2024 2/2 recurrent pancreatitis, partial pancreatomy with acute necrotizing pancreatitis), chronic pancreatitis, HTN, COPD, elevated CAC, splenectomy.  Subjective: Pt is doing ok. Pt still has not been able to pick up FL3 sensors, states that [facility] has not gotten them ready. He has not worn a sensor in ~1 mo. He was able to pick up his meal time insulin. He has an appt to receive his meningococcal vaccines today. BG and A1c acutely worsened after last hospitalization in September for recurrent pancreatitis.   He was seen for surgery f/u s/p pancreatomy on 05/06/23 - he reported eating regular meals and drinking 2-3 protein shakes per day. Noted to have recurrent peripancreatic fluid collection adjacent to stomach and a very small remnant of pancreas - which they will follow-up on MRI next month. Discussed potential need for feeding tube if weight loss continues. Pt has GI f/u in Dec.   Care Team: Primary Care Provider: Raliegh Ip, DO ; Next Scheduled Visit: 07/28/23 Gastroenterology: [facility]; Next Scheduled Visit: 06/08/23  Medication Access/Adherence  Current Pharmacy:  [facility] [address] [place] - [address] [place] [address] Phone: [phone] Fax: [phone]   Patient reports affordability concerns with their medications: No  Patient reports access/transportation concerns to their pharmacy: No  - has a ride to take him to appointments/pharmacy Patient reports adherence concerns with their  medications:  No    Denies missed doses of medications. Fill hx good. Pt states that medications are affordable.   Diabetes:  Current medications: insulin glargine (Semglee) 35 units daily, insulin aspart (Novolog) 5 units with meals (~2 meals/day) Medications tried in the past: none  Current glucose readings: yesterday: FBG 166, 5-6PM: 345 (after eating ~4PM chicken, green beans, collard greens, biscuit)   FBG: 180-190 to 200, evenings/afternoons: 320, 330, 400. Has gotten much worse since most recent hospitalization.   Using Accu-Chek  meter; testing intermittently 1-2 times daily. Would prefer to wear FL3 sensor if [facility] is able to fill. When he was wearing sensor previously it would just read "HI" throughout the day.   Patient denies hypoglycemic s/sx including dizziness, shakiness, sweating. Patient reports hyperglycemic symptoms including nocturia (every hour at night), neuropathy, polydipsia at night), blurred vision (saw eye doctor ~1-2 mo ago).   Pt endorses recent weight loss.   Current meal patterns: Usually eat 1-2 main meal (evening), otherwise snacks throughout the day - Breakfast: occasional biscuit, sausage, egg  - Supper :Chicken or beef, vegetables - occasionally with a biscuit or bread - Snacks: pack of tuna, crackers, can of beans  - Drinks: drinking water mostly, occasional half/half teas - has a regular mountain dew ~every other day.  Drinking 2-3 Ensure protein shakes during the day- states he buys the ones with 13 g of protein. Unsure how many carbohydrates- possibly 3 - instructed pt to double check.   Has cut down on biscuit, potatoes, rice. Pt reports that he has been trying to implement the healthy plate method of filling 1/2 plate with green vegetables  and other quarter with protein and small serving of carbohydrates.   Hyperlipidemia/ASCVD Risk Reduction  Current lipid lowering medications: rosuvastatin 10 mg PO every other day (started Aug 2024) -  recently decreased to every other day due to myalgias.   ASCVD History: 12/30/22 > CAC score 35 (77th percentile), mild calcified plaque in RCA (non-obstructive) Family History: Heart disease in Father/Sister Risk Factors: tobacco use, T2DM, elevated CAC  Clinical ASCVD: No  The 10-year ASCVD risk score (Arnett DK, et al., 2019) is: 23.6%   Values used to calculate the score:     Age: 43 years     Sex: Male     Is Non-Hispanic African American: No     Diabetic: Yes     Tobacco smoker: Yes     Systolic Blood Pressure: 125 mmHg     Is BP treated: No     HDL Cholesterol: 25 mg/dL     Total Cholesterol: 169 mg/dL   Objective:  Lab Results  Component Value Date   HGBA1C 14.9 (H) 03/30/2023    Lab Results  Component Value Date   CREATININE 1.01 04/16/2023   BUN 13 04/16/2023   NA 135 04/16/2023   K CANCELED 04/16/2023   CL 96 04/16/2023   CO2 23 04/16/2023    Lab Results  Component Value Date   CHOL 169 12/29/2022   HDL 25 (L) 12/29/2022   LDLCALC 124 (H) 12/29/2022   TRIG 98 12/29/2022   CHOLHDL 6.8 12/29/2022    Medications Reviewed Today     Reviewed by Particia Lather, RPH (Pharmacist) on 05/18/23 at 0925  Med List Status: <None>   Medication Order Taking? Sig Documenting Provider Last Dose Status Informant  acetaminophen (TYLENOL) 500 MG tablet [id number] Yes Take 2 tablets (1,000 mg total) by mouth every 8 (eight) hours as needed for mild pain. Fritzi Mandes, MD Taking Active Self  Continuous Blood Gluc Receiver (FREESTYLE LIBRE READER) DEVI [id number]  1 Units by Does not apply route daily. UAD to test BGs daily. Dx E11.9 Raliegh Ip, DO  Active Self  Continuous Glucose Sensor (FREESTYLE LIBRE 3 PLUS SENSOR) MISC [id number]  Check BGs continuously. E11.9 Change sensor every 15 days. Delynn Flavin M, DO  Active   esomeprazole (NEXIUM) 20 MG capsule [id number] Yes Take 1 capsule (20 mg total) by mouth daily. Arnetha Courser, MD Taking Active   insulin aspart  (NOVOLOG FLEXPEN) 100 UNIT/ML FlexPen [id number] Yes Inject 5-10 units into the skin twice daily with breakfast and dinner. Raliegh Ip, DO Taking Active   Insulin Pen Needle (BD PEN NEEDLE NANO U/F) 32G X 4 MM MISC [id number]  UAD to administer insulin Delynn Flavin M, DO  Active Self  lipase/protease/amylase (CREON) 36000 UNITS CPEP capsule [id number] Yes Take 3 capsules with meals and 1-2  capsule with snacks ( up too 2 snacks )  Patient taking differently: Take 36,000-108,000 Units by mouth See admin instructions. Take 108,000 units by mouth with each meal eaten and 36,000-72,000 units with each snack   Mansouraty, Netty Starring., MD Taking Active Self  LORazepam (ATIVAN) 1 MG tablet [id number] No Take 1 tablet (1 mg total) by mouth as directed. The morning of procedure and 1 tablet at 1 hour before the procedure  Patient not taking: Reported on 04/27/2023   Mansouraty, Netty Starring., MD Not Taking Active   ondansetron [facility]) 4 MG tablet [id number] Yes Take 1 tablet (4 mg total) by mouth every 6 (six) hours as needed for nausea.  Arnetha Courser, MD Taking Active   Oxycodone HCl 10 MG TABS [id number] Yes Take 1 tablet (10 mg total) by mouth every 6 (six) hours as needed. Delynn Flavin M, DO Taking Active   rosuvastatin (CRESTOR) 10 MG tablet [id number] Yes Take 1 tablet (10 mg total) by mouth at bedtime. If you experience muscle aches take every other day Mallipeddi, Orion Modest, MD Taking Differently Active Self           Med Note Nelma Rothman   Tue May 18, 2023  8:31 AM) Taking every other day  SEMGLEE, YFGN, 100 UNIT/ML Pen [id number] Yes Inject 30-40 Units into the skin at bedtime. Raliegh Ip, DO Taking Active              Assessment/Plan:   Diabetes: - Currently uncontrolled with last A1c of 14.9% above goal <7% and worsened from previous A1c of 8.3%. Pt is insulin-dependent due to chronic pancreatitis, partial pancreatomy, should be treated like T1DM. Incretin mimetics  are C/I. FBG sounds slightly improved from previous visit with increasing basal insulin. Pt reports continued weight loss. PPG are still elevated, though lacking data without FL3 sensors. No evidence or s/sx of hypoglycemia. Appropriate to further titrate basal and prandial insulin. Discussed that intake of sugary beverages and potentially carbohydrate-heavy protein shakes may be contributing to hyperglycemia. Patient agreeable to decrease sugary beverages.  - Reviewed long term cardiovascular and renal outcomes of uncontrolled blood sugar - Reviewed goal A1c, goal fasting, and goal 2 hour post prandial glucose - Educated pt on signs and symptoms of hypoglycemia and rule of 15 . - Reviewed dietary modifications including reducing intake of carbohydrate-rich foods like bread, pasta, rice; increasing water intake; prioritizing protein and vegetables. Patient to check how many carbohydrates are in his protein (Ensure) drinks. Cut out sugary soda and tea.  - Recommend to INCREASE basal insulin glargine (Semglee) to 40 units subcutaneous once daily - Recommend to START prandial insulin aspart (Novolog) 8 units subcutaneous twice daily with largest meals of the day (breakfast and supper). Instructed patient to check BG 1-2 hours after snacks and protein drinks to assess whether he may need small doses of insulin with these as well.  - Recommend to check glucose continuously with FL3 + sensor and reader. Called [facility] and requested for them to get FL3+ sensors ready. Notified patient via telephone that he could pick these up this afternoon.  - Patient appropriately treated with maximum tolerated statin. Follow-up on myalgias/muscle pain at next appointment.  - Recommend obtaining UACR at PCP f/u. Not due for A1c recheck until 06/29/23. Can consider checking at pharmacist f/u on 12/17.   Follow Up Plan: Pharmacist in-person 06/22/23, PCP 07/28/23  Nils Pyle, PharmD PGY1 Pharmacy Resident  Kieth Brightly, PharmD, BCACP, CPP Clinical Pharmacist, [facility]

## 2023-05-18 ENCOUNTER — Ambulatory Visit: Payer: MEDICAID

## 2023-05-18 ENCOUNTER — Encounter: Payer: Self-pay | Admitting: Pharmacist

## 2023-05-18 ENCOUNTER — Ambulatory Visit (INDEPENDENT_AMBULATORY_CARE_PROVIDER_SITE_OTHER): Payer: MEDICAID | Admitting: Pharmacist

## 2023-05-18 DIAGNOSIS — Z23 Encounter for immunization: Secondary | ICD-10-CM

## 2023-05-18 DIAGNOSIS — E119 Type 2 diabetes mellitus without complications: Secondary | ICD-10-CM

## 2023-05-18 DIAGNOSIS — Z794 Long term (current) use of insulin: Secondary | ICD-10-CM

## 2023-05-18 NOTE — Progress Notes (Signed)
Men  A & B vaccine given to patient and tolerated well.

## 2023-05-18 NOTE — Patient Instructions (Addendum)
Increase your Semglee to 40 units once daily  Increase your meal time insulin aspart (Novolog) to 8 units with full meals. Check your blood sugar occasionally after eating (including snacks and Ensure drinks) to see if we need to add additional insulin with food.   Check the label of your Ensure drinks to see how many "total carbohydrates" are in the drink. Ideally, there would be less than 15-20g.   Try your best to trade out regular sodas for diet soda and sweeten your tea with alternative sweeteners like Stevia. This should help lower your blood sugar throughout the day.   We will call [facility] to check on your sensors.   If you have a low blood sugar less than 70 or feel dizzy, shaky, or sweaty- eat something sugary (like 1/2 cup of regular soda) and recheck your blood sugar after 15 minutes

## 2023-05-24 ENCOUNTER — Other Ambulatory Visit: Payer: Self-pay | Admitting: Gastroenterology

## 2023-05-24 ENCOUNTER — Ambulatory Visit (HOSPITAL_COMMUNITY)
Admission: RE | Admit: 2023-05-24 | Discharge: 2023-05-24 | Disposition: A | Discharge status: Home / Self Care | Payer: MEDICAID | Source: Ambulatory Visit | Attending: Gastroenterology | Admitting: Gastroenterology

## 2023-05-24 DIAGNOSIS — Z8719 Personal history of other diseases of the digestive system: Secondary | ICD-10-CM

## 2023-05-24 DIAGNOSIS — K861 Other chronic pancreatitis: Secondary | ICD-10-CM | POA: Insufficient documentation

## 2023-05-24 DIAGNOSIS — K8689 Other specified diseases of pancreas: Secondary | ICD-10-CM | POA: Insufficient documentation

## 2023-05-24 DIAGNOSIS — R634 Abnormal weight loss: Secondary | ICD-10-CM

## 2023-05-24 MED ORDER — GADOBUTROL 1 MMOL/ML IV SOLN
7.0000 mL | Freq: Once | INTRAVENOUS | Status: AC | PRN
  Administered 2023-05-24: 7 mL via INTRAVENOUS

## 2023-05-31 ENCOUNTER — Other Ambulatory Visit: Payer: Self-pay

## 2023-05-31 DIAGNOSIS — Z90410 Acquired total absence of pancreas: Secondary | ICD-10-CM

## 2023-05-31 DIAGNOSIS — R634 Abnormal weight loss: Secondary | ICD-10-CM

## 2023-05-31 DIAGNOSIS — K861 Other chronic pancreatitis: Secondary | ICD-10-CM

## 2023-05-31 DIAGNOSIS — Z8719 Personal history of other diseases of the digestive system: Secondary | ICD-10-CM

## 2023-05-31 DIAGNOSIS — K8681 Exocrine pancreatic insufficiency: Secondary | ICD-10-CM

## 2023-05-31 DIAGNOSIS — K8689 Other specified diseases of pancreas: Secondary | ICD-10-CM

## 2023-06-02 ENCOUNTER — Other Ambulatory Visit: Payer: MEDICAID

## 2023-06-02 DIAGNOSIS — K861 Other chronic pancreatitis: Secondary | ICD-10-CM

## 2023-06-02 DIAGNOSIS — Z90410 Acquired total absence of pancreas: Secondary | ICD-10-CM

## 2023-06-02 DIAGNOSIS — K8689 Other specified diseases of pancreas: Secondary | ICD-10-CM

## 2023-06-02 DIAGNOSIS — Z8719 Personal history of other diseases of the digestive system: Secondary | ICD-10-CM

## 2023-06-02 DIAGNOSIS — R634 Abnormal weight loss: Secondary | ICD-10-CM

## 2023-06-02 DIAGNOSIS — K8681 Exocrine pancreatic insufficiency: Secondary | ICD-10-CM

## 2023-06-02 NOTE — Addendum Note (Signed)
Addended by: Lattie Corns on: 06/02/2023 02:45 PM   Modules accepted: Orders

## 2023-06-02 NOTE — Addendum Note (Signed)
Addended by: Lattie Corns on: 06/02/2023 02:44 PM   Modules accepted: Orders

## 2023-06-03 ENCOUNTER — Telehealth: Payer: Self-pay | Admitting: Internal Medicine

## 2023-06-03 LAB — CBC WITH DIFFERENTIAL/PLATELET
Basophils Absolute: 0.1 10*3/uL (ref 0.0–0.2)
Basos: 1 %
EOS (ABSOLUTE): 0.1 10*3/uL (ref 0.0–0.4)
Eos: 1 %
Hematocrit: 47.1 % (ref 37.5–51.0)
Hemoglobin: 14.9 g/dL (ref 13.0–17.7)
Immature Grans (Abs): 0.1 10*3/uL (ref 0.0–0.1)
Immature Granulocytes: 1 %
Lymphocytes Absolute: 2.4 10*3/uL (ref 0.7–3.1)
Lymphs: 22 %
MCH: 29.9 pg (ref 26.6–33.0)
MCHC: 31.6 g/dL (ref 31.5–35.7)
MCV: 94 fL (ref 79–97)
Monocytes Absolute: 0.7 10*3/uL (ref 0.1–0.9)
Monocytes: 6 %
Neutrophils Absolute: 7.4 10*3/uL — ABNORMAL HIGH (ref 1.4–7.0)
Neutrophils: 69 %
Platelets: 574 10*3/uL — ABNORMAL HIGH (ref 150–450)
RBC: 4.99 x10E6/uL (ref 4.14–5.80)
RDW: 13.9 % (ref 11.6–15.4)
WBC: 10.6 10*3/uL (ref 3.4–10.8)

## 2023-06-03 LAB — COMPREHENSIVE METABOLIC PANEL
ALT: 68 [IU]/L — ABNORMAL HIGH (ref 0–44)
AST: 26 [IU]/L (ref 0–40)
Albumin: 4.2 g/dL (ref 3.8–4.9)
Alkaline Phosphatase: 545 [IU]/L — ABNORMAL HIGH (ref 44–121)
BUN/Creatinine Ratio: 15 (ref 9–20)
BUN: 14 mg/dL (ref 6–24)
Bilirubin Total: 0.6 mg/dL (ref 0.0–1.2)
CO2: 22 mmol/L (ref 20–29)
Calcium: 9.8 mg/dL (ref 8.7–10.2)
Chloride: 86 mmol/L — ABNORMAL LOW (ref 96–106)
Creatinine, Ser: 0.95 mg/dL (ref 0.76–1.27)
Globulin, Total: 2.5 g/dL (ref 1.5–4.5)
Glucose: 641 mg/dL (ref 70–99)
Potassium: 5.5 mmol/L — ABNORMAL HIGH (ref 3.5–5.2)
Sodium: 127 mmol/L — ABNORMAL LOW (ref 134–144)
Total Protein: 6.7 g/dL (ref 6.0–8.5)
eGFR: 96 mL/min/{1.73_m2} (ref >59)

## 2023-06-03 LAB — AMYLASE: Amylase: 58 U/L (ref 31–110)

## 2023-06-03 LAB — LIPASE: Lipase: 247 U/L — ABNORMAL HIGH (ref 13–78)

## 2023-06-03 NOTE — Telephone Encounter (Signed)
Called patient about glucose 600 and lipase elevation  He has already heard from Dr. Nanci Pina who gave advice re: treatment of glucose - he sounds asymptomatic  Abdominal pain is stable and unchanged  No nausea or vomiting and no fevers  Has f/u Dr. Meridee Score next week he says ands was given advice as to call back if significant change in pain, vomiting, fever

## 2023-06-07 ENCOUNTER — Telehealth: Payer: Self-pay | Admitting: Family Medicine

## 2023-06-07 ENCOUNTER — Other Ambulatory Visit: Payer: MEDICAID

## 2023-06-07 DIAGNOSIS — E1165 Type 2 diabetes mellitus with hyperglycemia: Secondary | ICD-10-CM

## 2023-06-07 DIAGNOSIS — E119 Type 2 diabetes mellitus without complications: Secondary | ICD-10-CM

## 2023-06-07 MED ORDER — SEMGLEE (YFGN) 100 UNIT/ML ~~LOC~~ SOPN: 40.0000 [IU] | PEN_INJECTOR | Freq: Every day | SUBCUTANEOUS | 3 refills | Status: DC

## 2023-06-07 NOTE — Telephone Encounter (Signed)
Thanks for update CG. No new updates so far, so hopefully will see at clinic tomorrow. GM

## 2023-06-07 NOTE — Telephone Encounter (Signed)
**  [facility] Medicine After Hours/ Emergency Line Call**  Patient: Steve Romero .  PCP: Raliegh Ip, DO  After hours nurse line calls to inform that patients labs resulted in BG >600.  Labs drawn by GI. Unclear if they have been contacted.  I contacted the patient at 7:30a and he checked BGs which were still >400.  Instructed to increase basal by 5 units nightly and take an additional 5u this morning and call me with BGs on Monday to make further adjustments. Continue mealtime as directed for now.  Will also cc Pharmacy to see if we might get him in for a CGM review and titrate insulin further.  Meds ordered this encounter  Medications   SEMGLEE, YFGN, 100 UNIT/ML Pen    Sig: Inject 40-50 Units into the skin at bedtime.    Dispense:  45 mL    Refill:  3     Alonza Knisley M. Nadine Counts, DO

## 2023-06-08 ENCOUNTER — Other Ambulatory Visit (INDEPENDENT_AMBULATORY_CARE_PROVIDER_SITE_OTHER): Payer: MEDICAID

## 2023-06-08 ENCOUNTER — Encounter: Payer: Self-pay | Admitting: Gastroenterology

## 2023-06-08 ENCOUNTER — Ambulatory Visit (INDEPENDENT_AMBULATORY_CARE_PROVIDER_SITE_OTHER): Payer: MEDICAID | Admitting: Gastroenterology

## 2023-06-08 VITALS — BP 122/78 | HR 71 | Ht 70.0 in | Wt 168.0 lb

## 2023-06-08 DIAGNOSIS — K861 Other chronic pancreatitis: Secondary | ICD-10-CM

## 2023-06-08 DIAGNOSIS — K831 Obstruction of bile duct: Secondary | ICD-10-CM

## 2023-06-08 DIAGNOSIS — R748 Abnormal levels of other serum enzymes: Secondary | ICD-10-CM

## 2023-06-08 DIAGNOSIS — K859 Acute pancreatitis without necrosis or infection, unspecified: Secondary | ICD-10-CM

## 2023-06-08 DIAGNOSIS — E1110 Type 2 diabetes mellitus with ketoacidosis without coma: Secondary | ICD-10-CM

## 2023-06-08 DIAGNOSIS — R634 Abnormal weight loss: Secondary | ICD-10-CM

## 2023-06-08 DIAGNOSIS — K863 Pseudocyst of pancreas: Secondary | ICD-10-CM

## 2023-06-08 DIAGNOSIS — Z8639 Personal history of other endocrine, nutritional and metabolic disease: Secondary | ICD-10-CM

## 2023-06-08 LAB — CBC
HCT: 45.6 % (ref 39.0–52.0)
Hemoglobin: 15 g/dL (ref 13.0–17.0)
MCHC: 33 g/dL (ref 30.0–36.0)
MCV: 91.9 fL (ref 78.0–100.0)
Platelets: 511 10*3/uL — ABNORMAL HIGH (ref 150.0–400.0)
RBC: 4.96 Mil/uL (ref 4.22–5.81)
RDW: 15.7 % — ABNORMAL HIGH (ref 11.5–15.5)
WBC: 11.3 10*3/uL — ABNORMAL HIGH (ref 4.0–10.5)

## 2023-06-08 LAB — COMPREHENSIVE METABOLIC PANEL
ALT: 128 U/L — ABNORMAL HIGH (ref 0–53)
AST: 41 U/L — ABNORMAL HIGH (ref 0–37)
Albumin: 4.1 g/dL (ref 3.5–5.2)
Alkaline Phosphatase: 566 U/L — ABNORMAL HIGH (ref 39–117)
BUN: 12 mg/dL (ref 6–23)
CO2: 30 meq/L (ref 19–32)
Calcium: 9.2 mg/dL (ref 8.4–10.5)
Chloride: 97 meq/L (ref 96–112)
Creatinine, Ser: 0.81 mg/dL (ref 0.40–1.50)
GFR: 101.48 mL/min (ref >60.00)
Glucose, Bld: 405 mg/dL — ABNORMAL HIGH (ref 70–99)
Potassium: 3.9 meq/L (ref 3.5–5.1)
Sodium: 134 meq/L — ABNORMAL LOW (ref 135–145)
Total Bilirubin: 0.6 mg/dL (ref 0.2–1.2)
Total Protein: 7.1 g/dL (ref 6.0–8.3)

## 2023-06-08 LAB — AMYLASE: Amylase: 41 U/L (ref 27–131)

## 2023-06-08 LAB — SEDIMENTATION RATE: Sed Rate: 16 mm/h (ref 0–20)

## 2023-06-08 LAB — C-REACTIVE PROTEIN: CRP: <1 mg/dL (ref 0.5–20.0)

## 2023-06-08 LAB — TSH: TSH: 1.1 u[IU]/mL (ref 0.35–5.50)

## 2023-06-08 LAB — LIPASE: Lipase: 144 U/L — ABNORMAL HIGH (ref 11.0–59.0)

## 2023-06-08 MED ORDER — OXYCODONE HCL 10 MG PO TABS: 10.0000 mg | ORAL_TABLET | Freq: Four times a day (QID) | ORAL | 0 refills | Status: DC | PRN

## 2023-06-08 NOTE — Progress Notes (Unsigned)
GASTROENTEROLOGY OUTPATIENT CLINIC VISIT   Primary Care Provider Delynn Flavin [place], DO [address] [place] [address] [phone]  Patient Profile: Steve Romero is a 52 y.o. 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  The patient presents to the [facility] for an evaluation and management of problem(s) noted below:  Problem List 1. Unintentional weight loss   2. Acute on chronic pancreatitis (HCC)   3. Elevated alkaline phosphatase level   4. Biliary stricture   5. Pancreatic pseudocyst   6. H/O insulin dependent diabetes mellitus    Discussed the use of AI scribe software for clinical note transcription with the patient, who gave verbal consent to proceed.  History of Present Illness Please see prior notes for full details of HPI.  Interval History The patient presents for follow-up and is accompanied by his sister.  He had a short hospitalization with recurrent concern for pancreatitis.  During that admission he was found to have significant DM with a HgbA1c >13.  He had laboratories performed before Thanksgiving in preparation for today's clinic visit and they returned with significant hyperglycemia/hyponatremia and elevated amylase/lipase suggestive of pancreatitis and elevated AP concerning for progressive recurrent biliary narrowing/stenosis.  His PCP and my GI coverage reached out to patient.  He was adjusted on his insulin and was able to eat well during this Thanksgiving period.  He still has episodes of abdominal pain for which he has used Oxycodone with moderate success  at pain relief.  Biggest issue at this point, is he is having even further unintentional weight loss.  He is down almost 30 pounds in 61-months.  He is eating.  He is not vomiting.  He has nausea rarely.  He continues to take Creon and is not having diarrhea but at times constipation and formed stools.  He is having BG levels consistently in the upper 300 to low 400s even on his increased Insulin regimen.  He is having polydipsia and polyuria.  He is having progressive fatigue with even minimal exertion.  No overt jaundice or pruritus symptoms.   They are concerned that he is worsening every day and not sure if he will need to come back to the hospital sooner rather than later.  He has a follow-up with pharmacy for his PCP office later this week for insulin strategies.  He does not have an endocrinologist.  We also discussed his recent imaging findings/results.   GI Review of Systems Positive as above Negative for dysphagia, odynophagia, hematochezia, melena  Review of Systems General: Denies fevers/chills Cardiovascular: Denies chest pain Pulmonary: Denies shortness of breath Gastroenterological: See HPI Genitourinary: As per HPI Hematological: Denies easy bruising/bleeding Dermatological: Denies jaundice Psychological: Mood is stable   Medications Current Outpatient Medications  Medication Sig Dispense Refill   acetaminophen (TYLENOL) 500 MG tablet Take 2 tablets (1,000 mg total) by mouth every 8 (eight) hours as needed for mild pain. 30 tablet 0   Continuous Blood Gluc Receiver (FREESTYLE LIBRE READER) DEVI 1 Units by Does not apply route daily. UAD to test BGs daily. Dx E11.9 1 each 1   Continuous Glucose Sensor (FREESTYLE LIBRE 3 PLUS SENSOR) MISC Check BGs continuously. E11.9 Change sensor every 15 days. 6 each 3   esomeprazole (NEXIUM)  20 MG capsule Take 1 capsule (20 mg total) by mouth daily. 30 capsule 1   insulin aspart (NOVOLOG FLEXPEN) 100 UNIT/ML FlexPen Inject 5-10 units into the  skin twice daily with breakfast and dinner. 15 mL 11   Insulin Pen Needle (BD PEN NEEDLE NANO U/F) 32G X 4 MM MISC UAD to administer insulin 100 each 3   lipase/protease/amylase (CREON) 36000 UNITS CPEP capsule Take 3 capsules with meals and 1-2  capsule with snacks ( up too 2 snacks ) (Patient taking differently: Take 36,000-108,000 Units by mouth See admin instructions. Take 108,000 units by mouth with each meal eaten and 36,000-72,000 units with each snack) 570 capsule 2   ondansetron (ZOFRAN) 4 MG tablet Take 1 tablet (4 mg total) by mouth every 6 (six) hours as needed for nausea. 20 tablet 0   rosuvastatin (CRESTOR) 10 MG tablet Take 1 tablet (10 mg total) by mouth at bedtime. If you experience muscle aches take every other day 90 tablet 1   SEMGLEE, YFGN, 100 UNIT/ML Pen Inject 40-50 Units into the skin at bedtime. 45 mL 3   Oxycodone HCl 10 MG TABS Take 1 tablet (10 mg total) by mouth every 6 (six) hours as needed. 30 tablet 0   No current facility-administered medications for this visit.    Allergies Allergies  Allergen Reactions   Desipramine Nausea Only and Rash    Histories Past Medical History:  Diagnosis Date   Acute pancreatitis without necrosis or infection, unspecified    AKI (acute kidney injury) (HCC) 05/17/2022   Anxiety 10/2014   Ascites    Bipolar disorder (HCC) age 53   COPD (chronic obstructive pulmonary disease) (HCC) 2016   Depression age 87   Enlarged prostate    Hyperlipidemia 2016   Hypertension 2013   Nausea vomiting and diarrhea 06/15/2022   Schizoaffective disorder (HCC) 11/13/2014   Sleep apnea    Does not wear c-pap, sleeps in sitting up position per pt   Thyroid disease 11/2014   Past Surgical History:  Procedure Laterality Date   BALLOON DILATION N/A 11/07/2020   Procedure: BALLOON DILATION;  Surgeon: Lemar Lofty., MD;  Location: [facility] ENDOSCOPY;  Service: Gastroenterology;  Laterality: N/A;   BALLOON DILATION N/A 06/26/2021    Procedure: BALLOON DILATION;  Surgeon: Meridee Score Netty Starring., MD;  Location: [facility] ENDOSCOPY;  Service: Gastroenterology;  Laterality: N/A;   BILIARY DILATION  07/24/2021   Procedure: BILIARY DILATION;  Surgeon: Meridee Score Netty Starring., MD;  Location: [facility] ENDOSCOPY;  Service: Gastroenterology;;   BILIARY DILATION  05/22/2022   Procedure: BILIARY DILATION;  Surgeon: Lemar Lofty., MD;  Location: [facility] ENDOSCOPY;  Service: Gastroenterology;;   BILIARY DILATION  06/16/2022   Procedure: GASTROSTOMY DILATION;  Surgeon: Lemar Lofty., MD;  Location: [facility] ENDOSCOPY;  Service: Gastroenterology;;   BILIARY STENT PLACEMENT N/A 10/10/2020   Procedure: BILIARY STENT PLACEMENT;  Surgeon: Malissa Hippo, MD;  Location: [facility];  Service: Endoscopy;  Laterality: N/A;   BILIARY STENT PLACEMENT  11/09/2020   Procedure: BILIARY STENT PLACEMENT;  Surgeon: Meridee Score Netty Starring., MD;  Location: [facility] ENDOSCOPY;  Service: Gastroenterology;;   BILIARY STENT PLACEMENT  11/07/2020   Procedure: BILIARY STENT PLACEMENT;  Surgeon: Lemar Lofty., MD;  Location: [facility] ENDOSCOPY;  Service: Gastroenterology;;   BILIARY STENT PLACEMENT  11/11/2020   Procedure: BILIARY STENT PLACEMENT;  Surgeon: Lemar Lofty., MD;  Location: [facility] ENDOSCOPY;  Service: Gastroenterology;;   BILIARY STENT PLACEMENT  11/14/2020   Procedure: BILIARY STENT PLACEMENT;  Surgeon: Lemar Lofty., MD;  Location: [facility] ENDOSCOPY;  Service: Gastroenterology;;   BILIARY STENT PLACEMENT N/A 01/29/2021   Procedure: BILIARY STENT PLACEMENT;  Surgeon: Lemar Lofty., MD;  Location: [facility] ENDOSCOPY;  Service: Gastroenterology;  Laterality: N/A;   BILIARY STENT PLACEMENT  06/26/2021   Procedure: BILIARY STENT PLACEMENT;  Surgeon: Meridee Score Netty Starring., MD;  Location: [facility] ENDOSCOPY;  Service: Gastroenterology;;   BILIARY STENT PLACEMENT  07/24/2021   Procedure: BILIARY STENT PLACEMENT;  Surgeon: Lemar Lofty., MD;   Location: [facility] ENDOSCOPY;  Service: Gastroenterology;;   BIOPSY  11/07/2020   Procedure: BIOPSY;  Surgeon: Lemar Lofty., MD;  Location: [facility] ENDOSCOPY;  Service: Gastroenterology;;   BIOPSY  12/20/2020   Procedure: BIOPSY;  Surgeon: Lemar Lofty., MD;  Location: [facility] ENDOSCOPY;  Service: Gastroenterology;;   BIOPSY  06/26/2021   Procedure: BIOPSY;  Surgeon: Lemar Lofty., MD;  Location: [facility] ENDOSCOPY;  Service: Gastroenterology;;   CARPAL TUNNEL RELEASE Left 02/21/2016   Procedure: CARPAL TUNNEL RELEASE;  Surgeon: Vickki Hearing, MD;  Location: [facility];  Service: Orthopedics;  Laterality: Left;   CHOLECYSTECTOMY N/A 10/09/2020   Procedure: LAPAROSCOPIC CHOLECYSTECTOMY;  Surgeon: Lucretia Roers, MD;  Location: [facility];  Service: General;  Laterality: N/A;   CYST ENTEROSTOMY N/A 11/07/2020   Procedure: CYST ENTEROSTOMY;  Surgeon: Lemar Lofty., MD;  Location: [facility] ENDOSCOPY;  Service: Gastroenterology;  Laterality: N/A;   CYST GASTROSTOMY  11/11/2020   Procedure: CYST NECROSECTOMY;  Surgeon: Meridee Score Netty Starring., MD;  Location: [facility] ENDOSCOPY;  Service: Gastroenterology;;   CYST GASTROSTOMY  11/14/2020   Procedure: CYST NECROSECTOMY;  Surgeon: Lemar Lofty., MD;  Location: [facility] ENDOSCOPY;  Service: Gastroenterology;;   CYST GASTROSTOMY  06/16/2022   Procedure: CYST GASTROSTOMY;  Surgeon: Lemar Lofty., MD;  Location: [facility] ENDOSCOPY;  Service: Gastroenterology;;   CYST REMOVAL HAND     CYSTOSCOPY  06/26/2021   Procedure: CYSTOGASTROSTOMY;  Surgeon: Mansouraty, Netty Starring., MD;  Location: [facility] ENDOSCOPY;  Service: Gastroenterology;;   ENDOSCOPIC RETROGRADE CHOLANGIOPANCREATOGRAPHY (ERCP) WITH PROPOFOL N/A 11/09/2020   Procedure: ENDOSCOPIC RETROGRADE CHOLANGIOPANCREATOGRAPHY (ERCP) WITH PROPOFOL;  Surgeon: Lemar Lofty., MD;  Location: [facility] ENDOSCOPY;  Service: Gastroenterology;  Laterality: N/A;   ENDOSCOPIC RETROGRADE  CHOLANGIOPANCREATOGRAPHY (ERCP) WITH PROPOFOL N/A 12/20/2020   Procedure: ENDOSCOPIC RETROGRADE CHOLANGIOPANCREATOGRAPHY (ERCP) WITH PROPOFOL;  Surgeon: Meridee Score Netty Starring., MD;  Location: [facility] ENDOSCOPY;  Service: Gastroenterology;  Laterality: N/A;   ENDOSCOPIC RETROGRADE CHOLANGIOPANCREATOGRAPHY (ERCP) WITH PROPOFOL N/A 01/29/2021   Procedure: ENDOSCOPIC RETROGRADE CHOLANGIOPANCREATOGRAPHY (ERCP) WITH PROPOFOL;  Surgeon: Meridee Score Netty Starring., MD;  Location: [facility] ENDOSCOPY;  Service: Gastroenterology;  Laterality: N/A;   ENDOSCOPIC RETROGRADE CHOLANGIOPANCREATOGRAPHY (ERCP) WITH PROPOFOL N/A 07/24/2021   Procedure: ENDOSCOPIC RETROGRADE CHOLANGIOPANCREATOGRAPHY (ERCP) WITH PROPOFOL;  Surgeon: Meridee Score Netty Starring., MD;  Location: [facility] ENDOSCOPY;  Service: Gastroenterology;  Laterality: N/A;   ERCP N/A 10/10/2020   Procedure: ENDOSCOPIC RETROGRADE CHOLANGIOPANCREATOGRAPHY (ERCP);  Surgeon: Malissa Hippo, MD;  Location: [facility];  Service: Endoscopy;  Laterality: N/A;   ERCP     ERCP N/A 05/22/2022   Procedure: ENDOSCOPIC RETROGRADE CHOLANGIOPANCREATOGRAPHY (ERCP);  Surgeon: Lemar Lofty., MD;  Location: [facility] ENDOSCOPY;  Service: Gastroenterology;  Laterality: N/A;   ESOPHAGOGASTRODUODENOSCOPY N/A 11/11/2020   Procedure: ESOPHAGOGASTRODUODENOSCOPY (EGD);  Surgeon: Lemar Lofty., MD;  Location[facility] ENDOSCOPY;  Service: Gastroenterology;  Laterality: N/A;   ESOPHAGOGASTRODUODENOSCOPY N/A 11/14/2020   Procedure: ESOPHAGOGASTRODUODENOSCOPY (EGD);  Surgeon: Lemar Lofty., MD;  Location: [facility] ENDOSCOPY;  Service: Gastroenterology;  Laterality: N/A;   ESOPHAGOGASTRODUODENOSCOPY (  EGD) WITH PROPOFOL N/A 11/09/2020   Procedure: ESOPHAGOGASTRODUODENOSCOPY (EGD) WITH PROPOFOL;  Surgeon: Meridee Score Netty Starring., MD;  Location: [facility] ENDOSCOPY;  Service: Gastroenterology;  Laterality: N/A;   ESOPHAGOGASTRODUODENOSCOPY (EGD) WITH PROPOFOL N/A 11/07/2020   Procedure:  ESOPHAGOGASTRODUODENOSCOPY (EGD) WITH PROPOFOL;  Surgeon: Meridee Score Netty Starring., MD;  Location: [facility] ENDOSCOPY;  Service: Gastroenterology;  Laterality: N/A;   ESOPHAGOGASTRODUODENOSCOPY (EGD) WITH PROPOFOL N/A 12/20/2020   Procedure: ESOPHAGOGASTRODUODENOSCOPY (EGD) WITH PROPOFOL;  Surgeon: Meridee Score Netty Starring., MD;  Location: [facility] ENDOSCOPY;  Service: Gastroenterology;  Laterality: N/A;   ESOPHAGOGASTRODUODENOSCOPY (EGD) WITH PROPOFOL N/A 06/26/2021   Procedure: ESOPHAGOGASTRODUODENOSCOPY (EGD) WITH PROPOFOL;  Surgeon: Meridee Score Netty Starring., MD;  Location: [facility] ENDOSCOPY;  Service: Gastroenterology;  Laterality: N/A;   ESOPHAGOGASTRODUODENOSCOPY (EGD) WITH PROPOFOL N/A 07/24/2021   Procedure: ESOPHAGOGASTRODUODENOSCOPY (EGD) WITH PROPOFOL;  Surgeon: Meridee Score Netty Starring., MD;  Location: [facility] ENDOSCOPY;  Service: Gastroenterology;  Laterality: N/A;  AXIOS STENT   ESOPHAGOGASTRODUODENOSCOPY (EGD) WITH PROPOFOL N/A 05/22/2022   Procedure: ESOPHAGOGASTRODUODENOSCOPY (EGD) WITH PROPOFOL;  Surgeon: Meridee Score Netty Starring., MD;  Location: [facility] ENDOSCOPY;  Service: Gastroenterology;  Laterality: N/A;   ESOPHAGOGASTRODUODENOSCOPY (EGD) WITH PROPOFOL N/A 06/16/2022   Procedure: ESOPHAGOGASTRODUODENOSCOPY (EGD) WITH PROPOFOL;  Surgeon: Meridee Score Netty Starring., MD;  Location: [facility] ENDOSCOPY;  Service: Gastroenterology;  Laterality: N/A;   ESOPHAGOGASTRODUODENOSCOPY (EGD) WITH PROPOFOL N/A 07/30/2022   Procedure: ESOPHAGOGASTRODUODENOSCOPY (EGD) WITH PROPOFOL;  Surgeon: Meridee Score Netty Starring., MD;  Location: [facility] ENDOSCOPY;  Service: Gastroenterology;  Laterality: N/A;   ESOPHAGOGASTRODUODENOSCOPY (EGD) WITH PROPOFOL N/A 03/30/2023   Procedure: ESOPHAGOGASTRODUODENOSCOPY (EGD) WITH PROPOFOL;  Surgeon: Iva Boop, MD;  Location: [facility] ENDOSCOPY;  Service: Gastroenterology;  Laterality: N/A;   EUS  11/14/2020   Procedure: UPPER ENDOSCOPIC ULTRASOUND (EUS) LINEAR;  Surgeon: Lemar Lofty., MD;  Location: [facility]  ENDOSCOPY;  Service: Gastroenterology;;   EUS N/A 06/26/2021   Procedure: UPPER ENDOSCOPIC ULTRASOUND (EUS) RADIAL;  Surgeon: Lemar Lofty., MD;  Location: [facility] ENDOSCOPY;  Service: Gastroenterology;  Laterality: N/A;   EUS N/A 05/22/2022   Procedure: UPPER ENDOSCOPIC ULTRASOUND (EUS) LINEAR;  Surgeon: Lemar Lofty., MD;  Location: [facility] ENDOSCOPY;  Service: Gastroenterology;  Laterality: N/A;   EUS N/A 06/16/2022   Procedure: UPPER ENDOSCOPIC ULTRASOUND (EUS) LINEAR;  Surgeon: Lemar Lofty., MD;  Location: [facility] ENDOSCOPY;  Service: Gastroenterology;  Laterality: N/A;   FINE NEEDLE ASPIRATION  05/22/2022   Procedure: FINE NEEDLE ASPIRATION;  Surgeon: Meridee Score Netty Starring., MD;  Location: [facility] ENDOSCOPY;  Service: Gastroenterology;;   GASTROINTESTINAL STENT REMOVAL  12/20/2020   Procedure: GASTROINTESTINAL STENT REMOVAL;  Surgeon: Lemar Lofty., MD;  Location: [facility] ENDOSCOPY;  Service: Gastroenterology;;  cyst gastrostomy stent and double pig tail stents x2 removed   HERNIA REPAIR Left 2002   groin   INGUINAL HERNIA REPAIR Left 04/05/2017   Procedure: RECURRENT HERNIA REPAIR INGUINAL ADULT WITH MESH;  Surgeon: Franky Macho, MD;  Location: [facility];  Service: General;  Laterality: Left;   MASS EXCISION Right 04/29/2020   Procedure: EXCISION MASS RIGHT WRIST;  Surgeon: Betha Loa, MD;  Location: [facility];  Service: Orthopedics;  Laterality: Right;   MASS EXCISION Right 10/09/2020   Procedure: EXCISION MASS, ABDOMINAL WALL, 2CM;  Surgeon: Lucretia Roers, MD;  Location: [facility];  Service: General;  Laterality: Right;   PANCREATIC STENT PLACEMENT  06/26/2021   Procedure: AXIOS STENT PLACEMENT;  Surgeon: Lemar Lofty., MD;  Location: [facility] ENDOSCOPY;  Service: Gastroenterology;;   PANCREATIC STENT PLACEMENT  06/16/2022   Procedure: PANCREATIC STENT PLACEMENT;  Surgeon: Meridee Score,  Netty Starring., MD;  Location: [facility] ENDOSCOPY;  Service:  Gastroenterology;;   REMOVAL OF STONES  12/20/2020   Procedure: REMOVAL OF STONES;  Surgeon: Lemar Lofty., MD;  Location: [facility] ENDOSCOPY;  Service: Gastroenterology;;   REMOVAL OF STONES  07/24/2021   Procedure: REMOVAL OF STONES;  Surgeon: Lemar Lofty., MD;  Location: [facility] ENDOSCOPY;  Service: Gastroenterology;;   REMOVAL OF STONES  05/22/2022   Procedure: REMOVAL OF STONES;  Surgeon: Lemar Lofty., MD;  Location: [facility] ENDOSCOPY;  Service: Gastroenterology;;   Dennison Mascot N/A 10/10/2020   Procedure: Dennison Mascot;  Surgeon: Malissa Hippo, MD;  Location: [facility];  Service: Endoscopy;  Laterality: N/A;   SPHINCTEROTOMY  07/24/2021   Procedure: SPHINCTEROTOMY;  Surgeon: Mansouraty, Netty Starring., MD;  Location: [facility] ENDOSCOPY;  Service: Gastroenterology;;   SPLENECTOMY, TOTAL N/A 11/17/2022   Procedure: SPLENECTOMY;  Surgeon: Fritzi Mandes, MD;  Location: [facility] OR;  Service: General;  Laterality: N/A;   SPYGLASS CHOLANGIOSCOPY N/A 12/20/2020   Procedure: [id number] CHOLANGIOSCOPY;  Surgeon: Lemar Lofty., MD;  Location: [facility] ENDOSCOPY;  Service: Gastroenterology;  Laterality: N/A;   SPYGLASS CHOLANGIOSCOPY N/A 07/24/2021   Procedure: SPYGLASS CHOLANGIOSCOPY;  Surgeon: Lemar Lofty., MD;  Location: [facility] ENDOSCOPY;  Service: Gastroenterology;  Laterality: N/A;   STENT REMOVAL  11/09/2020   Procedure: STENT REMOVAL;  Surgeon: Lemar Lofty., MD;  Location: [facility] ENDOSCOPY;  Service: Gastroenterology;;   Francine Graven REMOVAL  11/11/2020   Procedure: STENT REMOVAL;  Surgeon: Lemar Lofty., MD;  Location: [facility] ENDOSCOPY;  Service: Gastroenterology;;   Francine Graven REMOVAL  11/14/2020   Procedure: STENT REMOVAL;  Surgeon: Lemar Lofty., MD;  Location: [facility] ENDOSCOPY;  Service: Gastroenterology;;   Francine Graven REMOVAL  12/20/2020   Procedure: STENT REMOVAL;  Surgeon: Lemar Lofty., MD;  Location: [facility] ENDOSCOPY;  Service: Gastroenterology;;  biliary x2    STENT REMOVAL  07/24/2021   Procedure: AXIOS STENT REMOVAL;  Surgeon: Lemar Lofty., MD;  Location: [facility] ENDOSCOPY;  Service: Gastroenterology;;   Francine Graven REMOVAL  07/30/2022   Procedure: STENT REMOVAL;  Surgeon: Lemar Lofty., MD;  Location: [facility] ENDOSCOPY;  Service: Gastroenterology;;   UPPER ESOPHAGEAL ENDOSCOPIC ULTRASOUND (EUS) N/A 11/07/2020   Procedure: UPPER ESOPHAGEAL ENDOSCOPIC ULTRASOUND (EUS);  Surgeon: Lemar Lofty., MD;  Location: [facility] ENDOSCOPY;  Service: Gastroenterology;  Laterality: N/A;   UPPER GASTROINTESTINAL ENDOSCOPY     WOUND DEBRIDEMENT  11/09/2020   Procedure: CYST NECROSECTOMY;  Surgeon: Mansouraty, Netty Starring., MD;  Location: [facility] ENDOSCOPY;  Service: Gastroenterology;;   Social History   Socioeconomic History   Marital status: Single    Spouse name: Not on file   Number of children: 0   Years of education: Not on file   Highest education level: Not on file  Occupational History   Not on file  Tobacco Use   Smoking status: Every Day    Current packs/day: 0.25    Average packs/day: 0.3 packs/day for 26.0 years (6.5 ttl pk-yrs)    Types: Cigarettes   Smokeless tobacco: Never   Tobacco comments:    4-5 cig daily as of 10/07/2020.  Vaping Use   Vaping status: Never Used  Substance and Sexual Activity   Alcohol use: No   Drug use: No   Sexual activity: Never    Birth control/protection: None  Other Topics Concern   Not on file  Social History Narrative   Not on file   Social Determinants of Health   Financial Resource Strain: Not on file  Food  Insecurity: No Food Insecurity (04/02/2023)   Hunger Vital Sign    Worried About Running Out of Food in the Last Year: Never true    Ran Out of Food in the Last Year: Never true  Transportation Needs: No Transportation Needs (04/02/2023)   PRAPARE - Administrator, Civil Service (Medical): No    Lack of Transportation (Non-Medical): No  Physical Activity: Not on file  Stress:  Not on file  Social Connections: Not on file  Intimate Partner Violence: Not At Risk (03/28/2023)   Humiliation, Afraid, Rape, and Kick questionnaire    Fear of Current or Ex-Partner: No    Emotionally Abused: No    Physically Abused: No    Sexually Abused: No   Family History  Problem Relation Age of Onset   Diabetes Father    Heart disease Father    Heart disease Maternal Grandmother    Stroke Maternal Grandfather    Colon cancer Neg Hx    Esophageal cancer Neg Hx    Inflammatory bowel disease Neg Hx    Liver disease Neg Hx    Pancreatic cancer Neg Hx    Rectal cancer Neg Hx    Stomach cancer Neg Hx    I have reviewed his medical, social, and family history in detail and updated the electronic medical record as necessary.    PHYSICAL EXAMINATION  BP 122/78   Pulse 71   Ht 5\' 10"  (1.778 m)   Wt 168 lb (76.2 kg)   BMI 24.11 kg/m  Wt Readings from Last 3 Encounters:  06/08/23 168 lb (76.2 kg)  04/16/23 177 lb (80.3 kg)  03/28/23 177 lb (80.3 kg)  GEN: NAD, appears stated age, doesn't appear chronically ill, mother with him PSYCH: Cooperative, without pressured speech EYE: Sclera anicteric ENT: MMM CV: Nontachycardic RESP: No audible wheezing GI: NABS, soft, protuberant abdomen, TTP in upper abdomen, no rebound, no guarding MSK/EXT: No significant lower extremity edema SKIN: No jaundice NEURO:  Alert & Oriented x 3, no focal deficits   REVIEW OF DATA  I reviewed the following data at the time of this encounter:  GI Procedures and Studies  No new studies to review  Laboratory Studies  Reviewed those in epic  Imaging Studies  11/24 MRI/MRCP IMPRESSION: 1. Examination is generally limited by breath motion artifact, particularly multiphasic contrast enhanced sequences. 2. Within this limitation, unchanged appearance of the pancreas status post distal pancreatectomy. Remnant pancreatic tissue in the pancreatic head and a portion of the pancreatic tail is  unchanged. 3. Unchanged fluid signal cystic lesion adjacent to the pancreatic tail and greater curvature of the stomach measuring 3.5 x 2.9 cm and within the pancreatic tail remnant measuring 1.6 x 1.2 cm. Additional small unchanged fluid signal cystic lesion in the pancreatic head measuring 0.8 x 0.7 cm. These are consistent with pseudocysts in this clinical setting. 4. No acute inflammatory findings. 5. Status post cholecystectomy. Unchanged biliary ductal dilatation. The central common bile duct is again effaced within the pancreatic head most likely secondary to chronic sequelae of pancreatitis. No choledocholithiasis or obvious obstructing mass. Consider ERCP to assess for functional obstruction given report of abnormal LFTs. 6. Status post splenectomy.  9/24 CTAP IMPRESSION: 1. Evidence of patient's previous partial pancreatectomy and splenectomy. Residual pancreatic tissue over the pancreatic head and also over the expected distal body of the pancreas. Persistent fluid and mild fat stranding over the pancreatic surgical bed which may be slightly worse and may represent active pancreatitis.  New 3.3 cm oval cystic structure abutting the greater curvature of the stomach. Slight interval decrease in size of a 1.9 x 2.1 cm density abutting the gastric fundus. These findings may represent persistent pancreatic pseudocysts versus other infected versus noninfected postsurgical collections. 2. Mild mucosal enhancement and wall thickening involving the duodenum and very proximal jejunum which may be secondary to adjacent pancreatic inflammatory process. 3. Previous cholecystectomy with stable dilatation of the common bile duct and central intrahepatic ducts. 4. Mild fecal retention throughout the colon. 5. Suggestion of mild emphysematous disease.   ASSESSMENT  Mr. Sibole is a 52 y.o. 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  The patient is seen today for evaluation and management of:  1. Unintentional weight loss   2. Acute on chronic pancreatitis (HCC)   3. Elevated alkaline phosphatase level   4. Biliary stricture   5. Pancreatic pseudocyst   6. H/O insulin dependent diabetes mellitus     Assessment and Plan    Uncontrolled Diabetes Mellitus   His significant weight loss is likely due to uncontrolled diabetes, with an estimated A1c of 13-14 based on recent blood glucose levels. The current insulin regimen is insufficient, leading to hyperglycemia, polyuria, and potential malnutrition. We discussed the need for closer monitoring and a potential endocrinology referral. We will repeat labs including A1c, potassium, sodium, and thyroid function tests, refer him to endocrinology, increase the insulin dosage per the endocrinologist's recommendations, monitor blood glucose with Freestyle sensors, and ensure he maintains adequate hydration.  Pancreatic Insufficiency   He is on Creon for enzyme replacement and reports occasional vomiting and hard stools, which could be related to Creon or fluid loss from uncontrolled diabetes. We discussed a potential Creon dosage adjustment if symptoms persist. He will continue Creon with meals and snacks, consider reducing the Creon dosage if gastrointestinal symptoms persist, and ensure adequate fluid intake to prevent constipation.  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 We discussed potential stent placement if bilirubin levels rise. We  will monitor liver function tests, consider stent placement if bilirubin levels rise, and repeat imaging if clinically indicated.  Pain Management   He is using oxycodone 5 mg as needed for pain. We discussed the importance of stool softeners to manage opioid-induced constipation. We will prescribe oxycodone 5 mg as needed and advise on stool softeners for constipation management.  Follow-up   We will follow up with endocrinology, repeat labs and review results in a few days, and monitor for signs of worsening liver function or the need for biliary stent placement.       PLAN  Plan for CT abdomen pancreas protocol in 2 months Continue Creon - 3 capsules with each meal - 1 capsule with each snack Continue daily PPI Low-fat diet Tobacco cessation recommended again Tramadol 50 mg every 8 hours as needed -I am willing to keep this particular pain medication on board for patient if needed long-term will see how long he needs it Patient to follow-up with PCP in regards to elevated blood sugars as he may need to introduce mealtime insulin   Orders Placed This Encounter  Procedures   CBC   Comp  Met (CMET)   Amylase   Lipase   TSH   Sedimentation rate   C-reactive protein   Hemoglobin A1c   Ambulatory referral to Endocrinology    New Prescriptions   No medications on file   Modified Medications   Modified Medication Previous Medication   OXYCODONE HCL 10 MG TABS Oxycodone HCl 10 MG TABS      Take 1 tablet (10 mg total) by mouth every 6 (six) hours as needed.    Take 1 tablet (10 mg total) by mouth every 6 (six) hours as needed.    Planned Follow Up Return in about 4 months (around 10/07/2023).   Total Time in Face-to-Face and in Coordination of Care for patient including independent/personal interpretation/review of prior testing, medical history, examination, medication adjustment, communicating results with the patient directly, and documentation with the EHR is 30  minutes.   Corliss Parish, MD [facility] Office # [phone]

## 2023-06-08 NOTE — Patient Instructions (Addendum)
Your provider has requested that you go to the basement level for lab work before leaving today. Press "B" on the elevator. The lab is located at the first door on the left as you exit the elevator.  We have sent the following medications to your pharmacy for you to pick up at your convenience: Oxycodone   You have been referred to Endocrinology for Uncontrolled DM. Someone from their office will contact you to schedule an appointment. If you have not heard from their office within 1-2 weeks, please contact them at  [facility] Address: [address] [place], [place] [address] Phone: [phone]  _______________________________________________________  If your blood pressure at your visit was 140/90 or greater, please contact your primary care physician to follow up on this.  _______________________________________________________  If you are age 52 or older, your body mass index should be between 23-30. Your Body mass index is 24.11 kg/m. If this is out of the aforementioned range listed, please consider follow up with your Primary Care Provider.  If you are age 25 or younger, your body mass index should be between 19-25. Your Body mass index is 24.11 kg/m. If this is out of the aformentioned range listed, please consider follow up with your Primary Care Provider.   ________________________________________________________  The [facility] providers would like to encourage you to use [facility] to communicate with providers for non-urgent requests or questions.  Due to long hold times on the telephone, sending your provider a message by [facility] may be a faster and more efficient way to get a response.  Please allow 48 business hours for a response.  Please remember that this is for non-urgent requests.  _______________________________________________________  Due to recent changes in healthcare laws, you may see the results of your imaging and laboratory studies  on MyChart before your provider has had a chance to review them.  We understand that in some cases there may be results that are confusing or concerning to you. Not all laboratory results come back in the same time frame and the provider may be waiting for multiple results in order to interpret others.  Please give [place] 48 hours in order for your provider to thoroughly review all the results before contacting the office for clarification of your results.   Thank you for choosing me and [facility].  Dr. Meridee Score

## 2023-06-09 LAB — HEMOGLOBIN A1C: Hgb A1c MFr Bld: >14 %{Hb} — ABNORMAL HIGH (ref <5.7)

## 2023-06-09 NOTE — Progress Notes (Signed)
06/10/2023 Name: Steve Romero MRN: [id number] DOB: 1971/06/14  Chief Complaint  Patient presents with   Diabetes    Steve Romero is a 52 y.o. year old male who was referred for medication management by their primary care provider, Delynn Flavin M, DO. They presented for a face to face visit today.   They were referred to the pharmacist by their PCP for assistance in managing diabetes  PMH includes T2DM (diagnosed Mar 2024 2/2 recurrent pancreatitis, partial pancreatomy with acute necrotizing pancreatitis), chronic pancreatitis, HTN, COPD, elevated CAC, splenectomy.  Subjective: At last pharmacist visit - initiated prandial insulin Novolog (aspart) 8 units with meals, increased basal insulin to 40 units daily, communicated with pharmacy to fill [facility] sensors, and discussed lifestyle interventions to prevent hyperglycemia. He was seen for surgery f/u s/p pancreatomy on 05/06/23 - he reported eating regular meals and drinking 2-3 protein shakes per day. Noted to have recurrent peripancreatic fluid collection adjacent to stomach and a very small remnant of pancreas - which they will follow-up on MRI next month. Discussed potential need for feeding tube if weight loss continues. At surgey f/u on 11/27, BG resulted at 641, decided against subsequent resection at this time. At GI f/u on 12/2, BG resulted at 405, LFTs continue to be elevated. Pt was referred to endocrinology and worked in with PCP/pharmacy for continued titration of meal time insulin. Over the phone, Dr. Nadine Counts increased his basal insulin to 45 units daily.   Today pt reports that he was able to pick up FL3 sensors from the pharmacy, but he has had issues with them staying on for more than 3-4 days. He has not been getting protein shakes recently because they are too expensive. Pt continues to have abdominal pain.    Care Team: Primary Care Provider: Raliegh Ip, DO ; Next Scheduled Visit:  07/28/23 Gastroenterology: Corliss Parish; Next Scheduled Visit: 06/08/23  Medication Access/Adherence  Current Pharmacy:  [facility] [address], [place] - [address] [place] [address] Phone: [phone] Fax: [phone]   Patient reports affordability concerns with their medications: No  - he reports affordability concerns with his protein shakes Patient reports access/transportation concerns to their pharmacy: No  - has a ride to take him to appointments/pharmacy Patient reports adherence concerns with their medications:  No    Denies missed doses of medications. Fill hx good. Pt states that medications are affordable.   Diabetes:  Current medications: insulin glargine (Semglee) 40 units nightly (9-10 PM), insulin aspart (Novolog) 10 units with meals (~2 meals/day) Medications tried in the past: none  Current glucose readings:  This AM: 511 - took 5 units of insulin ~1 hour ago > now 397 yesterday: last night 11PM 216 (after eating), at 7PM 144 (before eating)  Sensor started giving low battery error, so was unable to review additional readings. Does not have FL2 with him today. Pt reports he usually sees numbers ~200-300.   Using Accu-Chek  meter; testing intermittently 3-4 times daily, since FL3 sensors have not been staying on. He is still willing to try the sensor again.   Patient denies hypoglycemic s/sx including dizziness, shakiness, sweating. Patient reports hyperglycemic symptoms including nocturia (every hour at night), neuropathy, polydipsia at night), blurred vision (saw eye doctor ~1-2 mo ago).   Pt endorses recent weight loss.   Current meal patterns: Usually eat 1-2 main meal (evening), otherwise snacks throughout the day - Breakfast: occasional biscuit, sausage, egg  -  Supper :Chicken or beef, vegetables - occasionally with a biscuit or bread. Last night had baked flounder (no breading) and greens.  - Snacks: pack of tuna,  pack of crackers, can of beans  - Drinks: drinking water mostly (3-4 20 oz bottles per day) - previously had half/half tea and regular mountain dew but has cut out sugary beverages.   Has not had his protein shakes in ~1 week. They are too expensive for him to buy regularly. Was previously having 2-3/day.   Has cut down on biscuit, potatoes, rice. Pt reports that he has been trying to implement the healthy plate method of filling 1/2 plate with green vegetables and other quarter with protein and small serving of carbohydrates.   Hyperlipidemia/ASCVD Risk Reduction  Current lipid lowering medications: rosuvastatin 10 mg PO every other day   Myalgias improved on every other day statin dosing  ASCVD History: 12/30/22 > CAC score 35 (77th percentile), mild calcified plaque in RCA (non-obstructive) Family History: Heart disease in Father/Sister Risk Factors: tobacco use, T2DM, elevated CAC  Clinical ASCVD: No  The 10-year ASCVD risk score (Arnett DK, et al., 2019) is: 22.7%   Values used to calculate the score:     Age: 72 years     Sex: Male     Is Non-Hispanic African American: No     Diabetic: Yes     Tobacco smoker: Yes     Systolic Blood Pressure: 122 mmHg     Is BP treated: No     HDL Cholesterol: 25 mg/dL     Total Cholesterol: 169 mg/dL   Objective:  Lab Results  Component Value Date   HGBA1C >14.0 (H) 06/08/2023    Lab Results  Component Value Date   CREATININE 0.81 06/08/2023   BUN 12 06/08/2023   NA 134 (L) 06/08/2023   K 3.9 06/08/2023   CL 97 06/08/2023   CO2 30 06/08/2023    Lab Results  Component Value Date   CHOL 169 12/29/2022   HDL 25 (L) 12/29/2022   LDLCALC 124 (H) 12/29/2022   TRIG 98 12/29/2022   CHOLHDL 6.8 12/29/2022    Medications Reviewed Today     Reviewed by Particia Lather, RPH (Pharmacist) on 06/10/23 at [phone]  Med List Status: <None>   Medication Order Taking? Sig Documenting Provider Last Dose Status Informant  acetaminophen (TYLENOL)  500 MG tablet [id number]  Take 2 tablets (1,000 mg total) by mouth every 8 (eight) hours as needed for mild pain. Fritzi Mandes, MD  Active Self  Continuous Blood Gluc Receiver (FREESTYLE LIBRE READER) DEVI [id number]  1 Units by Does not apply route daily. UAD to test BGs daily. Dx E11.9 Raliegh Ip, DO  Active Self  Continuous Glucose Sensor (FREESTYLE LIBRE 3 PLUS SENSOR) MISC [id number] Yes Check BGs continuously. E11.9 Change sensor every 15 days. Raliegh Ip, DO Taking Active            Med Note Particia Lather   Thu Jun 10, 2023  9:51 AM) Had issues with sensor falling off after 3-4 days  esomeprazole (NEXIUM) 20 MG capsule [id number]  Take 1 capsule (20 mg total) by mouth daily. Arnetha Courser, MD  Active   insulin aspart (NOVOLOG FLEXPEN) 100 UNIT/ML FlexPen [id number] Yes Inject 5-10 units into the skin twice daily with breakfast and dinner. Raliegh Ip, DO Taking Active            Med Note (Kandice Schmelter P   Thu  Jun 10, 2023  9:51 AM) Taking 10 units with meals  Insulin Pen Needle (BD PEN NEEDLE NANO U/F) 32G X 4 MM MISC [id number]  UAD to administer insulin Delynn Flavin M, DO  Active Self  lipase/protease/amylase (CREON) 36000 UNITS CPEP capsule [id number] Yes Take 3 capsules with meals and 1-2  capsule with snacks ( up too 2 snacks )  Patient taking differently: Take 36,000-108,000 Units by mouth See admin instructions. Take 108,000 units by mouth with each meal eaten and 36,000-72,000 units with each snack   Mansouraty, Netty Starring., MD Taking Active Self  ondansetron (ZOFRAN) 4 MG tablet [id number]  Take 1 tablet (4 mg total) by mouth every 6 (six) hours as needed for nausea. Arnetha Courser, MD  Active   Oxycodone HCl 10 MG TABS [id number]  Take 1 tablet (10 mg total) by mouth every 6 (six) hours as needed. Mansouraty, Netty Starring., MD  Active   rosuvastatin (CRESTOR) 10 MG tablet [id number] Yes Take 1 tablet (10 mg total) by mouth at bedtime. If you experience muscle aches  take every other day Mallipeddi, Orion Modest, MD Taking Active Self           Med Note Nelma Rothman   Tue May 18, 2023  8:31 AM) Taking every other day  SEMGLEE, YFGN, 100 UNIT/ML Pen [id number] Yes Inject 40-50 Units into the skin at bedtime. Raliegh Ip, DO Taking Active            Med Note Particia Lather   Thu Jun 10, 2023  9:51 AM) Taking 40 units daily             Assessment/Plan:   Diabetes due to pancreatitis and partial pancreatomy: - Currently uncontrolled with last A1c of 14.9% above goal <7% and worsened from previous A1c of 8.3%. Pt is insulin-dependent due to chronic pancreatitis, partial pancreatomy, treated like T1DM. Incretin mimetics are C/I. Pt continues to have severely elevated fasting and prandial sugars, though he had an occasional reading in the 140s which is an improvement. Further titration of basal and prandial insulin is appropriate. Patient has made multiple positive dietary changes.  - Reviewed long term cardiovascular and renal outcomes of uncontrolled blood sugar - Reviewed goal A1c, goal fasting, and goal 2 hour post prandial glucose - Educated pt on signs and symptoms of hypoglycemia and rule of 15 . - Reviewed dietary modifications including reducing intake of carbohydrate-rich foods like bread, pasta, rice; increasing water intake; prioritizing protein and vegetables. P - Recommend to INCREASE basal insulin glargine (Semglee) to 48 units subcutaneous once daily - Recommend to INCREASE prandial insulin aspart (Novolog) 15 units subcutaneous twice daily with largest meals of the day (breakfast and supper) and 5 units with snacks.  - Recommend to check glucose continuously with FL3 + sensor and reader or before meals and at bedtime with Accu Chek Guide meter.  - Patient appropriately treated with maximum tolerated statin - Recommend obtaining UACR at PCP f/u. Not due for A1c recheck until 06/29/23.   Follow Up Plan: Pharmacist in-person 06/22/23, PCP  07/28/23  Nils Pyle, PharmD PGY1 Pharmacy Resident  Kieth Brightly, PharmD, BCACP, CPP Clinical Pharmacist, [facility]

## 2023-06-10 ENCOUNTER — Ambulatory Visit (INDEPENDENT_AMBULATORY_CARE_PROVIDER_SITE_OTHER): Payer: MEDICAID | Admitting: Pharmacist

## 2023-06-10 DIAGNOSIS — E1165 Type 2 diabetes mellitus with hyperglycemia: Secondary | ICD-10-CM

## 2023-06-10 DIAGNOSIS — Z794 Long term (current) use of insulin: Secondary | ICD-10-CM

## 2023-06-10 DIAGNOSIS — E119 Type 2 diabetes mellitus without complications: Secondary | ICD-10-CM

## 2023-06-10 NOTE — Patient Instructions (Addendum)
Increase your meal time insulin to 15 units with meals and 5 units with snacks  Increase your once daily insulin to 48 units   Continue to monitor your fasting blood sugar in the morning, before meals, and at bedtime. If you have any blood sugars that are less than 70, let us know. Check your blood sugar if you ever feel dizzy, shaky, or sweaty.   Pick up your sensors from the pharmacy next time they are able to be filled and try again to wear them.

## 2023-06-11 ENCOUNTER — Encounter: Payer: Self-pay | Admitting: Gastroenterology

## 2023-06-11 DIAGNOSIS — E1110 Type 2 diabetes mellitus with ketoacidosis without coma: Secondary | ICD-10-CM | POA: Insufficient documentation

## 2023-06-11 MED ORDER — NOVOLOG FLEXPEN 100 UNIT/ML ~~LOC~~ SOPN: PEN_INJECTOR | SUBCUTANEOUS | 3 refills | Status: DC

## 2023-06-11 MED ORDER — SEMGLEE (YFGN) 100 UNIT/ML ~~LOC~~ SOPN
40.0000 [IU] | PEN_INJECTOR | Freq: Every day | SUBCUTANEOUS | Status: DC
Start: 2023-06-11 — End: 2023-07-08

## 2023-06-22 ENCOUNTER — Other Ambulatory Visit: Payer: MEDICAID

## 2023-06-24 ENCOUNTER — Ambulatory Visit: Payer: MEDICAID

## 2023-07-07 ENCOUNTER — Emergency Department (HOSPITAL_COMMUNITY): Payer: MEDICAID

## 2023-07-07 ENCOUNTER — Inpatient Hospital Stay (HOSPITAL_COMMUNITY)
Admission: EM | Admit: 2023-07-07 | Discharge: 2023-07-08 | DRG: 638 | Disposition: A | Discharge status: Home / Self Care | Payer: MEDICAID | Attending: Family Medicine | Admitting: Family Medicine

## 2023-07-07 ENCOUNTER — Encounter (HOSPITAL_COMMUNITY): Payer: Self-pay | Admitting: *Deleted

## 2023-07-07 ENCOUNTER — Other Ambulatory Visit: Payer: Self-pay

## 2023-07-07 DIAGNOSIS — K863 Pseudocyst of pancreas: Secondary | ICD-10-CM | POA: Diagnosis present

## 2023-07-07 DIAGNOSIS — I10 Essential (primary) hypertension: Secondary | ICD-10-CM | POA: Diagnosis present

## 2023-07-07 DIAGNOSIS — Z90411 Acquired partial absence of pancreas: Secondary | ICD-10-CM | POA: Diagnosis not present

## 2023-07-07 DIAGNOSIS — Z888 Allergy status to other drugs, medicaments and biological substances status: Secondary | ICD-10-CM

## 2023-07-07 DIAGNOSIS — M545 Low back pain, unspecified: Secondary | ICD-10-CM

## 2023-07-07 DIAGNOSIS — K861 Other chronic pancreatitis: Secondary | ICD-10-CM | POA: Diagnosis present

## 2023-07-07 DIAGNOSIS — E860 Dehydration: Secondary | ICD-10-CM | POA: Diagnosis present

## 2023-07-07 DIAGNOSIS — R739 Hyperglycemia, unspecified: Secondary | ICD-10-CM | POA: Diagnosis present

## 2023-07-07 DIAGNOSIS — E785 Hyperlipidemia, unspecified: Secondary | ICD-10-CM | POA: Diagnosis present

## 2023-07-07 DIAGNOSIS — E119 Type 2 diabetes mellitus without complications: Secondary | ICD-10-CM

## 2023-07-07 DIAGNOSIS — G8929 Other chronic pain: Secondary | ICD-10-CM | POA: Diagnosis present

## 2023-07-07 DIAGNOSIS — F17210 Nicotine dependence, cigarettes, uncomplicated: Secondary | ICD-10-CM | POA: Diagnosis present

## 2023-07-07 DIAGNOSIS — Z8249 Family history of ischemic heart disease and other diseases of the circulatory system: Secondary | ICD-10-CM

## 2023-07-07 DIAGNOSIS — E1165 Type 2 diabetes mellitus with hyperglycemia: Secondary | ICD-10-CM

## 2023-07-07 DIAGNOSIS — E870 Hyperosmolality and hypernatremia: Secondary | ICD-10-CM | POA: Diagnosis present

## 2023-07-07 DIAGNOSIS — Z79899 Other long term (current) drug therapy: Secondary | ICD-10-CM | POA: Diagnosis not present

## 2023-07-07 DIAGNOSIS — N401 Enlarged prostate with lower urinary tract symptoms: Secondary | ICD-10-CM | POA: Diagnosis present

## 2023-07-07 DIAGNOSIS — E0810 Diabetes mellitus due to underlying condition with ketoacidosis without coma: Secondary | ICD-10-CM | POA: Diagnosis not present

## 2023-07-07 DIAGNOSIS — E1110 Type 2 diabetes mellitus with ketoacidosis without coma: Secondary | ICD-10-CM | POA: Diagnosis present

## 2023-07-07 DIAGNOSIS — Z823 Family history of stroke: Secondary | ICD-10-CM | POA: Diagnosis not present

## 2023-07-07 DIAGNOSIS — Z833 Family history of diabetes mellitus: Secondary | ICD-10-CM

## 2023-07-07 DIAGNOSIS — J449 Chronic obstructive pulmonary disease, unspecified: Secondary | ICD-10-CM | POA: Diagnosis present

## 2023-07-07 DIAGNOSIS — Z794 Long term (current) use of insulin: Secondary | ICD-10-CM | POA: Diagnosis not present

## 2023-07-07 DIAGNOSIS — R531 Weakness: Principal | ICD-10-CM

## 2023-07-07 DIAGNOSIS — E1169 Type 2 diabetes mellitus with other specified complication: Secondary | ICD-10-CM | POA: Diagnosis present

## 2023-07-07 LAB — CBC WITH DIFFERENTIAL/PLATELET
Abs Immature Granulocytes: 0.17 10*3/uL — ABNORMAL HIGH (ref 0.00–0.07)
Basophils Absolute: 0.1 10*3/uL (ref 0.0–0.1)
Basophils Relative: 1 %
Eosinophils Absolute: 0 10*3/uL (ref 0.0–0.5)
Eosinophils Relative: 0 %
HCT: 46.6 % (ref 39.0–52.0)
Hemoglobin: 15.7 g/dL (ref 13.0–17.0)
Immature Granulocytes: 1 %
Lymphocytes Relative: 8 %
Lymphs Abs: 1.3 10*3/uL (ref 0.7–4.0)
MCH: 31.1 pg (ref 26.0–34.0)
MCHC: 33.7 g/dL (ref 30.0–36.0)
MCV: 92.3 fL (ref 80.0–100.0)
Monocytes Absolute: 0.7 10*3/uL (ref 0.1–1.0)
Monocytes Relative: 4 %
Neutro Abs: 13.8 10*3/uL — ABNORMAL HIGH (ref 1.7–7.7)
Neutrophils Relative %: 86 %
Platelets: 512 10*3/uL — ABNORMAL HIGH (ref 150–400)
RBC: 5.05 MIL/uL (ref 4.22–5.81)
RDW: 13.9 % (ref 11.5–15.5)
WBC: 16.1 10*3/uL — ABNORMAL HIGH (ref 4.0–10.5)
nRBC: 0 % (ref 0.0–0.2)

## 2023-07-07 LAB — BASIC METABOLIC PANEL
Anion gap: 6 (ref 5–15)
BUN: 13 mg/dL (ref 6–20)
CO2: 27 mmol/L (ref 22–32)
Calcium: 8.5 mg/dL — ABNORMAL LOW (ref 8.9–10.3)
Chloride: 100 mmol/L (ref 98–111)
Creatinine, Ser: 0.5 mg/dL — ABNORMAL LOW (ref 0.61–1.24)
GFR, Estimated: >60 mL/min (ref >60)
Glucose, Bld: 181 mg/dL — ABNORMAL HIGH (ref 70–99)
Potassium: 3.7 mmol/L (ref 3.5–5.1)
Sodium: 133 mmol/L — ABNORMAL LOW (ref 135–145)

## 2023-07-07 LAB — CBG MONITORING, ED
Glucose-Capillary: 169 mg/dL — ABNORMAL HIGH (ref 70–99)
Glucose-Capillary: 202 mg/dL — ABNORMAL HIGH (ref 70–99)
Glucose-Capillary: 205 mg/dL — ABNORMAL HIGH (ref 70–99)
Glucose-Capillary: 218 mg/dL — ABNORMAL HIGH (ref 70–99)
Glucose-Capillary: 319 mg/dL — ABNORMAL HIGH (ref 70–99)
Glucose-Capillary: 327 mg/dL — ABNORMAL HIGH (ref 70–99)
Glucose-Capillary: 408 mg/dL — ABNORMAL HIGH (ref 70–99)
Glucose-Capillary: 431 mg/dL — ABNORMAL HIGH (ref 70–99)
Glucose-Capillary: 489 mg/dL — ABNORMAL HIGH (ref 70–99)
Glucose-Capillary: >600 mg/dL (ref 70–99)
Glucose-Capillary: >600 mg/dL (ref 70–99)

## 2023-07-07 LAB — URINALYSIS, ROUTINE W REFLEX MICROSCOPIC
Bacteria, UA: NONE SEEN
Bilirubin Urine: NEGATIVE
Glucose, UA: >=500 mg/dL — AB
Hgb urine dipstick: NEGATIVE
Ketones, ur: 20 mg/dL — AB
Leukocytes,Ua: NEGATIVE
Nitrite: NEGATIVE
Protein, ur: NEGATIVE mg/dL
Specific Gravity, Urine: 1.028 (ref 1.005–1.030)
pH: 5 (ref 5.0–8.0)

## 2023-07-07 LAB — COMPREHENSIVE METABOLIC PANEL
ALT: 169 U/L — ABNORMAL HIGH (ref 0–44)
AST: 54 U/L — ABNORMAL HIGH (ref 15–41)
Albumin: 3.8 g/dL (ref 3.5–5.0)
Alkaline Phosphatase: 1391 U/L — ABNORMAL HIGH (ref 38–126)
Anion gap: 21 — ABNORMAL HIGH (ref 5–15)
BUN: 18 mg/dL (ref 6–20)
CO2: 21 mmol/L — ABNORMAL LOW (ref 22–32)
Calcium: 9.3 mg/dL (ref 8.9–10.3)
Chloride: 84 mmol/L — ABNORMAL LOW (ref 98–111)
Creatinine, Ser: 1.01 mg/dL (ref 0.61–1.24)
GFR, Estimated: >60 mL/min (ref >60)
Glucose, Bld: 739 mg/dL (ref 70–99)
Potassium: 4.1 mmol/L (ref 3.5–5.1)
Sodium: 126 mmol/L — ABNORMAL LOW (ref 135–145)
Total Bilirubin: 1.2 mg/dL (ref 0.0–1.2)
Total Protein: 7.8 g/dL (ref 6.5–8.1)

## 2023-07-07 LAB — BLOOD GAS, VENOUS
Acid-base deficit: 5 mmol/L — ABNORMAL HIGH (ref 0.0–2.0)
Bicarbonate: 21.1 mmol/L (ref 20.0–28.0)
O2 Saturation: 61.3 %
Patient temperature: 37
pCO2, Ven: 42 mm[Hg] — ABNORMAL LOW (ref 44–60)
pH, Ven: 7.31 (ref 7.25–7.43)
pO2, Ven: 33 mm[Hg] (ref 32–45)

## 2023-07-07 LAB — OSMOLALITY: Osmolality: 313 mosm/kg — ABNORMAL HIGH (ref 275–295)

## 2023-07-07 LAB — I-STAT CG4 LACTIC ACID, ED: Lactic Acid, Venous: 1.2 mmol/L (ref 0.5–1.9)

## 2023-07-07 LAB — LIPASE, BLOOD: Lipase: 97 U/L — ABNORMAL HIGH (ref 11–51)

## 2023-07-07 LAB — BETA-HYDROXYBUTYRIC ACID
Beta-Hydroxybutyric Acid: 6.38 mmol/L — ABNORMAL HIGH (ref 0.05–0.27)
Beta-Hydroxybutyric Acid: 0.27 mmol/L (ref 0.05–0.27)

## 2023-07-07 MED ORDER — FENTANYL CITRATE PF 50 MCG/ML IJ SOSY
50.0000 ug | PREFILLED_SYRINGE | Freq: Once | INTRAMUSCULAR | Status: AC
  Administered 2023-07-07: 50 ug via INTRAVENOUS
  Filled 2023-07-07: qty 1

## 2023-07-07 MED ORDER — POTASSIUM CHLORIDE 10 MEQ/100ML IV SOLN
10.0000 meq | INTRAVENOUS | Status: AC
  Administered 2023-07-07 (×2): 10 meq via INTRAVENOUS
  Filled 2023-07-07 (×2): qty 100

## 2023-07-07 MED ORDER — DEXTROSE IN LACTATED RINGERS 5 % IV SOLN: INTRAVENOUS | Status: DC

## 2023-07-07 MED ORDER — OXYCODONE HCL 5 MG PO TABS
10.0000 mg | ORAL_TABLET | Freq: Four times a day (QID) | ORAL | Status: DC | PRN
  Administered 2023-07-07 – 2023-07-08 (×3): 10 mg via ORAL
  Filled 2023-07-07 (×3): qty 2

## 2023-07-07 MED ORDER — LACTATED RINGERS IV BOLUS
20.0000 mL/kg | Freq: Once | INTRAVENOUS | Status: AC
  Administered 2023-07-07: 1434 mL via INTRAVENOUS

## 2023-07-07 MED ORDER — GADOBUTROL 1 MMOL/ML IV SOLN
7.0000 mL | Freq: Once | INTRAVENOUS | Status: AC | PRN
  Administered 2023-07-07: 7 mL via INTRAVENOUS

## 2023-07-07 MED ORDER — SODIUM CHLORIDE 0.9 % IV BOLUS
1000.0000 mL | Freq: Once | INTRAVENOUS | Status: AC
  Administered 2023-07-07: 1000 mL via INTRAVENOUS

## 2023-07-07 MED ORDER — IOHEXOL 300 MG/ML  SOLN
100.0000 mL | Freq: Once | INTRAMUSCULAR | Status: AC | PRN
  Administered 2023-07-07: 100 mL via INTRAVENOUS

## 2023-07-07 MED ORDER — DEXTROSE 50 % IV SOLN: 0.0000 mL | INTRAVENOUS | Status: DC | PRN

## 2023-07-07 MED ORDER — LACTATED RINGERS IV SOLN: INTRAVENOUS | Status: DC

## 2023-07-07 MED ORDER — LORAZEPAM 2 MG/ML IJ SOLN
1.0000 mg | Freq: Once | INTRAMUSCULAR | Status: AC
  Administered 2023-07-07: 1 mg via INTRAVENOUS
  Filled 2023-07-07: qty 1

## 2023-07-07 MED ORDER — LACTATED RINGERS IV BOLUS
20.0000 mL/kg | Freq: Once | INTRAVENOUS | Status: AC
Start: 2023-07-07 — End: 2023-07-07
  Administered 2023-07-07: 1434 mL via INTRAVENOUS

## 2023-07-07 MED ORDER — SENNOSIDES-DOCUSATE SODIUM 8.6-50 MG PO TABS: 1.0000 | ORAL_TABLET | Freq: Every evening | ORAL | Status: DC | PRN

## 2023-07-07 MED ORDER — MORPHINE SULFATE (PF) 2 MG/ML IV SOLN
2.0000 mg | INTRAVENOUS | Status: AC | PRN
  Administered 2023-07-07 – 2023-07-08 (×2): 2 mg via INTRAVENOUS
  Filled 2023-07-07 (×2): qty 1

## 2023-07-07 MED ORDER — LACTATED RINGERS IV BOLUS
1000.0000 mL | Freq: Once | INTRAVENOUS | Status: AC
  Administered 2023-07-07: 1000 mL via INTRAVENOUS

## 2023-07-07 MED ORDER — ONDANSETRON HCL 4 MG/2ML IJ SOLN: 4.0000 mg | Freq: Four times a day (QID) | INTRAMUSCULAR | Status: DC | PRN

## 2023-07-07 MED ORDER — INSULIN REGULAR(HUMAN) IN NACL 100-0.9 UT/100ML-% IV SOLN
INTRAVENOUS | Status: DC
Start: 2023-07-07 — End: 2023-07-08
  Administered 2023-07-07: 6.5 [IU]/h via INTRAVENOUS
  Filled 2023-07-07 (×2): qty 100

## 2023-07-07 MED ORDER — INSULIN REGULAR(HUMAN) IN NACL 100-0.9 UT/100ML-% IV SOLN: INTRAVENOUS | Status: DC

## 2023-07-07 MED ORDER — ENOXAPARIN SODIUM 40 MG/0.4ML IJ SOSY
40.0000 mg | PREFILLED_SYRINGE | INTRAMUSCULAR | Status: DC
  Administered 2023-07-07: 40 mg via SUBCUTANEOUS
  Filled 2023-07-07: qty 0.4

## 2023-07-07 NOTE — ED Provider Notes (Signed)
 Patient here with generalized weakness low back pain abdominal pain weakness in his legs and pain with walking.  Patient with schizophrenia bipolar chronic abdominal pain back pain.  He appears neurologically intact on exam.  Is got good rectal tone.  He has symmetric weakness in his lower legs as well as his arms.  He already has a blood sugar greater than 700.  He has been started on DKA protocol.  He had an MRI of his thoracic and lumbar spine that was overall unremarkable.  He is got some disc herniation in his low back but does not appear to have cauda equina or any emergent neurosurgical process at this time.  There is a question of may be a need for repeat MRI to evaluate for malignant process.  Otherwise abdominal CT is unremarkable.  Will admit him for further hydration and DKA care.  This chart was dictated using voice recognition software.  Despite best efforts to proofread,  errors can occur which can change the documentation meaning.    Ruthe Cornet, DO 07/07/23 1758

## 2023-07-07 NOTE — ED Notes (Signed)
 MRI contacted and to get pt. IV insulin to be started when back to room. EDP made aware.

## 2023-07-07 NOTE — ED Triage Notes (Addendum)
 BIB family from home, lives alone, here for BLE weakness, numbness, tingling, ataxia, abd pain, L lower back pain, constantly thirsty, urinary frequency, dry mouth, weight loss. Abd pain is chronic. Leg pain and ataxia onset 3d ago. Alert, NAD, calm, interactive. PCP instructed to come to ED. Able to bear weight and step around room.

## 2023-07-07 NOTE — Hospital Course (Signed)
 Steve Romero is a 53 y.o. male with medical history significant for post ERCP necrotizing pancreatitis with recurrent pseudocysts, s/p distal pancreatectomy, chronic abdominal pain, insulin -dependent diabetes, HTN, HLD, depression/anxiety who is admitted with DKA.

## 2023-07-07 NOTE — ED Notes (Signed)
 CRITICAL VALUE STICKER  CRITICAL VALUE: Glucose 739  RECEIVER (on-site recipient of call):I. Janit, RN  DATE & TIME NOTIFIED: 07/07/23 1433  MESSENGER (representative from lab):Tammy  MD NOTIFIED: Dreama  TIME OF NOTIFICATION:1434  RESPONSE:

## 2023-07-07 NOTE — H&P (Signed)
 History and Physical    Steve Romero FMW:[id number] DOB: 1971/04/24 DOA: 07/07/2023  PCP: Jolinda Norene HERO, DO  Patient coming from: Home  I have personally briefly reviewed patient's old medical records in [facility]  Chief Complaint: Generalized weakness, polyuria, polydipsia  HPI: Steve Romero is a 53 y.o. male with medical history significant for post ERCP necrotizing pancreatitis with recurrent pseudocysts, s/p distal pancreatectomy, chronic abdominal pain, insulin -dependent diabetes, HTN, HLD, depression/anxiety who presented to the ED for evaluation of back pain, polyuria, polydipsia, generalized weakness.  Patient states that for the last 2 days he has been feeling very thirsty and dehydrated.  He has been drinking a lot of water  but still has been feeling dehydrated.  He feels like he has cotton swabs in his mouth.  He has been urinating frequently.  He denies dysuria.  He had an episode of nausea and vomiting last night and this morning.  He has had worsening of his chronic abdominal pain rating across to his abdomen into his back.  He says that he has become weak in his legs and has been unable to hold his weight up.  He says he did have a fall without any injury.  He says he normally ambulates on her own power.  Patient states he is taking long-acting insulin  60 units every morning and 15 units short acting insulin  with meals.  He says he has a freestyle libre glucose monitor.  He says for the last few days it has been reading high.  Says his fingerstick glucometer has been malfunctioning.  He denies fevers, chills, diaphoresis, chest pain, dyspnea.  ED Course  Labs/Imaging on admission: I have personally reviewed following labs and imaging studies.  Initial vitals showed BP 125/87, pulse 81, RR 16, temp 97.9 F, SpO2 100% on room air.  Labs showed serum glucose 739, bicarb 21, BUN 18, creatinine 1.01, serum glucose 126 (141 when corrected for hypernatremia),  potassium 4.1, AST 54, ALT 169, alk phos 1391, total bilirubin 1.2, lipase 97, beta hydroxybutyrate 6.38.  MRI thoracic and lumbar spine negative for significant disc herniation, spinal canal stenosis or neuroforaminal narrowing of the thoracic spine.  Indeterminate tiny focus of enhancement in the posterior aspect of T5 vertebral body noted.  Severe disc height loss at L3-4 and L4-5 also seen.  CT abdomen/pelvis with contrast showed changes consistent with distal pancreatectomy with residual pseudocyst.  Largest of these now measures 4.4 cm increased in size from September 2024.  No findings to suggest acute pancreatitis.  No acute abnormality noted.  Patient was given over 4 L IV fluids, IV K 10 mEq x 2, IV Ativan  1 mg, IV fentanyl  50 mcg, and started on insulin  infusion.  The hospitalist service was consulted to admit for further evaluation and management.  Review of Systems: All systems reviewed and are negative except as documented in history of present illness above.   Past Medical History:  Diagnosis Date   Acute pancreatitis without necrosis or infection, unspecified    AKI (acute kidney injury) (HCC) 05/17/2022   Anxiety 10/2014   Ascites    Bipolar disorder [facility]) age 44   COPD (chronic obstructive pulmonary disease) (HCC) 2016   Depression age 59   Enlarged prostate    Hyperlipidemia 2016   Hypertension 2013   Nausea vomiting and diarrhea 06/15/2022   Schizoaffective disorder (HCC) 11/13/2014   Sleep apnea    Does not wear c-pap, sleeps in sitting up position per pt   Thyroid   disease 11/2014    Past Surgical History:  Procedure Laterality Date   BALLOON DILATION N/A 11/07/2020   Procedure: BALLOON DILATION;  Surgeon: Mansouraty, Aloha Raddle., MD;  Location: [facility] ENDOSCOPY;  Service: Gastroenterology;  Laterality: N/A;   BALLOON DILATION N/A 06/26/2021   Procedure: BALLOON DILATION;  Surgeon: Wilhelmenia Aloha Raddle., MD;  Location: [facility] ENDOSCOPY;  Service: Gastroenterology;   Laterality: N/A;   BILIARY DILATION  07/24/2021   Procedure: BILIARY DILATION;  Surgeon: Wilhelmenia Aloha Raddle., MD;  Location: [facility] ENDOSCOPY;  Service: Gastroenterology;;   BILIARY DILATION  05/22/2022   Procedure: BILIARY DILATION;  Surgeon: Wilhelmenia Aloha Raddle., MD;  Location: [facility]COPY;  Service: Gastroenterology;;   BILIARY DILATION  06/16/2022   Procedure: GASTROSTOMY DILATION;  Surgeon: Wilhelmenia Aloha Raddle., MD;  Location: [facility]OSCOPY;  Service: Gastroenterology;;   BILIARY STENT PLACEMENT N/A 10/10/2020   Procedure: BILIARY STENT PLACEMENT;  Surgeon: Golda Claudis PENNER, MD;  Location: [facility];  Service: Endoscopy;  Laterality: N/A;   BILIARY STENT PLACEMENT  11/09/2020   Procedure: BILIARY STENT PLACEMENT;  Surgeon: Wilhelmenia Aloha Raddle., MD;  Location: [facility] ENDOSCOPY;  Service: Gastroenterology;;   BILIARY STENT PLACEMENT  11/07/2020   Procedure: BILIARY STENT PLACEMENT;  Surgeon: Wilhelmenia Aloha Raddle., MD;  Location: [facility] ENDOSCOPY;  Service: Gastroenterology;;   BILIARY STENT PLACEMENT  11/11/2020   Procedure: BILIARY STENT PLACEMENT;  Surgeon: Wilhelmenia Aloha Raddle., MD;  Location: [facility] ENDOSCOPY;  Service: Gastroenterology;;   BILIARY STENT PLACEMENT  11/14/2020   Procedure: BILIARY STENT PLACEMENT;  Surgeon: Wilhelmenia Aloha Raddle., MD;  Location: [facility] ENDOSCOPY;  Service: Gastroenterology;;   BILIARY STENT PLACEMENT N/A 01/29/2021   Procedure: BILIARY STENT PLACEMENT;  Surgeon: Wilhelmenia Aloha Raddle., MD;  Location: [facility];  Service: Gastroenterology;  Laterality: N/A;   BILIARY STENT PLACEMENT  06/26/2021   Procedure: BILIARY STENT PLACEMENT;  Surgeon: Wilhelmenia Aloha Raddle., MD;  Location: [facility] ENDOSCOPY;  Service: Gastroenterology;;   BILIARY STENT PLACEMENT  07/24/2021   Procedure: BILIARY STENT PLACEMENT;  Surgeon: Wilhelmenia Aloha Raddle., MD;  Location: [facility] ENDOSCOPY;  Service: Gastroenterology;;   BIOPSY  11/07/2020   Procedure: BIOPSY;  Surgeon: Wilhelmenia Aloha Raddle., MD;  Location: [facility] ENDOSCOPY;  Service: Gastroenterology;;   BIOPSY  12/20/2020   Procedure: BIOPSY;  Surgeon: Wilhelmenia Aloha Raddle., MD;  Location: [facility];  Service: Gastroenterology;;   BIOPSY  06/26/2021   Procedure: BIOPSY;  Surgeon: Wilhelmenia Aloha Raddle., MD;  Location: [facility] ENDOSCOPY;  Service: Gastroenterology;;   CARPAL TUNNEL RELEASE Left 02/21/2016   Procedure: CARPAL TUNNEL RELEASE;  Surgeon: Taft FORBES Minerva, MD;  Location: [facility];  Service: Orthopedics;  Laterality: Left;   CHOLECYSTECTOMY N/A 10/09/2020   Procedure: LAPAROSCOPIC CHOLECYSTECTOMY;  Surgeon: Kallie Manuelita BROCKS, MD;  Location: [facility];  Service: General;  Laterality: N/A;   CYST ENTEROSTOMY N/A 11/07/2020   Procedure: CYST ENTEROSTOMY;  Surgeon: Wilhelmenia Aloha Raddle., MD;  Location: [facility] ENDOSCOPY;  Service: Gastroenterology;  Laterality: N/A;   CYST GASTROSTOMY  11/11/2020   Procedure: CYST NECROSECTOMY;  Surgeon: Wilhelmenia Aloha Raddle., MD;  Location: [facility] ENDOSCOPY;  Service: Gastroenterology;;   CYST GASTROSTOMY  11/14/2020   Procedure: CYST NECROSECTOMY;  Surgeon: Wilhelmenia Aloha Raddle., MD;  Location: [facility] ENDOSCOPY;  Service: Gastroenterology;;   CYST GASTROSTOMY  06/16/2022   Procedure: CYST GASTROSTOMY;  Surgeon: Wilhelmenia Aloha Raddle., MD;  Location: [facility]COPY;  Service: Gastroenterology;;   CYST REMOVAL HAND     CYSTOSCOPY  06/26/2021   Procedure: CYSTOGASTROSTOMY;  Surgeon: Mansouraty, Aloha Raddle., MD;  Location: [facility] ENDOSCOPY;  Service: Gastroenterology;;   ENDOSCOPIC RETROGRADE CHOLANGIOPANCREATOGRAPHY (ERCP) WITH PROPOFOL  N/A 11/09/2020   Procedure: ENDOSCOPIC RETROGRADE CHOLANGIOPANCREATOGRAPHY (ERCP) WITH PROPOFOL ;  Surgeon: Wilhelmenia Aloha Raddle., MD;  Location: [facility] ENDOSCOPY;  Service: Gastroenterology;  Laterality: N/A;   ENDOSCOPIC RETROGRADE CHOLANGIOPANCREATOGRAPHY (ERCP) WITH PROPOFOL  N/A 12/20/2020   Procedure: ENDOSCOPIC RETROGRADE CHOLANGIOPANCREATOGRAPHY (ERCP) WITH  PROPOFOL ;  Surgeon: Wilhelmenia Aloha Raddle., MD;  Location: [facility] ENDOSCOPY;  Service: Gastroenterology;  Laterality: N/A;   ENDOSCOPIC RETROGRADE CHOLANGIOPANCREATOGRAPHY (ERCP) WITH PROPOFOL  N/A 01/29/2021   Procedure: ENDOSCOPIC RETROGRADE CHOLANGIOPANCREATOGRAPHY (ERCP) WITH PROPOFOL ;  Surgeon: Wilhelmenia Aloha Raddle., MD;  Location: [facility] ENDOSCOPY;  Service: Gastroenterology;  Laterality: N/A;   ENDOSCOPIC RETROGRADE CHOLANGIOPANCREATOGRAPHY (ERCP) WITH PROPOFOL  N/A 07/24/2021   Procedure: ENDOSCOPIC RETROGRADE CHOLANGIOPANCREATOGRAPHY (ERCP) WITH PROPOFOL ;  Surgeon: Wilhelmenia Aloha Raddle., MD;  Location: [facility] ENDOSCOPY;  Service: Gastroenterology;  Laterality: N/A;   ERCP N/A 10/10/2020   Procedure: ENDOSCOPIC RETROGRADE CHOLANGIOPANCREATOGRAPHY (ERCP);  Surgeon: Golda Claudis PENNER, MD;  Location: [facility];  Service: Endoscopy;  Laterality: N/A;   ERCP     ERCP N/A 05/22/2022   Procedure: ENDOSCOPIC RETROGRADE CHOLANGIOPANCREATOGRAPHY (ERCP);  Surgeon: Wilhelmenia Aloha Raddle., MD;  Location: [facility];  Service: Gastroenterology;  Laterality: N/A;   ESOPHAGOGASTRODUODENOSCOPY N/A 11/11/2020   Procedure: ESOPHAGOGASTRODUODENOSCOPY (EGD);  Surgeon: Wilhelmenia Aloha Raddle., MD;  Location: [facility] ENDOSCOPY;  Service: Gastroenterology;  Laterality: N/A;   ESOPHAGOGASTRODUODENOSCOPY N/A 11/14/2020   Procedure: ESOPHAGOGASTRODUODENOSCOPY (EGD);  Surgeon: Wilhelmenia Aloha Raddle., MD;  Location: [facility] ENDOSCOPY;  Service: Gastroenterology;  Laterality: N/A;   ESOPHAGOGASTRODUODENOSCOPY (EGD) WITH PROPOFOL  N/A 11/09/2020   Procedure: ESOPHAGOGASTRODUODENOSCOPY (EGD) WITH PROPOFOL ;  Surgeon: Wilhelmenia Aloha Raddle., MD;  Location: [facility] ENDOSCOPY;  Service: Gastroenterology;  Laterality: N/A;   ESOPHAGOGASTRODUODENOSCOPY (EGD) WITH PROPOFOL  N/A 11/07/2020   Procedure: ESOPHAGOGASTRODUODENOSCOPY (EGD) WITH PROPOFOL ;  Surgeon: Wilhelmenia Aloha Raddle., MD;  Location: [facility] ENDOSCOPY;  Service: Gastroenterology;  Laterality:  N/A;   ESOPHAGOGASTRODUODENOSCOPY (EGD) WITH PROPOFOL  N/A 12/20/2020   Procedure: ESOPHAGOGASTRODUODENOSCOPY (EGD) WITH PROPOFOL ;  Surgeon: Wilhelmenia Aloha Raddle., MD;  Location: [facility] ENDOSCOPY;  Service: Gastroenterology;  Laterality: N/A;   ESOPHAGOGASTRODUODENOSCOPY (EGD) WITH PROPOFOL  N/A 06/26/2021   Procedure: ESOPHAGOGASTRODUODENOSCOPY (EGD) WITH PROPOFOL ;  Surgeon: Wilhelmenia Aloha Raddle., MD;  Location: [facility] ENDOSCOPY;  Service: Gastroenterology;  Laterality: N/A;   ESOPHAGOGASTRODUODENOSCOPY (EGD) WITH PROPOFOL  N/A 07/24/2021   Procedure: ESOPHAGOGASTRODUODENOSCOPY (EGD) WITH PROPOFOL ;  Surgeon: Wilhelmenia Aloha Raddle., MD;  Location: [facility] ENDOSCOPY;  Service: Gastroenterology;  Laterality: N/A;  AXIOS STENT   ESOPHAGOGASTRODUODENOSCOPY (EGD) WITH PROPOFOL  N/A 05/22/2022   Procedure: ESOPHAGOGASTRODUODENOSCOPY (EGD) WITH PROPOFOL ;  Surgeon: Wilhelmenia Aloha Raddle., MD;  Location: [facility]OSCOPY;  Service: Gastroenterology;  Laterality: N/A;   ESOPHAGOGASTRODUODENOSCOPY (EGD) WITH PROPOFOL  N/A 06/16/2022   Procedure: ESOPHAGOGASTRODUODENOSCOPY (EGD) WITH PROPOFOL ;  Surgeon: Wilhelmenia Aloha Raddle., MD;  Location: [facility] ENDOSCOPY;  Service: Gastroenterology;  Laterality: N/A;   ESOPHAGOGASTRODUODENOSCOPY (EGD) WITH PROPOFOL  N/A 07/30/2022   Procedure: ESOPHAGOGASTRODUODENOSCOPY (EGD) WITH PROPOFOL ;  Surgeon: Wilhelmenia Aloha Raddle., MD;  Location: [facility] ENDOSCOPY;  Service: Gastroenterology;  Laterality: N/A;   ESOPHAGOGASTRODUODENOSCOPY (EGD) WITH PROPOFOL  N/A 03/30/2023   Procedure: ESOPHAGOGASTRODUODENOSCOPY (EGD) WITH PROPOFOL ;  Surgeon: Avram Lupita BRAVO, MD;  Location: [facility] ENDOSCOPY;  Service: Gastroenterology;  Laterality: N/A;   EUS  11/14/2020   Procedure: UPPER ENDOSCOPIC ULTRASOUND (EUS) LINEAR;  Surgeon: Wilhelmenia Aloha Raddle., MD;  Location: [facility] ENDOSCOPY;  Service: Gastroenterology;;   EUS N/A 06/26/2021   Procedure: UPPER ENDOSCOPIC ULTRASOUND (EUS) RADIAL;  Surgeon: Wilhelmenia Aloha Raddle.,  MD;  Location: [facility] ENDOSCOPY;  Service: Gastroenterology;  Laterality: N/A;   EUS N/A 05/22/2022  Procedure: UPPER ENDOSCOPIC ULTRASOUND (EUS) LINEAR;  Surgeon: Wilhelmenia Aloha Raddle., MD;  Location: [facility]OSCOPY;  Service: Gastroenterology;  Laterality: N/A;   EUS N/A 06/16/2022   Procedure: UPPER ENDOSCOPIC ULTRASOUND (EUS) LINEAR;  Surgeon: Wilhelmenia Aloha Raddle., MD;  Location: [facility] ENDOSCOPY;  Service: Gastroenterology;  Laterality: N/A;   FINE NEEDLE ASPIRATION  05/22/2022   Procedure: FINE NEEDLE ASPIRATION;  Surgeon: Wilhelmenia Aloha Raddle., MD;  Location: [facility];  Service: Gastroenterology;;   GASTROINTESTINAL STENT REMOVAL  12/20/2020   Procedure: GASTROINTESTINAL STENT REMOVAL;  Surgeon: Wilhelmenia Aloha Raddle., MD;  Location: [facility] ENDOSCOPY;  Service: Gastroenterology;;  cyst gastrostomy stent and double pig tail stents x2 removed   HERNIA REPAIR Left 2002   groin   INGUINAL HERNIA REPAIR Left 04/05/2017   Procedure: RECURRENT HERNIA REPAIR INGUINAL ADULT WITH MESH;  Surgeon: Mavis Anes, MD;  Location: [facility];  Service: General;  Laterality: Left;   MASS EXCISION Right 04/29/2020   Procedure: EXCISION MASS RIGHT WRIST;  Surgeon: Murrell Drivers, MD;  Location: [facility];  Service: Orthopedics;  Laterality: Right;   MASS EXCISION Right 10/09/2020   Procedure: EXCISION MASS, ABDOMINAL WALL, 2CM;  Surgeon: Kallie Manuelita BROCKS, MD;  Location: [facility];  Service: General;  Laterality: Right;   PANCREATIC STENT PLACEMENT  06/26/2021   Procedure: AXIOS STENT PLACEMENT;  Surgeon: Wilhelmenia Aloha Raddle., MD;  Location: [facility] ENDOSCOPY;  Service: Gastroenterology;;   PANCREATIC STENT PLACEMENT  06/16/2022   Procedure: PANCREATIC STENT PLACEMENT;  Surgeon: Wilhelmenia Aloha Raddle., MD;  Location: [facility];  Service: Gastroenterology;;   REMOVAL OF STONES  12/20/2020   Procedure: REMOVAL OF STONES;  Surgeon: Wilhelmenia Aloha Raddle., MD;  Location: [facility];  Service:  Gastroenterology;;   REMOVAL OF STONES  07/24/2021   Procedure: REMOVAL OF STONES;  Surgeon: Wilhelmenia Aloha Raddle., MD;  Location: [facility] ENDOSCOPY;  Service: Gastroenterology;;   REMOVAL OF STONES  05/22/2022   Procedure: REMOVAL OF STONES;  Surgeon: Wilhelmenia Aloha Raddle., MD;  Location: [facility];  Service: Gastroenterology;;   ANNETT N/A 10/10/2020   Procedure: ANNETT;  Surgeon: Golda Claudis PENNER, MD;  Location: [facility];  Service: Endoscopy;  Laterality: N/A;   SPHINCTEROTOMY  07/24/2021   Procedure: SPHINCTEROTOMY;  Surgeon: Mansouraty, Aloha Raddle., MD;  Location: [facility] ENDOSCOPY;  Service: Gastroenterology;;   SPLENECTOMY, TOTAL N/A 11/17/2022   Procedure: SPLENECTOMY;  Surgeon: Dasie Leonor CROME, MD;  Location: [facility] OR;  Service: General;  Laterality: N/A;   SPYGLASS CHOLANGIOSCOPY N/A 12/20/2020   Procedure: DEBHOJDD CHOLANGIOSCOPY;  Surgeon: Wilhelmenia Aloha Raddle., MD;  Location: [facility];  Service: Gastroenterology;  Laterality: N/A;   SPYGLASS CHOLANGIOSCOPY N/A 07/24/2021   Procedure: SPYGLASS CHOLANGIOSCOPY;  Surgeon: Wilhelmenia Aloha Raddle., MD;  Location: [facility] ENDOSCOPY;  Service: Gastroenterology;  Laterality: N/A;   STENT REMOVAL  11/09/2020   Procedure: STENT REMOVAL;  Surgeon: Wilhelmenia Aloha Raddle., MD;  Location: [facility] ENDOSCOPY;  Service: Gastroenterology;;   CLEDA REMOVAL  11/11/2020   Procedure: STENT REMOVAL;  Surgeon: Wilhelmenia Aloha Raddle., MD;  Location: [facility] ENDOSCOPY;  Service: Gastroenterology;;   CLEDA REMOVAL  11/14/2020   Procedure: STENT REMOVAL;  Surgeon: Wilhelmenia Aloha Raddle., MD;  Location: [facility] ENDOSCOPY;  Service: Gastroenterology;;   CLEDA REMOVAL  12/20/2020   Procedure: STENT REMOVAL;  Surgeon: Wilhelmenia Aloha Raddle., MD;  Location: [facility];  Service: Gastroenterology;;  biliary x2   STENT REMOVAL  07/24/2021   Procedure: AXIOS STENT REMOVAL;  Surgeon: Wilhelmenia Aloha Raddle., MD;  Location: [facility] ENDOSCOPY;  Service: Gastroenterology;;  STENT REMOVAL  07/30/2022   Procedure: STENT REMOVAL;  Surgeon: Wilhelmenia Aloha Raddle., MD;  Location: [facility];  Service: Gastroenterology;;   UPPER ESOPHAGEAL ENDOSCOPIC ULTRASOUND (EUS) N/A 11/07/2020   Procedure: UPPER ESOPHAGEAL ENDOSCOPIC ULTRASOUND (EUS);  Surgeon: Wilhelmenia Aloha Raddle., MD;  Location: [facility] ENDOSCOPY;  Service: Gastroenterology;  Laterality: N/A;   UPPER GASTROINTESTINAL ENDOSCOPY     WOUND DEBRIDEMENT  11/09/2020   Procedure: CYST NECROSECTOMY;  Surgeon: Wilhelmenia Aloha Raddle., MD;  Location: [facility] ENDOSCOPY;  Service: Gastroenterology;;    Social History:  reports that he has been smoking cigarettes. He has a 6.5 pack-year smoking history. He has never used smokeless tobacco. He reports that he does not drink alcohol and does not use drugs.  Allergies  Allergen Reactions   Desipramine Nausea Only and Rash    Family History  Problem Relation Age of Onset   Diabetes Father    Heart disease Father    Heart disease Maternal Grandmother    Stroke Maternal Grandfather    Colon cancer Neg Hx    Esophageal cancer Neg Hx    Inflammatory bowel disease Neg Hx    Liver disease Neg Hx    Pancreatic cancer Neg Hx    Rectal cancer Neg Hx    Stomach cancer Neg Hx      Prior to Admission medications   Medication Sig Start Date End Date Taking? Authorizing Provider  acetaminophen  (TYLENOL ) 500 MG tablet Take 2 tablets (1,000 mg total) by mouth every 8 (eight) hours as needed for mild pain. 11/23/22   Dasie Leonor CROME, MD  Continuous Blood Gluc Receiver (FREESTYLE LIBRE READER) DEVI 1 Units by Does not apply route daily. UAD to test BGs daily. Dx E11.9 09/25/22   Jolinda Potter M, DO  Continuous Glucose Sensor (FREESTYLE LIBRE 3 PLUS SENSOR) MISC Check BGs continuously. E11.9 Change sensor every 15 days. 04/20/23   Jolinda Potter HERO, DO  esomeprazole  (NEXIUM ) 20 MG capsule Take 1 capsule (20 mg total) by mouth daily. 03/31/23 03/30/24  Amin, Sumayya, MD  insulin  aspart  (NOVOLOG  FLEXPEN) 100 UNIT/ML FlexPen Inject 15 units into the skin twice daily with breakfast and dinner and 5 units with snacks. Maximum total daily dose 50 units. 06/11/23   Jolinda Potter HERO, DO  Insulin  Pen Needle (BD PEN NEEDLE NANO U/F) 32G X 4 MM MISC UAD to administer insulin  09/25/22   Jolinda Potter HERO, DO  lipase/protease/amylase (CREON ) 36000 UNITS CPEP capsule Take 3 capsules with meals and 1-2  capsule with snacks ( up too 2 snacks ) Patient taking differently: Take 36,000-108,000 Units by mouth See admin instructions. Take 108,000 units by mouth with each meal eaten and 36,000-72,000 units with each snack 01/20/23   Mansouraty, Aloha Raddle., MD  ondansetron  (ZOFRAN ) 4 MG tablet Take 1 tablet (4 mg total) by mouth every 6 (six) hours as needed for nausea. 03/31/23   Amin, Sumayya, MD  Oxycodone  HCl 10 MG TABS Take 1 tablet (10 mg total) by mouth every 6 (six) hours as needed. 06/08/23   Mansouraty, Aloha Raddle., MD  rosuvastatin  (CRESTOR ) 10 MG tablet Take 1 tablet (10 mg total) by mouth at bedtime. If you experience muscle aches take every other day 02/04/23   Mallipeddi, Vishnu P, MD  SEMGLEE , YFGN, 100 UNIT/ML Pen Inject 40-50 Units into the skin at bedtime. Take 48 units nightly. 06/11/23   Jolinda Potter HERO, DO    Physical Exam: Vitals:   07/07/23 1630 07/07/23 1745 07/07/23 1815 07/07/23 1830  BP:  110/76 108/64 (!) 116/90 (!) 122/91  Pulse: 62 66 (!) 55 (!) 59  Resp: 14 15 12 10   Temp:      TempSrc:      SpO2: 93% 95% 97% 98%  Weight:      Height:       Constitutional: Resting in bed, appears fatigued and somewhat uncomfortable Eyes: EOMI, lids and conjunctivae normal ENMT: Mucous membranes are dry. Posterior pharynx clear of any exudate or lesions.Normal dentition.  Neck: normal, supple, no masses. Respiratory: clear to auscultation bilaterally, no wheezing, no crackles. Normal respiratory effort. No accessory muscle use.  Cardiovascular: Regular rate and rhythm, no  murmurs / rubs / gallops. No extremity edema. 2+ pedal pulses. Abdomen: Generalized tenderness, no masses palpated. Musculoskeletal: no clubbing / cyanosis. No joint deformity upper and lower extremities. Good ROM, no contractures. Normal muscle tone.  Skin: no rashes, lesions, ulcers. No induration Neurologic: Sensation intact. Strength largely 5/5 in all 4.  Psychiatric: Normal judgment and insight. Alert and oriented x 3. Normal mood.   EKG: Not performed.  Assessment/Plan Principal Problem:   Diabetic ketoacidosis associated with diabetes mellitus due to underlying condition [facility]) Active Problems:   Hyperlipidemia   Chronic biliary pancreatitis (HCC)   Steve Romero is a 53 y.o. male with medical history significant for post ERCP necrotizing pancreatitis with recurrent pseudocysts, s/p distal pancreatectomy, chronic abdominal pain, insulin -dependent diabetes, HTN, HLD, depression/anxiety who is admitted with DKA.  Assessment and Plan: Insulin -dependent diabetes with diabetic ketoacidosis: Last hemoglobin A1c >14.0 06/08/2023.  Reports adherence to home insulin  regimen.  Home CGM has been reading high for several days per patient. -Continue IV insulin  infusion per protocol -Continue IV fluids as ordered -Follow serial BMET and transition to subcutaneous insulin  when able  Back pain: Suspect this is likely referred pain from chronic pancreatitis.  MRI lumbar and thoracic spine negative for significant disc herniation, spinal canal stenosis, or neuroforaminal narrowing.  Severe disc height loss at L3-4, L4-5 noted.  Indeterminate focus of enhancement in the posterior aspect of T5 vertebral body is noted.  This could reflect Modic type I degenerative endplate changes or small degenerative Schmorl's node. Consider follow-up MRI in 2-3 months to evaluate stability.  Chronic pancreatitis with chronic abdominal pain: History of complicated post ERCP necrotizing pancreatitis s/p distal  pancreatectomy with recurrent pseudocyst.  CT A/P without acute changes but does show residual pseudocyst increased in size to 4.4 cm. -Oxycodone  as needed for pain -Resume Creon  when placed on diet  Elevated LFTs: Alk phos chronically elevated, increased from recent baseline.  Mild elevations of AST and ALT noted.  Will repeat labs in AM.  Hyperlipidemia: Holding statin for now.   DVT prophylaxis: enoxaparin  (LOVENOX ) injection 40 mg Start: 07/07/23 2200 Code Status:   Code Status: Full Code  Family Communication: Discussed with patient, he has discussed with family Disposition Plan: From home and likely discharge to home pending clinical progress Consults called: None Severity of Illness: The appropriate patient status for this patient is INPATIENT. Inpatient status is judged to be reasonable and necessary in order to provide the required intensity of service to ensure the patient's safety. The patient's presenting symptoms, physical exam findings, and initial radiographic and laboratory data in the context of their chronic comorbidities is felt to place them at high risk for further clinical deterioration. Furthermore, it is not anticipated that the patient will be medically stable for discharge from the hospital within 2 midnights of admission.   * I certify that  at the point of admission it is my clinical judgment that the patient will require inpatient hospital care spanning beyond 2 midnights from the point of admission due to high intensity of service, high risk for further deterioration and high frequency of surveillance required.Steve Romero Jorie Blanch MD [facility]  If 7PM-7AM, please contact night-coverage [email or web address]  07/07/2023, 7:32 PM

## 2023-07-07 NOTE — ED Notes (Signed)
 Patient transported to MRI

## 2023-07-07 NOTE — ED Provider Notes (Signed)
 [place] EMERGENCY DEPARTMENT AT [facility] Provider Note   CSN: [id number] Arrival date & time: 07/07/23  1233     History  Chief Complaint  Patient presents with   Extremity Weakness    Steve Romero is a 53 y.o. male.  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    Reports that for the past 2 days has had difficulty walking due to his legs giving out on him and weakness to his bilateral lower extremities.  Steve Romero also reports a tingling/numbness through his groin and lower abdomen.  Steve Romero has increased thirst and increased urinary frequency.  Steve Romero denies urinary retention, but work reports that Steve Romero is going frequently and will at times have incontinence.  Denies any incontinence of stool.  Steve Romero has had constipation.  Steve Romero has chronic abdominal pain due to his history of pancreatitis which is worse.  Steve Romero has back pain which is new for the past 2 days located in the lower/middle back and over to the left side.  Steve Romero denies any fevers, history of IV drug use.  Reports Steve Romero did have a fall after Steve Romero was feeling weak and having back pain but no fall preceding that.  Steve Romero did have nausea and an episode of vomiting.  Reports Steve Romero has been taking his insulin  as prescribed.      Past Medical History:  Diagnosis Date   Acute pancreatitis without necrosis or infection, unspecified    AKI (acute kidney injury) (HCC) 05/17/2022   Anxiety 10/2014   Ascites    Bipolar disorder [facility]) age 37   COPD (chronic obstructive pulmonary  disease) (HCC) 2016   Depression age 5   Enlarged prostate    Hyperlipidemia 2016   Hypertension 2013   Nausea vomiting and diarrhea 06/15/2022   Schizoaffective disorder (HCC) 11/13/2014   Sleep apnea    Does not wear c-pap, sleeps in sitting up position per pt   Thyroid  disease 11/2014    Home Medications Prior to Admission medications   Medication Sig Start Date End Date Taking? Authorizing Provider  acetaminophen  (TYLENOL ) 500 MG tablet Take 2 tablets (1,000 mg total) by mouth every 8 (eight) hours as needed for mild pain. 11/23/22  Yes Dasie Leonor CROME, MD  Continuous Blood Gluc Receiver (FREESTYLE LIBRE READER) DEVI 1 Units by Does not apply route daily. UAD to test BGs daily. Dx E11.9 09/25/22  Yes Gottschalk, Ashly M, DO  Continuous Glucose Sensor (FREESTYLE LIBRE 3 PLUS SENSOR) MISC Check BGs continuously. E11.9 Change sensor every 15 days. 04/20/23  Yes Gottschalk, Norene M, DO  esomeprazole  (NEXIUM ) 20 MG capsule Take 1 capsule (20 mg total) by mouth daily. 03/31/23 03/30/24 Yes Caleen Qualia, MD  insulin  aspart (NOVOLOG  FLEXPEN) 100 UNIT/ML FlexPen Inject 15 units into the skin twice daily with breakfast and dinner and 5 units with snacks. Maximum total daily dose 50 units. 06/11/23  Yes Jolinda Norene M, DO  lipase/protease/amylase (CREON ) 36000 UNITS CPEP capsule Take 3 capsules with meals and 1-2  capsule with snacks ( up too 2 snacks ) Patient taking differently: Take 36,000-108,000 Units by mouth See admin instructions. Take 108,000 units by mouth  with each meal eaten and 36,000-72,000 units with each snack 01/20/23  Yes Mansouraty, Aloha Raddle., MD  naloxone  (NARCAN ) nasal spray 4 mg/0.1 mL Place 1 spray into the nose once. 06/08/23  Yes [provider]  Oxycodone  HCl 10 MG TABS Take 1 tablet (10 mg total) by mouth every 6 (six) hours as needed. 06/08/23  Yes Mansouraty, Aloha Raddle., MD  rosuvastatin  (CRESTOR ) 10 MG tablet Take 1 tablet (10 mg total) by mouth at bedtime. If  you experience muscle aches take every other day 02/04/23  Yes Mallipeddi, Vishnu P, MD  SEMGLEE , YFGN, 100 UNIT/ML Pen Inject 40-50 Units into the skin at bedtime. Take 48 units nightly. 06/11/23  Yes Jolinda Potter M, DO  Insulin  Pen Needle (BD PEN NEEDLE NANO U/F) 32G X 4 MM MISC UAD to administer insulin  09/25/22   Jolinda Potter HERO, DO      Allergies    Desipramine    Review of Systems   Review of Systems  Physical Exam Updated Vital Signs BP 113/80   Pulse 62   Temp (!) 97.5 F (36.4 C) (Oral)   Resp 12   Ht 5' 9 (1.753 m)   Wt 71.7 kg   SpO2 98%   BMI 23.33 kg/m  Physical Exam Vitals and nursing note reviewed.  Constitutional:      General: Steve Romero is not in acute distress.    Appearance: Steve Romero is well-developed. Steve Romero is not diaphoretic.  HENT:     Head: Normocephalic and atraumatic.  Eyes:     Conjunctiva/sclera: Conjunctivae normal.  Cardiovascular:     Rate and Rhythm: Normal rate and regular rhythm.     Heart sounds: Normal heart sounds. No murmur heard.    No friction rub. No gallop.  Pulmonary:     Effort: Pulmonary effort is normal. No respiratory distress.     Breath sounds: Normal breath sounds. No wheezing or rales.  Abdominal:     General: There is no distension.     Palpations: Abdomen is soft.     Tenderness: There is abdominal tenderness (diffuse). There is no guarding.  Musculoskeletal:        General: Tenderness (left flank, low thoracic spine, upper lumbar) present.     Cervical back: Normal range of motion.     Comments: 4/5 strength bilateral hip flexion, 4+/5 knee flexion/extension, strength dorsi./plantar flexion appears normal Reports altered sensation to left foot, reports normal sensation to groin at this time.  Skin:    General: Skin is warm and dry.  Neurological:     Mental Status: Steve Romero is alert and oriented to person, place, and time.     Motor: Weakness present.     Comments: Normal rectal tone     ED Results / Procedures / Treatments    Labs (all labs ordered are listed, but only abnormal results are displayed) Labs Reviewed  LIPASE, BLOOD - Abnormal; Notable for the following components:      Result Value   Lipase 97 (*)    All other components within normal limits  COMPREHENSIVE METABOLIC PANEL - Abnormal; Notable for the following components:   Sodium 126 (*)    Chloride 84 (*)    CO2 21 (*)    Glucose, Bld 739 (*)    AST 54 (*)    ALT 169 (*)    Alkaline Phosphatase 1,391 (*)    Anion gap 21 (*)    All other components within normal limits  URINALYSIS, ROUTINE W REFLEX MICROSCOPIC -  Abnormal; Notable for the following components:   Color, Urine STRAW (*)    Glucose, UA >=500 (*)    Ketones, ur 20 (*)    All other components within normal limits  CBC WITH DIFFERENTIAL/PLATELET - Abnormal; Notable for the following components:   WBC 16.1 (*)    Platelets 512 (*)    Neutro Abs 13.8 (*)    Abs Immature Granulocytes 0.17 (*)    All other components within normal limits  BETA-HYDROXYBUTYRIC ACID - Abnormal; Notable for the following components:   Beta-Hydroxybutyric Acid 6.38 (*)    All other components within normal limits  BLOOD GAS, VENOUS - Abnormal; Notable for the following components:   pCO2, Ven 42 (*)    Acid-base deficit 5.0 (*)    All other components within normal limits  BASIC METABOLIC PANEL - Abnormal; Notable for the following components:   Sodium 133 (*)    Glucose, Bld 181 (*)    Creatinine, Ser 0.50 (*)    Calcium  8.5 (*)    All other components within normal limits  CBG MONITORING, ED - Abnormal; Notable for the following components:   Glucose-Capillary >600 (*)    All other components within normal limits  CBG MONITORING, ED - Abnormal; Notable for the following components:   Glucose-Capillary >600 (*)    All other components within normal limits  CBG MONITORING, ED - Abnormal; Notable for the following components:   Glucose-Capillary 489 (*)    All other components within  normal limits  CBG MONITORING, ED - Abnormal; Notable for the following components:   Glucose-Capillary 431 (*)    All other components within normal limits  CBG MONITORING, ED - Abnormal; Notable for the following components:   Glucose-Capillary 408 (*)    All other components within normal limits  CBG MONITORING, ED - Abnormal; Notable for the following components:   Glucose-Capillary 327 (*)    All other components within normal limits  CBG MONITORING, ED - Abnormal; Notable for the following components:   Glucose-Capillary 319 (*)    All other components within normal limits  CBG MONITORING, ED - Abnormal; Notable for the following components:   Glucose-Capillary 202 (*)    All other components within normal limits  CBG MONITORING, ED - Abnormal; Notable for the following components:   Glucose-Capillary 205 (*)    All other components within normal limits  CBG MONITORING, ED - Abnormal; Notable for the following components:   Glucose-Capillary 218 (*)    All other components within normal limits  BETA-HYDROXYBUTYRIC ACID  OSMOLALITY  HIV ANTIBODY (ROUTINE TESTING W REFLEX)  BASIC METABOLIC PANEL  BASIC METABOLIC PANEL  BASIC METABOLIC PANEL  BETA-HYDROXYBUTYRIC ACID  HEPATIC FUNCTION PANEL  I-STAT CG4 LACTIC ACID, ED    EKG None  Radiology CT ABDOMEN PELVIS W CONTRAST Result Date: 07/07/2023 CLINICAL DATA:  Acute abdominal pain EXAM: CT ABDOMEN AND PELVIS WITH CONTRAST TECHNIQUE: Multidetector CT imaging of the abdomen and pelvis was performed using the standard protocol following bolus administration of intravenous contrast. RADIATION DOSE REDUCTION: This exam was performed according to the departmental dose-optimization program which includes automated exposure control, adjustment of the mA and/or kV according to patient size and/or use of iterative reconstruction technique. CONTRAST:  OMNIPAQUE  IOHEXOL  300 MG/ML  SOLN COMPARISON:  04/27/2023 CT, MRI from 05/24/2023  FINDINGS: Lower chest: No acute abnormality. Hepatobiliary: Gallbladder has been surgically removed. Biliary ductal dilatation is noted related to the post cholecystectomy state. The liver is within normal  limits. 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 Is residual pancreas is within normal limits. A cystic lesion is noted adjacent to the distal aspect of the residual pancreas. This measures 4.4 cm in greatest dimension slightly increased in size when compared with the prior exam. A smaller cystic lesion is noted measuring 18 mm in greatest dimension. These changes are stable from the prior MRI. No significant inflammatory changes are identified to suggest pancreatitis. Spleen: Surgically removed. Adrenals/Urinary Tract: Adrenal glands are within normal limits. Kidneys are well visualized bilaterally. No renal calculi or obstructive changes are noted. The ureters are within normal limits. The bladder is well distended. Stomach/Bowel: Fecal material is noted scattered throughout the colon without obstructive change. No inflammatory changes are noted. The appendix is within normal limits. Small bowel and stomach are within normal limits. Vascular/Lymphatic: No significant vascular findings are present. No enlarged abdominal or pelvic lymph nodes. Reproductive: Prostate is unremarkable. Other: No abdominal wall hernia or abnormality. No abdominopelvic ascites. Musculoskeletal: No acute or significant osseous findings. 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 The largest of these now measures 4.4 cm increased in size from September of 2024. No findings to suggest acute pancreatitis are noted. No acute abnormality noted. Electronically Signed   By: Oneil Devonshire M.D.   On: 07/07/2023 17:29   MR THORACIC SPINE W WO CONTRAST Result Date: 07/07/2023 CLINICAL DATA:  Mid-back pain; Low back pain, cauda equina syndrome suspected. EXAM: MRI THORACIC AND LUMBAR SPINE  WITHOUT AND WITH CONTRAST TECHNIQUE: Multiplanar and multiecho pulse sequences of the thoracic and lumbar spine were obtained without and with intravenous contrast. CONTRAST:  7mL GADAVIST  GADOBUTROL  1 MMOL/ML IV SOLN COMPARISON:  None Available. FINDINGS: MRI THORACIC SPINE FINDINGS Alignment:  Normal. Vertebrae: Tiny focus of enhancement in the posterior aspect of the T5 vertebral body along the inferior endplate (sagittal image 11 series 23), indeterminate. Otherwise normal vertebral body heights. Cord: Normal spinal cord signal and volume. No abnormal enhancement. Paraspinal and other soft tissues: Unremarkable. Disc levels: No significant disc herniation, spinal canal stenosis or neural foraminal narrowing in the thoracic spine. Mild right-greater-than-left facet arthropathy at T10-11. MRI LUMBAR SPINE FINDINGS Segmentation: Counting was performed from the cervicothoracic localizer sequence. 13 rib-bearing vertebral bodies. The lowest rib-bearing vertebral body is labeled L1. The lowest fully formed disc space is labeled L6-S1. Alignment:  Normal. Vertebrae: Severe disc height loss at L4-5 with Modic type 1 degenerative endplate marrow signal changes along the posterior aspect of the endplates. No evidence of acute fracture or suspicious marrow lesions. Conus medullaris: Extends to the L2 level and appears normal. No abnormal enhancement of the conus or cauda equina nerve roots. Paraspinal and other soft tissues: Unremarkable. Disc levels: T12-L1:  Normal. L1-L2:  Normal. L2-L3:  Normal. L3-L4: Disc bulge with superimposed small central disc extrusion with slight cranial migration. No spinal canal stenosis. Severe disc height loss contributes to moderate bilateral neural foraminal narrowing. L4-L5:  Mild bilateral facet arthropathy. L5-6: Normal. L6-S1: Small central disc protrusion. No spinal canal stenosis or neural foraminal narrowing. Mild right facet arthropathy. IMPRESSION: 1. No significant disc  herniation, spinal canal stenosis or neural foraminal narrowing in the thoracic spine. 2. Tiny focus of enhancement in the posterior aspect of the T5 vertebral body along the inferior endplate, indeterminate. In the absence of known primary malignancy, this could reflect Modic type 1 degenerative endplate changes or a small degenerative Schmorl's node. Consider follow-up MRI in 2-3 months to evaluate stability. 3. Severe disc height  loss at L3-4 with moderate bilateral neural foraminal narrowing. 4. Severe disc height loss at L4-5 with Modic type 1 degenerative endplate marrow signal changes along the posterior aspect of the endplates. Electronically Signed   By: Ryan Chess M.D.   On: 07/07/2023 17:20   MR Lumbar Spine W Wo Contrast Result Date: 07/07/2023 CLINICAL DATA:  Mid-back pain; Low back pain, cauda equina syndrome suspected. EXAM: MRI THORACIC AND LUMBAR SPINE WITHOUT AND WITH CONTRAST TECHNIQUE: Multiplanar and multiecho pulse sequences of the thoracic and lumbar spine were obtained without and with intravenous contrast. CONTRAST:  7mL GADAVIST  GADOBUTROL  1 MMOL/ML IV SOLN COMPARISON:  None Available. FINDINGS: MRI THORACIC SPINE FINDINGS Alignment:  Normal. Vertebrae: Tiny focus of enhancement in the posterior aspect of the T5 vertebral body along the inferior endplate (sagittal image 11 series 23), indeterminate. Otherwise normal vertebral body heights. Cord: Normal spinal cord signal and volume. No abnormal enhancement. Paraspinal and other soft tissues: Unremarkable. Disc levels: No significant disc herniation, spinal canal stenosis or neural foraminal narrowing in the thoracic spine. Mild right-greater-than-left facet arthropathy at T10-11. MRI LUMBAR SPINE FINDINGS Segmentation: Counting was performed from the cervicothoracic localizer sequence. 13 rib-bearing vertebral bodies. The lowest rib-bearing vertebral body is labeled L1. The lowest fully formed disc space is labeled L6-S1. Alignment:   Normal. Vertebrae: Severe disc height loss at L4-5 with Modic type 1 degenerative endplate marrow signal changes along the posterior aspect of the endplates. No evidence of acute fracture or suspicious marrow lesions. Conus medullaris: Extends to the L2 level and appears normal. No abnormal enhancement of the conus or cauda equina nerve roots. Paraspinal and other soft tissues: Unremarkable. Disc levels: T12-L1:  Normal. L1-L2:  Normal. L2-L3:  Normal. L3-L4: Disc bulge with superimposed small central disc extrusion with slight cranial migration. No spinal canal stenosis. Severe disc height loss contributes to moderate bilateral neural foraminal narrowing. L4-L5:  Mild bilateral facet arthropathy. L5-6: Normal. L6-S1: Small central disc protrusion. No spinal canal stenosis or neural foraminal narrowing. Mild right facet arthropathy. IMPRESSION: 1. No significant disc herniation, spinal canal stenosis or neural foraminal narrowing in the thoracic spine. 2. Tiny focus of enhancement in the posterior aspect of the T5 vertebral body along the inferior endplate, indeterminate. In the absence of known primary malignancy, this could reflect Modic type 1 degenerative endplate changes or a small degenerative Schmorl's node. Consider follow-up MRI in 2-3 months to evaluate stability. 3. Severe disc height loss at L3-4 with moderate bilateral neural foraminal narrowing. 4. Severe disc height loss at L4-5 with Modic type 1 degenerative endplate marrow signal changes along the posterior aspect of the endplates. Electronically Signed   By: Ryan Chess M.D.   On: 07/07/2023 17:20    Procedures .Critical Care  Performed by: Dreama Longs, MD Authorized by: Dreama Longs, MD   Critical care provider statement:    Critical care time (minutes):  30   Critical care was time spent personally by me on the following activities:  Development of treatment plan with patient or surrogate, evaluation of patient's response  to treatment, examination of patient, ordering and review of laboratory studies, ordering and performing treatments and interventions, pulse oximetry, re-evaluation of patient's condition and review of old charts     Medications Ordered in ED Medications  insulin  regular, human (MYXREDLIN ) 100 units/ 100 mL infusion (3.2 Units/hr Intravenous Rate/Dose Change 07/07/23 2114)  lactated ringers  infusion (0 mLs Intravenous Stopped 07/07/23 2006)  dextrose  5 % in lactated ringers  infusion ( Intravenous Infusion  Verify 07/07/23 2221)  dextrose  50 % solution 0-50 mL (has no administration in time range)  enoxaparin  (LOVENOX ) injection 40 mg (40 mg Subcutaneous Given 07/07/23 2222)  ondansetron  (ZOFRAN ) injection 4 mg (has no administration in time range)  morphine  (PF) 2 MG/ML injection 2 mg (has no administration in time range)  senna-docusate (Senokot-S) tablet 1 tablet (has no administration in time range)  oxyCODONE  (Oxy IR/ROXICODONE ) immediate release tablet 10 mg (10 mg Oral Given 07/07/23 2010)  sodium chloride  0.9 % bolus 1,000 mL (0 mLs Intravenous Stopped 07/07/23 1343)  LORazepam  (ATIVAN ) injection 1 mg (1 mg Intravenous Given 07/07/23 1513)  lactated ringers  bolus 1,434 mL (0 mLs Intravenous Stopped 07/07/23 1723)  lactated ringers  bolus 1,000 mL (0 mLs Intravenous Stopped 07/07/23 1723)  lactated ringers  bolus 1,434 mL (0 mLs Intravenous Stopped 07/07/23 1856)  potassium chloride  10 mEq in 100 mL IVPB (0 mEq Intravenous Stopped 07/07/23 1824)  gadobutrol  (GADAVIST ) 1 MMOL/ML injection 7 mL (7 mLs Intravenous Contrast Given 07/07/23 1612)  iohexol  (OMNIPAQUE ) 300 MG/ML solution 100 mL (100 mLs Intravenous Contrast Given 07/07/23 1643)  fentaNYL  (SUBLIMAZE ) injection 50 mcg (50 mcg Intravenous Given 07/07/23 1749)    ED Course/ Medical Decision Making/ A&P                                  53 year old male with a history of bipolar disorder, schizoaffective disorder, hypertension, hyperlipidemia, OSA,  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   Differential diagnosis includes cauda equina, other spinal pathology, DKA, HHS.     Labs completed and personally about interpreted by me show severe hyperglycemia with glucose of 739, bicarb of 21, anion gap of 21 beta hydroxybutyric acid of 6.38 consistent with DKA.  His pH is 7.3 with a pCO2 42.  Labs are also significant for leukocytosis, worsening transaminitis and alk phos, lipase 97 and decreased from prior.  Given severe back pain, groin paresthesias and weakness, concern for possible cauda equina or other acute spinal pathology.  Ordered MRI thoracic and lumbar spine with and without contrast to evaluate for signs of infection, transverse myelitis, or other acute disc pathology as etiology of his symptoms.  Due to his abdominal pain, and increasing transaminitis and alk phos, also order CT abdomen pelvis.  Plan for admission. Signed out with MRI, CT, admission plan pending.           Final Clinical Impression(s) / ED Diagnoses Final diagnoses:  Generalized weakness  Hyperglycemia  Diabetic ketoacidosis without coma associated with type 2 diabetes mellitus (HCC)  Acute midline low back pain without sciatica    Rx / DC Orders ED Discharge Orders     None         Dreama Longs, MD 07/07/23 2318

## 2023-07-08 DIAGNOSIS — E0810 Diabetes mellitus due to underlying condition with ketoacidosis without coma: Secondary | ICD-10-CM | POA: Diagnosis not present

## 2023-07-08 LAB — HEPATIC FUNCTION PANEL
ALT: 85 U/L — ABNORMAL HIGH (ref 0–44)
AST: 25 U/L (ref 15–41)
Albumin: 2.8 g/dL — ABNORMAL LOW (ref 3.5–5.0)
Alkaline Phosphatase: 899 U/L — ABNORMAL HIGH (ref 38–126)
Bilirubin, Direct: 0.1 mg/dL (ref 0.0–0.2)
Indirect Bilirubin: 0.4 mg/dL (ref 0.3–0.9)
Total Bilirubin: 0.5 mg/dL (ref 0.0–1.2)
Total Protein: 5.6 g/dL — ABNORMAL LOW (ref 6.5–8.1)

## 2023-07-08 LAB — CBG MONITORING, ED
Glucose-Capillary: 127 mg/dL — ABNORMAL HIGH (ref 70–99)
Glucose-Capillary: 153 mg/dL — ABNORMAL HIGH (ref 70–99)
Glucose-Capillary: 155 mg/dL — ABNORMAL HIGH (ref 70–99)
Glucose-Capillary: 156 mg/dL — ABNORMAL HIGH (ref 70–99)
Glucose-Capillary: 169 mg/dL — ABNORMAL HIGH (ref 70–99)

## 2023-07-08 LAB — BASIC METABOLIC PANEL
Anion gap: 7 (ref 5–15)
BUN: 14 mg/dL (ref 6–20)
CO2: 28 mmol/L (ref 22–32)
Calcium: 8.5 mg/dL — ABNORMAL LOW (ref 8.9–10.3)
Chloride: 101 mmol/L (ref 98–111)
Creatinine, Ser: 0.6 mg/dL — ABNORMAL LOW (ref 0.61–1.24)
GFR, Estimated: >60 mL/min (ref >60)
Glucose, Bld: 111 mg/dL — ABNORMAL HIGH (ref 70–99)
Potassium: 3.2 mmol/L — ABNORMAL LOW (ref 3.5–5.1)
Sodium: 136 mmol/L (ref 135–145)

## 2023-07-08 LAB — GLUCOSE, CAPILLARY
Glucose-Capillary: 194 mg/dL — ABNORMAL HIGH (ref 70–99)
Glucose-Capillary: 389 mg/dL — ABNORMAL HIGH (ref 70–99)

## 2023-07-08 LAB — BETA-HYDROXYBUTYRIC ACID
Beta-Hydroxybutyric Acid: 0.15 mmol/L (ref 0.05–0.27)
Beta-Hydroxybutyric Acid: 0.85 mmol/L — ABNORMAL HIGH (ref 0.05–0.27)

## 2023-07-08 LAB — HIV ANTIBODY (ROUTINE TESTING W REFLEX): HIV Screen 4th Generation wRfx: NONREACTIVE

## 2023-07-08 MED ORDER — PANCRELIPASE (LIP-PROT-AMYL) 36000-114000 UNITS PO CPEP: 36000.0000 [IU] | ORAL_CAPSULE | Freq: Two times a day (BID) | ORAL | Status: DC | PRN

## 2023-07-08 MED ORDER — INSULIN ASPART 100 UNIT/ML IJ SOLN
0.0000 [IU] | Freq: Three times a day (TID) | INTRAMUSCULAR | Status: DC
  Administered 2023-07-08: 15 [IU] via SUBCUTANEOUS
  Administered 2023-07-08: 3 [IU] via SUBCUTANEOUS
  Filled 2023-07-08: qty 0.15

## 2023-07-08 MED ORDER — INSULIN ASPART 100 UNIT/ML IJ SOLN
0.0000 [IU] | Freq: Every day | INTRAMUSCULAR | Status: DC
  Filled 2023-07-08: qty 0.05

## 2023-07-08 MED ORDER — SEMGLEE (YFGN) 100 UNIT/ML ~~LOC~~ SOPN: 62.0000 [IU] | PEN_INJECTOR | Freq: Every day | SUBCUTANEOUS | Status: DC

## 2023-07-08 MED ORDER — PANCRELIPASE (LIP-PROT-AMYL) 12000-38000 UNITS PO CPEP
36000.0000 [IU] | ORAL_CAPSULE | ORAL | Status: DC
Start: 2023-07-08 — End: 2023-07-08

## 2023-07-08 MED ORDER — PANCRELIPASE (LIP-PROT-AMYL) 36000-114000 UNITS PO CPEP
108000.0000 [IU] | ORAL_CAPSULE | Freq: Three times a day (TID) | ORAL | Status: DC
  Administered 2023-07-08: 108000 [IU] via ORAL
  Filled 2023-07-08 (×2): qty 3

## 2023-07-08 MED ORDER — PANCRELIPASE (LIP-PROT-AMYL) 12000-38000 UNITS PO CPEP: 36000.0000 [IU] | ORAL_CAPSULE | ORAL | Status: DC | PRN

## 2023-07-08 MED ORDER — PANCRELIPASE (LIP-PROT-AMYL) 12000-38000 UNITS PO CPEP: 108000.0000 [IU] | ORAL_CAPSULE | Freq: Three times a day (TID) | ORAL | Status: DC

## 2023-07-08 MED ORDER — INSULIN GLARGINE-YFGN 100 UNIT/ML ~~LOC~~ SOLN
40.0000 [IU] | SUBCUTANEOUS | Status: DC
Start: 2023-07-08 — End: 2023-07-08
  Administered 2023-07-08: 40 [IU] via SUBCUTANEOUS
  Filled 2023-07-08: qty 0.4

## 2023-07-08 MED ORDER — POTASSIUM CHLORIDE 20 MEQ PO PACK
40.0000 meq | PACK | Freq: Once | ORAL | Status: AC
  Administered 2023-07-08: 40 meq via ORAL
  Filled 2023-07-08: qty 2

## 2023-07-08 MED ORDER — NOVOLOG FLEXPEN 100 UNIT/ML ~~LOC~~ SOPN: PEN_INJECTOR | SUBCUTANEOUS | 3 refills | Status: DC

## 2023-07-08 NOTE — ED Notes (Signed)
 ED TO INPATIENT HANDOFF REPORT  ED Nurse Name and Phone #: Rosina RAMAN Name/Age/Gender Steve Romero 53 y.o. male Room/Bed: WA13/WA13  Code Status   Code Status: Full Code  Home/SNF/Other Home Patient oriented to: self, place, time, and situation Is this baseline? Yes   Triage Complete: Triage complete  Chief Complaint Diabetic ketoacidosis associated with diabetes mellitus due to underlying condition (HCC) [E08.10]  Triage Note BIB family from home, lives alone, here for BLE weakness, numbness, tingling, ataxia, abd pain, L lower back pain, constantly thirsty, urinary frequency, dry mouth, weight loss. Abd pain is chronic. Leg pain and ataxia onset 3d ago. Alert, NAD, calm, interactive. PCP instructed to come to ED. Able to bear weight and step around room.    Allergies Allergies  Allergen Reactions   Desipramine Nausea Only and Rash    Level of Care/Admitting Diagnosis ED Disposition     ED Disposition  Admit   Condition  --   Comment  Hospital [facility] [[id number]]  Level of Care: Med-Surg [16]  May admit patient to [facility] or [facility] if equivalent level of care is available:: No  Covid Evaluation: Asymptomatic - no recent exposure (last 10 days) testing not required  Diagnosis: Diabetic ketoacidosis associated with diabetes mellitus due to underlying condition [facility]) [[id number]]  Admitting Physician: TOBIE JORIE SAUNDERS [[id number]]  Attending Physician: TOBIE JORIE SAUNDERS [[id number]]  Certification:: I certify this patient will need inpatient services for at least 2 midnights  Expected Medical Readiness: 07/10/2023          B Medical/Surgery History Past Medical History:  Diagnosis Date   Acute pancreatitis without necrosis or infection, unspecified    AKI (acute kidney injury) (HCC) 05/17/2022   Anxiety 10/2014   Ascites    Bipolar disorder (HCC) age 25   COPD (chronic obstructive pulmonary disease) (HCC) 2016   Depression age 89    Enlarged prostate    Hyperlipidemia 2016   Hypertension 2013   Nausea vomiting and diarrhea 06/15/2022   Schizoaffective disorder (HCC) 11/13/2014   Sleep apnea    Does not wear c-pap, sleeps in sitting up position per pt   Thyroid  disease 11/2014   Past Surgical History:  Procedure Laterality Date   BALLOON DILATION N/A 11/07/2020   Procedure: BALLOON DILATION;  Surgeon: Wilhelmenia Aloha Raddle., MD;  Location: [facility] ENDOSCOPY;  Service: Gastroenterology;  Laterality: N/A;   BALLOON DILATION N/A 06/26/2021   Procedure: BALLOON DILATION;  Surgeon: Wilhelmenia Aloha Raddle., MD;  Location: [facility] ENDOSCOPY;  Service: Gastroenterology;  Laterality: N/A;   BILIARY DILATION  07/24/2021   Procedure: BILIARY DILATION;  Surgeon: Wilhelmenia Aloha Raddle., MD;  Location: [facility] ENDOSCOPY;  Service: Gastroenterology;;   BILIARY DILATION  05/22/2022   Procedure: BILIARY DILATION;  Surgeon: Wilhelmenia Aloha Raddle., MD;  Location: [facility]COPY;  Service: Gastroenterology;;   BILIARY DILATION  06/16/2022   Procedure: GASTROSTOMY DILATION;  Surgeon: Wilhelmenia Aloha Raddle., MD;  Location: [facility]OSCOPY;  Service: Gastroenterology;;   BILIARY STENT PLACEMENT N/A 10/10/2020   Procedure: BILIARY STENT PLACEMENT;  Surgeon: Golda Claudis PENNER, MD;  Location: [facility];  Service: Endoscopy;  Laterality: N/A;   BILIARY STENT PLACEMENT  11/09/2020   Procedure: BILIARY STENT PLACEMENT;  Surgeon: Wilhelmenia Aloha Raddle., MD;  Location: [facility] ENDOSCOPY;  Service: Gastroenterology;;   BILIARY STENT PLACEMENT  11/07/2020   Procedure: BILIARY STENT PLACEMENT;  Surgeon: Wilhelmenia Aloha Raddle., MD;  Location: [facility] ENDOSCOPY;  Service: Gastroenterology;;   BILIARY STENT PLACEMENT  11/11/2020   Procedure: BILIARY STENT PLACEMENT;  Surgeon: Wilhelmenia Aloha Raddle., MD;  Location: [facility] ENDOSCOPY;  Service: Gastroenterology;;   BILIARY STENT PLACEMENT  11/14/2020   Procedure: BILIARY STENT PLACEMENT;  Surgeon: Wilhelmenia Aloha Raddle., MD;   Location: [facility] ENDOSCOPY;  Service: Gastroenterology;;   BILIARY STENT PLACEMENT N/A 01/29/2021   Procedure: BILIARY STENT PLACEMENT;  Surgeon: Wilhelmenia Aloha Raddle., MD;  Location: [facility];  Service: Gastroenterology;  Laterality: N/A;   BILIARY STENT PLACEMENT  06/26/2021   Procedure: BILIARY STENT PLACEMENT;  Surgeon: Wilhelmenia Aloha Raddle., MD;  Location: [facility] ENDOSCOPY;  Service: Gastroenterology;;   BILIARY STENT PLACEMENT  07/24/2021   Procedure: BILIARY STENT PLACEMENT;  Surgeon: Wilhelmenia Aloha Raddle., MD;  Location: [facility] ENDOSCOPY;  Service: Gastroenterology;;   BIOPSY  11/07/2020   Procedure: BIOPSY;  Surgeon: Wilhelmenia Aloha Raddle., MD;  Location: [facility] ENDOSCOPY;  Service: Gastroenterology;;   BIOPSY  12/20/2020   Procedure: BIOPSY;  Surgeon: Wilhelmenia Aloha Raddle., MD;  Location: [facility]OSCOPY;  Service: Gastroenterology;;   BIOPSY  06/26/2021   Procedure: BIOPSY;  Surgeon: Wilhelmenia Aloha Raddle., MD;  Location: [facility] ENDOSCOPY;  Service: Gastroenterology;;   CARPAL TUNNEL RELEASE Left 02/21/2016   Procedure: CARPAL TUNNEL RELEASE;  Surgeon: Taft FORBES Minerva, MD;  Location: [facility];  Service: Orthopedics;  Laterality: Left;   CHOLECYSTECTOMY N/A 10/09/2020   Procedure: LAPAROSCOPIC CHOLECYSTECTOMY;  Surgeon: Kallie Manuelita BROCKS, MD;  Location: [facility];  Service: General;  Laterality: N/A;   CYST ENTEROSTOMY N/A 11/07/2020   Procedure: CYST ENTEROSTOMY;  Surgeon: Wilhelmenia Aloha Raddle., MD;  Location: [facility] ENDOSCOPY;  Service: Gastroenterology;  Laterality: N/A;   CYST GASTROSTOMY  11/11/2020   Procedure: CYST NECROSECTOMY;  Surgeon: Wilhelmenia Aloha Raddle., MD;  Location: [facility] ENDOSCOPY;  Service: Gastroenterology;;   CYST GASTROSTOMY  11/14/2020   Procedure: CYST NECROSECTOMY;  Surgeon: Wilhelmenia Aloha Raddle., MD;  Location: [facility] ENDOSCOPY;  Service: Gastroenterology;;   CYST GASTROSTOMY  06/16/2022   Procedure: CYST GASTROSTOMY;  Surgeon: Wilhelmenia Aloha Raddle., MD;  Location: [facility]  ENDOSCOPY;  Service: Gastroenterology;;   CYST REMOVAL HAND     CYSTOSCOPY  06/26/2021   Procedure: CYSTOGASTROSTOMY;  Surgeon: Mansouraty, Aloha Raddle., MD;  Location: [facility] ENDOSCOPY;  Service: Gastroenterology;;   ENDOSCOPIC RETROGRADE CHOLANGIOPANCREATOGRAPHY (ERCP) WITH PROPOFOL  N/A 11/09/2020   Procedure: ENDOSCOPIC RETROGRADE CHOLANGIOPANCREATOGRAPHY (ERCP) WITH PROPOFOL ;  Surgeon: Wilhelmenia Aloha Raddle., MD;  Location: [facility] ENDOSCOPY;  Service: Gastroenterology;  Laterality: N/A;   ENDOSCOPIC RETROGRADE CHOLANGIOPANCREATOGRAPHY (ERCP) WITH PROPOFOL  N/A 12/20/2020   Procedure: ENDOSCOPIC RETROGRADE CHOLANGIOPANCREATOGRAPHY (ERCP) WITH PROPOFOL ;  Surgeon: Wilhelmenia Aloha Raddle., MD;  Location: [facility] ENDOSCOPY;  Service: Gastroenterology;  Laterality: N/A;   ENDOSCOPIC RETROGRADE CHOLANGIOPANCREATOGRAPHY (ERCP) WITH PROPOFOL  N/A 01/29/2021   Procedure: ENDOSCOPIC RETROGRADE CHOLANGIOPANCREATOGRAPHY (ERCP) WITH PROPOFOL ;  Surgeon: Wilhelmenia Aloha Raddle., MD;  Location: [facility] ENDOSCOPY;  Service: Gastroenterology;  Laterality: N/A;   ENDOSCOPIC RETROGRADE CHOLANGIOPANCREATOGRAPHY (ERCP) WITH PROPOFOL  N/A 07/24/2021   Procedure: ENDOSCOPIC RETROGRADE CHOLANGIOPANCREATOGRAPHY (ERCP) WITH PROPOFOL ;  Surgeon: Wilhelmenia Aloha Raddle., MD;  Location: [facility] ENDOSCOPY;  Service: Gastroenterology;  Laterality: N/A;   ERCP N/A 10/10/2020   Procedure: ENDOSCOPIC RETROGRADE CHOLANGIOPANCREATOGRAPHY (ERCP);  Surgeon: Golda Claudis PENNER, MD;  Location: [facility];  Service: Endoscopy;  Laterality: N/A;   ERCP     ERCP N/A 05/22/2022   Procedure: ENDOSCOPIC RETROGRADE CHOLANGIOPANCREATOGRAPHY (ERCP);  Surgeon: Wilhelmenia Aloha Raddle., MD;  Location: [facility]COPY;  Service: Gastroenterology;  Laterality: N/A;   ESOPHAGOGASTRODUODENOSCOPY N/A 11/11/2020   Procedure: ESOPHAGOGASTRODUODENOSCOPY (EGD);  Surgeon: Wilhelmenia Aloha Raddle., MD;  Location: [facility] ENDOSCOPY;  Service: Gastroenterology;  Laterality: N/A;    ESOPHAGOGASTRODUODENOSCOPY N/A 11/14/2020   Procedure: ESOPHAGOGASTRODUODENOSCOPY (EGD);  Surgeon: Wilhelmenia Aloha Raddle., MD;  Location: [facility] ENDOSCOPY;  Service: Gastroenterology;  Laterality: N/A;   ESOPHAGOGASTRODUODENOSCOPY (EGD) WITH PROPOFOL  N/A 11/09/2020   Procedure: ESOPHAGOGASTRODUODENOSCOPY (EGD) WITH PROPOFOL ;  Surgeon: Wilhelmenia Aloha Raddle., MD;  Location: [facility] ENDOSCOPY;  Service: Gastroenterology;  Laterality: N/A;   ESOPHAGOGASTRODUODENOSCOPY (EGD) WITH PROPOFOL  N/A 11/07/2020   Procedure: ESOPHAGOGASTRODUODENOSCOPY (EGD) WITH PROPOFOL ;  Surgeon: Wilhelmenia Aloha Raddle., MD;  Location: [facility] ENDOSCOPY;  Service: Gastroenterology;  Laterality: N/A;   ESOPHAGOGASTRODUODENOSCOPY (EGD) WITH PROPOFOL  N/A 12/20/2020   Procedure: ESOPHAGOGASTRODUODENOSCOPY (EGD) WITH PROPOFOL ;  Surgeon: Wilhelmenia Aloha Raddle., MD;  Location: [facility] ENDOSCOPY;  Service: Gastroenterology;  Laterality: N/A;   ESOPHAGOGASTRODUODENOSCOPY (EGD) WITH PROPOFOL  N/A 06/26/2021   Procedure: ESOPHAGOGASTRODUODENOSCOPY (EGD) WITH PROPOFOL ;  Surgeon: Wilhelmenia Aloha Raddle., MD;  Location: [facility] ENDOSCOPY;  Service: Gastroenterology;  Laterality: N/A;   ESOPHAGOGASTRODUODENOSCOPY (EGD) WITH PROPOFOL  N/A 07/24/2021   Procedure: ESOPHAGOGASTRODUODENOSCOPY (EGD) WITH PROPOFOL ;  Surgeon: Wilhelmenia Aloha Raddle., MD;  Location: [facility] ENDOSCOPY;  Service: Gastroenterology;  Laterality: N/A;  AXIOS STENT   ESOPHAGOGASTRODUODENOSCOPY (EGD) WITH PROPOFOL  N/A 05/22/2022   Procedure: ESOPHAGOGASTRODUODENOSCOPY (EGD) WITH PROPOFOL ;  Surgeon: Wilhelmenia Aloha Raddle., MD;  Location[facility];  Service: Gastroenterology;  Laterality: N/A;   ESOPHAGOGASTRODUODENOSCOPY (EGD) WITH PROPOFOL  N/A 06/16/2022   Procedure: ESOPHAGOGASTRODUODENOSCOPY (EGD) WITH PROPOFOL ;  Surgeon: Wilhelmenia Aloha Raddle., MD;  Location: [facility];  Service: Gastroenterology;  Laterality: N/A;   ESOPHAGOGASTRODUODENOSCOPY (EGD) WITH PROPOFOL  N/A 07/30/2022   Procedure:  ESOPHAGOGASTRODUODENOSCOPY (EGD) WITH PROPOFOL ;  Surgeon: Wilhelmenia Aloha Raddle., MD;  Location: [facility]OSCOPY;  Service: Gastroenterology;  Laterality: N/A;   ESOPHAGOGASTRODUODENOSCOPY (EGD) WITH PROPOFOL  N/A 03/30/2023   Procedure: ESOPHAGOGASTRODUODENOSCOPY (EGD) WITH PROPOFOL ;  Surgeon: Avram Lupita BRAVO, MD;  Location: [facility]OSCOPY;  Service: Gastroenterology;  Laterality: N/A;   EUS  11/14/2020   Procedure: UPPER ENDOSCOPIC ULTRASOUND (EUS) LINEAR;  Surgeon: Wilhelmenia Aloha Raddle., MD;  Location: [facility] ENDOSCOPY;  Service: Gastroenterology;;   EUS N/A 06/26/2021   Procedure: UPPER ENDOSCOPIC ULTRASOUND (EUS) RADIAL;  Surgeon: Wilhelmenia Aloha Raddle., MD;  Location: [facility] ENDOSCOPY;  Service: Gastroenterology;  Laterality: N/A;   EUS N/A 05/22/2022   Procedure: UPPER ENDOSCOPIC ULTRASOUND (EUS) LINEAR;  Surgeon: Wilhelmenia Aloha Raddle., MD;  Location: [facility]COPY;  Service: Gastroenterology;  Laterality: N/A;   EUS N/A 06/16/2022   Procedure: UPPER ENDOSCOPIC ULTRASOUND (EUS) LINEAR;  Surgeon: Wilhelmenia Aloha Raddle., MD;  Location: [facility] ENDOSCOPY;  Service: Gastroenterology;  Laterality: N/A;   FINE NEEDLE ASPIRATION  05/22/2022   Procedure: FINE NEEDLE ASPIRATION;  Surgeon: Wilhelmenia Aloha Raddle., MD;  Location: [facility];  Service: Gastroenterology;;   GASTROINTESTINAL STENT REMOVAL  12/20/2020   Procedure: GASTROINTESTINAL STENT REMOVAL;  Surgeon: Wilhelmenia Aloha Raddle., MD;  Location: [facility] ENDOSCOPY;  Service: Gastroenterology;;  cyst gastrostomy stent and double pig tail stents x2 removed   HERNIA REPAIR Left 2002   groin   INGUINAL HERNIA REPAIR Left 04/05/2017   Procedure: RECURRENT HERNIA REPAIR INGUINAL ADULT WITH MESH;  Surgeon: Mavis Anes, MD;  Location: [facility];  Service: General;  Laterality: Left;   MASS EXCISION Right 04/29/2020   Procedure: EXCISION MASS RIGHT WRIST;  Surgeon: Murrell Drivers, MD;  Location: [facility];  Service: Orthopedics;  Laterality: Right;    MASS EXCISION Right 10/09/2020   Procedure: EXCISION MASS, ABDOMINAL WALL, 2CM;  Surgeon: Kallie Manuelita BROCKS, MD;  Location: [facility];  Service: General;  Laterality: Right;   PANCREATIC STENT PLACEMENT  06/26/2021  Procedure: AXIOS STENT PLACEMENT;  Surgeon: Wilhelmenia Aloha Raddle., MD;  Location: [facility] ENDOSCOPY;  Service: Gastroenterology;;   PANCREATIC STENT PLACEMENT  06/16/2022   Procedure: PANCREATIC STENT PLACEMENT;  Surgeon: Wilhelmenia Aloha Raddle., MD;  Location: [facility];  Service: Gastroenterology;;   REMOVAL OF STONES  12/20/2020   Procedure: REMOVAL OF STONES;  Surgeon: Wilhelmenia Aloha Raddle., MD;  Location: [facility];  Service: Gastroenterology;;   REMOVAL OF STONES  07/24/2021   Procedure: REMOVAL OF STONES;  Surgeon: Wilhelmenia Aloha Raddle., MD;  Location: [facility] ENDOSCOPY;  Service: Gastroenterology;;   REMOVAL OF STONES  05/22/2022   Procedure: REMOVAL OF STONES;  Surgeon: Wilhelmenia Aloha Raddle., MD;  Location: [facility];  Service: Gastroenterology;;   ANNETT N/A 10/10/2020   Procedure: ANNETT;  Surgeon: Golda Claudis PENNER, MD;  Location: [facility];  Service: Endoscopy;  Laterality: N/A;   SPHINCTEROTOMY  07/24/2021   Procedure: SPHINCTEROTOMY;  Surgeon: Mansouraty, Aloha Raddle., MD;  Location: [facility] ENDOSCOPY;  Service: Gastroenterology;;   SPLENECTOMY, TOTAL N/A 11/17/2022   Procedure: SPLENECTOMY;  Surgeon: Dasie Leonor CROME, MD;  Location: [facility] OR;  Service: General;  Laterality: N/A;   SPYGLASS CHOLANGIOSCOPY N/A 12/20/2020   Procedure: DEBHOJDD CHOLANGIOSCOPY;  Surgeon: Wilhelmenia Aloha Raddle., MD;  Location: [facility]OSCOPY;  Service: Gastroenterology;  Laterality: N/A;   SPYGLASS CHOLANGIOSCOPY N/A 07/24/2021   Procedure: SPYGLASS CHOLANGIOSCOPY;  Surgeon: Wilhelmenia Aloha Raddle., MD;  Location: [facility] ENDOSCOPY;  Service: Gastroenterology;  Laterality: N/A;   STENT REMOVAL  11/09/2020   Procedure: STENT REMOVAL;  Surgeon: Wilhelmenia Aloha Raddle., MD;  Location: [facility]  ENDOSCOPY;  Service: Gastroenterology;;   CLEDA REMOVAL  11/11/2020   Procedure: STENT REMOVAL;  Surgeon: Wilhelmenia Aloha Raddle., MD;  Location: [facility] ENDOSCOPY;  Service: Gastroenterology;;   CLEDA REMOVAL  11/14/2020   Procedure: STENT REMOVAL;  Surgeon: Wilhelmenia Aloha Raddle., MD;  Location: [facility] ENDOSCOPY;  Service: Gastroenterology;;   CLEDA REMOVAL  12/20/2020   Procedure: STENT REMOVAL;  Surgeon: Wilhelmenia Aloha Raddle., MD;  Location: [facility];  Service: Gastroenterology;;  biliary x2   STENT REMOVAL  07/24/2021   Procedure: AXIOS STENT REMOVAL;  Surgeon: Wilhelmenia Aloha Raddle., MD;  Location: [facility] ENDOSCOPY;  Service: Gastroenterology;;   CLEDA REMOVAL  07/30/2022   Procedure: STENT REMOVAL;  Surgeon: Wilhelmenia Aloha Raddle., MD;  Location: [facility]OSCOPY;  Service: Gastroenterology;;   UPPER ESOPHAGEAL ENDOSCOPIC ULTRASOUND (EUS) N/A 11/07/2020   Procedure: UPPER ESOPHAGEAL ENDOSCOPIC ULTRASOUND (EUS);  Surgeon: Wilhelmenia Aloha Raddle., MD;  Location: [facility] ENDOSCOPY;  Service: Gastroenterology;  Laterality: N/A;   UPPER GASTROINTESTINAL ENDOSCOPY     WOUND DEBRIDEMENT  11/09/2020   Procedure: CYST NECROSECTOMY;  Surgeon: Wilhelmenia Aloha Raddle., MD;  Location: [facility] ENDOSCOPY;  Service: Gastroenterology;;     A IV Location/Drains/Wounds Patient Lines/Drains/Airways Status     Active Line/Drains/Airways     Name Placement date Placement time Site Days   Peripheral IV 07/07/23 20 G 1 Right Antecubital 07/07/23  1308  Antecubital  1   Peripheral IV 07/07/23 20 G Anterior;Left Forearm 07/07/23  1630  Forearm  1   Closed System Drain 1 Right;Lateral RLQ Bulb (JP) 10/09/20  0855  RLQ  1002   GI Stent 10 Fr. 10/10/20  1540  --  1001   GI Stent 7 Fr. 11/07/20  1456  --  973   GI Stent 11/07/20  1457  --  973   GI Stent 8.5 Fr. 11/09/20  1027  --  971   GI Stent 10 Fr. 11/14/20  1237  --  966   GI Stent  01/29/21  1311  --  890   GI Stent  06/26/21  0840  --  742   GI Stent 7 Fr. 06/26/21   0840  --  742   GI Stent 10 Fr. 07/24/21  1100  --  714   Incision - 4 Ports Umbilicus Medial;Superior Right;Lateral Right;Lateral;Distal 10/09/20  0751  -- 1002            Intake/Output Last 24 hours  Intake/Output Summary (Last 24 hours) at 07/08/2023 0519 Last data filed at 07/08/2023 0518 Gross per 24 hour  Intake 4956.98 ml  Output 1050 ml  Net 3906.98 ml    Labs/Imaging Results for orders placed or performed during the hospital encounter of 07/07/23 (from the past 48 hours)  POC CBG, ED     Status: Abnormal   Collection Time: 07/07/23  1:04 PM  Result Value Ref Range   Glucose-Capillary >600 (HH) 70 - 99 mg/dL    Comment: Glucose reference range applies only to samples taken after fasting for at least 8 hours.  Lipase, blood     Status: Abnormal   Collection Time: 07/07/23  1:10 PM  Result Value Ref Range   Lipase 97 (H) 11 - 51 U/L    Comment: Performed at [facility], [address], [place], [place] [address]  Comprehensive metabolic panel     Status: Abnormal   Collection Time: 07/07/23  1:10 PM  Result Value Ref Range   Sodium 126 (L) 135 - 145 mmol/L    Comment: ELECTROLYTES REPEATED TO VERIFY   Potassium 4.1 3.5 - 5.1 mmol/L   Chloride 84 (L) 98 - 111 mmol/L    Comment: ELECTROLYTES REPEATED TO VERIFY   CO2 21 (L) 22 - 32 mmol/L    Comment: ELECTROLYTES REPEATED TO VERIFY   Glucose, Bld 739 (HH) 70 - 99 mg/dL    Comment: CRITICAL RESULT CALLED TO, READ BACK BY AND VERIFIED WITH EVANS,I. RN AT 1434 07/07/23 MULLINS,T Glucose reference range applies only to samples taken after fasting for at least 8 hours.    BUN 18 6 - 20 mg/dL   Creatinine, Ser 8.98 0.61 - 1.24 mg/dL   Calcium  9.3 8.9 - 10.3 mg/dL   Total Protein 7.8 6.5 - 8.1 g/dL   Albumin  3.8 3.5 - 5.0 g/dL   AST 54 (H) 15 - 41 U/L   ALT 169 (H) 0 - 44 U/L   Alkaline Phosphatase 1,391 (H) 38 - 126 U/L   Total Bilirubin 1.2 0.0 - 1.2 mg/dL   GFR, Estimated >39 >39 mL/min     Comment: (NOTE) Calculated using the CKD-EPI Creatinine Equation (2021)    Anion gap 21 (H) 5 - 15    Comment: ELECTROLYTES REPEATED TO VERIFY Performed at [facility], [address] [place], [place] [address]   Urinalysis, Routine w reflex microscopic -Urine, Clean Catch     Status: Abnormal   Collection Time: 07/07/23  1:10 PM  Result Value Ref Range   Color, Urine STRAW (A) YELLOW   APPearance CLEAR CLEAR   Specific Gravity, Urine 1.028 1.005 - 1.030   pH 5.0 5.0 - 8.0   Glucose, UA >=500 (A) NEGATIVE mg/dL   Hgb urine dipstick NEGATIVE NEGATIVE   Bilirubin Urine NEGATIVE NEGATIVE   Ketones, ur 20 (A) NEGATIVE mg/dL   Protein, ur NEGATIVE NEGATIVE mg/dL   Nitrite NEGATIVE NEGATIVE   Leukocytes,Ua NEGATIVE NEGATIVE   RBC / HPF  0-5 0 - 5 RBC/hpf   WBC, UA 0-5 0 - 5 WBC/hpf   Bacteria, UA NONE SEEN NONE SEEN   Squamous Epithelial / HPF 0-5 0 - 5 /HPF   Mucus PRESENT     Comment: Performed at [facility], [address], [place], [place] [address]  CBC with Differential     Status: Abnormal   Collection Time: 07/07/23  1:10 PM  Result Value Ref Range   WBC 16.1 (H) 4.0 - 10.5 K/uL   RBC 5.05 4.22 - 5.81 MIL/uL   Hemoglobin 15.7 13.0 - 17.0 g/dL   HCT 53.3 60.9 - 47.9 %   MCV 92.3 80.0 - 100.0 fL   MCH 31.1 26.0 - 34.0 pg   MCHC 33.7 30.0 - 36.0 g/dL   RDW 86.0 88.4 - 84.4 %   Platelets 512 (H) 150 - 400 K/uL   nRBC 0.0 0.0 - 0.2 %   Neutrophils Relative % 86 %   Neutro Abs 13.8 (H) 1.7 - 7.7 K/uL   Lymphocytes Relative 8 %   Lymphs Abs 1.3 0.7 - 4.0 K/uL   Monocytes Relative 4 %   Monocytes Absolute 0.7 0.1 - 1.0 K/uL   Eosinophils Relative 0 %   Eosinophils Absolute 0.0 0.0 - 0.5 K/uL   Basophils Relative 1 %   Basophils Absolute 0.1 0.0 - 0.1 K/uL   Immature Granulocytes 1 %   Abs Immature Granulocytes 0.17 (H) 0.00 - 0.07 K/uL    Comment: Performed at [facility], [address] [place], [place]  [address]  Beta-hydroxybutyric acid     Status: Abnormal   Collection Time: 07/07/23  1:10 PM  Result Value Ref Range   Beta-Hydroxybutyric Acid 6.38 (H) 0.05 - 0.27 mmol/L    Comment: RESULT CONFIRMED BY MANUAL DILUTION Performed at [facility], [address] [place], [place] [address]   Blood gas, venous (at [facility] and [facility])     Status: Abnormal   Collection Time: 07/07/23  1:10 PM  Result Value Ref Range   pH, Ven 7.31 7.25 - 7.43   pCO2, Ven 42 (L) 44 - 60 mmHg   pO2, Ven 33 32 - 45 mmHg   Bicarbonate 21.1 20.0 - 28.0 mmol/L   Acid-base deficit 5.0 (H) 0.0 - 2.0 mmol/L   O2 Saturation 61.3 %   Patient temperature 37.0     Comment: Performed at [facility], [address], [place], [place] [address]  POC CBG, ED     Status: Abnormal   Collection Time: 07/07/23  1:55 PM  Result Value Ref Range   Glucose-Capillary >600 (HH) 70 - 99 mg/dL    Comment: Glucose reference range applies only to samples taken after fasting for at least 8 hours.  CBG monitoring, ED     Status: Abnormal   Collection Time: 07/07/23  2:49 PM  Result Value Ref Range   Glucose-Capillary 489 (H) 70 - 99 mg/dL    Comment: Glucose reference range applies only to samples taken after fasting for at least 8 hours.  CBG monitoring, ED     Status: Abnormal   Collection Time: 07/07/23  4:24 PM  Result Value Ref Range   Glucose-Capillary 431 (H) 70 - 99 mg/dL    Comment: Glucose reference range applies only to samples taken after fasting for at least 8 hours.  Osmolality     Status: Abnormal   Collection Time: 07/07/23  4:25 PM  Result Value  Ref Range   Osmolality 313 (H) 275 - 295 mOsm/kg    Comment: Performed at [facility], [address], [place], [place] [address]  I-Stat CG4 Lactic Acid     Status: None   Collection Time: 07/07/23  4:39 PM  Result Value Ref Range   Lactic Acid, Venous 1.2 0.5 - 1.9 mmol/L  CBG monitoring, ED     Status: Abnormal   Collection Time:  07/07/23  5:02 PM  Result Value Ref Range   Glucose-Capillary 408 (H) 70 - 99 mg/dL    Comment: Glucose reference range applies only to samples taken after fasting for at least 8 hours.  CBG monitoring, ED     Status: Abnormal   Collection Time: 07/07/23  5:46 PM  Result Value Ref Range   Glucose-Capillary 327 (H) 70 - 99 mg/dL    Comment: Glucose reference range applies only to samples taken after fasting for at least 8 hours.  CBG monitoring, ED     Status: Abnormal   Collection Time: 07/07/23  6:54 PM  Result Value Ref Range   Glucose-Capillary 319 (H) 70 - 99 mg/dL    Comment: Glucose reference range applies only to samples taken after fasting for at least 8 hours.  CBG monitoring, ED     Status: Abnormal   Collection Time: 07/07/23  8:00 PM  Result Value Ref Range   Glucose-Capillary 202 (H) 70 - 99 mg/dL    Comment: Glucose reference range applies only to samples taken after fasting for at least 8 hours.  CBG monitoring, ED     Status: Abnormal   Collection Time: 07/07/23  9:01 PM  Result Value Ref Range   Glucose-Capillary 205 (H) 70 - 99 mg/dL    Comment: Glucose reference range applies only to samples taken after fasting for at least 8 hours.  Basic metabolic panel     Status: Abnormal   Collection Time: 07/07/23  9:09 PM  Result Value Ref Range   Sodium 133 (L) 135 - 145 mmol/L    Comment: DELTA CHECK NOTED   Potassium 3.7 3.5 - 5.1 mmol/L   Chloride 100 98 - 111 mmol/L   CO2 27 22 - 32 mmol/L   Glucose, Bld 181 (H) 70 - 99 mg/dL    Comment: Glucose reference range applies only to samples taken after fasting for at least 8 hours.   BUN 13 6 - 20 mg/dL   Creatinine, Ser 9.49 (L) 0.61 - 1.24 mg/dL   Calcium  8.5 (L) 8.9 - 10.3 mg/dL   GFR, Estimated >39 >39 mL/min    Comment: (NOTE) Calculated using the CKD-EPI Creatinine Equation (2021)    Anion gap 6 5 - 15    Comment: Performed at [facility], [address] [place], [place] [address]   Beta-hydroxybutyric acid     Status: None   Collection Time: 07/07/23  9:09 PM  Result Value Ref Range   Beta-Hydroxybutyric Acid 0.27 0.05 - 0.27 mmol/L    Comment: Performed at [facility], [address], [place], [place] [address]  CBG monitoring, ED     Status: Abnormal   Collection Time: 07/07/23 10:18 PM  Result Value Ref Range   Glucose-Capillary 218 (H) 70 - 99 mg/dL    Comment: Glucose reference range applies only to samples taken after fasting for at least 8 hours.  CBG monitoring, ED     Status: Abnormal   Collection Time: 07/07/23 11:36 PM  Result  Value Ref Range   Glucose-Capillary 169 (H) 70 - 99 mg/dL    Comment: Glucose reference range applies only to samples taken after fasting for at least 8 hours.  CBG monitoring, ED     Status: Abnormal   Collection Time: 07/08/23 12:53 AM  Result Value Ref Range   Glucose-Capillary 169 (H) 70 - 99 mg/dL    Comment: Glucose reference range applies only to samples taken after fasting for at least 8 hours.  CBG monitoring, ED     Status: Abnormal   Collection Time: 07/08/23  1:01 AM  Result Value Ref Range   Glucose-Capillary 156 (H) 70 - 99 mg/dL    Comment: Glucose reference range applies only to samples taken after fasting for at least 8 hours.  CBG monitoring, ED     Status: Abnormal   Collection Time: 07/08/23  1:54 AM  Result Value Ref Range   Glucose-Capillary 155 (H) 70 - 99 mg/dL    Comment: Glucose reference range applies only to samples taken after fasting for at least 8 hours.  CBG monitoring, ED     Status: Abnormal   Collection Time: 07/08/23  3:53 AM  Result Value Ref Range   Glucose-Capillary 127 (H) 70 - 99 mg/dL    Comment: Glucose reference range applies only to samples taken after fasting for at least 8 hours.  Basic metabolic panel     Status: Abnormal   Collection Time: 07/08/23  4:04 AM  Result Value Ref Range   Sodium 136 135 - 145 mmol/L   Potassium 3.2 (L) 3.5 - 5.1 mmol/L    Chloride 101 98 - 111 mmol/L   CO2 28 22 - 32 mmol/L   Glucose, Bld 111 (H) 70 - 99 mg/dL    Comment: Glucose reference range applies only to samples taken after fasting for at least 8 hours.   BUN 14 6 - 20 mg/dL   Creatinine, Ser 9.39 (L) 0.61 - 1.24 mg/dL   Calcium  8.5 (L) 8.9 - 10.3 mg/dL   GFR, Estimated >39 >39 mL/min    Comment: (NOTE) Calculated using the CKD-EPI Creatinine Equation (2021)    Anion gap 7 5 - 15    Comment: Performed at [facility], [address], [place], [place] [address]  Beta-hydroxybutyric acid     Status: None   Collection Time: 07/08/23  4:04 AM  Result Value Ref Range   Beta-Hydroxybutyric Acid 0.15 0.05 - 0.27 mmol/L    Comment: Performed at [facility], [address], [place], [place] [address]  Hepatic function panel     Status: Abnormal   Collection Time: 07/08/23  4:04 AM  Result Value Ref Range   Total Protein 5.6 (L) 6.5 - 8.1 g/dL   Albumin  2.8 (L) 3.5 - 5.0 g/dL   AST 25 15 - 41 U/L   ALT 85 (H) 0 - 44 U/L   Alkaline Phosphatase 899 (H) 38 - 126 U/L   Total Bilirubin 0.5 0.0 - 1.2 mg/dL   Bilirubin, Direct 0.1 0.0 - 0.2 mg/dL   Indirect Bilirubin 0.4 0.3 - 0.9 mg/dL    Comment: Performed at [facility], [address] [place], [place] [address]  CBG monitoring, ED     Status: Abnormal   Collection Time: 07/08/23  5:16 AM  Result Value Ref Range   Glucose-Capillary 153 (H) 70 - 99 mg/dL    Comment: Glucose reference range applies only to samples taken after fasting for  at least 8 hours.   CT ABDOMEN PELVIS W CONTRAST Result Date: 07/07/2023 CLINICAL DATA:  Acute abdominal pain EXAM: CT ABDOMEN AND PELVIS WITH CONTRAST TECHNIQUE: Multidetector CT imaging of the abdomen and pelvis was performed using the standard protocol following bolus administration of intravenous contrast. RADIATION DOSE REDUCTION: This exam was performed according to the departmental dose-optimization program  which includes automated exposure control, adjustment of the mA and/or kV according to patient size and/or use of iterative reconstruction technique. CONTRAST:  OMNIPAQUE  IOHEXOL  300 MG/ML  SOLN COMPARISON:  04/27/2023 CT, MRI from 05/24/2023 FINDINGS: Lower chest: No acute abnormality. Hepatobiliary: Gallbladder has been surgically removed. Biliary ductal dilatation is noted related to the post cholecystectomy state. The liver is within normal limits. 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 Is residual pancreas is within normal limits. A cystic lesion is noted adjacent to the distal aspect of the residual pancreas. This measures 4.4 cm in greatest dimension slightly increased in size when compared with the prior exam. A smaller cystic lesion is noted measuring 18 mm in greatest dimension. These changes are stable from the prior MRI. No significant inflammatory changes are identified to suggest pancreatitis. Spleen: Surgically removed. Adrenals/Urinary Tract: Adrenal glands are within normal limits. Kidneys are well visualized bilaterally. No renal calculi or obstructive changes are noted. The ureters are within normal limits. The bladder is well distended. Stomach/Bowel: Fecal material is noted scattered throughout the colon without obstructive change. No inflammatory changes are noted. The appendix is within normal limits. Small bowel and stomach are within normal limits. Vascular/Lymphatic: No significant vascular findings are present. No enlarged abdominal or pelvic lymph nodes. Reproductive: Prostate is unremarkable. Other: No abdominal wall hernia or abnormality. No abdominopelvic ascites. Musculoskeletal: No acute or significant osseous findings. 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 The largest of these now measures 4.4 cm increased in size from September of 2024. No findings to suggest acute pancreatitis are noted. No acute abnormality  noted. Electronically Signed   By: Oneil Devonshire M.D.   On: 07/07/2023 17:29   MR THORACIC SPINE W WO CONTRAST Result Date: 07/07/2023 CLINICAL DATA:  Mid-back pain; Low back pain, cauda equina syndrome suspected. EXAM: MRI THORACIC AND LUMBAR SPINE WITHOUT AND WITH CONTRAST TECHNIQUE: Multiplanar and multiecho pulse sequences of the thoracic and lumbar spine were obtained without and with intravenous contrast. CONTRAST:  7mL GADAVIST  GADOBUTROL  1 MMOL/ML IV SOLN COMPARISON:  None Available. FINDINGS: MRI THORACIC SPINE FINDINGS Alignment:  Normal. Vertebrae: Tiny focus of enhancement in the posterior aspect of the T5 vertebral body along the inferior endplate (sagittal image 11 series 23), indeterminate. Otherwise normal vertebral body heights. Cord: Normal spinal cord signal and volume. No abnormal enhancement. Paraspinal and other soft tissues: Unremarkable. Disc levels: No significant disc herniation, spinal canal stenosis or neural foraminal narrowing in the thoracic spine. Mild right-greater-than-left facet arthropathy at T10-11. MRI LUMBAR SPINE FINDINGS Segmentation: Counting was performed from the cervicothoracic localizer sequence. 13 rib-bearing vertebral bodies. The lowest rib-bearing vertebral body is labeled L1. The lowest fully formed disc space is labeled L6-S1. Alignment:  Normal. Vertebrae: Severe disc height loss at L4-5 with Modic type 1 degenerative endplate marrow signal changes along the posterior aspect of the endplates. No evidence of acute fracture or suspicious marrow lesions. Conus medullaris: Extends to the L2 level and appears normal. No abnormal enhancement of the conus or cauda equina nerve roots. Paraspinal and other soft tissues: Unremarkable. Disc levels: T12-L1:  Normal. L1-L2:  Normal. L2-L3:  Normal. L3-L4: Disc bulge with superimposed small central disc extrusion with slight cranial migration. No spinal canal stenosis. Severe disc height loss contributes to moderate bilateral  neural foraminal narrowing. L4-L5:  Mild bilateral facet arthropathy. L5-6: Normal. L6-S1: Small central disc protrusion. No spinal canal stenosis or neural foraminal narrowing. Mild right facet arthropathy. IMPRESSION: 1. No significant disc herniation, spinal canal stenosis or neural foraminal narrowing in the thoracic spine. 2. Tiny focus of enhancement in the posterior aspect of the T5 vertebral body along the inferior endplate, indeterminate. In the absence of known primary malignancy, this could reflect Modic type 1 degenerative endplate changes or a small degenerative Schmorl's node. Consider follow-up MRI in 2-3 months to evaluate stability. 3. Severe disc height loss at L3-4 with moderate bilateral neural foraminal narrowing. 4. Severe disc height loss at L4-5 with Modic type 1 degenerative endplate marrow signal changes along the posterior aspect of the endplates. Electronically Signed   By: Ryan Chess M.D.   On: 07/07/2023 17:20   MR Lumbar Spine W Wo Contrast Result Date: 07/07/2023 CLINICAL DATA:  Mid-back pain; Low back pain, cauda equina syndrome suspected. EXAM: MRI THORACIC AND LUMBAR SPINE WITHOUT AND WITH CONTRAST TECHNIQUE: Multiplanar and multiecho pulse sequences of the thoracic and lumbar spine were obtained without and with intravenous contrast. CONTRAST:  7mL GADAVIST  GADOBUTROL  1 MMOL/ML IV SOLN COMPARISON:  None Available. FINDINGS: MRI THORACIC SPINE FINDINGS Alignment:  Normal. Vertebrae: Tiny focus of enhancement in the posterior aspect of the T5 vertebral body along the inferior endplate (sagittal image 11 series 23), indeterminate. Otherwise normal vertebral body heights. Cord: Normal spinal cord signal and volume. No abnormal enhancement. Paraspinal and other soft tissues: Unremarkable. Disc levels: No significant disc herniation, spinal canal stenosis or neural foraminal narrowing in the thoracic spine. Mild right-greater-than-left facet arthropathy at T10-11. MRI LUMBAR  SPINE FINDINGS Segmentation: Counting was performed from the cervicothoracic localizer sequence. 13 rib-bearing vertebral bodies. The lowest rib-bearing vertebral body is labeled L1. The lowest fully formed disc space is labeled L6-S1. Alignment:  Normal. Vertebrae: Severe disc height loss at L4-5 with Modic type 1 degenerative endplate marrow signal changes along the posterior aspect of the endplates. No evidence of acute fracture or suspicious marrow lesions. Conus medullaris: Extends to the L2 level and appears normal. No abnormal enhancement of the conus or cauda equina nerve roots. Paraspinal and other soft tissues: Unremarkable. Disc levels: T12-L1:  Normal. L1-L2:  Normal. L2-L3:  Normal. L3-L4: Disc bulge with superimposed small central disc extrusion with slight cranial migration. No spinal canal stenosis. Severe disc height loss contributes to moderate bilateral neural foraminal narrowing. L4-L5:  Mild bilateral facet arthropathy. L5-6: Normal. L6-S1: Small central disc protrusion. No spinal canal stenosis or neural foraminal narrowing. Mild right facet arthropathy. IMPRESSION: 1. No significant disc herniation, spinal canal stenosis or neural foraminal narrowing in the thoracic spine. 2. Tiny focus of enhancement in the posterior aspect of the T5 vertebral body along the inferior endplate, indeterminate. In the absence of known primary malignancy, this could reflect Modic type 1 degenerative endplate changes or a small degenerative Schmorl's node. Consider follow-up MRI in 2-3 months to evaluate stability. 3. Severe disc height loss at L3-4 with moderate bilateral neural foraminal narrowing. 4. Severe disc height loss at L4-5 with Modic type 1 degenerative endplate marrow signal changes along the posterior aspect of the endplates. Electronically Signed   By: Ryan Chess M.D.   On: 07/07/2023 17:20    Pending Labs Unresulted Labs (From admission,  onward)     Start     Ordered   07/08/23 0440   HIV Antibody (routine testing w rflx)  Once,   R        07/08/23 0440   07/07/23 1922  Beta-hydroxybutyric acid  (Diabetes Ketoacidosis (DKA))  Now then every 8 hours,   R (with TIMED occurrences)      07/07/23 1922            Vitals/Pain Today's Vitals   07/08/23 0400 07/08/23 0437 07/08/23 0518 07/08/23 0518  BP: (!) 128/92     Pulse: (!) 50     Resp: 12     Temp:    97.7 F (36.5 C)  TempSrc:    Oral  SpO2: 98%     Weight:      Height:      PainSc:  6  3      Isolation Precautions No active isolations  Medications Medications  insulin  regular, human (MYXREDLIN ) 100 units/ 100 mL infusion (1.8 Units/hr Intravenous Infusion Verify 07/08/23 0518)  lactated ringers  infusion (0 mLs Intravenous Stopped 07/07/23 2006)  dextrose  5 % in lactated ringers  infusion ( Intravenous Infusion Verify 07/08/23 0518)  dextrose  50 % solution 0-50 mL (has no administration in time range)  enoxaparin  (LOVENOX ) injection 40 mg (40 mg Subcutaneous Given 07/07/23 2222)  ondansetron  (ZOFRAN ) injection 4 mg (has no administration in time range)  morphine  (PF) 2 MG/ML injection 2 mg (2 mg Intravenous Given 07/07/23 2341)  senna-docusate (Senokot-S) tablet 1 tablet (has no administration in time range)  oxyCODONE  (Oxy IR/ROXICODONE ) immediate release tablet 10 mg (10 mg Oral Given 07/08/23 0436)  insulin  glargine-yfgn (SEMGLEE ) injection 40 Units (40 Units Subcutaneous Given 07/08/23 0432)  insulin  aspart (novoLOG ) injection 0-15 Units (has no administration in time range)  insulin  aspart (novoLOG ) injection 0-5 Units (has no administration in time range)  sodium chloride  0.9 % bolus 1,000 mL (0 mLs Intravenous Stopped 07/07/23 1343)  LORazepam  (ATIVAN ) injection 1 mg (1 mg Intravenous Given 07/07/23 1513)  lactated ringers  bolus 1,434 mL (0 mLs Intravenous Stopped 07/07/23 1723)  lactated ringers  bolus 1,000 mL (0 mLs Intravenous Stopped 07/07/23 1723)  lactated ringers  bolus 1,434 mL (0 mLs Intravenous Stopped  07/07/23 1856)  potassium chloride  10 mEq in 100 mL IVPB (0 mEq Intravenous Stopped 07/07/23 1824)  gadobutrol  (GADAVIST ) 1 MMOL/ML injection 7 mL (7 mLs Intravenous Contrast Given 07/07/23 1612)  iohexol  (OMNIPAQUE ) 300 MG/ML solution 100 mL (100 mLs Intravenous Contrast Given 07/07/23 1643)  fentaNYL  (SUBLIMAZE ) injection 50 mcg (50 mcg Intravenous Given 07/07/23 1749)    Mobility walks     Focused Assessments See chart   R Recommendations: See Admitting Provider Note  Report given to:   Additional Notes: see chart

## 2023-07-08 NOTE — Discharge Instructions (Signed)
 Advised to follow-up with endocrinology as scheduled. Advised to take Semglee 62 units daily and NovoLog 16 units 3 times daily with meals.

## 2023-07-08 NOTE — ED Notes (Signed)
 Pt placed on 2L [place] due to his oxygen saturation decreasing to 84% RA while sleeping. Pt's oxygen saturation 97% at this time. Pt in position of comfort on locked stretcher with call bell in reach.

## 2023-07-08 NOTE — ED Notes (Signed)
 Pt provided snack & diet ginger ale per providers orders.

## 2023-07-08 NOTE — Progress Notes (Signed)
 Patient discharged home, IV removed, discharge paperwork provided and explained, patient verbalized understanding.

## 2023-07-08 NOTE — Discharge Summary (Signed)
 Physician Discharge Summary  Steve Romero FMW:[id number] DOB: Sep 24, 1970 DOA: 07/07/2023  PCP: Jolinda Norene HERO, DO  Admit date: 07/07/2023  Discharge date: 07/08/2023  Admitted From: Home  Disposition:  Home  Recommendations for Outpatient Follow-up:  Follow up with PCP in 1-2 weeks. Please obtain BMP/CBC in one week Advised to follow-up with endocrinology as scheduled. Advised to take Semglee  62 units daily and NovoLog  16 units 3 times daily with meals.  Home Health:None Equipment/Devices:None  Discharge Condition: Stable CODE STATUS:Full code Diet recommendation:  Carb Modified   Brief Summary/ Hospital Course: This 53 years old male with PMH significant for post ERCP necrotizing pancreatitis with recurrent pseudocyst, status post distal pancreatectomy, chronic abdominal pain, insulin -dependent diabetes, hypertension, hyperlipidemia, depression / anxiety presented in the ED for the evaluation of back pain, polyuria, polydipsia and generalized weakness.  Patient has been using long-acting insulin  60 units every day and 15 units short acting with meals.  He states his glucometer is malfunctioning, and has been giving him unusually high numbers.  Patient is found to have diabetic ketoacidosis on arrival.  Started on insulin  drip and IV hydration as per protocol.  He was successfully transitioned to subcu insulin . DKA resolved.  Diabetic coordinator consulted.  Patient had extensive talk with the diabetic coordinator about the medication,  dosing and follow-ups.  Patient also has made appointment with endocrinologist.  Patient feels better and wants to be discharged. Patient is being discharged home.  Discharge Diagnoses:  Principal Problem:   Diabetic ketoacidosis associated with diabetes mellitus due to underlying condition [facility]) Active Problems:   Hyperlipidemia   Chronic biliary pancreatitis [facility])  Discharge Instructions  Discharge Instructions     Call MD for:  difficulty  breathing, headache or visual disturbances   Complete by: As directed    Call MD for:  persistant dizziness or light-headedness   Complete by: As directed    Call MD for:  persistant nausea and vomiting   Complete by: As directed    Diet - low sodium heart healthy   Complete by: As directed    Diet Carb Modified   Complete by: As directed    Discharge instructions   Complete by: As directed    Advised to follow-up with primary care physician in 1 week. Advised to follow-up with endocrinology as scheduled. Advised to take Semglee  62 units daily and NovoLog  16 units 3 times daily with meals.   Increase activity slowly   Complete by: As directed       Allergies as of 07/08/2023       Reactions   Desipramine Nausea Only, Rash        Medication List     TAKE these medications    acetaminophen  500 MG tablet Commonly known as: TYLENOL  Take 2 tablets (1,000 mg total) by mouth every 8 (eight) hours as needed for mild pain.   BD Pen Needle Nano U/F 32G X 4 MM Misc Generic drug: Insulin  Pen Needle UAD to administer insulin    esomeprazole  20 MG capsule Commonly known as: NexIUM  Take 1 capsule (20 mg total) by mouth daily.   FreeStyle Libre 3 Plus Sensor Misc Check BGs continuously. E11.9 Change sensor every 15 days.   FreeStyle [employer] 1 Units by Does not apply route daily. UAD to test BGs daily. Dx E11.9   lipase/protease/amylase 63999 UNITS Cpep capsule Commonly known as: Creon  Take 3 capsules with meals and 1-2  capsule with snacks ( up too 2 snacks ) What changed:  how much to take how to take this when to take this additional instructions   naloxone  4 MG/0.1ML Liqd nasal spray kit Commonly known as: NARCAN  Place 1 spray into the nose once.   NovoLOG  FlexPen 100 UNIT/ML FlexPen Generic drug: insulin  aspart Inject 16 units into the skin twice daily with breakfast and dinner and 5 units with snacks. Maximum total daily dose 50 units. What changed:  additional instructions   Oxycodone  HCl 10 MG Tabs Take 1 tablet (10 mg total) by mouth every 6 (six) hours as needed.   rosuvastatin  10 MG tablet Commonly known as: CRESTOR  Take 1 tablet (10 mg total) by mouth at bedtime. If you experience muscle aches take every other day   Semglee  (yfgn) 100 UNIT/ML Pen Generic drug: insulin  glargine-yfgn Inject 62 Units into the skin at bedtime. Take 48 units nightly. What changed: how much to take        Follow-up Information     Jolinda Potter M, DO Follow up in 1 week(s).   Specialty: Family Medicine Contact information: [address] [place] [address] [phone]                Allergies  Allergen Reactions   Desipramine Nausea Only and Rash    Consultations: None   Procedures/Studies: CT ABDOMEN PELVIS W CONTRAST Result Date: 07/07/2023 CLINICAL DATA:  Acute abdominal pain EXAM: CT ABDOMEN AND PELVIS WITH CONTRAST TECHNIQUE: Multidetector CT imaging of the abdomen and pelvis was performed using the standard protocol following bolus administration of intravenous contrast. RADIATION DOSE REDUCTION: This exam was performed according to the departmental dose-optimization program which includes automated exposure control, adjustment of the mA and/or kV according to patient size and/or use of iterative reconstruction technique. CONTRAST:  OMNIPAQUE  IOHEXOL  300 MG/ML  SOLN COMPARISON:  04/27/2023 CT, MRI from 05/24/2023 FINDINGS: Lower chest: No acute abnormality. Hepatobiliary: Gallbladder has been surgically removed. Biliary ductal dilatation is noted related to the post cholecystectomy state. The liver is within normal limits. 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 Is residual pancreas is within normal limits. A cystic lesion is noted adjacent to the distal aspect of the residual pancreas. This measures 4.4 cm in greatest dimension slightly increased in size when compared with the prior exam. A smaller  cystic lesion is noted measuring 18 mm in greatest dimension. These changes are stable from the prior MRI. No significant inflammatory changes are identified to suggest pancreatitis. Spleen: Surgically removed. Adrenals/Urinary Tract: Adrenal glands are within normal limits. Kidneys are well visualized bilaterally. No renal calculi or obstructive changes are noted. The ureters are within normal limits. The bladder is well distended. Stomach/Bowel: Fecal material is noted scattered throughout the colon without obstructive change. No inflammatory changes are noted. The appendix is within normal limits. Small bowel and stomach are within normal limits. Vascular/Lymphatic: No significant vascular findings are present. No enlarged abdominal or pelvic lymph nodes. Reproductive: Prostate is unremarkable. Other: No abdominal wall hernia or abnormality. No abdominopelvic ascites. Musculoskeletal: No acute or significant osseous findings. 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 The largest of these now measures 4.4 cm increased in size from September of 2024. No findings to suggest acute pancreatitis are noted. No acute abnormality noted. Electronically Signed   By: Oneil Devonshire M.D.   On: 07/07/2023 17:29   MR THORACIC SPINE W WO CONTRAST Result Date: 07/07/2023 CLINICAL DATA:  Mid-back pain; Low back pain, cauda equina syndrome suspected. EXAM: MRI THORACIC AND LUMBAR SPINE WITHOUT  AND WITH CONTRAST TECHNIQUE: Multiplanar and multiecho pulse sequences of the thoracic and lumbar spine were obtained without and with intravenous contrast. CONTRAST:  7mL GADAVIST  GADOBUTROL  1 MMOL/ML IV SOLN COMPARISON:  None Available. FINDINGS: MRI THORACIC SPINE FINDINGS Alignment:  Normal. Vertebrae: Tiny focus of enhancement in the posterior aspect of the T5 vertebral body along the inferior endplate (sagittal image 11 series 23), indeterminate. Otherwise normal vertebral body heights. Cord:  Normal spinal cord signal and volume. No abnormal enhancement. Paraspinal and other soft tissues: Unremarkable. Disc levels: No significant disc herniation, spinal canal stenosis or neural foraminal narrowing in the thoracic spine. Mild right-greater-than-left facet arthropathy at T10-11. MRI LUMBAR SPINE FINDINGS Segmentation: Counting was performed from the cervicothoracic localizer sequence. 13 rib-bearing vertebral bodies. The lowest rib-bearing vertebral body is labeled L1. The lowest fully formed disc space is labeled L6-S1. Alignment:  Normal. Vertebrae: Severe disc height loss at L4-5 with Modic type 1 degenerative endplate marrow signal changes along the posterior aspect of the endplates. No evidence of acute fracture or suspicious marrow lesions. Conus medullaris: Extends to the L2 level and appears normal. No abnormal enhancement of the conus or cauda equina nerve roots. Paraspinal and other soft tissues: Unremarkable. Disc levels: T12-L1:  Normal. L1-L2:  Normal. L2-L3:  Normal. L3-L4: Disc bulge with superimposed small central disc extrusion with slight cranial migration. No spinal canal stenosis. Severe disc height loss contributes to moderate bilateral neural foraminal narrowing. L4-L5:  Mild bilateral facet arthropathy. L5-6: Normal. L6-S1: Small central disc protrusion. No spinal canal stenosis or neural foraminal narrowing. Mild right facet arthropathy. IMPRESSION: 1. No significant disc herniation, spinal canal stenosis or neural foraminal narrowing in the thoracic spine. 2. Tiny focus of enhancement in the posterior aspect of the T5 vertebral body along the inferior endplate, indeterminate. In the absence of known primary malignancy, this could reflect Modic type 1 degenerative endplate changes or a small degenerative Schmorl's node. Consider follow-up MRI in 2-3 months to evaluate stability. 3. Severe disc height loss at L3-4 with moderate bilateral neural foraminal narrowing. 4. Severe disc  height loss at L4-5 with Modic type 1 degenerative endplate marrow signal changes along the posterior aspect of the endplates. Electronically Signed   By: Ryan Chess M.D.   On: 07/07/2023 17:20   MR Lumbar Spine W Wo Contrast Result Date: 07/07/2023 CLINICAL DATA:  Mid-back pain; Low back pain, cauda equina syndrome suspected. EXAM: MRI THORACIC AND LUMBAR SPINE WITHOUT AND WITH CONTRAST TECHNIQUE: Multiplanar and multiecho pulse sequences of the thoracic and lumbar spine were obtained without and with intravenous contrast. CONTRAST:  7mL GADAVIST  GADOBUTROL  1 MMOL/ML IV SOLN COMPARISON:  None Available. FINDINGS: MRI THORACIC SPINE FINDINGS Alignment:  Normal. Vertebrae: Tiny focus of enhancement in the posterior aspect of the T5 vertebral body along the inferior endplate (sagittal image 11 series 23), indeterminate. Otherwise normal vertebral body heights. Cord: Normal spinal cord signal and volume. No abnormal enhancement. Paraspinal and other soft tissues: Unremarkable. Disc levels: No significant disc herniation, spinal canal stenosis or neural foraminal narrowing in the thoracic spine. Mild right-greater-than-left facet arthropathy at T10-11. MRI LUMBAR SPINE FINDINGS Segmentation: Counting was performed from the cervicothoracic localizer sequence. 13 rib-bearing vertebral bodies. The lowest rib-bearing vertebral body is labeled L1. The lowest fully formed disc space is labeled L6-S1. Alignment:  Normal. Vertebrae: Severe disc height loss at L4-5 with Modic type 1 degenerative endplate marrow signal changes along the posterior aspect of the endplates. No evidence of acute fracture or suspicious marrow lesions.  Conus medullaris: Extends to the L2 level and appears normal. No abnormal enhancement of the conus or cauda equina nerve roots. Paraspinal and other soft tissues: Unremarkable. Disc levels: T12-L1:  Normal. L1-L2:  Normal. L2-L3:  Normal. L3-L4: Disc bulge with superimposed small central disc  extrusion with slight cranial migration. No spinal canal stenosis. Severe disc height loss contributes to moderate bilateral neural foraminal narrowing. L4-L5:  Mild bilateral facet arthropathy. L5-6: Normal. L6-S1: Small central disc protrusion. No spinal canal stenosis or neural foraminal narrowing. Mild right facet arthropathy. IMPRESSION: 1. No significant disc herniation, spinal canal stenosis or neural foraminal narrowing in the thoracic spine. 2. Tiny focus of enhancement in the posterior aspect of the T5 vertebral body along the inferior endplate, indeterminate. In the absence of known primary malignancy, this could reflect Modic type 1 degenerative endplate changes or a small degenerative Schmorl's node. Consider follow-up MRI in 2-3 months to evaluate stability. 3. Severe disc height loss at L3-4 with moderate bilateral neural foraminal narrowing. 4. Severe disc height loss at L4-5 with Modic type 1 degenerative endplate marrow signal changes along the posterior aspect of the endplates. Electronically Signed   By: Ryan Chess M.D.   On: 07/07/2023 17:20    Subjective: Patient was seen and examined at bedside.  Overnight events noted.   Patient reports feeling better, denies any nausea, vomiting, tolerating carb modified diet. Patient wants to be discharged home.   Discharge Exam: Vitals:   07/08/23 0900 07/08/23 1251  BP: 114/61 122/68  Pulse: (!) 59 63  Resp: 17 16  Temp: 97.7 F (36.5 C) 99 F (37.2 C)  SpO2: 100% 98%   Vitals:   07/08/23 0548 07/08/23 0550 07/08/23 0900 07/08/23 1251  BP: 110/75  114/61 122/68  Pulse: (!) 55  (!) 59 63  Resp: 16  17 16   Temp:  98.6 F (37 C) 97.7 F (36.5 C) 99 F (37.2 C)  TempSrc:  Oral Oral Oral  SpO2: 100%  100% 98%  Weight:      Height:        General: Pt is alert, awake, not in acute distress Cardiovascular: RRR, S1/S2 +, no rubs, no gallops Respiratory: CTA bilaterally, no wheezing, no rhonchi Abdominal: Soft, NT, ND,  bowel sounds + Extremities: no edema, no cyanosis    The results of significant diagnostics from this hospitalization (including imaging, microbiology, ancillary and laboratory) are listed below for reference.     Microbiology: No results found for this or any previous visit (from the past 240 hours).   Labs: BNP (last 3 results) No results for input(s): BNP in the last 8760 hours. Basic Metabolic Panel: Recent Labs  Lab 07/07/23 1310 07/07/23 2109 07/08/23 0404  NA 126* 133* 136  K 4.1 3.7 3.2*  CL 84* 100 101  CO2 21* 27 28  GLUCOSE 739* 181* 111*  BUN 18 13 14   CREATININE 1.01 0.50* 0.60*  CALCIUM  9.3 8.5* 8.5*   Liver Function Tests: Recent Labs  Lab 07/07/23 1310 07/08/23 0404  AST 54* 25  ALT 169* 85*  ALKPHOS 1,391* 899*  BILITOT 1.2 0.5  PROT 7.8 5.6*  ALBUMIN  3.8 2.8*   Recent Labs  Lab 07/07/23 1310  LIPASE 97*   No results for input(s): AMMONIA in the last 168 hours. CBC: Recent Labs  Lab 07/07/23 1310  WBC 16.1*  NEUTROABS 13.8*  HGB 15.7  HCT 46.6  MCV 92.3  PLT 512*   Cardiac Enzymes: No results for input(s): CKTOTAL, CKMB,  CKMBINDEX, TROPONINI in the last 168 hours. BNP: Invalid input(s): POCBNP CBG: Recent Labs  Lab 07/08/23 0154 07/08/23 0353 07/08/23 0516 07/08/23 0716 07/08/23 1128  GLUCAP 155* 127* 153* 194* 389*   D-Dimer No results for input(s): DDIMER in the last 72 hours. Hgb A1c No results for input(s): HGBA1C in the last 72 hours. Lipid Profile No results for input(s): CHOL, HDL, LDLCALC, TRIG, CHOLHDL, LDLDIRECT in the last 72 hours. Thyroid  function studies No results for input(s): TSH, T4TOTAL, T3FREE, THYROIDAB in the last 72 hours.  Invalid input(s): FREET3 Anemia work up No results for input(s): VITAMINB12, FOLATE, FERRITIN, TIBC, IRON, RETICCTPCT in the last 72 hours. Urinalysis    Component Value Date/Time   COLORURINE STRAW (A) 07/07/2023 1310    APPEARANCEUR CLEAR 07/07/2023 1310   LABSPEC 1.028 07/07/2023 1310   PHURINE 5.0 07/07/2023 1310   GLUCOSEU >=500 (A) 07/07/2023 1310   HGBUR NEGATIVE 07/07/2023 1310   BILIRUBINUR NEGATIVE 07/07/2023 1310   KETONESUR 20 (A) 07/07/2023 1310   PROTEINUR NEGATIVE 07/07/2023 1310   UROBILINOGEN 0.2 10/24/2014 1136   NITRITE NEGATIVE 07/07/2023 1310   LEUKOCYTESUR NEGATIVE 07/07/2023 1310   Sepsis Labs Recent Labs  Lab 07/07/23 1310  WBC 16.1*   Microbiology No results found for this or any previous visit (from the past 240 hours).   Time coordinating discharge: Over 30 minutes  SIGNED:   Darcel Dawley, MD  [facility] 07/08/2023, 3:11 PM Pager   If 7PM-7AM, please contact night-coverage

## 2023-07-08 NOTE — Inpatient Diabetes Management (Signed)
 Inpatient Diabetes Program Recommendations  AACE/ADA: New Consensus Statement on Inpatient Glycemic Control (2015)  Target Ranges:  Prepandial:   less than 140 mg/dL      Peak postprandial:   less than 180 mg/dL (1-2 hours)      Critically ill patients:  140 - 180 mg/dL   Lab Results  Component Value Date   GLUCAP 194 (H) 07/08/2023   HGBA1C >14.0 (H) 06/08/2023    Review of Glycemic Control  Diabetes history: DM2 Outpatient Diabetes medications: Semglee   60 units at bedtime, Novolog  15 units BID and 5 units with snacks Current orders for Inpatient glycemic control: Transitioning off IV insulin  to Semglee  40 units every day and 0-15 TID with meals and 0-5 HS  HgbA1C - > 14.0%  Inpatient Diabetes Program Recommendations:    Spoke with pt at bedside regarding his diabetes, weight loss And HgbA1C of > 14%. Pt is frustrated that he hasn't been called to set up Endo appt. Pt called [facility] and they said they were reviewing his case and would call back. Pt said he still hasn't received a call. Takes Semglee  60 units at bedtime and Novolog  15 units TID with meals and 5 units for snacks. Blood sugars still reading High. Uses CGM and said his meter broke recently and will need another one. Tries to eat healthy, leaves off sugary beverages and continues to be frustrated with weight loss, which is likely from uncontrolled blood sugars. Gives insulin  in abdomen and rotates sites. Discussed impact of nutrition, exercise, stress, sickness, and medications on diabetes control. Reviewed hypoglycemia s/s and treatment.  For home:  Lantus  62 units at bedtime Novolog  16 units TID  NEEDS ENDOCRINOLOGY CONSULT ASAP.  F/U with PCP, gastro.  Discussed above with RN.  Thank you. Shona Brandy, RD, LDN, CDCES Inpatient Diabetes Coordinator [phone]

## 2023-07-08 NOTE — Evaluation (Signed)
 Physical Therapy Evaluation Patient Details Name: Steve Romero MRN: [id number] DOB: 1970-10-12 Today's Date: 07/08/2023  History of Present Illness  Steve Romero is a 53 y.o. male who presented to the ED 07/07/23  for evaluation of back pain, polyuria, polydipsia, generalized weakness, DKA. PMH:  post ERCP necrotizing pancreatitis with recurrent pseudocysts, s/p distal pancreatectomy, chronic abdominal pain, insulin -dependent diabetes, HTN, HLD, depression/anxiety .MRI thoracic and lumbar spine negative for significant disc herniation, spinal canal stenosis or neuroforaminal narrowing of the thoracic spine.  Indeterminate tiny focus of enhancement in the posterior aspect of T5 vertebral body noted.  Severe disc height loss at L3-4 and L4-5 also seen.  Clinical Impression  The patient ambulated x 300', at times knees slightly dip/buckle. 1 balance loss but able to recover. No further PT indicated. Patient  plans to DC home.  PT signing off.        If plan is discharge home, recommend the following: Assist for transportation   Can travel by private vehicle        Equipment Recommendations None recommended by PT  Recommendations for Other Services       Functional Status Assessment Patient has not had a recent decline in their functional status     Precautions / Restrictions Precautions Precautions: Fall      Mobility  Bed Mobility Overal bed mobility: Needs Assistance                  Transfers Overall transfer level: Independent                      Ambulation/Gait Ambulation/Gait assistance: Supervision Gait Distance (Feet): 300 Feet Assistive device: None Gait Pattern/deviations: Step-through pattern   Gait velocity interpretation: 1.31 - 2.62 ft/sec, indicative of limited community ambulator   General Gait Details: at times  kness slightly flex, patient states  intentionally  Stairs            Wheelchair Mobility     Tilt Bed     Modified Rankin (Stroke Patients Only)       Balance Overall balance assessment: Mild deficits observed, not formally tested                                           Pertinent Vitals/Pain Pain Assessment Pain Assessment: No/denies pain    Home Living Family/patient expects to be discharged to:: Private residence Living Arrangements: Alone Available Help at Discharge: Family;Available PRN/intermittently Type of Home: House Home Access: Stairs to enter   Entrance Stairs-Number of Steps: 1 small threshold step   Home Layout: One level Home Equipment: Cane - single point      Prior Function Prior Level of Function : Independent/Modified Independent             Mobility Comments: Independent without AD       Extremity/Trunk Assessment   Upper Extremity Assessment Upper Extremity Assessment: Overall WFL for tasks assessed    Lower Extremity Assessment Lower Extremity Assessment: Generalized weakness    Cervical / Trunk Assessment Cervical / Trunk Assessment: Normal  Communication   Communication Communication: No apparent difficulties  Cognition Arousal: Alert Behavior During Therapy: WFL for tasks assessed/performed Overall Cognitive Status: Within Functional Limits for tasks assessed  General Comments: off on Day on Day of week        General Comments      Exercises     Assessment/Plan    PT Assessment Patient does not need any further PT services  PT Problem List         PT Treatment Interventions      PT Goals (Current goals can be found in the Care Plan section)  Acute Rehab PT Goals Patient Stated Goal: go home PT Goal Formulation: With patient    Frequency       Co-evaluation               AM-PAC PT 6 Clicks Mobility  Outcome Measure Help needed turning from your back to your side while in a flat bed without using bedrails?: None Help needed moving  from lying on your back to sitting on the side of a flat bed without using bedrails?: None Help needed moving to and from a bed to a chair (including a wheelchair)?: None Help needed standing up from a chair using your arms (e.g., wheelchair or bedside chair)?: None Help needed to walk in hospital room?: None Help needed climbing 3-5 steps with a railing? : None 6 Click Score: 24    End of Session   Activity Tolerance: Patient tolerated treatment well Patient left: in bed;with bed alarm set   PT Visit Diagnosis: History of falling (Z91.81)    Time: 8655-8645 PT Time Calculation (min) (ACUTE ONLY): 10 min   Charges:   PT Evaluation $PT Eval Low Complexity: 1 Low   PT General Charges $$ ACUTE PT VISIT: 1 Visit         Darice Potters PT Acute Rehabilitation Services Office [phone] Weekend [phone]  Potters Darice Norris 07/08/2023, 2:03 PM

## 2023-07-28 ENCOUNTER — Ambulatory Visit: Payer: MEDICAID | Admitting: Family Medicine

## 2023-07-28 ENCOUNTER — Encounter: Payer: Self-pay | Admitting: Family Medicine

## 2023-07-28 ENCOUNTER — Other Ambulatory Visit (HOSPITAL_COMMUNITY): Payer: Self-pay

## 2023-07-28 VITALS — BP 135/79 | HR 74 | Temp 97.9°F | Ht 69.0 in | Wt 164.0 lb

## 2023-07-28 DIAGNOSIS — E119 Type 2 diabetes mellitus without complications: Secondary | ICD-10-CM

## 2023-07-28 DIAGNOSIS — K861 Other chronic pancreatitis: Secondary | ICD-10-CM

## 2023-07-28 DIAGNOSIS — E785 Hyperlipidemia, unspecified: Secondary | ICD-10-CM

## 2023-07-28 DIAGNOSIS — E1169 Type 2 diabetes mellitus with other specified complication: Secondary | ICD-10-CM

## 2023-07-28 DIAGNOSIS — I152 Hypertension secondary to endocrine disorders: Secondary | ICD-10-CM

## 2023-07-28 DIAGNOSIS — E1159 Type 2 diabetes mellitus with other circulatory complications: Secondary | ICD-10-CM | POA: Diagnosis not present

## 2023-07-28 DIAGNOSIS — Z8639 Personal history of other endocrine, nutritional and metabolic disease: Secondary | ICD-10-CM

## 2023-07-28 DIAGNOSIS — Z794 Long term (current) use of insulin: Secondary | ICD-10-CM

## 2023-07-28 DIAGNOSIS — J209 Acute bronchitis, unspecified: Secondary | ICD-10-CM

## 2023-07-28 DIAGNOSIS — Z09 Encounter for follow-up examination after completed treatment for conditions other than malignant neoplasm: Secondary | ICD-10-CM

## 2023-07-28 DIAGNOSIS — E876 Hypokalemia: Secondary | ICD-10-CM

## 2023-07-28 DIAGNOSIS — E1165 Type 2 diabetes mellitus with hyperglycemia: Secondary | ICD-10-CM

## 2023-07-28 MED ORDER — SEMGLEE (YFGN) 100 UNIT/ML ~~LOC~~ SOPN: 70.0000 [IU] | PEN_INJECTOR | Freq: Every day | SUBCUTANEOUS | Status: DC

## 2023-07-28 MED ORDER — DOXYCYCLINE HYCLATE 100 MG PO TABS: 100.0000 mg | ORAL_TABLET | Freq: Two times a day (BID) | ORAL | 0 refills | Status: AC

## 2023-07-28 MED ORDER — ALBUTEROL SULFATE HFA 108 (90 BASE) MCG/ACT IN AERS: 2.0000 | INHALATION_SPRAY | Freq: Four times a day (QID) | RESPIRATORY_TRACT | 0 refills | Status: AC | PRN

## 2023-07-28 MED ORDER — IPRATROPIUM BROMIDE 0.02 % IN SOLN
0.5000 mg | Freq: Once | RESPIRATORY_TRACT | Status: AC
  Administered 2023-07-28: 0.5 mg via RESPIRATORY_TRACT

## 2023-07-28 MED ORDER — OXYCODONE HCL 10 MG PO TABS: 10.0000 mg | ORAL_TABLET | Freq: Four times a day (QID) | ORAL | 0 refills | Status: DC | PRN

## 2023-07-28 NOTE — Progress Notes (Signed)
Subjective: CC:DKA hosp discharge follow up PCP: Steve Ip, DO DGL:OVFIEP R Steve Romero is a 53 y.o. male presenting to clinic today for:  1. Type 2 Diabetes with hypertension, hyperlipidemia:  Glucometer:Libre 3+.   Recently hospitalized for DKA, despite insulin being titrated up in December.  He was discharged on 68u of Semglee and 16u of meal time insulin. BGs have been still 200-300 each morning.  Diabetes Health Maintenance Due  Topic Date Due   HEMOGLOBIN A1C  12/07/2023   FOOT EXAM  12/28/2023   OPHTHALMOLOGY EXAM  03/01/2024    Last A1c:  Lab Results  Component Value Date   HGBA1C >14.0 (H) 06/08/2023    ROS: No chest pain, shortness of breath.  He continues to have intermittent abdominal pain related to his chronic pancreatitis and is under the care of gastroenterology.  He is already been referred to endocrinology by his gastroenterologist but has never heard for an appointment and when he called the [place] office he was told that his "chart was under review".  He continues to lose weight.  He has been utilizing a CBD/THC product from the tobacco store to help with appetite and abdominal pain.  2.  Cough He reports that he is had a productive cough for the last several days.  No fevers, no hemoptysis.  Sputum is yellow in color.  Continues to smoke a few cigarettes per day.   ROS: Per HPI  Allergies  Allergen Reactions   Desipramine Nausea Only and Rash   Past Medical History:  Diagnosis Date   Acute pancreatitis without necrosis or infection, unspecified    AKI (acute kidney injury) (HCC) 05/17/2022   Anxiety 10/2014   Ascites    Bipolar disorder (HCC) age 69   COPD (chronic obstructive pulmonary disease) (HCC) 2016   Depression age 56   Enlarged prostate    Hyperlipidemia 2016   Hypertension 2013   Nausea vomiting and diarrhea 06/15/2022   Schizoaffective disorder (HCC) 11/13/2014   Sleep apnea    Does not wear c-pap, sleeps in sitting up  position per pt   Thyroid disease 11/2014    Current Outpatient Medications:    acetaminophen (TYLENOL) 500 MG tablet, Take 2 tablets (1,000 mg total) by mouth every 8 (eight) hours as needed for mild pain., Disp: 30 tablet, Rfl: 0   Continuous Blood Gluc Receiver (FREESTYLE LIBRE READER) DEVI, 1 Units by Does not apply route daily. UAD to test BGs daily. Dx E11.9, Disp: 1 each, Rfl: 1   Continuous Glucose Sensor (FREESTYLE LIBRE 3 PLUS SENSOR) MISC, Check BGs continuously. E11.9 Change sensor every 15 days., Disp: 6 each, Rfl: 3   esomeprazole (NEXIUM) 20 MG capsule, Take 1 capsule (20 mg total) by mouth daily., Disp: 30 capsule, Rfl: 1   insulin aspart (NOVOLOG FLEXPEN) 100 UNIT/ML FlexPen, Inject 16 units into the skin twice daily with breakfast and dinner and 5 units with snacks. Maximum total daily dose 50 units., Disp: 48 mL, Rfl: 3   Insulin Pen Needle (BD PEN NEEDLE NANO U/F) 32G X 4 MM MISC, UAD to administer insulin, Disp: 100 each, Rfl: 3   lipase/protease/amylase (CREON) 36000 UNITS CPEP capsule, Take 3 capsules with meals and 1-2  capsule with snacks ( up too 2 snacks ) (Patient taking differently: Take 36,000-108,000 Units by mouth See admin instructions. Take 108,000 units by mouth with each meal eaten and 36,000-72,000 units with each snack), Disp: 570 capsule, Rfl: 2   naloxone (NARCAN) nasal spray 4  mg/0.1 mL, Place 1 spray into the nose once., Disp: , Rfl:    Oxycodone HCl 10 MG TABS, Take 1 tablet (10 mg total) by mouth every 6 (six) hours as needed., Disp: 30 tablet, Rfl: 0   rosuvastatin (CRESTOR) 10 MG tablet, Take 1 tablet (10 mg total) by mouth at bedtime. If you experience muscle aches take every other day, Disp: 90 tablet, Rfl: 1   SEMGLEE, YFGN, 100 UNIT/ML Pen, Inject 62 Units into the skin at bedtime. Take 48 units nightly., Disp: , Rfl:  Social History   Socioeconomic History   Marital status: Single    Spouse name: Not on file   Number of children: 0   Years of  education: Not on file   Highest education level: GED or equivalent  Occupational History   Not on file  Tobacco Use   Smoking status: Every Day    Current packs/day: 0.25    Average packs/day: 0.3 packs/day for 26.0 years (6.5 ttl pk-yrs)    Types: Cigarettes   Smokeless tobacco: Never   Tobacco comments:    4-5 cig daily as of 10/07/2020.  Vaping Use   Vaping status: Never Used  Substance and Sexual Activity   Alcohol use: No   Drug use: No   Sexual activity: Never    Birth control/protection: None  Other Topics Concern   Not on file  Social History Narrative   Not on file   Social Drivers of Health   Financial Resource Strain: Low Risk  (07/27/2023)   Overall Financial Resource Strain (CARDIA)    Difficulty of Paying Living Expenses: Not very hard  Food Insecurity: Food Insecurity Present (07/27/2023)   Hunger Vital Sign    Worried About Running Out of Food in the Last Year: Never true    Ran Out of Food in the Last Year: Sometimes true  Transportation Needs: No Transportation Needs (07/27/2023)   PRAPARE - Administrator, Civil Service (Medical): No    Lack of Transportation (Non-Medical): No  Physical Activity: Insufficiently Active (07/27/2023)   Exercise Vital Sign    Days of Exercise per Week: 2 days    Minutes of Exercise per Session: 10 min  Stress: No Stress Concern Present (07/27/2023)   [employer] of Occupational Health - Occupational Stress Questionnaire    Feeling of Stress : Only a little  Social Connections: Unknown (07/27/2023)   Social Connection and Isolation Panel [NHANES]    Frequency of Communication with Friends and Family: Three times a week    Frequency of Social Gatherings with Friends and Family: Once a week    Attends Religious Services: Patient declined    Database administrator or Organizations: No    Attends Engineer, structural: Not on file    Marital Status: Divorced  Intimate Partner Violence: Not At Risk  (07/08/2023)   Humiliation, Afraid, Rape, and Kick questionnaire    Fear of Current or Ex-Partner: No    Emotionally Abused: No    Physically Abused: No    Sexually Abused: No   Family History  Problem Relation Age of Onset   Diabetes Father    Heart disease Father    Heart disease Maternal Grandmother    Stroke Maternal Grandfather    Colon cancer Neg Hx    Esophageal cancer Neg Hx    Inflammatory bowel disease Neg Hx    Liver disease Neg Hx    Pancreatic cancer Neg Hx    Rectal  cancer Neg Hx    Stomach cancer Neg Hx     Objective: Office vital signs reviewed. BP 135/79   Pulse 74   Temp 97.9 F (36.6 C)   Ht 5\' 9"  (1.753 m)   Wt 164 lb (74.4 kg)   SpO2 97%   BMI 24.22 kg/m   Physical Examination:  General: Awake, alert, chronically ill-appearing male, No acute distress HEENT: Mucous membranes slightly tacky.  Poor dentition Cardio: regular rate and rhythm, S1S2 heard, no murmurs appreciated Pulm: Globally decreased breath sounds with mild expiratory wheezes noted.  Normal work of breathing on room air.  No coughing appreciated during exam GI: Nondistended  Assessment/ Plan: 53 y.o. male   Insulin dependent type 2 diabetes mellitus (HCC) - Plan: Microalbumin / creatinine urine ratio, Comprehensive metabolic panel, Ambulatory referral to Endocrinology, SEMGLEE, YFGN, 100 UNIT/ML Pen  Bronchitis with bronchospasm - Plan: albuterol (VENTOLIN HFA) 108 (90 Base) MCG/ACT inhaler, doxycycline (VIBRA-TABS) 100 MG tablet  Hospital discharge follow-up  Hypertension associated with diabetes (HCC) - Plan: Comprehensive metabolic panel  Hyperlipidemia associated with type 2 diabetes mellitus (HCC) - Plan: Comprehensive metabolic panel  Chronic biliary pancreatitis (HCC) - Plan: Comprehensive metabolic panel, Ambulatory referral to Endocrinology, ToxASSURE Select 13 (MW), Urine, Oxycodone HCl 10 MG TABS  Hypokalemia - Plan: Comprehensive metabolic panel  History of diabetic  ketoacidosis - Plan: Comprehensive metabolic panel, Ambulatory referral to Endocrinology  I reviewed his CGM which demonstrated ongoing uncontrolled blood sugars despite advancement of insulin.  I have replaced his endocrinology referral as stat and hopefully this will help him secure an appointment sooner than later.  Sadly his blood sugar remains uncontrolled despite multiple attempts to increase insulin by both our office and the hospital, for which she was recently admitted with DKA.  I have advanced his insulin further.  I have asked him to return in the next 1 to 2 weeks for close follow-up with our clinical pharmacist for CGM review.  We will likely need to continue advancing his insulin pending this review.  Seems to have bronchitis with bronchospasm going on.  Possibly COPD exacerbation that is undiagnosed but because he has uncontrolled blood sugars I hesitate to use steroids in this patient.  I did cover him with oral antibiotics and gave him a DuoNeb here in office which did resolve his wheezes and improve air movement.  Albuterol sent for use.  We will check his liver enzymes, electrolytes and kidney function.  His blood pressure was controlled during today's visit no changes were made  He completed a UDS and CSC today for refill on oxycodone which he has been taking intermittently for pancreatitis.  The national narcotic database reviewed and there were no red flags.  Steve Ip, DO [facility] [phone]

## 2023-07-28 NOTE — Addendum Note (Signed)
Addended by: Waynette Buttery on: 07/28/2023 10:58 AM   Modules accepted: Orders

## 2023-07-28 NOTE — Patient Instructions (Signed)
If your FASTING blood sugar is above 150 for 2 consecutive days, increase by 1 unit of your long acting insulin.  Example: Day 1: Blood sugar was 359 Day 2: Blood sugar was 205  Increase Semglee to 72 units Day 3: Blood sugar is 202 Day 4: Blood sugars 168  Increase Semglee to 74 units Day 5: Blood sugar is 105 Day 6: Blood sugars 145  Stick with 74 units.  Do not increase.

## 2023-07-29 LAB — COMPREHENSIVE METABOLIC PANEL
ALT: 318 [IU]/L — ABNORMAL HIGH (ref 0–44)
AST: 201 [IU]/L — ABNORMAL HIGH (ref 0–40)
Albumin: 4.4 g/dL (ref 3.8–4.9)
Alkaline Phosphatase: 1440 [IU]/L (ref 44–121)
BUN/Creatinine Ratio: 34 — ABNORMAL HIGH (ref 9–20)
BUN: 27 mg/dL — ABNORMAL HIGH (ref 6–24)
Bilirubin Total: 0.6 mg/dL (ref 0.0–1.2)
CO2: 19 mmol/L — ABNORMAL LOW (ref 20–29)
Calcium: 10.4 mg/dL — ABNORMAL HIGH (ref 8.7–10.2)
Chloride: 90 mmol/L — ABNORMAL LOW (ref 96–106)
Creatinine, Ser: 0.8 mg/dL (ref 0.76–1.27)
Globulin, Total: 2.7 g/dL (ref 1.5–4.5)
Sodium: 131 mmol/L — ABNORMAL LOW (ref 134–144)
Total Protein: 7.1 g/dL (ref 6.0–8.5)
eGFR: 106 mL/min/{1.73_m2} (ref >59)

## 2023-07-29 LAB — MICROALBUMIN / CREATININE URINE RATIO
Creatinine, Urine: 19.4 mg/dL
Microalb/Creat Ratio: 20 mg/g{creat} (ref 0–29)
Microalbumin, Urine: 3.8 ug/mL

## 2023-07-30 ENCOUNTER — Encounter: Payer: Self-pay | Admitting: Family Medicine

## 2023-07-31 LAB — TOXASSURE SELECT 13 (MW), URINE

## 2023-08-02 ENCOUNTER — Telehealth: Payer: Self-pay

## 2023-08-02 ENCOUNTER — Other Ambulatory Visit (HOSPITAL_COMMUNITY): Payer: Self-pay

## 2023-08-02 NOTE — Telephone Encounter (Signed)
Pharmacy Patient Advocate Encounter   Received notification from CoverMyMeds that prior authorization for oxyCODONE HCl 10MG  tablets is required/requested.   Insurance verification completed.   The patient is insured through [employer] .   Per test claim: PA required; PA submitted to above mentioned insurance via CoverMyMeds Key/confirmation #/EOC [id number] Status is pending

## 2023-08-04 ENCOUNTER — Other Ambulatory Visit: Payer: Self-pay

## 2023-08-04 ENCOUNTER — Other Ambulatory Visit (HOSPITAL_COMMUNITY): Payer: Self-pay

## 2023-08-04 DIAGNOSIS — K861 Other chronic pancreatitis: Secondary | ICD-10-CM

## 2023-08-04 DIAGNOSIS — K831 Obstruction of bile duct: Secondary | ICD-10-CM

## 2023-08-04 DIAGNOSIS — K859 Acute pancreatitis without necrosis or infection, unspecified: Secondary | ICD-10-CM

## 2023-08-04 DIAGNOSIS — K863 Pseudocyst of pancreas: Secondary | ICD-10-CM

## 2023-08-04 NOTE — Telephone Encounter (Signed)
Pharmacy Patient Advocate Encounter  Received notification from [employer] that Prior Authorization for Oxycodone 10 mg tablets has been APPROVED from 08/03/23 to 01/30/24. Ran test claim, Copay is $4. This test claim was processed through [facility]- copay amounts may vary at other pharmacies due to pharmacy/plan contracts, or as the patient moves through the different stages of their insurance plan.   PA #/Case ID/Reference #: [id number]  *spoke to [facility] pharmacy

## 2023-08-05 ENCOUNTER — Other Ambulatory Visit: Payer: Self-pay

## 2023-08-05 ENCOUNTER — Telehealth: Payer: Self-pay | Admitting: Gastroenterology

## 2023-08-05 DIAGNOSIS — K863 Pseudocyst of pancreas: Secondary | ICD-10-CM

## 2023-08-05 DIAGNOSIS — K859 Acute pancreatitis without necrosis or infection, unspecified: Secondary | ICD-10-CM

## 2023-08-05 DIAGNOSIS — K831 Obstruction of bile duct: Secondary | ICD-10-CM

## 2023-08-05 NOTE — Telephone Encounter (Signed)
Inound call from patient returning phone call. Requesting a call back. Please advise, thank you.

## 2023-08-05 NOTE — Telephone Encounter (Signed)
See results note dated 08/04/23

## 2023-08-11 ENCOUNTER — Inpatient Hospital Stay (HOSPITAL_COMMUNITY)
Admission: EM | Admit: 2023-08-11 | Discharge: 2023-08-13 | DRG: 638 | Disposition: A | Discharge status: Home / Self Care | Payer: MEDICAID | Attending: Family Medicine | Admitting: Family Medicine

## 2023-08-11 ENCOUNTER — Encounter (HOSPITAL_COMMUNITY): Payer: Self-pay | Admitting: Emergency Medicine

## 2023-08-11 ENCOUNTER — Other Ambulatory Visit: Payer: Self-pay

## 2023-08-11 ENCOUNTER — Emergency Department (HOSPITAL_COMMUNITY): Payer: MEDICAID

## 2023-08-11 DIAGNOSIS — N179 Acute kidney failure, unspecified: Secondary | ICD-10-CM | POA: Diagnosis present

## 2023-08-11 DIAGNOSIS — E1169 Type 2 diabetes mellitus with other specified complication: Secondary | ICD-10-CM | POA: Diagnosis present

## 2023-08-11 DIAGNOSIS — F259 Schizoaffective disorder, unspecified: Secondary | ICD-10-CM | POA: Diagnosis present

## 2023-08-11 DIAGNOSIS — Z9049 Acquired absence of other specified parts of digestive tract: Secondary | ICD-10-CM

## 2023-08-11 DIAGNOSIS — E785 Hyperlipidemia, unspecified: Secondary | ICD-10-CM | POA: Diagnosis present

## 2023-08-11 DIAGNOSIS — E1010 Type 1 diabetes mellitus with ketoacidosis without coma: Principal | ICD-10-CM | POA: Diagnosis present

## 2023-08-11 DIAGNOSIS — Z794 Long term (current) use of insulin: Secondary | ICD-10-CM | POA: Diagnosis not present

## 2023-08-11 DIAGNOSIS — F411 Generalized anxiety disorder: Secondary | ICD-10-CM | POA: Diagnosis present

## 2023-08-11 DIAGNOSIS — K859 Acute pancreatitis without necrosis or infection, unspecified: Secondary | ICD-10-CM | POA: Diagnosis not present

## 2023-08-11 DIAGNOSIS — Z823 Family history of stroke: Secondary | ICD-10-CM | POA: Diagnosis not present

## 2023-08-11 DIAGNOSIS — E039 Hypothyroidism, unspecified: Secondary | ICD-10-CM | POA: Diagnosis present

## 2023-08-11 DIAGNOSIS — F319 Bipolar disorder, unspecified: Secondary | ICD-10-CM | POA: Diagnosis present

## 2023-08-11 DIAGNOSIS — E1159 Type 2 diabetes mellitus with other circulatory complications: Secondary | ICD-10-CM | POA: Diagnosis present

## 2023-08-11 DIAGNOSIS — E875 Hyperkalemia: Secondary | ICD-10-CM | POA: Diagnosis present

## 2023-08-11 DIAGNOSIS — E860 Dehydration: Secondary | ICD-10-CM | POA: Diagnosis present

## 2023-08-11 DIAGNOSIS — F17210 Nicotine dependence, cigarettes, uncomplicated: Secondary | ICD-10-CM | POA: Diagnosis present

## 2023-08-11 DIAGNOSIS — I10 Essential (primary) hypertension: Secondary | ICD-10-CM | POA: Diagnosis present

## 2023-08-11 DIAGNOSIS — K863 Pseudocyst of pancreas: Secondary | ICD-10-CM | POA: Diagnosis present

## 2023-08-11 DIAGNOSIS — Z79899 Other long term (current) drug therapy: Secondary | ICD-10-CM

## 2023-08-11 DIAGNOSIS — K861 Other chronic pancreatitis: Secondary | ICD-10-CM | POA: Diagnosis present

## 2023-08-11 DIAGNOSIS — Z7982 Long term (current) use of aspirin: Secondary | ICD-10-CM | POA: Diagnosis not present

## 2023-08-11 DIAGNOSIS — Z833 Family history of diabetes mellitus: Secondary | ICD-10-CM

## 2023-08-11 DIAGNOSIS — Z888 Allergy status to other drugs, medicaments and biological substances status: Secondary | ICD-10-CM

## 2023-08-11 DIAGNOSIS — G4733 Obstructive sleep apnea (adult) (pediatric): Secondary | ICD-10-CM | POA: Diagnosis present

## 2023-08-11 DIAGNOSIS — N400 Enlarged prostate without lower urinary tract symptoms: Secondary | ICD-10-CM | POA: Diagnosis present

## 2023-08-11 DIAGNOSIS — E1110 Type 2 diabetes mellitus with ketoacidosis without coma: Secondary | ICD-10-CM

## 2023-08-11 DIAGNOSIS — E119 Type 2 diabetes mellitus without complications: Secondary | ICD-10-CM

## 2023-08-11 DIAGNOSIS — J449 Chronic obstructive pulmonary disease, unspecified: Secondary | ICD-10-CM | POA: Diagnosis present

## 2023-08-11 DIAGNOSIS — E871 Hypo-osmolality and hyponatremia: Secondary | ICD-10-CM | POA: Diagnosis present

## 2023-08-11 DIAGNOSIS — Z8249 Family history of ischemic heart disease and other diseases of the circulatory system: Secondary | ICD-10-CM | POA: Diagnosis not present

## 2023-08-11 DIAGNOSIS — K8689 Other specified diseases of pancreas: Secondary | ICD-10-CM | POA: Diagnosis present

## 2023-08-11 HISTORY — DX: Type 2 diabetes mellitus with ketoacidosis without coma: E11.10

## 2023-08-11 LAB — COMPREHENSIVE METABOLIC PANEL
ALT: 135 U/L — ABNORMAL HIGH (ref 0–44)
AST: 19 U/L (ref 15–41)
Albumin: 4.1 g/dL (ref 3.5–5.0)
Alkaline Phosphatase: 1076 U/L — ABNORMAL HIGH (ref 38–126)
Anion gap: 23 — ABNORMAL HIGH (ref 5–15)
BUN: 33 mg/dL — ABNORMAL HIGH (ref 6–20)
CO2: 15 mmol/L — ABNORMAL LOW (ref 22–32)
Calcium: 9.8 mg/dL (ref 8.9–10.3)
Chloride: 84 mmol/L — ABNORMAL LOW (ref 98–111)
Creatinine, Ser: 1.34 mg/dL — ABNORMAL HIGH (ref 0.61–1.24)
GFR, Estimated: >60 mL/min (ref >60)
Glucose, Bld: 975 mg/dL (ref 70–99)
Potassium: 6.5 mmol/L (ref 3.5–5.1)
Sodium: 122 mmol/L — ABNORMAL LOW (ref 135–145)
Total Bilirubin: 1.5 mg/dL — ABNORMAL HIGH (ref 0.0–1.2)
Total Protein: 8.2 g/dL — ABNORMAL HIGH (ref 6.5–8.1)

## 2023-08-11 LAB — URINALYSIS, ROUTINE W REFLEX MICROSCOPIC
Bacteria, UA: NONE SEEN
Bilirubin Urine: NEGATIVE
Glucose, UA: >=500 mg/dL — AB
Hgb urine dipstick: NEGATIVE
Ketones, ur: 20 mg/dL — AB
Leukocytes,Ua: NEGATIVE
Nitrite: NEGATIVE
Protein, ur: NEGATIVE mg/dL
Specific Gravity, Urine: 1.026 (ref 1.005–1.030)
pH: 5 (ref 5.0–8.0)

## 2023-08-11 LAB — CBC
HCT: 54.4 % — ABNORMAL HIGH (ref 39.0–52.0)
Hemoglobin: 17.9 g/dL — ABNORMAL HIGH (ref 13.0–17.0)
MCH: 30.4 pg (ref 26.0–34.0)
MCHC: 32.9 g/dL (ref 30.0–36.0)
MCV: 92.5 fL (ref 80.0–100.0)
Platelets: 510 10*3/uL — ABNORMAL HIGH (ref 150–400)
RBC: 5.88 MIL/uL — ABNORMAL HIGH (ref 4.22–5.81)
RDW: 12.8 % (ref 11.5–15.5)
WBC: 12.6 10*3/uL — ABNORMAL HIGH (ref 4.0–10.5)
nRBC: 0 % (ref 0.0–0.2)

## 2023-08-11 LAB — CBG MONITORING, ED: Glucose-Capillary: >600 mg/dL (ref 70–99)

## 2023-08-11 LAB — LIPASE, BLOOD: Lipase: 79 U/L — ABNORMAL HIGH (ref 11–51)

## 2023-08-11 MED ORDER — INSULIN REGULAR(HUMAN) IN NACL 100-0.9 UT/100ML-% IV SOLN
INTRAVENOUS | Status: DC
  Administered 2023-08-12: 8 [IU]/h via INTRAVENOUS
  Filled 2023-08-11: qty 100

## 2023-08-11 MED ORDER — HYDROMORPHONE HCL 1 MG/ML IJ SOLN
1.0000 mg | Freq: Once | INTRAMUSCULAR | Status: AC
  Administered 2023-08-11: 1 mg via INTRAVENOUS
  Filled 2023-08-11: qty 1

## 2023-08-11 MED ORDER — SODIUM CHLORIDE 0.9 % IV BOLUS
1000.0000 mL | Freq: Once | INTRAVENOUS | Status: AC
  Administered 2023-08-11: 1000 mL via INTRAVENOUS

## 2023-08-11 MED ORDER — HYDROMORPHONE HCL 1 MG/ML IJ SOLN
1.0000 mg | Freq: Once | INTRAMUSCULAR | Status: AC
Start: 2023-08-11 — End: 2023-08-11
  Administered 2023-08-11: 1 mg via INTRAVENOUS
  Filled 2023-08-11: qty 1

## 2023-08-11 MED ORDER — LACTATED RINGERS IV BOLUS
20.0000 mL/kg | Freq: Once | INTRAVENOUS | Status: AC
  Administered 2023-08-11: 1488 mL via INTRAVENOUS

## 2023-08-11 MED ORDER — LACTATED RINGERS IV SOLN: INTRAVENOUS | Status: DC

## 2023-08-11 MED ORDER — DEXTROSE 50 % IV SOLN: 0.0000 mL | INTRAVENOUS | Status: DC | PRN

## 2023-08-11 MED ORDER — DEXTROSE IN LACTATED RINGERS 5 % IV SOLN: INTRAVENOUS | Status: DC

## 2023-08-11 MED ORDER — IOHEXOL 350 MG/ML SOLN
75.0000 mL | Freq: Once | INTRAVENOUS | Status: AC | PRN
  Administered 2023-08-11: 75 mL via INTRAVENOUS

## 2023-08-11 NOTE — ED Notes (Signed)
 Pt sating in the high 80's , pt will be placed on 2L [place]

## 2023-08-11 NOTE — H&P (Signed)
 History and Physical    Patient: Steve Romero FMW:[id number] DOB: [birth year] DOA: 08/11/2023 DOS: the patient was seen and examined on 08/11/2023 PCP: Jolinda Norene HERO, DO  Patient coming from: Home  Chief Complaint:  Chief Complaint  Patient presents with   Abdominal Pain   Weakness   HPI: Steve Romero is a 53 y.o. male with medical history significant of chronic pancreatitis with pancreatic pseudocyst, bipolar disorder, COPD, essential hypertension, hyperlipidemia, schizoaffective disorder, obstructive sleep apnea, hypothyroidism and depression with recurrent DKA's presenting to the ER with nausea vomiting and abdominal pain.  Patient also reported generalized weakness especially in his legs.  He was found to have a blood sugar more than 900 , pH of 7.24 hyperkalemia as well as gap of 23.  Patient appears to have gone into another DKA.  Patient will be admitted for further evaluation and treatment.  Review of Systems: As mentioned in the history of present illness. All other systems reviewed and are negative. Past Medical History:  Diagnosis Date   Acute pancreatitis without necrosis or infection, unspecified    AKI (acute kidney injury) (HCC) 05/17/2022   Anxiety 10/2014   Ascites    Bipolar disorder [facility]) age 70   COPD (chronic obstructive pulmonary disease) (HCC) 2016   Depression age 38   Enlarged prostate    Hyperlipidemia 2016   Hypertension 2013   Nausea vomiting and diarrhea 06/15/2022   Schizoaffective disorder (HCC) 11/13/2014   Sleep apnea    Does not wear c-pap, sleeps in sitting up position per pt   Thyroid  disease 11/2014   Past Surgical History:  Procedure Laterality Date   BALLOON DILATION N/A 11/07/2020   Procedure: BALLOON DILATION;  Surgeon: Wilhelmenia Aloha Raddle., MD;  Location: [facility] ENDOSCOPY;  Service: Gastroenterology;  Laterality: N/A;   BALLOON DILATION N/A 06/26/2021   Procedure: BALLOON DILATION;  Surgeon: Wilhelmenia Aloha Raddle., MD;   Location: [facility] ENDOSCOPY;  Service: Gastroenterology;  Laterality: N/A;   BILIARY DILATION  07/24/2021   Procedure: BILIARY DILATION;  Surgeon: Wilhelmenia Aloha Raddle., MD;  Location: [facility] ENDOSCOPY;  Service: Gastroenterology;;   BILIARY DILATION  05/22/2022   Procedure: BILIARY DILATION;  Surgeon: Wilhelmenia Aloha Raddle., MD;  Location: [facility];  Service: Gastroenterology;;   BILIARY DILATION  06/16/2022   Procedure: GASTROSTOMY DILATION;  Surgeon: Wilhelmenia Aloha Raddle., MD;  Location: [facility]COPY;  Service: Gastroenterology;;   BILIARY STENT PLACEMENT N/A 10/10/2020   Procedure: BILIARY STENT PLACEMENT;  Surgeon: Golda Claudis PENNER, MD;  Location: [facility];  Service: Endoscopy;  Laterality: N/A;   BILIARY STENT PLACEMENT  11/09/2020   Procedure: BILIARY STENT PLACEMENT;  Surgeon: Wilhelmenia Aloha Raddle., MD;  Location: [facility] ENDOSCOPY;  Service: Gastroenterology;;   BILIARY STENT PLACEMENT  11/07/2020   Procedure: BILIARY STENT PLACEMENT;  Surgeon: Wilhelmenia Aloha Raddle., MD;  Location: [facility] ENDOSCOPY;  Service: Gastroenterology;;   BILIARY STENT PLACEMENT  11/11/2020   Procedure: BILIARY STENT PLACEMENT;  Surgeon: Wilhelmenia Aloha Raddle., MD;  Location: [facility] ENDOSCOPY;  Service: Gastroenterology;;   BILIARY STENT PLACEMENT  11/14/2020   Procedure: BILIARY STENT PLACEMENT;  Surgeon: Wilhelmenia Aloha Raddle., MD;  Location: [facility] ENDOSCOPY;  Service: Gastroenterology;;   BILIARY STENT PLACEMENT N/A 01/29/2021   Procedure: BILIARY STENT PLACEMENT;  Surgeon: Wilhelmenia Aloha Raddle., MD;  Location: [facility];  Service: Gastroenterology;  Laterality: N/A;   BILIARY STENT PLACEMENT  06/26/2021   Procedure: BILIARY STENT PLACEMENT;  Surgeon: Wilhelmenia Aloha Raddle., MD;  Location: [facility] ENDOSCOPY;  Service: Gastroenterology;;   BILIARY STENT  PLACEMENT  07/24/2021   Procedure: BILIARY STENT PLACEMENT;  Surgeon: Wilhelmenia Aloha Raddle., MD;  Location: [facility] ENDOSCOPY;  Service: Gastroenterology;;   BIOPSY   11/07/2020   Procedure: BIOPSY;  Surgeon: Wilhelmenia Aloha Raddle., MD;  Location: [facility] ENDOSCOPY;  Service: Gastroenterology;;   BIOPSY  12/20/2020   Procedure: BIOPSY;  Surgeon: Wilhelmenia Aloha Raddle., MD;  Location: [facility];  Service: Gastroenterology;;   BIOPSY  06/26/2021   Procedure: BIOPSY;  Surgeon: Wilhelmenia Aloha Raddle., MD;  Location: [facility] ENDOSCOPY;  Service: Gastroenterology;;   CARPAL TUNNEL RELEASE Left 02/21/2016   Procedure: CARPAL TUNNEL RELEASE;  Surgeon: Taft FORBES Minerva, MD;  Location: [facility];  Service: Orthopedics;  Laterality: Left;   CHOLECYSTECTOMY N/A 10/09/2020   Procedure: LAPAROSCOPIC CHOLECYSTECTOMY;  Surgeon: Kallie Manuelita BROCKS, MD;  Location: [facility];  Service: General;  Laterality: N/A;   CYST ENTEROSTOMY N/A 11/07/2020   Procedure: CYST ENTEROSTOMY;  Surgeon: Wilhelmenia Aloha Raddle., MD;  Location: [facility] ENDOSCOPY;  Service: Gastroenterology;  Laterality: N/A;   CYST GASTROSTOMY  11/11/2020   Procedure: CYST NECROSECTOMY;  Surgeon: Wilhelmenia Aloha Raddle., MD;  Location: [facility] ENDOSCOPY;  Service: Gastroenterology;;   CYST GASTROSTOMY  11/14/2020   Procedure: CYST NECROSECTOMY;  Surgeon: Wilhelmenia Aloha Raddle., MD;  Location: [facility] ENDOSCOPY;  Service: Gastroenterology;;   CYST GASTROSTOMY  06/16/2022   Procedure: CYST GASTROSTOMY;  Surgeon: Wilhelmenia Aloha Raddle., MD;  Location: [facility] ENDOSCOPY;  Service: Gastroenterology;;   CYST REMOVAL HAND     CYSTOSCOPY  06/26/2021   Procedure: CYSTOGASTROSTOMY;  Surgeon: Mansouraty, Aloha Raddle., MD;  Location: [facility] ENDOSCOPY;  Service: Gastroenterology;;   ENDOSCOPIC RETROGRADE CHOLANGIOPANCREATOGRAPHY (ERCP) WITH PROPOFOL  N/A 11/09/2020   Procedure: ENDOSCOPIC RETROGRADE CHOLANGIOPANCREATOGRAPHY (ERCP) WITH PROPOFOL ;  Surgeon: Wilhelmenia Aloha Raddle., MD;  Location: [facility] ENDOSCOPY;  Service: Gastroenterology;  Laterality: N/A;   ENDOSCOPIC RETROGRADE CHOLANGIOPANCREATOGRAPHY (ERCP) WITH PROPOFOL  N/A 12/20/2020   Procedure:  ENDOSCOPIC RETROGRADE CHOLANGIOPANCREATOGRAPHY (ERCP) WITH PROPOFOL ;  Surgeon: Wilhelmenia Aloha Raddle., MD;  Location: [facility] ENDOSCOPY;  Service: Gastroenterology;  Laterality: N/A;   ENDOSCOPIC RETROGRADE CHOLANGIOPANCREATOGRAPHY (ERCP) WITH PROPOFOL  N/A 01/29/2021   Procedure: ENDOSCOPIC RETROGRADE CHOLANGIOPANCREATOGRAPHY (ERCP) WITH PROPOFOL ;  Surgeon: Wilhelmenia Aloha Raddle., MD;  Location: [facility] ENDOSCOPY;  Service: Gastroenterology;  Laterality: N/A;   ENDOSCOPIC RETROGRADE CHOLANGIOPANCREATOGRAPHY (ERCP) WITH PROPOFOL  N/A 07/24/2021   Procedure: ENDOSCOPIC RETROGRADE CHOLANGIOPANCREATOGRAPHY (ERCP) WITH PROPOFOL ;  Surgeon: Wilhelmenia Aloha Raddle., MD;  Location: [facility] ENDOSCOPY;  Service: Gastroenterology;  Laterality: N/A;   ERCP N/A 10/10/2020   Procedure: ENDOSCOPIC RETROGRADE CHOLANGIOPANCREATOGRAPHY (ERCP);  Surgeon: Golda Claudis PENNER, MD;  Location: [facility];  Service: Endoscopy;  Laterality: N/A;   ERCP     ERCP N/A 05/22/2022   Procedure: ENDOSCOPIC RETROGRADE CHOLANGIOPANCREATOGRAPHY (ERCP);  Surgeon: Wilhelmenia Aloha Raddle., MD;  Location: [facility];  Service: Gastroenterology;  Laterality: N/A;   ESOPHAGOGASTRODUODENOSCOPY N/A 11/11/2020   Procedure: ESOPHAGOGASTRODUODENOSCOPY (EGD);  Surgeon: Wilhelmenia Aloha Raddle., MD;  Location: [facility] ENDOSCOPY;  Service: Gastroenterology;  Laterality: N/A;   ESOPHAGOGASTRODUODENOSCOPY N/A 11/14/2020   Procedure: ESOPHAGOGASTRODUODENOSCOPY (EGD);  Surgeon: Wilhelmenia Aloha Raddle., MD;  Location: [facility] ENDOSCOPY;  Service: Gastroenterology;  Laterality: N/A;   ESOPHAGOGASTRODUODENOSCOPY (EGD) WITH PROPOFOL  N/A 11/09/2020   Procedure: ESOPHAGOGASTRODUODENOSCOPY (EGD) WITH PROPOFOL ;  Surgeon: Wilhelmenia Aloha Raddle., MD;  Location: [facility] ENDOSCOPY;  Service: Gastroenterology;  Laterality: N/A;   ESOPHAGOGASTRODUODENOSCOPY (EGD) WITH PROPOFOL  N/A 11/07/2020   Procedure: ESOPHAGOGASTRODUODENOSCOPY (EGD) WITH PROPOFOL ;  Surgeon: Wilhelmenia Aloha Raddle., MD;  Location:  [facility] ENDOSCOPY;  Service: Gastroenterology;  Laterality: N/A;   ESOPHAGOGASTRODUODENOSCOPY (EGD) WITH PROPOFOL  N/A 12/20/2020   Procedure:  ESOPHAGOGASTRODUODENOSCOPY (EGD) WITH PROPOFOL ;  Surgeon: Mansouraty, Aloha Raddle., MD;  Location: [facility];  Service: Gastroenterology;  Laterality: N/A;   ESOPHAGOGASTRODUODENOSCOPY (EGD) WITH PROPOFOL  N/A 06/26/2021   Procedure: ESOPHAGOGASTRODUODENOSCOPY (EGD) WITH PROPOFOL ;  Surgeon: Mansouraty, Aloha Raddle., MD;  Location: [facility] ENDOSCOPY;  Service: Gastroenterology;  Laterality: N/A;   ESOPHAGOGASTRODUODENOSCOPY (EGD) WITH PROPOFOL  N/A 07/24/2021   Procedure: ESOPHAGOGASTRODUODENOSCOPY (EGD) WITH PROPOFOL ;  Surgeon: Wilhelmenia Aloha Raddle., MD;  Location: [facility] ENDOSCOPY;  Service: Gastroenterology;  Laterality: N/A;  AXIOS STENT   ESOPHAGOGASTRODUODENOSCOPY (EGD) WITH PROPOFOL  N/A 05/22/2022   Procedure: ESOPHAGOGASTRODUODENOSCOPY (EGD) WITH PROPOFOL ;  Surgeon: Wilhelmenia Aloha Raddle., MD;  Location: [facility]OSCOPY;  Service: Gastroenterology;  Laterality: N/A;   ESOPHAGOGASTRODUODENOSCOPY (EGD) WITH PROPOFOL  N/A 06/16/2022   Procedure: ESOPHAGOGASTRODUODENOSCOPY (EGD) WITH PROPOFOL ;  Surgeon: Wilhelmenia Aloha Raddle., MD;  Location: [facility]OSCOPY;  Service: Gastroenterology;  Laterality: N/A;   ESOPHAGOGASTRODUODENOSCOPY (EGD) WITH PROPOFOL  N/A 07/30/2022   Procedure: ESOPHAGOGASTRODUODENOSCOPY (EGD) WITH PROPOFOL ;  Surgeon: Wilhelmenia Aloha Raddle., MD;  Location: [facility]OSCOPY;  Service: Gastroenterology;  Laterality: N/A;   ESOPHAGOGASTRODUODENOSCOPY (EGD) WITH PROPOFOL  N/A 03/30/2023   Procedure: ESOPHAGOGASTRODUODENOSCOPY (EGD) WITH PROPOFOL ;  Surgeon: Avram Lupita BRAVO, MD;  Location: [facility]OSCOPY;  Service: Gastroenterology;  Laterality: N/A;   EUS  11/14/2020   Procedure: UPPER ENDOSCOPIC ULTRASOUND (EUS) LINEAR;  Surgeon: Wilhelmenia Aloha Raddle., MD;  Location: [facility] ENDOSCOPY;  Service: Gastroenterology;;   EUS N/A 06/26/2021   Procedure: UPPER ENDOSCOPIC  ULTRASOUND (EUS) RADIAL;  Surgeon: Wilhelmenia Aloha Raddle., MD;  Location: [facility] ENDOSCOPY;  Service: Gastroenterology;  Laterality: N/A;   EUS N/A 05/22/2022   Procedure: UPPER ENDOSCOPIC ULTRASOUND (EUS) LINEAR;  Surgeon: Wilhelmenia Aloha Raddle., MD;  Location: [facility];  Service: Gastroenterology;  Laterality: N/A;   EUS N/A 06/16/2022   Procedure: UPPER ENDOSCOPIC ULTRASOUND (EUS) LINEAR;  Surgeon: Wilhelmenia Aloha Raddle., MD;  Location: [facility] ENDOSCOPY;  Service: Gastroenterology;  Laterality: N/A;   FINE NEEDLE ASPIRATION  05/22/2022   Procedure: FINE NEEDLE ASPIRATION;  Surgeon: Wilhelmenia Aloha Raddle., MD;  Location: [facility];  Service: Gastroenterology;;   GASTROINTESTINAL STENT REMOVAL  12/20/2020   Procedure: GASTROINTESTINAL STENT REMOVAL;  Surgeon: Wilhelmenia Aloha Raddle., MD;  Location: [facility] ENDOSCOPY;  Service: Gastroenterology;;  cyst gastrostomy stent and double pig tail stents x2 removed   HERNIA REPAIR Left 2002   groin   INGUINAL HERNIA REPAIR Left 04/05/2017   Procedure: RECURRENT HERNIA REPAIR INGUINAL ADULT WITH MESH;  Surgeon: Mavis Anes, MD;  Location: [facility];  Service: General;  Laterality: Left;   MASS EXCISION Right 04/29/2020   Procedure: EXCISION MASS RIGHT WRIST;  Surgeon: Murrell Drivers, MD;  Location: [facility];  Service: Orthopedics;  Laterality: Right;   MASS EXCISION Right 10/09/2020   Procedure: EXCISION MASS, ABDOMINAL WALL, 2CM;  Surgeon: Kallie Manuelita BROCKS, MD;  Location: [facility];  Service: General;  Laterality: Right;   PANCREATIC STENT PLACEMENT  06/26/2021   Procedure: AXIOS STENT PLACEMENT;  Surgeon: Wilhelmenia Aloha Raddle., MD;  Location: [facility] ENDOSCOPY;  Service: Gastroenterology;;   PANCREATIC STENT PLACEMENT  06/16/2022   Procedure: PANCREATIC STENT PLACEMENT;  Surgeon: Wilhelmenia Aloha Raddle., MD;  Location: [facility];  Service: Gastroenterology;;   REMOVAL OF STONES  12/20/2020   Procedure: REMOVAL OF STONES;  Surgeon:  Wilhelmenia Aloha Raddle., MD;  Location: [facility];  Service: Gastroenterology;;   REMOVAL OF STONES  07/24/2021   Procedure: REMOVAL OF STONES;  Surgeon: Wilhelmenia Aloha Raddle., MD;  Location: [facility] ENDOSCOPY;  Service: Gastroenterology;;   REMOVAL OF STONES  05/22/2022  Procedure: REMOVAL OF STONES;  Surgeon: Wilhelmenia Aloha Raddle., MD;  Location: [facility];  Service: Gastroenterology;;   ANNETT N/A 10/10/2020   Procedure: ANNETT;  Surgeon: Golda Claudis PENNER, MD;  Location: [facility];  Service: Endoscopy;  Laterality: N/A;   SPHINCTEROTOMY  07/24/2021   Procedure: SPHINCTEROTOMY;  Surgeon: Mansouraty, Aloha Raddle., MD;  Location: [facility] ENDOSCOPY;  Service: Gastroenterology;;   SPLENECTOMY, TOTAL N/A 11/17/2022   Procedure: SPLENECTOMY;  Surgeon: Dasie Leonor CROME, MD;  Location: [facility] OR;  Service: General;  Laterality: N/A;   SPYGLASS CHOLANGIOSCOPY N/A 12/20/2020   Procedure: DEBHOJDD CHOLANGIOSCOPY;  Surgeon: Wilhelmenia Aloha Raddle., MD;  Location: [facility];  Service: Gastroenterology;  Laterality: N/A;   SPYGLASS CHOLANGIOSCOPY N/A 07/24/2021   Procedure: SPYGLASS CHOLANGIOSCOPY;  Surgeon: Wilhelmenia Aloha Raddle., MD;  Location: [facility] ENDOSCOPY;  Service: Gastroenterology;  Laterality: N/A;   STENT REMOVAL  11/09/2020   Procedure: STENT REMOVAL;  Surgeon: Wilhelmenia Aloha Raddle., MD;  Location: [facility] ENDOSCOPY;  Service: Gastroenterology;;   CLEDA REMOVAL  11/11/2020   Procedure: STENT REMOVAL;  Surgeon: Wilhelmenia Aloha Raddle., MD;  Location: [facility] ENDOSCOPY;  Service: Gastroenterology;;   CLEDA REMOVAL  11/14/2020   Procedure: STENT REMOVAL;  Surgeon: Wilhelmenia Aloha Raddle., MD;  Location: [facility] ENDOSCOPY;  Service: Gastroenterology;;   CLEDA REMOVAL  12/20/2020   Procedure: STENT REMOVAL;  Surgeon: Wilhelmenia Aloha Raddle., MD;  Location: [facility];  Service: Gastroenterology;;  biliary x2   STENT REMOVAL  07/24/2021   Procedure: AXIOS STENT REMOVAL;  Surgeon: Wilhelmenia Aloha Raddle.,  MD;  Location: [facility] ENDOSCOPY;  Service: Gastroenterology;;   CLEDA REMOVAL  07/30/2022   Procedure: STENT REMOVAL;  Surgeon: Wilhelmenia Aloha Raddle., MD;  Location: [facility];  Service: Gastroenterology;;   UPPER ESOPHAGEAL ENDOSCOPIC ULTRASOUND (EUS) N/A 11/07/2020   Procedure: UPPER ESOPHAGEAL ENDOSCOPIC ULTRASOUND (EUS);  Surgeon: Wilhelmenia Aloha Raddle., MD;  Location: [facility] ENDOSCOPY;  Service: Gastroenterology;  Laterality: N/A;   UPPER GASTROINTESTINAL ENDOSCOPY     WOUND DEBRIDEMENT  11/09/2020   Procedure: CYST NECROSECTOMY;  Surgeon: Wilhelmenia Aloha Raddle., MD;  Location: [facility] ENDOSCOPY;  Service: Gastroenterology;;   Social History:  reports that he has been smoking cigarettes. He has a 6.5 pack-year smoking history. He has never used smokeless tobacco. He reports that he does not drink alcohol and does not use drugs.  Allergies  Allergen Reactions   Desipramine Nausea Only and Rash    Family History  Problem Relation Age of Onset   Diabetes Father    Heart disease Father    Heart disease Maternal Grandmother    Stroke Maternal Grandfather    Colon cancer Neg Hx    Esophageal cancer Neg Hx    Inflammatory bowel disease Neg Hx    Liver disease Neg Hx    Pancreatic cancer Neg Hx    Rectal cancer Neg Hx    Stomach cancer Neg Hx     Prior to Admission medications   Medication Sig Start Date End Date Taking? Authorizing Provider  acetaminophen  (TYLENOL ) 500 MG tablet Take 2 tablets (1,000 mg total) by mouth every 8 (eight) hours as needed for mild pain. 11/23/22  Yes Dasie Leonor CROME, MD  albuterol  (VENTOLIN  HFA) 108 [phone] Base) MCG/ACT inhaler Inhale 2 puffs into the lungs every 6 (six) hours as needed for wheezing or shortness of breath. 07/28/23  Yes Jolinda Potter M, DO  aspirin  EC 81 MG tablet Take 81-162 mg by mouth daily as needed (for pain). Swallow whole.   Yes [provider]  Continuous  Glucose Sensor (FREESTYLE LIBRE 3 PLUS SENSOR) MISC Check BGs  continuously. E11.9 Change sensor every 15 days. 04/20/23  Yes Gottschalk, Norene M, DO  doxycycline  (DORYX ) 100 MG EC tablet Take 100 mg by mouth 2 (two) times daily. 08/09/23 08/16/23 Yes [provider]  lipase/protease/amylase (CREON ) 36000 UNITS CPEP capsule Take 3 capsules with meals and 1-2  capsule with snacks ( up too 2 snacks ) Patient taking differently: Take 36,000-108,000 Units by mouth See admin instructions. Take 108,000-144,000 units by mouth with each meal eaten and 72,000 units with each snack 01/20/23  Yes Mansouraty, Aloha Raddle., MD  naloxone  (NARCAN ) nasal spray 4 mg/0.1 mL Place 1 spray into the nose as directed. 06/08/23  Yes [provider]  NOVOLOG  RELION 100 UNIT/ML injection Inject 5-15 Units into the skin See admin instructions. Inject 15 units into the skin three times a day with meals and 5-10 units as needed with snacks, depending on BGL reading   Yes [provider]  ondansetron  (ZOFRAN ) 4 MG tablet Take 4 mg by mouth every 6 (six) hours as needed for nausea or vomiting.   Yes [provider]  Oxycodone  HCl 10 MG TABS Take 1 tablet (10 mg total) by mouth every 6 (six) hours as needed. Patient taking differently: Take 10 mg by mouth every 6 (six) hours as needed (for pain). 07/28/23  Yes Jolinda Norene M, DO  rosuvastatin  (CRESTOR ) 10 MG tablet Take 1 tablet (10 mg total) by mouth at bedtime. If you experience muscle aches take every other day Patient taking differently: Take 10 mg by mouth 2 (two) times a week. 02/04/23  Yes Mallipeddi, Vishnu P, MD  SEMGLEE , YFGN, 100 UNIT/ML Pen Inject 70-80 Units into the skin at bedtime. Patient taking differently: Inject 73-75 Units into the skin at bedtime. 07/28/23  Yes Gottschalk, Norene M, DO  Continuous Blood Gluc Receiver (FREESTYLE LIBRE READER) DEVI 1 Units by Does not apply route daily. UAD to test BGs daily. Dx E11.9 09/25/22   Jolinda Norene M, DO  esomeprazole  (NEXIUM ) 20 MG capsule Take 1  capsule (20 mg total) by mouth daily. Patient not taking: Reported on 08/11/2023 03/31/23 03/30/24  Amin, Sumayya, MD  insulin  aspart (NOVOLOG  FLEXPEN) 100 UNIT/ML FlexPen Inject 16 units into the skin twice daily with breakfast and dinner and 5 units with snacks. Maximum total daily dose 50 units. Patient not taking: Reported on 08/11/2023 07/08/23   Leotis Bogus, MD  Insulin  Pen Needle (BD PEN NEEDLE NANO U/F) 32G X 4 MM MISC UAD to administer insulin  09/25/22   Jolinda Norene HERO, DO    Physical Exam: Vitals:   08/11/23 2230 08/11/23 2245 08/11/23 2300 08/11/23 2318  BP: (!) 143/97 (!) 146/99  132/80  Pulse: 73 74  72  Resp: 11 15  18   Temp:    98.1 F (36.7 C)  TempSrc:    Oral  SpO2: 92% 92%  97%  Weight:   74.4 kg    Constitutional: Acute on chronically ill looking, mild distress Eyes: PERRL, lids and conjunctivae normal ENMT: Mucous membranes are dry. Posterior pharynx clear of any exudate or lesions.Normal dentition.  Neck: normal, supple, no masses, no thyromegaly Respiratory: clear to auscultation bilaterally, no wheezing, no crackles. Normal respiratory effort. No accessory muscle use.  Cardiovascular: Regular rate and rhythm, no murmurs / rubs / gallops. No extremity edema. 2+ pedal pulses. No carotid bruits.  Abdomen: Mild tenderness, no masses palpated. No hepatosplenomegaly. Bowel sounds positive.  Musculoskeletal: Good range of  motion, no joint swelling or tenderness, Skin: no rashes, lesions, ulcers. No induration Neurologic: CN 2-12 grossly intact. Sensation intact, DTR normal. Strength 5/5 in all 4.  Psychiatric: Normal judgment and insight. Alert and oriented x 3. Normal mood  Data Reviewed:  pH is 7.24, sodium 122, potassium 6.5, chloride 84, CO2 15, glucose 975, BUN 33 and creatinine 1.34, alkaline phosphatase is 1076, lipase of 79 ALT 135 white count 12.6 and hemoglobin 17.9 with platelets 510, urinalysis is negative except for ketones in the urine CT abdomen pelvis  showed stable cystic area in the pancreatic tail about 3.8 cm with significant increase in size of 16 area in the mid tail of the pancreas up to 4.7 cm consistent with pseudocyst large amount of stool throughout the colon  Assessment and Plan:  #1 DKA: Patient will be admitted.  Initiate IV insulin  drip.  Follow the protocol with Q1 as CBG check and every 4 hours BMP checks.  Continue until gap closes.  Patient said he has been taking his insulin .  Not sure what happened.  Will transition to subcutaneous insulin  once the gap is closed.  #2 acute on chronic pancreatitis: Keep patient NPO.  Pain control.  IV fluids and supportive care.  #3 chronic pancreatic insufficiency: Continue pancreatic replacement therapy.  #4 AKI: Hydrate.  Continue to monitor.  #5 essential hypertension: Blood pressure appears controlled.  Continue to monitor.  #6 schizoaffective disorder: Resume medications when ready.  #7 hyperkalemia: Related to DKA.  Continue with insulin  drip  #8 severe dehydration: Continue aggressive hydration.  Continue to monitor.  #9 hyponatremia: Patient has pseudohyponatremia from IV fluids.  Continue to monitor.  #10 anxiety disorder: Continue antianxiety medications.  #11 hyperlipidemia: Will resume home medicines once patient's oral intake resumes.    Advance Care Planning:   Code Status: Full Code   Consults: None  Family Communication: No family at bedside  Severity of Illness: The appropriate patient status for this patient is INPATIENT. Inpatient status is judged to be reasonable and necessary in order to provide the required intensity of service to ensure the patient's safety. The patient's presenting symptoms, physical exam findings, and initial radiographic and laboratory data in the context of their chronic comorbidities is felt to place them at high risk for further clinical deterioration. Furthermore, it is not anticipated that the patient will be medically stable for  discharge from the hospital within 2 midnights of admission.   * I certify that at the point of admission it is my clinical judgment that the patient will require inpatient hospital care spanning beyond 2 midnights from the point of admission due to high intensity of service, high risk for further deterioration and high frequency of surveillance required.*  AuthorBETHA SIM KNOLL, MD 08/11/2023 11:36 PM  For on call review [email or web address].

## 2023-08-11 NOTE — ED Triage Notes (Signed)
Pt reports abdominal pain "probably from my history of pancreatitis and liver bile duct problems"  Also reports weakness in his legs x 2 days. No n/v/d   Reports oxycodone is not helping with the pain.

## 2023-08-11 NOTE — ED Provider Notes (Signed)
 [place] [facility]PARTMENT AT [facility] Provider Note   CSN: [id number] Arrival date & time: 08/11/23  1657     History  Chief Complaint  Patient presents with   Abdominal Pain   Weakness    Steve Romero is a 53 y.o. male.  HPI With a history of chronic pancreatitis presents with abdominal pain, nausea, vomiting.  Patient notes in spite of taking his home narcotics, he continues to have worsening pain including today when pain became uncontrollable. Pain is diffuse across the upper abdomen with associated nausea, anorexia.    Home Medications Prior to Admission medications   Medication Sig Start Date End Date Taking? Authorizing Provider  acetaminophen  (TYLENOL ) 500 MG tablet Take 2 tablets (1,000 mg total) by mouth every 8 (eight) hours as needed for mild pain. 11/23/22  Yes Dasie Leonor CROME, MD  albuterol  (VENTOLIN  HFA) 108 [phone] Base) MCG/ACT inhaler Inhale 2 puffs into the lungs every 6 (six) hours as needed for wheezing or shortness of breath. 07/28/23  Yes Jolinda Potter M, DO  aspirin  EC 81 MG tablet Take 81-162 mg by mouth daily as needed (for pain). Swallow whole.   Yes [provider]  Continuous Glucose Sensor (FREESTYLE LIBRE 3 PLUS SENSOR) MISC Check BGs continuously. E11.9 Change sensor every 15 days. 04/20/23  Yes Gottschalk, Potter M, DO  doxycycline  (DORYX ) 100 MG EC tablet Take 100 mg by mouth 2 (two) times daily. 08/09/23 08/16/23 Yes [provider]  lipase/protease/amylase (CREON ) 36000 UNITS CPEP capsule Take 3 capsules with meals and 1-2  capsule with snacks ( up too 2 snacks ) Patient taking differently: Take 36,000-108,000 Units by mouth See admin instructions. Take 108,000-144,000 units by mouth with each meal eaten and 72,000 units with each snack 01/20/23  Yes Mansouraty, Aloha Raddle., MD  naloxone  (NARCAN ) nasal spray 4 mg/0.1 mL Place 1 spray into the nose as directed. 06/08/23  Yes [provider]  NOVOLOG  RELION  100 UNIT/ML injection Inject 5-15 Units into the skin See admin instructions. Inject 15 units into the skin three times a day with meals and 5-10 units as needed with snacks, depending on BGL reading   Yes [provider]  ondansetron  (ZOFRAN ) 4 MG tablet Take 4 mg by mouth every 6 (six) hours as needed for nausea or vomiting.   Yes [provider]  Oxycodone  HCl 10 MG TABS Take 1 tablet (10 mg total) by mouth every 6 (six) hours as needed. Patient taking differently: Take 10 mg by mouth every 6 (six) hours as needed (for pain). 07/28/23  Yes Jolinda Potter M, DO  rosuvastatin  (CRESTOR ) 10 MG tablet Take 1 tablet (10 mg total) by mouth at bedtime. If you experience muscle aches take every other day Patient taking differently: Take 10 mg by mouth 2 (two) times a week. 02/04/23  Yes Mallipeddi, Vishnu P, MD  SEMGLEE , YFGN, 100 UNIT/ML Pen Inject 70-80 Units into the skin at bedtime. Patient taking differently: Inject 73-75 Units into the skin at bedtime. 07/28/23  Yes Gottschalk, Potter M, DO  Continuous Blood Gluc Receiver (FREESTYLE LIBRE READER) DEVI 1 Units by Does not apply route daily. UAD to test BGs daily. Dx E11.9 09/25/22   Jolinda Potter M, DO  esomeprazole  (NEXIUM ) 20 MG capsule Take 1 capsule (20 mg total) by mouth daily. Patient not taking: Reported on 08/11/2023 03/31/23 03/30/24  Amin, Sumayya, MD  insulin  aspart (NOVOLOG  FLEXPEN) 100 UNIT/ML FlexPen Inject 16 units into the skin twice daily with  breakfast and dinner and 5 units with snacks. Maximum total daily dose 50 units. Patient not taking: Reported on 08/11/2023 07/08/23   Leotis Bogus, MD  Insulin  Pen Needle (BD PEN NEEDLE NANO U/F) 32G X 4 MM MISC UAD to administer insulin  09/25/22   Jolinda Norene HERO, DO      Allergies    Desipramine    Review of Systems   Review of Systems  Physical Exam Updated Vital Signs BP 132/80 (BP Location: Right Arm)   Pulse 72   Temp 98.1 F (36.7 C) (Oral)   Resp 18   Wt  74.4 kg   SpO2 97%   BMI 24.22 kg/m  Physical Exam Vitals and nursing note reviewed.  Constitutional:      General: He is not in acute distress.    Appearance: He is well-developed.  HENT:     Head: Normocephalic and atraumatic.  Eyes:     Conjunctiva/sclera: Conjunctivae normal.  Cardiovascular:     Rate and Rhythm: Normal rate and regular rhythm.  Pulmonary:     Effort: Pulmonary effort is normal. No respiratory distress.     Breath sounds: No stridor.  Abdominal:     General: There is no distension.     Tenderness: There is abdominal tenderness in the epigastric area.  Skin:    General: Skin is warm and dry.  Neurological:     Mental Status: He is alert and oriented to person, place, and time.     ED Results / Procedures / Treatments   Labs (all labs ordered are listed, but only abnormal results are displayed) Labs Reviewed  LIPASE, BLOOD - Abnormal; Notable for the following components:      Result Value   Lipase 79 (*)    All other components within normal limits  COMPREHENSIVE METABOLIC PANEL - Abnormal; Notable for the following components:   Sodium 122 (*)    Potassium 6.5 (*)    Chloride 84 (*)    CO2 15 (*)    Glucose, Bld 975 (*)    BUN 33 (*)    Creatinine, Ser 1.34 (*)    Total Protein 8.2 (*)    ALT 135 (*)    Alkaline Phosphatase 1,076 (*)    Total Bilirubin 1.5 (*)    Anion gap 23 (*)    All other components within normal limits  CBC - Abnormal; Notable for the following components:   WBC 12.6 (*)    RBC 5.88 (*)    Hemoglobin 17.9 (*)    HCT 54.4 (*)    Platelets 510 (*)    All other components within normal limits  URINALYSIS, ROUTINE W REFLEX MICROSCOPIC - Abnormal; Notable for the following components:   Color, Urine COLORLESS (*)    Glucose, UA >=500 (*)    Ketones, ur 20 (*)    All other components within normal limits  CBG MONITORING, ED - Abnormal; Notable for the following components:   Glucose-Capillary >600 (*)    All other  components within normal limits  BASIC METABOLIC PANEL  BASIC METABOLIC PANEL  BASIC METABOLIC PANEL  BASIC METABOLIC PANEL  OSMOLALITY    EKG None  Radiology CT ABDOMEN PELVIS W CONTRAST Result Date: 08/11/2023 CLINICAL DATA:  Acute pancreatitis EXAM: CT ABDOMEN AND PELVIS WITH CONTRAST TECHNIQUE: Multidetector CT imaging of the abdomen and pelvis was performed using the standard protocol following bolus administration of intravenous contrast. RADIATION DOSE REDUCTION: This exam was performed according to the departmental dose-optimization program  which includes automated exposure control, adjustment of the mA and/or kV according to patient size and/or use of iterative reconstruction technique. CONTRAST:  75mL OMNIPAQUE  IOHEXOL  350 MG/ML SOLN COMPARISON:  CT abdomen and pelvis 07/07/2023 FINDINGS: Lower chest: No acute abnormality. Hepatobiliary: Status post cholecystectomy. There is stable mild intra and extrahepatic biliary ductal dilatation. No focal liver lesions are seen. Pancreas: Cystic area in the pancreatic tail measures up to 3.8 cm, unchanged. Cystic area in the mid tail of the pancreas has significantly increased in size now measuring 4.3 x 3.0 x 4.7 cm (previously 1.8 cm). The remaining pancreatic parenchyma appears unchanged from prior. The neck, body and tail are atrophic. No acute inflammatory stranding identified. Visualized portions of the pancreatic head appear enhancing. Spleen: Not visualized, unchanged. Adrenals/Urinary Tract: Adrenal glands are unremarkable. Kidneys are normal, without renal calculi, focal lesion, or hydronephrosis. Bladder is unremarkable. Stomach/Bowel: Stomach is within normal limits. Appendix appears normal. No evidence of bowel wall thickening, distention, or inflammatory changes. There is a large amount of stool throughout the colon. Vascular/Lymphatic: No significant vascular findings are present. No enlarged abdominal or pelvic lymph nodes. Reproductive:  Prostate is unremarkable. Other: No abdominal wall hernia or abnormality. No abdominopelvic ascites. Musculoskeletal: There are degenerative changes at L3-L4 similar to prior. IMPRESSION: 1. No acute localizing process in the abdomen or pelvis. 2. Stable cystic area in the pancreatic tail measuring up to 3.8 cm. 3. Significant increase in size of the cystic area in the mid tail of the pancreas measuring up to 4.7 cm. Findings may represent pseudocysts, but other etiologies are not excluded. 4. Stable mild intra and extrahepatic biliary ductal dilatation status post cholecystectomy. 5. Large amount of stool throughout the colon. Electronically Signed   By: Greig Pique M.D.   On: 08/11/2023 23:16    Procedures Procedures    Medications Ordered in ED Medications  lactated ringers  bolus 1,488 mL (has no administration in time range)  insulin  regular, human (MYXREDLIN ) 100 units/ 100 mL infusion (has no administration in time range)  lactated ringers  infusion (has no administration in time range)  dextrose  5 % in lactated ringers  infusion (has no administration in time range)  dextrose  50 % solution 0-50 mL (has no administration in time range)  HYDROmorphone  (DILAUDID ) injection 1 mg (has no administration in time range)  HYDROmorphone  (DILAUDID ) injection 1 mg (1 mg Intravenous Given 08/11/23 2021)  sodium chloride  0.9 % bolus 1,000 mL (1,000 mLs Intravenous New Bag/Given 08/11/23 2022)  iohexol  (OMNIPAQUE ) 350 MG/ML injection 75 mL (75 mLs Intravenous Contrast Given 08/11/23 2306)    ED Course/ Medical Decision Making/ A&P                                 Medical Decision Making Adult male with chronic pancreatitis, ERP scheduled in 2 months presents with acute exacerbation of pain.  Patient has history of pancreatic pseudocyst, with concern for necrosis versus acute on chronic pancreatitis CT was ordered after initial labs ordered from triage. Patient received analgesics, fluids. Cardiac 85  sinus normal Pulse ox 93 percent borderline  Amount and/or Complexity of Data Reviewed External Data Reviewed: notes. Labs: ordered. Decision-making details documented in ED Course. Radiology: ordered and independent interpretation performed. Decision-making details documented in ED Course.  Risk Prescription drug management. Decision regarding hospitalization. Diagnosis or treatment significantly limited by social determinants of health.   11:20 PM Patient awake, alert, sitting upright, afebrile, hemodynamically unremarkable.  CT scan without acute new phenomenon.  However, the patient's labs are substantially abnormal with findings consistent with glycemia, most consistent with hyperosmolar state but there are ketones, patient meets criteria for DKA.  No altered mental status, which is somewhat reassuring, more consistent with nonketotic hyperosmolar state..  Patient with hyperkalemia, glucose almost 1000, he has begun received fluid resuscitation, starting insulin  drip, will require admission.   Slight delay in obtaining insulin  drip, but the patient is receiving fluids.  Patient aware of all findings, I discussed this case with our internal medicine colleagues for admission.  Final Clinical Impression(s) / ED Diagnoses Final diagnoses:  Diabetic ketoacidosis without coma associated with type 1 diabetes mellitus (HCC)    CRITICAL CARE Performed by: Lamar Salen Total critical care time: 35 minutes Critical care time was exclusive of separately billable procedures and treating other patients. Critical care was necessary to treat or prevent imminent or life-threatening deterioration. Critical care was time spent personally by me on the following activities: development of treatment plan with patient and/or surrogate as well as nursing, discussions with consultants, evaluation of patient's response to treatment, examination of patient, obtaining history from patient or surrogate,  ordering and performing treatments and interventions, ordering and review of laboratory studies, ordering and review of radiographic studies, pulse oximetry and re-evaluation of patient's condition.     Salen Lamar, MD 08/11/23 [phone]

## 2023-08-11 NOTE — ED Notes (Signed)
 Orders have been placed for insulin  drip for pt, RN is unable to access Endotool because it is down with an error message, this RN has made MD, and charge RN aware. RN even called pharmacy to see what the protocols would be if downtime occurred with Endotool, pharmacist reports that she is unaware of any worksheet to send down for nurses to use calculate drip, she also reports she is unable to calculate drip as well. RN has told diplomatic services operational officer and he is going to call IT to see about appx time for it to come back up. RN is unable to start drip related to this situation. Pharmacy [facility] is being called by provider at this time.   Pharmacist from [place] reports that he will secure chat/email our pharmacist the worksheet so she can print and send to use , RN will call back in 10 minutes to confirm she has received it and can send it down.

## 2023-08-12 DIAGNOSIS — E1010 Type 1 diabetes mellitus with ketoacidosis without coma: Secondary | ICD-10-CM | POA: Diagnosis not present

## 2023-08-12 LAB — GLUCOSE, CAPILLARY
Glucose-Capillary: 292 mg/dL — ABNORMAL HIGH (ref 70–99)
Glucose-Capillary: 241 mg/dL — ABNORMAL HIGH (ref 70–99)
Glucose-Capillary: 349 mg/dL — ABNORMAL HIGH (ref 70–99)
Glucose-Capillary: 417 mg/dL — ABNORMAL HIGH (ref 70–99)
Glucose-Capillary: 525 mg/dL (ref 70–99)
Glucose-Capillary: 174 mg/dL — ABNORMAL HIGH (ref 70–99)
Glucose-Capillary: 209 mg/dL — ABNORMAL HIGH (ref 70–99)
Glucose-Capillary: 196 mg/dL — ABNORMAL HIGH (ref 70–99)
Glucose-Capillary: 172 mg/dL — ABNORMAL HIGH (ref 70–99)
Glucose-Capillary: 168 mg/dL — ABNORMAL HIGH (ref 70–99)
Glucose-Capillary: 179 mg/dL — ABNORMAL HIGH (ref 70–99)
Glucose-Capillary: 274 mg/dL — ABNORMAL HIGH (ref 70–99)
Glucose-Capillary: 176 mg/dL — ABNORMAL HIGH (ref 70–99)
Glucose-Capillary: 358 mg/dL — ABNORMAL HIGH (ref 70–99)

## 2023-08-12 LAB — BLOOD GAS, VENOUS
Acid-base deficit: 5.5 mmol/L — ABNORMAL HIGH (ref 0.0–2.0)
Bicarbonate: 22.3 mmol/L (ref 20.0–28.0)
O2 Saturation: 58.9 %
Patient temperature: 37
pCO2, Ven: 52 mm[Hg] (ref 44–60)
pH, Ven: 7.24 — ABNORMAL LOW (ref 7.25–7.43)
pO2, Ven: 34 mm[Hg] (ref 32–45)

## 2023-08-12 LAB — BASIC METABOLIC PANEL
Anion gap: 13 (ref 5–15)
Anion gap: 8 (ref 5–15)
BUN: 29 mg/dL — ABNORMAL HIGH (ref 6–20)
BUN: 26 mg/dL — ABNORMAL HIGH (ref 6–20)
CO2: 22 mmol/L (ref 22–32)
CO2: 25 mmol/L (ref 22–32)
Calcium: 9.3 mg/dL (ref 8.9–10.3)
Calcium: 9 mg/dL (ref 8.9–10.3)
Chloride: 100 mmol/L (ref 98–111)
Chloride: 104 mmol/L (ref 98–111)
Creatinine, Ser: 0.88 mg/dL (ref 0.61–1.24)
Creatinine, Ser: 0.65 mg/dL (ref 0.61–1.24)
GFR, Estimated: >60 mL/min (ref >60)
GFR, Estimated: >60 mL/min (ref >60)
Glucose, Bld: 311 mg/dL — ABNORMAL HIGH (ref 70–99)
Glucose, Bld: 171 mg/dL — ABNORMAL HIGH (ref 70–99)
Potassium: 3.7 mmol/L (ref 3.5–5.1)
Potassium: 3.8 mmol/L (ref 3.5–5.1)
Sodium: 135 mmol/L (ref 135–145)
Sodium: 137 mmol/L (ref 135–145)

## 2023-08-12 LAB — CBC
HCT: 44.5 % (ref 39.0–52.0)
Hemoglobin: 15.6 g/dL (ref 13.0–17.0)
MCH: 31.7 pg (ref 26.0–34.0)
MCHC: 35.1 g/dL (ref 30.0–36.0)
MCV: 90.4 fL (ref 80.0–100.0)
Platelets: 464 10*3/uL — ABNORMAL HIGH (ref 150–400)
RBC: 4.92 MIL/uL (ref 4.22–5.81)
RDW: 12.7 % (ref 11.5–15.5)
WBC: 13.6 10*3/uL — ABNORMAL HIGH (ref 4.0–10.5)
nRBC: 0 % (ref 0.0–0.2)

## 2023-08-12 LAB — OSMOLALITY: Osmolality: 329 mosm/kg (ref 275–295)

## 2023-08-12 LAB — BETA-HYDROXYBUTYRIC ACID: Beta-Hydroxybutyric Acid: 1.79 mmol/L — ABNORMAL HIGH (ref 0.05–0.27)

## 2023-08-12 MED ORDER — PANCRELIPASE (LIP-PROT-AMYL) 12000-38000 UNITS PO CPEP
36000.0000 [IU] | ORAL_CAPSULE | Freq: Three times a day (TID) | ORAL | Status: DC
  Administered 2023-08-12 – 2023-08-13 (×2): 36000 [IU] via ORAL
  Filled 2023-08-12: qty 1
  Filled 2023-08-12 (×2): qty 3
  Filled 2023-08-12: qty 1

## 2023-08-12 MED ORDER — LACTATED RINGERS IV BOLUS: 20.0000 mL/kg | Freq: Once | INTRAVENOUS | Status: AC

## 2023-08-12 MED ORDER — DEXTROSE IN LACTATED RINGERS 5 % IV SOLN: INTRAVENOUS | Status: DC

## 2023-08-12 MED ORDER — LACTATED RINGERS IV SOLN: INTRAVENOUS | Status: DC

## 2023-08-12 MED ORDER — ENOXAPARIN SODIUM 40 MG/0.4ML IJ SOSY
40.0000 mg | PREFILLED_SYRINGE | INTRAMUSCULAR | Status: DC
  Administered 2023-08-12 – 2023-08-13 (×2): 40 mg via SUBCUTANEOUS
  Filled 2023-08-12 (×3): qty 0.4

## 2023-08-12 MED ORDER — INSULIN ASPART 100 UNIT/ML IJ SOLN
0.0000 [IU] | Freq: Every day | INTRAMUSCULAR | Status: DC
  Administered 2023-08-12: 5 [IU] via SUBCUTANEOUS

## 2023-08-12 MED ORDER — INSULIN REGULAR(HUMAN) IN NACL 100-0.9 UT/100ML-% IV SOLN
INTRAVENOUS | Status: AC
  Administered 2023-08-12: 8 [IU]/h via INTRAVENOUS

## 2023-08-12 MED ORDER — INSULIN GLARGINE-YFGN 100 UNIT/ML ~~LOC~~ SOLN
40.0000 [IU] | Freq: Two times a day (BID) | SUBCUTANEOUS | Status: DC
  Administered 2023-08-12 – 2023-08-13 (×3): 40 [IU] via SUBCUTANEOUS
  Filled 2023-08-12 (×4): qty 0.4

## 2023-08-12 MED ORDER — ORAL CARE MOUTH RINSE: 15.0000 mL | OROMUCOSAL | Status: DC | PRN

## 2023-08-12 MED ORDER — INSULIN ASPART 100 UNIT/ML IJ SOLN
0.0000 [IU] | Freq: Three times a day (TID) | INTRAMUSCULAR | Status: DC
  Administered 2023-08-12: 3 [IU] via SUBCUTANEOUS
  Administered 2023-08-12: 8 [IU] via SUBCUTANEOUS
  Administered 2023-08-13: 5 [IU] via SUBCUTANEOUS

## 2023-08-12 MED ORDER — ASPIRIN 81 MG PO TBEC: 81.0000 mg | DELAYED_RELEASE_TABLET | Freq: Every day | ORAL | Status: DC | PRN

## 2023-08-12 MED ORDER — INSULIN ASPART 100 UNIT/ML IJ SOLN
3.0000 [IU] | Freq: Three times a day (TID) | INTRAMUSCULAR | Status: DC
  Administered 2023-08-12 – 2023-08-13 (×3): 3 [IU] via SUBCUTANEOUS

## 2023-08-12 MED ORDER — HYDROMORPHONE HCL 1 MG/ML IJ SOLN
0.5000 mg | INTRAMUSCULAR | Status: AC | PRN
  Administered 2023-08-12 (×3): 0.5 mg via INTRAVENOUS
  Filled 2023-08-12 (×3): qty 1

## 2023-08-12 MED ORDER — OXYCODONE HCL 5 MG PO TABS
10.0000 mg | ORAL_TABLET | ORAL | Status: DC | PRN
  Administered 2023-08-12 – 2023-08-13 (×3): 10 mg via ORAL
  Filled 2023-08-12 (×3): qty 2

## 2023-08-12 MED ORDER — ONDANSETRON HCL 4 MG PO TABS: 4.0000 mg | ORAL_TABLET | Freq: Three times a day (TID) | ORAL | Status: DC | PRN

## 2023-08-12 NOTE — Assessment & Plan Note (Addendum)
 Patient is hungry - Advance diet as tolerated - Resume Creon 

## 2023-08-12 NOTE — Assessment & Plan Note (Addendum)
 Mid tail cyst appears larger in size.  "Findings may represent pseudocysts, but other etiologies are not excluded."  No fevers to suggest abscess formation. - Needs GI follow up

## 2023-08-12 NOTE — Progress Notes (Signed)
  Progress Note   Patient: Steve Romero FMW:[id number] DOB: 05-03-1971 DOA: 08/11/2023     1 DOS: the patient was seen and examined on 08/12/2023 at 8:20 AM      Brief hospital course: 53 y.o. M with hx chronic pancreatitis, hx cholecystectomy 2022 c/b bile leak and post-ERCP necrotizing pancreatitis and pseudocyst, HTN, HLD, COPD, schizoaffective disorder, and IDDM who presented with DKA.     Assessment and Plan: * DKA (diabetic ketoacidosis) (HCC) Gap closed overnight, patient is hungry - Stop insulin  drip - Start subcutaneous insulin , 40 twice daily - Start sliding scale corrections -Consult diabetes education   Pancreatic pseudocyst Mid tail cyst appears larger in size.  Findings may represent pseudocysts, but other etiologies are not excluded.  No fevers to suggest abscess formation. - Needs GI follow up  Acute on chronic pancreatitis [facility]) Patient is hungry - Advance diet as tolerated - Resume Creon   AKI (acute kidney injury) (HCC) Creatinine 1.3 on admission, improved to 0.8/baseline with fluids.  Essential hypertension Blood pressure normal off medication  Hyponatremia Resolved with treatment of DKA  Hyperkalemia Resolved with fluids  COPD (chronic obstructive pulmonary disease) (HCC) No evidence of flare  Schizoaffective disorder (HCC) Does not appear to be on medication for this at baseline  Hyperlipidemia - Resume Crestor  at discharge          Subjective: Patient still has epigastric pain.  He has had no vomiting overnight, no fever, no respiratory symptoms.  He is hungry.  He has no confusion.     Physical Exam: BP 126/75   Pulse 64   Temp 97.7 F (36.5 C) (Oral)   Resp 12   Wt 67.1 kg   SpO2 98%   BMI 21.85 kg/m   Thin adult male, lying in bed, interactive and appropriate RRR, no murmurs, no peripheral edema Respiratory rate normal, lungs clear without rales or wheezes Abdomen soft, voluntary guarding throughout Attention  normal, affect appropriate, judgment and insight appear normal    Data Reviewed: Basic metabolic panel shows sodium and potassium resolved to normal, creatinine down to 0.8 AST slightly elevated, lipase in the 70s, alk phos extremely elevated Repeat BMP shows resolution of anion gap CT abdomen and pelvis shows a stable 3 cm tail cyst, and increased size of a mid tail cyst  Family Communication: None present    Disposition: Status is: Inpatient         Author: Lonni SHAUNNA Dalton, MD 08/12/2023 11:48 AM  For on call review [email or web address].

## 2023-08-12 NOTE — Assessment & Plan Note (Signed)
-   Resume Crestor at discharge

## 2023-08-12 NOTE — Plan of Care (Signed)
  Problem: Clinical Measurements: Goal: Cardiovascular complication will be avoided Outcome: Progressing   Problem: Activity: Goal: Risk for activity intolerance will decrease Outcome: Progressing   Problem: Coping: Goal: Level of anxiety will decrease Outcome: Progressing   Problem: Elimination: Goal: Will not experience complications related to urinary retention Outcome: Progressing   Problem: Safety: Goal: Ability to remain free from injury will improve Outcome: Progressing   Problem: Skin Integrity: Goal: Risk for impaired skin integrity will decrease Outcome: Progressing   Problem: Metabolic: Goal: Ability to maintain appropriate glucose levels will improve Outcome: Progressing   Problem: Skin Integrity: Goal: Risk for impaired skin integrity will decrease Outcome: Progressing   Problem: Tissue Perfusion: Goal: Adequacy of tissue perfusion will improve Outcome: Progressing   Problem: Respiratory: Goal: Will regain and/or maintain adequate ventilation Outcome: Progressing

## 2023-08-12 NOTE — Assessment & Plan Note (Signed)
 Does not appear to be on medication for this at baseline

## 2023-08-12 NOTE — Assessment & Plan Note (Signed)
 Resolved with treatment of DKA

## 2023-08-12 NOTE — Hospital Course (Signed)
 53 y.o. M with hx chronic pancreatitis, hx cholecystectomy 2022 c/b bile leak and post-ERCP necrotizing pancreatitis and pseudocyst, HTN, HLD, COPD, schizoaffective disorder, and IDDM who presented with DKA.

## 2023-08-12 NOTE — Assessment & Plan Note (Addendum)
 Gap closed overnight, patient is hungry - Stop insulin  drip - Start subcutaneous insulin , 40 twice daily - Start sliding scale corrections -Consult diabetes education

## 2023-08-12 NOTE — Assessment & Plan Note (Signed)
 Creatinine 1.3 on admission, improved to 0.8/baseline with fluids.

## 2023-08-12 NOTE — Inpatient Diabetes Management (Signed)
 Inpatient Diabetes Program Recommendations  AACE/ADA: New Consensus Statement on Inpatient Glycemic Control (2015)  Target Ranges:  Prepandial:   less than 140 mg/dL      Peak postprandial:   less than 180 mg/dL (1-2 hours)      Critically ill patients:  140 - 180 mg/dL   Lab Results  Component Value Date   GLUCAP 179 (H) 08/12/2023   HGBA1C >14.0 (H) 06/08/2023    Review of Glycemic Control  Diabetes history: DM1 Outpatient Diabetes medications: Lantus  73 units at bedtime (divides dose into 3or 4 injections instead of taking all of it at bedtime) Current orders for Inpatient glycemic control: IV insulin  drip per EndoTool for DKA Transitioned to Lantus  40 BID, Novolog  0-15 TID with meals and 0-5 HS + 3 units TID  HgbA1C - > 14.0% Received Semglee  40 units at 1107.  Inpatient Diabetes Program Recommendations:    Consider increasing Novolog  meal coverage to 6 units TID with meals if eating > 50%.  Needs Endocrinology appt ASAP.  Recently saw pt on 07/08/23 during last hospitalization. PCP has titrated insulin  to above levels and pt states he still stays high. Saw pt today at bedside. Has lost weight and states he never misses his insulin  doses. He did say that he takes the Lantus  in 3 doses: 20 in am, 20 around lunch and 33 at night. He thought it may work better if he spread out the dose.  Pt very frustrated with not being able to control his blood sugars. Said he's not drinking juice or sodas.  Will f/u with PCP and take CGM for review. Endo consult is critical.  Will follow.  Thank you. Shona Brandy, RD, LDN, CDCES Inpatient Diabetes Coordinator [phone]

## 2023-08-12 NOTE — Assessment & Plan Note (Signed)
 Blood pressure normal off medication

## 2023-08-12 NOTE — Assessment & Plan Note (Signed)
Resolved with fluids °

## 2023-08-12 NOTE — Assessment & Plan Note (Signed)
No evidence of flare 

## 2023-08-13 DIAGNOSIS — E1010 Type 1 diabetes mellitus with ketoacidosis without coma: Secondary | ICD-10-CM | POA: Diagnosis not present

## 2023-08-13 LAB — BASIC METABOLIC PANEL
Anion gap: 8 (ref 5–15)
BUN: 18 mg/dL (ref 6–20)
CO2: 26 mmol/L (ref 22–32)
Calcium: 8.4 mg/dL — ABNORMAL LOW (ref 8.9–10.3)
Chloride: 98 mmol/L (ref 98–111)
Creatinine, Ser: 0.54 mg/dL — ABNORMAL LOW (ref 0.61–1.24)
GFR, Estimated: >60 mL/min (ref >60)
Glucose, Bld: 306 mg/dL — ABNORMAL HIGH (ref 70–99)
Potassium: 3.5 mmol/L (ref 3.5–5.1)
Sodium: 132 mmol/L — ABNORMAL LOW (ref 135–145)

## 2023-08-13 LAB — GLUCOSE, CAPILLARY: Glucose-Capillary: 208 mg/dL — ABNORMAL HIGH (ref 70–99)

## 2023-08-13 MED ORDER — SEMGLEE (YFGN) 100 UNIT/ML ~~LOC~~ SOPN: 40.0000 [IU] | PEN_INJECTOR | Freq: Two times a day (BID) | SUBCUTANEOUS | 11 refills | Status: DC

## 2023-08-13 NOTE — Progress Notes (Signed)
 Transition of Care [facility]) - Inpatient Brief Assessment   Patient Details  Name: Steve Romero MRN: [id number] Date of Birth: [birth year]  [facility] CM/SW Contact:    Sonda Manuella Quill, RN Phone Number: 08/13/2023, 9:53 AM   Clinical Narrative: home   Transition of Care Asessment: Insurance and Status: Insurance coverage has been reviewed Patient has primary care physician: Yes Home environment has been reviewed: yes Prior level of function:: independent Prior/Current Home Services: No current home services Social Drivers of Health Review: SDOH reviewed no interventions necessary Readmission risk has been reviewed: Yes Transition of care needs: no transition of care needs at this time

## 2023-08-13 NOTE — Plan of Care (Signed)
 ?  Problem: Clinical Measurements: ?Goal: Will remain free from infection ?Outcome: Progressing ?  ?

## 2023-08-13 NOTE — Discharge Summary (Signed)
 Physician Discharge Summary   Patient: Steve Romero MRN: [id number] DOB: [birth year]  Admit date:     08/11/2023  Discharge date: 08/13/23  Discharge Physician: Lonni SHAUNNA Dalton   PCP: Jolinda Norene HERO, DO     Recommendations at discharge:  Follow up with PCP Dr. Jolinda in 1 week for DKA, diabetes Follow up with Dr. Wilhelmenia for pancreatic pseudocyst      Discharge Diagnoses: Principal Problem:   DKA (diabetic ketoacidosis) (HCC) Active Problems:   Chronic pancreatitis (HCC)   Pancreatic pseudocyst   AKI (acute kidney injury) (HCC)   Essential hypertension   Hyperlipidemia   Schizoaffective disorder (HCC)   Anxiety state   COPD (chronic obstructive pulmonary disease) (HCC)   Hyperkalemia   Hyponatremia      Hospital Course: 53 y.o. M with hx chronic pancreatitis, hx cholecystectomy 2022 c/b bile leak and post-ERCP necrotizing pancreatitis and pseudocyst, HTN, HLD, COPD, schizoaffective disorder, and IDDM who presented with increasing abdominal pain, found to have DKA.      * DKA (diabetic ketoacidosis) (HCC) Acute metabolic acidosis due to DKA Glucose 975 on admission, AG 23, bicarb 15.  Started on insulin  drip and admitted.  Acidosis resolved, gap closed overnight, transitioned to subcutaneous insulin .    Tolerated PO diet, no vomiting, able to maintain normoglycemia with hospital insulin  regimen (40 glargine BID, ~5 units aspart TID)  Patient reported poor compliance to insulin  at home.  Claims he does check his sugar at home regularly, just that the reader will just say HIGH.   Pancreatic pseudocyst Mid tail cyst appears larger in size.  Findings may represent pseudocysts, but other etiologies are not excluded.  No fevers to suggest abscess formation.  Here, he had no obstructive symptoms. However, it was noted he has lost about 20 kg in the last year, steadily.  I discussed with [facility] by phone, in the absence of obstructive  symptoms, no EUS indicated at this time, they recommend outpatient follow up. - Needs GI follow up  Chronic pancreatitis (HCC) CT abdomen showed no signs of stranding around the pancreas, and he had no food intolerance.  Acute pancreatitis ruled out.  AKI (acute kidney injury) (HCC) Creatinine 1.3 on admission, improved to 0.8/baseline with fluids.           The   Controlled Substances Registry was reviewed for this patient prior to discharge.    Procedures performed:  None  Disposition: Home   DISCHARGE MEDICATION: Allergies as of 08/13/2023       Reactions   Desipramine Nausea Only, Rash        Medication List     STOP taking these medications    doxycycline  100 MG EC tablet Commonly known as: DORYX        TAKE these medications    acetaminophen  500 MG tablet Commonly known as: TYLENOL  Take 2 tablets (1,000 mg total) by mouth every 8 (eight) hours as needed for mild pain.   albuterol  108 (90 Base) MCG/ACT inhaler Commonly known as: VENTOLIN  HFA Inhale 2 puffs into the lungs every 6 (six) hours as needed for wheezing or shortness of breath.   aspirin  EC 81 MG tablet Take 81-162 mg by mouth daily as needed (for pain). Swallow whole.   BD Pen Needle Nano U/F 32G X 4 MM Misc Generic drug: Insulin  Pen Needle UAD to administer insulin    esomeprazole  20 MG capsule Commonly known as: NexIUM  Take 1 capsule (20 mg total) by mouth daily.   FreeStyle [employer]  3 Plus Sensor Misc Check BGs continuously. E11.9 Change sensor every 15 days.   FreeStyle [employer] 1 Units by Does not apply route daily. UAD to test BGs daily. Dx E11.9   lipase/protease/amylase 63999 UNITS Cpep capsule Commonly known as: Creon  Take 3 capsules with meals and 1-2  capsule with snacks ( up too 2 snacks ) What changed:  how much to take how to take this when to take this additional instructions   naloxone  4 MG/0.1ML Liqd nasal spray kit Commonly known as:  NARCAN  Place 1 spray into the nose as directed.   NovoLOG  ReliOn 100 UNIT/ML injection Generic drug: insulin  aspart Inject 5-15 Units into the skin See admin instructions. Inject 15 units into the skin three times a day with meals and 5-10 units as needed with snacks, depending on BGL reading What changed: Another medication with the same name was removed. Continue taking this medication, and follow the directions you see here.   ondansetron  4 MG tablet Commonly known as: ZOFRAN  Take 4 mg by mouth every 6 (six) hours as needed for nausea or vomiting.   Oxycodone  HCl 10 MG Tabs Take 1 tablet (10 mg total) by mouth every 6 (six) hours as needed. What changed: reasons to take this   rosuvastatin  10 MG tablet Commonly known as: CRESTOR  Take 1 tablet (10 mg total) by mouth at bedtime. If you experience muscle aches take every other day What changed:  when to take this additional instructions   Semglee  (yfgn) 100 UNIT/ML Pen Generic drug: insulin  glargine-yfgn Inject 40 Units into the skin 2 (two) times daily. What changed:  how much to take when to take this        Follow-up Information     Jolinda Norene HERO, DO. Schedule an appointment as soon as possible for a visit in 1 week(s).   Specialty: Family Medicine Contact information: [address] [place] [address] [phone]         Mansouraty, Aloha Raddle., MD. Call.   Specialties: Gastroenterology, Internal Medicine Why: Call for instructions on the timing of follow up Contact information: [address] [place] [address] [phone]                 Discharge Instructions     Discharge instructions   Complete by: As directed    **IMPORTANT DISCHARGE INSTRUCTIONS**   From Dr. Jonel: You were admitted for DKA This happened because of uncontrolled blood sugar  You should adjust your glargine (long-acting) insulin  to be 40 units twice daily  Continue your Relion Novolog  three times daily  with meals  Your sugar should not read HIGH, you should keep the range in the 90-180 mg/dL range   Increase activity slowly   Complete by: As directed        Discharge Exam: Filed Weights   08/11/23 2300 08/12/23 0025 08/12/23 1500  Weight: 74.4 kg 67.1 kg 67.1 kg    General: Pt is alert, awake, not in acute distress Cardiovascular: RRR, nl S1-S2, no murmurs appreciated.   No LE edema.   Respiratory: Normal respiratory rate and rhythm.  CTAB without rales or wheezes. Abdominal: Abdomen with diffuse mild tenderness and guarding.  No distension or HSM.   Neuro/Psych: Strength symmetric in upper and lower extremities.  Judgment and insight appear normal.   Condition at discharge: fair  The results of significant diagnostics from this hospitalization (including imaging, microbiology, ancillary and laboratory) are listed below for reference.   Imaging  Studies: CT ABDOMEN PELVIS W CONTRAST Result Date: 08/11/2023 CLINICAL DATA:  Acute pancreatitis EXAM: CT ABDOMEN AND PELVIS WITH CONTRAST TECHNIQUE: Multidetector CT imaging of the abdomen and pelvis was performed using the standard protocol following bolus administration of intravenous contrast. RADIATION DOSE REDUCTION: This exam was performed according to the departmental dose-optimization program which includes automated exposure control, adjustment of the mA and/or kV according to patient size and/or use of iterative reconstruction technique. CONTRAST:  75mL OMNIPAQUE  IOHEXOL  350 MG/ML SOLN COMPARISON:  CT abdomen and pelvis 07/07/2023 FINDINGS: Lower chest: No acute abnormality. Hepatobiliary: Status post cholecystectomy. There is stable mild intra and extrahepatic biliary ductal dilatation. No focal liver lesions are seen. Pancreas: Cystic area in the pancreatic tail measures up to 3.8 cm, unchanged. Cystic area in the mid tail of the pancreas has significantly increased in size now measuring 4.3 x 3.0 x 4.7 cm (previously 1.8 cm). The  remaining pancreatic parenchyma appears unchanged from prior. The neck, body and tail are atrophic. No acute inflammatory stranding identified. Visualized portions of the pancreatic head appear enhancing. Spleen: Not visualized, unchanged. Adrenals/Urinary Tract: Adrenal glands are unremarkable. Kidneys are normal, without renal calculi, focal lesion, or hydronephrosis. Bladder is unremarkable. Stomach/Bowel: Stomach is within normal limits. Appendix appears normal. No evidence of bowel wall thickening, distention, or inflammatory changes. There is a large amount of stool throughout the colon. Vascular/Lymphatic: No significant vascular findings are present. No enlarged abdominal or pelvic lymph nodes. Reproductive: Prostate is unremarkable. Other: No abdominal wall hernia or abnormality. No abdominopelvic ascites. Musculoskeletal: There are degenerative changes at L3-L4 similar to prior. IMPRESSION: 1. No acute localizing process in the abdomen or pelvis. 2. Stable cystic area in the pancreatic tail measuring up to 3.8 cm. 3. Significant increase in size of the cystic area in the mid tail of the pancreas measuring up to 4.7 cm. Findings may represent pseudocysts, but other etiologies are not excluded. 4. Stable mild intra and extrahepatic biliary ductal dilatation status post cholecystectomy. 5. Large amount of stool throughout the colon. Electronically Signed   By: Greig Pique M.D.   On: 08/11/2023 23:16    Microbiology: Results for orders placed or performed during the hospital encounter of 11/12/22  Surgical pcr screen     Status: None   Collection Time: 11/17/22  1:34 AM   Specimen: Nasal Mucosa; Nasal Swab  Result Value Ref Range Status   MRSA, PCR NEGATIVE NEGATIVE Final   Staphylococcus aureus NEGATIVE NEGATIVE Final    Comment: (NOTE) The Xpert SA Assay (FDA approved for NASAL specimens in patients 26 years of age and older), is one component of a comprehensive surveillance program. It is  not intended to diagnose infection nor to guide or monitor treatment. Performed at [facility], [address], [place], [place] [address]     Labs: CBC: Recent Labs  Lab 08/11/23 1735 08/12/23 0257  WBC 12.6* 13.6*  HGB 17.9* 15.6  HCT 54.4* 44.5  MCV 92.5 90.4  PLT 510* 464*   Basic Metabolic Panel: Recent Labs  Lab 08/11/23 1735 08/12/23 0257 08/12/23 0846 08/13/23 0450  NA 122* 135 137 132*  K 6.5* 3.7 3.8 3.5  CL 84* 100 104 98  CO2 15* 22 25 26   GLUCOSE 975* 311* 171* 306*  BUN 33* 29* 26* 18  CREATININE 1.34* 0.88 0.65 0.54*  CALCIUM  9.8 9.3 9.0 8.4*   Liver Function Tests: Recent Labs  Lab 08/11/23 1735  AST 19  ALT 135*  ALKPHOS 1,076*  BILITOT 1.5*  PROT 8.2*  ALBUMIN  4.1   CBG: Recent Labs  Lab 08/12/23 1028 08/12/23 1150 08/12/23 1624 08/12/23 2242 08/13/23 0721  GLUCAP 168* 179* 274* 358* 208*    Discharge time spent: approximately 45 minutes spent on discharge counseling, evaluation of patient on day of discharge, and coordination of discharge planning with nursing, social work, pharmacy and case management  Signed: Lonni SHAUNNA Dalton, MD [facility] 08/13/2023

## 2023-08-13 NOTE — Progress Notes (Signed)
 Discharge instructions discussed with patient, verbalized agreement and understanding

## 2023-08-13 NOTE — Inpatient Diabetes Management (Signed)
 Inpatient Diabetes Program Recommendations  AACE/ADA: New Consensus Statement on Inpatient Glycemic Control (2015)  Target Ranges:  Prepandial:   less than 140 mg/dL      Peak postprandial:   less than 180 mg/dL (1-2 hours)      Critically ill patients:  140 - 180 mg/dL   Lab Results  Component Value Date   GLUCAP 208 (H) 08/13/2023   HGBA1C >14.0 (H) 06/08/2023    Review of Glycemic Control  Diabetes history: DM1  Outpatient Diabetes medications: Lantus  73 units at bedtime (divides dose into 3or 4 injections instead of taking all of it at bedtime) Current orders for Inpatient glycemic control: Semglee  40 BID, Novolog  0-15 TID with meals and 0-5 HS + 3 units TID  CBGs:  306, 208  Inpatient Diabetes Program Recommendations:   Continue titration of Semglee  and Novolog :  Consider increasing Semglee  to 42 units BID Consider increasing Novolog  to 6 units TID with meals.  Obtain Endo Consult prior to discharge  Follow.  Thank you. Shona Brandy, RD, LDN, CDCES Inpatient Diabetes Coordinator [phone]

## 2023-08-16 ENCOUNTER — Telehealth: Payer: Self-pay | Admitting: *Deleted

## 2023-08-16 NOTE — Transitions of Care (Post Inpatient/ED Visit) (Signed)
 08/16/2023  Name: Steve Romero MRN: [id number] DOB: Mar 07, 1971  Today's TOC FU Call Status: Today's TOC FU Call Status:: Successful TOC FU Call Completed TOC FU Call Complete Date: 08/16/23 Patient's Name and Date of Birth confirmed.  Transition Care Management Follow-up Telephone Call Date of Discharge: 08/13/23 Discharge Facility: [facility]) Type of Discharge: Inpatient Admission Primary Inpatient Discharge Diagnosis:: DKA (diabetic ketoacidosis) How have you been since you were released from the hospital?:  (eating well "just can't seem to gain weight with this pancreas issue",  no issues w/ bowel/ bladder, ambulating well) Any questions or concerns?: No  Items Reviewed: Did you receive and understand the discharge instructions provided?: Yes Any new allergies since your discharge?: No Dietary orders reviewed?: Yes Type of Diet Ordered:: carb modified, heart healthy Do you have support at home?: Yes People in Home: parent(s) Name of Support/Comfort Primary Source: parents  Remijio and Remonia Carmin Rzasa Patient gave consent for enrollment in TOC 30 day program   Goals Addressed             This Visit's Progress    Transition of Care/ pt will have no readmissions wtihin 30 days       Current Barriers:  Provider appointments pt does not have post hospital primary care provider appointment, states he will call today and schedule appointment, he did not want the appointment given by the care guide for 2/12 Knowledge Deficits related to plan of care for management of DMII  Pt states he has a pancreatic pseudocyst and "part of my pancreas removed and this is what's making blood sugar worse" Patient's CBG monitored by [facility],  reading this morning 176,  weight 160 pounds, pt does not want to lose anymore weight  RNCM Clinical Goal(s):  Patient will work with the Care Management team over the next 30 days to address Transition of Care Barriers: pt does not have post  hospital primary care provider appointment verbalize understanding of plan for management of DMII as evidenced by patient report, review of EMR and  through collaboration with RN Care manager, provider, and care team.   Interventions: Evaluation of current treatment plan related to  self management and patient's adherence to plan as established by provider   Diabetes Interventions:  (Status:  New goal. and Goal on track:  Yes.) Short Term Goal Assessed patient's understanding of A1c goal: <7% Provided education to patient about basic DM disease process Reviewed medications with patient and discussed importance of medication adherence Counseled on importance of regular laboratory monitoring as prescribed Discussed plans with patient for ongoing care management follow up and provided patient with direct contact information for care management team Reviewed scheduled/upcoming provider appointments including: endocrinologist Komal Motwani on 09/17/23 @ 940 am, labs on 09/14/23 @ 945 am Review of patient status, including review of consultants reports, relevant laboratory and other test results, and medications completed Assessed social determinant of health barriers Reviewed plate method, importance of being mindful of carbohydrate intake at each meal Reviewed importance of adequate protein at each meal Lab Results  Component Value Date   HGBA1C >14.0 (H) 06/08/2023    Patient Goals/Self-Care Activities: Participate in Transition of Care Program/Attend [facility] scheduled calls Notify RN Care Manager of [facility] call rescheduling needs Take all medications as prescribed Attend all scheduled provider appointments Call pharmacy for medication refills 3-7 days in advance of running out of medications Attend church or other social activities Perform all self care activities independently  Call provider office for  new concerns or questions  check blood sugar at prescribed times: Freestyle LIbre check feet  daily for cuts, sores or redness take the blood sugar log to all doctor visits trim toenails straight across fill half of plate with vegetables prepare main meal at home 3 to 5 days each week wash and dry feet carefully every day wear comfortable, cotton socks wear comfortable, well-fitting shoes  Follow Up Plan:  Telephone follow up appointment with care management team member scheduled for:  08/23/23 @ 10 am           Medications Reviewed Today: Medications Reviewed Today     Reviewed by Daralyn Earl, RN (Registered Nurse) on 08/16/23 at 1409  Med List Status: <None>   Medication Order Taking? Sig Documenting Provider Last Dose Status Informant  acetaminophen  (TYLENOL ) 500 MG tablet [id number] Yes Take 2 tablets (1,000 mg total) by mouth every 8 (eight) hours as needed for mild pain. Lujean Sake, MD Taking Active Self  albuterol  (VENTOLIN  HFA) 108 [phone] Base) MCG/ACT inhaler [id number] Yes Inhale 2 puffs into the lungs every 6 (six) hours as needed for wheezing or shortness of breath. Eliodoro Guerin, DO Taking Active Self  aspirin  EC 81 MG tablet [id number] Yes Take 81-162 mg by mouth daily as needed (for pain). Swallow whole. [provider] Taking Active Self  Continuous Blood Gluc Receiver (FREESTYLE LIBRE READER) DEVI [id number] Yes 1 Units by Does not apply route daily. UAD to test BGs daily. Dx E11.9 Eliodoro Guerin, DO Taking Active   Continuous Glucose Sensor (FREESTYLE LIBRE 3 PLUS SENSOR) MISC [id number] No Check BGs continuously. E11.9 Change sensor every 15 days.  Patient not taking: Reported on 08/16/2023   Eliodoro Guerin, DO Not Taking Active Self           Med Note Kipp People, Chrislynn Mosely A   Mon Aug 16, 2023  2:07 PM) Pt states he will call [employer] today to get sensors  esomeprazole  (NEXIUM ) 20 MG capsule [id number] Yes Take 1 capsule (20 mg total) by mouth daily. Luna Salinas, MD Taking Active Self           Med Note Guido Leeks, Lavonia Powers   Wed Aug 11, 2023   8:40 PM)    Insulin  Pen Needle (BD PEN NEEDLE NANO U/F) 32G X 4 MM MISC [id number] Yes UAD to administer insulin  Eliodoro Guerin, DO Taking Active   lipase/protease/amylase (CREON ) 36000 UNITS CPEP capsule [id number] Yes Take 3 capsules with meals and 1-2  capsule with snacks ( up too 2 snacks )  Patient taking differently: Take 36,000-108,000 Units by mouth See admin instructions. Take 108,000-144,000 units by mouth with each meal eaten and 72,000 units with each snack   Mansouraty, Albino Alu., MD Taking Active Self  naloxone  (NARCAN ) nasal spray 4 mg/0.1 mL [id number] No Place 1 spray into the nose as directed.  Patient not taking: Reported on 08/16/2023   [provider] Not Taking Active Self  NOVOLOG  RELION 100 UNIT/ML injection [id number] Yes Inject 5-15 Units into the skin See admin instructions. Inject 15 units into the skin three times a day with meals and 5-10 units as needed with snacks, depending on BGL reading [provider] Taking Active Self  ondansetron  (ZOFRAN ) 4 MG tablet [id number] Yes Take 4 mg by mouth every 6 (six) hours as needed for nausea or vomiting. [provider] Taking Active Self  Oxycodone  HCl 10 MG TABS [id number] Yes Take 1 tablet (10 mg total) by mouth  every 6 (six) hours as needed.  Patient taking differently: Take 10 mg by mouth every 6 (six) hours as needed (for pain).   Vicky Grange M, DO Taking Active Self  rosuvastatin  (CRESTOR ) 10 MG tablet [id number] Yes Take 1 tablet (10 mg total) by mouth at bedtime. If you experience muscle aches take every other day  Patient taking differently: Take 10 mg by mouth 2 (two) times a week.   Mallipeddi, Kennyth Pean, MD Taking Active Self           Med Note Mliss Anderson RODRIGUEZ, SUSAN A   Wed Jul 07, 2023  8:16 PM)    SEMGLEE , YFGN, 100 UNIT/ML Pen [id number] Yes Inject 40 Units into the skin 2 (two) times daily. Ephriam Hashimoto, MD Taking Active             Home Care and  Equipment/Supplies: Were Home Health Services Ordered?: No Any new equipment or medical supplies ordered?: No  Functional Questionnaire: Do you need assistance with bathing/showering or dressing?: No Do you need assistance with meal preparation?: No Do you need assistance with eating?: No Do you have difficulty maintaining continence: No Do you need assistance with getting out of bed/getting out of a chair/moving?: No Do you have difficulty managing or taking your medications?: No  Follow up appointments reviewed: PCP Follow-up appointment confirmed?: No (RN care manager collaborated w/ care guide and made appt for 2/12/ at 8 am with different provider,  pt does not want this appt, states he will call himself and work out with his dad who will be transporting him) MD Provider Line Number:[phone] Given: No Specialist Hospital Follow-up appointment confirmed?: Yes Date of Specialist follow-up appointment?: 09/17/23 Follow-Up Specialty Provider:: Endocrinologist  Komal Motwani  @ 940 am   Labs at same location 09/14/23  @ 945 am, pt will call GI Dr. Brice Campi and scheduled post hospital f/u, to have endoscopy on 09/23/23 with results to Dr. Brice Campi Do you need transportation to your follow-up appointment?:  (patient's father provides transportation) Do you understand care options if your condition(s) worsen?: Yes-patient verbalized understanding  SDOH Interventions Today    Flowsheet Row Most Recent Value  SDOH Interventions   Food Insecurity Interventions Intervention Not Indicated  Housing Interventions Intervention Not Indicated  Transportation Interventions Intervention Not Indicated  Utilities Interventions Intervention Not Indicated       Cecilie Coffee [facility], BSN RN Care Manager/ Transition of Care [place]/ [facility] [phone]

## 2023-08-18 ENCOUNTER — Ambulatory Visit: Payer: MEDICAID | Admitting: Family Medicine

## 2023-08-18 ENCOUNTER — Encounter: Payer: Self-pay | Admitting: Family Medicine

## 2023-08-18 VITALS — BP 111/70 | HR 69 | Temp 96.8°F | Ht 69.0 in | Wt 166.8 lb

## 2023-08-18 DIAGNOSIS — E878 Other disorders of electrolyte and fluid balance, not elsewhere classified: Secondary | ICD-10-CM | POA: Diagnosis not present

## 2023-08-18 DIAGNOSIS — Z794 Long term (current) use of insulin: Secondary | ICD-10-CM | POA: Diagnosis not present

## 2023-08-18 DIAGNOSIS — E119 Type 2 diabetes mellitus without complications: Secondary | ICD-10-CM

## 2023-08-18 DIAGNOSIS — E0810 Diabetes mellitus due to underlying condition with ketoacidosis without coma: Secondary | ICD-10-CM | POA: Diagnosis not present

## 2023-08-18 DIAGNOSIS — K863 Pseudocyst of pancreas: Secondary | ICD-10-CM | POA: Diagnosis not present

## 2023-08-18 NOTE — Progress Notes (Addendum)
Subjective:  Patient ID: Steve Romero, male    DOB: 1971/01/06, 53 y.o.   MRN: [id number]  Patient Care Team: Raliegh Ip, DO as PCP - General (Family Medicine) Marjo Bicker, MD as PCP - Cardiology (Cardiology) Mansouraty, Netty Starring., MD as Consulting Physician (Gastroenterology) Fritzi Mandes, MD as Consulting Physician (General Surgery) Serena Colonel, MD as Consulting Physician (Otolaryngology) Audrie Gallus, RN as [facility] Care Management   Chief Complaint:  Hospitalization Follow-up (08/11/2023 - 08/13/2023 (2 days)/[facility]- DKA)  HPI: Steve Romero is a 53 y.o. male presenting on 08/18/2023 for Hospitalization Follow-up (08/11/2023 - 08/13/2023 (2 days)/[facility]- DKA)  Patient has PMH of chronic pancreatitis, hx cholecystectomy 2022 c/b bile leak and post-ERCP necrotizing pancreatitis and pseudocyst, HTN, HLD, COPD, schizoaffective disorder and Diabetes who presented to hospital on 08/11/2023 with abdominal pain, N/V. and was treated for DKA. He was discharged on 08/13/2023 and instructed to follow up with PCP and GI specialist for pancreatic pseudocyst.  States that he is doing okay since coming home. States that he is "eating right" and taking insulin as prescribed. He is using accucheck currently. He states that he is going to [employer] to get sensors today. He has not been using it since hospitalization. States that BG runs 170-400s at home. States that he is taking 80 units of long acting, Semglee total. States that he is doing 40 units around noon each day and 40 units at 8-9 pm. Denies confusion. States that N/V comes and goes. States that pain has improved.   Relevant past medical, surgical, family, and social history reviewed and updated as indicated.  Allergies and medications reviewed and updated. Data reviewed: Chart in Epic.   Past Medical History:  Diagnosis Date   Acute pancreatitis without necrosis or infection,  unspecified    AKI (acute kidney injury) (HCC) 05/17/2022   Anxiety 10/2014   Ascites    Bipolar disorder [facility]) age 77   COPD (chronic obstructive pulmonary disease) (HCC) 2016   Depression age 72   Enlarged prostate    Hyperlipidemia 2016   Hypertension 2013   Nausea vomiting and diarrhea 06/15/2022   Schizoaffective disorder (HCC) 11/13/2014   Sleep apnea    Does not wear c-pap, sleeps in sitting up position per pt   Thyroid disease 11/2014    Past Surgical History:  Procedure Laterality Date   BALLOON DILATION N/A 11/07/2020   Procedure: BALLOON DILATION;  Surgeon: Lemar Lofty., MD;  Location: [facility] ENDOSCOPY;  Service: Gastroenterology;  Laterality: N/A;   BALLOON DILATION N/A 06/26/2021   Procedure: BALLOON DILATION;  Surgeon: Meridee Score Netty Starring., MD;  Location: [facility] ENDOSCOPY;  Service: Gastroenterology;  Laterality: N/A;   BILIARY DILATION  07/24/2021   Procedure: BILIARY DILATION;  Surgeon: Meridee Score Netty Starring., MD;  Location: [facility] ENDOSCOPY;  Service: Gastroenterology;;   BILIARY DILATION  05/22/2022   Procedure: BILIARY DILATION;  Surgeon: Lemar Lofty., MD;  Location: [facility] ENDOSCOPY;  Service: Gastroenterology;;   BILIARY DILATION  06/16/2022   Procedure: GASTROSTOMY DILATION;  Surgeon: Lemar Lofty., MD;  Location: [facility] ENDOSCOPY;  Service: Gastroenterology;;   BILIARY STENT PLACEMENT N/A 10/10/2020   Procedure: BILIARY STENT PLACEMENT;  Surgeon: Malissa Hippo, MD;  Location: [facility];  Service: Endoscopy;  Laterality: N/A;   BILIARY STENT PLACEMENT  11/09/2020   Procedure: BILIARY STENT PLACEMENT;  Surgeon: Meridee Score Netty Starring., MD;  Location: [facility] ENDOSCOPY;  Service: Gastroenterology;;   BILIARY STENT PLACEMENT  11/07/2020   Procedure: BILIARY STENT PLACEMENT;  Surgeon: Lemar Lofty., MD;  Location: [facility] ENDOSCOPY;  Service: Gastroenterology;;   BILIARY STENT PLACEMENT  11/11/2020   Procedure: BILIARY STENT PLACEMENT;  Surgeon:  Lemar Lofty., MD;  Location: [facility] ENDOSCOPY;  Service: Gastroenterology;;   BILIARY STENT PLACEMENT  11/14/2020   Procedure: BILIARY STENT PLACEMENT;  Surgeon: Lemar Lofty., MD;  Location: [facility] ENDOSCOPY;  Service: Gastroenterology;;   BILIARY STENT PLACEMENT N/A 01/29/2021   Procedure: BILIARY STENT PLACEMENT;  Surgeon: Lemar Lofty., MD;  Location: [facility] ENDOSCOPY;  Service: Gastroenterology;  Laterality: N/A;   BILIARY STENT PLACEMENT  06/26/2021   Procedure: BILIARY STENT PLACEMENT;  Surgeon: Meridee Score Netty Starring., MD;  Location: [facility] ENDOSCOPY;  Service: Gastroenterology;;   BILIARY STENT PLACEMENT  07/24/2021   Procedure: BILIARY STENT PLACEMENT;  Surgeon: Lemar Lofty., MD;  Location: [facility] ENDOSCOPY;  Service: Gastroenterology;;   BIOPSY  11/07/2020   Procedure: BIOPSY;  Surgeon: Lemar Lofty., MD;  Location: [facility] ENDOSCOPY;  Service: Gastroenterology;;   BIOPSY  12/20/2020   Procedure: BIOPSY;  Surgeon: Lemar Lofty., MD;  Location: [facility] ENDOSCOPY;  Service: Gastroenterology;;   BIOPSY  06/26/2021   Procedure: BIOPSY;  Surgeon: Lemar Lofty., MD;  Location: [facility] ENDOSCOPY;  Service: Gastroenterology;;   CARPAL TUNNEL RELEASE Left 02/21/2016   Procedure: CARPAL TUNNEL RELEASE;  Surgeon: Vickki Hearing, MD;  Location: [facility];  Service: Orthopedics;  Laterality: Left;   CHOLECYSTECTOMY N/A 10/09/2020   Procedure: LAPAROSCOPIC CHOLECYSTECTOMY;  Surgeon: Lucretia Roers, MD;  Location: [facility];  Service: General;  Laterality: N/A;   CYST ENTEROSTOMY N/A 11/07/2020   Procedure: CYST ENTEROSTOMY;  Surgeon: Lemar Lofty., MD;  Location: [facility] ENDOSCOPY;  Service: Gastroenterology;  Laterality: N/A;   CYST GASTROSTOMY  11/11/2020   Procedure: CYST NECROSECTOMY;  Surgeon: Meridee Score Netty Starring., MD;  Location: [facility] ENDOSCOPY;  Service: Gastroenterology;;   CYST GASTROSTOMY  11/14/2020   Procedure: CYST NECROSECTOMY;  Surgeon:  Lemar Lofty., MD;  Location: [facility] ENDOSCOPY;  Service: Gastroenterology;;   CYST GASTROSTOMY  06/16/2022   Procedure: CYST GASTROSTOMY;  Surgeon: Lemar Lofty., MD;  Location: [facility] ENDOSCOPY;  Service: Gastroenterology;;   CYST REMOVAL HAND     CYSTOSCOPY  06/26/2021   Procedure: CYSTOGASTROSTOMY;  Surgeon: Mansouraty, Netty Starring., MD;  Location: [facility] ENDOSCOPY;  Service: Gastroenterology;;   ENDOSCOPIC RETROGRADE CHOLANGIOPANCREATOGRAPHY (ERCP) WITH PROPOFOL N/A 11/09/2020   Procedure: ENDOSCOPIC RETROGRADE CHOLANGIOPANCREATOGRAPHY (ERCP) WITH PROPOFOL;  Surgeon: Lemar Lofty., MD;  Location: [facility] ENDOSCOPY;  Service: Gastroenterology;  Laterality: N/A;   ENDOSCOPIC RETROGRADE CHOLANGIOPANCREATOGRAPHY (ERCP) WITH PROPOFOL N/A 12/20/2020   Procedure: ENDOSCOPIC RETROGRADE CHOLANGIOPANCREATOGRAPHY (ERCP) WITH PROPOFOL;  Surgeon: Meridee Score Netty Starring., MD;  Location: [facility] ENDOSCOPY;  Service: Gastroenterology;  Laterality: N/A;   ENDOSCOPIC RETROGRADE CHOLANGIOPANCREATOGRAPHY (ERCP) WITH PROPOFOL N/A 01/29/2021   Procedure: ENDOSCOPIC RETROGRADE CHOLANGIOPANCREATOGRAPHY (ERCP) WITH PROPOFOL;  Surgeon: Meridee Score Netty Starring., MD;  Location: [facility] ENDOSCOPY;  Service: Gastroenterology;  Laterality: N/A;   ENDOSCOPIC RETROGRADE CHOLANGIOPANCREATOGRAPHY (ERCP) WITH PROPOFOL N/A 07/24/2021   Procedure: ENDOSCOPIC RETROGRADE CHOLANGIOPANCREATOGRAPHY (ERCP) WITH PROPOFOL;  Surgeon: Meridee Score Netty Starring., MD;  Location: [facility] ENDOSCOPY;  Service: Gastroenterology;  Laterality: N/A;   ERCP N/A 10/10/2020   Procedure: ENDOSCOPIC RETROGRADE CHOLANGIOPANCREATOGRAPHY (ERCP);  Surgeon: Malissa Hippo, MD;  Location: [facility];  Service: Endoscopy;  Laterality: N/A;   ERCP     ERCP N/A 05/22/2022   Procedure: ENDOSCOPIC RETROGRADE CHOLANGIOPANCREATOGRAPHY (ERCP);  Surgeon: Meridee Score Netty Starring., MD;  Location: [facility] ENDOSCOPY;  Service: Gastroenterology;  Laterality: N/A;    ESOPHAGOGASTRODUODENOSCOPY N/A 11/11/2020   Procedure: ESOPHAGOGASTRODUODENOSCOPY (EGD);  Surgeon: Lemar Lofty., MD;  Location: [facility] ENDOSCOPY;  Service: Gastroenterology;  Laterality: N/A;   ESOPHAGOGASTRODUODENOSCOPY N/A 11/14/2020   Procedure: ESOPHAGOGASTRODUODENOSCOPY (EGD);  Surgeon: Lemar Lofty., MD;  Location: [facility] ENDOSCOPY;  Service: Gastroenterology;  Laterality: N/A;   ESOPHAGOGASTRODUODENOSCOPY (EGD) WITH PROPOFOL N/A 11/09/2020   Procedure: ESOPHAGOGASTRODUODENOSCOPY (EGD) WITH PROPOFOL;  Surgeon: Meridee Score Netty Starring., MD;  Location: [facility] ENDOSCOPY;  Service: Gastroenterology;  Laterality: N/A;   ESOPHAGOGASTRODUODENOSCOPY (EGD) WITH PROPOFOL N/A 11/07/2020   Procedure: ESOPHAGOGASTRODUODENOSCOPY (EGD) WITH PROPOFOL;  Surgeon: Meridee Score Netty Starring., MD;  Location: [facility] ENDOSCOPY;  Service: Gastroenterology;  Laterality: N/A;   ESOPHAGOGASTRODUODENOSCOPY (EGD) WITH PROPOFOL N/A 12/20/2020   Procedure: ESOPHAGOGASTRODUODENOSCOPY (EGD) WITH PROPOFOL;  Surgeon: Meridee Score Netty Starring., MD;  Location: [facility] ENDOSCOPY;  Service: Gastroenterology;  Laterality: N/A;   ESOPHAGOGASTRODUODENOSCOPY (EGD) WITH PROPOFOL N/A 06/26/2021   Procedure: ESOPHAGOGASTRODUODENOSCOPY (EGD) WITH PROPOFOL;  Surgeon: Meridee Score Netty Starring., MD;  Location: [facility] ENDOSCOPY;  Service: Gastroenterology;  Laterality: N/A;   ESOPHAGOGASTRODUODENOSCOPY (EGD) WITH PROPOFOL N/A 07/24/2021   Procedure: ESOPHAGOGASTRODUODENOSCOPY (EGD) WITH PROPOFOL;  Surgeon: Meridee Score Netty Starring., MD;  Location: [facility] ENDOSCOPY;  Service: Gastroenterology;  Laterality: N/A;  AXIOS STENT   ESOPHAGOGASTRODUODENOSCOPY (EGD) WITH PROPOFOL N/A 05/22/2022   Procedure: ESOPHAGOGASTRODUODENOSCOPY (EGD) WITH PROPOFOL;  Surgeon: Meridee Score Netty Starring., MD;  Location: [facility] ENDOSCOPY;  Service: Gastroenterology;  Laterality: N/A;   ESOPHAGOGASTRODUODENOSCOPY (EGD) WITH PROPOFOL N/A 06/16/2022   Procedure: ESOPHAGOGASTRODUODENOSCOPY (EGD)  WITH PROPOFOL;  Surgeon: Meridee Score Netty Starring., MD;  Location: [facility] ENDOSCOPY;  Service: Gastroenterology;  Laterality: N/A;   ESOPHAGOGASTRODUODENOSCOPY (EGD) WITH PROPOFOL N/A 07/30/2022   Procedure: ESOPHAGOGASTRODUODENOSCOPY (EGD) WITH PROPOFOL;  Surgeon: Meridee Score Netty Starring., MD;  Location: [facility] ENDOSCOPY;  Service: Gastroenterology;  Laterality: N/A;   ESOPHAGOGASTRODUODENOSCOPY (EGD) WITH PROPOFOL N/A 03/30/2023   Procedure: ESOPHAGOGASTRODUODENOSCOPY (EGD) WITH PROPOFOL;  Surgeon: Iva Boop, MD;  Location: [facility] ENDOSCOPY;  Service: Gastroenterology;  Laterality: N/A;   EUS  11/14/2020   Procedure: UPPER ENDOSCOPIC ULTRASOUND (EUS) LINEAR;  Surgeon: Lemar Lofty., MD;  Location: [facility] ENDOSCOPY;  Service: Gastroenterology;;   EUS N/A 06/26/2021   Procedure: UPPER ENDOSCOPIC ULTRASOUND (EUS) RADIAL;  Surgeon: Lemar Lofty., MD;  Location: [facility] ENDOSCOPY;  Service: Gastroenterology;  Laterality: N/A;   EUS N/A 05/22/2022   Procedure: UPPER ENDOSCOPIC ULTRASOUND (EUS) LINEAR;  Surgeon: Lemar Lofty., MD;  Location: [facility] ENDOSCOPY;  Service: Gastroenterology;  Laterality: N/A;   EUS N/A 06/16/2022   Procedure: UPPER ENDOSCOPIC ULTRASOUND (EUS) LINEAR;  Surgeon: Lemar Lofty., MD;  Location: [facility] ENDOSCOPY;  Service: Gastroenterology;  Laterality: N/A;   FINE NEEDLE ASPIRATION  05/22/2022   Procedure: FINE NEEDLE ASPIRATION;  Surgeon: Meridee Score Netty Starring., MD;  Location: [facility] ENDOSCOPY;  Service: Gastroenterology;;   GASTROINTESTINAL STENT REMOVAL  12/20/2020   Procedure: GASTROINTESTINAL STENT REMOVAL;  Surgeon: Lemar Lofty., MD;  Location: [facility] ENDOSCOPY;  Service: Gastroenterology;;  cyst gastrostomy stent and double pig tail stents x2 removed   HERNIA REPAIR Left 2002   groin   INGUINAL HERNIA REPAIR Left 04/05/2017   Procedure: RECURRENT HERNIA REPAIR INGUINAL ADULT WITH MESH;  Surgeon: Franky Macho, MD;  Location: [facility];  Service: General;   Laterality: Left;   MASS EXCISION Right 04/29/2020   Procedure: EXCISION MASS RIGHT WRIST;  Surgeon: Betha Loa, MD;  Location: [facility];  Service: Orthopedics;  Laterality: Right;   MASS EXCISION Right 10/09/2020   Procedure: EXCISION MASS,  ABDOMINAL WALL, 2CM;  Surgeon: Lucretia Roers, MD;  Location: [facility];  Service: General;  Laterality: Right;   PANCREATIC STENT PLACEMENT  06/26/2021   Procedure: AXIOS STENT PLACEMENT;  Surgeon: Lemar Lofty., MD;  Location: [facility] ENDOSCOPY;  Service: Gastroenterology;;   PANCREATIC STENT PLACEMENT  06/16/2022   Procedure: PANCREATIC STENT PLACEMENT;  Surgeon: Lemar Lofty., MD;  Location: [facility] ENDOSCOPY;  Service: Gastroenterology;;   REMOVAL OF STONES  12/20/2020   Procedure: REMOVAL OF STONES;  Surgeon: Lemar Lofty., MD;  Location: [facility] ENDOSCOPY;  Service: Gastroenterology;;   REMOVAL OF STONES  07/24/2021   Procedure: REMOVAL OF STONES;  Surgeon: Lemar Lofty., MD;  Location: [facility] ENDOSCOPY;  Service: Gastroenterology;;   REMOVAL OF STONES  05/22/2022   Procedure: REMOVAL OF STONES;  Surgeon: Lemar Lofty., MD;  Location: [facility] ENDOSCOPY;  Service: Gastroenterology;;   Dennison Mascot N/A 10/10/2020   Procedure: Dennison Mascot;  Surgeon: Malissa Hippo, MD;  Location: [facility];  Service: Endoscopy;  Laterality: N/A;   SPHINCTEROTOMY  07/24/2021   Procedure: SPHINCTEROTOMY;  Surgeon: Mansouraty, Netty Starring., MD;  Location: [facility] ENDOSCOPY;  Service: Gastroenterology;;   SPLENECTOMY, TOTAL N/A 11/17/2022   Procedure: SPLENECTOMY;  Surgeon: Fritzi Mandes, MD;  Location: [facility] OR;  Service: General;  Laterality: N/A;   SPYGLASS CHOLANGIOSCOPY N/A 12/20/2020   Procedure: ZOXWRUEA CHOLANGIOSCOPY;  Surgeon: Lemar Lofty., MD;  Location: [facility] ENDOSCOPY;  Service: Gastroenterology;  Laterality: N/A;   SPYGLASS CHOLANGIOSCOPY N/A 07/24/2021   Procedure: SPYGLASS CHOLANGIOSCOPY;  Surgeon: Lemar Lofty., MD;  Location: [facility] ENDOSCOPY;  Service: Gastroenterology;  Laterality: N/A;   STENT REMOVAL  11/09/2020   Procedure: STENT REMOVAL;  Surgeon: Lemar Lofty., MD;  Location: [facility] ENDOSCOPY;  Service: Gastroenterology;;   Francine Graven REMOVAL  11/11/2020   Procedure: STENT REMOVAL;  Surgeon: Lemar Lofty., MD;  Location: [facility] ENDOSCOPY;  Service: Gastroenterology;;   Francine Graven REMOVAL  11/14/2020   Procedure: STENT REMOVAL;  Surgeon: Lemar Lofty., MD;  Location: [facility] ENDOSCOPY;  Service: Gastroenterology;;   Francine Graven REMOVAL  12/20/2020   Procedure: STENT REMOVAL;  Surgeon: Lemar Lofty., MD;  Location: [facility] ENDOSCOPY;  Service: Gastroenterology;;  biliary x2   STENT REMOVAL  07/24/2021   Procedure: AXIOS STENT REMOVAL;  Surgeon: Lemar Lofty., MD;  Location: [facility] ENDOSCOPY;  Service: Gastroenterology;;   Francine Graven REMOVAL  07/30/2022   Procedure: STENT REMOVAL;  Surgeon: Lemar Lofty., MD;  Location: [facility] ENDOSCOPY;  Service: Gastroenterology;;   UPPER ESOPHAGEAL ENDOSCOPIC ULTRASOUND (EUS) N/A 11/07/2020   Procedure: UPPER ESOPHAGEAL ENDOSCOPIC ULTRASOUND (EUS);  Surgeon: Lemar Lofty., MD;  Location: [facility] ENDOSCOPY;  Service: Gastroenterology;  Laterality: N/A;   UPPER GASTROINTESTINAL ENDOSCOPY     WOUND DEBRIDEMENT  11/09/2020   Procedure: CYST NECROSECTOMY;  Surgeon: Mansouraty, Netty Starring., MD;  Location: [facility] ENDOSCOPY;  Service: Gastroenterology;;    Social History   Socioeconomic History   Marital status: Single    Spouse name: Not on file   Number of children: 0   Years of education: Not on file   Highest education level: GED or equivalent  Occupational History   Not on file  Tobacco Use   Smoking status: Every Day    Current packs/day: 0.25    Average packs/day: 0.3 packs/day for 26.0 years (6.5 ttl pk-yrs)    Types: Cigarettes   Smokeless tobacco: Never   Tobacco comments:    4-5 cig daily as of 10/07/2020.  Vaping Use    Vaping status:  Never Used  Substance and Sexual Activity   Alcohol use: No   Drug use: No   Sexual activity: Never    Birth control/protection: None  Other Topics Concern   Not on file  Social History Narrative   Not on file   Social Drivers of Health   Financial Resource Strain: Low Risk  (07/27/2023)   Overall Financial Resource Strain (CARDIA)    Difficulty of Paying Living Expenses: Not very hard  Food Insecurity: No Food Insecurity (08/16/2023)   Hunger Vital Sign    Worried About Running Out of Food in the Last Year: Never true    Ran Out of Food in the Last Year: Never true  Recent Concern: Food Insecurity - Food Insecurity Present (07/27/2023)   Hunger Vital Sign    Worried About Running Out of Food in the Last Year: Never true    Ran Out of Food in the Last Year: Sometimes true  Transportation Needs: No Transportation Needs (08/16/2023)   PRAPARE - Administrator, Civil Service (Medical): No    Lack of Transportation (Non-Medical): No  Physical Activity: Insufficiently Active (07/27/2023)   Exercise Vital Sign    Days of Exercise per Week: 2 days    Minutes of Exercise per Session: 10 min  Stress: No Stress Concern Present (07/27/2023)   [employer] of Occupational Health - Occupational Stress Questionnaire    Feeling of Stress : Only a little  Social Connections: Unknown (07/27/2023)   Social Connection and Isolation Panel [NHANES]    Frequency of Communication with Friends and Family: Three times a week    Frequency of Social Gatherings with Friends and Family: Once a week    Attends Religious Services: Patient declined    Database administrator or Organizations: No    Attends Engineer, structural: Not on file    Marital Status: Divorced  Intimate Partner Violence: Not At Risk (08/16/2023)   Humiliation, Afraid, Rape, and Kick questionnaire    Fear of Current or Ex-Partner: No    Emotionally Abused: No    Physically Abused: No    Sexually  Abused: No    Outpatient Encounter Medications as of 08/18/2023  Medication Sig   acetaminophen (TYLENOL) 500 MG tablet Take 2 tablets (1,000 mg total) by mouth every 8 (eight) hours as needed for mild pain.   albuterol (VENTOLIN HFA) 108 (90 Base) MCG/ACT inhaler Inhale 2 puffs into the lungs every 6 (six) hours as needed for wheezing or shortness of breath.   aspirin EC 81 MG tablet Take 81-162 mg by mouth daily as needed (for pain). Swallow whole.   Continuous Blood Gluc Receiver (FREESTYLE LIBRE READER) DEVI 1 Units by Does not apply route daily. UAD to test BGs daily. Dx E11.9   Continuous Glucose Sensor (FREESTYLE LIBRE 3 PLUS SENSOR) MISC Check BGs continuously. E11.9 Change sensor every 15 days.   esomeprazole (NEXIUM) 20 MG capsule Take 1 capsule (20 mg total) by mouth daily.   Insulin Pen Needle (BD PEN NEEDLE NANO U/F) 32G X 4 MM MISC UAD to administer insulin   lipase/protease/amylase (CREON) 36000 UNITS CPEP capsule Take 3 capsules with meals and 1-2  capsule with snacks ( up too 2 snacks ) (Patient taking differently: Take 36,000-108,000 Units by mouth See admin instructions. Take 108,000-144,000 units by mouth with each meal eaten and 72,000 units with each snack)   naloxone (NARCAN) nasal spray 4 mg/0.1 mL Place 1 spray into the nose as directed.  NOVOLOG RELION 100 UNIT/ML injection Inject 5-15 Units into the skin See admin instructions. Inject 15 units into the skin three times a day with meals and 5-10 units as needed with snacks, depending on BGL reading   ondansetron (ZOFRAN) 4 MG tablet Take 4 mg by mouth every 6 (six) hours as needed for nausea or vomiting.   Oxycodone HCl 10 MG TABS Take 1 tablet (10 mg total) by mouth every 6 (six) hours as needed. (Patient taking differently: Take 10 mg by mouth every 6 (six) hours as needed (for pain).)   rosuvastatin (CRESTOR) 10 MG tablet Take 1 tablet (10 mg total) by mouth at bedtime. If you experience muscle aches take every other day    SEMGLEE, YFGN, 100 UNIT/ML Pen Inject 40 Units into the skin 2 (two) times daily.   No facility-administered encounter medications on file as of 08/18/2023.    Allergies  Allergen Reactions   Desipramine Nausea Only and Rash    Review of Systems As per HPI  Objective:  BP 111/70   Pulse 69   Temp (!) 96.8 F (36 C)   Ht 5\' 9"  (1.753 m)   Wt 166 lb 12.8 oz (75.7 kg)   SpO2 97%   BMI 24.63 kg/m    Wt Readings from Last 3 Encounters:  08/18/23 166 lb 12.8 oz (75.7 kg)  08/12/23 147 lb 14.9 oz (67.1 kg)  07/28/23 164 lb (74.4 kg)   Physical Exam Constitutional:      General: He is awake. He is not in acute distress.    Appearance: Normal appearance. He is well-developed and well-groomed. He is ill-appearing. He is not toxic-appearing or diaphoretic.     Comments: Chronically ill appearing   Cardiovascular:     Rate and Rhythm: Normal rate and regular rhythm.     Pulses: Normal pulses.          Radial pulses are 2+ on the right side and 2+ on the left side.       Posterior tibial pulses are 2+ on the right side and 2+ on the left side.     Heart sounds: Normal heart sounds. No murmur heard.    No gallop.  Pulmonary:     Effort: Pulmonary effort is normal. No respiratory distress.     Breath sounds: Normal breath sounds. No stridor. No wheezing, rhonchi or rales.  Musculoskeletal:     Cervical back: Full passive range of motion without pain and neck supple.     Right lower leg: No edema.     Left lower leg: No edema.  Skin:    General: Skin is warm.     Capillary Refill: Capillary refill takes less than 2 seconds.  Neurological:     General: No focal deficit present.     Mental Status: He is alert, oriented to person, place, and time and easily aroused. Mental status is at baseline.     GCS: GCS eye subscore is 4. GCS verbal subscore is 5. GCS motor subscore is 6.     Motor: No weakness.  Psychiatric:        Attention and Perception: Attention and perception  normal.        Mood and Affect: Mood normal. Affect is flat.        Speech: Speech normal.        Behavior: Behavior normal. Behavior is cooperative.        Thought Content: Thought content normal. Thought content does not include homicidal  or suicidal ideation. Thought content does not include homicidal or suicidal plan.        Cognition and Memory: Cognition and memory normal.        Judgment: Judgment normal.    Results for orders placed or performed during the hospital encounter of 08/11/23  Urinalysis, Routine w reflex microscopic -Urine, Clean Catch   Collection Time: 08/11/23  5:25 PM  Result Value Ref Range   Color, Urine COLORLESS (A) YELLOW   APPearance CLEAR CLEAR   Specific Gravity, Urine 1.026 1.005 - 1.030   pH 5.0 5.0 - 8.0   Glucose, UA >=500 (A) NEGATIVE mg/dL   Hgb urine dipstick NEGATIVE NEGATIVE   Bilirubin Urine NEGATIVE NEGATIVE   Ketones, ur 20 (A) NEGATIVE mg/dL   Protein, ur NEGATIVE NEGATIVE mg/dL   Nitrite NEGATIVE NEGATIVE   Leukocytes,Ua NEGATIVE NEGATIVE   RBC / HPF 0-5 0 - 5 RBC/hpf   WBC, UA 0-5 0 - 5 WBC/hpf   Bacteria, UA NONE SEEN NONE SEEN   Squamous Epithelial / HPF 0-5 0 - 5 /HPF   Mucus PRESENT   Lipase, blood   Collection Time: 08/11/23  5:35 PM  Result Value Ref Range   Lipase 79 (H) 11 - 51 U/L  Comprehensive metabolic panel   Collection Time: 08/11/23  5:35 PM  Result Value Ref Range   Sodium 122 (L) 135 - 145 mmol/L   Potassium 6.5 (HH) 3.5 - 5.1 mmol/L   Chloride 84 (L) 98 - 111 mmol/L   CO2 15 (L) 22 - 32 mmol/L   Glucose, Bld 975 (HH) 70 - 99 mg/dL   BUN 33 (H) 6 - 20 mg/dL   Creatinine, Ser 4.09 (H) 0.61 - 1.24 mg/dL   Calcium 9.8 8.9 - 81.1 mg/dL   Total Protein 8.2 (H) 6.5 - 8.1 g/dL   Albumin 4.1 3.5 - 5.0 g/dL   AST 19 15 - 41 U/L   ALT 135 (H) 0 - 44 U/L   Alkaline Phosphatase 1,076 (H) 38 - 126 U/L   Total Bilirubin 1.5 (H) 0.0 - 1.2 mg/dL   GFR, Estimated >91 >47 mL/min   Anion gap 23 (H) 5 - 15  CBC    Collection Time: 08/11/23  5:35 PM  Result Value Ref Range   WBC 12.6 (H) 4.0 - 10.5 K/uL   RBC 5.88 (H) 4.22 - 5.81 MIL/uL   Hemoglobin 17.9 (H) 13.0 - 17.0 g/dL   HCT 82.9 (H) 56.2 - 13.0 %   MCV 92.5 80.0 - 100.0 fL   MCH 30.4 26.0 - 34.0 pg   MCHC 32.9 30.0 - 36.0 g/dL   RDW 86.5 78.4 - 69.6 %   Platelets 510 (H) 150 - 400 K/uL   nRBC 0.0 0.0 - 0.2 %  CBG monitoring, ED   Collection Time: 08/11/23 11:16 PM  Result Value Ref Range   Glucose-Capillary >600 (HH) 70 - 99 mg/dL   Comment 1 Notify RN    Comment 2 Document in Chart   Osmolality   Collection Time: 08/11/23 11:49 PM  Result Value Ref Range   Osmolality 329 (HH) 275 - 295 mOsm/kg  Blood gas, venous   Collection Time: 08/12/23 12:06 AM  Result Value Ref Range   pH, Ven 7.24 (L) 7.25 - 7.43   pCO2, Ven 52 44 - 60 mmHg   pO2, Ven 34 32 - 45 mmHg   Bicarbonate 22.3 20.0 - 28.0 mmol/L   Acid-base deficit 5.5 (H) 0.0 - 2.0  mmol/L   O2 Saturation 58.9 %   Patient temperature 37.0   Glucose, capillary   Collection Time: 08/12/23 12:37 AM  Result Value Ref Range   Glucose-Capillary 525 (HH) 70 - 99 mg/dL  Glucose, capillary   Collection Time: 08/12/23  1:34 AM  Result Value Ref Range   Glucose-Capillary 417 (H) 70 - 99 mg/dL  Glucose, capillary   Collection Time: 08/12/23  2:03 AM  Result Value Ref Range   Glucose-Capillary 349 (H) 70 - 99 mg/dL  Basic metabolic panel   Collection Time: 08/12/23  2:57 AM  Result Value Ref Range   Sodium 135 135 - 145 mmol/L   Potassium 3.7 3.5 - 5.1 mmol/L   Chloride 100 98 - 111 mmol/L   CO2 22 22 - 32 mmol/L   Glucose, Bld 311 (H) 70 - 99 mg/dL   BUN 29 (H) 6 - 20 mg/dL   Creatinine, Ser 1.61 0.61 - 1.24 mg/dL   Calcium 9.3 8.9 - 09.6 mg/dL   GFR, Estimated >04 >54 mL/min   Anion gap 13 5 - 15  CBC   Collection Time: 08/12/23  2:57 AM  Result Value Ref Range   WBC 13.6 (H) 4.0 - 10.5 K/uL   RBC 4.92 4.22 - 5.81 MIL/uL   Hemoglobin 15.6 13.0 - 17.0 g/dL   HCT 09.8  11.9 - 14.7 %   MCV 90.4 80.0 - 100.0 fL   MCH 31.7 26.0 - 34.0 pg   MCHC 35.1 30.0 - 36.0 g/dL   RDW 82.9 56.2 - 13.0 %   Platelets 464 (H) 150 - 400 K/uL   nRBC 0.0 0.0 - 0.2 %  Glucose, capillary   Collection Time: 08/12/23  3:09 AM  Result Value Ref Range   Glucose-Capillary 292 (H) 70 - 99 mg/dL  Beta-hydroxybutyric acid   Collection Time: 08/12/23  4:06 AM  Result Value Ref Range   Beta-Hydroxybutyric Acid 1.79 (H) 0.05 - 0.27 mmol/L  Glucose, capillary   Collection Time: 08/12/23  4:12 AM  Result Value Ref Range   Glucose-Capillary 241 (H) 70 - 99 mg/dL  Glucose, capillary   Collection Time: 08/12/23  5:13 AM  Result Value Ref Range   Glucose-Capillary 174 (H) 70 - 99 mg/dL  Glucose, capillary   Collection Time: 08/12/23  6:16 AM  Result Value Ref Range   Glucose-Capillary 209 (H) 70 - 99 mg/dL  Glucose, capillary   Collection Time: 08/12/23  7:18 AM  Result Value Ref Range   Glucose-Capillary 196 (H) 70 - 99 mg/dL  Glucose, capillary   Collection Time: 08/12/23  8:43 AM  Result Value Ref Range   Glucose-Capillary 176 (H) 70 - 99 mg/dL  Basic metabolic panel   Collection Time: 08/12/23  8:46 AM  Result Value Ref Range   Sodium 137 135 - 145 mmol/L   Potassium 3.8 3.5 - 5.1 mmol/L   Chloride 104 98 - 111 mmol/L   CO2 25 22 - 32 mmol/L   Glucose, Bld 171 (H) 70 - 99 mg/dL   BUN 26 (H) 6 - 20 mg/dL   Creatinine, Ser 8.65 0.61 - 1.24 mg/dL   Calcium 9.0 8.9 - 78.4 mg/dL   GFR, Estimated >69 >62 mL/min   Anion gap 8 5 - 15  Glucose, capillary   Collection Time: 08/12/23  9:27 AM  Result Value Ref Range   Glucose-Capillary 172 (H) 70 - 99 mg/dL  Glucose, capillary   Collection Time: 08/12/23 10:28 AM  Result Value Ref  Range   Glucose-Capillary 168 (H) 70 - 99 mg/dL  Glucose, capillary   Collection Time: 08/12/23 11:50 AM  Result Value Ref Range   Glucose-Capillary 179 (H) 70 - 99 mg/dL  Glucose, capillary   Collection Time: 08/12/23  4:24 PM  Result  Value Ref Range   Glucose-Capillary 274 (H) 70 - 99 mg/dL  Glucose, capillary   Collection Time: 08/12/23 10:42 PM  Result Value Ref Range   Glucose-Capillary 358 (H) 70 - 99 mg/dL  Basic metabolic panel   Collection Time: 08/13/23  4:50 AM  Result Value Ref Range   Sodium 132 (L) 135 - 145 mmol/L   Potassium 3.5 3.5 - 5.1 mmol/L   Chloride 98 98 - 111 mmol/L   CO2 26 22 - 32 mmol/L   Glucose, Bld 306 (H) 70 - 99 mg/dL   BUN 18 6 - 20 mg/dL   Creatinine, Ser 4.74 (L) 0.61 - 1.24 mg/dL   Calcium 8.4 (L) 8.9 - 10.3 mg/dL   GFR, Estimated >25 >95 mL/min   Anion gap 8 5 - 15  Glucose, capillary   Collection Time: 08/13/23  7:21 AM  Result Value Ref Range   Glucose-Capillary 208 (H) 70 - 99 mg/dL       6/38/7564    3:32 AM 04/16/2023    8:09 AM 12/28/2022    1:08 PM 09/25/2022    8:06 AM 06/26/2022    2:57 PM  Depression screen PHQ 2/9  Decreased Interest 0 0 0 0 1  Down, Depressed, Hopeless 0 0 0 0 0  PHQ - 2 Score 0 0 0 0 1  Altered sleeping 0 1 2 0 1  Tired, decreased energy 0 1 2 0 1  Change in appetite 0 1 2 0 1  Feeling bad or failure about yourself  0 0 0 0 0  Trouble concentrating 0 0 0 0 0  Moving slowly or fidgety/restless 0 0 0 0 1  Suicidal thoughts 0 0 0 0 0  PHQ-9 Score 0 3 6 0 5  Difficult doing work/chores  Not difficult at all Not difficult at all Not difficult at all Somewhat difficult       07/28/2023    9:42 AM 04/16/2023    8:09 AM 12/28/2022    1:08 PM 09/25/2022    8:05 AM  GAD 7 : Generalized Anxiety Score  Nervous, Anxious, on Edge 0 0 0 0  Control/stop worrying 0 0 0 0  Worry too much - different things 0 0 0 0  Trouble relaxing 0 0 1 0  Restless 0 0 1 0  Easily annoyed or irritable 0 0 1 0  Afraid - awful might happen 0 0 0 0  Total GAD 7 Score 0 0 3 0  Anxiety Difficulty Not difficult at all Not difficult at all Somewhat difficult Not difficult at all   Pertinent labs & imaging results that were available during my care of the patient  were reviewed by me and considered in my medical decision making.  Assessment & Plan:  Jaxsen was seen today for hospitalization follow-up.  Diagnoses and all orders for this visit:  Diabetic ketoacidosis without coma associated with diabetes mellitus due to underlying condition [facility]) Reviewed notes from admission 08/11/2023, Mikeal Hawthorne, MD  Reviewed notes from discharge 08/13/2023, Maryfrances Bunnell, MD  Reviewed notes from PCP, Nadine Counts, DO on 07/28/23 Patient to pick up libre today and monitor BG at home. Labs as below. Will communicate results to patient once available.  Will await results to determine next steps.  Will have patient with close follow up with PCP.  -     CBC with Differential/Platelet -     CMP14+EGFR/ -     Magnesium  Diabetes mellitus treated with insulin (HCC) As above.  -     CBC with Differential/Platelet -     CMP14+EGFR -     Magnesium  Electrolyte abnormality As above.  -     CMP14+EGFR -     Magnesium  Pancreatic pseudocyst Reviewed notes from Mansouraty, MD 06/08/2023. Patient instructed to follow up in 4 weeks. Encouraged patient to follow up with specialty for further management.    Continue all other maintenance medications.  Follow up plan: Return in about 2 weeks (around 09/01/2023) for w/ PCP.   Continue healthy lifestyle choices, including diet (rich in fruits, vegetables, and lean proteins, and low in salt and simple carbohydrates) and exercise (at least 30 minutes of moderate physical activity daily).  Written and verbal instructions provided   The above assessment and management plan was discussed with the patient. The patient verbalized understanding of and has agreed to the management plan. Patient is aware to call the clinic if they develop any new symptoms or if symptoms persist or worsen. Patient is aware when to return to the clinic for a follow-up visit. Patient educated on when it is appropriate to go to the emergency department.   Neale Burly, DNP-FNP [facility] [address] [place], [place] [address] [phone]

## 2023-08-19 ENCOUNTER — Encounter: Payer: Self-pay | Admitting: Family Medicine

## 2023-08-19 LAB — CMP14+EGFR
ALT: 76 IU/L — ABNORMAL HIGH (ref 0–44)
AST: 39 IU/L (ref 0–40)
Albumin: 3.7 g/dL — ABNORMAL LOW (ref 3.8–4.9)
Alkaline Phosphatase: 647 IU/L — ABNORMAL HIGH (ref 44–121)
BUN/Creatinine Ratio: 20 (ref 9–20)
BUN: 15 mg/dL (ref 6–24)
CO2: 26 mmol/L (ref 20–29)
Calcium: 9.4 mg/dL (ref 8.7–10.2)
Chloride: 95 mmol/L — ABNORMAL LOW (ref 96–106)
Creatinine, Ser: 0.75 mg/dL — ABNORMAL LOW (ref 0.76–1.27)
Globulin, Total: 2 g/dL (ref 1.5–4.5)
Glucose: 209 mg/dL — ABNORMAL HIGH (ref 70–99)
Potassium: 4.7 mmol/L (ref 3.5–5.2)
Sodium: 135 mmol/L (ref 134–144)
Total Protein: 5.7 g/dL — ABNORMAL LOW (ref 6.0–8.5)
eGFR: 109 mL/min/{1.73_m2} (ref >59)

## 2023-08-19 LAB — CBC WITH DIFFERENTIAL/PLATELET
Basophils Absolute: 0.1 10*3/uL (ref 0.0–0.2)
Basos: 1 %
EOS (ABSOLUTE): 0.1 10*3/uL (ref 0.0–0.4)
Eos: 1 %
Hematocrit: 41 % (ref 37.5–51.0)
Hemoglobin: 13.8 g/dL (ref 13.0–17.7)
Immature Grans (Abs): 0.2 10*3/uL — ABNORMAL HIGH (ref 0.0–0.1)
Immature Granulocytes: 1 %
Lymphocytes Absolute: 3.7 10*3/uL — ABNORMAL HIGH (ref 0.7–3.1)
Lymphs: 26 %
MCH: 31.7 pg (ref 26.6–33.0)
MCHC: 33.7 g/dL (ref 31.5–35.7)
MCV: 94 fL (ref 79–97)
Monocytes Absolute: 0.7 10*3/uL (ref 0.1–0.9)
Monocytes: 5 %
Neutrophils Absolute: 9.3 10*3/uL — ABNORMAL HIGH (ref 1.4–7.0)
Neutrophils: 66 %
Platelets: 484 10*3/uL — ABNORMAL HIGH (ref 150–450)
RBC: 4.36 x10E6/uL (ref 4.14–5.80)
RDW: 12.4 % (ref 11.6–15.4)
WBC: 14.2 10*3/uL — ABNORMAL HIGH (ref 3.4–10.8)

## 2023-08-19 LAB — MAGNESIUM: Magnesium: 2.1 mg/dL (ref 1.6–2.3)

## 2023-08-19 NOTE — Progress Notes (Signed)
CBC with stable elevations. BG, potassium, and liver enzymes improved. Decreased protein and albumin, recommend patient increase protein in diet. Creatinine and chloride with mild variations that we will continue to monitor. Please follow up with PCP in one week.

## 2023-08-23 ENCOUNTER — Other Ambulatory Visit: Payer: Self-pay | Admitting: *Deleted

## 2023-08-23 ENCOUNTER — Encounter: Payer: Self-pay | Admitting: *Deleted

## 2023-08-23 NOTE — Patient Outreach (Signed)
Care Management  Transitions of Care Program Transitions of Care Post-discharge week 2   08/23/2023 Name: Steve Romero MRN: [id number] DOB: 01/30/1971  Subjective: Steve Romero is a 53 y.o. year old male who is a primary care patient of Raliegh Ip, DO. The Care Management team Engaged with patient Engaged with patient by telephone to assess and address transitions of care needs.   Consent to Services:  Patient was given information about care management services, agreed to services, and gave verbal consent to participate.   Assessment: Patient reports he is "feeling much better"  CBG under better control, patient's care transferred to [facility] tailored plan care management.         SDOH Interventions    Flowsheet Row Telephone from 08/16/2023 in [facility] Telephone from 04/02/2023 in [facility] Coordination Office Visit from 06/11/2022 in [facility] Office Visit from 01/20/2022 in [facility]  SDOH Interventions      Food Insecurity Interventions Intervention Not Indicated Intervention Not Indicated -- --  Housing Interventions Intervention Not Indicated Intervention Not Indicated -- --  Transportation Interventions Intervention Not Indicated Intervention Not Indicated -- --  Utilities Interventions Intervention Not Indicated -- -- --  Depression Interventions/Treatment  -- -- Counseling PHQ2-9 Score <4 Follow-up Not Indicated        Goals Addressed             This Visit's Progress    COMPLETED: Transition of Care/ pt will have no readmissions wtihin 30 days       Current Barriers:  Provider appointments pt does not have post hospital primary care provider appointment, states he will call today and schedule appointment, he did not want the appointment given by the care guide for 2/12 Knowledge Deficits related to plan of care for  management of DMII  Pt states he has a pancreatic pseudocyst and "part of my pancreas removed and this is what's making blood sugar worse" Patient's CBG monitored by [facility],  reports all readings under 150,  pt is pleased that CBG readings improved, weight 160 pounds, pt does not want to lose anymore weight  RNCM Clinical Goal(s):  Patient will work with the Care Management team over the next 30 days to address Transition of Care Barriers: pt does not have post hospital primary care provider appointment verbalize understanding of plan for management of DMII as evidenced by patient report, review of EMR and  through collaboration with RN Care manager, provider, and care team.   Interventions: Evaluation of current treatment plan related to  self management and patient's adherence to plan as established by provider   Diabetes Interventions:  (Status:  New goal. and Goal on track:  Yes.) Short Term Goal Assessed patient's understanding of A1c goal: <7% Reviewed medications with patient and discussed importance of medication adherence Counseled on importance of regular laboratory monitoring as prescribed Discussed plans with patient for ongoing care management follow up and provided patient with direct contact information for care management team Reviewed scheduled/upcoming provider appointments including: endocrinologist Komal Motwani on 09/17/23 @ 940 am, labs on 09/14/23 @ 945 am Review of patient status, including review of consultants reports, relevant laboratory and other test results, and medications completed Reinforced plate method, importance of being mindful of carbohydrate intake at each meal Reinforced importance of adequate protein at each meal Reviewed plan of care with patient including case closure and will not be  managed by [facility]  Telephone call to [facility], spoke with Romeo Apple, information given for transfer of care to [facility], Romeo Apple did speak with pt to coordinate  care and mail pt a new insurance card,  Reference # [id number] Lab Results  Component Value Date   HGBA1C >14.0 (H) 06/08/2023    Patient Goals/Self-Care Activities: Participate in Transition of Care Program/Attend [facility] scheduled calls Notify RN Care Manager of [facility] call rescheduling needs Take all medications as prescribed Attend all scheduled provider appointments Call pharmacy for medication refills 3-7 days in advance of running out of medications Attend church or other social activities Perform all self care activities independently  Call provider office for new concerns or questions  check blood sugar at prescribed times: Freestyle LIbre check feet daily for cuts, sores or redness take the blood sugar log to all doctor visits trim toenails straight across fill half of plate with vegetables prepare main meal at home 3 to 5 days each week wash and dry feet carefully every day wear comfortable, cotton socks wear comfortable, well-fitting shoes Case closure today- [facility] will provide tailored care management and mail you a new insurance card - [facility]'s number is [phone]  Follow Up Plan:  The patient has been provided with contact information for the care management team and has been advised to call with any health related questions or concerns.          Plan: The patient has been provided with contact information for the care management team and has been advised to call with any health related questions or concerns.   Irving Shows [facility], BSN RN Care Manager/ Transition of Care [place]/ [facility] [phone]

## 2023-08-23 NOTE — Patient Instructions (Signed)
Visit Information  Thank you for taking time to visit with me today. Please don't hesitate to contact me if I can be of assistance to you before our next scheduled telephone appointment.  Following are the goals we discussed today:   Goals Addressed             This Visit's Progress    COMPLETED: Transition of Care/ pt will have no readmissions wtihin 30 days       Current Barriers:  Provider appointments pt does not have post hospital primary care provider appointment, states he will call today and schedule appointment, he did not want the appointment given by the care guide for 2/12 Knowledge Deficits related to plan of care for management of DMII  Pt states he has a pancreatic pseudocyst and "part of my pancreas removed and this is what's making blood sugar worse" Patient's CBG monitored by [facility],  reports all readings under 150,  pt is pleased that CBG readings improved, weight 160 pounds, pt does not want to lose anymore weight  RNCM Clinical Goal(s):  Patient will work with the Care Management team over the next 30 days to address Transition of Care Barriers: pt does not have post hospital primary care provider appointment verbalize understanding of plan for management of DMII as evidenced by patient report, review of EMR and  through collaboration with RN Care manager, provider, and care team.   Interventions: Evaluation of current treatment plan related to  self management and patient's adherence to plan as established by provider   Diabetes Interventions:  (Status:  New goal. and Goal on track:  Yes.) Short Term Goal Assessed patient's understanding of A1c goal: <7% Reviewed medications with patient and discussed importance of medication adherence Counseled on importance of regular laboratory monitoring as prescribed Discussed plans with patient for ongoing care management follow up and provided patient with direct contact information for care management  team Reviewed scheduled/upcoming provider appointments including: endocrinologist Komal Motwani on 09/17/23 @ 940 am, labs on 09/14/23 @ 945 am Review of patient status, including review of consultants reports, relevant laboratory and other test results, and medications completed Reinforced plate method, importance of being mindful of carbohydrate intake at each meal Reinforced importance of adequate protein at each meal Reviewed plan of care with patient including case closure and will not be managed by [facility]  Telephone call to [facility], spoke with Romeo Apple, information given for transfer of care to [facility], Romeo Apple did speak with pt to coordinate care and mail pt a new insurance card,  Reference # [id number] Lab Results  Component Value Date   HGBA1C >14.0 (H) 06/08/2023    Patient Goals/Self-Care Activities: Participate in Transition of Care Program/Attend [facility] scheduled calls Notify RN Care Manager of [facility] call rescheduling needs Take all medications as prescribed Attend all scheduled provider appointments Call pharmacy for medication refills 3-7 days in advance of running out of medications Attend church or other social activities Perform all self care activities independently  Call provider office for new concerns or questions  check blood sugar at prescribed times: Freestyle LIbre check feet daily for cuts, sores or redness take the blood sugar log to all doctor visits trim toenails straight across fill half of plate with vegetables prepare main meal at home 3 to 5 days each week wash and dry feet carefully every day wear comfortable, cotton socks wear comfortable, well-fitting shoes Case closure today- [facility] will provide tailored care management and mail you  a new insurance card - [facility]'s number is [phone]  Follow Up Plan:  The patient has been provided with contact information for the care management team and has been advised to call with any health  related questions or concerns.            Please call the care guide team at [phone] if you need to cancel or reschedule your appointment.   If you are experiencing a Mental Health or Behavioral Health Crisis or need someone to talk to, please call the Suicide and Crisis Lifeline: 988 call the [place] National Suicide Prevention Lifeline: (854)766-6723 or TTY: 408-844-7912 TTY 260 697 8079) to talk to a trained counselor call 1-800-273-TALK (toll free, 24 hour hotline) go to [facility] [address] [place] [phone]) call the [facility] Crisis Line: (613)173-8090 call 911   Patient verbalizes understanding of instructions and care plan provided today and agrees to view in MyChart. Active MyChart status and patient understanding of how to access instructions and care plan via MyChart confirmed with patient.     Irving Shows [facility], BSN RN Care Manager/ Transition of Care [place]/ [facility] [phone]

## 2023-09-06 ENCOUNTER — Other Ambulatory Visit: Payer: Self-pay

## 2023-09-06 DIAGNOSIS — E119 Type 2 diabetes mellitus without complications: Secondary | ICD-10-CM

## 2023-09-14 ENCOUNTER — Other Ambulatory Visit: Payer: MEDICAID

## 2023-09-15 LAB — COMPREHENSIVE METABOLIC PANEL
AG Ratio: 1.6 (calc) (ref 1.0–2.5)
ALT: 50 U/L — ABNORMAL HIGH (ref 9–46)
AST: 23 U/L (ref 10–35)
Albumin: 4.1 g/dL (ref 3.6–5.1)
Alkaline phosphatase (APISO): 343 U/L — ABNORMAL HIGH (ref 35–144)
BUN: 16 mg/dL (ref 7–25)
CO2: 32 mmol/L (ref 20–32)
Calcium: 9.7 mg/dL (ref 8.6–10.3)
Chloride: 98 mmol/L (ref 98–110)
Creat: 0.79 mg/dL (ref 0.70–1.30)
Globulin: 2.6 g/dL (ref 1.9–3.7)
Glucose, Bld: 302 mg/dL — ABNORMAL HIGH (ref 65–99)
Potassium: 4.8 mmol/L (ref 3.5–5.3)
Sodium: 136 mmol/L (ref 135–146)
Total Bilirubin: 0.5 mg/dL (ref 0.2–1.2)
Total Protein: 6.7 g/dL (ref 6.1–8.1)

## 2023-09-15 LAB — LIPID PANEL
Cholesterol: 158 mg/dL (ref <200)
HDL: 44 mg/dL (ref >=40)
LDL Cholesterol (Calc): 98 mg/dL
Non-HDL Cholesterol (Calc): 114 mg/dL (ref <130)
Total CHOL/HDL Ratio: 3.6 (calc) (ref <5.0)
Triglycerides: 72 mg/dL (ref <150)

## 2023-09-15 LAB — HEMOGLOBIN A1C
Hgb A1c MFr Bld: 13.7 %{Hb} — ABNORMAL HIGH (ref <5.7)
Mean Plasma Glucose: 346 mg/dL
eAG (mmol/L): 19.2 mmol/L

## 2023-09-15 LAB — MICROALBUMIN / CREATININE URINE RATIO
Creatinine, Urine: 198 mg/dL (ref 20–320)
Microalb Creat Ratio: 3 mg/g{creat} (ref <30)
Microalb, Ur: 0.5 mg/dL

## 2023-09-15 LAB — C-PEPTIDE: C-Peptide: 1.42 ng/mL (ref 0.80–3.85)

## 2023-09-17 ENCOUNTER — Encounter (HOSPITAL_COMMUNITY): Payer: Self-pay | Admitting: Gastroenterology

## 2023-09-17 ENCOUNTER — Telehealth: Payer: Self-pay

## 2023-09-17 ENCOUNTER — Encounter: Payer: Self-pay | Admitting: "Endocrinology

## 2023-09-17 ENCOUNTER — Ambulatory Visit: Payer: MEDICAID | Admitting: "Endocrinology

## 2023-09-17 VITALS — BP 122/82 | HR 82 | Ht 69.0 in | Wt 165.0 lb

## 2023-09-17 DIAGNOSIS — E1165 Type 2 diabetes mellitus with hyperglycemia: Secondary | ICD-10-CM

## 2023-09-17 DIAGNOSIS — E7800 Pure hypercholesterolemia, unspecified: Secondary | ICD-10-CM | POA: Diagnosis not present

## 2023-09-17 DIAGNOSIS — Z794 Long term (current) use of insulin: Secondary | ICD-10-CM | POA: Diagnosis not present

## 2023-09-17 MED ORDER — BAQSIMI ONE PACK 3 MG/DOSE NA POWD: 1.0000 | NASAL | 3 refills | Status: AC | PRN

## 2023-09-17 NOTE — Patient Instructions (Addendum)
 Will recommend the following: Semglee 50 units bid Novolog 20 units tid 15 min before meals   Correction scale: Use in addition to your meal time/short acting insulin based on blood sugars as follows:  151 - 180: 1 unit 181 - 210: 2 units 211 - 240: 3 units 241 - 270: 4 units 271 - 300: 5 units 301 - 330: 6 units 331 - 360: 7 units 361 - 390: 8 units 391 - 420: 9 units   ____________   Goals of DM therapy:  Morning Fasting blood sugar: 80-140  Blood sugar before meals: 80-140 Bed time blood sugar: 100-150  A1C <7%, limited only by hypoglycemia  1.Diabetes medications and their side effects discussed, including hypoglycemia    2. Check blood glucose:  a) Always check blood sugars before driving. Please see below (under hypoglycemia) on how to manage b) Check a minimum of 3 times/day or more as needed when having symptoms of hypoglycemia.   c) Try to check blood glucose before sleeping/in the middle of the night to ensure that it is remaining stable and not dropping less than 100 d) Check blood glucose more often if sick  3. Diet: a) 3 meals per day schedule b: Restrict carbs to 60-70 grams (4 servings) per meal c) Colorful vegetables - 3 servings a day, and low sugar fruit 2 servings/day Plate control method: 1/4 plate protein, 1/4 starch, 1/2 green, yellow, or red vegetables d) Avoid carbohydrate snacks unless hypoglycemic episode, or increased physical activity  4. Regular exercise as tolerated, preferably 3 or more hours a week  5. Hypoglycemia: a)  Do not drive or operate machinery without first testing blood glucose to assure it is over 90 mg%, or if dizzy, lightheaded, not feeling normal, etc, or  if foot or leg is numb or weak. b)  If blood glucose less than 70, take four 5gm Glucose tabs or 15-30 gm Glucose gel.  Repeat every 15 min as needed until blood sugar is >100 mg/dl. If hypoglycemia persists then call 911.   6. Sick day management: a) Check blood  glucose more often b) Continue usual therapy if blood sugars are elevated.   7. Contact the doctor immediately if blood glucose is frequently <60 mg/dl, or an episode of severe hypoglycemia occurs (where someone had to give you glucose/  glucagon or if you passed out from a low blood glucose), or if blood glucose is persistently >350 mg/dl, for further management  8. A change in level of physical activity or exercise and a change in diet may also affect your blood sugar. Check blood sugars more often and call if needed.  Instructions: 1. Bring glucose meter, blood glucose records on every visit for review 2. Continue to follow up with primary care physician and other providers for medical care 3. Yearly eye  and foot exam 4. Please get blood work done prior to the next appointment

## 2023-09-17 NOTE — Telephone Encounter (Signed)
 The pt is due for repeat hepatic function panel- order has been entered. Called pt to make him aware. No answer no voice mail   I will send a message to his My Chart- he does view  messages.

## 2023-09-17 NOTE — Progress Notes (Signed)
 Outpatient Endocrinology Note Altamese Canadian, MD  09/20/23   Steve Romero 01-Mar-1971 [id number]  Referring Provider: Raliegh Ip, DO Primary Care Provider: Raliegh Ip, DO Reason for consultation: Subjective   Assessment & Plan  Diagnoses and all orders for this visit:  Uncontrolled type 2 diabetes mellitus with hyperglycemia (HCC) -     Ambulatory referral to diabetic education  Long-term insulin use (HCC)  Pure hypercholesterolemia  Other orders -     Glucagon (BAQSIMI ONE PACK) 3 MG/DOSE POWD; Place 1 Device into the nose as needed (Low blood sugar with impaired consciousness).   Diabetes Type II complicated by neuropathy,  Lab Results  Component Value Date   GFR 101.48 06/08/2023   Hba1c goal less than 7, current Hba1c is  Lab Results  Component Value Date   HGBA1C 13.7 (H) 09/14/2023   Will recommend the following: Semglee 50 units bid Novolog 20 units tid 15 min before meals  Correction scale: Use in addition to your meal time/short acting insulin based on blood sugars as follows:  151 - 180: 1 unit 181 - 210: 2 units 211 - 240: 3 units 241 - 270: 4 units 271 - 300: 5 units 301 - 330: 6 units 331 - 360: 7 units 361 - 390: 8 units 391 - 420: 9 units   No known contraindications/side effects to any of above medications Glucagon discussed and prescribed with refills on 09/20/23  -Last LD and Tg are as follows: Lab Results  Component Value Date   LDLCALC 98 09/14/2023    Lab Results  Component Value Date   TRIG 72 09/14/2023   -On rosuvastatin 10 mg QD -Follow low fat diet and exercise   -Blood pressure goal <140/90 - Microalbumin/creatinine goal is < 30 -Last MA/Cr is as follows: Lab Results  Component Value Date   MICROALBUR 0.5 09/14/2023   -not on ACE/ARB  -diet changes including salt restriction -limit eating outside -counseled BP targets per standards of diabetes care -uncontrolled blood pressure can lead  to retinopathy, nephropathy and cardiovascular and atherosclerotic heart disease  Reviewed and counseled on: -A1C target -Blood sugar targets -Complications of uncontrolled diabetes  -Checking blood sugar before meals and bedtime and bring log next visit -All medications with mechanism of action and side effects -Hypoglycemia management: rule of 15's, Glucagon Emergency Kit and medical alert ID -low-carb low-fat plate-method diet -At least 20 minutes of physical activity per day -Annual dilated retinal eye exam and foot exam -compliance and follow up needs -follow up as scheduled or earlier if problem gets worse  Call if blood sugar is less than 70 or consistently above 250    Take a 15 gm snack of carbohydrate at bedtime before you go to sleep if your blood sugar is less than 100.    If you are going to fast after midnight for a test or procedure, ask your physician for instructions on how to reduce/decrease your insulin dose.    Call if blood sugar is less than 70 or consistently above 250  -Treating a low sugar by rule of 15  (15 gms of sugar every 15 min until sugar is more than 70) If you feel your sugar is low, test your sugar to be sure If your sugar is low (less than 70), then take 15 grams of a fast acting Carbohydrate (3-4 glucose tablets or glucose gel or 4 ounces of juice or regular soda) Recheck your sugar 15 min after treating low to  make sure it is more than 70 If sugar is still less than 70, treat again with 15 grams of carbohydrate          Don't drive the hour of hypoglycemia  If unconscious/unable to eat or drink by mouth, use glucagon injection or nasal spray baqsimi and call 911. Can repeat again in 15 min if still unconscious.  Return in about 12 days (around 09/29/2023).   I have reviewed current medications, nurse's notes, allergies, vital signs, past medical and surgical history, family medical history, and social history for this encounter. Counseled patient  on symptoms, examination findings, lab findings, imaging results, treatment decisions and monitoring and prognosis. The patient understood the recommendations and agrees with the treatment plan. All questions regarding treatment plan were fully answered.  Altamese [place], MD  09/20/23    History of Present Illness Steve Romero is a 53 y.o. year old male who presents for evaluation of Type II diabetes mellitus.  Home diabetes regimen: Semglee 40 units bid Novolog 15-20 units tid  COMPLICATIONS -  MI/Stroke -  retinopathy +  neuropathy -  nephropathy  SYMPTOMS REVIEWED + Blurred vision  BLOOD SUGAR DATA   CGM interpretation: At today's visit, we reviewed her CGM downloads. The full report is scanned in the media. Reviewing the CGM trends, BG are elevated across the day.  Physical Exam  BP 122/82   Pulse 82   Ht 5\' 9"  (1.753 m)   Wt 165 lb (74.8 kg)   SpO2 96%   BMI 24.37 kg/m    Constitutional: well developed, well nourished Head: normocephalic, atraumatic Eyes: sclera anicteric, no redness Neck: supple Lungs: normal respiratory effort Neurology: alert and oriented Skin: dry, no appreciable rashes Musculoskeletal: no appreciable defects Psychiatric: normal mood and affect Diabetic Foot Exam - Simple   No data filed      Current Medications Patient's Medications  New Prescriptions   GLUCAGON (BAQSIMI ONE PACK) 3 MG/DOSE POWD    Place 1 Device into the nose as needed (Low blood sugar with impaired consciousness).  Previous Medications   ACETAMINOPHEN (TYLENOL) 500 MG TABLET    Take 2 tablets (1,000 mg total) by mouth every 8 (eight) hours as needed for mild pain.   ALBUTEROL (VENTOLIN HFA) 108 (90 BASE) MCG/ACT INHALER    Inhale 2 puffs into the lungs every 6 (six) hours as needed for wheezing or shortness of breath.   ASPIRIN EC 81 MG TABLET    Take 81-162 mg by mouth daily as needed (for pain). Swallow whole.   CONTINUOUS BLOOD GLUC RECEIVER (FREESTYLE LIBRE  READER) DEVI    1 Units by Does not apply route daily. UAD to test BGs daily. Dx E11.9   CONTINUOUS GLUCOSE SENSOR (FREESTYLE LIBRE 3 PLUS SENSOR) MISC    Check BGs continuously. E11.9 Change sensor every 15 days.   ESOMEPRAZOLE (NEXIUM) 20 MG CAPSULE    Take 1 capsule (20 mg total) by mouth daily.   INSULIN PEN NEEDLE (BD PEN NEEDLE NANO U/F) 32G X 4 MM MISC    UAD to administer insulin   LIPASE/PROTEASE/AMYLASE (CREON) 36000 UNITS CPEP CAPSULE    Take 3 capsules with meals and 1-2  capsule with snacks ( up too 2 snacks )   NALOXONE (NARCAN) NASAL SPRAY 4 MG/0.1 ML    Place 1 spray into the nose as directed.   NOVOLOG RELION 100 UNIT/ML INJECTION    Inject 5-15 Units into the skin See admin instructions. Inject 15 units into  the skin three times a day with meals and 5-10 units as needed with snacks, depending on BGL reading   ONDANSETRON (ZOFRAN) 4 MG TABLET    Take 4 mg by mouth every 6 (six) hours as needed for nausea or vomiting.   OXYCODONE HCL 10 MG TABS    Take 1 tablet (10 mg total) by mouth every 6 (six) hours as needed.   ROSUVASTATIN (CRESTOR) 10 MG TABLET    Take 1 tablet (10 mg total) by mouth at bedtime. If you experience muscle aches take every other day   SEMGLEE, YFGN, 100 UNIT/ML PEN    Inject 40 Units into the skin 2 (two) times daily.  Modified Medications   No medications on file  Discontinued Medications   No medications on file    Allergies Allergies  Allergen Reactions   Desipramine Nausea Only and Rash    Past Medical History Past Medical History:  Diagnosis Date   Acute pancreatitis without necrosis or infection, unspecified    AKI (acute kidney injury) (HCC) 05/17/2022   Anxiety 10/2014   Ascites    Bipolar disorder (HCC) age 17   COPD (chronic obstructive pulmonary disease) (HCC) 2016   Depression age 76   Enlarged prostate    Hyperlipidemia 2016   Hypertension 2013   Nausea vomiting and diarrhea 06/15/2022   Schizoaffective disorder (HCC) 11/13/2014    Sleep apnea    Does not wear c-pap, sleeps in sitting up position per pt   Thyroid disease 11/2014    Past Surgical History Past Surgical History:  Procedure Laterality Date   BALLOON DILATION N/A 11/07/2020   Procedure: BALLOON DILATION;  Surgeon: Lemar Lofty., MD;  Location[facility] ENDOSCOPY;  Service: Gastroenterology;  Laterality: N/A;   BALLOON DILATION N/A 06/26/2021   Procedure: BALLOON DILATION;  Surgeon: Meridee Score Netty Starring., MD;  [facility];  Service: Gastroenterology;  Laterality: N/A;   BILIARY DILATION  07/24/2021   Procedure: BILIARY DILATION;  Surgeon: Meridee Score Netty Starring., MD;  Location: [facility] ENDOSCOPY;  Service: Gastroenterology;;   BILIARY DILATION  05/22/2022   Procedure: BILIARY DILATION;  Surgeon: Lemar Lofty., MD;  Location: [facility] ENDOSCOPY;  Service: Gastroenterology;;   BILIARY DILATION  06/16/2022   Procedure: GASTROSTOMY DILATION;  Surgeon: Lemar Lofty., MD;  Location: [facility] ENDOSCOPY;  Service: Gastroenterology;;   BILIARY STENT PLACEMENT N/A 10/10/2020   Procedure: BILIARY STENT PLACEMENT;  Surgeon: Malissa Hippo, MD;  Location: [facility];  Service: Endoscopy;  Laterality: N/A;   BILIARY STENT PLACEMENT  11/09/2020   Procedure: BILIARY STENT PLACEMENT;  Surgeon: Meridee Score Netty Starring., MD;  Location: [facility] ENDOSCOPY;  Service: Gastroenterology;;   BILIARY STENT PLACEMENT  11/07/2020   Procedure: BILIARY STENT PLACEMENT;  Surgeon: Lemar Lofty., MD;  Location: [facility] ENDOSCOPY;  Service: Gastroenterology;;   BILIARY STENT PLACEMENT  11/11/2020   Procedure: BILIARY STENT PLACEMENT;  Surgeon: Lemar Lofty., MD;  Location: [facility] ENDOSCOPY;  Service: Gastroenterology;;   BILIARY STENT PLACEMENT  11/14/2020   Procedure: BILIARY STENT PLACEMENT;  Surgeon: Lemar Lofty., MD;  Location: [facility] ENDOSCOPY;  Service: Gastroenterology;;   BILIARY STENT PLACEMENT N/A 01/29/2021   Procedure: BILIARY STENT PLACEMENT;   Surgeon: Lemar Lofty., MD;  Location: [facility] ENDOSCOPY;  Service: Gastroenterology;  Laterality: N/A;   BILIARY STENT PLACEMENT  06/26/2021   Procedure: BILIARY STENT PLACEMENT;  Surgeon: Meridee Score Netty Starring., MD;  Location: [facility] ENDOSCOPY;  Service: Gastroenterology;;   BILIARY STENT PLACEMENT  07/24/2021   Procedure: BILIARY STENT PLACEMENT;  Surgeon: Lemar Lofty., MD;  Location: [facility] ENDOSCOPY;  Service: Gastroenterology;;   BIOPSY  11/07/2020   Procedure: BIOPSY;  Surgeon: Lemar Lofty., MD;  Location: [facility] ENDOSCOPY;  Service: Gastroenterology;;   BIOPSY  12/20/2020   Procedure: BIOPSY;  Surgeon: Lemar Lofty., MD;  Location: [facility] ENDOSCOPY;  Service: Gastroenterology;;   BIOPSY  06/26/2021   Procedure: BIOPSY;  Surgeon: Lemar Lofty., MD;  Location: [facility] ENDOSCOPY;  Service: Gastroenterology;;   CARPAL TUNNEL RELEASE Left 02/21/2016   Procedure: CARPAL TUNNEL RELEASE;  Surgeon: Vickki Hearing, MD;  Location: [facility];  Service: Orthopedics;  Laterality: Left;   CHOLECYSTECTOMY N/A 10/09/2020   Procedure: LAPAROSCOPIC CHOLECYSTECTOMY;  Surgeon: Lucretia Roers, MD;  Location: [facility];  Service: General;  Laterality: N/A;   CYST ENTEROSTOMY N/A 11/07/2020   Procedure: CYST ENTEROSTOMY;  Surgeon: Lemar Lofty., MD;  Location: [facility] ENDOSCOPY;  Service: Gastroenterology;  Laterality: N/A;   CYST GASTROSTOMY  11/11/2020   Procedure: CYST NECROSECTOMY;  Surgeon: Meridee Score Netty Starring., MD;  Location: [facility] ENDOSCOPY;  Service: Gastroenterology;;   CYST GASTROSTOMY  11/14/2020   Procedure: CYST NECROSECTOMY;  Surgeon: Lemar Lofty., MD;  Location: [facility] ENDOSCOPY;  Service: Gastroenterology;;   CYST GASTROSTOMY  06/16/2022   Procedure: CYST GASTROSTOMY;  Surgeon: Lemar Lofty., MD;  Location: [facility] ENDOSCOPY;  Service: Gastroenterology;;   CYST REMOVAL HAND     CYSTOSCOPY  06/26/2021   Procedure: CYSTOGASTROSTOMY;  Surgeon:  Mansouraty, Netty Starring., MD;  Location: [facility] ENDOSCOPY;  Service: Gastroenterology;;   ENDOSCOPIC RETROGRADE CHOLANGIOPANCREATOGRAPHY (ERCP) WITH PROPOFOL N/A 11/09/2020   Procedure: ENDOSCOPIC RETROGRADE CHOLANGIOPANCREATOGRAPHY (ERCP) WITH PROPOFOL;  Surgeon: Lemar Lofty., MD;  Location: [facility] ENDOSCOPY;  Service: Gastroenterology;  Laterality: N/A;   ENDOSCOPIC RETROGRADE CHOLANGIOPANCREATOGRAPHY (ERCP) WITH PROPOFOL N/A 12/20/2020   Procedure: ENDOSCOPIC RETROGRADE CHOLANGIOPANCREATOGRAPHY (ERCP) WITH PROPOFOL;  Surgeon: Meridee Score Netty Starring., MD;  Location: [facility] ENDOSCOPY;  Service: Gastroenterology;  Laterality: N/A;   ENDOSCOPIC RETROGRADE CHOLANGIOPANCREATOGRAPHY (ERCP) WITH PROPOFOL N/A 01/29/2021   Procedure: ENDOSCOPIC RETROGRADE CHOLANGIOPANCREATOGRAPHY (ERCP) WITH PROPOFOL;  Surgeon: Meridee Score Netty Starring., MD;  Location: [facility] ENDOSCOPY;  Service: Gastroenterology;  Laterality: N/A;   ENDOSCOPIC RETROGRADE CHOLANGIOPANCREATOGRAPHY (ERCP) WITH PROPOFOL N/A 07/24/2021   Procedure: ENDOSCOPIC RETROGRADE CHOLANGIOPANCREATOGRAPHY (ERCP) WITH PROPOFOL;  Surgeon: Meridee Score Netty Starring., MD;  Location: [facility] ENDOSCOPY;  Service: Gastroenterology;  Laterality: N/A;   ERCP N/A 10/10/2020   Procedure: ENDOSCOPIC RETROGRADE CHOLANGIOPANCREATOGRAPHY (ERCP);  Surgeon: Malissa Hippo, MD;  Location: [facility];  Service: Endoscopy;  Laterality: N/A;   ERCP     ERCP N/A 05/22/2022   Procedure: ENDOSCOPIC RETROGRADE CHOLANGIOPANCREATOGRAPHY (ERCP);  Surgeon: Lemar Lofty., MD;  Location: [facility] ENDOSCOPY;  Service: Gastroenterology;  Laterality: N/A;   ESOPHAGOGASTRODUODENOSCOPY N/A 11/11/2020   Procedure: ESOPHAGOGASTRODUODENOSCOPY (EGD);  Surgeon: Lemar Lofty., MD;  Location: [facility] ENDOSCOPY;  Service: Gastroenterology;  Laterality: N/A;   ESOPHAGOGASTRODUODENOSCOPY N/A 11/14/2020   Procedure: ESOPHAGOGASTRODUODENOSCOPY (EGD);  Surgeon: Lemar Lofty., MD;  Location: [facility]  ENDOSCOPY;  Service: Gastroenterology;  Laterality: N/A;   ESOPHAGOGASTRODUODENOSCOPY (EGD) WITH PROPOFOL N/A 11/09/2020   Procedure: ESOPHAGOGASTRODUODENOSCOPY (EGD) WITH PROPOFOL;  Surgeon: Meridee Score Netty Starring., MD;  Location: [facility] ENDOSCOPY;  Service: Gastroenterology;  Laterality: N/A;   ESOPHAGOGASTRODUODENOSCOPY (EGD) WITH PROPOFOL N/A 11/07/2020   Procedure: ESOPHAGOGASTRODUODENOSCOPY (EGD) WITH PROPOFOL;  Surgeon: Meridee Score Netty Starring., MD;  Location: [facility] ENDOSCOPY;  Service: Gastroenterology;  Laterality: N/A;   ESOPHAGOGASTRODUODENOSCOPY (EGD) WITH PROPOFOL N/A 12/20/2020   Procedure: ESOPHAGOGASTRODUODENOSCOPY (EGD) WITH PROPOFOL;  Surgeon: Lemar Lofty., MD;  Location: [facility] ENDOSCOPY;  Service: Gastroenterology;  Laterality: N/A;   ESOPHAGOGASTRODUODENOSCOPY (EGD) WITH PROPOFOL N/A 06/26/2021   Procedure: ESOPHAGOGASTRODUODENOSCOPY (EGD) WITH PROPOFOL;  Surgeon: Meridee Score Netty Starring., MD;  Location: [name] ENDOSCOPY;  Service: Gastroenterology;  Laterality: N/A;   ESOPHAGOGASTRODUODENOSCOPY (EGD) WITH PROPOFOL N/A 07/24/2021   Procedure: ESOPHAGOGASTRODUODENOSCOPY (EGD) WITH PROPOFOL;  Surgeon: Meridee Score Netty Starring., MD;  Location: [facility] ENDOSCOPY;  Service: Gastroenterology;  Laterality: N/A;  AXIOS STENT   ESOPHAGOGASTRODUODENOSCOPY (EGD) WITH PROPOFOL N/A 05/22/2022   Procedure: ESOPHAGOGASTRODUODENOSCOPY (EGD) WITH PROPOFOL;  Surgeon: Meridee Score Netty Starring., MD;  Location: [facility] ENDOSCOPY;  Service: Gastroenterology;  Laterality: N/A;   ESOPHAGOGASTRODUODENOSCOPY (EGD) WITH PROPOFOL N/A 06/16/2022   Procedure: ESOPHAGOGASTRODUODENOSCOPY (EGD) WITH PROPOFOL;  Surgeon: Meridee Score Netty Starring., MD;  Location: [facility] ENDOSCOPY;  Service: Gastroenterology;  Laterality: N/A;   ESOPHAGOGASTRODUODENOSCOPY (EGD) WITH PROPOFOL N/A 07/30/2022   Procedure: ESOPHAGOGASTRODUODENOSCOPY (EGD) WITH PROPOFOL;  Surgeon: Meridee Score Netty Starring., MD;  Location: [facility] ENDOSCOPY;  Service: Gastroenterology;   Laterality: N/A;   ESOPHAGOGASTRODUODENOSCOPY (EGD) WITH PROPOFOL N/A 03/30/2023   Procedure: ESOPHAGOGASTRODUODENOSCOPY (EGD) WITH PROPOFOL;  Surgeon: Iva Boop, MD;  Location: [facility] ENDOSCOPY;  Service: Gastroenterology;  Laterality: N/A;   EUS  11/14/2020   Procedure: UPPER ENDOSCOPIC ULTRASOUND (EUS) LINEAR;  Surgeon: Lemar Lofty., MD;  Location: [facility] ENDOSCOPY;  Service: Gastroenterology;;   EUS N/A 06/26/2021   Procedure: UPPER ENDOSCOPIC ULTRASOUND (EUS) RADIAL;  Surgeon: Lemar Lofty., MD;  Location: [facility] ENDOSCOPY;  Service: Gastroenterology;  Laterality: N/A;   EUS N/A 05/22/2022   Procedure: UPPER ENDOSCOPIC ULTRASOUND (EUS) LINEAR;  Surgeon: Lemar Lofty., MD;  Location: [facility] ENDOSCOPY;  Service: Gastroenterology;  Laterality: N/A;   EUS N/A 06/16/2022   Procedure: UPPER ENDOSCOPIC ULTRASOUND (EUS) LINEAR;  Surgeon: Lemar Lofty., MD;  Location: [facility] ENDOSCOPY;  Service: Gastroenterology;  Laterality: N/A;   FINE NEEDLE ASPIRATION  05/22/2022   Procedure: FINE NEEDLE ASPIRATION;  Surgeon: Meridee Score Netty Starring., MD;  Location: [facility] ENDOSCOPY;  Service: Gastroenterology;;   GASTROINTESTINAL STENT REMOVAL  12/20/2020   Procedure: GASTROINTESTINAL STENT REMOVAL;  Surgeon: Lemar Lofty., MD;  Location: [facility] ENDOSCOPY;  Service: Gastroenterology;;  cyst gastrostomy stent and double pig tail stents x2 removed   HERNIA REPAIR Left 2002   groin   INGUINAL HERNIA REPAIR Left 04/05/2017   Procedure: RECURRENT HERNIA REPAIR INGUINAL ADULT WITH MESH;  Surgeon: Franky Macho, MD;  Location: [facility];  Service: General;  Laterality: Left;   MASS EXCISION Right 04/29/2020   Procedure: EXCISION MASS RIGHT WRIST;  Surgeon: Betha Loa, MD;  Location: [facility];  Service: Orthopedics;  Laterality: Right;   MASS EXCISION Right 10/09/2020   Procedure: EXCISION MASS, ABDOMINAL WALL, 2CM;  Surgeon: Lucretia Roers, MD;  Location: [facility];   Service: General;  Laterality: Right;   PANCREATIC STENT PLACEMENT  06/26/2021   Procedure: AXIOS STENT PLACEMENT;  Surgeon: Lemar Lofty., MD;  Location: [facility] ENDOSCOPY;  Service: Gastroenterology;;   PANCREATIC STENT PLACEMENT  06/16/2022   Procedure: PANCREATIC STENT PLACEMENT;  Surgeon: Lemar Lofty., MD;  Location: [facility] ENDOSCOPY;  Service: Gastroenterology;;   REMOVAL OF STONES  12/20/2020   Procedure: REMOVAL OF STONES;  Surgeon: Lemar Lofty., MD;  Location: [facility] ENDOSCOPY;  Service: Gastroenterology;;   REMOVAL OF STONES  07/24/2021   Procedure: REMOVAL OF STONES;  Surgeon: Lemar Lofty., MD;  Location: [facility] ENDOSCOPY;  Service: Gastroenterology;;   REMOVAL OF STONES  05/22/2022   Procedure: REMOVAL OF STONES;  Surgeon: Lemar Lofty., MD;  Location: [facility] ENDOSCOPY;  Service: Gastroenterology;;   SPHINCTEROTOMY N/A 10/10/2020   Procedure: Dennison Mascot;  Surgeon: Malissa Hippo, MD;  Location: [facility];  Service: Endoscopy;  Laterality: N/A;   SPHINCTEROTOMY  07/24/2021   Procedure: SPHINCTEROTOMY;  Surgeon: Mansouraty, Netty Starring., MD;  Location: [facility] ENDOSCOPY;  Service: Gastroenterology;;   SPLENECTOMY, TOTAL N/A 11/17/2022   Procedure: SPLENECTOMY;  Surgeon: Fritzi Mandes, MD;  Location: [facility] OR;  Service: General;  Laterality: N/A;   SPYGLASS CHOLANGIOSCOPY N/A 12/20/2020   Procedure: NFAOZHYQ CHOLANGIOSCOPY;  Surgeon: Lemar Lofty., MD;  Location: [facility] ENDOSCOPY;  Service: Gastroenterology;  Laterality: N/A;   SPYGLASS CHOLANGIOSCOPY N/A 07/24/2021   Procedure: SPYGLASS CHOLANGIOSCOPY;  Surgeon: Lemar Lofty., MD;  Location: [facility] ENDOSCOPY;  Service: Gastroenterology;  Laterality: N/A;   STENT REMOVAL  11/09/2020   Procedure: STENT REMOVAL;  Surgeon: Lemar Lofty., MD;  Location: [facility] ENDOSCOPY;  Service: Gastroenterology;;   Francine Graven REMOVAL  11/11/2020   Procedure: STENT REMOVAL;  Surgeon: Lemar Lofty., MD;   Location: [facility] ENDOSCOPY;  Service: Gastroenterology;;   Francine Graven REMOVAL  11/14/2020   Procedure: STENT REMOVAL;  Surgeon: Lemar Lofty., MD;  Location: [facility] ENDOSCOPY;  Service: Gastroenterology;;   Francine Graven REMOVAL  12/20/2020   Procedure: STENT REMOVAL;  Surgeon: Lemar Lofty., MD;  Location: [facility] ENDOSCOPY;  Service: Gastroenterology;;  biliary x2   STENT REMOVAL  07/24/2021   Procedure: AXIOS STENT REMOVAL;  Surgeon: Lemar Lofty., MD;  Location: [facility] ENDOSCOPY;  Service: Gastroenterology;;   Francine Graven REMOVAL  07/30/2022   Procedure: STENT REMOVAL;  Surgeon: Lemar Lofty., MD;  Location: [facility] ENDOSCOPY;  Service: Gastroenterology;;   UPPER ESOPHAGEAL ENDOSCOPIC ULTRASOUND (EUS) N/A 11/07/2020   Procedure: UPPER ESOPHAGEAL ENDOSCOPIC ULTRASOUND (EUS);  Surgeon: Lemar Lofty., MD;  Location: [facility] ENDOSCOPY;  Service: Gastroenterology;  Laterality: N/A;   UPPER GASTROINTESTINAL ENDOSCOPY     WOUND DEBRIDEMENT  11/09/2020   Procedure: CYST NECROSECTOMY;  Surgeon: Meridee Score Netty Starring., MD;  Location: [facility] ENDOSCOPY;  Service: Gastroenterology;;    Family History family history includes Diabetes in his father; Heart disease in his father and maternal grandmother; Stroke in his maternal grandfather.  Social History Social History   Socioeconomic History   Marital status: Single    Spouse name: Not on file   Number of children: 0   Years of education: Not on file   Highest education level: GED or equivalent  Occupational History   Not on file  Tobacco Use   Smoking status: Every Day    Current packs/day: 0.25    Average packs/day: 0.3 packs/day for 26.0 years (6.5 ttl pk-yrs)    Types: Cigarettes   Smokeless tobacco: Never   Tobacco comments:    4-5 cig daily as of 10/07/2020.  Vaping Use   Vaping status: Never Used  Substance and Sexual Activity   Alcohol use: No   Drug use: No   Sexual activity: Never    Birth control/protection: None  Other  Topics Concern   Not on file  Social History Narrative   Not on file   Social Drivers of Health   Financial Resource Strain: Low Risk  (07/27/2023)   Overall Financial Resource Strain (CARDIA)    Difficulty of Paying Living Expenses: Not very hard  Food Insecurity: No Food Insecurity (08/16/2023)   Hunger Vital Sign    Worried About Running Out of Food in the Last Year: Never true    Ran Out of Food in the Last Year: Never  true  Recent Concern: Food Insecurity - Food Insecurity Present (07/27/2023)   Hunger Vital Sign    Worried About Running Out of Food in the Last Year: Never true    Ran Out of Food in the Last Year: Sometimes true  Transportation Needs: No Transportation Needs (08/16/2023)   PRAPARE - Administrator, Civil Service (Medical): No    Lack of Transportation (Non-Medical): No  Physical Activity: Insufficiently Active (07/27/2023)   Exercise Vital Sign    Days of Exercise per Week: 2 days    Minutes of Exercise per Session: 10 min  Stress: No Stress Concern Present (07/27/2023)   [employer] of Occupational Health - Occupational Stress Questionnaire    Feeling of Stress : Only a little  Social Connections: Unknown (07/27/2023)   Social Connection and Isolation Panel [NHANES]    Frequency of Communication with Friends and Family: Three times a week    Frequency of Social Gatherings with Friends and Family: Once a week    Attends Religious Services: Patient declined    Active Member of Clubs or Organizations: No    Attends Banker Meetings: Not on file    Marital Status: Divorced  Intimate Partner Violence: Not At Risk (08/16/2023)   Humiliation, Afraid, Rape, and Kick questionnaire    Fear of Current or Ex-Partner: No    Emotionally Abused: No    Physically Abused: No    Sexually Abused: No    Lab Results  Component Value Date   HGBA1C 13.7 (H) 09/14/2023   HGBA1C >14.0 (H) 06/08/2023   HGBA1C 14.9 (H) 03/30/2023   Lab Results   Component Value Date   CHOL 158 09/14/2023   Lab Results  Component Value Date   HDL 44 09/14/2023   Lab Results  Component Value Date   LDLCALC 98 09/14/2023   Lab Results  Component Value Date   TRIG 72 09/14/2023   Lab Results  Component Value Date   CHOLHDL 3.6 09/14/2023   Lab Results  Component Value Date   CREATININE 0.79 09/14/2023   Lab Results  Component Value Date   GFR 101.48 06/08/2023   Lab Results  Component Value Date   MICROALBUR 0.5 09/14/2023      Component Value Date/Time   NA 136 09/14/2023 0940   NA 135 08/18/2023 1205   K 4.8 09/14/2023 0940   CL 98 09/14/2023 0940   CO2 32 09/14/2023 0940   GLUCOSE 302 (H) 09/14/2023 0940   BUN 16 09/14/2023 0940   BUN 15 08/18/2023 1205   CREATININE 0.79 09/14/2023 0940   CALCIUM 9.7 09/14/2023 0940   PROT 6.7 09/14/2023 0940   PROT 5.7 (L) 08/18/2023 1205   ALBUMIN 3.7 (L) 08/18/2023 1205   AST 23 09/14/2023 0940   ALT 50 (H) 09/14/2023 0940   ALKPHOS 647 (H) 08/18/2023 1205   BILITOT 0.5 09/14/2023 0940   BILITOT <0.2 08/18/2023 1205   GFRNONAA >60 08/13/2023 0450   GFRNONAA 89 04/22/2015 0828   GFRAA 85 10/04/2019 1433   GFRAA >89 04/22/2015 0828      Latest Ref Rng & Units 09/14/2023    9:40 AM 08/18/2023   12:05 PM 08/13/2023    4:50 AM  BMP  Glucose 65 - 99 mg/dL 914  782  956   BUN 7 - 25 mg/dL 16  15  18    Creatinine 0.70 - 1.30 mg/dL 2.13  0.86  5.78   BUN/Creat Ratio 6 - 22 (calc) SEE  NOTE:  20    Sodium 135 - 146 mmol/L 136  135  132   Potassium 3.5 - 5.3 mmol/L 4.8  4.7  3.5   Chloride 98 - 110 mmol/L 98  95  98   CO2 20 - 32 mmol/L 32  26  26   Calcium 8.6 - 10.3 mg/dL 9.7  9.4  8.4        Component Value Date/Time   WBC 14.2 (H) 08/18/2023 1205   WBC 13.6 (H) 08/12/2023 0257   RBC 4.36 08/18/2023 1205   RBC 4.92 08/12/2023 0257   HGB 13.8 08/18/2023 1205   HCT 41.0 08/18/2023 1205   PLT 484 (H) 08/18/2023 1205   MCV 94 08/18/2023 1205   MCH 31.7 08/18/2023 1205    MCH 31.7 08/12/2023 0257   MCHC 33.7 08/18/2023 1205   MCHC 35.1 08/12/2023 0257   RDW 12.4 08/18/2023 1205   LYMPHSABS 3.7 (H) 08/18/2023 1205   MONOABS 0.7 07/07/2023 1310   EOSABS 0.1 08/18/2023 1205   BASOSABS 0.1 08/18/2023 1205     Parts of this note may have been dictated using voice recognition software. There may be variances in spelling and vocabulary which are unintentional. Not all errors are proofread. Please notify the Thereasa Parkin if any discrepancies are noted or if the meaning of any statement is not clear.

## 2023-09-17 NOTE — Progress Notes (Signed)
 Attempted to obtain medical history for pre op call via telephone, unable to reach at this time. HIPAA compliant voicemail message left requesting return call to pre surgical testing department.

## 2023-09-17 NOTE — Telephone Encounter (Signed)
-----   Message from [facility] Loysie P sent at 09/17/2023  4:26 PM EDT ----- Spoke with this patient to confirm his appointment on 09/23/23 with Dr.Mansouraty. Patient stated that he was supposed to have repeat labs are you aware if any labs that are supposed to be  repeated?

## 2023-09-17 NOTE — Telephone Encounter (Signed)
 Procedure:EGD Procedure date: 09/23/23 Procedure location: [facility] Arrival Time: 11:45am Spoke with the patient Y/N: Yes Any prep concerns? No prep concerns  Has the patient obtained the prep from the pharmacy ? N/A Do you have a care partner and transportation: yes  Any additional concerns? Patient asked if he was supposed to have lab work before his procedure. A staff message was sent to Dr.Mansouraty's nurse to check if the patient needed labs.

## 2023-09-20 ENCOUNTER — Other Ambulatory Visit: Payer: Self-pay | Admitting: Family Medicine

## 2023-09-20 DIAGNOSIS — E119 Type 2 diabetes mellitus without complications: Secondary | ICD-10-CM

## 2023-09-22 ENCOUNTER — Other Ambulatory Visit: Payer: Self-pay

## 2023-09-22 DIAGNOSIS — K859 Acute pancreatitis without necrosis or infection, unspecified: Secondary | ICD-10-CM

## 2023-09-22 DIAGNOSIS — K861 Other chronic pancreatitis: Secondary | ICD-10-CM

## 2023-09-22 DIAGNOSIS — K863 Pseudocyst of pancreas: Secondary | ICD-10-CM

## 2023-09-22 DIAGNOSIS — R634 Abnormal weight loss: Secondary | ICD-10-CM

## 2023-09-22 DIAGNOSIS — K831 Obstruction of bile duct: Secondary | ICD-10-CM

## 2023-09-23 ENCOUNTER — Encounter (HOSPITAL_COMMUNITY): Payer: Self-pay | Admitting: Gastroenterology

## 2023-09-23 ENCOUNTER — Ambulatory Visit (HOSPITAL_COMMUNITY)
Admission: RE | Admit: 2023-09-23 | Discharge: 2023-09-24 | Disposition: A | Discharge status: Home / Self Care | Payer: MEDICAID | Attending: Gastroenterology | Admitting: Gastroenterology

## 2023-09-23 ENCOUNTER — Ambulatory Visit (HOSPITAL_BASED_OUTPATIENT_CLINIC_OR_DEPARTMENT_OTHER): Payer: MEDICAID | Admitting: Anesthesiology

## 2023-09-23 ENCOUNTER — Ambulatory Visit (HOSPITAL_COMMUNITY): Payer: MEDICAID | Admitting: Anesthesiology

## 2023-09-23 ENCOUNTER — Ambulatory Visit (HOSPITAL_COMMUNITY): Payer: MEDICAID

## 2023-09-23 ENCOUNTER — Other Ambulatory Visit: Payer: Self-pay

## 2023-09-23 ENCOUNTER — Encounter (HOSPITAL_COMMUNITY)
Admission: RE | Disposition: A | Discharge status: Home / Self Care | Payer: Self-pay | Source: Home / Self Care | Attending: Gastroenterology

## 2023-09-23 DIAGNOSIS — G4730 Sleep apnea, unspecified: Secondary | ICD-10-CM | POA: Insufficient documentation

## 2023-09-23 DIAGNOSIS — R933 Abnormal findings on diagnostic imaging of other parts of digestive tract: Secondary | ICD-10-CM | POA: Diagnosis not present

## 2023-09-23 DIAGNOSIS — K3189 Other diseases of stomach and duodenum: Secondary | ICD-10-CM | POA: Diagnosis not present

## 2023-09-23 DIAGNOSIS — K831 Obstruction of bile duct: Secondary | ICD-10-CM

## 2023-09-23 DIAGNOSIS — J449 Chronic obstructive pulmonary disease, unspecified: Secondary | ICD-10-CM

## 2023-09-23 DIAGNOSIS — K838 Other specified diseases of biliary tract: Secondary | ICD-10-CM | POA: Diagnosis not present

## 2023-09-23 DIAGNOSIS — E119 Type 2 diabetes mellitus without complications: Secondary | ICD-10-CM | POA: Insufficient documentation

## 2023-09-23 DIAGNOSIS — Z9889 Other specified postprocedural states: Secondary | ICD-10-CM | POA: Diagnosis not present

## 2023-09-23 DIAGNOSIS — F17210 Nicotine dependence, cigarettes, uncomplicated: Secondary | ICD-10-CM | POA: Diagnosis not present

## 2023-09-23 DIAGNOSIS — K861 Other chronic pancreatitis: Secondary | ICD-10-CM

## 2023-09-23 DIAGNOSIS — K863 Pseudocyst of pancreas: Secondary | ICD-10-CM

## 2023-09-23 DIAGNOSIS — K859 Acute pancreatitis without necrosis or infection, unspecified: Secondary | ICD-10-CM

## 2023-09-23 DIAGNOSIS — G8918 Other acute postprocedural pain: Secondary | ICD-10-CM | POA: Diagnosis present

## 2023-09-23 DIAGNOSIS — Z9049 Acquired absence of other specified parts of digestive tract: Secondary | ICD-10-CM | POA: Insufficient documentation

## 2023-09-23 DIAGNOSIS — I2510 Atherosclerotic heart disease of native coronary artery without angina pectoris: Secondary | ICD-10-CM | POA: Diagnosis not present

## 2023-09-23 DIAGNOSIS — I10 Essential (primary) hypertension: Secondary | ICD-10-CM | POA: Diagnosis not present

## 2023-09-23 DIAGNOSIS — K8689 Other specified diseases of pancreas: Secondary | ICD-10-CM

## 2023-09-23 DIAGNOSIS — E785 Hyperlipidemia, unspecified: Secondary | ICD-10-CM | POA: Diagnosis not present

## 2023-09-23 DIAGNOSIS — R7989 Other specified abnormal findings of blood chemistry: Principal | ICD-10-CM

## 2023-09-23 DIAGNOSIS — R945 Abnormal results of liver function studies: Secondary | ICD-10-CM | POA: Insufficient documentation

## 2023-09-23 DIAGNOSIS — R109 Unspecified abdominal pain: Secondary | ICD-10-CM | POA: Diagnosis present

## 2023-09-23 HISTORY — PX: BILIARY STENT PLACEMENT: SHX5538

## 2023-09-23 HISTORY — PX: ENDOSCOPIC RETROGRADE CHOLANGIOPANCREATOGRAPHY (ERCP) WITH PROPOFOL: SHX5810

## 2023-09-23 HISTORY — PX: BILIARY BRUSHING: SHX6843

## 2023-09-23 LAB — HEPATIC FUNCTION PANEL
ALT: 76 U/L — ABNORMAL HIGH (ref 0–44)
AST: 48 U/L — ABNORMAL HIGH (ref 15–41)
Albumin: 3.5 g/dL (ref 3.5–5.0)
Alkaline Phosphatase: 347 U/L — ABNORMAL HIGH (ref 38–126)
Bilirubin, Direct: 0.1 mg/dL (ref 0.0–0.2)
Indirect Bilirubin: 0.2 mg/dL — ABNORMAL LOW (ref 0.3–0.9)
Total Bilirubin: 0.3 mg/dL (ref 0.0–1.2)
Total Protein: 6.7 g/dL (ref 6.5–8.1)

## 2023-09-23 LAB — GLUCOSE, CAPILLARY: Glucose-Capillary: 183 mg/dL — ABNORMAL HIGH (ref 70–99)

## 2023-09-23 SURGERY — ENDOSCOPIC RETROGRADE CHOLANGIOPANCREATOGRAPHY (ERCP) WITH PROPOFOL
Anesthesia: General

## 2023-09-23 MED ORDER — OXYCODONE HCL 10 MG PO TABS: 10.0000 mg | ORAL_TABLET | Freq: Four times a day (QID) | ORAL | 0 refills | Status: AC | PRN

## 2023-09-23 MED ORDER — FENTANYL CITRATE (PF) 100 MCG/2ML IJ SOLN
25.0000 ug | Freq: Once | INTRAMUSCULAR | Status: AC
  Administered 2023-09-23: 25 ug via INTRAVENOUS

## 2023-09-23 MED ORDER — OXYCODONE HCL 5 MG PO TABS: 5.0000 mg | ORAL_TABLET | Freq: Once | ORAL | Status: DC | PRN

## 2023-09-23 MED ORDER — DICLOFENAC SUPPOSITORY 100 MG
RECTAL | Status: AC
  Filled 2023-09-23: qty 1

## 2023-09-23 MED ORDER — PROPOFOL 10 MG/ML IV BOLUS
INTRAVENOUS | Status: AC
  Filled 2023-09-23: qty 20

## 2023-09-23 MED ORDER — SODIUM CHLORIDE 0.9 % IV SOLN: INTRAVENOUS | Status: AC

## 2023-09-23 MED ORDER — GLUCAGON HCL RDNA (DIAGNOSTIC) 1 MG IJ SOLR
INTRAMUSCULAR | Status: AC
  Filled 2023-09-23: qty 2

## 2023-09-23 MED ORDER — FENTANYL CITRATE (PF) 100 MCG/2ML IJ SOLN
INTRAMUSCULAR | Status: AC
  Filled 2023-09-23: qty 2

## 2023-09-23 MED ORDER — ROCURONIUM BROMIDE 10 MG/ML (PF) SYRINGE
PREFILLED_SYRINGE | INTRAVENOUS | Status: DC | PRN
  Administered 2023-09-23: 50 mg via INTRAVENOUS
  Administered 2023-09-23: 20 mg via INTRAVENOUS

## 2023-09-23 MED ORDER — MEPERIDINE HCL 50 MG/ML IJ SOLN
6.2500 mg | INTRAMUSCULAR | Status: DC | PRN
Start: 2023-09-23 — End: 2023-09-24

## 2023-09-23 MED ORDER — SUGAMMADEX SODIUM 200 MG/2ML IV SOLN
INTRAVENOUS | Status: DC | PRN
  Administered 2023-09-23: 400 mg via INTRAVENOUS

## 2023-09-23 MED ORDER — DICLOFENAC SUPPOSITORY 100 MG
RECTAL | Status: DC | PRN
  Administered 2023-09-23: 100 mg via RECTAL

## 2023-09-23 MED ORDER — CIPROFLOXACIN IN D5W 400 MG/200ML IV SOLN
INTRAVENOUS | Status: AC
  Filled 2023-09-23: qty 200

## 2023-09-23 MED ORDER — MIDAZOLAM HCL 2 MG/2ML IJ SOLN
INTRAMUSCULAR | Status: AC
  Filled 2023-09-23: qty 2

## 2023-09-23 MED ORDER — SODIUM CHLORIDE 0.9 % IV SOLN
INTRAVENOUS | Status: DC | PRN
  Administered 2023-09-23: 32 mL

## 2023-09-23 MED ORDER — ONDANSETRON HCL 4 MG/2ML IJ SOLN
4.0000 mg | Freq: Once | INTRAMUSCULAR | Status: AC
  Administered 2023-09-23: 4 mg via INTRAVENOUS

## 2023-09-23 MED ORDER — SODIUM CHLORIDE 0.9 % IV SOLN: 12.5000 mg | INTRAVENOUS | Status: DC | PRN

## 2023-09-23 MED ORDER — AMISULPRIDE (ANTIEMETIC) 5 MG/2ML IV SOLN
10.0000 mg | Freq: Once | INTRAVENOUS | Status: AC | PRN
  Administered 2023-09-23: 10 mg via INTRAVENOUS

## 2023-09-23 MED ORDER — OXYCODONE HCL 5 MG/5ML PO SOLN: 5.0000 mg | Freq: Once | ORAL | Status: DC | PRN

## 2023-09-23 MED ORDER — PROPOFOL 10 MG/ML IV BOLUS
INTRAVENOUS | Status: DC | PRN
  Administered 2023-09-23: 150 mg via INTRAVENOUS

## 2023-09-23 MED ORDER — CIPROFLOXACIN IN D5W 400 MG/200ML IV SOLN
400.0000 mg | Freq: Once | INTRAVENOUS | Status: AC
  Administered 2023-09-23: 400 mg via INTRAVENOUS

## 2023-09-23 MED ORDER — ONDANSETRON HCL 4 MG/2ML IJ SOLN
INTRAMUSCULAR | Status: DC | PRN
  Administered 2023-09-23: 4 mg via INTRAVENOUS

## 2023-09-23 MED ORDER — ONDANSETRON HCL 4 MG/2ML IJ SOLN
INTRAMUSCULAR | Status: AC
Start: 2023-09-23 — End: ?
  Filled 2023-09-23: qty 2

## 2023-09-23 MED ORDER — MIDAZOLAM HCL 2 MG/2ML IJ SOLN
INTRAMUSCULAR | Status: DC | PRN
  Administered 2023-09-23: 2 mg via INTRAVENOUS

## 2023-09-23 MED ORDER — HYDROMORPHONE HCL 1 MG/ML IJ SOLN: 0.2500 mg | INTRAMUSCULAR | Status: DC | PRN

## 2023-09-23 MED ORDER — LIDOCAINE 2% (20 MG/ML) 5 ML SYRINGE
INTRAMUSCULAR | Status: DC | PRN
  Administered 2023-09-23: 60 mg via INTRAVENOUS

## 2023-09-23 MED ORDER — FENTANYL CITRATE (PF) 250 MCG/5ML IJ SOLN
INTRAMUSCULAR | Status: DC | PRN
  Administered 2023-09-23 (×2): 100 ug via INTRAVENOUS

## 2023-09-23 MED ORDER — AMISULPRIDE (ANTIEMETIC) 5 MG/2ML IV SOLN
INTRAVENOUS | Status: AC
  Filled 2023-09-23: qty 2

## 2023-09-23 NOTE — Op Note (Signed)
 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 Freida Busman CRNA, CRNA Referring MD:              Medicines:                General Anesthesia, Diclofenac 100 mg rectal, Cipro                            400 mg IV Complications:            No immediate complications. Estimated Blood Loss:     Estimated blood loss was minimal. Procedure:                Pre-Anesthesia Assessment:                           - Prior to the procedure, a History and Physical                            was performed, and patient medications and                            allergies were reviewed. The patient's tolerance of                            previous anesthesia was also reviewed. The risks                            and benefits of the procedure and the sedation                            options and risks were discussed with the patient.                            All questions were answered, and informed consent                            was obtained. Prior Anticoagulants: The patient has  taken no anticoagulant or antiplatelet agents                            except for aspirin. ASA Grade Assessment: III - A                            patient with severe systemic disease. After                            reviewing the risks and benefits, the patient was                            deemed in satisfactory condition to undergo the                             procedure.                           After obtaining informed consent, the scope was                            passed under direct vision. Throughout the                            procedure, the patient's blood pressure, pulse, and                            oxygen saturations were monitored continuously. The                            [employer] D single use                            duodenoscope was introduced through the mouth, and                            used to inject contrast into and used to cannulate                            the bile duct. The ERCP was accomplished without                            difficulty. The patient tolerated the procedure. Scope In: Scope Out: Findings:      A scout film of the abdomen was obtained. Surgical clips, consistent       with a previous cholecystectomy, were seen in the area of the right       upper quadrant of the abdomen.      The esophagus was successfully intubated under direct vision without       detailed examination of the pharynx, larynx, and associated structures.       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 A biliary       sphincterotomy had been performed. The sphincterotomy appeared open.      A 0.035 inch x 260 cm  straight Hydra Al Pimple was passed into the biliary       tree. The Hydratome sphincterotome was passed over the guidewire and the       bile duct was then deeply cannulated. Contrast was injected. I       personally interpreted the bile duct images. Ductal flow of contrast was       adequate. Image quality was adequate. Contrast extended to the hepatic       ducts. Opacification of the entire biliary tree except for the       gallbladder was successful. The lower third of the main bile duct       contained a single moderate stenosis 15 mm in length. The middle third       of the main bile duct and upper third of the main bile duct were       moderately dilated. The  largest diameter was 18 mm. To discover objects,       the biliary tree was swept with a retrieval balloon. Sludge was swept       from the duct. An occlusion cholangiogram was performed that showed no       further significant biliary pathology other than what was described       above. Cells for cytology were obtained by brushing in the stricture and       the distal bile duct. 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 The       stent was in good position.      A formal pancreatogram was not performed.      The duodenoscope was withdrawn from the patient. 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                           - Prior biliary sphincterotomy appeared open.                           - A single moderate biliary stricture was found in                            the lower third of the main bile duct. The                            stricture was inflammatory in appearance. This was                            brushed for cytology purposes.                           - The upper third of the main bile duct and middle                            third of the main bile duct were moderately dilated  likely due to stricture.                           - The biliary tree was swept and sludge was found.                           - One covered metal biliary stent was placed into                            the common bile duct to traverse the stenosis. Moderate Sedation:      Not Applicable - Patient had care per Anesthesia. Recommendation:           - The patient will be observed post-procedure,                            until all discharge criteria are met.                           - Discharge patient  to home.                           - Patient has a contact number available for                            emergencies. The signs and symptoms of potential                            delayed complications were discussed with the                            patient. Return to normal activities tomorrow.                            Written discharge instructions were provided to the                            patient.                           - Low fat diet.                           - Watch for pancreatitis, bleeding, perforation,                            and cholangitis.                           - Check liver enzymes (AST, ALT, alkaline                            phosphatase, bilirubin) in 2 weeks.                           - Observe patient's clinical course.                           -  Recommend CT abdomen pancreas protocol in 2                            months with follow-up in clinic.                           - Repeat ERCP in 4 to 6 months to remove stent.                           - The findings and recommendations were discussed                            with the patient.                           - The findings and recommendations were discussed                            with the patient's family. Procedure Code(s):        --- Professional ---                           [phone], Endoscopic retrograde                            cholangiopancreatography (ERCP); with placement of                            endoscopic stent into biliary or pancreatic duct,                            including pre- and post-dilation and guide wire                            passage, when performed, including sphincterotomy,                            when performed, each stent                           [id number], Endoscopic retrograde                            cholangiopancreatography (ERCP); with removal of                            calculi/debris from biliary/pancreatic duct(s)                            [id number], Endoscopic catheterization of the biliary                            ductal system, radiological supervision and                            interpretation Diagnosis Code(s):        ---  Professional ---                           K31.89, Other diseases of stomach and duodenum                           K83.1, Obstruction of bile duct                           K83.8, Other specified diseases of biliary tract                           R79.89, Other specified abnormal findings of blood                            chemistry CPT copyright 2022 [employer]. All rights reserved. The codes documented in this report are preliminary and upon coder review may  be revised to meet current compliance requirements. Corliss Parish, MD 09/23/2023 3:36:10 PM Number of Addenda: 0

## 2023-09-23 NOTE — Discharge Instructions (Signed)

## 2023-09-23 NOTE — Anesthesia Procedure Notes (Signed)
 Procedure Name: Intubation Date/Time: 09/23/2023 2:34 PM  Performed by: Elyn Peers, CRNAPre-anesthesia Checklist: Patient identified, Emergency Drugs available, Suction available, Patient being monitored and Timeout performed Patient Re-evaluated:Patient Re-evaluated prior to induction Oxygen Delivery Method: Circle system utilized Preoxygenation: Pre-oxygenation with 100% oxygen Induction Type: IV induction Ventilation: Mask ventilation without difficulty Laryngoscope Size: Miller and 3 Grade View: Grade I Tube type: Oral Tube size: 7.5 mm Number of attempts: 1 Airway Equipment and Method: Stylet Placement Confirmation: ETT inserted through vocal cords under direct vision, positive ETCO2 and breath sounds checked- equal and bilateral Secured at: 23 cm Tube secured with: Tape Dental Injury: Teeth and Oropharynx as per pre-operative assessment

## 2023-09-23 NOTE — Anesthesia Preprocedure Evaluation (Signed)
 Anesthesia Evaluation  Patient identified by MRN, date of birth, ID band Patient awake    Reviewed: Allergy & Precautions, NPO status , Patient's Chart, lab work & pertinent test results  Airway Mallampati: I  TM Distance: >3 FB Neck ROM: Full    Dental  (+) Edentulous Upper, Edentulous Lower   Pulmonary sleep apnea , COPD, Current Smoker and Patient abstained from smoking.   Pulmonary exam normal breath sounds clear to auscultation       Cardiovascular hypertension, Pt. on medications + CAD  Normal cardiovascular exam Rhythm:Regular Rate:Normal     Neuro/Psych   Anxiety Depression Bipolar Disorder Schizophrenia     GI/Hepatic pancreatitis   Endo/Other  diabetes, Type 2    Renal/GU Renal InsufficiencyRenal disease     Musculoskeletal   Abdominal   Peds  Hematology   Anesthesia Other Findings All: Desipramine  Reproductive/Obstetrics                             Anesthesia Physical Anesthesia Plan  ASA: 3  Anesthesia Plan: General   Post-op Pain Management:    Induction: Intravenous  PONV Risk Score and Plan: 1 and Treatment may vary due to age or medical condition and Ondansetron  Airway Management Planned: Oral ETT  Additional Equipment: None  Intra-op Plan:   Post-operative Plan: Extubation in OR  Informed Consent: I have reviewed the patients History and Physical, chart, labs and discussed the procedure including the risks, benefits and alternatives for the proposed anesthesia with the patient or authorized representative who has indicated his/her understanding and acceptance.     Dental advisory given  Plan Discussed with: CRNA  Anesthesia Plan Comments:         Anesthesia Quick Evaluation

## 2023-09-23 NOTE — Transfer of Care (Signed)
 Immediate Anesthesia Transfer of Care Note  Patient: Steve Romero  Procedure(s) Performed: ENDOSCOPIC RETROGRADE CHOLANGIOPANCREATOGRAPHY (ERCP) WITH PROPOFOL BRUSH BIOPSY, BILE DUCT INSERTION, STENT, BILE DUCT  Patient Location: PACU  Anesthesia Type:General  Level of Consciousness: awake and drowsy  Airway & Oxygen Therapy: Patient Spontanous Breathing and Patient connected to face mask oxygen  Post-op Assessment: Report given to RN and Post -op Vital signs reviewed and stable  Post vital signs: Reviewed and stable  Last Vitals:  Vitals Value Taken Time  BP    Temp    Pulse 65 09/23/23 1531  Resp 16 09/23/23 1531  SpO2 100 % 09/23/23 1531  Vitals shown include unfiled device data.  Last Pain:  Vitals:   09/23/23 1216  TempSrc: Temporal  PainSc: 0-No pain         Complications: No notable events documented.

## 2023-09-23 NOTE — Progress Notes (Signed)
 Brief GI progress note.  Patient was seen in postprocedural recovery area.  He was starting to experience 8-9 out of 10 pain in the right upper quadrant region.  As a result of this discomfort, we administered IV fentanyl.  We also administered IV fluids for concern of potential postprocedure pancreatitis.  As ERCP procedure note suggests, this was a relatively straightforward case without the pancreas being touched or injected, but he is at risk of pancreatitis anytime we do an ERCP.  KUB/chest x-ray were performed, my wet read of them did not suggest overt perforation.  Will discuss with radiology.  He will continue to receive IV fentanyl, but if he continues to have issues he will need to be brought in for observation.  For his nausea, Zofran was administered and then Barhemsys was administered for postanesthesia nausea/retching.  The patient's family has been updated.  Patient agrees with coming into the hospital if needed, but would prefer going home if possible.   Steve Parish, MD [facility] Office # [phone]

## 2023-09-23 NOTE — H&P (Addendum)
 GASTROENTEROLOGY PROCEDURE H&P NOTE   Primary Care Physician: Raliegh Ip, DO  HPI: Steve Romero is a 53 y.o. male who presents for ERCP for progressive biliary dilation and LFT abnormalities with known prior inflammatory stricture (previously years ago biliary stent migrated proximally and required [facility] removal).  Past Medical History:  Diagnosis Date   Acute pancreatitis without necrosis or infection, unspecified    AKI (acute kidney injury) (HCC) 05/17/2022   Anxiety 10/2014   Ascites    Bipolar disorder [facility]) age 60   COPD (chronic obstructive pulmonary disease) (HCC) 2016   Depression age 6   Enlarged prostate    Hyperlipidemia 2016   Hypertension 2013   Nausea vomiting and diarrhea 06/15/2022   Schizoaffective disorder (HCC) 11/13/2014   Sleep apnea    Does not wear c-pap, sleeps in sitting up position per pt   Thyroid disease 11/2014   Past Surgical History:  Procedure Laterality Date   BALLOON DILATION N/A 11/07/2020   Procedure: BALLOON DILATION;  Surgeon: Lemar Lofty., MD;  Location: [facility] ENDOSCOPY;  Service: Gastroenterology;  Laterality: N/A;   BALLOON DILATION N/A 06/26/2021   Procedure: BALLOON DILATION;  Surgeon: Meridee Score Netty Starring., MD;  Location: [facility] ENDOSCOPY;  Service: Gastroenterology;  Laterality: N/A;   BILIARY DILATION  07/24/2021   Procedure: BILIARY DILATION;  Surgeon: Meridee Score Netty Starring., MD;  Location: [facility] ENDOSCOPY;  Service: Gastroenterology;;   BILIARY DILATION  05/22/2022   Procedure: BILIARY DILATION;  Surgeon: Lemar Lofty., MD;  Location: [facility] ENDOSCOPY;  Service: Gastroenterology;;   BILIARY DILATION  06/16/2022   Procedure: GASTROSTOMY DILATION;  Surgeon: Lemar Lofty., MD;  Location: [facility] ENDOSCOPY;  Service: Gastroenterology;;   BILIARY STENT PLACEMENT N/A 10/10/2020   Procedure: BILIARY STENT PLACEMENT;  Surgeon: Malissa Hippo, MD;  Location: [facility];  Service: Endoscopy;   Laterality: N/A;   BILIARY STENT PLACEMENT  11/09/2020   Procedure: BILIARY STENT PLACEMENT;  Surgeon: Meridee Score Netty Starring., MD;  Location: [facility] ENDOSCOPY;  Service: Gastroenterology;;   BILIARY STENT PLACEMENT  11/07/2020   Procedure: BILIARY STENT PLACEMENT;  Surgeon: Lemar Lofty., MD;  Location: [facility] ENDOSCOPY;  Service: Gastroenterology;;   BILIARY STENT PLACEMENT  11/11/2020   Procedure: BILIARY STENT PLACEMENT;  Surgeon: Lemar Lofty., MD;  Location: [facility] ENDOSCOPY;  Service: Gastroenterology;;   BILIARY STENT PLACEMENT  11/14/2020   Procedure: BILIARY STENT PLACEMENT;  Surgeon: Lemar Lofty., MD;  Location: [facility] ENDOSCOPY;  Service: Gastroenterology;;   BILIARY STENT PLACEMENT N/A 01/29/2021   Procedure: BILIARY STENT PLACEMENT;  Surgeon: Lemar Lofty., MD;  Location: [facility] ENDOSCOPY;  Service: Gastroenterology;  Laterality: N/A;   BILIARY STENT PLACEMENT  06/26/2021   Procedure: BILIARY STENT PLACEMENT;  Surgeon: Meridee Score Netty Starring., MD;  Location: [facility] ENDOSCOPY;  Service: Gastroenterology;;   BILIARY STENT PLACEMENT  07/24/2021   Procedure: BILIARY STENT PLACEMENT;  Surgeon: Lemar Lofty., MD;  Location: [facility] ENDOSCOPY;  Service: Gastroenterology;;   BIOPSY  11/07/2020   Procedure: BIOPSY;  Surgeon: Lemar Lofty., MD;  Location: [facility] ENDOSCOPY;  Service: Gastroenterology;;   BIOPSY  12/20/2020   Procedure: BIOPSY;  Surgeon: Lemar Lofty., MD;  Location: [facility] ENDOSCOPY;  Service: Gastroenterology;;   BIOPSY  06/26/2021   Procedure: BIOPSY;  Surgeon: Lemar Lofty., MD;  Location: [facility] ENDOSCOPY;  Service: Gastroenterology;;   CARPAL TUNNEL RELEASE Left 02/21/2016   Procedure: CARPAL TUNNEL RELEASE;  Surgeon: Vickki Hearing, MD;  Location: [facility];  Service: Orthopedics;  Laterality: Left;  CHOLECYSTECTOMY N/A 10/09/2020   Procedure: LAPAROSCOPIC CHOLECYSTECTOMY;  Surgeon: Lucretia Roers, MD;  Location: [facility];   Service: General;  Laterality: N/A;   CYST ENTEROSTOMY N/A 11/07/2020   Procedure: CYST ENTEROSTOMY;  Surgeon: Lemar Lofty., MD;  Location: [facility] ENDOSCOPY;  Service: Gastroenterology;  Laterality: N/A;   CYST GASTROSTOMY  11/11/2020   Procedure: CYST NECROSECTOMY;  Surgeon: Meridee Score Netty Starring., MD;  Location: [facility] ENDOSCOPY;  Service: Gastroenterology;;   CYST GASTROSTOMY  11/14/2020   Procedure: CYST NECROSECTOMY;  Surgeon: Lemar Lofty., MD;  Location: [facility] ENDOSCOPY;  Service: Gastroenterology;;   CYST GASTROSTOMY  06/16/2022   Procedure: CYST GASTROSTOMY;  Surgeon: Lemar Lofty., MD;  Location: [facility] ENDOSCOPY;  Service: Gastroenterology;;   CYST REMOVAL HAND     CYSTOSCOPY  06/26/2021   Procedure: CYSTOGASTROSTOMY;  Surgeon: Mansouraty, Netty Starring., MD;  Location: [facility] ENDOSCOPY;  Service: Gastroenterology;;   ENDOSCOPIC RETROGRADE CHOLANGIOPANCREATOGRAPHY (ERCP) WITH PROPOFOL N/A 11/09/2020   Procedure: ENDOSCOPIC RETROGRADE CHOLANGIOPANCREATOGRAPHY (ERCP) WITH PROPOFOL;  Surgeon: Lemar Lofty., MD;  Location: [facility] ENDOSCOPY;  Service: Gastroenterology;  Laterality: N/A;   ENDOSCOPIC RETROGRADE CHOLANGIOPANCREATOGRAPHY (ERCP) WITH PROPOFOL N/A 12/20/2020   Procedure: ENDOSCOPIC RETROGRADE CHOLANGIOPANCREATOGRAPHY (ERCP) WITH PROPOFOL;  Surgeon: Meridee Score Netty Starring., MD;  Location: [facility] ENDOSCOPY;  Service: Gastroenterology;  Laterality: N/A;   ENDOSCOPIC RETROGRADE CHOLANGIOPANCREATOGRAPHY (ERCP) WITH PROPOFOL N/A 01/29/2021   Procedure: ENDOSCOPIC RETROGRADE CHOLANGIOPANCREATOGRAPHY (ERCP) WITH PROPOFOL;  Surgeon: Meridee Score Netty Starring., MD;  Location: [facility] ENDOSCOPY;  Service: Gastroenterology;  Laterality: N/A;   ENDOSCOPIC RETROGRADE CHOLANGIOPANCREATOGRAPHY (ERCP) WITH PROPOFOL N/A 07/24/2021   Procedure: ENDOSCOPIC RETROGRADE CHOLANGIOPANCREATOGRAPHY (ERCP) WITH PROPOFOL;  Surgeon: Meridee Score Netty Starring., MD;  Location: [facility] ENDOSCOPY;  Service:  Gastroenterology;  Laterality: N/A;   ERCP N/A 10/10/2020   Procedure: ENDOSCOPIC RETROGRADE CHOLANGIOPANCREATOGRAPHY (ERCP);  Surgeon: Malissa Hippo, MD;  Location: [facility];  Service: Endoscopy;  Laterality: N/A;   ERCP     ERCP N/A 05/22/2022   Procedure: ENDOSCOPIC RETROGRADE CHOLANGIOPANCREATOGRAPHY (ERCP);  Surgeon: Lemar Lofty., MD;  Location: [facility] ENDOSCOPY;  Service: Gastroenterology;  Laterality: N/A;   ESOPHAGOGASTRODUODENOSCOPY N/A 11/11/2020   Procedure: ESOPHAGOGASTRODUODENOSCOPY (EGD);  Surgeon: Lemar Lofty., MD;  Location: [facility] ENDOSCOPY;  Service: Gastroenterology;  Laterality: N/A;   ESOPHAGOGASTRODUODENOSCOPY N/A 11/14/2020   Procedure: ESOPHAGOGASTRODUODENOSCOPY (EGD);  Surgeon: Lemar Lofty., MD;  Location: [facility] ENDOSCOPY;  Service: Gastroenterology;  Laterality: N/A;   ESOPHAGOGASTRODUODENOSCOPY (EGD) WITH PROPOFOL N/A 11/09/2020   Procedure: ESOPHAGOGASTRODUODENOSCOPY (EGD) WITH PROPOFOL;  Surgeon: Meridee Score Netty Starring., MD;  Location: [facility] ENDOSCOPY;  Service: Gastroenterology;  Laterality: N/A;   ESOPHAGOGASTRODUODENOSCOPY (EGD) WITH PROPOFOL N/A 11/07/2020   Procedure: ESOPHAGOGASTRODUODENOSCOPY (EGD) WITH PROPOFOL;  Surgeon: Meridee Score Netty Starring., MD;  Location: [facility] ENDOSCOPY;  Service: Gastroenterology;  Laterality: N/A;   ESOPHAGOGASTRODUODENOSCOPY (EGD) WITH PROPOFOL N/A 12/20/2020   Procedure: ESOPHAGOGASTRODUODENOSCOPY (EGD) WITH PROPOFOL;  Surgeon: Meridee Score Netty Starring., MD;  Location: [facility] ENDOSCOPY;  Service: Gastroenterology;  Laterality: N/A;   ESOPHAGOGASTRODUODENOSCOPY (EGD) WITH PROPOFOL N/A 06/26/2021   Procedure: ESOPHAGOGASTRODUODENOSCOPY (EGD) WITH PROPOFOL;  Surgeon: Meridee Score Netty Starring., MD;  Location: [facility] ENDOSCOPY;  Service: Gastroenterology;  Laterality: N/A;   ESOPHAGOGASTRODUODENOSCOPY (EGD) WITH PROPOFOL N/A 07/24/2021   Procedure: ESOPHAGOGASTRODUODENOSCOPY (EGD) WITH PROPOFOL;  Surgeon: Meridee Score Netty Starring., MD;   Location: [facility] ENDOSCOPY;  Service: Gastroenterology;  Laterality: N/A;  AXIOS STENT   ESOPHAGOGASTRODUODENOSCOPY (EGD) WITH PROPOFOL N/A 05/22/2022   Procedure: ESOPHAGOGASTRODUODENOSCOPY (EGD) WITH PROPOFOL;  Surgeon: Meridee Score Netty Starring., MD;  Location: [facility] ENDOSCOPY;  Service: Gastroenterology;  Laterality: N/A;   ESOPHAGOGASTRODUODENOSCOPY (  EGD) WITH PROPOFOL N/A 06/16/2022   Procedure: ESOPHAGOGASTRODUODENOSCOPY (EGD) WITH PROPOFOL;  Surgeon: Meridee Score Netty Starring., MD;  Location: [facility] ENDOSCOPY;  Service: Gastroenterology;  Laterality: N/A;   ESOPHAGOGASTRODUODENOSCOPY (EGD) WITH PROPOFOL N/A 07/30/2022   Procedure: ESOPHAGOGASTRODUODENOSCOPY (EGD) WITH PROPOFOL;  Surgeon: Meridee Score Netty Starring., MD;  Location: [facility] ENDOSCOPY;  Service: Gastroenterology;  Laterality: N/A;   ESOPHAGOGASTRODUODENOSCOPY (EGD) WITH PROPOFOL N/A 03/30/2023   Procedure: ESOPHAGOGASTRODUODENOSCOPY (EGD) WITH PROPOFOL;  Surgeon: Iva Boop, MD;  Location: [facility] ENDOSCOPY;  Service: Gastroenterology;  Laterality: N/A;   EUS  11/14/2020   Procedure: UPPER ENDOSCOPIC ULTRASOUND (EUS) LINEAR;  Surgeon: Lemar Lofty., MD;  Location: [facility] ENDOSCOPY;  Service: Gastroenterology;;   EUS N/A 06/26/2021   Procedure: UPPER ENDOSCOPIC ULTRASOUND (EUS) RADIAL;  Surgeon: Lemar Lofty., MD;  Location: [facility] ENDOSCOPY;  Service: Gastroenterology;  Laterality: N/A;   EUS N/A 05/22/2022   Procedure: UPPER ENDOSCOPIC ULTRASOUND (EUS) LINEAR;  Surgeon: Lemar Lofty., MD;  Location: [facility] ENDOSCOPY;  Service: Gastroenterology;  Laterality: N/A;   EUS N/A 06/16/2022   Procedure: UPPER ENDOSCOPIC ULTRASOUND (EUS) LINEAR;  Surgeon: Lemar Lofty., MD;  Location: [facility] ENDOSCOPY;  Service: Gastroenterology;  Laterality: N/A;   FINE NEEDLE ASPIRATION  05/22/2022   Procedure: FINE NEEDLE ASPIRATION;  Surgeon: Meridee Score Netty Starring., MD;  Location: [facility] ENDOSCOPY;  Service: Gastroenterology;;   GASTROINTESTINAL  STENT REMOVAL  12/20/2020   Procedure: GASTROINTESTINAL STENT REMOVAL;  Surgeon: Lemar Lofty., MD;  Location: [facility] ENDOSCOPY;  Service: Gastroenterology;;  cyst gastrostomy stent and double pig tail stents x2 removed   HERNIA REPAIR Left 2002   groin   INGUINAL HERNIA REPAIR Left 04/05/2017   Procedure: RECURRENT HERNIA REPAIR INGUINAL ADULT WITH MESH;  Surgeon: Franky Macho, MD;  Location: [facility];  Service: General;  Laterality: Left;   MASS EXCISION Right 04/29/2020   Procedure: EXCISION MASS RIGHT WRIST;  Surgeon: Betha Loa, MD;  Location: [facility];  Service: Orthopedics;  Laterality: Right;   MASS EXCISION Right 10/09/2020   Procedure: EXCISION MASS, ABDOMINAL WALL, 2CM;  Surgeon: Lucretia Roers, MD;  Location: [facility];  Service: General;  Laterality: Right;   PANCREATIC STENT PLACEMENT  06/26/2021   Procedure: AXIOS STENT PLACEMENT;  Surgeon: Lemar Lofty., MD;  Location: [facility] ENDOSCOPY;  Service: Gastroenterology;;   PANCREATIC STENT PLACEMENT  06/16/2022   Procedure: PANCREATIC STENT PLACEMENT;  Surgeon: Lemar Lofty., MD;  Location: [facility] ENDOSCOPY;  Service: Gastroenterology;;   REMOVAL OF STONES  12/20/2020   Procedure: REMOVAL OF STONES;  Surgeon: Lemar Lofty., MD;  Location: [facility] ENDOSCOPY;  Service: Gastroenterology;;   REMOVAL OF STONES  07/24/2021   Procedure: REMOVAL OF STONES;  Surgeon: Lemar Lofty., MD;  Location: [facility] ENDOSCOPY;  Service: Gastroenterology;;   REMOVAL OF STONES  05/22/2022   Procedure: REMOVAL OF STONES;  Surgeon: Lemar Lofty., MD;  Location: [facility] ENDOSCOPY;  Service: Gastroenterology;;   Dennison Mascot N/A 10/10/2020   Procedure: Dennison Mascot;  Surgeon: Malissa Hippo, MD;  Location: [facility];  Service: Endoscopy;  Laterality: N/A;   SPHINCTEROTOMY  07/24/2021   Procedure: SPHINCTEROTOMY;  Surgeon: Mansouraty, Netty Starring., MD;  Location: [facility] ENDOSCOPY;  Service: Gastroenterology;;    SPLENECTOMY, TOTAL N/A 11/17/2022   Procedure: SPLENECTOMY;  Surgeon: Fritzi Mandes, MD;  Location: [facility] OR;  Service: General;  Laterality: N/A;   SPYGLASS CHOLANGIOSCOPY N/A 12/20/2020   Procedure: [id number] CHOLANGIOSCOPY;  Surgeon: Lemar Lofty., MD;  Location: [facility] ENDOSCOPY;  Service: Gastroenterology;  Laterality: N/A;  SPYGLASS CHOLANGIOSCOPY N/A 07/24/2021   Procedure: SPYGLASS CHOLANGIOSCOPY;  Surgeon: Lemar Lofty., MD;  Location: [facility] ENDOSCOPY;  Service: Gastroenterology;  Laterality: N/A;   STENT REMOVAL  11/09/2020   Procedure: STENT REMOVAL;  Surgeon: Lemar Lofty., MD;  Location: [facility] ENDOSCOPY;  Service: Gastroenterology;;   Francine Graven REMOVAL  11/11/2020   Procedure: STENT REMOVAL;  Surgeon: Lemar Lofty., MD;  Location: [facility] ENDOSCOPY;  Service: Gastroenterology;;   Francine Graven REMOVAL  11/14/2020   Procedure: STENT REMOVAL;  Surgeon: Lemar Lofty., MD;  Location: [facility] ENDOSCOPY;  Service: Gastroenterology;;   Francine Graven REMOVAL  12/20/2020   Procedure: STENT REMOVAL;  Surgeon: Lemar Lofty., MD;  Location: [facility] ENDOSCOPY;  Service: Gastroenterology;;  biliary x2   STENT REMOVAL  07/24/2021   Procedure: AXIOS STENT REMOVAL;  Surgeon: Lemar Lofty., MD;  Location: [facility] ENDOSCOPY;  Service: Gastroenterology;;   Francine Graven REMOVAL  07/30/2022   Procedure: STENT REMOVAL;  Surgeon: Lemar Lofty., MD;  Location: [facility] ENDOSCOPY;  Service: Gastroenterology;;   UPPER ESOPHAGEAL ENDOSCOPIC ULTRASOUND (EUS) N/A 11/07/2020   Procedure: UPPER ESOPHAGEAL ENDOSCOPIC ULTRASOUND (EUS);  Surgeon: Lemar Lofty., MD;  Location: [facility] ENDOSCOPY;  Service: Gastroenterology;  Laterality: N/A;   UPPER GASTROINTESTINAL ENDOSCOPY     WOUND DEBRIDEMENT  11/09/2020   Procedure: CYST NECROSECTOMY;  Surgeon: Meridee Score, Netty Starring., MD;  Location: [facility] ENDOSCOPY;  Service: Gastroenterology;;   Current Facility-Administered Medications  Medication Dose  Route Frequency Provider Last Rate Last Admin   0.9 %  sodium chloride infusion   Intravenous Continuous Mansouraty, Netty Starring., MD        Current Facility-Administered Medications:    0.9 %  sodium chloride infusion, , Intravenous, Continuous, Mansouraty, Netty Starring., MD Allergies  Allergen Reactions   Desipramine Nausea Only and Rash   Family History  Problem Relation Age of Onset   Diabetes Father    Heart disease Father    Heart disease Maternal Grandmother    Stroke Maternal Grandfather    Colon cancer Neg Hx    Esophageal cancer Neg Hx    Inflammatory bowel disease Neg Hx    Liver disease Neg Hx    Pancreatic cancer Neg Hx    Rectal cancer Neg Hx    Stomach cancer Neg Hx    Social History   Socioeconomic History   Marital status: Single    Spouse name: Not on file   Number of children: 0   Years of education: Not on file   Highest education level: GED or equivalent  Occupational History   Not on file  Tobacco Use   Smoking status: Every Day    Current packs/day: 0.25    Average packs/day: 0.3 packs/day for 26.0 years (6.5 ttl pk-yrs)    Types: Cigarettes   Smokeless tobacco: Never   Tobacco comments:    4-5 cig daily as of 10/07/2020.  Vaping Use   Vaping status: Never Used  Substance and Sexual Activity   Alcohol use: No   Drug use: No   Sexual activity: Never    Birth control/protection: None  Other Topics Concern   Not on file  Social History Narrative   Not on file   Social Drivers of Health   Financial Resource Strain: Low Risk  (07/27/2023)   Overall Financial Resource Strain (CARDIA)    Difficulty of Paying Living Expenses: Not very hard  Food Insecurity: No Food Insecurity (08/16/2023)   Hunger Vital Sign    Worried About Running Out of Food in  the Last Year: Never true    Ran Out of Food in the Last Year: Never true  Recent Concern: Food Insecurity - Food Insecurity Present (07/27/2023)   Hunger Vital Sign    Worried About Running Out of  Food in the Last Year: Never true    Ran Out of Food in the Last Year: Sometimes true  Transportation Needs: No Transportation Needs (08/16/2023)   PRAPARE - Administrator, Civil Service (Medical): No    Lack of Transportation (Non-Medical): No  Physical Activity: Insufficiently Active (07/27/2023)   Exercise Vital Sign    Days of Exercise per Week: 2 days    Minutes of Exercise per Session: 10 min  Stress: No Stress Concern Present (07/27/2023)   [employer] of Occupational Health - Occupational Stress Questionnaire    Feeling of Stress : Only a little  Social Connections: Unknown (07/27/2023)   Social Connection and Isolation Panel [NHANES]    Frequency of Communication with Friends and Family: Three times a week    Frequency of Social Gatherings with Friends and Family: Once a week    Attends Religious Services: Patient declined    Database administrator or Organizations: No    Attends Engineer, structural: Not on file    Marital Status: Divorced  Intimate Partner Violence: Not At Risk (08/16/2023)   Humiliation, Afraid, Rape, and Kick questionnaire    Fear of Current or Ex-Partner: No    Emotionally Abused: No    Physically Abused: No    Sexually Abused: No    Physical Exam: Today's Vitals   09/23/23 1216  BP: (!) 139/91  Pulse: (!) 57  Resp: 19  Temp: (!) 97.4 F (36.3 C)  TempSrc: Temporal  SpO2: 100%  Weight: 77.1 kg  Height: 5\' 10"  (1.778 m)  PainSc: 0-No pain   Body mass index is 24.39 kg/m. GEN: NAD EYE: Sclerae anicteric ENT: MMM CV: Non-tachycardic GI: Soft, 4-5/10 Left flank discomfort preprocedure NEURO:  Alert & Oriented x 3  Lab Results: No results for input(s): "WBC", "HGB", "HCT", "PLT" in the last 72 hours. BMET No results for input(s): "NA", "K", "CL", "CO2", "GLUCOSE", "BUN", "CREATININE", "CALCIUM" in the last 72 hours. LFT No results for input(s): "PROT", "ALBUMIN", "AST", "ALT", "ALKPHOS", "BILITOT", "BILIDIR",  "IBILI" in the last 72 hours. PT/INR No results for input(s): "LABPROT", "INR" in the last 72 hours.   Impression / Plan: This is a 53 y.o.male who presents for ERCP for progressive biliary dilation and LFT abnormalities with known prior inflammatory stricture (previously years ago biliary stent migrated proximally and required [facility] removal).  The risks of an ERCP were discussed at length, including but not limited to the risk of perforation, bleeding, abdominal pain, post-ERCP pancreatitis (while usually mild can be severe and even life threatening).   The risks and benefits of endoscopic evaluation/treatment were discussed with the patient and/or family; these include but are not limited to the risk of perforation, infection, bleeding, missed lesions, lack of diagnosis, severe illness requiring hospitalization, as well as anesthesia and sedation related illnesses.  The patient's history has been reviewed, patient examined, no change in status, and deemed stable for procedure.  The patient and/or family is agreeable to proceed.    Corliss Parish, MD [facility] Advanced Endoscopy Office # [phone]

## 2023-09-24 ENCOUNTER — Telehealth: Payer: Self-pay

## 2023-09-24 ENCOUNTER — Encounter (HOSPITAL_COMMUNITY): Payer: Self-pay | Admitting: Gastroenterology

## 2023-09-24 DIAGNOSIS — R109 Unspecified abdominal pain: Secondary | ICD-10-CM | POA: Diagnosis present

## 2023-09-24 MED ORDER — ACETAMINOPHEN 500 MG PO TABS: 1000.0000 mg | ORAL_TABLET | Freq: Three times a day (TID) | ORAL | Status: AC | PRN

## 2023-09-24 NOTE — Telephone Encounter (Signed)
 Mansouraty, Netty Starring., MD  Loretha Stapler, RN Daquarius Dubeau, Place ERCP recall 4-6 months from now. Thanks. GM

## 2023-09-24 NOTE — Anesthesia Postprocedure Evaluation (Signed)
 Anesthesia Post Note  Patient: Steve Romero  Procedure(s) Performed: ENDOSCOPIC RETROGRADE CHOLANGIOPANCREATOGRAPHY (ERCP) WITH PROPOFOL BRUSH BIOPSY, BILE DUCT INSERTION, STENT, BILE DUCT     Patient location during evaluation: PACU Anesthesia Type: General Level of consciousness: awake and alert Pain management: pain level controlled Vital Signs Assessment: post-procedure vital signs reviewed and stable Respiratory status: spontaneous breathing, nonlabored ventilation, respiratory function stable and patient connected to nasal cannula oxygen Cardiovascular status: blood pressure returned to baseline and stable Postop Assessment: no apparent nausea or vomiting Anesthetic complications: no   No notable events documented.  Last Vitals:  Vitals:   09/23/23 1722 09/23/23 1740  BP: (!) 185/94   Pulse: 63 (!) 55  Resp: 12 12  Temp:    SpO2: 96% 98%    Last Pain:  Vitals:   09/23/23 1740  TempSrc:   PainSc: 3    Pain Goal:                   Lowella Curb

## 2023-09-24 NOTE — Telephone Encounter (Signed)
 Attempted to reach the pt x2. Left messages to return call. It looks like pt is admitted still. Dr Otilio Carpen

## 2023-09-24 NOTE — Telephone Encounter (Signed)
 Patient never admitted. This was an IT issue. He is likely at his father's funeral today. He has precautions and knows how to come to the ED if needed. GM

## 2023-09-24 NOTE — Telephone Encounter (Signed)
 The pt recall for ERCP has been entered   Left message on machine to call back

## 2023-09-24 NOTE — Telephone Encounter (Signed)
-----   Message from [facility] sent at 09/23/2023  5:53 PM EDT ----- Regarding: Followup on Friday Steve Romero, Can you followup with patient on Friday? He has baseline pain but had more pain post-ERCP with stent placement. I think likely from his new stent expanding. If he is doing very unwell, he will need to go to hospital after the funeral he is going to tomorrow. He and sister understand and agreed with plan to head home on Thursday evening. Thanks. GM

## 2023-09-24 NOTE — Progress Notes (Signed)
 Pt discharged on 09/23/2023 at 1810 from endoscopy unit. Not admitted for observation

## 2023-09-25 NOTE — Discharge Summary (Signed)
 This patient was never placed into the hospital under observation or admission. This discharge notation is only placed to close an encounter so the patient may be "fully discharged" from our endoscopy unit on 3/20 in the afternoon after his ERCP.

## 2023-09-27 ENCOUNTER — Encounter: Payer: Self-pay | Admitting: Family Medicine

## 2023-09-27 ENCOUNTER — Ambulatory Visit: Payer: MEDICAID | Admitting: Family Medicine

## 2023-09-27 ENCOUNTER — Other Ambulatory Visit (HOSPITAL_COMMUNITY): Payer: Self-pay

## 2023-09-27 ENCOUNTER — Telehealth: Payer: Self-pay

## 2023-09-27 VITALS — BP 109/99 | HR 66 | Temp 98.8°F | Ht 70.0 in | Wt 165.4 lb

## 2023-09-27 DIAGNOSIS — E119 Type 2 diabetes mellitus without complications: Secondary | ICD-10-CM

## 2023-09-27 DIAGNOSIS — R52 Pain, unspecified: Secondary | ICD-10-CM | POA: Diagnosis not present

## 2023-09-27 DIAGNOSIS — K861 Other chronic pancreatitis: Secondary | ICD-10-CM | POA: Diagnosis not present

## 2023-09-27 DIAGNOSIS — Z794 Long term (current) use of insulin: Secondary | ICD-10-CM | POA: Diagnosis not present

## 2023-09-27 LAB — CYTOLOGY - NON PAP

## 2023-09-27 MED ORDER — OXYCODONE HCL 10 MG PO TABS: 10.0000 mg | ORAL_TABLET | Freq: Four times a day (QID) | ORAL | 0 refills | Status: DC | PRN

## 2023-09-27 MED ORDER — SEMGLEE (YFGN) 100 UNIT/ML ~~LOC~~ SOPN: 50.0000 [IU] | PEN_INJECTOR | Freq: Two times a day (BID) | SUBCUTANEOUS | Status: DC

## 2023-09-27 NOTE — Telephone Encounter (Signed)
 Pharmacy Patient Advocate Encounter   Received notification from Onbase that prior authorization for FreeStyle Libre 3 Plus Sensor is required/requested.   Insurance verification completed.   The patient is insured through [employer] .   Per test claim: PA required; PA submitted to above mentioned insurance via CoverMyMeds Key/confirmation #/EOC [id number] Status is pending

## 2023-09-27 NOTE — Progress Notes (Signed)
 Subjective: CC: Follow-up chronic pancreatitis PCP: Raliegh Ip, DO WUJ:[id number] R Beed is a 53 y.o. male presenting to clinic today for:  1.  Chronic biliary pancreatitis Patient is under the care of Dr. Meridee Score.  He was recently seen on September 23, 2023 for ERCP.  He was given pain medication following that procedure, his specialist did reach out to me to [facility] me on this.  Patient notes that he has persistent generalized abdominal pain.  He approximately 2 days/week he is experiencing pain that requires his oxycodone which he takes roughly every 6 hours as needed.  He reports constipation is managed by Colace.  He has not picked up the Narcan that was prescribed but will pick this up today.  Overall this keeps his pain spikes down to a 3 out of 10.  He only utilizes the medication if he is a 6 out of 10 or above.  He is still uncertain as to what they are doing about the cysts along his pancreas.  He is scheduled for repeat imaging in a couple of months.  Reports no excessive daytime sedation, falls or respiratory depression with the oxycodone   ROS: Per HPI  Allergies  Allergen Reactions   Desipramine Nausea Only and Rash   Past Medical History:  Diagnosis Date   Acute pancreatitis without necrosis or infection, unspecified    AKI (acute kidney injury) (HCC) 05/17/2022   Anxiety 10/2014   Ascites    Bipolar disorder (HCC) age 59   COPD (chronic obstructive pulmonary disease) (HCC) 2016   Depression age 17   Enlarged prostate    Hyperlipidemia 2016   Hypertension 2013   Nausea vomiting and diarrhea 06/15/2022   Schizoaffective disorder (HCC) 11/13/2014   Sleep apnea    Does not wear c-pap, sleeps in sitting up position per pt   Thyroid disease 11/2014    Current Outpatient Medications:    acetaminophen (TYLENOL) 500 MG tablet, Take 2 tablets (1,000 mg total) by mouth every 8 (eight) hours as needed for mild pain (pain score 1-3)., Disp: , Rfl:    albuterol (VENTOLIN  HFA) 108 (90 Base) MCG/ACT inhaler, Inhale 2 puffs into the lungs every 6 (six) hours as needed for wheezing or shortness of breath., Disp: 8 g, Rfl: 0   aspirin EC 81 MG tablet, Take 81-162 mg by mouth daily as needed (for pain). Swallow whole., Disp: , Rfl:    Continuous Blood Gluc Receiver (FREESTYLE LIBRE READER) DEVI, 1 Units by Does not apply route daily. UAD to test BGs daily. Dx E11.9, Disp: 1 each, Rfl: 1   Continuous Glucose Sensor (FREESTYLE LIBRE 3 PLUS SENSOR) MISC, Check BGs continuously. E11.9 Change sensor every 15 days., Disp: 6 each, Rfl: 3   esomeprazole (NEXIUM) 20 MG capsule, Take 1 capsule (20 mg total) by mouth daily., Disp: 30 capsule, Rfl: 1   Glucagon (BAQSIMI ONE PACK) 3 MG/DOSE POWD, Place 1 Device into the nose as needed (Low blood sugar with impaired consciousness)., Disp: 2 each, Rfl: 3   Insulin Pen Needle (BD PEN NEEDLE NANO 2ND GEN) 32G X 4 MM MISC, UAD to administer insulin Dx E11.10, Disp: 100 each, Rfl: 3   lipase/protease/amylase (CREON) 36000 UNITS CPEP capsule, Take 3 capsules with meals and 1-2  capsule with snacks ( up too 2 snacks ) (Patient taking differently: Take 36,000-108,000 Units by mouth See admin instructions. Take 108,000-144,000 units by mouth with each meal eaten and 72,000 units with each snack), Disp: 570 capsule,  Rfl: 2   naloxone (NARCAN) nasal spray 4 mg/0.1 mL, Place 1 spray into the nose as directed., Disp: , Rfl:    NOVOLOG RELION 100 UNIT/ML injection, Inject 5-15 Units into the skin See admin instructions. Inject 15 units into the skin three times a day with meals and 5-10 units as needed with snacks, depending on BGL reading, Disp: , Rfl:    ondansetron (ZOFRAN) 4 MG tablet, Take 4 mg by mouth every 6 (six) hours as needed for nausea or vomiting., Disp: , Rfl:    Oxycodone HCl 10 MG TABS, Take 1 tablet (10 mg total) by mouth every 6 (six) hours as needed. (Patient taking differently: Take 10 mg by mouth every 6 (six) hours as needed (for  pain).), Disp: 30 tablet, Rfl: 0   rosuvastatin (CRESTOR) 10 MG tablet, Take 1 tablet (10 mg total) by mouth at bedtime. If you experience muscle aches take every other day, Disp: 90 tablet, Rfl: 1   SEMGLEE, YFGN, 100 UNIT/ML Pen, Inject 40 Units into the skin 2 (two) times daily., Disp: 24 mL, Rfl: 11 Social History   Socioeconomic History   Marital status: Single    Spouse name: Not on file   Number of children: 0   Years of education: Not on file   Highest education level: GED or equivalent  Occupational History   Not on file  Tobacco Use   Smoking status: Every Day    Current packs/day: 0.25    Average packs/day: 0.3 packs/day for 26.0 years (6.5 ttl pk-yrs)    Types: Cigarettes   Smokeless tobacco: Never   Tobacco comments:    4-5 cig daily as of 10/07/2020.  Vaping Use   Vaping status: Never Used  Substance and Sexual Activity   Alcohol use: No   Drug use: No   Sexual activity: Never    Birth control/protection: None  Other Topics Concern   Not on file  Social History Narrative   Not on file   Social Drivers of Health   Financial Resource Strain: Low Risk  (07/27/2023)   Overall Financial Resource Strain (CARDIA)    Difficulty of Paying Living Expenses: Not very hard  Food Insecurity: No Food Insecurity (08/16/2023)   Hunger Vital Sign    Worried About Running Out of Food in the Last Year: Never true    Ran Out of Food in the Last Year: Never true  Recent Concern: Food Insecurity - Food Insecurity Present (07/27/2023)   Hunger Vital Sign    Worried About Running Out of Food in the Last Year: Never true    Ran Out of Food in the Last Year: Sometimes true  Transportation Needs: No Transportation Needs (08/16/2023)   PRAPARE - Administrator, Civil Service (Medical): No    Lack of Transportation (Non-Medical): No  Physical Activity: Insufficiently Active (07/27/2023)   Exercise Vital Sign    Days of Exercise per Week: 2 days    Minutes of Exercise per  Session: 10 min  Stress: No Stress Concern Present (07/27/2023)   [employer] of Occupational Health - Occupational Stress Questionnaire    Feeling of Stress : Only a little  Social Connections: Unknown (07/27/2023)   Social Connection and Isolation Panel [NHANES]    Frequency of Communication with Friends and Family: Three times a week    Frequency of Social Gatherings with Friends and Family: Once a week    Attends Religious Services: Patient declined    Active Member of  Clubs or Organizations: No    Attends Banker Meetings: Not on file    Marital Status: Divorced  Intimate Partner Violence: Not At Risk (08/16/2023)   Humiliation, Afraid, Rape, and Kick questionnaire    Fear of Current or Ex-Partner: No    Emotionally Abused: No    Physically Abused: No    Sexually Abused: No   Family History  Problem Relation Age of Onset   Diabetes Father    Heart disease Father    Heart disease Maternal Grandmother    Stroke Maternal Grandfather    Colon cancer Neg Hx    Esophageal cancer Neg Hx    Inflammatory bowel disease Neg Hx    Liver disease Neg Hx    Pancreatic cancer Neg Hx    Rectal cancer Neg Hx    Stomach cancer Neg Hx     Objective: Office vital signs reviewed. BP (!) 109/99   Pulse 66   Temp 98.8 F (37.1 C)   Ht 5\' 10"  (1.778 m)   Wt 165 lb 6.4 oz (75 kg)   SpO2 98%   BMI 23.73 kg/m   Physical Examination:  General: Awake, alert, nontoxic male, No acute distress HEENT: Sclera white. Cardio: regular rate and rhythm, S1S2 heard, no murmurs appreciated Pulm: clear to auscultation bilaterally, no wheezes, rhonchi or rales; normal work of breathing on room air GI: Nondistended but generalized tenderness palpation with predominant tenderness in the right upper and lower quadrants.  Assessment/ Plan: 53 y.o. male   Chronic biliary pancreatitis (HCC) - Plan: Oxycodone HCl 10 MG TABS  Pain management  Insulin dependent type 2 diabetes mellitus  (HCC) - Plan: SEMGLEE, YFGN, 100 UNIT/ML Pen  I have renewed his oxycodone and given him enough to last for 3 months based on the number of days that he is experiencing symptoms.  We discussed that if he is having increasing need for this medication going forward we could consider placing him into pain management but he will let me know if this is needed.  I encouraged him to get the Narcan as we discussed the risks of chronic opioid use, particularly at higher doses.  He voiced good understanding and will get this today.  He is up-to-date on UDS and CSC and the national narcotic database was reviewed with only 1 interval fill that was discussed by his gastroenterologist and myself prior to today's visit  We briefly talked about his diabetes today.  He is now under the care of endocrinology who is managing this with last office visit in March.  I adjusted the current prescriptions in the [facility] to reflect his current use.  He will continue following up as directed   Raliegh Ip, DO [facility] [phone]

## 2023-09-28 ENCOUNTER — Encounter: Payer: Self-pay | Admitting: Gastroenterology

## 2023-09-29 ENCOUNTER — Ambulatory Visit: Payer: MEDICAID | Admitting: "Endocrinology

## 2023-09-30 ENCOUNTER — Other Ambulatory Visit (HOSPITAL_COMMUNITY): Payer: Self-pay

## 2023-09-30 NOTE — Telephone Encounter (Signed)
 Pharmacy Patient Advocate Encounter  Received notification from [facility] that Prior Authorization for FreeStyle Libre 3 Plus Sensor has been APPROVED from 09/27/23 to 03/29/24. Ran test claim, Copay is $0.00. This test claim was processed through [facility]- copay amounts may vary at other pharmacies due to pharmacy/plan contracts, or as the patient moves through the different stages of their insurance plan.   PA #/Case ID/Reference #: [id number]

## 2023-10-28 ENCOUNTER — Other Ambulatory Visit: Payer: Self-pay | Admitting: Family Medicine

## 2023-10-28 DIAGNOSIS — E119 Type 2 diabetes mellitus without complications: Secondary | ICD-10-CM

## 2023-11-04 ENCOUNTER — Ambulatory Visit: Payer: MEDICAID | Admitting: "Endocrinology

## 2023-11-09 ENCOUNTER — Encounter: Payer: Self-pay | Admitting: "Endocrinology

## 2023-11-09 ENCOUNTER — Ambulatory Visit (INDEPENDENT_AMBULATORY_CARE_PROVIDER_SITE_OTHER): Payer: MEDICAID | Admitting: "Endocrinology

## 2023-11-09 ENCOUNTER — Encounter: Payer: MEDICAID | Attending: "Endocrinology | Admitting: Nutrition

## 2023-11-09 VITALS — BP 100/70 | HR 95 | Ht 70.0 in | Wt 175.0 lb

## 2023-11-09 DIAGNOSIS — Z794 Long term (current) use of insulin: Secondary | ICD-10-CM | POA: Diagnosis not present

## 2023-11-09 DIAGNOSIS — E1165 Type 2 diabetes mellitus with hyperglycemia: Secondary | ICD-10-CM | POA: Diagnosis not present

## 2023-11-09 DIAGNOSIS — E7800 Pure hypercholesterolemia, unspecified: Secondary | ICD-10-CM

## 2023-11-09 DIAGNOSIS — E119 Type 2 diabetes mellitus without complications: Secondary | ICD-10-CM | POA: Insufficient documentation

## 2023-11-09 MED ORDER — FREESTYLE LIBRE 3 READER DEVI: 1.0000 | 0 refills | Status: AC

## 2023-11-09 MED ORDER — SYNJARDY XR 5-1000 MG PO TB24: 1.0000 | ORAL_TABLET | Freq: Two times a day (BID) | ORAL | 1 refills | Status: DC

## 2023-11-09 NOTE — Progress Notes (Signed)
 Outpatient Endocrinology Note Steve Newcomer, MD  11/09/23   Steve Romero 24-Dec-1970 [id number]  Referring Provider: Eliodoro Guerin, DO Primary Care Provider: Eliodoro Guerin, DO Reason for consultation: Subjective   Assessment & Plan  Diagnoses and all orders for this visit:  Uncontrolled type 2 diabetes mellitus with hyperglycemia (HCC) -     Ambulatory referral to diabetic education  Long-term insulin  use (HCC)  Pure hypercholesterolemia  Other orders -     Empagliflozin-metFORMIN HCl ER (SYNJARDY XR) 11-998 MG TB24; Take 1 tablet by mouth 2 (two) times daily. -     Continuous Glucose Receiver (FREESTYLE LIBRE 3 READER) DEVI; 1 Device by Does not apply route continuous.   Diabetes Type II complicated by neuropathy,  Lab Results  Component Value Date   GFR 101.48 06/08/2023   Hba1c goal less than 7, current Hba1c is  Lab Results  Component Value Date   HGBA1C 13.7 (H) 09/14/2023   Will recommend the following: Start Synjardy XR 11/998 mg 1 pill a day, if well-tolerated increase it to twice a day with meals (not opting regular metformin given history of GI issues sometimes) Semglee  50 units bid Novolog  20 units tid 15 min before meals  Correction scale: Use in addition to your meal time/short acting insulin  based on blood sugars as follows:  151 - 180: 1 unit 181 - 210: 2 units 211 - 240: 3 units 241 - 270: 4 units 271 - 300: 5 units 301 - 330: 6 units 331 - 360: 7 units 361 - 390: 8 units 391 - 420: 9 units   Ordered diabetes education that patient got today, pt is drinking sweet drinks all day leading to high sugars, was not taking 20 units but only correction scale, had a low in 10/2023, discussed hypoglycemia management again, with plans to try CeQur for being compliant with short acting insulin  as well as sticking to 1 drink a day instead of drinking all day the sugary beverages   No known contraindications/side effects to any of above  medications Glucagon  discussed and prescribed with refills on 09/2023  -Last LD and Tg are as follows: Lab Results  Component Value Date   LDLCALC 98 09/14/2023    Lab Results  Component Value Date   TRIG 72 09/14/2023   -On rosuvastatin  10 mg QD -Follow low fat diet and exercise   -Blood pressure goal <140/90 - Microalbumin/creatinine goal is < 30 -Last MA/Cr is as follows: Lab Results  Component Value Date   MICROALBUR 0.5 09/14/2023   -not on ACE/ARB  -diet changes including salt restriction -limit eating outside -counseled BP targets per standards of diabetes care -uncontrolled blood pressure can lead to retinopathy, nephropathy and cardiovascular and atherosclerotic heart disease  Reviewed and counseled on: -A1C target -Blood sugar targets -Complications of uncontrolled diabetes  -Checking blood sugar before meals and bedtime and bring log next visit -All medications with mechanism of action and side effects -Hypoglycemia management: rule of 15's, Glucagon  Emergency Kit and medical alert ID -low-carb low-fat plate-method diet -At least 20 minutes of physical activity per day -Annual dilated retinal eye exam and foot exam -compliance and follow up needs -follow up as scheduled or earlier if problem gets worse  Call if blood sugar is less than 70 or consistently above 250    Take a 15 gm snack of carbohydrate at bedtime before you go to sleep if your blood sugar is less than 100.    If you are  going to fast after midnight for a test or procedure, ask your physician for instructions on how to reduce/decrease your insulin  dose.    Call if blood sugar is less than 70 or consistently above 250  -Treating a low sugar by rule of 15  (15 gms of sugar every 15 min until sugar is more than 70) If you feel your sugar is low, test your sugar to be sure If your sugar is low (less than 70), then take 15 grams of a fast acting Carbohydrate (3-4 glucose tablets or glucose gel or  4 ounces of juice or regular soda) Recheck your sugar 15 min after treating low to make sure it is more than 70 If sugar is still less than 70, treat again with 15 grams of carbohydrate          Don't drive the hour of hypoglycemia  If unconscious/unable to eat or drink by mouth, use glucagon  injection or nasal spray baqsimi  and call 911. Can repeat again in 15 min if still unconscious.  Return in about 1 week (around 11/16/2023).   I have reviewed current medications, nurse's notes, allergies, vital signs, past medical and surgical history, family medical history, and social history for this encounter. Counseled patient on symptoms, examination findings, lab findings, imaging results, treatment decisions and monitoring and prognosis. The patient understood the recommendations and agrees with the treatment plan. All questions regarding treatment plan were fully answered.  Steve Newcomer, MD  11/09/23    History of Present Illness Steve Romero is a 53 y.o. year old male who presents for evaluation of Type II diabetes mellitus.  Home diabetes regimen: Semglee  40 units bid Novolog  15-20 units tid  COMPLICATIONS -  MI/Stroke -  retinopathy +  neuropathy -  nephropathy  SYMPTOMS REVIEWED + Blurred vision  BLOOD SUGAR DATA BG 290-HI  Lost libre reader, was provided a new reader  Physical Exam  BP 100/70   Pulse 95   Ht 5\' 10"  (1.778 m)   Wt 175 lb (79.4 kg)   SpO2 96%   BMI 25.11 kg/m    Constitutional: well developed, well nourished Head: normocephalic, atraumatic Eyes: sclera anicteric, no redness Neck: supple Lungs: normal respiratory effort Neurology: alert and oriented Skin: dry, no appreciable rashes Musculoskeletal: no appreciable defects Psychiatric: normal mood and affect Diabetic Foot Exam - Simple   No data filed      Current Medications Patient's Medications  New Prescriptions   CONTINUOUS GLUCOSE RECEIVER (FREESTYLE LIBRE 3 READER) DEVI    1  Device by Does not apply route continuous.   EMPAGLIFLOZIN-METFORMIN HCL ER (SYNJARDY XR) 11-998 MG TB24    Take 1 tablet by mouth 2 (two) times daily.  Previous Medications   ACCU-CHEK SOFTCLIX LANCETS LANCETS    Check BS in the morning and at night Dx E11.9   ACETAMINOPHEN  (TYLENOL ) 500 MG TABLET    Take 2 tablets (1,000 mg total) by mouth every 8 (eight) hours as needed for mild pain (pain score 1-3).   ALBUTEROL  (VENTOLIN  HFA) 108 (90 BASE) MCG/ACT INHALER    Inhale 2 puffs into the lungs every 6 (six) hours as needed for wheezing or shortness of breath.   ASPIRIN  EC 81 MG TABLET    Take 81-162 mg by mouth daily as needed (for pain). Swallow whole.   CONTINUOUS BLOOD GLUC RECEIVER (FREESTYLE LIBRE READER) DEVI    1 Units by Does not apply route daily. UAD to test BGs daily. Dx E11.9  CONTINUOUS GLUCOSE SENSOR (FREESTYLE LIBRE 3 PLUS SENSOR) MISC    Check BGs continuously. E11.9 Change sensor every 15 days.   ESOMEPRAZOLE  (NEXIUM ) 20 MG CAPSULE    Take 1 capsule (20 mg total) by mouth daily.   GLUCAGON  (BAQSIMI  ONE PACK) 3 MG/DOSE POWD    Place 1 Device into the nose as needed (Low blood sugar with impaired consciousness).   GLUCOSE BLOOD (ACCU-CHEK GUIDE TEST) TEST STRIP    Check BS in the morning and at night Dx E11.9   INSULIN  PEN NEEDLE (BD PEN NEEDLE NANO 2ND GEN) 32G X 4 MM MISC    UAD to administer insulin  Dx E11.10   LIPASE/PROTEASE/AMYLASE (CREON ) 36000 UNITS CPEP CAPSULE    Take 3 capsules with meals and 1-2  capsule with snacks ( up too 2 snacks )   NALOXONE  (NARCAN ) NASAL SPRAY 4 MG/0.1 ML    Place 1 spray into the nose as directed.   NOVOLOG  RELION 100 UNIT/ML INJECTION    Inject 20 Units into the skin See admin instructions. Inject 15 units into the skin three times a day with meals and 5-10 units as needed with snacks, depending on BGL reading   ONDANSETRON  (ZOFRAN ) 4 MG TABLET    Take 4 mg by mouth every 6 (six) hours as needed for nausea or vomiting.   OXYCODONE  HCL 10 MG TABS     Take 1 tablet (10 mg total) by mouth every 6 (six) hours as needed (for pain). To last 3 months   ROSUVASTATIN  (CRESTOR ) 10 MG TABLET    Take 1 tablet (10 mg total) by mouth at bedtime. If you experience muscle aches take every other day   SEMGLEE , YFGN, 100 UNIT/ML PEN    Inject 50 Units into the skin 2 (two) times daily.  Modified Medications   No medications on file  Discontinued Medications   No medications on file    Allergies Allergies  Allergen Reactions   Desipramine Nausea Only and Rash    Past Medical History Past Medical History:  Diagnosis Date   Acute pancreatitis without necrosis or infection, unspecified    AKI (acute kidney injury) (HCC) 05/17/2022   Anxiety 10/2014   Ascites    Bipolar disorder (HCC) age 74   COPD (chronic obstructive pulmonary disease) (HCC) 2016   Depression age 35   Enlarged prostate    Hyperlipidemia 2016   Hypertension 2013   Nausea vomiting and diarrhea 06/15/2022   Schizoaffective disorder (HCC) 11/13/2014   Sleep apnea    Does not wear c-pap, sleeps in sitting up position per pt   Thyroid  disease 11/2014    Past Surgical History Past Surgical History:  Procedure Laterality Date   BALLOON DILATION N/A 11/07/2020   Procedure: BALLOON DILATION;  Surgeon: Normie Becton., MD;  Location[facility]COPY;  Service: Gastroenterology;  Laterality: N/A;   BALLOON DILATION N/A 06/26/2021   Procedure: BALLOON DILATION;  Surgeon: Brice Campi Albino Alu., MD;  [facility];  Service: Gastroenterology;  Laterality: N/A;   BILIARY BRUSHING  09/23/2023   Procedure: BRUSH BIOPSY, BILE DUCT;  Surgeon: Brice Campi Albino Alu., MD;  Location: [facility] ENDOSCOPY;  Service: Gastroenterology;;   BILIARY DILATION  07/24/2021   Procedure: BILIARY DILATION;  Surgeon: Normie Becton., MD;  Location: [facility] ENDOSCOPY;  Service: Gastroenterology;;   BILIARY DILATION  05/22/2022   Procedure: BILIARY DILATION;  Surgeon: Normie Becton.,  MD;  Location: [facility]OSCOPY;  Service: Gastroenterology;;   BILIARY DILATION  06/16/2022   Procedure:  GASTROSTOMY DILATION;  Surgeon: Normie Becton., MD;  Location: [facility] ENDOSCOPY;  Service: Gastroenterology;;   BILIARY STENT PLACEMENT N/A 10/10/2020   Procedure: BILIARY STENT PLACEMENT;  Surgeon: Ruby Corporal, MD;  Location: [facility];  Service: Endoscopy;  Laterality: N/A;   BILIARY STENT PLACEMENT  11/09/2020   Procedure: BILIARY STENT PLACEMENT;  Surgeon: Brice Campi Albino Alu., MD;  Location[facility] ENDOSCOPY;  Service: Gastroenterology;;   BILIARY STENT PLACEMENT  11/07/2020   Procedure: BILIARY STENT PLACEMENT;  Surgeon: Normie Becton., MD;  Location: [facility] ENDOSCOPY;  Service: Gastroenterology;;   BILIARY STENT PLACEMENT  11/11/2020   Procedure: BILIARY STENT PLACEMENT;  Surgeon: Normie Becton., MD;  Location: [facility] ENDOSCOPY;  Service: Gastroenterology;;   BILIARY STENT PLACEMENT  11/14/2020   Procedure: BILIARY STENT PLACEMENT;  Surgeon: Normie Becton., MD;  Location: [facility] ENDOSCOPY;  Service: Gastroenterology;;   BILIARY STENT PLACEMENT N/A 01/29/2021   Procedure: BILIARY STENT PLACEMENT;  Surgeon: Normie Becton., MD;  Location: [facility] ENDOSCOPY;  Service: Gastroenterology;  Laterality: N/A;   BILIARY STENT PLACEMENT  06/26/2021   Procedure: BILIARY STENT PLACEMENT;  Surgeon: Brice Campi Albino Alu., MD;  Location: [facility] ENDOSCOPY;  Service: Gastroenterology;;   BILIARY STENT PLACEMENT  07/24/2021   Procedure: BILIARY STENT PLACEMENT;  Surgeon: Normie Becton., MD;  Location: [facility] ENDOSCOPY;  Service: Gastroenterology;;   BILIARY STENT PLACEMENT N/A 09/23/2023   Procedure: INSERTION, STENT, BILE DUCT;  Surgeon: Normie Becton., MD;  Location: [facility] ENDOSCOPY;  Service: Gastroenterology;  Laterality: N/A;   BIOPSY  11/07/2020   Procedure: BIOPSY;  Surgeon: Brice Campi Albino Alu., MD;  Location: [facility] ENDOSCOPY;  Service: Gastroenterology;;   BIOPSY   12/20/2020   Procedure: BIOPSY;  Surgeon: Normie Becton., MD;  Location: [facility]OSCOPY;  Service: Gastroenterology;;   BIOPSY  06/26/2021   Procedure: BIOPSY;  Surgeon: Normie Becton., MD;  Location: [facility] ENDOSCOPY;  Service: Gastroenterology;;   CARPAL TUNNEL RELEASE Left 02/21/2016   Procedure: CARPAL TUNNEL RELEASE;  Surgeon: Darrin Emerald, MD;  Location: [facility];  Service: Orthopedics;  Laterality: Left;   CHOLECYSTECTOMY N/A 10/09/2020   Procedure: LAPAROSCOPIC CHOLECYSTECTOMY;  Surgeon: Awilda Bogus, MD;  Location: [facility];  Service: General;  Laterality: N/A;   CYST ENTEROSTOMY N/A 11/07/2020   Procedure: CYST ENTEROSTOMY;  Surgeon: Normie Becton., MD;  Location: [facility] ENDOSCOPY;  Service: Gastroenterology;  Laterality: N/A;   CYST GASTROSTOMY  11/11/2020   Procedure: CYST NECROSECTOMY;  Surgeon: Brice Campi Albino Alu., MD;  Location: [facility] ENDOSCOPY;  Service: Gastroenterology;;   CYST GASTROSTOMY  11/14/2020   Procedure: CYST NECROSECTOMY;  Surgeon: Normie Becton., MD;  Location: [facility] ENDOSCOPY;  Service: Gastroenterology;;   CYST GASTROSTOMY  06/16/2022   Procedure: CYST GASTROSTOMY;  Surgeon: Normie Becton., MD;  Location: [facility] ENDOSCOPY;  Service: Gastroenterology;;   CYST REMOVAL HAND     CYSTOSCOPY  06/26/2021   Procedure: CYSTOGASTROSTOMY;  Surgeon: Mansouraty, Albino Alu., MD;  Location: [facility] ENDOSCOPY;  Service: Gastroenterology;;   ENDOSCOPIC RETROGRADE CHOLANGIOPANCREATOGRAPHY (ERCP) WITH PROPOFOL  N/A 11/09/2020   Procedure: ENDOSCOPIC RETROGRADE CHOLANGIOPANCREATOGRAPHY (ERCP) WITH PROPOFOL ;  Surgeon: Normie Becton., MD;  Location: [facility] ENDOSCOPY;  Service: Gastroenterology;  Laterality: N/A;   ENDOSCOPIC RETROGRADE CHOLANGIOPANCREATOGRAPHY (ERCP) WITH PROPOFOL  N/A 12/20/2020   Procedure: ENDOSCOPIC RETROGRADE CHOLANGIOPANCREATOGRAPHY (ERCP) WITH PROPOFOL ;  Surgeon: Brice Campi Albino Alu., MD;  Location: [facility] ENDOSCOPY;  Service:  Gastroenterology;  Laterality: N/A;   ENDOSCOPIC RETROGRADE CHOLANGIOPANCREATOGRAPHY (ERCP) WITH PROPOFOL  N/A 01/29/2021   Procedure: ENDOSCOPIC RETROGRADE CHOLANGIOPANCREATOGRAPHY (ERCP) WITH PROPOFOL ;  Surgeon: Normie Becton., MD;  Location: [facility]OSCOPY;  Service: Gastroenterology;  Laterality: N/A;   ENDOSCOPIC RETROGRADE CHOLANGIOPANCREATOGRAPHY (ERCP) WITH PROPOFOL  N/A 07/24/2021   Procedure: ENDOSCOPIC RETROGRADE CHOLANGIOPANCREATOGRAPHY (ERCP) WITH PROPOFOL ;  Surgeon: Brice Campi Albino Alu., MD;  Location: [facility] ENDOSCOPY;  Service: Gastroenterology;  Laterality: N/A;   ENDOSCOPIC RETROGRADE CHOLANGIOPANCREATOGRAPHY (ERCP) WITH PROPOFOL  N/A 09/23/2023   Procedure: ENDOSCOPIC RETROGRADE CHOLANGIOPANCREATOGRAPHY (ERCP) WITH PROPOFOL ;  Surgeon: Brice Campi Albino Alu., MD;  Location: [facility] ENDOSCOPY;  Service: Gastroenterology;  Laterality: N/A;   ERCP N/A 10/10/2020   Procedure: ENDOSCOPIC RETROGRADE CHOLANGIOPANCREATOGRAPHY (ERCP);  Surgeon: Ruby Corporal, MD;  Location: [facility];  Service: Endoscopy;  Laterality: N/A;   ERCP     ERCP N/A 05/22/2022   Procedure: ENDOSCOPIC RETROGRADE CHOLANGIOPANCREATOGRAPHY (ERCP);  Surgeon: Normie Becton., MD;  Location: [facility]OSCOPY;  Service: Gastroenterology;  Laterality: N/A;   ESOPHAGOGASTRODUODENOSCOPY N/A 11/11/2020   Procedure: ESOPHAGOGASTRODUODENOSCOPY (EGD);  Surgeon: Normie Becton., MD;  Location: [facility] ENDOSCOPY;  Service: Gastroenterology;  Laterality: N/A;   ESOPHAGOGASTRODUODENOSCOPY N/A 11/14/2020   Procedure: ESOPHAGOGASTRODUODENOSCOPY (EGD);  Surgeon: Normie Becton., MD;  Location: [facility] ENDOSCOPY;  Service: Gastroenterology;  Laterality: N/A;   ESOPHAGOGASTRODUODENOSCOPY (EGD) WITH PROPOFOL  N/A 11/09/2020   Procedure: ESOPHAGOGASTRODUODENOSCOPY (EGD) WITH PROPOFOL ;  Surgeon: Brice Campi Albino Alu., MD;  Location: [facility] ENDOSCOPY;  Service: Gastroenterology;  Laterality: N/A;   ESOPHAGOGASTRODUODENOSCOPY (EGD) WITH  PROPOFOL  N/A 11/07/2020   Procedure: ESOPHAGOGASTRODUODENOSCOPY (EGD) WITH PROPOFOL ;  Surgeon: Brice Campi Albino Alu., MD;  Location: [facility] ENDOSCOPY;  Service: Gastroenterology;  Laterality: N/A;   ESOPHAGOGASTRODUODENOSCOPY (EGD) WITH PROPOFOL  N/A 12/20/2020   Procedure: ESOPHAGOGASTRODUODENOSCOPY (EGD) WITH PROPOFOL ;  Surgeon: Brice Campi Albino Alu., MD;  Location: [facility] ENDOSCOPY;  Service: Gastroenterology;  Laterality: N/A;   ESOPHAGOGASTRODUODENOSCOPY (EGD) WITH PROPOFOL  N/A 06/26/2021   Procedure: ESOPHAGOGASTRODUODENOSCOPY (EGD) WITH PROPOFOL ;  Surgeon: Brice Campi Albino Alu., MD;  Location: [facility] ENDOSCOPY;  Service: Gastroenterology;  Laterality: N/A;   ESOPHAGOGASTRODUODENOSCOPY (EGD) WITH PROPOFOL  N/A 07/24/2021   Procedure: ESOPHAGOGASTRODUODENOSCOPY (EGD) WITH PROPOFOL ;  Surgeon: Brice Campi Albino Alu., MD;  Location: [facility] ENDOSCOPY;  Service: Gastroenterology;  Laterality: N/A;  AXIOS STENT   ESOPHAGOGASTRODUODENOSCOPY (EGD) WITH PROPOFOL  N/A 05/22/2022   Procedure: ESOPHAGOGASTRODUODENOSCOPY (EGD) WITH PROPOFOL ;  Surgeon: Brice Campi Albino Alu., MD;  Location: [facility] ENDOSCOPY;  Service: Gastroenterology;  Laterality: N/A;   ESOPHAGOGASTRODUODENOSCOPY (EGD) WITH PROPOFOL  N/A 06/16/2022   Procedure: ESOPHAGOGASTRODUODENOSCOPY (EGD) WITH PROPOFOL ;  Surgeon: Brice Campi Albino Alu., MD;  Location: [facility] ENDOSCOPY;  Service: Gastroenterology;  Laterality: N/A;   ESOPHAGOGASTRODUODENOSCOPY (EGD) WITH PROPOFOL  N/A 07/30/2022   Procedure: ESOPHAGOGASTRODUODENOSCOPY (EGD) WITH PROPOFOL ;  Surgeon: Brice Campi Albino Alu., MD;  Location: [facility] ENDOSCOPY;  Service: Gastroenterology;  Laterality: N/A;   ESOPHAGOGASTRODUODENOSCOPY (EGD) WITH PROPOFOL  N/A 03/30/2023   Procedure: ESOPHAGOGASTRODUODENOSCOPY (EGD) WITH PROPOFOL ;  Surgeon: Kenney Peacemaker, MD;  Location: [facility] ENDOSCOPY;  Service: Gastroenterology;  Laterality: N/A;   EUS  11/14/2020   Procedure: UPPER ENDOSCOPIC ULTRASOUND (EUS) LINEAR;  Surgeon:  Normie Becton., MD;  Location: [facility] ENDOSCOPY;  Service: Gastroenterology;;   EUS N/A 06/26/2021   Procedure: UPPER ENDOSCOPIC ULTRASOUND (EUS) RADIAL;  Surgeon: Normie Becton., MD;  Location: [facility] ENDOSCOPY;  Service: Gastroenterology;  Laterality: N/A;   EUS N/A 05/22/2022   Procedure: UPPER ENDOSCOPIC ULTRASOUND (EUS) LINEAR;  Surgeon: Normie Becton., MD;  Location: [facility] ENDOSCOPY;  Service: Gastroenterology;  Laterality: N/A;   EUS N/A 06/16/2022   Procedure: UPPER ENDOSCOPIC ULTRASOUND (EUS) LINEAR;  Surgeon: Normie Becton., MD;  Location: [facility] ENDOSCOPY;  Service: Gastroenterology;  Laterality: N/A;   FINE NEEDLE ASPIRATION  05/22/2022   Procedure: FINE NEEDLE ASPIRATION;  Surgeon: Brice Campi Albino Alu., MD;  Location: [facility]OSCOPY;  Service: Gastroenterology;;   GASTROINTESTINAL STENT REMOVAL  12/20/2020   Procedure: GASTROINTESTINAL STENT REMOVAL;  Surgeon: Normie Becton., MD;  Location: [facility] ENDOSCOPY;  Service: Gastroenterology;;  cyst gastrostomy stent and double pig tail stents x2 removed   HERNIA REPAIR Left 2002   groin   INGUINAL HERNIA REPAIR Left 04/05/2017   Procedure: RECURRENT HERNIA REPAIR INGUINAL ADULT WITH MESH;  Surgeon: Alanda Allegra, MD;  Location: [facility];  Service: General;  Laterality: Left;   MASS EXCISION Right 04/29/2020   Procedure: EXCISION MASS RIGHT WRIST;  Surgeon: Brunilda Capra, MD;  Location: [facility];  Service: Orthopedics;  Laterality: Right;   MASS EXCISION Right 10/09/2020   Procedure: EXCISION MASS, ABDOMINAL WALL, 2CM;  Surgeon: Awilda Bogus, MD;  Location: [facility];  Service: General;  Laterality: Right;   PANCREATIC STENT PLACEMENT  06/26/2021   Procedure: AXIOS STENT PLACEMENT;  Surgeon: Normie Becton., MD;  Location: [facility] ENDOSCOPY;  Service: Gastroenterology;;   PANCREATIC STENT PLACEMENT  06/16/2022   Procedure: PANCREATIC STENT PLACEMENT;  Surgeon: Normie Becton., MD;   Location: [facility]OSCOPY;  Service: Gastroenterology;;   REMOVAL OF STONES  12/20/2020   Procedure: REMOVAL OF STONES;  Surgeon: Normie Becton., MD;  Location: [facility]COPY;  Service: Gastroenterology;;   REMOVAL OF STONES  07/24/2021   Procedure: REMOVAL OF STONES;  Surgeon: Normie Becton., MD;  Location[facility] ENDOSCOPY;  Service: Gastroenterology;;   REMOVAL OF STONES  05/22/2022   Procedure: REMOVAL OF STONES;  Surgeon: Normie Becton., MD;  Location: [facility]OSCOPY;  Service: Gastroenterology;;   [address] N/A 10/10/2020   Procedure: [address];  Surgeon: Ruby Corporal, MD;  Location: [facility];  Service: Endoscopy;  Laterality: N/A;   SPHINCTEROTOMY  07/24/2021   Procedure: SPHINCTEROTOMY;  Surgeon: Mansouraty, Albino Alu., MD;  Location: [facility] ENDOSCOPY;  Service: Gastroenterology;;   SPLENECTOMY, TOTAL N/A 11/17/2022   Procedure: SPLENECTOMY;  Surgeon: Lujean Sake, MD;  Location: [facility] OR;  Service: General;  Laterality: N/A;   SPYGLASS CHOLANGIOSCOPY N/A 12/20/2020   Procedure: [id number] CHOLANGIOSCOPY;  Surgeon: Normie Becton., MD;  Location: [facility] ENDOS[facility];  Service: Gastroenterology;  Laterality: N/A;   SPYGLASS CHOLANGIOSCOPY N/A 07/24/2021   Procedure: SPYGLASS CHOLANGIOSCOPY;  Surgeon: Normie Becton., MD;  Location: [facility] ENDOSCOPY;  Service: Gastroenterology;  Laterality: N/A;   STENT REMOVAL  11/09/2020   Procedure: STENT REMOVAL;  Surgeon: Normie Becton., MD;  Location: [facility] ENDOSCOPY;  Service: Gastroenterology;;   [place] REMOVAL  11/11/2020   Procedure: STENT REMOVAL;  Surgeon: Normie Becton., MD;  Location: [facility] ENDOSCOPY;  Service: Gastroenterology;;   [place] REMOVAL  11/14/2020   Procedure: STENT REMOVAL;  Surgeon: Normie Becton., MD;  Location: [facility] ENDOSCOPY;  Service: Gastroenterology;;   [place] REMOVAL  12/20/2020   Procedure: STENT REMOVAL;  Surgeon: Normie Becton., MD;  Location: [facility];   Service: Gastroenterology;;  biliary x2   STENT REMOVAL  07/24/2021   Procedure: AXIOS STENT REMOVAL;  Surgeon: Normie Becton., MD;  Location: [facility] ENDOSCOPY;  Service: Gastroenterology;;   [place] REMOVAL  07/30/2022   Procedure: STENT REMOVAL;  Surgeon: Normie Becton., MD;  Location: [facility] ENDOSCOPY;  Service: Gastroenterology;;   UPPER ESOPHAGEAL ENDOSCOPIC ULTRASOUND (EUS) N/A 11/07/2020   Procedure: UPPER ESOPHAGEAL ENDOSCOPIC ULTRASOUND (EUS);  Surgeon: Normie Becton., MD;  Location: [facility] ENDOSCOPY;  Service: Gastroenterology;  Laterality: N/A;  UPPER GASTROINTESTINAL ENDOSCOPY     WOUND DEBRIDEMENT  11/09/2020   Procedure: CYST NECROSECTOMY;  Surgeon: Brice Campi Albino Alu., MD;  Location: [facility] ENDOSCOPY;  Service: Gastroenterology;;    Family History family history includes Diabetes in his father; Heart disease in his father and maternal grandmother; Stroke in his maternal grandfather.  Social History Social History   Socioeconomic History   Marital status: Single    Spouse name: Not on file   Number of children: 0   Years of education: Not on file   Highest education level: GED or equivalent  Occupational History   Not on file  Tobacco Use   Smoking status: Every Day    Current packs/day: 0.25    Average packs/day: 0.3 packs/day for 26.0 years (6.5 ttl pk-yrs)    Types: Cigarettes   Smokeless tobacco: Never   Tobacco comments:    4-5 cig daily as of 10/07/2020.  Vaping Use   Vaping status: Never Used  Substance and Sexual Activity   Alcohol use: No   Drug use: No   Sexual activity: Never    Birth control/protection: None  Other Topics Concern   Not on file  Social History Narrative   Not on file   Social Drivers of Health   Financial Resource Strain: Low Risk  (07/27/2023)   Overall Financial Resource Strain (CARDIA)    Difficulty of Paying Living Expenses: Not very hard  Food Insecurity: No Food Insecurity (08/16/2023)   Hunger Vital Sign     Worried About Running Out of Food in the Last Year: Never true    Ran Out of Food in the Last Year: Never true  Recent Concern: Food Insecurity - Food Insecurity Present (07/27/2023)   Hunger Vital Sign    Worried About Running Out of Food in the Last Year: Never true    Ran Out of Food in the Last Year: Sometimes true  Transportation Needs: No Transportation Needs (08/16/2023)   PRAPARE - Administrator, Civil Service (Medical): No    Lack of Transportation (Non-Medical): No  Physical Activity: Insufficiently Active (07/27/2023)   Exercise Vital Sign    Days of Exercise per Week: 2 days    Minutes of Exercise per Session: 10 min  Stress: No Stress Concern Present (07/27/2023)   [employer] of Occupational Health - Occupational Stress Questionnaire    Feeling of Stress : Only a little  Social Connections: Unknown (07/27/2023)   Social Connection and Isolation Panel [NHANES]    Frequency of Communication with Friends and Family: Three times a week    Frequency of Social Gatherings with Friends and Family: Once a week    Attends Religious Services: Patient declined    Active Member of Clubs or Organizations: No    Attends Engineer, structural: Not on file    Marital Status: Divorced  Intimate Partner Violence: Not At Risk (08/16/2023)   Humiliation, Afraid, Rape, and Kick questionnaire    Fear of Current or Ex-Partner: No    Emotionally Abused: No    Physically Abused: No    Sexually Abused: No    Lab Results  Component Value Date   HGBA1C 13.7 (H) 09/14/2023   HGBA1C >14.0 (H) 06/08/2023   HGBA1C 14.9 (H) 03/30/2023   Lab Results  Component Value Date   CHOL 158 09/14/2023   Lab Results  Component Value Date   HDL 44 09/14/2023   Lab Results  Component Value Date   LDLCALC 98 09/14/2023  Lab Results  Component Value Date   TRIG 72 09/14/2023   Lab Results  Component Value Date   CHOLHDL 3.6 09/14/2023   Lab Results  Component Value Date    CREATININE 0.79 09/14/2023   Lab Results  Component Value Date   GFR 101.48 06/08/2023   Lab Results  Component Value Date   MICROALBUR 0.5 09/14/2023      Component Value Date/Time   NA 136 09/14/2023 0940   NA 135 08/18/2023 1205   K 4.8 09/14/2023 0940   CL 98 09/14/2023 0940   CO2 32 09/14/2023 0940   GLUCOSE 302 (H) 09/14/2023 0940   BUN 16 09/14/2023 0940   BUN 15 08/18/2023 1205   CREATININE 0.79 09/14/2023 0940   CALCIUM  9.7 09/14/2023 0940   PROT 6.7 09/23/2023 1219   PROT 5.7 (L) 08/18/2023 1205   ALBUMIN  3.5 09/23/2023 1219   ALBUMIN  3.7 (L) 08/18/2023 1205   AST 48 (H) 09/23/2023 1219   ALT 76 (H) 09/23/2023 1219   ALKPHOS 347 (H) 09/23/2023 1219   BILITOT 0.3 09/23/2023 1219   BILITOT <0.2 08/18/2023 1205   GFRNONAA >60 08/13/2023 0450   GFRNONAA 89 04/22/2015 0828   GFRAA 85 10/04/2019 1433   GFRAA >89 04/22/2015 0828      Latest Ref Rng & Units 09/14/2023    9:40 AM 08/18/2023   12:05 PM 08/13/2023    4:50 AM  BMP  Glucose 65 - 99 mg/dL 161  096  045   BUN 7 - 25 mg/dL 16  15  18    Creatinine 0.70 - 1.30 mg/dL 4.09  8.11  9.14   BUN/Creat Ratio 6 - 22 (calc) SEE NOTE:  20    Sodium 135 - 146 mmol/L 136  135  132   Potassium 3.5 - 5.3 mmol/L 4.8  4.7  3.5   Chloride 98 - 110 mmol/L 98  95  98   CO2 20 - 32 mmol/L 32  26  26   Calcium  8.6 - 10.3 mg/dL 9.7  9.4  8.4        Component Value Date/Time   WBC 14.2 (H) 08/18/2023 1205   WBC 13.6 (H) 08/12/2023 0257   RBC 4.36 08/18/2023 1205   RBC 4.92 08/12/2023 0257   HGB 13.8 08/18/2023 1205   HCT 41.0 08/18/2023 1205   PLT 484 (H) 08/18/2023 1205   MCV 94 08/18/2023 1205   MCH 31.7 08/18/2023 1205   MCH 31.7 08/12/2023 0257   MCHC 33.7 08/18/2023 1205   MCHC 35.1 08/12/2023 0257   RDW 12.4 08/18/2023 1205   LYMPHSABS 3.7 (H) 08/18/2023 1205   MONOABS 0.7 07/07/2023 1310   EOSABS 0.1 08/18/2023 1205   BASOSABS 0.1 08/18/2023 1205     Parts of this note may have been dictated using  voice recognition software. There may be variances in spelling and vocabulary which are unintentional. Not all errors are proofread. Please notify the [facility] if any discrepancies are noted or if the meaning of any statement is not clear.

## 2023-11-09 NOTE — Progress Notes (Signed)
 Patient is here today with his sister.  He saw Dr. Vertell Gory today and blood sugars are very high, without a reason given to her.  After questioning the patient, it appears he is drinking sweetened drinks-Mt. Dew all day long according the the sister, and patient confirmed this.  I was able to have him limit these to once a day, with an extra 2uints of insulin  given for this.  Pt. Reports being very thirsty and it was explained to him the the high readings are causing his thirst-as a result of the drinks he is drinking.   SBGM:  Libre 3plus.  Pt. Lost reader 2 weeks ago after moving in with his elderly mother.  He was given a new reader and date/time set, and he was reminded to bring it back with him on his next vist with Dr. Vertell Gory next week.                         Insulin  dose:  Semglee : 50 HS.  Pt. Says he is taking this nightly.  Stressed need to do this. Explained that it is half of his daily dose of insulin                        Novolog : 20u plus correction dose.  I do not believe he is always taking this.  He was shown the [facility] and is interested in using this.  I will get him a sample and train him                                     when he returns next week.   Discussed need to have protein with each meal, and suggestions given for this.  Also stressed need to stop all sweet drinks, fruit juices and cold cereal and milk-something he dose several times a week.  Suggestions given for other breakfast meals like peanut butter and jelly sandwiches.  Limit milk to 3 glasses per day with meals.   Low blood sugar treatments:  Discussed symptoms, and treatments for this: 1/2 glass of soda or fruit juice wait 15 minutes, and repeat if still low.

## 2023-11-09 NOTE — Patient Instructions (Addendum)
 Stop sweet drinks Stop cold cereal and milk Bring sensor reader with you to each visit

## 2023-11-16 ENCOUNTER — Encounter: Payer: MEDICAID | Admitting: Nutrition

## 2023-11-16 ENCOUNTER — Encounter: Payer: Self-pay | Admitting: "Endocrinology

## 2023-11-16 ENCOUNTER — Ambulatory Visit (INDEPENDENT_AMBULATORY_CARE_PROVIDER_SITE_OTHER): Payer: MEDICAID | Admitting: "Endocrinology

## 2023-11-16 ENCOUNTER — Telehealth: Payer: Self-pay

## 2023-11-16 VITALS — BP 120/82 | HR 75 | Ht 70.0 in | Wt 170.0 lb

## 2023-11-16 DIAGNOSIS — E7800 Pure hypercholesterolemia, unspecified: Secondary | ICD-10-CM | POA: Diagnosis not present

## 2023-11-16 DIAGNOSIS — Z794 Long term (current) use of insulin: Secondary | ICD-10-CM | POA: Diagnosis not present

## 2023-11-16 DIAGNOSIS — E1165 Type 2 diabetes mellitus with hyperglycemia: Secondary | ICD-10-CM

## 2023-11-16 DIAGNOSIS — E119 Type 2 diabetes mellitus without complications: Secondary | ICD-10-CM

## 2023-11-16 LAB — POCT GLYCOSYLATED HEMOGLOBIN (HGB A1C): Hemoglobin A1C: 10.5 % — AB (ref 4.0–5.6)

## 2023-11-16 NOTE — Patient Instructions (Signed)
 Will recommend the following: Start Synjardy  XR 11/998 mg 1 pill a day, if well-tolerated increase it to twice a day with meals Semglee  50 units bid Novolog  24 units tid 15 min before meals  Correction scale: Use in addition to your meal time/short acting insulin  based on blood sugars as follows:  151 - 180: 1 unit 181 - 210: 2 units 211 - 240: 3 units 241 - 270: 4 units 271 - 300: 5 units 301 - 330: 6 units 331 - 360: 7 units 361 - 390: 8 units 391 - 420: 9 units

## 2023-11-16 NOTE — Progress Notes (Signed)
 Outpatient Endocrinology Note [name], MD  11/16/23   Steve Romero 1971-01-06 [id number]  Referring Provider: Eliodoro Guerin, DO Primary Care Provider: Eliodoro Guerin, DO Reason for consultation: Subjective   Assessment & Plan  Diagnoses and all orders for this visit:  Uncontrolled type 2 diabetes mellitus with hyperglycemia (HCC) -     POCT glycosylated hemoglobin (Hb A1C)  Long-term insulin  use (HCC)  Pure hypercholesterolemia   Diabetes Type II complicated by neuropathy,  Lab Results  Component Value Date   GFR 101.48 06/08/2023   Hba1c goal less than 7, current Hba1c is  Lab Results  Component Value Date   HGBA1C 10.5 (A) 11/16/2023   Will recommend the following: Start Synjardy  XR 11/998 mg 1 pill a day, if well-tolerated increase it to twice a day with meals (not opting regular metformin given history of GI issues sometimes): Sending for prior auth per pt  Semglee  50 units bid Novolog  24 units tid 15 min before meals  Correction scale: Use in addition to your meal time/short acting insulin  based on blood sugars as follows:  151 - 180: 1 unit 181 - 210: 2 units 211 - 240: 3 units 241 - 270: 4 units 271 - 300: 5 units 301 - 330: 6 units 331 - 360: 7 units 361 - 390: 8 units 391 - 420/High : 9 units   11/09/23:Ordered diabetes education that patient got today, pt is drinking sweet drinks all day leading to high sugars, was not taking 20 units but only correction scale, had a low in 10/2023, discussed hypoglycemia management again, with plans to try CeQur for being compliant with short acting insulin  as well as sticking to 1 drink a day instead of drinking all day the sugary beverages 11/16/23 patient again got diabetes education today.  Patient repeatedly says that he has been doing everything right, taking up to 6 shots of insulin  a: day, 2 of the Semglee  and up to 4 of the NovoLog  15 units with meals.  His blood sugars continue to stay  high except rarely knows that patient keeps telling because of his pancreas.  Reinforced compliance and discussed the dangers of continuing dose elevation of Novolog  in case of noncompliance, patient understands and agreed  No known contraindications/side effects to any of above medications Glucagon  discussed and prescribed with refills on 09/2023  -Last LD and Tg are as follows: Lab Results  Component Value Date   LDLCALC 98 09/14/2023    Lab Results  Component Value Date   TRIG 72 09/14/2023   -On rosuvastatin  10 mg QD -Follow low fat diet and exercise   -Blood pressure goal <140/90 - Microalbumin/creatinine goal is < 30 -Last MA/Cr is as follows: Lab Results  Component Value Date   MICROALBUR 0.5 09/14/2023   -not on ACE/ARB  -diet changes including salt restriction -limit eating outside -counseled BP targets per standards of diabetes care -uncontrolled blood pressure can lead to retinopathy, nephropathy and cardiovascular and atherosclerotic heart disease  Reviewed and counseled on: -A1C target -Blood sugar targets -Complications of uncontrolled diabetes  -Checking blood sugar before meals and bedtime and bring log next visit -All medications with mechanism of action and side effects -Hypoglycemia management: rule of 15's, Glucagon  Emergency Kit and medical alert ID -low-carb low-fat plate-method diet -At least 20 minutes of physical activity per day -Annual dilated retinal eye exam and foot exam -compliance and follow up needs -follow up as scheduled or earlier if problem gets worse  Call if blood sugar is less than 70 or consistently above 250    Take a 15 gm snack of carbohydrate at bedtime before you go to sleep if your blood sugar is less than 100.    If you are going to fast after midnight for a test or procedure, ask your physician for instructions on how to reduce/decrease your insulin  dose.    Call if blood sugar is less than 70 or consistently above  250  -Treating a low sugar by rule of 15  (15 gms of sugar every 15 min until sugar is more than 70) If you feel your sugar is low, test your sugar to be sure If your sugar is low (less than 70), then take 15 grams of a fast acting Carbohydrate (3-4 glucose tablets or glucose gel or 4 ounces of juice or regular soda) Recheck your sugar 15 min after treating low to make sure it is more than 70 If sugar is still less than 70, treat again with 15 grams of carbohydrate          Don't drive the hour of hypoglycemia  If unconscious/unable to eat or drink by mouth, use glucagon  injection or nasal spray baqsimi  and call 911. Can repeat again in 15 min if still unconscious.  Return in about 4 weeks (around 12/14/2023).   I have reviewed current medications, nurse's notes, allergies, vital signs, past medical and surgical history, family medical history, and social history for this encounter. Counseled patient on symptoms, examination findings, lab findings, imaging results, treatment decisions and monitoring and prognosis. The patient understood the recommendations and agrees with the treatment plan. All questions regarding treatment plan were fully answered.  [name], MD  11/16/23    History of Present Illness COSIMO DOCTOR is a 53 y.o. year old male who presents for evaluation of Type II diabetes mellitus.  Home diabetes regimen: Semglee  40 units bid Novolog  15-20 units tid  COMPLICATIONS -  MI/Stroke -  retinopathy +  neuropathy -  nephropathy  SYMPTOMS REVIEWED + Blurred vision  BLOOD SUGAR DATA  CGM interpretation: At today's visit, we reviewed her CGM downloads. The full report is scanned in the media. Reviewing the CGM trends, BG are elevated across the day with the random lows between.   Physical Exam  BP 120/82   Pulse 75   Ht 5\' 10"  (1.778 m)   Wt 170 lb (77.1 kg)   SpO2 97%   BMI 24.39 kg/m    Constitutional: well developed, well nourished Head:  normocephalic, atraumatic Eyes: sclera anicteric, no redness Neck: supple Lungs: normal respiratory effort Neurology: alert and oriented Skin: dry, no appreciable rashes Musculoskeletal: no appreciable defects Psychiatric: normal mood and affect Diabetic Foot Exam - Simple   No data filed      Current Medications Patient's Medications  New Prescriptions   No medications on file  Previous Medications   ACCU-CHEK SOFTCLIX LANCETS LANCETS    Check BS in the morning and at night Dx E11.9   ACETAMINOPHEN  (TYLENOL ) 500 MG TABLET    Take 2 tablets (1,000 mg total) by mouth every 8 (eight) hours as needed for mild pain (pain score 1-3).   ALBUTEROL  (VENTOLIN  HFA) 108 (90 BASE) MCG/ACT INHALER    Inhale 2 puffs into the lungs every 6 (six) hours as needed for wheezing or shortness of breath.   ASPIRIN  EC 81 MG TABLET    Take 81-162 mg by mouth daily as needed (for pain). Swallow whole.  CONTINUOUS BLOOD GLUC RECEIVER (FREESTYLE LIBRE READER) DEVI    1 Units by Does not apply route daily. UAD to test BGs daily. Dx E11.9   CONTINUOUS GLUCOSE RECEIVER (FREESTYLE LIBRE 3 READER) DEVI    1 Device by Does not apply route continuous.   CONTINUOUS GLUCOSE SENSOR (FREESTYLE LIBRE 3 PLUS SENSOR) MISC    Check BGs continuously. E11.9 Change sensor every 15 days.   EMPAGLIFLOZIN-METFORMIN HCL ER (SYNJARDY  XR) 11-998 MG TB24    Take 1 tablet by mouth 2 (two) times daily.   ESOMEPRAZOLE  (NEXIUM ) 20 MG CAPSULE    Take 1 capsule (20 mg total) by mouth daily.   GLUCAGON  (BAQSIMI  ONE PACK) 3 MG/DOSE POWD    Place 1 Device into the nose as needed (Low blood sugar with impaired consciousness).   GLUCOSE BLOOD (ACCU-CHEK GUIDE TEST) TEST STRIP    Check BS in the morning and at night Dx E11.9   INSULIN  PEN NEEDLE (BD PEN NEEDLE NANO 2ND GEN) 32G X 4 MM MISC    UAD to administer insulin  Dx E11.10   LIPASE/PROTEASE/AMYLASE (CREON ) 36000 UNITS CPEP CAPSULE    Take 3 capsules with meals and 1-2  capsule with snacks ( up  too 2 snacks )   NALOXONE  (NARCAN ) NASAL SPRAY 4 MG/0.1 ML    Place 1 spray into the nose as directed.   NOVOLOG  RELION 100 UNIT/ML INJECTION    Inject 20 Units into the skin See admin instructions. Inject 15 units into the skin three times a day with meals and 5-10 units as needed with snacks, depending on BGL reading   ONDANSETRON  (ZOFRAN ) 4 MG TABLET    Take 4 mg by mouth every 6 (six) hours as needed for nausea or vomiting.   OXYCODONE  HCL 10 MG TABS    Take 1 tablet (10 mg total) by mouth every 6 (six) hours as needed (for pain). To last 3 months   ROSUVASTATIN  (CRESTOR ) 10 MG TABLET    Take 1 tablet (10 mg total) by mouth at bedtime. If you experience muscle aches take every other day   SEMGLEE , YFGN, 100 UNIT/ML PEN    Inject 50 Units into the skin 2 (two) times daily.  Modified Medications   No medications on file  Discontinued Medications   No medications on file    Allergies Allergies  Allergen Reactions   Desipramine Nausea Only and Rash    Past Medical History Past Medical History:  Diagnosis Date   Acute pancreatitis without necrosis or infection, unspecified    AKI (acute kidney injury) (HCC) 05/17/2022   Anxiety 10/2014   Ascites    Bipolar disorder (HCC) age 83   COPD (chronic obstructive pulmonary disease) (HCC) 2016   Depression age 54   Enlarged prostate    Hyperlipidemia 2016   Hypertension 2013   Nausea vomiting and diarrhea 06/15/2022   Schizoaffective disorder (HCC) 11/13/2014   Sleep apnea    Does not wear c-pap, sleeps in sitting up position per pt   Thyroid  disease 11/2014    Past Surgical History Past Surgical History:  Procedure Laterality Date   BALLOON DILATION N/A 11/07/2020   Procedure: BALLOON DILATION;  Surgeon: Normie Becton., MD;  Location[facility] ENDOSCOPY;  Service: Gastroenterology;  Laterality: N/A;   BALLOON DILATION N/A 06/26/2021   Procedure: BALLOON DILATION;  Surgeon: Brice Campi Albino Alu., MD;  Location: [facility] ENDOSCOPY;   Service: Gastroenterology;  Laterality: N/A;   BILIARY BRUSHING  09/23/2023   Procedure: BRUSH BIOPSY, BILE  DUCT;  Surgeon: Normie Becton., MD;  Location: [facility] ENDOSCOPY;  Service: Gastroenterology;;   BILIARY DILATION  07/24/2021   Procedure: BILIARY DILATION;  Surgeon: Normie Becton., MD;  Location: [facility] ENDOSCOPY;  Service: Gastroenterology;;   BILIARY DILATION  05/22/2022   Procedure: BILIARY DILATION;  Surgeon: Normie Becton., MD;  Location: [facility] ENDOSCOPY;  Service: Gastroenterology;;   BILIARY DILATION  06/16/2022   Procedure: GASTROSTOMY DILATION;  Surgeon: Normie Becton., MD;  Location: [facility] ENDOSCOPY;  Service: Gastroenterology;;   BILIARY STENT PLACEMENT N/A 10/10/2020   Procedure: BILIARY STENT PLACEMENT;  Surgeon: Ruby Corporal, MD;  Location: [facility];  Service: Endoscopy;  Laterality: N/A;   BILIARY STENT PLACEMENT  11/09/2020   Procedure: BILIARY STENT PLACEMENT;  Surgeon: Brice Campi Albino Alu., MD;  Location: [facility] ENDOSCOPY;  Service: Gastroenterology;;   BILIARY STENT PLACEMENT  11/07/2020   Procedure: BILIARY STENT PLACEMENT;  Surgeon: Normie Becton., MD;  Location: [facility] ENDOSCOPY;  Service: Gastroenterology;;   BILIARY STENT PLACEMENT  11/11/2020   Procedure: BILIARY STENT PLACEMENT;  Surgeon: Normie Becton., MD;  Location: [facility] ENDOSCOPY;  Service: Gastroenterology;;   BILIARY STENT PLACEMENT  11/14/2020   Procedure: BILIARY STENT PLACEMENT;  Surgeon: Normie Becton., MD;  Location: [facility] ENDOSCOPY;  Service: Gastroenterology;;   BILIARY STENT PLACEMENT N/A 01/29/2021   Procedure: BILIARY STENT PLACEMENT;  Surgeon: Normie Becton., MD;  Location: [facility] ENDOSCOPY;  Service: Gastroenterology;  Laterality: N/A;   BILIARY STENT PLACEMENT  06/26/2021   Procedure: BILIARY STENT PLACEMENT;  Surgeon: Brice Campi Albino Alu., MD;  Location: [facility] ENDOSCOPY;  Service: Gastroenterology;;   BILIARY STENT PLACEMENT  07/24/2021    Procedure: BILIARY STENT PLACEMENT;  Surgeon: Normie Becton., MD;  Location: [facility] ENDOSCOPY;  Service: Gastroenterology;;   BILIARY STENT PLACEMENT N/A 09/23/2023   Procedure: INSERTION, STENT, BILE DUCT;  Surgeon: Normie Becton., MD;  Location: [facility] ENDOSCOPY;  Service: Gastroenterology;  Laterality: N/A;   BIOPSY  11/07/2020   Procedure: BIOPSY;  Surgeon: Brice Campi Albino Alu., MD;  Location: [facility] ENDOSCOPY;  Service: Gastroenterology;;   BIOPSY  12/20/2020   Procedure: BIOPSY;  Surgeon: Normie Becton., MD;  Location: [facility]OSCOPY;  Service: Gastroenterology;;   BIOPSY  06/26/2021   Procedure: BIOPSY;  Surgeon: Normie Becton., MD;  Location: [facility] ENDOSCOPY;  Service: Gastroenterology;;   CARPAL TUNNEL RELEASE Left 02/21/2016   Procedure: CARPAL TUNNEL RELEASE;  Surgeon: Darrin Emerald, MD;  Location: [facility];  Service: Orthopedics;  Laterality: Left;   CHOLECYSTECTOMY N/A 10/09/2020   Procedure: LAPAROSCOPIC CHOLECYSTECTOMY;  Surgeon: Awilda Bogus, MD;  Location: [facility];  Service: General;  Laterality: N/A;   CYST ENTEROSTOMY N/A 11/07/2020   Procedure: CYST ENTEROSTOMY;  Surgeon: Normie Becton., MD;  Location: [facility] ENDOSCOPY;  Service: Gastroenterology;  Laterality: N/A;   CYST GASTROSTOMY  11/11/2020   Procedure: CYST NECROSECTOMY;  Surgeon: Brice Campi Albino Alu., MD;  Location: [facility] ENDOSCOPY;  Service: Gastroenterology;;   CYST GASTROSTOMY  11/14/2020   Procedure: CYST NECROSECTOMY;  Surgeon: Normie Becton., MD;  Location: [facility] ENDOSCOPY;  Service: Gastroenterology;;   CYST GASTROSTOMY  06/16/2022   Procedure: CYST GASTROSTOMY;  Surgeon: Normie Becton., MD;  Location: [facility] ENDOSCOPY;  Service: Gastroenterology;;   CYST REMOVAL HAND     CYSTOSCOPY  06/26/2021   Procedure: CYSTOGASTROSTOMY;  Surgeon: Mansouraty, Albino Alu., MD;  Location: [facility] ENDOSCOPY;  Service: Gastroenterology;;   ENDOSCOPIC RETROGRADE CHOLANGIOPANCREATOGRAPHY  (ERCP) WITH PROPOFOL  N/A 11/09/2020   Procedure: ENDOSCOPIC RETROGRADE CHOLANGIOPANCREATOGRAPHY (ERCP) WITH PROPOFOL ;  Surgeon: Normie Becton., MD;  Location: [facility] ENDOSCOPY;  Service: Gastroenterology;  Laterality: N/A;   ENDOSCOPIC RETROGRADE CHOLANGIOPANCREATOGRAPHY (ERCP) WITH PROPOFOL  N/A 12/20/2020   Procedure: ENDOSCOPIC RETROGRADE CHOLANGIOPANCREATOGRAPHY (ERCP) WITH PROPOFOL ;  Surgeon: Brice Campi Albino Alu., MD;  Location: [facility] ENDOSCOPY;  Service: Gastroenterology;  Laterality: N/A;   ENDOSCOPIC RETROGRADE CHOLANGIOPANCREATOGRAPHY (ERCP) WITH PROPOFOL  N/A 01/29/2021   Procedure: ENDOSCOPIC RETROGRADE CHOLANGIOPANCREATOGRAPHY (ERCP) WITH PROPOFOL ;  Surgeon: Brice Campi Albino Alu., MD;  Location: [facility] ENDOSCOPY;  Service: Gastroenterology;  Laterality: N/A;   ENDOSCOPIC RETROGRADE CHOLANGIOPANCREATOGRAPHY (ERCP) WITH PROPOFOL  N/A 07/24/2021   Procedure: ENDOSCOPIC RETROGRADE CHOLANGIOPANCREATOGRAPHY (ERCP) WITH PROPOFOL ;  Surgeon: Brice Campi Albino Alu., MD;  Location: [facility] ENDOSCOPY;  Service: Gastroenterology;  Laterality: N/A;   ENDOSCOPIC RETROGRADE CHOLANGIOPANCREATOGRAPHY (ERCP) WITH PROPOFOL  N/A 09/23/2023   Procedure: ENDOSCOPIC RETROGRADE CHOLANGIOPANCREATOGRAPHY (ERCP) WITH PROPOFOL ;  Surgeon: Brice Campi Albino Alu., MD;  Location: [facility] ENDOSCOPY;  Service: Gastroenterology;  Laterality: N/A;   ERCP N/A 10/10/2020   Procedure: ENDOSCOPIC RETROGRADE CHOLANGIOPANCREATOGRAPHY (ERCP);  Surgeon: Ruby Corporal, MD;  Location: [facility];  Service: Endoscopy;  Laterality: N/A;   ERCP     ERCP N/A 05/22/2022   Procedure: ENDOSCOPIC RETROGRADE CHOLANGIOPANCREATOGRAPHY (ERCP);  Surgeon: Normie Becton., MD;  Location: [facility]OSCOPY;  Service: Gastroenterology;  Laterality: N/A;   ESOPHAGOGASTRODUODENOSCOPY N/A 11/11/2020   Procedure: ESOPHAGOGASTRODUODENOSCOPY (EGD);  Surgeon: Normie Becton., MD;  Location: [facility] ENDOSCOPY;  Service: Gastroenterology;  Laterality: N/A;    ESOPHAGOGASTRODUODENOSCOPY N/A 11/14/2020   Procedure: ESOPHAGOGASTRODUODENOSCOPY (EGD);  Surgeon: Normie Becton., MD;  Location: [facility] ENDOSCOPY;  Service: Gastroenterology;  Laterality: N/A;   ESOPHAGOGASTRODUODENOSCOPY (EGD) WITH PROPOFOL  N/A 11/09/2020   Procedure: ESOPHAGOGASTRODUODENOSCOPY (EGD) WITH PROPOFOL ;  Surgeon: Brice Campi Albino Alu., MD;  Location: [facility] ENDOSCOPY;  Service: Gastroenterology;  Laterality: N/A;   ESOPHAGOGASTRODUODENOSCOPY (EGD) WITH PROPOFOL  N/A 11/07/2020   Procedure: ESOPHAGOGASTRODUODENOSCOPY (EGD) WITH PROPOFOL ;  Surgeon: Brice Campi Albino Alu., MD;  Location: [facility] ENDOSCOPY;  Service: Gastroenterology;  Laterality: N/A;   ESOPHAGOGASTRODUODENOSCOPY (EGD) WITH PROPOFOL  N/A 12/20/2020   Procedure: ESOPHAGOGASTRODUODENOSCOPY (EGD) WITH PROPOFOL ;  Surgeon: Brice Campi Albino Alu., MD;  Location: [facility] ENDOSCOPY;  Service: Gastroenterology;  Laterality: N/A;   ESOPHAGOGASTRODUODENOSCOPY (EGD) WITH PROPOFOL  N/A 06/26/2021   Procedure: ESOPHAGOGASTRODUODENOSCOPY (EGD) WITH PROPOFOL ;  Surgeon: Brice Campi Albino Alu., MD;  Location: [facility] ENDOSCOPY;  Service: Gastroenterology;  Laterality: N/A;   ESOPHAGOGASTRODUODENOSCOPY (EGD) WITH PROPOFOL  N/A 07/24/2021   Procedure: ESOPHAGOGASTRODUODENOSCOPY (EGD) WITH PROPOFOL ;  Surgeon: Brice Campi Albino Alu., MD;  Location: [facility] ENDOSCOPY;  Service: Gastroenterology;  Laterality: N/A;  AXIOS STENT   ESOPHAGOGASTRODUODENOSCOPY (EGD) WITH PROPOFOL  N/A 05/22/2022   Procedure: ESOPHAGOGASTRODUODENOSCOPY (EGD) WITH PROPOFOL ;  Surgeon: Brice Campi Albino Alu., MD;  Location: [facility] ENDOSCOPY;  Service: Gastroenterology;  Laterality: N/A;   ESOPHAGOGASTRODUODENOSCOPY (EGD) WITH PROPOFOL  N/A 06/16/2022   Procedure: ESOPHAGOGASTRODUODENOSCOPY (EGD) WITH PROPOFOL ;  Surgeon: Brice Campi Albino Alu., MD;  Location: [facility] ENDOSCOPY;  Service: Gastroenterology;  Laterality: N/A;   ESOPHAGOGASTRODUODENOSCOPY (EGD) WITH PROPOFOL  N/A 07/30/2022    Procedure: ESOPHAGOGASTRODUODENOSCOPY (EGD) WITH PROPOFOL ;  Surgeon: Brice Campi Albino Alu., MD;  Location: [facility] ENDOSCOPY;  Service: Gastroenterology;  Laterality: N/A;   ESOPHAGOGASTRODUODENOSCOPY (EGD) WITH PROPOFOL  N/A 03/30/2023   Procedure: ESOPHAGOGASTRODUODENOSCOPY (EGD) WITH PROPOFOL ;  Surgeon: Kenney Peacemaker, MD;  Location: [facility] ENDOSCOPY;  Service: Gastroenterology;  Laterality: N/A;   EUS  11/14/2020   Procedure: UPPER ENDOSCOPIC ULTRASOUND (EUS) LINEAR;  Surgeon: Normie Becton., MD;  Location: [facility] ENDOSCOPY;  Service: Gastroenterology;;   EUS N/A 06/26/2021   Procedure: UPPER ENDOSCOPIC ULTRASOUND (EUS) RADIAL;  Surgeon: Normie Becton., MD;  Location: [facility]  ENDOSCOPY;  Service: Gastroenterology;  Laterality: N/A;   EUS N/A 05/22/2022   Procedure: UPPER ENDOSCOPIC ULTRASOUND (EUS) LINEAR;  Surgeon: Normie Becton., MD;  Location: [facility] ENDOSCOPY;  Service: Gastroenterology;  Laterality: N/A;   EUS N/A 06/16/2022   Procedure: UPPER ENDOSCOPIC ULTRASOUND (EUS) LINEAR;  Surgeon: Normie Becton., MD;  Location: [facility] ENDOSCOPY;  Service: Gastroenterology;  Laterality: N/A;   FINE NEEDLE ASPIRATION  05/22/2022   Procedure: FINE NEEDLE ASPIRATION;  Surgeon: Brice Campi Albino Alu., MD;  Location: [facility]OSCOPY;  Service: Gastroenterology;;   GASTROINTESTINAL STENT REMOVAL  12/20/2020   Procedure: GASTROINTESTINAL STENT REMOVAL;  Surgeon: Normie Becton., MD;  Location: [facility] ENDOSCOPY;  Service: Gastroenterology;;  cyst gastrostomy stent and double pig tail stents x2 removed   HERNIA REPAIR Left 2002   groin   INGUINAL HERNIA REPAIR Left 04/05/2017   Procedure: RECURRENT HERNIA REPAIR INGUINAL ADULT WITH MESH;  Surgeon: Alanda Allegra, MD;  Location: [facility];  Service: General;  Laterality: Left;   MASS EXCISION Right 04/29/2020   Procedure: EXCISION MASS RIGHT WRIST;  Surgeon: Brunilda Capra, MD;  Location: [facility];  Service: Orthopedics;   Laterality: Right;   MASS EXCISION Right 10/09/2020   Procedure: EXCISION MASS, ABDOMINAL WALL, 2CM;  Surgeon: Awilda Bogus, MD;  Location: [facility];  Service: General;  Laterality: Right;   PANCREATIC STENT PLACEMENT  06/26/2021   Procedure: AXIOS STENT PLACEMENT;  Surgeon: Normie Becton., MD;  Location: [facility] ENDOSCOPY;  Service: Gastroenterology;;   PANCREATIC STENT PLACEMENT  06/16/2022   Procedure: PANCREATIC STENT PLACEMENT;  Surgeon: Normie Becton., MD;  Location: [facility]OSCOPY;  Service: Gastroenterology;;   REMOVAL OF STONES  12/20/2020   Procedure: REMOVAL OF STONES;  Surgeon: Normie Becton., MD;  Location: [facility]OSCOPY;  Service: Gastroenterology;;   REMOVAL OF STONES  07/24/2021   Procedure: REMOVAL OF STONES;  Surgeon: Normie Becton., MD;  Location: [facility] ENDOSCOPY;  Service: Gastroenterology;;   REMOVAL OF STONES  05/22/2022   Procedure: REMOVAL OF STONES;  Surgeon: Normie Becton., MD;  Location: [facility];  Service: Gastroenterology;;   [address] N/A 10/10/2020   Procedure: [address];  Surgeon: Ruby Corporal, MD;  Location: [facility];  Service: Endoscopy;  Laterality: N/A;   SPHINCTEROTOMY  07/24/2021   Procedure: SPHINCTEROTOMY;  Surgeon: Mansouraty, Albino Alu., MD;  Location: [facility] ENDOSCOPY;  Service: Gastroenterology;;   SPLENECTOMY, TOTAL N/A 11/17/2022   Procedure: SPLENECTOMY;  Surgeon: Lujean Sake, MD;  Location: [facility] OR;  Service: General;  Laterality: N/A;   SPYGLASS CHOLANGIOSCOPY N/A 12/20/2020   Procedure: [id number] CHOLANGIOSCOPY;  Surgeon: Normie Becton., MD;  Location: [facility] ENDOSCOPY;  Service: Gastroenterology;  Laterality: N/A;   SPYGLASS CHOLANGIOSCOPY N/A 07/24/2021   Procedure: SPYGLASS CHOLANGIOSCOPY;  Surgeon: Normie Becton., MD;  Location: [facility] ENDOSCOPY;  Service: Gastroenterology;  Laterality: N/A;   STENT REMOVAL  11/09/2020   Procedure: STENT REMOVAL;  Surgeon: Normie Becton., MD;  Location: [facility] ENDOSCOPY;  Service: Gastroenterology;;   [place] REMOVAL  11/11/2020   Procedure: STENT REMOVAL;  Surgeon: Normie Becton., MD;  Location: [facility] ENDOSCOPY;  Service: Gastroenterology;;   [place] REMOVAL  11/14/2020   Procedure: STENT REMOVAL;  Surgeon: Normie Becton., MD;  Location: [facility] ENDOSCOPY;  Service: Gastroenterology;;   [place] REMOVAL  12/20/2020   Procedure: STENT REMOVAL;  Surgeon: Normie Becton., MD;  Location: [facility] ENDOSCOPY;  Service: Gastroenterology;;  biliary x2   STENT REMOVAL  07/24/2021   Procedure: AXIOS STENT REMOVAL;  Surgeon: Normie Becton., MD;  Location: [facility] ENDOSCOPY;  Service: Gastroenterology;;   [place] REMOVAL  07/30/2022   Procedure: STENT REMOVAL;  Surgeon: Normie Becton., MD;  Location: [facility];  Service: Gastroenterology;;   UPPER ESOPHAGEAL ENDOSCOPIC ULTRASOUND (EUS) N/A 11/07/2020   Procedure: UPPER ESOPHAGEAL ENDOSCOPIC ULTRASOUND (EUS);  Surgeon: Normie Becton., MD;  Location: [facility] ENDOSCOPY;  Service: Gastroenterology;  Laterality: N/A;   UPPER GASTROINTESTINAL ENDOSCOPY     WOUND DEBRIDEMENT  11/09/2020   Procedure: CYST NECROSECTOMY;  Surgeon: Brice Campi Albino Alu., MD;  Location: [facility] ENDOSCOPY;  Service: Gastroenterology;;    Family History family history includes Diabetes in his father; Heart disease in his father and maternal grandmother; Stroke in his maternal grandfather.  Social History Social History   Socioeconomic History   Marital status: Single    Spouse name: Not on file   Number of children: 0   Years of education: Not on file   Highest education level: GED or equivalent  Occupational History   Not on file  Tobacco Use   Smoking status: Every Day    Current packs/day: 0.25    Average packs/day: 0.3 packs/day for 26.0 years (6.5 ttl pk-yrs)    Types: Cigarettes   Smokeless tobacco: Never   Tobacco comments:    4-5 cig daily as of 10/07/2020.  Vaping Use    Vaping status: Never Used  Substance and Sexual Activity   Alcohol use: No   Drug use: No   Sexual activity: Never    Birth control/protection: None  Other Topics Concern   Not on file  Social History Narrative   Not on file   Social Drivers of Health   Financial Resource Strain: Low Risk  (07/27/2023)   Overall Financial Resource Strain (CARDIA)    Difficulty of Paying Living Expenses: Not very hard  Food Insecurity: No Food Insecurity (08/16/2023)   Hunger Vital Sign    Worried About Running Out of Food in the Last Year: Never true    Ran Out of Food in the Last Year: Never true  Recent Concern: Food Insecurity - Food Insecurity Present (07/27/2023)   Hunger Vital Sign    Worried About Running Out of Food in the Last Year: Never true    Ran Out of Food in the Last Year: Sometimes true  Transportation Needs: No Transportation Needs (08/16/2023)   PRAPARE - Administrator, Civil Service (Medical): No    Lack of Transportation (Non-Medical): No  Physical Activity: Insufficiently Active (07/27/2023)   Exercise Vital Sign    Days of Exercise per Week: 2 days    Minutes of Exercise per Session: 10 min  Stress: No Stress Concern Present (07/27/2023)   [employer] of Occupational Health - Occupational Stress Questionnaire    Feeling of Stress : Only a little  Social Connections: Unknown (07/27/2023)   Social Connection and Isolation Panel [NHANES]    Frequency of Communication with Friends and Family: Three times a week    Frequency of Social Gatherings with Friends and Family: Once a week    Attends Religious Services: Patient declined    Database administrator or Organizations: No    Attends Engineer, structural: Not on file    Marital Status: Divorced  Intimate Partner Violence: Not At Risk (08/16/2023)   Humiliation, Afraid, Rape, and Kick questionnaire    Fear of Current or Ex-Partner: No    Emotionally Abused: No    Physically Abused: No  Sexually  Abused: No    Lab Results  Component Value Date   HGBA1C 10.5 (A) 11/16/2023   HGBA1C 13.7 (H) 09/14/2023   HGBA1C >14.0 (H) 06/08/2023   Lab Results  Component Value Date   CHOL 158 09/14/2023   Lab Results  Component Value Date   HDL 44 09/14/2023   Lab Results  Component Value Date   LDLCALC 98 09/14/2023   Lab Results  Component Value Date   TRIG 72 09/14/2023   Lab Results  Component Value Date   CHOLHDL 3.6 09/14/2023   Lab Results  Component Value Date   CREATININE 0.79 09/14/2023   Lab Results  Component Value Date   GFR 101.48 06/08/2023   Lab Results  Component Value Date   MICROALBUR 0.5 09/14/2023      Component Value Date/Time   NA 136 09/14/2023 0940   NA 135 08/18/2023 1205   K 4.8 09/14/2023 0940   CL 98 09/14/2023 0940   CO2 32 09/14/2023 0940   GLUCOSE 302 (H) 09/14/2023 0940   BUN 16 09/14/2023 0940   BUN 15 08/18/2023 1205   CREATININE 0.79 09/14/2023 0940   CALCIUM  9.7 09/14/2023 0940   PROT 6.7 09/23/2023 1219   PROT 5.7 (L) 08/18/2023 1205   ALBUMIN  3.5 09/23/2023 1219   ALBUMIN  3.7 (L) 08/18/2023 1205   AST 48 (H) 09/23/2023 1219   ALT 76 (H) 09/23/2023 1219   ALKPHOS 347 (H) 09/23/2023 1219   BILITOT 0.3 09/23/2023 1219   BILITOT <0.2 08/18/2023 1205   GFRNONAA >60 08/13/2023 0450   GFRNONAA 89 04/22/2015 0828   GFRAA 85 10/04/2019 1433   GFRAA >89 04/22/2015 0828      Latest Ref Rng & Units 09/14/2023    9:40 AM 08/18/2023   12:05 PM 08/13/2023    4:50 AM  BMP  Glucose 65 - 99 mg/dL 161  096  045   BUN 7 - 25 mg/dL 16  15  18    Creatinine 0.70 - 1.30 mg/dL 4.09  8.11  9.14   BUN/Creat Ratio 6 - 22 (calc) SEE NOTE:  20    Sodium 135 - 146 mmol/L 136  135  132   Potassium 3.5 - 5.3 mmol/L 4.8  4.7  3.5   Chloride 98 - 110 mmol/L 98  95  98   CO2 20 - 32 mmol/L 32  26  26   Calcium  8.6 - 10.3 mg/dL 9.7  9.4  8.4        Component Value Date/Time   WBC 14.2 (H) 08/18/2023 1205   WBC 13.6 (H) 08/12/2023 0257   RBC  4.36 08/18/2023 1205   RBC 4.92 08/12/2023 0257   HGB 13.8 08/18/2023 1205   HCT 41.0 08/18/2023 1205   PLT 484 (H) 08/18/2023 1205   MCV 94 08/18/2023 1205   MCH 31.7 08/18/2023 1205   MCH 31.7 08/12/2023 0257   MCHC 33.7 08/18/2023 1205   MCHC 35.1 08/12/2023 0257   RDW 12.4 08/18/2023 1205   LYMPHSABS 3.7 (H) 08/18/2023 1205   MONOABS 0.7 07/07/2023 1310   EOSABS 0.1 08/18/2023 1205   BASOSABS 0.1 08/18/2023 1205     Parts of this note may have been dictated using voice recognition software. There may be variances in spelling and vocabulary which are unintentional. Not all errors are proofread. Please notify the [facility] if any discrepancies are noted or if the meaning of any statement is not clear.

## 2023-11-16 NOTE — Telephone Encounter (Signed)
 Pt needs PA done for [facility]. Thank.

## 2023-11-16 NOTE — Progress Notes (Signed)
 Patient is here to start the CeQur Simplicity. He did not bring his insulin .  We watched the video, and he was given a booklet.  He was shown how prepare, fill, apply and use the device.  He repeated that each button press delivers 2u of Novolog  insulin  and that he will count by 2 to get to 20u at each meal.  He is

## 2023-11-17 ENCOUNTER — Telehealth: Payer: Self-pay | Admitting: Nutrition

## 2023-11-17 NOTE — Telephone Encounter (Signed)
 LVM to call me to let me know if he was able to fill and apply and use the CeQur,,

## 2023-11-17 NOTE — Patient Instructions (Signed)
 Fill and attach a new CeQur every 4 days Each button press delivers 2 units Call help line if questions or problems with the CeQur.   Read over starter booklet or watch [employer] video again on how to fill and use the device

## 2023-11-22 ENCOUNTER — Other Ambulatory Visit (HOSPITAL_COMMUNITY): Payer: Self-pay

## 2023-11-22 ENCOUNTER — Telehealth: Payer: Self-pay

## 2023-11-22 NOTE — Telephone Encounter (Signed)
 Pharmacy Patient Advocate Encounter   Received notification from Pt Calls Messages that prior authorization for Synjardy  is required/requested.   Insurance verification completed.   The patient is insured through [employer] .   Per test claim: PA required; PA submitted to above mentioned insurance via CoverMyMeds Key/confirmation #/EOC [id number] Status is pending

## 2023-11-23 NOTE — Telephone Encounter (Signed)
 Pharmacy Patient Advocate Encounter  Received notification from [facility] that Prior Authorization for Synjardy  XR Tab 5-1000MG  has been APPROVED from 11-22-2023 to 11-21-2024   PA #/Case ID/Reference #: [id number]

## 2023-11-26 ENCOUNTER — Encounter: Payer: Self-pay | Admitting: Gastroenterology

## 2023-11-26 ENCOUNTER — Ambulatory Visit (INDEPENDENT_AMBULATORY_CARE_PROVIDER_SITE_OTHER): Payer: MEDICAID | Admitting: Gastroenterology

## 2023-11-26 DIAGNOSIS — K8681 Exocrine pancreatic insufficiency: Secondary | ICD-10-CM | POA: Diagnosis not present

## 2023-11-26 DIAGNOSIS — Z8639 Personal history of other endocrine, nutritional and metabolic disease: Secondary | ICD-10-CM

## 2023-11-26 DIAGNOSIS — R748 Abnormal levels of other serum enzymes: Secondary | ICD-10-CM

## 2023-11-26 DIAGNOSIS — G8929 Other chronic pain: Secondary | ICD-10-CM

## 2023-11-26 DIAGNOSIS — K831 Obstruction of bile duct: Secondary | ICD-10-CM

## 2023-11-26 DIAGNOSIS — R109 Unspecified abdominal pain: Secondary | ICD-10-CM

## 2023-11-26 DIAGNOSIS — K859 Acute pancreatitis without necrosis or infection, unspecified: Secondary | ICD-10-CM

## 2023-11-26 DIAGNOSIS — K861 Other chronic pancreatitis: Secondary | ICD-10-CM

## 2023-11-26 DIAGNOSIS — Z9889 Other specified postprocedural states: Secondary | ICD-10-CM

## 2023-11-26 DIAGNOSIS — E1169 Type 2 diabetes mellitus with other specified complication: Secondary | ICD-10-CM

## 2023-11-26 NOTE — Progress Notes (Unsigned)
 GASTROENTEROLOGY OUTPATIENT CLINIC VISIT   Primary Care Provider Vicky Grange Wright, DO [address] [place] [address] [phone]  Patient Profile: TAESHAUN RAMES is a 53 y.o. 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  The patient presents to the [facility] for an evaluation and management of problem(s) noted below:  Problem List No diagnosis found.  Discussed the use of AI scribe software for clinical note transcription with the patient, who gave verbal consent to proceed.  History of Present Illness Please see prior notes for full details of HPI.  Interval History     GI Review of Systems Positive as above Negative for dysphagia, odynophagia, hematochezia, melena  Review of Systems General: Denies fevers/chills Cardiovascular: Denies chest pain Pulmonary: Denies shortness of breath Gastroenterological: See HPI Genitourinary: As per HPI Hematological: Denies easy bruising/bleeding Dermatological: Denies jaundice Psychological: Mood is stable but he is becoming more concerned about the continued weight loss   Medications Current Outpatient Medications  Medication Sig Dispense Refill   Accu-Chek Softclix Lancets lancets Check BS in the morning and at night Dx E11.9 200 each 3   acetaminophen  (TYLENOL ) 500 MG tablet Take 2 tablets (1,000 mg total) by mouth every 8 (eight) hours as needed for mild pain (pain score 1-3).     albuterol  (VENTOLIN  HFA) 108 (90 Base) MCG/ACT inhaler Inhale 2 puffs into the lungs every 6 (six) hours as needed for wheezing or shortness of  breath. 8 g 0   aspirin  EC 81 MG tablet Take 81-162 mg by mouth daily as needed (for pain). Swallow whole.     Continuous Blood Gluc Receiver (FREESTYLE LIBRE READER) DEVI 1 Units by Does not apply route daily. UAD to test BGs daily. Dx E11.9 1 each 1   Continuous Glucose Receiver (FREESTYLE LIBRE 3 READER) DEVI 1 Device by Does not apply route continuous. 1 each 0   Continuous Glucose Sensor (FREESTYLE LIBRE 3 PLUS SENSOR) MISC Check BGs continuously. E11.9 Change sensor every 15 days. 6 each 3   Empagliflozin-metFORMIN HCl ER (SYNJARDY  XR) 11-998 MG TB24 Take 1 tablet by mouth 2 (two) times daily. (Patient not taking: Reported on 11/16/2023) 60 tablet 1   esomeprazole  (NEXIUM ) 20 MG capsule Take 1 capsule (20 mg total) by mouth daily. 30 capsule 1   Glucagon  (BAQSIMI  ONE PACK) 3 MG/DOSE POWD Place 1 Device into the nose as needed (Low blood sugar with impaired consciousness). 2 each 3   glucose blood (ACCU-CHEK GUIDE TEST) test strip Check BS in the morning and at night Dx E11.9 200 each 3   Insulin  Pen Needle (BD PEN NEEDLE NANO 2ND GEN) 32G X 4 MM MISC UAD to administer insulin  Dx E11.10 100 each 3   lipase/protease/amylase (CREON ) 36000 UNITS CPEP capsule Take 3 capsules with meals and 1-2  capsule with snacks ( up too 2 snacks ) (Patient taking differently: Take 36,000-108,000 Units by mouth See admin instructions. Take 108,000-144,000 units by mouth with each meal eaten and 72,000 units with each snack) 570 capsule 2   naloxone  (NARCAN ) nasal spray 4 mg/0.1 mL Place 1 spray into the nose as directed.     NOVOLOG  RELION 100 UNIT/ML injection Inject 20 Units into the skin See admin instructions. Inject 15 units  into the skin three times a day with meals and 5-10 units as needed with snacks, depending on BGL reading     ondansetron  (ZOFRAN ) 4 MG tablet Take 4 mg by mouth every 6 (six) hours as needed for nausea or vomiting.     Oxycodone  HCl 10 MG TABS Take 1 tablet (10 mg total) by mouth every 6  (six) hours as needed (for pain). To last 3 months 90 tablet 0   rosuvastatin  (CRESTOR ) 10 MG tablet Take 1 tablet (10 mg total) by mouth at bedtime. If you experience muscle aches take every other day 90 tablet 1   SEMGLEE , YFGN, 100 UNIT/ML Pen Inject 50 Units into the skin 2 (two) times daily.     No current facility-administered medications for this visit.    Allergies Allergies  Allergen Reactions   Desipramine Nausea Only and Rash    Histories Past Medical History:  Diagnosis Date   Acute pancreatitis without necrosis or infection, unspecified    AKI (acute kidney injury) (HCC) 05/17/2022   Anxiety 10/2014   Ascites    Bipolar disorder (HCC) age 35   COPD (chronic obstructive pulmonary disease) (HCC) 2016   Depression age 27   Enlarged prostate    Hyperlipidemia 2016   Hypertension 2013   Nausea vomiting and diarrhea 06/15/2022   Schizoaffective disorder (HCC) 11/13/2014   Sleep apnea    Does not wear c-pap, sleeps in sitting up position per pt   Thyroid  disease 11/2014   Past Surgical History:  Procedure Laterality Date   BALLOON DILATION N/A 11/07/2020   Procedure: BALLOON DILATION;  Surgeon: Normie Becton., MD;  Location: [facility] ENDOSCOPY;  Service: Gastroenterology;  Laterality: N/A;   BALLOON DILATION N/A 06/26/2021   Procedure: BALLOON DILATION;  Surgeon: Brice Campi Albino Alu., MD;  Location: [facility] ENDOSCOPY;  Service: Gastroenterology;  Laterality: N/A;   BILIARY BRUSHING  09/23/2023   Procedure: BRUSH BIOPSY, BILE DUCT;  Surgeon: Brice Campi Albino Alu., MD;  Location: [facility] ENDOSCOPY;  Service: Gastroenterology;;   BILIARY DILATION  07/24/2021   Procedure: BILIARY DILATION;  Surgeon: Normie Becton., MD;  Location: [facility] ENDOSCOPY;  Service: Gastroenterology;;   BILIARY DILATION  05/22/2022   Procedure: BILIARY DILATION;  Surgeon: Normie Becton., MD;  Location: [facility]OSCOPY;  Service: Gastroenterology;;   BILIARY DILATION  06/16/2022    Procedure: GASTROSTOMY DILATION;  Surgeon: Normie Becton., MD;  Location: [facility] ENDOSCOPY;  Service: Gastroenterology;;   BILIARY STENT PLACEMENT N/A 10/10/2020   Procedure: BILIARY STENT PLACEMENT;  Surgeon: Ruby Corporal, MD;  Location: [facility];  Service: Endoscopy;  Laterality: N/A;   BILIARY STENT PLACEMENT  11/09/2020   Procedure: BILIARY STENT PLACEMENT;  Surgeon: Brice Campi Albino Alu., MD;  Location: [facility] ENDOSCOPY;  Service: Gastroenterology;;   BILIARY STENT PLACEMENT  11/07/2020   Procedure: BILIARY STENT PLACEMENT;  Surgeon: Normie Becton., MD;  Location: [facility] ENDOSCOPY;  Service: Gastroenterology;;   BILIARY STENT PLACEMENT  11/11/2020   Procedure: BILIARY STENT PLACEMENT;  Surgeon: Normie Becton., MD;  Location: [facility] ENDOSCOPY;  Service: Gastroenterology;;   BILIARY STENT PLACEMENT  11/14/2020   Procedure: BILIARY STENT PLACEMENT;  Surgeon: Normie Becton., MD;  Location: [facility] ENDOSCOPY;  Service: Gastroenterology;;   BILIARY STENT PLACEMENT N/A 01/29/2021   Procedure: BILIARY STENT PLACEMENT;  Surgeon: Normie Becton., MD;  Location: [facility]OSCOPY;  Service: Gastroenterology;  Laterality: N/A;   BILIARY STENT PLACEMENT  06/26/2021   Procedure: BILIARY STENT PLACEMENT;  Surgeon: Brice Campi Albino Alu., MD;  Location:  [facility] ENDOSCOPY;  Service: Gastroenterology;;   BILIARY STENT PLACEMENT  07/24/2021   Procedure: BILIARY STENT PLACEMENT;  Surgeon: Normie Becton., MD;  Location: [facility] ENDOSCOPY;  Service: Gastroenterology;;   BILIARY STENT PLACEMENT N/A 09/23/2023   Procedure: INSERTION, STENT, BILE DUCT;  Surgeon: Normie Becton., MD;  Location: [facility] ENDOSCOPY;  Service: Gastroenterology;  Laterality: N/A;   BIOPSY  11/07/2020   Procedure: BIOPSY;  Surgeon: Brice Campi Albino Alu., MD;  Location: [facility] ENDOSCOPY;  Service: Gastroenterology;;   BIOPSY  12/20/2020   Procedure: BIOPSY;  Surgeon: Normie Becton., MD;  Location: [facility]  ENDOSCOPY;  Service: Gastroenterology;;   BIOPSY  06/26/2021   Procedure: BIOPSY;  Surgeon: Normie Becton., MD;  Location: [facility] ENDOSCOPY;  Service: Gastroenterology;;   CARPAL TUNNEL RELEASE Left 02/21/2016   Procedure: CARPAL TUNNEL RELEASE;  Surgeon: Darrin Emerald, MD;  Location: [facility];  Service: Orthopedics;  Laterality: Left;   CHOLECYSTECTOMY N/A 10/09/2020   Procedure: LAPAROSCOPIC CHOLECYSTECTOMY;  Surgeon: Awilda Bogus, MD;  Location: [facility];  Service: General;  Laterality: N/A;   CYST ENTEROSTOMY N/A 11/07/2020   Procedure: CYST ENTEROSTOMY;  Surgeon: Normie Becton., MD;  Location: [facility] ENDOSCOPY;  Service: Gastroenterology;  Laterality: N/A;   CYST GASTROSTOMY  11/11/2020   Procedure: CYST NECROSECTOMY;  Surgeon: Brice Campi Albino Alu., MD;  Location: [facility] ENDOSCOPY;  Service: Gastroenterology;;   CYST GASTROSTOMY  11/14/2020   Procedure: CYST NECROSECTOMY;  Surgeon: Normie Becton., MD;  Location: [facility] ENDOSCOPY;  Service: Gastroenterology;;   CYST GASTROSTOMY  06/16/2022   Procedure: CYST GASTROSTOMY;  Surgeon: Normie Becton., MD;  Location: [facility] ENDOSCOPY;  Service: Gastroenterology;;   CYST REMOVAL HAND     CYSTOSCOPY  06/26/2021   Procedure: CYSTOGASTROSTOMY;  Surgeon: Mansouraty, Albino Alu., MD;  Location: [facility] ENDOSCOPY;  Service: Gastroenterology;;   ENDOSCOPIC RETROGRADE CHOLANGIOPANCREATOGRAPHY (ERCP) WITH PROPOFOL  N/A 11/09/2020   Procedure: ENDOSCOPIC RETROGRADE CHOLANGIOPANCREATOGRAPHY (ERCP) WITH PROPOFOL ;  Surgeon: Normie Becton., MD;  Location: [facility] ENDOSCOPY;  Service: Gastroenterology;  Laterality: N/A;   ENDOSCOPIC RETROGRADE CHOLANGIOPANCREATOGRAPHY (ERCP) WITH PROPOFOL  N/A 12/20/2020   Procedure: ENDOSCOPIC RETROGRADE CHOLANGIOPANCREATOGRAPHY (ERCP) WITH PROPOFOL ;  Surgeon: Brice Campi Albino Alu., MD;  Location: [facility] ENDOSCOPY;  Service: Gastroenterology;  Laterality: N/A;   ENDOSCOPIC RETROGRADE CHOLANGIOPANCREATOGRAPHY  (ERCP) WITH PROPOFOL  N/A 01/29/2021   Procedure: ENDOSCOPIC RETROGRADE CHOLANGIOPANCREATOGRAPHY (ERCP) WITH PROPOFOL ;  Surgeon: Brice Campi Albino Alu., MD;  Location: [facility] ENDOSCOPY;  Service: Gastroenterology;  Laterality: N/A;   ENDOSCOPIC RETROGRADE CHOLANGIOPANCREATOGRAPHY (ERCP) WITH PROPOFOL  N/A 07/24/2021   Procedure: ENDOSCOPIC RETROGRADE CHOLANGIOPANCREATOGRAPHY (ERCP) WITH PROPOFOL ;  Surgeon: Brice Campi Albino Alu., MD;  Location: [facility] ENDOSCOPY;  Service: Gastroenterology;  Laterality: N/A;   ENDOSCOPIC RETROGRADE CHOLANGIOPANCREATOGRAPHY (ERCP) WITH PROPOFOL  N/A 09/23/2023   Procedure: ENDOSCOPIC RETROGRADE CHOLANGIOPANCREATOGRAPHY (ERCP) WITH PROPOFOL ;  Surgeon: Brice Campi Albino Alu., MD;  Location: [facility] ENDOSCOPY;  Service: Gastroenterology;  Laterality: N/A;   ERCP N/A 10/10/2020   Procedure: ENDOSCOPIC RETROGRADE CHOLANGIOPANCREATOGRAPHY (ERCP);  Surgeon: Ruby Corporal, MD;  Location: [facility];  Service: Endoscopy;  Laterality: N/A;   ERCP     ERCP N/A 05/22/2022   Procedure: ENDOSCOPIC RETROGRADE CHOLANGIOPANCREATOGRAPHY (ERCP);  Surgeon: Normie Becton., MD;  Location: [facility]OSCOPY;  Service: Gastroenterology;  Laterality: N/A;   ESOPHAGOGASTRODUODENOSCOPY N/A 11/11/2020   Procedure: ESOPHAGOGASTRODUODENOSCOPY (EGD);  Surgeon: Normie Becton., MD;  Location: [facility] ENDOSCOPY;  Service: Gastroenterology;  Laterality: N/A;   ESOPHAGOGASTRODUODENOSCOPY N/A 11/14/2020   Procedure: ESOPHAGOGASTRODUODENOSCOPY (EGD);  Surgeon: Normie Becton., MD;  Location: [facility] ENDOSCOPY;  Service: Gastroenterology;  Laterality: N/A;  ESOPHAGOGASTRODUODENOSCOPY (EGD) WITH PROPOFOL  N/A 11/09/2020   Procedure: ESOPHAGOGASTRODUODENOSCOPY (EGD) WITH PROPOFOL ;  Surgeon: Brice Campi Albino Alu., MD;  Location: [facility] ENDOSCOPY;  Service: Gastroenterology;  Laterality: N/A;   ESOPHAGOGASTRODUODENOSCOPY (EGD) WITH PROPOFOL  N/A 11/07/2020   Procedure: ESOPHAGOGASTRODUODENOSCOPY (EGD) WITH PROPOFOL ;   Surgeon: Brice Campi Albino Alu., MD;  Location: [facility] ENDOSCOPY;  Service: Gastroenterology;  Laterality: N/A;   ESOPHAGOGASTRODUODENOSCOPY (EGD) WITH PROPOFOL  N/A 12/20/2020   Procedure: ESOPHAGOGASTRODUODENOSCOPY (EGD) WITH PROPOFOL ;  Surgeon: Brice Campi Albino Alu., MD;  Location: [facility] ENDOSCOPY;  Service: Gastroenterology;  Laterality: N/A;   ESOPHAGOGASTRODUODENOSCOPY (EGD) WITH PROPOFOL  N/A 06/26/2021   Procedure: ESOPHAGOGASTRODUODENOSCOPY (EGD) WITH PROPOFOL ;  Surgeon: Brice Campi Albino Alu., MD;  Location: [facility] ENDOSCOPY;  Service: Gastroenterology;  Laterality: N/A;   ESOPHAGOGASTRODUODENOSCOPY (EGD) WITH PROPOFOL  N/A 07/24/2021   Procedure: ESOPHAGOGASTRODUODENOSCOPY (EGD) WITH PROPOFOL ;  Surgeon: Brice Campi Albino Alu., MD;  Location: [facility] ENDOSCOPY;  Service: Gastroenterology;  Laterality: N/A;  AXIOS STENT   ESOPHAGOGASTRODUODENOSCOPY (EGD) WITH PROPOFOL  N/A 05/22/2022   Procedure: ESOPHAGOGASTRODUODENOSCOPY (EGD) WITH PROPOFOL ;  Surgeon: Brice Campi Albino Alu., MD;  Location: [facility] ENDOSCOPY;  Service: Gastroenterology;  Laterality: N/A;   ESOPHAGOGASTRODUODENOSCOPY (EGD) WITH PROPOFOL  N/A 06/16/2022   Procedure: ESOPHAGOGASTRODUODENOSCOPY (EGD) WITH PROPOFOL ;  Surgeon: Brice Campi Albino Alu., MD;  Location: [facility] ENDOSCOPY;  Service: Gastroenterology;  Laterality: N/A;   ESOPHAGOGASTRODUODENOSCOPY (EGD) WITH PROPOFOL  N/A 07/30/2022   Procedure: ESOPHAGOGASTRODUODENOSCOPY (EGD) WITH PROPOFOL ;  Surgeon: Brice Campi Albino Alu., MD;  Location: [facility] ENDOSCOPY;  Service: Gastroenterology;  Laterality: N/A;   ESOPHAGOGASTRODUODENOSCOPY (EGD) WITH PROPOFOL  N/A 03/30/2023   Procedure: ESOPHAGOGASTRODUODENOSCOPY (EGD) WITH PROPOFOL ;  Surgeon: Kenney Peacemaker, MD;  Location: [facility] ENDOSCOPY;  Service: Gastroenterology;  Laterality: N/A;   EUS  11/14/2020   Procedure: UPPER ENDOSCOPIC ULTRASOUND (EUS) LINEAR;  Surgeon: Normie Becton., MD;  Location: [facility] ENDOSCOPY;  Service: Gastroenterology;;   EUS  N/A 06/26/2021   Procedure: UPPER ENDOSCOPIC ULTRASOUND (EUS) RADIAL;  Surgeon: Normie Becton., MD;  Location: [facility] ENDOSCOPY;  Service: Gastroenterology;  Laterality: N/A;   EUS N/A 05/22/2022   Procedure: UPPER ENDOSCOPIC ULTRASOUND (EUS) LINEAR;  Surgeon: Normie Becton., MD;  Location: [facility] ENDOSCOPY;  Service: Gastroenterology;  Laterality: N/A;   EUS N/A 06/16/2022   Procedure: UPPER ENDOSCOPIC ULTRASOUND (EUS) LINEAR;  Surgeon: Normie Becton., MD;  Location: [facility] ENDOSCOPY;  Service: Gastroenterology;  Laterality: N/A;   FINE NEEDLE ASPIRATION  05/22/2022   Procedure: FINE NEEDLE ASPIRATION;  Surgeon: Brice Campi Albino Alu., MD;  Location: [facility];  Service: Gastroenterology;;   GASTROINTESTINAL STENT REMOVAL  12/20/2020   Procedure: GASTROINTESTINAL STENT REMOVAL;  Surgeon: Normie Becton., MD;  Location: [facility] ENDOSCOPY;  Service: Gastroenterology;;  cyst gastrostomy stent and double pig tail stents x2 removed   HERNIA REPAIR Left 2002   groin   INGUINAL HERNIA REPAIR Left 04/05/2017   Procedure: RECURRENT HERNIA REPAIR INGUINAL ADULT WITH MESH;  Surgeon: Alanda Allegra, MD;  Location: [facility];  Service: General;  Laterality: Left;   MASS EXCISION Right 04/29/2020   Procedure: EXCISION MASS RIGHT WRIST;  Surgeon: Brunilda Capra, MD;  Location: [facility];  Service: Orthopedics;  Laterality: Right;   MASS EXCISION Right 10/09/2020   Procedure: EXCISION MASS, ABDOMINAL WALL, 2CM;  Surgeon: Awilda Bogus, MD;  Location: [facility];  Service: General;  Laterality: Right;   PANCREATIC STENT PLACEMENT  06/26/2021   Procedure: AXIOS STENT PLACEMENT;  Surgeon: Normie Becton., MD;  Location: [facility] ENDOSCOPY;  Service: Gastroenterology;;   PANCREATIC STENT PLACEMENT  06/16/2022   Procedure: PANCREATIC STENT PLACEMENT;  Surgeon:  Mansouraty, Albino Alu., MD;  Location: [facility];  Service: Gastroenterology;;   REMOVAL OF STONES  12/20/2020    Procedure: REMOVAL OF STONES;  Surgeon: Normie Becton., MD;  Location: [facility];  Service: Gastroenterology;;   REMOVAL OF STONES  07/24/2021   Procedure: REMOVAL OF STONES;  Surgeon: Normie Becton., MD;  Location: [facility] ENDOSCOPY;  Service: Gastroenterology;;   REMOVAL OF STONES  05/22/2022   Procedure: REMOVAL OF STONES;  Surgeon: Normie Becton., MD;  Location: [facility];  Service: Gastroenterology;;   [address] N/A 10/10/2020   Procedure: [address];  Surgeon: Ruby Corporal, MD;  Location: [facility];  Service: Endoscopy;  Laterality: N/A;   SPHINCTEROTOMY  07/24/2021   Procedure: SPHINCTEROTOMY;  Surgeon: Mansouraty, Albino Alu., MD;  Location: [facility] ENDOSCOPY;  Service: Gastroenterology;;   SPLENECTOMY, TOTAL N/A 11/17/2022   Procedure: SPLENECTOMY;  Surgeon: Lujean Sake, MD;  Location: [facility] OR;  Service: General;  Laterality: N/A;   SPYGLASS CHOLANGIOSCOPY N/A 12/20/2020   Procedure: ZOXWRUEA CHOLANGIOSCOPY;  Surgeon: Normie Becton., MD;  Location: [facility] END[facility];  Service: Gastroenterology;  Laterality: N/A;   SPYGLASS CHOLANGIOSCOPY N/A 07/24/2021   Procedure: SPYGLASS CHOLANGIOSCOPY;  Surgeon: Normie Becton., MD;  Location: [facility] ENDOSCOPY;  Service: Gastroenterology;  Laterality: N/A;   STENT REMOVAL  11/09/2020   Procedure: STENT REMOVAL;  Surgeon: Normie Becton., MD;  Location: [facility] ENDOSCOPY;  Service: Gastroenterology;;   [place] REMOVAL  11/11/2020   Procedure: STENT REMOVAL;  Surgeon: Normie Becton., MD;  Location: [facility] ENDOSCOPY;  Service: Gastroenterology;;   [place] REMOVAL  11/14/2020   Procedure: STENT REMOVAL;  Surgeon: Normie Becton., MD;  Location: [facility] ENDOSCOPY;  Service: Gastroenterology;;   [place] REMOVAL  12/20/2020   Procedure: STENT REMOVAL;  Surgeon: Normie Becton., MD;  Location: [facility];  Service: Gastroenterology;;  biliary x2   STENT REMOVAL  07/24/2021   Procedure: AXIOS STENT  REMOVAL;  Surgeon: Normie Becton., MD;  Location: [facility] ENDOSCOPY;  Service: Gastroenterology;;   [place] REMOVAL  07/30/2022   Procedure: STENT REMOVAL;  Surgeon: Normie Becton., MD;  Location: [facility] ENDOSCOPY;  Service: Gastroenterology;;   UPPER ESOPHAGEAL ENDOSCOPIC ULTRASOUND (EUS) N/A 11/07/2020   Procedure: UPPER ESOPHAGEAL ENDOSCOPIC ULTRASOUND (EUS);  Surgeon: Normie Becton., MD;  Location: [facility] ENDOSCOPY;  Service: Gastroenterology;  Laterality: N/A;   UPPER GASTROINTESTINAL ENDOSCOPY     WOUND DEBRIDEMENT  11/09/2020   Procedure: CYST NECROSECTOMY;  Surgeon: Mansouraty, Albino Alu., MD;  Location: [facility] ENDOSCOPY;  Service: Gastroenterology;;   Social History   Socioeconomic History   Marital status: Single    Spouse name: Not on file   Number of children: 0   Years of education: Not on file   Highest education level: GED or equivalent  Occupational History   Not on file  Tobacco Use   Smoking status: Every Day    Current packs/day: 0.25    Average packs/day: 0.3 packs/day for 26.0 years (6.5 ttl pk-yrs)    Types: Cigarettes   Smokeless tobacco: Never   Tobacco comments:    4-5 cig daily as of 10/07/2020.  Vaping Use   Vaping status: Never Used  Substance and Sexual Activity   Alcohol use: No   Drug use: No   Sexual activity: Never    Birth control/protection: None  Other Topics Concern   Not on file  Social History Narrative   Not on file   Social Drivers of Health   Financial Resource Strain: Low Risk  (07/27/2023)  Overall Financial Resource Strain (CARDIA)    Difficulty of Paying Living Expenses: Not very hard  Food Insecurity: No Food Insecurity (08/16/2023)   Hunger Vital Sign    Worried About Running Out of Food in the Last Year: Never true    Ran Out of Food in the Last Year: Never true  Recent Concern: Food Insecurity - Food Insecurity Present (07/27/2023)   Hunger Vital Sign    Worried About Running Out of Food in the Last Year: Never  true    Ran Out of Food in the Last Year: Sometimes true  Transportation Needs: No Transportation Needs (08/16/2023)   PRAPARE - Administrator, Civil Service (Medical): No    Lack of Transportation (Non-Medical): No  Physical Activity: Insufficiently Active (07/27/2023)   Exercise Vital Sign    Days of Exercise per Week: 2 days    Minutes of Exercise per Session: 10 min  Stress: No Stress Concern Present (07/27/2023)   [employer] of Occupational Health - Occupational Stress Questionnaire    Feeling of Stress : Only a little  Social Connections: Unknown (07/27/2023)   Social Connection and Isolation Panel [NHANES]    Frequency of Communication with Friends and Family: Three times a week    Frequency of Social Gatherings with Friends and Family: Once a week    Attends Religious Services: Patient declined    Database administrator or Organizations: No    Attends Engineer, structural: Not on file    Marital Status: Divorced  Intimate Partner Violence: Not At Risk (08/16/2023)   Humiliation, Afraid, Rape, and Kick questionnaire    Fear of Current or Ex-Partner: No    Emotionally Abused: No    Physically Abused: No    Sexually Abused: No   Family History  Problem Relation Age of Onset   Diabetes Father    Heart disease Father    Heart disease Maternal Grandmother    Stroke Maternal Grandfather    Colon cancer Neg Hx    Esophageal cancer Neg Hx    Inflammatory bowel disease Neg Hx    Liver disease Neg Hx    Pancreatic cancer Neg Hx    Rectal cancer Neg Hx    Stomach cancer Neg Hx    I have reviewed his medical, social, and family history in detail and updated the electronic medical record as necessary.    PHYSICAL EXAMINATION  There were no vitals taken for this visit. Wt Readings from Last 3 Encounters:  11/16/23 170 lb (77.1 kg)  11/09/23 175 lb (79.4 kg)  09/27/23 165 lb 6.4 oz (75 kg)  GEN: NAD, appears stated age, doesn't appear chronically ill,  sister with him PSYCH: Cooperative, without pressured speech EYE: Sclera anicteric ENT: MMM CV: Nontachycardic RESP: No audible wheezing GI: NABS, soft, protuberant abdomen, mild TTP in upper abdomen, no rebound, no guarding MSK/EXT: No significant lower extremity edema SKIN: No jaundice NEURO:  Alert & Oriented x 3, no focal deficits   REVIEW OF DATA  I reviewed the following data at the time of this encounter:  GI Procedures and Studies  No new studies to review  Laboratory Studies  Reviewed those in epic  Imaging Studies  11/24 MRI/MRCP IMPRESSION: 1. Examination is generally limited by breath motion artifact, particularly multiphasic contrast enhanced sequences. 2. Within this limitation, unchanged appearance of the pancreas status post distal pancreatectomy. Remnant pancreatic tissue in the pancreatic head and a portion of the pancreatic tail is unchanged.  3. Unchanged fluid signal cystic lesion adjacent to the pancreatic tail and greater curvature of the stomach measuring 3.5 x 2.9 cm and within the pancreatic tail remnant measuring 1.6 x 1.2 cm. Additional small unchanged fluid signal cystic lesion in the pancreatic head measuring 0.8 x 0.7 cm. These are consistent with pseudocysts in this clinical setting. 4. No acute inflammatory findings. 5. Status post cholecystectomy. Unchanged biliary ductal dilatation. The central common bile duct is again effaced within the pancreatic head most likely secondary to chronic sequelae of pancreatitis. No choledocholithiasis or obvious obstructing mass. Consider ERCP to assess for functional obstruction given report of abnormal LFTs. 6. Status post splenectomy.  9/24 CTAP IMPRESSION: 1. Evidence of patient's previous partial pancreatectomy and splenectomy. Residual pancreatic tissue over the pancreatic head and also over the expected distal body of the pancreas. Persistent fluid and mild fat stranding over the pancreatic  surgical bed which may be slightly worse and may represent active pancreatitis. New 3.3 cm oval cystic structure abutting the greater curvature of the stomach. Slight interval decrease in size of a 1.9 x 2.1 cm density abutting the gastric fundus. These findings may represent persistent pancreatic pseudocysts versus other infected versus noninfected postsurgical collections. 2. Mild mucosal enhancement and wall thickening involving the duodenum and very proximal jejunum which may be secondary to adjacent pancreatic inflammatory process. 3. Previous cholecystectomy with stable dilatation of the common bile duct and central intrahepatic ducts. 4. Mild fecal retention throughout the colon. 5. Suggestion of mild emphysematous disease.   ASSESSMENT  Mr. Mcclenahan is a 53 y.o. 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  The patient is seen today for evaluation and management of:  No diagnosis found.  The patient is hemodynamically stable.  From a GI perspective, he is stable though things could persist or progress over the course of the next few weeks or few months.  Unfortunately he has had such a significant issue after his post ERCP pancreatitis a few years ago done by an outside provider after his gallbladder was removed.  With continued issues of pseudocyst recurrence and PD disruption, partial pancreatectomy performed earlier this year.  Surgically has been doing okay.  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  His insulin -dependent diabetes is not  well-controlled, and I think his continued unintentional weight loss, polydipsia, fatigue is a result of his issues of his diabetes at this time.  Although he continues to work with his primary care provider, I think it is reasonable, based on the blood sugars that he is discussing with me today that he likely needs an endocrinologist as well and we will place a referral today.  From a GI perspective, we need to continue to monitor his liver biochemical testing, at some point he could redevelop enough biliary narrowing from the pancreatitis perspective, that a repeat biliary stent will need to be placed (she has had issues with this in the past that have been required as the sent to [facility] to try to remove although the more recent one I was able to remove.  Time will tell.  Will see what his repeat labs look like also to ensure that from an electrolyte standpoint he is okay.  Until his diabetes is further  controlled, I think he will continue to have progressive symptoms and unintentional weight loss.  Okay for pain management and short course of oxycodone  as he is not required any of this in over 6 weeks and I will give this for now, but not clear that I will be prescribing this in the long-term.  He will continue PERT therapy for now.  All patient questions were answered to the best of my ability, and the patient agrees to the aforementioned plan of action with follow-up as indicated.   PLAN  Plan for CT abdomen pancreas protocol in 69-month interval from last imaging study in November Continue Creon  - 3 capsules with each meal - 1 capsule with each snack Continue daily PPI Low-fat diet Tobacco cessation recommended again Short course oxycodone  10 mg every 6 hours as needed (30/0) He will work with PCP for continued medication therapy with opioids if needed otherwise I am willing to keep tramadol  medication to be used but will not be prescribing oxycodone  long-term Patient to follow-up with PCP in regards  to diabetes control Endocrinology referral placed Laboratory 7 below   No orders of the defined types were placed in this encounter.   New Prescriptions   No medications on file    Planned Follow Up No follow-ups on file.   Total Time in Face-to-Face and in Coordination of Care for patient including independent/personal interpretation/review of prior testing, medical history, examination, medication adjustment, communicating results with the patient directly, and documentation with the EHR is 30 minutes.   Yong Henle, MD [facility] Office # [phone]

## 2023-11-26 NOTE — Patient Instructions (Addendum)
 Your provider has requested that you go to the basement level for lab work before leaving today. Press "B" on the elevator. The lab is located at the first door on the left as you exit the elevator.   Your provider has requested that you have an abdominal x ray on Tuesday 11/30/23. Please go to the basement floor to our Radiology department for the test.  We are referring you to Dr Kathyanne Parkers [facility] .  They will contact you directly to schedule an appointment.  It may take a week or more before you hear from them.  Please feel free to contact us  if you have not heard from them within 2 weeks and we will follow up on the referral.   You will be scheduled for ERCP in September 2025. If you have not heard from office by August please call office to schedule.  _______________________________________________________  If your blood pressure at your visit was 140/90 or greater, please contact your primary care physician to follow up on this.  _______________________________________________________  If you are age 76 or older, your body mass index should be between 23-30. Your Body mass index is 24.45 kg/m. If this is out of the aforementioned range listed, please consider follow up with your Primary Care Provider.  If you are age 82 or younger, your body mass index should be between 19-25. Your Body mass index is 24.45 kg/m. If this is out of the aformentioned range listed, please consider follow up with your Primary Care Provider.   ________________________________________________________  The [facility] providers would like to encourage you to use MYCHART to communicate with providers for non-urgent requests or questions.  Due to long hold times on the telephone, sending your provider a message by [facility] may be a faster and more efficient way to get a response.  Please allow 48 business hours for a response.  Please remember that this is for non-urgent requests.   _______________________________________________________  Due to recent changes in healthcare laws, you may see the results of your imaging and laboratory studies on MyChart before your provider has had a chance to review them.  We understand that in some cases there may be results that are confusing or concerning to you. Not all laboratory results come back in the same time frame and the provider may be waiting for multiple results in order to interpret others.  Please give us  48 hours in order for your provider to thoroughly review all the results before contacting the office for clarification of your results.   Thank you for choosing me and [facility].  Dr. Brice Campi

## 2023-11-29 ENCOUNTER — Encounter: Payer: Self-pay | Admitting: Gastroenterology

## 2023-11-29 DIAGNOSIS — Z8639 Personal history of other endocrine, nutritional and metabolic disease: Secondary | ICD-10-CM | POA: Insufficient documentation

## 2023-11-29 DIAGNOSIS — Z9889 Other specified postprocedural states: Secondary | ICD-10-CM | POA: Insufficient documentation

## 2023-11-29 DIAGNOSIS — G8929 Other chronic pain: Secondary | ICD-10-CM | POA: Insufficient documentation

## 2023-12-02 ENCOUNTER — Other Ambulatory Visit (INDEPENDENT_AMBULATORY_CARE_PROVIDER_SITE_OTHER): Payer: MEDICAID

## 2023-12-02 ENCOUNTER — Ambulatory Visit
Admission: RE | Admit: 2023-12-02 | Discharge: 2023-12-02 | Disposition: A | Discharge status: Home / Self Care | Payer: MEDICAID | Source: Ambulatory Visit | Attending: Gastroenterology | Admitting: Gastroenterology

## 2023-12-02 DIAGNOSIS — K831 Obstruction of bile duct: Secondary | ICD-10-CM | POA: Diagnosis not present

## 2023-12-02 DIAGNOSIS — K861 Other chronic pancreatitis: Secondary | ICD-10-CM | POA: Diagnosis not present

## 2023-12-02 DIAGNOSIS — K859 Acute pancreatitis without necrosis or infection, unspecified: Secondary | ICD-10-CM | POA: Diagnosis not present

## 2023-12-02 LAB — CBC
HCT: 46.8 % (ref 39.0–52.0)
Hemoglobin: 15.9 g/dL (ref 13.0–17.0)
MCHC: 34.1 g/dL (ref 30.0–36.0)
MCV: 91.4 fl (ref 78.0–100.0)
Platelets: 429 10*3/uL — ABNORMAL HIGH (ref 150.0–400.0)
RBC: 5.12 Mil/uL (ref 4.22–5.81)
RDW: 13.9 % (ref 11.5–15.5)
WBC: 14.1 10*3/uL — ABNORMAL HIGH (ref 4.0–10.5)

## 2023-12-02 LAB — COMPREHENSIVE METABOLIC PANEL WITH GFR
ALT: 29 U/L (ref 0–53)
AST: 19 U/L (ref 0–37)
Albumin: 4.2 g/dL (ref 3.5–5.2)
Alkaline Phosphatase: 130 U/L — ABNORMAL HIGH (ref 39–117)
BUN: 15 mg/dL (ref 6–23)
CO2: 29 meq/L (ref 19–32)
Calcium: 9.2 mg/dL (ref 8.4–10.5)
Chloride: 102 meq/L (ref 96–112)
Creatinine, Ser: 0.86 mg/dL (ref 0.40–1.50)
GFR: 99.32 mL/min (ref >60.00)
Glucose, Bld: 188 mg/dL — ABNORMAL HIGH (ref 70–99)
Potassium: 4.4 meq/L (ref 3.5–5.1)
Sodium: 139 meq/L (ref 135–145)
Total Bilirubin: 0.4 mg/dL (ref 0.2–1.2)
Total Protein: 6.7 g/dL (ref 6.0–8.3)

## 2023-12-02 LAB — LIPASE: Lipase: 91 U/L — ABNORMAL HIGH (ref 11.0–59.0)

## 2023-12-02 LAB — AMYLASE: Amylase: 48 U/L (ref 27–131)

## 2023-12-02 LAB — GAMMA GT: GGT: 25 U/L (ref 7–51)

## 2023-12-03 ENCOUNTER — Ambulatory Visit: Payer: Self-pay | Admitting: Gastroenterology

## 2023-12-15 ENCOUNTER — Encounter: Payer: Self-pay | Admitting: "Endocrinology

## 2023-12-15 ENCOUNTER — Ambulatory Visit (INDEPENDENT_AMBULATORY_CARE_PROVIDER_SITE_OTHER): Payer: MEDICAID | Admitting: "Endocrinology

## 2023-12-15 VITALS — BP 120/80 | HR 92 | Ht 70.0 in | Wt 172.0 lb

## 2023-12-15 DIAGNOSIS — Z794 Long term (current) use of insulin: Secondary | ICD-10-CM | POA: Diagnosis not present

## 2023-12-15 DIAGNOSIS — E7800 Pure hypercholesterolemia, unspecified: Secondary | ICD-10-CM | POA: Diagnosis not present

## 2023-12-15 DIAGNOSIS — E1165 Type 2 diabetes mellitus with hyperglycemia: Secondary | ICD-10-CM

## 2023-12-15 LAB — GLUCOSE, POCT (MANUAL RESULT ENTRY)
POC Glucose: 156 mg/dL — AB (ref 70–99)
POC Glucose: 91 mg/dL (ref 70–99)

## 2023-12-15 MED ORDER — ROSUVASTATIN CALCIUM 10 MG PO TABS: 10.0000 mg | ORAL_TABLET | Freq: Every day | ORAL | 3 refills | Status: AC

## 2023-12-15 NOTE — Progress Notes (Addendum)
 Outpatient Endocrinology Note Steve Newcomer, MD  12/17/23   Steve Romero 08-04-1970 [id number]  Referring Provider: Eliodoro Guerin, DO Primary Care Provider: Eliodoro Guerin, DO Reason for consultation: Subjective   Assessment & Plan  Diagnoses and all orders for this visit:  Uncontrolled type 2 diabetes mellitus with hyperglycemia (HCC) -     POCT glucose (manual entry) -     POCT glucose (manual entry)  Long-term insulin  use (HCC)  Pure hypercholesterolemia  Other orders -     rosuvastatin  (CRESTOR ) 10 MG tablet; Take 1 tablet (10 mg total) by mouth daily.    Diabetes Type II complicated by neuropathy,  Lab Results  Component Value Date   GFR 99.32 12/02/2023   Hba1c goal less than 7, current Hba1c is  Lab Results  Component Value Date   HGBA1C 10.5 (A) 11/16/2023   Will recommend the following: Per patient meter, BG was 44. After 2 4-oz juices, BG  91. On recheck, it went up to 156.  Start synjardy  XR 11/998 mg 1 pill a day, if well-tolerated increase it to twice a day with meals (not opting regular metformin given history of GI issues sometimes): Sending for prior auth per pt  Semglee  25 units bid, increase by 2 units every day if BG>150 Novolog  12 units tid 15 min before meals, increase by 2 units every day if BG>150 Correction scale: Use in addition to your meal time/short acting insulin  based on blood sugars as follows:  151 - 180: 1 unit 181 - 210: 2 units 211 - 240: 3 units 241 - 270: 4 units 271 - 300: 5 units 301 - 330: 6 units 331 - 360: 7 units 361 - 390: 8 units 391 - 420/High : 9 units   11/09/23:Ordered diabetes education that patient got today, pt is drinking sweet drinks all day leading to high sugars, was not taking 20 units but only correction scale, had a low in 10/2023, discussed hypoglycemia management again, with plans to try CeQur for being compliant with short acting insulin  as well as sticking to 1 drink a day instead  of drinking all day the sugary beverages 11/16/23 patient again got diabetes education today.  Patient repeatedly says that he has been doing everything right, taking up to 6 shots of insulin  a: day, 2 of the Semglee  and up to 4 of the NovoLog  15 units with meals.  His blood sugars continue to stay high except rarely knows that patient keeps telling because of his pancreas.  Reinforced compliance and discussed the dangers of continuing dose elevation of Novolog  in case of noncompliance, patient understands and agreed 12/15/23: seen DM educator, using insulin  in scar tissue, drinking juices and mountain dew   No known contraindications/side effects to any of above medications Glucagon  discussed and prescribed with refills on 09/2023  -Last LD and Tg are as follows: Lab Results  Component Value Date   LDLCALC 98 09/14/2023    Lab Results  Component Value Date   TRIG 72 09/14/2023   -stopped rosuvastatin  10 mg every day, reinforced it -Follow low fat diet and exercise   -Blood pressure goal <140/90 - Microalbumin/creatinine goal is < 30 -Last MA/Cr is as follows: Lab Results  Component Value Date   MICROALBUR 0.5 09/14/2023   -not on ACE/ARB  -diet changes including salt restriction -limit eating outside -counseled BP targets per standards of diabetes care -uncontrolled blood pressure can lead to retinopathy, nephropathy and cardiovascular and atherosclerotic heart disease  Reviewed and counseled on: -A1C target -Blood sugar targets -Complications of uncontrolled diabetes  -Checking blood sugar before meals and bedtime and bring log next visit -All medications with mechanism of action and side effects -Hypoglycemia management: rule of 15's, Glucagon  Emergency Kit and medical alert ID -low-carb low-fat plate-method diet -At least 20 minutes of physical activity per day -Annual dilated retinal eye exam and foot exam -compliance and follow up needs -follow up as scheduled or earlier  if problem gets worse  Call if blood sugar is less than 70 or consistently above 250    Take a 15 gm snack of carbohydrate at bedtime before you go to sleep if your blood sugar is less than 100.    If you are going to fast after midnight for a test or procedure, ask your physician for instructions on how to reduce/decrease your insulin  dose.    Call if blood sugar is less than 70 or consistently above 250  -Treating a low sugar by rule of 15  (15 gms of sugar every 15 min until sugar is more than 70) If you feel your sugar is low, test your sugar to be sure If your sugar is low (less than 70), then take 15 grams of a fast acting Carbohydrate (3-4 glucose tablets or glucose gel or 4 ounces of juice or regular soda) Recheck your sugar 15 min after treating low to make sure it is more than 70 If sugar is still less than 70, treat again with 15 grams of carbohydrate          Don't drive the hour of hypoglycemia  If unconscious/unable to eat or drink by mouth, use glucagon  injection or nasal spray baqsimi  and call 911. Can repeat again in 15 min if still unconscious.  Return in about 2 weeks (around 12/29/2023).   I have reviewed current medications, nurse's notes, allergies, vital signs, past medical and surgical history, family medical history, and social history for this encounter. Counseled patient on symptoms, examination findings, lab findings, imaging results, treatment decisions and monitoring and prognosis. The patient understood the recommendations and agrees with the treatment plan. All questions regarding treatment plan were fully answered.  Steve Newcomer, MD  12/17/23    History of Present Illness Steve Romero is a 53 y.o. year old male who presents for follow up of Type II diabetes mellitus.  Home diabetes regimen: Semglee  40 units bid Novolog  15-20 units tid  COMPLICATIONS -  MI/Stroke -  retinopathy +  neuropathy -  nephropathy  SYMPTOMS REVIEWED + Blurred  vision  BLOOD SUGAR DATA  CGM interpretation: At today's visit, we reviewed her CGM downloads. The full report is scanned in the media. Reviewing the CGM trends, BG are elevated across the day with the random lows in between.   Physical Exam  BP 120/80   Pulse 92   Ht 5' 10 (1.778 m)   Wt 172 lb (78 kg)   SpO2 98%   BMI 24.68 kg/m    Constitutional: well developed, well nourished Head: normocephalic, atraumatic Eyes: sclera anicteric, no redness Neck: supple Lungs: normal respiratory effort Neurology: alert and oriented Skin: dry, no appreciable rashes Musculoskeletal: no appreciable defects Psychiatric: normal mood and affect Diabetic Foot Exam - Simple   No data filed      Current Medications Patient's Medications  New Prescriptions   ROSUVASTATIN  (CRESTOR ) 10 MG TABLET    Take 1 tablet (10 mg total) by mouth daily.  Previous Medications   ACCU-CHEK  SOFTCLIX LANCETS LANCETS    Check BS in the morning and at night Dx E11.9   ACETAMINOPHEN  (TYLENOL ) 500 MG TABLET    Take 2 tablets (1,000 mg total) by mouth every 8 (eight) hours as needed for mild pain (pain score 1-3).   ALBUTEROL  (VENTOLIN  HFA) 108 (90 BASE) MCG/ACT INHALER    Inhale 2 puffs into the lungs every 6 (six) hours as needed for wheezing or shortness of breath.   ASPIRIN  EC 81 MG TABLET    Take 81-162 mg by mouth daily as needed (for pain). Swallow whole.   CONTINUOUS BLOOD GLUC RECEIVER (FREESTYLE LIBRE READER) DEVI    1 Units by Does not apply route daily. UAD to test BGs daily. Dx E11.9   CONTINUOUS GLUCOSE RECEIVER (FREESTYLE LIBRE 3 READER) DEVI    1 Device by Does not apply route continuous.   CONTINUOUS GLUCOSE SENSOR (FREESTYLE LIBRE 3 PLUS SENSOR) MISC    Check BGs continuously. E11.9 Change sensor every 15 days.   EMPAGLIFLOZIN-METFORMIN HCL ER (SYNJARDY  XR) 11-998 MG TB24    Take 1 tablet by mouth 2 (two) times daily.   GLUCAGON  (BAQSIMI  ONE PACK) 3 MG/DOSE POWD    Place 1 Device into the nose as  needed (Low blood sugar with impaired consciousness).   GLUCOSE BLOOD (ACCU-CHEK GUIDE TEST) TEST STRIP    Check BS in the morning and at night Dx E11.9   INSULIN  PEN NEEDLE (BD PEN NEEDLE NANO 2ND GEN) 32G X 4 MM MISC    UAD to administer insulin  Dx E11.10   LIPASE/PROTEASE/AMYLASE (CREON ) 36000 UNITS CPEP CAPSULE    Take 3 capsules with meals and 1-2  capsule with snacks ( up too 2 snacks )   NALOXONE  (NARCAN ) NASAL SPRAY 4 MG/0.1 ML    Place 1 spray into the nose as directed.   NOVOLOG  RELION 100 UNIT/ML INJECTION    Inject 20 Units into the skin See admin instructions. Inject 15 units into the skin three times a day with meals and 5-10 units as needed with snacks, depending on BGL reading   ONDANSETRON  (ZOFRAN ) 4 MG TABLET    Take 4 mg by mouth every 6 (six) hours as needed for nausea or vomiting.   OXYCODONE  HCL 10 MG TABS    Take 1 tablet (10 mg total) by mouth every 6 (six) hours as needed (for pain). To last 3 months   SEMGLEE , YFGN, 100 UNIT/ML PEN    Inject 50 Units into the skin 2 (two) times daily.  Modified Medications   No medications on file  Discontinued Medications   No medications on file    Allergies Allergies  Allergen Reactions   Desipramine Nausea Only and Rash    Past Medical History Past Medical History:  Diagnosis Date   Acute pancreatitis without necrosis or infection, unspecified    AKI (acute kidney injury) (HCC) 05/17/2022   Anxiety 10/2014   Ascites    Bipolar disorder (HCC) age 8   COPD (chronic obstructive pulmonary disease) (HCC) 2016   Depression age 41   Enlarged prostate    Hyperlipidemia 2016   Hypertension 2013   Nausea vomiting and diarrhea 06/15/2022   Schizoaffective disorder (HCC) 11/13/2014   Sleep apnea    Does not wear c-pap, sleeps in sitting up position per pt   Thyroid  disease 11/2014    Past Surgical History Past Surgical History:  Procedure Laterality Date   BALLOON DILATION N/A 11/07/2020   Procedure: BALLOON DILATION;   Surgeon:  Mansouraty, Albino Alu., MD;  Location: [facility] ENDOSCOPY;  Service: Gastroenterology;  Laterality: N/A;   BALLOON DILATION N/A 06/26/2021   Procedure: BALLOON DILATION;  Surgeon: Brice Campi Albino Alu., MD;  Location: [facility] ENDOSCOPY;  Service: Gastroenterology;  Laterality: N/A;   BILIARY BRUSHING  09/23/2023   Procedure: BRUSH BIOPSY, BILE DUCT;  Surgeon: Brice Campi Albino Alu., MD;  Location: [facility] ENDOSCOPY;  Service: Gastroenterology;;   BILIARY DILATION  07/24/2021   Procedure: BILIARY DILATION;  Surgeon: Normie Becton., MD;  Location: [facility] ENDOSCOPY;  Service: Gastroenterology;;   BILIARY DILATION  05/22/2022   Procedure: BILIARY DILATION;  Surgeon: Normie Becton., MD;  Location: [facility]OSCOPY;  Service: Gastroenterology;;   BILIARY DILATION  06/16/2022   Procedure: GASTROSTOMY DILATION;  Surgeon: Normie Becton., MD;  Location: [facility] ENDOSCOPY;  Service: Gastroenterology;;   BILIARY STENT PLACEMENT N/A 10/10/2020   Procedure: BILIARY STENT PLACEMENT;  Surgeon: Ruby Corporal, MD;  Location: [facility];  Service: Endoscopy;  Laterality: N/A;   BILIARY STENT PLACEMENT  11/09/2020   Procedure: BILIARY STENT PLACEMENT;  Surgeon: Brice Campi Albino Alu., MD;  Location: [facility] ENDOSCOPY;  Service: Gastroenterology;;   BILIARY STENT PLACEMENT  11/07/2020   Procedure: BILIARY STENT PLACEMENT;  Surgeon: Normie Becton., MD;  Location: [facility] ENDOSCOPY;  Service: Gastroenterology;;   BILIARY STENT PLACEMENT  11/11/2020   Procedure: BILIARY STENT PLACEMENT;  Surgeon: Normie Becton., MD;  Location: [facility] ENDOSCOPY;  Service: Gastroenterology;;   BILIARY STENT PLACEMENT  11/14/2020   Procedure: BILIARY STENT PLACEMENT;  Surgeon: Normie Becton., MD;  Location: [facility] ENDOSCOPY;  Service: Gastroenterology;;   BILIARY STENT PLACEMENT N/A 01/29/2021   Procedure: BILIARY STENT PLACEMENT;  Surgeon: Normie Becton., MD;  Location: [facility] ENDOSCOPY;  Service:  Gastroenterology;  Laterality: N/A;   BILIARY STENT PLACEMENT  06/26/2021   Procedure: BILIARY STENT PLACEMENT;  Surgeon: Brice Campi Albino Alu., MD;  Location: [facility] ENDOSCOPY;  Service: Gastroenterology;;   BILIARY STENT PLACEMENT  07/24/2021   Procedure: BILIARY STENT PLACEMENT;  Surgeon: Normie Becton., MD;  Location: [facility] ENDOSCOPY;  Service: Gastroenterology;;   BILIARY STENT PLACEMENT N/A 09/23/2023   Procedure: INSERTION, STENT, BILE DUCT;  Surgeon: Normie Becton., MD;  Location: [facility] ENDOSCOPY;  Service: Gastroenterology;  Laterality: N/A;   BIOPSY  11/07/2020   Procedure: BIOPSY;  Surgeon: Brice Campi Albino Alu., MD;  Location: [facility] ENDOSCOPY;  Service: Gastroenterology;;   BIOPSY  12/20/2020   Procedure: BIOPSY;  Surgeon: Normie Becton., MD;  Location: [facility]OSCOPY;  Service: Gastroenterology;;   BIOPSY  06/26/2021   Procedure: BIOPSY;  Surgeon: Normie Becton., MD;  Location: [facility] ENDOSCOPY;  Service: Gastroenterology;;   CARPAL TUNNEL RELEASE Left 02/21/2016   Procedure: CARPAL TUNNEL RELEASE;  Surgeon: Darrin Emerald, MD;  Location: [facility];  Service: Orthopedics;  Laterality: Left;   CHOLECYSTECTOMY N/A 10/09/2020   Procedure: LAPAROSCOPIC CHOLECYSTECTOMY;  Surgeon: Awilda Bogus, MD;  Location: [facility];  Service: General;  Laterality: N/A;   CYST ENTEROSTOMY N/A 11/07/2020   Procedure: CYST ENTEROSTOMY;  Surgeon: Normie Becton., MD;  Location: [facility] ENDOSCOPY;  Service: Gastroenterology;  Laterality: N/A;   CYST GASTROSTOMY  11/11/2020   Procedure: CYST NECROSECTOMY;  Surgeon: Brice Campi Albino Alu., MD;  Location: [facility] ENDOSCOPY;  Service: Gastroenterology;;   CYST GASTROSTOMY  11/14/2020   Procedure: CYST NECROSECTOMY;  Surgeon: Normie Becton., MD;  Location: [facility] ENDOSCOPY;  Service: Gastroenterology;;   CYST GASTROSTOMY  06/16/2022   Procedure: CYST GASTROSTOMY;  Surgeon: Normie Becton., MD;  Location: [facility]  ENDOSCOPY;   Service: Gastroenterology;;   CYST REMOVAL HAND     CYSTOSCOPY  06/26/2021   Procedure: CYSTOGASTROSTOMY;  Surgeon: Mansouraty, Albino Alu., MD;  Location: [facility] ENDOSCOPY;  Service: Gastroenterology;;   ENDOSCOPIC RETROGRADE CHOLANGIOPANCREATOGRAPHY (ERCP) WITH PROPOFOL  N/A 11/09/2020   Procedure: ENDOSCOPIC RETROGRADE CHOLANGIOPANCREATOGRAPHY (ERCP) WITH PROPOFOL ;  Surgeon: Normie Becton., MD;  Location: [facility] ENDOSCOPY;  Service: Gastroenterology;  Laterality: N/A;   ENDOSCOPIC RETROGRADE CHOLANGIOPANCREATOGRAPHY (ERCP) WITH PROPOFOL  N/A 12/20/2020   Procedure: ENDOSCOPIC RETROGRADE CHOLANGIOPANCREATOGRAPHY (ERCP) WITH PROPOFOL ;  Surgeon: Brice Campi Albino Alu., MD;  Location: [facility] ENDOSCOPY;  Service: Gastroenterology;  Laterality: N/A;   ENDOSCOPIC RETROGRADE CHOLANGIOPANCREATOGRAPHY (ERCP) WITH PROPOFOL  N/A 01/29/2021   Procedure: ENDOSCOPIC RETROGRADE CHOLANGIOPANCREATOGRAPHY (ERCP) WITH PROPOFOL ;  Surgeon: Brice Campi Albino Alu., MD;  Location: [facility] ENDOSCOPY;  Service: Gastroenterology;  Laterality: N/A;   ENDOSCOPIC RETROGRADE CHOLANGIOPANCREATOGRAPHY (ERCP) WITH PROPOFOL  N/A 07/24/2021   Procedure: ENDOSCOPIC RETROGRADE CHOLANGIOPANCREATOGRAPHY (ERCP) WITH PROPOFOL ;  Surgeon: Brice Campi Albino Alu., MD;  Location: [facility] ENDOSCOPY;  Service: Gastroenterology;  Laterality: N/A;   ENDOSCOPIC RETROGRADE CHOLANGIOPANCREATOGRAPHY (ERCP) WITH PROPOFOL  N/A 09/23/2023   Procedure: ENDOSCOPIC RETROGRADE CHOLANGIOPANCREATOGRAPHY (ERCP) WITH PROPOFOL ;  Surgeon: Brice Campi Albino Alu., MD;  Location: [facility] ENDOSCOPY;  Service: Gastroenterology;  Laterality: N/A;   ERCP N/A 10/10/2020   Procedure: ENDOSCOPIC RETROGRADE CHOLANGIOPANCREATOGRAPHY (ERCP);  Surgeon: Ruby Corporal, MD;  Location: [facility];  Service: Endoscopy;  Laterality: N/A;   ERCP     ERCP N/A 05/22/2022   Procedure: ENDOSCOPIC RETROGRADE CHOLANGIOPANCREATOGRAPHY (ERCP);  Surgeon: Normie Becton., MD;  Location: [facility]OSCOPY;   Service: Gastroenterology;  Laterality: N/A;   ESOPHAGOGASTRODUODENOSCOPY N/A 11/11/2020   Procedure: ESOPHAGOGASTRODUODENOSCOPY (EGD);  Surgeon: Normie Becton., MD;  Location: [facility] ENDOSCOPY;  Service: Gastroenterology;  Laterality: N/A;   ESOPHAGOGASTRODUODENOSCOPY N/A 11/14/2020   Procedure: ESOPHAGOGASTRODUODENOSCOPY (EGD);  Surgeon: Normie Becton., MD;  Location: [facility] ENDOSCOPY;  Service: Gastroenterology;  Laterality: N/A;   ESOPHAGOGASTRODUODENOSCOPY (EGD) WITH PROPOFOL  N/A 11/09/2020   Procedure: ESOPHAGOGASTRODUODENOSCOPY (EGD) WITH PROPOFOL ;  Surgeon: Brice Campi Albino Alu., MD;  Location: [facility] ENDOSCOPY;  Service: Gastroenterology;  Laterality: N/A;   ESOPHAGOGASTRODUODENOSCOPY (EGD) WITH PROPOFOL  N/A 11/07/2020   Procedure: ESOPHAGOGASTRODUODENOSCOPY (EGD) WITH PROPOFOL ;  Surgeon: Brice Campi Albino Alu., MD;  Location: [facility] ENDOSCOPY;  Service: Gastroenterology;  Laterality: N/A;   ESOPHAGOGASTRODUODENOSCOPY (EGD) WITH PROPOFOL  N/A 12/20/2020   Procedure: ESOPHAGOGASTRODUODENOSCOPY (EGD) WITH PROPOFOL ;  Surgeon: Brice Campi Albino Alu., MD;  Location: [facility] ENDOSCOPY;  Service: Gastroenterology;  Laterality: N/A;   ESOPHAGOGASTRODUODENOSCOPY (EGD) WITH PROPOFOL  N/A 06/26/2021   Procedure: ESOPHAGOGASTRODUODENOSCOPY (EGD) WITH PROPOFOL ;  Surgeon: Brice Campi Albino Alu., MD;  Location: [facility] ENDOSCOPY;  Service: Gastroenterology;  Laterality: N/A;   ESOPHAGOGASTRODUODENOSCOPY (EGD) WITH PROPOFOL  N/A 07/24/2021   Procedure: ESOPHAGOGASTRODUODENOSCOPY (EGD) WITH PROPOFOL ;  Surgeon: Brice Campi Albino Alu., MD;  Location: [facility] ENDOSCOPY;  Service: Gastroenterology;  Laterality: N/A;  AXIOS STENT   ESOPHAGOGASTRODUODENOSCOPY (EGD) WITH PROPOFOL  N/A 05/22/2022   Procedure: ESOPHAGOGASTRODUODENOSCOPY (EGD) WITH PROPOFOL ;  Surgeon: Brice Campi Albino Alu., MD;  Location: [facility] ENDOSCOPY;  Service: Gastroenterology;  Laterality: N/A;   ESOPHAGOGASTRODUODENOSCOPY (EGD) WITH PROPOFOL  N/A  06/16/2022   Procedure: ESOPHAGOGASTRODUODENOSCOPY (EGD) WITH PROPOFOL ;  Surgeon: Brice Campi Albino Alu., MD;  Location: [facility] ENDOSCOPY;  Service: Gastroenterology;  Laterality: N/A;   ESOPHAGOGASTRODUODENOSCOPY (EGD) WITH PROPOFOL  N/A 07/30/2022   Procedure: ESOPHAGOGASTRODUODENOSCOPY (EGD) WITH PROPOFOL ;  Surgeon: Brice Campi Albino Alu., MD;  Location: [facility] ENDOSCOPY;  Service: Gastroenterology;  Laterality: N/A;   ESOPHAGOGASTRODUODENOSCOPY (EGD) WITH PROPOFOL  N/A 03/30/2023   Procedure: ESOPHAGOGASTRODUODENOSCOPY (EGD) WITH PROPOFOL ;  Surgeon: Kenney Peacemaker, MD;  Location: [facility] ENDOSCOPY;  Service: Gastroenterology;  Laterality: N/A;   EUS  11/14/2020   Procedure: UPPER ENDOSCOPIC ULTRASOUND (EUS) LINEAR;  Surgeon: Normie Becton., MD;  Location: [facility] ENDOSCOPY;  Service: Gastroenterology;;   EUS N/A 06/26/2021   Procedure: UPPER ENDOSCOPIC ULTRASOUND (EUS) RADIAL;  Surgeon: Normie Becton., MD;  Location: [facility] ENDOSCOPY;  Service: Gastroenterology;  Laterality: N/A;   EUS N/A 05/22/2022   Procedure: UPPER ENDOSCOPIC ULTRASOUND (EUS) LINEAR;  Surgeon: Normie Becton., MD;  Location: [facility] ENDOSCOPY;  Service: Gastroenterology;  Laterality: N/A;   EUS N/A 06/16/2022   Procedure: UPPER ENDOSCOPIC ULTRASOUND (EUS) LINEAR;  Surgeon: Normie Becton., MD;  Location: [facility] ENDOSCOPY;  Service: Gastroenterology;  Laterality: N/A;   FINE NEEDLE ASPIRATION  05/22/2022   Procedure: FINE NEEDLE ASPIRATION;  Surgeon: Brice Campi Albino Alu., MD;  Location: [facility]OSCOPY;  Service: Gastroenterology;;   GASTROINTESTINAL STENT REMOVAL  12/20/2020   Procedure: GASTROINTESTINAL STENT REMOVAL;  Surgeon: Normie Becton., MD;  Location: [facility] ENDOSCOPY;  Service: Gastroenterology;;  cyst gastrostomy stent and double pig tail stents x2 removed   HERNIA REPAIR Left 2002   groin   INGUINAL HERNIA REPAIR Left 04/05/2017   Procedure: RECURRENT HERNIA REPAIR INGUINAL ADULT WITH MESH;  Surgeon:  Alanda Allegra, MD;  Location: [facility];  Service: General;  Laterality: Left;   MASS EXCISION Right 04/29/2020   Procedure: EXCISION MASS RIGHT WRIST;  Surgeon: Brunilda Capra, MD;  Location: [facility];  Service: Orthopedics;  Laterality: Right;   MASS EXCISION Right 10/09/2020   Procedure: EXCISION MASS, ABDOMINAL WALL, 2CM;  Surgeon: Awilda Bogus, MD;  Location: [facility];  Service: General;  Laterality: Right;   PANCREATIC STENT PLACEMENT  06/26/2021   Procedure: AXIOS STENT PLACEMENT;  Surgeon: Normie Becton., MD;  Location: [facility] ENDOSCOPY;  Service: Gastroenterology;;   PANCREATIC STENT PLACEMENT  06/16/2022   Procedure: PANCREATIC STENT PLACEMENT;  Surgeon: Normie Becton., MD;  Location: [facility]OSCOPY;  Service: Gastroenterology;;   REMOVAL OF STONES  12/20/2020   Procedure: REMOVAL OF STONES;  Surgeon: Normie Becton., MD;  Location: [facility]OSCOPY;  Service: Gastroenterology;;   REMOVAL OF STONES  07/24/2021   Procedure: REMOVAL OF STONES;  Surgeon: Normie Becton., MD;  Location: [facility] ENDOSCOPY;  Service: Gastroenterology;;   REMOVAL OF STONES  05/22/2022   Procedure: REMOVAL OF STONES;  Surgeon: Normie Becton., MD;  Location: [facility];  Service: Gastroenterology;;   [address] N/A 10/10/2020   Procedure: [address];  Surgeon: Ruby Corporal, MD;  Location: [facility];  Service: Endoscopy;  Laterality: N/A;   SPHINCTEROTOMY  07/24/2021   Procedure: SPHINCTEROTOMY;  Surgeon: Mansouraty, Albino Alu., MD;  Location: [facility] ENDOSCOPY;  Service: Gastroenterology;;   SPLENECTOMY, TOTAL N/A 11/17/2022   Procedure: SPLENECTOMY;  Surgeon: Lujean Sake, MD;  Location: [facility] OR;  Service: General;  Laterality: N/A;   SPYGLASS CHOLANGIOSCOPY N/A 12/20/2020   Procedure: ZOXWRUEA CHOLANGIOSCOPY;  Surgeon: Normie Becton., MD;  Location: [facility] ENDOSCOPY;  Service: Gastroenterology;  Laterality: N/A;   SPYGLASS CHOLANGIOSCOPY N/A  07/24/2021   Procedure: SPYGLASS CHOLANGIOSCOPY;  Surgeon: Normie Becton., MD;  Location: [facility] ENDOSCOPY;  Service: Gastroenterology;  Laterality: N/A;   STENT REMOVAL  11/09/2020   Procedure: STENT REMOVAL;  Surgeon: Normie Becton., MD;  Location: [facility] ENDOSCOPY;  Service: Gastroenterology;;   [place] REMOVAL  11/11/2020   Procedure: STENT REMOVAL;  Surgeon: Normie Becton., MD;  Location: [facility] ENDOSCOPY;  Service: Gastroenterology;;   STENT REMOVAL  11/14/2020   Procedure: STENT REMOVAL;  Surgeon: Brice Campi Albino Alu., MD;  Location: [facility] ENDOSCOPY;  Service: Gastroenterology;;   [place] REMOVAL  12/20/2020   Procedure: [place] REMOVAL;  Surgeon: Normie Becton., MD;  Location: [facility];  Service: Gastroenterology;;  biliary x2   STENT REMOVAL  07/24/2021   Procedure: [facility] STENT REMOVAL;  Surgeon: Normie Becton., MD;  Location: [facility] ENDOSCOPY;  Service: Gastroenterology;;   [place] REMOVAL  07/30/2022   Procedure: STENT REMOVAL;  Surgeon: Normie Becton., MD;  Location: [facility];  Service: Gastroenterology;;   UPPER ESOPHAGEAL ENDOSCOPIC ULTRASOUND (EUS) N/A 11/07/2020   Procedure: UPPER ESOPHAGEAL ENDOSCOPIC ULTRASOUND (EUS);  Surgeon: Normie Becton., MD;  Location: [facility] ENDOSCOPY;  Service: Gastroenterology;  Laterality: N/A;   UPPER GASTROINTESTINAL ENDOSCOPY     WOUND DEBRIDEMENT  11/09/2020   Procedure: CYST NECROSECTOMY;  Surgeon: Brice Campi Albino Alu., MD;  Location: [facility] ENDOSCOPY;  Service: Gastroenterology;;    Family History family history includes Diabetes in his father; Heart disease in his father and maternal grandmother; Stroke in his maternal grandfather.  Social History Social History   Socioeconomic History   Marital status: Single    Spouse name: Not on file   Number of children: 0   Years of education: Not on file   Highest education level: GED or equivalent  Occupational History   Not on file  Tobacco Use    Smoking status: Every Day    Current packs/day: 0.25    Average packs/day: 0.3 packs/day for 26.0 years (6.5 ttl pk-yrs)    Types: Cigarettes   Smokeless tobacco: Never   Tobacco comments:    4-5 cig daily as of 10/07/2020.  Vaping Use   Vaping status: Never Used  Substance and Sexual Activity   Alcohol use: No   Drug use: No   Sexual activity: Never    Birth control/protection: None  Other Topics Concern   Not on file  Social History Narrative   Not on file   Social Drivers of Health   Financial Resource Strain: Low Risk  (07/27/2023)   Overall Financial Resource Strain (CARDIA)    Difficulty of Paying Living Expenses: Not very hard  Food Insecurity: No Food Insecurity (08/16/2023)   Hunger Vital Sign    Worried About Running Out of Food in the Last Year: Never true    Ran Out of Food in the Last Year: Never true  Recent Concern: Food Insecurity - Food Insecurity Present (07/27/2023)   Hunger Vital Sign    Worried About Running Out of Food in the Last Year: Never true    Ran Out of Food in the Last Year: Sometimes true  Transportation Needs: No Transportation Needs (08/16/2023)   PRAPARE - Administrator, Civil Service (Medical): No    Lack of Transportation (Non-Medical): No  Physical Activity: Insufficiently Active (07/27/2023)   Exercise Vital Sign    Days of Exercise per Week: 2 days    Minutes of Exercise per Session: 10 min  Stress: No Stress Concern Present (07/27/2023)   [employer] of Occupational Health - Occupational Stress Questionnaire    Feeling of Stress : Only a little  Social Connections: Unknown (07/27/2023)   Social Connection and Isolation Panel    Frequency of Communication with Friends and Family: Three times a week    Frequency of Social Gatherings with Friends and Family: Once a week    Attends Religious Services: Patient declined    Active Member of Clubs or Organizations: No    Attends [employer]  or Organization Meetings: Not on file     Marital Status: Divorced  Intimate Partner Violence: Not At Risk (08/16/2023)   Humiliation, Afraid, Rape, and Kick questionnaire    Fear of Current or Ex-Partner: No    Emotionally Abused: No    Physically Abused: No    Sexually Abused: No    Lab Results  Component Value Date   HGBA1C 10.5 (A) 11/16/2023   HGBA1C 13.7 (H) 09/14/2023   HGBA1C >14.0 (H) 06/08/2023   Lab Results  Component Value Date   CHOL 158 09/14/2023   Lab Results  Component Value Date   HDL 44 09/14/2023   Lab Results  Component Value Date   LDLCALC 98 09/14/2023   Lab Results  Component Value Date   TRIG 72 09/14/2023   Lab Results  Component Value Date   CHOLHDL 3.6 09/14/2023   Lab Results  Component Value Date   CREATININE 0.86 12/02/2023   Lab Results  Component Value Date   GFR 99.32 12/02/2023   Lab Results  Component Value Date   MICROALBUR 0.5 09/14/2023      Component Value Date/Time   NA 139 12/02/2023 0811   NA 135 08/18/2023 1205   K 4.4 12/02/2023 0811   CL 102 12/02/2023 0811   CO2 29 12/02/2023 0811   GLUCOSE 188 (H) 12/02/2023 0811   BUN 15 12/02/2023 0811   BUN 15 08/18/2023 1205   CREATININE 0.86 12/02/2023 0811   CREATININE 0.79 09/14/2023 0940   CALCIUM  9.2 12/02/2023 0811   PROT 6.7 12/02/2023 0811   PROT 5.7 (L) 08/18/2023 1205   ALBUMIN  4.2 12/02/2023 0811   ALBUMIN  3.7 (L) 08/18/2023 1205   AST 19 12/02/2023 0811   ALT 29 12/02/2023 0811   ALKPHOS 130 (H) 12/02/2023 0811   BILITOT 0.4 12/02/2023 0811   BILITOT <0.2 08/18/2023 1205   GFRNONAA >60 08/13/2023 0450   GFRNONAA 89 04/22/2015 0828   GFRAA 85 10/04/2019 1433   GFRAA >89 04/22/2015 0828      Latest Ref Rng & Units 12/02/2023    8:11 AM 09/14/2023    9:40 AM 08/18/2023   12:05 PM  BMP  Glucose 70 - 99 mg/dL 962  952  841   BUN 6 - 23 mg/dL 15  16  15    Creatinine 0.40 - 1.50 mg/dL 3.24  4.01  0.27   BUN/Creat Ratio 6 - 22 (calc)  SEE NOTE:  20   Sodium 135 - 145 mEq/L 139  136  135    Potassium 3.5 - 5.1 mEq/L 4.4  4.8  4.7   Chloride 96 - 112 mEq/L 102  98  95   CO2 19 - 32 mEq/L 29  32  26   Calcium  8.4 - 10.5 mg/dL 9.2  9.7  9.4        Component Value Date/Time   WBC 14.1 (H) 12/02/2023 0811   RBC 5.12 12/02/2023 0811   HGB 15.9 12/02/2023 0811   HGB 13.8 08/18/2023 1205   HCT 46.8 12/02/2023 0811   HCT 41.0 08/18/2023 1205   PLT 429.0 (H) 12/02/2023 0811   PLT 484 (H) 08/18/2023 1205   MCV 91.4 12/02/2023 0811   MCV 94 08/18/2023 1205   MCH 31.7 08/18/2023 1205   MCH 31.7 08/12/2023 0257   MCHC 34.1 12/02/2023 0811   RDW 13.9 12/02/2023 0811   RDW 12.4 08/18/2023 1205   LYMPHSABS 3.7 (H) 08/18/2023 1205   MONOABS 0.7 07/07/2023 1310   EOSABS  0.1 08/18/2023 1205   BASOSABS 0.1 08/18/2023 1205     Parts of this note may have been dictated using voice recognition software. There may be variances in spelling and vocabulary which are unintentional. Not all errors are proofread. Please notify the [facility] if any discrepancies are noted or if the meaning of any statement is not clear.

## 2023-12-15 NOTE — Addendum Note (Signed)
 Addended by: Waneta Gut on: 12/15/2023 03:40 PM   Modules accepted: Orders

## 2023-12-15 NOTE — Patient Instructions (Addendum)
 Will recommend the following: Start synjardy  XR 11/998 mg 1 pill a day, if well-tolerated increase it to twice a day with meals (not opting regular metformin given history of GI issues sometimes): Sending for prior auth per pt  Semglee  25 units bid, increase by 2 units every day if BG>150 Novolog  12 units tid 15 min before meals, increase by 2 units every day if BG>150 Correction scale: Use in addition to your meal time/short acting insulin  based on blood sugars as follows:  151 - 180: 1 unit 181 - 210: 2 units 211 - 240: 3 units 241 - 270: 4 units 271 - 300: 5 units 301 - 330: 6 units 331 - 360: 7 units 361 - 390: 8 units 391 - 420/High : 9 units

## 2023-12-28 ENCOUNTER — Encounter: Payer: Self-pay | Admitting: Family Medicine

## 2023-12-28 ENCOUNTER — Ambulatory Visit: Payer: MEDICAID | Admitting: Family Medicine

## 2023-12-28 VITALS — BP 99/61 | HR 70 | Temp 98.5°F | Ht 70.0 in | Wt 181.2 lb

## 2023-12-28 DIAGNOSIS — K861 Other chronic pancreatitis: Secondary | ICD-10-CM

## 2023-12-28 DIAGNOSIS — R52 Pain, unspecified: Secondary | ICD-10-CM

## 2023-12-28 DIAGNOSIS — R109 Unspecified abdominal pain: Secondary | ICD-10-CM

## 2023-12-28 DIAGNOSIS — G8929 Other chronic pain: Secondary | ICD-10-CM

## 2023-12-28 MED ORDER — OXYCODONE HCL 10 MG PO TABS: 10.0000 mg | ORAL_TABLET | Freq: Four times a day (QID) | ORAL | 0 refills | Status: DC | PRN

## 2023-12-28 NOTE — Progress Notes (Signed)
 Subjective: CC: Follow-up chronic pain PCP: Jolinda Norene HERO, DO YEP:Steve Romero is a 53 y.o. male presenting to clinic today for:  1.  Chronic biliary pancreatitis Patient reports that he has been able to go a few days without taking medication.  He has been utilizing his oxycodone  more often than not but still has about 5 or 10 tablets leftover from the previous supply.  He reports having bowel movements at least 1-2 times daily.  He reports no rectal pain or bleeding.  He is tolerating p.o. intake better and they seem to be getting his blood sugar under control as he switched the site of where he injects his insulin .  Overall things seem to be moving in the right direction.  Denies any excessive daytime sedation with medications, falls etc.   ROS: Per HPI  Allergies  Allergen Reactions   Desipramine Nausea Only and Rash   Past Medical History:  Diagnosis Date   Acute pancreatitis without necrosis or infection, unspecified    AKI (acute kidney injury) (HCC) 05/17/2022   Anxiety 10/2014   Ascites    Bipolar disorder (HCC) age 50   COPD (chronic obstructive pulmonary disease) (HCC) 2016   Depression age 41   Enlarged prostate    Hyperlipidemia 2016   Hypertension 2013   Nausea vomiting and diarrhea 06/15/2022   Schizoaffective disorder (HCC) 11/13/2014   Sleep apnea    Does not wear c-pap, sleeps in sitting up position per pt   Thyroid  disease 11/2014    Current Outpatient Medications:    Accu-Chek Softclix Lancets lancets, Check BS in the morning and at night Dx E11.9, Disp: 200 each, Rfl: 3   acetaminophen  (TYLENOL ) 500 MG tablet, Take 2 tablets (1,000 mg total) by mouth every 8 (eight) hours as needed for mild pain (pain score 1-3)., Disp: , Rfl:    albuterol  (VENTOLIN  HFA) 108 (90 Base) MCG/ACT inhaler, Inhale 2 puffs into the lungs every 6 (six) hours as needed for wheezing or shortness of breath., Disp: 8 g, Rfl: 0   aspirin  EC 81 MG tablet, Take 81-162 mg by  mouth daily as needed (for pain). Swallow whole., Disp: , Rfl:    Continuous Blood Gluc Receiver (FREESTYLE LIBRE READER) DEVI, 1 Units by Does not apply route daily. UAD to test BGs daily. Dx E11.9, Disp: 1 each, Rfl: 1   Continuous Glucose Receiver (FREESTYLE LIBRE 3 READER) DEVI, 1 Device by Does not apply route continuous., Disp: 1 each, Rfl: 0   Continuous Glucose Sensor (FREESTYLE LIBRE 3 PLUS SENSOR) MISC, Check BGs continuously. E11.9 Change sensor every 15 days., Disp: 6 each, Rfl: 3   Empagliflozin-metFORMIN HCl ER (SYNJARDY  XR) 11-998 MG TB24, Take 1 tablet by mouth 2 (two) times daily., Disp: 60 tablet, Rfl: 1   Glucagon  (BAQSIMI  ONE PACK) 3 MG/DOSE POWD, Place 1 Device into the nose as needed (Low blood sugar with impaired consciousness)., Disp: 2 each, Rfl: 3   glucose blood (ACCU-CHEK GUIDE TEST) test strip, Check BS in the morning and at night Dx E11.9, Disp: 200 each, Rfl: 3   Insulin  Pen Needle (BD PEN NEEDLE NANO 2ND GEN) 32G X 4 MM MISC, UAD to administer insulin  Dx E11.10, Disp: 100 each, Rfl: 3   lipase/protease/amylase (CREON ) 36000 UNITS CPEP capsule, Take 3 capsules with meals and 1-2  capsule with snacks ( up too 2 snacks ) (Patient taking differently: Take 36,000-108,000 Units by mouth See admin instructions. Take 108,000-144,000 units by mouth with each meal  eaten and 72,000 units with each snack), Disp: 570 capsule, Rfl: 2   naloxone  (NARCAN ) nasal spray 4 mg/0.1 mL, Place 1 spray into the nose as directed., Disp: , Rfl:    NOVOLOG  RELION 100 UNIT/ML injection, Inject 20 Units into the skin See admin instructions. Inject 15 units into the skin three times a day with meals and 5-10 units as needed with snacks, depending on BGL reading, Disp: , Rfl:    ondansetron  (ZOFRAN ) 4 MG tablet, Take 4 mg by mouth every 6 (six) hours as needed for nausea or vomiting., Disp: , Rfl:    Oxycodone  HCl 10 MG TABS, Take 1 tablet (10 mg total) by mouth every 6 (six) hours as needed (for pain).  To last 3 months, Disp: 90 tablet, Rfl: 0   rosuvastatin  (CRESTOR ) 10 MG tablet, Take 1 tablet (10 mg total) by mouth daily., Disp: 90 tablet, Rfl: 3   SEMGLEE , YFGN, 100 UNIT/ML Pen, Inject 50 Units into the skin 2 (two) times daily., Disp: , Rfl:  Social History   Socioeconomic History   Marital status: Single    Spouse name: Not on file   Number of children: 0   Years of education: Not on file   Highest education level: GED or equivalent  Occupational History   Not on file  Tobacco Use   Smoking status: Every Day    Current packs/day: 0.25    Average packs/day: 0.3 packs/day for 26.0 years (6.5 ttl pk-yrs)    Types: Cigarettes   Smokeless tobacco: Never   Tobacco comments:    4-5 cig daily as of 10/07/2020.  Vaping Use   Vaping status: Never Used  Substance and Sexual Activity   Alcohol use: No   Drug use: No   Sexual activity: Never    Birth control/protection: None  Other Topics Concern   Not on file  Social History Narrative   Not on file   Social Drivers of Health   Financial Resource Strain: Low Risk  (07/27/2023)   Overall Financial Resource Strain (CARDIA)    Difficulty of Paying Living Expenses: Not very hard  Food Insecurity: No Food Insecurity (08/16/2023)   Hunger Vital Sign    Worried About Running Out of Food in the Last Year: Never true    Ran Out of Food in the Last Year: Never true  Recent Concern: Food Insecurity - Food Insecurity Present (07/27/2023)   Hunger Vital Sign    Worried About Running Out of Food in the Last Year: Never true    Ran Out of Food in the Last Year: Sometimes true  Transportation Needs: No Transportation Needs (08/16/2023)   PRAPARE - Administrator, Civil Service (Medical): No    Lack of Transportation (Non-Medical): No  Physical Activity: Insufficiently Active (07/27/2023)   Exercise Vital Sign    Days of Exercise per Week: 2 days    Minutes of Exercise per Session: 10 min  Stress: No Stress Concern Present  (07/27/2023)   [employer] of Occupational Health - Occupational Stress Questionnaire    Feeling of Stress : Only a little  Social Connections: Unknown (07/27/2023)   Social Connection and Isolation Panel    Frequency of Communication with Friends and Family: Three times a week    Frequency of Social Gatherings with Friends and Family: Once a week    Attends Religious Services: Patient declined    Database administrator or Organizations: No    Attends Banker Meetings:  Not on file    Marital Status: Divorced  Intimate Partner Violence: Not At Risk (08/16/2023)   Humiliation, Afraid, Rape, and Kick questionnaire    Fear of Current or Ex-Partner: No    Emotionally Abused: No    Physically Abused: No    Sexually Abused: No   Family History  Problem Relation Age of Onset   Diabetes Father    Heart disease Father    Heart disease Maternal Grandmother    Stroke Maternal Grandfather    Colon cancer Neg Hx    Esophageal cancer Neg Hx    Inflammatory bowel disease Neg Hx    Liver disease Neg Hx    Pancreatic cancer Neg Hx    Rectal cancer Neg Hx    Stomach cancer Neg Hx     Objective: Office vital signs reviewed. BP 99/61   Pulse 70   Temp 98.5 F (36.9 C)   Ht 5' 10 (1.778 m)   Wt 181 lb 3.2 oz (82.2 kg)   SpO2 96%   BMI 26.00 kg/m   Physical Examination:  General: Awake, alert, nontoxic-appearing male, No acute distress HEENT: Sclera white.  Face appears to be fuller Cardio: regular rate and rhythm, S1S2 heard, no murmurs appreciated Pulm: clear to auscultation bilaterally, no wheezes, rhonchi or rales; normal work of breathing on room air GI: Nondistended  Assessment/ Plan: 53 y.o. male   Chronic biliary pancreatitis (HCC) - Plan: Oxycodone  HCl 10 MG TABS  Chronic abdominal pain - Plan: Oxycodone  HCl 10 MG TABS  Pain management - Plan: Oxycodone  HCl 10 MG TABS  He is looking healthier.  Sounds like he is on the right track with diabetes  management.  His pain is controlled with up to once daily oxycodone  and he is using appropriately, skipping days that are not especially painful.  He is up-to-date on UDS and CSC and the national narcotic database was reviewed with no red flags.  He may follow-up in 3 months, sooner if concerns arise   Mathew Storck CHRISTELLA Fielding, DO [facility] [phone]

## 2024-01-03 ENCOUNTER — Encounter: Payer: Self-pay | Admitting: "Endocrinology

## 2024-01-03 ENCOUNTER — Ambulatory Visit (INDEPENDENT_AMBULATORY_CARE_PROVIDER_SITE_OTHER): Payer: MEDICAID | Admitting: "Endocrinology

## 2024-01-03 VITALS — BP 120/80 | HR 85 | Ht 70.0 in | Wt 181.0 lb

## 2024-01-03 DIAGNOSIS — Z7984 Long term (current) use of oral hypoglycemic drugs: Secondary | ICD-10-CM | POA: Diagnosis not present

## 2024-01-03 DIAGNOSIS — Z794 Long term (current) use of insulin: Secondary | ICD-10-CM | POA: Diagnosis not present

## 2024-01-03 DIAGNOSIS — E7800 Pure hypercholesterolemia, unspecified: Secondary | ICD-10-CM

## 2024-01-03 DIAGNOSIS — E11649 Type 2 diabetes mellitus with hypoglycemia without coma: Secondary | ICD-10-CM

## 2024-01-03 MED ORDER — NOVOLOG FLEXPEN 100 UNIT/ML ~~LOC~~ SOPN: PEN_INJECTOR | SUBCUTANEOUS | 3 refills | Status: DC

## 2024-01-03 MED ORDER — SEMGLEE (YFGN) 100 UNIT/ML ~~LOC~~ SOPN
40.0000 [IU] | PEN_INJECTOR | Freq: Every day | SUBCUTANEOUS | 3 refills | Status: DC
Start: 2024-01-03 — End: 2024-02-01

## 2024-01-03 NOTE — Progress Notes (Signed)
 Outpatient Endocrinology Note Steve Birmingham, MD  01/03/24   Steve Romero 1971/02/19 [id number]  Referring Provider: Jolinda Norene HERO, DO Primary Care Provider: Jolinda Norene HERO, DO Reason for consultation: Subjective   Assessment & Plan  Diagnoses and all orders for this visit:  Uncontrolled type 2 diabetes mellitus with hypoglycemia without coma (HCC) -     SEMGLEE , YFGN, 100 UNIT/ML Pen; Inject 40 Units into the skin daily. -     insulin  aspart (NOVOLOG  FLEXPEN) 100 UNIT/ML FlexPen; 10 units tidac + sliding scale, max dose 60 units a day -     Ambulatory referral to Podiatry  Long-term insulin  use (HCC)  Long term (current) use of oral hypoglycemic drugs  Pure hypercholesterolemia   Diabetes Type II complicated by neuropathy,  Lab Results  Component Value Date   GFR 99.32 12/02/2023   Hba1c goal less than 7, current Hba1c is  Lab Results  Component Value Date   HGBA1C 10.5 (A) 11/16/2023   Will recommend the following: Synjardy  XR 11/998 mg bid  Semglee  40 units qam Novolog  10 units tid 15 min before meals Correction scale: Use in addition to your meal time/short acting insulin  (both together) based on blood sugars as follows:  151 - 180: 1 unit 181 - 210: 2 units 211 - 240: 3 units 241 - 270: 4 units 271 - 300: 5 units 301 - 330: 6 units 331 - 360: 7 units 361 - 390: 8 units 391 - 420/High : 9 units   11/09/23: Ordered diabetes education that patient got today, pt is drinking sweet drinks all day leading to high sugars, was not taking 20 units but only correction scale, had a low in 10/2023, discussed hypoglycemia management again, with plans to try CeQur for being compliant with short acting insulin  as well as sticking to 1 drink a day instead of drinking all day the sugary beverages 11/16/23 patient again got diabetes education today.  Patient repeatedly says that he has been doing everything right, taking up to 6 shots of insulin  a: day, 2 of  the Semglee  and up to 4 of the NovoLog  15 units with meals.  His blood sugars continue to stay high except rarely knows that patient keeps telling because of his pancreas.  Reinforced compliance and discussed the dangers of continuing dose elevation of Novolog  in case of noncompliance, patient understands and agreed 12/15/23: seen DM educator, using insulin  in scar tissue, drinking juices and mountain dew  01/03/24: ordered podiatry referral, r/o onychomycosis; discussed again about decreasing smoking  No known contraindications/side effects to any of above medications Glucagon  discussed and prescribed with refills on 09/2023  -Last LD and Tg are as follows: Lab Results  Component Value Date   LDLCALC 98 09/14/2023    Lab Results  Component Value Date   TRIG 72 09/14/2023   -stopped rosuvastatin  10 mg every day, reinforced it previously  -Follow low fat diet and exercise   -Blood pressure goal <140/90 - Microalbumin/creatinine goal is < 30 -Last MA/Cr is as follows: Lab Results  Component Value Date   MICROALBUR 0.5 09/14/2023   -not on ACE/ARB  -diet changes including salt restriction -limit eating outside -counseled BP targets per standards of diabetes care -uncontrolled blood pressure can lead to retinopathy, nephropathy and cardiovascular and atherosclerotic heart disease  Reviewed and counseled on: -A1C target -Blood sugar targets -Complications of uncontrolled diabetes  -Checking blood sugar before meals and bedtime and bring log next visit -All medications with mechanism  of action and side effects -Hypoglycemia management: rule of 15's, Glucagon  Emergency Kit and medical alert ID -low-carb low-fat plate-method diet -At least 20 minutes of physical activity per day -Annual dilated retinal eye exam and foot exam -compliance and follow up needs -follow up as scheduled or earlier if problem gets worse  Call if blood sugar is less than 70 or consistently above 250     Take a 15 gm snack of carbohydrate at bedtime before you go to sleep if your blood sugar is less than 100.    If you are going to fast after midnight for a test or procedure, ask your physician for instructions on how to reduce/decrease your insulin  dose.    Call if blood sugar is less than 70 or consistently above 250  -Treating a low sugar by rule of 15  (15 gms of sugar every 15 min until sugar is more than 70) If you feel your sugar is low, test your sugar to be sure If your sugar is low (less than 70), then take 15 grams of a fast acting Carbohydrate (3-4 glucose tablets or glucose gel or 4 ounces of juice or regular soda) Recheck your sugar 15 min after treating low to make sure it is more than 70 If sugar is still less than 70, treat again with 15 grams of carbohydrate          Don't drive the hour of hypoglycemia  If unconscious/unable to eat or drink by mouth, use glucagon  injection or nasal spray baqsimi  and call 911. Can repeat again in 15 min if still unconscious.  Return in about 4 weeks (around 01/31/2024).   I have reviewed current medications, nurse's notes, allergies, vital signs, past medical and surgical history, family medical history, and social history for this encounter. Counseled patient on symptoms, examination findings, lab findings, imaging results, treatment decisions and monitoring and prognosis. The patient understood the recommendations and agrees with the treatment plan. All questions regarding treatment plan were fully answered.  Steve Birmingham, MD  01/03/24   History of Present Illness Steve Romero is a 53 y.o. year old male who presents for follow up of Type II diabetes mellitus.  Home diabetes regimen: Synjardy  XR 11/998 mg qd  Semglee  40 units bid Novolog  10 units 15-20 min tidac + sliding scale   COMPLICATIONS -  MI/Stroke -  retinopathy +  neuropathy -  nephropathy  BLOOD SUGAR DATA CGM interpretation: At today's visit, we reviewed her  CGM downloads. The full report is scanned in the media. Reviewing the CGM trends, BG are elevated across the day with the random lows in between.  Physical Exam  BP 120/80   Pulse 85   Ht 5' 10 (1.778 m)   Wt 181 lb (82.1 kg)   SpO2 98%   BMI 25.97 kg/m    Constitutional: well developed, well nourished Head: normocephalic, atraumatic Eyes: sclera anicteric, no redness Neck: supple Lungs: normal respiratory effort Neurology: alert and oriented Skin: dry, no appreciable rashes Musculoskeletal: no appreciable defects Psychiatric: normal mood and affect Diabetic Foot Exam - Simple   Simple Foot Form Diabetic Foot exam was performed with the following findings: Yes 01/03/2024  2:35 PM  Visual Inspection No deformities, no ulcerations, no other skin breakdown bilaterally: Yes Sensation Testing Intact to touch and monofilament testing bilaterally: Yes Pulse Check Posterior Tibialis and Dorsalis pulse intact bilaterally: Yes Comments Monofilament 2/3 L, 3/3 R B/L yellow toe nail, breaking R big toe nail  Current Medications Patient's Medications  New Prescriptions   INSULIN  ASPART (NOVOLOG  FLEXPEN) 100 UNIT/ML FLEXPEN    10 units tidac + sliding scale, max dose 60 units a day  Previous Medications   ACCU-CHEK SOFTCLIX LANCETS LANCETS    Check BS in the morning and at night Dx E11.9   ACETAMINOPHEN  (TYLENOL ) 500 MG TABLET    Take 2 tablets (1,000 mg total) by mouth every 8 (eight) hours as needed for mild pain (pain score 1-3).   ALBUTEROL  (VENTOLIN  HFA) 108 (90 BASE) MCG/ACT INHALER    Inhale 2 puffs into the lungs every 6 (six) hours as needed for wheezing or shortness of breath.   ASPIRIN  EC 81 MG TABLET    Take 81-162 mg by mouth daily as needed (for pain). Swallow whole.   CONTINUOUS BLOOD GLUC RECEIVER (FREESTYLE LIBRE READER) DEVI    1 Units by Does not apply route daily. UAD to test BGs daily. Dx E11.9   CONTINUOUS GLUCOSE RECEIVER (FREESTYLE LIBRE 3 READER) DEVI    1  Device by Does not apply route continuous.   CONTINUOUS GLUCOSE SENSOR (FREESTYLE LIBRE 3 PLUS SENSOR) MISC    Check BGs continuously. E11.9 Change sensor every 15 days.   EMPAGLIFLOZIN-METFORMIN HCL ER (SYNJARDY  XR) 11-998 MG TB24    Take 1 tablet by mouth 2 (two) times daily.   GLUCAGON  (BAQSIMI  ONE PACK) 3 MG/DOSE POWD    Place 1 Device into the nose as needed (Low blood sugar with impaired consciousness).   GLUCOSE BLOOD (ACCU-CHEK GUIDE TEST) TEST STRIP    Check BS in the morning and at night Dx E11.9   INSULIN  PEN NEEDLE (BD PEN NEEDLE NANO 2ND GEN) 32G X 4 MM MISC    UAD to administer insulin  Dx E11.10   LIPASE/PROTEASE/AMYLASE (CREON ) 36000 UNITS CPEP CAPSULE    Take 3 capsules with meals and 1-2  capsule with snacks ( up too 2 snacks )   NALOXONE  (NARCAN ) NASAL SPRAY 4 MG/0.1 ML    Place 1 spray into the nose as directed.   ONDANSETRON  (ZOFRAN ) 4 MG TABLET    Take 4 mg by mouth every 6 (six) hours as needed for nausea or vomiting.   OXYCODONE  HCL 10 MG TABS    Take 1 tablet (10 mg total) by mouth every 6 (six) hours as needed (for pain). To last 3 months   ROSUVASTATIN  (CRESTOR ) 10 MG TABLET    Take 1 tablet (10 mg total) by mouth daily.  Modified Medications   Modified Medication Previous Medication   SEMGLEE , YFGN, 100 UNIT/ML PEN SEMGLEE , YFGN, 100 UNIT/ML Pen      Inject 40 Units into the skin daily.    Inject 50 Units into the skin 2 (two) times daily.  Discontinued Medications   NOVOLOG  RELION 100 UNIT/ML INJECTION    Inject 20 Units into the skin See admin instructions. Inject 15 units into the skin three times a day with meals and 5-10 units as needed with snacks, depending on BGL reading    Allergies Allergies  Allergen Reactions   Desipramine Nausea Only and Rash    Past Medical History Past Medical History:  Diagnosis Date   Acute pancreatitis without necrosis or infection, unspecified    AKI (acute kidney injury) (HCC) 05/17/2022   Anxiety 10/2014   Ascites     Bipolar disorder (HCC) age 62   COPD (chronic obstructive pulmonary disease) (HCC) 2016   Depression age 29   DKA (diabetic ketoacidosis) (HCC) 08/11/2023  Enlarged prostate    Hyperlipidemia 2016   Hypertension 2013   Nausea vomiting and diarrhea 06/15/2022   Schizoaffective disorder (HCC) 11/13/2014   Sleep apnea    Does not wear c-pap, sleeps in sitting up position per pt   Thyroid  disease 11/2014    Past Surgical History Past Surgical History:  Procedure Laterality Date   BALLOON DILATION N/A 11/07/2020   Procedure: BALLOON DILATION;  Surgeon: Wilhelmenia Aloha Raddle., MD;  Location: [facility] ENDOSCOPY;  Service: Gastroenterology;  Laterality: N/A;   BALLOON DILATION N/A 06/26/2021   Procedure: BALLOON DILATION;  Surgeon: Wilhelmenia Aloha Raddle., MD;  Location: [facility] ENDOSCOPY;  Service: Gastroenterology;  Laterality: N/A;   BILIARY BRUSHING  09/23/2023   Procedure: BRUSH BIOPSY, BILE DUCT;  Surgeon: Wilhelmenia Aloha Raddle., MD;  Location: [facility]COPY;  Service: Gastroenterology;;   BILIARY DILATION  07/24/2021   Procedure: BILIARY DILATION;  Surgeon: Wilhelmenia Aloha Raddle., MD;  Location: [facility] ENDOSCOPY;  Service: Gastroenterology;;   BILIARY DILATION  05/22/2022   Procedure: BILIARY DILATION;  Surgeon: Wilhelmenia Aloha Raddle., MD;  Location: [facility] ENDOSCOPY;  Service: Gastroenterology;;   BILIARY DILATION  06/16/2022   Procedure: GASTROSTOMY DILATION;  Surgeon: Wilhelmenia Aloha Raddle., MD;  Location: [facility];  Service: Gastroenterology;;   BILIARY STENT PLACEMENT N/A 10/10/2020   Procedure: BILIARY STENT PLACEMENT;  Surgeon: Golda Claudis PENNER, MD;  Location: [facility];  Service: Endoscopy;  Laterality: N/A;   BILIARY STENT PLACEMENT  11/09/2020   Procedure: BILIARY STENT PLACEMENT;  Surgeon: Wilhelmenia Aloha Raddle., MD;  Location: [facility] ENDOSCOPY;  Service: Gastroenterology;;   BILIARY STENT PLACEMENT  11/07/2020   Procedure: BILIARY STENT PLACEMENT;  Surgeon: Wilhelmenia Aloha Raddle., MD;   Location: [facility] ENDOSCOPY;  Service: Gastroenterology;;   BILIARY STENT PLACEMENT  11/11/2020   Procedure: BILIARY STENT PLACEMENT;  Surgeon: Wilhelmenia Aloha Raddle., MD;  Location: [facility] ENDOSCOPY;  Service: Gastroenterology;;   BILIARY STENT PLACEMENT  11/14/2020   Procedure: BILIARY STENT PLACEMENT;  Surgeon: Wilhelmenia Aloha Raddle., MD;  Location: [facility] ENDOSCOPY;  Service: Gastroenterology;;   BILIARY STENT PLACEMENT N/A 01/29/2021   Procedure: BILIARY STENT PLACEMENT;  Surgeon: Wilhelmenia Aloha Raddle., MD;  Location: [facility];  Service: Gastroenterology;  Laterality: N/A;   BILIARY STENT PLACEMENT  06/26/2021   Procedure: BILIARY STENT PLACEMENT;  Surgeon: Wilhelmenia Aloha Raddle., MD;  Location: [facility] ENDOSCOPY;  Service: Gastroenterology;;   BILIARY STENT PLACEMENT  07/24/2021   Procedure: BILIARY STENT PLACEMENT;  Surgeon: Wilhelmenia Aloha Raddle., MD;  Location: [facility] ENDOSCOPY;  Service: Gastroenterology;;   BILIARY STENT PLACEMENT N/A 09/23/2023   Procedure: INSERTION, STENT, BILE DUCT;  Surgeon: Wilhelmenia Aloha Raddle., MD;  Location: [facility]OSCOPY;  Service: Gastroenterology;  Laterality: N/A;   BIOPSY  11/07/2020   Procedure: BIOPSY;  Surgeon: Wilhelmenia Aloha Raddle., MD;  Location: [facility] ENDOSCOPY;  Service: Gastroenterology;;   BIOPSY  12/20/2020   Procedure: BIOPSY;  Surgeon: Wilhelmenia Aloha Raddle., MD;  Location: [facility];  Service: Gastroenterology;;   BIOPSY  06/26/2021   Procedure: BIOPSY;  Surgeon: Wilhelmenia Aloha Raddle., MD;  Location: [facility] ENDOSCOPY;  Service: Gastroenterology;;   CARPAL TUNNEL RELEASE Left 02/21/2016   Procedure: CARPAL TUNNEL RELEASE;  Surgeon: Taft FORBES Minerva, MD;  Location: [facility];  Service: Orthopedics;  Laterality: Left;   CHOLECYSTECTOMY N/A 10/09/2020   Procedure: LAPAROSCOPIC CHOLECYSTECTOMY;  Surgeon: Kallie Manuelita BROCKS, MD;  Location: [facility];  Service: General;  Laterality: N/A;   CYST ENTEROSTOMY N/A 11/07/2020   Procedure: CYST ENTEROSTOMY;   Surgeon: Wilhelmenia Aloha Raddle., MD;  Location: [facility] ENDOSCOPY;  Service: Gastroenterology;  Laterality: N/A;   CYST GASTROSTOMY  11/11/2020   Procedure: CYST NECROSECTOMY;  Surgeon: Wilhelmenia Aloha Raddle., MD;  Location: [facility] ENDOSCOPY;  Service: Gastroenterology;;   CYST GASTROSTOMY  11/14/2020   Procedure: CYST NECROSECTOMY;  Surgeon: Wilhelmenia Aloha Raddle., MD;  Location: [facility] ENDOSCOPY;  Service: Gastroenterology;;   CYST GASTROSTOMY  06/16/2022   Procedure: CYST GASTROSTOMY;  Surgeon: Wilhelmenia Aloha Raddle., MD;  Location: [facility] ENDOSCOPY;  Service: Gastroenterology;;   CYST REMOVAL HAND     CYSTOSCOPY  06/26/2021   Procedure: CYSTOGASTROSTOMY;  Surgeon: Mansouraty, Aloha Raddle., MD;  Location: [facility] ENDOSCOPY;  Service: Gastroenterology;;   ENDOSCOPIC RETROGRADE CHOLANGIOPANCREATOGRAPHY (ERCP) WITH PROPOFOL  N/A 11/09/2020   Procedure: ENDOSCOPIC RETROGRADE CHOLANGIOPANCREATOGRAPHY (ERCP) WITH PROPOFOL ;  Surgeon: Wilhelmenia Aloha Raddle., MD;  Location: [facility] ENDOSCOPY;  Service: Gastroenterology;  Laterality: N/A;   ENDOSCOPIC RETROGRADE CHOLANGIOPANCREATOGRAPHY (ERCP) WITH PROPOFOL  N/A 12/20/2020   Procedure: ENDOSCOPIC RETROGRADE CHOLANGIOPANCREATOGRAPHY (ERCP) WITH PROPOFOL ;  Surgeon: Wilhelmenia Aloha Raddle., MD;  Location: [facility] ENDOSCOPY;  Service: Gastroenterology;  Laterality: N/A;   ENDOSCOPIC RETROGRADE CHOLANGIOPANCREATOGRAPHY (ERCP) WITH PROPOFOL  N/A 01/29/2021   Procedure: ENDOSCOPIC RETROGRADE CHOLANGIOPANCREATOGRAPHY (ERCP) WITH PROPOFOL ;  Surgeon: Wilhelmenia Aloha Raddle., MD;  Location: [facility] ENDOSCOPY;  Service: Gastroenterology;  Laterality: N/A;   ENDOSCOPIC RETROGRADE CHOLANGIOPANCREATOGRAPHY (ERCP) WITH PROPOFOL  N/A 07/24/2021   Procedure: ENDOSCOPIC RETROGRADE CHOLANGIOPANCREATOGRAPHY (ERCP) WITH PROPOFOL ;  Surgeon: Wilhelmenia Aloha Raddle., MD;  Location: [facility] ENDOSCOPY;  Service: Gastroenterology;  Laterality: N/A;   ENDOSCOPIC RETROGRADE CHOLANGIOPANCREATOGRAPHY (ERCP) WITH PROPOFOL  N/A  09/23/2023   Procedure: ENDOSCOPIC RETROGRADE CHOLANGIOPANCREATOGRAPHY (ERCP) WITH PROPOFOL ;  Surgeon: Wilhelmenia Aloha Raddle., MD;  Location: [facility] ENDOSCOPY;  Service: Gastroenterology;  Laterality: N/A;   ERCP N/A 10/10/2020   Procedure: ENDOSCOPIC RETROGRADE CHOLANGIOPANCREATOGRAPHY (ERCP);  Surgeon: Golda Claudis PENNER, MD;  Location: [facility];  Service: Endoscopy;  Laterality: N/A;   ERCP     ERCP N/A 05/22/2022   Procedure: ENDOSCOPIC RETROGRADE CHOLANGIOPANCREATOGRAPHY (ERCP);  Surgeon: Wilhelmenia Aloha Raddle., MD;  Location: [facility];  Service: Gastroenterology;  Laterality: N/A;   ESOPHAGOGASTRODUODENOSCOPY N/A 11/11/2020   Procedure: ESOPHAGOGASTRODUODENOSCOPY (EGD);  Surgeon: Wilhelmenia Aloha Raddle., MD;  Location: [facility] ENDOSCOPY;  Service: Gastroenterology;  Laterality: N/A;   ESOPHAGOGASTRODUODENOSCOPY N/A 11/14/2020   Procedure: ESOPHAGOGASTRODUODENOSCOPY (EGD);  Surgeon: Wilhelmenia Aloha Raddle., MD;  Location: [facility] ENDOSCOPY;  Service: Gastroenterology;  Laterality: N/A;   ESOPHAGOGASTRODUODENOSCOPY (EGD) WITH PROPOFOL  N/A 11/09/2020   Procedure: ESOPHAGOGASTRODUODENOSCOPY (EGD) WITH PROPOFOL ;  Surgeon: Wilhelmenia Aloha Raddle., MD;  Location: [facility] ENDOSCOPY;  Service: Gastroenterology;  Laterality: N/A;   ESOPHAGOGASTRODUODENOSCOPY (EGD) WITH PROPOFOL  N/A 11/07/2020   Procedure: ESOPHAGOGASTRODUODENOSCOPY (EGD) WITH PROPOFOL ;  Surgeon: Wilhelmenia Aloha Raddle., MD;  Location: [facility] ENDOSCOPY;  Service: Gastroenterology;  Laterality: N/A;   ESOPHAGOGASTRODUODENOSCOPY (EGD) WITH PROPOFOL  N/A 12/20/2020   Procedure: ESOPHAGOGASTRODUODENOSCOPY (EGD) WITH PROPOFOL ;  Surgeon: Wilhelmenia Aloha Raddle., MD;  Location: [facility] ENDOSCOPY;  Service: Gastroenterology;  Laterality: N/A;   ESOPHAGOGASTRODUODENOSCOPY (EGD) WITH PROPOFOL  N/A 06/26/2021   Procedure: ESOPHAGOGASTRODUODENOSCOPY (EGD) WITH PROPOFOL ;  Surgeon: Wilhelmenia Aloha Raddle., MD;  Location: [facility] ENDOSCOPY;  Service: Gastroenterology;  Laterality:  N/A;   ESOPHAGOGASTRODUODENOSCOPY (EGD) WITH PROPOFOL  N/A 07/24/2021   Procedure: ESOPHAGOGASTRODUODENOSCOPY (EGD) WITH PROPOFOL ;  Surgeon: Wilhelmenia Aloha Raddle., MD;  Location: [facility] ENDOSCOPY;  Service: Gastroenterology;  Laterality: N/A;  AXIOS STENT   ESOPHAGOGASTRODUODENOSCOPY (EGD) WITH PROPOFOL  N/A 05/22/2022   Procedure: ESOPHAGOGASTRODUODENOSCOPY (EGD) WITH PROPOFOL ;  Surgeon: Wilhelmenia Aloha Raddle., MD;  Location: [facility] ENDOSCOPY;  Service: Gastroenterology;  Laterality: N/A;   ESOPHAGOGASTRODUODENOSCOPY (EGD) WITH PROPOFOL  N/A 06/16/2022   Procedure: ESOPHAGOGASTRODUODENOSCOPY (EGD) WITH PROPOFOL ;  Surgeon: Wilhelmenia Aloha Raddle., MD;  Location: [facility];  Service: Gastroenterology;  Laterality: N/A;   ESOPHAGOGASTRODUODENOSCOPY (EGD) WITH PROPOFOL  N/A 07/30/2022   Procedure: ESOPHAGOGASTRODUODENOSCOPY (EGD) WITH PROPOFOL ;  Surgeon: Wilhelmenia Aloha Raddle., MD;  Location: [facility];  Service: Gastroenterology;  Laterality: N/A;   ESOPHAGOGASTRODUODENOSCOPY (EGD) WITH PROPOFOL  N/A 03/30/2023   Procedure: ESOPHAGOGASTRODUODENOSCOPY (EGD) WITH PROPOFOL ;  Surgeon: Avram Lupita BRAVO, MD;  Location: [facility] ENDOSCOPY;  Service: Gastroenterology;  Laterality: N/A;   EUS  11/14/2020   Procedure: UPPER ENDOSCOPIC ULTRASOUND (EUS) LINEAR;  Surgeon: Wilhelmenia Aloha Raddle., MD;  Location: [facility] ENDOSCOPY;  Service: Gastroenterology;;   EUS N/A 06/26/2021   Procedure: UPPER ENDOSCOPIC ULTRASOUND (EUS) RADIAL;  Surgeon: Wilhelmenia Aloha Raddle., MD;  Location: [facility] ENDOSCOPY;  Service: Gastroenterology;  Laterality: N/A;   EUS N/A 05/22/2022   Procedure: UPPER ENDOSCOPIC ULTRASOUND (EUS) LINEAR;  Surgeon: Wilhelmenia Aloha Raddle., MD;  Location: [facility] ENDOSCOPY;  Service: Gastroenterology;  Laterality: N/A;   EUS N/A 06/16/2022   Procedure: UPPER ENDOSCOPIC ULTRASOUND (EUS) LINEAR;  Surgeon: Wilhelmenia Aloha Raddle., MD;  Location: [facility] ENDOSCOPY;  Service: Gastroenterology;  Laterality: N/A;   FINE NEEDLE  ASPIRATION  05/22/2022   Procedure: FINE NEEDLE ASPIRATION;  Surgeon: Wilhelmenia Aloha Raddle., MD;  Location: [facility];  Service: Gastroenterology;;   GASTROINTESTINAL STENT REMOVAL  12/20/2020   Procedure: GASTROINTESTINAL STENT REMOVAL;  Surgeon: Wilhelmenia Aloha Raddle., MD;  Location: [facility] ENDOSCOPY;  Service: Gastroenterology;;  cyst gastrostomy stent and double pig tail stents x2 removed   HERNIA REPAIR Left 2002   groin   INGUINAL HERNIA REPAIR Left 04/05/2017   Procedure: RECURRENT HERNIA REPAIR INGUINAL ADULT WITH MESH;  Surgeon: Mavis Anes, MD;  Location: [facility];  Service: General;  Laterality: Left;   MASS EXCISION Right 04/29/2020   Procedure: EXCISION MASS RIGHT WRIST;  Surgeon: Murrell Drivers, MD;  Location: [facility];  Service: Orthopedics;  Laterality: Right;   MASS EXCISION Right 10/09/2020   Procedure: EXCISION MASS, ABDOMINAL WALL, 2CM;  Surgeon: Kallie Manuelita BROCKS, MD;  Location: [facility];  Service: General;  Laterality: Right;   PANCREATIC STENT PLACEMENT  06/26/2021   Procedure: AXIOS STENT PLACEMENT;  Surgeon: Wilhelmenia Aloha Raddle., MD;  Location: [facility] ENDOSCOPY;  Service: Gastroenterology;;   PANCREATIC STENT PLACEMENT  06/16/2022   Procedure: PANCREATIC STENT PLACEMENT;  Surgeon: Wilhelmenia Aloha Raddle., MD;  Location: [facility];  Service: Gastroenterology;;   REMOVAL OF STONES  12/20/2020   Procedure: REMOVAL OF STONES;  Surgeon: Wilhelmenia Aloha Raddle., MD;  Location: [facility];  Service: Gastroenterology;;   REMOVAL OF STONES  07/24/2021   Procedure: REMOVAL OF STONES;  Surgeon: Wilhelmenia Aloha Raddle., MD;  Location: [facility] ENDOSCOPY;  Service: Gastroenterology;;   REMOVAL OF STONES  05/22/2022   Procedure: REMOVAL OF STONES;  Surgeon: Wilhelmenia Aloha Raddle., MD;  Location: [facility];  Service: Gastroenterology;;   ANNETT N/A 10/10/2020   Procedure: ANNETT;  Surgeon: Golda Claudis PENNER, MD;  Location: [facility];  Service: Endoscopy;   Laterality: N/A;   SPHINCTEROTOMY  07/24/2021   Procedure: SPHINCTEROTOMY;  Surgeon: Mansouraty, Aloha Raddle., MD;  Location: [facility] ENDOSCOPY;  Service: Gastroenterology;;   SPLENECTOMY, TOTAL N/A 11/17/2022   Procedure: SPLENECTOMY;  Surgeon: Dasie Leonor CROME, MD;  Location: [facility] OR;  Service: General;  Laterality: N/A;   SPYGLASS CHOLANGIOSCOPY N/A 12/20/2020   Procedure: DEBHOJDD CHOLANGIOSCOPY;  Surgeon: Wilhelmenia Aloha Raddle., MD;  Location: [facility];  Service: Gastroenterology;  Laterality: N/A;   SPYGLASS CHOLANGIOSCOPY N/A 07/24/2021   Procedure: SPYGLASS CHOLANGIOSCOPY;  Surgeon: Wilhelmenia,  Aloha Raddle., MD;  Location: [facility] ENDOSCOPY;  Service: Gastroenterology;  Laterality: N/A;   STENT REMOVAL  11/09/2020   Procedure: STENT REMOVAL;  Surgeon: Wilhelmenia Aloha Raddle., MD;  Location: [facility] ENDOSCOPY;  Service: Gastroenterology;;   CLEDA REMOVAL  11/11/2020   Procedure: STENT REMOVAL;  Surgeon: Wilhelmenia Aloha Raddle., MD;  Location: [facility] ENDOSCOPY;  Service: Gastroenterology;;   CLEDA REMOVAL  11/14/2020   Procedure: STENT REMOVAL;  Surgeon: Wilhelmenia Aloha Raddle., MD;  Location: [facility] ENDOSCOPY;  Service: Gastroenterology;;   CLEDA REMOVAL  12/20/2020   Procedure: STENT REMOVAL;  Surgeon: Wilhelmenia Aloha Raddle., MD;  Location: [facility];  Service: Gastroenterology;;  biliary x2   STENT REMOVAL  07/24/2021   Procedure: AXIOS STENT REMOVAL;  Surgeon: Wilhelmenia Aloha Raddle., MD;  Location: [facility] ENDOSCOPY;  Service: Gastroenterology;;   CLEDA REMOVAL  07/30/2022   Procedure: STENT REMOVAL;  Surgeon: Wilhelmenia Aloha Raddle., MD;  Location: [facility];  Service: Gastroenterology;;   UPPER ESOPHAGEAL ENDOSCOPIC ULTRASOUND (EUS) N/A 11/07/2020   Procedure: UPPER ESOPHAGEAL ENDOSCOPIC ULTRASOUND (EUS);  Surgeon: Wilhelmenia Aloha Raddle., MD;  Location: [facility] ENDOSCOPY;  Service: Gastroenterology;  Laterality: N/A;   UPPER GASTROINTESTINAL ENDOSCOPY     WOUND DEBRIDEMENT  11/09/2020   Procedure: CYST  NECROSECTOMY;  Surgeon: Wilhelmenia Aloha Raddle., MD;  Location: [facility] ENDOSCOPY;  Service: Gastroenterology;;    Family History family history includes Diabetes in his father; Heart disease in his father and maternal grandmother; Stroke in his maternal grandfather.  Social History Social History   Socioeconomic History   Marital status: Single    Spouse name: Not on file   Number of children: 0   Years of education: Not on file   Highest education level: GED or equivalent  Occupational History   Not on file  Tobacco Use   Smoking status: Every Day    Current packs/day: 0.25    Average packs/day: 0.3 packs/day for 26.0 years (6.5 ttl pk-yrs)    Types: Cigarettes   Smokeless tobacco: Never   Tobacco comments:    4-5 cig daily as of 10/07/2020.  Vaping Use   Vaping status: Never Used  Substance and Sexual Activity   Alcohol use: No   Drug use: No   Sexual activity: Never    Birth control/protection: None  Other Topics Concern   Not on file  Social History Narrative   Not on file   Social Drivers of Health   Financial Resource Strain: Low Risk  (07/27/2023)   Overall Financial Resource Strain (CARDIA)    Difficulty of Paying Living Expenses: Not very hard  Food Insecurity: No Food Insecurity (08/16/2023)   Hunger Vital Sign    Worried About Running Out of Food in the Last Year: Never true    Ran Out of Food in the Last Year: Never true  Recent Concern: Food Insecurity - Food Insecurity Present (07/27/2023)   Hunger Vital Sign    Worried About Running Out of Food in the Last Year: Never true    Ran Out of Food in the Last Year: Sometimes true  Transportation Needs: No Transportation Needs (08/16/2023)   PRAPARE - Administrator, Civil Service (Medical): No    Lack of Transportation (Non-Medical): No  Physical Activity: Insufficiently Active (07/27/2023)   Exercise Vital Sign    Days of Exercise per Week: 2 days    Minutes of Exercise per Session: 10 min  Stress:  No Stress Concern Present (07/27/2023)   [employer] of Occupational Health - Occupational Stress  Questionnaire    Feeling of Stress : Only a little  Social Connections: Unknown (07/27/2023)   Social Connection and Isolation Panel    Frequency of Communication with Friends and Family: Three times a week    Frequency of Social Gatherings with Friends and Family: Once a week    Attends Religious Services: Patient declined    Active Member of Clubs or Organizations: No    Attends Banker Meetings: Not on file    Marital Status: Divorced  Intimate Partner Violence: Not At Risk (08/16/2023)   Humiliation, Afraid, Rape, and Kick questionnaire    Fear of Current or Ex-Partner: No    Emotionally Abused: No    Physically Abused: No    Sexually Abused: No    Lab Results  Component Value Date   HGBA1C 10.5 (A) 11/16/2023   HGBA1C 13.7 (H) 09/14/2023   HGBA1C >14.0 (H) 06/08/2023   Lab Results  Component Value Date   CHOL 158 09/14/2023   Lab Results  Component Value Date   HDL 44 09/14/2023   Lab Results  Component Value Date   LDLCALC 98 09/14/2023   Lab Results  Component Value Date   TRIG 72 09/14/2023   Lab Results  Component Value Date   CHOLHDL 3.6 09/14/2023   Lab Results  Component Value Date   CREATININE 0.86 12/02/2023   Lab Results  Component Value Date   GFR 99.32 12/02/2023   Lab Results  Component Value Date   MICROALBUR 0.5 09/14/2023      Component Value Date/Time   NA 139 12/02/2023 0811   NA 135 08/18/2023 1205   K 4.4 12/02/2023 0811   CL 102 12/02/2023 0811   CO2 29 12/02/2023 0811   GLUCOSE 188 (H) 12/02/2023 0811   BUN 15 12/02/2023 0811   BUN 15 08/18/2023 1205   CREATININE 0.86 12/02/2023 0811   CREATININE 0.79 09/14/2023 0940   CALCIUM  9.2 12/02/2023 0811   PROT 6.7 12/02/2023 0811   PROT 5.7 (L) 08/18/2023 1205   ALBUMIN  4.2 12/02/2023 0811   ALBUMIN  3.7 (L) 08/18/2023 1205   AST 19 12/02/2023 0811   ALT 29  12/02/2023 0811   ALKPHOS 130 (H) 12/02/2023 0811   BILITOT 0.4 12/02/2023 0811   BILITOT <0.2 08/18/2023 1205   GFRNONAA >60 08/13/2023 0450   GFRNONAA 89 04/22/2015 0828   GFRAA 85 10/04/2019 1433   GFRAA >89 04/22/2015 0828      Latest Ref Rng & Units 12/02/2023    8:11 AM 09/14/2023    9:40 AM 08/18/2023   12:05 PM  BMP  Glucose 70 - 99 mg/dL 811  697  790   BUN 6 - 23 mg/dL 15  16  15    Creatinine 0.40 - 1.50 mg/dL 9.13  9.20  9.24   BUN/Creat Ratio 6 - 22 (calc)  SEE NOTE:  20   Sodium 135 - 145 mEq/L 139  136  135   Potassium 3.5 - 5.1 mEq/L 4.4  4.8  4.7   Chloride 96 - 112 mEq/L 102  98  95   CO2 19 - 32 mEq/L 29  32  26   Calcium  8.4 - 10.5 mg/dL 9.2  9.7  9.4        Component Value Date/Time   WBC 14.1 (H) 12/02/2023 0811   RBC 5.12 12/02/2023 0811   HGB 15.9 12/02/2023 0811   HGB 13.8 08/18/2023 1205   HCT 46.8 12/02/2023 0811   HCT 41.0 08/18/2023  1205   PLT 429.0 (H) 12/02/2023 0811   PLT 484 (H) 08/18/2023 1205   MCV 91.4 12/02/2023 0811   MCV 94 08/18/2023 1205   MCH 31.7 08/18/2023 1205   MCH 31.7 08/12/2023 0257   MCHC 34.1 12/02/2023 0811   RDW 13.9 12/02/2023 0811   RDW 12.4 08/18/2023 1205   LYMPHSABS 3.7 (H) 08/18/2023 1205   MONOABS 0.7 07/07/2023 1310   EOSABS 0.1 08/18/2023 1205   BASOSABS 0.1 08/18/2023 1205     Parts of this note may have been dictated using voice recognition software. There may be variances in spelling and vocabulary which are unintentional. Not all errors are proofread. Please notify the dino if any discrepancies are noted or if the meaning of any statement is not clear.

## 2024-01-03 NOTE — Patient Instructions (Signed)
 Will recommend the following: Synjardy  XR 11/998 mg bid  Semglee  40 units qam Novolog  10 units tid 15 min before meals Correction scale: Use in addition to your meal time/short acting insulin (both together) based on blood sugars as follows:  151 - 180: 1 unit 181 - 210: 2 units 211 - 240: 3 units 241 - 270: 4 units 271 - 300: 5 units 301 - 330: 6 units 331 - 360: 7 units 361 - 390: 8 units 391 - 420/High : 9 units

## 2024-01-19 ENCOUNTER — Encounter: Payer: Self-pay | Admitting: Podiatry

## 2024-01-19 ENCOUNTER — Ambulatory Visit (INDEPENDENT_AMBULATORY_CARE_PROVIDER_SITE_OTHER): Payer: MEDICAID | Admitting: Podiatry

## 2024-01-19 VITALS — BP 118/78 | HR 57 | Temp 97.8°F | Resp 16 | Ht 70.0 in | Wt 181.0 lb

## 2024-01-19 DIAGNOSIS — L600 Ingrowing nail: Secondary | ICD-10-CM | POA: Diagnosis not present

## 2024-01-19 NOTE — Progress Notes (Signed)
 Subjective:   Patient ID: Steve Romero, male   DOB: 53 y.o.   MRN: [id number]   HPI Patient presents concerned about ingrown toenails both feet with patient in relatively poor health and still smoking a moderate nature.  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  At this point patient will be seen back as needed all questions were answered and we discussed the permanent procedure that may be necessary someday

## 2024-02-01 ENCOUNTER — Encounter: Payer: Self-pay | Admitting: "Endocrinology

## 2024-02-01 ENCOUNTER — Telehealth: Payer: Self-pay

## 2024-02-01 ENCOUNTER — Ambulatory Visit (INDEPENDENT_AMBULATORY_CARE_PROVIDER_SITE_OTHER): Payer: MEDICAID | Admitting: "Endocrinology

## 2024-02-01 ENCOUNTER — Other Ambulatory Visit (HOSPITAL_COMMUNITY): Payer: Self-pay

## 2024-02-01 VITALS — BP 104/80 | HR 75 | Ht 70.0 in | Wt 175.0 lb

## 2024-02-01 DIAGNOSIS — Z794 Long term (current) use of insulin: Secondary | ICD-10-CM

## 2024-02-01 DIAGNOSIS — Z7984 Long term (current) use of oral hypoglycemic drugs: Secondary | ICD-10-CM | POA: Diagnosis not present

## 2024-02-01 DIAGNOSIS — E11649 Type 2 diabetes mellitus with hypoglycemia without coma: Secondary | ICD-10-CM

## 2024-02-01 DIAGNOSIS — E7800 Pure hypercholesterolemia, unspecified: Secondary | ICD-10-CM | POA: Diagnosis not present

## 2024-02-01 LAB — POCT GLYCOSYLATED HEMOGLOBIN (HGB A1C): Hemoglobin A1C: 11.3 % — AB (ref 4.0–5.6)

## 2024-02-01 MED ORDER — SEMGLEE (YFGN) 100 UNIT/ML ~~LOC~~ SOPN
50.0000 [IU] | PEN_INJECTOR | Freq: Every day | SUBCUTANEOUS | 2 refills | Status: DC
Start: 2024-02-01 — End: 2024-02-04

## 2024-02-01 NOTE — Patient Instructions (Signed)
 Will recommend the following: Synjardy  XR 11/998 mg qd  Semglee  50-60 units qam Novolog  10 units tid 15 min before meals Correction scale: Use in addition to your meal time/short acting insulin  (both together) based on blood sugars as follows:  151 - 180: 1 unit 181 - 210: 2 units 211 - 240: 3 units 241 - 270: 4 units 271 - 300: 5 units 301 - 330: 6 units 331 - 360: 7 units 361 - 390: 8 units 391 - 420/High : 9 units

## 2024-02-01 NOTE — Progress Notes (Signed)
 Outpatient Endocrinology Note Steve Birmingham, MD  02/01/24   Steve Romero 53-Jan-1972 [id number]  Referring Provider: Jolinda Norene HERO, DO Primary Care Provider: Jolinda Norene HERO, DO Reason for consultation: Subjective   Assessment & Plan  Diagnoses and all orders for this visit:  Uncontrolled type 2 diabetes mellitus with hypoglycemia without coma (HCC) -     POCT glycosylated hemoglobin (Hb A1C) -     SEMGLEE , YFGN, 100 UNIT/ML Pen; Inject 50-60 Units into the skin daily.  Long term (current) use of oral hypoglycemic drugs  Long-term insulin  use (HCC)  Pure hypercholesterolemia    Diabetes Type II complicated by neuropathy,  Lab Results  Component Value Date   GFR 99.32 12/02/2023   Hba1c goal less than 7, current Hba1c is  Lab Results  Component Value Date   HGBA1C 11.3 (A) 02/01/2024   Will recommend the following: Synjardy  XR 11/998 mg qd  Semglee  50-60 units qam to target fasting blood sugar at 100-150 Novolog  10 units tid 15 min before meals Correction scale: Use in addition to your meal time/short acting insulin  (both together) based on blood sugars as follows:  151 - 180: 1 unit 181 - 210: 2 units 211 - 240: 3 units 241 - 270: 4 units 271 - 300: 5 units 301 - 330: 6 units 331 - 360: 7 units 361 - 390: 8 units 391 - 420/High : 9 units   11/09/23: Ordered diabetes education that patient got today, pt is drinking sweet drinks all day leading to high sugars, was not taking 20 units but only correction scale, had a low in 10/2023, discussed hypoglycemia management again, with plans to try CeQur for being compliant with short acting insulin  as well as sticking to 1 drink a day instead of drinking all day the sugary beverages 11/16/23 patient again got diabetes education today.  Patient repeatedly says that he has been doing everything right, taking up to 6 shots of insulin  a: day, 2 of the Semglee  and up to 4 of the NovoLog  15 units with meals.  His  blood sugars continue to stay high except rarely knows that patient keeps telling because of his pancreas.  Reinforced compliance and discussed the dangers of continuing dose elevation of Novolog  in case of noncompliance, patient understands and agreed 12/15/23: seen DM educator, using insulin  in scar tissue, drinking juices and mountain dew  01/03/24: ordered podiatry referral, r/o onychomycosis; discussed again about decreasing smoking  No known contraindications/side effects to any of above medications Glucagon  discussed and prescribed with refills on 09/2023  -Last LD and Tg are as follows: Lab Results  Component Value Date   LDLCALC 98 09/14/2023    Lab Results  Component Value Date   TRIG 72 09/14/2023   -stopped rosuvastatin  10 mg every day, reinforced it previously, now taking it  -Follow low fat diet and exercise   -Blood pressure goal <140/90 - Microalbumin/creatinine goal is < 30 -Last MA/Cr is as follows: Lab Results  Component Value Date   MICROALBUR 0.5 09/14/2023   -not on ACE/ARB  -diet changes including salt restriction -limit eating outside -counseled BP targets per standards of diabetes care -uncontrolled blood pressure can lead to retinopathy, nephropathy and cardiovascular and atherosclerotic heart disease  Reviewed and counseled on: -A1C target -Blood sugar targets -Complications of uncontrolled diabetes  -Checking blood sugar before meals and bedtime and bring log next visit -All medications with mechanism of action and side effects -Hypoglycemia management: rule of 15's, Glucagon  Emergency  Kit and medical alert ID -low-carb low-fat plate-method diet -At least 20 minutes of physical activity per day -Annual dilated retinal eye exam and foot exam -compliance and follow up needs -follow up as scheduled or earlier if problem gets worse  Call if blood sugar is less than 70 or consistently above 250    Take a 15 gm snack of carbohydrate at bedtime before  you go to sleep if your blood sugar is less than 100.    If you are going to fast after midnight for a test or procedure, ask your physician for instructions on how to reduce/decrease your insulin  dose.    Call if blood sugar is less than 70 or consistently above 250  -Treating a low sugar by rule of 15  (15 gms of sugar every 15 min until sugar is more than 70) If you feel your sugar is low, test your sugar to be sure If your sugar is low (less than 70), then take 15 grams of a fast acting Carbohydrate (3-4 glucose tablets or glucose gel or 4 ounces of juice or regular soda) Recheck your sugar 15 min after treating low to make sure it is more than 70 If sugar is still less than 70, treat again with 15 grams of carbohydrate          Don't drive the hour of hypoglycemia  If unconscious/unable to eat or drink by mouth, use glucagon  injection or nasal spray baqsimi  and call 911. Can repeat again in 15 min if still unconscious.  Return in about 4 weeks (around 02/29/2024).   I have reviewed current medications, nurse's notes, allergies, vital signs, past medical and surgical history, family medical history, and social history for this encounter. Counseled patient on symptoms, examination findings, lab findings, imaging results, treatment decisions and monitoring and prognosis. The patient understood the recommendations and agrees with the treatment plan. All questions regarding treatment plan were fully answered.  Steve Birmingham, MD  02/01/24   History of Present Illness Steve Romero is a 53 y.o. year old male who presents for follow up of Type II diabetes mellitus.  Home diabetes regimen: Synjardy  XR 11/998 mg every day-cannot tolerate bid  Semglee  40 units qd Novolog  10 units 15-20 min tidac + sliding scale   COMPLICATIONS -  MI/Stroke -  retinopathy +  neuropathy -  nephropathy  BLOOD SUGAR DATA CGM interpretation: At today's visit, we reviewed her CGM downloads. The full  report is scanned in the media. Reviewing the CGM trends, BG are elevated across the day.  Physical Exam  BP 104/80   Pulse 75   Ht 5' 10 (1.778 m)   Wt 175 lb (79.4 kg)   SpO2 97%   BMI 25.11 kg/m    Constitutional: well developed, well nourished Head: normocephalic, atraumatic Eyes: sclera anicteric, no redness Neck: supple Lungs: normal respiratory effort Neurology: alert and oriented Skin: dry, no appreciable rashes Musculoskeletal: no appreciable defects Psychiatric: normal mood and affect Diabetic Foot Exam - Simple   No data filed      Current Medications Patient's Medications  New Prescriptions   No medications on file  Previous Medications   ACCU-CHEK SOFTCLIX LANCETS LANCETS    Check BS in the morning and at night Dx E11.9   ACETAMINOPHEN  (TYLENOL ) 500 MG TABLET    Take 2 tablets (1,000 mg total) by mouth every 8 (eight) hours as needed for mild pain (pain score 1-3).   ALBUTEROL  (VENTOLIN  HFA) 108 (90 BASE) MCG/ACT  INHALER    Inhale 2 puffs into the lungs every 6 (six) hours as needed for wheezing or shortness of breath.   ASPIRIN  EC 81 MG TABLET    Take 81-162 mg by mouth daily as needed (for pain). Swallow whole.   CONTINUOUS BLOOD GLUC RECEIVER (FREESTYLE LIBRE READER) DEVI    1 Units by Does not apply route daily. UAD to test BGs daily. Dx E11.9   CONTINUOUS GLUCOSE RECEIVER (FREESTYLE LIBRE 3 READER) DEVI    1 Device by Does not apply route continuous.   CONTINUOUS GLUCOSE SENSOR (FREESTYLE LIBRE 3 PLUS SENSOR) MISC    Check BGs continuously. E11.9 Change sensor every 15 days.   EMPAGLIFLOZIN-METFORMIN HCL ER (SYNJARDY  XR) 11-998 MG TB24    Take 1 tablet by mouth 2 (two) times daily.   GLUCAGON  (BAQSIMI  ONE PACK) 3 MG/DOSE POWD    Place 1 Device into the nose as needed (Low blood sugar with impaired consciousness).   GLUCOSE BLOOD (ACCU-CHEK GUIDE TEST) TEST STRIP    Check BS in the morning and at night Dx E11.9   INSULIN  ASPART (NOVOLOG  FLEXPEN) 100 UNIT/ML  FLEXPEN    10 units tidac + sliding scale, max dose 60 units a day   INSULIN  PEN NEEDLE (BD PEN NEEDLE NANO 2ND GEN) 32G X 4 MM MISC    UAD to administer insulin  Dx E11.10   LIPASE/PROTEASE/AMYLASE (CREON ) 36000 UNITS CPEP CAPSULE    Take 3 capsules with meals and 1-2  capsule with snacks ( up too 2 snacks )   NALOXONE  (NARCAN ) NASAL SPRAY 4 MG/0.1 ML    Place 1 spray into the nose as directed.   ONDANSETRON  (ZOFRAN ) 4 MG TABLET    Take 4 mg by mouth every 6 (six) hours as needed for nausea or vomiting.   OXYCODONE  HCL 10 MG TABS    Take 1 tablet (10 mg total) by mouth every 6 (six) hours as needed (for pain). To last 3 months   ROSUVASTATIN  (CRESTOR ) 10 MG TABLET    Take 1 tablet (10 mg total) by mouth daily.  Modified Medications   Modified Medication Previous Medication   SEMGLEE , YFGN, 100 UNIT/ML PEN SEMGLEE , YFGN, 100 UNIT/ML Pen      Inject 50-60 Units into the skin daily.    Inject 40 Units into the skin daily.  Discontinued Medications   No medications on file    Allergies Allergies  Allergen Reactions   Desipramine Nausea Only and Rash    Past Medical History Past Medical History:  Diagnosis Date   Acute pancreatitis without necrosis or infection, unspecified    AKI (acute kidney injury) (HCC) 05/17/2022   Anxiety 10/2014   Ascites    Bipolar disorder (HCC) age 65   COPD (chronic obstructive pulmonary disease) (HCC) 2016   Depression age 59   DKA (diabetic ketoacidosis) (HCC) 08/11/2023   Enlarged prostate    Hyperlipidemia 2016   Hypertension 2013   Nausea vomiting and diarrhea 06/15/2022   Schizoaffective disorder (HCC) 11/13/2014   Sleep apnea    Does not wear c-pap, sleeps in sitting up position per pt   Thyroid  disease 11/2014    Past Surgical History Past Surgical History:  Procedure Laterality Date   BALLOON DILATION N/A 11/07/2020   Procedure: BALLOON DILATION;  Surgeon: Wilhelmenia Aloha Raddle., MD;  Location: [facility] ENDOSCOPY;  Service: Gastroenterology;   Laterality: N/A;   BALLOON DILATION N/A 06/26/2021   Procedure: BALLOON DILATION;  Surgeon: Wilhelmenia Aloha Raddle., MD;  Location:  [facility] ENDOSCOPY;  Service: Gastroenterology;  Laterality: N/A;   BILIARY BRUSHING  09/23/2023   Procedure: BRUSH BIOPSY, BILE DUCT;  Surgeon: Wilhelmenia Aloha Raddle., MD;  Location: [facility]COPY;  Service: Gastroenterology;;   BILIARY DILATION  07/24/2021   Procedure: BILIARY DILATION;  Surgeon: Wilhelmenia Aloha Raddle., MD;  Location: [facility] ENDOSCOPY;  Service: Gastroenterology;;   BILIARY DILATION  05/22/2022   Procedure: BILIARY DILATION;  Surgeon: Wilhelmenia Aloha Raddle., MD;  Location: [facility]OSCOPY;  Service: Gastroenterology;;   BILIARY DILATION  06/16/2022   Procedure: GASTROSTOMY DILATION;  Surgeon: Wilhelmenia Aloha Raddle., MD;  Location: [facility]OSCOPY;  Service: Gastroenterology;;   BILIARY STENT PLACEMENT N/A 10/10/2020   Procedure: BILIARY STENT PLACEMENT;  Surgeon: Golda Claudis PENNER, MD;  Location: [facility];  Service: Endoscopy;  Laterality: N/A;   BILIARY STENT PLACEMENT  11/09/2020   Procedure: BILIARY STENT PLACEMENT;  Surgeon: Wilhelmenia Aloha Raddle., MD;  Location: [facility] ENDOSCOPY;  Service: Gastroenterology;;   BILIARY STENT PLACEMENT  11/07/2020   Procedure: BILIARY STENT PLACEMENT;  Surgeon: Wilhelmenia Aloha Raddle., MD;  Location: [facility] ENDOSCOPY;  Service: Gastroenterology;;   BILIARY STENT PLACEMENT  11/11/2020   Procedure: BILIARY STENT PLACEMENT;  Surgeon: Wilhelmenia Aloha Raddle., MD;  Location: [facility] ENDOSCOPY;  Service: Gastroenterology;;   BILIARY STENT PLACEMENT  11/14/2020   Procedure: BILIARY STENT PLACEMENT;  Surgeon: Wilhelmenia Aloha Raddle., MD;  Location: [facility] ENDOSCOPY;  Service: Gastroenterology;;   BILIARY STENT PLACEMENT N/A 01/29/2021   Procedure: BILIARY STENT PLACEMENT;  Surgeon: Wilhelmenia Aloha Raddle., MD;  Location: [facility];  Service: Gastroenterology;  Laterality: N/A;   BILIARY STENT PLACEMENT  06/26/2021   Procedure: BILIARY STENT  PLACEMENT;  Surgeon: Wilhelmenia Aloha Raddle., MD;  Location: [facility] ENDOSCOPY;  Service: Gastroenterology;;   BILIARY STENT PLACEMENT  07/24/2021   Procedure: BILIARY STENT PLACEMENT;  Surgeon: Wilhelmenia Aloha Raddle., MD;  Location: [facility] ENDOSCOPY;  Service: Gastroenterology;;   BILIARY STENT PLACEMENT N/A 09/23/2023   Procedure: INSERTION, STENT, BILE DUCT;  Surgeon: Wilhelmenia Aloha Raddle., MD;  Location: [facility] ENDOSCOPY;  Service: Gastroenterology;  Laterality: N/A;   BIOPSY  11/07/2020   Procedure: BIOPSY;  Surgeon: Wilhelmenia Aloha Raddle., MD;  Location: [facility] ENDOSCOPY;  Service: Gastroenterology;;   BIOPSY  12/20/2020   Procedure: BIOPSY;  Surgeon: Wilhelmenia Aloha Raddle., MD;  Location: [facility];  Service: Gastroenterology;;   BIOPSY  06/26/2021   Procedure: BIOPSY;  Surgeon: Wilhelmenia Aloha Raddle., MD;  Location: [facility] ENDOSCOPY;  Service: Gastroenterology;;   CARPAL TUNNEL RELEASE Left 02/21/2016   Procedure: CARPAL TUNNEL RELEASE;  Surgeon: Taft FORBES Minerva, MD;  Location: [facility];  Service: Orthopedics;  Laterality: Left;   CHOLECYSTECTOMY N/A 10/09/2020   Procedure: LAPAROSCOPIC CHOLECYSTECTOMY;  Surgeon: Kallie Manuelita BROCKS, MD;  Location: [facility];  Service: General;  Laterality: N/A;   CYST ENTEROSTOMY N/A 11/07/2020   Procedure: CYST ENTEROSTOMY;  Surgeon: Wilhelmenia Aloha Raddle., MD;  Location: [facility] ENDOSCOPY;  Service: Gastroenterology;  Laterality: N/A;   CYST GASTROSTOMY  11/11/2020   Procedure: CYST NECROSECTOMY;  Surgeon: Wilhelmenia Aloha Raddle., MD;  Location: [facility] ENDOSCOPY;  Service: Gastroenterology;;   CYST GASTROSTOMY  11/14/2020   Procedure: CYST NECROSECTOMY;  Surgeon: Wilhelmenia Aloha Raddle., MD;  Location: [facility] ENDOSCOPY;  Service: Gastroenterology;;   CYST GASTROSTOMY  06/16/2022   Procedure: CYST GASTROSTOMY;  Surgeon: Wilhelmenia Aloha Raddle., MD;  Location: [facility] ENDOSCOPY;  Service: Gastroenterology;;   CYST REMOVAL HAND     CYSTOSCOPY  06/26/2021   Procedure:  CYSTOGASTROSTOMY;  Surgeon: Mansouraty, Aloha Raddle., MD;  Location: [facility] ENDOSCOPY;  Service:  Gastroenterology;;   ENDOSCOPIC RETROGRADE CHOLANGIOPANCREATOGRAPHY (ERCP) WITH PROPOFOL  N/A 11/09/2020   Procedure: ENDOSCOPIC RETROGRADE CHOLANGIOPANCREATOGRAPHY (ERCP) WITH PROPOFOL ;  Surgeon: Wilhelmenia Aloha Raddle., MD;  Location: [facility] ENDOSCOPY;  Service: Gastroenterology;  Laterality: N/A;   ENDOSCOPIC RETROGRADE CHOLANGIOPANCREATOGRAPHY (ERCP) WITH PROPOFOL  N/A 12/20/2020   Procedure: ENDOSCOPIC RETROGRADE CHOLANGIOPANCREATOGRAPHY (ERCP) WITH PROPOFOL ;  Surgeon: Wilhelmenia Aloha Raddle., MD;  Location: [facility] ENDOSCOPY;  Service: Gastroenterology;  Laterality: N/A;   ENDOSCOPIC RETROGRADE CHOLANGIOPANCREATOGRAPHY (ERCP) WITH PROPOFOL  N/A 01/29/2021   Procedure: ENDOSCOPIC RETROGRADE CHOLANGIOPANCREATOGRAPHY (ERCP) WITH PROPOFOL ;  Surgeon: Wilhelmenia Aloha Raddle., MD;  Location: [facility] ENDOSCOPY;  Service: Gastroenterology;  Laterality: N/A;   ENDOSCOPIC RETROGRADE CHOLANGIOPANCREATOGRAPHY (ERCP) WITH PROPOFOL  N/A 07/24/2021   Procedure: ENDOSCOPIC RETROGRADE CHOLANGIOPANCREATOGRAPHY (ERCP) WITH PROPOFOL ;  Surgeon: Wilhelmenia Aloha Raddle., MD;  Location: [facility] ENDOSCOPY;  Service: Gastroenterology;  Laterality: N/A;   ENDOSCOPIC RETROGRADE CHOLANGIOPANCREATOGRAPHY (ERCP) WITH PROPOFOL  N/A 09/23/2023   Procedure: ENDOSCOPIC RETROGRADE CHOLANGIOPANCREATOGRAPHY (ERCP) WITH PROPOFOL ;  Surgeon: Wilhelmenia Aloha Raddle., MD;  Location: [facility] ENDOSCOPY;  Service: Gastroenterology;  Laterality: N/A;   ERCP N/A 10/10/2020   Procedure: ENDOSCOPIC RETROGRADE CHOLANGIOPANCREATOGRAPHY (ERCP);  Surgeon: Golda Claudis PENNER, MD;  Location: [facility];  Service: Endoscopy;  Laterality: N/A;   ERCP     ERCP N/A 05/22/2022   Procedure: ENDOSCOPIC RETROGRADE CHOLANGIOPANCREATOGRAPHY (ERCP);  Surgeon: Wilhelmenia Aloha Raddle., MD;  Location: [facility];  Service: Gastroenterology;  Laterality: N/A;   ESOPHAGOGASTRODUODENOSCOPY N/A 11/11/2020    Procedure: ESOPHAGOGASTRODUODENOSCOPY (EGD);  Surgeon: Wilhelmenia Aloha Raddle., MD;  Location: [facility] ENDOSCOPY;  Service: Gastroenterology;  Laterality: N/A;   ESOPHAGOGASTRODUODENOSCOPY N/A 11/14/2020   Procedure: ESOPHAGOGASTRODUODENOSCOPY (EGD);  Surgeon: Wilhelmenia Aloha Raddle., MD;  Location: [facility] ENDOSCOPY;  Service: Gastroenterology;  Laterality: N/A;   ESOPHAGOGASTRODUODENOSCOPY (EGD) WITH PROPOFOL  N/A 11/09/2020   Procedure: ESOPHAGOGASTRODUODENOSCOPY (EGD) WITH PROPOFOL ;  Surgeon: Wilhelmenia Aloha Raddle., MD;  Location: [facility] ENDOSCOPY;  Service: Gastroenterology;  Laterality: N/A;   ESOPHAGOGASTRODUODENOSCOPY (EGD) WITH PROPOFOL  N/A 11/07/2020   Procedure: ESOPHAGOGASTRODUODENOSCOPY (EGD) WITH PROPOFOL ;  Surgeon: Wilhelmenia Aloha Raddle., MD;  Location: [facility] ENDOSCOPY;  Service: Gastroenterology;  Laterality: N/A;   ESOPHAGOGASTRODUODENOSCOPY (EGD) WITH PROPOFOL  N/A 12/20/2020   Procedure: ESOPHAGOGASTRODUODENOSCOPY (EGD) WITH PROPOFOL ;  Surgeon: Wilhelmenia Aloha Raddle., MD;  Location: [facility] ENDOSCOPY;  Service: Gastroenterology;  Laterality: N/A;   ESOPHAGOGASTRODUODENOSCOPY (EGD) WITH PROPOFOL  N/A 06/26/2021   Procedure: ESOPHAGOGASTRODUODENOSCOPY (EGD) WITH PROPOFOL ;  Surgeon: Wilhelmenia Aloha Raddle., MD;  Location: [facility] ENDOSCOPY;  Service: Gastroenterology;  Laterality: N/A;   ESOPHAGOGASTRODUODENOSCOPY (EGD) WITH PROPOFOL  N/A 07/24/2021   Procedure: ESOPHAGOGASTRODUODENOSCOPY (EGD) WITH PROPOFOL ;  Surgeon: Wilhelmenia Aloha Raddle., MD;  Location: [facility] ENDOSCOPY;  Service: Gastroenterology;  Laterality: N/A;  AXIOS STENT   ESOPHAGOGASTRODUODENOSCOPY (EGD) WITH PROPOFOL  N/A 05/22/2022   Procedure: ESOPHAGOGASTRODUODENOSCOPY (EGD) WITH PROPOFOL ;  Surgeon: Wilhelmenia Aloha Raddle., MD;  Location: [facility] ENDOSCOPY;  Service: Gastroenterology;  Laterality: N/A;   ESOPHAGOGASTRODUODENOSCOPY (EGD) WITH PROPOFOL  N/A 06/16/2022   Procedure: ESOPHAGOGASTRODUODENOSCOPY (EGD) WITH PROPOFOL ;  Surgeon: Wilhelmenia Aloha Raddle., MD;  Location: [facility] ENDOSCOPY;  Service: Gastroenterology;  Laterality: N/A;   ESOPHAGOGASTRODUODENOSCOPY (EGD) WITH PROPOFOL  N/A 07/30/2022   Procedure: ESOPHAGOGASTRODUODENOSCOPY (EGD) WITH PROPOFOL ;  Surgeon: Wilhelmenia Aloha Raddle., MD;  Location: [facility] ENDOSCOPY;  Service: Gastroenterology;  Laterality: N/A;   ESOPHAGOGASTRODUODENOSCOPY (EGD) WITH PROPOFOL  N/A 03/30/2023   Procedure: ESOPHAGOGASTRODUODENOSCOPY (EGD) WITH PROPOFOL ;  Surgeon: Avram Lupita BRAVO, MD;  Location: [facility] ENDOSCOPY;  Service: Gastroenterology;  Laterality: N/A;   EUS  11/14/2020   Procedure: UPPER ENDOSCOPIC ULTRASOUND (EUS) LINEAR;  Surgeon: Wilhelmenia Aloha Raddle., MD;  Location: [facility] ENDOSCOPY;  Service: Gastroenterology;;  EUS N/A 06/26/2021   Procedure: UPPER ENDOSCOPIC ULTRASOUND (EUS) RADIAL;  Surgeon: Wilhelmenia Aloha Raddle., MD;  Location: [facility] ENDOSCOPY;  Service: Gastroenterology;  Laterality: N/A;   EUS N/A 05/22/2022   Procedure: UPPER ENDOSCOPIC ULTRASOUND (EUS) LINEAR;  Surgeon: Wilhelmenia Aloha Raddle., MD;  Location: [facility] ENDOSCOPY;  Service: Gastroenterology;  Laterality: N/A;   EUS N/A 06/16/2022   Procedure: UPPER ENDOSCOPIC ULTRASOUND (EUS) LINEAR;  Surgeon: Wilhelmenia Aloha Raddle., MD;  Location: [facility] ENDOSCOPY;  Service: Gastroenterology;  Laterality: N/A;   FINE NEEDLE ASPIRATION  05/22/2022   Procedure: FINE NEEDLE ASPIRATION;  Surgeon: Wilhelmenia Aloha Raddle., MD;  Location: [facility];  Service: Gastroenterology;;   GASTROINTESTINAL STENT REMOVAL  12/20/2020   Procedure: GASTROINTESTINAL STENT REMOVAL;  Surgeon: Wilhelmenia Aloha Raddle., MD;  Location: [facility] ENDOSCOPY;  Service: Gastroenterology;;  cyst gastrostomy stent and double pig tail stents x2 removed   HERNIA REPAIR Left 2002   groin   INGUINAL HERNIA REPAIR Left 04/05/2017   Procedure: RECURRENT HERNIA REPAIR INGUINAL ADULT WITH MESH;  Surgeon: Mavis Anes, MD;  Location: [facility];  Service: General;  Laterality: Left;   MASS EXCISION Right  04/29/2020   Procedure: EXCISION MASS RIGHT WRIST;  Surgeon: Murrell Drivers, MD;  Location: [facility];  Service: Orthopedics;  Laterality: Right;   MASS EXCISION Right 10/09/2020   Procedure: EXCISION MASS, ABDOMINAL WALL, 2CM;  Surgeon: Kallie Manuelita BROCKS, MD;  Location: [facility];  Service: General;  Laterality: Right;   PANCREATIC STENT PLACEMENT  06/26/2021   Procedure: AXIOS STENT PLACEMENT;  Surgeon: Wilhelmenia Aloha Raddle., MD;  Location: [facility] ENDOSCOPY;  Service: Gastroenterology;;   PANCREATIC STENT PLACEMENT  06/16/2022   Procedure: PANCREATIC STENT PLACEMENT;  Surgeon: Wilhelmenia Aloha Raddle., MD;  Location: [facility];  Service: Gastroenterology;;   REMOVAL OF STONES  12/20/2020   Procedure: REMOVAL OF STONES;  Surgeon: Wilhelmenia Aloha Raddle., MD;  Location: [facility];  Service: Gastroenterology;;   REMOVAL OF STONES  07/24/2021   Procedure: REMOVAL OF STONES;  Surgeon: Wilhelmenia Aloha Raddle., MD;  Location: [facility] ENDOSCOPY;  Service: Gastroenterology;;   REMOVAL OF STONES  05/22/2022   Procedure: REMOVAL OF STONES;  Surgeon: Wilhelmenia Aloha Raddle., MD;  Location: [facility];  Service: Gastroenterology;;   ANNETT N/A 10/10/2020   Procedure: ANNETT;  Surgeon: Golda Claudis PENNER, MD;  Location: [facility];  Service: Endoscopy;  Laterality: N/A;   SPHINCTEROTOMY  07/24/2021   Procedure: SPHINCTEROTOMY;  Surgeon: Mansouraty, Aloha Raddle., MD;  Location: [facility] ENDOSCOPY;  Service: Gastroenterology;;   SPLENECTOMY, TOTAL N/A 11/17/2022   Procedure: SPLENECTOMY;  Surgeon: Dasie Leonor CROME, MD;  Location[facility] OR;  Service: General;  Laterality: N/A;   SPYGLASS CHOLANGIOSCOPY N/A 12/20/2020   Procedure: DEBHOJDD CHOLANGIOSCOPY;  Surgeon: Wilhelmenia Aloha Raddle., MD;  Location: [facility] ENDOSCOPY;  Service: Gastroenterology;  Laterality: N/A;   SPYGLASS CHOLANGIOSCOPY N/A 07/24/2021   Procedure: SPYGLASS CHOLANGIOSCOPY;  Surgeon: Wilhelmenia Aloha Raddle., MD;  Location: [facility]  ENDOSCOPY;  Service: Gastroenterology;  Laterality: N/A;   STENT REMOVAL  11/09/2020   Procedure: STENT REMOVAL;  Surgeon: Wilhelmenia Aloha Raddle., MD;  Location: [facility] ENDOSCOPY;  Service: Gastroenterology;;   CLEDA REMOVAL  11/11/2020   Procedure: STENT REMOVAL;  Surgeon: Wilhelmenia Aloha Raddle., MD;  Location: [facility] ENDOSCOPY;  Service: Gastroenterology;;   CLEDA REMOVAL  11/14/2020   Procedure: STENT REMOVAL;  Surgeon: Wilhelmenia Aloha Raddle., MD;  Location: [facility] ENDOSCOPY;  Service: Gastroenterology;;   CLEDA REMOVAL  12/20/2020   Procedure: STENT REMOVAL;  Surgeon: Wilhelmenia Aloha Raddle., MD;  Location: [facility]  ENDOSCOPY;  Service: Gastroenterology;;  biliary x2   STENT REMOVAL  07/24/2021   Procedure: AXIOS STENT REMOVAL;  Surgeon: Wilhelmenia Aloha Raddle., MD;  Location: [facility] ENDOSCOPY;  Service: Gastroenterology;;   CLEDA REMOVAL  07/30/2022   Procedure: STENT REMOVAL;  Surgeon: Wilhelmenia Aloha Raddle., MD;  Location: [facility];  Service: Gastroenterology;;   UPPER ESOPHAGEAL ENDOSCOPIC ULTRASOUND (EUS) N/A 11/07/2020   Procedure: UPPER ESOPHAGEAL ENDOSCOPIC ULTRASOUND (EUS);  Surgeon: Wilhelmenia Aloha Raddle., MD;  Location: [facility] ENDOSCOPY;  Service: Gastroenterology;  Laterality: N/A;   UPPER GASTROINTESTINAL ENDOSCOPY     WOUND DEBRIDEMENT  11/09/2020   Procedure: CYST NECROSECTOMY;  Surgeon: Wilhelmenia Aloha Raddle., MD;  Location: [facility] ENDOSCOPY;  Service: Gastroenterology;;    Family History family history includes Diabetes in his father; Heart disease in his father and maternal grandmother; Stroke in his maternal grandfather.  Social History Social History   Socioeconomic History   Marital status: Single    Spouse name: Not on file   Number of children: 0   Years of education: Not on file   Highest education level: GED or equivalent  Occupational History   Not on file  Tobacco Use   Smoking status: Every Day    Current packs/day: 0.25    Average packs/day: 0.3 packs/day for 26.0  years (6.5 ttl pk-yrs)    Types: Cigarettes   Smokeless tobacco: Never   Tobacco comments:    4-5 cig daily as of 10/07/2020.  Vaping Use   Vaping status: Never Used  Substance and Sexual Activity   Alcohol use: No   Drug use: No   Sexual activity: Never    Birth control/protection: None  Other Topics Concern   Not on file  Social History Narrative   Not on file   Social Drivers of Health   Financial Resource Strain: Low Risk  (07/27/2023)   Overall Financial Resource Strain (CARDIA)    Difficulty of Paying Living Expenses: Not very hard  Food Insecurity: No Food Insecurity (08/16/2023)   Hunger Vital Sign    Worried About Running Out of Food in the Last Year: Never true    Ran Out of Food in the Last Year: Never true  Recent Concern: Food Insecurity - Food Insecurity Present (07/27/2023)   Hunger Vital Sign    Worried About Running Out of Food in the Last Year: Never true    Ran Out of Food in the Last Year: Sometimes true  Transportation Needs: No Transportation Needs (08/16/2023)   PRAPARE - Administrator, Civil Service (Medical): No    Lack of Transportation (Non-Medical): No  Physical Activity: Insufficiently Active (07/27/2023)   Exercise Vital Sign    Days of Exercise per Week: 2 days    Minutes of Exercise per Session: 10 min  Stress: No Stress Concern Present (07/27/2023)   [employer] of Occupational Health - Occupational Stress Questionnaire    Feeling of Stress : Only a little  Social Connections: Unknown (07/27/2023)   Social Connection and Isolation Panel    Frequency of Communication with Friends and Family: Three times a week    Frequency of Social Gatherings with Friends and Family: Once a week    Attends Religious Services: Patient declined    Active Member of Clubs or Organizations: No    Attends Banker Meetings: Not on file    Marital Status: Divorced  Intimate Partner Violence: Not At Risk (08/16/2023)   Humiliation, Afraid,  Rape, and Kick questionnaire  Fear of Current or Ex-Partner: No    Emotionally Abused: No    Physically Abused: No    Sexually Abused: No    Lab Results  Component Value Date   HGBA1C 11.3 (A) 02/01/2024   HGBA1C 10.5 (A) 11/16/2023   HGBA1C 13.7 (H) 09/14/2023   Lab Results  Component Value Date   CHOL 158 09/14/2023   Lab Results  Component Value Date   HDL 44 09/14/2023   Lab Results  Component Value Date   LDLCALC 98 09/14/2023   Lab Results  Component Value Date   TRIG 72 09/14/2023   Lab Results  Component Value Date   CHOLHDL 3.6 09/14/2023   Lab Results  Component Value Date   CREATININE 0.86 12/02/2023   Lab Results  Component Value Date   GFR 99.32 12/02/2023   Lab Results  Component Value Date   MICROALBUR 0.5 09/14/2023      Component Value Date/Time   NA 139 12/02/2023 0811   NA 135 08/18/2023 1205   K 4.4 12/02/2023 0811   CL 102 12/02/2023 0811   CO2 29 12/02/2023 0811   GLUCOSE 188 (H) 12/02/2023 0811   BUN 15 12/02/2023 0811   BUN 15 08/18/2023 1205   CREATININE 0.86 12/02/2023 0811   CREATININE 0.79 09/14/2023 0940   CALCIUM  9.2 12/02/2023 0811   PROT 6.7 12/02/2023 0811   PROT 5.7 (L) 08/18/2023 1205   ALBUMIN  4.2 12/02/2023 0811   ALBUMIN  3.7 (L) 08/18/2023 1205   AST 19 12/02/2023 0811   ALT 29 12/02/2023 0811   ALKPHOS 130 (H) 12/02/2023 0811   BILITOT 0.4 12/02/2023 0811   BILITOT <0.2 08/18/2023 1205   GFRNONAA >60 08/13/2023 0450   GFRNONAA 89 04/22/2015 0828   GFRAA 85 10/04/2019 1433   GFRAA >89 04/22/2015 0828      Latest Ref Rng & Units 12/02/2023    8:11 AM 09/14/2023    9:40 AM 08/18/2023   12:05 PM  BMP  Glucose 70 - 99 mg/dL 811  697  790   BUN 6 - 23 mg/dL 15  16  15    Creatinine 0.40 - 1.50 mg/dL 9.13  9.20  9.24   BUN/Creat Ratio 6 - 22 (calc)  SEE NOTE:  20   Sodium 135 - 145 mEq/L 139  136  135   Potassium 3.5 - 5.1 mEq/L 4.4  4.8  4.7   Chloride 96 - 112 mEq/L 102  98  95   CO2 19 - 32 mEq/L 29   32  26   Calcium  8.4 - 10.5 mg/dL 9.2  9.7  9.4        Component Value Date/Time   WBC 14.1 (H) 12/02/2023 0811   RBC 5.12 12/02/2023 0811   HGB 15.9 12/02/2023 0811   HGB 13.8 08/18/2023 1205   HCT 46.8 12/02/2023 0811   HCT 41.0 08/18/2023 1205   PLT 429.0 (H) 12/02/2023 0811   PLT 484 (H) 08/18/2023 1205   MCV 91.4 12/02/2023 0811   MCV 94 08/18/2023 1205   MCH 31.7 08/18/2023 1205   MCH 31.7 08/12/2023 0257   MCHC 34.1 12/02/2023 0811   RDW 13.9 12/02/2023 0811   RDW 12.4 08/18/2023 1205   LYMPHSABS 3.7 (H) 08/18/2023 1205   MONOABS 0.7 07/07/2023 1310   EOSABS 0.1 08/18/2023 1205   BASOSABS 0.1 08/18/2023 1205     Parts of this note may have been dictated using voice recognition software. There may be variances in spelling and  vocabulary which are unintentional. Not all errors are proofread. Please notify the dino if any discrepancies are noted or if the meaning of any statement is not clear.

## 2024-02-01 NOTE — Telephone Encounter (Signed)
 Pharmacy Patient Advocate Encounter   Received notification from CoverMyMeds that prior authorization for Semglee  100 is required/requested.   Insurance verification completed.   The patient is insured through [employer] .   Per test claim: PA required; PA submitted to above mentioned insurance via CoverMyMeds Key/confirmation #/EOC [id number] Status is pending

## 2024-02-02 ENCOUNTER — Other Ambulatory Visit (HOSPITAL_COMMUNITY): Payer: Self-pay

## 2024-02-02 ENCOUNTER — Encounter: Payer: Self-pay | Admitting: "Endocrinology

## 2024-02-02 NOTE — Telephone Encounter (Signed)
 Pharmacy Patient Advocate Encounter  Received notification from [facility] that Prior Authorization for Semglee  100 has been APPROVED from 02/02/24 to 02/01/25. Ran test claim, Copay is $4.00. This test claim was processed through [facility]- copay amounts may vary at other pharmacies due to pharmacy/plan contracts, or as the patient moves through the different stages of their insurance plan.   PA #/Case ID/Reference #: [id number]

## 2024-02-02 NOTE — Telephone Encounter (Signed)
 Approved.

## 2024-02-04 ENCOUNTER — Telehealth: Payer: Self-pay | Admitting: Dietician

## 2024-02-04 ENCOUNTER — Other Ambulatory Visit: Payer: Self-pay

## 2024-02-04 DIAGNOSIS — E11649 Type 2 diabetes mellitus with hypoglycemia without coma: Secondary | ICD-10-CM

## 2024-02-04 MED ORDER — SEMGLEE (YFGN) 100 UNIT/ML ~~LOC~~ SOPN: 50.0000 [IU] | PEN_INJECTOR | Freq: Every day | SUBCUTANEOUS | Status: DC

## 2024-02-04 NOTE — Telephone Encounter (Signed)
 Patient called stating that he is on his last Semglee  pen and the pharmacy states it is on back order.  Spoke with CMA who stated that she sent this prescription to [facility] in [place].  Patient informed.  Leita Constable, RD, LDN, CDCES, DipACLM

## 2024-02-07 ENCOUNTER — Other Ambulatory Visit: Payer: Self-pay

## 2024-02-07 ENCOUNTER — Telehealth: Payer: Self-pay

## 2024-02-07 DIAGNOSIS — E11649 Type 2 diabetes mellitus with hypoglycemia without coma: Secondary | ICD-10-CM

## 2024-02-07 MED ORDER — SEMGLEE (YFGN) 100 UNIT/ML ~~LOC~~ SOPN: 50.0000 [IU] | PEN_INJECTOR | Freq: Every day | SUBCUTANEOUS | Status: DC

## 2024-02-07 NOTE — Telephone Encounter (Signed)
 Patient informed to call endocrinology since they prescribed the medication.

## 2024-02-07 NOTE — Telephone Encounter (Signed)
 Copied from CRM [id number]. Topic: Clinical - Prescription Issue >> Feb 07, 2024  8:28 AM Merlynn A wrote: Reason for CRM: Patient called in regarding medication Insulin  Pen Needle (BD PEN NEEDLE NANO 2ND GEN) 32G X 4 MM MIS. Patient is down to 1 pen. Patient stated that [facility] has no insulin  in stock and that he needs his prescription sent to different pharmacy [facility]. Please contact patient to advise. Patient can be reached at [phone].

## 2024-02-07 NOTE — Telephone Encounter (Signed)
 He can have his insulin  sent to ANY pharmacy of his choosing. Just needs to tell [facility] where to transfer it. Does he want me to renew pen needles for his insulin  to [facility]? Also, if needing different insulin , he should reach out to his endocrinologist who manages his diabetes.

## 2024-02-10 ENCOUNTER — Telehealth: Payer: Self-pay

## 2024-02-10 ENCOUNTER — Other Ambulatory Visit: Payer: Self-pay

## 2024-02-10 MED ORDER — INSULIN GLARGINE 100 UNIT/ML SOLOSTAR PEN: 50.0000 [IU] | PEN_INJECTOR | Freq: Every day | SUBCUTANEOUS | Status: DC

## 2024-02-10 NOTE — Telephone Encounter (Signed)
 Patient picked up sample

## 2024-02-10 NOTE — Telephone Encounter (Signed)
 Pt called in for refill of semglee . Pt reports that semglee  is on back order at [facility] and [facility]. Pt will be out of insulin  today.

## 2024-02-29 ENCOUNTER — Encounter: Payer: Self-pay | Admitting: "Endocrinology

## 2024-02-29 ENCOUNTER — Other Ambulatory Visit: Payer: Self-pay

## 2024-02-29 ENCOUNTER — Ambulatory Visit (INDEPENDENT_AMBULATORY_CARE_PROVIDER_SITE_OTHER): Payer: MEDICAID | Admitting: "Endocrinology

## 2024-02-29 VITALS — BP 122/80 | HR 80 | Ht 70.0 in | Wt 187.0 lb

## 2024-02-29 DIAGNOSIS — E11649 Type 2 diabetes mellitus with hypoglycemia without coma: Secondary | ICD-10-CM | POA: Diagnosis not present

## 2024-02-29 DIAGNOSIS — E1165 Type 2 diabetes mellitus with hyperglycemia: Secondary | ICD-10-CM

## 2024-02-29 DIAGNOSIS — Z794 Long term (current) use of insulin: Secondary | ICD-10-CM | POA: Diagnosis not present

## 2024-02-29 DIAGNOSIS — Z7984 Long term (current) use of oral hypoglycemic drugs: Secondary | ICD-10-CM

## 2024-02-29 DIAGNOSIS — E7800 Pure hypercholesterolemia, unspecified: Secondary | ICD-10-CM

## 2024-02-29 MED ORDER — SEMGLEE (YFGN) 100 UNIT/ML ~~LOC~~ SOPN
40.0000 [IU] | PEN_INJECTOR | Freq: Every day | SUBCUTANEOUS | 3 refills | Status: DC
  Filled 2024-02-29: qty 36, 90d supply, fill #0

## 2024-02-29 NOTE — Progress Notes (Signed)
 Outpatient Endocrinology Note Steve Birmingham, MD  02/29/24   Steve Romero 01/11/1952 [id number]  Referring Provider: Jolinda Norene HERO, DO Primary Care Provider: Jolinda Norene HERO, DO Reason for consultation: Subjective   Assessment & Plan  Diagnoses and all orders for this visit:  Uncontrolled type 2 diabetes mellitus with hyperglycemia (HCC) -     SEMGLEE , YFGN, 100 UNIT/ML Pen; Inject 40 Units into the skin daily.  Uncontrolled type 2 diabetes mellitus with hypoglycemia without coma (HCC)  Long term (current) use of oral hypoglycemic drugs  Long-term insulin  use (HCC)  Pure hypercholesterolemia   Diabetes Type II complicated by neuropathy,  Lab Results  Component Value Date   GFR 99.32 12/02/2023   Hba1c goal less than 7, current Hba1c is  Lab Results  Component Value Date   HGBA1C 11.3 (A) 02/01/2024   Will recommend the following: Synjardy  XR 11/998 mg qd  Semglee  40 units qam to target fasting blood sugar at 100-150 Novolog  12-15 units tid 15 min before meals Correction scale: Use in addition to your meal time/short acting insulin  (both together) based on blood sugars as follows:  151 - 180: 1 unit 181 - 210: 2 units 211 - 240: 3 units 241 - 270: 4 units 271 - 300: 5 units 301 - 330: 6 units 331 - 360: 7 units 361 - 390: 8 units 391 - 420/High : 9 units   11/09/23: Ordered diabetes education that patient got today, pt is drinking sweet drinks all day leading to high sugars, was not taking 20 units but only correction scale, had a low in 10/2023, discussed hypoglycemia management again, with plans to try CeQur for being compliant with short acting insulin  as well as sticking to 1 drink a day instead of drinking all day the sugary beverages 11/16/23 patient again got diabetes education today.  Patient repeatedly says that he has been doing everything right, taking up to 6 shots of insulin  a: day, 2 of the Semglee  and up to 4 of the NovoLog  15 units  with meals.  His blood sugars continue to stay high except rarely knows that patient keeps telling because of his pancreas.  Reinforced compliance and discussed the dangers of continuing dose elevation of Novolog  in case of noncompliance, patient understands and agreed 12/15/23: seen DM educator, using insulin  in scar tissue, drinking juices and mountain dew  01/03/24: ordered podiatry referral, r/o onychomycosis; discussed again about decreasing smoking  No known contraindications/side effects to any of above medications Glucagon  discussed and prescribed with refills on 09/2023  -Last LD and Tg are as follows: Lab Results  Component Value Date   LDLCALC 98 09/14/2023    Lab Results  Component Value Date   TRIG 72 09/14/2023   -stopped rosuvastatin  10 mg every day, reinforced it previously, now taking it  -Follow low fat diet and exercise   -Blood pressure goal <140/90 - Microalbumin/creatinine goal is < 30 -Last MA/Cr is as follows: Lab Results  Component Value Date   MICROALBUR 0.5 09/14/2023   -not on ACE/ARB  -diet changes including salt restriction -limit eating outside -counseled BP targets per standards of diabetes care -uncontrolled blood pressure can lead to retinopathy, nephropathy and cardiovascular and atherosclerotic heart disease  Reviewed and counseled on: -A1C target -Blood sugar targets -Complications of uncontrolled diabetes  -Checking blood sugar before meals and bedtime and bring log next visit -All medications with mechanism of action and side effects -Hypoglycemia management: rule of 15's, Glucagon  Emergency Kit and  medical alert ID -low-carb low-fat plate-method diet -At least 20 minutes of physical activity per day -Annual dilated retinal eye exam and foot exam -compliance and follow up needs -follow up as scheduled or earlier if problem gets worse  Call if blood sugar is less than 70 or consistently above 250    Take a 15 gm snack of carbohydrate  at bedtime before you go to sleep if your blood sugar is less than 100.    If you are going to fast after midnight for a test or procedure, ask your physician for instructions on how to reduce/decrease your insulin  dose.    Call if blood sugar is less than 70 or consistently above 250  -Treating a low sugar by rule of 15  (15 gms of sugar every 15 min until sugar is more than 70) If you feel your sugar is low, test your sugar to be sure If your sugar is low (less than 70), then take 15 grams of a fast acting Carbohydrate (3-4 glucose tablets or glucose gel or 4 ounces of juice or regular soda) Recheck your sugar 15 min after treating low to make sure it is more than 70 If sugar is still less than 70, treat again with 15 grams of carbohydrate          Don't drive the hour of hypoglycemia  If unconscious/unable to eat or drink by mouth, use glucagon  injection or nasal spray baqsimi  and call 911. Can repeat again in 15 min if still unconscious.  Return in about 2 months (around 05/01/2024).   I have reviewed current medications, nurse's notes, allergies, vital signs, past medical and surgical history, family medical history, and social history for this encounter. Counseled patient on symptoms, examination findings, lab findings, imaging results, treatment decisions and monitoring and prognosis. The patient understood the recommendations and agrees with the treatment plan. All questions regarding treatment plan were fully answered.  Steve Birmingham, MD  02/29/24   History of Present Illness Steve Romero is a 53 y.o. year old male who presents for follow up of Type II diabetes mellitus.  Home diabetes regimen: Synjardy  XR 11/998 mg every day-cannot tolerate bid  Semglee  40 units qd Novolog  10 units 15-20 min tidac + sliding scale   COMPLICATIONS -  MI/Stroke -  retinopathy +  neuropathy -  nephropathy  BLOOD SUGAR DATA CGM interpretation: At today's visit, we reviewed her CGM  downloads. The full report is scanned in the media. Reviewing the CGM trends, BG are elevated in the daytime *<2 day worth of BG log  Physical Exam  BP 122/80   Pulse 80   Ht 5' 10 (1.778 m)   Wt 187 lb (84.8 kg)   SpO2 96%   BMI 26.83 kg/m    Constitutional: well developed, well nourished Head: normocephalic, atraumatic Eyes: sclera anicteric, no redness Neck: supple Lungs: normal respiratory effort Neurology: alert and oriented Skin: dry, no appreciable rashes Musculoskeletal: no appreciable defects Psychiatric: normal mood and affect Diabetic Foot Exam - Simple   No data filed      Current Medications Patient's Medications  New Prescriptions   No medications on file  Previous Medications   ACCU-CHEK SOFTCLIX LANCETS LANCETS    Check BS in the morning and at night Dx E11.9   ACETAMINOPHEN  (TYLENOL ) 500 MG TABLET    Take 2 tablets (1,000 mg total) by mouth every 8 (eight) hours as needed for mild pain (pain score 1-3).   ALBUTEROL  (VENTOLIN  HFA)  108 (90 BASE) MCG/ACT INHALER    Inhale 2 puffs into the lungs every 6 (six) hours as needed for wheezing or shortness of breath.   ASPIRIN  EC 81 MG TABLET    Take 81-162 mg by mouth daily as needed (for pain). Swallow whole.   CONTINUOUS BLOOD GLUC RECEIVER (FREESTYLE LIBRE READER) DEVI    1 Units by Does not apply route daily. UAD to test BGs daily. Dx E11.9   CONTINUOUS GLUCOSE RECEIVER (FREESTYLE LIBRE 3 READER) DEVI    1 Device by Does not apply route continuous.   CONTINUOUS GLUCOSE SENSOR (FREESTYLE LIBRE 3 PLUS SENSOR) MISC    Check BGs continuously. E11.9 Change sensor every 15 days.   EMPAGLIFLOZIN-METFORMIN HCL ER (SYNJARDY  XR) 11-998 MG TB24    Take 1 tablet by mouth 2 (two) times daily.   GLUCAGON  (BAQSIMI  ONE PACK) 3 MG/DOSE POWD    Place 1 Device into the nose as needed (Low blood sugar with impaired consciousness).   GLUCOSE BLOOD (ACCU-CHEK GUIDE TEST) TEST STRIP    Check BS in the morning and at night Dx E11.9    INSULIN  ASPART (NOVOLOG  FLEXPEN) 100 UNIT/ML FLEXPEN    10 units tidac + sliding scale, max dose 60 units a day   INSULIN  GLARGINE (LANTUS ) 100 UNIT/ML SOLOSTAR PEN    Inject 50-60 Units into the skin daily.   INSULIN  PEN NEEDLE (BD PEN NEEDLE NANO 2ND GEN) 32G X 4 MM MISC    UAD to administer insulin  Dx E11.10   LIPASE/PROTEASE/AMYLASE (CREON ) 36000 UNITS CPEP CAPSULE    Take 3 capsules with meals and 1-2  capsule with snacks ( up too 2 snacks )   NALOXONE  (NARCAN ) NASAL SPRAY 4 MG/0.1 ML    Place 1 spray into the nose as directed.   ONDANSETRON  (ZOFRAN ) 4 MG TABLET    Take 4 mg by mouth every 6 (six) hours as needed for nausea or vomiting.   OXYCODONE  HCL 10 MG TABS    Take 1 tablet (10 mg total) by mouth every 6 (six) hours as needed (for pain). To last 3 months   ROSUVASTATIN  (CRESTOR ) 10 MG TABLET    Take 1 tablet (10 mg total) by mouth daily.  Modified Medications   Modified Medication Previous Medication   SEMGLEE , YFGN, 100 UNIT/ML PEN SEMGLEE , YFGN, 100 UNIT/ML Pen      Inject 40 Units into the skin daily.    Inject 50-60 Units into the skin daily.  Discontinued Medications   No medications on file    Allergies Allergies  Allergen Reactions   Desipramine Nausea Only and Rash    Past Medical History Past Medical History:  Diagnosis Date   Acute pancreatitis without necrosis or infection, unspecified    AKI (acute kidney injury) (HCC) 05/17/2022   Anxiety 10/2014   Ascites    Bipolar disorder (HCC) age 24   COPD (chronic obstructive pulmonary disease) (HCC) 2016   Depression age 31   DKA (diabetic ketoacidosis) (HCC) 08/11/2023   Enlarged prostate    Hyperlipidemia 2016   Hypertension 2013   Nausea vomiting and diarrhea 06/15/2022   Schizoaffective disorder (HCC) 11/13/2014   Sleep apnea    Does not wear c-pap, sleeps in sitting up position per pt   Thyroid  disease 11/2014    Past Surgical History Past Surgical History:  Procedure Laterality Date   BALLOON  DILATION N/A 11/07/2020   Procedure: BALLOON DILATION;  Surgeon: Wilhelmenia Aloha Raddle., MD;  [facility]COPY;  Service:  Gastroenterology;  Laterality: N/A;   BALLOON DILATION N/A 06/26/2021   Procedure: BALLOON DILATION;  Surgeon: Wilhelmenia Aloha Raddle., MD;  Location: [facility] ENDOSCOPY;  Service: Gastroenterology;  Laterality: N/A;   BILIARY BRUSHING  09/23/2023   Procedure: BRUSH BIOPSY, BILE DUCT;  Surgeon: Wilhelmenia Aloha Raddle., MD;  Location: [facility]COPY;  Service: Gastroenterology;;   BILIARY DILATION  07/24/2021   Procedure: BILIARY DILATION;  Surgeon: Wilhelmenia Aloha Raddle., MD;  Location: [facility] ENDOSCOPY;  Service: Gastroenterology;;   BILIARY DILATION  05/22/2022   Procedure: BILIARY DILATION;  Surgeon: Wilhelmenia Aloha Raddle., MD;  Location: [facility] ENDOSCOPY;  Service: Gastroenterology;;   BILIARY DILATION  06/16/2022   Procedure: GASTROSTOMY DILATION;  Surgeon: Wilhelmenia Aloha Raddle., MD;  Location: [facility]OSCOPY;  Service: Gastroenterology;;   BILIARY STENT PLACEMENT N/A 10/10/2020   Procedure: BILIARY STENT PLACEMENT;  Surgeon: Golda Claudis PENNER, MD;  Location: [facility];  Service: Endoscopy;  Laterality: N/A;   BILIARY STENT PLACEMENT  11/09/2020   Procedure: BILIARY STENT PLACEMENT;  Surgeon: Wilhelmenia Aloha Raddle., MD;  Location: [facility]COPY;  Service: Gastroenterology;;   BILIARY STENT PLACEMENT  11/07/2020   Procedure: BILIARY STENT PLACEMENT;  Surgeon: Wilhelmenia Aloha Raddle., MD;  Location: [facility] ENDOSCOPY;  Service: Gastroenterology;;   BILIARY STENT PLACEMENT  11/11/2020   Procedure: BILIARY STENT PLACEMENT;  Surgeon: Wilhelmenia Aloha Raddle., MD;  Location: [facility] ENDOSCOPY;  Service: Gastroenterology;;   BILIARY STENT PLACEMENT  11/14/2020   Procedure: BILIARY STENT PLACEMENT;  Surgeon: Wilhelmenia Aloha Raddle., MD;  Location: [facility] ENDOSCOPY;  Service: Gastroenterology;;   BILIARY STENT PLACEMENT N/A 01/29/2021   Procedure: BILIARY STENT PLACEMENT;  Surgeon: Wilhelmenia Aloha Raddle., MD;  Location: [facility];  Service: Gastroenterology;  Laterality: N/A;   BILIARY STENT PLACEMENT  06/26/2021   Procedure: BILIARY STENT PLACEMENT;  Surgeon: Wilhelmenia Aloha Raddle., MD;  Location: [facility] ENDOSCOPY;  Service: Gastroenterology;;   BILIARY STENT PLACEMENT  07/24/2021   Procedure: BILIARY STENT PLACEMENT;  Surgeon: Wilhelmenia Aloha Raddle., MD;  Location: [facility] ENDOSCOPY;  Service: Gastroenterology;;   BILIARY STENT PLACEMENT N/A 09/23/2023   Procedure: INSERTION, STENT, BILE DUCT;  Surgeon: Wilhelmenia Aloha Raddle., MD;  Location: [facility]OS[facility];  Service: Gastroenterology;  Laterality: N/A;   BIOPSY  11/07/2020   Procedure: BIOPSY;  Surgeon: Wilhelmenia Aloha Raddle., MD;  Location: [facility] ENDOSCOPY;  Service: Gastroenterology;;   BIOPSY  12/20/2020   Procedure: BIOPSY;  Surgeon: Wilhelmenia Aloha Raddle., MD;  Location: [facility];  Service: Gastroenterology;;   BIOPSY  06/26/2021   Procedure: BIOPSY;  Surgeon: Wilhelmenia Aloha Raddle., MD;  Location: [facility] ENDOSCOPY;  Service: Gastroenterology;;   CARPAL TUNNEL RELEASE Left 02/21/2016   Procedure: CARPAL TUNNEL RELEASE;  Surgeon: Taft FORBES Minerva, MD;  Location: [facility];  Service: Orthopedics;  Laterality: Left;   CHOLECYSTECTOMY N/A 10/09/2020   Procedure: LAPAROSCOPIC CHOLECYSTECTOMY;  Surgeon: Kallie Manuelita BROCKS, MD;  Location: [facility];  Service: General;  Laterality: N/A;   CYST ENTEROSTOMY N/A 11/07/2020   Procedure: CYST ENTEROSTOMY;  Surgeon: Wilhelmenia Aloha Raddle., MD;  Location: [facility] ENDOSCOPY;  Service: Gastroenterology;  Laterality: N/A;   CYST GASTROSTOMY  11/11/2020   Procedure: CYST NECROSECTOMY;  Surgeon: Wilhelmenia Aloha Raddle., MD;  Location: [facility] ENDOSCOPY;  Service: Gastroenterology;;   CYST GASTROSTOMY  11/14/2020   Procedure: CYST NECROSECTOMY;  Surgeon: Wilhelmenia Aloha Raddle., MD;  Location: [facility] ENDOSCOPY;  Service: Gastroenterology;;   CYST GASTROSTOMY  06/16/2022   Procedure: CYST GASTROSTOMY;  Surgeon: Wilhelmenia Aloha Raddle., MD;  Location: [facility] ENDOSCOPY;  Service: Gastroenterology;;   CYST REMOVAL HAND  CYSTOSCOPY  06/26/2021   Procedure: CYSTOGASTROSTOMY;  Surgeon: Mansouraty, Aloha Raddle., MD;  Location: [facility] ENDOSCOPY;  Service: Gastroenterology;;   ENDOSCOPIC RETROGRADE CHOLANGIOPANCREATOGRAPHY (ERCP) WITH PROPOFOL  N/A 11/09/2020   Procedure: ENDOSCOPIC RETROGRADE CHOLANGIOPANCREATOGRAPHY (ERCP) WITH PROPOFOL ;  Surgeon: Wilhelmenia Aloha Raddle., MD;  Location: [facility] ENDOSCOPY;  Service: Gastroenterology;  Laterality: N/A;   ENDOSCOPIC RETROGRADE CHOLANGIOPANCREATOGRAPHY (ERCP) WITH PROPOFOL  N/A 12/20/2020   Procedure: ENDOSCOPIC RETROGRADE CHOLANGIOPANCREATOGRAPHY (ERCP) WITH PROPOFOL ;  Surgeon: Wilhelmenia Aloha Raddle., MD;  Location: [facility] ENDOSCOPY;  Service: Gastroenterology;  Laterality: N/A;   ENDOSCOPIC RETROGRADE CHOLANGIOPANCREATOGRAPHY (ERCP) WITH PROPOFOL  N/A 01/29/2021   Procedure: ENDOSCOPIC RETROGRADE CHOLANGIOPANCREATOGRAPHY (ERCP) WITH PROPOFOL ;  Surgeon: Wilhelmenia Aloha Raddle., MD;  Location: [facility] ENDOSCOPY;  Service: Gastroenterology;  Laterality: N/A;   ENDOSCOPIC RETROGRADE CHOLANGIOPANCREATOGRAPHY (ERCP) WITH PROPOFOL  N/A 07/24/2021   Procedure: ENDOSCOPIC RETROGRADE CHOLANGIOPANCREATOGRAPHY (ERCP) WITH PROPOFOL ;  Surgeon: Wilhelmenia Aloha Raddle., MD;  Location: [facility] ENDOSCOPY;  Service: Gastroenterology;  Laterality: N/A;   ENDOSCOPIC RETROGRADE CHOLANGIOPANCREATOGRAPHY (ERCP) WITH PROPOFOL  N/A 09/23/2023   Procedure: ENDOSCOPIC RETROGRADE CHOLANGIOPANCREATOGRAPHY (ERCP) WITH PROPOFOL ;  Surgeon: Wilhelmenia Aloha Raddle., MD;  Location: [facility] ENDOSCOPY;  Service: Gastroenterology;  Laterality: N/A;   ERCP N/A 10/10/2020   Procedure: ENDOSCOPIC RETROGRADE CHOLANGIOPANCREATOGRAPHY (ERCP);  Surgeon: Golda Claudis PENNER, MD;  Location: [facility];  Service: Endoscopy;  Laterality: N/A;   ERCP     ERCP N/A 05/22/2022   Procedure: ENDOSCOPIC RETROGRADE CHOLANGIOPANCREATOGRAPHY (ERCP);  Surgeon: Wilhelmenia Aloha Raddle., MD;  Location: [facility]OSCOPY;  Service: Gastroenterology;  Laterality: N/A;   ESOPHAGOGASTRODUODENOSCOPY N/A 11/11/2020   Procedure: ESOPHAGOGASTRODUODENOSCOPY (EGD);  Surgeon: Wilhelmenia Aloha Raddle., MD;  Location: [facility] ENDOSCOPY;  Service: Gastroenterology;  Laterality: N/A;   ESOPHAGOGASTRODUODENOSCOPY N/A 11/14/2020   Procedure: ESOPHAGOGASTRODUODENOSCOPY (EGD);  Surgeon: Wilhelmenia Aloha Raddle., MD;  Location: [facility] ENDOSCOPY;  Service: Gastroenterology;  Laterality: N/A;   ESOPHAGOGASTRODUODENOSCOPY (EGD) WITH PROPOFOL  N/A 11/09/2020   Procedure: ESOPHAGOGASTRODUODENOSCOPY (EGD) WITH PROPOFOL ;  Surgeon: Wilhelmenia Aloha Raddle., MD;  Location: [facility] ENDOSCOPY;  Service: Gastroenterology;  Laterality: N/A;   ESOPHAGOGASTRODUODENOSCOPY (EGD) WITH PROPOFOL  N/A 11/07/2020   Procedure: ESOPHAGOGASTRODUODENOSCOPY (EGD) WITH PROPOFOL ;  Surgeon: Wilhelmenia Aloha Raddle., MD;  Location: [facility] ENDOSCOPY;  Service: Gastroenterology;  Laterality: N/A;   ESOPHAGOGASTRODUODENOSCOPY (EGD) WITH PROPOFOL  N/A 12/20/2020   Procedure: ESOPHAGOGASTRODUODENOSCOPY (EGD) WITH PROPOFOL ;  Surgeon: Wilhelmenia Aloha Raddle., MD;  Location: [facility] ENDOSCOPY;  Service: Gastroenterology;  Laterality: N/A;   ESOPHAGOGASTRODUODENOSCOPY (EGD) WITH PROPOFOL  N/A 06/26/2021   Procedure: ESOPHAGOGASTRODUODENOSCOPY (EGD) WITH PROPOFOL ;  Surgeon: Wilhelmenia Aloha Raddle., MD;  Location: [facility] ENDOSCOPY;  Service: Gastroenterology;  Laterality: N/A;   ESOPHAGOGASTRODUODENOSCOPY (EGD) WITH PROPOFOL  N/A 07/24/2021   Procedure: ESOPHAGOGASTRODUODENOSCOPY (EGD) WITH PROPOFOL ;  Surgeon: Wilhelmenia Aloha Raddle., MD;  Location: [facility] ENDOSCOPY;  Service: Gastroenterology;  Laterality: N/A;  AXIOS STENT   ESOPHAGOGASTRODUODENOSCOPY (EGD) WITH PROPOFOL  N/A 05/22/2022   Procedure: ESOPHAGOGASTRODUODENOSCOPY (EGD) WITH PROPOFOL ;  Surgeon: Wilhelmenia Aloha Raddle., MD;  Location: [facility] ENDOSCOPY;  Service: Gastroenterology;  Laterality: N/A;    ESOPHAGOGASTRODUODENOSCOPY (EGD) WITH PROPOFOL  N/A 06/16/2022   Procedure: ESOPHAGOGASTRODUODENOSCOPY (EGD) WITH PROPOFOL ;  Surgeon: Wilhelmenia Aloha Raddle., MD;  Location: [facility] ENDOSCOPY;  Service: Gastroenterology;  Laterality: N/A;   ESOPHAGOGASTRODUODENOSCOPY (EGD) WITH PROPOFOL  N/A 07/30/2022   Procedure: ESOPHAGOGASTRODUODENOSCOPY (EGD) WITH PROPOFOL ;  Surgeon: Wilhelmenia Aloha Raddle., MD;  Location: [facility] ENDOSCOPY;  Service: Gastroenterology;  Laterality: N/A;   ESOPHAGOGASTRODUODENOSCOPY (EGD) WITH PROPOFOL  N/A 03/30/2023   Procedure: ESOPHAGOGASTRODUODENOSCOPY (EGD) WITH PROPOFOL ;  Surgeon: Avram Lupita BRAVO, MD;  Location: [facility] ENDOSCOPY;  Service: Gastroenterology;  Laterality: N/A;   EUS  11/14/2020   Procedure:  UPPER ENDOSCOPIC ULTRASOUND (EUS) LINEAR;  Surgeon: Wilhelmenia Aloha Raddle., MD;  Location: [facility] ENDOSCOPY;  Service: Gastroenterology;;   EUS N/A 06/26/2021   Procedure: UPPER ENDOSCOPIC ULTRASOUND (EUS) RADIAL;  Surgeon: Wilhelmenia Aloha Raddle., MD;  Location: [facility] ENDOSCOPY;  Service: Gastroenterology;  Laterality: N/A;   EUS N/A 05/22/2022   Procedure: UPPER ENDOSCOPIC ULTRASOUND (EUS) LINEAR;  Surgeon: Wilhelmenia Aloha Raddle., MD;  Location: [facility] ENDOSCOPY;  Service: Gastroenterology;  Laterality: N/A;   EUS N/A 06/16/2022   Procedure: UPPER ENDOSCOPIC ULTRASOUND (EUS) LINEAR;  Surgeon: Wilhelmenia Aloha Raddle., MD;  Location: [facility] ENDOSCOPY;  Service: Gastroenterology;  Laterality: N/A;   FINE NEEDLE ASPIRATION  05/22/2022   Procedure: FINE NEEDLE ASPIRATION;  Surgeon: Wilhelmenia Aloha Raddle., MD;  Location: [facility] ENDOSCOPY;  Service: Gastroenterology;;   GASTROINTESTINAL STENT REMOVAL  12/20/2020   Procedure: GASTROINTESTINAL STENT REMOVAL;  Surgeon: Wilhelmenia Aloha Raddle., MD;  Location: [facility] ENDOSCOPY;  Service: Gastroenterology;;  cyst gastrostomy stent and double pig tail stents x2 removed   HERNIA REPAIR Left 2002   groin   INGUINAL HERNIA REPAIR Left 04/05/2017   Procedure: RECURRENT  HERNIA REPAIR INGUINAL ADULT WITH MESH;  Surgeon: Mavis Anes, MD;  Location: [facility];  Service: General;  Laterality: Left;   MASS EXCISION Right 04/29/2020   Procedure: EXCISION MASS RIGHT WRIST;  Surgeon: Murrell Drivers, MD;  Location: [facility];  Service: Orthopedics;  Laterality: Right;   MASS EXCISION Right 10/09/2020   Procedure: EXCISION MASS, ABDOMINAL WALL, 2CM;  Surgeon: Kallie Manuelita BROCKS, MD;  Location: [facility];  Service: General;  Laterality: Right;   PANCREATIC STENT PLACEMENT  06/26/2021   Procedure: AXIOS STENT PLACEMENT;  Surgeon: Wilhelmenia Aloha Raddle., MD;  Location: [facility] ENDOSCOPY;  Service: Gastroenterology;;   PANCREATIC STENT PLACEMENT  06/16/2022   Procedure: PANCREATIC STENT PLACEMENT;  Surgeon: Wilhelmenia Aloha Raddle., MD;  Location: [facility];  Service: Gastroenterology;;   REMOVAL OF STONES  12/20/2020   Procedure: REMOVAL OF STONES;  Surgeon: Wilhelmenia Aloha Raddle., MD;  Location: [facility];  Service: Gastroenterology;;   REMOVAL OF STONES  07/24/2021   Procedure: REMOVAL OF STONES;  Surgeon: Wilhelmenia Aloha Raddle., MD;  Location: [facility] ENDOSCOPY;  Service: Gastroenterology;;   REMOVAL OF STONES  05/22/2022   Procedure: REMOVAL OF STONES;  Surgeon: Wilhelmenia Aloha Raddle., MD;  Location: [facility];  Service: Gastroenterology;;   ANNETT N/A 10/10/2020   Procedure: ANNETT;  Surgeon: Golda Claudis PENNER, MD;  Location: [facility];  Service: Endoscopy;  Laterality: N/A;   SPHINCTEROTOMY  07/24/2021   Procedure: SPHINCTEROTOMY;  Surgeon: Mansouraty, Aloha Raddle., MD;  Location: [facility] ENDOSCOPY;  Service: Gastroenterology;;   SPLENECTOMY, TOTAL N/A 11/17/2022   Procedure: SPLENECTOMY;  Surgeon: Dasie Leonor CROME, MD;  Location: [facility];  Service: General;  Laterality: N/A;   SPYGLASS CHOLANGIOSCOPY N/A 12/20/2020   Procedure: DEBHOJDD CHOLANGIOSCOPY;  Surgeon: Wilhelmenia Aloha Raddle., MD;  Location: [facility] ENDOSCOPY;  Service: Gastroenterology;   Laterality: N/A;   SPYGLASS CHOLANGIOSCOPY N/A 07/24/2021   Procedure: SPYGLASS CHOLANGIOSCOPY;  Surgeon: Wilhelmenia Aloha Raddle., MD;  Location: [facility] ENDOSCOPY;  Service: Gastroenterology;  Laterality: N/A;   STENT REMOVAL  11/09/2020   Procedure: STENT REMOVAL;  Surgeon: Wilhelmenia Aloha Raddle., MD;  Location: [facility] ENDOSCOPY;  Service: Gastroenterology;;   CLEDA REMOVAL  11/11/2020   Procedure: STENT REMOVAL;  Surgeon: Wilhelmenia Aloha Raddle., MD;  Location: [facility] ENDOSCOPY;  Service: Gastroenterology;;   CLEDA REMOVAL  11/14/2020   Procedure: STENT REMOVAL;  Surgeon: Wilhelmenia Aloha Raddle., MD;  Location: [facility] ENDOSCOPY;  Service: Gastroenterology;;  STENT REMOVAL  12/20/2020   Procedure: STENT REMOVAL;  Surgeon: Wilhelmenia Aloha Raddle., MD;  Location: [facility];  Service: Gastroenterology;;  biliary x2   STENT REMOVAL  07/24/2021   Procedure: JUSTINA CARLS REMOVAL;  Surgeon: Wilhelmenia Aloha Raddle., MD;  Location: [facility] ENDOSCOPY;  Service: Gastroenterology;;   CARLS REMOVAL  07/30/2022   Procedure: STENT REMOVAL;  Surgeon: Wilhelmenia Aloha Raddle., MD;  Location: [facility];  Service: Gastroenterology;;   UPPER ESOPHAGEAL ENDOSCOPIC ULTRASOUND (EUS) N/A 11/07/2020   Procedure: UPPER ESOPHAGEAL ENDOSCOPIC ULTRASOUND (EUS);  Surgeon: Wilhelmenia Aloha Raddle., MD;  Location: [facility] ENDOSCOPY;  Service: Gastroenterology;  Laterality: N/A;   UPPER GASTROINTESTINAL ENDOSCOPY     WOUND DEBRIDEMENT  11/09/2020   Procedure: CYST NECROSECTOMY;  Surgeon: Wilhelmenia Aloha Raddle., MD;  Location: [facility] ENDOSCOPY;  Service: Gastroenterology;;    Family History family history includes Diabetes in his father; Heart disease in his father and maternal grandmother; Stroke in his maternal grandfather.  Social History Social History   Socioeconomic History   Marital status: Single    Spouse name: Not on file   Number of children: 0   Years of education: Not on file   Highest education level: GED or equivalent   Occupational History   Not on file  Tobacco Use   Smoking status: Every Day    Current packs/day: 0.25    Average packs/day: 0.3 packs/day for 26.0 years (6.5 ttl pk-yrs)    Types: Cigarettes   Smokeless tobacco: Never   Tobacco comments:    4-5 cig daily as of 10/07/2020.  Vaping Use   Vaping status: Never Used  Substance and Sexual Activity   Alcohol use: No   Drug use: No   Sexual activity: Never    Birth control/protection: None  Other Topics Concern   Not on file  Social History Narrative   Not on file   Social Drivers of Health   Financial Resource Strain: Low Risk  (07/27/2023)   Overall Financial Resource Strain (CARDIA)    Difficulty of Paying Living Expenses: Not very hard  Food Insecurity: No Food Insecurity (08/16/2023)   Hunger Vital Sign    Worried About Running Out of Food in the Last Year: Never true    Ran Out of Food in the Last Year: Never true  Recent Concern: Food Insecurity - Food Insecurity Present (07/27/2023)   Hunger Vital Sign    Worried About Running Out of Food in the Last Year: Never true    Ran Out of Food in the Last Year: Sometimes true  Transportation Needs: No Transportation Needs (08/16/2023)   PRAPARE - Administrator, Civil Service (Medical): No    Lack of Transportation (Non-Medical): No  Physical Activity: Insufficiently Active (07/27/2023)   Exercise Vital Sign    Days of Exercise per Week: 2 days    Minutes of Exercise per Session: 10 min  Stress: No Stress Concern Present (07/27/2023)   [employer] of Occupational Health - Occupational Stress Questionnaire    Feeling of Stress : Only a little  Social Connections: Unknown (07/27/2023)   Social Connection and Isolation Panel    Frequency of Communication with Friends and Family: Three times a week    Frequency of Social Gatherings with Friends and Family: Once a week    Attends Religious Services: Patient declined    Database administrator or Organizations: No     Attends Engineer, structural: Not on file    Marital Status: Divorced  Intimate Partner Violence: Not At Risk (08/16/2023)   Humiliation, Afraid, Rape, and Kick questionnaire    Fear of Current or Ex-Partner: No    Emotionally Abused: No    Physically Abused: No    Sexually Abused: No    Lab Results  Component Value Date   HGBA1C 11.3 (A) 02/01/2024   HGBA1C 10.5 (A) 11/16/2023   HGBA1C 13.7 (H) 09/14/2023   Lab Results  Component Value Date   CHOL 158 09/14/2023   Lab Results  Component Value Date   HDL 44 09/14/2023   Lab Results  Component Value Date   LDLCALC 98 09/14/2023   Lab Results  Component Value Date   TRIG 72 09/14/2023   Lab Results  Component Value Date   CHOLHDL 3.6 09/14/2023   Lab Results  Component Value Date   CREATININE 0.86 12/02/2023   Lab Results  Component Value Date   GFR 99.32 12/02/2023   Lab Results  Component Value Date   MICROALBUR 0.5 09/14/2023      Component Value Date/Time   NA 139 12/02/2023 0811   NA 135 08/18/2023 1205   K 4.4 12/02/2023 0811   CL 102 12/02/2023 0811   CO2 29 12/02/2023 0811   GLUCOSE 188 (H) 12/02/2023 0811   BUN 15 12/02/2023 0811   BUN 15 08/18/2023 1205   CREATININE 0.86 12/02/2023 0811   CREATININE 0.79 09/14/2023 0940   CALCIUM  9.2 12/02/2023 0811   PROT 6.7 12/02/2023 0811   PROT 5.7 (L) 08/18/2023 1205   ALBUMIN  4.2 12/02/2023 0811   ALBUMIN  3.7 (L) 08/18/2023 1205   AST 19 12/02/2023 0811   ALT 29 12/02/2023 0811   ALKPHOS 130 (H) 12/02/2023 0811   BILITOT 0.4 12/02/2023 0811   BILITOT <0.2 08/18/2023 1205   GFRNONAA >60 08/13/2023 0450   GFRNONAA 89 04/22/2015 0828   GFRAA 85 10/04/2019 1433   GFRAA >89 04/22/2015 0828      Latest Ref Rng & Units 12/02/2023    8:11 AM 09/14/2023    9:40 AM 08/18/2023   12:05 PM  BMP  Glucose 70 - 99 mg/dL 811  697  790   BUN 6 - 23 mg/dL 15  16  15    Creatinine 0.40 - 1.50 mg/dL 9.13  9.20  9.24   BUN/Creat Ratio 6 - 22 (calc)  SEE  NOTE:  20   Sodium 135 - 145 mEq/L 139  136  135   Potassium 3.5 - 5.1 mEq/L 4.4  4.8  4.7   Chloride 96 - 112 mEq/L 102  98  95   CO2 19 - 32 mEq/L 29  32  26   Calcium  8.4 - 10.5 mg/dL 9.2  9.7  9.4        Component Value Date/Time   WBC 14.1 (H) 12/02/2023 0811   RBC 5.12 12/02/2023 0811   HGB 15.9 12/02/2023 0811   HGB 13.8 08/18/2023 1205   HCT 46.8 12/02/2023 0811   HCT 41.0 08/18/2023 1205   PLT 429.0 (H) 12/02/2023 0811   PLT 484 (H) 08/18/2023 1205   MCV 91.4 12/02/2023 0811   MCV 94 08/18/2023 1205   MCH 31.7 08/18/2023 1205   MCH 31.7 08/12/2023 0257   MCHC 34.1 12/02/2023 0811   RDW 13.9 12/02/2023 0811   RDW 12.4 08/18/2023 1205   LYMPHSABS 3.7 (H) 08/18/2023 1205   MONOABS 0.7 07/07/2023 1310   EOSABS 0.1 08/18/2023 1205   BASOSABS 0.1 08/18/2023 1205     Parts  of this note may have been dictated using voice recognition software. There may be variances in spelling and vocabulary which are unintentional. Not all errors are proofread. Please notify the dino if any discrepancies are noted or if the meaning of any statement is not clear.

## 2024-02-29 NOTE — Patient Instructions (Addendum)
 Will recommend the following: Synjardy  XR 11/998 mg qd  Semglee  40 units qam to target fasting blood sugar at 100-150 Novolog  12-15 units tid 15 min before meals Correction scale: Use in addition to your meal time/short acting insulin  (both together) based on blood sugars as follows:  151 - 180: 1 unit 181 - 210: 2 units 211 - 240: 3 units 241 - 270: 4 units 271 - 300: 5 units 301 - 330: 6 units 331 - 360: 7 units 361 - 390: 8 units 391 - 420/High : 9 units

## 2024-03-29 ENCOUNTER — Encounter: Payer: Self-pay | Admitting: Family Medicine

## 2024-03-29 ENCOUNTER — Ambulatory Visit: Payer: MEDICAID | Admitting: Family Medicine

## 2024-03-29 VITALS — BP 122/80 | HR 61 | Temp 98.1°F | Ht 70.0 in | Wt 193.2 lb

## 2024-03-29 DIAGNOSIS — R109 Unspecified abdominal pain: Secondary | ICD-10-CM

## 2024-03-29 DIAGNOSIS — G8929 Other chronic pain: Secondary | ICD-10-CM | POA: Diagnosis not present

## 2024-03-29 DIAGNOSIS — Z789 Other specified health status: Secondary | ICD-10-CM

## 2024-03-29 DIAGNOSIS — R52 Pain, unspecified: Secondary | ICD-10-CM

## 2024-03-29 DIAGNOSIS — F411 Generalized anxiety disorder: Secondary | ICD-10-CM

## 2024-03-29 DIAGNOSIS — K861 Other chronic pancreatitis: Secondary | ICD-10-CM | POA: Diagnosis not present

## 2024-03-29 MED ORDER — OXYCODONE HCL 10 MG PO TABS: 10.0000 mg | ORAL_TABLET | Freq: Four times a day (QID) | ORAL | 0 refills | Status: DC | PRN

## 2024-03-29 MED ORDER — BUSPIRONE HCL 7.5 MG PO TABS: 7.5000 mg | ORAL_TABLET | Freq: Three times a day (TID) | ORAL | 1 refills | Status: DC

## 2024-03-29 NOTE — Progress Notes (Signed)
 Subjective: CC: Chronic pain management PCP: Jolinda Norene HERO, DO YEP:Steve Romero is a 53 y.o. male presenting to clinic today for:  Biliary pancreatitis, chronic w/ DM2 and now anxiety Patient reports that he has had a recent flareup where he required his pain medication a couple of weeks ago.  He uses [facility] for constipation management.  Reports no concerns from a pain standpoint but does report some increasing anxiety symptoms that have been present for the last couple of weeks.  No reports of any inciting events.  Should be having his biliary stent removed soon.  Continues to eat carbohydrates and has seen some blood sugars as high as 200s.  Has some low blood sugars noted on the CGM but does not do a fingerstick confirmation.  Continues to follow-up with endocrinology with a next visit in November.  Injecting 40 units of Semglee  daily with 12 to 14 units of NovoLog  with meals per sliding scale   ROS: Per HPI  Allergies  Allergen Reactions   Desipramine Nausea Only and Rash   Past Medical History:  Diagnosis Date   Acute pancreatitis without necrosis or infection, unspecified    AKI (acute kidney injury) 05/17/2022   Anxiety 10/2014   Ascites    Bipolar disorder (HCC) age 48   COPD (chronic obstructive pulmonary disease) (HCC) 2016   Depression age 69   DKA (diabetic ketoacidosis) (HCC) 08/11/2023   Enlarged prostate    Hyperlipidemia 2016   Hypertension 2013   Nausea vomiting and diarrhea 06/15/2022   Schizoaffective disorder (HCC) 11/13/2014   Sleep apnea    Does not wear c-pap, sleeps in sitting up position per pt   Thyroid  disease 11/2014    Current Outpatient Medications:    Accu-Chek Softclix Lancets lancets, Check BS in the morning and at night Dx E11.9, Disp: 200 each, Rfl: 3   acetaminophen  (TYLENOL ) 500 MG tablet, Take 2 tablets (1,000 mg total) by mouth every 8 (eight) hours as needed for mild pain (pain score 1-3)., Disp: , Rfl:    albuterol  (VENTOLIN   HFA) 108 (90 Base) MCG/ACT inhaler, Inhale 2 puffs into the lungs every 6 (six) hours as needed for wheezing or shortness of breath., Disp: 8 g, Rfl: 0   aspirin  EC 81 MG tablet, Take 81-162 mg by mouth daily as needed (for pain). Swallow whole., Disp: , Rfl:    Continuous Blood Gluc Receiver (FREESTYLE LIBRE READER) DEVI, 1 Units by Does not apply route daily. UAD to test BGs daily. Dx E11.9, Disp: 1 each, Rfl: 1   Continuous Glucose Receiver (FREESTYLE LIBRE 3 READER) DEVI, 1 Device by Does not apply route continuous., Disp: 1 each, Rfl: 0   Continuous Glucose Sensor (FREESTYLE LIBRE 3 PLUS SENSOR) MISC, Check BGs continuously. E11.9 Change sensor every 15 days., Disp: 6 each, Rfl: 3   Empagliflozin-metFORMIN HCl ER (SYNJARDY  XR) 11-998 MG TB24, Take 1 tablet by mouth 2 (two) times daily., Disp: 60 tablet, Rfl: 1   Glucagon  (BAQSIMI  ONE PACK) 3 MG/DOSE POWD, Place 1 Device into the nose as needed (Low blood sugar with impaired consciousness)., Disp: 2 each, Rfl: 3   glucose blood (ACCU-CHEK GUIDE TEST) test strip, Check BS in the morning and at night Dx E11.9, Disp: 200 each, Rfl: 3   insulin  aspart (NOVOLOG  FLEXPEN) 100 UNIT/ML FlexPen, 10 units tidac + sliding scale, max dose 60 units a day, Disp: 21 mL, Rfl: 3   insulin  glargine (LANTUS ) 100 UNIT/ML Solostar Pen, Inject 50-60 Units into  the skin daily., Disp: , Rfl:    Insulin  Pen Needle (BD PEN NEEDLE NANO 2ND GEN) 32G X 4 MM MISC, UAD to administer insulin  Dx E11.10, Disp: 100 each, Rfl: 3   lipase/protease/amylase (CREON ) 36000 UNITS CPEP capsule, Take 3 capsules with meals and 1-2  capsule with snacks ( up too 2 snacks ) (Patient taking differently: Take 36,000-108,000 Units by mouth See admin instructions. Take 108,000-144,000 units by mouth with each meal eaten and 72,000 units with each snack), Disp: 570 capsule, Rfl: 2   naloxone  (NARCAN ) nasal spray 4 mg/0.1 mL, Place 1 spray into the nose as directed., Disp: , Rfl:    ondansetron  (ZOFRAN )  4 MG tablet, Take 4 mg by mouth every 6 (six) hours as needed for nausea or vomiting., Disp: , Rfl:    Oxycodone  HCl 10 MG TABS, Take 1 tablet (10 mg total) by mouth every 6 (six) hours as needed (for pain). To last 3 months, Disp: 90 tablet, Rfl: 0   rosuvastatin  (CRESTOR ) 10 MG tablet, Take 1 tablet (10 mg total) by mouth daily., Disp: 90 tablet, Rfl: 3   SEMGLEE , YFGN, 100 UNIT/ML Pen, Inject 40 Units into the skin daily., Disp: 36 mL, Rfl: 3 Social History   Socioeconomic History   Marital status: Single    Spouse name: Not on file   Number of children: 0   Years of education: Not on file   Highest education level: GED or equivalent  Occupational History   Not on file  Tobacco Use   Smoking status: Every Day    Current packs/day: 0.25    Average packs/day: 0.3 packs/day for 26.0 years (6.5 ttl pk-yrs)    Types: Cigarettes   Smokeless tobacco: Never   Tobacco comments:    4-5 cig daily as of 10/07/2020.  Vaping Use   Vaping status: Never Used  Substance and Sexual Activity   Alcohol use: No   Drug use: No   Sexual activity: Never    Birth control/protection: None  Other Topics Concern   Not on file  Social History Narrative   Not on file   Social Drivers of Health   Financial Resource Strain: Low Risk  (07/27/2023)   Overall Financial Resource Strain (CARDIA)    Difficulty of Paying Living Expenses: Not very hard  Food Insecurity: No Food Insecurity (08/16/2023)   Hunger Vital Sign    Worried About Running Out of Food in the Last Year: Never true    Ran Out of Food in the Last Year: Never true  Recent Concern: Food Insecurity - Food Insecurity Present (07/27/2023)   Hunger Vital Sign    Worried About Running Out of Food in the Last Year: Never true    Ran Out of Food in the Last Year: Sometimes true  Transportation Needs: No Transportation Needs (08/16/2023)   PRAPARE - Administrator, Civil Service (Medical): No    Lack of Transportation (Non-Medical): No   Physical Activity: Insufficiently Active (07/27/2023)   Exercise Vital Sign    Days of Exercise per Week: 2 days    Minutes of Exercise per Session: 10 min  Stress: No Stress Concern Present (07/27/2023)   [employer] of Occupational Health - Occupational Stress Questionnaire    Feeling of Stress : Only a little  Social Connections: Unknown (07/27/2023)   Social Connection and Isolation Panel    Frequency of Communication with Friends and Family: Three times a week    Frequency of Social Gatherings  with Friends and Family: Once a week    Attends Religious Services: Patient declined    Active Member of Clubs or Organizations: No    Attends Engineer, structural: Not on file    Marital Status: Divorced  Intimate Partner Violence: Not At Risk (08/16/2023)   Humiliation, Afraid, Rape, and Kick questionnaire    Fear of Current or Ex-Partner: No    Emotionally Abused: No    Physically Abused: No    Sexually Abused: No   Family History  Problem Relation Age of Onset   Diabetes Father    Heart disease Father    Heart disease Maternal Grandmother    Stroke Maternal Grandfather    Colon cancer Neg Hx    Esophageal cancer Neg Hx    Inflammatory bowel disease Neg Hx    Liver disease Neg Hx    Pancreatic cancer Neg Hx    Rectal cancer Neg Hx    Stomach cancer Neg Hx     Objective: Office vital signs reviewed. BP 122/80   Pulse 61   Temp 98.1 F (36.7 C)   Ht 5' 10 (1.778 m)   Wt 193 lb 4 oz (87.7 kg)   SpO2 96%   BMI 27.73 kg/m   Physical Examination:  General: Awake, alert, nontoxic, smells of tobacco, No acute distress HEENT: sclera white, MMM Cardio: regular rate and rhythm, S1S2 heard, no murmurs appreciated Pulm: clear to auscultation bilaterally, no wheezes, rhonchi or rales; normal work of breathing on room air GI: soft, ND, +epigastric and RUQ TTP present. No guarding. No palpable masses     03/29/2024    1:10 PM 12/28/2023    9:18 AM 09/27/2023    11:40 AM  Depression screen PHQ 2/9  Decreased Interest 0 0 0  Down, Depressed, Hopeless 0 0 0  PHQ - 2 Score 0 0 0  Altered sleeping 0 0 1  Tired, decreased energy 0 0 1  Change in appetite 1 0 2  Feeling bad or failure about yourself  1 0 0  Trouble concentrating 2 0 0  Moving slowly or fidgety/restless 3 0 2  Suicidal thoughts 0 0 0  PHQ-9 Score 7 0 6  Difficult doing work/chores Somewhat difficult Not difficult at all Somewhat difficult      03/29/2024    1:11 PM 12/28/2023    9:18 AM 09/27/2023   11:39 AM 09/27/2023   11:29 AM  GAD 7 : Generalized Anxiety Score  Nervous, Anxious, on Edge 3 0 0 0  Control/stop worrying 1 0 0 0  Worry too much - different things 1 0 0 0  Trouble relaxing 3 0 1 0  Restless 3 0 0 0  Easily annoyed or irritable 2 0 1 0  Afraid - awful might happen 0 0 1 0  Total GAD 7 Score 13 0 3 0  Anxiety Difficulty Somewhat difficult Not difficult at all Somewhat difficult Not difficult at all    Assessment/ Plan: 53 y.o. male   Chronic biliary pancreatitis (HCC) - Plan: Oxycodone  HCl 10 MG TABS  Chronic abdominal pain - Plan: Oxycodone  HCl 10 MG TABS  Pain management - Plan: Oxycodone  HCl 10 MG TABS  Generalized anxiety disorder - Plan: busPIRone  (BUSPAR ) 7.5 MG tablet  Hepatitis B vaccination status unknown - Plan: Hepatitis B surface antibody,quantitative   Up-to-date on UDS and CSC.  The national narcotic database reviewed with no red flags.  Having some anxiety symptoms so BuSpar  ordered.  Discussed  benzodiazepines were not appropriate treatment given chronic use of opioid.  Patient was amenable to this.  Consider referral to integrated peripheral health if needed going forward.  Will follow-up in 3 months unless he needs me sooner  Additionally, declined influenza vaccination   Norene CHRISTELLA Fielding, DO [facility] [phone]

## 2024-03-30 ENCOUNTER — Other Ambulatory Visit (HOSPITAL_COMMUNITY): Payer: Self-pay

## 2024-03-30 ENCOUNTER — Telehealth: Payer: Self-pay

## 2024-03-30 NOTE — Telephone Encounter (Signed)
 Pharmacy Patient Advocate Encounter   Received notification from Onbase that prior authorization for oxyCODONE  HCl 10MG  tablets  is required/requested.   Insurance verification completed.   The patient is insured through [employer] .   Per test claim: PA required; PA submitted to above mentioned insurance via Latent Key/confirmation #/EOC [id number] Status is pending

## 2024-03-31 ENCOUNTER — Other Ambulatory Visit (HOSPITAL_COMMUNITY): Payer: Self-pay

## 2024-03-31 NOTE — Telephone Encounter (Signed)
 Pharmacy Patient Advocate Encounter  Received notification from [facility] that Prior Authorization for oxyCODONE  HCl 10MG  tablets  has been APPROVED from 03/30/24 to 03/31/25. Ran test claim, Copay is $4. This test claim was processed through [facility]- copay amounts may vary at other pharmacies due to pharmacy/plan contracts, or as the patient moves through the different stages of their insurance plan.   PA #/Case ID/Reference #: [id number]

## 2024-04-12 ENCOUNTER — Telehealth: Payer: Self-pay | Admitting: Gastroenterology

## 2024-04-12 NOTE — Telephone Encounter (Signed)
 Inbound call from patient requesting a call to discuss scheduling recall ERCP. Please advise, thank you

## 2024-04-12 NOTE — Telephone Encounter (Signed)
 Please schedule accordingly. Thanks. GM

## 2024-04-12 NOTE — Telephone Encounter (Signed)
 Patient is ready to schedule his ERCP. Okay for scheduling?

## 2024-04-13 ENCOUNTER — Other Ambulatory Visit (HOSPITAL_COMMUNITY): Payer: Self-pay

## 2024-04-13 ENCOUNTER — Other Ambulatory Visit: Payer: Self-pay

## 2024-04-13 ENCOUNTER — Telehealth: Payer: Self-pay

## 2024-04-13 DIAGNOSIS — K831 Obstruction of bile duct: Secondary | ICD-10-CM

## 2024-04-13 NOTE — Telephone Encounter (Signed)
ERCP scheduled, pt instructed and medications reviewed.  Patient instructions mailed to home and sent to My Chart.  Patient to call with any questions or concerns.

## 2024-04-13 NOTE — Telephone Encounter (Signed)
 ERCP has been set up for 06/12/24 at [facility] with GM

## 2024-04-13 NOTE — Telephone Encounter (Signed)
 Pharmacy Patient Advocate Encounter   Received notification from CoverMyMeds that prior authorization for FreeStyle Libre 3 Plus Sensor  is required/requested.   Insurance verification completed.   The patient is insured through [employer].   Per test claim: PA required; PA submitted to above mentioned insurance via Latent Key/confirmation #/EOC [id number] Status is pending

## 2024-04-13 NOTE — Telephone Encounter (Signed)
 Steve Romero, would you add him to your list please?

## 2024-04-14 ENCOUNTER — Other Ambulatory Visit (HOSPITAL_COMMUNITY): Payer: Self-pay

## 2024-04-14 NOTE — Telephone Encounter (Signed)
 Pharmacy Patient Advocate Encounter  Received notification from [facility] that Prior Authorization for FreeStyle Libre 3 Plus Sensor  has been APPROVED from 04/13/24 to 04/13/25. Unable to obtain price due to refill too soon rejection, last fill date 03/25/24 next available fill date10/13/25   PA #/Case ID/Reference #: [id number]

## 2024-04-21 ENCOUNTER — Telehealth: Payer: Self-pay

## 2024-04-21 ENCOUNTER — Other Ambulatory Visit: Payer: Self-pay | Admitting: Family Medicine

## 2024-04-21 DIAGNOSIS — E119 Type 2 diabetes mellitus without complications: Secondary | ICD-10-CM

## 2024-04-21 MED ORDER — FREESTYLE LIBRE 3 PLUS SENSOR MISC: 3 refills | Status: AC

## 2024-04-21 NOTE — Telephone Encounter (Signed)
 Copied from CRM [id number]. Topic: General - Other >> Apr 21, 2024  9:47 AM Donna BRAVO wrote: Reason for CRM: patient stopped taking busPIRone  (BUSPAR ) 7.5 MG tablet  4 days ago.  Medication was making him sick, where he would have to lay down. Anxiety is the same.

## 2024-04-21 NOTE — Telephone Encounter (Signed)
 Pt aware of provider feedback and wanted to schedule for Monday morning due to transportation and he can't do video visit. Pt scheduled 10/20 at 8:30 with PCP and pt also needed refills on Freestyle so sent to pharmacy as requested.

## 2024-04-21 NOTE — Telephone Encounter (Signed)
 Thank you for letting me know.  Should he decide that he wants to go on a different medication, please have him schedule a visit.  Glad to see him today if he would like as we have several openings this afternoon.

## 2024-04-21 NOTE — Telephone Encounter (Signed)
 Per telephone encounter, Prescription needs to be writtin for [employer] 3 Pluse  Due to  insurance.

## 2024-04-21 NOTE — Addendum Note (Signed)
 Addended by: RANDINE ARNULFO MATSU on: 04/21/2024 12:44 PM   Modules accepted: Orders

## 2024-04-21 NOTE — Telephone Encounter (Unsigned)
 Copied from CRM [id number]. Topic: Clinical - Medication Refill >> Apr 21, 2024  9:42 AM Donna BRAVO wrote: Medication:  Continuous Glucose Receiver (FREESTYLE LIBRE 3 READER) DEVI   Prescription needs to be writtin for Freestyle Libre 3 Pluse  Due to  insurance   Has the patient contacted their pharmacy? Yes Was told to call provider for refill  This is the patient's preferred pharmacy:   [facility] - [place], [place] - [address] [place] [address] Phone: [phone] Fax: [phone]  Is this the correct pharmacy for this prescription? Yes If no, delete pharmacy and type the correct one.   Has the prescription been filled recently? Yes  Is the patient out of the medication? Yes  Has the patient been seen for an appointment in the last year OR does the patient have an upcoming appointment? Yes  Can we respond through MyChart? Yes  Agent: Please be advised that Rx refills may take up to 3 business days. We ask that you follow-up with your pharmacy.

## 2024-04-24 ENCOUNTER — Encounter: Payer: Self-pay | Admitting: Family Medicine

## 2024-04-24 ENCOUNTER — Ambulatory Visit: Payer: MEDICAID | Admitting: Family Medicine

## 2024-04-24 VITALS — BP 129/84 | HR 62 | Temp 98.0°F | Ht 70.0 in | Wt 187.2 lb

## 2024-04-24 DIAGNOSIS — F411 Generalized anxiety disorder: Secondary | ICD-10-CM | POA: Diagnosis not present

## 2024-04-24 MED ORDER — HYDROXYZINE HCL 25 MG PO TABS: 12.5000 mg | ORAL_TABLET | Freq: Three times a day (TID) | ORAL | 2 refills | Status: DC | PRN

## 2024-04-24 NOTE — Progress Notes (Signed)
 Subjective: CC: Follow-up generalized anxiety disorder PCP: Jolinda Norene HERO, DO YEP:Steve Romero is a 53 y.o. male presenting to clinic today for:  Generalized anxiety disorder Patient was seen about 4 weeks ago for anxiety disorder.  He was started on BuSpar  but notes that after about a week he started having nausea where he had to lay down and then every time he sat up he would get nauseated again.  He stopped it for couple of days and symptoms resolved they tried going back to the medication because he wanted to make sure that this was in fact a medicine induced and not due to his underlying pancreatitis.  He had returned of the symptoms shortly after restarting the medication and so has totally discontinued the BuSpar .  He is here to discuss alternative regimens.  Would still like to proceed with a as needed medication if possible.  He continues to have ongoing GI issues and has ERCP scheduled for December with plans for stent removal.   ROS: Per HPI  Allergies  Allergen Reactions   Desipramine Nausea Only and Rash   Past Medical History:  Diagnosis Date   Acute pancreatitis without necrosis or infection, unspecified    AKI (acute kidney injury) 05/17/2022   Anxiety 10/2014   Ascites    Bipolar disorder (HCC) age 84   COPD (chronic obstructive pulmonary disease) (HCC) 2016   Depression age 30   DKA (diabetic ketoacidosis) (HCC) 08/11/2023   Enlarged prostate    Hyperlipidemia 2016   Hypertension 2013   Nausea vomiting and diarrhea 06/15/2022   Schizoaffective disorder (HCC) 11/13/2014   Sleep apnea    Does not wear c-pap, sleeps in sitting up position per pt   Thyroid  disease 11/2014    Current Outpatient Medications:    Accu-Chek Softclix Lancets lancets, Check BS in the morning and at night Dx E11.9, Disp: 200 each, Rfl: 3   acetaminophen  (TYLENOL ) 500 MG tablet, Take 2 tablets (1,000 mg total) by mouth every 8 (eight) hours as needed for mild pain (pain score  1-3)., Disp: , Rfl:    albuterol  (VENTOLIN  HFA) 108 (90 Base) MCG/ACT inhaler, Inhale 2 puffs into the lungs every 6 (six) hours as needed for wheezing or shortness of breath., Disp: 8 g, Rfl: 0   aspirin  EC 81 MG tablet, Take 81-162 mg by mouth daily as needed (for pain). Swallow whole., Disp: , Rfl:    busPIRone  (BUSPAR ) 7.5 MG tablet, Take 1 tablet (7.5 mg total) by mouth 3 (three) times daily., Disp: 270 tablet, Rfl: 1   Continuous Blood Gluc Receiver (FREESTYLE LIBRE READER) DEVI, 1 Units by Does not apply route daily. UAD to test BGs daily. Dx E11.9, Disp: 1 each, Rfl: 1   Continuous Glucose Receiver (FREESTYLE LIBRE 3 READER) DEVI, 1 Device by Does not apply route continuous., Disp: 1 each, Rfl: 0   Continuous Glucose Sensor (FREESTYLE LIBRE 3 PLUS SENSOR) MISC, Check BGs continuously. E11.9 Change sensor every 15 days., Disp: 6 each, Rfl: 3   Empagliflozin-metFORMIN HCl ER (SYNJARDY  XR) 11-998 MG TB24, Take 1 tablet by mouth 2 (two) times daily., Disp: 60 tablet, Rfl: 1   Glucagon  (BAQSIMI  ONE PACK) 3 MG/DOSE POWD, Place 1 Device into the nose as needed (Low blood sugar with impaired consciousness)., Disp: 2 each, Rfl: 3   glucose blood (ACCU-CHEK GUIDE TEST) test strip, Check BS in the morning and at night Dx E11.9, Disp: 200 each, Rfl: 3   insulin  aspart (NOVOLOG  FLEXPEN) 100  UNIT/ML FlexPen, 10 units tidac + sliding scale, max dose 60 units a day, Disp: 21 mL, Rfl: 3   insulin  glargine (LANTUS ) 100 UNIT/ML Solostar Pen, Inject 50-60 Units into the skin daily., Disp: , Rfl:    Insulin  Pen Needle (BD PEN NEEDLE NANO 2ND GEN) 32G X 4 MM MISC, UAD to administer insulin  Dx E11.10, Disp: 100 each, Rfl: 3   lipase/protease/amylase (CREON ) 36000 UNITS CPEP capsule, Take 3 capsules with meals and 1-2  capsule with snacks ( up too 2 snacks ) (Patient taking differently: Take 36,000-108,000 Units by mouth See admin instructions. Take 108,000-144,000 units by mouth with each meal eaten and 72,000 units  with each snack), Disp: 570 capsule, Rfl: 2   naloxone  (NARCAN ) nasal spray 4 mg/0.1 mL, Place 1 spray into the nose as directed., Disp: , Rfl:    ondansetron  (ZOFRAN ) 4 MG tablet, Take 4 mg by mouth every 6 (six) hours as needed for nausea or vomiting. (Patient not taking: Reported on 03/29/2024), Disp: , Rfl:    Oxycodone  HCl 10 MG TABS, Take 1 tablet (10 mg total) by mouth every 6 (six) hours as needed (for pain). To last 3 months, Disp: 90 tablet, Rfl: 0   rosuvastatin  (CRESTOR ) 10 MG tablet, Take 1 tablet (10 mg total) by mouth daily., Disp: 90 tablet, Rfl: 3   SEMGLEE , YFGN, 100 UNIT/ML Pen, Inject 40 Units into the skin daily., Disp: 36 mL, Rfl: 3 Social History   Socioeconomic History   Marital status: Single    Spouse name: Not on file   Number of children: 0   Years of education: Not on file   Highest education level: GED or equivalent  Occupational History   Not on file  Tobacco Use   Smoking status: Every Day    Current packs/day: 0.25    Average packs/day: 0.3 packs/day for 26.0 years (6.5 ttl pk-yrs)    Types: Cigarettes   Smokeless tobacco: Never   Tobacco comments:    4-5 cig daily as of 10/07/2020.  Vaping Use   Vaping status: Never Used  Substance and Sexual Activity   Alcohol use: No   Drug use: No   Sexual activity: Never    Birth control/protection: None  Other Topics Concern   Not on file  Social History Narrative   Not on file   Social Drivers of Health   Financial Resource Strain: Low Risk  (07/27/2023)   Overall Financial Resource Strain (CARDIA)    Difficulty of Paying Living Expenses: Not very hard  Food Insecurity: No Food Insecurity (08/16/2023)   Hunger Vital Sign    Worried About Running Out of Food in the Last Year: Never true    Ran Out of Food in the Last Year: Never true  Recent Concern: Food Insecurity - Food Insecurity Present (07/27/2023)   Hunger Vital Sign    Worried About Running Out of Food in the Last Year: Never true    Ran Out of  Food in the Last Year: Sometimes true  Transportation Needs: No Transportation Needs (08/16/2023)   PRAPARE - Administrator, Civil Service (Medical): No    Lack of Transportation (Non-Medical): No  Physical Activity: Insufficiently Active (07/27/2023)   Exercise Vital Sign    Days of Exercise per Week: 2 days    Minutes of Exercise per Session: 10 min  Stress: No Stress Concern Present (07/27/2023)   [employer] of Occupational Health - Occupational Stress Questionnaire    Feeling of  Stress : Only a little  Social Connections: Unknown (07/27/2023)   Social Connection and Isolation Panel    Frequency of Communication with Friends and Family: Three times a week    Frequency of Social Gatherings with Friends and Family: Once a week    Attends Religious Services: Patient declined    Database administrator or Organizations: No    Attends Engineer, structural: Not on file    Marital Status: Divorced  Intimate Partner Violence: Not At Risk (08/16/2023)   Humiliation, Afraid, Rape, and Kick questionnaire    Fear of Current or Ex-Partner: No    Emotionally Abused: No    Physically Abused: No    Sexually Abused: No   Family History  Problem Relation Age of Onset   Diabetes Father    Heart disease Father    Heart disease Maternal Grandmother    Stroke Maternal Grandfather    Colon cancer Neg Hx    Esophageal cancer Neg Hx    Inflammatory bowel disease Neg Hx    Liver disease Neg Hx    Pancreatic cancer Neg Hx    Rectal cancer Neg Hx    Stomach cancer Neg Hx     Objective: Office vital signs reviewed. BP 129/84   Pulse 62   Temp 98 F (36.7 C)   Ht 5' 10 (1.778 m)   Wt 187 lb 4 oz (84.9 kg)   SpO2 98%   BMI 26.87 kg/m   Physical Examination:  General: Awake, alert, nontoxic male, No acute distress HEENT: Sclera white.  Moist mucous membranes     03/29/2024    1:10 PM 12/28/2023    9:18 AM 09/27/2023   11:40 AM  Depression screen PHQ 2/9   Decreased Interest 0 0 0  Down, Depressed, Hopeless 0 0 0  PHQ - 2 Score 0 0 0  Altered sleeping 0 0 1  Tired, decreased energy 0 0 1  Change in appetite 1 0 2  Feeling bad or failure about yourself  1 0 0  Trouble concentrating 2 0 0  Moving slowly or fidgety/restless 3 0 2  Suicidal thoughts 0 0 0  PHQ-9 Score 7 0 6  Difficult doing work/chores Somewhat difficult Not difficult at all Somewhat difficult      03/29/2024    1:11 PM 12/28/2023    9:18 AM 09/27/2023   11:39 AM 09/27/2023   11:29 AM  GAD 7 : Generalized Anxiety Score  Nervous, Anxious, on Edge 3 0 0 0  Control/stop worrying 1 0 0 0  Worry too much - different things 1 0 0 0  Trouble relaxing 3 0 1 0  Restless 3 0 0 0  Easily annoyed or irritable 2 0 1 0  Afraid - awful might happen 0 0 1 0  Total GAD 7 Score 13 0 3 0  Anxiety Difficulty Somewhat difficult Not difficult at all Somewhat difficult Not difficult at all   He declined to complete the PHQ and GAD-7 today  Assessment/ Plan: 53 y.o. male   Generalized anxiety disorder - Plan: hydrOXYzine (ATARAX) 25 MG tablet   Discussed starting Zoloft versus hydroxyzine.  Will start hydroxyzine and he will message me via MyChart in 1 week to let me know how the medication is going.  I cautioned him this may cause sedation he should consider using 1/2 tablet to begin with.  He voiced understanding of the plan and will reach out to me if any concerns arise.  He has 80-month follow-up scheduled already in January   Masiah Woody M Chassie Pennix, DO [facility] [phone]

## 2024-04-25 NOTE — Telephone Encounter (Signed)
 LMOVM to make sure that pt has everything straight and that he needs. The [place] 3 plus sensors were sent in on 04/21/24. There is no Libre 3 plus reader, it is only a Therapist, art 3 reader. If he has everything straight there is no need to call us  back. If he needs anything else please let us  know.

## 2024-05-02 ENCOUNTER — Other Ambulatory Visit (HOSPITAL_COMMUNITY): Payer: Self-pay

## 2024-05-09 ENCOUNTER — Ambulatory Visit (INDEPENDENT_AMBULATORY_CARE_PROVIDER_SITE_OTHER): Payer: MEDICAID | Admitting: "Endocrinology

## 2024-05-09 ENCOUNTER — Other Ambulatory Visit: Payer: Self-pay

## 2024-05-09 ENCOUNTER — Encounter: Payer: Self-pay | Admitting: "Endocrinology

## 2024-05-09 ENCOUNTER — Telehealth: Payer: Self-pay | Admitting: "Endocrinology

## 2024-05-09 VITALS — BP 120/70 | HR 72 | Ht 70.0 in | Wt 192.0 lb

## 2024-05-09 DIAGNOSIS — E7800 Pure hypercholesterolemia, unspecified: Secondary | ICD-10-CM

## 2024-05-09 DIAGNOSIS — E1165 Type 2 diabetes mellitus with hyperglycemia: Secondary | ICD-10-CM

## 2024-05-09 DIAGNOSIS — E11649 Type 2 diabetes mellitus with hypoglycemia without coma: Secondary | ICD-10-CM

## 2024-05-09 DIAGNOSIS — Z794 Long term (current) use of insulin: Secondary | ICD-10-CM

## 2024-05-09 DIAGNOSIS — Z7984 Long term (current) use of oral hypoglycemic drugs: Secondary | ICD-10-CM

## 2024-05-09 DIAGNOSIS — Z91148 Patient's other noncompliance with medication regimen for other reason: Secondary | ICD-10-CM

## 2024-05-09 LAB — POCT GLYCOSYLATED HEMOGLOBIN (HGB A1C): Hemoglobin A1C: 10.5 % — AB (ref 4.0–5.6)

## 2024-05-09 MED ORDER — INSULIN GLARGINE 100 UNIT/ML SOLOSTAR PEN: 45.0000 [IU] | PEN_INJECTOR | Freq: Every day | SUBCUTANEOUS | 3 refills | Status: DC

## 2024-05-09 MED ORDER — SEMGLEE (YFGN) 100 UNIT/ML ~~LOC~~ SOPN
46.0000 [IU] | PEN_INJECTOR | Freq: Every day | SUBCUTANEOUS | 3 refills | Status: DC
  Filled 2024-05-09: qty 42, 91d supply, fill #0

## 2024-05-09 MED ORDER — NOVOLOG FLEXPEN 100 UNIT/ML ~~LOC~~ SOPN
PEN_INJECTOR | SUBCUTANEOUS | 3 refills | Status: DC
  Filled 2024-05-09: qty 42, 60d supply, fill #0

## 2024-05-09 MED ORDER — INSULIN GLARGINE 100 UNIT/ML SOLOSTAR PEN
45.0000 [IU] | PEN_INJECTOR | Freq: Every day | SUBCUTANEOUS | 3 refills | Status: DC
  Filled 2024-05-09: qty 39, 86d supply, fill #0

## 2024-05-09 MED ORDER — NOVOLOG FLEXPEN 100 UNIT/ML ~~LOC~~ SOPN
PEN_INJECTOR | SUBCUTANEOUS | 3 refills | Status: DC
  Filled 2024-05-09: qty 42, fill #0

## 2024-05-09 NOTE — Progress Notes (Signed)
 Outpatient Endocrinology Note [name], MD  05/09/24   Steve Romero 53-Nov-1972 [id number]  Referring Provider: Jolinda Norene HERO, DO Primary Care Provider: Jolinda Norene HERO, DO Reason for consultation: Subjective   Assessment & Plan  Diagnoses and all orders for this visit:  Uncontrolled type 2 diabetes mellitus with hyperglycemia (HCC) -     POCT glycosylated hemoglobin (Hb A1C) -     Discontinue: SEMGLEE , YFGN, 100 UNIT/ML Pen; Inject 46 Units into the skin daily. -     insulin  aspart (NOVOLOG  FLEXPEN) 100 UNIT/ML FlexPen; Inject 12-15 units three times daily before meals + sliding scale, max dose 70 units a day -     Discontinue: SEMGLEE , YFGN, 100 UNIT/ML Pen; Inject 46 Units into the skin daily.  Non compliance w medication regimen  Insulin  dose changed (HCC)  Long term (current) use of oral hypoglycemic drugs  Long-term insulin  use (HCC)  Pure hypercholesterolemia  Other orders -     Discontinue: insulin  aspart (NOVOLOG  FLEXPEN) 100 UNIT/ML FlexPen; 12-15 units tidac + sliding scale, max dose 70 units a day -     insulin  glargine (LANTUS ) 100 UNIT/ML Solostar Pen; Inject 45 Units into the skin daily.   Diabetes Type II complicated by neuropathy,  Lab Results  Component Value Date   GFR 99.32 12/02/2023   Hba1c goal less than 7, current Hba1c is  Lab Results  Component Value Date   HGBA1C 10.5 (A) 05/09/2024   Will recommend the following: Synjardy  XR 11/998 mg qd  Lantus  45 units qam to target fasting blood sugar at 100-150 Novolog  12-15 units tid 15 min before meals Correction scale: Use in addition to your meal time/short acting insulin  (both together) based on blood sugars as follows:  151 - 180: 1 unit 181 - 210: 2 units 211 - 240: 3 units 241 - 270: 4 units 271 - 300: 5 units 301 - 330: 6 units 331 - 360: 7 units 361 - 390: 8 units 391 - 420/High : 9 units   11/09/23: Ordered diabetes education that patient got today, pt is  drinking sweet drinks all day leading to high sugars, was not taking 20 units but only correction scale, had a low in 10/2023, discussed hypoglycemia management again, with plans to try CeQur for being compliant with short acting insulin  as well as sticking to 1 drink a day instead of drinking all day the sugary beverages 11/16/23 patient again got diabetes education today.  Patient repeatedly says that he has been doing everything right, taking up to 6 shots of insulin  a: day, 2 of the Semglee  and up to 4 of the NovoLog  15 units with meals.  His blood sugars continue to stay high except rarely knows that patient keeps telling because of his pancreas.  Reinforced compliance and discussed the dangers of continuing dose elevation of Novolog  in case of noncompliance, patient understands and agreed 12/15/23: seen DM educator, using insulin  in scar tissue, drinking juices and mountain dew  01/03/24: ordered podiatry referral, r/o onychomycosis; discussed again about decreasing smoking 05/09/24: Recommend iLet beta bionic pump start as patient seems non-complaint; discussed with diabetes educator No known contraindications/side effects to any of above medications Glucagon  discussed and prescribed with refills on 09/2023  -Last LD and Tg are as follows: Lab Results  Component Value Date   LDLCALC 98 09/14/2023    Lab Results  Component Value Date   TRIG 72 09/14/2023   -stopped rosuvastatin  10 mg every day, reinforced it previously,  now taking it  -Follow low fat diet and exercise   -Blood pressure goal <140/90 - Microalbumin/creatinine goal is < 30 -Last MA/Cr is as follows: Lab Results  Component Value Date   MICROALBUR 0.5 09/14/2023   -not on ACE/ARB  -diet changes including salt restriction -limit eating outside -counseled BP targets per standards of diabetes care -uncontrolled blood pressure can lead to retinopathy, nephropathy and cardiovascular and atherosclerotic heart disease  Reviewed  and counseled on: -A1C target -Blood sugar targets -Complications of uncontrolled diabetes  -Checking blood sugar before meals and bedtime and bring log next visit -All medications with mechanism of action and side effects -Hypoglycemia management: rule of 15's, Glucagon  Emergency Kit and medical alert ID -low-carb low-fat plate-method diet -At least 20 minutes of physical activity per day -Annual dilated retinal eye exam and foot exam -compliance and follow up needs -follow up as scheduled or earlier if problem gets worse  Call if blood sugar is less than 70 or consistently above 250    Take a 15 gm snack of carbohydrate at bedtime before you go to sleep if your blood sugar is less than 100.    If you are going to fast after midnight for a test or procedure, ask your physician for instructions on how to reduce/decrease your insulin  dose.    Call if blood sugar is less than 70 or consistently above 250  -Treating a low sugar by rule of 15  (15 gms of sugar every 15 min until sugar is more than 70) If you feel your sugar is low, test your sugar to be sure If your sugar is low (less than 70), then take 15 grams of a fast acting Carbohydrate (3-4 glucose tablets or glucose gel or 4 ounces of juice or regular soda) Recheck your sugar 15 min after treating low to make sure it is more than 70 If sugar is still less than 70, treat again with 15 grams of carbohydrate          Don't drive the hour of hypoglycemia  If unconscious/unable to eat or drink by mouth, use glucagon  injection or nasal spray baqsimi  and call 911. Can repeat again in 15 min if still unconscious.  Return in about 4 weeks (around 06/06/2024).   I have reviewed current medications, nurse's notes, allergies, vital signs, past medical and surgical history, family medical history, and social history for this encounter. Counseled patient on symptoms, examination findings, lab findings, imaging results, treatment decisions and  monitoring and prognosis. The patient understood the recommendations and agrees with the treatment plan. All questions regarding treatment plan were fully answered.  [name], MD  05/09/24   History of Present Illness CORBIN FALCK is a 53 y.o. year old male who presents for follow up of Type II diabetes mellitus.  Home diabetes regimen: Synjardy  XR 11/998 mg every day-cannot tolerate bid  Semglee  40 units qd Novolog  10-12 units 15-20 min tidac, self changed the dose Not following correction scale accurately    COMPLICATIONS -  MI/Stroke -  retinopathy +  neuropathy -  nephropathy  BLOOD SUGAR DATA CGM interpretation: At today's visit, we reviewed her CGM downloads. The full report is scanned in the media. Reviewing the CGM trends, BG are elevated across the day.  Physical Exam  BP 120/70   Pulse 72   Ht 5' 10 (1.778 m)   Wt 192 lb (87.1 kg)   SpO2 94%   BMI 27.55 kg/m    Constitutional: well  developed, well nourished Head: normocephalic, atraumatic Eyes: sclera anicteric, no redness Neck: supple Lungs: normal respiratory effort Neurology: alert and oriented Skin: dry, no appreciable rashes Musculoskeletal: no appreciable defects Psychiatric: normal mood and affect Diabetic Foot Exam - Simple   No data filed      Current Medications Patient's Medications  New Prescriptions   INSULIN  GLARGINE (LANTUS ) 100 UNIT/ML SOLOSTAR PEN    Inject 45 Units into the skin daily.  Previous Medications   ACCU-CHEK SOFTCLIX LANCETS LANCETS    Check BS in the morning and at night Dx E11.9   ACETAMINOPHEN  (TYLENOL ) 500 MG TABLET    Take 2 tablets (1,000 mg total) by mouth every 8 (eight) hours as needed for mild pain (pain score 1-3).   ALBUTEROL  (VENTOLIN  HFA) 108 (90 BASE) MCG/ACT INHALER    Inhale 2 puffs into the lungs every 6 (six) hours as needed for wheezing or shortness of breath.   ASPIRIN  EC 81 MG TABLET    Take 81-162 mg by mouth daily as needed (for pain).  Swallow whole.   CONTINUOUS BLOOD GLUC RECEIVER (FREESTYLE LIBRE READER) DEVI    1 Units by Does not apply route daily. UAD to test BGs daily. Dx E11.9   CONTINUOUS GLUCOSE RECEIVER (FREESTYLE LIBRE 3 READER) DEVI    1 Device by Does not apply route continuous.   CONTINUOUS GLUCOSE SENSOR (FREESTYLE LIBRE 3 PLUS SENSOR) MISC    Check BGs continuously. E11.9 Change sensor every 15 days.   EMPAGLIFLOZIN-METFORMIN HCL ER (SYNJARDY  XR) 11-998 MG TB24    Take 1 tablet by mouth 2 (two) times daily.   GLUCAGON  (BAQSIMI  ONE PACK) 3 MG/DOSE POWD    Place 1 Device into the nose as needed (Low blood sugar with impaired consciousness).   GLUCOSE BLOOD (ACCU-CHEK GUIDE TEST) TEST STRIP    Check BS in the morning and at night Dx E11.9   HYDROXYZINE (ATARAX) 25 MG TABLET    Take 0.5-1 tablets (12.5-25 mg total) by mouth every 8 (eight) hours as needed for anxiety.   INSULIN  GLARGINE (LANTUS ) 100 UNIT/ML SOLOSTAR PEN    Inject 50-60 Units into the skin daily.   INSULIN  PEN NEEDLE (BD PEN NEEDLE NANO 2ND GEN) 32G X 4 MM MISC    UAD to administer insulin  Dx E11.10   LIPASE/PROTEASE/AMYLASE (CREON ) 36000 UNITS CPEP CAPSULE    Take 3 capsules with meals and 1-2  capsule with snacks ( up too 2 snacks )   NALOXONE  (NARCAN ) NASAL SPRAY 4 MG/0.1 ML    Place 1 spray into the nose as directed.   ONDANSETRON  (ZOFRAN ) 4 MG TABLET    Take 4 mg by mouth every 6 (six) hours as needed for nausea or vomiting.   OXYCODONE  HCL 10 MG TABS    Take 1 tablet (10 mg total) by mouth every 6 (six) hours as needed (for pain). To last 3 months   ROSUVASTATIN  (CRESTOR ) 10 MG TABLET    Take 1 tablet (10 mg total) by mouth daily.  Modified Medications   Modified Medication Previous Medication   INSULIN  ASPART (NOVOLOG  FLEXPEN) 100 UNIT/ML FLEXPEN insulin  aspart (NOVOLOG  FLEXPEN) 100 UNIT/ML FlexPen      Inject 12-15 units three times daily before meals + sliding scale, max dose 70 units a day    10 units tidac + sliding scale, max dose 60 units  a day  Discontinued Medications   SEMGLEE , YFGN, 100 UNIT/ML PEN    Inject 40 Units into the skin daily.  Allergies Allergies  Allergen Reactions   Desipramine Nausea Only and Rash    Past Medical History Past Medical History:  Diagnosis Date   Acute pancreatitis without necrosis or infection, unspecified    AKI (acute kidney injury) 05/17/2022   Anxiety 10/2014   Ascites    Bipolar disorder (HCC) age 16   COPD (chronic obstructive pulmonary disease) (HCC) 2016   Depression age 34   DKA (diabetic ketoacidosis) (HCC) 08/11/2023   Enlarged prostate    Hyperlipidemia 2016   Hypertension 2013   Nausea vomiting and diarrhea 06/15/2022   Schizoaffective disorder (HCC) 11/13/2014   Sleep apnea    Does not wear c-pap, sleeps in sitting up position per pt   Thyroid  disease 11/2014    Past Surgical History Past Surgical History:  Procedure Laterality Date   BALLOON DILATION N/A 11/07/2020   Procedure: BALLOON DILATION;  Surgeon: Wilhelmenia Aloha Raddle., MD;  Location[facility] END[facility]COPY;  Service: Gastroenterology;  Laterality: N/A;   BALLOON DILATION N/A 06/26/2021   Procedure: BALLOON DILATION;  Surgeon: Wilhelmenia Aloha Raddle., MD;  Location: [facility] ENDOSCOPY;  Service: Gastroenterology;  Laterality: N/A;   BILIARY BRUSHING  09/23/2023   Procedure: BRUSH BIOPSY, BILE DUCT;  Surgeon: Wilhelmenia Aloha Raddle., MD;  Location: [facility];  Service: Gastroenterology;;   BILIARY DILATION  07/24/2021   Procedure: BILIARY DILATION;  Surgeon: Wilhelmenia Aloha Raddle., MD;  Location: [facility] ENDOSCOPY;  Service: Gastroenterology;;   BILIARY DILATION  05/22/2022   Procedure: BILIARY DILATION;  Surgeon: Wilhelmenia Aloha Raddle., MD;  Location: [facility]COPY;  Service: Gastroenterology;;   BILIARY DILATION  06/16/2022   Procedure: GASTROSTOMY DILATION;  Surgeon: Wilhelmenia Aloha Raddle., MD;  Location: [facility];  Service: Gastroenterology;;   BILIARY STENT PLACEMENT N/A 10/10/2020   Procedure: BILIARY  STENT PLACEMENT;  Surgeon: Golda Claudis PENNER, MD;  Location: [facility];  Service: Endoscopy;  Laterality: N/A;   BILIARY STENT PLACEMENT  11/09/2020   Procedure: BILIARY STENT PLACEMENT;  Surgeon: Wilhelmenia Aloha Raddle., MD;  Location: [facility] ENDOSCOPY;  Service: Gastroenterology;;   BILIARY STENT PLACEMENT  11/07/2020   Procedure: BILIARY STENT PLACEMENT;  Surgeon: Wilhelmenia Aloha Raddle., MD;  Location: [facility] ENDOSCOPY;  Service: Gastroenterology;;   BILIARY STENT PLACEMENT  11/11/2020   Procedure: BILIARY STENT PLACEMENT;  Surgeon: Wilhelmenia Aloha Raddle., MD;  Location: [facility] ENDOSCOPY;  Service: Gastroenterology;;   BILIARY STENT PLACEMENT  11/14/2020   Procedure: BILIARY STENT PLACEMENT;  Surgeon: Wilhelmenia Aloha Raddle., MD;  Location: [facility] ENDOSCOPY;  Service: Gastroenterology;;   BILIARY STENT PLACEMENT N/A 01/29/2021   Procedure: BILIARY STENT PLACEMENT;  Surgeon: Wilhelmenia Aloha Raddle., MD;  Location: [facility];  Service: Gastroenterology;  Laterality: N/A;   BILIARY STENT PLACEMENT  06/26/2021   Procedure: BILIARY STENT PLACEMENT;  Surgeon: Wilhelmenia Aloha Raddle., MD;  Location: [facility] ENDOSCOPY;  Service: Gastroenterology;;   BILIARY STENT PLACEMENT  07/24/2021   Procedure: BILIARY STENT PLACEMENT;  Surgeon: Wilhelmenia Aloha Raddle., MD;  Location: [facility] ENDOSCOPY;  Service: Gastroenterology;;   BILIARY STENT PLACEMENT N/A 09/23/2023   Procedure: INSERTION, STENT, BILE DUCT;  Surgeon: Wilhelmenia Aloha Raddle., MD;  Location: [facility];  Service: Gastroenterology;  Laterality: N/A;   BIOPSY  11/07/2020   Procedure: BIOPSY;  Surgeon: Wilhelmenia Aloha Raddle., MD;  Location: [facility] ENDOSCOPY;  Service: Gastroenterology;;   BIOPSY  12/20/2020   Procedure: BIOPSY;  Surgeon: Wilhelmenia Aloha Raddle., MD;  Location: [facility];  Service: Gastroenterology;;   BIOPSY  06/26/2021   Procedure: BIOPSY;  Surgeon: Wilhelmenia Aloha Raddle., MD;  Location: [facility] ENDOSCOPY;  Service: Gastroenterology;;   ORIN MEDIATE  RELEASE Left 02/21/2016   Procedure: CARPAL TUNNEL RELEASE;  Surgeon: Taft FORBES Minerva, MD;  Location: [facility];  Service: Orthopedics;  Laterality: Left;   CHOLECYSTECTOMY N/A 10/09/2020   Procedure: LAPAROSCOPIC CHOLECYSTECTOMY;  Surgeon: Kallie Manuelita BROCKS, MD;  Location: [facility];  Service: General;  Laterality: N/A;   CYST ENTEROSTOMY N/A 11/07/2020   Procedure: CYST ENTEROSTOMY;  Surgeon: Wilhelmenia Aloha Raddle., MD;  Location: [facility] ENDOSCOPY;  Service: Gastroenterology;  Laterality: N/A;   CYST GASTROSTOMY  11/11/2020   Procedure: CYST NECROSECTOMY;  Surgeon: Wilhelmenia Aloha Raddle., MD;  Location: [facility] ENDOSCOPY;  Service: Gastroenterology;;   CYST GASTROSTOMY  11/14/2020   Procedure: CYST NECROSECTOMY;  Surgeon: Wilhelmenia Aloha Raddle., MD;  Location: [facility] ENDOSCOPY;  Service: Gastroenterology;;   CYST GASTROSTOMY  06/16/2022   Procedure: CYST GASTROSTOMY;  Surgeon: Wilhelmenia Aloha Raddle., MD;  Location: [facility] ENDOSCOPY;  Service: Gastroenterology;;   CYST REMOVAL HAND     CYSTOSCOPY  06/26/2021   Procedure: CYSTOGASTROSTOMY;  Surgeon: Mansouraty, Aloha Raddle., MD;  Location: [facility] ENDOSCOPY;  Service: Gastroenterology;;   ENDOSCOPIC RETROGRADE CHOLANGIOPANCREATOGRAPHY (ERCP) WITH PROPOFOL  N/A 11/09/2020   Procedure: ENDOSCOPIC RETROGRADE CHOLANGIOPANCREATOGRAPHY (ERCP) WITH PROPOFOL ;  Surgeon: Wilhelmenia Aloha Raddle., MD;  Location: [facility] ENDOSCOPY;  Service: Gastroenterology;  Laterality: N/A;   ENDOSCOPIC RETROGRADE CHOLANGIOPANCREATOGRAPHY (ERCP) WITH PROPOFOL  N/A 12/20/2020   Procedure: ENDOSCOPIC RETROGRADE CHOLANGIOPANCREATOGRAPHY (ERCP) WITH PROPOFOL ;  Surgeon: Wilhelmenia Aloha Raddle., MD;  Location: [facility] ENDOSCOPY;  Service: Gastroenterology;  Laterality: N/A;   ENDOSCOPIC RETROGRADE CHOLANGIOPANCREATOGRAPHY (ERCP) WITH PROPOFOL  N/A 01/29/2021   Procedure: ENDOSCOPIC RETROGRADE CHOLANGIOPANCREATOGRAPHY (ERCP) WITH PROPOFOL ;  Surgeon: Wilhelmenia Aloha Raddle., MD;  Location: [facility] ENDOSCOPY;  Service:  Gastroenterology;  Laterality: N/A;   ENDOSCOPIC RETROGRADE CHOLANGIOPANCREATOGRAPHY (ERCP) WITH PROPOFOL  N/A 07/24/2021   Procedure: ENDOSCOPIC RETROGRADE CHOLANGIOPANCREATOGRAPHY (ERCP) WITH PROPOFOL ;  Surgeon: Wilhelmenia Aloha Raddle., MD;  Location: [facility] ENDOSCOPY;  Service: Gastroenterology;  Laterality: N/A;   ENDOSCOPIC RETROGRADE CHOLANGIOPANCREATOGRAPHY (ERCP) WITH PROPOFOL  N/A 09/23/2023   Procedure: ENDOSCOPIC RETROGRADE CHOLANGIOPANCREATOGRAPHY (ERCP) WITH PROPOFOL ;  Surgeon: Wilhelmenia Aloha Raddle., MD;  Location: [facility] ENDOSCOPY;  Service: Gastroenterology;  Laterality: N/A;   ERCP N/A 10/10/2020   Procedure: ENDOSCOPIC RETROGRADE CHOLANGIOPANCREATOGRAPHY (ERCP);  Surgeon: Golda Claudis PENNER, MD;  Location: [facility];  Service: Endoscopy;  Laterality: N/A;   ERCP     ERCP N/A 05/22/2022   Procedure: ENDOSCOPIC RETROGRADE CHOLANGIOPANCREATOGRAPHY (ERCP);  Surgeon: Wilhelmenia Aloha Raddle., MD;  Location: [facility];  Service: Gastroenterology;  Laterality: N/A;   ESOPHAGOGASTRODUODENOSCOPY N/A 11/11/2020   Procedure: ESOPHAGOGASTRODUODENOSCOPY (EGD);  Surgeon: Wilhelmenia Aloha Raddle., MD;  Location: [facility] ENDOSCOPY;  Service: Gastroenterology;  Laterality: N/A;   ESOPHAGOGASTRODUODENOSCOPY N/A 11/14/2020   Procedure: ESOPHAGOGASTRODUODENOSCOPY (EGD);  Surgeon: Wilhelmenia Aloha Raddle., MD;  Location: [facility] ENDOSCOPY;  Service: Gastroenterology;  Laterality: N/A;   ESOPHAGOGASTRODUODENOSCOPY (EGD) WITH PROPOFOL  N/A 11/09/2020   Procedure: ESOPHAGOGASTRODUODENOSCOPY (EGD) WITH PROPOFOL ;  Surgeon: Wilhelmenia Aloha Raddle., MD;  Location: [facility] ENDOSCOPY;  Service: Gastroenterology;  Laterality: N/A;   ESOPHAGOGASTRODUODENOSCOPY (EGD) WITH PROPOFOL  N/A 11/07/2020   Procedure: ESOPHAGOGASTRODUODENOSCOPY (EGD) WITH PROPOFOL ;  Surgeon: Wilhelmenia Aloha Raddle., MD;  Location: [facility] ENDOSCOPY;  Service: Gastroenterology;  Laterality: N/A;   ESOPHAGOGASTRODUODENOSCOPY (EGD) WITH PROPOFOL  N/A 12/20/2020   Procedure:  ESOPHAGOGASTRODUODENOSCOPY (EGD) WITH PROPOFOL ;  Surgeon: Wilhelmenia Aloha Raddle., MD;  Location: [facility] END[facility];  Service: Gastroenterology;  Laterality: N/A;   ESOPHAGOGASTRODUODENOSCOPY (EGD) WITH PROPOFOL  N/A 06/26/2021   Procedure: ESOPHAGOGASTRODUODENOSCOPY (EGD) WITH PROPOFOL ;  Surgeon: Wilhelmenia Aloha Raddle., MD;  Location: [facility] ENDOSCOPY;  Service: Gastroenterology;  Laterality: N/A;  ESOPHAGOGASTRODUODENOSCOPY (EGD) WITH PROPOFOL  N/A 07/24/2021   Procedure: ESOPHAGOGASTRODUODENOSCOPY (EGD) WITH PROPOFOL ;  Surgeon: Wilhelmenia Aloha Raddle., MD;  Location: [facility] ENDOSCOPY;  Service: Gastroenterology;  Laterality: N/A;  AXIOS STENT   ESOPHAGOGASTRODUODENOSCOPY (EGD) WITH PROPOFOL  N/A 05/22/2022   Procedure: ESOPHAGOGASTRODUODENOSCOPY (EGD) WITH PROPOFOL ;  Surgeon: Wilhelmenia Aloha Raddle., MD;  Location: [facility] ENDOSCOPY;  Service: Gastroenterology;  Laterality: N/A;   ESOPHAGOGASTRODUODENOSCOPY (EGD) WITH PROPOFOL  N/A 06/16/2022   Procedure: ESOPHAGOGASTRODUODENOSCOPY (EGD) WITH PROPOFOL ;  Surgeon: Wilhelmenia Aloha Raddle., MD;  Location: [facility]OS[facility];  Service: Gastroenterology;  Laterality: N/A;   ESOPHAGOGASTRODUODENOSCOPY (EGD) WITH PROPOFOL  N/A 07/30/2022   Procedure: ESOPHAGOGASTRODUODENOSCOPY (EGD) WITH PROPOFOL ;  Surgeon: Wilhelmenia Aloha Raddle., MD;  Location: [facility] ENDOS[facility];  Service: Gastroenterology;  Laterality: N/A;   ESOPHAGOGASTRODUODENOSCOPY (EGD) WITH PROPOFOL  N/A 03/30/2023   Procedure: ESOPHAGOGASTRODUODENOSCOPY (EGD) WITH PROPOFOL ;  Surgeon: Avram Lupita BRAVO, MD;  Location: [facility] ENDOS[facility];  Service: Gastroenterology;  Laterality: N/A;   EUS  11/14/2020   Procedure: UPPER ENDOSCOPIC ULTRASOUND (EUS) LINEAR;  Surgeon: Wilhelmenia Aloha Raddle., MD;  Location: [facility] ENDOSCOPY;  Service: Gastroenterology;;   EUS N/A 06/26/2021   Procedure: UPPER ENDOSCOPIC ULTRASOUND (EUS) RADIAL;  Surgeon: Wilhelmenia Aloha Raddle., MD;  Location: [facility] ENDOSCOPY;  Service: Gastroenterology;  Laterality: N/A;   EUS  N/A 05/22/2022   Procedure: UPPER ENDOSCOPIC ULTRASOUND (EUS) LINEAR;  Surgeon: Wilhelmenia Aloha Raddle., MD;  Location: [facility]OS[facility];  Service: Gastroenterology;  Laterality: N/A;   EUS N/A 06/16/2022   Procedure: UPPER ENDOSCOPIC ULTRASOUND (EUS) LINEAR;  Surgeon: Wilhelmenia Aloha Raddle., MD;  Location: [facility] ENDOSCOPY;  Service: Gastroenterology;  Laterality: N/A;   FINE NEEDLE ASPIRATION  05/22/2022   Procedure: FINE NEEDLE ASPIRATION;  Surgeon: Wilhelmenia Aloha Raddle., MD;  Location: [facility];  Service: Gastroenterology;;   GASTROINTESTINAL STENT REMOVAL  12/20/2020   Procedure: GASTROINTESTINAL STENT REMOVAL;  Surgeon: Wilhelmenia Aloha Raddle., MD;  Location: [facility] ENDOSCOPY;  Service: Gastroenterology;;  cyst gastrostomy stent and double pig tail stents x2 removed   HERNIA REPAIR Left 2002   groin   INGUINAL HERNIA REPAIR Left 04/05/2017   Procedure: RECURRENT HERNIA REPAIR INGUINAL ADULT WITH MESH;  Surgeon: Mavis Anes, MD;  Location: [facility];  Service: General;  Laterality: Left;   MASS EXCISION Right 04/29/2020   Procedure: EXCISION MASS RIGHT WRIST;  Surgeon: Murrell Drivers, MD;  Location: [facility];  Service: Orthopedics;  Laterality: Right;   MASS EXCISION Right 10/09/2020   Procedure: EXCISION MASS, ABDOMINAL WALL, 2CM;  Surgeon: Kallie Manuelita BROCKS, MD;  Location: [facility];  Service: General;  Laterality: Right;   PANCREATIC STENT PLACEMENT  06/26/2021   Procedure: AXIOS STENT PLACEMENT;  Surgeon: Wilhelmenia Aloha Raddle., MD;  Location: [facility] ENDOSCOPY;  Service: Gastroenterology;;   PANCREATIC STENT PLACEMENT  06/16/2022   Procedure: PANCREATIC STENT PLACEMENT;  Surgeon: Wilhelmenia Aloha Raddle., MD;  Location: [facility];  Service: Gastroenterology;;   REMOVAL OF STONES  12/20/2020   Procedure: REMOVAL OF STONES;  Surgeon: Wilhelmenia Aloha Raddle., MD;  Location: [facility];  Service: Gastroenterology;;   REMOVAL OF STONES  07/24/2021   Procedure: REMOVAL OF STONES;   Surgeon: Wilhelmenia Aloha Raddle., MD;  Location: [facility] ENDOSCOPY;  Service: Gastroenterology;;   REMOVAL OF STONES  05/22/2022   Procedure: REMOVAL OF STONES;  Surgeon: Wilhelmenia Aloha Raddle., MD;  Location: [facility];  Service: Gastroenterology;;   ANNETT N/A 10/10/2020   Procedure: ANNETT;  Surgeon: Golda Claudis PENNER, MD;  Location: [facility];  Service: Endoscopy;  Laterality: N/A;   SPHINCTEROTOMY  07/24/2021   Procedure: SPHINCTEROTOMY;  Surgeon: Wilhelmenia Aloha Raddle., MD;  Location: [facility] ENDOSCOPY;  Service: Gastroenterology;;   SPLENECTOMY, TOTAL N/A 11/17/2022   Procedure: SPLENECTOMY;  Surgeon: Dasie Leonor CROME, MD;  Location: [facility] OR;  Service: General;  Laterality: N/A;   SPYGLASS CHOLANGIOSCOPY N/A 12/20/2020   Procedure: DEBHOJDD CHOLANGIOSCOPY;  Surgeon: Wilhelmenia Aloha Raddle., MD;  Location: [facility] ENDOSCOPY;  Service: Gastroenterology;  Laterality: N/A;   SPYGLASS CHOLANGIOSCOPY N/A 07/24/2021   Procedure: SPYGLASS CHOLANGIOSCOPY;  Surgeon: Wilhelmenia Aloha Raddle., MD;  Location: [facility] ENDOSCOPY;  Service: Gastroenterology;  Laterality: N/A;   STENT REMOVAL  11/09/2020   Procedure: STENT REMOVAL;  Surgeon: Wilhelmenia Aloha Raddle., MD;  Location: [facility] ENDOSCOPY;  Service: Gastroenterology;;   CLEDA REMOVAL  11/11/2020   Procedure: STENT REMOVAL;  Surgeon: Wilhelmenia Aloha Raddle., MD;  Location: [facility] ENDOSCOPY;  Service: Gastroenterology;;   CLEDA REMOVAL  11/14/2020   Procedure: STENT REMOVAL;  Surgeon: Wilhelmenia Aloha Raddle., MD;  Location: [facility] ENDOSCOPY;  Service: Gastroenterology;;   CLEDA REMOVAL  12/20/2020   Procedure: STENT REMOVAL;  Surgeon: Wilhelmenia Aloha Raddle., MD;  Location: [facility];  Service: Gastroenterology;;  biliary x2   STENT REMOVAL  07/24/2021   Procedure: AXIOS STENT REMOVAL;  Surgeon: Wilhelmenia Aloha Raddle., MD;  Location: [facility] ENDOSCOPY;  Service: Gastroenterology;;   CLEDA REMOVAL  07/30/2022   Procedure: STENT REMOVAL;  Surgeon: Wilhelmenia Aloha Raddle., MD;  Location: [facility];  Service: Gastroenterology;;   UPPER ESOPHAGEAL ENDOSCOPIC ULTRASOUND (EUS) N/A 11/07/2020   Procedure: UPPER ESOPHAGEAL ENDOSCOPIC ULTRASOUND (EUS);  Surgeon: Wilhelmenia Aloha Raddle., MD;  Location: [facility] ENDOSCOPY;  Service: Gastroenterology;  Laterality: N/A;   UPPER GASTROINTESTINAL ENDOSCOPY     WOUND DEBRIDEMENT  11/09/2020   Procedure: CYST NECROSECTOMY;  Surgeon: Wilhelmenia Aloha Raddle., MD;  Location: [facility] ENDOSCOPY;  Service: Gastroenterology;;    Family History family history includes Diabetes in his father; Heart disease in his father and maternal grandmother; Stroke in his maternal grandfather.  Social History Social History   Socioeconomic History   Marital status: Single    Spouse name: Not on file   Number of children: 0   Years of education: Not on file   Highest education level: GED or equivalent  Occupational History   Not on file  Tobacco Use   Smoking status: Every Day    Current packs/day: 0.25    Average packs/day: 0.3 packs/day for 26.0 years (6.5 ttl pk-yrs)    Types: Cigarettes   Smokeless tobacco: Never   Tobacco comments:    4-5 cig daily as of 10/07/2020.  Vaping Use   Vaping status: Never Used  Substance and Sexual Activity   Alcohol use: No   Drug use: No   Sexual activity: Never    Birth control/protection: None  Other Topics Concern   Not on file  Social History Narrative   Not on file   Social Drivers of Health   Financial Resource Strain: Low Risk  (07/27/2023)   Overall Financial Resource Strain (CARDIA)    Difficulty of Paying Living Expenses: Not very hard  Food Insecurity: No Food Insecurity (08/16/2023)   Hunger Vital Sign    Worried About Running Out of Food in the Last Year: Never true    Ran Out of Food in the Last Year: Never true  Recent Concern: Food Insecurity - Food Insecurity Present (07/27/2023)   Hunger Vital Sign    Worried About Running Out of Food in the Last Year: Never true    Ran Out  of Food in the Last  Year: Sometimes true  Transportation Needs: No Transportation Needs (08/16/2023)   PRAPARE - Administrator, Civil Service (Medical): No    Lack of Transportation (Non-Medical): No  Physical Activity: Insufficiently Active (07/27/2023)   Exercise Vital Sign    Days of Exercise per Week: 2 days    Minutes of Exercise per Session: 10 min  Stress: No Stress Concern Present (07/27/2023)   [employer] of Occupational Health - Occupational Stress Questionnaire    Feeling of Stress : Only a little  Social Connections: Unknown (07/27/2023)   Social Connection and Isolation Panel    Frequency of Communication with Friends and Family: Three times a week    Frequency of Social Gatherings with Friends and Family: Once a week    Attends Religious Services: Patient declined    Active Member of Clubs or Organizations: No    Attends Banker Meetings: Not on file    Marital Status: Divorced  Intimate Partner Violence: Not At Risk (08/16/2023)   Humiliation, Afraid, Rape, and Kick questionnaire    Fear of Current or Ex-Partner: No    Emotionally Abused: No    Physically Abused: No    Sexually Abused: No    Lab Results  Component Value Date   HGBA1C 10.5 (A) 05/09/2024   HGBA1C 11.3 (A) 02/01/2024   HGBA1C 10.5 (A) 11/16/2023   Lab Results  Component Value Date   CHOL 158 09/14/2023   Lab Results  Component Value Date   HDL 44 09/14/2023   Lab Results  Component Value Date   LDLCALC 98 09/14/2023   Lab Results  Component Value Date   TRIG 72 09/14/2023   Lab Results  Component Value Date   CHOLHDL 3.6 09/14/2023   Lab Results  Component Value Date   CREATININE 0.86 12/02/2023   Lab Results  Component Value Date   GFR 99.32 12/02/2023   Lab Results  Component Value Date   MICROALBUR 0.5 09/14/2023      Component Value Date/Time   NA 139 12/02/2023 0811   NA 135 08/18/2023 1205   K 4.4 12/02/2023 0811   CL 102 12/02/2023  0811   CO2 29 12/02/2023 0811   GLUCOSE 188 (H) 12/02/2023 0811   BUN 15 12/02/2023 0811   BUN 15 08/18/2023 1205   CREATININE 0.86 12/02/2023 0811   CREATININE 0.79 09/14/2023 0940   CALCIUM  9.2 12/02/2023 0811   PROT 6.7 12/02/2023 0811   PROT 5.7 (L) 08/18/2023 1205   ALBUMIN  4.2 12/02/2023 0811   ALBUMIN  3.7 (L) 08/18/2023 1205   AST 19 12/02/2023 0811   ALT 29 12/02/2023 0811   ALKPHOS 130 (H) 12/02/2023 0811   BILITOT 0.4 12/02/2023 0811   BILITOT <0.2 08/18/2023 1205   GFRNONAA >60 08/13/2023 0450   GFRNONAA 89 04/22/2015 0828   GFRAA 85 10/04/2019 1433   GFRAA >89 04/22/2015 0828      Latest Ref Rng & Units 12/02/2023    8:11 AM 09/14/2023    9:40 AM 08/18/2023   12:05 PM  BMP  Glucose 70 - 99 mg/dL 811  697  790   BUN 6 - 23 mg/dL 15  16  15    Creatinine 0.40 - 1.50 mg/dL 9.13  9.20  9.24   BUN/Creat Ratio 6 - 22 (calc)  SEE NOTE:  20   Sodium 135 - 145 mEq/L 139  136  135   Potassium 3.5 - 5.1 mEq/L 4.4  4.8  4.7   Chloride  96 - 112 mEq/L 102  98  95   CO2 19 - 32 mEq/L 29  32  26   Calcium  8.4 - 10.5 mg/dL 9.2  9.7  9.4        Component Value Date/Time   WBC 14.1 (H) 12/02/2023 0811   RBC 5.12 12/02/2023 0811   HGB 15.9 12/02/2023 0811   HGB 13.8 08/18/2023 1205   HCT 46.8 12/02/2023 0811   HCT 41.0 08/18/2023 1205   PLT 429.0 (H) 12/02/2023 0811   PLT 484 (H) 08/18/2023 1205   MCV 91.4 12/02/2023 0811   MCV 94 08/18/2023 1205   MCH 31.7 08/18/2023 1205   MCH 31.7 08/12/2023 0257   MCHC 34.1 12/02/2023 0811   RDW 13.9 12/02/2023 0811   RDW 12.4 08/18/2023 1205   LYMPHSABS 3.7 (H) 08/18/2023 1205   MONOABS 0.7 07/07/2023 1310   EOSABS 0.1 08/18/2023 1205   BASOSABS 0.1 08/18/2023 1205     Parts of this note may have been dictated using voice recognition software. There may be variances in spelling and vocabulary which are unintentional. Not all errors are proofread. Please notify the dino if any discrepancies are noted or if the meaning of any  statement is not clear.

## 2024-05-09 NOTE — Patient Instructions (Signed)

## 2024-05-09 NOTE — Telephone Encounter (Signed)
 Patient was seen today and needs his RX insulin  glargine (LANTUS ) 100 UNIT/ML Solostar Pen sent to the [facility] in [place].Please advise

## 2024-05-15 ENCOUNTER — Telehealth: Payer: Self-pay

## 2024-05-15 ENCOUNTER — Other Ambulatory Visit: Payer: Self-pay

## 2024-05-15 DIAGNOSIS — E1165 Type 2 diabetes mellitus with hyperglycemia: Secondary | ICD-10-CM

## 2024-05-15 MED ORDER — INSULIN GLARGINE 100 UNIT/ML SOLOSTAR PEN: 45.0000 [IU] | PEN_INJECTOR | Freq: Every day | SUBCUTANEOUS | 3 refills | Status: AC

## 2024-05-15 MED ORDER — NOVOLOG FLEXPEN 100 UNIT/ML ~~LOC~~ SOPN: PEN_INJECTOR | SUBCUTANEOUS | 3 refills | Status: AC

## 2024-05-15 NOTE — Telephone Encounter (Signed)
 Pt call and request his insulin  to be transferred from [facility] [address] to the [facility] in [place]. Rx sent to the [facility].

## 2024-05-17 ENCOUNTER — Other Ambulatory Visit: Payer: Self-pay

## 2024-05-18 ENCOUNTER — Other Ambulatory Visit: Payer: Self-pay

## 2024-06-05 ENCOUNTER — Telehealth: Payer: Self-pay | Admitting: Gastroenterology

## 2024-06-05 ENCOUNTER — Encounter (HOSPITAL_COMMUNITY): Payer: Self-pay | Admitting: Gastroenterology

## 2024-06-05 NOTE — Telephone Encounter (Addendum)
 Procedure:ERCP Procedure date: 06/12/24 Procedure location: [facility] Arrival Time: 10:15 am Spoke with the patient Y/N:   No, I left a detailed message on [phone] on 06/05/24 @ 10:26 am for the patient to return call  No, I left a detailed message on [phone] on 06/06/24 @ 1:45 pm for the patient to return call, Mychart message sent.  Any prep concerns? No  Has the patient obtained the prep from the pharmacy ? No prep needed Do you have a care partner and transportation: ___ Any additional concerns? ___

## 2024-06-06 NOTE — Telephone Encounter (Signed)
 Noted

## 2024-06-09 ENCOUNTER — Ambulatory Visit: Payer: MEDICAID | Admitting: "Endocrinology

## 2024-06-12 ENCOUNTER — Ambulatory Visit (HOSPITAL_COMMUNITY)
Admission: RE | Admit: 2024-06-12 | Discharge: 2024-06-12 | Disposition: A | Discharge status: Home / Self Care | Payer: MEDICAID | Attending: Gastroenterology | Admitting: Gastroenterology

## 2024-06-12 ENCOUNTER — Encounter (HOSPITAL_COMMUNITY)
Admission: RE | Disposition: A | Discharge status: Home / Self Care | Payer: Self-pay | Source: Home / Self Care | Attending: Gastroenterology

## 2024-06-12 ENCOUNTER — Encounter (HOSPITAL_COMMUNITY): Payer: Self-pay | Admitting: Gastroenterology

## 2024-06-12 ENCOUNTER — Ambulatory Visit (HOSPITAL_COMMUNITY): Payer: MEDICAID

## 2024-06-12 ENCOUNTER — Encounter (HOSPITAL_COMMUNITY): Payer: MEDICAID | Admitting: Registered Nurse

## 2024-06-12 ENCOUNTER — Other Ambulatory Visit: Payer: Self-pay

## 2024-06-12 ENCOUNTER — Ambulatory Visit (HOSPITAL_COMMUNITY): Payer: MEDICAID | Admitting: Registered Nurse

## 2024-06-12 ENCOUNTER — Telehealth: Payer: Self-pay

## 2024-06-12 DIAGNOSIS — K863 Pseudocyst of pancreas: Secondary | ICD-10-CM

## 2024-06-12 DIAGNOSIS — K838 Other specified diseases of biliary tract: Secondary | ICD-10-CM

## 2024-06-12 DIAGNOSIS — K859 Acute pancreatitis without necrosis or infection, unspecified: Secondary | ICD-10-CM

## 2024-06-12 DIAGNOSIS — K831 Obstruction of bile duct: Secondary | ICD-10-CM

## 2024-06-12 DIAGNOSIS — Z9889 Other specified postprocedural states: Secondary | ICD-10-CM

## 2024-06-12 DIAGNOSIS — R634 Abnormal weight loss: Secondary | ICD-10-CM

## 2024-06-12 DIAGNOSIS — G8929 Other chronic pain: Secondary | ICD-10-CM

## 2024-06-12 HISTORY — PX: ERCP: SHX5425

## 2024-06-12 HISTORY — PX: BILIARY BRUSHING: SHX6843

## 2024-06-12 HISTORY — PX: BILIARY DILATION: SHX6850

## 2024-06-12 HISTORY — PX: STENT REMOVAL: SHX6421

## 2024-06-12 LAB — GLUCOSE, CAPILLARY: Glucose-Capillary: 157 mg/dL — ABNORMAL HIGH (ref 70–99)

## 2024-06-12 SURGERY — ERCP, WITH INTERVENTION IF INDICATED
Anesthesia: General

## 2024-06-12 MED ORDER — SODIUM CHLORIDE 0.9 % IV SOLN
INTRAVENOUS | Status: DC | PRN
  Administered 2024-06-12: 60 mL

## 2024-06-12 MED ORDER — LIDOCAINE 2% (20 MG/ML) 5 ML SYRINGE
INTRAMUSCULAR | Status: DC | PRN
  Administered 2024-06-12: 100 mg via INTRAVENOUS

## 2024-06-12 MED ORDER — CIPROFLOXACIN IN D5W 400 MG/200ML IV SOLN
INTRAVENOUS | Status: DC | PRN
  Administered 2024-06-12: 400 mg via INTRAVENOUS

## 2024-06-12 MED ORDER — FENTANYL CITRATE (PF) 100 MCG/2ML IJ SOLN
INTRAMUSCULAR | Status: AC
  Filled 2024-06-12: qty 2

## 2024-06-12 MED ORDER — DICLOFENAC SUPPOSITORY 100 MG
RECTAL | Status: DC | PRN
  Administered 2024-06-12: 100 mg via RECTAL

## 2024-06-12 MED ORDER — GLUCAGON HCL RDNA (DIAGNOSTIC) 1 MG IJ SOLR
INTRAMUSCULAR | Status: AC
  Filled 2024-06-12: qty 1

## 2024-06-12 MED ORDER — CIPROFLOXACIN HCL 500 MG PO TABS: 500.0000 mg | ORAL_TABLET | Freq: Two times a day (BID) | ORAL | 0 refills | Status: AC

## 2024-06-12 MED ORDER — SUGAMMADEX SODIUM 200 MG/2ML IV SOLN
INTRAVENOUS | Status: DC | PRN
  Administered 2024-06-12: 200 mg via INTRAVENOUS

## 2024-06-12 MED ORDER — MIDAZOLAM HCL 2 MG/2ML IJ SOLN
INTRAMUSCULAR | Status: AC
  Filled 2024-06-12: qty 2

## 2024-06-12 MED ORDER — DICLOFENAC SUPPOSITORY 100 MG
RECTAL | Status: AC
  Filled 2024-06-12: qty 1

## 2024-06-12 MED ORDER — DEXAMETHASONE SOD PHOSPHATE PF 10 MG/ML IJ SOLN
INTRAMUSCULAR | Status: DC | PRN
  Administered 2024-06-12: 10 mg via INTRAVENOUS

## 2024-06-12 MED ORDER — CIPROFLOXACIN IN D5W 400 MG/200ML IV SOLN
INTRAVENOUS | Status: AC
  Filled 2024-06-12: qty 200

## 2024-06-12 MED ORDER — FENTANYL CITRATE (PF) 100 MCG/2ML IJ SOLN
INTRAMUSCULAR | Status: DC | PRN
  Administered 2024-06-12 (×2): 25 ug via INTRAVENOUS

## 2024-06-12 MED ORDER — ROCURONIUM BROMIDE 10 MG/ML (PF) SYRINGE
PREFILLED_SYRINGE | INTRAVENOUS | Status: DC | PRN
  Administered 2024-06-12: 50 mg via INTRAVENOUS

## 2024-06-12 MED ORDER — FENTANYL CITRATE (PF) 100 MCG/2ML IJ SOLN
25.0000 ug | Freq: Once | INTRAMUSCULAR | Status: AC
  Administered 2024-06-12: 25 ug via INTRAVENOUS

## 2024-06-12 MED ORDER — ONDANSETRON HCL 4 MG/2ML IJ SOLN
INTRAMUSCULAR | Status: DC | PRN
  Administered 2024-06-12: 4 mg via INTRAVENOUS

## 2024-06-12 MED ORDER — SODIUM CHLORIDE 0.9 % IV SOLN: INTRAVENOUS | Status: DC

## 2024-06-12 MED ORDER — MIDAZOLAM HCL 5 MG/5ML IJ SOLN
INTRAMUSCULAR | Status: DC | PRN
  Administered 2024-06-12: 2 mg via INTRAVENOUS

## 2024-06-12 MED ORDER — PROPOFOL 10 MG/ML IV BOLUS
INTRAVENOUS | Status: DC | PRN
  Administered 2024-06-12: 140 mg via INTRAVENOUS

## 2024-06-12 MED ORDER — LACTATED RINGERS IV SOLN
INTRAVENOUS | Status: AC | PRN
  Administered 2024-06-12: 1000 mL via INTRAVENOUS

## 2024-06-12 NOTE — Discharge Instructions (Signed)

## 2024-06-12 NOTE — H&P (Signed)
 GASTROENTEROLOGY PROCEDURE H&P NOTE   Primary Care Physician: Jolinda Norene HERO, DO  HPI: Steve Romero is a 53 y.o. male  Past Medical History:  Diagnosis Date   Acute pancreatitis without necrosis or infection, unspecified    AKI (acute kidney injury) 05/17/2022   Anxiety 10/2014   Ascites    Bipolar disorder (HCC) age 84   COPD (chronic obstructive pulmonary disease) (HCC) 2016   Depression age 63   DKA (diabetic ketoacidosis) (HCC) 08/11/2023   Enlarged prostate    Hyperlipidemia 2016   Hypertension 2013   Nausea vomiting and diarrhea 06/15/2022   Schizoaffective disorder (HCC) 11/13/2014   Sleep apnea    Does not wear c-pap, sleeps in sitting up position per pt   Thyroid  disease 11/2014   Past Surgical History:  Procedure Laterality Date   BALLOON DILATION N/A 11/07/2020   Procedure: BALLOON DILATION;  Surgeon: Wilhelmenia Aloha Raddle., MD;  Location: [facility] ENDOSCOPY;  Service: Gastroenterology;  Laterality: N/A;   BALLOON DILATION N/A 06/26/2021   Procedure: BALLOON DILATION;  Surgeon: Wilhelmenia Aloha Raddle., MD;  Location: [facility] ENDOSCOPY;  Service: Gastroenterology;  Laterality: N/A;   BILIARY BRUSHING  09/23/2023   Procedure: BRUSH BIOPSY, BILE DUCT;  Surgeon: Wilhelmenia Aloha Raddle., MD;  Location: [facility]OSCOPY;  Service: Gastroenterology;;   BILIARY DILATION  07/24/2021   Procedure: BILIARY DILATION;  Surgeon: Wilhelmenia Aloha Raddle., MD;  Location: [facility] ENDOSCOPY;  Service: Gastroenterology;;   BILIARY DILATION  05/22/2022   Procedure: BILIARY DILATION;  Surgeon: Wilhelmenia Aloha Raddle., MD;  Location: [facility] ENDOSCOPY;  Service: Gastroenterology;;   BILIARY DILATION  06/16/2022   Procedure: GASTROSTOMY DILATION;  Surgeon: Wilhelmenia Aloha Raddle., MD;  Location: [facility]OSCOPY;  Service: Gastroenterology;;   BILIARY STENT PLACEMENT N/A 10/10/2020   Procedure: BILIARY STENT PLACEMENT;  Surgeon: Golda Claudis PENNER, MD;  Location: [facility] ORS;  Service: Endoscopy;  Laterality:  N/A;   BILIARY STENT PLACEMENT  11/09/2020   Procedure: BILIARY STENT PLACEMENT;  Surgeon: Wilhelmenia Aloha Raddle., MD;  Location: [facility] ENDOSCOPY;  Service: Gastroenterology;;   BILIARY STENT PLACEMENT  11/07/2020   Procedure: BILIARY STENT PLACEMENT;  Surgeon: Wilhelmenia Aloha Raddle., MD;  Location: [facility] ENDOSCOPY;  Service: Gastroenterology;;   BILIARY STENT PLACEMENT  11/11/2020   Procedure: BILIARY STENT PLACEMENT;  Surgeon: Wilhelmenia Aloha Raddle., MD;  Location: [facility] ENDOSCOPY;  Service: Gastroenterology;;   BILIARY STENT PLACEMENT  11/14/2020   Procedure: BILIARY STENT PLACEMENT;  Surgeon: Wilhelmenia Aloha Raddle., MD;  Location: [facility] ENDOSCOPY;  Service: Gastroenterology;;   BILIARY STENT PLACEMENT N/A 01/29/2021   Procedure: BILIARY STENT PLACEMENT;  Surgeon: Wilhelmenia Aloha Raddle., MD;  Location: [facility]OS[facility];  Service: Gastroenterology;  Laterality: N/A;   BILIARY STENT PLACEMENT  06/26/2021   Procedure: BILIARY STENT PLACEMENT;  Surgeon: Wilhelmenia Aloha Raddle., MD;  Location: [facility] ENDOSCOPY;  Service: Gastroenterology;;   BILIARY STENT PLACEMENT  07/24/2021   Procedure: BILIARY STENT PLACEMENT;  Surgeon: Wilhelmenia Aloha Raddle., MD;  Location: [facility] ENDOSCOPY;  Service: Gastroenterology;;   BILIARY STENT PLACEMENT N/A 09/23/2023   Procedure: INSERTION, STENT, BILE DUCT;  Surgeon: Wilhelmenia Aloha Raddle., MD;  Location: [facility]OSCOPY;  Service: Gastroenterology;  Laterality: N/A;   BIOPSY  11/07/2020   Procedure: BIOPSY;  Surgeon: Wilhelmenia Aloha Raddle., MD;  Location: [facility] ENDOSCOPY;  Service: Gastroenterology;;   BIOPSY  12/20/2020   Procedure: BIOPSY;  Surgeon: Wilhelmenia Aloha Raddle., MD;  Location: [facility];  Service: Gastroenterology;;   BIOPSY  06/26/2021   Procedure: BIOPSY;  Surgeon: Wilhelmenia Aloha Raddle., MD;  Location:  [facility] ENDOSCOPY;  Service: Gastroenterology;;   CARPAL TUNNEL RELEASE Left 02/21/2016   Procedure: CARPAL TUNNEL RELEASE;  Surgeon: Taft FORBES Minerva, MD;  Location:  [facility];  Service: Orthopedics;  Laterality: Left;   CHOLECYSTECTOMY N/A 10/09/2020   Procedure: LAPAROSCOPIC CHOLECYSTECTOMY;  Surgeon: Kallie Manuelita BROCKS, MD;  Location: [facility];  Service: General;  Laterality: N/A;   CYST ENTEROSTOMY N/A 11/07/2020   Procedure: CYST ENTEROSTOMY;  Surgeon: Wilhelmenia Aloha Raddle., MD;  Location: [facility] ENDOSCOPY;  Service: Gastroenterology;  Laterality: N/A;   CYST GASTROSTOMY  11/11/2020   Procedure: CYST NECROSECTOMY;  Surgeon: Wilhelmenia Aloha Raddle., MD;  Location: [place] ENDOSCOPY;  Service: Gastroenterology;;   CYST GASTROSTOMY  11/14/2020   Procedure: CYST NECROSECTOMY;  Surgeon: Wilhelmenia Aloha Raddle., MD;  Location: [facility] ENDOSCOPY;  Service: Gastroenterology;;   CYST GASTROSTOMY  06/16/2022   Procedure: CYST GASTROSTOMY;  Surgeon: Wilhelmenia Aloha Raddle., MD;  Location: [facility] ENDOSCOPY;  Service: Gastroenterology;;   CYST REMOVAL HAND     CYSTOSCOPY  06/26/2021   Procedure: CYSTOGASTROSTOMY;  Surgeon: Mansouraty, Aloha Raddle., MD;  Location: [facility] ENDOSCOPY;  Service: Gastroenterology;;   ENDOSCOPIC RETROGRADE CHOLANGIOPANCREATOGRAPHY (ERCP) WITH PROPOFOL  N/A 11/09/2020   Procedure: ENDOSCOPIC RETROGRADE CHOLANGIOPANCREATOGRAPHY (ERCP) WITH PROPOFOL ;  Surgeon: Wilhelmenia Aloha Raddle., MD;  Location: [facility] ENDOSCOPY;  Service: Gastroenterology;  Laterality: N/A;   ENDOSCOPIC RETROGRADE CHOLANGIOPANCREATOGRAPHY (ERCP) WITH PROPOFOL  N/A 12/20/2020   Procedure: ENDOSCOPIC RETROGRADE CHOLANGIOPANCREATOGRAPHY (ERCP) WITH PROPOFOL ;  Surgeon: Wilhelmenia Aloha Raddle., MD;  Location: [facility] ENDOSCOPY;  Service: Gastroenterology;  Laterality: N/A;   ENDOSCOPIC RETROGRADE CHOLANGIOPANCREATOGRAPHY (ERCP) WITH PROPOFOL  N/A 01/29/2021   Procedure: ENDOSCOPIC RETROGRADE CHOLANGIOPANCREATOGRAPHY (ERCP) WITH PROPOFOL ;  Surgeon: Wilhelmenia Aloha Raddle., MD;  Location: [facility] ENDOSCOPY;  Service: Gastroenterology;  Laterality: N/A;   ENDOSCOPIC RETROGRADE CHOLANGIOPANCREATOGRAPHY (ERCP) WITH PROPOFOL   N/A 07/24/2021   Procedure: ENDOSCOPIC RETROGRADE CHOLANGIOPANCREATOGRAPHY (ERCP) WITH PROPOFOL ;  Surgeon: Wilhelmenia Aloha Raddle., MD;  Location: [facility] ENDOSCOPY;  Service: Gastroenterology;  Laterality: N/A;   ENDOSCOPIC RETROGRADE CHOLANGIOPANCREATOGRAPHY (ERCP) WITH PROPOFOL  N/A 09/23/2023   Procedure: ENDOSCOPIC RETROGRADE CHOLANGIOPANCREATOGRAPHY (ERCP) WITH PROPOFOL ;  Surgeon: Wilhelmenia Aloha Raddle., MD;  Location: [facility] ENDOSCOPY;  Service: Gastroenterology;  Laterality: N/A;   ERCP N/A 10/10/2020   Procedure: ENDOSCOPIC RETROGRADE CHOLANGIOPANCREATOGRAPHY (ERCP);  Surgeon: Golda Claudis PENNER, MD;  Location: [facility];  Service: Endoscopy;  Laterality: N/A;   ERCP     ERCP N/A 05/22/2022   Procedure: ENDOSCOPIC RETROGRADE CHOLANGIOPANCREATOGRAPHY (ERCP);  Surgeon: Wilhelmenia Aloha Raddle., MD;  Location: [facility]COPY;  Service: Gastroenterology;  Laterality: N/A;   ESOPHAGOGASTRODUODENOSCOPY N/A 11/11/2020   Procedure: ESOPHAGOGASTRODUODENOSCOPY (EGD);  Surgeon: Wilhelmenia Aloha Raddle., MD;  Location: [facility] ENDOSCOPY;  Service: Gastroenterology;  Laterality: N/A;   ESOPHAGOGASTRODUODENOSCOPY N/A 11/14/2020   Procedure: ESOPHAGOGASTRODUODENOSCOPY (EGD);  Surgeon: Wilhelmenia Aloha Raddle., MD;  Location: [facility] ENDOSCOPY;  Service: Gastroenterology;  Laterality: N/A;   ESOPHAGOGASTRODUODENOSCOPY (EGD) WITH PROPOFOL  N/A 11/09/2020   Procedure: ESOPHAGOGASTRODUODENOSCOPY (EGD) WITH PROPOFOL ;  Surgeon: Wilhelmenia Aloha Raddle., MD;  Location: [facility] ENDOSCOPY;  Service: Gastroenterology;  Laterality: N/A;   ESOPHAGOGASTRODUODENOSCOPY (EGD) WITH PROPOFOL  N/A 11/07/2020   Procedure: ESOPHAGOGASTRODUODENOSCOPY (EGD) WITH PROPOFOL ;  Surgeon: Wilhelmenia Aloha Raddle., MD;  Location: [facility] ENDOSCOPY;  Service: Gastroenterology;  Laterality: N/A;   ESOPHAGOGASTRODUODENOSCOPY (EGD) WITH PROPOFOL  N/A 12/20/2020   Procedure: ESOPHAGOGASTRODUODENOSCOPY (EGD) WITH PROPOFOL ;  Surgeon: Wilhelmenia Aloha Raddle., MD;  Location: [facility]  ENDOSCOPY;  Service: Gastroenterology;  Laterality: N/A;   ESOPHAGOGASTRODUODENOSCOPY (EGD) WITH PROPOFOL  N/A 06/26/2021   Procedure: ESOPHAGOGASTRODUODENOSCOPY (EGD) WITH PROPOFOL ;  Surgeon: Wilhelmenia Aloha Raddle., MD;  Location: [facility] ENDOSCOPY;  Service: Gastroenterology;  Laterality: N/A;   ESOPHAGOGASTRODUODENOSCOPY (EGD) WITH PROPOFOL  N/A 07/24/2021   Procedure: ESOPHAGOGASTRODUODENOSCOPY (EGD) WITH PROPOFOL ;  Surgeon: Wilhelmenia Aloha Raddle., MD;  Location: [facility] ENDOSCOPY;  Service: Gastroenterology;  Laterality: N/A;  AXIOS STENT   ESOPHAGOGASTRODUODENOSCOPY (EGD) WITH PROPOFOL  N/A 05/22/2022   Procedure: ESOPHAGOGASTRODUODENOSCOPY (EGD) WITH PROPOFOL ;  Surgeon: Wilhelmenia Aloha Raddle., MD;  Location: [facility] ENDOSCOPY;  Service: Gastroenterology;  Laterality: N/A;   ESOPHAGOGASTRODUODENOSCOPY (EGD) WITH PROPOFOL  N/A 06/16/2022   Procedure: ESOPHAGOGASTRODUODENOSCOPY (EGD) WITH PROPOFOL ;  Surgeon: Wilhelmenia Aloha Raddle., MD;  Location: [facility] ENDOSCOPY;  Service: Gastroenterology;  Laterality: N/A;   ESOPHAGOGASTRODUODENOSCOPY (EGD) WITH PROPOFOL  N/A 07/30/2022   Procedure: ESOPHAGOGASTRODUODENOSCOPY (EGD) WITH PROPOFOL ;  Surgeon: Wilhelmenia Aloha Raddle., MD;  Location: [facility] ENDOSCOPY;  Service: Gastroenterology;  Laterality: N/A;   ESOPHAGOGASTRODUODENOSCOPY (EGD) WITH PROPOFOL  N/A 03/30/2023   Procedure: ESOPHAGOGASTRODUODENOSCOPY (EGD) WITH PROPOFOL ;  Surgeon: Avram Lupita BRAVO, MD;  Location: [facility] ENDOSCOPY;  Service: Gastroenterology;  Laterality: N/A;   EUS  11/14/2020   Procedure: UPPER ENDOSCOPIC ULTRASOUND (EUS) LINEAR;  Surgeon: Wilhelmenia Aloha Raddle., MD;  Location: [facility] ENDOSCOPY;  Service: Gastroenterology;;   EUS N/A 06/26/2021   Procedure: UPPER ENDOSCOPIC ULTRASOUND (EUS) RADIAL;  Surgeon: Wilhelmenia Aloha Raddle., MD;  Location: [facility] ENDOSCOPY;  Service: Gastroenterology;  Laterality: N/A;   EUS N/A 05/22/2022   Procedure: UPPER ENDOSCOPIC ULTRASOUND (EUS) LINEAR;  Surgeon: Wilhelmenia Aloha Raddle., MD;  Location: [facility] ENDOSCOPY;  Service: Gastroenterology;  Laterality: N/A;   EUS N/A 06/16/2022   Procedure: UPPER ENDOSCOPIC ULTRASOUND (EUS) LINEAR;  Surgeon: Wilhelmenia Aloha Raddle., MD;  Location: [facility] ENDOSCOPY;  Service: Gastroenterology;  Laterality: N/A;   FINE NEEDLE ASPIRATION  05/22/2022   Procedure: FINE NEEDLE ASPIRATION;  Surgeon: Wilhelmenia Aloha Raddle., MD;  Location: [facility];  Service: Gastroenterology;;   GASTROINTESTINAL STENT REMOVAL  12/20/2020   Procedure: GASTROINTESTINAL STENT REMOVAL;  Surgeon: Wilhelmenia Aloha Raddle., MD;  Location: [facility] ENDOSCOPY;  Service: Gastroenterology;;  cyst gastrostomy stent and double pig tail stents x2 removed   HERNIA REPAIR Left 2002   groin   INGUINAL HERNIA REPAIR Left 04/05/2017   Procedure: RECURRENT HERNIA REPAIR INGUINAL ADULT WITH MESH;  Surgeon: Mavis Anes, MD;  Location: [facility];  Service: General;  Laterality: Left;   MASS EXCISION Right 04/29/2020   Procedure: EXCISION MASS RIGHT WRIST;  Surgeon: Murrell Drivers, MD;  Location: [facility];  Service: Orthopedics;  Laterality: Right;   MASS EXCISION Right 10/09/2020   Procedure: EXCISION MASS, ABDOMINAL WALL, 2CM;  Surgeon: Kallie Manuelita BROCKS, MD;  Location: [facility];  Service: General;  Laterality: Right;   PANCREATIC STENT PLACEMENT  06/26/2021   Procedure: AXIOS STENT PLACEMENT;  Surgeon: Wilhelmenia Aloha Raddle., MD;  Location: [facility] ENDOSCOPY;  Service: Gastroenterology;;   PANCREATIC STENT PLACEMENT  06/16/2022   Procedure: PANCREATIC STENT PLACEMENT;  Surgeon: Wilhelmenia Aloha Raddle., MD;  Location: [facility];  Service: Gastroenterology;;   REMOVAL OF STONES  12/20/2020   Procedure: REMOVAL OF STONES;  Surgeon: Wilhelmenia Aloha Raddle., MD;  Location: [facility];  Service: Gastroenterology;;   REMOVAL OF STONES  07/24/2021   Procedure: REMOVAL OF STONES;  Surgeon: Wilhelmenia Aloha Raddle., MD;  Location: [facility] ENDOSCOPY;  Service: Gastroenterology;;   REMOVAL  OF STONES  05/22/2022   Procedure: REMOVAL OF STONES;  Surgeon: Wilhelmenia Aloha Raddle., MD;  Location: [facility];  Service: Gastroenterology;;   ANNETT N/A 10/10/2020   Procedure: ANNETT;  Surgeon: Golda Claudis PENNER, MD;  Location: [facility];  Service: Endoscopy;  Laterality: N/A;   SPHINCTEROTOMY  07/24/2021  Procedure: SPHINCTEROTOMY;  Surgeon: Wilhelmenia Aloha Raddle., MD;  Location: [facility] ENDOSCOPY;  Service: Gastroenterology;;   SPLENECTOMY, TOTAL N/A 11/17/2022   Procedure: SPLENECTOMY;  Surgeon: Dasie Leonor CROME, MD;  Location: [facility] OR;  Service: General;  Laterality: N/A;   SPYGLASS CHOLANGIOSCOPY N/A 12/20/2020   Procedure: DEBHOJDD CHOLANGIOSCOPY;  Surgeon: Wilhelmenia Aloha Raddle., MD;  Location: [facility] ENDOSCOPY;  Service: Gastroenterology;  Laterality: N/A;   SPYGLASS CHOLANGIOSCOPY N/A 07/24/2021   Procedure: SPYGLASS CHOLANGIOSCOPY;  Surgeon: Wilhelmenia Aloha Raddle., MD;  Location: [facility] ENDOSCOPY;  Service: Gastroenterology;  Laterality: N/A;   STENT REMOVAL  11/09/2020   Procedure: STENT REMOVAL;  Surgeon: Wilhelmenia Aloha Raddle., MD;  Location: [facility] ENDOSCOPY;  Service: Gastroenterology;;   CLEDA REMOVAL  11/11/2020   Procedure: STENT REMOVAL;  Surgeon: Wilhelmenia Aloha Raddle., MD;  Location: [facility] ENDOSCOPY;  Service: Gastroenterology;;   CLEDA REMOVAL  11/14/2020   Procedure: STENT REMOVAL;  Surgeon: Wilhelmenia Aloha Raddle., MD;  Location: [facility] ENDOSCOPY;  Service: Gastroenterology;;   CLEDA REMOVAL  12/20/2020   Procedure: STENT REMOVAL;  Surgeon: Wilhelmenia Aloha Raddle., MD;  Location: [facility];  Service: Gastroenterology;;  biliary x2   STENT REMOVAL  07/24/2021   Procedure: AXIOS STENT REMOVAL;  Surgeon: Wilhelmenia Aloha Raddle., MD;  Location: [facility] ENDOSCOPY;  Service: Gastroenterology;;   CLEDA REMOVAL  07/30/2022   Procedure: STENT REMOVAL;  Surgeon: Wilhelmenia Aloha Raddle., MD;  Location: [facility] ENDOSCOPY;  Service: Gastroenterology;;   UPPER ESOPHAGEAL ENDOSCOPIC  ULTRASOUND (EUS) N/A 11/07/2020   Procedure: UPPER ESOPHAGEAL ENDOSCOPIC ULTRASOUND (EUS);  Surgeon: Wilhelmenia Aloha Raddle., MD;  Location: [facility] ENDOSCOPY;  Service: Gastroenterology;  Laterality: N/A;   UPPER GASTROINTESTINAL ENDOSCOPY     WOUND DEBRIDEMENT  11/09/2020   Procedure: CYST NECROSECTOMY;  Surgeon: Wilhelmenia, Aloha Raddle., MD;  Location: [facility] ENDOSCOPY;  Service: Gastroenterology;;   Current Facility-Administered Medications  Medication Dose Route Frequency Provider Last Rate Last Admin   0.9 %  sodium chloride  infusion   Intravenous Continuous Mansouraty, Aloha Raddle., MD       lactated ringers  infusion    Continuous PRN Mansouraty, Aloha Raddle., MD 10 mL/hr at 06/12/24 1024 1,000 mL at 06/12/24 1024    Current Facility-Administered Medications:    0.9 %  sodium chloride  infusion, , Intravenous, Continuous, Mansouraty, Aloha Raddle., MD   lactated ringers  infusion, , , Continuous PRN, Mansouraty, Aloha Raddle., MD, Last Rate: 10 mL/hr at 06/12/24 1024, 1,000 mL at 06/12/24 1024 Allergies  Allergen Reactions   Desipramine Nausea Only and Rash   Family History  Problem Relation Age of Onset   Diabetes Father    Heart disease Father    Heart disease Maternal Grandmother    Stroke Maternal Grandfather    Colon cancer Neg Hx    Esophageal cancer Neg Hx    Inflammatory bowel disease Neg Hx    Liver disease Neg Hx    Pancreatic cancer Neg Hx    Rectal cancer Neg Hx    Stomach cancer Neg Hx    Social History   Socioeconomic History   Marital status: Single    Spouse name: Not on file   Number of children: 0   Years of education: Not on file   Highest education level: GED or equivalent  Occupational History   Not on file  Tobacco Use   Smoking status: Every Day    Current packs/day: 0.25    Average packs/day: 0.3 packs/day for 26.0 years (6.5 ttl pk-yrs)    Types: Cigarettes   Smokeless tobacco: Never  Tobacco comments:    4-5 cig daily as of 10/07/2020.  Vaping Use    Vaping status: Never Used  Substance and Sexual Activity   Alcohol use: No   Drug use: No   Sexual activity: Never    Birth control/protection: None  Other Topics Concern   Not on file  Social History Narrative   Not on file   Social Drivers of Health   Financial Resource Strain: Low Risk  (07/27/2023)   Overall Financial Resource Strain (CARDIA)    Difficulty of Paying Living Expenses: Not very hard  Food Insecurity: No Food Insecurity (08/16/2023)   Hunger Vital Sign    Worried About Running Out of Food in the Last Year: Never true    Ran Out of Food in the Last Year: Never true  Recent Concern: Food Insecurity - Food Insecurity Present (07/27/2023)   Hunger Vital Sign    Worried About Running Out of Food in the Last Year: Never true    Ran Out of Food in the Last Year: Sometimes true  Transportation Needs: No Transportation Needs (08/16/2023)   PRAPARE - Administrator, Civil Service (Medical): No    Lack of Transportation (Non-Medical): No  Physical Activity: Insufficiently Active (07/27/2023)   Exercise Vital Sign    Days of Exercise per Week: 2 days    Minutes of Exercise per Session: 10 min  Stress: No Stress Concern Present (07/27/2023)   [employer] of Occupational Health - Occupational Stress Questionnaire    Feeling of Stress : Only a little  Social Connections: Unknown (07/27/2023)   Social Connection and Isolation Panel    Frequency of Communication with Friends and Family: Three times a week    Frequency of Social Gatherings with Friends and Family: Once a week    Attends Religious Services: Patient declined    Active Member of Clubs or Organizations: No    Attends Engineer, Structural: Not on file    Marital Status: Divorced  Intimate Partner Violence: Not At Risk (08/16/2023)   Humiliation, Afraid, Rape, and Kick questionnaire    Fear of Current or Ex-Partner: No    Emotionally Abused: No    Physically Abused: No    Sexually Abused: No     Physical Exam: Today's Vitals   06/05/24 1333 06/12/24 1010  BP:  134/85  Pulse:  73  Resp:  18  Temp:  98.7 F (37.1 C)  TempSrc:  Temporal  SpO2:  94%  Weight: 87 kg 92.5 kg  Height:  5' 10 (1.778 m)  PainSc:  3    Body mass index is 29.27 kg/m. GEN: NAD EYE: Sclerae anicteric ENT: MMM CV: Non-tachycardic GI: Protuberant abdomen, 3-4/10 pain in upper abdomen preprocedure  NEURO:  Alert & Oriented x 3  Lab Results: No results for input(s): WBC, HGB, HCT, PLT in the last 72 hours. BMET No results for input(s): NA, K, CL, CO2, GLUCOSE, BUN, CREATININE, CALCIUM  in the last 72 hours. LFT No results for input(s): PROT, ALBUMIN , AST, ALT, ALKPHOS, BILITOT, BILIDIR, IBILI in the last 72 hours. PT/INR No results for input(s): LABPROT, INR in the last 72 hours.   Impression / Plan: This is a 53 y.o.male who presents for ERCP for evaluation of biliary stent removal and prior inflammatory stricture.  The risks of an ERCP were discussed at length, including but not limited to the risk of perforation, bleeding, abdominal pain, post-ERCP pancreatitis (while usually mild can be severe and even life  threatening).   The risks and benefits of endoscopic evaluation/treatment were discussed with the patient and/or family; these include but are not limited to the risk of perforation, infection, bleeding, missed lesions, lack of diagnosis, severe illness requiring hospitalization, as well as anesthesia and sedation related illnesses.  The patient's history has been reviewed, patient examined, no change in status, and deemed stable for procedure.  The patient and/or family was provided an opportunity to ask questions and all were answered.  The patient and/or family is agreeable to proceed.    Aloha Finner, MD [facility]oscopy Office # [phone]

## 2024-06-12 NOTE — Anesthesia Preprocedure Evaluation (Addendum)
 Anesthesia Evaluation  Patient identified by MRN, date of birth, ID band Patient awake    Reviewed: Allergy & Precautions, NPO status , Patient's Chart, lab work & pertinent test results  Airway Mallampati: III  TM Distance: >3 FB Neck ROM: Full    Dental  (+) Dental Advisory Given, Edentulous Upper, Edentulous Lower   Pulmonary sleep apnea , COPD, Current Smoker and Patient abstained from smoking.   Pulmonary exam normal breath sounds clear to auscultation       Cardiovascular hypertension, + CAD  Normal cardiovascular exam Rhythm:Regular Rate:Normal     Neuro/Psych  PSYCHIATRIC DISORDERS Anxiety Depression Bipolar Disorder Schizophrenia  negative neurological ROS     GI/Hepatic negative GI ROS,,,Common bile duct (CBD) stricture   Endo/Other  diabetes, Type 2, Insulin  Dependent, Oral Hypoglycemic Agents    Renal/GU negative Renal ROS     Musculoskeletal negative musculoskeletal ROS (+)    Abdominal   Peds  Hematology negative hematology ROS (+)   Anesthesia Other Findings Day of surgery medications reviewed with the patient.  Reproductive/Obstetrics                              Anesthesia Physical Anesthesia Plan  ASA: 3  Anesthesia Plan: General   Post-op Pain Management: Minimal or no pain anticipated   Induction: Intravenous  PONV Risk Score and Plan: 1 and Dexamethasone  and Ondansetron   Airway Management Planned: Oral ETT  Additional Equipment:   Intra-op Plan:   Post-operative Plan: Extubation in OR  Informed Consent: I have reviewed the patients History and Physical, chart, labs and discussed the procedure including the risks, benefits and alternatives for the proposed anesthesia with the patient or authorized representative who has indicated his/her understanding and acceptance.     Dental advisory given  Plan Discussed with: CRNA  Anesthesia Plan Comments:           Anesthesia Quick Evaluation

## 2024-06-12 NOTE — Transfer of Care (Signed)
 Immediate Anesthesia Transfer of Care Note  Patient: Steve Romero  Procedure(s) Performed: ERCP, WITH INTERVENTION IF INDICATED STENT REMOVAL DILATION, STRICTURE, BILE DUCT BRUSH BIOPSY, BILE DUCT  Patient Location: PACU and Endoscopy Unit  Anesthesia Type:General  Level of Consciousness: awake, alert , oriented, and patient cooperative  Airway & Oxygen Therapy: Patient Spontanous Breathing and Patient connected to face mask oxygen  Post-op Assessment: Report given to RN, Post -op Vital signs reviewed and stable, and Patient moving all extremities  Post vital signs: Reviewed and stable  Last Vitals:  Vitals Value Taken Time  BP 170/100 06/12/24 11:55  Temp    Pulse 66 06/12/24 11:57  Resp    SpO2 100 % 06/12/24 11:57  Vitals shown include unfiled device data.  Last Pain:  Vitals:   06/12/24 1010  TempSrc: Temporal  PainSc: 3          Complications: No notable events documented.

## 2024-06-12 NOTE — Telephone Encounter (Signed)
 Can you reach out to patient tomorrow, and see if we can get him in the next week to get a HFP. This can be done through PCP in [place] (we have done before) or can come to our office. This is since his lab did not get to be analyzed correctly (hemolyzed). Thanks. GM

## 2024-06-12 NOTE — Telephone Encounter (Signed)
 Lab order has been entered and pt advised via My Chart- he does view the messages and prefers this form of communication

## 2024-06-12 NOTE — Op Note (Signed)
 [facility] Patient Name: Steve Romero Procedure Date: 06/12/2024 MRN: [id number] Attending MD: Aloha Finner , MD, [id number] Date of Birth: 03/19/1971 CSN: [id number] Age: 53 Admit Type: Outpatient Procedure:                ERCP Indications:              Biliary stent removal Providers:                Aloha Finner, MD, Randall Lines, RN, Jasmine                            Petiford, Technician, Armida LABOR. Armistead, CRNA Referring MD:              Medicines:                General Anesthesia, Cipro  400 mg IV, Diclofenac  100                            mg rectal Complications:            No immediate complications. Estimated Blood Loss:     Estimated blood loss was minimal. Procedure:                Pre-Anesthesia Assessment:                           - Prior to the procedure, a History and Physical                            was performed, and patient medications and                            allergies were reviewed. The patient's tolerance of                            previous anesthesia was also reviewed. The risks                            and benefits of the procedure and the sedation                            options and risks were discussed with the patient.                            All questions were answered, and informed consent                            was obtained. Prior Anticoagulants: The patient has                            taken no anticoagulant or antiplatelet agents                            except for aspirin . ASA Grade Assessment: III - A  patient with severe systemic disease. After                            reviewing the risks and benefits, the patient was                            deemed in satisfactory condition to undergo the                            procedure.                           After obtaining informed consent, the scope was                            passed under direct vision.  Throughout the                            procedure, the patient's blood pressure, pulse, and                            oxygen saturations were monitored continuously. The                            [employer] D single use                            duodenoscope was introduced through the mouth, and                            used to inject contrast into and used to cannulate                            the bile duct. The ERCP was somewhat difficult due                            to abnormal anatomy. Successful completion of the                            procedure was aided by changing the patient's                            position and using manual pressure. The patient                            tolerated the procedure. Scope In: Scope Out: Findings:      A scout film of the abdomen was obtained. A metal biliary stent and       surgical clips consistent with a previous cholecystectomy were seen.      The esophagus was successfully intubated under direct vision without       detailed examination of the pharynx, larynx, and associated structures.       The upper GI tract was traversed under direct vision without detailed       examination.  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 A biliary       sphincterotomy had been performed. The sphincterotomy appeared open. One       covered metal biliary stent originating in the biliary tree was emerging       from the major papilla. The stent was visibly patent. One stent was       removed from the biliary tree using a Raptor grasping device and sent       for cytology.      A 0.035 inch x 260 cm straight Hydra Jagwire was passed into the biliary       tree on first attempt. The Hydratome sphincterotome was passed over the       guidewire and the bile duct was then deeply  cannulated. Contrast was       injected. I personally interpreted the bile duct images. Ductal flow of       contrast was adequate. Image quality was adequate. Contrast extended to       the hepatic ducts. Opacification of the entire biliary tree except for       the cystic duct and gallbladder was successful. The entire biliary tree       was moderately dilated. The largest diameter was 15 mm. The lower third       of the main bile duct contained a localized irregularity from having had       the distal biliary stent in place/position. To discover objects, the       biliary tree was swept with a retrieval balloon with a 15 mm sizing.       Sludge was swept from the duct and the balloon passed with ease.       Dilation of the distal common bile duct with a 04-16-11 mm balloon (to a       maximum balloon size of 12 mm) dilator was successful in effort of       trying to optimize previous stricture dilation. Cells for cytology were       obtained by brushing in the lower third of the main bile duct. An       occlusion cholangiogram was performed that showed no further significant       biliary pathology.      A pancreatogram was not performed.      The duodenoscope was withdrawn from the patient. Impression:               - Significant J-shaped deformity of the entire                            stomach.                           - Prior biliary sphincterotomy appeared open.                           - One visibly patent stent from the biliary tree                            was seen in the major papilla. This was  removed and                            sent for cytology purposes.                           - The entire biliary tree was moderately dilated.                           - An irregularity was found in the lower third of                            the main bile duct (from previous biliary stent                            having been in place). This area was dilated and                             brushed for cytology purposes.                           - The biliary tree was swept and sludge was found. Moderate Sedation:      Not Applicable - Patient had care per Anesthesia. Recommendation:           - The patient will be observed post-procedure,                            until all discharge criteria are met.                           - Discharge patient to home.                           - Patient has a contact number available for                            emergencies. The signs and symptoms of potential                            delayed complications were discussed with the                            patient. Return to normal activities tomorrow.                            Written discharge instructions were provided to the                            patient.                           - Low fat diet.                           - Follow-up cytology.                           -  Follow-up HFP.                           - Watch for pancreatitis, bleeding, perforation,                            and cholangitis.                           - Plan for pancreas protocol CT abdomen in 4 weeks.                           - Follow-up in clinic thereafter.                           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 Time will tell.                           - The findings and recommendations were discussed                            with the patient.                           - The findings and recommendations were discussed                            with the patient's family. Procedure Code(s):        --- Professional ---                           [phone], 59, Endoscopic retrograde                            cholangiopancreatography (ERCP); with                            trans-endoscopic balloon dilation of                             biliary/pancreatic duct(s) or of ampulla                            (sphincteroplasty), including sphincterotomy, when                            performed, each duct                           43275, Endoscopic retrograde  cholangiopancreatography (ERCP); with removal of                            foreign body(s) or stent(s) from biliary/pancreatic                            duct(s)                           43264, Endoscopic retrograde                            cholangiopancreatography (ERCP); with removal of                            calculi/debris from biliary/pancreatic duct(s)                           74328, 26, Endoscopic catheterization of the                            biliary ductal system, radiological supervision and                            interpretation Diagnosis Code(s):        --- Professional ---                           K31.89, Other diseases of stomach and duodenum                           Z96.89, Presence of other specified functional                            implants                           Z46.59, Encounter for fitting and adjustment of                            other gastrointestinal appliance and device                           K83.8, Other specified diseases of biliary tract                           R93.2, Abnormal findings on diagnostic imaging of                            liver and biliary tract CPT copyright 2022 American Medical Association. All rights reserved. The codes documented in this report are preliminary and upon coder review may  be revised to meet current compliance requirements. Aloha Finner, MD 06/12/2024 11:53:26 AM Number of Addenda: 0

## 2024-06-12 NOTE — Anesthesia Procedure Notes (Addendum)
 Procedure Name: Intubation Date/Time: 06/12/2024 10:44 AM  Performed by: Memory Armida LABOR, CRNAPre-anesthesia Checklist: Patient identified, Emergency Drugs available, Suction available, Patient being monitored and Timeout performed Patient Re-evaluated:Patient Re-evaluated prior to induction Oxygen Delivery Method: Circle system utilized Preoxygenation: Pre-oxygenation with 100% oxygen Induction Type: IV induction Ventilation: Mask ventilation without difficulty Laryngoscope Size: Mac and 4 Grade View: Grade I Tube type: Oral Tube size: 7.5 mm Number of attempts: 1 Airway Equipment and Method: Stylet Placement Confirmation: ETT inserted through vocal cords under direct vision, positive ETCO2 and breath sounds checked- equal and bilateral Secured at: 22 cm Tube secured with: Tape Dental Injury: Teeth and Oropharynx as per pre-operative assessment

## 2024-06-13 ENCOUNTER — Encounter (HOSPITAL_COMMUNITY): Payer: Self-pay | Admitting: Gastroenterology

## 2024-06-13 NOTE — Anesthesia Postprocedure Evaluation (Signed)
 Anesthesia Post Note  Patient: Steve Romero  Procedure(s) Performed: ERCP, WITH INTERVENTION IF INDICATED STENT REMOVAL DILATION, STRICTURE, BILE DUCT BRUSH BIOPSY, BILE DUCT     Patient location during evaluation: PACU Anesthesia Type: General Level of consciousness: awake and alert Pain management: pain level controlled Vital Signs Assessment: post-procedure vital signs reviewed and stable Respiratory status: spontaneous breathing, nonlabored ventilation and respiratory function stable Cardiovascular status: blood pressure returned to baseline and stable Postop Assessment: no apparent nausea or vomiting Anesthetic complications: no   No notable events documented.  Last Vitals:  Vitals:   06/12/24 1240 06/12/24 1245  BP: (!) 167/96 (!) 154/72  Pulse: 73 72  Resp: 12 18  Temp:    SpO2: 94% 95%    Last Pain:  Vitals:   06/12/24 1245  TempSrc:   PainSc: 4                  Garnette FORBES Skillern

## 2024-06-14 LAB — CYTOLOGY - NON PAP

## 2024-06-15 ENCOUNTER — Other Ambulatory Visit: Payer: Self-pay

## 2024-06-15 ENCOUNTER — Ambulatory Visit: Payer: Self-pay | Admitting: Gastroenterology

## 2024-06-15 DIAGNOSIS — K831 Obstruction of bile duct: Secondary | ICD-10-CM

## 2024-06-15 DIAGNOSIS — K863 Pseudocyst of pancreas: Secondary | ICD-10-CM

## 2024-06-15 DIAGNOSIS — K859 Acute pancreatitis without necrosis or infection, unspecified: Secondary | ICD-10-CM

## 2024-06-19 ENCOUNTER — Telehealth: Payer: Self-pay | Admitting: Gastroenterology

## 2024-06-19 NOTE — Telephone Encounter (Signed)
 Inbound call from patient stating that he was order a CT scan from Dr. Wilhelmenia and was schedule for tomorrow over at the [facility]. Patient stated that he received a call from [facility] stating that they had cancelled his CT due to his insurance not covering that CT scan. Patient is unsure what to do next. Please advise.

## 2024-06-19 NOTE — Telephone Encounter (Signed)
 April do you know anything about this CT scan?

## 2024-06-20 ENCOUNTER — Ambulatory Visit (HOSPITAL_COMMUNITY): Payer: MEDICAID

## 2024-06-20 NOTE — Telephone Encounter (Signed)
 Notice Date: 06/17/2024 Space intentionally blank PA #: [id number] This Action will take effect on: 06/17/2024 Call [phone] for help [facility] [address] [place], [place] [address] ALOHA Romero [address] [place], [place] [address] MID: [id number] Steve Romero Member: Steve Romero DOB: September 17, 1970 [facility]) manages your Medicaid services. On 06/15/2024, you or your provider asked us  to approve your request for health care services or items. [employer] contracts with [employer] ([employer]) to review requests made on behalf of [employer] plan members for some services. [employer] reviews service requests for medical necessity and appropriateness. WE DENIED YOUR REQUEST FOR RADIOLOGY SERVICES. IF YOU DO NOT AGREE WITH OUR DECISION, YOU CAN APPEAL IT. This letter tells you about our decision. Please read it carefully. You can ask for an appeal by mail, by fax, by phone, or in person. There are instructions in this notice that will tell you what to do. Please read them carefully. The last day to ask for an appeal is 08/16/2024. You have 60 calendar days from the date on this notice to ask for an appeal. If you need help filing your appeal, call us  at [phone].  1  COMMENTS: We denied your request for: 25821 Computed Tomography (CT), a special kind of picture of your abdomen (stomach area) and pelvis without and with contrast (dye) Policy rules found at [employer] Abdomen Imaging Guidelines Section(s): Chronic Pancreatitis (AB 33.2) and Preface to the Imaging Guidelines, section Preface-3.1 Clinical Information. guided our decision. Here are the policy requirements your request did not meet: Your doctor told us  that you have a chronic (long term) problem that causes painful swelling of the large organ behind your stomach (pancreatitis). The request cannot be approved because: Picture studies of more than one body region are not necessary to evaluate the area of concern in the  clinical information provided. A picture study of one body region is sufficient. The following code(s) will be approved if requested:  74170- A CT scan of your belly without and with the use of dye to enhance the image. This finding was based on [employer] Abdomen Imaging Guidelines Section(s): Chronic Pancreatitis (AB 33.2) and Preface to the Imaging Guidelines, section Preface-3.1 Clinical Information. Authority Supporting Decision: We base our decision to approve or deny a request for Medicaid services on:  Established Clinical Practice Guidelines, found on [employer]'s website at: https://www.[employer].com/provider/clinical-guidelines  Medicaid Clinical Coverage Policies found at: https://medicaid.https://www.frey.org/  10A NCAC 25A .0201: MEDICAL SERVICES All medical services performed must be medically necessary and may not be experimental in nature. Medical necessity is determined by generally accepted [place]  community practice standards as verified by independent Medicaid consultants.  The [place]  State Plan for Medical Assistance, found at: https://medicaid.betabros.nl If you want us  to send you a free copy of these documents, please call us  at [phone]. We will mail the documents to you before we finish your appeal. We can give you a free written copy of the full clinical rationale, rules, or standards that we used and information we generated when we made this decision. If you want a free copy, call us  at: [phone]. You also have the right to see your entire case file. Your case file includes all your medical records, other documents, and records. It may have more information about why your health care service was changed or not approved. To arrange to see your file, call us  at [phone]. If you say you want a copy of your entire case file in connection with the decision, we  will give  you or your authorized representative a free copy before we finish your appeal. Reviewed and determined by: Clinical Peer Reviewer - [employer].FILTER Medical Director Board Certified In Clinical Informatics, Nephrology [employer] cc: [facility]  AM [employer] [phone]. Case [id number]

## 2024-06-21 NOTE — Telephone Encounter (Signed)
 CT abdomen with and without contrast will be covered per insurance - 2 body parts not needed- reconsideration approved [id number]    Dr Wilhelmenia ok to change the order?

## 2024-06-21 NOTE — Telephone Encounter (Signed)
 I am OK with that order. Please change. GM

## 2024-06-21 NOTE — Addendum Note (Signed)
 Addended by: ANITRA ODETTA CROME on: 06/21/2024 11:23 AM   Modules accepted: Orders

## 2024-06-27 ENCOUNTER — Ambulatory Visit (HOSPITAL_COMMUNITY)
Admission: RE | Admit: 2024-06-27 | Discharge: 2024-06-27 | Disposition: A | Discharge status: Home / Self Care | Payer: MEDICAID | Source: Ambulatory Visit | Attending: Gastroenterology | Admitting: Gastroenterology

## 2024-06-27 DIAGNOSIS — K859 Acute pancreatitis without necrosis or infection, unspecified: Secondary | ICD-10-CM | POA: Diagnosis present

## 2024-06-27 DIAGNOSIS — K861 Other chronic pancreatitis: Secondary | ICD-10-CM | POA: Insufficient documentation

## 2024-06-27 LAB — POCT I-STAT CREATININE: Creatinine, Ser: 1 mg/dL (ref 0.61–1.24)

## 2024-06-27 MED ORDER — IOHEXOL 300 MG/ML  SOLN
100.0000 mL | Freq: Once | INTRAMUSCULAR | Status: AC | PRN
  Administered 2024-06-27: 100 mL via INTRAVENOUS

## 2024-07-12 ENCOUNTER — Ambulatory Visit: Payer: MEDICAID | Admitting: Family Medicine

## 2024-07-12 DIAGNOSIS — G8929 Other chronic pain: Secondary | ICD-10-CM

## 2024-07-12 DIAGNOSIS — K861 Other chronic pancreatitis: Secondary | ICD-10-CM | POA: Diagnosis not present

## 2024-07-12 DIAGNOSIS — R109 Unspecified abdominal pain: Secondary | ICD-10-CM

## 2024-07-12 DIAGNOSIS — F411 Generalized anxiety disorder: Secondary | ICD-10-CM | POA: Diagnosis not present

## 2024-07-12 DIAGNOSIS — R52 Pain, unspecified: Secondary | ICD-10-CM

## 2024-07-12 MED ORDER — HYDROXYZINE HCL 25 MG PO TABS: 12.5000 mg | ORAL_TABLET | Freq: Three times a day (TID) | ORAL | 2 refills | Status: AC | PRN

## 2024-07-12 MED ORDER — OXYCODONE HCL 10 MG PO TABS: 10.0000 mg | ORAL_TABLET | Freq: Four times a day (QID) | ORAL | 0 refills | Status: AC | PRN

## 2024-07-12 NOTE — Progress Notes (Signed)
 "  Subjective: CC: follow up chronic pain PCP: Jolinda Norene HERO, DO YEP:Steve Romero is a 54 y.o. male presenting to clinic today for:  Patient here for follow up on chronic pancreatic pain due to chronic pancreatitis.  He is status post biliary stent removal.  He had a CT scan done just prior to Christmas and is still waiting on those results.  Uncertain as to what next apps are.  He occasionally has some yellow stools but no other red flag signs or symptoms that he had when things were really bad.  He continues to get intermittent abdominal pain and utilizes usually 2 to 3 tablets of his pain medication at most 4 days/week but often he is able to skip it and deal with the pain on his own.  He denies any associated constipation continues a bowel regiment to prevent this.  He is tolerating p.o. intake without difficulty in fact ate pancakes and eggs prior to arrival but his blood sugar started dropping on the way here.  Sees endo in about 1 week.  Has not had DM eye exam but will get this set up.   ROS: Per HPI  Allergies[1] Past Medical History:  Diagnosis Date   Acute pancreatitis without necrosis or infection, unspecified    AKI (acute kidney injury) 05/17/2022   Anxiety 10/2014   Ascites    Bipolar disorder [facility]) age 59   COPD (chronic obstructive pulmonary disease) (HCC) 2016   Depression age 46   DKA (diabetic ketoacidosis) (HCC) 08/11/2023   Enlarged prostate    Hyperlipidemia 2016   Hypertension 2013   Nausea vomiting and diarrhea 06/15/2022   Schizoaffective disorder (HCC) 11/13/2014   Sleep apnea    Does not wear c-pap, sleeps in sitting up position per pt   Thyroid  disease 11/2014   Current Medications[2] Social History   Socioeconomic History   Marital status: Single    Spouse name: Not on file   Number of children: 0   Years of education: Not on file   Highest education level: GED or equivalent  Occupational History   Not on file  Tobacco Use   Smoking  status: Every Day    Current packs/day: 0.25    Average packs/day: 0.3 packs/day for 26.0 years (6.5 ttl pk-yrs)    Types: Cigarettes   Smokeless tobacco: Never   Tobacco comments:    4-5 cig daily as of 10/07/2020.  Vaping Use   Vaping status: Never Used  Substance and Sexual Activity   Alcohol use: No   Drug use: No   Sexual activity: Never    Birth control/protection: None  Other Topics Concern   Not on file  Social History Narrative   Not on file   Social Drivers of Health   Tobacco Use: High Risk (07/12/2024)   Patient History    Smoking Tobacco Use: Every Day    Smokeless Tobacco Use: Never    Passive Exposure: Not on file  Financial Resource Strain: Low Risk (07/12/2024)   Overall Financial Resource Strain (CARDIA)    Difficulty of Paying Living Expenses: Not very hard  Food Insecurity: No Food Insecurity (07/12/2024)   Epic    Worried About Radiation Protection Practitioner of Food in the Last Year: Never true    Ran Out of Food in the Last Year: Never true  Transportation Needs: No Transportation Needs (07/12/2024)   Epic    Lack of Transportation (Medical): No    Lack of Transportation (Non-Medical): No  Physical  Activity: Insufficiently Active (07/12/2024)   Exercise Vital Sign    Days of Exercise per Week: 7 days    Minutes of Exercise per Session: 20 min  Stress: Stress Concern Present (07/12/2024)   [employer] of Occupational Health - Occupational Stress Questionnaire    Feeling of Stress: To some extent  Social Connections: Socially Isolated (07/12/2024)   Social Connection and Isolation Panel    Frequency of Communication with Friends and Family: Three times a week    Frequency of Social Gatherings with Friends and Family: Once a week    Attends Religious Services: Patient declined    Database Administrator or Organizations: No    Attends Engineer, Structural: Not on file    Marital Status: Divorced  Intimate Partner Violence: Not At Risk (08/16/2023)   Humiliation,  Afraid, Rape, and Kick questionnaire    Fear of Current or Ex-Partner: No    Emotionally Abused: No    Physically Abused: No    Sexually Abused: No  Depression (PHQ2-9): Medium Risk (03/29/2024)   Depression (PHQ2-9)    PHQ-2 Score: 7  Alcohol Screen: Not on file  Housing: Unknown (07/12/2024)   Epic    Unable to Pay for Housing in the Last Year: No    Number of Times Moved in the Last Year: Not on file    Homeless in the Last Year: No  Utilities: Not At Risk (08/16/2023)   [facility] Utilities    Threatened with loss of utilities: No  Health Literacy: Not on file   Family History  Problem Relation Age of Onset   Diabetes Father    Heart disease Father    Heart disease Maternal Grandmother    Stroke Maternal Grandfather    Colon cancer Neg Hx    Esophageal cancer Neg Hx    Inflammatory bowel disease Neg Hx    Liver disease Neg Hx    Pancreatic cancer Neg Hx    Rectal cancer Neg Hx    Stomach cancer Neg Hx     Objective: Office vital signs reviewed. BP 119/75   Pulse 66   Temp (!) 81.1 F (27.3 C)   Ht 5' 10 (1.778 m)   Wt 208 lb 2 oz (94.4 kg)   SpO2 95%   BMI 29.86 kg/m   Physical Examination:  General: Awake, alert, well nourished, No acute distress HEENT: NO sclera icterus, MMM Cardio: regular rate and rhythm, S1S2 heard, no murmurs appreciated Pulm: clear to auscultation bilaterally, no wheezes, rhonchi or rales; normal work of breathing on room air     03/29/2024    1:10 PM 12/28/2023    9:18 AM 09/27/2023   11:40 AM  Depression screen PHQ 2/9  Decreased Interest 0 0 0  Down, Depressed, Hopeless 0 0 0  PHQ - 2 Score 0 0 0  Altered sleeping 0 0 1  Tired, decreased energy 0 0 1  Change in appetite 1 0 2  Feeling bad or failure about yourself  1 0 0  Trouble concentrating 2 0 0  Moving slowly or fidgety/restless 3 0 2  Suicidal thoughts 0 0 0  PHQ-9 Score 7  0  6   Difficult doing work/chores Somewhat difficult Not difficult at all Somewhat difficult      Data saved with a previous flowsheet row definition      03/29/2024    1:11 PM 12/28/2023    9:18 AM 09/27/2023   11:39 AM 09/27/2023   11:29 AM  GAD 7 : Generalized Anxiety Score  Nervous, Anxious, on Edge 3 0 0 0  Control/stop worrying 1 0 0 0  Worry too much - different things 1 0 0 0  Trouble relaxing 3 0 1 0  Restless 3 0 0 0  Easily annoyed or irritable 2 0 1 0  Afraid - awful might happen 0 0 1 0  Total GAD 7 Score 13 0 3 0  Anxiety Difficulty Somewhat difficult Not difficult at all Somewhat difficult Not difficult at all    Assessment/ Plan: 54 y.o. male   Chronic biliary pancreatitis (HCC) - Plan: Oxycodone  HCl 10 MG TABS, ToxASSURE Select 13 (MW), Urine  Chronic abdominal pain - Plan: Oxycodone  HCl 10 MG TABS, ToxASSURE Select 13 (MW), Urine  Pain management - Plan: Oxycodone  HCl 10 MG TABS, ToxASSURE Select 13 (MW), Urine, Drug Screen 10 W/Conf, Serum  Generalized anxiety disorder - Plan: hydrOXYzine  (ATARAX ) 25 MG tablet, ToxASSURE Select 13 (MW), Urine   Pain meds renewed.  UDS/ CSC updated today.  The Narcotic Database has been reviewed.  There were no red flags.    Atarax  renewed.   Follow up in 3 months.   Norene CHRISTELLA Fielding, DO [facility] [phone]     [1]  Allergies Allergen Reactions   Desipramine Nausea Only and Rash  [2]  Current Outpatient Medications:    Accu-Chek Softclix Lancets lancets, Check BS in the morning and at night Dx E11.9, Disp: 200 each, Rfl: 3   acetaminophen  (TYLENOL ) 500 MG tablet, Take 2 tablets (1,000 mg total) by mouth every 8 (eight) hours as needed for mild pain (pain score 1-3)., Disp: , Rfl:    albuterol  (VENTOLIN  HFA) 108 (90 Base) MCG/ACT inhaler, Inhale 2 puffs into the lungs every 6 (six) hours as needed for wheezing or shortness of breath., Disp: 8 g, Rfl: 0   aspirin  EC 81 MG tablet, Take 81-162 mg by mouth daily as needed (for pain). Swallow whole., Disp: , Rfl:    Continuous Blood  Gluc Receiver (FREESTYLE LIBRE READER) DEVI, 1 Units by Does not apply route daily. UAD to test BGs daily. Dx E11.9, Disp: 1 each, Rfl: 1   Continuous Glucose Receiver (FREESTYLE LIBRE 3 READER) DEVI, 1 Device by Does not apply route continuous., Disp: 1 each, Rfl: 0   Continuous Glucose Sensor (FREESTYLE LIBRE 3 PLUS SENSOR) MISC, Check BGs continuously. E11.9 Change sensor every 15 days., Disp: 6 each, Rfl: 3   Empagliflozin-metFORMIN HCl ER (SYNJARDY  XR) 11-998 MG TB24, Take 1 tablet by mouth 2 (two) times daily., Disp: 60 tablet, Rfl: 1   Glucagon  (BAQSIMI  ONE PACK) 3 MG/DOSE POWD, Place 1 Device into the nose as needed (Low blood sugar with impaired consciousness)., Disp: 2 each, Rfl: 3   glucose blood (ACCU-CHEK GUIDE TEST) test strip, Check BS in the morning and at night Dx E11.9, Disp: 200 each, Rfl: 3   hydrOXYzine  (ATARAX ) 25 MG tablet, Take 0.5-1 tablets (12.5-25 mg total) by mouth every 8 (eight) hours as needed for anxiety., Disp: 30 tablet, Rfl: 2   insulin  aspart (NOVOLOG  FLEXPEN) 100 UNIT/ML FlexPen, Inject 12-15 units three times daily before meals + sliding scale, max dose 70 units a day, Disp: 42 mL, Rfl: 3   insulin  glargine (LANTUS ) 100 UNIT/ML Solostar Pen, Inject 45 Units into the skin daily., Disp: 40.5 mL, Rfl: 3   Insulin  Pen Needle (BD PEN NEEDLE NANO 2ND GEN) 32G X 4 MM MISC, UAD to administer insulin   Dx E11.10, Disp: 100 each, Rfl: 3   lipase/protease/amylase (CREON ) 36000 UNITS CPEP capsule, Take 3 capsules with meals and 1-2  capsule with snacks ( up too 2 snacks ), Disp: 570 capsule, Rfl: 2   naloxone  (NARCAN ) nasal spray 4 mg/0.1 mL, Place 1 spray into the nose as directed., Disp: , Rfl:    ondansetron  (ZOFRAN ) 4 MG tablet, Take 4 mg by mouth every 6 (six) hours as needed for nausea or vomiting., Disp: , Rfl:    Oxycodone  HCl 10 MG TABS, Take 1 tablet (10 mg total) by mouth every 6 (six) hours as needed (for pain). To last 3 months, Disp: 90 tablet, Rfl: 0    rosuvastatin  (CRESTOR ) 10 MG tablet, Take 1 tablet (10 mg total) by mouth daily., Disp: 90 tablet, Rfl: 3  "

## 2024-07-14 ENCOUNTER — Ambulatory Visit: Payer: Self-pay | Admitting: Gastroenterology

## 2024-07-18 ENCOUNTER — Ambulatory Visit: Payer: MEDICAID | Admitting: "Endocrinology

## 2024-07-18 LAB — DRUG SCREEN 10 W/CONF, SERUM
Amphetamines, IA: NEGATIVE ng/mL
Barbiturates, IA: NEGATIVE ug/mL
Benzodiazepines, IA: NEGATIVE ng/mL
Cocaine & Metabolite, IA: NEGATIVE ng/mL
Methadone, IA: NEGATIVE ng/mL
Opiates, IA: NEGATIVE ng/mL
Oxycodones, IA: NEGATIVE ng/mL
Phencyclidine, IA: NEGATIVE ng/mL
Propoxyphene, IA: NEGATIVE ng/mL
THC(Marijuana) Metabolite, IA: POSITIVE ng/mL — AB

## 2024-07-18 LAB — THC,MS,WB/SP RFX
Cannabinoid Confirmation: POSITIVE
Carboxy-THC: 104.4 ng/mL
Hydroxy-THC: 3.5 ng/mL
Tetrahydrocannabinol(THC): 16.1 ng/mL

## 2024-07-24 ENCOUNTER — Ambulatory Visit: Payer: Self-pay | Admitting: Family Medicine

## 2024-07-24 ENCOUNTER — Encounter: Payer: Self-pay | Admitting: "Endocrinology

## 2024-07-24 ENCOUNTER — Ambulatory Visit: Payer: MEDICAID | Admitting: "Endocrinology

## 2024-07-24 VITALS — BP 130/80 | HR 74 | Ht 70.0 in | Wt 208.0 lb

## 2024-07-24 DIAGNOSIS — Z7984 Long term (current) use of oral hypoglycemic drugs: Secondary | ICD-10-CM

## 2024-07-24 DIAGNOSIS — E7800 Pure hypercholesterolemia, unspecified: Secondary | ICD-10-CM

## 2024-07-24 DIAGNOSIS — E11649 Type 2 diabetes mellitus with hypoglycemia without coma: Secondary | ICD-10-CM | POA: Diagnosis not present

## 2024-07-24 DIAGNOSIS — Z794 Long term (current) use of insulin: Secondary | ICD-10-CM

## 2024-07-24 MED ORDER — SYNJARDY XR 25-1000 MG PO TB24: 1.0000 | ORAL_TABLET | Freq: Every day | ORAL | 2 refills | Status: DC

## 2024-07-24 MED ORDER — SYNJARDY XR 25-1000 MG PO TB24: 1.0000 | ORAL_TABLET | Freq: Every day | ORAL | 2 refills | Status: AC

## 2024-07-24 NOTE — Progress Notes (Signed)
 "   Outpatient Endocrinology Note Steve Birmingham, MD  07/24/24   Steve Romero 06-13-1971 [id number]  Referring Provider: Jolinda Norene HERO, DO Primary Care Provider: Jolinda Norene HERO, DO Reason for consultation: Subjective   Assessment & Plan  Diagnoses and all orders for this visit:  Uncontrolled type 2 diabetes mellitus with hypoglycemia without coma (HCC) -     Lipid panel -     Microalbumin / creatinine urine ratio -     Comprehensive metabolic panel with GFR  Long term (current) use of oral hypoglycemic drugs  Long-term insulin  use (HCC)  Insulin  dose changed (HCC)  Pure hypercholesterolemia    Diabetes Type II complicated by neuropathy,  Lab Results  Component Value Date   GFR 99.32 12/02/2023   Hba1c goal less than 7, current Hba1c is  Lab Results  Component Value Date   HGBA1C 10.5 (A) 05/09/2024   Will recommend the following: Synjardy  XR 25/1000 mg qd  Lantus  45 units qam to target fasting blood sugar at 100-150 Novolog  10 units tid 15 min before meals Correction scale: Use in addition to your meal time/short acting insulin  (both together) based on blood sugars as follows:  151 - 180: 1 unit 181 - 210: 2 units 211 - 240: 3 units 241 - 270: 4 units 271 - 300: 5 units 301 - 330: 6 units 331 - 360: 7 units 361 - 390: 8 units 391 - 420/High : 9 units   11/09/23: Ordered diabetes education that patient got today, pt is drinking sweet drinks all day leading to high sugars, was not taking 20 units but only correction scale, had a low in 10/2023, discussed hypoglycemia management again, with plans to try CeQur for being compliant with short acting insulin  as well as sticking to 1 drink a day instead of drinking all day the sugary beverages 11/16/23 patient again got diabetes education today.  Patient repeatedly says that he has been doing everything right, taking up to 6 shots of insulin  a: day, 2 of the Semglee  and up to 4 of the NovoLog  15 units  with meals.  His blood sugars continue to stay high except rarely knows that patient keeps telling because of his pancreas.  Reinforced compliance and discussed the dangers of continuing dose elevation of Novolog  in case of noncompliance, patient understands and agreed 12/15/23: seen DM educator, using insulin  in scar tissue, drinking juices and mountain dew  01/03/24: ordered podiatry referral, r/o onychomycosis; discussed again about decreasing smoking 05/09/24: Recommend iLet beta bionic pump start as patient seems non-complaint; discussed with diabetes educator 07/24/24: confirms lows with a  BG meter.  Cut down short acting insulin  to half if eating only half a meal or if blood sugar is between 71-100 before a meal Skip short acting insulin  if blood sugar is less than 70 and treat with 15 gms of carbohydrates every 15 min until blood sugar is more than 100   No known contraindications/side effects to any of above medications Glucagon  discussed and prescribed with refills on 09/2023  -Last LD and Tg are as follows: Lab Results  Component Value Date   LDLCALC 98 09/14/2023    Lab Results  Component Value Date   TRIG 72 09/14/2023   -stopped rosuvastatin  10 mg every day, reinforced it previously, now taking it  -Follow low fat diet and exercise   -Blood pressure goal <140/90 - Microalbumin/creatinine goal is < 30 -Last MA/Cr is as follows: Lab Results  Component Value Date  MICROALBUR 0.5 09/14/2023   -not on ACE/ARB  -diet changes including salt restriction -limit eating outside -counseled BP targets per standards of diabetes care -uncontrolled blood pressure can lead to retinopathy, nephropathy and cardiovascular and atherosclerotic heart disease  Reviewed and counseled on: -A1C target -Blood sugar targets -Complications of uncontrolled diabetes  -Checking blood sugar before meals and bedtime and bring log next visit -All medications with mechanism of action and side  effects -Hypoglycemia management: rule of 15's, Glucagon  Emergency Kit and medical alert ID -low-carb low-fat plate-method diet -At least 20 minutes of physical activity per day -Annual dilated retinal eye exam and foot exam -compliance and follow up needs -follow up as scheduled or earlier if problem gets worse  Call if blood sugar is less than 70 or consistently above 250    Take a 15 gm snack of carbohydrate at bedtime before you go to sleep if your blood sugar is less than 100.    If you are going to fast after midnight for a test or procedure, ask your physician for instructions on how to reduce/decrease your insulin  dose.    Call if blood sugar is less than 70 or consistently above 250  -Treating a low sugar by rule of 15  (15 gms of sugar every 15 min until sugar is more than 70) If you feel your sugar is low, test your sugar to be sure If your sugar is low (less than 70), then take 15 grams of a fast acting Carbohydrate (3-4 glucose tablets or glucose gel or 4 ounces of juice or regular soda) Recheck your sugar 15 min after treating low to make sure it is more than 70 If sugar is still less than 70, treat again with 15 grams of carbohydrate          Don't drive the hour of hypoglycemia  If unconscious/unable to eat or drink by mouth, use glucagon  injection or nasal spray baqsimi  and call 911. Can repeat again in 15 min if still unconscious.  Return in about 4 weeks (around 08/21/2024) for visit and 8 am labs before next visit.   I have reviewed current medications, nurse's notes, allergies, vital signs, past medical and surgical history, family medical history, and social history for this encounter. Counseled patient on symptoms, examination findings, lab findings, imaging results, treatment decisions and monitoring and prognosis. The patient understood the recommendations and agrees with the treatment plan. All questions regarding treatment plan were fully answered.  Steve Birmingham, MD  07/24/24   History of Present Illness Steve Romero is a 54 y.o. year old male who presents for follow up of Type II diabetes mellitus.  Home diabetes regimen: Synjardy  XR 11/998 mg every day-cannot tolerate bid  Lantus  49 units qd Novolog  10 units 15-20 min tidac, + correction scale Not following correction scale accurately    COMPLICATIONS -  MI/Stroke -  retinopathy +  neuropathy -  nephropathy  BLOOD SUGAR DATA CGM interpretation: At today's visit, we reviewed her CGM downloads. The full report is scanned in the media. Reviewing the CGM trends, BG are low across the day (19%), but patient reports lowest being at 90s and did not feel any symptoms.   Physical Exam  BP 130/80   Pulse 74   Ht 5' 10 (1.778 m)   Wt 208 lb (94.3 kg)   SpO2 98%   BMI 29.84 kg/m    Constitutional: well developed, well nourished Head: normocephalic, atraumatic Eyes: sclera anicteric, no redness Neck:  supple Lungs: normal respiratory effort Neurology: alert and oriented Skin: dry, no appreciable rashes Musculoskeletal: no appreciable defects Psychiatric: normal mood and affect Diabetic Foot Exam - Simple   No data filed      Current Medications Patient's Medications  New Prescriptions   No medications on file  Previous Medications   ACCU-CHEK SOFTCLIX LANCETS LANCETS    Check BS in the morning and at night Dx E11.9   ACETAMINOPHEN  (TYLENOL ) 500 MG TABLET    Take 2 tablets (1,000 mg total) by mouth every 8 (eight) hours as needed for mild pain (pain score 1-3).   ALBUTEROL  (VENTOLIN  HFA) 108 (90 BASE) MCG/ACT INHALER    Inhale 2 puffs into the lungs every 6 (six) hours as needed for wheezing or shortness of breath.   ASPIRIN  EC 81 MG TABLET    Take 81-162 mg by mouth daily as needed (for pain). Swallow whole.   CONTINUOUS BLOOD GLUC RECEIVER (FREESTYLE LIBRE READER) DEVI    1 Units by Does not apply route daily. UAD to test BGs daily. Dx E11.9   CONTINUOUS GLUCOSE  RECEIVER (FREESTYLE LIBRE 3 READER) DEVI    1 Device by Does not apply route continuous.   CONTINUOUS GLUCOSE SENSOR (FREESTYLE LIBRE 3 PLUS SENSOR) MISC    Check BGs continuously. E11.9 Change sensor every 15 days.   EMPAGLIFLOZIN-METFORMIN HCL ER (SYNJARDY  XR) 11-998 MG TB24    Take 1 tablet by mouth 2 (two) times daily.   GLUCAGON  (BAQSIMI  ONE PACK) 3 MG/DOSE POWD    Place 1 Device into the nose as needed (Low blood sugar with impaired consciousness).   GLUCOSE BLOOD (ACCU-CHEK GUIDE TEST) TEST STRIP    Check BS in the morning and at night Dx E11.9   HYDROXYZINE  (ATARAX ) 25 MG TABLET    Take 0.5-1 tablets (12.5-25 mg total) by mouth every 8 (eight) hours as needed for anxiety.   INSULIN  ASPART (NOVOLOG  FLEXPEN) 100 UNIT/ML FLEXPEN    Inject 12-15 units three times daily before meals + sliding scale, max dose 70 units a day   INSULIN  GLARGINE (LANTUS ) 100 UNIT/ML SOLOSTAR PEN    Inject 45 Units into the skin daily.   INSULIN  PEN NEEDLE (BD PEN NEEDLE NANO 2ND GEN) 32G X 4 MM MISC    UAD to administer insulin  Dx E11.10   LIPASE/PROTEASE/AMYLASE (CREON ) 36000 UNITS CPEP CAPSULE    Take 3 capsules with meals and 1-2  capsule with snacks ( up too 2 snacks )   NALOXONE  (NARCAN ) NASAL SPRAY 4 MG/0.1 ML    Place 1 spray into the nose as directed.   ONDANSETRON  (ZOFRAN ) 4 MG TABLET    Take 4 mg by mouth every 6 (six) hours as needed for nausea or vomiting.   OXYCODONE  HCL 10 MG TABS    Take 1 tablet (10 mg total) by mouth every 6 (six) hours as needed (for pain). To last 3 months   ROSUVASTATIN  (CRESTOR ) 10 MG TABLET    Take 1 tablet (10 mg total) by mouth daily.  Modified Medications   No medications on file  Discontinued Medications   No medications on file    Allergies Allergies  Allergen Reactions   Desipramine Nausea Only and Rash    Past Medical History Past Medical History:  Diagnosis Date   Acute pancreatitis without necrosis or infection, unspecified    AKI (acute kidney injury)  05/17/2022   Anxiety 10/2014   Ascites    Bipolar disorder (HCC) age 60  COPD (chronic obstructive pulmonary disease) (HCC) 2016   Depression age 60   DKA (diabetic ketoacidosis) (HCC) 08/11/2023   Enlarged prostate    Hyperlipidemia 2016   Hypertension 2013   Nausea vomiting and diarrhea 06/15/2022   Schizoaffective disorder (HCC) 11/13/2014   Sleep apnea    Does not wear c-pap, sleeps in sitting up position per pt   Thyroid  disease 11/2014    Past Surgical History Past Surgical History:  Procedure Laterality Date   BALLOON DILATION N/A 11/07/2020   Procedure: BALLOON DILATION;  Surgeon: Wilhelmenia Aloha Raddle., MD;  [facility] ENDOSCOPY;  Service: Gastroenterology;  Laterality: N/A;   BALLOON DILATION N/A 06/26/2021   Procedure: BALLOON DILATION;  Surgeon: Wilhelmenia Aloha Raddle., MD;  Location: [facility] ENDOSCOPY;  Service: Gastroenterology;  Laterality: N/A;   BILIARY BRUSHING  09/23/2023   Procedure: BRUSH BIOPSY, BILE DUCT;  Surgeon: Wilhelmenia Aloha Raddle., MD;  Location: [facility];  Service: Gastroenterology;;   BILIARY BRUSHING  06/12/2024   Procedure: BRUSH BIOPSY, BILE DUCT;  Surgeon: Wilhelmenia Aloha Raddle., MD;  Location: [facility]OSCOPY;  Service: Gastroenterology;;   BILIARY DILATION  07/24/2021   Procedure: BILIARY DILATION;  Surgeon: Wilhelmenia Aloha Raddle., MD;  Location: [facility] ENDOSCOPY;  Service: Gastroenterology;;   BILIARY DILATION  05/22/2022   Procedure: BILIARY DILATION;  Surgeon: Wilhelmenia Aloha Raddle., MD;  Location: [facility];  Service: Gastroenterology;;   BILIARY DILATION  06/16/2022   Procedure: GASTROSTOMY DILATION;  Surgeon: Wilhelmenia Aloha Raddle., MD;  Location: [facility];  Service: Gastroenterology;;   BILIARY DILATION  06/12/2024   Procedure: DILATION, STRICTURE, BILE DUCT;  Surgeon: Wilhelmenia Aloha Raddle., MD;  Location: [facility];  Service: Gastroenterology;;   BILIARY STENT PLACEMENT N/A 10/10/2020   Procedure: BILIARY STENT PLACEMENT;   Surgeon: Golda Claudis PENNER, MD;  Location: [facility];  Service: Endoscopy;  Laterality: N/A;   BILIARY STENT PLACEMENT  11/09/2020   Procedure: BILIARY STENT PLACEMENT;  Surgeon: Wilhelmenia Aloha Raddle., MD;  Location: [facility] ENDOSCOPY;  Service: Gastroenterology;;   BILIARY STENT PLACEMENT  11/07/2020   Procedure: BILIARY STENT PLACEMENT;  Surgeon: Wilhelmenia Aloha Raddle., MD;  Location: [facility]OSCOPY;  Service: Gastroenterology;;   BILIARY STENT PLACEMENT  11/11/2020   Procedure: BILIARY STENT PLACEMENT;  Surgeon: Wilhelmenia Aloha Raddle., MD;  Location: [facility] ENDOSCOPY;  Service: Gastroenterology;;   BILIARY STENT PLACEMENT  11/14/2020   Procedure: BILIARY STENT PLACEMENT;  Surgeon: Wilhelmenia Aloha Raddle., MD;  Location: [facility] ENDOSCOPY;  Service: Gastroenterology;;   BILIARY STENT PLACEMENT N/A 01/29/2021   Procedure: BILIARY STENT PLACEMENT;  Surgeon: Wilhelmenia Aloha Raddle., MD;  Location: [facility];  Service: Gastroenterology;  Laterality: N/A;   BILIARY STENT PLACEMENT  06/26/2021   Procedure: BILIARY STENT PLACEMENT;  Surgeon: Wilhelmenia Aloha Raddle., MD;  Location: [facility] ENDOSCOPY;  Service: Gastroenterology;;   BILIARY STENT PLACEMENT  07/24/2021   Procedure: BILIARY STENT PLACEMENT;  Surgeon: Wilhelmenia Aloha Raddle., MD;  Location: [facility] ENDOSCOPY;  Service: Gastroenterology;;   BILIARY STENT PLACEMENT N/A 09/23/2023   Procedure: INSERTION, STENT, BILE DUCT;  Surgeon: Wilhelmenia Aloha Raddle., MD;  Location: [facility];  Service: Gastroenterology;  Laterality: N/A;   BIOPSY  11/07/2020   Procedure: BIOPSY;  Surgeon: Wilhelmenia Aloha Raddle., MD;  Location: [facility] ENDOSCOPY;  Service: Gastroenterology;;   BIOPSY  12/20/2020   Procedure: BIOPSY;  Surgeon: Wilhelmenia Aloha Raddle., MD;  Location: [facility];  Service: Gastroenterology;;   BIOPSY  06/26/2021   Procedure: BIOPSY;  Surgeon: Wilhelmenia Aloha Raddle., MD;  Location: [facility] ENDOSCOPY;  Service: Gastroenterology;;   ORIN MEDIATE  RELEASE Left  02/21/2016   Procedure: CARPAL TUNNEL RELEASE;  Surgeon: Taft FORBES Minerva, MD;  Location: [facility];  Service: Orthopedics;  Laterality: Left;   CHOLECYSTECTOMY N/A 10/09/2020   Procedure: LAPAROSCOPIC CHOLECYSTECTOMY;  Surgeon: Kallie Manuelita BROCKS, MD;  Location: [facility];  Service: General;  Laterality: N/A;   CYST ENTEROSTOMY N/A 11/07/2020   Procedure: CYST ENTEROSTOMY;  Surgeon: Wilhelmenia Aloha Raddle., MD;  Location: [facility] ENDOSCOPY;  Service: Gastroenterology;  Laterality: N/A;   CYST GASTROSTOMY  11/11/2020   Procedure: CYST NECROSECTOMY;  Surgeon: Wilhelmenia Aloha Raddle., MD;  Location: [facility] ENDOSCOPY;  Service: Gastroenterology;;   CYST GASTROSTOMY  11/14/2020   Procedure: CYST NECROSECTOMY;  Surgeon: Wilhelmenia Aloha Raddle., MD;  Location: [facility] ENDOSCOPY;  Service: Gastroenterology;;   CYST GASTROSTOMY  06/16/2022   Procedure: CYST GASTROSTOMY;  Surgeon: Wilhelmenia Aloha Raddle., MD;  Location: [facility] ENDOSCOPY;  Service: Gastroenterology;;   CYST REMOVAL HAND     CYSTOSCOPY  06/26/2021   Procedure: CYSTOGASTROSTOMY;  Surgeon: Mansouraty, Aloha Raddle., MD;  Location: [facility] ENDOSCOPY;  Service: Gastroenterology;;   ENDOSCOPIC RETROGRADE CHOLANGIOPANCREATOGRAPHY (ERCP) WITH PROPOFOL  N/A 11/09/2020   Procedure: ENDOSCOPIC RETROGRADE CHOLANGIOPANCREATOGRAPHY (ERCP) WITH PROPOFOL ;  Surgeon: Wilhelmenia Aloha Raddle., MD;  Location: [facility] ENDOSCOPY;  Service: Gastroenterology;  Laterality: N/A;   ENDOSCOPIC RETROGRADE CHOLANGIOPANCREATOGRAPHY (ERCP) WITH PROPOFOL  N/A 12/20/2020   Procedure: ENDOSCOPIC RETROGRADE CHOLANGIOPANCREATOGRAPHY (ERCP) WITH PROPOFOL ;  Surgeon: Wilhelmenia Aloha Raddle., MD;  Location: [facility] ENDOSCOPY;  Service: Gastroenterology;  Laterality: N/A;   ENDOSCOPIC RETROGRADE CHOLANGIOPANCREATOGRAPHY (ERCP) WITH PROPOFOL  N/A 01/29/2021   Procedure: ENDOSCOPIC RETROGRADE CHOLANGIOPANCREATOGRAPHY (ERCP) WITH PROPOFOL ;  Surgeon: Wilhelmenia Aloha Raddle., MD;  Location: [facility] ENDOSCOPY;  Service:  Gastroenterology;  Laterality: N/A;   ENDOSCOPIC RETROGRADE CHOLANGIOPANCREATOGRAPHY (ERCP) WITH PROPOFOL  N/A 07/24/2021   Procedure: ENDOSCOPIC RETROGRADE CHOLANGIOPANCREATOGRAPHY (ERCP) WITH PROPOFOL ;  Surgeon: Wilhelmenia Aloha Raddle., MD;  Location: [facility] ENDOSCOPY;  Service: Gastroenterology;  Laterality: N/A;   ENDOSCOPIC RETROGRADE CHOLANGIOPANCREATOGRAPHY (ERCP) WITH PROPOFOL  N/A 09/23/2023   Procedure: ENDOSCOPIC RETROGRADE CHOLANGIOPANCREATOGRAPHY (ERCP) WITH PROPOFOL ;  Surgeon: Wilhelmenia Aloha Raddle., MD;  Location: [facility] ENDOSCOPY;  Service: Gastroenterology;  Laterality: N/A;   ERCP N/A 10/10/2020   Procedure: ENDOSCOPIC RETROGRADE CHOLANGIOPANCREATOGRAPHY (ERCP);  Surgeon: Golda Claudis PENNER, MD;  Location: [facility];  Service: Endoscopy;  Laterality: N/A;   ERCP     ERCP N/A 05/22/2022   Procedure: ENDOSCOPIC RETROGRADE CHOLANGIOPANCREATOGRAPHY (ERCP);  Surgeon: Wilhelmenia Aloha Raddle., MD;  Location: [facility];  Service: Gastroenterology;  Laterality: N/A;   ERCP N/A 06/12/2024   Procedure: ERCP, WITH INTERVENTION IF INDICATED;  Surgeon: Wilhelmenia Aloha Raddle., MD;  Location: [facility] ENDOSCOPY;  Service: Gastroenterology;  Laterality: N/A;   ESOPHAGOGASTRODUODENOSCOPY N/A 11/11/2020   Procedure: ESOPHAGOGASTRODUODENOSCOPY (EGD);  Surgeon: Wilhelmenia Aloha Raddle., MD;  Location: [facility] ENDOSCOPY;  Service: Gastroenterology;  Laterality: N/A;   ESOPHAGOGASTRODUODENOSCOPY N/A 11/14/2020   Procedure: ESOPHAGOGASTRODUODENOSCOPY (EGD);  Surgeon: Wilhelmenia Aloha Raddle., MD;  Location: [facility] ENDOSCOPY;  Service: Gastroenterology;  Laterality: N/A;   ESOPHAGOGASTRODUODENOSCOPY (EGD) WITH PROPOFOL  N/A 11/09/2020   Procedure: ESOPHAGOGASTRODUODENOSCOPY (EGD) WITH PROPOFOL ;  Surgeon: Wilhelmenia Aloha Raddle., MD;  Location: [facility] ENDOSCOPY;  Service: Gastroenterology;  Laterality: N/A;   ESOPHAGOGASTRODUODENOSCOPY (EGD) WITH PROPOFOL  N/A 11/07/2020   Procedure: ESOPHAGOGASTRODUODENOSCOPY (EGD) WITH PROPOFOL ;   Surgeon: Wilhelmenia Aloha Raddle., MD;  Location: [facility] ENDOSCOPY;  Service: Gastroenterology;  Laterality: N/A;   ESOPHAGOGASTRODUODENOSCOPY (EGD) WITH PROPOFOL  N/A 12/20/2020   Procedure: ESOPHAGOGASTRODUODENOSCOPY (EGD) WITH PROPOFOL ;  Surgeon: Wilhelmenia Aloha Raddle., MD;  Location: [facility] END[facility]COPY;  Service: Gastroenterology;  Laterality: N/A;   ESOPHAGOGASTRODUODENOSCOPY (EGD) WITH PROPOFOL  N/A 06/26/2021  Procedure: ESOPHAGOGASTRODUODENOSCOPY (EGD) WITH PROPOFOL ;  Surgeon: Wilhelmenia Aloha Raddle., MD;  Location: [facility] ENDOSCOPY;  Service: Gastroenterology;  Laterality: N/A;   ESOPHAGOGASTRODUODENOSCOPY (EGD) WITH PROPOFOL  N/A 07/24/2021   Procedure: ESOPHAGOGASTRODUODENOSCOPY (EGD) WITH PROPOFOL ;  Surgeon: Wilhelmenia Aloha Raddle., MD;  Location: [facility] ENDOSCOPY;  Service: Gastroenterology;  Laterality: N/A;  AXIOS STENT   ESOPHAGOGASTRODUODENOSCOPY (EGD) WITH PROPOFOL  N/A 05/22/2022   Procedure: ESOPHAGOGASTRODUODENOSCOPY (EGD) WITH PROPOFOL ;  Surgeon: Wilhelmenia Aloha Raddle., MD;  Location: [facility];  Service: Gastroenterology;  Laterality: N/A;   ESOPHAGOGASTRODUODENOSCOPY (EGD) WITH PROPOFOL  N/A 06/16/2022   Procedure: ESOPHAGOGASTRODUODENOSCOPY (EGD) WITH PROPOFOL ;  Surgeon: Wilhelmenia Aloha Raddle., MD;  Location: [facility];  Service: Gastroenterology;  Laterality: N/A;   ESOPHAGOGASTRODUODENOSCOPY (EGD) WITH PROPOFOL  N/A 07/30/2022   Procedure: ESOPHAGOGASTRODUODENOSCOPY (EGD) WITH PROPOFOL ;  Surgeon: Wilhelmenia Aloha Raddle., MD;  Location: [facility];  Service: Gastroenterology;  Laterality: N/A;   ESOPHAGOGASTRODUODENOSCOPY (EGD) WITH PROPOFOL  N/A 03/30/2023   Procedure: ESOPHAGOGASTRODUODENOSCOPY (EGD) WITH PROPOFOL ;  Surgeon: Avram Lupita BRAVO, MD;  Location: [facility] ENDOSCOPY;  Service: Gastroenterology;  Laterality: N/A;   EUS  11/14/2020   Procedure: UPPER ENDOSCOPIC ULTRASOUND (EUS) LINEAR;  Surgeon: Wilhelmenia Aloha Raddle., MD;  Location: [facility] ENDOSCOPY;  Service: Gastroenterology;;   EUS  N/A 06/26/2021   Procedure: UPPER ENDOSCOPIC ULTRASOUND (EUS) RADIAL;  Surgeon: Wilhelmenia Aloha Raddle., MD;  Location: [facility] ENDOSCOPY;  Service: Gastroenterology;  Laterality: N/A;   EUS N/A 05/22/2022   Procedure: UPPER ENDOSCOPIC ULTRASOUND (EUS) LINEAR;  Surgeon: Wilhelmenia Aloha Raddle., MD;  Location: [facility] END[facility];  Service: Gastroenterology;  Laterality: N/A;   EUS N/A 06/16/2022   Procedure: UPPER ENDOSCOPIC ULTRASOUND (EUS) LINEAR;  Surgeon: Wilhelmenia Aloha Raddle., MD;  Location: [facility] ENDOSCOPY;  Service: Gastroenterology;  Laterality: N/A;   FINE NEEDLE ASPIRATION  05/22/2022   Procedure: FINE NEEDLE ASPIRATION;  Surgeon: Wilhelmenia Aloha Raddle., MD;  Location: [facility];  Service: Gastroenterology;;   GASTROINTESTINAL STENT REMOVAL  12/20/2020   Procedure: GASTROINTESTINAL STENT REMOVAL;  Surgeon: Wilhelmenia Aloha Raddle., MD;  Location: [facility] ENDOSCOPY;  Service: Gastroenterology;;  cyst gastrostomy stent and double pig tail stents x2 removed   HERNIA REPAIR Left 2002   groin   INGUINAL HERNIA REPAIR Left 04/05/2017   Procedure: RECURRENT HERNIA REPAIR INGUINAL ADULT WITH MESH;  Surgeon: Mavis Anes, MD;  Location: [facility];  Service: General;  Laterality: Left;   MASS EXCISION Right 04/29/2020   Procedure: EXCISION MASS RIGHT WRIST;  Surgeon: Murrell Drivers, MD;  Location: [facility];  Service: Orthopedics;  Laterality: Right;   MASS EXCISION Right 10/09/2020   Procedure: EXCISION MASS, ABDOMINAL WALL, 2CM;  Surgeon: Kallie Manuelita BROCKS, MD;  Location: [facility];  Service: General;  Laterality: Right;   PANCREATIC STENT PLACEMENT  06/26/2021   Procedure: AXIOS STENT PLACEMENT;  Surgeon: Wilhelmenia Aloha Raddle., MD;  Location: [facility] ENDOSCOPY;  Service: Gastroenterology;;   PANCREATIC STENT PLACEMENT  06/16/2022   Procedure: PANCREATIC STENT PLACEMENT;  Surgeon: Wilhelmenia Aloha Raddle., MD;  Location: [facility];  Service: Gastroenterology;;   REMOVAL OF STONES  12/20/2020    Procedure: REMOVAL OF STONES;  Surgeon: Wilhelmenia Aloha Raddle., MD;  Location: [facility];  Service: Gastroenterology;;   REMOVAL OF STONES  07/24/2021   Procedure: REMOVAL OF STONES;  Surgeon: Wilhelmenia Aloha Raddle., MD;  Location: [facility] ENDOSCOPY;  Service: Gastroenterology;;   REMOVAL OF STONES  05/22/2022   Procedure: REMOVAL OF STONES;  Surgeon: Wilhelmenia Aloha Raddle., MD;  Location: [facility];  Service: Gastroenterology;;   ANNETT N/A 10/10/2020   Procedure: ANNETT;  Surgeon: Golda,  Claudis PENNER, MD;  Location: [facility];  Service: Endoscopy;  Laterality: N/A;   SPHINCTEROTOMY  07/24/2021   Procedure: SPHINCTEROTOMY;  Surgeon: Mansouraty, Aloha Raddle., MD;  Location: [facility] ENDOSCOPY;  Service: Gastroenterology;;   SPLENECTOMY, TOTAL N/A 11/17/2022   Procedure: SPLENECTOMY;  Surgeon: Dasie Leonor CROME, MD;  Location: [facility] OR;  Service: General;  Laterality: N/A;   SPYGLASS CHOLANGIOSCOPY N/A 12/20/2020   Procedure: DEBHOJDD CHOLANGIOSCOPY;  Surgeon: Wilhelmenia Aloha Raddle., MD;  Location: [facility] END[facility];  Service: Gastroenterology;  Laterality: N/A;   SPYGLASS CHOLANGIOSCOPY N/A 07/24/2021   Procedure: SPYGLASS CHOLANGIOSCOPY;  Surgeon: Wilhelmenia Aloha Raddle., MD;  Location: [facility] ENDOSCOPY;  Service: Gastroenterology;  Laterality: N/A;   STENT REMOVAL  11/09/2020   Procedure: STENT REMOVAL;  Surgeon: Wilhelmenia Aloha Raddle., MD;  Location: [facility] ENDOSCOPY;  Service: Gastroenterology;;   CLEDA REMOVAL  11/11/2020   Procedure: STENT REMOVAL;  Surgeon: Wilhelmenia Aloha Raddle., MD;  Location: [facility] ENDOSCOPY;  Service: Gastroenterology;;   CLEDA REMOVAL  11/14/2020   Procedure: STENT REMOVAL;  Surgeon: Wilhelmenia Aloha Raddle., MD;  Location: [facility] ENDOSCOPY;  Service: Gastroenterology;;   CLEDA REMOVAL  12/20/2020   Procedure: STENT REMOVAL;  Surgeon: Wilhelmenia Aloha Raddle., MD;  Location: [facility] ENDOSCOPY;  Service: Gastroenterology;;  biliary x2   STENT REMOVAL  07/24/2021   Procedure: AXIOS STENT  REMOVAL;  Surgeon: Wilhelmenia Aloha Raddle., MD;  Location: [facility] ENDOSCOPY;  Service: Gastroenterology;;   CLEDA REMOVAL  07/30/2022   Procedure: STENT REMOVAL;  Surgeon: Wilhelmenia Aloha Raddle., MD;  Location: [facility] ENDOSCOPY;  Service: Gastroenterology;;   CLEDA REMOVAL  06/12/2024   Procedure: STENT REMOVAL;  Surgeon: Wilhelmenia Aloha Raddle., MD;  Location: [facility] ENDOSCOPY;  Service: Gastroenterology;;   UPPER ESOPHAGEAL ENDOSCOPIC ULTRASOUND (EUS) N/A 11/07/2020   Procedure: UPPER ESOPHAGEAL ENDOSCOPIC ULTRASOUND (EUS);  Surgeon: Wilhelmenia Aloha Raddle., MD;  Location: [facility] ENDOSCOPY;  Service: Gastroenterology;  Laterality: N/A;   UPPER GASTROINTESTINAL ENDOSCOPY     WOUND DEBRIDEMENT  11/09/2020   Procedure: CYST NECROSECTOMY;  Surgeon: Wilhelmenia Aloha Raddle., MD;  Location: [facility] ENDOSCOPY;  Service: Gastroenterology;;    Family History family history includes Diabetes in his father; Heart disease in his father and maternal grandmother; Stroke in his maternal grandfather.  Social History Social History   Socioeconomic History   Marital status: Single    Spouse name: Not on file   Number of children: 0   Years of education: Not on file   Highest education level: GED or equivalent  Occupational History   Not on file  Tobacco Use   Smoking status: Every Day    Current packs/day: 0.25    Average packs/day: 0.3 packs/day for 26.0 years (6.5 ttl pk-yrs)    Types: Cigarettes   Smokeless tobacco: Never   Tobacco comments:    4-5 cig daily as of 10/07/2020.  Vaping Use   Vaping status: Never Used  Substance and Sexual Activity   Alcohol use: No   Drug use: No   Sexual activity: Never    Birth control/protection: None  Other Topics Concern   Not on file  Social History Narrative   Not on file   Social Drivers of Health   Tobacco Use: High Risk (07/24/2024)   Patient History    Smoking Tobacco Use: Every Day    Smokeless Tobacco Use: Never    Passive Exposure: Not on file  Financial  Resource Strain: Low Risk (07/12/2024)   Overall Financial Resource Strain (CARDIA)    Difficulty of Paying Living Expenses: Not very hard  Food Insecurity: No Food Insecurity (07/12/2024)   Epic    Worried About Programme Researcher, Broadcasting/film/video in the Last Year: Never true    Ran Out of Food in the Last Year: Never true  Transportation Needs: No Transportation Needs (07/12/2024)   Epic    Lack of Transportation (Medical): No    Lack of Transportation (Non-Medical): No  Physical Activity: Insufficiently Active (07/12/2024)   Exercise Vital Sign    Days of Exercise per Week: 7 days    Minutes of Exercise per Session: 20 min  Stress: Stress Concern Present (07/12/2024)   [employer] of Occupational Health - Occupational Stress Questionnaire    Feeling of Stress: To some extent  Social Connections: Socially Isolated (07/12/2024)   Social Connection and Isolation Panel    Frequency of Communication with Friends and Family: Three times a week    Frequency of Social Gatherings with Friends and Family: Once a week    Attends Religious Services: Patient declined    Active Member of Clubs or Organizations: No    Attends Engineer, Structural: Not on file    Marital Status: Divorced  Intimate Partner Violence: Not At Risk (08/16/2023)   Humiliation, Afraid, Rape, and Kick questionnaire    Fear of Current or Ex-Partner: No    Emotionally Abused: No    Physically Abused: No    Sexually Abused: No  Depression (PHQ2-9): Medium Risk (03/29/2024)   Depression (PHQ2-9)    PHQ-2 Score: 7  Alcohol Screen: Not on file  Housing: Unknown (07/12/2024)   Epic    Unable to Pay for Housing in the Last Year: No    Number of Times Moved in the Last Year: Not on file    Homeless in the Last Year: No  Utilities: Not At Risk (08/16/2023)   [employer] Utilities    Threatened with loss of utilities: No  Health Literacy: Not on file    Lab Results  Component Value Date   HGBA1C 10.5 (A) 05/09/2024   HGBA1C 11.3 (A)  02/01/2024   HGBA1C 10.5 (A) 11/16/2023   Lab Results  Component Value Date   CHOL 158 09/14/2023   Lab Results  Component Value Date   HDL 44 09/14/2023   Lab Results  Component Value Date   LDLCALC 98 09/14/2023   Lab Results  Component Value Date   TRIG 72 09/14/2023   Lab Results  Component Value Date   CHOLHDL 3.6 09/14/2023   Lab Results  Component Value Date   CREATININE 1.00 06/27/2024   Lab Results  Component Value Date   GFR 99.32 12/02/2023   Lab Results  Component Value Date   MICROALBUR 0.5 09/14/2023      Component Value Date/Time   NA 139 12/02/2023 0811   NA 135 08/18/2023 1205   K 4.4 12/02/2023 0811   CL 102 12/02/2023 0811   CO2 29 12/02/2023 0811   GLUCOSE 188 (H) 12/02/2023 0811   BUN 15 12/02/2023 0811   BUN 15 08/18/2023 1205   CREATININE 1.00 06/27/2024 0738   CREATININE 0.79 09/14/2023 0940   CALCIUM  9.2 12/02/2023 0811   PROT 6.7 12/02/2023 0811   PROT 5.7 (L) 08/18/2023 1205   ALBUMIN  4.2 12/02/2023 0811   ALBUMIN  3.7 (L) 08/18/2023 1205   AST 19 12/02/2023 0811   ALT 29 12/02/2023 0811   ALKPHOS 130 (H) 12/02/2023 0811   BILITOT 0.4 12/02/2023 0811   BILITOT <0.2 08/18/2023 1205   GFRNONAA >60 08/13/2023 0450  GFRNONAA 89 04/22/2015 0828   GFRAA 85 10/04/2019 1433   GFRAA >89 04/22/2015 0828      Latest Ref Rng & Units 06/27/2024    7:38 AM 12/02/2023    8:11 AM 09/14/2023    9:40 AM  BMP  Glucose 70 - 99 mg/dL  811  697   BUN 6 - 23 mg/dL  15  16   Creatinine 9.38 - 1.24 mg/dL 8.99  9.13  9.20   BUN/Creat Ratio 6 - 22 (calc)   SEE NOTE:   Sodium 135 - 145 mEq/L  139  136   Potassium 3.5 - 5.1 mEq/L  4.4  4.8   Chloride 96 - 112 mEq/L  102  98   CO2 19 - 32 mEq/L  29  32   Calcium  8.4 - 10.5 mg/dL  9.2  9.7        Component Value Date/Time   WBC 14.1 (H) 12/02/2023 0811   RBC 5.12 12/02/2023 0811   HGB 15.9 12/02/2023 0811   HGB 13.8 08/18/2023 1205   HCT 46.8 12/02/2023 0811   HCT 41.0 08/18/2023 1205    PLT 429.0 (H) 12/02/2023 0811   PLT 484 (H) 08/18/2023 1205   MCV 91.4 12/02/2023 0811   MCV 94 08/18/2023 1205   MCH 31.7 08/18/2023 1205   MCH 31.7 08/12/2023 0257   MCHC 34.1 12/02/2023 0811   RDW 13.9 12/02/2023 0811   RDW 12.4 08/18/2023 1205   LYMPHSABS 3.7 (H) 08/18/2023 1205   MONOABS 0.7 07/07/2023 1310   EOSABS 0.1 08/18/2023 1205   BASOSABS 0.1 08/18/2023 1205     Parts of this note may have been dictated using voice recognition software. There may be variances in spelling and vocabulary which are unintentional. Not all errors are proofread. Please notify the dino if any discrepancies are noted or if the meaning of any statement is not clear.    "

## 2024-07-24 NOTE — Patient Instructions (Addendum)
 Will recommend the following: Synjardy  XR 11/998 mg qd  Lantus  45 units qam to target fasting blood sugar at 100-150 Novolog  10 units tid 15 min before meals Correction scale: Use in addition to your meal time/short acting insulin  (both together) based on blood sugars as follows:  151 - 180: 1 unit 181 - 210: 2 units 211 - 240: 3 units 241 - 270: 4 units 271 - 300: 5 units 301 - 330: 6 units 331 - 360: 7 units 361 - 390: 8 units 391 - 420/High : 9 units   Cut down short acting insulin  to half if eating only half a meal or if blood sugar is between 71-100 before a meal Skip short acting insulin  if blood sugar is less than 70 and treat with 15 gms of carbohydrates every 15 min until blood sugar is more than 100   __________    Goals of DM therapy:  Morning Fasting blood sugar: 80-140  Blood sugar before meals: 80-140 Bed time blood sugar: 100-150  A1C <7%, limited only by hypoglycemia  1.Diabetes medications and their side effects discussed, including hypoglycemia    2. Check blood glucose:  a) Always check blood sugars before driving. Please see below (under hypoglycemia) on how to manage b) Check a minimum of 3 times/day or more as needed when having symptoms of hypoglycemia.   c) Try to check blood glucose before sleeping/in the middle of the night to ensure that it is remaining stable and not dropping less than 100 d) Check blood glucose more often if sick  3. Diet: a) 3 meals per day schedule b: Restrict carbs to 60-70 grams (4 servings) per meal c) Colorful vegetables - 3 servings a day, and low sugar fruit 2 servings/day Plate control method: 1/4 plate protein, 1/4 starch, 1/2 green, yellow, or red vegetables d) Avoid carbohydrate snacks unless hypoglycemic episode, or increased physical activity  4. Regular exercise as tolerated, preferably 3 or more hours a week  5. Hypoglycemia: a)  Do not drive or operate machinery without first testing blood glucose to assure  it is over 90 mg%, or if dizzy, lightheaded, not feeling normal, etc, or  if foot or leg is numb or weak. b)  If blood glucose less than 70, take four 5gm Glucose tabs or 15-30 gm Glucose gel.  Repeat every 15 min as needed until blood sugar is >100 mg/dl. If hypoglycemia persists then call 911.   6. Sick day management: a) Check blood glucose more often b) Continue usual therapy if blood sugars are elevated.   7. Contact the doctor immediately if blood glucose is frequently <60 mg/dl, or an episode of severe hypoglycemia occurs (where someone had to give you glucose/  glucagon  or if you passed out from a low blood glucose), or if blood glucose is persistently >350 mg/dl, for further management  8. A change in level of physical activity or exercise and a change in diet may also affect your blood sugar. Check blood sugars more often and call if needed.  Instructions: 1. Bring glucose meter, blood glucose records on every visit for review 2. Continue to follow up with primary care physician and other providers for medical care 3. Yearly eye  and foot exam 4. Please get blood work done prior to the next appointment

## 2024-07-25 ENCOUNTER — Encounter: Payer: Self-pay | Admitting: "Endocrinology

## 2024-07-26 ENCOUNTER — Encounter: Payer: Self-pay | Admitting: "Endocrinology

## 2024-08-15 ENCOUNTER — Other Ambulatory Visit: Payer: MEDICAID

## 2024-08-22 ENCOUNTER — Ambulatory Visit: Payer: MEDICAID | Admitting: "Endocrinology

## 2024-10-17 ENCOUNTER — Ambulatory Visit: Payer: MEDICAID | Admitting: Family Medicine
